# Patient Record
Sex: Female | Born: 1935
Health system: Southern US, Community
[De-identification: ages and names within clinical notes are randomized; demographics above are authoritative.]

## PROBLEM LIST (undated history)

## (undated) DIAGNOSIS — M549 Dorsalgia, unspecified: Secondary | ICD-10-CM

## (undated) DIAGNOSIS — G709 Myoneural disorder, unspecified: Secondary | ICD-10-CM

## (undated) DIAGNOSIS — I3139 Other pericardial effusion (noninflammatory): Secondary | ICD-10-CM

## (undated) DIAGNOSIS — I4891 Unspecified atrial fibrillation: Secondary | ICD-10-CM

## (undated) DIAGNOSIS — I10 Essential (primary) hypertension: Secondary | ICD-10-CM

## (undated) DIAGNOSIS — I313 Pericardial effusion (noninflammatory): Secondary | ICD-10-CM

## (undated) DIAGNOSIS — D649 Anemia, unspecified: Secondary | ICD-10-CM

## (undated) DIAGNOSIS — H9190 Unspecified hearing loss, unspecified ear: Secondary | ICD-10-CM

## (undated) DIAGNOSIS — I2510 Atherosclerotic heart disease of native coronary artery without angina pectoris: Secondary | ICD-10-CM

## (undated) DIAGNOSIS — F419 Anxiety disorder, unspecified: Secondary | ICD-10-CM

## (undated) DIAGNOSIS — K219 Gastro-esophageal reflux disease without esophagitis: Secondary | ICD-10-CM

## (undated) DIAGNOSIS — G8929 Other chronic pain: Secondary | ICD-10-CM

## (undated) DIAGNOSIS — E785 Hyperlipidemia, unspecified: Secondary | ICD-10-CM

## (undated) DIAGNOSIS — Z950 Presence of cardiac pacemaker: Secondary | ICD-10-CM

## (undated) DIAGNOSIS — Z8719 Personal history of other diseases of the digestive system: Secondary | ICD-10-CM

## (undated) DIAGNOSIS — I495 Sick sinus syndrome: Secondary | ICD-10-CM

## (undated) DIAGNOSIS — J449 Chronic obstructive pulmonary disease, unspecified: Secondary | ICD-10-CM

## (undated) DIAGNOSIS — R51 Headache: Secondary | ICD-10-CM

## (undated) DIAGNOSIS — I241 Dressler's syndrome: Secondary | ICD-10-CM

## (undated) HISTORY — DX: Chronic obstructive pulmonary disease, unspecified: J44.9

## (undated) HISTORY — DX: Other chronic pain: G89.29

## (undated) HISTORY — DX: Anxiety disorder, unspecified: F41.9

## (undated) HISTORY — DX: Dressler's syndrome: I24.1

## (undated) HISTORY — DX: Atherosclerotic heart disease of native coronary artery without angina pectoris: I25.10

## (undated) HISTORY — DX: Pericardial effusion (noninflammatory): I31.3

## (undated) HISTORY — DX: Sick sinus syndrome: I49.5

## (undated) HISTORY — DX: Other pericardial effusion (noninflammatory): I31.39

## (undated) HISTORY — DX: Unspecified atrial fibrillation: I48.91

## (undated) HISTORY — PX: CHOLECYSTECTOMY: SHX55

## (undated) HISTORY — DX: Gastro-esophageal reflux disease without esophagitis: K21.9

## (undated) HISTORY — PX: VAGINAL HYSTERECTOMY: SUR661

## (undated) HISTORY — PX: APPENDECTOMY: SHX54

## (undated) HISTORY — DX: Essential (primary) hypertension: I10

## (undated) HISTORY — DX: Hyperlipidemia, unspecified: E78.5

## (undated) HISTORY — DX: Other chronic pain: M54.9

## (undated) HISTORY — PX: CATARACT EXTRACTION: SUR2

---

## 2001-03-26 ENCOUNTER — Encounter: Payer: Self-pay | Admitting: Family Medicine

## 2001-03-26 ENCOUNTER — Ambulatory Visit (HOSPITAL_COMMUNITY): Admission: RE | Admit: 2001-03-26 | Discharge: 2001-03-26 | Payer: Self-pay | Admitting: Family Medicine

## 2001-12-29 ENCOUNTER — Ambulatory Visit (HOSPITAL_COMMUNITY): Admission: RE | Admit: 2001-12-29 | Discharge: 2001-12-29 | Payer: Self-pay | Admitting: Internal Medicine

## 2002-04-28 ENCOUNTER — Ambulatory Visit (HOSPITAL_COMMUNITY): Admission: RE | Admit: 2002-04-28 | Discharge: 2002-04-28 | Payer: Self-pay | Admitting: Family Medicine

## 2002-04-28 ENCOUNTER — Encounter: Payer: Self-pay | Admitting: Family Medicine

## 2002-05-25 ENCOUNTER — Ambulatory Visit (HOSPITAL_COMMUNITY): Admission: RE | Admit: 2002-05-25 | Discharge: 2002-05-25 | Payer: Self-pay | Admitting: Cardiology

## 2002-08-15 ENCOUNTER — Emergency Department (HOSPITAL_COMMUNITY): Admission: EM | Admit: 2002-08-15 | Discharge: 2002-08-15 | Payer: Self-pay

## 2003-05-10 ENCOUNTER — Ambulatory Visit (HOSPITAL_COMMUNITY): Admission: RE | Admit: 2003-05-10 | Discharge: 2003-05-10 | Payer: Self-pay | Admitting: Internal Medicine

## 2003-06-14 ENCOUNTER — Ambulatory Visit (HOSPITAL_COMMUNITY): Admission: RE | Admit: 2003-06-14 | Discharge: 2003-06-14 | Payer: Self-pay | Admitting: Internal Medicine

## 2003-11-27 ENCOUNTER — Ambulatory Visit (HOSPITAL_COMMUNITY): Admission: RE | Admit: 2003-11-27 | Discharge: 2003-11-27 | Payer: Self-pay | Admitting: Internal Medicine

## 2004-06-10 ENCOUNTER — Inpatient Hospital Stay (HOSPITAL_COMMUNITY): Admission: AD | Admit: 2004-06-10 | Discharge: 2004-06-12 | Payer: Self-pay | Admitting: Family Medicine

## 2004-06-11 ENCOUNTER — Ambulatory Visit: Payer: Self-pay | Admitting: *Deleted

## 2004-06-26 ENCOUNTER — Ambulatory Visit: Payer: Self-pay | Admitting: *Deleted

## 2004-07-02 ENCOUNTER — Ambulatory Visit: Payer: Self-pay | Admitting: *Deleted

## 2004-07-03 ENCOUNTER — Ambulatory Visit: Payer: Self-pay | Admitting: *Deleted

## 2004-07-04 ENCOUNTER — Ambulatory Visit (HOSPITAL_COMMUNITY): Admission: RE | Admit: 2004-07-04 | Discharge: 2004-07-04 | Payer: Self-pay | Admitting: Family Medicine

## 2004-07-05 ENCOUNTER — Ambulatory Visit: Payer: Self-pay | Admitting: Internal Medicine

## 2004-07-10 ENCOUNTER — Ambulatory Visit (HOSPITAL_COMMUNITY): Admission: RE | Admit: 2004-07-10 | Discharge: 2004-07-10 | Payer: Self-pay | Admitting: Internal Medicine

## 2004-08-22 ENCOUNTER — Encounter
Admission: RE | Admit: 2004-08-22 | Discharge: 2004-11-20 | Payer: Self-pay | Admitting: Physical Medicine & Rehabilitation

## 2004-08-26 ENCOUNTER — Ambulatory Visit: Payer: Self-pay | Admitting: Physical Medicine & Rehabilitation

## 2004-08-26 ENCOUNTER — Ambulatory Visit: Payer: Self-pay | Admitting: *Deleted

## 2004-09-05 ENCOUNTER — Ambulatory Visit: Payer: Self-pay | Admitting: Internal Medicine

## 2004-09-25 ENCOUNTER — Ambulatory Visit (HOSPITAL_COMMUNITY): Admission: RE | Admit: 2004-09-25 | Discharge: 2004-09-25 | Payer: Self-pay | Admitting: Neurology

## 2004-10-11 ENCOUNTER — Ambulatory Visit (HOSPITAL_COMMUNITY): Admission: RE | Admit: 2004-10-11 | Discharge: 2004-10-11 | Payer: Self-pay | Admitting: Family Medicine

## 2004-10-14 ENCOUNTER — Ambulatory Visit (HOSPITAL_COMMUNITY): Admission: RE | Admit: 2004-10-14 | Discharge: 2004-10-14 | Payer: Self-pay | Admitting: Internal Medicine

## 2004-10-14 ENCOUNTER — Ambulatory Visit: Payer: Self-pay | Admitting: Internal Medicine

## 2004-10-30 ENCOUNTER — Emergency Department (HOSPITAL_COMMUNITY): Admission: EM | Admit: 2004-10-30 | Discharge: 2004-10-30 | Payer: Self-pay | Admitting: Emergency Medicine

## 2004-11-20 ENCOUNTER — Encounter
Admission: RE | Admit: 2004-11-20 | Discharge: 2005-02-18 | Payer: Self-pay | Admitting: Physical Medicine & Rehabilitation

## 2004-11-22 ENCOUNTER — Ambulatory Visit: Payer: Self-pay | Admitting: Physical Medicine & Rehabilitation

## 2004-12-16 ENCOUNTER — Ambulatory Visit (HOSPITAL_COMMUNITY): Admission: RE | Admit: 2004-12-16 | Discharge: 2004-12-16 | Payer: Self-pay | Admitting: Family Medicine

## 2004-12-23 ENCOUNTER — Ambulatory Visit (HOSPITAL_COMMUNITY): Admission: RE | Admit: 2004-12-23 | Discharge: 2004-12-23 | Payer: Self-pay | Admitting: Family Medicine

## 2004-12-23 ENCOUNTER — Encounter: Payer: Self-pay | Admitting: Orthopedic Surgery

## 2005-01-09 ENCOUNTER — Ambulatory Visit: Payer: Self-pay | Admitting: Internal Medicine

## 2005-01-20 ENCOUNTER — Ambulatory Visit (HOSPITAL_COMMUNITY): Admission: RE | Admit: 2005-01-20 | Discharge: 2005-01-20 | Payer: Self-pay | Admitting: Internal Medicine

## 2005-01-20 ENCOUNTER — Ambulatory Visit: Payer: Self-pay | Admitting: Internal Medicine

## 2005-03-07 ENCOUNTER — Ambulatory Visit: Payer: Self-pay | Admitting: *Deleted

## 2005-03-11 ENCOUNTER — Ambulatory Visit: Payer: Self-pay | Admitting: Internal Medicine

## 2005-03-11 ENCOUNTER — Ambulatory Visit (HOSPITAL_COMMUNITY): Admission: RE | Admit: 2005-03-11 | Discharge: 2005-03-11 | Payer: Self-pay | Admitting: Family Medicine

## 2005-03-12 ENCOUNTER — Emergency Department (HOSPITAL_COMMUNITY): Admission: EM | Admit: 2005-03-12 | Discharge: 2005-03-12 | Payer: Self-pay | Admitting: Emergency Medicine

## 2005-03-12 ENCOUNTER — Ambulatory Visit (HOSPITAL_COMMUNITY): Admission: RE | Admit: 2005-03-12 | Discharge: 2005-03-12 | Payer: Self-pay | Admitting: Internal Medicine

## 2005-04-18 ENCOUNTER — Ambulatory Visit (HOSPITAL_COMMUNITY): Admission: RE | Admit: 2005-04-18 | Discharge: 2005-04-18 | Payer: Self-pay | Admitting: Family Medicine

## 2005-05-05 ENCOUNTER — Ambulatory Visit: Payer: Self-pay | Admitting: Internal Medicine

## 2005-07-10 ENCOUNTER — Ambulatory Visit: Payer: Self-pay | Admitting: Internal Medicine

## 2005-07-23 ENCOUNTER — Ambulatory Visit: Payer: Self-pay | Admitting: Internal Medicine

## 2005-08-05 ENCOUNTER — Ambulatory Visit: Payer: Self-pay | Admitting: *Deleted

## 2005-08-07 ENCOUNTER — Ambulatory Visit: Payer: Self-pay | Admitting: *Deleted

## 2005-08-07 ENCOUNTER — Encounter (HOSPITAL_COMMUNITY): Admission: RE | Admit: 2005-08-07 | Discharge: 2005-09-06 | Payer: Self-pay | Admitting: *Deleted

## 2005-08-13 ENCOUNTER — Ambulatory Visit: Payer: Self-pay | Admitting: *Deleted

## 2005-10-21 ENCOUNTER — Ambulatory Visit (HOSPITAL_COMMUNITY): Admission: RE | Admit: 2005-10-21 | Discharge: 2005-10-21 | Payer: Self-pay | Admitting: Family Medicine

## 2005-12-23 ENCOUNTER — Ambulatory Visit: Payer: Self-pay | Admitting: Internal Medicine

## 2006-01-27 ENCOUNTER — Ambulatory Visit (HOSPITAL_COMMUNITY): Admission: RE | Admit: 2006-01-27 | Discharge: 2006-01-27 | Payer: Self-pay | Admitting: Internal Medicine

## 2006-01-30 ENCOUNTER — Ambulatory Visit: Payer: Self-pay | Admitting: *Deleted

## 2006-02-03 ENCOUNTER — Inpatient Hospital Stay (HOSPITAL_BASED_OUTPATIENT_CLINIC_OR_DEPARTMENT_OTHER): Admission: RE | Admit: 2006-02-03 | Discharge: 2006-02-03 | Payer: Self-pay | Admitting: Cardiology

## 2006-02-03 ENCOUNTER — Ambulatory Visit: Payer: Self-pay | Admitting: Cardiology

## 2006-02-19 ENCOUNTER — Ambulatory Visit: Payer: Self-pay | Admitting: *Deleted

## 2006-02-20 ENCOUNTER — Ambulatory Visit: Payer: Self-pay | Admitting: *Deleted

## 2006-02-20 ENCOUNTER — Ambulatory Visit (HOSPITAL_COMMUNITY): Admission: RE | Admit: 2006-02-20 | Discharge: 2006-02-20 | Payer: Self-pay | Admitting: *Deleted

## 2006-03-05 ENCOUNTER — Ambulatory Visit: Payer: Self-pay | Admitting: *Deleted

## 2006-03-26 ENCOUNTER — Ambulatory Visit: Payer: Self-pay | Admitting: Internal Medicine

## 2006-04-07 ENCOUNTER — Ambulatory Visit (HOSPITAL_COMMUNITY): Admission: RE | Admit: 2006-04-07 | Discharge: 2006-04-07 | Payer: Self-pay | Admitting: Internal Medicine

## 2006-04-07 ENCOUNTER — Ambulatory Visit: Payer: Self-pay | Admitting: Internal Medicine

## 2006-04-29 ENCOUNTER — Ambulatory Visit: Payer: Self-pay | Admitting: Cardiology

## 2006-06-12 ENCOUNTER — Ambulatory Visit (HOSPITAL_COMMUNITY): Admission: RE | Admit: 2006-06-12 | Discharge: 2006-06-12 | Payer: Self-pay | Admitting: Internal Medicine

## 2006-07-08 ENCOUNTER — Ambulatory Visit (HOSPITAL_COMMUNITY): Admission: RE | Admit: 2006-07-08 | Discharge: 2006-07-08 | Payer: Self-pay | Admitting: Pulmonary Disease

## 2006-07-09 ENCOUNTER — Ambulatory Visit: Payer: Self-pay | Admitting: Internal Medicine

## 2006-08-06 ENCOUNTER — Ambulatory Visit (HOSPITAL_COMMUNITY): Admission: RE | Admit: 2006-08-06 | Discharge: 2006-08-06 | Payer: Self-pay | Admitting: Ophthalmology

## 2006-09-24 ENCOUNTER — Ambulatory Visit: Payer: Self-pay | Admitting: Cardiology

## 2006-10-07 ENCOUNTER — Encounter: Payer: Self-pay | Admitting: Cardiology

## 2006-10-07 ENCOUNTER — Ambulatory Visit: Payer: Self-pay | Admitting: Cardiology

## 2006-10-14 ENCOUNTER — Ambulatory Visit: Payer: Self-pay | Admitting: Cardiology

## 2007-01-07 ENCOUNTER — Ambulatory Visit: Payer: Self-pay | Admitting: Internal Medicine

## 2007-02-08 ENCOUNTER — Ambulatory Visit (HOSPITAL_COMMUNITY): Admission: RE | Admit: 2007-02-08 | Discharge: 2007-02-08 | Payer: Self-pay | Admitting: Internal Medicine

## 2007-02-17 ENCOUNTER — Ambulatory Visit: Payer: Self-pay | Admitting: Cardiology

## 2007-04-01 ENCOUNTER — Ambulatory Visit (HOSPITAL_COMMUNITY): Admission: RE | Admit: 2007-04-01 | Discharge: 2007-04-01 | Payer: Self-pay | Admitting: Family Medicine

## 2007-04-12 ENCOUNTER — Ambulatory Visit: Payer: Self-pay | Admitting: Cardiology

## 2007-07-21 ENCOUNTER — Emergency Department (HOSPITAL_COMMUNITY): Admission: EM | Admit: 2007-07-21 | Discharge: 2007-07-21 | Payer: Self-pay | Admitting: Emergency Medicine

## 2007-09-01 ENCOUNTER — Ambulatory Visit (HOSPITAL_COMMUNITY): Admission: RE | Admit: 2007-09-01 | Discharge: 2007-09-01 | Payer: Self-pay | Admitting: Family Medicine

## 2007-09-23 ENCOUNTER — Ambulatory Visit (HOSPITAL_COMMUNITY): Admission: RE | Admit: 2007-09-23 | Discharge: 2007-09-23 | Payer: Self-pay | Admitting: Internal Medicine

## 2007-10-08 ENCOUNTER — Ambulatory Visit: Payer: Self-pay | Admitting: Cardiology

## 2008-02-21 ENCOUNTER — Ambulatory Visit: Payer: Self-pay

## 2008-03-15 ENCOUNTER — Ambulatory Visit: Payer: Self-pay | Admitting: Cardiology

## 2008-08-04 HISTORY — PX: INSERT / REPLACE / REMOVE PACEMAKER: SUR710

## 2008-08-04 HISTORY — PX: CARDIAC CATHETERIZATION: SHX172

## 2008-08-28 ENCOUNTER — Ambulatory Visit (HOSPITAL_COMMUNITY): Admission: RE | Admit: 2008-08-28 | Discharge: 2008-08-28 | Payer: Self-pay | Admitting: Internal Medicine

## 2008-09-21 ENCOUNTER — Ambulatory Visit: Payer: Self-pay | Admitting: Cardiology

## 2008-09-28 ENCOUNTER — Encounter (INDEPENDENT_AMBULATORY_CARE_PROVIDER_SITE_OTHER): Payer: Self-pay | Admitting: *Deleted

## 2008-09-28 LAB — CONVERTED CEMR LAB
Albumin: 4.3 g/dL
Alkaline Phosphatase: 97 units/L
Cholesterol: 258 mg/dL
HDL: 43 mg/dL
Total Protein: 7.6 g/dL
Triglycerides: 115 mg/dL

## 2008-10-13 ENCOUNTER — Ambulatory Visit: Payer: Self-pay | Admitting: Cardiology

## 2008-10-20 ENCOUNTER — Ambulatory Visit: Payer: Self-pay | Admitting: Cardiology

## 2008-11-28 ENCOUNTER — Inpatient Hospital Stay (HOSPITAL_COMMUNITY): Admission: EM | Admit: 2008-11-28 | Discharge: 2008-11-30 | Payer: Self-pay | Admitting: Emergency Medicine

## 2008-11-28 ENCOUNTER — Ambulatory Visit: Payer: Self-pay | Admitting: Cardiology

## 2008-11-29 ENCOUNTER — Encounter (INDEPENDENT_AMBULATORY_CARE_PROVIDER_SITE_OTHER): Payer: Self-pay | Admitting: Internal Medicine

## 2008-12-04 ENCOUNTER — Ambulatory Visit: Payer: Self-pay | Admitting: Cardiology

## 2008-12-08 ENCOUNTER — Ambulatory Visit: Payer: Self-pay | Admitting: Cardiology

## 2008-12-08 ENCOUNTER — Encounter: Payer: Self-pay | Admitting: Physician Assistant

## 2008-12-10 ENCOUNTER — Emergency Department (HOSPITAL_COMMUNITY): Admission: EM | Admit: 2008-12-10 | Discharge: 2008-12-10 | Payer: Self-pay | Admitting: Emergency Medicine

## 2008-12-19 DIAGNOSIS — I4819 Other persistent atrial fibrillation: Secondary | ICD-10-CM | POA: Insufficient documentation

## 2008-12-19 DIAGNOSIS — E7849 Other hyperlipidemia: Secondary | ICD-10-CM | POA: Insufficient documentation

## 2008-12-19 DIAGNOSIS — I4891 Unspecified atrial fibrillation: Secondary | ICD-10-CM | POA: Insufficient documentation

## 2008-12-19 DIAGNOSIS — E782 Mixed hyperlipidemia: Secondary | ICD-10-CM | POA: Insufficient documentation

## 2008-12-19 DIAGNOSIS — E785 Hyperlipidemia, unspecified: Secondary | ICD-10-CM

## 2008-12-19 DIAGNOSIS — I482 Chronic atrial fibrillation, unspecified: Secondary | ICD-10-CM | POA: Insufficient documentation

## 2008-12-19 DIAGNOSIS — F419 Anxiety disorder, unspecified: Secondary | ICD-10-CM | POA: Insufficient documentation

## 2008-12-19 DIAGNOSIS — F411 Generalized anxiety disorder: Secondary | ICD-10-CM

## 2008-12-19 DIAGNOSIS — K219 Gastro-esophageal reflux disease without esophagitis: Secondary | ICD-10-CM

## 2008-12-19 DIAGNOSIS — R259 Unspecified abnormal involuntary movements: Secondary | ICD-10-CM | POA: Insufficient documentation

## 2008-12-21 ENCOUNTER — Ambulatory Visit: Payer: Self-pay | Admitting: Internal Medicine

## 2008-12-22 LAB — CONVERTED CEMR LAB
BUN: 10 mg/dL (ref 6–23)
Basophils Absolute: 0 10*3/uL (ref 0.0–0.1)
Basophils Relative: 0.7 % (ref 0.0–3.0)
CO2: 30 meq/L (ref 19–32)
Calcium: 9.4 mg/dL (ref 8.4–10.5)
Chloride: 103 meq/L (ref 96–112)
Creatinine, Ser: 0.7 mg/dL (ref 0.4–1.2)
Eosinophils Absolute: 0.1 10*3/uL (ref 0.0–0.7)
Eosinophils Relative: 1.1 % (ref 0.0–5.0)
GFR calc non Af Amer: 87.62 mL/min (ref >60)
Glucose, Bld: 103 mg/dL — ABNORMAL HIGH (ref 70–99)
HCT: 35.8 % — ABNORMAL LOW (ref 36.0–46.0)
Hemoglobin: 12.2 g/dL (ref 12.0–15.0)
INR: 1.1 — ABNORMAL HIGH (ref 0.8–1.0)
Lymphocytes Relative: 23.8 % (ref 12.0–46.0)
Lymphs Abs: 1.6 10*3/uL (ref 0.7–4.0)
MCHC: 34.2 g/dL (ref 30.0–36.0)
MCV: 88.9 fL (ref 78.0–100.0)
Monocytes Absolute: 0.6 10*3/uL (ref 0.1–1.0)
Monocytes Relative: 9.5 % (ref 3.0–12.0)
Neutro Abs: 4.5 10*3/uL (ref 1.4–7.7)
Neutrophils Relative %: 64.9 % (ref 43.0–77.0)
Platelets: 227 10*3/uL (ref 150.0–400.0)
Potassium: 3.8 meq/L (ref 3.5–5.1)
Prothrombin Time: 11.8 s (ref 10.9–13.3)
RBC: 4.02 M/uL (ref 3.87–5.11)
RDW: 12.4 % (ref 11.5–14.6)
Sodium: 139 meq/L (ref 135–145)
WBC: 6.8 10*3/uL (ref 4.5–10.5)
aPTT: 29.6 s — ABNORMAL HIGH (ref 21.7–28.8)

## 2008-12-27 ENCOUNTER — Observation Stay (HOSPITAL_COMMUNITY): Admission: RE | Admit: 2008-12-27 | Discharge: 2008-12-28 | Payer: Self-pay | Admitting: Internal Medicine

## 2008-12-27 ENCOUNTER — Ambulatory Visit: Payer: Self-pay | Admitting: Internal Medicine

## 2008-12-28 ENCOUNTER — Encounter: Payer: Self-pay | Admitting: Internal Medicine

## 2009-01-04 ENCOUNTER — Encounter: Payer: Self-pay | Admitting: Physician Assistant

## 2009-01-04 ENCOUNTER — Encounter: Payer: Self-pay | Admitting: Cardiology

## 2009-01-04 ENCOUNTER — Ambulatory Visit: Payer: Self-pay | Admitting: Cardiology

## 2009-01-04 LAB — CONVERTED CEMR LAB
CO2: 25 meq/L
Chloride: 103 meq/L
MCV: 90.9 fL
Platelets: 240 10*3/uL
Sodium: 141 meq/L

## 2009-01-05 ENCOUNTER — Emergency Department (HOSPITAL_COMMUNITY): Admission: EM | Admit: 2009-01-05 | Discharge: 2009-01-05 | Payer: Self-pay | Admitting: Emergency Medicine

## 2009-01-05 ENCOUNTER — Encounter: Payer: Self-pay | Admitting: Internal Medicine

## 2009-01-12 ENCOUNTER — Encounter: Payer: Self-pay | Admitting: Internal Medicine

## 2009-01-12 ENCOUNTER — Ambulatory Visit: Payer: Self-pay | Admitting: Cardiology

## 2009-01-17 ENCOUNTER — Emergency Department (HOSPITAL_COMMUNITY): Admission: EM | Admit: 2009-01-17 | Discharge: 2009-01-17 | Payer: Self-pay | Admitting: Internal Medicine

## 2009-01-26 ENCOUNTER — Encounter: Payer: Self-pay | Admitting: Cardiology

## 2009-01-26 ENCOUNTER — Ambulatory Visit (HOSPITAL_COMMUNITY): Admission: RE | Admit: 2009-01-26 | Discharge: 2009-01-26 | Payer: Self-pay | Admitting: Internal Medicine

## 2009-01-26 ENCOUNTER — Ambulatory Visit: Payer: Self-pay | Admitting: Cardiology

## 2009-01-26 DIAGNOSIS — I495 Sick sinus syndrome: Secondary | ICD-10-CM | POA: Insufficient documentation

## 2009-03-12 ENCOUNTER — Telehealth (INDEPENDENT_AMBULATORY_CARE_PROVIDER_SITE_OTHER): Payer: Self-pay | Admitting: *Deleted

## 2009-03-16 ENCOUNTER — Encounter: Payer: Self-pay | Admitting: Internal Medicine

## 2009-03-16 ENCOUNTER — Ambulatory Visit: Payer: Self-pay

## 2009-03-30 ENCOUNTER — Ambulatory Visit (HOSPITAL_COMMUNITY): Admission: RE | Admit: 2009-03-30 | Discharge: 2009-03-30 | Payer: Self-pay | Admitting: Internal Medicine

## 2009-04-04 ENCOUNTER — Ambulatory Visit: Payer: Self-pay | Admitting: Internal Medicine

## 2009-04-04 DIAGNOSIS — Z950 Presence of cardiac pacemaker: Secondary | ICD-10-CM

## 2009-04-18 ENCOUNTER — Ambulatory Visit: Payer: Self-pay | Admitting: Cardiology

## 2009-04-18 DIAGNOSIS — I2510 Atherosclerotic heart disease of native coronary artery without angina pectoris: Secondary | ICD-10-CM | POA: Insufficient documentation

## 2009-04-18 DIAGNOSIS — I25110 Atherosclerotic heart disease of native coronary artery with unstable angina pectoris: Secondary | ICD-10-CM | POA: Insufficient documentation

## 2009-04-18 DIAGNOSIS — I10 Essential (primary) hypertension: Secondary | ICD-10-CM | POA: Insufficient documentation

## 2009-04-18 DIAGNOSIS — I25119 Atherosclerotic heart disease of native coronary artery with unspecified angina pectoris: Secondary | ICD-10-CM

## 2009-05-02 ENCOUNTER — Telehealth (INDEPENDENT_AMBULATORY_CARE_PROVIDER_SITE_OTHER): Payer: Self-pay

## 2009-05-11 ENCOUNTER — Encounter: Payer: Self-pay | Admitting: Adult Health

## 2009-05-11 ENCOUNTER — Encounter (INDEPENDENT_AMBULATORY_CARE_PROVIDER_SITE_OTHER): Payer: Self-pay | Admitting: *Deleted

## 2009-05-11 ENCOUNTER — Ambulatory Visit: Payer: Self-pay | Admitting: Cardiovascular Disease

## 2009-05-11 LAB — CONVERTED CEMR LAB
CO2: 27 meq/L
Calcium: 9.8 mg/dL
Creatinine, Ser: 0.86 mg/dL
INR: 0.9
Sodium: 139 meq/L
aPTT: 29 s

## 2009-05-14 ENCOUNTER — Ambulatory Visit: Payer: Self-pay | Admitting: Cardiovascular Disease

## 2009-05-14 ENCOUNTER — Inpatient Hospital Stay (HOSPITAL_COMMUNITY): Admission: RE | Admit: 2009-05-14 | Discharge: 2009-05-14 | Payer: Self-pay | Admitting: Cardiovascular Disease

## 2009-05-16 ENCOUNTER — Inpatient Hospital Stay (HOSPITAL_COMMUNITY): Admission: EM | Admit: 2009-05-16 | Discharge: 2009-05-18 | Payer: Self-pay | Admitting: Internal Medicine

## 2009-05-16 ENCOUNTER — Ambulatory Visit: Payer: Self-pay | Admitting: Internal Medicine

## 2009-05-25 ENCOUNTER — Telehealth (INDEPENDENT_AMBULATORY_CARE_PROVIDER_SITE_OTHER): Payer: Self-pay | Admitting: *Deleted

## 2009-05-25 ENCOUNTER — Inpatient Hospital Stay (HOSPITAL_COMMUNITY): Admission: EM | Admit: 2009-05-25 | Discharge: 2009-05-31 | Payer: Self-pay | Admitting: Cardiovascular Disease

## 2009-05-25 ENCOUNTER — Ambulatory Visit: Payer: Self-pay | Admitting: Cardiology

## 2009-06-04 HISTORY — PX: OTHER SURGICAL HISTORY: SHX169

## 2009-06-05 ENCOUNTER — Ambulatory Visit: Payer: Self-pay | Admitting: Cardiology

## 2009-06-11 ENCOUNTER — Inpatient Hospital Stay (HOSPITAL_COMMUNITY): Admission: EM | Admit: 2009-06-11 | Discharge: 2009-06-17 | Payer: Self-pay | Admitting: Emergency Medicine

## 2009-06-11 ENCOUNTER — Ambulatory Visit: Payer: Self-pay | Admitting: Cardiology

## 2009-06-12 ENCOUNTER — Encounter: Payer: Self-pay | Admitting: Cardiology

## 2009-06-12 ENCOUNTER — Ambulatory Visit: Payer: Self-pay | Admitting: Cardiothoracic Surgery

## 2009-06-12 ENCOUNTER — Encounter: Payer: Self-pay | Admitting: Cardiothoracic Surgery

## 2009-06-13 ENCOUNTER — Encounter: Payer: Self-pay | Admitting: Cardiology

## 2009-06-15 ENCOUNTER — Encounter: Payer: Self-pay | Admitting: Cardiology

## 2009-06-16 ENCOUNTER — Encounter: Payer: Self-pay | Admitting: Cardiology

## 2009-06-20 ENCOUNTER — Telehealth: Payer: Self-pay | Admitting: Cardiology

## 2009-06-22 ENCOUNTER — Ambulatory Visit: Payer: Self-pay | Admitting: Cardiothoracic Surgery

## 2009-06-30 ENCOUNTER — Encounter: Payer: Self-pay | Admitting: Emergency Medicine

## 2009-07-01 ENCOUNTER — Inpatient Hospital Stay (HOSPITAL_COMMUNITY): Admission: EM | Admit: 2009-07-01 | Discharge: 2009-07-03 | Payer: Self-pay | Admitting: Cardiology

## 2009-07-01 ENCOUNTER — Ambulatory Visit: Payer: Self-pay | Admitting: Cardiology

## 2009-07-01 ENCOUNTER — Encounter (INDEPENDENT_AMBULATORY_CARE_PROVIDER_SITE_OTHER): Payer: Self-pay | Admitting: Cardiology

## 2009-07-06 ENCOUNTER — Telehealth (INDEPENDENT_AMBULATORY_CARE_PROVIDER_SITE_OTHER): Payer: Self-pay | Admitting: *Deleted

## 2009-07-09 ENCOUNTER — Encounter: Admission: RE | Admit: 2009-07-09 | Discharge: 2009-07-09 | Payer: Self-pay | Admitting: Cardiothoracic Surgery

## 2009-07-09 ENCOUNTER — Ambulatory Visit: Payer: Self-pay | Admitting: Cardiothoracic Surgery

## 2009-07-10 ENCOUNTER — Ambulatory Visit: Payer: Self-pay | Admitting: Cardiology

## 2009-07-10 ENCOUNTER — Encounter (INDEPENDENT_AMBULATORY_CARE_PROVIDER_SITE_OTHER): Payer: Self-pay | Admitting: *Deleted

## 2009-07-10 ENCOUNTER — Encounter: Payer: Self-pay | Admitting: Cardiology

## 2009-07-10 DIAGNOSIS — I319 Disease of pericardium, unspecified: Secondary | ICD-10-CM | POA: Insufficient documentation

## 2009-08-04 HISTORY — PX: COLONOSCOPY: SHX174

## 2009-08-07 ENCOUNTER — Encounter (INDEPENDENT_AMBULATORY_CARE_PROVIDER_SITE_OTHER): Payer: Self-pay | Admitting: *Deleted

## 2009-08-07 LAB — CONVERTED CEMR LAB
Albumin: 3.8 g/dL
BUN: 11 mg/dL
Bilirubin, Direct: 0.1 mg/dL
CO2: 28 meq/L
CO2: 28 meq/L
Calcium: 9.5 mg/dL
Chloride: 104 meq/L
Cholesterol: 167 mg/dL
Creatinine, Ser: 0.71 mg/dL
Glucose, Bld: 83 mg/dL
Glucose, Bld: 83 mg/dL
Potassium: 4.1 meq/L
Sodium: 143 meq/L
Sodium: 143 meq/L
TSH: 0.964 microintl units/mL
Total Protein: 7.3 g/dL
Total Protein: 7.3 g/dL

## 2009-08-08 ENCOUNTER — Encounter (INDEPENDENT_AMBULATORY_CARE_PROVIDER_SITE_OTHER): Payer: Self-pay | Admitting: *Deleted

## 2009-08-09 ENCOUNTER — Encounter: Payer: Self-pay | Admitting: Cardiology

## 2009-08-09 ENCOUNTER — Ambulatory Visit (HOSPITAL_COMMUNITY): Admission: RE | Admit: 2009-08-09 | Discharge: 2009-08-09 | Payer: Self-pay | Admitting: Cardiology

## 2009-08-09 ENCOUNTER — Ambulatory Visit: Payer: Self-pay | Admitting: Cardiology

## 2009-08-10 ENCOUNTER — Telehealth (INDEPENDENT_AMBULATORY_CARE_PROVIDER_SITE_OTHER): Payer: Self-pay | Admitting: *Deleted

## 2009-08-13 ENCOUNTER — Inpatient Hospital Stay (HOSPITAL_COMMUNITY): Admission: EM | Admit: 2009-08-13 | Discharge: 2009-08-14 | Payer: Self-pay | Admitting: Emergency Medicine

## 2009-08-13 ENCOUNTER — Telehealth (INDEPENDENT_AMBULATORY_CARE_PROVIDER_SITE_OTHER): Payer: Self-pay | Admitting: *Deleted

## 2009-08-14 ENCOUNTER — Encounter: Payer: Self-pay | Admitting: Cardiology

## 2009-08-30 ENCOUNTER — Telehealth (INDEPENDENT_AMBULATORY_CARE_PROVIDER_SITE_OTHER): Payer: Self-pay | Admitting: *Deleted

## 2009-09-03 ENCOUNTER — Encounter (INDEPENDENT_AMBULATORY_CARE_PROVIDER_SITE_OTHER): Payer: Self-pay | Admitting: *Deleted

## 2009-09-13 ENCOUNTER — Encounter: Payer: Self-pay | Admitting: Orthopedic Surgery

## 2009-09-13 ENCOUNTER — Ambulatory Visit: Payer: Self-pay | Admitting: Cardiology

## 2009-09-25 ENCOUNTER — Ambulatory Visit (HOSPITAL_COMMUNITY): Admission: RE | Admit: 2009-09-25 | Discharge: 2009-09-25 | Payer: Self-pay | Admitting: Internal Medicine

## 2009-09-26 ENCOUNTER — Encounter: Payer: Self-pay | Admitting: Cardiology

## 2009-10-19 ENCOUNTER — Telehealth (INDEPENDENT_AMBULATORY_CARE_PROVIDER_SITE_OTHER): Payer: Self-pay | Admitting: *Deleted

## 2009-11-24 ENCOUNTER — Emergency Department (HOSPITAL_COMMUNITY): Admission: EM | Admit: 2009-11-24 | Discharge: 2009-11-24 | Payer: Self-pay | Admitting: Emergency Medicine

## 2009-12-06 ENCOUNTER — Telehealth (INDEPENDENT_AMBULATORY_CARE_PROVIDER_SITE_OTHER): Payer: Self-pay

## 2009-12-07 ENCOUNTER — Emergency Department (HOSPITAL_COMMUNITY)
Admission: EM | Admit: 2009-12-07 | Discharge: 2009-12-07 | Payer: Self-pay | Source: Home / Self Care | Admitting: Emergency Medicine

## 2009-12-19 ENCOUNTER — Ambulatory Visit: Payer: Self-pay | Admitting: Cardiology

## 2009-12-19 ENCOUNTER — Encounter: Payer: Self-pay | Admitting: Internal Medicine

## 2009-12-26 ENCOUNTER — Encounter: Payer: Self-pay | Admitting: Cardiology

## 2010-01-07 ENCOUNTER — Ambulatory Visit (HOSPITAL_COMMUNITY): Admission: RE | Admit: 2010-01-07 | Discharge: 2010-01-07 | Payer: Self-pay | Admitting: Internal Medicine

## 2010-03-11 ENCOUNTER — Telehealth (INDEPENDENT_AMBULATORY_CARE_PROVIDER_SITE_OTHER): Payer: Self-pay | Admitting: *Deleted

## 2010-03-11 ENCOUNTER — Emergency Department (HOSPITAL_COMMUNITY): Admission: EM | Admit: 2010-03-11 | Discharge: 2010-03-11 | Payer: Self-pay | Admitting: Emergency Medicine

## 2010-03-13 ENCOUNTER — Ambulatory Visit: Payer: Self-pay | Admitting: Cardiology

## 2010-03-13 ENCOUNTER — Encounter (INDEPENDENT_AMBULATORY_CARE_PROVIDER_SITE_OTHER): Payer: Self-pay | Admitting: *Deleted

## 2010-03-14 ENCOUNTER — Ambulatory Visit: Payer: Self-pay | Admitting: Internal Medicine

## 2010-03-15 ENCOUNTER — Encounter: Payer: Self-pay | Admitting: Cardiology

## 2010-03-15 ENCOUNTER — Telehealth (INDEPENDENT_AMBULATORY_CARE_PROVIDER_SITE_OTHER): Payer: Self-pay

## 2010-04-22 ENCOUNTER — Encounter (INDEPENDENT_AMBULATORY_CARE_PROVIDER_SITE_OTHER): Payer: Self-pay | Admitting: *Deleted

## 2010-04-22 LAB — CONVERTED CEMR LAB
ALT: 9 U/L
AST: 21 U/L
Albumin: 4.1 g/dL
Alkaline Phosphatase: 71 U/L
BUN: 16 mg/dL
CO2: 28 meq/L
Calcium: 9.5 mg/dL
Chloride: 103 meq/L
Cholesterol: 288 mg/dL
Creatinine, Ser: 0.76 mg/dL
GFR calc non Af Amer: >60 mL/min
Glomerular Filtration Rate, Af Am: >60 mL/min/1.73m2
Glucose, Bld: 73 mg/dL
HDL: 50 mg/dL
LDL Cholesterol: 179 mg/dL
Potassium: 4.2 meq/L
Sodium: 141 meq/L
Total Protein: 7.6 g/dL
Triglycerides: 297 mg/dL

## 2010-04-24 ENCOUNTER — Encounter (INDEPENDENT_AMBULATORY_CARE_PROVIDER_SITE_OTHER): Payer: Self-pay | Admitting: *Deleted

## 2010-05-08 ENCOUNTER — Ambulatory Visit: Payer: Self-pay | Admitting: Orthopedic Surgery

## 2010-05-10 ENCOUNTER — Encounter: Payer: Self-pay | Admitting: Orthopedic Surgery

## 2010-05-20 ENCOUNTER — Ambulatory Visit: Payer: Self-pay | Admitting: Internal Medicine

## 2010-05-21 ENCOUNTER — Encounter: Payer: Self-pay | Admitting: Cardiology

## 2010-05-21 ENCOUNTER — Ambulatory Visit (HOSPITAL_COMMUNITY): Admission: RE | Admit: 2010-05-21 | Discharge: 2010-05-21 | Payer: Self-pay | Admitting: Ophthalmology

## 2010-05-30 ENCOUNTER — Ambulatory Visit (HOSPITAL_COMMUNITY)
Admission: RE | Admit: 2010-05-30 | Discharge: 2010-05-30 | Payer: Self-pay | Source: Home / Self Care | Admitting: Internal Medicine

## 2010-05-30 ENCOUNTER — Ambulatory Visit: Payer: Self-pay | Admitting: Internal Medicine

## 2010-06-13 ENCOUNTER — Ambulatory Visit: Payer: Self-pay | Admitting: Cardiology

## 2010-07-10 ENCOUNTER — Ambulatory Visit: Payer: Self-pay | Admitting: Orthopedic Surgery

## 2010-07-12 ENCOUNTER — Encounter (INDEPENDENT_AMBULATORY_CARE_PROVIDER_SITE_OTHER): Payer: Self-pay | Admitting: *Deleted

## 2010-07-15 ENCOUNTER — Ambulatory Visit (HOSPITAL_COMMUNITY)
Admission: RE | Admit: 2010-07-15 | Discharge: 2010-07-15 | Payer: Self-pay | Source: Home / Self Care | Attending: Orthopedic Surgery | Admitting: Orthopedic Surgery

## 2010-07-17 ENCOUNTER — Ambulatory Visit: Payer: Self-pay | Admitting: Orthopedic Surgery

## 2010-07-18 ENCOUNTER — Encounter (INDEPENDENT_AMBULATORY_CARE_PROVIDER_SITE_OTHER): Payer: Self-pay | Admitting: *Deleted

## 2010-07-19 ENCOUNTER — Encounter: Payer: Self-pay | Admitting: Orthopedic Surgery

## 2010-07-19 ENCOUNTER — Encounter: Payer: Self-pay | Admitting: Cardiology

## 2010-07-23 ENCOUNTER — Ambulatory Visit: Payer: Self-pay | Admitting: Orthopedic Surgery

## 2010-07-23 ENCOUNTER — Ambulatory Visit: Payer: Self-pay | Admitting: Internal Medicine

## 2010-07-31 ENCOUNTER — Encounter (INDEPENDENT_AMBULATORY_CARE_PROVIDER_SITE_OTHER): Payer: Self-pay | Admitting: *Deleted

## 2010-07-31 ENCOUNTER — Encounter: Payer: Self-pay | Admitting: Cardiology

## 2010-08-06 ENCOUNTER — Encounter: Payer: Self-pay | Admitting: Orthopedic Surgery

## 2010-08-08 ENCOUNTER — Encounter (INDEPENDENT_AMBULATORY_CARE_PROVIDER_SITE_OTHER): Payer: Self-pay | Admitting: *Deleted

## 2010-08-08 ENCOUNTER — Encounter: Payer: Self-pay | Admitting: Orthopedic Surgery

## 2010-08-09 ENCOUNTER — Encounter: Payer: Self-pay | Admitting: Orthopedic Surgery

## 2010-08-09 ENCOUNTER — Ambulatory Visit (HOSPITAL_COMMUNITY)
Admission: RE | Admit: 2010-08-09 | Discharge: 2010-08-10 | Payer: Self-pay | Source: Home / Self Care | Attending: Orthopedic Surgery | Admitting: Orthopedic Surgery

## 2010-08-09 LAB — CARDIAC PANEL(CRET KIN+CKTOT+MB+TROPI)
Relative Index: INVALID (ref 0.0–2.5)
Total CK: 85 U/L (ref 7–177)
Troponin I: 0.01 ng/mL (ref 0.00–0.06)

## 2010-08-09 LAB — COMPREHENSIVE METABOLIC PANEL
ALT: 24 U/L (ref 0–35)
AST: 49 U/L — ABNORMAL HIGH (ref 0–37)
Albumin: 3.1 g/dL — ABNORMAL LOW (ref 3.5–5.2)
Alkaline Phosphatase: 53 U/L (ref 39–117)
BUN: 9 mg/dL (ref 6–23)
CO2: 27 mEq/L (ref 19–32)
Calcium: 9 mg/dL (ref 8.4–10.5)
Chloride: 104 mEq/L (ref 96–112)
Creatinine, Ser: 0.7 mg/dL (ref 0.4–1.2)
GFR calc Af Amer: >60 mL/min (ref >60)
GFR calc non Af Amer: >60 mL/min (ref >60)
Glucose, Bld: 90 mg/dL (ref 70–99)
Potassium: 4 mEq/L (ref 3.5–5.1)
Sodium: 138 mEq/L (ref 135–145)
Total Bilirubin: 0.3 mg/dL (ref 0.3–1.2)
Total Protein: 6.1 g/dL (ref 6.0–8.3)

## 2010-08-09 LAB — BRAIN NATRIURETIC PEPTIDE: Pro B Natriuretic peptide (BNP): 111.1 pg/mL — ABNORMAL HIGH (ref 0.0–100.0)

## 2010-08-09 LAB — MAGNESIUM: Magnesium: 1.8 mg/dL (ref 1.5–2.5)

## 2010-08-12 ENCOUNTER — Ambulatory Visit
Admission: RE | Admit: 2010-08-12 | Discharge: 2010-08-12 | Payer: Self-pay | Source: Home / Self Care | Attending: Orthopedic Surgery | Admitting: Orthopedic Surgery

## 2010-08-19 LAB — CARDIAC PANEL(CRET KIN+CKTOT+MB+TROPI)
CK, MB: 18.1 ng/mL (ref 0.3–4.0)
CK, MB: 21.1 ng/mL (ref 0.3–4.0)
Relative Index: 4.3 — ABNORMAL HIGH (ref 0.0–2.5)
Relative Index: 4.2 — ABNORMAL HIGH (ref 0.0–2.5)
Total CK: 423 U/L — ABNORMAL HIGH (ref 7–177)
Total CK: 505 U/L — ABNORMAL HIGH (ref 7–177)
Troponin I: 0.01 ng/mL (ref 0.00–0.06)
Troponin I: 0.01 ng/mL (ref 0.00–0.06)

## 2010-08-19 LAB — GLUCOSE, CAPILLARY: Glucose-Capillary: 86 mg/dL (ref 70–99)

## 2010-08-23 ENCOUNTER — Encounter: Payer: Self-pay | Admitting: Orthopedic Surgery

## 2010-08-24 ENCOUNTER — Encounter: Payer: Self-pay | Admitting: Family Medicine

## 2010-08-24 ENCOUNTER — Encounter (INDEPENDENT_AMBULATORY_CARE_PROVIDER_SITE_OTHER): Payer: Self-pay | Admitting: Internal Medicine

## 2010-08-25 ENCOUNTER — Encounter: Payer: Self-pay | Admitting: Cardiothoracic Surgery

## 2010-08-27 ENCOUNTER — Ambulatory Visit
Admission: RE | Admit: 2010-08-27 | Discharge: 2010-08-27 | Payer: Self-pay | Source: Home / Self Care | Attending: Orthopedic Surgery | Admitting: Orthopedic Surgery

## 2010-08-28 ENCOUNTER — Encounter (HOSPITAL_COMMUNITY): Admission: RE | Admit: 2010-08-28 | Payer: Self-pay | Source: Home / Self Care | Admitting: Orthopedic Surgery

## 2010-08-29 ENCOUNTER — Encounter: Payer: Self-pay | Admitting: Orthopedic Surgery

## 2010-08-29 NOTE — Discharge Summary (Addendum)
  [name], [name]                  ACCOUNT NO.:  [id number]  MEDICAL RECORD NO.:  [id number]          PATIENT TYPE:  OIB  LOCATION:  A202                          FACILITY:  [facility]  PHYSICIAN:  Vickki Hearing, M.D.DATE OF BIRTH:  1935-11-25  DATE OF ADMISSION:  08/09/2010 DATE OF DISCHARGE:  01/07/2012LH                              DISCHARGE SUMMARY   ADMITTING DIAGNOSIS:  Rotator cuff tear, right shoulder.  DISCHARGE DIAGNOSIS:  Rotator cuff tear, right shoulder.  SECONDARY DIAGNOSIS:  Chest pain postoperatively.  ADMITTED BY:  Vickki Hearing, MD  DISCHARGE BY:  Vickki Hearing, MD  CONSULTATION:  Gerrit Friends. Dietrich Pates, MD, [facility]  HOSPITAL COURSE:  The patient was admitted for right rotator cuff repair.  She tolerated that well, but then in the PACU had postoperative chest pain.  She received sublingual nitroglycerin, did not seem to get much better, cardiac enzymes serially were normal in terms of the troponin level.  The CKs were elevated thought secondary to be to surgery.  She had stable vital signs throughout the entire course.  No change in the EKG.  She was kept on a monitor overnight with no obvious abnormalities on her rhythm strip, she is phased.  She is discharged with home health.  She has a followup scheduled with me on Monday, that time we will assess her need for further Cardiology followup.  She will be discharged on the following medications.  She will continue her; 1. Zofran 40 mg q.6 p.r.n. for nausea. 2. Amiodarone 200 mg daily. 3. Celexa 10 mg daily. 4. Alprazolam 0.5 mg q.8 p.r.n. 5. Metoprolol XL 50 mg b.i.d. 6. Crestor 20 mg every evening. 7. She is on Lasix 20 mg nightly and 40 mg in the morning. 8. She is on nitroglycerin 0.4 mg tabs sublingual q.5 p.r.n. x3 for     chest pain. 9. She is on Ultram 50 mg 1-2 q.6 p.r.n. 10.Enteric-coated aspirin 2 tablets t.i.d. 11.Plavix 75 mg daily. 12.Nexium 40 mg b.i.d. 13.Potassium 20 mEq  daily. 14.Folic acid 1 mg daily. 15.She is also on Norco 5 mg 1-2 q.4 p.r.n. for pain.  She is allergic to SULFA, ADHESIVE TAPE, AMITRIPTYLINE/PERPHENAZINE.   DICTATION ENDED AT THIS POINT.     Vickki Hearing, M.D.     SEH/MEDQ  D:  08/10/2010  T:  08/11/2010  Job:  [id number]  Electronically Signed by Fuller Canada M.D. on 08/29/2010 12:44:30 PM

## 2010-08-29 NOTE — Op Note (Addendum)
[name], [name]                  ACCOUNT NO.:  [id number]  MEDICAL RECORD NO.:  [id number]          PATIENT TYPE:  OIB  LOCATION:  A202                          FACILITY:  [facility]  PHYSICIAN:  Vickki Hearing, M.D.DATE OF BIRTH:  09-19-1935  DATE OF PROCEDURE:  08/09/2010 DATE OF DISCHARGE:                              OPERATIVE REPORT   PREOPERATIVE DIAGNOSIS:  Torn right rotator cuff.  POSTOPERATIVE DIAGNOSIS:  Torn right rotator cuff.  PROCEDURE:  Open right rotator cuff repair.  SURGEON:  Vickki Hearing, MD  ANESTHESIA:  General.  ASSISTED BY:  [name].  OPERATIVE FINDINGS:  Complete tear of the supraspinatus tendon with a U- shaped tear.  There was no acromial spur.  There was a large amount of subacromial bursitis.  HISTORY:  A 75 year old female who presented with shoulder pain.  She was treated nonoperatively, did not improve.  She went for an arthrogram because she had a pacemaker and it showed a complete tear of the supraspinatus tendon.  She was advised of her options and opted for surgical treatment.  Informed consent was done in the office.  The patient was identified as [name], the right shoulder was marked as the surgical site, countersigned by myself, the surgeon, and then the chart was updated.  She was then taken to the operating room, given a gram of Ancef.  She had general anesthetic with intubation that went smoothly.  After sterile prep and drape and completion of the time-out, the procedure began.  We first injected the subcutaneous tissue with dilute Marcaine and epinephrine solution.  We made a vertical incision in line with the anterolateral acromion and middle and anterior deltoid.  We divided the subcutaneous tissue down to the deltoid fascia which was then split up to the acromion and subperiosteally dissected from the acromion and the clavicle.  Deep retractors were placed, bursectomy was performed.  The acromion was  checked and found to be smooth.  The tear was identified and then four #2 Ethibond sutures in an inside-out fashion were used to close the rotator cuff tear and then a suture anchor was placed and the two sutures were then tied in a horizontal mattress configuration to repair the remaining portion of the opening.  The arm was taken through a range of motion.  There was no tension on the repair.  The wound was irrigated and closed with #2 Ethibond for the deltoid, 0 Monocryl for the deep subcu, 2-0 Monocryl for the superficial layer, and Steri-Strips were applied.  We did place a subcu pain pump catheter and we injected the balance of 30 mL of Marcaine with epinephrine.  We placed a cryo cuff on and activated it.  She was discharged only on Vicodin 1 q.4 p.r.n. for pain.  She did get a pain cocktail in the postop unit.  We will order the physical therapy when she comes to the office.  She has instructions to keep the cryo cuff on.     Vickki Hearing, M.D.     SEH/MEDQ  D:  08/09/2010  T:  08/09/2010  Job:  [id number]  Electronically Signed by Fuller Canada M.D. on 08/29/2010 12:44:31 PM

## 2010-09-01 LAB — CONVERTED CEMR LAB
ALT: 11 units/L (ref 0–35)
ALT: 16 units/L (ref 0–35)
AST: 20 units/L (ref 0–37)
AST: 21 units/L (ref 0–37)
Alkaline Phosphatase: 83 units/L (ref 39–117)
BUN: 5 mg/dL
Bilirubin, Direct: 0.1 mg/dL (ref 0.0–0.3)
CO2: 25 meq/L
CO2: 27 meq/L (ref 19–32)
Calcium: 9.5 mg/dL (ref 8.4–10.5)
Calcium: 9.6 mg/dL
Chloride: 103 meq/L (ref 96–112)
Chloride: 104 meq/L
Chloride: 104 meq/L (ref 96–112)
Cholesterol: 203 mg/dL — ABNORMAL HIGH (ref 0–200)
Creatinine, Ser: 0.71 mg/dL (ref 0.40–1.20)
Creatinine, Ser: 0.76 mg/dL
Creatinine, Ser: 0.86 mg/dL (ref 0.40–1.20)
Eosinophils Absolute: 0.1 10*3/uL (ref 0.0–0.7)
Eosinophils Relative: 1 % (ref 0–5)
GFR calc non Af Amer: >60 mL/min
Glomerular Filtration Rate, Af Am: >60 mL/min/{1.73_m2}
Glucose, Bld: 108 mg/dL
HCT: 35.2 %
HCT: 38.7 % (ref 36.0–46.0)
HDL: 42 mg/dL (ref >39)
Hemoglobin: 12 g/dL
Hemoglobin: 13.1 g/dL (ref 12.0–15.0)
INR: 0.9 (ref 0.0–1.5)
LDL Cholesterol: 94 mg/dL (ref 0–99)
Lymphocytes Relative: 36 % (ref 12–46)
Lymphs Abs: 2.2 10*3/uL (ref 0.7–4.0)
MCV: 89.6 fL (ref 78.0–100.0)
MCV: 90.8 fL
Monocytes Absolute: 0.5 10*3/uL (ref 0.1–1.0)
Platelets: 208 10*3/uL (ref 150–400)
Platelets: 239 10*3/uL
Potassium: 3.3 meq/L — ABNORMAL LOW (ref 3.5–5.3)
Potassium: 3.9 meq/L
Sodium: 139 meq/L (ref 135–145)
Sodium: 141 meq/L
Sodium: 143 meq/L (ref 135–145)
TSH: 0.964 microintl units/mL (ref 0.350–4.500)
Total Bilirubin: 0.2 mg/dL — ABNORMAL LOW (ref 0.3–1.2)
Total Bilirubin: 0.4 mg/dL (ref 0.3–1.2)
Total Protein: 7.3 g/dL (ref 6.0–8.3)
Total Protein: 7.3 g/dL (ref 6.0–8.3)
Triglycerides: 153 mg/dL — ABNORMAL HIGH (ref <150)
Triglycerides: 265 mg/dL — ABNORMAL HIGH (ref <150)
VLDL: 31 mg/dL (ref 0–40)
VLDL: 53 mg/dL — ABNORMAL HIGH (ref 0–40)
WBC: 5.9 10*3/uL (ref 4.0–10.5)
WBC: 6.6 10*3/uL
aPTT: 29 s (ref 24–37)

## 2010-09-02 ENCOUNTER — Ambulatory Visit: Admit: 2010-09-02 | Payer: Self-pay | Admitting: Internal Medicine

## 2010-09-03 NOTE — Miscellaneous (Signed)
Summary: arthrogram shoulder aph 07-15-10 at 9am coming in for results  Clinical Lists Changes  no precert needed, advised of appt, to bring disc, fu appt is for 07/23/10

## 2010-09-03 NOTE — Miscellaneous (Signed)
Summary: cmp,lipids per Dr. Margo Aye  Clinical Lists Changes  Observations: Added new observation of CALCIUM: 9.5 mg/dL (09/32/3557 32:20) Added new observation of ALBUMIN: 4.1 g/dL (25/42/7062 37:62) Added new observation of PROTEIN, TOT: 7.6 g/dL (83/15/1761 60:73) Added new observation of SGPT (ALT): 9 units/L (04/22/2010 16:28) Added new observation of SGOT (AST): 21 units/L (04/22/2010 16:28) Added new observation of ALK PHOS: 71 units/L (04/22/2010 16:28) Added new observation of BILI DIRECT: total bili   0.4 mg/dL (71/01/2693 85:46) Added new observation of GFR AA: >60 mL/min/1.3m2 (04/22/2010 16:28) Added new observation of GFR: >60 mL/min (04/22/2010 16:28) Added new observation of CREATININE: 0.76 mg/dL (27/10/5007 38:18) Added new observation of BUN: 16 mg/dL (29/93/7169 67:89) Added new observation of BG RANDOM: 73 mg/dL (38/05/1750 02:58) Added new observation of CO2 PLSM/SER: 28 meq/L (04/22/2010 16:28) Added new observation of CL SERUM: 103 meq/L (04/22/2010 16:28) Added new observation of K SERUM: 4.2 meq/L (04/22/2010 16:28) Added new observation of NA: 141 meq/L (04/22/2010 16:28) Added new observation of LDL: 179 mg/dL (52/77/8242 35:36) Added new observation of HDL: 50 mg/dL (14/43/1540 08:67) Added new observation of TRIGLYC TOT: 297 mg/dL (61/95/0932 67:12) Added new observation of CHOLESTEROL: 288 mg/dL (45/80/9983 38:25)  Appended Document: cmp,lipids per Dr. Margo Aye Reviewed. LDL is much worse. He is she taking Crestor still?  Appended Document: cmp,lipids per Dr. Margo Aye    Phone Note Outgoing Call   Call placed by: Larita Fife Via LPN,  April 25, 2010 8:43 AM Reason for Call: Discuss lab or test results Summary of Call: [facility]. Initial call taken by: Larita Fife Via LPN,  April 25, 2010 8:44 AM  Follow-up for Phone Call        Pt. states she does take Crestor 20mg  at bedtime and that Dr. Margo Aye recommended she take 1000mg  of Fish Oil which she is taking at this  time. Also, she states she is having Cataract surgery in near future and wants to know if she should hold Plavix before surgery. Please advise.  Follow-up by: Larita Fife Via LPN,  April 25, 2010 10:21 AM  Additional Follow-up for Phone Call Additional follow up Details #1::        Continue Crestor and omega-3 supplements for now. I am surprised that her LDL is so much less well-controlled if she has been taking her medicines as directed. As far as Plavix is concerned, would suggest that she discuss with her ophthalmologist whether or not they need the medicine held for the procedure. If they do, she will be one year out from her intervention with DES as of October 14. At that time she could temporarily hold Plavix, but would plan to resume after the procedure. Additional Follow-up by: Loreli Slot, MD, [facility],  April 25, 2010 12:12 PM    New/Updated Medications: CRESTOR 20 MG TABS (ROSUVASTATIN CALCIUM) Take one tablet by mouth daily. FISH OIL 1000 MG CAPS (OMEGA-3 FATTY ACIDS) take 1 tablet by mouth once daily Prescriptions: CRESTOR 20 MG TABS (ROSUVASTATIN CALCIUM) Take one tablet by mouth daily.  #30 x 6   Entered by:   Larita Fife Via LPN   Authorized by:   Loreli Slot, MD, [facility]   Signed by:   Larita Fife Via LPN on 05/39/7673   Method used:   Electronically to        [facility] (retail)       [address]       [place], [place]  [address]  Ph: [phone]       Fax: [phone]   RxID:   [id number]

## 2010-09-03 NOTE — Miscellaneous (Signed)
Summary: labs cmp,liver,lipid,tsh 08/07/2009  Clinical Lists Changes  Observations: Added new observation of CALCIUM: 9.5 mg/dL (57/84/6962 95:28) Added new observation of ALBUMIN: 3.8 g/dL (41/32/4401 02:72) Added new observation of PROTEIN, TOT: 7.3 g/dL (53/66/4403 47:42) Added new observation of SGPT (ALT): 11 units/L (08/07/2009 16:37) Added new observation of SGOT (AST): 20 units/L (08/07/2009 16:37) Added new observation of ALK PHOS: 73 units/L (08/07/2009 16:37) Added new observation of BILI DIRECT: 0.1 mg/dL (59/56/3875 64:33) Added new observation of CREATININE: 0.71 mg/dL (29/51/8841 66:06) Added new observation of BUN: 11 mg/dL (30/16/0109 32:35) Added new observation of BG RANDOM: 83 mg/dL (57/32/2025 42:70) Added new observation of CO2 PLSM/SER: 28 meq/L (08/07/2009 16:37) Added new observation of CL SERUM: 104 meq/L (08/07/2009 16:37) Added new observation of K SERUM: 4.1 meq/L (08/07/2009 16:37) Added new observation of NA: 143 meq/L (08/07/2009 16:37) Added new observation of LDL: 94 mg/dL (62/37/6283 15:17) Added new observation of HDL: 42 mg/dL (61/60/7371 06:26) Added new observation of TRIGLYC TOT: 153 mg/dL (94/85/4627 03:50) Added new observation of CHOLESTEROL: 167 mg/dL (09/38/1829 93:71) Added new observation of TSH: 0.964 microintl units/mL (08/07/2009 16:37)

## 2010-09-03 NOTE — Procedures (Signed)
Summary: Cardiology Device [facility]   Allergies: 1)  ! Sulfa 2)  ! Amitriptyline Hcl 3)  ! * Perphenazine 4)  ! Adhesive Tape  PPM Specifications Following MD:  Lewayne Bunting, MD     PPM Vendor:  Medtronic     PPM Model Number:  [id number]     PPM Serial Number:  [id number] [facility] PPM DOI:  12/27/2008     PPM Implanting MD:  Lewayne Bunting, MD  Lead 1    Location: RA     DOI: 12/27/2008     Model #: [id number]     Serial #: [id number]     Status: active Lead 2    Location: RV     DOI: 12/27/2008     Model #: [id number]     Serial #: [id number]     Status: active  Magnet Response Rate:  BOL 85 ERI 65  Indications:  A-fib   PPM Follow Up Remote Check?  No Battery Voltage:  2.79 V     Battery Est. Longevity:  13.5 years     Pacer Dependent:  No       PPM Device Measurements Atrium  Amplitude: 1.0 mV, Impedance: 456 ohms, Threshold: 0.625 V at 0.4 msec Right Ventricle  Amplitude: 16 mV, Impedance: 686 ohms, Threshold: 0.625 V at 0.4 msec  Episodes MS Episodes:  229     Percent Mode Switch:  1.0%     Coumadin:  No Ventricular High Rate:  1     Atrial Pacing:  79.7%     Ventricular Pacing:  0.5%  Parameters Mode:  DDDR     Lower Rate Limit:  60     Upper Rate Limit:  130 Paced AV Delay:  310     Sensed AV Delay:  310 Next Cardiology Appt Due:  06/04/2010 Tech Comments:  [name] was seen today during her routine visit with Dr. Diona Browner.  She has had 1 VHR 5 seconds and 229 mode switch epiodes the longest >13 hours back in October.  She is on plavix and ASA.  The total time of mode switch was 1% with ventricular rates controlled.  No parameter changes.  ROV 6 months with Dr. Ladona Ridgel in [place]. [name], LPN  Dec 19, 2009 1:19 PM  MD Comments:  Agree with above.

## 2010-09-03 NOTE — Assessment & Plan Note (Signed)
Summary: 2 MO RECK RT SHOULDER AFTER INJECDTION/SEC HOR/BSF   Visit Type:  Follow-up Referring [name]:  Dr. Catalina Pizza Primary [name]:  Dr. Catalina Pizza  CC:  right shoulder pain.  History of Present Illness: I saw [name] in the office today for a 2 month followup visit.  She is a 75 years old woman with the complaint of:  right shoulder pain  Medications: Amiodarone, Metroprolol, Citalopram, Aspirin, Potassium, Furosemide, Plavix, Nexium, Crestor.  Suspected impingement syndrome possible small tear rotator cuff  X-rays:   Impression type II acromion normal glenohumeral joint  Last visit, received subacromial injection the RIGHT shoulder, but still continues to have shoulder pain and neck pain   Current Medications (verified): 1)  Potassium Chloride Crys Cr 20 Meq Cr-Tabs (Potassium Chloride Crys Cr) .... Take One Tablet By Mouth Daily Prn 2)  Crestor 20 Mg Tabs (Rosuvastatin Calcium) .... Take One Tablet By Mouth Daily. 3)  Alprazolam 0.5 Mg Tabs (Alprazolam) .... As Needed 4)  Nexium 40 Mg Cpdr (Esomeprazole Magnesium) .... Take 1 Tablet By Mouth Two Times A Day 5)  Aspirin 325 Mg Tabs (Aspirin) .... Take 1 Tab Daily 6)  Abc Plus  Tabs (Multiple Vitamins-Minerals) .... Take 1 Tablet By Mouth Once A Day 7)  Furosemide 40 Mg Tabs (Furosemide) .... Take 1 Tab Am and 1/2 Pm 8)  B Complex-B12  Tabs (B Complex Vitamins) .... Take 1 Tablet By Mouth Once A Day 9)  Citalopram Hydrobromide 10 Mg Tabs (Citalopram Hydrobromide) .Marland Kitchen.. 1 Tab Once Daily 10)  Plavix 75 Mg Tabs (Clopidogrel Bisulfate) .... Take 1 Tab Daily 11)  Amiodarone Hcl 200 Mg Tabs (Amiodarone Hcl) .... Take 1 Tablet Once Daily 12)  Nitrostat 0.4 Mg Subl (Nitroglycerin) .... Take As Directed For Chest Pain 13)  Tramadol Hcl 50 Mg Tabs (Tramadol Hcl) .... Take As Needed For Pain 14)  Metoprolol Succinate 50 Mg Xr24h-Tab (Metoprolol Succinate) .... Take 1 Tab Daily 15)  Ondansetron 4 Mg Tbdp (Ondansetron) .... Take As  Needed For Nausea 16)  Dulcolax 10 Mg Supp (Bisacodyl) .... Take As Needed 17)  Vitamin E 400 Unit Caps (Vitamin E) .... Take 1 Tab Daily 18)  Vitamin D 1000 Unit Tabs (Cholecalciferol) .... Take 1 Tab Daily 19)  Fish Oil 1000 Mg Caps (Omega-3 Fatty Acids) .... Take 1 Tablet By Mouth Once Daily 20)  Diltiazem Hcl 30 Mg Tabs (Diltiazem Hcl) .... Take 1 Tab Three Times A Day 21)  Tylenol With Codeine #3 300-30 Mg Tabs (Acetaminophen-Codeine) .Marland Kitchen.. 1 Every 4 Hours As Needed  Allergies (verified): 1)  ! Sulfa 2)  ! Amitriptyline Hcl 3)  ! * Perphenazine 4)  ! 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  Social History: Reviewed history from 04/18/2009 and no changes required. She lives alone now and has an adopted daughter.  Tobacco Use - No.  Alcohol Use - no  Review of Systems Neurologic:  denies radicular symptoms. Musculoskeletal:  neck pain  no real stiffness.  Physical Exam  Additional Exam:  the patient has tenderness in the cervical spine in the midline at the base as well as in the trapezius muscle. This parascapular region. The posterior subacromial soft space and painful for elevation up to 120. She has a positive impingement sign. She cannot lift the arm on her own. Past 90 of forward  elevation.  She has weakness in external rotation.  Drop test maneuver was also suspect of cuff tear   Impression & Recommendations:  Problem # 1:  RUPTURE ROTATOR CUFF (ICD-727.61) Assessment Unchanged  MRI has been advised that she has a pacemaker so we will do an arthrogram.  Tylenol No. 3 for pain until we get her study.  Orders: Est. Patient Level III (87564)  Medications Added to Medication List This Visit: 1)  Tylenol With Codeine #3 300-30 Mg Tabs (Acetaminophen-codeine) .Marland Kitchen.. 1 every 4 hours as needed  Patient Instructions: 1)  ARTHROGRAM RIGHT SHOULDER  2)  TAKE PAIN MEDICATION FOR PAIN  Prescriptions: TYLENOL WITH CODEINE #3 300-30 MG TABS (ACETAMINOPHEN-CODEINE) 1 every 4 hours as needed  #42 x 1   Entered and Authorized by:   Fuller Canada MD   Signed by:   Fuller Canada MD on 07/10/2010   Method used:   Handwritten   RxID:   [id number]    Orders Added: 1)  Est. Patient Level III [16010]

## 2010-09-03 NOTE — Progress Notes (Signed)
  Phone Note Outgoing Call   Call placed by: Teressa Lower RN,  August 13, 2009 1:59 PM Call placed to: Patient Action Taken: Phone Call Completed, Information Sent Reason for Call: Discuss lab or test results Summary of Call: called results of echocardiogram to pt, no new orders given, confirmed chest pain and need to go to ED if chest pain or pressure continues Initial call taken by: Teressa Lower RN,  August 13, 2009 2:00 PM

## 2010-09-03 NOTE — Progress Notes (Signed)
Summary: chest pain and arm pain  Phone Note Call from Patient Call back at Home Phone [phone]   Caller: pt Reason for Call: Talk to Nurse Complaint: Abdominal Pain Summary of Call: pt calling with chest pain and left arm hurting she has took 3 nitro tabs 5 min apart and feels a little better but arm is still hurting. Spoke with [place] Schweppe about pt and was told that she need to go to ED. Pt said she would go if it got worse that she had some people coming over and didnt want to reschedule unless she had to. I told her again that she should go to ED. She said she would if it got worse. Initial call taken by: Faythe Ghee,  October 19, 2009 1:42 PM  Follow-up for Phone Call        encourage pt to go to ED if chest pain continues, stated after grand-daughter get in from school.  Will retake serial ntg sl then go to ED Follow-up by: Teressa Lower RN,  October 19, 2009 1:46 PM

## 2010-09-03 NOTE — Letter (Signed)
Summary: *Orthopedic Consult Note  [facility]  [address] [place] [address]  [place], [place] [address]   Phone: [phone]  Fax: [phone]    Re:    [name] DOB:    February 23, 1936   Dear: Dr Margo Aye:   Thank you for requesting that we see the above patient for consultation.  A copy of the detailed office note will be sent under separate cover, for your review.  Evaluation today is consistent with: Impingement syndrome possible cuff tear  Recommendations for subacromial injection, Tylenol #3 for pain and activity modification.  Followup 2 months.  Thank you for this opportunity to look after your patient.  Sincerely,   Terrance Mass. MD.

## 2010-09-03 NOTE — Assessment & Plan Note (Signed)
Summary: 1 mth f/u per checkout on 07/10/09/tg   Visit Type:  Follow-up Primary Provider:  Dr. Catalina Pizza   History of Present Illness: 75 year old woman seen last in the office back in December. Her complex medical illness as outlined below and in the prior note. She reports symptoms including recurrent nausea, NYHA class 2-3 dyspnea exertion and weakness, and also occasional lateral thoracic discomfort. She states that she is not "overly worried" about the symptoms and has "learned to live with them." She has had no syncope. No rapid palpitations.  She presented for a followup 2-D echocardiogram done earlier this morning, results pending.  Recent labs indicate sodium 143, potassium 4.1, BUN 11, creatinine 0.7, AST 20, ALT 11, albumin 3.8, cholesterol 167, triglyceride 153, HDL 42, LDL 94, TSH 0.96.  Current Medications (verified): 1)  Potassium Chloride Crys Cr 20 Meq Cr-Tabs (Potassium Chloride Crys Cr) .... Take One Tablet By Mouth Daily Prn 2)  Crestor 20 Mg Tabs (Rosuvastatin Calcium) .... Take One Tablet By Mouth Daily. 3)  Folic Acid   Powd (Folic Acid) .Marland Kitchen.. 1 By Mouth Once Daily 4)  Calcium Carbonate   Powd (Calcium Carbonate) .Marland Kitchen.. 1 By Mouth Once Daily 5)  Alprazolam 0.5 Mg Tabs (Alprazolam) .... As Needed 6)  Nexium 40 Mg Cpdr (Esomeprazole Magnesium) .... Take 1 Tablet By Mouth Two Times A Day 7)  Aspirin 325 Mg Tabs (Aspirin) .... Take 1 Tab Daily 8)  Alph-E-Mixed 1000 1000 Unit Caps (Vitamin E) .... Take 1 Tablet By Mouth Once A Day 9)  Abc Plus  Tabs (Multiple Vitamins-Minerals) .... Take 1 Tablet By Mouth Once A Day 10)  Furosemide 20 Mg Tabs (Furosemide) .... Take 2 Tabs in Am and 1 Tab in Pm 11)  B Complex-B12  Tabs (B Complex Vitamins) .... Take 1 Tablet By Mouth Once A Day 12)  Wellbutrin Sr 100 Mg Xr12h-Tab (Bupropion Hcl) .... Take 1 Tablet By Mouth Once A Day 13)  Citalopram Hydrobromide 10 Mg Tabs (Citalopram Hydrobromide) .Marland Kitchen.. 1 Tab Once Daily 14)  Plavix 75 Mg Tabs  (Clopidogrel Bisulfate) .... Take 1 Tab Daily 15)  Amiodarone Hcl 200 Mg Tabs (Amiodarone Hcl) .... Take 1 Tablet Once Daily 16)  Alprazolam 1 Mg Tabs (Alprazolam) .... Take As Needed 17)  Nitrostat 0.4 Mg Subl (Nitroglycerin) .... Take As Directed For Chest Pain 18)  Tramadol Hcl 50 Mg Tabs (Tramadol Hcl) .... Take As Needed For Pain 19)  Metoprolol Succinate 50 Mg Xr24h-Tab (Metoprolol Succinate) .... Take 1 Tab Daily 20)  Ondansetron 4 Mg Tbdp (Ondansetron) .... Take As Needed For Nausea 21)  Colcrys 0.6 Mg Tabs (Colchicine) .... Take 1 Tab Daily 22)  Crestor 20 Mg Tabs (Rosuvastatin Calcium) .... Take 1 Tab At Bedtime 23)  Dulcolax 10 Mg Supp (Bisacodyl) .... Take As Needed  Allergies (verified): 1)  ! Sulfa 2)  ! Amitriptyline Hcl 3)  ! * Perphenazine 4)  ! 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  Tobacco Use - No.  Alcohol Use - no  Review of Systems  The patient denies anorexia, fever, hoarseness, peripheral edema, prolonged  cough, melena, hematochezia, and severe indigestion/heartburn.         Otherwise reviewed and negative except as outlined above.  Vital Signs:  Patient profile:   75 year old female Weight:      143 pounds Pulse rate:   76 / minute BP sitting:   150 / 90  (right arm)  Vitals Entered By: Dreama Saa, CNA (August 09, 2009 9:23 AM)  Physical Exam  Additional Exam:  Pulses paradoxus found to be 8 mm of mercury. Patient comfortable in no acute distress, resting tremor noted. HEENT:  Conjunctiva and lids are normal, oropharynx clear. Neck: Supple no elevated jugular venous pressure or bruits. Lungs: Clear to auscultation with diminished breath sounds. Non-labored breathing at rest. Cardiac: Regular rate and rhythm without S3 gallop. No pericardial rub or knock. Thorax: Pacemaker pocket site on the left is stable and well healed. Subxiphoid incision well-healed. Extremities: Trace edema. Skin: Warm and dry. Musculoskeletal: Kyphosis noted. Neuropsychiatric: Alert and oriented x3. Affect appropriate.   PPM Specifications Following MD:  Lewayne Bunting, MD     PPM Vendor:  [employer]     PPM Model Number:  [id number]     PPM Serial Number:  [id number] PPM DOI:  12/27/2008     PPM Implanting MD:  Lewayne Bunting, MD  Lead 1    Location: [facility]     DOI: 12/27/2008     Model #: [id number]     Serial #: [id number]     Status: active Lead 2    Location: [facility]     DOI: 12/27/2008     Model #: 6213     Serial #: [id number]     Status: active  Magnet Response Rate:  BOL 85 ERI 65  Indications:  A-fib   PPM Follow Up Pacer Dependent:  No      Episodes Coumadin:  No  Parameters Mode:  DDDR     Lower Rate Limit:  60     Upper Rate Limit:  130 Paced AV Delay:  310     Sensed AV Delay:  310  Impression & Recommendations:  Problem # 1:  CORONARY ATHEROSCLEROSIS NATIVE CORONARY ARTERY (ICD-414.01)  No active anginal symptoms. Continue medical therapy.  Her updated medication list for this problem includes:    Aspirin 325 Mg Tabs (Aspirin) .Marland Kitchen... Take 1 tab daily    Plavix 75 Mg Tabs (Clopidogrel bisulfate) .Marland Kitchen... Take 1 tab daily    Nitrostat 0.4 Mg Subl (Nitroglycerin) .Marland Kitchen... Take as directed for chest pain    Metoprolol Succinate 50 Mg Xr24h-tab (Metoprolol succinate) .Marland Kitchen... Take 1 tab daily  Problem # 2:  ATRIAL FIBRILLATION (ICD-427.31)  Clinically in normal sinus rhythm on amiodarone. Recent followup surveillance labs show normal liver function tests and normal TSH. She is not on Coumadin  given history of hemorrhagic pericarditis.  Her updated medication list for this problem includes:    Aspirin 325 Mg Tabs (Aspirin) .Marland Kitchen... Take 1 tab daily    Plavix 75 Mg Tabs (Clopidogrel bisulfate) .Marland Kitchen... Take 1 tab daily    Amiodarone Hcl 200 Mg Tabs (Amiodarone hcl) .Marland Kitchen... Take 1 tablet once daily    Metoprolol Succinate 50 Mg Xr24h-tab (Metoprolol succinate) .Marland Kitchen... Take 1 tab daily  Problem # 3:  PERICARDIAL EFFUSION (ICD-423.9)  Status post subxiphoid pericardial window due to tamponade related to hemorrhagic pericardial effusion with fibrinous pericarditis. Followup 2-D echocardiogram was done earlier this morning, results pending. Plan is to reassess the pericardium, and also for potential constrictive physiology.  Clinical followup will be made in one month.  Her updated medication list for this problem includes:    Furosemide 20 Mg Tabs (Furosemide) .Marland Kitchen... Take 2 tabs in am and 1 tab in pm  Patient Instructions: 1)  Your physician recommends that you schedule a follow-up appointment in: 1 month 2)  Your physician recommends that you continue on your current medications as directed. Please refer to the Current Medication list given to you today.

## 2010-09-03 NOTE — Assessment & Plan Note (Signed)
Summary: eph/pacer fired/jml   Visit Type:  Follow-up Primary Provider:  Dr. Catalina Pizza   History of Present Illness: 75 year old woman presents for followup. I note that she was seen on August 8 in the emergency department with feelings that her "defibrillator" had fired. She was assessed by emergency department staff with a call placed to [employer]. In point of fact, she actually has a pacemaker, not a defibrillator. I discussed this with her today.  Laboratory data from 8 August showed a hemoglobin of 12.1, platelets 184, sodium 141, potassium 3.2, BUN 13, creatinine 0.8, troponin I less than 0.05. Chest x-ray report is reviewed below.  In reviewing her symptoms, Ms. Demps describes stable NYHA class II dyspnea on exertion. She has not had any reproducible exertional chest pain or palpitations. On the evening in question when she thought that she felt a "shock" she had actually just drifted off to sleep and suddenly woke up with a start.  She is due for formal device interrogation of the next few weeks in early September.  No followup lipid profile liver function tests as yet.  Current Medications (verified): 1)  Potassium Chloride Crys Cr 20 Meq Cr-Tabs (Potassium Chloride Crys Cr) .... Take One Tablet By Mouth Daily Prn 2)  Crestor 20 Mg Tabs (Rosuvastatin Calcium) .... Take One Tablet By Mouth Daily. 3)  Folic Acid   Powd (Folic Acid) .Marland Kitchen.. 1 By Mouth Once Daily 4)  Alprazolam 0.5 Mg Tabs (Alprazolam) .... As Needed 5)  Nexium 40 Mg Cpdr (Esomeprazole Magnesium) .... Take 1 Tablet By Mouth Two Times A Day 6)  Aspirin 325 Mg Tabs (Aspirin) .... Take 1 Tab Daily 7)  Abc Plus  Tabs (Multiple Vitamins-Minerals) .... Take 1 Tablet By Mouth Once A Day 8)  Furosemide 40 Mg Tabs (Furosemide) .... Take 1 Tab Am and 1/2 Pm 9)  B Complex-B12  Tabs (B Complex Vitamins) .... Take 1 Tablet By Mouth Once A Day 10)  Citalopram Hydrobromide 10 Mg Tabs (Citalopram Hydrobromide) .Marland Kitchen.. 1 Tab Once  Daily 11)  Plavix 75 Mg Tabs (Clopidogrel Bisulfate) .... Take 1 Tab Daily 12)  Amiodarone Hcl 200 Mg Tabs (Amiodarone Hcl) .... Take 1 Tablet Once Daily 13)  Nitrostat 0.4 Mg Subl (Nitroglycerin) .... Take As Directed For Chest Pain 14)  Tramadol Hcl 50 Mg Tabs (Tramadol Hcl) .... Take As Needed For Pain 15)  Metoprolol Succinate 50 Mg Xr24h-Tab (Metoprolol Succinate) .... Take 1 Tab Daily 16)  Ondansetron 4 Mg Tbdp (Ondansetron) .... Take As Needed For Nausea 17)  Colcrys 0.6 Mg Tabs (Colchicine) .... Take 1 Tab Daily 18)  Dulcolax 10 Mg Supp (Bisacodyl) .... Take As Needed 19)  Carisoprodol 350 Mg Tabs (Carisoprodol) .... Take 1/2 Tab Three Times A Day 20)  Vitamin E 400 Unit Caps (Vitamin E) .... Take 1 Tab Daily 21)  Vitamin D 1000 Unit Tabs (Cholecalciferol) .... Take 1 Tab Daily  Allergies (verified): 1)  ! Sulfa 2)  ! Amitriptyline Hcl 3)  ! * Perphenazine 4)  ! 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  Tobacco Use - No.  Alcohol Use - no  Clinical Review Panels:  Echocardiogram Echocardiogram  Study Conclusions    - Left ventricle: The cavity size was normal. There was mild     concentric hypertrophy. Systolic function was normal. The     estimated ejection fraction was in the range of 55% to 60%.   - Aortic valve: Mildly to moderately  calcified annulus. Trileaflet;     mildly thickened, mildly calcified leaflets. Mild regurgitation.   - Mitral valve: Mildly to moderately calcified annulus. Mild     regurgitation.   - Left atrium: The atrium was mildly dilated.   - Right atrium: The atrium was mildly dilated. (08/09/2009)    Review of Systems       The patient complains of dyspnea on exertion.  The patient denies anorexia, fever, syncope, peripheral edema, prolonged cough, melena, and hematochezia.         Otherwise negative except as outlined.  Vital Signs:  Patient profile:   75 year old female Weight:      135 pounds BMI:     21.87 Pulse rate:   72 / minute BP sitting:   139 / 84  (right arm)  Vitals Entered By: Dreama Saa, CNA (March 13, 2010 1:03 PM)  Physical Exam  Additional Exam:  Patient comfortable in no acute distress, resting tremor. HEENT: Conjunctiva and lids are normal, oropharynx clear. Neck: Supple no elevated jugular venous pressure or bruits. Lungs: Clear to auscultation with diminished breath sounds. Non-labored breathing at rest. Cardiac: Regular rate and rhythm without S3 gallop. No pericardial rub or knock. Thorax: Pacemaker pocket site on the left is stable and well healed. Subxiphoid incision well-healed. Extremities: Trace edema. Skin: Warm and dry. Musculoskeletal: Kyphosis noted. Neuropsychiatric: Alert and oriented x3. Affect appropriate.   CXR  Procedure date:  03/11/2010  Findings:       Comparison: 12/07/2009    Findings: AP upright exam shows normal heart size without   congestive heart failure.  Lungs clear.  No pleural fluid.  Dual   lead permanent cardiac pacemaker unchanged.    IMPRESSION:   Permanent cardiac pacemaker - no active disease in one-view.  PPM Specifications Following MD:  Lewayne Bunting, MD     PPM Vendor:  [employer]     PPM Model Number:  ADDRL1     PPM Serial Number:  [id number] PPM DOI:  12/27/2008     PPM Implanting MD:  Lewayne Bunting,  MD  Lead 1    Location: [facility]     DOI: 12/27/2008     Model #: 4098     Serial #: [id number]     Status: active Lead 2    Location: [facility]     DOI: 12/27/2008     Model #: 6213     Serial #: [id number]     Status: active  Magnet Response Rate:  BOL 85 ERI 65  Indications:  A-fib   PPM Follow Up Pacer Dependent:  No      Episodes Coumadin:  No  Parameters Mode:  DDDR     Lower Rate Limit:  60     Upper Rate Limit:  130 Paced AV Delay:  310     Sensed AV Delay:  310  Impression & Recommendations:  Problem # 1:  CORONARY ATHEROSCLEROSIS NATIVE CORONARY ARTERY (ICD-414.01)  No active anginal symptoms and recent troponin I negative.  Plan to continue clinical observation with followup over the next 3 months.  Her updated medication list for this problem includes:    Aspirin 325 Mg Tabs (Aspirin) .Marland Kitchen... Take 1 tab daily    Plavix 75 Mg Tabs (Clopidogrel bisulfate) .Marland Kitchen... Take 1 tab daily    Nitrostat 0.4 Mg Subl (Nitroglycerin) .Marland Kitchen... Take as directed for chest pain    Metoprolol Succinate 50 Mg Xr24h-tab (Metoprolol succinate) .Marland Kitchen... Take 1 tab daily  Problem # 2:  ESSENTIAL HYPERTENSION, BENIGN (ICD-401.1)  Blood pressure control trend somewhat better. Discussed sodium restriction. She appears to be compliant with her medications.  Her updated medication list for this problem includes:    Aspirin 325 Mg Tabs (Aspirin) .Marland Kitchen... Take 1 tab daily    Furosemide 40 Mg Tabs (Furosemide) .Marland Kitchen... Take 1 tab am and 1/2 pm    Metoprolol Succinate 50 Mg Xr24h-tab (Metoprolol succinate) .Marland Kitchen... Take 1 tab daily  Problem # 3:  BRADYCARDIA-TACHYCARDIA SYNDROME (ICD-427.81)  Patient due for device interrogation in early September.  Her updated medication list for this problem includes:    Aspirin 325 Mg Tabs (Aspirin) .Marland Kitchen... Take 1 tab daily    Plavix 75 Mg Tabs (Clopidogrel bisulfate) .Marland Kitchen... Take 1 tab daily    Amiodarone Hcl 200 Mg Tabs (Amiodarone hcl) .Marland Kitchen... Take 1 tablet once daily    Nitrostat 0.4 Mg  Subl (Nitroglycerin) .Marland Kitchen... Take as directed for chest pain    Metoprolol Succinate 50 Mg Xr24h-tab (Metoprolol succinate) .Marland Kitchen... Take 1 tab daily  Problem # 4:  ATRIAL FIBRILLATION (ICD-427.31)  No reported palpitations. He is not on Coumadin with history of hemorrhagic pericardial effusion requiring window. She otherwise has been able to tolerate dual antiplatelet therapy.  Her updated medication list for this problem includes:    Aspirin 325 Mg Tabs (Aspirin) .Marland Kitchen... Take 1 tab daily    Plavix 75 Mg Tabs (Clopidogrel bisulfate) .Marland Kitchen... Take 1 tab daily    Amiodarone Hcl 200 Mg Tabs (Amiodarone hcl) .Marland Kitchen... Take 1 tablet once daily    Metoprolol Succinate 50 Mg Xr24h-tab (Metoprolol succinate) .Marland Kitchen... Take 1 tab daily  Problem # 5:  HYPERLIPIDEMIA (ICD-272.4)  Plan followup lipid profile and liver function tests prior to next visit.  Her updated medication list for this problem includes:    Crestor 20 Mg Tabs (Rosuvastatin calcium) .Marland Kitchen... Take one tablet by mouth daily.  Future Orders: T-Lipid Profile [id number] ... 06/04/2010 T-Hepatic Function [id number]) ... 06/04/2010  Patient Instructions: 1)  Your physician recommends that you schedule a follow-up appointment in: 3 months 2)  Your physician recommends that you return for lab work in: 3 months, just before next office visit 3)  Your physician recommends that you continue on your current medications as directed. Please refer to the Current Medication list given to you today.

## 2010-09-03 NOTE — Assessment & Plan Note (Signed)
Summary: F3M   Visit Type:  Follow-up Primary Provider:  Dr. Catalina Pizza   History of Present Illness: 75 year old presents for follow-up. She reports fairly chronic problems with shortness of breath, tremors, and a progressive problem with dysphagia as well as having to clear her throat. She has had intermittent, atypical chest discomfort, that can last for days at a time, is not specifically exertional. He is not reporting any clear anginal symptoms on medical therapy.  She was seen in the emergency department earlier in May with weakness and shortness of breath. Labs from 6 May revealed hemoglobin 12.4, platelets 233, d-dimer 0.55, potassium 3.7, BUN 16, creatinine 0.8, BNP 127. She underwent a CT scan of the chest, described below. She was not admitted.  She reports a pending evaluation at [facility] for further evaluation of dysphagia. She is followed by [name] for local gastroenterology care.  Followup labs per Dr. Margo Aye back in late February revealed potassium 4.3, BUN 18, creatinine 0.8, AST 22, ALT 15, hemoglobin 11.9, platelets 218, ESR 20, BNP 65.   She describes recent episode of a feeling of "heart pause," although has had no frank dizziness or syncope, and no sense of rapid palpitations.  Current Medications (verified): 1)  Potassium Chloride Crys Cr 20 Meq Cr-Tabs (Potassium Chloride Crys Cr) .... Take One Tablet By Mouth Daily Prn 2)  Crestor 20 Mg Tabs (Rosuvastatin Calcium) .... Take One Tablet By Mouth Daily. 3)  Folic Acid   Powd (Folic Acid) .Marland Kitchen.. 1 By Mouth Once Daily 4)  Calcium Carbonate   Powd (Calcium Carbonate) .Marland Kitchen.. 1 By Mouth Once Daily 5)  Alprazolam 0.5 Mg Tabs (Alprazolam) .... As Needed 6)  Nexium 40 Mg Cpdr (Esomeprazole Magnesium) .... Take 1 Tablet By Mouth Two Times A Day 7)  Aspirin 325 Mg Tabs (Aspirin) .... Take 1 Tab Daily 8)  Alph-E-Mixed 1000 1000 Unit Caps (Vitamin E) .... Take 1 Tablet By Mouth Once A Day 9)  Abc Plus  Tabs (Multiple Vitamins-Minerals)  .... Take 1 Tablet By Mouth Once A Day 10)  Furosemide 20 Mg Tabs (Furosemide) .... Take 1 Tab Am 1/2 Pm 11)  B Complex-B12  Tabs (B Complex Vitamins) .... Take 1 Tablet By Mouth Once A Day 12)  Citalopram Hydrobromide 10 Mg Tabs (Citalopram Hydrobromide) .Marland Kitchen.. 1 Tab Once Daily 13)  Plavix 75 Mg Tabs (Clopidogrel Bisulfate) .... Take 1 Tab Daily 14)  Amiodarone Hcl 200 Mg Tabs (Amiodarone Hcl) .... Take 1 Tablet Once Daily 15)  Nitrostat 0.4 Mg Subl (Nitroglycerin) .... Take As Directed For Chest Pain 16)  Tramadol Hcl 50 Mg Tabs (Tramadol Hcl) .... Take As Needed For Pain 17)  Metoprolol Succinate 50 Mg Xr24h-Tab (Metoprolol Succinate) .... Take 1 Tab Daily 18)  Ondansetron 4 Mg Tbdp (Ondansetron) .... Take As Needed For Nausea 19)  Colcrys 0.6 Mg Tabs (Colchicine) .... Take 1 Tab Daily 20)  Dulcolax 10 Mg Supp (Bisacodyl) .... Take As Needed 21)  Carisoprodol 350 Mg Tabs (Carisoprodol) .... Take 1/2 Tab Three Times A Day  Allergies (verified): 1)  ! Sulfa 2)  ! Amitriptyline Hcl 3)  ! * Perphenazine 4)  ! 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  Tobacco Use - No.  Alcohol Use - no  Clinical Review Panels:  Echocardiogram Echocardiogram  Study Conclusions    - Left ventricle: The cavity size was normal. There was mild     concentric hypertrophy.  Systolic function was normal. The     estimated ejection fraction was in the range of 55% to 60%.   - Aortic valve: Mildly to moderately calcified annulus. Trileaflet;     mildly thickened, mildly calcified leaflets. Mild regurgitation.   - Mitral valve: Mildly to moderately calcified annulus. Mild     regurgitation.   - Left atrium: The atrium was mildly dilated.   - Right atrium: The atrium was mildly dilated. (08/09/2009)    Review of Systems  The patient denies anorexia, fever, weight gain, syncope, peripheral edema, prolonged cough, headaches, hemoptysis, melena, and hematochezia.         Resting tremor, somewhat worse. No seizure activity. Stable appetite, problems with dysphasia however. Also reports problems with shoulder and arm weakness when trying to extend her arms over her head. This is being followed by Dr. Margo Aye. Otherwise reviewed and negative except as outlined.  Vital Signs:  Patient profile:   75 year old female Weight:      137 pounds BMI:     22.19 Pulse rate:   70 / minute BP sitting:   139 / 86  (right arm)  Vitals Entered By: Dreama Saa, CNA (Dec 19, 2009 12:36 PM)  Physical Exam  Additional Exam:  Patient comfortable in no acute distress, resting tremor. HEENT: Conjunctiva and lids are normal, oropharynx clear. Neck: Supple no elevated jugular venous pressure or bruits. Lungs: Clear to auscultation with diminished breath sounds. Non-labored breathing at rest. Cardiac: Regular rate and rhythm without S3 gallop. No pericardial rub or knock. Thorax: Pacemaker pocket site on the left is stable and well healed. Subxiphoid incision well-healed. Extremities: Trace edema. Skin: Warm and dry. Musculoskeletal: Kyphosis noted. Neuropsychiatric: Alert and oriented x3. Affect appropriate.   CT Scan  Procedure date:  12/07/2009  Findings:       Contrast:  80 ml Omniscan 300 IV contrast    Comparison:  Chest radiograph same date, chest CT 06/30/2009     Findings:  Streak artifact from left-sided pacemaker again noted.   The thyroid remains inhomogeneous.  Atherosclerotic vascular   calcification noted without aneurysm.  Heart size is normal.  Trace   pericardial fluid is decreased since the prior study.  No pleural   effusion.    The study is of adequate technical quality for evaluation for   pulmonary embolism up to and including the 3rd order pulmonary   arteries. No focal filling defect is seen to suggest acute   pulmonary embolism.    No lymphadenopathy.  Biapical bullae and scarring and diffuse   emphysematous changes are noted.  Lungs otherwise clear.    Incomplete imaging of the upper abdomen demonstrates splenic and   hepatic calcifications, possibly related to granulomatous disease.    Review of the MIP images confirms the above findings.    IMPRESSION:   No acute cardiopulmonary process.  Specifically, no pulmonary   embolism is identified.  PPM Specifications Following MD:  Lewayne Bunting, MD     PPM Vendor:  [employer]  PPM Model Number:  [id number]     PPM Serial Number:  [id number] PPM DOI:  12/27/2008     PPM Implanting MD:  Lewayne Bunting, MD  Lead 1    Location: [facility]     DOI: 12/27/2008     Model #: [id number]     Serial #: [id number]     Status: active Lead 2    Location: [facility]     DOI: 12/27/2008     Model #: [id number]     Serial #: [id number]     Status: active  Magnet Response Rate:  BOL 85 ERI 65  Indications:  A-fib   PPM Follow Up Pacer Dependent:  No      Episodes Coumadin:  No  Parameters Mode:  DDDR     Lower Rate Limit:  60     Upper Rate Limit:  130 Paced AV Delay:  310     Sensed AV Delay:  310  Impression & Recommendations:  Problem # 1:  CORONARY ATHEROSCLEROSIS NATIVE CORONARY ARTERY (ICD-414.01)  No clear anginal symptoms. Chest pain that is described as very atypical. She remains on dual antiplatelet therapy following intervention back in October 2010. Echocardiogram from January was reviewed. Patient's  chronic shortness of breath is likely multifactorial. Recent CT scan of the chest did not demonstrate pulmonary embolus or other major pulmonary pathology. I suppose it is possible she is having problems with chronic aspiration with the possibility of esophageal disease being evaluated under the direction of [name]. Device interrogation today does not suggest any increasing frequency or burden of atrial fibrillation as a potential contributor and factor. She has no evidence of recurrent pericardial effusion either. Plan six-month followup, sooner if needed.  Her updated medication list for this problem includes:    Aspirin 325 Mg Tabs (Aspirin) .Marland Kitchen... Take 1 tab daily    Plavix 75 Mg Tabs (Clopidogrel bisulfate) .Marland Kitchen... Take 1 tab daily    Nitrostat 0.4 Mg Subl (Nitroglycerin) .Marland Kitchen... Take as directed for chest pain    Metoprolol Succinate 50 Mg Xr24h-tab (Metoprolol succinate) .Marland Kitchen... Take 1 tab daily  Problem # 2:  PERICARDIAL EFFUSION (ICD-423.9)  No evidence of recurrence based on recent CT scan of the chest, and echocardiogram from January. She is status post pericardial window.  Her updated medication list for this problem includes:    Furosemide 20 Mg Tabs (Furosemide) .Marland Kitchen... Take 1 tab am 1/2 pm  Problem # 3:  ATRIAL FIBRILLATION (ICD-427.31)  Fairly well controlled on present regimen including amiodarone and metoprolol. Patient is not on Coumadin with prior  history of hemorrhagic pericardial effusion. Followup liver function tests and TSH to be obtained.  Her updated medication list for this problem includes:    Aspirin 325 Mg Tabs (Aspirin) .Marland Kitchen... Take 1 tab daily    Plavix 75 Mg Tabs (Clopidogrel bisulfate) .Marland Kitchen... Take 1 tab daily    Amiodarone Hcl 200 Mg Tabs (Amiodarone hcl) .Marland Kitchen... Take 1 tablet once daily    Metoprolol Succinate 50 Mg Xr24h-tab (Metoprolol succinate) .Marland Kitchen... Take 1 tab daily  Problem # 4:  BRADYCARDIA-TACHYCARDIA SYNDROME (ICD-427.81)  Pacemaker interrogated today  with appropriate function.  Her updated medication list for this problem includes:    Aspirin 325 Mg Tabs (Aspirin) .Marland Kitchen... Take 1 tab daily    Plavix 75 Mg Tabs (Clopidogrel bisulfate) .Marland Kitchen... Take 1 tab daily    Amiodarone Hcl 200 Mg Tabs (Amiodarone hcl) .Marland Kitchen... Take 1 tablet once daily    Nitrostat 0.4 Mg Subl (Nitroglycerin) .Marland KitchenMarland KitchenMarland KitchenMarland Kitchen  Take as directed for chest pain    Metoprolol Succinate 50 Mg Xr24h-tab (Metoprolol succinate) .Marland Kitchen... Take 1 tab daily  Problem # 5:  HYPERLIPIDEMIA (ICD-272.4)  Patient due for followup lipid profile and liver function tests.  Her updated medication list for this problem includes:    Crestor 20 Mg Tabs (Rosuvastatin calcium) .Marland Kitchen... Take one tablet by mouth daily.  Future Orders: T-Lipid Profile [phone]) ... 12/24/2009 T-Hepatic Function [id number]) ... 12/24/2009  Problem # 6:  RESTING TREMOR (ICD-781.0)  Patient reports that this seems to be getting worse. She indicates being seen by [name] in the past. It may be worth considering referral back to neurology. Will defer to Dr. Margo Aye.  Other Orders: Future Orders: T-TSH ([id number]) ... 12/24/2009  Patient Instructions: 1)  Your physician recommends that you schedule a follow-up appointment in: 6 months 2)  Your physician recommends that you return for lab work in: next week 3)  Your physician recommends that you continue on your current medications as directed. Please refer to the Current Medication list given to you today.

## 2010-09-03 NOTE — Progress Notes (Signed)
Summary: SOB  Phone Note Call from Patient Call back at Home Phone [phone]   Caller: PT Reason for Call: Talk to Nurse Summary of Call: spoke with pt and discussed need to take furosemide as prescribed and also to go to ed if cp or sob worsen over the weekendT. Allyne Gee, RN 08/10/2009 1710 Initial call taken by: Faythe Ghee,  August 10, 2009 2:42 PM  Follow-up for Phone Call        spoke with pt at length about  sob and cp, told pt to go to ED if cp continue or worsens this weekend     Appended Document: SOB PATIENT CALLING FOR SAME SYMPTOMS WANT TO TALK WITH [name] HAS QUESTIONS ON WHAT TO DO AGAIN

## 2010-09-03 NOTE — Assessment & Plan Note (Signed)
Summary: RT SHOULDER PAIN NEEDS XR/SEC HOR/Z HALL/BSF   Vital Signs:  Patient profile:   75 year old female Height:      65 inches Weight:      140 pounds Temp:     98.7 degrees F  Visit Type:  Initial Consult Referring Provider:  Dr. Catalina Pizza Primary Provider:  Dr. Catalina Pizza  CC:  right shoulder pain.  History of Present Illness: I saw [name] in the office today for an initial visit.  She is a 75 years old woman with the complaint of:  right shoulder pain  Xrays today.  Medications: Amiodarone, Metroprolol, Citalopram, Aspirin, Potassium, Furosemide, Plavix, Nexium, Crestor.  The patient complains of sharp RIGHT shoulder pain that is rated a 10 out of 10.  It is intermittent it came on gradually.  It is better when she raises her arm up on a pillow its worse with movement.  She says she gets some numbness and tingling.  She tried some muscle relaxers it didn't help.  The pain does not go below the elbow.  It hurts to raise her arm up above her head.  Allergies: 1)  ! Sulfa 2)  ! Amitriptyline Hcl 3)  ! * Perphenazine 4)  ! 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 Cardiovascular:  Complains of chest pain; denies palpitations, fainting, and murmurs. Respiratory:  Complains of short of breath and couch; denies wheezing,  tightness, pain on inspiration, and snoring . Gastrointestinal:  Complains of heartburn, vomiting, and constipation; denies nausea, diarrhea, and blood in your stools. Genitourinary:  Complains of urgency; denies frequency, difficulty urinating, painful urination, flank pain, and bleeding in urine. Neurologic:  Complains of numbness, tingling, unsteady gait, and tremors; denies dizziness and seizure. Musculoskeletal:  Complains of joint pain, stiffness, and muscle pain; denies swelling, instability, redness, and heat. Endocrine:  Complains of excessive thirst; denies exessive urination and heat or cold intolerance. Psychiatric:  Complains of nervousness, depression, and anxiety; denies hallucinations. Skin:  Denies changes in the skin, poor healing, rash, itching, and redness. HEENT:  Complains of blurred or double vision and eye pain; denies redness and watering. Immunology:  Denies seasonal allergies, sinus problems, and allergic to bee stings. Hemoatologic:  Complains of easy bleeding and brusing.  Physical Exam  Additional Exam:  Constitutional: vital signs see recorded values. General: normal development, nutrition, and grooming. No deformity. Body Habitus is medium. CDV: Observation and palpation was normal  Lymph: palpation of the lymph nodes were normal Skin: inspection and palpation of the skin revealed no abnormalities  Neuro: coordination: normal              DTR's normal              Sensation was normal  Psyche: Alert and oriented x 3. Mood was normal.  Affect: normal  MSK: Gait: normal   RIGHT shoulder inspection tenderness anterolateral deltoid.  A.c. joint nontender.  No swelling.  No warmth.  Range of motion  passive forward elevation 130.  External rotation with the arm at the side to 50.  Abduction 90.  Strength assessment grade 4/5 supraspinatus grade 5 out of 5 infraspinatus grade 5 out of 5 subscapularis  Stable in abduction external rotation, no sulcus  sign  LEFT shoulder inspection normal.  Range of motion 130 for elevation.  Strength normal.  Stability     Impression & Recommendations:  Problem # 1:  IMPINGEMENT SYNDROME (ICD-726.2) Assessment New  suspected impingement syndrome possible small tear rotator cuff  X-rays AP lateral shoulder she'll no significant changes consistent with any glenohumeral arthritis.  She has a type II acromion.  Impression type II acromion normal glenohumeral joint  Subacromial injection RIGHT shoulder Verbal consent obtained/The shoulder was injected with depomedrol 40mg /cc and sensorcaine .25% . There were no complications  Orders: Consultation Level III (21308) Shoulder x-ray,  minimum 2 views (65784) Joint Aspirate / Injection, Large (20610) Depo- Medrol 40mg  (J1030)  Problem # 2:  RUPTURE ROTATOR CUFF (ICD-727.61) Assessment: New  Orders: Consultation Level III (69629) Shoulder x-ray,  minimum 2 views (52841) Joint Aspirate / Injection, Large (20610) Depo- Medrol 40mg  (J1030)  Patient Instructions: 1)  You have received an injection of cortisone today. You may experience increased pain at the injection site. Apply ice pack to the area for 20 minutes every 2 hours and take 2 xtra strength tylenol every 8 hours. This increased pain will usually resolve in 24 hours. The injection will take effect in 3-10 days.  2)  Please schedule a follow-up appointment in 2 months.

## 2010-09-03 NOTE — Letter (Signed)
Summary: History form  History form   Imported By: Jacklynn Ganong 05/10/2010 08:13:13  _____________________________________________________________________  External Attachment:    Type:   Image     Comment:   External Document

## 2010-09-03 NOTE — Miscellaneous (Signed)
Summary: LABS CMP,LIVER,LIPIDS,TSH 08/07/2009  Clinical Lists Changes  Observations: Added new observation of CALCIUM: 9.5 mg/dL (62/13/0865 78:46) Added new observation of ALBUMIN: 3.8 g/dL (96/29/5284 13:24) Added new observation of PROTEIN, TOT: 7.3 g/dL (40/05/2724 36:64) Added new observation of SGPT (ALT): 11 units/L (08/07/2009 14:54) Added new observation of SGOT (AST): 20 units/L (08/07/2009 14:54) Added new observation of ALK PHOS: 73 units/L (08/07/2009 14:54) Added new observation of BILI DIRECT: 0.1 mg/dL (40/34/7425 95:63) Added new observation of CREATININE: 0.71 mg/dL (87/56/4332 95:18) Added new observation of BUN: 11 mg/dL (84/16/6063 01:60) Added new observation of BG RANDOM: 83 mg/dL (10/93/2355 73:22) Added new observation of CO2 PLSM/SER: 28 meq/L (08/07/2009 14:54) Added new observation of CL SERUM: 104 meq/L (08/07/2009 14:54) Added new observation of K SERUM: 4.1 meq/L (08/07/2009 14:54) Added new observation of NA: 143 meq/L (08/07/2009 14:54) Added new observation of LDL: 94 mg/dL (02/54/2706 23:76) Added new observation of HDL: 42 mg/dL (28/31/5176 16:07) Added new observation of TRIGLYC TOT: 153 mg/dL (37/05/6268 48:54) Added new observation of CHOLESTEROL: 167 mg/dL (62/70/3500 93:81) Added new observation of TSH: 0.964 microintl units/mL (08/07/2009 14:54)

## 2010-09-03 NOTE — Letter (Signed)
Summary: Letter/ [facility]  Letter/ [facility]   Imported By: Dorise Hiss 05/22/2010 14:57:10  _____________________________________________________________________  External Attachment:    Type:   Image     Comment:   External Document

## 2010-09-03 NOTE — Miscellaneous (Signed)
Summary: chest xray 03/11/2010  Clinical Lists Changes  Observations: Added new observation of CXR RESULTS:  Clinical Data: Short of breath/chest pain/defibrillator fired    PORTABLE CHEST - 1 VIEW    Comparison: 12/07/2009    Findings: AP upright exam shows normal heart size without   congestive heart failure.  Lungs clear.  No pleural fluid.  Dual   lead permanent cardiac pacemaker unchanged.    IMPRESSION:   Permanent cardiac pacemaker - no active disease in one-view.    Read By:  Bernerd Limbo,  M.D.   Released By:  Bernerd Limbo,  M.D.  (03/11/2010 8:06)      CXR  Procedure date:  03/11/2010  Findings:       Clinical Data: Short of breath/chest pain/defibrillator fired    PORTABLE CHEST - 1 VIEW    Comparison: 12/07/2009    Findings: AP upright exam shows normal heart size without   congestive heart failure.  Lungs clear.  No pleural fluid.  Dual   lead permanent cardiac pacemaker unchanged.    IMPRESSION:   Permanent cardiac pacemaker - no active disease in one-view.    Read By:  Bernerd Limbo,  M.D.   Released By:  Bernerd Limbo,  M.D.

## 2010-09-03 NOTE — Letter (Signed)
Summary: DRUG INTERACTION ALERT  DRUG INTERACTION ALERT   Imported By: Faythe Ghee 03/15/2010 15:13:33  _____________________________________________________________________  External Attachment:    Type:   Image     Comment:   External Document

## 2010-09-03 NOTE — Procedures (Signed)
Summary: 25yr/rm   Visit Type:  Follow-up Primary Teran Knittle:  Dr. Catalina Pizza  CC:  some chest soreness.  History of Present Illness: Ms. Nonaka returns today for followup.  She is a pleasant 75 yo woman with a h/o bradycardia, s/p PPM.  She notes the sensation of an electrical discharge which woke her from sleep.  She continues to have non-exertional chest pressure.  She admits to being under increased stress as she has multiple younger family members who have been ill.  No other complaints today.  Current Medications (verified): 1)  Potassium Chloride Crys Cr 20 Meq Cr-Tabs (Potassium Chloride Crys Cr) .... Take One Tablet By Mouth Daily Prn 2)  Crestor 20 Mg Tabs (Rosuvastatin Calcium) .... Take One Tablet By Mouth Daily. 3)  Folic Acid   Powd (Folic Acid) .Marland Kitchen.. 1 By Mouth Once Daily 4)  Alprazolam 0.5 Mg Tabs (Alprazolam) .... As Needed 5)  Nexium 40 Mg Cpdr (Esomeprazole Magnesium) .... Take 1 Tablet By Mouth Two Times A Day 6)  Aspirin 325 Mg Tabs (Aspirin) .... Take 1 Tab Daily 7)  Abc Plus  Tabs (Multiple Vitamins-Minerals) .... Take 1 Tablet By Mouth Once A Day 8)  Furosemide 40 Mg Tabs (Furosemide) .... Take 1 Tab Am and 1/2 Pm 9)  B Complex-B12  Tabs (B Complex Vitamins) .... Take 1 Tablet By Mouth Once A Day 10)  Citalopram Hydrobromide 10 Mg Tabs (Citalopram Hydrobromide) .Marland Kitchen.. 1 Tab Once Daily 11)  Plavix 75 Mg Tabs (Clopidogrel Bisulfate) .... Take 1 Tab Daily 12)  Amiodarone Hcl 200 Mg Tabs (Amiodarone Hcl) .... Take 1 Tablet Once Daily 13)  Nitrostat 0.4 Mg Subl (Nitroglycerin) .... Take As Directed For Chest Pain 14)  Tramadol Hcl 50 Mg Tabs (Tramadol Hcl) .... Take As Needed For Pain 15)  Metoprolol Succinate 50 Mg Xr24h-Tab (Metoprolol Succinate) .... Take 1 Tab Daily 16)  Ondansetron 4 Mg Tbdp (Ondansetron) .... Take As Needed For Nausea 17)  Colcrys 0.6 Mg Tabs (Colchicine) .... Take 1 Tab Daily 18)  Dulcolax 10 Mg Supp (Bisacodyl) .... Take As Needed 19)  Carisoprodol  350 Mg Tabs (Carisoprodol) .... Take 1/2 Tab Three Times A Day 20)  Vitamin E 400 Unit Caps (Vitamin E) .... Take 1 Tab Daily 21)  Vitamin D 1000 Unit Tabs (Cholecalciferol) .... Take 1 Tab Daily  Allergies (verified): 1)  ! Sulfa 2)  ! Amitriptyline Hcl 3)  ! * Perphenazine 4)  ! 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    Vital Signs:  Patient profile:   75 year old female Weight:      136 pounds Pulse rate:   73 / minute BP sitting:   138 / 76  (right arm)  Vitals Entered By: Dreama Saa, CNA (March 14, 2010 8:29 AM)  Physical Exam  General:  Well developed, well nourished, in no acute distress.healthy appearing.   Head:  normocephalic and atraumatic Eyes:  PERRLA/EOM intact; conjunctiva and lids normal. Mouth:  Teeth, gums and palate normal. Oral mucosa normal. Neck:  Neck supple, no JVD. No masses, thyromegaly or abnormal cervical nodes. Chest Wall:  well healed PM incision. Lungs:  Clear bilaterally to auscultation with no wheezes, rales, or rhonchi. Heart:  RRR with normal S1 and S2.  PMI is not enlarged or laterally displaced. Abdomen:  Bowel sounds positive; abdomen soft and non-tender without masses, organomegaly, or hernias noted. No hepatosplenomegaly. Msk:  Back normal, normal gait. Muscle strength and tone normal.  There are healed lesions on her back that appear to be shingles with some dried areas noted. Pulses:  pulses normal in all 4 extremities Extremities:  No clubbing or cyanosis. Scratches on legs bilaterally Neurologic:  Continued essential tremor noted.   PPM Specifications Following MD:  Lewayne Bunting, MD     PPM Vendor:  Medtronic     PPM Model Number:  ADDRL1     PPM Serial Number:  [id number] [facility] PPM DOI:  12/27/2008     PPM Implanting MD:  Lewayne Bunting, MD  Lead 1    Location: RA     DOI: 12/27/2008     Model #: [id number]     Serial #: [id number]     Status: active Lead 2    Location: RV     DOI: 12/27/2008     Model #: [id number]     Serial #: [id number]     Status: active  Magnet Response Rate:  BOL 85 ERI 65  Indications:  A-fib   PPM Follow Up Remote Check?  No Battery Voltage:  2.79 V     Battery Est. Longevity:  13.5 years     Pacer Dependent:  No       PPM Device Measurements Atrium  Impedance: 454 ohms, Threshold: 0.625 V at 0.4 msec Right Ventricle  Amplitude: 11.2 mV, Impedance: 686 ohms, Threshold: 0.5 V at 0.4 msec  Episodes MS Episodes:  0     Percent Mode Switch:  0     Coumadin:  No Ventricular High Rate:  0     Atrial Pacing:  96.6%     Ventricular Pacing:  2.1%  Parameters Mode:  DDDR     Lower Rate Limit:  60     Upper Rate Limit:  130 Paced AV Delay:  310     Sensed AV Delay:  310 Next Cardiology Appt Due:  09/04/2010 Tech Comments:  No parameter changes.  Device function normal.  No Carelink @ this time.  ROV 6 months RDS clinic. [name], LPN  March 14, 2010 8:41 AM  MD Comments:  Agree with above.  Impression & Recommendations:  Problem # 1:  CARDIAC PACEMAKER IN SITU (ICD-V45.01) Her device is working normally.  Will recheck in several months.  Problem # 2:  ESSENTIAL HYPERTENSION, BENIGN (ICD-401.1) Her blood pressure is elevated.  She admits to some sodium indiscretion.  She will continue her current meds for now. Her updated medication list for this  problem includes:    Aspirin 325 Mg Tabs (Aspirin) .Marland Kitchen... Take 1 tab daily    Furosemide 40 Mg Tabs (Furosemide) .Marland Kitchen... Take 1 tab am and 1/2 pm    Metoprolol Succinate 50 Mg Xr24h-tab (Metoprolol succinate) .Marland Kitchen... Take 1 tab daily  Problem # 3:  HYPERLIPIDEMIA (ICD-272.4) She is instructed to maintain a low fat diet and continue her current meds. Her updated medication list for this problem includes:    Crestor 20 Mg Tabs (Rosuvastatin calcium) .Marland Kitchen... Take one tablet by mouth  daily.  Patient Instructions: 1)  Your physician recommends that you schedule a follow-up appointment in: 1 year

## 2010-09-03 NOTE — Letter (Signed)
Summary: [place] [facility], agricultural at [employer]  [address] [place] [address]   Phone: [phone]  Fax: [phone]     March 13, 2010 MRN: [id number]   [name] [address] [place], [place]  [address]      YOUR LAB WORK IS DUE  June 04, 2010 _________________________________________  Please go to [facility], located across the street from [facility] on the second floor.  Hours are Monday - Friday 7am until 7:30pm         Saturday 8am until 12noon    _X_  DO NOT EAT OR DRINK AFTER MIDNIGHT EVENING PRIOR TO LABWORK  __ YOUR LABWORK IS NOT FASTING --YOU MAY EAT PRIOR TO LABWORK

## 2010-09-03 NOTE — Assessment & Plan Note (Signed)
Summary: 1 mth f/u per checkout on 08/09/09/tg   Visit Type:  Follow-up Primary Provider:  Dr. Catalina Pizza   History of Present Illness: 75 year old woman presents for a followup visit. Since I last saw her, she was admitted to the hospital for observation with pleuritic chest pain. she ruled out for myocardial infarction and was treated symptomatically with nonsteroidal anti-inflammatory drugs. It was not clear that she had evidence of recurrent pericarditis, and followup echocardiography from January 6 had demonstrated an LVEF of 55-60% with no description of recurrent pericardial effusion. Chest x-ray during her hospital stay demonstrated no acute abnormalities.  Recent labs from January 11 revealed hemoglobin 11.2, WBC 4.6, platelets 173, potassium 3.6, BUN 12, creatinine 0.7, normal troponin I levels.  Since discharge from the hospital, this morning states that she has not had any significant chest pain. She states that she has seen Dr. Margo Aye in the interim. She reports compliance with her medications. Appetite has been stable.   Current Medications (verified): 1)  Potassium Chloride Crys Cr 20 Meq Cr-Tabs (Potassium Chloride Crys Cr) .... Take One Tablet By Mouth Daily Prn 2)  Crestor 20 Mg Tabs (Rosuvastatin Calcium) .... Take One Tablet By Mouth Daily. 3)  Folic Acid   Powd (Folic Acid) .Marland Kitchen.. 1 By Mouth Once Daily 4)  Calcium Carbonate   Powd (Calcium Carbonate) .Marland Kitchen.. 1 By Mouth Once Daily 5)  Alprazolam 0.5 Mg Tabs (Alprazolam) .... As Needed 6)  Nexium 40 Mg Cpdr (Esomeprazole Magnesium) .... Take 1 Tablet By Mouth Two Times A Day 7)  Aspirin 325 Mg Tabs (Aspirin) .... Take 1 Tab Daily 8)  Alph-E-Mixed 1000 1000 Unit Caps (Vitamin E) .... Take 1 Tablet By Mouth Once A Day 9)  Abc Plus  Tabs (Multiple Vitamins-Minerals) .... Take 1 Tablet By Mouth Once A Day 10)  Furosemide 20 Mg Tabs (Furosemide) .... Take 2 Tabs in Am and 1 Tab in Pm 11)  B Complex-B12  Tabs (B Complex Vitamins) ....  Take 1 Tablet By Mouth Once A Day 12)  Citalopram Hydrobromide 10 Mg Tabs (Citalopram Hydrobromide) .Marland Kitchen.. 1 Tab Once Daily 13)  Plavix 75 Mg Tabs (Clopidogrel Bisulfate) .... Take 1 Tab Daily 14)  Amiodarone Hcl 200 Mg Tabs (Amiodarone Hcl) .... Take 1 Tablet Once Daily 15)  Nitrostat 0.4 Mg Subl (Nitroglycerin) .... Take As Directed For Chest Pain 16)  Tramadol Hcl 50 Mg Tabs (Tramadol Hcl) .... Take As Needed For Pain 17)  Metoprolol Succinate 50 Mg Xr24h-Tab (Metoprolol Succinate) .... Take 1 Tab Daily 18)  Ondansetron 4 Mg Tbdp (Ondansetron) .... Take As Needed For Nausea 19)  Colcrys 0.6 Mg Tabs (Colchicine) .... Take 1 Tab Daily 20)  Dulcolax 10 Mg Supp (Bisacodyl) .... Take As Needed  Allergies (verified): 1)  ! Sulfa 2)  ! Amitriptyline Hcl 3)  ! * Perphenazine 4)  ! 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  Tobacco Use - No.  Alcohol Use - no   Review of Systems  The patient denies anorexia, fever, vision loss, chest pain, syncope, peripheral edema, prolonged cough, hemoptysis, melena, hematochezia, and severe  indigestion/heartburn.         Otherwise reviewed and negative.  Vital Signs:  Patient profile:   75 year old female Height:      66 inches Weight:      144 pounds O2 Sat:      95 % on Room air Pulse rate:   78 / minute BP sitting:   145 / 90  (left arm)  Vitals Entered By: Dreama Saa, CNA (September 13, 2009 1:02 PM)  O2 Flow:  Room air  Physical Exam  Additional Exam:  Patient  comfortable in no acute distress, resting tremor noted. HEENT: Conjunctiva and lids are normal, oropharynx clear. Neck: Supple no elevated jugular venous pressure or bruits. Lungs: Clear to auscultation with diminished breath sounds. Non-labored breathing at rest. Cardiac: Regular rate and rhythm without S3 gallop. No pericardial rub or knock. Thorax: Pacemaker pocket site on the left is stable and well healed. Subxiphoid incision well-healed. Extremities: Trace edema. Skin: Warm and dry. Musculoskeletal: Kyphosis noted. Neuropsychiatric: Alert and oriented x3. Affect appropriate.   Echocardiogram  Procedure date:  08/09/2009  Findings:       Study Conclusions    - Left ventricle: The cavity size was normal. There was mild     concentric hypertrophy. Systolic function was normal. The     estimated ejection fraction was in the range of 55% to 60%.   - Aortic valve: Mildly to moderately calcified annulus. Trileaflet;     mildly thickened, mildly calcified leaflets. Mild regurgitation.   - Mitral valve: Mildly to moderately calcified annulus. Mild     regurgitation.   - Left atrium: The atrium was mildly dilated.   - Right atrium: The atrium was mildly dilated.  PPM Specifications Following MD:  Lewayne Bunting, MD     PPM Vendor:  Medtronic     PPM Model Number:  ADDRL1     PPM Serial Number:  [id number] PPM DOI:  12/27/2008     PPM Implanting MD:  Lewayne Bunting, MD  Lead 1    Location: RA     DOI: 12/27/2008     Model #: [id number]     Serial #: [id number]     Status: active Lead 2    Location: RV     DOI: 12/27/2008     Model #: [id number]     Serial #: [id number]     Status: active  Magnet Response Rate:  BOL 85 ERI 65  Indications:  A-fib   PPM Follow Up Pacer Dependent:  No      Episodes Coumadin:  No  Parameters Mode:  DDDR     Lower Rate Limit:  60     Upper Rate Limit:  130 Paced AV Delay:  310     Sensed AV Delay:  310  Impression & Recommendations:  Problem # 1:  CORONARY  ATHEROSCLEROSIS NATIVE CORONARY ARTERY (ICD-414.01)  Symptomatically stable without active angina on present medical regimen. Recent hospitalization in January with normal cardiac markers, and normal left ventricle systolic function by followup echocardiography. Office visit in 3 months scheduled.  Her updated medication list for this problem includes:    Aspirin 325 Mg Tabs (Aspirin) .Marland Kitchen... Take 1 tab daily    Plavix 75 Mg Tabs (Clopidogrel bisulfate) .Marland Kitchen... Take 1 tab daily    Nitrostat 0.4 Mg Subl (Nitroglycerin) .Marland Kitchen... Take as directed for chest pain    Metoprolol Succinate 50 Mg Xr24h-tab (Metoprolol succinate) .Marland Kitchen... Take 1 tab daily  Problem # 2:  PERICARDIAL EFFUSION (ICD-423.9)  No obvious recurrence following pericardial window. Patient has stable NYHA class II dyspnea on exertion. Will continue to follow her clinically.  Her updated medication list for this problem includes:    Furosemide 20 Mg Tabs (Furosemide) .Marland Kitchen... Take 2 tabs in am and 1 tab in pm  Problem # 3:  ATRIAL FIBRILLATION (ICD-427.31)  Patient stable without palpitations. She continues on low-dose amiodarone. Followup labs from January showed AST 11, ALT 20, TSH 0.96.  Her updated medication list for this problem includes:    Aspirin 325 Mg Tabs (Aspirin) .Marland Kitchen... Take 1 tab daily    Plavix 75 Mg Tabs (Clopidogrel bisulfate) .Marland Kitchen... Take 1 tab daily    Amiodarone Hcl 200 Mg Tabs (Amiodarone hcl) .Marland Kitchen... Take 1 tablet once daily    Metoprolol Succinate 50 Mg Xr24h-tab (Metoprolol succinate) .Marland Kitchen... Take 1 tab daily  Problem # 4:  BRADYCARDIA-TACHYCARDIA SYNDROME (ICD-427.81)  Stable status post pacemaker placement.  Her updated medication list for this problem includes:    Aspirin 325 Mg Tabs (Aspirin) .Marland Kitchen... Take 1 tab daily    Plavix 75 Mg Tabs (Clopidogrel bisulfate) .Marland Kitchen... Take 1 tab daily    Amiodarone Hcl 200 Mg Tabs (Amiodarone hcl) .Marland Kitchen... Take 1 tablet once daily    Nitrostat 0.4 Mg Subl (Nitroglycerin) .Marland Kitchen... Take  as directed for chest pain    Metoprolol Succinate 50 Mg Xr24h-tab (Metoprolol succinate) .Marland Kitchen... Take 1 tab daily  Patient Instructions: 1)  Your physician recommends that you schedule a follow-up appointment in: 3 months 2)  Your physician recommends that you continue on your current medications as directed. Please refer to the Current Medication list given to you today.

## 2010-09-03 NOTE — Letter (Signed)
Summary: *Orthopedic No Show Letter  [facility]  [address]  [place], [place] [address]   Phone: [phone]  Fax: [phone]     09/13/2009   [name] [address] [place], [place]  [address]    Dear Ms. Belson,   Our records indicate that you missed your scheduled appointment with Dr. Beaulah Corin on 09/05/2009.  Please contact this office to reschedule your appointment as soon as possible.  It is important that you keep your scheduled appointments with your physician, so we can provide you the best care possible.  We have enclosed an appointment card for your convenience.      Sincerely,    Dr. Terrance Mass, MD [facility] Phone [phone]

## 2010-09-03 NOTE — Letter (Signed)
Summary: [place] [facility], agricultural at [facility]  [address], [place] [address]   Phone: [phone]  Fax: [phone]      Dec 26, 2009 MRN: [id number]   [name] [address] [place], [place]  [address]   Dear [name],  Your test ordered by Selena Batten has been reviewed by your physician (or physician assistant) and was found to be normal or stable. Your physician (or physician assistant) felt no changes were needed at this time.  ____ Echocardiogram  ____ Cardiac Stress Test  __X__ Lab Work  ____ Peripheral vascular study of arms, legs or neck  ____ CT scan or X-ray  ____ Lung or Breathing test  ____ Other: Please continue on current medical treatment.  Thank you.   Nona Dell, MD, F.A.C.C

## 2010-09-03 NOTE — Letter (Signed)
Summary: [place] [facility], agricultural at [employer]  [address] [place] [address]   Phone: [phone]  Fax: [phone]     June 13, 2010 MRN: [id number]   BOWEN GOYAL [address] [place], [place]  [address]      YOUR LAB WORK IS DUE  September 09, 2010 _________________________________________  Please go to [facility], located across the street from [facility] on the second floor.  Hours are Monday - Friday 7am until 7:30pm         Saturday 8am until 12noon    _X_  DO NOT EAT OR DRINK AFTER MIDNIGHT EVENING PRIOR TO LABWORK  __ YOUR LABWORK IS NOT FASTING --YOU MAY EAT PRIOR TO LABWORK

## 2010-09-03 NOTE — Consult Note (Signed)
Summary: Hospital Consultation Report - [facility] Consultation Report - [facility]   Imported By: Marylou Mccoy 08/29/2009 18:04:43  _____________________________________________________________________  External Attachment:    Type:   Image     Comment:   External Document

## 2010-09-03 NOTE — Progress Notes (Signed)
Summary: CHEST PAIN AND ARM PAIN/ SEEN PCP YESTERDAY  Phone Note Call from Patient Call back at Home Phone [phone]   Caller: PT Reason for Call: Talk to Nurse Summary of Call: S: PT STILL HAVINF PAIN IN CHEST AND ARMS, JUST WANTS TO TALK WITH NURSE. SHE SAW DR HALL YESTERDAY AND HE TOLD HER THAT SHE COULD HAVE SOME FLUID AROUND HER HEART. SHE JUST WANTS TO ASK SOME QUESTIONS. Initial call taken by: Faythe Ghee,  Dec 06, 2009 2:46 PM  Follow-up for Phone Call        Pt. c/o SOB and CP. Mrs. Hainer is advised to to take NTG up to 3 tablets (wait 5 mins. in between doses) and go to ED if pain is not better after 3rd NTG tab. She states she understands info given.  Follow-up by: Larita Fife Via LPN,  Dec 07, 9560 10:55 AM

## 2010-09-03 NOTE — Progress Notes (Signed)
Summary: SOB  Phone Note Call from Patient Call back at Home Phone [phone]   Caller: PT Reason for Call: Talk to Nurse Summary of Call: PT IS SOB  Initial call taken by: Faythe Ghee,  August 30, 2009 11:54 AM  Follow-up for Phone Call        pt is short of breath, over right breast chest quivering, right side hurts, pt has had death in the family and sickness also. Follow-up by: Teressa Lower RN,  August 30, 2009 2:33 PM

## 2010-09-03 NOTE — Progress Notes (Signed)
  Phone Note Call from Patient   Caller: [name] Reason for Call: Acute Illness Summary of Call: Konstantina CALLED AND REPORTED THAT HER DEFIBULATOR SHOCKED HER DURING THE NIGHT. I TALKED WITH TAMMY THE RN AND WAS INSTRUCTED TO TELL Krystale SHE SHOULD GO TO THE ED ANYTIME THE DEVICE SHOCKS HER HEART. I INSTRUCTED THE PATIENT TO GO TO THE ED. Initial call taken by: Alexis Goodell

## 2010-09-03 NOTE — Cardiovascular Report (Signed)
Summary: Office Visit   Office Visit   Imported By: Roderic Ovens 12/24/2009 16:11:01  _____________________________________________________________________  External Attachment:    Type:   Image     Comment:   External Document

## 2010-09-03 NOTE — Progress Notes (Signed)
Summary: Drug interaction  Phone Note Outgoing Call   Call placed by: Larita Fife Via LPN,  March 15, 2010 1:07 PM Details for Reason: Drug Interaction Summary of Call: Per Dr. Diona Browner, pt. is advised to stop taking Colchicine due to drug interaction with Amiodarone. Pt. expressed verbal understanding. Initial call taken by: Larita Fife Via LPN,  March 15, 2010 1:19 PM

## 2010-09-05 NOTE — Miscellaneous (Signed)
Summary: Dr. Ival Bible office taking care of pacemaker for surgery  Clinical Lists Changes

## 2010-09-05 NOTE — Miscellaneous (Signed)
Summary: Home care PT progress note  Home care PT progress note   Imported By: Jacklynn Ganong 08/30/2010 12:39:45  _____________________________________________________________________  External Attachment:    Type:   Image     Comment:   External Document

## 2010-09-05 NOTE — Consult Note (Signed)
Summary: [name]   [facility]   Imported By: Jacklynn Ganong 07/23/2010 11:19:28  _____________________________________________________________________  External Attachment:    Type:   Image     Comment:   External Document

## 2010-09-05 NOTE — Letter (Signed)
Summary: surg order RT shoulder RC repair sched 08/09/10  surg order RT shoulder RC repair sched 08/09/10   Imported By: Cammie Sickle 08/02/2010 17:49:25  _____________________________________________________________________  External Attachment:    Type:   Image     Comment:   External Document

## 2010-09-05 NOTE — Assessment & Plan Note (Signed)
Summary: 2 WK RE-CK RT SHOULDER/POST OP RC SURG 08/09/10/SEC HORIZ/CAF   Visit Type:  Follow-up Referring Provider:  Dr. Catalina Pizza Primary Provider:  Dr. Catalina Pizza  CC:  post op shoulder.  History of Present Illness: 75 year old female postop visit #2 status post open rotator cuff repair RIGHT shoulder  Date of surgery August 09, 2010  Operative findings large U-shaped rotator cuff tear the supraspinatus  Procedure open rotator cuff repair with margin convergence and one absorbable suture anchor.  Postop complication of chest pain patient was observed overnight discharged with a negative cardiac workup.  POD 18  Meds for pain: Norco 5mg  1 by mouth q 4 hrs as needed pain, not every 4 hrs.  This is a 2 week and we check of her shoulder and removal of sutures.  She is complaining of pain 5/10.  She injured her shoulder again when her daughter slammed lumbar excision target.  She is not happy with advanced home physical therapy  Allergies: 1)  ! Sulfa 2)  ! Amitriptyline Hcl 3)  ! * Perphenazine 4)  ! Adhesive Tape   Shoulder/Elbow Exam  General:    Well-developed, well-nourished, normal body habitus; no deformities, normal grooming.    Skin:    normal incision   Inspection:    No swelling   Palpation:    Tenderness of the right arm   Vascular:    Radial, ulnar, brachial, and axillary pulses 2+ and symmetric; capillary refill less than 2 seconds; no evidence of ischemia, clubbing, or cyanosis.    Sensory:    Gross sensation intact in the upper extremities.    Shoulder Exam:    Right:    Inspection:  Abnormal    Palpation:  Abnormal    Stability:  stable    Tenderness:  right bicipital groove   Other Orders: Post-Op Check (16109) Physical Therapy Referral (PT)  Patient Instructions: 1)  Start Outpatient therapy 2)  Continue sling for an additional 4 weeks 3)  May remove sling at mealtimes.  Keep arm close to the  body and do not externally rotate or  abduct the arm   Orders Added: 1)  Post-Op Check [99024] 2)  Physical Therapy Referral [PT]  Appended Document: 2 WK RE-CK RT SHOULDER/POST OP RC SURG 08/09/10/SEC HORIZ/CAF our nurse called the advance therapy, office, and they indicated the patient reached her short-term goals in the outpatient therapy. According to their protocols.  Note that the patient's sling was removed by the therapist and she was told to show she only had to wear one to 2 hours per day.  She should be in a sling for 4 more weeks with only removing the sling for meals

## 2010-09-05 NOTE — Miscellaneous (Signed)
Summary: call to insurer, no pre-cert required for out-pt surgery  Clinical Lists Changes  Contacted insurer [employer] re: out-patient surgery CPT [phone] / [phone]  ICD9 code 727.61 re: out-patient surgery scheduled on 08/09/10 at [facility] - spoke w/Olivia. No pre-cert required for out-patient procedure for in-network providers.

## 2010-09-05 NOTE — Miscellaneous (Signed)
Summary: [facility] for medical clearance/cleared 08/01/10  Clinical Lists Changes  faxed info again to  heart care for pt med clearance and spoke with Aurther Loft about date of surgery and need for pacemaker device to be turned off and then back on 08/09/10   Cardiology office sent letter stating pt is cleared for surgery to be imported in EMR, 08/01/10

## 2010-09-05 NOTE — Letter (Signed)
Summary: PRE-OP CLEARANCE FORM  PRE-OP CLEARANCE FORM   Imported By: Faythe Ghee 07/31/2010 16:44:47  _____________________________________________________________________  External Attachment:    Type:   Image     Comment:   External Document  Appended Document: PRE-OP CLEARANCE FORM Please refer to be appended office note originally from November 10. I appended the note on 16 December regarding preoperative assessment already.

## 2010-09-05 NOTE — Consult Note (Signed)
Summary: Consult DR McDOWELL Selena Batten  Consult DR McDOWELL Selena Batten   Imported By: Cammie Sickle 08/02/2010 17:48:27  _____________________________________________________________________  External Attachment:    Type:   Image     Comment:   External Document

## 2010-09-05 NOTE — Assessment & Plan Note (Signed)
Summary: ARTHROGRAM RESULTS BRINGING DISC/SEC HOR/BSF   Visit Type:  Follow-up Referring [name]:  Dr. Catalina Pizza Primary [name]:  Dr. Catalina Pizza  CC:  right shoulder pain.  History of Present Illness: I saw [name] in the office today for a 2 month followup visit.  She is a 75 years old woman with the complaint of:  right shoulder pain  Medications: Amiodarone, Metroprolol, Citalopram, Aspirin, Potassium, Furosemide, Plavix, Nexium, Crestor, Tylenol with Codeine for pain, some relief sometimes.  Suspected impingement syndrome possible small tear rotator cuff  Impression type II acromion normal glenohumeral joint  Today is here for Arthrogram results right shoulder taken [facility] 07/15/10, has pacemaker, on ASA and Plavix.   arthrogram RIGHT shoulder.  Review of arthrogram with report I agree that there is a full thickness rotator cuff tear.  Patient has been advised to have surgery or at physical therapy. She wants to have surgery. There are several issues related to this.  She will need to be off of the Plavix for one week. She will need a cardiology clearance.  Tentative schedule for January 6 pending cardiology approval for rotator cuff repair.  His benefit ratio, procedure versus nonoperative treatment. The patient patient has opted for surgical treatment here. She, understands she'll be in a sling for 6, weeks. She'll have therapy 3 months after that.   Allergies: 1)  ! Sulfa 2)  ! Amitriptyline Hcl 3)  ! * Perphenazine 4)  ! 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 Brother and sister with coronary artery disease  Social History: Last updated: 04/18/2009 She lives alone now and has an adopted daughter.  Tobacco Use - No.  Alcohol Use - no  Risk Factors: Smoking Status: never (04/18/2009)  Review of Systems       complains of neck pain, worsened with the increased pain in the RIGHT shoulder. She had neck x-isease at several, levels. She's had an MRI back in 2006 at nonsurgical disease. There is displaced narrowing at C3-C4, C4-C5 and C5-C6 without subluxation.  No other complaints other than cardiac-related pacemaker and need for Plavix  Physical Exam  Additional Exam:  Constitutional: vital signs see recorded values. General: normal development, nutrition, and grooming. No deformity. Body Habitus is medium. CDV: Observation and palpation was normal  Lymph: palpation of the lymph nodes were normal Skin: inspection and palpation of the skin revealed no abnormalities  Neuro: coordination: normal              DTR's normal              Sensation was normal  Psyche: Alert and oriented x 3. Mood was normal.  Affect: normal  MSK: Gait: normal   RIGHT shoulder inspection tenderness anterolateral deltoid.  A.c. joint nontender.  No swelling.  No warmth.  Range of motion passive forward elevation 130.  External rotation with the arm at the side to 50.  Abduction 90.  Strength assessment grade 4/5 supraspinatus grade 5 out of 5 infraspinatus grade 5 out of 5 subscapularis  Stable in abduction external rotation, no sulcus sign  LEFT shoulder inspection normal.  Range of motion 130 for elevation.  Strength normal.   Stability    Impression & Recommendations:  Problem # 1:  RUPTURE ROTATOR CUFF (ICD-727.61) Assessment Unchanged OPEN RIGHT ROTATOR CUFF REPAIR  Orders: Cardiology Referral (Cardiology) Primary Care Referral (Primary) Est. Patient Level IV (60630)  Patient Instructions: 1)  OPEN RIGHT ROTATOR CUFF REPAIR    2)  08/09/09 DOS 3)  Preop at [facility] short stay 08/06/10 at 10:45 am, take packet with you. 4)  Post op 1 in our office on 08/12/10 5)  ISSUES: PLAVIX, NEEDS TO BE OFF FOR 7 DAYS STP ASPIRIN AND PLAVIX ON 08/02/10; PACE MAKER needs to be cut off and on for surgery 6)  CARDIOLOGIST DR MCDOWELL NEED MEDICAL CLEARANCE  7)  PRIMARY CARE DR Dwana Melena MEDICAL CLEARANCE    Orders Added: 1)  Cardiology Referral [Cardiology] 2)  Primary Care Referral [Primary] 3)  Est. Patient Level IV [16010]

## 2010-09-05 NOTE — Assessment & Plan Note (Signed)
Summary: 33month.rm   Visit Type:  Follow-up Primary Provider:  Dr. Catalina Pizza   History of Present Illness: 75 year old woman presents for followup. She was seen back in August. Since that time she underwent an endoscopy and is being treated for esophageal spasm with low-dose Cardizem under the direction of [name]. She still has trouble with dysphagia at times and chest discomfort related to this. No obvious angina or progressive shortness of breath over her baseline. Also no palpitations or bleeding problems.  Followup labs from one November show cholesterol 204, triglycerides 146, HDL 53, LDL 122, AST 21, ALT 14. We discussed these today. Lipid numbers are improving on Crestor and omega-3 supplements. She also continues to work on her diet.  Clinical Review Panels:  Echocardiogram Echocardiogram  Study Conclusions    - Left ventricle: The cavity size was normal. There was mild     concentric hypertrophy. Systolic function was normal. The     estimated ejection fraction was in the range of 55% to 60%.   - Aortic valve: Mildly to moderately calcified annulus. Trileaflet;     mildly thickened, mildly calcified leaflets. Mild regurgitation.   - Mitral valve: Mildly to moderately calcified annulus. Mild     regurgitation.   - Left atrium: The atrium was mildly dilated.   - Right atrium: The atrium was mildly dilated. (08/09/2009)    Current Medications (verified): 1)  Potassium Chloride Crys Cr 20 Meq Cr-Tabs (Potassium Chloride Crys Cr) .... Take One Tablet By Mouth Daily Prn 2)  Crestor 20 Mg Tabs (Rosuvastatin Calcium) .... Take One Tablet By Mouth Daily. 3)  Alprazolam 0.5 Mg Tabs (Alprazolam) .... As Needed 4)  Nexium 40 Mg Cpdr (Esomeprazole Magnesium) .... Take 1 Tablet By Mouth Two Times A Day 5)  Aspirin 325 Mg Tabs (Aspirin) .... Take 1 Tab Daily 6)  Abc Plus  Tabs (Multiple Vitamins-Minerals) .... Take 1 Tablet By Mouth Once A Day 7)  Furosemide 40 Mg Tabs (Furosemide)  .... Take 1 Tab Am and 1/2 Pm 8)  B Complex-B12  Tabs (B Complex Vitamins) .... Take 1 Tablet By Mouth Once A Day 9)  Citalopram Hydrobromide 10 Mg Tabs (Citalopram Hydrobromide) .Marland Kitchen.. 1 Tab Once Daily 10)  Plavix 75 Mg Tabs (Clopidogrel Bisulfate) .... Take 1 Tab Daily 11)  Amiodarone Hcl 200 Mg Tabs (Amiodarone Hcl) .... Take 1 Tablet Once Daily 12)  Nitrostat 0.4 Mg Subl (Nitroglycerin) .... Take As Directed For Chest Pain 13)  Tramadol Hcl 50 Mg Tabs (Tramadol Hcl) .... Take As Needed For Pain 14)  Metoprolol Succinate 50 Mg Xr24h-Tab (Metoprolol Succinate) .... Take 1 Tab Daily 15)  Ondansetron 4 Mg Tbdp (Ondansetron) .... Take As Needed For Nausea 16)  Dulcolax 10 Mg Supp (Bisacodyl) .... Take As Needed 17)  Vitamin E 400 Unit Caps (Vitamin E) .... Take 1 Tab Daily 18)  Vitamin D 1000 Unit Tabs (Cholecalciferol) .... Take 1 Tab Daily 19)  Fish Oil 1000 Mg Caps (Omega-3 Fatty Acids) .... Take 1 Tablet By Mouth Once Daily 20)  Diltiazem Hcl 30 Mg Tabs (Diltiazem Hcl) .... Take 1 Tab Three Times A Day  Allergies: 1)  ! Sulfa 2)  ! Amitriptyline Hcl 3)  ! * Perphenazine 4)  ! 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  Tobacco Use - No.    Alcohol Use - no  Review of Systems       The patient complains of dyspnea on exertion.  The patient denies anorexia, fever, weight gain, syncope, peripheral edema, prolonged cough, hemoptysis, melena, hematochezia, and severe indigestion/heartburn.         Otherwise reviewed and negative except as outlined.  Vital Signs:  Patient profile:   75 year old female Weight:      140 pounds O2 Sat:      97 % on Room air Pulse rate:   75 / minute BP sitting:   139 / 83  (right arm)  Vitals Entered By: Dreama Saa, CNA (June 13, 2010 8:12 AM)  O2 Flow:  Room air  Physical Exam  Additional Exam:  Patient comfortable in no acute distress, resting tremor. HEENT: Conjunctiva and lids are normal, oropharynx clear. Neck: Supple no elevated jugular venous pressure or bruits. Lungs: Clear to auscultation with diminished breath sounds. Non-labored breathing at rest. Cardiac: Regular rate and rhythm without S3 gallop. No pericardial rub or knock. Extremities: Trace edema. Skin: Warm and dry. Musculoskeletal: Kyphosis noted. Neuropsychiatric: Alert and oriented x3. Affect appropriate.   PPM Specifications Following MD:  Lewayne Bunting, MD     PPM Vendor:  [employer]     PPM Model Number:  [id number]     PPM Serial Number:  [id number] PPM DOI:  12/27/2008     PPM Implanting MD:  Lewayne Bunting, MD  Lead 1    Location: [facility]     DOI: 12/27/2008     Model #: [id number]     Serial #: [id number]     Status: active Lead 2    Location: RV     DOI: 12/27/2008     Model #: [id number]     Serial #: [id number]     Status: active  Magnet Response Rate:  BOL 85 ERI 65  Indications:  A-fib   PPM Follow Up Pacer Dependent:  No      Episodes Coumadin:  No  Parameters Mode:  DDDR     Lower Rate Limit:  60     Upper Rate Limit:  130 Paced AV Delay:  310     Sensed AV Delay:  310  Impression & Recommendations:  Problem # 1:  CORONARY ATHEROSCLEROSIS NATIVE CORONARY ARTERY (ICD-414.01)  Symptomatically stable on  medical therapy. No active angina at this time. She continues on aspirin and Plavix. No medication changes made today. I will see her back in 3 months.  Her updated medication list for this problem includes:    Aspirin 325 Mg Tabs (Aspirin) .Marland Kitchen... Take 1 tab daily    Plavix 75 Mg Tabs (Clopidogrel bisulfate) .Marland Kitchen... Take 1 tab daily    Nitrostat 0.4 Mg Subl (Nitroglycerin) .Marland Kitchen... Take as directed for chest pain    Metoprolol Succinate 50 Mg Xr24h-tab (Metoprolol succinate) .Marland Kitchen... Take 1 tab daily    Diltiazem Hcl 30 Mg Tabs (Diltiazem hcl) .Marland Kitchen... Take 1 tab three times a day  Problem # 2:  HYPERLIPIDEMIA (ICD-272.4)  Lipid numbers are improving. Continue Crestor  and omega-3 supplements. Followup fasting lipid profile and liver function tests for her next visit.  Her updated medication list for this problem includes:    Crestor 20 Mg Tabs (Rosuvastatin calcium) .Marland Kitchen... Take one tablet by mouth daily.  Future Orders: T-Lipid Profile [phone]) ... 09/09/2010 T-Hepatic Function [phone]) ... 09/09/2010  Problem # 3:  ESSENTIAL HYPERTENSION, BENIGN (ICD-401.1)  No changes made today. I note that she is now on low-dose Cardizem, principally for esophageal spasm. Will follow blood pressure trend.  Her updated medication list for this problem includes:    Aspirin 325 Mg Tabs (Aspirin) .Marland Kitchen... Take 1 tab daily    Furosemide 40 Mg Tabs (Furosemide) .Marland Kitchen... Take 1 tab am and 1/2 pm    Metoprolol Succinate 50 Mg Xr24h-tab (Metoprolol succinate) .Marland Kitchen... Take 1 tab daily    Diltiazem Hcl 30 Mg Tabs (Diltiazem hcl) .Marland Kitchen... Take 1 tab three times a day  Patient Instructions: 1)  Your physician recommends that you schedule a follow-up appointment in: 3 months 2)  Your physician recommends that you return for lab work in: 3 months, just before next office visit 3)  Your physician recommends that you continue on your current medications as directed. Please refer to the Current Medication list given to you  today.  Appended Document: 26month.rm Recently received communication regarding preoperative assessment. Patient is being considered for rotator cuff surgery under general anesthesia in early January. As noted above, I recently saw her in the office in November, at which time she was clinically stable. Dr. Mort Sawyers note indicates request to interrupt aspirin and Plavix for one week prior to surgery. She is greater than one year out from placement of drug-eluting stents in the right coronary artery (October 2010). If she remains clinical stable, she should be able to reasonably interrupt antiplatelet therapy temporarily as requested. She does not require any followup ischemic testing at this time. We can certainly see her in consultation if the situation arises. Will otherwise let [employer] representative know to have the patient's pacemaker interrogated perioperatively.

## 2010-09-05 NOTE — Consult Note (Signed)
Summary: Consult Dr Dietrich Pates    Consult Dr Dietrich Pates    Imported By: Cammie Sickle 08/12/2010 09:59:03  _____________________________________________________________________  External Attachment:    Type:   Image     Comment:   External Document

## 2010-09-05 NOTE — Assessment & Plan Note (Signed)
Summary: POST OP 1/RT RC SURGERY 08/09/09/SEC HORIZ/CAF   Visit Type:  Follow-up Referring Provider:  Dr. Catalina Pizza Primary Provider:  Dr. Catalina Pizza  CC:  post op 1 shoulder.  History of Present Illness: I saw [name] in the office today for a followup visit.  She is a 75 years old woman with the complaint of:  post op 1 open rotator cuff repair right shoulder.  Large, U-shaped rotator cuff tear, supraspinatus, repaired with one anchor absorbable   DOS 08/09/10.  Discharged 08/10/09 due to post op chest pain.  POD 3.  Meds for pain: Norco 5mg  1 by mouth q 4 hrs as needed pain.  Doing well no pain.  She was admitted as an extended stay patient secondary to chest pain after surgery. Cardiac enzymes were done and they were normal. 24 hour monitoring was normal. No evidence of ischemia.  Incision looks good. Steri-Strips were applied, displaced in a sling.  Start physical therapy at home.  Follow up 2 weeks wound check       Allergies: 1)  ! Sulfa 2)  ! Amitriptyline Hcl 3)  ! * Perphenazine 4)  ! Adhesive Tape   Impression & Recommendations:  Problem # 1:  RUPTURE ROTATOR CUFF (ICD-727.61) Assessment Comment Only  Orders: Post-Op Check (78295)  Problem # 2:  AFTERCARE FOLLOW SURGERY MUSCULOSKEL SYSTEM NEC (ICD-V58.78) Assessment: Comment Only  Orders: Post-Op Check (62130)  Patient Instructions: 1)  Please schedule a follow-up appointment in 2 weeks. 2)  WOUND CHECK IN 2 WEEKS   Orders Added: 1)  Post-Op Check [86578]

## 2010-09-05 NOTE — Letter (Signed)
Summary: Cardiology note from Dr. Gracy Racer  Cardiology note from Dr. Gracy Racer   Imported By: Jacklynn Ganong 08/08/2010 15:10:26  _____________________________________________________________________  External Attachment:    Type:   Image     Comment:   External Document

## 2010-09-05 NOTE — Miscellaneous (Signed)
Summary: Dr. Margo Aye med clearance appt 08/06/10 2pm pt aware  Clinical Lists Changes

## 2010-09-05 NOTE — Letter (Signed)
Summary: Medical clearance for surgery  Medical clearance for surgery   Imported By: Jacklynn Ganong 08/08/2010 15:24:21  _____________________________________________________________________  External Attachment:    Type:   Image     Comment:   External Document

## 2010-09-05 NOTE — Miscellaneous (Signed)
Summary: faxed Dr. Margo Aye and Diona Browner for med clerarnce for surg  Clinical Lists Changes

## 2010-09-05 NOTE — Miscellaneous (Signed)
Summary: Home care plan of care  Home care plan of care   Imported By: Jacklynn Ganong 08/30/2010 12:38:58  _____________________________________________________________________  External Attachment:    Type:   Image     Comment:   External Document

## 2010-09-05 NOTE — Letter (Signed)
Summary: Surgical clearnace from Dr. Dwana Melena  Surgical clearnace from Dr. Dwana Melena   Imported By: Jacklynn Ganong 08/08/2010 15:16:21  _____________________________________________________________________  External Attachment:    Type:   Image     Comment:   External Document

## 2010-09-05 NOTE — Letter (Signed)
Summary: [place] [facility]  [facility] REC[facility]DS   Imported By: Faythe Ghee 07/19/2010 15:55:33  _____________________________________________________________________  External Attachment:    Type:   Image     Comment:   External Document

## 2010-09-06 ENCOUNTER — Encounter: Payer: Self-pay | Admitting: Internal Medicine

## 2010-09-06 ENCOUNTER — Encounter (INDEPENDENT_AMBULATORY_CARE_PROVIDER_SITE_OTHER): Payer: MEDICARE

## 2010-09-06 ENCOUNTER — Ambulatory Visit: Admit: 2010-09-06 | Payer: Self-pay | Admitting: Internal Medicine

## 2010-09-06 DIAGNOSIS — I495 Sick sinus syndrome: Secondary | ICD-10-CM

## 2010-09-09 NOTE — Consult Note (Addendum)
[name], [name]                  ACCOUNT NO.:  [id number]  MEDICAL RECORD NO.:  [id number]          PATIENT TYPE:  OIB  LOCATION:  A202                          FACILITY:  [facility]  PHYSICIAN:  Gerrit Friends. Dietrich Pates, MD, FACCDATE OF BIRTH:  Nov 26, 1935  DATE OF CONSULTATION:  08/09/2010 DATE OF DISCHARGE:                                CONSULTATION   PRIMARY CARDIOLOGIST:  Jonelle Sidle, MD  PRIMARY CARE PHYSICIAN:  Dr. Shelva Majestic.  REQUESTING PHYSICIAN:  Vickki Hearing, MD  REASON FOR CONSULTATION:  Chest pain.  HISTORY OF PRESENT ILLNESS:  [name] is a 75 year old Caucasian female who was admitted today for outpatient rotator cuff repair on the right per Dr. Romeo Apple postoperatively.  [name] began to have some right-sided chest pain and mid upper gastric pain, described as an ache, lasting approximately 1 hour constant with no other radiation.  She was given one sublingual nitroglycerin and dose of fentanyl with improvement in symptoms, but not complete alleviation.  We are asked to see her with known history of CAD, status post PCI with the drug-eluting stent to the proximal and mid right coronary artery in August 2010.  The patient did hold her Plavix for 3 days prior to the surgery, but did take her amiodarone and metoprolol as directed.  She was seen by Dr. Diona Browner in December 2011, for preop evaluation and was cleared for surgery at that time.  The patient has had no other complaints of chest pain up until this moment postoperatively.  The patient did not have any trouble breathing.  She was not diaphoretic.  She was not dizzy and she did not have any nausea.  The patient states that the pain is becoming more and more relieved as time passes, now she describes it into her abdomen and no longer in her chest.  REVIEW OF SYSTEMS:  Positive for chest pain, abdominal pain, right shoulder pain postoperatively, GERD and abdominal pain.  All other systems are  reviewed and are found to be negative unless discussed above.  CODE STATUS:  Full.  PAST MEDICAL HISTORY: 1. CAD.     a.     Status post cardiac catheterization October 2010, with left      main 30-40%, LAD 60% stenosis.  Her vessel become smaller with no      flow limiting disease, plaque of uncertain percentage in the      circumflex, right coronary artery diffuse 40% midportion, 30%      discrete, 75% lesion with an EF of 65%.     b.     Status post PCI using drug-eluting stent in the proximal and      mid right coronary artery in October 2010. 2. Pericarditis, 2010. 3. Cardiac tamponade with pericardial window in October 2010. 4. Tachy-brady syndrome, status post pacemaker implantation in 2010. 5. Atrial fibrillation not a Coumadin candidate. 6. Anxiety. 7. COPD. 8. Essential tremor. 9. [name] syndrome.  SOCIAL HISTORY:  She lives in [place] with her daughter.  She is retired.  She is married, but estranged from her husband.  She is a former  smoker.  Negative for EtOH or drug use.  FAMILY HISTORY:  Mother with colon cancer and brother with brain cancer. Other family members with cancer as well.  CURRENT MEDICATIONS PRIOR TO ADMISSION: 1. Folic acid. 2. Calcium. 3. Vitamin B12. 4. Fish oil 1 g daily. 5. Vitamin E. 6. Vitamin D. 7. Citalopram 10 mg daily. 8. Metoprolol 50 mg daily. 9. Nexium 40 mg b.i.d. 10.Calcium 40 mg b.i.d. 11.Plavix 75 mg daily. 12.Amiodarone 200 mg daily. 13.Crestor 20 mg daily. 14.Aspirin 81 mg daily. 15.Lasix 20 mg daily. 16.Xanax p.r.n. 17.Nitroglycerin p.r.n.  ALLERGIES:  To SULFA and ADHESIVE TAPE.  LABORATORY DATA:  Sodium 138, potassium 4.0, chloride 104, CO2 27, BUN 9, creatinine 0.70, glucose 90.  Hemoglobin 12.8, hematocrit 38.3, white blood cells 6.3, platelets 197, BNP 111.1, troponin 0.01, magnesium 1.8.  EKG revealing atrial paced and sensed rhythm rate of 61 beats per minute, QTC mildly prolonged at 0.493.    Chest  x-ray mild atelectasis,stable cardiomegaly.  No other acute findings.   Pacemaker interrogationcompleted postoperatively revealed that this is a  Medtronic adapted DR,date of insertion Dec 27, 2008, percentage paced 95%  as A paced Vsensed; A paced, V paced at 4%; A sensed, V sensed at 1%.  She had 2 episodes of high rate both noise seen during surgery with underlying sinus bradycardia of 53 beats per minute.  The lead measurements were found to be within normal limits with no changes made to the system.  PHYSICAL EXAMINATION:  VITAL SIGNS:  Blood pressure 131/71, pulse 60, respirations 19, temperature 97.4, O2 sats 99% on 2 L. GENERAL:  She is awake, alert, oriented, in no acute distress. HEENT:  Head is normocephalic and atraumatic.  Eyes, PERRLA.  Mucous membranes of mouth pink and moist.  Tongue is midline. NECK:  Supple without JVD or carotid bruits. CARDIOVASCULAR:  Regular rate and rhythm with 1/6 systolic murmur auscultated.  Pulses are 2+ and equal without bruits.  LUNGS:  Clear to auscultation without wheezes, rales or rhonchi. ABDOMEN:  Soft with positive tenderness in the lower quadrants with 2+ bowel sounds. EXTREMITIES:  Right arm and shoulder pain as also in a sling.  There is no edema noted. NEURO:  Cranial nerves II through XII are grossly intact with exception of an essential tremor which is chronic for her.  IMPRESSION: 1. Atypical cardiac pain occurring in the right upper chest and into     the right epigastric area, status post rotator cuff repair.  Pain     relieved within fentanyl and nitroglycerin, negative EKG and     initial cardiac enzymes appears to be radiation musculoskeletal     pain versus gastrointestinal.  She also has a history of [name]     syndrome and gastroesophageal reflux disease.  Same pain is now     elicited, but in the lower abdomen.  We will give one dose of     Mylanta if flatus is an issue. 2. Coronary artery disease, status post  percutaneous coronary     intervention to the proximal mid right coronary artery in October     2010.  Cardiac followup without any further cardiac issues.  She     was seen by Dr. Diona Browner in December 2011, and was found to be     stable from a cardiac standpoint. 3. Anxiety.  This could be contributing to her symptoms.  PLAN:  The patient should be kept overnight to rule out myocardial infarction with known history of CAD.  Initial enzymes are negative.  If they are positive, would recommend transfer to [facility] to be scheduled for cardiac catheterization, would restart Plavix as soon as possible postoperatively.  However, she is greater than 1 year post stent placement.  This can be readdressed by Dr. Dietrich Pates on his note following this dictation.  On behalf of the physicians and providers of [facility], we would like to thank Dr. Romeo Apple and Dr. Margo Aye for allowing [place] to participate in the care of this patient.     Bettey Mare. Lyman Bishop, NP   ______________________________ Gerrit Friends. Dietrich Pates, MD, [facility]    KML/MEDQ  D:  08/09/2010  T:  08/10/2010  Job:  [id number]  cc:   Vickki Hearing, M.D. Fax: [phone]  Catalina Pizza, M.D. Fax: [phone]  Electronically Signed by Joni Reining NP on 08/15/2010 03:54:22 PM Electronically Signed by Denmark Bing MD [facility] on 09/09/2010 08:15:03 AM

## 2010-09-10 ENCOUNTER — Ambulatory Visit (HOSPITAL_COMMUNITY): Payer: MEDICARE | Admitting: Specialist

## 2010-09-10 ENCOUNTER — Encounter: Payer: Self-pay | Admitting: Cardiology

## 2010-09-10 LAB — CONVERTED CEMR LAB
ALT: 11 units/L (ref 0–35)
Alkaline Phosphatase: 60 units/L (ref 39–117)
Bilirubin, Direct: 0.1 mg/dL (ref 0.0–0.3)
Cholesterol: 202 mg/dL — ABNORMAL HIGH (ref 0–200)
Indirect Bilirubin: 0.2 mg/dL (ref 0.0–0.9)
VLDL: 36 mg/dL (ref 0–40)

## 2010-09-16 ENCOUNTER — Ambulatory Visit (INDEPENDENT_AMBULATORY_CARE_PROVIDER_SITE_OTHER): Payer: MEDICARE | Admitting: Internal Medicine

## 2010-09-18 ENCOUNTER — Ambulatory Visit: Payer: Self-pay | Admitting: Cardiology

## 2010-09-19 NOTE — Letter (Signed)
Summary: [employer] Results Engineer, agricultural at [facility]  [address], [place] [address]   Phone: [phone]  Fax: [phone]      September 10, 2010 MRN: [id number]   NAIOMY WATTERS [address] [place], [place]  [address]   Dear Ms. Zelek,  Your test ordered by Selena Batten has been reviewed by your physician (or physician assistant) and was found to be normal or stable. Your physician (or physician assistant) felt no changes were needed at this time.  ____ Echocardiogram  ____ Cardiac Stress Test  __X__ Lab Work  ____ Peripheral vascular study of arms, legs or neck  ____ CT scan or X-ray  ____ Lung or Breathing test  ____ Other:  Please continue on current medical treatment.   Thank you.   Nona Dell, MD, F.A.C.C

## 2010-09-19 NOTE — Procedures (Signed)
Summary: [facility] Device [facility]   Current Medications (verified): 1)  Potassium Chloride Crys Cr 20 Meq Cr-Tabs (Potassium Chloride Crys Cr) .... Take One Tablet By Mouth Daily Prn 2)  Crestor 20 Mg Tabs (Rosuvastatin Calcium) .... Take One Tablet By Mouth Daily. 3)  Alprazolam 0.5 Mg Tabs (Alprazolam) .... As Needed 4)  Nexium 40 Mg Cpdr (Esomeprazole Magnesium) .... Take 1 Tablet By Mouth Two Times A Day 5)  Aspirin 325 Mg Tabs (Aspirin) .... Take 1 Tab Daily 6)  Abc Plus  Tabs (Multiple Vitamins-Minerals) .... Take 1 Tablet By Mouth Once A Day 7)  Furosemide 40 Mg Tabs (Furosemide) .... Take 1 Tab Am and 1/2 Pm 8)  B Complex-B12  Tabs (B Complex Vitamins) .... Take 1 Tablet By Mouth Once A Day 9)  Citalopram Hydrobromide 10 Mg Tabs (Citalopram Hydrobromide) .Marland Kitchen.. 1 Tab Once Daily 10)  Plavix 75 Mg Tabs (Clopidogrel Bisulfate) .... Take 1 Tab Daily 11)  Amiodarone Hcl 200 Mg Tabs (Amiodarone Hcl) .... Take 1 Tablet Once Daily 12)  Nitrostat 0.4 Mg Subl (Nitroglycerin) .... Take As Directed For Chest Pain 13)  Tramadol Hcl 50 Mg Tabs (Tramadol Hcl) .... Take As Needed For Pain 14)  Metoprolol Succinate 50 Mg Xr24h-Tab (Metoprolol Succinate) .... Take 1 Tab Daily 15)  Ondansetron 4 Mg Tbdp (Ondansetron) .... Take As Needed For Nausea 16)  Dulcolax 10 Mg Supp (Bisacodyl) .... Take As Needed 17)  Vitamin E 400 Unit Caps (Vitamin E) .... Take 1 Tab Daily 18)  Vitamin D 1000 Unit Tabs (Cholecalciferol) .... Take 1 Tab Daily 19)  Fish Oil 1000 Mg Caps (Omega-3 Fatty Acids) .... Take 1 Tablet By Mouth Once Daily 20)  Diltiazem Hcl 30 Mg Tabs (Diltiazem Hcl) .... Take 1 Tab Three Times A Day 21)  Tylenol With Codeine #3 300-30 Mg Tabs (Acetaminophen-Codeine) .Marland Kitchen.. 1 Every 4 Hours As Needed  Allergies (verified): 1)  ! Sulfa 2)  ! Amitriptyline Hcl 3)  ! * Perphenazine 4)  ! Adhesive Tape  PPM Specifications Following MD:  Lewayne Bunting, MD     PPM Vendor:  Medtronic     PPM Model Number:   [id number]     PPM Serial Number:  [id number] [facility] PPM DOI:  12/27/2008     PPM Implanting MD:  Lewayne Bunting, MD  Lead 1    Location: RA     DOI: 12/27/2008     Model #: [id number]     Serial #: [id number]     Status: active Lead 2    Location: RV     DOI: 12/27/2008     Model #: [id number]     Serial #: [id number]     Status: active  Magnet Response Rate:  BOL 85 ERI 65  Indications:  A-fib   PPM Follow Up Remote Check?  No Battery Voltage:  2.79 V     Battery Est. Longevity:  13 years     Pacer Dependent:  No       PPM Device Measurements Atrium  Amplitude: 1.0 mV, Impedance: 425 ohms, Threshold: 0.75 V at 0.4 msec Right Ventricle  Amplitude: 11.20 mV, Impedance: 592 ohms, Threshold: 0.75 V at 0.4 msec  Episodes MS Episodes:  0     Percent Mode Switch:  0     Coumadin:  No Ventricular High Rate:  0     Atrial Pacing:  99.7%3.2%      Parameters Mode:  DDDR     Lower Rate Limit:  60  Upper Rate Limit:  130 Paced AV Delay:  310     Sensed AV Delay:  310 Next Cardiology Appt Due:  03/05/2011 Tech Comments:  No parameter changes.  Device function normal.  No Carelink @ this time. ROV 6 months with Dr. Ladona Ridgel in [place]. [name], LPN  September 06, 2010 9:37 AM

## 2010-09-25 ENCOUNTER — Ambulatory Visit (INDEPENDENT_AMBULATORY_CARE_PROVIDER_SITE_OTHER): Payer: MEDICARE | Admitting: Cardiology

## 2010-09-25 ENCOUNTER — Encounter: Payer: Self-pay | Admitting: Cardiology

## 2010-09-25 DIAGNOSIS — I4891 Unspecified atrial fibrillation: Secondary | ICD-10-CM

## 2010-09-25 DIAGNOSIS — I2510 Atherosclerotic heart disease of native coronary artery without angina pectoris: Secondary | ICD-10-CM

## 2010-09-25 DIAGNOSIS — I495 Sick sinus syndrome: Secondary | ICD-10-CM

## 2010-09-25 DIAGNOSIS — E782 Mixed hyperlipidemia: Secondary | ICD-10-CM

## 2010-10-01 NOTE — Letter (Signed)
Summary: [place] [employer] Work Engineer, agricultural at [employer]  [address] [place] [address]   Phone: [phone]  Fax: [phone]     September 25, 2010 MRN: [id number]   [name] [address] [place], [place]  [address]      YOUR LAB WORK IS DUE  March 10, 2011 _________________________________________  Please go to [facility], located across the street from [facility] on the second floor.  Hours are Monday - Friday 7am until 7:30pm         Saturday 8am until 12noon    _X_  DO NOT EAT OR DRINK AFTER MIDNIGHT EVENING PRIOR TO LABWORK  __ YOUR LABWORK IS NOT FASTING --YOU MAY EAT PRIOR TO LABWORK

## 2010-10-01 NOTE — Assessment & Plan Note (Signed)
Summary: 3 mth f/u per checkout on 06/13/10/tg/nw/ v/m to call back ab...   Visit Type:  Follow-up Primary Provider:  Dr. Dwana Melena   History of Present Illness: 75 year old woman presents for followup. Since I last saw her, she underwent right rotator cuff surgery under general anesthesia, no obvious perioperative complications. She was seen in consultation by our service following surgery, had normal troponin levels, and a followup echocardiogram showing normal LVEF of 55-60%. She denies any significant angina or unusual shortness of breath of late.  Labs from 6 February showed cholesterol 202, triglyceride 182, HDL 52, LDL 114, AST 16, ALT 11. This is a positive trend with better LDL control, although not optimal as yet. She reports compliance with Crestor and omega-3 supplements.  She denies any significant palpitations, seemingly with good control of atrial fibrillation over time. Last device interrogation by Dr. Ladona Ridgel and revealed no significant concerns.  Patient states that she has a pending neurological consultation with [name] related to long-standing resting tremors. She states that these tremors have been present for several years, waxing and waning, and they do not seem to be any worse while on amiodarone. We did discuss this some today.  Current Medications (verified): 1)  Potassium Chloride Crys Cr 20 Meq Cr-Tabs (Potassium Chloride Crys Cr) .... Take One Tablet By Mouth Daily Prn 2)  Crestor 20 Mg Tabs (Rosuvastatin Calcium) .... Take One Tablet By Mouth Daily. 3)  Alprazolam 0.5 Mg Tabs (Alprazolam) .... As Needed 4)  Nexium 40 Mg Cpdr (Esomeprazole Magnesium) .... Take 1 Tablet By Mouth Two Times A Day 5)  Aspirin 325 Mg Tabs (Aspirin) .... Take 1 Tab Daily 6)  Abc Plus  Tabs (Multiple Vitamins-Minerals) .... Take 1 Tablet By Mouth Once A Day 7)  Furosemide 40 Mg Tabs (Furosemide) .... Take 1 Tab Am and 1/2 Pm 8)  B Complex-B12  Tabs (B Complex Vitamins) .... Take 1  Tablet By Mouth Once A Day 9)  Citalopram Hydrobromide 10 Mg Tabs (Citalopram Hydrobromide) .Marland Kitchen.. 1 Tab Once Daily 10)  Plavix 75 Mg Tabs (Clopidogrel Bisulfate) .... Take 1 Tab Daily 11)  Amiodarone Hcl 200 Mg Tabs (Amiodarone Hcl) .... Take 1 Tablet Once Daily 12)  Nitrostat 0.4 Mg Subl (Nitroglycerin) .... Take As Directed For Chest Pain 13)  Tramadol Hcl 50 Mg Tabs (Tramadol Hcl) .... Take As Needed For Pain 14)  Metoprolol Succinate 50 Mg Xr24h-Tab (Metoprolol Succinate) .... Take 1 Tab Daily 15)  Ondansetron 4 Mg Tbdp (Ondansetron) .... Take As Needed For Nausea 16)  Dulcolax 10 Mg Supp (Bisacodyl) .... Take As Needed 17)  Vitamin E 400 Unit Caps (Vitamin E) .... Take 1 Tab Daily 18)  Vitamin D 1000 Unit Tabs (Cholecalciferol) .... Take 1 Tab Daily 19)  Fish Oil 1000 Mg Caps (Omega-3 Fatty Acids) .... Take 1 Tablet By Mouth Once Daily 20)  Diltiazem Hcl 30 Mg Tabs (Diltiazem Hcl) .... Take 1 Tab Three Times A Day 21)  Tylenol With Codeine #3 300-30 Mg Tabs (Acetaminophen-Codeine) .Marland Kitchen.. 1 Every 4 Hours As Needed 22)  Folic Acid 400 Mcg Tabs (Folic Acid) .... Take 1 Tab Daily  Allergies (verified): 1)  ! Sulfa 2)  ! Amitriptyline Hcl 3)  ! * Perphenazine 4)  ! Adhesive Tape  Comments:  Nurse/Medical Assistant: patient brought meds she uses [facility]  Past History:  Past Medical History: Last updated: 05/08/2010 Atrial Fibrillation CAD - DES x 2 to RCA 10/10 G E R D Tachycardia-bradycardia syndrome - s/p  Medtronic Adapta L dual chamber device 5/10 Hyperlipidemia C O P D Anxiety Resting tremor Hemorrhagic pericardial effusion, fibrinous pericarditis, requiring subxiphoid window, November 2010 Presumed [name] syndrome with microperforation Back problems  Past Surgical History: Last updated: 05/08/2010 Esophagogastroduodenoscopy with esophageal dilatation - 2004, 2006, 2007 Subxiphoid pericardial window, November 2010 Appendectomy Cholecystectomy  Social  History: Last updated: 04/18/2009 She lives alone now and has an adopted daughter.  Tobacco Use - No.  Alcohol Use - no  Review of Systems  The patient denies anorexia, fever, weight gain, chest pain, syncope, dyspnea on exertion, peripheral edema, melena, and hematochezia.         Otherwise reviewed and negative except as outlined above.  Vital Signs:  Patient profile:   75 year old female Weight:      142 pounds BMI:     23.72 Pulse rate:   77 / minute BP sitting:   134 / 77  (left arm)  Vitals Entered By: Dreama Saa, CNA (September 25, 2010 1:50 PM)  Physical Exam  Additional Exam:  Patient comfortable in no acute distress. HEENT: Conjunctiva and lids are normal, oropharynx clear. Neck: Supple no elevated jugular venous pressure or bruits. Lungs: Clear to auscultation with diminished breath sounds. Non-labored breathing at rest. Cardiac: Regular rate and rhythm without S3 gallop. No pericardial rub or knock. Abdomen: Soft, nontender, bowel sounds present Extremities: Trace edema. Skin: Warm and dry. Musculoskeletal: Kyphosis noted. Well-healed surgical incision right shoulder. Neuropsychiatric: Alert and oriented x3. Affect appropriate. Resting tremor.   EKG  Procedure date:  08/10/2010  Findings:      Atrial paced rhythm at 64 beats per minute, nonspecific T wave changes.  CXR  Procedure date:  08/09/2010  Findings:      Comparison: 03/11/2010 and earlier.    Findings: AP portable upright view at 1111 hours.  Stable left   chest cardiac pacemaker. Stable cardiomegaly and mediastinal   contours.  Fairly stable lung volumes.  No pneumothorax, pulmonary   edema, pleural effusion or consolidation.  Mild infrahilar and   basilar curvilinear opacity.    IMPRESSION:   Mild atelectasis.  Stable cardiomegaly and no other acute findings.  PPM Specifications Following MD:  Lewayne Bunting, MD     PPM Vendor:  Medtronic     PPM Model Number:  ADDRL1     PPM Serial  Number:  [id number] PPM DOI:  12/27/2008     PPM Implanting MD:  Lewayne Bunting, MD  Lead 1    Location: RA     DOI: 12/27/2008     Model #: 5638     Serial #: [id number]     Status: active Lead 2    Location: RV     DOI: 12/27/2008     Model #: 8841     Serial #: [id number]     Status: active  Magnet Response Rate:  BOL 85 ERI 65  Indications:  A-fib   PPM Follow Up Pacer Dependent:  No      Episodes Coumadin:  No  Parameters Mode:  DDDR     Lower Rate Limit:  60     Upper Rate Limit:  130 Paced AV Delay:  310     Sensed AV Delay:  310  Impression & Recommendations:  Problem # 1:  CORONARY ATHEROSCLEROSIS NATIVE CORONARY ARTERY (ICD-414.01)  Symptomatically stable. Plan to continue present medications, back on dual antiplatelet therapy. Recent echocardiogram shows normal left ventricular systolic function. Routine followup scheduled for 6 months, sooner  if needed.  Her updated medication list for this problem includes:    Aspirin 325 Mg Tabs (Aspirin) .Marland Kitchen... Take 1 tab daily    Plavix 75 Mg Tabs (Clopidogrel bisulfate) .Marland Kitchen... Take 1 tab daily    Nitrostat 0.4 Mg Subl (Nitroglycerin) .Marland Kitchen... Take as directed for chest pain    Metoprolol Succinate 50 Mg Xr24h-tab (Metoprolol succinate) .Marland Kitchen... Take 1 tab daily    Diltiazem Hcl 30 Mg Tabs (Diltiazem hcl) .Marland Kitchen... Take 1 tab three times a day  Problem # 2:  ATRIAL FIBRILLATION (ICD-427.31)  Well-controlled on amiodarone. Patient not on Coumadin with prior history of hemorrhagic pericarditis requiring window. Followup TSH and liver function tests for next visit. Recent chest x-ray largely unremarkable.  Her updated medication list for this problem includes:    Aspirin 325 Mg Tabs (Aspirin) .Marland Kitchen... Take 1 tab daily    Plavix 75 Mg Tabs (Clopidogrel bisulfate) .Marland Kitchen... Take 1 tab daily    Amiodarone Hcl 200 Mg Tabs (Amiodarone hcl) .Marland Kitchen... Take 1 tablet once daily    Metoprolol Succinate 50 Mg Xr24h-tab (Metoprolol succinate) .Marland Kitchen... Take 1 tab  daily  Problem # 3:  ESSENTIAL HYPERTENSION, BENIGN (ICD-401.1)  Continue present regimen.  Her updated medication list for this problem includes:    Aspirin 325 Mg Tabs (Aspirin) .Marland Kitchen... Take 1 tab daily    Furosemide 40 Mg Tabs (Furosemide) .Marland Kitchen... Take 1 tab am and 1/2 pm    Metoprolol Succinate 50 Mg Xr24h-tab (Metoprolol succinate) .Marland Kitchen... Take 1 tab daily    Diltiazem Hcl 30 Mg Tabs (Diltiazem hcl) .Marland Kitchen... Take 1 tab three times a day  Problem # 4:  HYPERLIPIDEMIA (ICD-272.4)  LDL trend is better, not yet optimal. Continue present medications. Followup lipid profile for next visit.  Her updated medication list for this problem includes:    Crestor 20 Mg Tabs (Rosuvastatin calcium) .Marland Kitchen... Take one tablet by mouth daily.  Future Orders: T-Lipid Profile [id number]) ... 03/10/2011 T-Hepatic Function [id number]) ... 03/10/2011  Problem # 5:  BRADYCARDIA-TACHYCARDIA SYNDROME (ICD-427.81)  Continue regular device followup with Dr. Ladona Ridgel.  Her updated medication list for this problem includes:    Aspirin 325 Mg Tabs (Aspirin) .Marland Kitchen... Take 1 tab daily    Plavix 75 Mg Tabs (Clopidogrel bisulfate) .Marland Kitchen... Take 1 tab daily    Amiodarone Hcl 200 Mg Tabs (Amiodarone hcl) .Marland Kitchen... Take 1 tablet once daily    Nitrostat 0.4 Mg Subl (Nitroglycerin) .Marland Kitchen... Take as directed for chest pain    Metoprolol Succinate 50 Mg Xr24h-tab (Metoprolol succinate) .Marland Kitchen... Take 1 tab daily    Diltiazem Hcl 30 Mg Tabs (Diltiazem hcl) .Marland Kitchen... Take 1 tab three times a day  Other Orders: Future Orders: T-TSH ([id number]) ... 03/10/2011  Patient Instructions: 1)  Your physician recommends that you schedule a follow-up appointment in: 6 months 2)  Your physician recommends that you return for lab work in: 6 months, just before nex office visit 3)  Your physician recommends that you continue on your current medications as directed. Please refer to the Current Medication list given to you today.

## 2010-10-02 ENCOUNTER — Encounter: Payer: Self-pay | Admitting: Orthopedic Surgery

## 2010-10-02 ENCOUNTER — Ambulatory Visit (INDEPENDENT_AMBULATORY_CARE_PROVIDER_SITE_OTHER): Payer: MEDICARE | Admitting: Orthopedic Surgery

## 2010-10-02 DIAGNOSIS — Z4789 Encounter for other orthopedic aftercare: Secondary | ICD-10-CM

## 2010-10-10 NOTE — Assessment & Plan Note (Signed)
Summary: 4 wk RE-Ck RT SHOULDER/FOL'G PT/POST OP RC SURG 08/09/10/SEC HO...   Visit Type:  Follow-up Referring Provider:  Dr. Catalina Pizza Primary Provider:  Dr. Dwana Melena  CC:  recheck shoulder post op.  History of Present Illness: 75 year old female postop visit  status post open rotator cuff repair RIGHT shoulder  Date of surgery August 09, 2010  POD # 54   Operative findings large U-shaped rotator cuff tear the supraspinatus  Procedure open rotator cuff repair with margin convergence and one absorbable suture anchor.  Meds for pain: Norco 5mg  1 by mouth q 4 hrs as needed pain, takes 2 per day.  Today is 4 week recheck after PT.  Did not go for the therapy due to transportation issues, does HEP.  she is wearing her sling today. She is pain level of 2/10.  Physical examination  She has forward elevation of 120, active. External rotation passive is 45    Allergies: 1)  ! Sulfa 2)  ! Amitriptyline Hcl 3)  ! * Perphenazine 4)  ! Adhesive Tape   Impression & Recommendations:  Problem # 1:  AFTERCARE FOLLOW SURGERY MUSCULOSKEL SYSTEM NEC (ICD-V58.78) Assessment Improved  Orders: Physical Therapy Referral (PT) Post-Op Check ([address])  Problem # 2:  RUPTURE ROTATOR CUFF (ICD-727.61) Assessment: Improved  Orders: Physical Therapy Referral (PT)  Patient Instructions: 1)  Takes sling off 2)  Go for therapy one more time to get exercises program to do at home 3)  Come back in 6 weeks   Orders Added: 1)  Physical Therapy Referral [PT] 2)  Post-Op Check [[address]]

## 2010-10-14 LAB — CBC
HCT: 38.3 % (ref 36.0–46.0)
Hemoglobin: 12.8 g/dL (ref 12.0–15.0)
MCH: 30.3 pg (ref 26.0–34.0)
MCHC: 33.4 g/dL (ref 30.0–36.0)

## 2010-10-14 LAB — BASIC METABOLIC PANEL
BUN: 8 mg/dL (ref 6–23)
CO2: 27 mEq/L (ref 19–32)
Calcium: 9.8 mg/dL (ref 8.4–10.5)
Glucose, Bld: 110 mg/dL — ABNORMAL HIGH (ref 70–99)
Sodium: 141 mEq/L (ref 135–145)

## 2010-10-14 LAB — DIFFERENTIAL
Basophils Relative: 1 % (ref 0–1)
Eosinophils Absolute: 0.1 10*3/uL (ref 0.0–0.7)
Monocytes Absolute: 0.5 10*3/uL (ref 0.1–1.0)
Monocytes Relative: 8 % (ref 3–12)
Neutrophils Relative %: 59 % (ref 43–77)

## 2010-10-14 LAB — APTT: aPTT: 29 seconds (ref 24–37)

## 2010-10-14 LAB — PROTIME-INR: INR: 0.96 (ref 0.00–1.49)

## 2010-10-15 ENCOUNTER — Ambulatory Visit (HOSPITAL_COMMUNITY): Payer: MEDICARE | Admitting: Specialist

## 2010-10-18 LAB — CBC
HCT: 36.2 % (ref 36.0–46.0)
Hemoglobin: 12.1 g/dL (ref 12.0–15.0)
MCV: 86.3 fL (ref 78.0–100.0)
RDW: 16 % — ABNORMAL HIGH (ref 11.5–15.5)
WBC: 5.1 10*3/uL (ref 4.0–10.5)

## 2010-10-18 LAB — POCT CARDIAC MARKERS
CKMB, poc: <1 ng/mL — ABNORMAL LOW (ref 1.0–8.0)
Myoglobin, poc: 64.7 ng/mL (ref 12–200)
Troponin i, poc: <0.05 ng/mL (ref 0.00–0.09)

## 2010-10-18 LAB — DIFFERENTIAL
Basophils Absolute: 0 10*3/uL (ref 0.0–0.1)
Eosinophils Relative: 1 % (ref 0–5)
Lymphocytes Relative: 31 % (ref 12–46)
Monocytes Absolute: 0.5 10*3/uL (ref 0.1–1.0)
Monocytes Relative: 10 % (ref 3–12)
Neutro Abs: 2.9 10*3/uL (ref 1.7–7.7)

## 2010-10-18 LAB — BASIC METABOLIC PANEL
BUN: 13 mg/dL (ref 6–23)
Chloride: 108 mEq/L (ref 96–112)
GFR calc non Af Amer: >60 mL/min (ref >60)
Glucose, Bld: 136 mg/dL — ABNORMAL HIGH (ref 70–99)
Potassium: 3.2 mEq/L — ABNORMAL LOW (ref 3.5–5.1)
Sodium: 141 mEq/L (ref 135–145)

## 2010-10-19 LAB — DIFFERENTIAL
Basophils Absolute: 0 10*3/uL (ref 0.0–0.1)
Basophils Absolute: 0 10*3/uL (ref 0.0–0.1)
Eosinophils Absolute: 0.1 10*3/uL (ref 0.0–0.7)
Eosinophils Relative: 1 % (ref 0–5)
Lymphocytes Relative: 35 % (ref 12–46)
Lymphocytes Relative: 37 % (ref 12–46)
Lymphs Abs: 1.7 10*3/uL (ref 0.7–4.0)
Lymphs Abs: 2.1 10*3/uL (ref 0.7–4.0)
Monocytes Absolute: 0.4 10*3/uL (ref 0.1–1.0)
Monocytes Relative: 9 % (ref 3–12)
Neutro Abs: 2.4 10*3/uL (ref 1.7–7.7)
Neutrophils Relative %: 53 % (ref 43–77)

## 2010-10-19 LAB — COMPREHENSIVE METABOLIC PANEL
Albumin: 3.4 g/dL — ABNORMAL LOW (ref 3.5–5.2)
BUN: 12 mg/dL (ref 6–23)
Calcium: 9.2 mg/dL (ref 8.4–10.5)
Creatinine, Ser: 0.79 mg/dL (ref 0.4–1.2)
Total Bilirubin: 0.9 mg/dL (ref 0.3–1.2)
Total Protein: 7.3 g/dL (ref 6.0–8.3)

## 2010-10-19 LAB — RAPID URINE DRUG SCREEN, HOSP PERFORMED
Amphetamines: NOT DETECTED
Benzodiazepines: POSITIVE — AB
Cocaine: NOT DETECTED
Cocaine: NOT DETECTED
Opiates: NOT DETECTED
Tetrahydrocannabinol: NOT DETECTED
Tetrahydrocannabinol: NOT DETECTED

## 2010-10-19 LAB — BASIC METABOLIC PANEL
BUN: 13 mg/dL (ref 6–23)
Calcium: 9.3 mg/dL (ref 8.4–10.5)
Creatinine, Ser: 0.91 mg/dL (ref 0.4–1.2)
GFR calc non Af Amer: >60 mL/min (ref >60)
Glucose, Bld: 88 mg/dL (ref 70–99)
Potassium: 3.3 mEq/L — ABNORMAL LOW (ref 3.5–5.1)

## 2010-10-19 LAB — URINE MICROSCOPIC-ADD ON

## 2010-10-19 LAB — CBC
HCT: 33 % — ABNORMAL LOW (ref 36.0–46.0)
HCT: 34.8 % — ABNORMAL LOW (ref 36.0–46.0)
MCHC: 32.2 g/dL (ref 30.0–36.0)
MCV: 85.7 fL (ref 78.0–100.0)
Platelets: 173 10*3/uL (ref 150–400)
Platelets: 187 10*3/uL (ref 150–400)
RDW: 16.3 % — ABNORMAL HIGH (ref 11.5–15.5)
RDW: 16.4 % — ABNORMAL HIGH (ref 11.5–15.5)
WBC: 5.9 10*3/uL (ref 4.0–10.5)

## 2010-10-19 LAB — CARDIAC PANEL(CRET KIN+CKTOT+MB+TROPI)
CK, MB: 1.3 ng/mL (ref 0.3–4.0)
Relative Index: INVALID (ref 0.0–2.5)
Total CK: 56 U/L (ref 7–177)
Troponin I: 0.02 ng/mL (ref 0.00–0.06)
Troponin I: 0.03 ng/mL (ref 0.00–0.06)

## 2010-10-19 LAB — PROTIME-INR
INR: 1.06 (ref 0.00–1.49)
Prothrombin Time: 13.7 seconds (ref 11.6–15.2)

## 2010-10-19 LAB — URINALYSIS, ROUTINE W REFLEX MICROSCOPIC
Nitrite: NEGATIVE
Specific Gravity, Urine: <1.005 — ABNORMAL LOW (ref 1.005–1.030)
Urobilinogen, UA: 0.2 mg/dL (ref 0.0–1.0)
pH: 6.5 (ref 5.0–8.0)

## 2010-10-19 LAB — POCT CARDIAC MARKERS: Myoglobin, poc: 77 ng/mL (ref 12–200)

## 2010-10-21 ENCOUNTER — Ambulatory Visit (HOSPITAL_COMMUNITY): Payer: MEDICARE | Admitting: Specialist

## 2010-10-22 LAB — BASIC METABOLIC PANEL WITH GFR
BUN: 12 mg/dL (ref 6–23)
CO2: 29 meq/L (ref 19–32)
Calcium: 9.2 mg/dL (ref 8.4–10.5)
Chloride: 104 meq/L (ref 96–112)
Creatinine, Ser: 0.91 mg/dL (ref 0.4–1.2)
GFR calc non Af Amer: >60 mL/min
Glucose, Bld: 92 mg/dL (ref 70–99)
Potassium: 3.6 meq/L (ref 3.5–5.1)
Sodium: 140 meq/L (ref 135–145)

## 2010-10-22 LAB — DIFFERENTIAL
Basophils Absolute: 0 10*3/uL (ref 0.0–0.1)
Basophils Absolute: 0 10*3/uL (ref 0.0–0.1)
Basophils Relative: 0 % (ref 0–1)
Eosinophils Absolute: 0 10*3/uL (ref 0.0–0.7)
Eosinophils Relative: 1 % (ref 0–5)
Lymphocytes Relative: 37 % (ref 12–46)
Lymphocytes Relative: 39 % (ref 12–46)
Lymphs Abs: 2 10*3/uL (ref 0.7–4.0)
Lymphs Abs: 2.1 10*3/uL (ref 0.7–4.0)
Monocytes Absolute: 0.5 10*3/uL (ref 0.1–1.0)
Monocytes Absolute: 0.5 10*3/uL (ref 0.1–1.0)
Monocytes Relative: 10 % (ref 3–12)
Neutro Abs: 2.9 10*3/uL (ref 1.7–7.7)
Neutro Abs: 2.7 10*3/uL (ref 1.7–7.7)
Neutrophils Relative %: 53 % (ref 43–77)

## 2010-10-22 LAB — URINE CULTURE

## 2010-10-22 LAB — BASIC METABOLIC PANEL
Chloride: 103 mEq/L (ref 96–112)
GFR calc Af Amer: >60 mL/min (ref >60)
Potassium: 3.7 mEq/L (ref 3.5–5.1)

## 2010-10-22 LAB — CBC
HCT: 35.3 % — ABNORMAL LOW (ref 36.0–46.0)
Hemoglobin: 12.4 g/dL (ref 12.0–15.0)
Hemoglobin: 11.2 g/dL — ABNORMAL LOW (ref 12.0–15.0)
Platelets: 184 10*3/uL (ref 150–400)
RBC: 4.29 MIL/uL (ref 3.87–5.11)
RDW: 15.3 % (ref 11.5–15.5)
RDW: 15.7 % — ABNORMAL HIGH (ref 11.5–15.5)
WBC: 5.5 10*3/uL (ref 4.0–10.5)
WBC: 5.4 10*3/uL (ref 4.0–10.5)

## 2010-10-22 LAB — POCT CARDIAC MARKERS
CKMB, poc: 2 ng/mL (ref 1.0–8.0)
CKMB, poc: 1.2 ng/mL (ref 1.0–8.0)
CKMB, poc: <1 ng/mL — ABNORMAL LOW (ref 1.0–8.0)
Myoglobin, poc: 68.8 ng/mL (ref 12–200)
Myoglobin, poc: 66.9 ng/mL (ref 12–200)
Myoglobin, poc: 74.1 ng/mL (ref 12–200)
Troponin i, poc: <0.05 ng/mL (ref 0.00–0.09)
Troponin i, poc: <0.05 ng/mL (ref 0.00–0.09)
Troponin i, poc: <0.05 ng/mL (ref 0.00–0.09)

## 2010-10-22 LAB — URINALYSIS, ROUTINE W REFLEX MICROSCOPIC
Bilirubin Urine: NEGATIVE
Nitrite: NEGATIVE
Specific Gravity, Urine: 1.01 (ref 1.005–1.030)
Urobilinogen, UA: 0.2 mg/dL (ref 0.0–1.0)

## 2010-10-22 LAB — BRAIN NATRIURETIC PEPTIDE: Pro B Natriuretic peptide (BNP): 127 pg/mL — ABNORMAL HIGH (ref 0.0–100.0)

## 2010-10-22 LAB — D-DIMER, QUANTITATIVE

## 2010-10-28 ENCOUNTER — Ambulatory Visit (HOSPITAL_COMMUNITY): Payer: MEDICARE | Admitting: Specialist

## 2010-11-06 LAB — POCT I-STAT, CHEM 8
Creatinine, Ser: 0.8 mg/dL (ref 0.4–1.2)
Glucose, Bld: 121 mg/dL — ABNORMAL HIGH (ref 70–99)
Hemoglobin: 11.9 g/dL — ABNORMAL LOW (ref 12.0–15.0)
Sodium: 136 mEq/L (ref 135–145)
TCO2: 27 mmol/L (ref 0–100)

## 2010-11-06 LAB — DIFFERENTIAL
Basophils Relative: 0 % (ref 0–1)
Basophils Relative: 0 % (ref 0–1)
Eosinophils Absolute: 0.1 10*3/uL (ref 0.0–0.7)
Eosinophils Absolute: 0 10*3/uL (ref 0.0–0.7)
Eosinophils Relative: 0 % (ref 0–5)
Lymphs Abs: 1.7 10*3/uL (ref 0.7–4.0)
Lymphs Abs: 1.1 10*3/uL (ref 0.7–4.0)
Monocytes Absolute: 1 10*3/uL (ref 0.1–1.0)
Monocytes Relative: 8 % (ref 3–12)
Monocytes Relative: 7 % (ref 3–12)
Neutrophils Relative %: 76 % (ref 43–77)

## 2010-11-06 LAB — CK TOTAL AND CKMB (NOT AT ARMC)
CK, MB: 0.6 ng/mL (ref 0.3–4.0)
CK, MB: 0.6 ng/mL (ref 0.3–4.0)
CK, MB: 0.6 ng/mL (ref 0.3–4.0)
CK, MB: 0.3 ng/mL (ref 0.3–4.0)
CK, MB: 0.4 ng/mL (ref 0.3–4.0)
Relative Index: INVALID (ref 0.0–2.5)
Relative Index: INVALID (ref 0.0–2.5)
Total CK: 34 U/L (ref 7–177)
Total CK: 43 U/L (ref 7–177)
Total CK: 23 U/L (ref 7–177)
Total CK: 23 U/L (ref 7–177)

## 2010-11-06 LAB — TYPE AND SCREEN
ABO/RH(D): A POS
Antibody Screen: NEGATIVE
Antibody Screen: NEGATIVE

## 2010-11-06 LAB — FUNGUS CULTURE W SMEAR: Fungal Smear: NONE SEEN

## 2010-11-06 LAB — POCT CARDIAC MARKERS
CKMB, poc: <1 ng/mL — ABNORMAL LOW (ref 1.0–8.0)
CKMB, poc: <1 ng/mL — ABNORMAL LOW (ref 1.0–8.0)
Myoglobin, poc: 64.9 ng/mL (ref 12–200)
Myoglobin, poc: 70.5 ng/mL (ref 12–200)
Troponin i, poc: <0.05 ng/mL (ref 0.00–0.09)
Troponin i, poc: <0.05 ng/mL (ref 0.00–0.09)

## 2010-11-06 LAB — CBC
HCT: 32 % — ABNORMAL LOW (ref 36.0–46.0)
HCT: 27.6 % — ABNORMAL LOW (ref 36.0–46.0)
HCT: 28.8 % — ABNORMAL LOW (ref 36.0–46.0)
HCT: 29.9 % — ABNORMAL LOW (ref 36.0–46.0)
HCT: 30.2 % — ABNORMAL LOW (ref 36.0–46.0)
HCT: 33.7 % — ABNORMAL LOW (ref 36.0–46.0)
Hemoglobin: 10.8 g/dL — ABNORMAL LOW (ref 12.0–15.0)
Hemoglobin: 11 g/dL — ABNORMAL LOW (ref 12.0–15.0)
Hemoglobin: 10.2 g/dL — ABNORMAL LOW (ref 12.0–15.0)
Hemoglobin: 9.4 g/dL — ABNORMAL LOW (ref 12.0–15.0)
Hemoglobin: 9.7 g/dL — ABNORMAL LOW (ref 12.0–15.0)
MCHC: 33.7 g/dL (ref 30.0–36.0)
MCHC: 33.7 g/dL (ref 30.0–36.0)
MCHC: 33.9 g/dL (ref 30.0–36.0)
MCHC: 34 g/dL (ref 30.0–36.0)
MCHC: 34 g/dL (ref 30.0–36.0)
MCHC: 34.1 g/dL (ref 30.0–36.0)
MCV: 88.1 fL (ref 78.0–100.0)
MCV: 88 fL (ref 78.0–100.0)
MCV: 89.8 fL (ref 78.0–100.0)
MCV: 90.2 fL (ref 78.0–100.0)
MCV: 90.2 fL (ref 78.0–100.0)
MCV: 90.3 fL (ref 78.0–100.0)
Platelets: 325 10*3/uL (ref 150–400)
Platelets: 211 10*3/uL (ref 150–400)
Platelets: 222 10*3/uL (ref 150–400)
Platelets: 237 10*3/uL (ref 150–400)
Platelets: 247 10*3/uL (ref 150–400)
Platelets: 286 10*3/uL (ref 150–400)
RBC: 3.63 MIL/uL — ABNORMAL LOW (ref 3.87–5.11)
RBC: 3.71 MIL/uL — ABNORMAL LOW (ref 3.87–5.11)
RBC: 3.75 MIL/uL — ABNORMAL LOW (ref 3.87–5.11)
RBC: 3.06 MIL/uL — ABNORMAL LOW (ref 3.87–5.11)
RBC: 3.19 MIL/uL — ABNORMAL LOW (ref 3.87–5.11)
RBC: 3.35 MIL/uL — ABNORMAL LOW (ref 3.87–5.11)
RBC: 3.65 MIL/uL — ABNORMAL LOW (ref 3.87–5.11)
RDW: 13.5 % (ref 11.5–15.5)
RDW: 13.6 % (ref 11.5–15.5)
RDW: 13.6 % (ref 11.5–15.5)
RDW: 13.7 % (ref 11.5–15.5)
RDW: 13.7 % (ref 11.5–15.5)
WBC: 11.6 10*3/uL — ABNORMAL HIGH (ref 4.0–10.5)
WBC: 10.7 10*3/uL — ABNORMAL HIGH (ref 4.0–10.5)
WBC: 8 10*3/uL (ref 4.0–10.5)
WBC: 12.1 10*3/uL — ABNORMAL HIGH (ref 4.0–10.5)
WBC: 16.2 10*3/uL — ABNORMAL HIGH (ref 4.0–10.5)
WBC: 8 10*3/uL (ref 4.0–10.5)
WBC: 9.9 10*3/uL (ref 4.0–10.5)

## 2010-11-06 LAB — BLOOD GAS, ARTERIAL
Acid-Base Excess: 1.9 mmol/L (ref 0.0–2.0)
Bicarbonate: 26.4 mEq/L — ABNORMAL HIGH (ref 20.0–24.0)
Drawn by: 287601
FIO2: 0.28 %
O2 Content: 2 L/min
O2 Saturation: 91.9 %
Patient temperature: 98.6
TCO2: 27.7 mmol/L (ref 0–100)
pCO2 arterial: 44 mmHg (ref 35.0–45.0)
pH, Arterial: 7.395 (ref 7.350–7.400)
pO2, Arterial: 64.7 mmHg — ABNORMAL LOW (ref 80.0–100.0)

## 2010-11-06 LAB — BASIC METABOLIC PANEL
BUN: 9 mg/dL (ref 6–23)
BUN: 10 mg/dL (ref 6–23)
BUN: 7 mg/dL (ref 6–23)
CO2: 27 mEq/L (ref 19–32)
CO2: 28 mEq/L (ref 19–32)
CO2: 28 mEq/L (ref 19–32)
CO2: 30 mEq/L (ref 19–32)
CO2: 27 mEq/L (ref 19–32)
CO2: 30 mEq/L (ref 19–32)
Calcium: 8.8 mg/dL (ref 8.4–10.5)
Calcium: 8.8 mg/dL (ref 8.4–10.5)
Calcium: 9.1 mg/dL (ref 8.4–10.5)
Calcium: 8.6 mg/dL (ref 8.4–10.5)
Calcium: 8.7 mg/dL (ref 8.4–10.5)
Chloride: 100 mEq/L (ref 96–112)
Chloride: 102 mEq/L (ref 96–112)
Chloride: 102 mEq/L (ref 96–112)
Chloride: 99 mEq/L (ref 96–112)
Creatinine, Ser: 0.63 mg/dL (ref 0.4–1.2)
Creatinine, Ser: 0.69 mg/dL (ref 0.4–1.2)
Creatinine, Ser: 0.58 mg/dL (ref 0.4–1.2)
Creatinine, Ser: 0.63 mg/dL (ref 0.4–1.2)
GFR calc Af Amer: >60 mL/min (ref >60)
GFR calc Af Amer: >60 mL/min (ref >60)
GFR calc Af Amer: >60 mL/min (ref >60)
GFR calc Af Amer: >60 mL/min (ref >60)
GFR calc Af Amer: >60 mL/min (ref >60)
GFR calc non Af Amer: >60 mL/min (ref >60)
GFR calc non Af Amer: >60 mL/min (ref >60)
GFR calc non Af Amer: >60 mL/min (ref >60)
GFR calc non Af Amer: >60 mL/min (ref >60)
Glucose, Bld: 99 mg/dL (ref 70–99)
Glucose, Bld: 104 mg/dL — ABNORMAL HIGH (ref 70–99)
Glucose, Bld: 134 mg/dL — ABNORMAL HIGH (ref 70–99)
Glucose, Bld: 99 mg/dL (ref 70–99)
Potassium: 3.5 mEq/L (ref 3.5–5.1)
Potassium: 4 mEq/L (ref 3.5–5.1)
Potassium: 3.8 mEq/L (ref 3.5–5.1)
Potassium: 4 mEq/L (ref 3.5–5.1)
Sodium: 135 mEq/L (ref 135–145)
Sodium: 138 mEq/L (ref 135–145)
Sodium: 139 mEq/L (ref 135–145)
Sodium: 140 mEq/L (ref 135–145)
Sodium: 135 mEq/L (ref 135–145)
Sodium: 135 mEq/L (ref 135–145)

## 2010-11-06 LAB — COMPREHENSIVE METABOLIC PANEL
ALT: 16 U/L (ref 0–35)
AST: 20 U/L (ref 0–37)
Albumin: 2.2 g/dL — ABNORMAL LOW (ref 3.5–5.2)
Albumin: 2.3 g/dL — ABNORMAL LOW (ref 3.5–5.2)
Albumin: 2.8 g/dL — ABNORMAL LOW (ref 3.5–5.2)
Alkaline Phosphatase: 68 U/L (ref 39–117)
Alkaline Phosphatase: 71 U/L (ref 39–117)
Alkaline Phosphatase: 71 U/L (ref 39–117)
BUN: 8 mg/dL (ref 6–23)
BUN: 8 mg/dL (ref 6–23)
BUN: 9 mg/dL (ref 6–23)
CO2: 29 mEq/L (ref 19–32)
CO2: 29 mEq/L (ref 19–32)
Calcium: 8.7 mg/dL (ref 8.4–10.5)
Calcium: 9.1 mg/dL (ref 8.4–10.5)
Chloride: 97 mEq/L (ref 96–112)
Chloride: 98 mEq/L (ref 96–112)
Creatinine, Ser: 0.63 mg/dL (ref 0.4–1.2)
GFR calc Af Amer: >60 mL/min (ref >60)
GFR calc non Af Amer: >60 mL/min (ref >60)
GFR calc non Af Amer: >60 mL/min (ref >60)
Glucose, Bld: 118 mg/dL — ABNORMAL HIGH (ref 70–99)
Glucose, Bld: 119 mg/dL — ABNORMAL HIGH (ref 70–99)
Potassium: 3.4 mEq/L — ABNORMAL LOW (ref 3.5–5.1)
Potassium: 3.9 mEq/L (ref 3.5–5.1)
Potassium: 4 mEq/L (ref 3.5–5.1)
Sodium: 133 mEq/L — ABNORMAL LOW (ref 135–145)
Total Bilirubin: 0.7 mg/dL (ref 0.3–1.2)
Total Bilirubin: 1 mg/dL (ref 0.3–1.2)
Total Protein: 6.3 g/dL (ref 6.0–8.3)
Total Protein: 6.3 g/dL (ref 6.0–8.3)

## 2010-11-06 LAB — URINE MICROSCOPIC-ADD ON

## 2010-11-06 LAB — MRSA PCR SCREENING: MRSA by PCR: NEGATIVE

## 2010-11-06 LAB — TISSUE CULTURE

## 2010-11-06 LAB — HEPATIC FUNCTION PANEL
ALT: 12 U/L (ref 0–35)
AST: 15 U/L (ref 0–37)
Bilirubin, Direct: 0.1 mg/dL (ref 0.0–0.3)
Total Bilirubin: 0.4 mg/dL (ref 0.3–1.2)

## 2010-11-06 LAB — URINALYSIS, ROUTINE W REFLEX MICROSCOPIC
Glucose, UA: NEGATIVE mg/dL
Ketones, ur: 15 mg/dL — AB
Protein, ur: 30 mg/dL — AB

## 2010-11-06 LAB — TROPONIN I
Troponin I: 0.01 ng/mL (ref 0.00–0.06)
Troponin I: 0.02 ng/mL (ref 0.00–0.06)
Troponin I: 0.01 ng/mL (ref 0.00–0.06)
Troponin I: 0.01 ng/mL (ref 0.00–0.06)

## 2010-11-06 LAB — D-DIMER, QUANTITATIVE: D-Dimer, Quant: 2.94 ug/mL-FEU — ABNORMAL HIGH (ref 0.00–0.48)

## 2010-11-06 LAB — AFB CULTURE WITH SMEAR (NOT AT ARMC)

## 2010-11-06 LAB — SEDIMENTATION RATE: Sed Rate: 80 mm/hr — ABNORMAL HIGH (ref 0–22)

## 2010-11-06 LAB — CULTURE, BLOOD (ROUTINE X 2)

## 2010-11-06 LAB — BODY FLUID CULTURE: Culture: NO GROWTH

## 2010-11-06 LAB — PROTIME-INR: INR: 1.19 (ref 0.00–1.49)

## 2010-11-06 LAB — POCT I-STAT 3, ART BLOOD GAS (G3+)
Bicarbonate: 25.9 mEq/L — ABNORMAL HIGH (ref 20.0–24.0)
Patient temperature: 99.77
pH, Arterial: 7.441 — ABNORMAL HIGH (ref 7.350–7.400)

## 2010-11-06 LAB — BRAIN NATRIURETIC PEPTIDE: Pro B Natriuretic peptide (BNP): 580 pg/mL — ABNORMAL HIGH (ref 0.0–100.0)

## 2010-11-06 LAB — TSH: TSH: 2.018 u[IU]/mL (ref 0.350–4.500)

## 2010-11-06 LAB — ANTI-NEUTROPHIL ANTIBODY

## 2010-11-06 LAB — ANA: Anti Nuclear Antibody(ANA): NEGATIVE

## 2010-11-07 LAB — CBC
HCT: 34.3 % — ABNORMAL LOW (ref 36.0–46.0)
HCT: 33.5 % — ABNORMAL LOW (ref 36.0–46.0)
HCT: 33.6 % — ABNORMAL LOW (ref 36.0–46.0)
HCT: 38.1 % (ref 36.0–46.0)
HCT: 34.7 % — ABNORMAL LOW (ref 36.0–46.0)
HCT: 35.3 % — ABNORMAL LOW (ref 36.0–46.0)
Hemoglobin: 11.7 g/dL — ABNORMAL LOW (ref 12.0–15.0)
Hemoglobin: 11.4 g/dL — ABNORMAL LOW (ref 12.0–15.0)
Hemoglobin: 11.4 g/dL — ABNORMAL LOW (ref 12.0–15.0)
Hemoglobin: 11.7 g/dL — ABNORMAL LOW (ref 12.0–15.0)
Hemoglobin: 12 g/dL (ref 12.0–15.0)
Hemoglobin: 12.8 g/dL (ref 12.0–15.0)
Hemoglobin: 11.9 g/dL — ABNORMAL LOW (ref 12.0–15.0)
Hemoglobin: 12.3 g/dL (ref 12.0–15.0)
MCHC: 33.6 g/dL (ref 30.0–36.0)
MCHC: 34.2 g/dL (ref 30.0–36.0)
MCHC: 33.4 g/dL (ref 30.0–36.0)
MCV: 89.4 fL (ref 78.0–100.0)
MCV: 90.7 fL (ref 78.0–100.0)
Platelets: 212 10*3/uL (ref 150–400)
Platelets: 181 10*3/uL (ref 150–400)
Platelets: 190 10*3/uL (ref 150–400)
Platelets: 239 10*3/uL (ref 150–400)
Platelets: 163 10*3/uL (ref 150–400)
Platelets: 169 10*3/uL (ref 150–400)
RBC: 3.84 MIL/uL — ABNORMAL LOW (ref 3.87–5.11)
RBC: 3.57 MIL/uL — ABNORMAL LOW (ref 3.87–5.11)
RBC: 3.69 MIL/uL — ABNORMAL LOW (ref 3.87–5.11)
RBC: 3.84 MIL/uL — ABNORMAL LOW (ref 3.87–5.11)
RBC: 3.9 MIL/uL (ref 3.87–5.11)
RDW: 13.7 % (ref 11.5–15.5)
RDW: 14.2 % (ref 11.5–15.5)
RDW: 13.4 % (ref 11.5–15.5)
RDW: 13.6 % (ref 11.5–15.5)
RDW: 13.7 % (ref 11.5–15.5)
WBC: 10.3 10*3/uL (ref 4.0–10.5)
WBC: 10.6 10*3/uL — ABNORMAL HIGH (ref 4.0–10.5)
WBC: 11 10*3/uL — ABNORMAL HIGH (ref 4.0–10.5)
WBC: 6 10*3/uL (ref 4.0–10.5)
WBC: 6 10*3/uL (ref 4.0–10.5)
WBC: 6.8 10*3/uL (ref 4.0–10.5)
WBC: 5 10*3/uL (ref 4.0–10.5)

## 2010-11-07 LAB — BASIC METABOLIC PANEL
BUN: 10 mg/dL (ref 6–23)
BUN: 7 mg/dL (ref 6–23)
CO2: 28 mEq/L (ref 19–32)
CO2: 28 mEq/L (ref 19–32)
Calcium: 8.9 mg/dL (ref 8.4–10.5)
Calcium: 9 mg/dL (ref 8.4–10.5)
Calcium: 9.6 mg/dL (ref 8.4–10.5)
Calcium: 9.8 mg/dL (ref 8.4–10.5)
Calcium: 9.1 mg/dL (ref 8.4–10.5)
Creatinine, Ser: 0.71 mg/dL (ref 0.4–1.2)
Creatinine, Ser: 0.76 mg/dL (ref 0.4–1.2)
Creatinine, Ser: 0.59 mg/dL (ref 0.4–1.2)
GFR calc Af Amer: >60 mL/min (ref >60)
GFR calc Af Amer: >60 mL/min (ref >60)
GFR calc Af Amer: >60 mL/min (ref >60)
GFR calc non Af Amer: >60 mL/min (ref >60)
GFR calc non Af Amer: >60 mL/min (ref >60)
GFR calc non Af Amer: >60 mL/min (ref >60)
GFR calc non Af Amer: >60 mL/min (ref >60)
Glucose, Bld: 128 mg/dL — ABNORMAL HIGH (ref 70–99)
Glucose, Bld: 89 mg/dL (ref 70–99)
Potassium: 4 mEq/L (ref 3.5–5.1)
Potassium: 3.3 mEq/L — ABNORMAL LOW (ref 3.5–5.1)
Sodium: 136 mEq/L (ref 135–145)
Sodium: 141 mEq/L (ref 135–145)
Sodium: 139 mEq/L (ref 135–145)
Sodium: 141 mEq/L (ref 135–145)

## 2010-11-07 LAB — DIFFERENTIAL
Basophils Absolute: 0 10*3/uL (ref 0.0–0.1)
Basophils Absolute: 0 10*3/uL (ref 0.0–0.1)
Basophils Relative: 0 % (ref 0–1)
Eosinophils Relative: 1 % (ref 0–5)
Lymphocytes Relative: 13 % (ref 12–46)
Lymphocytes Relative: 36 % (ref 12–46)
Lymphs Abs: 2.5 10*3/uL (ref 0.7–4.0)
Monocytes Absolute: 0.7 10*3/uL (ref 0.1–1.0)
Monocytes Absolute: 0.5 10*3/uL (ref 0.1–1.0)
Neutro Abs: 8.1 10*3/uL — ABNORMAL HIGH (ref 1.7–7.7)
Neutro Abs: 3.7 10*3/uL (ref 1.7–7.7)

## 2010-11-07 LAB — COMPREHENSIVE METABOLIC PANEL
ALT: 26 U/L (ref 0–35)
ALT: 14 U/L (ref 0–35)
AST: 19 U/L (ref 0–37)
Albumin: 3.1 g/dL — ABNORMAL LOW (ref 3.5–5.2)
Alkaline Phosphatase: 82 U/L (ref 39–117)
Alkaline Phosphatase: 76 U/L (ref 39–117)
BUN: 11 mg/dL (ref 6–23)
BUN: 8 mg/dL (ref 6–23)
CO2: 30 mEq/L (ref 19–32)
CO2: 26 mEq/L (ref 19–32)
Calcium: 8.7 mg/dL (ref 8.4–10.5)
Chloride: 103 mEq/L (ref 96–112)
Chloride: 99 mEq/L (ref 96–112)
Chloride: 108 mEq/L (ref 96–112)
Creatinine, Ser: 0.83 mg/dL (ref 0.4–1.2)
GFR calc non Af Amer: >60 mL/min (ref >60)
GFR calc non Af Amer: >60 mL/min (ref >60)
Glucose, Bld: 112 mg/dL — ABNORMAL HIGH (ref 70–99)
Glucose, Bld: 93 mg/dL (ref 70–99)
Potassium: 4.1 mEq/L (ref 3.5–5.1)
Potassium: 3.9 mEq/L (ref 3.5–5.1)
Sodium: 135 mEq/L (ref 135–145)
Sodium: 141 mEq/L (ref 135–145)
Total Bilirubin: 0.6 mg/dL (ref 0.3–1.2)
Total Bilirubin: 0.5 mg/dL (ref 0.3–1.2)
Total Bilirubin: 0.4 mg/dL (ref 0.3–1.2)
Total Protein: 6.6 g/dL (ref 6.0–8.3)

## 2010-11-07 LAB — POCT CARDIAC MARKERS
CKMB, poc: <1 ng/mL — ABNORMAL LOW (ref 1.0–8.0)
CKMB, poc: 1.3 ng/mL (ref 1.0–8.0)
CKMB, poc: <1 ng/mL — ABNORMAL LOW (ref 1.0–8.0)
Troponin i, poc: <0.05 ng/mL (ref 0.00–0.09)
Troponin i, poc: <0.05 ng/mL (ref 0.00–0.09)
Troponin i, poc: <0.05 ng/mL (ref 0.00–0.09)

## 2010-11-07 LAB — CARDIAC PANEL(CRET KIN+CKTOT+MB+TROPI)
CK, MB: 0.5 ng/mL (ref 0.3–4.0)
CK, MB: 3 ng/mL (ref 0.3–4.0)
CK, MB: 1.2 ng/mL (ref 0.3–4.0)
CK, MB: 1.3 ng/mL (ref 0.3–4.0)
Relative Index: INVALID (ref 0.0–2.5)
Relative Index: INVALID (ref 0.0–2.5)
Relative Index: INVALID (ref 0.0–2.5)
Total CK: 30 U/L (ref 7–177)
Total CK: 49 U/L (ref 7–177)
Total CK: 56 U/L (ref 7–177)
Total CK: 68 U/L (ref 7–177)
Troponin I: 0.01 ng/mL (ref 0.00–0.06)
Troponin I: 0.01 ng/mL (ref 0.00–0.06)
Troponin I: 0.03 ng/mL (ref 0.00–0.06)

## 2010-11-07 LAB — GLUCOSE, CAPILLARY: Glucose-Capillary: 163 mg/dL — ABNORMAL HIGH (ref 70–99)

## 2010-11-07 LAB — HEPARIN LEVEL (UNFRACTIONATED)
Heparin Unfractionated: 0.2 IU/mL — ABNORMAL LOW (ref 0.30–0.70)
Heparin Unfractionated: 0.26 IU/mL — ABNORMAL LOW (ref 0.30–0.70)
Heparin Unfractionated: 0.35 IU/mL (ref 0.30–0.70)
Heparin Unfractionated: <0.1 IU/mL — ABNORMAL LOW (ref 0.30–0.70)
Heparin Unfractionated: <0.1 IU/mL — ABNORMAL LOW (ref 0.30–0.70)
Heparin Unfractionated: 0.6 IU/mL (ref 0.30–0.70)

## 2010-11-07 LAB — LIPID PANEL
Total CHOL/HDL Ratio: 5 RATIO
VLDL: 18 mg/dL (ref 0–40)

## 2010-11-07 LAB — PROTIME-INR
INR: 1.16 (ref 0.00–1.49)
Prothrombin Time: 14.7 seconds (ref 11.6–15.2)

## 2010-11-11 LAB — DIFFERENTIAL
Basophils Absolute: 0 10*3/uL (ref 0.0–0.1)
Eosinophils Absolute: 0.1 10*3/uL (ref 0.0–0.7)
Lymphocytes Relative: 35 % (ref 12–46)
Lymphs Abs: 2.3 10*3/uL (ref 0.7–4.0)
Lymphs Abs: 1.8 10*3/uL (ref 0.7–4.0)
Neutro Abs: 3.6 10*3/uL (ref 1.7–7.7)
Neutrophils Relative %: 58 % (ref 43–77)

## 2010-11-11 LAB — CBC
Hemoglobin: 12.8 g/dL (ref 12.0–15.0)
MCV: 88.5 fL (ref 78.0–100.0)
Platelets: 171 10*3/uL (ref 150–400)
Platelets: 193 10*3/uL (ref 150–400)
RDW: 13.8 % (ref 11.5–15.5)
WBC: 6.6 10*3/uL (ref 4.0–10.5)
WBC: 5.9 10*3/uL (ref 4.0–10.5)

## 2010-11-11 LAB — URINE MICROSCOPIC-ADD ON

## 2010-11-11 LAB — POCT CARDIAC MARKERS
CKMB, poc: <1 ng/mL — ABNORMAL LOW (ref 1.0–8.0)
Myoglobin, poc: 75 ng/mL (ref 12–200)
Troponin i, poc: <0.05 ng/mL (ref 0.00–0.09)

## 2010-11-11 LAB — URINALYSIS, ROUTINE W REFLEX MICROSCOPIC
Bilirubin Urine: NEGATIVE
Glucose, UA: NEGATIVE mg/dL
Hgb urine dipstick: NEGATIVE
Specific Gravity, Urine: <1.005 — ABNORMAL LOW (ref 1.005–1.030)
pH: 6 (ref 5.0–8.0)

## 2010-11-11 LAB — GLUCOSE, CAPILLARY: Glucose-Capillary: 105 mg/dL — ABNORMAL HIGH (ref 70–99)

## 2010-11-11 LAB — BASIC METABOLIC PANEL
BUN: 14 mg/dL (ref 6–23)
Calcium: 9.6 mg/dL (ref 8.4–10.5)
Chloride: 105 mEq/L (ref 96–112)
Creatinine, Ser: 0.69 mg/dL (ref 0.4–1.2)
GFR calc non Af Amer: >60 mL/min (ref >60)
GFR calc non Af Amer: >60 mL/min (ref >60)
Glucose, Bld: 93 mg/dL (ref 70–99)
Sodium: 142 mEq/L (ref 135–145)

## 2010-11-11 LAB — MAGNESIUM: Magnesium: 2.3 mg/dL (ref 1.5–2.5)

## 2010-11-11 LAB — PROTIME-INR: Prothrombin Time: 13.6 seconds (ref 11.6–15.2)

## 2010-11-12 LAB — BASIC METABOLIC PANEL
CO2: 26 mEq/L (ref 19–32)
Glucose, Bld: 139 mg/dL — ABNORMAL HIGH (ref 70–99)
Potassium: 3.4 mEq/L — ABNORMAL LOW (ref 3.5–5.1)
Sodium: 136 mEq/L (ref 135–145)

## 2010-11-12 LAB — CBC
HCT: 34.4 % — ABNORMAL LOW (ref 36.0–46.0)
Hemoglobin: 11.9 g/dL — ABNORMAL LOW (ref 12.0–15.0)
MCHC: 34.5 g/dL (ref 30.0–36.0)
MCV: 89.7 fL (ref 78.0–100.0)
RDW: 13.5 % (ref 11.5–15.5)

## 2010-11-12 LAB — POCT CARDIAC MARKERS
Myoglobin, poc: 72.9 ng/mL (ref 12–200)
Troponin i, poc: <0.05 ng/mL (ref 0.00–0.09)

## 2010-11-12 LAB — URINALYSIS, ROUTINE W REFLEX MICROSCOPIC
Ketones, ur: NEGATIVE mg/dL
Nitrite: NEGATIVE
Protein, ur: NEGATIVE mg/dL
Urobilinogen, UA: 0.2 mg/dL (ref 0.0–1.0)

## 2010-11-12 LAB — DIFFERENTIAL
Basophils Absolute: 0 10*3/uL (ref 0.0–0.1)
Basophils Relative: 1 % (ref 0–1)
Eosinophils Relative: 2 % (ref 0–5)
Monocytes Absolute: 0.4 10*3/uL (ref 0.1–1.0)

## 2010-11-13 ENCOUNTER — Ambulatory Visit (INDEPENDENT_AMBULATORY_CARE_PROVIDER_SITE_OTHER): Payer: MEDICARE | Admitting: Orthopedic Surgery

## 2010-11-13 DIAGNOSIS — M75120 Complete rotator cuff tear or rupture of unspecified shoulder, not specified as traumatic: Secondary | ICD-10-CM

## 2010-11-13 LAB — BASIC METABOLIC PANEL
BUN: 7 mg/dL (ref 6–23)
Calcium: 9.6 mg/dL (ref 8.4–10.5)
GFR calc Af Amer: >60 mL/min (ref >60)
GFR calc non Af Amer: >60 mL/min (ref >60)
GFR calc non Af Amer: >60 mL/min (ref >60)
Glucose, Bld: 145 mg/dL — ABNORMAL HIGH (ref 70–99)
Glucose, Bld: 98 mg/dL (ref 70–99)
Potassium: 4.6 mEq/L (ref 3.5–5.1)
Sodium: 143 mEq/L (ref 135–145)

## 2010-11-13 LAB — DIFFERENTIAL
Basophils Absolute: 0 10*3/uL (ref 0.0–0.1)
Basophils Relative: 0 % (ref 0–1)
Eosinophils Relative: 1 % (ref 0–5)
Eosinophils Relative: 2 % (ref 0–5)
Lymphocytes Relative: 25 % (ref 12–46)
Lymphocytes Relative: 43 % (ref 12–46)
Lymphs Abs: 2 10*3/uL (ref 0.7–4.0)
Monocytes Absolute: 0.5 10*3/uL (ref 0.1–1.0)
Monocytes Relative: 11 % (ref 3–12)
Neutro Abs: 5 10*3/uL (ref 1.7–7.7)

## 2010-11-13 LAB — POCT CARDIAC MARKERS: CKMB, poc: 1.5 ng/mL (ref 1.0–8.0)

## 2010-11-13 LAB — CBC
HCT: 35.9 % — ABNORMAL LOW (ref 36.0–46.0)
Hemoglobin: 12.3 g/dL (ref 12.0–15.0)
Platelets: 193 10*3/uL (ref 150–400)
RBC: 3.98 MIL/uL (ref 3.87–5.11)
RDW: 13.5 % (ref 11.5–15.5)
RDW: 13.6 % (ref 11.5–15.5)
WBC: 4.6 10*3/uL (ref 4.0–10.5)

## 2010-11-13 LAB — CARDIAC PANEL(CRET KIN+CKTOT+MB+TROPI)
CK, MB: 2.1 ng/mL (ref 0.3–4.0)
Total CK: 83 U/L (ref 7–177)
Total CK: 98 U/L (ref 7–177)

## 2010-11-13 LAB — MAGNESIUM: Magnesium: 2.2 mg/dL (ref 1.5–2.5)

## 2010-11-13 NOTE — Progress Notes (Signed)
3 month postop visit. RIGHT shoulder rotator cuff repair.  Date of surgery January 6.  Patient reports some mild shoulder and neck pain.  Exam shows forward elevation 130. Mild weakness in supraspinatus, 5 minus over 5. Tenderness over the trapezius muscle and cervical spine. Mild scapulothoracic dysrhythmia.  Recommend max, freeze to the shoulder 3 times a day, concentrating on the trapezius muscle. Followup 3 months

## 2010-11-13 NOTE — Patient Instructions (Signed)
Apply MAX FREEZE to neck and shoulder 3 times a day

## 2010-12-04 ENCOUNTER — Other Ambulatory Visit: Payer: Self-pay | Admitting: Cardiology

## 2010-12-17 NOTE — Consult Note (Signed)
[name], [name]                  ACCOUNT NO.:  [id number]   MEDICAL RECORD NO.:  [id number]          PATIENT TYPE:  INP   LOCATION:  A317                          FACILITY:  [facility]   PHYSICIAN:  Jonelle Sidle, MD DATE OF BIRTH:  10/27/1935   DATE OF CONSULTATION:  11/28/2008  DATE OF DISCHARGE:                                 CONSULTATION   REQUESTING PHYSICIAN:  Catalina Pizza, MD.   REASON FOR CONSULTATION:  Symptomatic supraventricular tachycardia.   HISTORY OF PRESENT ILLNESS:  Ms. Govan is a 75 year old woman with  previously diagnosed moderate nonobstructive coronary artery disease by  cardiac catheterization in July 2007, with subsequent low-risk Myoview  done in follow-up back in July of last year.  She has had normal left  ventricular systolic function in the range of 60% to 65% and no major  valvular abnormalities beyond trivial aortic regurgitation and mild  mitral regurgitation.  She has been managed medically with a history of  recurrent chest pain and has had no prior documented dysrhythmias.  She  was seen in the office most recently in March with complaints of  continued palpitations that were originally described back in February.  She has been using atenolol, with extra dosing as needed for her  palpitations, and has generally reported increasing frequency of more  prolonged episodes of a sense of rapid heart rate, shortness of breath  and dizziness.  These episodes are sporadic and not clearly exertional.  When last seen in the office arrangements were made for the patient to  wear an event recorder, which she has done.  I am not aware that any  clearly diagnosed arrhythmias have been detected as yet, although a full  report of this monitor is being pulled at this time for review.  Ms.  Vanscyoc presented to the emergency department earlier today complaining  of shortness of breath and rapid palpitations that began this morning.  These symptoms lasted greater than  an hour and she was noted to have  rapid heart rates up into the 170s documented on evaluation by  telemetry.  The patient was described as having atrial fibrillation and,  in fact, some of the strips do suggest that, however there is some  regularity to portions of the dysrhythmia and the possibility of an  atypical atrial flutter or ectopic atrial tachycardia is also a very  real consideration.  The patient was treated with intravenous Cardizem  and spontaneously converted to sinus rhythm where she remains now.  She  was admitted to the hospital for further observation.  On my interview,  the patient stateed that she was feeling better.  She is complaining of  some leg cramps and was noted to be mildly hypokalemia with a potassium  of 3.3.  This is being repleted by Dr. Margo Aye.  Of note, initial cardiac  markers are entirely normal including troponin I, CK-MB and BNP.  Her  chest x-ray shows evidence of chronic obstructive pulmonary disease but  no acute abnormalities.   ALLERGIES:  1. SULFA DRUGS.  2. AMITRIPTYLINE.   MEDICATIONS:  As of her last office visit, included:  1. B12 supplements.  2. Aspirin 81 mg p.o. daily.  3. Oyster Shell Calcium.  4. Lasix 20 mg p.o. b.i.d.  5. Imdur 30 mg p.o. daily.  6. Vitamin E.  7. Nexium 40 mg p.o. b.i.d.  8. Atenolol 50 mg p.o. daily with p.r.n. doses for palpitations.  9. Diovan 160 mg p.o. daily.  10.Folic acid daily.  11.Multivitamin daily.  12.Crestor 20 mg p.o. daily.  13.Xanax p.r.n.   PAST MEDICAL HISTORY:  Coronary artery disease diagnosed at cardiac  catheterization in July 2007.  At that time the patient was noted to  have a 20% left main stenosis, 30% left anterior descending stenosis  followed by 40% stenosis, 40% second diagonal stenosis, 30% proximal  circumflex stenosis, 20% mid circumflex stenosis, 60% mid right coronary  artery stenosis, and 30% to 40% more distal right coronary artery  stenosis.  Ejection fraction has  been consistently normal.  She has not  undergone any previous intervention.   Additional problems include:  1. Chronic obstructive pulmonary disease.  2. Hyperlipidemia.  3. Anxiety.  4. Gastroesophageal reflux disease with previous dysphagia and remote      esophageal dilatation in 2000.  5. Irritable bowel syndrome.  6. B12 deficiency.  7. Resting tremors with questionable diagnosis of Parkinson's disease.   SOCIAL HISTORY:  The patient is retired.  There is no active tobacco or  alcohol use history.  She has family that lives locally.   FAMILY HISTORY:  Significant for colon cancer in the patient's mother;  also, cardiovascular disease in siblings.  She does have 1 brother who  died with a brain tumor.  No obvious cardiac dysrhythmia in the family.   REVIEW OF SYSTEMS:  As outlined above.  No fevers or chills, no cough,  no typical reproducible exertional chest pain, no bleeding problems.  She has not had any frank syncope but has had dizziness with her  palpitations.  She has occasional lower extremity edema and also leg  cramps.  She reports being anxious, particularly with her palpitation  episodes.  Otherwise, systems reviewed and negative.   EXAMINATION:  Temperature is 97.6 degrees, heart rate is 65 and regular,  in normal sinus rhythm by telemetry, respirations 20, blood pressure  123/72, oxygen saturation is 99% on room air.  The patient is  comfortable and in no acute distress.  HEENT:  Conjunctivae is normal.  Oropharynx is clear.  NECK:  Supple.  No elevated jugular venous pressure, no loud bruits, no  thyromegaly is noted.  LUNGS:  Clear with diminished breath sounds, no labored breathing or  wheezing.  CARDIAC:  A regular rate and rhythm.  No pathologic systolic murmur or  pericardial rub.  No S3 gallop.  ABDOMEN:  Soft, obese, nontender.  Liver edge nonpalpable.  Bowel sounds  are present.  EXTREMITIES:  Exhibit chronic-appearing edema, nonpitting.  Distal   pulses are 1+.  SKIN:  Warm and dry.  MUSCULOSKELETAL:  No kyphosis noted.  NEUROPSYCHIATRIC:  The patient is alert and oriented x3 with a resting  tremor.   LABORATORY DATA:  WBCs 7.5, hemoglobin 13.8, hematocrit 40.5, platelets  193.  Sodium 142, potassium 3.3, chloride 109, bicarbonate 26, glucose  145, BUN 7, creatinine 0.75, CK-MB 1.5, troponin-I less than 0.05, BNP  51.   A 12-lead electrocardiogram from presentation shows initially normal  sinus rhythm with subsequent development of a supraventricular  tachycardia which may well be an ectopic atrial tachycardia  vs. atypical  atrial flutter.  Cannot entirely exclude coarse atrial fibrillation.  There are nonspecific ST-T wave changes with this tracing.  Subsequent  tracing shows normal sinus rhythm with normal intervals.   IMPRESSION:  1. Symptomatic paroxysmal supraventricular tachycardia.  Ms. Wisenbaker      has been experiencing progressive episodes of palpitations      associated with shortness of breath, dizziness and anxiety over the      last few months.  She has had no frank syncope with these episodes.      She has been wearing an event recorder and the tracings from this      device are being pulled at this time for further review.  Her most      recent electrocardiogram and telemetry monitoring suggests the      possibility of an atypical atrial flutter versus ectopic atrial      tachycardia, although some strips are actually more indicative of      coarse atrial fibrillation.  In either event, she converted      spontaneously following intravenous Cardizem infusion and remains      in normal sinus rhythm now.  Her CHADS2 score is essentially 1 and      she has been on aspirin as an outpatient already.  Previous      echocardiography demonstrates normal left ventricular systolic      function with no major valvular abnormalities.  Her most recent      study was in March 2008.  2. History of moderate nonobstructive  coronary artery disease by      cardiac catheterization in 2007 with overall low-risk follow-up      Myoview last year in July.  Ms. Mceuen has had episodic      intermittent chest pain for years despite no findings of      definitively obstructive cardiovascular disease.  She has been      managed medically and has not required any percutaneous      interventions.  3. Hyperlipidemia, on statin therapy.  This has generally been well      controlled.  4. History of hypertension.  5. Longstanding history of dyspnea on exertion.  She does have chronic      obstructive pulmonary disease which likely contributes to some      degree.   RECOMMENDATIONS:  Would continue telemetry monitoring and cycle a full  set of cardiac markers.  Will transition from Cardizem drip to Cardizem  30 mg p.o. q.6 h.  Would tend to favor this over continuing to escalate  the dose of atenolol,  particularly with her underlying chronic  obstructive pulmonary disease and the fact that she converted  spontaneously with Cardizem.  It may be that she could be placed on  single drug therapy with Cardizem and discontinue atenolol together.  A  follow-up electrocardiogram as well as an echocardiogram will be  obtained tomorrow.  Will try a combination of aspirin and ultimately  Cardizem CD for symptom control at this point, and assuming she remains  clinically stable with reassuring evaluation, would anticipate discharge  with further assessment as an outpatient.  If she remains symptomatic,  whether paroxysmal atrial fibrillation or EAT/flutter, a referral to  electrophysiology would be in order to discuss options for  antiarrhythmic therapy (Tikosyn?) versus radiofrequency ablation.  We  will follow with you.      Jonelle Sidle, MD  Electronically Signed     SGM/MEDQ  D:  11/28/2008  T:  11/28/2008  Job:  [id number]   cc:   Catalina Pizza, M.D.  Fax: [phone]

## 2010-12-17 NOTE — Assessment & Plan Note (Signed)
OFFICE VISIT   [name], POLAND  DOB:  November 27, 1935                                        July 09, 2009  CHART #:  [id number]   HISTORY OF PRESENT ILLNESS:  The patient is status post emergency  subxiphoid pericardial window done by Dr. Donata Clay on June 12, 2009.  Postoperatively, the patient did well and was discharged to home 4 days  postoperatively.  Her pathology report showed no malignant cells.  There  was associated reactive mesothelial-type cells inflammation consistent  with fibrinous pericarditis.  The patient presents today for a follow up  visit.  Since discharge to the hospital, the patient states she had to  be readmitted on July 01, 2009, with complaints of chest pain, worse  with inspiration.  She was admitted to [facility] on July 01, 2009.  The patient was started on high dose of aspirin of 650 mg q.8  h. as well as colchicine.  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  A 2-D echocardiogram was done during this time which  was suggestive of developing restrictive physiology.  Cardiology planned  to repeat in 2 months.  At this time, the patient states she is feeling  much better.  She denies any recurrent chest pain.  She is still taking  the 650 mg of aspirin 3 times per day as well as the colchicine.  She  has an appointment with Dr. Diona Browner in the a.m.  The patient does state  that she has been taking the high-dose aspirin.  She has been nauseated.  She denies any vomiting.  Bowel movements have been normal and normal in  color.  She states that she is eating what she can.  The patient is up  ambulating without difficulty.  She denies any fever, shortness of  breath, opening or drainage from any of her incision sites.   PHYSICAL EXAMINATION:  Vital Signs:  Blood pressure 114/76, pulse 79,  respirations of 18, O2 sat is 96% on  room air.  Respiratory:  Clear to  auscultation bilaterally.  Cardiac:  Regular rate and rhythm.  Good S1  and S2 noted.  There is no murmur or rub noted.  Abdomen:  Soft,  nontender.  Positive bowel sounds x4.  Extremities:  No edema noted.  Incisions:  All incisions are clean, dry, and intact and healing well.   STUDIES:  The patient had PA and lateral chest x-ray done today which  shows no acute findings.  Pleural effusions noted from last chest x-ray  have nearly resolved.  Cardiac silhouette appears to be stable.   IMPRESSION AND PLAN:  The patient is progressing well status post  subxiphoid pericardial window.  The patient is to keep appointment with  Dr. Diona Browner in the a.m..  We will let Dr. Diona Browner determine when and  if need to decrease aspirin dose.  So this is most likely what is  causing her nausea.  She is to continue all current medications at this  time until seen by Dr. Diona Browner.  The patient is to continue ambulating  and progressing as tolerated.  She is to continue using her breathing  exercises.  At this time, the patient is stable status post subxiphoid  pericardial window.  We will release her from the practice, and the  patient was told plan to follow up as needed with Dr. Donata Clay.  The  patient is in agreement.   [name], M.D.  Electronically Signed   KMD/MEDQ  D:  07/09/2009  T:  07/10/2009  Job:  [id number]   cc:   Jonelle Sidle, MD

## 2010-12-17 NOTE — Assessment & Plan Note (Signed)
[facility]                       [place] CARDIOLOGY OFFICE NOTE   SHIRLEY, DECAMP                         MRN:          [id number]  DATE:09/21/2008                            DOB:          1935-09-12    PRIMARY CARE PHYSICIAN:  Catalina Pizza, MD   REASON FOR VISIT:  Scheduled followup.   HISTORY OF PRESENT ILLNESS:  I last saw Ms. Herne in our [facility] back in March 2009.  She saw Dr. Daleen Squibb in followup after an  adenosine Myoview that I had arranged in July 2009.  She has a  previously documented history of nonobstructive coronary artery disease  with episodes of recurrent atypical chest pain.  This study was  reassuring and overall low risk demonstrating possible mild apical  ischemia, but no large areas of ischemia with a normal ejection fraction  of 69%.  Symptomatically, Ms. Sollers describes occasional atypical chest  pains, but nothing predictable with exertion.  She does have occasional  palpitations and has taken half of her atenolol at times with good  relief.  She has had no dizziness or syncope.  She ran out of Crestor  and has been on no other specific lipid medicines.  She has not had  recent lipids.  Today, we talked about her medical therapy and resuming  Crestor with planned followup blood work.   ALLERGIES:  SULFA drugs.   PRESENT MEDICATIONS:  1. Aspirin 81 mg p.o. daily.  2. Vitamin B12 1000 mcg p.o. daily.  3. Oyster shell calcium with vitamin D 500 mg p.o. daily.  4. Lasix 20 mg p.o. b.i.d.  5. Imdur 30 mg p.o. daily.  6. Vitamin E 400 International Units p.o. daily.  7. Nexium 40 mg p.o. b.i.d.  8. Atenolol 50 mg p.o. daily.  9. Diovan 160 mg p.o. daily.  10.Folic acid 400 mcg p.o. daily.  11.Multivitamin daily.  12.Xanax 1 mg one half to one tablet as directed p.r.n.   REVIEW OF SYSTEMS:  As described above.  Otherwise negative.   PHYSICAL EXAMINATION:  VITAL SIGNS:  Blood pressure is 132/74; heart  rate  is 70 and regular; weight is 170 pounds, which is up 2 pounds from  her last visit.  GENERAL:  She is in no acute distress.  She has a resting tremor.  NECK:  No elevated jugular venous pressure.  No loud bruits.  No  thyromegaly is noted.  LUNGS:  Clear without labored breathing at rest.  CARDIAC:  Regular rate and rhythm.  Soft systolic murmur.  No  pericardial rub or S3 gallop.  ABDOMEN:  Soft, nontender.  No bruits.  EXTREMITIES:  Exhibit no frank pitting edema.  Distal pulses are 1+.  SKIN:  Warm and dry.  MUSCULOSKELETAL:  No kyphosis noted.  NEUROPSYCHIATRIC:  The patient is alert and oriented x3.   IMPRESSION AND RECOMMENDATIONS:  1. History of nonobstructive coronary artery disease with atypical      chest pain symptoms, sporadic in nature, and a reassuring Myoview      from July 2009 with normal ejection fraction, and no large areas  of      ischemia.  I have recommended continued medical therapy and      observation and she is comfortable with this.  Followup will be      over the next 6 months.  2. Intermittent palpitations.  She states that this is infrequent at      this point and not associated with any syncope.  I think it is      reasonable for her to take one half of her atenolol 50 mg dose when      this occurs.  If symptoms progress, I have recommended that we use      an event recorder for further assessment.  3. Hyperlipidemia, off of Crestor.  I recommended resuming her prior      dose at 20 mg daily and we will check a followup lipid profile and      liver function test.     Jonelle Sidle, MD  Electronically Signed    SGM/MedQ  DD: 09/21/2008  DT: 09/22/2008  Job #: [id number]   cc:   Catalina Pizza, M.D.

## 2010-12-17 NOTE — Assessment & Plan Note (Signed)
HEALTHCARE                            CARDIOLOGY OFFICE NOTE   [name], [name]                         MRN:          [id number]  DATE:10/08/2007                            DOB:          17-Mar-1936    PRIMARY CARE PHYSICIAN:  Dr. Butch Penny.   REASON FOR VISIT:  Cardiac follow-up.   HISTORY OF PRESENT ILLNESS:  [name] was seen back in September of  last year.  Prior cardiac history as detailed in my previous note.  Symptomatically, she continues to have intermittent somewhat atypically  described chest pain that is not clearly exertional.  Dyspnea on  exertion has been stable at NYHA class II.  Her electrocardiogram today  is normal showing sinus rhythm at 66 beats per minute.  She is  hypertensive on exam, but states that she did not take her medicines yet  today.  She states that Dr. Andee Lineman has increased Crestor dosing for  tighter cholesterol control.  She has been tolerating her medicines  otherwise.   ALLERGIES:  SULFA DRUGS.   MEDICATIONS:  1. Aspirin 81 mg p.o. daily.  2. Vitamin B12 1000 mcg p.o. daily.  3. Crestor 20 mg p.o. daily.  4. Calcium with vitamin D 500 mg p.o. daily.  5. Lasix 20 mg p.o. daily.  6. Imdur 30 mg p.o. daily.  7. Vitamin D supplements.  8. Nexium 40 mg p.o. b.i.d.  9. Atenolol 50 mg p.o. daily.  10.Diovan 160 mg p.o. daily.  11.Folic acid 400 mcg p.o. daily.  12.Xanax 1 mg 0.5 to 1 tablet p.o. p.r.n.   REVIEW OF SYSTEMS:  As per present illness.  She states she has  occasional dizziness when she turns her head certain why.  She had no  frank palpitations or syncope.  She occasionally feels a heart skip  but no orthopnea or PND noted.  Otherwise systems negative.   EXAMINATION:  Blood pressure is 192/87, heart rate is 67, weight is 174  pounds which is stable.  The patient is comfortable in no acute distress.  HEENT:  Conjunctiva, lids normal.  Pharynx clear.  NECK:  Supple.  No elevated jugular  venous pressure or loud bruits.  No  thyromegaly was noted.  LUNGS:  Clear with diminished breath sounds.  CARDIAC:  Exam reveals a regular rate and rhythm.  No S3 gallop or  pericardial rub.  ABDOMEN:  Soft, nontender.  Normoactive bowel sounds.  EXTREMITIES:  No significant edema.  Distal pulses are 2+.  SKIN:  Warm and dry.  MUSCULOSKELETAL:  No kyphosis noted.  Neuropsychiatric the patient alert and oriented x3.  Affect seems  appropriate today.   IMPRESSION/RECOMMENDATIONS:  1. History of nonobstructive coronary disease documented cardiac      catheterization back in July 2007.  The patient has baseline      atypical recurrent chest pain.  She has not had a follow-up      ischemic assessment since her catheterization.  Her medical regimen      is quite reasonable at this time.  I reviewed these issues with her  and we will plan a followup adenosine Myoview on medical therapy      this July to exclude any progressive ischemia.  Will plan to see      back around that time.  2. Hyperlipidemia, on Crestor.  Would aim for an LDL around 70.     Jonelle Sidle, MD  Electronically Signed    SGM/MedQ  DD: 10/08/2007  DT: 10/08/2007  Job #: [id number]   cc:   Angus G. Renard Matter, MD

## 2010-12-17 NOTE — H&P (Signed)
[name], [name]                  ACCOUNT NO.:  [id number]   MEDICAL RECORD NO.:  [id number]          PATIENT TYPE:  INP   LOCATION:  A317                          FACILITY:  [facility]   PHYSICIAN:  Catalina Pizza, M.D.        DATE OF BIRTH:  [birth year]   DATE OF ADMISSION:  11/28/2008  DATE OF DISCHARGE:  LH                              HISTORY & PHYSICAL   CHIEF COMPLAINT:  Weakness and rapid heart rate.   HISTORY OF PRESENT ILLNESS:  Ms. Caskey is a very pleasant 75 year old  white female with routine cardiac followup with Dr. Diona Browner who  approximately 6 weeks ago was seen and assessed by him and she was still  reporting palpitations and questioned whether she was having signs of  atrial fibrillation, paroxysmal at that time.  She also had a 21-day  monitor place which did not have those results.  She states she has had  4 episodes of the last couple of weeks where she is felt very weak with  rapid heart rate, but this is improved with rest.  This morning, she  stated little more severe and felt like she will pass out.  EMS was  called and on arrival, they found heart rate to be in the 170 range.  All other vital signs appeared to be normal.   PAST MEDICAL HISTORY:  Significant for hypertension, gastroesophageal  reflux disease, hyperlipidemia, has very minimal coronary artery disease  per calf, previous mention of class II type heart failure, although last  echo able to pull up showed EF of 60-65%.  She has chronic  anxiety.Previously told COPD, but has no smoking history.  She has  benign essential tremor.   CURRENT MEDICATIONS:  1. Isosorbide mononitrate 60 mg p.o. q.a.m.  2. Aspirin 81 mg p.o. daily.  3. Potassium chloride 20 mEq p.o. daily.  4. Diovan 160 mg p.o. daily.  5. Lasix 60 mg p.o. daily.  6. Crestor 20 mg p.o. daily.  7. Folic acid daily.  8. Calcium.  9. Magnesium daily.  10.Routine vitamin E injections monthly.   ALLERGIES:  TRIAVIL 4-50 and SULFA causing  rash.   SOCIAL HISTORY:  She lives with her husband here in [place], has an  adopted daughter who she takes care of.  She does not smoke or drink.   REVIEW OF SYSTEMS:  She feels much better overall at this time and  denies any rapid heart rate or still has a tremor, but did have some  chest pain earlier, but this resolved.   PHYSICAL EXAMINATION:  VITAL SIGNS:  On admission, heart rate was 154,  blood pressure 124/90, sating 98% on 2 L of oxygen.  T-current at time  of exam temperature 98.1, blood pressure 115/69, heart rate 72,  respirations are 18, sating 99% on 2 L of oxygen.  GENERAL:  This is a elderly white female, lying in bed, in no acute  distress.  HEENT:  Unremarkable.  LUNGS:  Clear to auscultation bilaterally.  No rhonchi or wheezing.  HEART:  Regular rate and rhythm.  Currently,  no tachycardia.  ABDOMEN:  Soft, nontender, nondistended.  Positive bowel sounds.  EXTREMITIES:  No lower extremity edema.  NEUROLOGIC:  The patient is alert and oriented x3.  Moving all  extremities without any difficulty.   Laboratory data today shows CBC shows white count of 7.5, hemoglobin  13.8, platelet count 193.  Cardiac marker initial myoglobin 104, MB of  1.5, troponin-I of less than 0.05.  BMET shows sodium 142, potassium  3.3, chloride 109, CO2 26, glucose 145, BUN 7, creatinine 0.75, calcium  of 9.6, magnesium was 2.2, and BNP was 51.1. Chest x-ray showed COPD  with no acute abnormalities.   IMPRESSION:  This is a 75 year old with what appears to be AFib with RVR  paroxysmal in nature as resolved with Cardizem bolus and Cardizem drip.   ASSESSMENT/PLAN:  1. Paroxysmal atrial fibrillation this resolved with Cardizem bolus      and will continue on low dose Cardizem until seen assessed by      cardiology and see about transition either to beta-blocker or to      Cardizem.  At this time, we will continue with full anticoagulation      with Lovenox and defer to cardiology as  far as treatment with      Coumadin given the multiple events that she has had maybe at this      time might be that she needs to be on Coumadin routinely.  Rely on      cardiology for any further input regarding this.  2. Nonobstructive coronary artery disease.  Had a cath done in July      2007, but had a recent stress test in July 2009 which was very low      risk for any possible mild apical ischemia.  We will cycle more      enzyme do not feel that she has significant acute coronary syndrome      at this time.  3. Hypokalemia.  We will replete her with potassium now.   DISPOSITION:  The patient will be admitted to telemetry and continue to  monitor her heart rate and get cardiology input on further therapy, will  need to follow up with cardiology on the recent monitor that she wore to  see if this in fact had showed any episodes of AFib.       Catalina Pizza, M.D.  Electronically Signed     ZH/MEDQ  D:  11/28/2008  T:  11/29/2008  Job:  [id number]

## 2010-12-17 NOTE — Op Note (Signed)
[name], WILLIS NO.:  [id number]   MEDICAL RECORD NO.:  [id number]          PATIENT TYPE:  INP   LOCATION:  3705                         FACILITY:  [facility]   PHYSICIAN:  Doylene Canning. Ladona Ridgel, MD    DATE OF BIRTH:  Apr 08, 1936   DATE OF PROCEDURE:  12/27/2008  DATE OF DISCHARGE:                               OPERATIVE REPORT   PROCEDURE PERFORMED:  Insertion of a dual-chamber pacemaker.   INDICATIONS:  Symptomatic tachy-brady syndrome.   INTRODUCTION:  The patient is a 75 year old woman with a history of  symptomatic bradycardia as well as atrial fibrillation with a rapid  ventricular response, who is now referred for permanent pacemaker  insertion secondary to tachy-brady syndrome.   PROCEDURE:  After informed consent was obtained, the patient was taken  to diagnostic EP lab in a fasting state.  After usual preparation and  draping, intravenous fentanyl and midazolam was given for sedation.  A  30 mL of lidocaine was infiltrated in the left infraclavicular region.  A 5-cm incision was carried out over this region, and electrocautery was  utilized to dissect down to the fascial plane.  The left subclavian vein  was punctured x2 and a Medtronic model 5076, 52-cm active-fixation  pacing lead, serial number [id number] was advanced to the right  ventricle and a Medtronic model 5076, 45-cm active-fixation pacing lead,  serial number [id number] was advanced to the right atrium.  Mapping was  carried out in the right ventricle.  At the final site, the R-waves were  12, the impedance was 974 ohms, and the threshold 0.4 V at 0.5 msec.  With the ventricular lead in satisfactory position, attention was then  turned to placement of the atrial lead, which was placed in the  anterolateral portion of the right atrium where P waves measured to 2.5  mV, the pacing impedance was 595 ohms, and the threshold 0.6 volts at  0.5 msec.  A 10-V pacing both in the right atrium and the  right  ventricle did not stimulate the diaphragm and satisfactory injury  currents were present with active fixation of the lead.  With these  satisfactory parameters, the leads were secured to the subpectoralis  fascia with a figure-of-eight silk suture, and the sewing sleeve was  also secured with silk suture.  Electrocautery was utilized to make  subcutaneous pocket and gentamicin irrigation was utilized to irrigate  the pocket.  The Medtronic Adapta L dual-chamber pacemaker, serial  number [id number] was connected to the atrial and RV leads and placed  back in the subcutaneous pocket.  Generator was secured with silk  suture.  The pocket was irrigated with gentamicin, and the incision was  closed with 2-0 Vicryl and 3-0 Vicryl.  Benzoin was painted on the skin,  Steri-Strips were applied, and a pressure dressing was placed.  The  patient was returned to her room in satisfactory condition.   COMPLICATIONS:  There were no immediate procedure complications.   RESULTS:  Demonstrate successful implantation of a Medtronic dual-  chamber pacemaker in a patient with symptomatic tachy-brady syndrome.  Doylene Canning. Ladona Ridgel, MD  Electronically Signed     GWT/MEDQ  D:  12/27/2008  T:  12/27/2008  Job:  [id number]   cc:   Jonelle Sidle, MD  Ishmael Holter. Renard Matter, MD

## 2010-12-17 NOTE — Assessment & Plan Note (Signed)
[facility]                       [place] CARDIOLOGY OFFICE NOTE   Katrina, [name]                         MRN:          [id number]  DATE:12/08/2008                            DOB:          [birth year]    CARDIOLOGIST:  Jonelle Sidle, MD.   PRIMARY CARE PHYSICIAN:  Catalina Pizza, MD.   REASON FOR VISIT:  Post hospitalization followup.   HISTORY OF PRESENT ILLNESS:  [name] is a 75 year old female patient  with history of moderate nonobstructive coronary disease as well as  palpitations.  She was recently admitted to [facility] with  evidence of paroxysmal supraventricular tachycardia.  It was felt that  this was likely atrial fibrillation.  However, there were some strips  that looked suspicious for atypical atrial flutter versus ectopic atrial  tachycardia.  In any event, the patient was extremely symptomatic with  this.  She was observed and ruled out for myocardial infarction by  enzymes.  Her echocardiogram demonstrated an EF of 55-60%, mild LVH,  diastolic dysfunction and mild left atrial enlargement.  She had  significant bradycardia in the hospital, demonstrating a component of  sick sinus syndrome/tachybrady syndrome.  She had previously been on  atenolol.  Prior to admission she had seen me in the office and I had  set her up for an event monitor.  However, the patient only had one  strip called in and this was the baseline reading.  Unfortunately, she  was not certain on how to operate the monitor.  Her atenolol was  discontinued in the hospital.  She was placed on verapamil at 120 mg a  day as well as acebutolol at 400 mg a day.  Acebutolol was chosen  because it is a cardio-selective beta-blocker with intrinsic  sympathomimetic activity.  We were trying to keep her from having  significant bradycardia and also prevent her from having significant  symptomatic arrhythmias.  She returned a couple days ago for followup  blood pressure check and noted that she was feeling poorly and she was  added on for this visit today.  She continues to feel weak.  She  continues to have episodes of significant tachy palpitations.  She is  somewhat of a poor historian and cannot really tell me how long these  episodes last.  However, her weakness definitely worsens during these  episodes.  She has chronic chest pain and there really has been no  significant change.  She notes significant shortness of breath with  exertion.  This seems to be ongoing and chronic and related to her COPD.  She does note worsening shortness of breath when she has an episode of  palpitations.  She also has episodes that sound very consistent with  near syncope.  She denies any frank syncope.   CURRENT MEDICATIONS:  B12, Aspirin 81 mg daily, Oyster shell calcium,  Furosemide 60 mg daily, Isosorbide 30 mg daily, Vitamin E, Nexium 40 mg  b.i.d., Diovan 160 mg daily,  Folic acid daily, Multivitamin daily, Crestor 20 mg daily, Acebutolol  400 mg daily, Verapamil ER 120 mg daily,  Xanax p.r.n.,   PHYSICAL EXAM:  GENERAL:  She is a well-nourished, well-developed female  in no acute distress.  VITAL SIGNS:  Blood pressure is 110/62, pulse 49, weight 166 pounds.  HEENT:  Normal.  NECK:  Without JVD.  LYMPHS:  Without lymphadenopathy.  CARDIAC:  Normal S1-S2, regular rate and rhythm.  LUNGS:  Lungs with decreased breath sounds bilaterally.  No rales.  No  wheezing.  ABDOMEN:  Soft, nontender.  EXTREMITIES:  Without edema.  NEUROLOGIC:  She is alert and oriented x3.  Cranial nerves II-XII  grossly intact.  SKIN:  Warm and dry.   Electrocardiogram demonstrates sinus bradycardia with a heart rate of  49, normal axis, no acute changes.   ASSESSMENT AND PLAN:  1. Paroxysmal supraventricular tachycardia.  As noted, this is mainly      paroxysmal atrial fibrillation with rapid ventricular response.      However, she did have some strips in the  hospital that looked very      regular and were suspicious for atypical atrial flutter versus      ectopic atrial tachycardia.  She is very symptomatic with her      arrhythmia.  Her CHADS2 score is low and she remains on aspirin.      She has not really responded that much to the therapy that she has      had thus far.  She is demonstrating signs of sick sinus      syndrome/tachybrady syndrome.  I discussed her case further with      Dr. Diona Browner.  At this point we plan to refer her to      electrophysiology for further recommendations.  She may be a      candidate for Tikosyn or for device implantation if bradycardia      limits therapy.  Given her significant bradycardia, I will      discontinue her acebutolol and try to continue on the verapamil.      She has been instructed to take atenolol 25 mg daily p.r.n.      breakthrough tachy palpitations.  She knows to go to the emergency      room should she become more symptomatic or if she develops syncope.  2. Chest pain.  As noted previously she has had a history of      nonobstructive coronary disease in the past.  She had a nonischemic      Myoview study in July of 2009.  Her symptoms are very atypical and      unchanging.  No further workup is warranted at this time.   DISPOSITION:  The patient will be brought back in followup with either  me or Dr. Diona Browner in 3 months.  As noted above she will be referred to  Dr. Ladona Ridgel for electrophysiology evaluation.      Tereso Newcomer, PA-C  Electronically Signed      Jonelle Sidle, MD  Electronically Signed   SW/MedQ  DD: 12/08/2008  DT: 12/08/2008  Job #: [id number]   cc:   Catalina Pizza, M.D.

## 2010-12-17 NOTE — Assessment & Plan Note (Signed)
[facility]                       [place] CARDIOLOGY OFFICE NOTE   [name], [name]                         MRN:          [id number]  DATE:10/13/2008                            DOB:          11-03-1935    CARDIOLOGIST:  Jonelle Sidle, MD   PRIMARY CARE PHYSICIAN:  Catalina Pizza, MD   REASON FOR VISIT:  Palpitations.   HISTORY OF PRESENT ILLNESS:  [name] is a 75 year old female patient  with a history of moderate nonobstructive coronary artery disease by  cardiac catheterization in July 2007.  At that time, she had a left main  stenosis of 20%, LAD 30%, proximal 40%, mid second diagonal 40%, ostial  circumflex 30% proximal, 20% mid, RCA 60% mid  and 30-40% mid.  Her EF  was normal.  She was recently assessed with a Myoview study in July  2009.  This was low risk with possible mild apical ischemia.  She has  been treated medically.  She has had some atypical chest pain over the  years.  She recently noted some palpitations and Dr. Diona Browner saw her in  February 2010.  At that time, he recommended to take an extra half of  atenolol and also noted that if she had recurrent symptoms in the  future, she would probably require event monitoring.  She developed  palpitations yesterday.  She said that these were rapid and lasted for  about 3-4 hours.  She felt near syncopal with it.  She denies any  syncope.  She did take an extra half of atenolol which helped her  symptoms.  She has had several of these since she last saw Dr. Diona Browner.  She continues to note atypical chest pain.  This is fairly constant and  described as an ache in her chest.  It is not really brought on by  exertion.  She continues to have significant dyspnea on exertion.  This  sounds as though it is related to her COPD.  She does note that she  sleeps on three pillows and does occasionally awake, short of breath at  night.  She denies any significant pedal edema.   CURRENT  MEDICATIONS:  B12, Aspirin 81 mg daily, Oyster shell calcium,  Furosemide 20 mg b.i.d., Isosorbide mononitrate 30 mg daily, Vitamin E,  Nexium 40 mg b.i.d., Atenolol 50 mg daily,  Diovan 160 mg daily, Folic  acid, Multivitamin, Crestor 20 mg daily, Xanax p.r.n.   PHYSICAL EXAMINATION:  GENERAL:  She is a well-nourished, well-developed  female in no acute distress.  VITAL SIGNS:  Blood pressure is 153/81, pulse 62, and weight 168 pounds.  ORTHOSTATIC VITAL SIGNS:  Lying 143/84 with the pulse is 62, sitting  145/87 with the pulse of 66, standing 155/85 with pulse 74, after 2  minutes, 154/89 with a pulse of 67, after 5 minutes 177/92 with pulse of  70.  HEENT:  Normal.  NECK:  Without JVD.  CARDIAC:  Normal S1 and S2.  Regular rate and rhythm.  LUNGS:  Clear to auscultation bilaterally.  No rales.  No wheezing.  ABDOMEN:  Soft  and nontender.  EXTREMITIES:  With trace edema bilaterally.  NEUROLOGIC:  She is alert and orient x3.  Cranial nerves II-XII grossly  intact.   Electrocardiogram reveals sinus rhythm with a heart rate of 69, normal  axis, no acute changes.   ASSESSMENT AND PLAN:  1. Palpitations.  She is certainly at risk for atrial fibrillation      given her longstanding hypertension.  Her CHADS2 score is really 1      with history of hypertension.  She does not have a history of      diabetes mellitus, stroke, or heart failure.  Her last      echocardiogram in 2008 demonstrated an ejection fraction of 60-65%      with mild mitral regurgitation.  She will be set up for 21-day      event monitor to rule out significant dysrhythmia.  She will      continue on aspirin for now.  I have elected not to adjust her beta-      blocker until we can assess her further with the event monitor.      She can continue taking an extra half of atenolol p.r.n.  Lab work      today will include a BMET and TSH.  2. Dyspnea on exertion.  She has been recently assessed with Myoview      study.  I  do not think she needs further ischemic workup.  I think      most of her shortness of breaths probably is secondary to chronic      obstructive pulmonary disease.  She does sleep on three pillows and      has done this for a few months now.  I will add a BNP to the blood      work for palpitations to ensure she does not have any signs of      underlying heart failure.  3. Hypertension.  This is somewhat uncontrolled today.  I have      recommended that she continue on her above medications.  After she      completes her monitor, we can likely increase her atenolol to 75 mg      daily.   DISPOSITION:  The patient will be brought back in followup with Dr.  Diona Browner in the next 4-6 weeks or sooner p.r.n.      Tereso Newcomer, PA-C  Electronically Signed      Jonelle Sidle, MD  Electronically Signed   SW/MedQ  DD: 10/13/2008  DT: 10/14/2008  Job #: [id number]   cc:   Catalina Pizza, M.D.

## 2010-12-17 NOTE — Discharge Summary (Signed)
NAMESYRIANA, CROSLIN NO.:  [id number]   MEDICAL RECORD NO.:  [id number]          PATIENT TYPE:  INP   LOCATION:  3705                         FACILITY:  [facility]   PHYSICIAN:  Doylene Canning. Ladona Ridgel, MD    DATE OF BIRTH:  12-Mar-1936   DATE OF ADMISSION:  12/27/2008  DATE OF DISCHARGE:  12/28/2008                               DISCHARGE SUMMARY   ALLERGIES:  This patient has allergies to SULFA and ADHESIVE TAPE.   TIME FOR THIS DICTATION:  Greater than 40 minutes.   PRIMARY CARE PHYSICIAN:  Angus G. Renard Matter, MD   FINAL DIAGNOSES:  1. History of atrial fibrillation with both fast and slow rates.      a.     Symptoms of dizziness and lightheadedness.      b.     Tachybrady syndrome.      c.     Medical therapy with heparin due to possible exacerbation of       bradycardia.  2. Discharging postoperative day #1 status post implant of a Medtronic      ADAPTA L dual-chamber pacemaker by Dr. Lewayne Bunting.  3. Discharging in normal sinus rhythm with intermittent pacing.  4. Possible up-titration of atrioventricular blocking agents or      addition of antiarrhythmic or both post pacer per Dr. Ladona Ridgel to be      decided at [facility] office visit.   SECONDARY DIAGNOSES:  1. Echocardiogram on November 28, 2008:  Ejection fraction 55-60%,      trivial mitral regurgitation, and trivial tricuspid regurgitation.  2. Left heart catheterization, July 2007:  Ejection fraction 60%, 60%      mid right coronary artery stenosis, nonobstructive disease in the      left anterior descending and diagonals and in the circumflex and      obtuse marginals.  3. Myoview study, February 21, 2008:  Ejection fraction 69%, possible mild      apical ischemia.  4. Hypertension.  5. Resting tremor.  6. Vitamin B12 deficiency.  7. Irritable bowel syndrome.  8. Gastroesophageal reflux disease.  9. Anxiety.  10.Dyslipidemia.  11.Chronic obstructive pulmonary disease.  12.History of atrial fibrillation.   PROCEDURES:  Dec 27, 2008, implant of the Medtronic ADAPTA dual-chamber  pacemaker by Dr. Lewayne Bunting.  The patient had no postprocedural  complications.  Chest x-ray shows no pneumothorax.  The leads are in  appropriate position.  The device has been interrogated.  All values  within normal limits.  Movement of the left arm and incision care have  been discussed with the patient.   BRIEF HISTORY:  Ms. [name] is a 75 year old female.  She has a  symptomatic tachybrady syndrome and was referred to Dr. Ladona Ridgel by Dr.  Diona Browner.  She has a history of atrial fibrillation with rapid  ventricular rates and she also has bradycardia in the 40s.  She has  never had frank syncope but she does experience dizziness and  lightheadedness.  She has not been started on antiarrhythmics because  they may exacerbate her bradycardia.  She gets dyspneic and  has chest  pressure when she goes out of rhythm.  Dr. Ladona Ridgel in conclusion has  discussed with the patient indications and need for permanent pacemaker,  a dual-chamber device and this will be done at the first available  opportunity.   HOSPITAL COURSE:  The patient presents electively on Dec 27, 2008.  She  underwent implant of the dual-chamber Medtronic device the same day.  She is ready for discharge postprocedure day #1.  She has had no  hematoma.  The device interrogates well.  Chest x-ray shows no  pneumothorax and leads are in appropriate position.  Once again incision  care and mobility of the left arm has been discussed with the patient.  The patient is asked to keep her incision dry for the next 7 days and to  sponge bathe until Wednesday, January 03, 2009.   DISCHARGE MEDICATIONS:  1. Isosorbide mononitrate 30 mg daily.  2. Enteric-coated aspirin 325 mg daily.  3. Potassium chloride 20 mEq daily.  4. Diovan 160 mg daily.  5. Crestor 20 mg daily at bedtime.  6. Folic acid 1 mg 1 tablet daily.  7. Vitamin B12 1000 mcg injected monthly.  8.  Calcium carbonate daily.  9. Magnesium oxide 400 mg daily.  10.Nitroglycerin patch 0.4 mg per hour, apply the patch in the morning      and remove at bedtime.  11.Alprazolam 0.5 mg as needed every 8 hours.  12.Nexium 40 mg daily.  13.Verapamil 120 mg 1 tablet daily.  14.Atenolol 50 mg 1 tablet daily.  15.Benzonatate 100 mg twice daily as needed.  16.Furosemide 20 mg tablets 3 tablets daily.   The patient will receive oral analgesic if required.   FOLLOWUP APPOINTMENTS:  At [facility] at the [facility] office,  all of them:  1. To see Dr. Ival Bible physician assistant Mr. Tereso Newcomer,      Wednesday, January 03, 2009 at 11:20.  2. To see the [facility], Friday, January 12, 2009 at 2 o'clock.  3. Dr. Ladona Ridgel, Wednesday, April 04, 2009 at 8:30.   LABORATORY DATA:  This admission were drawn on Dec 21, 2008.  Sodium  139, potassium 3.8, chloride 103, carbonate 30, glucose 103, BUN is 10,  and creatinine 0.7.  White cells 6.8, hemoglobin 12.2, hematocrit 35.8,  and platelets were 227.  Protime 11.8.  INR 1.1.      Maple Mirza, PA      Doylene Canning. Ladona Ridgel, MD  Electronically Signed    GM/MEDQ  D:  12/28/2008  T:  12/29/2008  Job:  [id number]   cc:   Jonelle Sidle, MD  Ishmael Holter. Renard Matter, MD

## 2010-12-17 NOTE — Assessment & Plan Note (Signed)
[facility]                            CARDIOLOGY OFFICE NOTE   NAME:[name], Katrina TASSIN                         MRN:          [id number]  DATE:04/12/2007                            DOB:          16-Sep-1935    PRIMARY CARE PHYSICIAN:  Angus G. Renard Matter, M.D.   REASON FOR VISIT:  Routine cardiac followup.   HISTORY OF PRESENT ILLNESS:  I saw [name] back in March and she was  recently seen by Dr. Dietrich Pates in our [place] office back in July.  She has chronic dyspnea on exertion which is more than likely  multifactorial and also associated with atypical chest pain.  She has  had fairly extensive evaluations over time that have been overall  reassuring from a cardiac perspective.  We have been medically managing  mild diastolic dysfunction and I have spoken with [name] about  weight loss and general risk factor modification.   She saw Dr. Dietrich Pates who spoke with her about Statin therapy, although  she was not interested in starting this.  He also mentioned the  possibility of discontinuing beta-blockers.  [name] states that she  made no changes in her regimen and comes in today preferring to follow  up in the [place] office.   I went over her medications.  She does not report any major change in  her dyspnea on exertion.  She has had some recent death in the family  and has been under stress due to this.  Her electrocardiogram is stable  showing overall low voltage with decreased R wave progression, possibly  due to lead placement.  She states that she is not smoking but is around  a fair degree of second hand smoke.  She reports that her weight has  been stable.   ALLERGIES:  SULFA DRUGS.   PRESENT MEDICATIONS:  1. Aspirin 81 mg p.o. daily.  2. Nexium 40 mg p.o. b.i.d.  3. Diovan HCT 160/25 mg p.o. daily.  4. Folic acid 400 mg, two tablets p.o. daily.  5. Atenolol 50 mg p.o. daily.  6. Multivitamin daily.  7. Lasix 40 mg p.o.  b.i.d.  8. Imdur 30 mg p.o. daily.   REVIEW OF SYSTEMS:  As described in the history of present illness.   PHYSICAL EXAMINATION:  VITAL SIGNS:  Blood pressure 132/78, heart rate  is 59, weight is 173 pounds.  NECK:  Reveals no loud carotid bruits.  No elevated jugular venous  pressure.  LUNGS:  Clear with somewhat diminished breath sounds.  No labored  breathing.  CARDIAC:  Reveals a regular rate and rhythm.  No loud murmur.  Soft S4.  No pericardial rub.  EXTREMITIES:  Reveal no pitting edema.   IMPRESSION AND RECOMMENDATIONS:  1. Chronic dyspnea on exertion, likely multifactorial.  We will plan      to continue medical therapy.  I have spoken with her about weight      loss, general exercise, and avoiding second-hand smoke.  She      reports that she thinks some of her symptoms may be due to  lung      disease from working in the [employer],  although her pulmonary      function tests were fairly reassuring in the past.  We will plan to      continue her present regimen and I can see her back in the next 6      months.  2. Hyperlipidemia with previously documented nonobstructive coronary      atherosclerosis.  She was no Lipitor in the past, although has      decided to stop taking this, concerned about possible side effects.      She is not interested in beginning any other Statin therapy at this      time.  We can review this with her again.     Jonelle Sidle, MD  Electronically Signed    SGM/MedQ  DD: 04/12/2007  DT: 04/12/2007  Job #: [id number]   cc:   Angus G. Renard Matter, MD

## 2010-12-17 NOTE — Letter (Signed)
February 17, 2007    Catalina Pizza, M.D.  [address]  [place],  [place] [address]   RE:  [name], [name]  MRN:  [id number]  /  DOB:  10-04-1935   Dear Ian Malkin,   It was my pleasure evaluating Ms. Sanford in the office today at your  request.  As you know, this nice woman has previously been a patient of  Dr. Myrtis Ser, Dr. Diona Browner, and Dr. Dorethea Clan.  Due to the cost of travel, she  asked if she could be followed in the [place] office.   She has chronic dyspnea on exertion approximately class II-class III.  Nonetheless, she  does all of her housework and helps to care for a 44-  year-old child as well.  She has exertional symptoms but also sometimes  awakens at night with severe dyspnea.  She has had multiple extensive  evaluations without a definite specific etiology identified.  Her  problems may well be multifactorial.  She has nonobstructive coronary  disease, history of hypertension with probably some hypertensive heart  disease and COPD with a 45 pack year history of cigarette smoking that  was discontinued 20 years ago.  Pulmonary function tests in 2007 were  not at all bad.  She has taken bronchodilators in the past without  benefit.   CURRENT MEDICATIONS:  1. Diovan HCT 160/25 mg daily.  2. Nexium 40 mg b.i.d.  3. Aspirin 81 mg daily.  4. Clonazepam 0.5 mg t.i.d.  5. Folate 800 mcg daily.  6. B12 1000 mcg daily.  7. Primidone 250 mg daily.  8. Vitamin D and calcium supplement.  9. Atenolol 50 mg daily.   PHYSICAL EXAMINATION:  Pleasant woman in no acute distress.  The weight  is 174, 4 pounds more than in March 2008.  Blood pressure 115/80, heart  rate 70 and regular, respiration 16.  NECK:  No jugular venous distention; no carotid bruits.  LUNGS:  Clear.  CARDIAC:  Normal first and second heart sounds; 4th heart sound present.  ABDOMEN:  Soft and nontender; no organomegaly; aortic pulsation not  palpable.  EXTREMITIES:  Trace edema; normal distal pulses.   Prior laboratories were  reviewed.  Moderate hyperlipidemia was present  in the past with total cholesterol of 245 and LDL of 161.   IMPRESSION:  Ms. Arrazola is clinically stable.  Her dyspnea probably most  reflects a component of hypertensive heart disease, chronic obstructive  pulmonary disease, physical deconditioning, kyphosis, and excessive  weight.  I do not believe that further testing is warranted at this  time.   Hypertension is under good control.  We discussed whether  discontinuation or change in beta blocker might result in improved sense  of well being.  She reports that she experiences intolerable  palpitations if she is not taking a beta blocker.   I discussed with her the need for statin treatment for hyperlipidemia  and mild coronary artery disease.  She is quite resistant to this.  She  has taken Vytorin in the past, which she claims you discontinued.  She  refuses to take Lipitor because it kills people.  She has samples of  Crestor but uses them intermittently and does not wish to take any of  these medications continuously.  Ms. Din has no other issues that  require continuing care by a cardiologist, but I would be more than  happy to see her at any time you deem appropriate.    Sincerely,  Gerrit Friends. Dietrich Pates, MD, [facility]  Electronically Signed    RMR/MedQ  DD: 02/17/2007  DT: 02/18/2007  Job #: [id number]

## 2010-12-17 NOTE — Assessment & Plan Note (Signed)
[facility]                       [place] CARDIOLOGY OFFICE NOTE   REESHEMAH, NAZARYAN                         MRN:          [id number]  DATE:03/15/2008                            DOB:          10/12/1935    Ms. Trovato returns today for followup of her nonobstructive coronary  artery disease, dyspnea on exertion, and chronic diastolic heart  failure.   She saw Dr. Nona Dell, who arranged for her to have a stress  Myoview.  This showed a minimal apical defect consistent with previous  findings in 2007.  Her overall ejection fraction was 69%.   She still has some dyspnea on exertion and some atypical chest pain.  It  does not sound anginal.  She also sleeps on 3 pillows and has  significant reflux.  Sometimes, she wakes up short of breath at night.   She has a history of hypertension, which has gone up and down.   Her medications are unchanged since her last visit.  Please refer to Dr.  Ival Bible note and to our [facility].   PHYSICAL EXAMINATION:  VITAL SIGNS:  Her blood pressure in the right arm  with a large cuff today is 138/75, her pulse is 62 and regular.  Her  weight is 168, which is down 6.  NECK:  Shows no obvious JVD.  Carotids are full.  Thyroid is not  enlarged.  Trachea is midline.  LUNGS:  Clear.  HEART:  Reveals a regular rate and rhythm.  No S4 gallop.  ABDOMEN:  Soft.  Good bowel sounds.  Organomegaly is difficult to  assess.  EXTREMITIES:  Reveal very little, if any, edema.  Pulses are intact.  NEURO:  Intact.   Ms. Putz is stable from our standpoint.  She is on good medical  program.  I think keeping the heart rate well controlled as it is today  as well as blood pressure is the key for the future with limiting her  symptoms.   We will plan on seeing her back in 6 months.     Thomas C. Daleen Squibb, MD, [facility]  Electronically Signed    TCW/MedQ  DD: 03/15/2008  DT: 03/15/2008  Job #: [id number]   cc:   Catalina Pizza, M.D.

## 2010-12-17 NOTE — Group Therapy Note (Signed)
NAME:  [name], [name] NO.:  [id number]   MEDICAL RECORD NO.:  [id number]          PATIENT TYPE:  INP   LOCATION:  A317                          FACILITY:  [facility]   PHYSICIAN:  Catalina Pizza, M.D.        DATE OF BIRTH:  [birth year]   DATE OF PROCEDURE:  11/29/2008  DATE OF DISCHARGE:                                 PROGRESS NOTE   SUBJECTIVE:  [name] is a pleasant 75 year old white female who  presented with heart arrhythmia yesterday, felt very weak, presyncopal  and was seen and evaluated by cardiology yesterday evening.  Initially  thought to have atrial fibrillation with RPR but unclear as multiple  arrhythmia possibilities, felt that it could be related to SVT.  Was  started on Cardizem drip and transitioned over to Cardizem orally and is  currently doing well.  She has no complaints and is requesting to go  home today if possible.   OBJECTIVE:  VITAL SIGNS:  Temperature is 96.9, blood pressure 123/67,  pulse is 55, respiration rate 18 and saturating 94% on room air.  GENERAL:  This is an elderly female lying bed in no acute distress.  HEENT:  Unremarkable.  LUNGS:  Clear to auscultation bilaterally.  HEART:  Regular rate and rhythm, slow, no murmurs appreciated.  ABDOMEN:  Soft, nontender, nondistended; positive bowel sounds.  EXTREMITIES:  No lower extremity edema.  NEUROLOGIC:  Does have some mild essential tremor, right greater than  left.   LABORATORY DATA:  Sodium 143, potassium 4.6, chloride 110, CO2 28,  glucose 98, BUN of 7, creatinine 0.74, calcium 9.3, white count 4.6,  hemoglobin 12.3, platelet count of 167, CK of 98, MB of 2.8, troponin-I  of 0.03.   IMPRESSION:  This 75 year old with nonspecific arrhythmia, question  supraventricular tachycardia versus atrial tachycardia versus atrial  flutter.   ASSESSMENT/PLAN:  Arrhythmia.  Followed by cardiology and will make  adjustment to the medicines.  We will follow along with them.  She is  having  a 2-D echo done today and will cycle another enzyme 2 p.m.  If  okay with them, the patient will be discharged later today and follow-up  with further outpatient management of this.  Will resume all medicines  as previously prescribed and will follow along with cardiology if any  further input they might have.      Catalina Pizza, M.D.  Electronically Signed     ZH/MEDQ  D:  11/29/2008  T:  11/29/2008  Job:  [id number]

## 2010-12-17 NOTE — Discharge Summary (Signed)
[name], [name]                  ACCOUNT NO.:  [id number]   MEDICAL RECORD NO.:  [id number]          PATIENT TYPE:  INP   LOCATION:  A317                          FACILITY:  [facility]   PHYSICIAN:  Catalina Pizza, M.D.        DATE OF BIRTH:  20-Feb-1936   DATE OF ADMISSION:  11/28/2008  DATE OF DISCHARGE:  04/29/2010LH                               DISCHARGE SUMMARY   DISCHARGE DIAGNOSES:  1. Heart arrhythmia question supraventricular tachycardia versus      paroxysmal atrial flutter versus tachy-brady syndrome?.  2. Nonobstructive coronary artery disease.  3. Hypokalemia.  4. Right shoulder pain.  5. Benign essential tremor.   DISCHARGE MEDICATIONS:  1. Isosorbide mononitrate 30 mg p.o. daily.  2. Aspirin 325 mg p.o. daily.  3. Potassium chloride 20 mEq p.o. daily.  4. Diovan 160 mg p.o. daily.  5. Lasix 60 mg p.o. daily.  6. Crestor 20 mg p.o. daily.  7. Folic acid once daily.  8. Calcium and magnesium daily.  9. Vitamin B injections monthly.  10.She is to stop her atenolol and start on Cardizem either 30 mg      t.i.d. versus Cardizem CD 120 mg once daily, still awaiting final      incision from Cardiology.   BRIEF HISTORY OF PRESENT ILLNESS:  [name] is a 75 year old who has  had several bouts of palpitations and had a heart monitor on for  approximately 3 weeks up until about 10 days ago and had a near syncopal-  type spell with heart rate running up in to the 170s by the time EMS got  there.  Strips not exactly sure whether related to atrial abnormality or  SVT but Cardiology is following closely for this.  She was admitted for  further cardiology assessment and rate control.   PHYSICAL EXAM:  At time of discharge temperature 97.6, blood pressure  105/60, pulse 65, respiration 20, saturating 93% on room air.  GENERALLY:  This is an elderly female lying in bed in no acute stress.  HEENT:  Unremarkable.  LUNGS:  Good air movement throughout, did not appreciate any rhonchi  or  wheezing.  HEART:  Currently regular rate and rhythm.  No murmurs, gallops or rubs.  ABDOMEN:  Soft, nontender, nondistended, positive bowel sounds.  EXTREMITIES:  No lower extremity edema.  NEUROLOGIC:  The patient is alert and oriented x3.  Does have mild  benign essential tremor, right greater than left.   LABORATORY DATA:  Obtained during hospitalization, initial CBC showed a  white count of 7.5, hemoglobin 13.8, platelet count of 193.  BMET  initially showed sodium 142, potassium 3.3, chloride 109, CO2 - 26,  glucose 145, BUN 7, creatinine 0.75, calcium of 9.6, magnesium was 2.2,  BNP was 51.1.  Cardiac panel has been CK of 98, MB of 2.6, troponin-I of  0.03 and repeat shows CK of 83, MB of 2.1, troponin-I of 0.04.  Chest x-  ray obtained during hospitalization showed COPD and no acute  abnormalities.   Echo report:  A 2D echo showed mild wall  thickness, increase in pattern,  mild LVH, EF approximately 55% - 60%, aortic valve and mitral valve are  not significant, left atrium mildly dilated.   HOSPITAL COURSE PER PROBLEM:  1. Heart arrhythmia, question supraventricular tachycardia versus      atrial tachycardia versus atrial flutter.  Not specifically      determined at this time but patient was started on Cardizem drip      initially and converted back to sinus rhythm, has remained in sinus      rhythm on oral Cardizem for more than 24 hours.  Actually became a      little more bradycardic with that.  She was started on atenolol      before and has been stopped during hospitalization and Cardiology      has suggested this change her to the Cardizem only and increase her      aspirin a full does.  Do not feel a need to fully anticoagulate at      this time although initially was with Lovenox and had just on      prophylactic dose during the hospitalization.  She will need close      follow-up with Cardiology, see about adjusting of her medicines and      question whether if  tachy-brady syndrome may benefit from having a      pacemaker but will be determined as outpatient with further workup.      Did not have the 21-day recordings but Dr. Diona Browner was to try      track this down and see if there were any events that occurred      during that time which may elaborate on exactly what arrhythmia she      might be having.  2. Hypokalemia, she was repleted and on recheck was back to normal.  3. Chronic obstructive pulmonary disease.  She does not having any      significant smoking history but has been labeled with chronic      obstructive pulmonary disease.  She definitely has an asthmatic      type component to it.  Currently is not having any problems with      her breathing.  O2 sats in the 91% - 93% range on room air, is      probably her baseline.  4. Right shoulder pain.  She states this right shoulder pain has been      going on for a prolonged period of time, will work this up as an      outpatient.  Question whether some rotator cuff-type issue.   DISPOSITION:  Will follow up with me in approximately 2 weeks as well as  Cardiology within 1 - 2 weeks, see if any further intervention or any  other problems have recurred.      Catalina Pizza, M.D.  Electronically Signed     ZH/MEDQ  D:  11/30/2008  T:  11/30/2008  Job:  [id number]

## 2010-12-20 NOTE — Cardiovascular Report (Signed)
NAME:  TELIYAH, ROYAL NO.:  [id number]   MEDICAL RECORD NO.:  [id number]          PATIENT TYPE:  INP   LOCATION:  3731                         FACILITY:  [facility]   PHYSICIAN:  Salvadore Farber, M.D. LHCDATE OF BIRTH:  [birth year]   DATE OF PROCEDURE:  06/12/2004  DATE OF DISCHARGE:                              CARDIAC CATHETERIZATION   PROCEDURE:  1.  Left heart catheterization.  2.  Left ventriculography.  3.  Coronary angiography.   INDICATION:  Ms. Humm is a 75 year old lady with hypertension and  dyslipidemia, as well as anxiety, who presents with chest discomfort  questionably relieved with nitroglycerin.  In the [facility],  point-of-care markers were negative, but first troponin was slightly  elevated.  Subsequent troponins were negative, calling into question lab  error.  An adenosine Cardiolite demonstrated anteroapical reversible defect.  She was therefore referred for diagnostic angiography.   PROCEDURAL TECHNIQUE:  Informed consent was obtained.  Under 1% lidocaine  local anesthesia, a 6-French sheath was placed in the right common femoral  artery using a modified Seldinger technique.  Diagnostic angiography and  ventriculography were performed using JL4, JR4, and pigtail catheters.  The  patient tolerated the procedure well and the sheaths were removed.  The  patient tolerated the procedure well and was transferred to the holding room  in stable condition.   COMPLICATIONS:  None.   FINDINGS:  1.  LV:  158/9/17.  EF 65% without regional wall motion abnormality.  2.  No aortic stenosis or mitral regurgitation.  3.  Left main angiographically normal.  4.  LAD:  Moderate-sized vessel giving rise to 3 relatively small diagonal      branches.  The apical LAD is a small vessel, though without any evidence      of atherosclerotic disease.  5.  Circumflex:  Large vessel giving rise to 2 obtuse marginals.  It is      angiographically  normal.  6.  RCA:  Large, dominant vessel.  There is a 50% stenosis of the proximal      vessel and a 30% stenosis of the mid-vessel.   IMPRESSION/RECOMMENDATION:  The patient has mild nonobstructive coronary  disease with preserved left ventricular systolic function.  I think her  exercise test is falsely positive, as her coronary disease does not fit the  territory of abnormality suggested by the exercise test.  We will plan on  medical therapy for moderate coronary disease.       WED/MEDQ  D:  06/12/2004  T:  06/12/2004  Job:  [id number]   cc:   Angus G. Renard Matter, MD  [address]  [place]  [place] [address]  Fax: [phone]   Vida Roller, M.D.  Fax: [phone]

## 2010-12-20 NOTE — Assessment & Plan Note (Signed)
MEDICAL RECORD NUMBER:  [id number]   DATE OF BIRTH:  [birth year]   DATE OF SERVICE:  November 22, 2004   INTERVAL HISTORY:  Ms. [name] returns today, she had a right L5  transforaminal epidural steroid injection under fluoroscopic guidance on  August 26, 2004, she had a reduction of her right lower extremity pain  after the procedure, her pre-procedure pain level was 7/10 and went to 0/10  post procedure, there was good fluoroscopic dye spread in the appropriate  location.  She states that she does feel that she has been able to do more  of her housework but not as much of her yard work as she would like.  She  has pain across her low back and mainly left hip area and has numbness in  bilateral feet.  Her pain is on and off and not consistent and she cannot  really rate it.  She is driving.  She does not climb steps.  Fourteen-point  review of systems done.  She reports head CT at [facility] but I am  not sure of the time frame of this.   EXAMINATION:  She is oriented to person, place and time.   VITALS:  Blood pressure 115/70, pulse 93, respiratory rate 16, O2 saturation  98% room air.   GENERAL:  No acute distress.  She has a tremor, appears to be a pill-rolling  type, she did tell me that she was previously diagnosed with Parkinson's but  now she is told that she has a different type of tremor.   Her gait is without evidence of toe drag or knee instability, her back has  no tenderness to palpation, she has fair range of motion at the hips.  She  has full range of motion in the knees and ankles.  She is dermatome  otherwise intact, she has no skin breakdown in her feet, no dystrophic  changes.   IMPRESSION:  History of right L5 radiculopathy improved after right  transforaminal epidural steroid injection 3 months ago.  Based on continued  efficacy, I do not recommend reinjection at this time.   She will follow up with Dr. Gerilyn Pilgrim and based on recurrence of pain  we will  re-inject.      AEK/MedQ  D:  11/22/2004 12:28:41  T:  11/22/2004 23:03:01  Job #:  [id number]   cc:   Kofi A. Gerilyn Pilgrim, M.D.  [address] [place]  [place]  [place] [address]  Fax: [phone]   Vernona Rieger, M.D.

## 2010-12-20 NOTE — Assessment & Plan Note (Signed)
[facility]                         [place] CARDIOLOGY OFFICE NOTE   CIELA, MAHAJAN                         MRN:          [id number]  DATE:03/05/2006                            DOB:          12-16-1935   Mrs. Exton returns.  This is a lady whom we have been puzzling our way  through her dyspnea and smothering sensation.  She had an adenosine Myoview  back in January that looked pretty reassuring.  She had some breast  attenuation, but her LV systolic function was normal with no ischemia or  scar.  She subsequently had a heart catheterization on July 3 which showed  nonobstructive disease in her right coronary artery, normal LV systolic  function, 2+ MR.  We followed that up with an echocardiogram on July 20  which showed normal LV systolic function, moderate concentric left  ventricular hypertrophy, mild AR and MR with no wall motion abnormalities.  We have done a relatively thorough evaluation on her.   She is on the following medications:  Lexapro 10 mg once a day, folic acid 1  mg once a day, aspirin 81 mg once a day, Nexium 40 mg once a day.  She took  supplemental vitamin E, B12 and calcium once a day.  She also takes a  multivitamin once a day.  From what we can tell, she is taking atenolol 50  mg once a day, isosorbide mononitrate 15 mg once a day, Crestor we think 5  mg once a day, and hydrochlorothiazide 25 mg once a day.  She brings in some  of her medications but not all of them.  When I go through her medications  with her, she says that she is taking those medications, but I cannot really  clarify exactly what is going on as far as whether she is actually on these  medications.   Today on physical exam, she is 170 pounds.  Her blood pressure is 132/92 in  both of her arms.  Her pulse is 60 and regular.  Her chest is clear.  She  has no jugular venous distention or carotid bruits.  Her cardiovascular exam  is regular.  There is a  2/6 holosystolic murmur at the apex.  Lower  extremities are without edema.   Mrs. Gubbels dyspnea is likely a combination of her COPD but possibly also  some diastolic heart failure.  Her diastolic blood pressure is not well  controlled, and quite frankly I am not certain if we need to increase her  medications or not.  What I have asked her to do is bring in her blood  pressure medications on multiple times and we can review them, and we always  kind of get several different medications each time she comes.  My sense of  things is that we should probably focus our attention on her medical therapy  to try to improve her blood pressure control somewhat and hopefully this  will improve things assuming that she has a diastolic component to her heart  failure.  Beyond that, she does need to have her  cholesterol evaluated  because of her  nonobstructive coronary disease.  She desires to see my colleague, Dr. Simona Huh, in followup, so she is going to arrange for that once we have her  come back in six months.                                   Farris Has. Dorethea Clan, MD  JMH/MedQ  DD:  03/05/2006  DT:  03/05/2006  Job #:  [id number]   cc:   Catalina Pizza, MD

## 2010-12-20 NOTE — Assessment & Plan Note (Signed)
[facility]                            CARDIOLOGY OFFICE NOTE   [name], [name]                         MRN:          [id number]  DATE:09/24/2006                            DOB:          1935-08-22    CARDIOLOGIST:  Jonelle Sidle, M.D.   PRIMARY CARE PHYSICIAN:  Angus G. Renard Matter, M.D.   HISTORY OF PRESENT ILLNESS:  [name] is a 75 year old female patient  previously followed by Dr. Dorethea Clan, who established with Dr. Diona Browner in  September 2007.  She has chronic chest pain and shortness of breath.  She has been evaluated extensively.  She underwent an echocardiogram in  July 2007 that showed an EF of 70-75% and only mild mitral  regurgitation.  She had had a catheterization in 2004 and then a repeat  catheterization by Dr. Diona Browner in 2007.  The catheterization in 2007  revealed nonobstructive coronary disease.  This did not explain her  chest pain or shortness of breath.  Last time Dr. Diona Browner saw her, he  asked her to see a pulmonologist.  The patient presents back for  followup today.  She tells me that she saw Dr. Juanetta Gosling in [place].  It sounds like she had PFTs done.  She says that he told her to stop  taking all of her inhalers because they were not doing her any good and  referred her to ENT.  She says she saw the ENT and was told that nothing  was wrong and that it could be from her heart.  She continues to note  shortness of breath.  She is somewhat of a difficult historian.  It  sounds as though she feels dyspneic most of the time.  She says that  when she does activity she gives out.  She also has intermittent chest  pain.  This is felt in the left arm at times.  Again, it is very hard to  tell from her whether or not this is getting worse.  The best I can  tell, it has been about the same as it has been since we have been  seeing her, and there has been no significant change over the last six  months.  She does sometimes  awaken in the middle of the night short of  breath.  She sleeps at an incline secondary to acid reflux disease.  She  denies any pedal edema.  She denies any significant cough, but does  occasionally have hemoptysis.  She did undergo CT angiogram of the chest  in December 2007.  This was essentially normal. Specifically, there was  no evidence of pulmonary embolus or mass.   CURRENT MEDICATIONS:  1. Diovan/HCT 160/25 mg a day.  2. Nexium 40 mg b.i.d.  3. Aspirin 81 mg daily.  4. Clonazepam p.r.n.  5. Folic acid 400 mcg 2 tablets daily.  6. B12 1000 mcg daily.  7. Primidone 250 mg daily in the morning, 1/2 tablet at noon.  8. Oyster shell calcium.  9. Isosorbide 30 mg daily.  10.Atenolol 50 mg daily.  11.Multivitamin.  12.Vitamin E.  13.Ibuprofen p.r.n.  14.Phenergan p.r.n.  15.Tylenol p.r.n.   ALLERGIES:  SULFA.   SOCIAL HISTORY:  She is an ex-smoker.  She has about a 30 pack-year  history.   PHYSICAL EXAMINATION:  GENERAL:  She is a well-nourished, well-developed  female in no acute distress.  VITAL SIGNS:  Blood pressure is 170/83, pulse 61, weight 171 pounds.  Repeat blood pressure by me on the left is 170/82, on the right 160/88.  HEENT:  Unremarkable.  NECK:  No JVD.  CARDIAC:  S1, S2.  Regular rate and rhythm.  LUNGS:  Clear to auscultation bilaterally, without wheezing, rhonchi, or  rales.  ABDOMEN:  Soft, nontender,  EXTREMITIES:  Without edema.  NEUROLOGIC:  She is alert and oriented x3.  Cranial nerves II-XII  grossly intact.   Electrocardiogram reveals sinus rhythm, with a heart rate of 60, normal  axis, no acute changes.   IMPRESSION:  1. Atypical chest pain.  2. Dyspnea.  3. Nonobstructive coronary disease by catheterization in July 2007.  4. Preserved left ventricular function.  5. Questionable history of chronic obstructive pulmonary disease.  6. Gastroesophageal reflux disease.  7. Hypertension, poorly controlled.  8. Hyperlipidemia, untreated at  this time.  9. History of tremors.  Benign essential tremors per the records.   PLAN:  The patient presents to the office today for a 37-month followup.  She was also interviewed and examined by Dr. Diona Browner.  She continues to  have chronic intermittent chest pain and shortness of breath.  She  apparently saw Dr. Juanetta Gosling in [place] and had PFTs.  We will try to  obtain that report as well as his notes.  She says that he took her off  of her inhalers.  She needs aggressive risk factor modification.  She  should continue her aspirin and beta blocker.  We have also recommended  that she start a statin.  I have placed her on Lipitor 40 mg a day.  By  our records from June 2007, her LDL was 151.  She will need lipids and  LFTs in 6 weeks.  She did have a recent CT angiogram that was negative  for pulmonary embolus.  There was a tiny hiatal hernia.  There was also  a question of pulmonary arterial hypertension.  Therefore, we will set  her up for an echocardiogram to reevaluate her LV function as well as to  assess her pulmonary artery pressures.  We did do ambulating oximetry  testing today in the  office.  Her saturations remained in the 95% range with ambulation.  She  will return in followup with Dr. Diona Browner in the next one month.      Tereso Newcomer, PA-C  Electronically Signed      Jonelle Sidle, MD  Electronically Signed   SW/MedQ  DD: 09/24/2006  DT: 09/24/2006  Job #: [id number]   cc:   Angus G. Renard Matter, MD

## 2010-12-20 NOTE — Procedures (Signed)
NAME:  [name], [name]                  ACCOUNT NO.:  [id number]   MEDICAL RECORD NO.:  [id number]          PATIENT TYPE:  INP   LOCATION:  IC10                          FACILITY:  [facility]   PHYSICIAN:  Vida Roller, M.D.   DATE OF BIRTH:  Mar 26, 1936   DATE OF PROCEDURE:  DATE OF DISCHARGE:                                  ECHOCARDIOGRAM   PRIMARY CARE PHYSICIAN:  Dr. Ishmael Holter. McInnis.   TAPE NUMBER:  [id number], tape count [id number] through [id number].   INDICATION:  A 75 year old female with chest pain.  Previous echocardiogram  from 2003 shows normal left ventricular systolic function.  That is a stress  echocardiogram.   TECHNICAL QUALITY:  Adequate.   M-MODE TRACINGS:  1.  The aorta is 24 mm.  2.  The left atrium is 43 mm.  3.  The septum is 12 mm.  4.  Posterior wall is 12 mm.  5.  Left ventricular diastolic dimension is 44 mm.  6.  Left ventricular systolic dimension is 32 mm.   2-D AND DOPPLER IMAGING:  1.  The left ventricle is normal size with normal systolic function.  There      are no wall-motion abnormalities seen.  The overall ejection fraction is      60-65%.  2.  The right ventricle is normal size with normal systolic function.  3.  Both atria appear to be enlarged.  4.  The aortic valve is sclerotic with trace insufficiency.  No stenosis is      seen.  5.  The mitral valve has mild annular calcification with mild regurgitation.      No stenosis is seen.  6.  The tricuspid valve has mild regurgitation.  7.  The pulmonic valve is not well seen.  8.  The pericardial structures are without effusion.  9.  The inferior vena cava appears to be normal size.  10. The ascending aorta is not well seen.     Trey Paula   JH/MEDQ  D:  06/11/2004  T:  06/11/2004  Job:  [id number]

## 2010-12-20 NOTE — Op Note (Signed)
[name], GREFF                  ACCOUNT NO.:  [id number]   MEDICAL RECORD NO.:  [id number]          PATIENT TYPE:  AMB   LOCATION:  DAY                           FACILITY:  [facility]   PHYSICIAN:  Lionel December, M.D.    DATE OF BIRTH:  [birth year]   DATE OF PROCEDURE:  01/20/2005  DATE OF DISCHARGE:                                 OPERATIVE REPORT   PROCEDURE:  Esophagogastroduodenoscopy with esophageal dilation.   INDICATIONS:  Bryer is a 75 year old Caucasian female who presents with  recurrent solid food dysphagia. She has chronic GERD and is maintained on  PPI. She has history of esophageal ring and has undergone dilations in the  past. Procedure risks were reviewed with the patient, and informed consent  was obtained.   PREMEDICATION:  Cetacaine spray for pharyngeal topical anesthesia, Demerol  50 mg IV, Versed 6 mg IV in divided dose.   FINDINGS:  Procedure performed in endoscopy suite. The patient's vital signs  and O2 saturation were monitored during the procedure and remained stable.  The patient was placed in left lateral position. Olympus videoscope was  passed via oropharynx without any difficulty into esophagus.   Esophagus. Mucosa of the esophagus was normal. Squamocolumnar junction was  at 35 cm from the incisors, and there was noncritical wide open ring. Hiatus  was at 38 cm from the incisors.   Stomach. It was empty and distended very well insufflation. Folds of  proximal stomach were normal. Examination mucosa revealed multiple petechiae  at antrum and a single erosion. Please note that the patient has been  treated for H pylori gastritis in the past. Pyloric channel was patent.  Angularis, fundus and cardia were examined by retroflexing the scope were  normal.   Duodenum. Bulbar mucosa was normal. Scope was passed to the second part of  duodenum where mucosa and folds were normal. Endoscope was withdrawn.   Esophagus was dilated by passing 58-French Maloney  dilator to full  insertion. As the dilator was withdrawn, endoscope was passed again, and  there was a boat-shaped tear of cervical esophagus indicative of web. There  was no mucosal disruption distally. Endoscope was withdrawn. The patient  tolerated the procedure well.   FINAL DIAGNOSIS:  Small sliding hiatal hernia with noncritical distal  esophageal ring.   Antral gastritis, possibly related to ASA. She has been treated for H pylori  infection in the past.   Esophagus dilated by passing 58-French Maloney dilator resulting in mucosal  disruption at cervical esophagus indicative of web.   RECOMMENDATIONS:  She will continue her usual medications and antireflux  measures. She will call the office with a progress report next week.       NR/MEDQ  D:  01/20/2005  T:  01/21/2005  Job:  [id number]   cc:   Angus G. Renard Matter, MD  [address]  [place]  [place] [address]  Fax: [phone]

## 2010-12-20 NOTE — Op Note (Signed)
[name], [name]                  ACCOUNT NO.:  [id number]   MEDICAL RECORD NO.:  [id number]          PATIENT TYPE:  AMB   LOCATION:  DAY                           FACILITY:  [facility]   PHYSICIAN:  Lionel December, M.D.    DATE OF BIRTH:  07-01-1936   DATE OF PROCEDURE:  04/07/2006  DATE OF DISCHARGE:                                 OPERATIVE REPORT   PROCEDURE:  Esophagogastroduodenoscopy.   INDICATIONS:  [name] is a 75 year old Caucasian female with chronic GERD who  presents with upper abdominal pain, postprandial bloating, nausea and  vomiting.  CBC, LFTs and amylase are normal.  She has been on double dose  PPI without symptomatic improvement.  She is undergoing diagnostic EGD.  Procedure was reviewed with the patient and informed consent was obtained.   MEDICATIONS FOR CONSCIOUS SEDATION:  Benzocaine spray for pharyngeal topical  anesthesia, Demerol 50 mg IV and Versed 5 mg IV.   FINDINGS:  Procedure performed in endoscopy suite.  The patient's vital  signs and O2 sat were monitored during procedure and remained stable.  The  patient was placed in left lateral recumbent position and Olympus videoscope  was passed via oropharynx without any difficulty into esophagus.   Esophagus:  Mucosa of the esophagus normal.  GE junction was at 38 cm and  hiatus at 40.   Stomach:  It was empty and distended very well insufflation.  Folds of  proximal stomach were normal.  Examination of mucosa revealed some antral  erythema but no erosions or ulcers noted.  Pyloric channel was patent.  Angularis, fundus and cardia were examined by retroflexing the scope and  were normal.   Duodenum:  Bulbar mucosa was normal.  Scope was passed in second part of the  duodenum where mucosa and folds were normal.  Endoscope was withdrawn.  The  patient tolerated the procedure well.   FINAL DIAGNOSES:  1. Small sliding hiatal hernia.  2. Nonerosive antral gastritis.  Please note that she is been treated for   Helicobacter pylori gastritis in the past, however, eradication has not      been documented.   Suspect the patient's symptoms are due to gastroesophageal reflux disease  and dyspepsia.  If she remains symptomatic, will consider solid phase  gastric emptying study.   RECOMMENDATIONS:  1. She will continue antireflux measures and PPI as before.  2. Six small meals.  3. The patient asked to keep symptom diary as to frequency of vomiting      episodes.  She will call us with a progress report in 2-3 weeks.      Lionel December, M.D.  Electronically Signed     NR/MEDQ  D:  04/07/2006  T:  04/07/2006  Job:  [id number]   cc:   Angus G. Renard Matter, MD  Fax: [phone]

## 2010-12-20 NOTE — Op Note (Signed)
NAME:  [name], [name]                            ACCOUNT NO.:  [id number]   MEDICAL RECORD NO.:  [id number]                   PATIENT TYPE:  AMB   LOCATION:  DAY                                  FACILITY:  [facility]   PHYSICIAN:  Lionel December, M.D.                 DATE OF BIRTH:  [birth year]   DATE OF PROCEDURE:  05/10/2003  DATE OF DISCHARGE:                                 OPERATIVE REPORT   PROCEDURE:  Esophagogastroduodenoscopy with esophageal dilatation.   ENDOSCOPIST:  Lionel December, M.D.   INDICATIONS:  [name] is a 75 year old Caucasian female with multiple medical  problems.  She also has a chronic GERD and now presents with dysphagia.  Her  last EGD/ED was in 2003.  The procedure and risks were reviewed with the  patient and informed consent was obtained.   PREOPERATIVE MEDICATIONS:  Cetacaine spray for oropharyngeal topical  anesthesia, Demerol 50 mg IV and Versed 10 mg IV in divided dose.   FINDINGS:  Procedure performed in endoscopy suite.  The patient's vital  signs and O2 saturation were monitored during the procedure and remained  stable.  The patient was placed in the left lateral recumbent position and  Olympus videoscope was passed via the oropharynx without any difficulty into  the esophagus.   ESOPHAGUS:  Mucosa of the esophagus was normal.  She had a ring at the GE  junction which is noncritical.  There was a 3-to-4-cm, small size, sliding  hiatal hernia.   STOMACH:  It was empty and distended very well with insufflation.  The folds  of the proximal stomach were normal.  Examination of the mucosa revealed a  few petechiae and antral erythema.  Please note that the patient has  previously has been treated for H. pylori gastritis.  The pyloric channel  was patent.  The angularis, fundus, and cardia were examined by retroflexing  the scope and were normal.   DUODENUM:  Examination of the bulb and postbulbar duodenum was normal.   The endoscope was withdrawn.  The  esophagus was dilated by passing 58 [place]  Maloney dilator.  While the ring was still intact, passage of this dilator  resulted in a linear, boat-shape tear in the cervical esophagus  indicated a  small web and/or tubular area.  The ring was subsequently disrupted with  focal biopsy.  No tissue specimen was saved.   The endoscope was withdrawn.  The patient tolerated the procedure well.   FINAL DIAGNOSES:  1. Noncritical distal esophageal ring and a small sliding hiatal hernia.  2. Nonerosive gastritis.  3. Esophagus dilated by passing 76 [place] given history of dysphagia which     resulted in mucosal tear in the     cervical esophagus indicating a web or tubular narrowing.  The ring was     still intact and disrupted with focal biopsy.   RECOMMENDATIONS:  She  should continue her antireflux measures, as before,  and should take Nexium daily.  l      ___________________________________________                                            Lionel December, M.D.   NR/MEDQ  D:  05/10/2003  T:  05/10/2003  Job:  [id number]   cc:   Kirstie Peri, MD  [address]  [place] [address]  Fax: [phone]   Dr. Sherryll Burger

## 2010-12-20 NOTE — Procedures (Signed)
   NAME:  [name], [name] NO.:  [id number]   MEDICAL RECORD NO.:  [id number]                   PATIENT TYPE:  OUT   LOCATION:  RAD                                  FACILITY:  [facility]   PHYSICIAN:  [place] Bing, M.D. [facility]           DATE OF BIRTH:  1936-01-16   DATE OF PROCEDURE:  05/25/2002  DATE OF DISCHARGE:                                  ECHOCARDIOGRAM   CLINICAL DATA:  A 75 year old woman with multiple cardiovascular risk  factors and chest pain.   1. Progressive dobutamine infusion resulted in a peak heart rate of 155, 99%     of age-predicted maximum.  The patient reported back discomfort during     the study--no chest pain nor dyspnea noted.  2. There was a substantial increase in blood pressure from an initially     hypertensive level of 170/110 to  220/110 with low-dose dobutamine.  With     the peak dose infused of 30 mcg/kg per minute, blood pressure returned to     normal.  3. No significant arrhythmias--a few PAC's and PVC's noted.  4. Baseline EKG:  Normal sinus rhythm; nondiagnostic inferior Q waves;     minimal nonspecific ST segment abnormality.  During drug infusion, there     was insignificant upsloping ST segment depression.  Quality of the EKG     tracings was impaired by the patient's movement disorder.  5. Baseline echocardiogram:  Image quality was suboptimal but adequate.     There was mild aortic valvular sclerosis; the mitral valve was normal.     Normal left atrium, right atrium, and right ventricle. Mild mitral     annular calcification.  Left ventricular size and function were normal.  6. Echocardiographic imaging immediately post-exercise:  Normal increase in     contractility at all myocardial segments.   IMPRESSION:  Negative pharmacologic stress echocardiogram.                                               [place] Bing, M.D. [facility]    RR/MEDQ  D:  05/26/2002  T:  05/26/2002  Job:  [id number]

## 2010-12-20 NOTE — Procedures (Signed)
NAME:  [name], [name] NO.:  [id number]   MEDICAL RECORD NO.:  [id number]          PATIENT TYPE:  OUT   LOCATION:  RAD                           FACILITY:  [facility]   PHYSICIAN:  Vida Roller, M.D.   DATE OF BIRTH:  [birth year]   DATE OF PROCEDURE:  02/20/2006  DATE OF DISCHARGE:                                  ECHOCARDIOGRAM   PRIMARY:  Dr. Monica Becton NUMBER:  [id number]   TAPE COUNT:  [id number]   This is a 75 year old woman for reevaluation of mitral regurgitation.  Previous echocardiogram in November of 2005 shows mild mitral regurgitation,  normal LV systolic function, biatrial enlargement.  Today's study is  technically adequate.   M-MODE TRACINGS:  The aorta is 34 mm.   Left atrium is 48 mm.   Septum is 18 mm.   Posterior wall 17 mm.   Left ventricular diastolic dimension is 45 mm.   Left ventricular systolic dimension 27 mm.   2-D AND DOPPLER IMAGING:  The left ventricle is normal size.  There is  vigorous LV systolic function with an estimated ejection fraction of 70-75%.  There were no wall motion abnormalities seen.  There was moderate concentric  left ventricular hypertrophy.   Right ventricle is normal size with normal systolic function.   Both atria appear to be normal size.   The aortic valve is sclerotic with no stenosis.  There is mild regurgitation  seen.   The mitral valve has moderate annular calcification with mild regurgitation.  No stenosis is seen.   Tricuspid valve with mild regurgitation.   There is no pericardial effusion.   ASSESSMENT:  No significant change from previous echocardiogram.      Vida Roller, M.D.  Electronically Signed     JH/MEDQ  D:  02/20/2006  T:  02/20/2006  Job:  [id number]

## 2010-12-20 NOTE — Procedures (Signed)
NAME:  [name], [name] NO.:  [id number]   MEDICAL RECORD NO.:  [id number]          PATIENT TYPE:  INP   LOCATION:  IC10                          FACILITY:  [facility]   PHYSICIAN:  Vida Roller, M.D.   DATE OF BIRTH:  1935/08/19   DATE OF PROCEDURE:  06/11/2004  DATE OF DISCHARGE:                                    STRESS TEST   HISTORY:  This is a pleasant 74 year old female who was admitted to [facility] on June 10, 2004, from Dr. Renard Matter' office for evaluation  of chest discomfort.  The patient was seen in consultation by Dr. Dorethea Clan on  the day of admission.  Cardiac enzymes were found to be negative.  She was  set up for a Cardiolite as well as an echocardiogram.   Initially, it was felt that the patient could have an exercise Cardiolite,  however, she has some difficulty walking and she had received a beta blocker  on the day of the study, so a decision was made to switch her to an  Adenosine.  The patient does have a history of chronic obstructive pulmonary  disease, however, she did not have any wheezing on physical examination.  She is not on inhalers chronically, although she does use them on a p.r.n.  basis.  She was treated with an albuterol hand held nebulizer treatment  prior to the study and she was kept on oxygen throughout the study.  She had  some baseline shortness of breath prior to starting.  Her EKG shows sinus  rhythm, rate 60 beats per minute without ischemic changes.   Adenosine was administered minutes 1 through 4, Myoview was given at 3  minutes.  The patient did have some dyspnea and some chest discomfort.  Her  EKG did not show any ischemic changes.  The symptoms resolved in recovery.  She did not have any wheezing on physical examination, either prior to or  throughout the study.  The images are pending at time of this dictation.     Markus.Osmond   DR/MEDQ  D:  06/11/2004  T:  06/11/2004  Job:  [id number]   cc:   Angus G. Renard Matter,  MD  [address]  [place]  [place] [address]  Fax: [phone]

## 2010-12-20 NOTE — H&P (Signed)
NAME:  AHJANAE, CASSEL                  ACCOUNT NO.:  [id number]   MEDICAL RECORD NO.:  [id number]          PATIENT TYPE:  AMB   LOCATION:                                FACILITY:  [facility]   PHYSICIAN:  Lionel December, M.D.    DATE OF BIRTH:  [birth year]   DATE OF ADMISSION:  DATE OF DISCHARGE:  LH                                HISTORY & PHYSICAL   PRESENTING COMPLAINT:  Dysphagia to solids.   Kalsey is a 75 year old Caucasian female patient of Dr. Lorenso Courier who presents  with recurrent dysphagia which she has had for several months.  She says she  is unable to swallow meat or other solids and she just is on a soft diet.  She says that she is getting weak and feeble.  Review of her chart reveals  that she has only lost a pound and a half in the last four months.  She  rarely experiences heartburn as long as she takes the medicine.  She also  complains of post prandial regurgitation, particularly at night if she eats  late. She denies nausea, vomiting, odynophagia, chronic cough, hoarseness,  melena or rectal bleeding.  She remains with irregular bowel movements and  intermittent pain on the right side of her abdomen.  She has been diagnosed  with IBS.  She just had a colonoscopy in March 2006 which was normal other  than external hemorrhoids.  She continues to complain of feeling nervous and  shaky.  She also complains bitterly about her neck pain and back pain.  She  gives me the impression that she is not getting answers to her questions  regarding her back pain.  She wanted to me to look at the result of her  report of her MRI which she has.  It shows that she has significant DJD and  disk disease without spinal stenosis.  Prior MRI of her back also reveals  significant DJD.   The patient's last EGD was in October 2004 revealing noncritical ring, small  sliding hiatal hernia.  Esophagus was dilated by passing a 58-French Maloney  dilator resulting in a small tear in the cervical  esophagus.   She had a barium pill study on December 2005 which revealed a small sliding  hiatal hernia and impaired secondary esophageal peristalsis.  She had some  difficulty initiating swallow with the barium pill, but once it was in her  proximal esophagus, it readily passed into her esophagus.   CURRENT MEDICATIONS:  1.  Ativan 1 mg t.i.d. p.r.n.  2.  MVI daily.  3.  ASA 81 mg daily.  4.  Vitamin E 200 units daily.  5.  B-12 injection once a month.  6.  Nexium 40 mg q.a.m.  7.  Folic acid 1 daily.  8.  Calcium with vitamin D 600 mg daily.  9.  Diovan 160 mg daily.   PAST MEDICAL HISTORY:  Chronic GERD.  She has had dysphagia for many years.  She also is felt to have nonspecific esophageal motility disorder.  She had  EM at [facility] and  seen October 1995 revealing 50% nontransmitted swallows and  50% were peristaltic.  Her LES pressure was 24 mmHg with satisfactory  relaxation.  Adalena has undergone multiple dilations in the past.  The last one  was in October 2004 as above.  She states it only helps her for six months.   She has IBS, anxiety neurosis, hyperlipidemia, B-12 deficiency.  Her H.  Pylori gastritis has been treated in the past.  She had normal gastric  emptying study in October 1996.  She has had cholecystectomy.  She has DJD  of her cervical as well as lumber spine with chronic back pain.  She has  some tremors to her upper extremities and head.  She was diagnosed with  Parkinson's disease and actually had been on therapy, but this was  subsequently ruled out and evaluation at tertiary center.   ALLERGIES:  SULFA.   FAMILY HISTORY:  Positive for colon carcinoma in her mother.  Brother died  of brain tumor.  She lost one brother and sister of CAD.   SOCIAL HISTORY:  She is retired.  She does not smoke cigarettes or drinks  alcohol.   OBJECTIVE:  VITAL SIGNS:  Weight 170.5.  She is 66-1/2 inches tall.  Pulse  72 per minute, blood pressure 122/80, temperature is 98.   GENERAL:  She has prominent tremors to her head neck and upper extremities.  HEENT:  Conjunctivae is pink/nonicteric.  Oropharyngeal mucosa is dry and  she is edentulous.  NECK:  No neck masses are noted.  CARDIAC:  Regular rhythm.  Normal S1.  No murmur or gallop noted.  LUNGS:  Clear to auscultation.  ABDOMEN:  Symmetrical.  Bowel sounds are normal.  Palpation reveals soft  abdomen.  Mild tenderness of the right midabdomen, but no organomegaly or  masses noted.  EXTREMITIES:  She does not have clubbing or peripheral edema.   ASSESSMENT:  Iyana is a 75 year old Caucasian female who presents with solid  food dysphagia.  Her heartburn is well controlled with therapy.  She has  nonspecific esophageal motility disorder.  She has undergone multiple  dilations in the past.  The last one was in 10/04 providing relief for six  months.  I suspect her dysphagia is primarily due to esophageal motility  disorder, but she could have relapse of her ring and/or esophageal valve.  Options reviewed with the patient which include repeating barium study or  esophageal manometry, but she would rather have her esophagus dilated since  it has helped in the past.   RECOMMENDATIONS:  1.  She will continue antireflux measures and Nexium as before.  2.  Esophagogastroduodenoscopy with esophageal dilation to be performed at      [facility] in the near future.  She is to hold off her ASA for two days prior      to the procedure.       NR/MEDQ  D:  01/09/2005  T:  01/09/2005  Job:  [id number]   cc:   Angus G. Renard Matter, MD  [address]  [place]  [place] [address]  Fax: [phone]

## 2010-12-20 NOTE — Discharge Summary (Signed)
NAME:  [name], [name] NO.:  [id number]   MEDICAL RECORD NO.:  [id number]          PATIENT TYPE:  INP   LOCATION:  3731                         FACILITY:  [facility]   PHYSICIAN:  Salvadore Farber, M.D. LHCDATE OF BIRTH:  1936-07-24   DATE OF ADMISSION:  06/11/2004  DATE OF DISCHARGE:                           DISCHARGE SUMMARY - REFERRING   DISCHARGE DIAGNOSES:  1.  Nonobstructive coronary artery disease.  2.  Normal ejection fraction.  3.  Hypertension, under poor control.  4.  History of hyperlipidemia.  5.  Chronic obstructive pulmonary disease.  6.  Benign essential tremor.  7.  Gastroesophageal reflux disease, status post esophageal stricture      treatment.  8.  Urinary tract infection, treated.   HOSPITAL COURSE:  The patient was initially seen by Vida Roller, M.D. on  June 10, 2004, at [facility].  Because of her multiple  cardiac risk factors and her chest pain, she was transferred to [facility] for cardiac catheterization.  She underwent cardiac catheterization  on June 12, 2004, and was found to have nonobstructive coronary artery  disease that was treated medically.  But more importantly, we felt that her  hypertension needed to be aggressively treated.  We have added several new  medications and I will have her follow up in the office on June 26, 2004, at 1:30 p.m. for blood pressure follow-up.   In addition, she did have a perfusion study on June 11, 2004, that did  show small reversible defect in the anterior wall to the apex just for  future reference.  At this point, her labs show a hemoglobin of 12.6,  hematocrit 36.6, platelets 208, white count 5.4.  Her potassium is 3.5, BUN  12, creatinine 0.5.  Pending is her lipid profile that needs to be reviewed  at her visit.  There is a mention that she is on Lipitor at [facility], but it is unclear to me whether or not she is on that at  home.  Because of her extensive cardiac risk factors and nonobstructive  disease, I will go ahead and make sure that she is on Lipitor 10 mg a day  and she will need to have this lipid panel reviewed at her visit and then to  have another one done in approximately six to eight weeks.   DISCHARGE MEDICATIONS:  1.  Primidone.  2.  Nexium.  3.  Baby aspirin.  4.  Folic acid.  5.  B12.  6.  Vitamins.  7.  Zoloft.  8.  Robaxin.  9.  Ativan as needed.  10. Norvasc 5 mg a day.  11. Lotensin 20 mg a day.  12. Lopressor 25 mg b.i.d.  13. Lipitor 10 mg a day.  14. She may use Tylenol if needed.   ACTIVITY:  No straining or lifting over 10 pounds for one week.   DIET:  Remain on a low fat diet.   WOUND CARE:  Clean her catheterization site with soap and water, no  scrubbing.  FOLLOW UP:  Call [phone] with questions or concerns.  Follow up on June 25, 2004, at 1:30 p.m. at Dr. Marchelle Folks office at [facility].       LB/MEDQ  D:  06/12/2004  T:  06/12/2004  Job:  [id number]   cc:   Vida Roller, M.D.  Fax: [phone]   Angus G. Renard Matter, MD  [address]  [place]  [place] [address]  Fax: [phone]

## 2010-12-20 NOTE — Group Therapy Note (Signed)
NAME:  [name], [name]                  ACCOUNT NO.:  [id number]   MEDICAL RECORD NO.:  [id number]          PATIENT TYPE:  INP   LOCATION:  IC10                          FACILITY:  [facility]   PHYSICIAN:  Angus G. Renard Matter, MD   DATE OF BIRTH:  [birth year]   DATE OF PROCEDURE:  DATE OF DISCHARGE:                                   PROGRESS NOTE   SUBJECTIVE:  This patient was moved to ICU because of continued anterior  chest pain and was placed on nitroglycerin drip.  Also was placed on  Lovenox.  Her pain has been relieved with a nitroglycerin drip.   PHYSICAL EXAMINATION:  VITAL SIGNS:  Blood pressure 158/88, respirations 17,  pulse 62, temperature 98.7.  LUNGS:  Clear to percussion and auscultation.  HEART:  Regular rhythm.  CHEST:  The patient was admitted with chest pain rule out, ischemic heart  disease and coronary syndrome.   LABORATORY DATA:  Her cardiac enzymes most recent CPK 3, CPK-MB 1.9,  troponin less than 0.01.   PLAN:  Proceed with Cardiolite stress test, 2D echo.     Angu   AGM/MEDQ  D:  06/11/2004  T:  06/11/2004  Job:  [id number]

## 2010-12-20 NOTE — Assessment & Plan Note (Signed)
[facility]                         [place] [facility] OFFICE NOTE   KHANH, CORDNER                         MRN:          [id number]  DATE:02/19/2006                            DOB:          [birth year]    HISTORY OF PRESENT ILLNESS:  Mrs. Depaul returns, this lady with  nonobstructive coronary disease, valvular heart disease and hypertension and  hyperlipidemia, COPD who we recently had heart catheterization on July 3  with the care of Dr. Diona Browner that showed basically nonobstructive coronary  disease, 2+ mitral regurgitation, normal LV systolic function.  The feeling  was that she likely does not have her discomfort coming from her heart.  The  mitral regurgitation appears to be similar to that found on an  echocardiogram back in 2005.  She has had a recent perfusion study which did  not show any significant ischemia or scar.  I suspect her coronary disease  is nonobstructive.   CURRENT MEDICATIONS:  Her medication has not changed from that previously  dictated with the exception of:  1.  Isosorbide mononitrate 30 mg once daily.  2.  Crestor 5 mg once daily.  3.  Hydrochlorothiazide 25 mg once daily.  These were all added due to high      blood pressure and nonobstructive coronary disease.   REVIEW OF SYSTEMS:  She is really only complaining of shortness of breath  and fatigue.  She has relatively infrequent discomfort in her chest.   PHYSICAL EXAMINATION:  VITAL SIGNS:  Her blood pressure is 132/78, pulse 66.  CHEST:  Clear to auscultation.  CARDIOVASCULAR:  Regular.  EXTREMITIES:  Lower extremity is without significant edema.  Her calf site  is well healed without ecchymosis or bruit.   IMPRESSION:  Overall, I think she is doing reasonably well.  I do want to  repeat her echocardiogram to evaluate to see if there is any significant  mitral regurgitation here and what we should do about that.  The other issue  is whether to do a  stress echo.   PLAN:  At this point, I will defer that until we get a sense for whether she  has reasonable windows.  I will see her back after her echocardiogram has  been completed.  We are going to get a B-met just to make sure her BUN and  creatinine are okay after the __________.                                   Gerrit Friends. Dietrich Pates, MD, [facility]  RMR/MedQ  DD:  02/19/2006  DT:  02/19/2006  Job #:  [id number]   cc:   Angus G. Renard Matter, MD

## 2010-12-20 NOTE — H&P (Signed)
NAME:  [name], [name]                  ACCOUNT NO.:  [id number]   MEDICAL RECORD NO.:  [id number]          PATIENT TYPE:  AMB   LOCATION:                                FACILITY:  [facility]   PHYSICIAN:  Lionel December, M.D.    DATE OF BIRTH:  17-May-1936   DATE OF ADMISSION:  DATE OF DISCHARGE:  LH                                HISTORY & PHYSICAL   PRESENTING COMPLAINT:  1. Nausea and vomiting.  2. Abdominal pain.   HISTORY OF PRESENT ILLNESS:  [name] is 75 year old Caucasian female patient of  Dr. Renard Matter' who is here for a scheduled visit.  She was last seen in May  this year for dysphagia.  She has undergone multiple dilations with  transient benefit.  We felt that her dysphagia is due to motility disorder  and we just opted to monitor.  She states her dysphagia is about the same,  no better, no worse.  She has been vomiting for one week.  Generally occurs  in the evening and she would throw up food that she has eaten that day.  She  also complains of nausea, upper and lower abdominal pain.  She denies  hematemesis, melena, or rectal bleeding.  She complains of bloating and  intermittent diarrhea.  Pain is described to be sharp and tightness.  She  also feels weak but this has been a chronic symptom.  She was recently  evaluated by Dr. Dorethea Clan and begun on Imdur.  Initially she a lot of headache  but this seemed to have eased off.  She was also begun on fluid medicine.  She has not had chest pain recently.  Review of the systems is negative for  fever or chills.   She is on ASA 81 mg daily, vitamin E 200 units daily, centrum Silver daily  but she does not take it daily, folic acid 1 mg daily, Klonopin 0.5 mg  t.i.d., Lexapro 10-20 mg daily, Nexium 40 mg b.i.d. (she recently doubled  the dose to see if it would help), B12 one daily, primidone 250 mg in a.m.  and half a pill at noon, atenolol 50 mg daily, Xanax 0.5 mg nightly, HCTZ 25  mg daily, Imdur 30 mg daily.   PAST MEDICAL HISTORY:   She has chronic GERD, history of her dysphagia which  she has experienced for many years.  This goes back to 1990s.  She had  esophageal manometry at [facility] and [facility].  She had 50% nonperistaltic  swallows.  Her LES pressure was normal with a satisfactory relaxation.  Most  recent EGD/ED was in June 2000 when she had empiric dilation of her  esophagus.  She has irritable bowel syndrome, anxiety, neurosis,  hyperlipidemia, B12 deficiency.  She has been treated for H. pylori  gastritis in the past.  She did have a gastric emptying study 11 years ago  which was normal.  She also has history of tremors.  At one time she was  felt to have Parkinson's disease and actually was treated for it but when  she went to  [facility] they felt that she did not have Parkinson's disease.  She  has had cholecystectomy as well as hysterectomy.   She has chronic low back pain.  She had multiple findings on MRI of lumbar  spine that she had in April 2005.  Her last colonoscopy was in March 2006.  She had small hemorrhoids.  Otherwise, normal exam.   ALLERGIES:  TRIAVIL and SULFA.   FAMILY HISTORY:  Positive for colon carcinoma in her mother.  She does not  remember the age at diagnosis.  One brother died of a brain tumor.  She lost  a brother and a sister of CAD.   SOCIAL HISTORY:  She is retired.  She does not smoke cigarettes or drink  alcohol.   OBJECTIVE:  VITAL SIGNS:  Weight 174 pounds.  She is 5 feet 6-1/2 inches  tall.  Pulse 64 per minute, blood pressure 142/80, temperature is 98.1.  GENERAL:  She has tremors to her hands and slight tremor to her head.  HEENT:  Conjunctivae is pink.  Sclerae is nonicteric.  Oropharyngeal mucosa  is normal.  She has upper dentures in place.  She is edentulous in lower  jaw.  She does have dentures but they do not fit and cause pain.  No neck  masses or thyromegaly noted.  CARDIAC:  Regular rhythm.  Normal S1, S1.  No murmur or gallop noted.  LUNGS:  Clear to  auscultation.  ABDOMEN:  Protuberant.  Bowel sounds are normal.  She has soft abdomen with  tenderness at epigastrium, mid abdomen as well as right lower quadrant.  No  organomegaly or masses.  EXTREMITIES:  No peripheral edema or clubbing noted.   She had abdominopelvic CT in August 2006 revealing sigmoid colon  diverticulosis, punctate calcifications over the liver felt to be due to  prior granulomatous inflammation.  She did not have any adenopathy.   ASSESSMENT:  [name] is 75 year old Caucasian female with chronic  gastroesophageal reflux disease, esophageal dysmotility, history of  irritable bowel syndrome who presents with one week history of nausea and  vomiting.  She doubled up on her Nexium without symptomatic improvement.  Last esophagogastroduodenoscopy was in June 2006 revealing small sliding  hiatal hernia.  She is on low-dose ASA.  It is possible that she just has  gastroparesis but we need to rule out peptic ulcer disease prior to doing  gastric emptying or other studies.   RECOMMENDATIONS:  1. She will have CBC, LFTs, and serum amylase.  2. Diagnostic esophagogastroduodenoscopy to be performed at [facility] in the      future.  If EGD is normal will proceed with solid phase gastric      emptying study.      Lionel December, M.D.  Electronically Signed     NR/MEDQ  D:  03/26/2006  T:  03/26/2006  Job:  [id number]   cc:   Angus G. Renard Matter, MD  Fax: [phone]

## 2010-12-20 NOTE — Procedures (Signed)
NAME:  [name], [name]                  ACCOUNT NO.:  [id number]   MEDICAL RECORD NO.:  [id number]          PATIENT TYPE:  OUT   LOCATION:  RESP                          FACILITY:  [facility]   PHYSICIAN:  Edward L. Juanetta Gosling, M.D.DATE OF BIRTH:  1936/01/28   DATE OF PROCEDURE:  DATE OF DISCHARGE:  06/12/2006                              PULMONARY FUNCTION TEST   1. Spirometry shows a mild ventilatory defect with evidence of airflow      obstruction.  2. Lung volumes are normal.  3. DLCO is normal.  4. There is no significant bronchodilator effect.      Edward L. Juanetta Gosling, M.D.  Electronically Signed     ELH/MEDQ  D:  06/16/2006  T:  06/17/2006  Job:  [id number]   cc:   Catalina Pizza, M.D.  Fax: [phone]

## 2010-12-20 NOTE — Assessment & Plan Note (Signed)
[facility]                              CARDIOLOGY OFFICE NOTE   QUANEISHA, HANISCH                         MRN:          [id number]  DATE:04/29/2006                            DOB:          [birth year]    REASON FOR VISIT:  Reestablish cardiology follow up.   HISTORY OF PRESENT ILLNESS:  Ms. Clites is a 75 year old woman previously  followed by Dr. Dorethea Clan in our [place] office.  She was referred for  outpatient cardiac catheterization, given fairly chronic symptoms of dyspnea  on exertion as well as intermittent chest pain.  She had had a previous  cardiac catheterization in 2004 by Dr. Samule Ohm, demonstrating nonobstructive  coronary artery disease with a 50% right coronary artery stenosis.  Dr.  Dorethea Clan scheduled the repeat cardiac catheterization with me in July 2007,  and her coronary anatomy looked relatively stable with nonobstructive left  system disease, no greater than 40% and an area of 60% stenosis within the  mid right coronary artery.  Ejection fraction was 60%, and it was felt that  the patient's coronary anatomy was not an absolute explanation for her  shortness of breath, or for that matter, her recurrent chest pain.  She was  noted to have 2+ mitral regurgitation at catheterization, and was referred  for a follow up echocardiogram in [employer], which was done on July 20.  This study confirmed normal left ventricular function, although, with a  vigorous left ventricular ejection fraction of 70-75%.  Also noted was  moderate concentric left ventricular hypertrophy and mitral regurgitation  was graded at only mild.  The right ventricular  chamber size and  contraction were normal, and there was mild tricuspid regurgitation.  She  was last seen in the office by Dr. Dorethea Clan in August, and it was felt that  most likely her symptoms were multifactorial, with a reported history of  underlying chronic obstructive pulmonary disease and most  likely an element  of diastolic dysfunction.  She has requested to follow up with me now in the  [place] office.   Mrs. Murchison continues to complain to dyspnea on exertion, rating it at  levels ranging from NYHA class II to III. She also reports intermittent  chest pain.  She states that she takes sublingual nitroglycerin  occasionally.  She was recently seen in the office for blood work by Dr.  Renard Matter, and I note a normal troponin I level of 0.01.  Her  electrocardiogram today is also normal showing sinus rhythm at 62 beats per  minute.  I had a fairly long discussion with her today reviewing her recent  angiographic findings and discussing indications and benefits of  revascularization as well as risk factor modification strategies.  She  discussed other diagnoses and seemed somewhat mistrusting of physicians in  general, describing to me several unrelated misdiagnoses that she has had  over the years.  I asked her about formal pulmonary evaluation, given her  reported history of chronic obstructive pulmonary disease.  I am not aware  of any pulmonary function tests.  She also tells  me that she has been  fatigued and feels as if she is not rested in the mornings.  I reviewed her  medications, and asked her specifically about her cardiac regimen.  She  tells me that she has been taking her medications regularly, and brought her  bottles into the office today.   ALLERGIES:  SULFA DRUGS.   MEDICATIONS:  1. Diovan HCT 160/25 mg p.o. daily.  2. Nexium 40 mg p.o. b.i.d.  3. Aspirin 81 mg p.o. daily.  4. Clonazepam 0.5 mg p.o. t.i.d.  5. Folic acid 400 mcg two tablets p.o. daily.  6. Vitamin B12.  7. Lexapro 20 mg one half tablet p.o. daily.  8. Primidone 250 mg p.o. q.a.m.  9. One half tablet p.o. q.p.m.  10.Calcium supplements with vitamin D.  11.Imdur 30 mg p.o. daily.  12.Atenolol 50 mg p.o. daily.   REVIEW OF SYSTEMS:  As described in history of present illness.  She has  no  chronic cough or hemoptysis.  She denies any active wheezing.  She is not on  any metered dose inhalers at this time.  She denies any significant problems  with palpitations or syncope.   PHYSICAL EXAMINATION:  VITAL SIGNS:  Blood pressure 128/84, heart rate 63,  weight 174 pounds.  GENERAL:  The patient is in no acute distress.  She has a resting tremor  (apparently followed by a neurologist in [place]).  NECK:  No elevated jugular venous pressure or carotid bruits.  No  thyromegaly noted.  LUNGS:  Diminished breath sounds, but no active wheezing or labored  breathing.  There are no rales.  CARDIAC:  A regular rate and rhythm with somewhat distant heart sounds.  No  loud murmur or S3 gallops.  No pericardial rub.  ABDOMEN:  Soft with no hepatosplenomegaly.  No bruits.  EXTREMITIES:  No significant pitting edema.   IMPRESSION/RECOMMENDATIONS:  1. Fairly long standing dyspnea on exertion as outlined above as well as      intermittent chest pain.  Mrs. Shew clearly has underlying coronary      artery disease, although, with her most significant lesion being a 60%      stenosis in the right coronary artery.  Prior to this, she had a      myocardial perfusion study in January that did not suggest any      ischemia.  It seems unlikely that the patient's coronary anatomy is a      complete explanation of her symptomatology, and I suggested that      perhaps additional evaluation might be needed.  There is a question as      to whether she has underlying chronic obstructive pulmonary disease,      although, apparently does have a prior tobacco use history.  I      discussed referral for a formal pulmonary evaluation.  Consideration      could be given to an ambulatory oximetry evaluation, pulmonary function      tests and perhaps even a sleep study given her description of daytime      somnolence and feeling of poor sleep at night.  I explained that I did     not think intervening  on the right coronary artery would necessarily      lead to any change in symptoms, and for that matter, produce a risk of      myocardial infarction.  I explained the critical importance of regular      medication follow  up, particularly as it relates to blood pressure      control, and follow up of lipid status.  I have arranged for her to      have a lipid profile and have scheduled a follow up visit with [place] over      the next 6 months.  Will await further recommendations for pulmonary      medication.  We can certainly see her back if symptoms change.  She      will      otherwise continue to follow up with Dr. Renard Matter.  2. Further plans to follow.       Jonelle Sidle, MD     SGM/MedQ  DD:  04/29/2006  DT:  05/01/2006  Job #:  [id number]   cc:   Angus G. Renard Matter, MD

## 2010-12-20 NOTE — Cardiovascular Report (Signed)
NAME:  [name], APLIN NO.:  [id number]   MEDICAL RECORD NO.:  [id number]          PATIENT TYPE:  OIB   LOCATION:  1963                         FACILITY:  [facility]   PHYSICIAN:  Jonelle Sidle, M.D. LHCDATE OF BIRTH:  1936-04-30   DATE OF PROCEDURE:  02/03/2006  DATE OF DISCHARGE:                              CARDIAC CATHETERIZATION   REQUESTING CARDIOLOGIST:  Vida Roller, M.D.   PRIMARY CARE PHYSICIAN:  Angus G. McInnis, MD.   INDICATIONS:  Ms. Mcferran is a 75 year old woman with a history of  gastroesophageal reflux disease, hypertension, chronic obstructive pulmonary  disease and known previously documented nonobstructive coronary artery  disease.  She had a myocardial perfusion study in January of this year which  did not demonstrate any significant ischemia.  She has had some recurrent  episodes of chest pain and shortness of breath, although not clearly  exertional in nature.  She has been referred now for diagnostic cardiac  catheterization after reviewing the risks and benefits.  Informed consent  was obtained.   PROCEDURES PERFORMED:  1.  Left heart catheterization.  2.  Selective coronary angiography.  3.  Left ventriculography.   ACCESS AND EQUIPMENT:  The area about the right femoral artery was  anesthetized with 1% lidocaine and a 4-French sheath was placed in the right  femoral artery following a single anterior wall puncture using the modified  Seldinger technique.  Standard preformed 4-French JL-4 and JR-4 catheters  were used for selective coronary angiography and an angled pigtail catheter  was used for left heart catheterization and left ventriculography.  A total  of 85 mL Omnipaque were used.  All exchanges were made over a wire.  The  patient tolerated the procedure well without immediate complications.   HEMODYNAMICS:  Aorta 160/73 mmHg.  Left ventricle 164/60 mmHg.   ANGIOGRAPHIC FINDINGS:  1.  The left main coronary artery  exhibits mild calcification with      approximately 20% stenosis.  This vessel gives rise to the left anterior      descending and circumflex coronary arteries.  2.  The left anterior descending is a medium caliber vessel with two      diagonal branches.  The vessel was calcified in its proximal segment.      There was 30% proximal stenosis and 40% diffuse mid vessel stenosis      around the takeoff of the second diagonal branch.  The ostium of the      second diagonal branch is 40% stenosed.  3.  The circumflex coronary artery is a medium caliber vessel with three      obtuse marginal branches, the second and third of which are the largest.      There was 30% proximal circumflex stenosis and 20% mid vessel stenosis      with other minor luminal irregularities.  4.  The right coronary artery is a medium to large caliber vessel which is      dominant with large posterior descending branch.  There is calcification      in the proximal vessel.  In the mid vessel segment is a relatively focal      area of stenosis that looks to be approximately 60%.  There is 30-40%      diffuse stenosis in the mid vessel.   Left ventriculography was performed in the RAO projection and reveals an  ejection fraction approximately 60% with no anterior or inferior wall motion  abnormality and approximately 2+ mitral regurgitation.   DIAGNOSES:  1.  Coronary disease as outlined including a 60% mid right coronary artery      stenosis.  2.  Left ventricular ejection fraction approximately 60% with 2+ mitral      regurgitation and left renal end-diastolic pressure of 16  mmHg and no      significant wall motion abnormalities.   DISCUSSION:  I reviewed the results with the patient, her niece and with Dr.  Dorethea Clan.  At this point, it seems unlikely that the right coronary artery  stenosis is leading to rest symptoms, particularly in the absence of  reproducible exertional symptoms.  Plan at this point will be to  continue  medical therapy.  We will expand her antianginal regimen to include Imdur to  see if this makes any change.  May also be reasonable to consider a follow-  up echocardiogram to assess the mitral regurgitation a bit more.  Follow-up  will be arranged in our regional office.      Jonelle Sidle, M.D. [facility]  Electronically Signed     SGM/MEDQ  D:  02/03/2006  T:  02/03/2006  Job:  [id number]   cc:   Vida Roller, M.D.  Fax: [phone]   Angus G. Renard Matter, MD  Fax: [phone]

## 2010-12-20 NOTE — H&P (Signed)
NAME:  Navy, Rothschild                              ACCOUNT NO.:  [id number]   MEDICAL RECORD NO.:  [id number]                   PATIENT TYPE:   LOCATION:                                       FACILITY:   PHYSICIAN:  Lionel December, M.D.                 DATE OF BIRTH:   DATE OF ADMISSION:  DATE OF DISCHARGE:                                HISTORY & PHYSICAL   CHIEF COMPLAINT:  Difficulty swallowing.   HISTORY OF PRESENT ILLNESS:  Shalayna is a 75 year old Caucasian female with a  history of gastroesophageal reflux disease, esophageal dysphagia with  multiple dilatations, nonspecific esophageal motility disorder, and IBS who  presents today for further evaluation of progressive dysphagia.  She had an  upper endoscopy in May 2003 for dysphagia.  She has a history of prior  distal esophageal rings.  On this study she was found to have a small  sliding hiatal hernia without a stricture or ring formation.  However, with  passage of 13 French dilator there was mucosal injury at the cervical  esophagus indicating a web.  The patient states that her dysphagia improved  for several months but has now gradually returned.  She is having trouble  swallowing most anything but especially with solid foods.  Sometimes she  will wake up in the middle of the night and cough and have food come back  into her mouth.  She complains of epigastric pain which radiates into her  back.  She has some nausea but no vomiting.  She continues to have  alternating constipation/diarrhea.  She denies any melena.  She occasionally  sees bright red blood when she strains.   CURRENT MEDICATIONS:  1. Primidone 250 mg daily.  2. Ativan 1 mg daily.  3. Lipitor 10 mg daily.  4. Maxzide 37.5 mg daily.  5. Zoloft 50 mg q.h.s.  6. Multivitamin daily.  7. Robaxin 750 mg one-half tablet to one t.i.d.  8. Ibuprofen 200 mg four tablets p.r.n.  9. Sinemet 25/250 mg t.i.d.  10.      Requip one daily.  11.      Aspirin 81 mg daily.  12.      Vitamin E 1000 international units daily.  13.      B12 1000 mg daily.  14.      Nexium 40 mg daily p.r.n.   ALLERGIES:  TRIAVIL and SULFA.   PAST MEDICAL HISTORY:  She has longstanding anxiety neurosis.  She also has  Parkinson's disease, hypercholesterolemia.  She tells me she was recently  diagnosed with B12 deficiency.  History of IBS, chronic GERD.  H. pylori  gastritis treated in the past.  She had a normal gastric emptying study in  October 1996.  She has a history of esophageal ring and hiatal hernia in her  last EGD in 2003 as outlined above.  In January 2001 she  had a colonoscopy  which revealed a cecal hyperplastic polyp.  She has had a cholecystectomy.  She has external hemorrhoids.  She has a history of nonspecific esophageal  motility disorder at time of manometry in 1995 at [facility].  Fifty percent of swallows were not transmitted.   FAMILY HISTORY:  The patient tells me her mother had colon cancer initially  spread to oral carcinoma.  Father had esophageal cancer.  Her brother died  of brain tumor, another brother and sister died of MI.   REVIEW OF SYSTEMS:  Please see HPI for GI.  GENERAL:  Denies any weight  loss.   PHYSICAL EXAMINATION:  VITAL SIGNS:  Weight 169.5, blood pressure 144/90,  pulse 80.  GENERAL:  Pleasant, mildly obese Caucasian female.  There is prominent  tremor of her head and both hands.  SKIN:  Warm and dry, no jaundice.  HEENT:  Conjunctivae are pink, sclerae nonicteric.  Oropharyngeal mucosa  moist and pink; no lesions, erythema, or exudate.  No lymphadenopathy or  thyromegaly.  CHEST:  Lungs are clear to auscultation.  CARDIAC:  Reveals regular rate and rhythm, normal S1, S2.  No murmurs, rubs,  or gallops.  ABDOMEN:  Positive bowel sounds, soft, nondistended.  She has mild  tenderness to deep palpation throughout her entire abdomen.  No organomegaly  or masses.  EXTREMITIES:  No edema.    IMPRESSION:  Chronic gastroesophageal reflux disease as well as recurrent  esophageal dysphagia.  She has had evidence of strictures in the past with  dysphagia responding to dilatation.  She also has a nonspecific esophageal  motility disorder as outlined above.  I feel it is reasonable at this time  to offer her another endoscopy with dilatation to see if this helps improve  her dysphagia.  In addition, she tells me that her mother had colon cancer  which is different than history given to [place] previously.  The patient's last  colonoscopy was 2001.  Could consider offering the patient a colonoscopy at  five years given family history.   PLAN:  1. EGD with dilatation in near future.  2. Nexium samples provided to the patient.  3. Would offer Ms. Bhavsar another colonoscopy in 2006 based on family     history of colon cancer.     _____________________________________  ___________________________________________  Tana Coast, P.A.                      Lionel December, M.D.   LL/MEDQ  D:  05/04/2003  T:  05/04/2003  Job:  [id number]   cc:   Dr. Sherryll Burger

## 2010-12-20 NOTE — Procedures (Signed)
[name], [name]                  ACCOUNT NO.:  [id number]   MEDICAL RECORD NO.:  [id number]          PATIENT TYPE:  REC   LOCATION:  [facility]                          FACILITY:  [facility]   PHYSICIAN:  Erick Colace, M.D.DATE OF BIRTH:  1936/03/03   DATE OF PROCEDURE:  08/26/2004  DATE OF DISCHARGE:                                 OPERATIVE REPORT   DATE OF BIRTH:  11/10/1935.   PROCEDURE:  Right L5 transforaminal epidural steroid injection under  fluoroscopic guidance.   INDICATION FOR PROCEDURE:  Right lower extremity pain with radiculopathy  demonstrated on EMG studies as well as MRI demonstrating right L5-S1  foraminal stenosis.   Informed consent was obtained after describing the risks, benefits, and  alternatives to the patient.  These include bleeding, bruising, infection,  loss of bowel and bladder function, either temporary or permanent paralysis.  She elected to proceed.   The patient was placed prone on fluoroscopy table with Betadine prep,  sterile drape.  A 25-gauge 1-1/2 inch needle was used to anesthetize the  skin and subcu tissues with 1% lidocaine x3 mL.  Then a 22-gauge 3-1/2 inch  spinal needle was inserted under fluoroscopic guidance into the right L5-S1  foramen.  This was done under oblique, lateral and then finally AP imaging.  Once the needle was in final position, Omnipaque 180 x 0.5 mL was injected,  demonstrated nerve root flow as well as subpedicular into the epidural  space.  Then a solution containing 1 mg of 40 mg/mL Kenalog plus 2 mL of  methylparaben-free lidocaine 1% was injected.  The patient tolerated the  procedure well.  Post-injection instructions given.  She will follow up in  three weeks.  If she has good pain relief at that point, we will not  reinject.  If she has partial pain relief, will reinject, and if there is no  pain relief at all, consider an L5-S1 translaminar epidural steroid  injection.      AEK/MEDQ  D:   08/26/2004 15:21:10  T:  08/26/2004 15:42:03  Job:  [id number]   cc:   Darleen Crocker A. Gerilyn Pilgrim, M.D.  [address] [place]  [place]  [place] [address]  Fax: [phone]

## 2010-12-20 NOTE — H&P (Signed)
NAME:  [name], [name]                  ACCOUNT NO.:  [id number]   MEDICAL RECORD NO.:  [id number]          PATIENT TYPE:  INP   LOCATION:  A225                          FACILITY:  [facility]   PHYSICIAN:  Angus G. Renard Matter, MD   DATE OF BIRTH:  01/28/1936   DATE OF ADMISSION:  06/10/2004  DATE OF DISCHARGE:  LH                                HISTORY & PHYSICAL   A 75 year old white female admitted through the office after having been  seen with the chief complaint being anterior chest pain which developed over  the weekend, associated with shortness of breath. Pain was located in the  anterior chest with radiation toward the neck. The patient has a history of  dyslipidemia. She had an electrocardiogram done in the office which did not  show evidence of ischemia, but with the location of the patient's pain, it  was felt that she should be admitted to rule out ischemic heart disease.   SOCIAL HISTORY:  The patient was a former cigarette smoker. Does not drink.   FAMILY HISTORY:  Positive for diabetes, brain tumor, and cancer of the  esophagus.   PAST SURGICAL HISTORY:  The patient has a history of having had a  hysterectomy, appendectomy, cholecystectomy.   PAST MEDICAL HISTORY:  The patient has a history of benign essential tremor.  The patient has had chronic neck pain, possible radiculopathy, carpal tunnel  syndrome.   ALLERGIES:  SULFA.   MEDICATIONS:  1.  Lotensin 10 mg daily.  2.  Nexium 40 mg daily.  3.  Aspirin 81 mg daily.  4.  Primidone 125 mg b.i.d.  5.  Mobic 7.5 mg daily.  6.  Folic acid 1 mg.  7.  B12 injection monthly.   REVIEW OF SYSTEMS:  HEENT:  Negative. CARDIOPULMONARY:  No cough or  hemoptysis, but the patient has had anterior chest pain with shortness of  breath. GASTROINTESTINAL:  No nausea, vomiting, or diarrhea. GENITOURINARY:  No dysuria or hematuria.   PHYSICAL EXAMINATION:  GENERAL:  Alert but uncomfortable white female with  blood pressure 130/80. Pulse  60, respirations 18, temperature 98.  HEENT:  Eyes:  PERRLA. TMs negative. Oropharynx benign.  NECK:  Supple. No JVD or thyroid abnormalities.  HEART:  Regular rhythm with murmurs. No cardiomegaly.  LUNGS:  Clear to P&A.  BREASTS:  Normal, no masses.  ABDOMEN:  No palpable organs or masses. No organomegaly.  PELVIC:  Pelvic examination not performed.  SKIN:  Warm and dry.   ASSESSMENT:  The patient was admitted with anterior chest pain to rule out  ischemic heart disease.     Angu   AGM/MEDQ  D:  06/10/2004  T:  06/10/2004  Job:  [id number]

## 2010-12-20 NOTE — Consult Note (Signed)
NAME:  [name], [name] NO.:  [id number]   MEDICAL RECORD NO.:  [id number]          PATIENT TYPE:  INP   LOCATION:  A225                          FACILITY:  [facility]   PHYSICIAN:  Vida Roller, M.D.   DATE OF BIRTH:  Mar 01, 1936   DATE OF CONSULTATION:  06/10/2004  DATE OF DISCHARGE:                                   CONSULTATION   HISTORY OF PRESENT ILLNESS:  Ms. [name] is a 75 year old woman who has a  past medical history of benign essential tremor, hypertension, and  hyperlipidemia, who presented to see Dr. Renard Matter, complaining of discomfort  in her chest.  She was admitted to [facility], where the pain resolved with  some sublingual nitroglycerin, and we were asked to see her.  She denies any  PND or orthopnea, but has significant dyspnea on exertion which she thought  was probably due to her COPD but appears to be associated with some  discomfort in her chest.  She also has chest pain at rest, but the  characteristics of the pain are atypical from coronary disease.  She is  currently painfree.   PAST MEDICAL HISTORY:  1.  Hypertension, which has been poorly controlled long-term.  She is on      Lotensin for this.  2.  Hyperlipidemia, the details of which are not available to me, but she is      on Lipitor for that.  3.  She has a previous history of tobacco abuse and has COPD by history, the      extent of which is unclear to me, but she quit smoking about 20 years      ago.  4.  She has a long history of benign essential tremor, which appears to run      in her family.  She has had an extensive evaluation previously,      including evaluations at [facility], where she was diagnosed with      Parkinson's disease, which I guess was a misdiagnosis.  5.  She has a history of esophageal dysmotility.  She has had an extensive      evaluation at [facility]  She was recently      admitted in September of last year for evaluation and underwent  an upper      endoscopy with esophageal dilatation.  6.  History of B12 deficiency.   CURRENT MEDICATIONS:  1.  Primidone 250 mg once daily.  2.  Ativan 1 mg daily.  3.  Lipitor 10 mg daily.  4.  Maxzide 37.5 mg once daily.  5.  Zoloft 50 mg daily.  6.  Multivitamin once daily.  7.  Robaxin 750 mg 1/2 tablet 3 times a day.  8.  Ibuprofen 200 mg 4 times a day.  9.  Aspirin 81 mg daily.  10. Vitamin E 1000 I. U. daily.  11. Vitamin B12 1000 mg once daily.  12. Nexium 40 mg once daily.   She is allergic to Triavil sulfa.   FAMILY HISTORY:  She has people with coronary disease  on both sides of her  family.  She has had a brother and a sister both die of a myocardial  infarction, one suddenly.   REVIEW OF SYSTEMS:  Essentially negative except for that reviewed in the  history of present illness.   PHYSICAL EXAMINATION:  VITAL SIGNS:  Blood pressure 200/110, pulse 76,  respiratory rate 14.  She is afebrile.  GENERAL:  She is a well-developed and well-nourished white female who looks  older than her stated age.  HEENT:  Unremarkable.  NECK:  Supple.  There is no jugular venous distention or carotid bruits.  LUNGS:  She has decreased breath sounds at the bases.  HEART:  Nondisplaced point of maximal impulse.  There are no lifts or  thrills.  First and second heart sounds are normal.  There is no third or  fourth heart sound.  ABDOMEN:  Soft and nontender with normoactive bowel sounds.  No  hepatosplenomegaly is noted.  EXTREMITIES:  Without significant clubbing, cyanosis or edema.   Electrocardiogram shows a sinus rhythm at a rate of 76 with normal intervals  and normal axes.  No ischemic ST/T wave changes.  There are no Q waves  concerning for an old myocardial infarction.  She does have occasional PVC.   LABORATORY:  Her white blood cell count is 5.6, H&H of 12.5 and 36.4 with a  platelet count of 232.  Her complete metabolic panel is sodium 139,  potassium 3.5, chloride 107,  bicarb 26, glucose 94, BUN 10, creatinine 1.7.  Her liver function studies are all within normal limits.  Her initial set of  cardiac enzymes show a CK of 93, an MB fraction of 2.3, and a troponin of  0.39.   Chest x-ray was not performed.   She is on the following medications in the hospital:  Aspirin 81 mg a day,  benazepril 20 mg once daily, metoprolol 12.5 mg b.i.d., Protonix 40 mg p.o.  q.d., Primidone 125 mg b.i.d., Ativan p.r.n., morphine p.r.n., nitroglycerin  p.r.n., Phenergan p.r.n., Ambien p.r.n., Lorazepam 0.5 mg once daily.   ASSESSMENT:  1.  This is a woman with chest discomfort which is atypical for coronary      disease, but she has multiple cardiac risk factors.  Her cardiac      enzymes, the initial set, are abnormal but not in a classic pattern to      suggest an acute myocardial infarction.  Her EKG does not show any      active ischemia.  2.  Hypertension, which is poorly controlled.  3.  Hyperlipidemia, unknown status.  4.  Chronic obstructive pulmonary disease.  5.  Benign essential tremor.  6.  Gastroesophageal reflux disease, status post esophageal stricture      treatment.   My plan is to continue to cycle her enzymes.  Continue the aspirin.  I would  probably anticoagulate her with subcutaneous Lovenox.  She needs an  echocardiogram.  If the echocardiogram shows no significant decremental left  ventricular systolic function, I think she probably benefits from a  Cardiolite.  Obviously, if she rules in by enzymes by myocardial infarction,  then she will need aggressive treatment for that and probably needs a heart  catheterization.  She will need a nitroglycerin to treat her blood pressure  along with the other medications that she is on.    Jeff  JH/MEDQ  D:  06/10/2004  T:  06/10/2004  Job:  [id number]

## 2010-12-20 NOTE — Assessment & Plan Note (Signed)
[name]                            CARDIOLOGY OFFICE NOTE   Katrina, [name]                         MRN:          [id number]  DATE:10/14/2006                            DOB:          [birth year]    PRIMARY CARE PHYSICIAN:  Dr. Butch Penny.   REASON FOR VISIT:  Cardiac followup.   HISTORY OF PRESENT ILLNESS:  [name] was in the office in late  February.  Her history and symptoms are detailed in the previous note by  Tereso Newcomer.  I saw the patient also at that time and we reviewed her  most recent evaluation which was predominantly pulmonary.  We also noted  that she did not have any significant oxygen desaturation with  ambulation and arranged a followup echocardiogram to reassess left  ventricular function as well as her pulmonary artery systolic pressure.  This study revealed an ejection fraction of 60-65% with mild diastolic  dysfunction, trivial aortic valve regurgitation, mild atrial  calcification with mild mitral regurgitation, and overall normal  pulmonary artery systolic pressure.  I reviewed this with her today and  continue to recommend medical therapy/risk factor modification from the  perspective of her cardiac disease.  It is not clear to me that all of  her symptoms, specifically chest pain and dyspnea, are cardiac related  based on the evaluation she has had to date.  She does remain at risk  however for myocardial infarction and other adverse cardiac events,  given her previously documented nonobstructive coronary atherosclerosis.  I discussed this with her today and pointed out the merits of her  medical regimen, including statin therapy which we reinitiated recently.  She is due to have followup lipids and liver function tests in the next  few weeks.  She was comfortable with this and plans to return to see [place]  over the next 6 months.   ALLERGIES:  SULFA DRUGS.   CURRENT MEDICATIONS:  1. Diovan HCT 160/25 mg p.o.  daily.  2. Nexium 40 mg p.o. b.i.d.  3. Aspirin 81 mg p.o. daily.  4. Clonazepam 0.5 mg p.o. t.i.d. p.r.n.  5. Folic acid 400 mcg 2 tablets p.o. daily.  6. Vitamin B12 as directed.  7. Primidone 250 mg p.o. q.a.m. and 1/2 tablet p.o. q.p.m.  8. Imdur 30 mg p.o. daily.  9. Atenolol 50 mg p.o. daily.  10.Multivitamin 1 p.o. daily.  11.Lipitor 40 mg p.o. daily.   REVIEW OF SYSTEMS:  As per history of present illness.   PHYSICAL EXAMINATION:  Blood pressure is 127/75, heart rate is 69,  weight is 170 pounds.  The patient is comfortable, in no acute distress.  NECK:  No elevated jugular venous pressure, without bruits.  No  thyromegaly is noted.  LUNGS:  Clear, without labored breathing.  CARDIAC:  Regular rate and rhythm.  No loud systolic murmur or S3  gallop.  EXTREMITIES:  No significant pitting edema.   Twelve-lead electrocardiogram today shows sinus rhythm at 69 BPM.   IMPRESSION/RECOMMENDATIONS:  1. History of chronic dyspnea on exertion as well as recurrent,  somewhat atypical, chest pain in the setting of known previously      documented nonobstructive coronary atherosclerosis by cardiac      catheterization in July of 2007.  Electrocardiogram is normal and      follow up echocardiography reveals preserved left ventricular      systolic function with mild diastolic dysfunction, no major      valvular abnormalities and no evidence of pulmonary hypertension.      My plan is to continue medical therapy aimed at risk factor      modification.  We will plan to follow up liver function and lipids      over the next few weeks, aiming for an LDL of around 70 if      possible.  I will otherwise plan to see her back over the next 6      months.  2. Further plans to follow.     Jonelle Sidle, MD  Electronically Signed    SGM/MedQ  DD: 10/14/2006  DT: 10/15/2006  Job #: [id number]   cc:   Angus G. Renard Matter, MD

## 2010-12-20 NOTE — Op Note (Signed)
NAME:  [name], [name]                  ACCOUNT NO.:  [id number]   MEDICAL RECORD NO.:  [id number]          PATIENT TYPE:  AMB   LOCATION:  DAY                           FACILITY:  [facility]   PHYSICIAN:  Lionel December, M.D.    DATE OF BIRTH:  1936/05/30   DATE OF PROCEDURE:  10/14/2004  DATE OF DISCHARGE:                                 OPERATIVE REPORT   PROCEDURE:  Total colonoscopy.   INDICATION:  Brindley is a 75 year old Caucasian female who is undergoing high-  risk screening colonoscopy.  Last exam was in January 04, 2000.  Mother was  diagnosed with colon carcinoma in her 38s.  The patient has intermittent  right-sided abdominal pain, felt to have IBS.  She had negative CT in the  past.   The procedures were reviewed the patient and informed consent was obtained.   PREMEDICATION:  Demerol 25 mg IV, Versed 6 mg IV.   FINDINGS:  Procedure performed in endoscopy suite.  The patient's vital  signs and O2 saturation were monitored during procedure and remained stable.  The patient was placed in the left lateral position and rectal examination  performed.  No abnormality noted on external or digital exam.  The Olympus  video scope was placed in the rectum and advanced under vision into sigmoid  colon and beyond.  Preparation was satisfactory.  Scope was passed into  cecum, which was identified by appendiceal stump and ileocecal valve.  Pictures taken for the record.  As the scope was withdrawn, colonic mucosa  was once again carefully examined and was normal throughout.  Rectal mucosa  was normal.  The scope was retroflexed to examine anorectal junction, and  small hemorrhoids were noted below the dentate line.  The endoscope was then withdrawn.  The patient tolerated the procedure well.   FINAL DIAGNOSIS:  Normal colonoscopy except small external hemorrhoids.   RECOMMENDATIONS:  She will continue yearly Hemoccults and consider next  screening exam in five years from now.      NR/MEDQ  D:   10/14/2004  T:  10/14/2004  Job:  [id number]   cc:   Angus G. Renard Matter, MD  [address]  [place]  [place] [address]  Fax: [phone]

## 2011-01-06 ENCOUNTER — Other Ambulatory Visit: Payer: Self-pay

## 2011-01-06 MED ORDER — CLOPIDOGREL BISULFATE 75 MG PO TABS: 75.0000 mg | ORAL_TABLET | Freq: Every day | ORAL | Status: DC

## 2011-01-28 ENCOUNTER — Ambulatory Visit (INDEPENDENT_AMBULATORY_CARE_PROVIDER_SITE_OTHER): Payer: MEDICARE | Admitting: Internal Medicine

## 2011-02-18 ENCOUNTER — Ambulatory Visit: Payer: MEDICARE | Admitting: Orthopedic Surgery

## 2011-02-25 ENCOUNTER — Ambulatory Visit (INDEPENDENT_AMBULATORY_CARE_PROVIDER_SITE_OTHER): Payer: Medicare Other | Admitting: Orthopedic Surgery

## 2011-02-25 ENCOUNTER — Encounter: Payer: Self-pay | Admitting: Orthopedic Surgery

## 2011-02-25 VITALS — Resp 20 | Ht 64.0 in | Wt 150.0 lb

## 2011-02-25 DIAGNOSIS — M719 Bursopathy, unspecified: Secondary | ICD-10-CM

## 2011-02-25 DIAGNOSIS — M75102 Unspecified rotator cuff tear or rupture of left shoulder, not specified as traumatic: Secondary | ICD-10-CM

## 2011-02-25 MED ORDER — METHYLPREDNISOLONE ACETATE 40 MG/ML IJ SUSP: 40.0000 mg | Freq: Once | INTRAMUSCULAR | Status: DC

## 2011-02-25 NOTE — Progress Notes (Signed)
Separate x-ray report [facility], lateral, LEFT shoulder pacemaker, seen in the LEFT shoulder.  Type acromion is seen with impingement-like symptoms, signs in the x-ray.  Impression normal shoulder joint with impingement syndrome.-like finding

## 2011-02-25 NOTE — Patient Instructions (Signed)
You have received a steroid shot. 15% of patients experience increased pain at the injection site with in the next 24 hours. This is best treated with ice and tylenol extra strength 2 tabs every 8 hours. If you are still having pain please call the office.    

## 2011-02-25 NOTE — Progress Notes (Signed)
  Shoulder Injection Procedure Note  Pre-operative Diagnosis: left shoulder rotator cuff syndrome  Post-operative Diagnosis: same  Indications: shoulder impingement  Anesthesia: Ethel chloride   Procedure Details   Verbal consent was obtained for the procedure. The shoulder was prepped alcohol and the skin was anesthetized. Using a 20 gauge needle the subacromial space is injected with 4 mL 1% lidocaine and 1 mL of Depo-Medrol 40 mg per mL under the posterior aspect of the acromion. The injection site was cleansed with topical isopropyl alcohol and a dressing was applied.  Complications:  none   Subjective:    [name] is a 75 y.o. female who presents with left shoulder pain. The symptoms began 2 mos ago. Aggravating factors: no known event. Pain is located around the deltoid. Discomfort is described as throbbing. Symptoms are exacerbated by overhead movements. Evaluation to date: none. Therapy to date includes: nothing specific.  The following portions of the patient's history were reviewed and updated as appropriate: problem list.  Review of Systems loss, ordering, chest pain, shortness of breath, wheezing, cough, pain on inspiration, heartburn, nausea, vomiting, constipation, diarrhea, tingling, tremors, muscle pain, nervousness, anxiety, easy bleeding and seasonal allergies.   Objective:    Resp 20  Ht 5\' 4"  (1.626 m)  Wt 150 lb (68.04 kg)  BMI 25.75 kg/m2 Right shoulder: normal active ROM, no tenderness, no impingement sign  Left shoulder: normal active ROM, no tenderness, no impingement sign and the LEFT shoulder is tender over the anterolateral acromion and deltoid with painful forward elevation. Positive impingement sign. Decreased internal rotation. Forward flexion. Active equals 100, passive equals 130 with pain in the arc of motion between 100, and 130. No instability.    Vital signs are stable as recorded  General appearance is normal  The patient is alert  and oriented x3  The patient's mood and affect are normal  Gait assessment: normal  The cardiovascular exam reveals normal pulses and temperature without edema swelling.  The lymphatic system is negative for palpable lymph nodes  The sensory exam is normal.  There are no pathologic reflexes.  Balance is normal.   Assessment:    Left rotator cuff tendinitis, rotator cuff impingement    Plan:    Reduction in offending activity. Shoulder injection. See procedure note.

## 2011-03-05 ENCOUNTER — Ambulatory Visit (INDEPENDENT_AMBULATORY_CARE_PROVIDER_SITE_OTHER): Payer: Medicare Other | Admitting: Orthopedic Surgery

## 2011-03-05 ENCOUNTER — Encounter: Payer: Self-pay | Admitting: Orthopedic Surgery

## 2011-03-05 DIAGNOSIS — M719 Bursopathy, unspecified: Secondary | ICD-10-CM

## 2011-03-05 DIAGNOSIS — Z9889 Other specified postprocedural states: Secondary | ICD-10-CM

## 2011-03-05 DIAGNOSIS — M75102 Unspecified rotator cuff tear or rupture of left shoulder, not specified as traumatic: Secondary | ICD-10-CM

## 2011-03-05 NOTE — Progress Notes (Signed)
RIGHT shoulder, status post RIGHT rotator cuff repair, doing well. The arm. Elevates well. Adequate strength.  LEFT shoulder status post injection approximately week and a half ago. Still having discomfort with the LEFT shoulder still has positive impingement sign and weakness to manual muscle testing.  She is very familiar with shoulder rehabilitation from the rotator cuff repair, she did her own exercise program at home and is comfortable doing that again on the LEFT side.  We will let her do this for one month and then see her back in see if we need to do anything else.

## 2011-03-05 NOTE — Patient Instructions (Signed)
Do exercises for both shoulders, come back in a month for a recheck on the left shoulder

## 2011-03-10 ENCOUNTER — Other Ambulatory Visit: Payer: Self-pay | Admitting: Cardiology

## 2011-03-10 LAB — LIPID PANEL
Cholesterol: 187 mg/dL (ref 0–200)
HDL: 60 mg/dL (ref >39)
Total CHOL/HDL Ratio: 3.1 Ratio
Triglycerides: 136 mg/dL (ref <150)

## 2011-03-10 LAB — HEPATIC FUNCTION PANEL
ALT: 12 U/L (ref 0–35)
AST: 16 U/L (ref 0–37)
Albumin: 3.9 g/dL (ref 3.5–5.2)
Alkaline Phosphatase: 76 U/L (ref 39–117)

## 2011-03-10 LAB — TSH: TSH: 1.357 u[IU]/mL (ref 0.350–4.500)

## 2011-03-19 ENCOUNTER — Encounter (INDEPENDENT_AMBULATORY_CARE_PROVIDER_SITE_OTHER): Payer: Self-pay

## 2011-03-19 ENCOUNTER — Other Ambulatory Visit: Payer: Self-pay | Admitting: Neurology

## 2011-03-19 DIAGNOSIS — G250 Essential tremor: Secondary | ICD-10-CM

## 2011-03-19 DIAGNOSIS — G609 Hereditary and idiopathic neuropathy, unspecified: Secondary | ICD-10-CM

## 2011-03-19 DIAGNOSIS — R51 Headache: Secondary | ICD-10-CM

## 2011-03-20 ENCOUNTER — Ambulatory Visit (INDEPENDENT_AMBULATORY_CARE_PROVIDER_SITE_OTHER): Payer: Medicare Other | Admitting: *Deleted

## 2011-03-20 DIAGNOSIS — I495 Sick sinus syndrome: Secondary | ICD-10-CM

## 2011-03-20 NOTE — Progress Notes (Signed)
PPM check 

## 2011-03-25 ENCOUNTER — Encounter: Payer: Self-pay | Admitting: Cardiology

## 2011-03-25 ENCOUNTER — Ambulatory Visit (INDEPENDENT_AMBULATORY_CARE_PROVIDER_SITE_OTHER): Payer: Medicare Other | Admitting: Cardiology

## 2011-03-25 VITALS — BP 179/84 | HR 64 | Ht 64.0 in | Wt 150.0 lb

## 2011-03-25 DIAGNOSIS — I2510 Atherosclerotic heart disease of native coronary artery without angina pectoris: Secondary | ICD-10-CM

## 2011-03-25 DIAGNOSIS — I319 Disease of pericardium, unspecified: Secondary | ICD-10-CM

## 2011-03-25 DIAGNOSIS — I4891 Unspecified atrial fibrillation: Secondary | ICD-10-CM

## 2011-03-25 DIAGNOSIS — I495 Sick sinus syndrome: Secondary | ICD-10-CM

## 2011-03-25 DIAGNOSIS — R079 Chest pain, unspecified: Secondary | ICD-10-CM | POA: Insufficient documentation

## 2011-03-25 DIAGNOSIS — R0602 Shortness of breath: Secondary | ICD-10-CM | POA: Insufficient documentation

## 2011-03-25 MED ORDER — AMIODARONE HCL 100 MG PO TABS: 100.0000 mg | ORAL_TABLET | Freq: Every day | ORAL | Status: DC

## 2011-03-25 NOTE — Assessment & Plan Note (Signed)
Atrial paced rhythm today.

## 2011-03-25 NOTE — Assessment & Plan Note (Signed)
As outlined above, status post DES to RCA in 2010. Patient remains on dual antiplatelet therapy.

## 2011-03-25 NOTE — Assessment & Plan Note (Signed)
As outlined above, follow up ischemic testing is being arranged. 2-D echocardiogram will also be assessed to exclude any development of pericardial effusion or worsening LVEF.

## 2011-03-25 NOTE — Assessment & Plan Note (Signed)
Rhythm control has been reasonable. No Coumadin as mentioned previously with prior history of hemorrhagic pericardial effusion, also dual antiplatelet therapy. Amiodarone will be reduced to 100 mg daily.

## 2011-03-25 NOTE — Assessment & Plan Note (Signed)
Reported within the last week, also associated with shortness of breath. She is somewhat vague in describing the symptoms, some of which sound potentially more GI in etiology, however she is medically complex with significant cardiac history as well. ECG is nonspecific today. Plan will be a followup Lexiscan Myoview on medical therapy for ischemic evaluation in light of her prior history of coronary intervention and symptomatology. If reassuring, plan to continue medical therapy and observation from a cardiac perspective.

## 2011-03-25 NOTE — Assessment & Plan Note (Signed)
History of hemorrhagic pericardial effusion status post pericardial window. Followup echocardiogram planned.

## 2011-03-25 NOTE — Patient Instructions (Addendum)
**Note De-Identified  Obfuscation** Your physician has recommended you make the following change in your medication: decrease Amiodarone to 100 mg daily   Your physician has requested that you have an echocardiogram. Echocardiography is a painless test that uses sound waves to create images of your heart. It provides your doctor with information about the size and shape of your heart and how well your heart's chambers and valves are working. This procedure takes approximately one hour. There are no restrictions for this procedure.  Your physician has requested that you have a lexiscan myoview. For further information please visit [email or web address] Please follow instruction sheet, as given.  Your physician recommends that you schedule a follow-up appointment in: 3 months

## 2011-03-25 NOTE — Progress Notes (Signed)
Clinical Summary [name] is a 75 y.o.female presenting for followup. She was seen back in February.  Recent lab work in August shows total cholesterol 187, triglycerides 136, HDL 60, LDL 100, AST 16, ALT 12, TSH 1.3. We reviewed these today.  Office note from [name] was reviewed from earlier in August. There is mention of the possibility of decreasing amiodarone dosing in light of the patient's tremor.  She states that generally she has done "okay" until the last week. She's been experiencing more shortness of breath, also reportedly difficulty "eating," relative fullness in her stomach, also a sharp chest pain, sometimes in the back and arm. She states that she has taken nitroglycerin with some relief of the symptoms, although gets a headache and does not use it very frequently.  Followup ECG is reviewed.   Allergies  Allergen Reactions  . Amitriptyline Hcl   . Sulfonamide Derivatives     Medication list reviewed.  Past Medical History  Diagnosis Date  . Atrial fibrillation   . CAD (coronary artery disease)     DES x 2 to RCA 10/10  . GERD (gastroesophageal reflux disease)   . Tachycardia-bradycardia syndrome     s/p Medtronic Adapta L dual chamber device  5/10  . Hyperlipidemia   . COPD (chronic obstructive pulmonary disease)   . Anxiety   . Resting tremor   . 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 Chronic back pain   . Pericardial effusion     Hemorrhagic     Past Surgical History  Procedure Date  . Cholecystectomy   . Appendectomy   . Subxiphoid pericardial window 11/10  . Esophagogastroduodenoscopy with esophageal dilation 2004, 2006, 2007  . Vaginal hysterectomy   . Colonoscopy 2011  . Right rotator cuff repair     Family History  Problem Relation Age of Onset  . Cancer Mother     Colon   . Coronary artery disease Sister   . Coronary artery disease Brother     Social History [name] reports that she has never smoked. She does  not have any smokeless tobacco history on file. [name] reports that she does not drink alcohol.  Review of Systems No obvious palpitations. Otherwise as outlined above.  Physical Examination Filed Vitals:   03/25/11 0901  BP: 179/84  Pulse: 64   Chronically ill-appearing woman in no acute distress.  HEENT: Conjunctiva and lids are normal, oropharynx clear.  Neck: Supple no elevated jugular venous pressure or bruits.  Lungs: Clear to auscultation with diminished breath sounds. Non-labored breathing at rest.  Cardiac: Regular rate and rhythm without S3 gallop. No pericardial rub or knock.  Abdomen: Protuberant but soft, nontender, bowel sounds present  Extremities: Trace edema.  Skin: Warm and dry.  Musculoskeletal: Kyphosis noted.  Neuropsychiatric: Alert and oriented x3. Affect appropriate. Resting tremor.   ECG Atrial paced rhythm at 77 beats per minute, QTC 484 ms.  Studies Echocardiogram 08/09/2009: - Left ventricle: The cavity size was normal. There was mild     concentric hypertrophy. Systolic function was normal. The     estimated ejection fraction was in the range of 55% to 60%.   - Aortic valve: Mildly to moderately calcified annulus. Trileaflet;     mildly thickened, mildly calcified leaflets. Mild regurgitation.   - Mitral valve: Mildly to moderately calcified annulus. Mild     regurgitation.   - Left atrium: The atrium was mildly dilated.   - Right atrium:  The atrium was mildly dilated.   Problem List and Plan

## 2011-03-28 ENCOUNTER — Encounter (HOSPITAL_COMMUNITY)
Admission: RE | Admit: 2011-03-28 | Discharge: 2011-03-28 | Disposition: A | Discharge status: Home / Self Care | Payer: Medicare Other | Source: Ambulatory Visit | Attending: Cardiology | Admitting: Cardiology

## 2011-03-28 ENCOUNTER — Encounter (HOSPITAL_COMMUNITY): Payer: Self-pay

## 2011-03-28 ENCOUNTER — Encounter (HOSPITAL_COMMUNITY): Payer: Self-pay | Admitting: Cardiology

## 2011-03-28 ENCOUNTER — Ambulatory Visit (HOSPITAL_COMMUNITY)
Admission: RE | Admit: 2011-03-28 | Discharge: 2011-03-28 | Disposition: A | Discharge status: Home / Self Care | Payer: Medicare Other | Source: Ambulatory Visit | Attending: Cardiology | Admitting: Cardiology

## 2011-03-28 ENCOUNTER — Ambulatory Visit (INDEPENDENT_AMBULATORY_CARE_PROVIDER_SITE_OTHER): Payer: Medicare Other | Admitting: *Deleted

## 2011-03-28 DIAGNOSIS — E785 Hyperlipidemia, unspecified: Secondary | ICD-10-CM | POA: Insufficient documentation

## 2011-03-28 DIAGNOSIS — J449 Chronic obstructive pulmonary disease, unspecified: Secondary | ICD-10-CM | POA: Insufficient documentation

## 2011-03-28 DIAGNOSIS — R0602 Shortness of breath: Secondary | ICD-10-CM | POA: Insufficient documentation

## 2011-03-28 DIAGNOSIS — R079 Chest pain, unspecified: Secondary | ICD-10-CM

## 2011-03-28 DIAGNOSIS — J4489 Other specified chronic obstructive pulmonary disease: Secondary | ICD-10-CM | POA: Insufficient documentation

## 2011-03-28 DIAGNOSIS — I10 Essential (primary) hypertension: Secondary | ICD-10-CM | POA: Insufficient documentation

## 2011-03-28 DIAGNOSIS — R0789 Other chest pain: Secondary | ICD-10-CM | POA: Insufficient documentation

## 2011-03-28 DIAGNOSIS — I2510 Atherosclerotic heart disease of native coronary artery without angina pectoris: Secondary | ICD-10-CM

## 2011-03-28 DIAGNOSIS — I059 Rheumatic mitral valve disease, unspecified: Secondary | ICD-10-CM

## 2011-03-28 MED ORDER — TECHNETIUM TC 99M TETROFOSMIN IV KIT
10.0000 | PACK | Freq: Once | INTRAVENOUS | Status: AC | PRN
  Administered 2011-03-28: 10.5 via INTRAVENOUS

## 2011-03-28 MED ORDER — TECHNETIUM TC 99M TETROFOSMIN IV KIT
30.0000 | PACK | Freq: Once | INTRAVENOUS | Status: AC | PRN
  Administered 2011-03-28: 28 via INTRAVENOUS

## 2011-03-28 NOTE — Progress Notes (Signed)
Stress Lab Nurses Notes - [facility]  BRIAHNNA HARRIES 03/28/2011  Reason for doing test: CAD, Chest Pain and Dyspnea  Type of test: Steffanie Dunn  Nurse performing test: Parke Poisson, RN  Nuclear Medicine Tech: Lou Cal  Echo Tech: Not Applicable  MD performing test: Ival Bible. MD & Joni Reining. NP  Family MD: Z. Hall  Test explained and consent signed: yes  IV started: 22g jelco, Saline lock flushed, No redness or edema and Saline lock started in radiology  Symptoms: SOB  Treatment/Intervention: None  Reason test stopped: protocol completed  After recovery IV was: Discontinued via X-ray tech and No redness or edema  Patient to return to Nuc. Med at :12:15 pm  Patient discharged: Home  Patient's Condition upon discharge was: stable  Comments: BP 148/72, HR 68, during Lexiscan stress test. Symptoms resolved in recovery.  Erskine Speed T

## 2011-03-28 NOTE — Progress Notes (Signed)
*  PRELIMINARY RESULTS* Echocardiogram 2D Echocardiogram has been performed.  Jorje Guild [place] 03/28/2011, 12:12 PM

## 2011-03-31 ENCOUNTER — Encounter: Payer: Self-pay | Admitting: *Deleted

## 2011-04-03 ENCOUNTER — Other Ambulatory Visit: Payer: Self-pay | Admitting: Neurology

## 2011-04-03 DIAGNOSIS — R51 Headache: Secondary | ICD-10-CM

## 2011-04-04 ENCOUNTER — Telehealth: Payer: Self-pay | Admitting: Cardiology

## 2011-04-08 ENCOUNTER — Ambulatory Visit (INDEPENDENT_AMBULATORY_CARE_PROVIDER_SITE_OTHER): Payer: Medicare Other | Admitting: Orthopedic Surgery

## 2011-04-08 DIAGNOSIS — M719 Bursopathy, unspecified: Secondary | ICD-10-CM

## 2011-04-08 DIAGNOSIS — M75102 Unspecified rotator cuff tear or rupture of left shoulder, not specified as traumatic: Secondary | ICD-10-CM

## 2011-04-08 MED ORDER — METHYLPREDNISOLONE ACETATE 40 MG/ML IJ SUSP: 40.0000 mg | Freq: Once | INTRAMUSCULAR | Status: DC

## 2011-04-08 NOTE — Patient Instructions (Signed)
You have received a steroid shot. 15% of patients experience increased pain at the injection site with in the next 24 hours. This is best treated with ice and tylenol extra strength 2 tabs every 8 hours. If you are still having pain please call the office.   Continue exercise program

## 2011-04-08 NOTE — Progress Notes (Signed)
Followup visit status post RIGHT rotator cuff repair, currently under treatment for LEFT rotator cuff syndrome with home exercise program. She has received a cortisone injection in the LEFT shoulder.  She continues to have painful forward elevation of the LEFT shoulder difficulty reaching behind her back but her pain is not unbearable and she has a history of cardiac disease I reviewed her cardiac records she just had a stress test and medication adjustment  I recommend another subacromial injection and continued exercise program she will follow in 2 months.  Subacromial Shoulder Injection Procedure Note  Pre-operative Diagnosis: left RC Syndrome  Post-operative Diagnosis: same  Indications: pain   Anesthesia: ethyl chloride   Procedure Details   Verbal consent was obtained for the procedure. The shoulder was prepped withalcohol and the skin was anesthetized. A 20 gauge needle was advanced into the subacromial space through posterior approach without difficulty  The space was then injected with 3 ml 1% lidocaine and 1 ml of depomedrol. The injection site was cleansed with isopropyl alcohol and a dressing was applied.  Complications:  None; patient tolerated the procedure well.

## 2011-04-09 ENCOUNTER — Other Ambulatory Visit: Payer: Medicare Other

## 2011-04-22 ENCOUNTER — Other Ambulatory Visit: Payer: Medicare Other

## 2011-05-09 LAB — DIFFERENTIAL
Lymphocytes Relative: 24
Lymphs Abs: 1.4
Monocytes Relative: 6
Neutro Abs: 4.2
Neutrophils Relative %: 70

## 2011-05-09 LAB — POCT CARDIAC MARKERS
CKMB, poc: 1.4
Myoglobin, poc: 79.3
Myoglobin, poc: 96.8
Operator id: 207241

## 2011-05-09 LAB — COMPREHENSIVE METABOLIC PANEL
BUN: 6
Calcium: 9.3
Creatinine, Ser: 0.65
Glucose, Bld: 152 — ABNORMAL HIGH
Sodium: 141
Total Protein: 7.4

## 2011-05-09 LAB — CBC
Hemoglobin: 12.1
MCHC: 32.8
MCV: 88
RDW: 13.6

## 2011-05-12 ENCOUNTER — Encounter (INDEPENDENT_AMBULATORY_CARE_PROVIDER_SITE_OTHER): Payer: Self-pay | Admitting: *Deleted

## 2011-05-12 ENCOUNTER — Ambulatory Visit (INDEPENDENT_AMBULATORY_CARE_PROVIDER_SITE_OTHER): Payer: Medicare Other | Admitting: Internal Medicine

## 2011-05-16 LAB — BLOOD GAS, ARTERIAL
Acid-Base Excess: 0.7
Bicarbonate: 24.5 — ABNORMAL HIGH
O2 Saturation: 95.7
pCO2 arterial: 37.3
pO2, Arterial: 78.4 — ABNORMAL LOW

## 2011-05-27 ENCOUNTER — Ambulatory Visit: Payer: Medicare Other | Admitting: Adult Health

## 2011-06-02 ENCOUNTER — Other Ambulatory Visit: Payer: Self-pay

## 2011-06-02 ENCOUNTER — Inpatient Hospital Stay (HOSPITAL_COMMUNITY)
Admission: EM | Admit: 2011-06-02 | Discharge: 2011-06-04 | DRG: 313 | Disposition: A | Discharge status: Home / Self Care | Payer: Medicare Other | Attending: Internal Medicine | Admitting: Internal Medicine

## 2011-06-02 ENCOUNTER — Encounter (HOSPITAL_COMMUNITY): Payer: Self-pay | Admitting: *Deleted

## 2011-06-02 ENCOUNTER — Emergency Department (HOSPITAL_COMMUNITY): Payer: Medicare Other

## 2011-06-02 DIAGNOSIS — I2510 Atherosclerotic heart disease of native coronary artery without angina pectoris: Secondary | ICD-10-CM

## 2011-06-02 DIAGNOSIS — R002 Palpitations: Secondary | ICD-10-CM | POA: Diagnosis present

## 2011-06-02 DIAGNOSIS — K219 Gastro-esophageal reflux disease without esophagitis: Secondary | ICD-10-CM

## 2011-06-02 DIAGNOSIS — Z950 Presence of cardiac pacemaker: Secondary | ICD-10-CM

## 2011-06-02 DIAGNOSIS — D509 Iron deficiency anemia, unspecified: Secondary | ICD-10-CM | POA: Diagnosis present

## 2011-06-02 DIAGNOSIS — D649 Anemia, unspecified: Secondary | ICD-10-CM

## 2011-06-02 DIAGNOSIS — J449 Chronic obstructive pulmonary disease, unspecified: Secondary | ICD-10-CM | POA: Diagnosis present

## 2011-06-02 DIAGNOSIS — I25110 Atherosclerotic heart disease of native coronary artery with unstable angina pectoris: Secondary | ICD-10-CM | POA: Diagnosis present

## 2011-06-02 DIAGNOSIS — J4489 Other specified chronic obstructive pulmonary disease: Secondary | ICD-10-CM | POA: Diagnosis present

## 2011-06-02 DIAGNOSIS — F411 Generalized anxiety disorder: Secondary | ICD-10-CM

## 2011-06-02 DIAGNOSIS — F419 Anxiety disorder, unspecified: Secondary | ICD-10-CM | POA: Diagnosis present

## 2011-06-02 DIAGNOSIS — I4891 Unspecified atrial fibrillation: Secondary | ICD-10-CM

## 2011-06-02 DIAGNOSIS — R0600 Dyspnea, unspecified: Secondary | ICD-10-CM

## 2011-06-02 DIAGNOSIS — R079 Chest pain, unspecified: Secondary | ICD-10-CM | POA: Diagnosis present

## 2011-06-02 DIAGNOSIS — I25119 Atherosclerotic heart disease of native coronary artery with unspecified angina pectoris: Secondary | ICD-10-CM | POA: Diagnosis present

## 2011-06-02 DIAGNOSIS — G250 Essential tremor: Secondary | ICD-10-CM | POA: Diagnosis present

## 2011-06-02 DIAGNOSIS — E785 Hyperlipidemia, unspecified: Secondary | ICD-10-CM

## 2011-06-02 DIAGNOSIS — R0789 Other chest pain: Principal | ICD-10-CM | POA: Diagnosis present

## 2011-06-02 DIAGNOSIS — I10 Essential (primary) hypertension: Secondary | ICD-10-CM

## 2011-06-02 DIAGNOSIS — I4819 Other persistent atrial fibrillation: Secondary | ICD-10-CM | POA: Diagnosis present

## 2011-06-02 DIAGNOSIS — I319 Disease of pericardium, unspecified: Secondary | ICD-10-CM

## 2011-06-02 DIAGNOSIS — R0602 Shortness of breath: Secondary | ICD-10-CM

## 2011-06-02 DIAGNOSIS — E876 Hypokalemia: Secondary | ICD-10-CM

## 2011-06-02 DIAGNOSIS — Z9861 Coronary angioplasty status: Secondary | ICD-10-CM

## 2011-06-02 DIAGNOSIS — I495 Sick sinus syndrome: Secondary | ICD-10-CM

## 2011-06-02 DIAGNOSIS — R259 Unspecified abnormal involuntary movements: Secondary | ICD-10-CM

## 2011-06-02 LAB — CBC
HCT: 31.3 % — ABNORMAL LOW (ref 36.0–46.0)
Hemoglobin: 10.4 g/dL — ABNORMAL LOW (ref 12.0–15.0)
MCH: 30.3 pg (ref 26.0–34.0)
MCHC: 33.2 g/dL (ref 30.0–36.0)
MCV: 91.3 fL (ref 78.0–100.0)
Platelets: 262 10*3/uL (ref 150–400)
RBC: 3.43 MIL/uL — ABNORMAL LOW (ref 3.87–5.11)
RDW: 14.4 % (ref 11.5–15.5)
WBC: 5.6 10*3/uL (ref 4.0–10.5)

## 2011-06-02 LAB — BASIC METABOLIC PANEL
BUN: 13 mg/dL (ref 6–23)
CO2: 29 mEq/L (ref 19–32)
Calcium: 9.8 mg/dL (ref 8.4–10.5)
Chloride: 100 mEq/L (ref 96–112)
Creatinine, Ser: 0.68 mg/dL (ref 0.50–1.10)
GFR calc Af Amer: >90 mL/min (ref >90)
GFR calc non Af Amer: 85 mL/min — ABNORMAL LOW (ref >90)
Glucose, Bld: 104 mg/dL — ABNORMAL HIGH (ref 70–99)
Potassium: 3.1 mEq/L — ABNORMAL LOW (ref 3.5–5.1)
Sodium: 138 mEq/L (ref 135–145)

## 2011-06-02 LAB — PRO B NATRIURETIC PEPTIDE: Pro B Natriuretic peptide (BNP): 317.6 pg/mL — ABNORMAL HIGH (ref 0–125)

## 2011-06-02 LAB — TROPONIN I: Troponin I: <0.3 ng/mL (ref <0.30)

## 2011-06-02 NOTE — ED Notes (Signed)
Pt reports pain and tightness in right chest for several days.  Reports mild nausea and SOB.  EKG completed, IV started, labs drawn.  Pt placed on monitor.

## 2011-06-02 NOTE — ED Provider Notes (Signed)
Scribed for Raeford Razor, MD, the patient was seen in room APA02/APA02 . This chart was scribed by Ellie Lunch.   CSN: [id number] Arrival date & time: 06/02/2011  6:45 PM   First MD Initiated Contact with Patient 06/02/11 1908      Chief Complaint  Patient presents with  . Chest Pain    (Consider location/radiation/quality/duration/timing/severity/associated sxs/prior treatment) HPI [name] is a 75 y.o. female who presents to the Emergency Department complaining of intermittent central/right sided chest pain for the past 3/4 days. Pt c/o associated SOB, fluttering heart beat, and swelling in feet. Pt says swelling in feet is not typical of her chest pains. Denies nausea. Each episode of chest pain lasts varying lengths of time. Nothing causes the pain to start. Nothing makes the pain worse or better. Pt has h/o of chest pain and heart issues. Pt also reports some chills. Pt says she took 325 mg of Asprin today.  Pt denies taking coumadin.  Pt denies fever.   PCP Dr. Margo Aye Cardiologist Dr. Diona Browner  Past Medical History  Diagnosis Date  . Atrial fibrillation   . CAD (coronary artery disease)     DES x 2 to RCA 10/10  . GERD (gastroesophageal reflux disease)   . Tachycardia-bradycardia syndrome     s/p Medtronic Adapta L dual chamber device  5/10  . Hyperlipidemia   . COPD (chronic obstructive pulmonary disease)   . Anxiety   . Resting tremor   . 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 Chronic back pain   . Pericardial effusion     Hemorrhagic   . Hypertension     Past Surgical History  Procedure Date  . Cholecystectomy   . Appendectomy   . Subxiphoid pericardial window 11/10  . Esophagogastroduodenoscopy with esophageal dilation 2004, 2006, 2007  . Vaginal hysterectomy   . Colonoscopy 2011  . Right rotator cuff repair     Family History  Problem Relation Age of Onset  . Cancer Mother     Colon   . Coronary artery disease Sister   .  Coronary artery disease Brother     History  Substance Use Topics  . Smoking status: Never Smoker   . Smokeless tobacco: Not on file  . Alcohol Use: No    Review of Systems  Constitutional: Positive for chills. Negative for fever.  Respiratory: Positive for shortness of breath.   Cardiovascular: Positive for chest pain, palpitations and leg swelling.  Gastrointestinal: Negative for nausea.  All other systems reviewed and are negative.    Allergies  Amitriptyline hcl and Sulfonamide derivatives  Home Medications   Current Outpatient Rx  Name Route Sig Dispense Refill  . ALPRAZOLAM 0.5 MG PO TABS Oral Take 0.5 mg by mouth as needed.      . AMIODARONE HCL 100 MG PO TABS Oral Take 1 tablet (100 mg total) by mouth daily. Decrease in dose 30 tablet 3  . ASPIRIN 325 MG PO TABS Oral Take 325 mg by mouth daily.      . B COMPLEX-B12 PO Oral Take by mouth.      Marland Kitchen BISACODYL 10 MG RE SUPP Rectal Place 10 mg rectally as needed.      Marland Kitchen CALCIUM-VITAMIN D 185 MG PO TABS Oral Take 185 mg by mouth daily.      Marland Kitchen VITAMIN D3 1000 UNITS PO CAPS Oral Take by mouth daily.      Marland Kitchen CLOPIDOGREL BISULFATE 75 MG PO TABS  Oral Take 1 tablet (75 mg total) by mouth daily. 30 tablet 3  . ESOMEPRAZOLE MAGNESIUM 40 MG PO CPDR Oral Take 40 mg by mouth every morning before breakfast.      . FUROSEMIDE 40 MG PO TABS Oral Take 40 mg by mouth as directed. Take 1 tablet am and 1/2 pm     . METOPROLOL SUCCINATE PO Oral Take 50 mg by mouth daily.      Marland Kitchen NITROGLYCERIN 0.4 MG SL SUBL Sublingual Place 0.4 mg under the tongue. Take as directed for chest pain     . FISH OIL 1000 MG PO CAPS Oral Take by mouth.      . ONDANSETRON HCL 4 MG PO TABS Oral Take 4 mg by mouth. As needed for nausea     . POTASSIUM CHLORIDE CRYS CR 20 MEQ PO TBCR Oral Take 20 mEq by mouth daily as needed.      Marland Kitchen PRIMIDONE 50 MG PO TABS  100 mg.     . ROSUVASTATIN CALCIUM 20 MG PO TABS Oral Take 20 mg by mouth daily.      Marland Kitchen VITAMIN E 400 UNITS PO CAPS  Oral Take 400 Units by mouth daily.        BP 144/82  Pulse 80  Temp(Src) 98 F (36.7 C) (Oral)  Resp 16  Ht 5' 5.5" (1.664 m)  Wt 150 lb (68.04 kg)  BMI 24.58 kg/m2  SpO2 98%  Physical Exam  Nursing note and vitals reviewed. Constitutional: She is oriented to person, place, and time. She appears well-developed and well-nourished. No distress.  HENT:  Head: Normocephalic and atraumatic.  Eyes: EOM are normal.  Neck: Normal range of motion.  Cardiovascular: Normal rate, regular rhythm and normal heart sounds.   No murmur heard. Pulmonary/Chest: Effort normal. No respiratory distress.       Crackles left base Pacer left chest wall.   Abdominal: Soft. She exhibits no distension. There is no tenderness.  Musculoskeletal: She exhibits edema (mild pitting lower extremity).  Neurological: She is alert and oriented to person, place, and time.  Skin: Skin is warm and dry.  Psychiatric: She has a normal mood and affect. Judgment normal.    ED Course  Procedures (including critical care time) OTHER DATA REVIEWED: Nursing notes, vital signs, and past medical records reviewed.   DIAGNOSTIC STUDIES: Oxygen Saturation is 98% on room air, normal by my interpretation.    Results for orders placed during the hospital encounter of 06/02/11  CBC      Component Value Range   WBC 5.6  4.0 - 10.5 (K/uL)   RBC 3.43 (*) 3.87 - 5.11 (MIL/uL)   Hemoglobin 10.4 (*) 12.0 - 15.0 (g/dL)   HCT 45.4 (*) 09.8 - 46.0 (%)   MCV 91.3  78.0 - 100.0 (fL)   MCH 30.3  26.0 - 34.0 (pg)   MCHC 33.2  30.0 - 36.0 (g/dL)   RDW 11.9  14.7 - 82.9 (%)   Platelets 262  150 - 400 (K/uL)  BASIC METABOLIC PANEL      Component Value Range   Sodium 138  135 - 145 (mEq/L)   Potassium 3.1 (*) 3.5 - 5.1 (mEq/L)   Chloride 100  96 - 112 (mEq/L)   CO2 29  19 - 32 (mEq/L)   Glucose, Bld 104 (*) 70 - 99 (mg/dL)   BUN 13  6 - 23 (mg/dL)   Creatinine, Ser 5.62  0.50 - 1.10 (mg/dL)   Calcium 9.8  8.4 - 10.5 (mg/dL)    GFR calc non Af Amer 85 (*) >90 (mL/min)   GFR calc Af Amer >90  >90 (mL/min)  TROPONIN I      Component Value Range   Troponin I <0.30  <0.30 (ng/mL)  PRO B NATRIURETIC PEPTIDE      Component Value Range   BNP, POC 317.6 (*) 0 - 125 (pg/mL)   EKG:  Rhythm: atrial pacing Rate: 80 Axis: normal Intervals: prolonged qt ST segments: flipped t-waves III as seen on previous. No significant changes.   Dg Chest 2 View  06/02/2011  *RADIOLOGY REPORT*  Clinical Data: Chest pain  CHEST - 2 VIEW  Comparison: 08/09/2010  Findings: Negative for heart failure.  Lungs are clear without infiltrate or effusion.  Pacemaker leads in the right atrium and right ventricle are unchanged.  Underlying COPD  IMPRESSION: No active cardiopulmonary disease.  Original Report Authenticated By: Camelia Phenes, M.D.    1. Chest pain   2. Dyspnea       MDM  75yf with CP and dyspnea. EKG nondiagnostic, troponin wnl. Exam and cxr not consistent with pulmonary edema. Consider PE but clinical suspicion not high enough to CT at this time. Pt not hypoxic or tachy. Given very significant heart hx, will admit for further eval.  I personally preformed the services scribed in my presence. The recorded information has been reviewed and considered. Raeford Razor, MD.          Raeford Razor, MD 06/03/11 [phone]

## 2011-06-02 NOTE — ED Notes (Signed)
Pt c/o pain in her right chest x 3-4 days. Pt also c/o shortness of breath, nausea and swelling in her feet.

## 2011-06-03 ENCOUNTER — Inpatient Hospital Stay (HOSPITAL_COMMUNITY): Payer: Medicare Other

## 2011-06-03 ENCOUNTER — Encounter (HOSPITAL_COMMUNITY): Payer: Self-pay | Admitting: General Practice

## 2011-06-03 DIAGNOSIS — D649 Anemia, unspecified: Secondary | ICD-10-CM | POA: Diagnosis present

## 2011-06-03 DIAGNOSIS — R079 Chest pain, unspecified: Secondary | ICD-10-CM

## 2011-06-03 DIAGNOSIS — E876 Hypokalemia: Secondary | ICD-10-CM | POA: Diagnosis present

## 2011-06-03 LAB — CARDIAC PANEL(CRET KIN+CKTOT+MB+TROPI)
CK, MB: 2 ng/mL (ref 0.3–4.0)
CK, MB: 2.1 ng/mL (ref 0.3–4.0)
CK, MB: 2 ng/mL (ref 0.3–4.0)
Total CK: 48 U/L (ref 7–177)
Troponin I: <0.3 ng/mL (ref <0.30)

## 2011-06-03 LAB — IRON AND TIBC: UIBC: 361 ug/dL (ref 125–400)

## 2011-06-03 LAB — GLUCOSE, CAPILLARY: Glucose-Capillary: 86 mg/dL (ref 70–99)

## 2011-06-03 LAB — CREATININE, SERUM
Creatinine, Ser: 0.67 mg/dL (ref 0.50–1.10)
GFR calc non Af Amer: 85 mL/min — ABNORMAL LOW (ref >90)

## 2011-06-03 LAB — BASIC METABOLIC PANEL
BUN: 12 mg/dL (ref 6–23)
Calcium: 9 mg/dL (ref 8.4–10.5)
GFR calc non Af Amer: 85 mL/min — ABNORMAL LOW (ref >90)
Glucose, Bld: 90 mg/dL (ref 70–99)
Sodium: 141 mEq/L (ref 135–145)

## 2011-06-03 LAB — CBC
Hemoglobin: 9.1 g/dL — ABNORMAL LOW (ref 12.0–15.0)
MCHC: 33 g/dL (ref 30.0–36.0)
WBC: 6.1 10*3/uL (ref 4.0–10.5)

## 2011-06-03 LAB — D-DIMER, QUANTITATIVE: D-Dimer, Quant: 0.7 ug/mL-FEU — ABNORMAL HIGH (ref 0.00–0.48)

## 2011-06-03 LAB — VITAMIN B12: Vitamin B-12: >2000 pg/mL — ABNORMAL HIGH (ref 211–911)

## 2011-06-03 LAB — TSH: TSH: 1.912 u[IU]/mL (ref 0.350–4.500)

## 2011-06-03 MED ORDER — ACETAMINOPHEN 650 MG RE SUPP: 650.0000 mg | Freq: Four times a day (QID) | RECTAL | Status: DC | PRN

## 2011-06-03 MED ORDER — ONDANSETRON HCL 4 MG/2ML IJ SOLN: 4.0000 mg | Freq: Four times a day (QID) | INTRAMUSCULAR | Status: DC | PRN

## 2011-06-03 MED ORDER — POTASSIUM CHLORIDE CRYS ER 20 MEQ PO TBCR: 20.0000 meq | EXTENDED_RELEASE_TABLET | Freq: Every day | ORAL | Status: DC

## 2011-06-03 MED ORDER — POTASSIUM CHLORIDE CRYS ER 20 MEQ PO TBCR: 40.0000 meq | EXTENDED_RELEASE_TABLET | Freq: Three times a day (TID) | ORAL | Status: DC

## 2011-06-03 MED ORDER — POTASSIUM CHLORIDE CRYS ER 20 MEQ PO TBCR: 40.0000 meq | EXTENDED_RELEASE_TABLET | Freq: Two times a day (BID) | ORAL | Status: DC

## 2011-06-03 MED ORDER — FUROSEMIDE 40 MG PO TABS
40.0000 mg | ORAL_TABLET | Freq: Every day | ORAL | Status: DC
  Administered 2011-06-03 – 2011-06-04 (×2): 40 mg via ORAL
  Filled 2011-06-03 (×2): qty 1

## 2011-06-03 MED ORDER — POTASSIUM CHLORIDE CRYS ER 20 MEQ PO TBCR
40.0000 meq | EXTENDED_RELEASE_TABLET | Freq: Two times a day (BID) | ORAL | Status: DC
  Administered 2011-06-03 – 2011-06-04 (×3): 40 meq via ORAL
  Filled 2011-06-03 (×3): qty 2

## 2011-06-03 MED ORDER — ACETAMINOPHEN 325 MG PO TABS
650.0000 mg | ORAL_TABLET | Freq: Four times a day (QID) | ORAL | Status: DC | PRN
  Administered 2011-06-03: 650 mg via ORAL
  Filled 2011-06-03: qty 2

## 2011-06-03 MED ORDER — ISOSORBIDE MONONITRATE ER 30 MG PO TB24
30.0000 mg | ORAL_TABLET | Freq: Every day | ORAL | Status: DC
  Administered 2011-06-03 – 2011-06-04 (×2): 30 mg via ORAL
  Filled 2011-06-03 (×2): qty 1

## 2011-06-03 MED ORDER — ALPRAZOLAM 0.25 MG PO TABS
0.2500 mg | ORAL_TABLET | Freq: Two times a day (BID) | ORAL | Status: DC | PRN
  Administered 2011-06-03: 0.5 mg via ORAL
  Filled 2011-06-03: qty 2

## 2011-06-03 MED ORDER — PRIMIDONE 50 MG PO TABS
100.0000 mg | ORAL_TABLET | Freq: Every day | ORAL | Status: DC
  Administered 2011-06-03: 100 mg via ORAL
  Filled 2011-06-03 (×3): qty 2

## 2011-06-03 MED ORDER — POTASSIUM CHLORIDE IN NACL 20-0.9 MEQ/L-% IV SOLN
INTRAVENOUS | Status: DC
  Administered 2011-06-04: 07:00:00 via INTRAVENOUS

## 2011-06-03 MED ORDER — HEPARIN SODIUM (PORCINE) 5000 UNIT/ML IJ SOLN
5000.0000 [IU] | Freq: Three times a day (TID) | INTRAMUSCULAR | Status: DC
  Administered 2011-06-03 – 2011-06-04 (×4): 5000 [IU] via SUBCUTANEOUS
  Filled 2011-06-03 (×4): qty 1

## 2011-06-03 MED ORDER — MORPHINE SULFATE 2 MG/ML IJ SOLN: 2.0000 mg | INTRAMUSCULAR | Status: DC | PRN

## 2011-06-03 MED ORDER — DOCUSATE SODIUM 100 MG PO CAPS
100.0000 mg | ORAL_CAPSULE | Freq: Two times a day (BID) | ORAL | Status: DC
  Administered 2011-06-03 – 2011-06-04 (×3): 100 mg via ORAL
  Filled 2011-06-03 (×3): qty 1

## 2011-06-03 MED ORDER — FUROSEMIDE 20 MG PO TABS
20.0000 mg | ORAL_TABLET | Freq: Every evening | ORAL | Status: DC
  Administered 2011-06-03: 20 mg via ORAL
  Filled 2011-06-03: qty 1

## 2011-06-03 MED ORDER — AMIODARONE HCL 200 MG PO TABS
100.0000 mg | ORAL_TABLET | Freq: Every day | ORAL | Status: DC
  Administered 2011-06-03 – 2011-06-04 (×2): 100 mg via ORAL
  Filled 2011-06-03 (×2): qty 1

## 2011-06-03 MED ORDER — METOPROLOL SUCCINATE ER 50 MG PO TB24
50.0000 mg | ORAL_TABLET | ORAL | Status: DC
  Administered 2011-06-03 – 2011-06-04 (×2): 50 mg via ORAL
  Filled 2011-06-03 (×2): qty 1

## 2011-06-03 MED ORDER — ASPIRIN 325 MG PO TABS
325.0000 mg | ORAL_TABLET | Freq: Every day | ORAL | Status: DC
  Administered 2011-06-03 – 2011-06-04 (×2): 325 mg via ORAL
  Filled 2011-06-03 (×2): qty 1

## 2011-06-03 MED ORDER — NITROGLYCERIN 0.4 MG SL SUBL: 0.4000 mg | SUBLINGUAL_TABLET | SUBLINGUAL | Status: DC | PRN

## 2011-06-03 MED ORDER — IOHEXOL 350 MG/ML SOLN
100.0000 mL | Freq: Once | INTRAVENOUS | Status: AC | PRN
  Administered 2011-06-03: 100 mL via INTRAVENOUS

## 2011-06-03 MED ORDER — POTASSIUM CHLORIDE 20 MEQ/15ML (10%) PO SOLN
ORAL | Status: AC
  Filled 2011-06-03: qty 30

## 2011-06-03 MED ORDER — ONDANSETRON HCL 4 MG PO TABS: 4.0000 mg | ORAL_TABLET | Freq: Four times a day (QID) | ORAL | Status: DC | PRN

## 2011-06-03 MED ORDER — SENNA 8.6 MG PO TABS: 2.0000 | ORAL_TABLET | Freq: Every day | ORAL | Status: DC | PRN

## 2011-06-03 MED ORDER — POTASSIUM CHLORIDE IN NACL 20-0.9 MEQ/L-% IV SOLN
Freq: Once | INTRAVENOUS | Status: AC
  Administered 2011-06-03: 04:00:00 via INTRAVENOUS

## 2011-06-03 MED ORDER — CLOPIDOGREL BISULFATE 75 MG PO TABS
75.0000 mg | ORAL_TABLET | Freq: Every day | ORAL | Status: DC
  Administered 2011-06-03 – 2011-06-04 (×2): 75 mg via ORAL
  Filled 2011-06-03 (×2): qty 1

## 2011-06-03 MED ORDER — ROSUVASTATIN CALCIUM 20 MG PO TABS
20.0000 mg | ORAL_TABLET | Freq: Every day | ORAL | Status: DC
  Administered 2011-06-03: 20 mg via ORAL
  Filled 2011-06-03: qty 1

## 2011-06-03 NOTE — H&P (Signed)
PCP:   Dwana Melena, MD   Chief Complaint:  R chest pain, palpitations, leg swelling, other various complaints   HPI: [name] is an 75 y.o. female with h/o DES x2 to RCA in 10/201, tachy-brady s/p PPM 12/2008, AFib not on Coumadin, [name] syndrome, COPD, anxiety who presents with various complaints of right sided chest pain, heart skipping beats and jumping in her chest, SOB, swelling in her legs, feeling like she's going to give out. Unclear time course of these symptoms, but it sounds like she saw her PCP for this not long ago and was told to come back in a couple weeks. History is difficult to gather because the patient is very tangential, goes off course with conversation and not very redirectable. Her speech seems very pressured. Symptoms are also not very specifically described. She does however endorse that she did not feel that her symptoms were due to her heart.   Comes to the ED where vitals were completely stable. W/u has been unrevealing including CXR, Troponin, EKG, CBC/chem. BNP is only minimally elevated. She is being admitted bc of her significant prior cardiac history. No interventions in the ED.   Past Medical History  Diagnosis Date  . Atrial fibrillation   . CAD (coronary artery disease)     DES x 2 to RCA 10/10  . GERD (gastroesophageal reflux disease)   . Tachycardia-bradycardia syndrome     s/p Medtronic Adapta L dual chamber device  5/10  . Hyperlipidemia   . COPD (chronic obstructive pulmonary disease)   . Anxiety   . Resting tremor   . 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 Chronic back pain   . Pericardial effusion     Hemorrhagic   . Hypertension    Central tremor, longstanding    Past Surgical History  Procedure Date  . Cholecystectomy   . Appendectomy   . Subxiphoid pericardial window 11/10  . Esophagogastroduodenoscopy with esophageal dilation 2004, 2006, 2007  . Vaginal hysterectomy   . Colonoscopy 2011  . Right rotator  cuff repair     Medications:  HOME MEDS: Prior to Admission medications   Medication Sig Start Date End Date Taking? Authorizing Provider  ALPRAZolam Prudy Feeler) 0.5 MG tablet Take 0.25-0.5 mg by mouth as needed. For anxiety   Yes Historical Provider, MD  aspirin 325 MG tablet Take 325 mg by mouth daily.     Yes Historical Provider, MD  B Complex Vitamins (B COMPLEX-B12 PO) Take 1 tablet by mouth daily.    Yes Historical Provider, MD  Cholecalciferol (VITAMIN D3) 1000 UNITS CAPS Take by mouth daily.     Yes Historical Provider, MD  clopidogrel (PLAVIX) 75 MG tablet Take 1 tablet (75 mg total) by mouth daily. 01/06/11  Yes Jonelle Sidle, MD  esomeprazole (NEXIUM) 40 MG capsule Take 40 mg by mouth every morning before breakfast.     Yes Historical Provider, MD  furosemide (LASIX) 40 MG tablet Take 40 mg by mouth as directed. Take 1 tablet am and 1/2 pm    Yes Historical Provider, MD  metoprolol (TOPROL-XL) 50 MG 24 hr tablet Take 50 mg by mouth every morning.     Yes Historical Provider, MD  nitroGLYCERIN (NITROSTAT) 0.4 MG SL tablet Place 0.4 mg under the tongue. Take as directed for chest pain    Yes Historical Provider, MD  Omega-3 Fatty Acids (FISH OIL) 1000 MG CAPS Take by mouth.     Yes Historical Provider,  MD  ondansetron (ZOFRAN) 4 MG tablet Take 4 mg by mouth. As needed for nausea    Yes Historical Provider, MD  potassium chloride SA (K-DUR,KLOR-CON) 20 MEQ tablet Take 20 mEq by mouth daily.    Yes Historical Provider, MD  primidone (MYSOLINE) 50 MG tablet Take 100 mg by mouth at bedtime.  10/31/10  Yes Historical Provider, MD  rosuvastatin (CRESTOR) 20 MG tablet Take 20 mg by mouth at bedtime.    Yes Historical Provider, MD  vitamin E 400 UNIT capsule Take 400 Units by mouth daily.     Yes Historical Provider, MD  amiodarone (PACERONE) 100 MG tablet Take 1 tablet (100 mg total) by mouth daily. Decrease in dose 03/25/11   Jonelle Sidle, MD  bisacodyl (DULCOLAX) 10 MG suppository Place  10 mg rectally as needed.     Historical Provider, MD  calcium-vitamin D 185 MG TABS Take 185 mg by mouth daily.      Historical Provider, MD     Allergies:  Allergies  Allergen Reactions  . Amitriptyline Hcl   . Sulfonamide Derivatives     Social History:   reports that she has never smoked. She does not have any smokeless tobacco history on file. She reports that she does not drink alcohol or use illicit drugs.  Family History: Family History  Problem Relation Age of Onset  . Cancer Mother     Colon   . Coronary artery disease Sister   . Coronary artery disease Brother     Rewiew of Systems:  Per HPI, also with subjective heat and chills, vomiting yesterday, abdominal pain. Denies orthopnea. O/w unremarkable.   Physical Exam: Filed Vitals:   06/02/11 1839 06/02/11 2351 06/03/11 0118  BP: 144/82 155/77 145/78  Pulse: 80 61 87  Temp: 98 F (36.7 C) 98.1 F (36.7 C)   TempSrc: Oral    Resp: 16 18 18   Height: 5' 5.5" (1.664 m)    Weight: 68.04 kg (150 lb)    SpO2: 98% 99% 96%   Blood pressure 145/78, pulse 87, temperature 98.1 F (36.7 C), temperature source Oral, resp. rate 18, height 5' 5.5" (1.664 m), weight 68.04 kg (150 lb), SpO2 96.00%.  Gen: Elderly lady with pressured speech, doesn't answer questions specifically but goes off on tangents. Does not appears distressed or in pain, speaks full sentences without SOB HEENT: PERRL, EOMI, sclera are clear, normal appearing, mouth moist, normal Lungs CTAB no w/c/r/r, no adventitious lung sounds Heart RRR, no m/g/r appreciated, benign exam Abd soft, non-peritoneal, non distended benign Extremities warm, well perfused, no cyanosis. There is really no edema that I can appreciate to which she refers, it is quite unimpressive leg exam. She has a course jerky upper extremity tremor that she states is long standing central tremor Neuro: alert, conversant, pleasant, moving extremities, able to sit up in stretcher and examine  her legs with me, moving extremities    Labs & Imaging Results for orders placed during the hospital encounter of 06/02/11 (from the past 48 hour(s))  CBC     Status: Abnormal   Collection Time   06/02/11  7:20 PM      Component Value Range Comment   WBC 5.6  4.0 - 10.5 (K/uL)    RBC 3.43 (*) 3.87 - 5.11 (MIL/uL)    Hemoglobin 10.4 (*) 12.0 - 15.0 (g/dL)    HCT 04.5 (*) 40.9 - 46.0 (%)    MCV 91.3  78.0 - 100.0 (fL)  MCH 30.3  26.0 - 34.0 (pg)    MCHC 33.2  30.0 - 36.0 (g/dL)    RDW 16.1  09.6 - 04.5 (%)    Platelets 262  150 - 400 (K/uL)   BASIC METABOLIC PANEL     Status: Abnormal   Collection Time   06/02/11  7:20 PM      Component Value Range Comment   Sodium 138  135 - 145 (mEq/L)    Potassium 3.1 (*) 3.5 - 5.1 (mEq/L)    Chloride 100  96 - 112 (mEq/L)    CO2 29  19 - 32 (mEq/L)    Glucose, Bld 104 (*) 70 - 99 (mg/dL)    BUN 13  6 - 23 (mg/dL)    Creatinine, Ser 4.09  0.50 - 1.10 (mg/dL)    Calcium 9.8  8.4 - 10.5 (mg/dL)    GFR calc non Af Amer 85 (*) >90 (mL/min)    GFR calc Af Amer >90  >90 (mL/min)   TROPONIN I     Status: Normal   Collection Time   06/02/11  7:20 PM      Component Value Range Comment   Troponin I <0.30  <0.30 (ng/mL)   PRO B NATRIURETIC PEPTIDE     Status: Abnormal   Collection Time   06/02/11  7:20 PM      Component Value Range Comment   BNP, POC 317.6 (*) 0 - 125 (pg/mL)    Dg Chest 2 View  06/02/2011  *RADIOLOGY REPORT*  Clinical Data: Chest pain  CHEST - 2 VIEW  Comparison: 08/09/2010  Findings: Negative for heart failure.  Lungs are clear without infiltrate or effusion.  Pacemaker leads in the right atrium and right ventricle are unchanged.  Underlying COPD  IMPRESSION: No active cardiopulmonary disease.  Original Report Authenticated By: Camelia Phenes, M.D.    Assessment Present on Admission:  .HYPERLIPIDEMIA .ANXIETY .Essential hypertension, benign .CORONARY ATHEROSCLEROSIS NATIVE CORONARY ARTERY .Atrial fibrillation .Cardiac  pacemaker in situ .Chest pain   PLAN: [name] is an 75 y.o. female with h/o DES x2 to RCA in 10/201, tachy-brady s/p PPM 12/2008, AFib not on Coumadin, [name] syndrome, COPD, anxiety who presents with various complaints of right sided chest pain, heart skipping beats and jumping in her chest, SOB, swelling in her legs, feeling like she's going to give out, abdominal pain, vomiting yesterday.   Despite her significant cardiac history, her w/u in the ED is benign so far. I think the correct diagnosis at this point may be anxiety, possibly somatization, possibly hypochondriasis. For example, pt complains of LE swelling but by exam are really not edematous in the very least (she does state they were worse previously though). Complaints of palpitations and her heart jumping, but by EKG is A-paced with appropriate ventricular response, in the 60's and otherwise unremarkable. Of note, pt has anxiety listed on PHx and takes Xanax.   However, given her history and other comorbidities, these would be diagnoses of exclusion and so we'll admit her for a ROMI with enzymes, and trend EKG's to ensure nothing evolving. I agree with the ED that given her low risk factors, lack of tachycardia or hypoxia, I think an PE is very low on the differential and would just monitor for now. COPD a consideration but CXR clear and lung sounds were not supportive of this.   We'll continue her home meds of ASA, Plavix, Amiodarone, Metoprolol, Crestor, PO Lasix. Continue home Xanax. Consider talking to her  PCP in the morning for background info.   Other plans as per orders.  Critical care time: 60 minutes.   Apple Dearmas 06/03/2011, 2:18 AM

## 2011-06-03 NOTE — Consult Note (Signed)
Clinical Summary [name] is a 75 y.o.female admitted to the hospital reporting a 2 week history of intermittent atypical, right-sided chest pain, a feeling of "quivering" in the center of her chest, fatigue with exertion, and generally feeling of malaise. This is on a baseline of intermittent, recurrent chest pain symptoms, and recent evaluation including a Myoview in August demonstrated no frank ischemia. Echocardiogram at that time also demonstrated normal left ventricular systolic function. She has a somewhat complex cardiac history, noted below.  She reports compliance with her medications. I last saw her in the office back in August.  She states that she has been worried about her functional limitations, just wants to "feel right."   Allergies  Allergen Reactions  . Amitriptyline Hcl   . Sulfonamide Derivatives     Prescriptions prior to admission  Medication Sig Dispense Refill  . ALPRAZolam (XANAX) 0.5 MG tablet Take 0.25-0.5 mg by mouth as needed. For anxiety      . aspirin 325 MG tablet Take 325 mg by mouth daily.        . B Complex Vitamins (B COMPLEX-B12 PO) Take 1 tablet by mouth daily.       . Cholecalciferol (VITAMIN D3) 1000 UNITS CAPS Take by mouth daily.        . clopidogrel (PLAVIX) 75 MG tablet Take 1 tablet (75 mg total) by mouth daily.  30 tablet  3  . esomeprazole (NEXIUM) 40 MG capsule Take 40 mg by mouth every morning before breakfast.        . furosemide (LASIX) 40 MG tablet Take 40 mg by mouth as directed. Take 1 tablet am and 1/2 pm       . metoprolol (TOPROL-XL) 50 MG 24 hr tablet Take 50 mg by mouth every morning.        . nitroGLYCERIN (NITROSTAT) 0.4 MG SL tablet Place 0.4 mg under the tongue. Take as directed for chest pain       . Omega-3 Fatty Acids (FISH OIL) 1000 MG CAPS Take by mouth.        . ondansetron (ZOFRAN) 4 MG tablet Take 4 mg by mouth. As needed for nausea       . potassium chloride SA (K-DUR,KLOR-CON) 20 MEQ tablet Take 20 mEq by mouth  daily.       . primidone (MYSOLINE) 50 MG tablet Take 100 mg by mouth at bedtime.       . rosuvastatin (CRESTOR) 20 MG tablet Take 20 mg by mouth at bedtime.       . vitamin E 400 UNIT capsule Take 400 Units by mouth daily.        Marland Kitchen amiodarone (PACERONE) 100 MG tablet Take 1 tablet (100 mg total) by mouth daily. Decrease in dose  30 tablet  3  . bisacodyl (DULCOLAX) 10 MG suppository Place 10 mg rectally as needed.       . calcium-vitamin D 185 MG TABS Take 185 mg by mouth daily.           Past Medical History  Diagnosis Date  . Atrial fibrillation   . CAD (coronary artery disease)     DES x 2 to RCA 10/10  . GERD (gastroesophageal reflux disease)   . Tachycardia-bradycardia syndrome     s/p Medtronic Adapta L dual chamber device  5/10  . Hyperlipidemia   . COPD (chronic obstructive pulmonary disease)   . Anxiety   . Resting tremor   . [name] syndrome  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   . Chronic back pain   . Pericardial effusion     Hemorrhagic   . Hypertension     Past Surgical History  Procedure Date  . Cholecystectomy   . Appendectomy   . Subxiphoid pericardial window 11/10  . Esophagogastroduodenoscopy with esophageal dilation 2004, 2006, 2007  . Vaginal hysterectomy   . Colonoscopy 2011  . Right rotator cuff repair     Family History  Problem Relation Age of Onset  . Cancer Mother     Colon   . Coronary artery disease Sister   . Coronary artery disease Brother     Social History [name] reports that she has never smoked. She does not have any smokeless tobacco history on file. [name] reports that she does not drink alcohol.  Review of Systems No cough or hemoptysis. No reported falls. Indicates somewhat early satiety, also reflux symptoms. No orthopnea or PND. Otherwise reviewed and negative except as outlined.  Physical Examination Temp:  [97.5 F (36.4 C)-98.1 F (36.7 C)] 97.6 F (36.4 C) (10/30 0545) Pulse Rate:  [61-87] 63  (10/30  0700) Resp:  [16-20] 20  (10/30 0545) BP: (109-155)/(62-82) 122/70 mmHg (10/30 0700) SpO2:  [96 %-100 %] 100 % (10/30 0545) FiO2 (%):  [2 %] 2 % (10/30 0306) Weight:  [128 lb 8.5 oz (58.3 kg)-150 lb (68.04 kg)] 128 lb 8.5 oz (58.3 kg) (10/30 0306)  Chronically ill-appearing woman in no acute distress.  HEENT: Conjunctiva and lids are normal, oropharynx clear.  Neck: Supple no elevated jugular venous pressure or bruits.  Lungs: Clear to auscultation with diminished breath sounds. Non-labored breathing at rest.  Cardiac: Regular rate and rhythm without S3 gallop. No pericardial rub or knock.  Abdomen: Soft, nontender, bowel sounds present  Extremities: Trace edema.  Skin: Warm and dry.  Musculoskeletal: Kyphosis noted.  Neuropsychiatric: Alert and oriented x3. Affect appropriate. Significant resting tremor, improves when she is resting undisturbed.   Testing Lab Results  Component Value Date   WBC 6.1 06/03/2011   HGB 9.1* 06/03/2011   HCT 27.6* 06/03/2011   MCV 91.7 06/03/2011   PLT 202 06/03/2011    Lab Results  Component Value Date   CREATININE 0.68 06/03/2011   BUN 12 06/03/2011   NA 141 06/03/2011   K 3.3* 06/03/2011   CL 104 06/03/2011   CO2 26 06/03/2011    Lab Results  Component Value Date   ALT 12 03/10/2011   AST 16 03/10/2011   ALKPHOS 76 03/10/2011   BILITOT 0.4 03/10/2011    Lab Results  Component Value Date   TSH 1.357 03/10/2011    Lab Results  Component Value Date   LDLCALC 100* 03/10/2011    Lab Results  Component Value Date   CKTOTAL 54 06/03/2011   CKMB 2.0 06/03/2011   TROPONINI <0.30 06/03/2011     ECG Reviewed showing atrial paced rhythm, nonspecific ST-T changes.  Imaging Chest x-ray from 10/29 shows no active pulmonary disease with clear lung fields, unchanged pacemaker position, COPD.  Impression  1. Atypical chest pain also associated with a "quivering" sensation in the chest, fatigue with exertion. ECG shows an atrial paced rhythm,  no definite atrial fibrillation, and cardiac markers argue against an acute coronary syndrome. Recent Myoview from August showed no definite ischemia with preserved LVEF. She continues on medical therapy with history of CAD.  2. Coronary artery disease status post drug-eluting stent placement to the RCA in October 2010, remains on DAPT.  3. Tachycardia-bradycardia syndrome status post Medtronic dual-chamber pacemaker. Has atrial paced rhythm at this time.  4. History of atrial fibrillation, maintaining sinus rhythm on low-dose amiodarone. She has not been on Coumadin with prior history of hemorrhagic pericardial effusion. Recent echocardiogram from August showed normal LVEF and no recurrent pericardial effusion.  5. Resting tremor, followed by [name].  6. Anemia, progressive. Question GI source, particularly with symptoms of early satiety and reflux.  Recommendations  Would continue outpatient cardiac regimen, and add Imdur as anti-anginal, although symptoms are fairly atypical. Would also guaiac stools and consider GI evaluation if anemia progresses. Recommend continuing proton pump inhibitor. DAPT will be continued for now. Not certain that further cardiac testing will be pursued at this point, unless her symptoms progress without other obvious diagnosis. Repeat invasive cardiac evaluation could be considered in that case. We will follow with you.

## 2011-06-03 NOTE — Progress Notes (Signed)
The patient is a 75 year old woman with a past medical history significant for atrial fibrillation, coronary artery disease, tachybradycardia syndrome, and status post pacemaker. She was admitted to the hospital early this morning with a chief complaint of right-sided chest pain, a quivering sensation in her chest, shortness of breath, and palpitations. She was briefly seen. Her chart, vitals, and labs were reviewed. Cardiology was consulted. Dr. Ival Bible assessment is acknowledged. A d-dimer was ordered. It was elevated. A CT angiogram of her chest followed to rule out PE. It was negative for PE. Her cardiac enzymes so far have been within normal limits. She is noted to be anemic. Anemia studies have been ordered. Potassium chloride has been ordered for potassium supplementation. Additional laboratory studies will be ordered in the morning.

## 2011-06-04 LAB — BASIC METABOLIC PANEL
BUN: 11 mg/dL (ref 6–23)
Calcium: 9.3 mg/dL (ref 8.4–10.5)
Creatinine, Ser: 0.63 mg/dL (ref 0.50–1.10)
GFR calc Af Amer: >90 mL/min (ref >90)
GFR calc non Af Amer: 87 mL/min — ABNORMAL LOW (ref >90)

## 2011-06-04 LAB — CBC
MCHC: 32.1 g/dL (ref 30.0–36.0)
Platelets: 208 10*3/uL (ref 150–400)
RDW: 14.4 % (ref 11.5–15.5)
WBC: 4.5 10*3/uL (ref 4.0–10.5)

## 2011-06-04 LAB — MAGNESIUM: Magnesium: 2 mg/dL (ref 1.5–2.5)

## 2011-06-04 MED ORDER — FERROUS SULFATE 325 (65 FE) MG PO TABS: 325.0000 mg | ORAL_TABLET | Freq: Every day | ORAL | Status: DC

## 2011-06-04 MED ORDER — ISOSORBIDE MONONITRATE ER 30 MG PO TB24: 30.0000 mg | ORAL_TABLET | Freq: Every day | ORAL | Status: DC

## 2011-06-04 MED ORDER — POTASSIUM CHLORIDE CRYS ER 20 MEQ PO TBCR: 20.0000 meq | EXTENDED_RELEASE_TABLET | Freq: Two times a day (BID) | ORAL | Status: DC

## 2011-06-04 NOTE — Progress Notes (Signed)
Pt discharged home via family; Pt and family given and explained all discharge instructions, carenotes, and prescriptions; pt and family stated understanding and denied questions/concerns; all f/u appointments in place; IV removed without complicaitons; pt stable at time of discharge  

## 2011-06-04 NOTE — Progress Notes (Signed)
Subjective:  Patient complains of wheezing today when she woke up or also scratch in her throat she's not sure which. She has a chronic right-sided ache in her chest that is not too bothersome.  Objective:  Vital Signs in the last 24 hours: Temp:  [97.4 F (36.3 C)-97.6 F (36.4 C)] 97.6 F (36.4 C) (10/31 0646) Pulse Rate:  [61-63] 61  (10/31 0646) Resp:  [20] 20  (10/31 0646) BP: (115-128)/(65-68) 125/68 mmHg (10/31 0646) SpO2:  [96 %-97 %] 97 % (10/31 0646)  Intake/Output from previous day: 10/30 0701 - 10/31 0700 In: 720 [P.O.:720] Out: -  Intake/Output from this shift:    Physical Exam: NECK: Without JVD, HJR, or bruit LUNGS:Decreased breath sounds but clear anterior, posterior, lateral HEART: Regular rate and rhythm, positive S4 with 2/6 systolic murmur at the left sternal border, no rub, bruit, thrill, or heave EXTREMITIES: Without cyanosis, clubbing, or edema   Lab Results:  [facility] 06/04/11 0516 06/03/11 0417  WBC 4.5 6.1  HGB 8.9* 9.1*  PLT 208 202    Basename 06/04/11 0516 06/03/11 0426  NA 141 141  K 4.0 3.3*  CL 107 104  CO2 27 26  GLUCOSE 84 90  BUN 11 12  CREATININE 0.63 0.68    Basename 06/03/11 2206 06/03/11 1115  TROPONINI <0.30 <0.30   Hepatic Function Panel No results found for this basename: PROT,ALBUMIN,AST,ALT,ALKPHOS,BILITOT,BILIDIR,IBILI in the last 72 hours No results found for this basename: CHOL in the last 72 hours No results found for this basename: PROTIME in the last 72 hours   Imaging  Chest x-ray from 10/29 shows no active pulmonary disease with clear lung fields, unchanged pacemaker position, COPD.  Impression  1. Atypical chest pain also associated with a "quivering" sensation in the chest, fatigue with exertion. ECG shows an atrial paced rhythm, no definite atrial fibrillation, and cardiac markers argue against an acute coronary syndrome. Recent Myoview from August showed no definite ischemia with preserved LVEF. She  continues on medical therapy with history of CAD. Imdur added yesterday. May be discharged and follow-up with Dr. Diona Browner November 28. 2. Coronary artery disease status post drug-eluting stent placement to the RCA in October 2010, remains on DAPT.  3. Tachycardia-bradycardia syndrome status post Med[employer]onic dual-chamber pacemaker. Has atrial paced rhythm at this time.  4. History of atrial fibrillation, maintaining sinus rhythm on low-dose amiodarone. She has not been on Coumadin with prior history of hemorrhagic pericardial effusion. Recent echocardiogram from August showed normal LVEF and no recurrent pericardial effusion.  5. Resting tremor, followed by [name].   LOS: 2 days    Jacolyn Reedy 06/04/2011, 8:31 AM

## 2011-06-04 NOTE — Discharge Summary (Signed)
Physician Discharge Summary  [name] MRN: [id number] DOB/AGE: [id number] 73 y.o.  PCP: Dwana Melena, MD   Admit date: 06/02/2011 Discharge date: 06/04/2011  Discharge Diagnoses:  1. Chest pain and palpitations. Myocardial infarction ruled out. CT angiogram of the chest negative for PE. 2. Hypokalemia. 3. Iron deficiency anemia. Her total iron was within normal limits at 52, TIBC was within normal limits at 413, ferritin was low at 12, and vitamin B12 was greater than 2000. Her hemoglobin was 8.9 prior to discharge. 4. Coronary artery disease, status post drug eluting stent x2 to the right coronary artery in October 2010. Myoview stress test from August of 2012 revealed no ischemia and with preserved LV function. 5. History of tachybradycardia syndrome, status post dual-chamber pacemaker in May of 2010. 6. Chronic nature fibrillation, not on Coumadin secondary to hemorrhagic pericardial effusion in the past. 7. Hypertension, remained stable. 8. COPD, remained stable. 9. Chronic familial tremor. 10. Chronic anxiety.   Current Discharge Medication List    START taking these medications   Details  ferrous sulfate 325 (65 FE) MG tablet Take 1 tablet (325 mg total) by mouth daily with breakfast. IRON SUPPLEMENT FOR ANEMIA.    isosorbide mononitrate (IMDUR) 30 MG 24 hr tablet Take 1 tablet (30 mg total) by mouth daily. Qty: 30 tablet, Refills: 2   Associated Diagnoses: Chest pain; Coronary atherosclerosis of native coronary artery      CONTINUE these medications which have CHANGED   Details  potassium chloride SA (K-DUR,KLOR-CON) 20 MEQ tablet Take 1 tablet (20 mEq total) by mouth 2 (two) times daily. THE DOSE WAS INCREASED TO 1 TABLET TWO TIMES DAILY. Qty: 60 tablet, Refills: 2      CONTINUE these medications which have NOT CHANGED   Details  ALPRAZolam (XANAX) 0.5 MG tablet Take 0.25-0.5 mg by mouth as needed. For anxiety    aspirin 325 MG tablet Take 325 mg by mouth  daily.      B Complex Vitamins (B COMPLEX-B12 PO) Take 1 tablet by mouth daily.     Cholecalciferol (VITAMIN D3) 1000 UNITS CAPS Take by mouth daily.      clopidogrel (PLAVIX) 75 MG tablet Take 1 tablet (75 mg total) by mouth daily. Qty: 30 tablet, Refills: 3    esomeprazole (NEXIUM) 40 MG capsule Take 40 mg by mouth every morning before breakfast.      furosemide (LASIX) 40 MG tablet Take 40 mg by mouth as directed. Take 1 tablet am and 1/2 pm     metoprolol (TOPROL-XL) 50 MG 24 hr tablet Take 50 mg by mouth every morning.      nitroGLYCERIN (NITROSTAT) 0.4 MG SL tablet Place 0.4 mg under the tongue. Take as directed for chest pain     Omega-3 Fatty Acids (FISH OIL) 1000 MG CAPS Take by mouth.      ondansetron (ZOFRAN) 4 MG tablet Take 4 mg by mouth. As needed for nausea     primidone (MYSOLINE) 50 MG tablet Take 100 mg by mouth at bedtime.     rosuvastatin (CRESTOR) 20 MG tablet Take 20 mg by mouth at bedtime.     vitamin E 400 UNIT capsule Take 400 Units by mouth daily.      amiodarone (PACERONE) 100 MG tablet Take 1 tablet (100 mg total) by mouth daily. Decrease in dose Qty: 30 tablet, Refills: 3      STOP taking these medications     bisacodyl (DULCOLAX) 10 MG suppository  calcium-vitamin D 185 MG TABS         Discharge Condition: Improved and stable.  Disposition:  Home.   Consults: Nona Dell, M.D.   Significant Diagnostic Studies: Dg Chest 2 View  06/02/2011  *RADIOLOGY REPORT*  Clinical Data: Chest pain  CHEST - 2 VIEW  Comparison: 08/09/2010  Findings: Negative for heart failure.  Lungs are clear without infiltrate or effusion.  Pacemaker leads in the right atrium and right ventricle are unchanged.  Underlying COPD  IMPRESSION: No active cardiopulmonary disease.  Original Report Authenticated By: Camelia Phenes, M.D.   Ct Angio Chest W/cm &/or Wo Cm  06/03/2011  *RADIOLOGY REPORT*  Clinical Data:  Shortness of breath, evaluate for PE  CT  ANGIOGRAPHY CHEST WITH CONTRAST  Technique:  Multidetector CT imaging of the chest was performed using the standard protocol during bolus administration of intravenous contrast.  Multiplanar CT image reconstructions including MIPs were obtained to evaluate the vascular anatomy.  Contrast: OMNIPAQUE IOHEXOL 350 MG/ML IV SOLN  Comparison:  Chest radiographs dated 06/02/2011.  CT chest dated 12/07/2009.  Findings:  No evidence of pulmonary embolism.  Mild paraseptal emphysematous changes.  Biapical pleural parenchymal scarring.  No suspicious pulmonary nodules. No pleural effusion or pneumothorax.  Thyroid is enlarged/mildly heterogeneous.  The heart is top normal and sinus.  Coronary atherosclerosis. Atherosclerotic calcifications of the aortic arch.  No suspicious mediastinal, hilar, or axillary lymphadenopathy.  Visualized upper abdomen is notable for calcified splenic granulomata and cholecystectomy clips.  Mild degenerative changes of the visualized thoracolumbar spine.  Review of the MIP images confirms the above findings.  IMPRESSION: No evidence of pulmonary embolism.  Mild paraseptal emphysematous changes.  Original Report Authenticated By: Charline Bills, M.D.     Microbiology: No results found for this or any previous visit (from the past 240 hour(s)).   Labs: Results for orders placed during the hospital encounter of 06/02/11 (from the past 48 hour(s))  CBC     Status: Abnormal   Collection Time   06/02/11  7:20 PM      Component Value Range Comment   WBC 5.6  4.0 - 10.5 (K/uL)    RBC 3.43 (*) 3.87 - 5.11 (MIL/uL)    Hemoglobin 10.4 (*) 12.0 - 15.0 (g/dL)    HCT 16.1 (*) 09.6 - 46.0 (%)    MCV 91.3  78.0 - 100.0 (fL)    MCH 30.3  26.0 - 34.0 (pg)    MCHC 33.2  30.0 - 36.0 (g/dL)    RDW 04.5  40.9 - 81.1 (%)    Platelets 262  150 - 400 (K/uL)   BASIC METABOLIC PANEL     Status: Abnormal   Collection Time   06/02/11  7:20 PM      Component Value Range Comment   Sodium 138   135 - 145 (mEq/L)    Potassium 3.1 (*) 3.5 - 5.1 (mEq/L)    Chloride 100  96 - 112 (mEq/L)    CO2 29  19 - 32 (mEq/L)    Glucose, Bld 104 (*) 70 - 99 (mg/dL)    BUN 13  6 - 23 (mg/dL)    Creatinine, Ser 9.14  0.50 - 1.10 (mg/dL)    Calcium 9.8  8.4 - 10.5 (mg/dL)    GFR calc non Af Amer 85 (*) >90 (mL/min)    GFR calc Af Amer >90  >90 (mL/min)   TROPONIN I     Status: Normal   Collection Time  06/02/11  7:20 PM      Component Value Range Comment   Troponin I <0.30  <0.30 (ng/mL)   PRO B NATRIURETIC PEPTIDE     Status: Abnormal   Collection Time   06/02/11  7:20 PM      Component Value Range Comment   BNP, POC 317.6 (*) 0 - 125 (pg/mL)   CBC     Status: Abnormal   Collection Time   06/03/11  4:17 AM      Component Value Range Comment   WBC 6.1  4.0 - 10.5 (K/uL)    RBC 3.01 (*) 3.87 - 5.11 (MIL/uL)    Hemoglobin 9.1 (*) 12.0 - 15.0 (g/dL)    HCT 96.0 (*) 45.4 - 46.0 (%)    MCV 91.7  78.0 - 100.0 (fL)    MCH 30.2  26.0 - 34.0 (pg)    MCHC 33.0  30.0 - 36.0 (g/dL)    RDW 09.8  11.9 - 14.7 (%)    Platelets 202  150 - 400 (K/uL)   CREATININE, SERUM     Status: Abnormal   Collection Time   06/03/11  4:17 AM      Component Value Range Comment   Creatinine, Ser 0.67  0.50 - 1.10 (mg/dL)    GFR calc non Af Amer 85 (*) >90 (mL/min)    GFR calc Af Amer >90  >90 (mL/min)   CARDIAC PANEL(CRET KIN+CKTOT+MB+TROPI)     Status: Normal   Collection Time   06/03/11  4:22 AM      Component Value Range Comment   Total CK 54  7 - 177 (U/L)    CK, MB 2.0  0.3 - 4.0 (ng/mL)    Troponin I <0.30  <0.30 (ng/mL)    Relative Index RELATIVE INDEX IS INVALID  0.0 - 2.5    BASIC METABOLIC PANEL     Status: Abnormal   Collection Time   06/03/11  4:26 AM      Component Value Range Comment   Sodium 141  135 - 145 (mEq/L)    Potassium 3.3 (*) 3.5 - 5.1 (mEq/L)    Chloride 104  96 - 112 (mEq/L)    CO2 26  19 - 32 (mEq/L)    Glucose, Bld 90  70 - 99 (mg/dL)    BUN 12  6 - 23 (mg/dL)     Creatinine, Ser 8.29  0.50 - 1.10 (mg/dL)    Calcium 9.0  8.4 - 10.5 (mg/dL)    GFR calc non Af Amer 85 (*) >90 (mL/min)    GFR calc Af Amer >90  >90 (mL/min)   CARDIAC PANEL(CRET KIN+CKTOT+MB+TROPI)     Status: Normal   Collection Time   06/03/11 11:15 AM      Component Value Range Comment   Total CK 61  7 - 177 (U/L)    CK, MB 2.1  0.3 - 4.0 (ng/mL)    Troponin I <0.30  <0.30 (ng/mL)    Relative Index RELATIVE INDEX IS INVALID  0.0 - 2.5    MAGNESIUM     Status: Normal   Collection Time   06/03/11 11:16 AM      Component Value Range Comment   Magnesium 2.1  1.5 - 2.5 (mg/dL)   FERRITIN     Status: Normal   Collection Time   06/03/11 11:16 AM      Component Value Range Comment   Ferritin 12  10 - 291 (ng/mL)   IRON AND TIBC  Status: Abnormal   Collection Time   06/03/11 11:16 AM      Component Value Range Comment   Iron 52  42 - 135 (ug/dL)    TIBC 409  811 - 914 (ug/dL)    Saturation Ratios 13 (*) 20 - 55 (%)    UIBC 361  125 - 400 (ug/dL)   VITAMIN N82     Status: Abnormal   Collection Time   06/03/11 11:16 AM      Component Value Range Comment   Vitamin B-12 >2000 (*) 211 - 911 (pg/mL)   TSH     Status: Normal   Collection Time   06/03/11 11:16 AM      Component Value Range Comment   TSH 1.912  0.350 - 4.500 (uIU/mL)   D-DIMER, QUANTITATIVE     Status: Abnormal   Collection Time   06/03/11 11:16 AM      Component Value Range Comment   D-Dimer, Quant 0.70 (*) 0.00 - 0.48 (ug/mL-FEU)   CARDIAC PANEL(CRET KIN+CKTOT+MB+TROPI)     Status: Normal   Collection Time   06/03/11 10:06 PM      Component Value Range Comment   Total CK 48  7 - 177 (U/L)    CK, MB 2.0  0.3 - 4.0 (ng/mL)    Troponin I <0.30  <0.30 (ng/mL)    Relative Index RELATIVE INDEX IS INVALID  0.0 - 2.5    GLUCOSE, CAPILLARY     Status: Normal   Collection Time   06/03/11 11:33 PM      Component Value Range Comment   Glucose-Capillary 86  70 - 99 (mg/dL)   BASIC METABOLIC PANEL     Status:  Abnormal   Collection Time   06/04/11  5:16 AM      Component Value Range Comment   Sodium 141  135 - 145 (mEq/L)    Potassium 4.0  3.5 - 5.1 (mEq/L) DELTA CHECK NOTED   Chloride 107  96 - 112 (mEq/L)    CO2 27  19 - 32 (mEq/L)    Glucose, Bld 84  70 - 99 (mg/dL)    BUN 11  6 - 23 (mg/dL)    Creatinine, Ser 9.56  0.50 - 1.10 (mg/dL)    Calcium 9.3  8.4 - 10.5 (mg/dL)    GFR calc non Af Amer 87 (*) >90 (mL/min)    GFR calc Af Amer >90  >90 (mL/min)   CBC     Status: Abnormal   Collection Time   06/04/11  5:16 AM      Component Value Range Comment   WBC 4.5  4.0 - 10.5 (K/uL)    RBC 3.01 (*) 3.87 - 5.11 (MIL/uL)    Hemoglobin 8.9 (*) 12.0 - 15.0 (g/dL)    HCT 21.3 (*) 08.6 - 46.0 (%)    MCV 92.0  78.0 - 100.0 (fL)    MCH 29.6  26.0 - 34.0 (pg)    MCHC 32.1  30.0 - 36.0 (g/dL)    RDW 57.8  46.9 - 62.9 (%)    Platelets 208  150 - 400 (K/uL)   MAGNESIUM     Status: Normal   Collection Time   06/04/11  5:16 AM      Component Value Range Comment   Magnesium 2.0  1.5 - 2.5 (mg/dL)      HPI : The patient is a 75 year old woman with a past medical history significant for coronary artery disease, tachybradycardia syndrome, and chronic  atrial fibrillation. She presented to the emergency department on 06/03/2011 with a chief complaint of right-sided chest pain, palpitations, and various other complaints. Her chest x-ray, EKG, and initial laboratory studies were unremarkable with the exception of a serum potassium of 3.1. She was admitted for further evaluation and management.  HOSPITAL COURSE: The patient was admitted to a telemetry bed. Her potassium chloride was supplemented orally. Her blood magnesium level was assessed and it was within normal limits at 2.0. Her pain was treated with analgesics as needed. She was maintained on all of her chronic medications, including proton pump inhibitor therapy. For further evaluation, a number of studies were ordered. Her cardiac enzymes were all  within normal limits. Her d-dimer was elevated and therefore, a CT angiogram of her chest was ordered to assess for pulmonary embolism. It revealed no pulmonary embolism but it did reveal changes consistent with emphysema. Her TSH was within normal limits at 1.9.  Given her past cardiac history, cardiologist Dr. Diona Browner, was consulted. He evaluated the patient. He recommended continuing management as previously prescribed. He was not certain that further cardiac testing needed to be pursued given her symptoms were certainly atypical for angina. Nevertheless, he did start Imdur  empirically to see if it would ameliorate some of her symptoms. If she continued to have progression of her symptoms, he would consider repeating in the invasive testing on an outpatient basis.  The patient was noted to be anemic. Anemia studies were ordered, and they were dictated above. She was started on ferrous sulfate. She was given a followup appointment with [name] for further evaluation of anemia. Apparently she had a colonoscopy in 2011, but she did not remember the results.   The patient remained hemodynamically stable and afebrile. She was chest pain-free at the time of discharge. She was advised to increase potassium chloride supplementation from 20 mEq once daily to twice a day.    Discharge Exam:  Blood pressure 125/68, pulse 61, temperature 97.6 F (36.4 C), temperature source Oral, resp. rate 20, height 5\' 5"  (1.651 m), weight 58.3 kg (128 lb 8.5 oz), SpO2 97.00%. Lungs: Clear to auscultation with no evidence of wheezes or crackles. Heart: S1, S2, with no murmurs rubs or gallops. Abdomen: Positive bowel sounds, soft, nontender, nondistended. Extremities: No pedal edema.    Discharge Orders    Future Appointments: Provider: Department: Dept Phone: Center:   06/10/2011 8:45 AM Fuller Canada, MD [facility] [phone] [facility]   06/23/2011 3:30 PM Llana Aliment, NP [facility]  [phone] None   07/02/2011 8:20 AM Jonelle Sidle, MD [facility] [phone] [id number]     Future Orders Please Complete By Expires   Diet - low sodium heart healthy      Increase activity slowly      Discharge instructions      Comments:   FOLLOW UP WITH YOUR DOCTORS AS SCHEDULED.      Follow-up Information    Follow up with HALL,ZACK on 06/11/2011. (AT 11:30 AM)    Contact information:   [address] [place] [address] [phone]       Follow up with Malissa Hippo, MD ([facility]) on 06/23/2011. (AT 3:30 PM)    Contact information:   [address], [address] [place] [address] [phone]       Follow up with Nona Dell, MD. (FOLLOW UP AS SCHEDULED)    Contact information:   [address] [employer]  [address] [place] [address] [phone]          Signed: Jeron Grahn 06/04/2011, 11:30 AM

## 2011-06-04 NOTE — Progress Notes (Signed)
Patient has ruled out for myocardial infarction. She is being discharged home today by the hospitalist service. Plan to continue medical therapy including recent addition of Imdur. Office follow up already arranged.

## 2011-06-10 ENCOUNTER — Ambulatory Visit: Payer: Medicare Other | Admitting: Orthopedic Surgery

## 2011-06-20 ENCOUNTER — Encounter: Payer: Self-pay | Admitting: Cardiology

## 2011-06-23 ENCOUNTER — Ambulatory Visit (INDEPENDENT_AMBULATORY_CARE_PROVIDER_SITE_OTHER): Payer: Medicare Other | Admitting: Cardiology

## 2011-06-23 ENCOUNTER — Ambulatory Visit (INDEPENDENT_AMBULATORY_CARE_PROVIDER_SITE_OTHER): Payer: MEDICARE | Admitting: Internal Medicine

## 2011-06-23 ENCOUNTER — Encounter: Payer: Self-pay | Admitting: Cardiology

## 2011-06-23 ENCOUNTER — Encounter (INDEPENDENT_AMBULATORY_CARE_PROVIDER_SITE_OTHER): Payer: Self-pay | Admitting: Internal Medicine

## 2011-06-23 VITALS — BP 150/83 | HR 83 | Resp 16 | Ht 66.0 in | Wt 159.0 lb

## 2011-06-23 DIAGNOSIS — I2510 Atherosclerotic heart disease of native coronary artery without angina pectoris: Secondary | ICD-10-CM

## 2011-06-23 DIAGNOSIS — I4891 Unspecified atrial fibrillation: Secondary | ICD-10-CM

## 2011-06-23 DIAGNOSIS — I495 Sick sinus syndrome: Secondary | ICD-10-CM

## 2011-06-23 DIAGNOSIS — R1310 Dysphagia, unspecified: Secondary | ICD-10-CM

## 2011-06-23 DIAGNOSIS — R1314 Dysphagia, pharyngoesophageal phase: Secondary | ICD-10-CM

## 2011-06-23 DIAGNOSIS — D649 Anemia, unspecified: Secondary | ICD-10-CM

## 2011-06-23 NOTE — Assessment & Plan Note (Signed)
Status post pacemaker placement. ?

## 2011-06-23 NOTE — Patient Instructions (Signed)
**Note De-identified  Obfuscation** Your physician recommends that you continue on your current medications as directed. Please refer to the Current Medication list given to you today.  Your physician recommends that you schedule a follow-up appointment in: 3 MONTHS  

## 2011-06-23 NOTE — Progress Notes (Signed)
Subjective:     Patient ID: [name], female   DOB: 09-17-1935, 75 y.o.   MRN: [id number]  HPI  [name] is a 75 yr old female presenting today for a scheduled visit. She is a patient of Dr. Dwana Melena. She was last seen in our office in October of 2011. She tells me she was recently in the hospital about 2 weeks ago.  She had swelling in her legs and her potassium was low. She was also found to have iron deficiency anemia.  She thinks she may have seen some blood in her stool a couple of weeks ago.  She says she is having trouble swallowing. She has trouble swallowing meats. She avoids meats. It feels like foods are lodging in her esophagus.  She was seen at [facility] for evaluation of dysphagia last year for evaluation of dysphagia and had an esophageal manometry revealing normal LES resting pressure of 30.9.  Relaxation was incomplete at 19.9 normal being less than 15.  30% of the swallows were simultaneous, one failed and 60% were peristaltic. Impedence study revealed incomplete fullness clear at 20%.  He UES was hypertensive. She had some double peak contractions. She was diagnosed with diffuse esophageal spasm and LE dysfunction and Dr. Adriana Simas recommended using nitrates or calcium channel blockers.  There has been no weight loss. She clo bloating in her abdomen. She has a BM about every day.  Last EGD was 04/02/2009: Small sliding hiatal hernia, otherwise normal EGD. Esophageal dilatation with a 56 French Maloney dilator resulting in small linear tear at  The cervical esophagus indicative of a web.  06/30/2009 Hemoglobin  10.8 08/2009 Hemoglobin 10.9 06/02/2011 Hemoglobin 10.4  06/04/2011 H and H 8.9 and 27.2, MCV 92.0, Vitamin B12 greater than 2000, Ferritin 12. Her last colonoscopy was in 05/2010 which revealed external hemorrhoids, otherwise normal colonoscopy.    Review of Systems see hpi  Current Outpatient Prescriptions  Medication Sig Dispense Refill  . ALPRAZolam (XANAX) 0.5 MG tablet Take  0.25-0.5 mg by mouth as needed. For anxiety      . amiodarone (PACERONE) 100 MG tablet Take 1 tablet (100 mg total) by mouth daily. Decrease in dose  30 tablet  3  . aspirin 325 MG tablet Take 325 mg by mouth daily.        . B Complex Vitamins (B COMPLEX-B12 PO) Take 1 tablet by mouth daily.       . Cholecalciferol (VITAMIN D3) 1000 UNITS CAPS Take by mouth daily.        . clopidogrel (PLAVIX) 75 MG tablet Take 1 tablet (75 mg total) by mouth daily.  30 tablet  3  . esomeprazole (NEXIUM) 40 MG capsule Take 40 mg by mouth every morning before breakfast.        . ferrous sulfate 325 (65 FE) MG tablet Take 1 tablet (325 mg total) by mouth daily with breakfast. IRON SUPPLEMENT FOR ANEMIA.      . furosemide (LASIX) 40 MG tablet Take 40 mg by mouth as directed. Take 1 tablet am and 1/2 pm       . isosorbide mononitrate (IMDUR) 30 MG 24 hr tablet Take 1 tablet (30 mg total) by mouth daily.  30 tablet  2  . metoprolol (TOPROL-XL) 50 MG 24 hr tablet Take 50 mg by mouth every morning.        . nitroGLYCERIN (NITROSTAT) 0.4 MG SL tablet Place 0.4 mg under the tongue. Take as directed for chest pain       .  Omega-3 Fatty Acids (FISH OIL) 1000 MG CAPS Take by mouth.        . ondansetron (ZOFRAN) 4 MG tablet Take 4 mg by mouth. As needed for nausea       . potassium chloride SA (K-DUR,KLOR-CON) 20 MEQ tablet Take 1 tablet (20 mEq total) by mouth 2 (two) times daily. THE DOSE WAS INCREASED TO 1 TABLET TWO TIMES DAILY.  60 tablet  2  . primidone (MYSOLINE) 50 MG tablet Take 100 mg by mouth at bedtime.       . rosuvastatin (CRESTOR) 20 MG tablet Take 20 mg by mouth at bedtime.       . vitamin E 400 UNIT capsule Take 400 Units by mouth daily.         Past Surgical History  Procedure Date  . Cholecystectomy   . Appendectomy   . Subxiphoid pericardial window 11/10  . Esophagogastroduodenoscopy with esophageal dilation 2004, 2006, 2007  . Vaginal hysterectomy   . Colonoscopy 2011  . Right rotator cuff repair     . Cardiac stent x 2   . Dual pacemaker.    Past Medical History  Diagnosis Date  . Atrial fibrillation   . CAD (coronary artery disease)     DES x 2 to RCA 10/10  . GERD (gastroesophageal reflux disease)   . Tachycardia-bradycardia syndrome     s/p Medtronic Adapta L dual chamber device  5/10  . Hyperlipidemia   . COPD (chronic obstructive pulmonary disease)   . Anxiety   . Resting tremor   . 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 Chronic back pain   . Pericardial effusion     Hemorrhagic   . Hypertension    History   Social History  . Marital Status: Married    Spouse Name: N/A    Number of Children: N/A  . Years of Education: N/A   Occupational History  . Retired    Social History Main Topics  . Smoking status: Never Smoker   . Smokeless tobacco: Not on file  . Alcohol Use: No  . Drug Use: No  . Sexually Active: Not on file   Other Topics Concern  . Not on file   Social History Narrative   She lives alone, has adopted daughter.   Family Status  Relation Status Death Age  . Mother Deceased     colon cancer  . Sister Deceased     CAD x 2. One died from pneumonia  . Father Deceased     esophageal cancer  . Brother Deceased     Brain tumor, one had MI  . Brother Alive     good health  . Sister Alive     half sister. not in good health   Allergies  Allergen Reactions  . Amitriptyline Hcl   . Sulfonamide Derivatives        Objective:   Physical Exam Filed Vitals:   06/23/11 1533  BP: 108/60  Pulse: 66  Temp: 98.1 F (36.7 C)  Height: 5\' 6"  (1.676 m)  Weight: 159 lb 14.4 oz (72.53 kg)    Alert and oriented. Skin warm and dry. Oral mucosa is moist.   Sclera anicteric, conjunctivae is pink. Thyroid not enlarged. No cervical lymphadenopathy. Lungs clear. Heart regular rate and rhythm.  Abdomen is soft. Bowel sounds are positive. No hepatomegaly. No abdominal masses felt. No tenderness.Stool brown and guaiac negative.  No  edema to lower extremities. Patient is  alert and oriented.     Assessment:    Anemia.  I will discuss with [name]. She has been anemic in the past. She had a normal colonoscopy in October of  2011. Once studies ar back I will discuss with  [name]. She was guaiac negative today in the office.    Plan:    Barium pill study. Repeat CBC today and ferritin

## 2011-06-23 NOTE — Patient Instructions (Signed)
Will repeat a CBC on her today as wel as a Ferritin. Will also get a pill esophogram.  Will discuss this case with [name].

## 2011-06-23 NOTE — Progress Notes (Signed)
Clinical Summary Ms. [name] is a 75 y.o.female presenting for followup. She was seen recently in hospital consultation with chest pain, ruled out for MI. Myoview from August showed no ischemia and LVEF remains normal. We have been continuing medical therapy.  She states that she actually feels "pretty good." No progressive chest pain. Has chronic dyspnea on exertion that has not worsened. No palpitations.  Plan to see [name] for neurology followup.  Allergies  Allergen Reactions  . Amitriptyline Hcl   . Sulfonamide Derivatives     Medication list reviewed.  Past Medical History  Diagnosis Date  . Atrial fibrillation   . CAD (coronary artery disease)     DES x 2 to RCA 10/10  . GERD (gastroesophageal reflux disease)   . Tachycardia-bradycardia syndrome     s/p Medtronic Adapta L dual chamber device  5/10  . Hyperlipidemia   . COPD (chronic obstructive pulmonary disease)   . Anxiety   . Resting tremor   . 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 Chronic back pain   . Pericardial effusion     Hemorrhagic   . Hypertension     Past Surgical History  Procedure Date  . Cholecystectomy   . Appendectomy   . Subxiphoid pericardial window 11/10  . Esophagogastroduodenoscopy with esophageal dilation 2004, 2006, 2007  . Vaginal hysterectomy   . Colonoscopy 2011  . Right rotator cuff repair     Family History  Problem Relation Age of Onset  . Cancer Mother     Colon   . Coronary artery disease Sister   . Coronary artery disease Brother     Social History [name] reports that she has never smoked. She does not have any smokeless tobacco history on file. [name] reports that she does not drink alcohol.  Review of Systems Describes some abdominal bloating, no melena or hematochezia. Stable tremor. Otherwise negative.  Physical Examination Filed Vitals:   06/23/11 0831  BP: 150/83  Pulse: 83  Resp: 16   Chronically ill-appearing woman in no  acute distress.  HEENT: Conjunctiva and lids are normal, oropharynx clear.  Neck: Supple no elevated jugular venous pressure or bruits.  Lungs: Clear to auscultation with diminished breath sounds. Non-labored breathing at rest.  Cardiac: Regular rate and rhythm without S3 gallop. No pericardial rub or knock.  Abdomen: Protuberant but soft, nontender, bowel sounds present  Extremities: Trace edema.  Skin: Warm and dry.  Musculoskeletal: Kyphosis noted.  Neuropsychiatric: Alert and oriented x3. Affect appropriate. Resting tremor.    Problem List and Plan

## 2011-06-23 NOTE — Assessment & Plan Note (Signed)
Relatively stable on current medical regimen. Recent Myoview from August was low risk. Continue observation.

## 2011-06-23 NOTE — Assessment & Plan Note (Signed)
In sinus rhythm by exam today. No Coumadin as mentioned previously with prior history of hemorrhagic pericardial effusion, also dual antiplatelet therapy. Amiodarone continues at 100 mg daily.

## 2011-06-24 ENCOUNTER — Ambulatory Visit: Payer: Medicare Other | Admitting: Orthopedic Surgery

## 2011-06-24 LAB — FERRITIN: Ferritin: 16 ng/mL (ref 10–291)

## 2011-06-24 LAB — CBC
HCT: 32.1 % — ABNORMAL LOW (ref 36.0–46.0)
MCHC: 30.5 g/dL (ref 30.0–36.0)
Platelets: 260 10*3/uL (ref 150–400)
RDW: 14.4 % (ref 11.5–15.5)

## 2011-06-25 ENCOUNTER — Ambulatory Visit (HOSPITAL_COMMUNITY): Payer: Medicare Other

## 2011-06-30 ENCOUNTER — Ambulatory Visit (HOSPITAL_COMMUNITY): Payer: Medicare Other

## 2011-07-02 ENCOUNTER — Ambulatory Visit: Payer: Medicare Other | Admitting: Cardiology

## 2011-07-08 ENCOUNTER — Ambulatory Visit (INDEPENDENT_AMBULATORY_CARE_PROVIDER_SITE_OTHER): Payer: Medicare Other | Admitting: Orthopedic Surgery

## 2011-07-08 ENCOUNTER — Encounter: Payer: Self-pay | Admitting: Orthopedic Surgery

## 2011-07-08 VITALS — BP 116/60 | Ht 66.0 in | Wt 159.0 lb

## 2011-07-08 DIAGNOSIS — M75101 Unspecified rotator cuff tear or rupture of right shoulder, not specified as traumatic: Secondary | ICD-10-CM

## 2011-07-08 DIAGNOSIS — M719 Bursopathy, unspecified: Secondary | ICD-10-CM

## 2011-07-08 DIAGNOSIS — M67919 Unspecified disorder of synovium and tendon, unspecified shoulder: Secondary | ICD-10-CM

## 2011-07-08 NOTE — Progress Notes (Signed)
Chief complaint followup LEFT shoulder pain, but now has RIGHT shoulder pain.  Had a LEFT subacromial injection for rotator cuff syndrome on the LEFT has improved, had a RIGHT rotator cuff repair did well, but reinjured it lifting furniture.  Review of systems normal musculoskeletal other than stated. Does have some neck pain. No radicular symptoms.  RIGHT shoulder forward elevation, 150, painful arc of motion 90-150, positive impingement syndrome. No cuff weakness. Mild tenderness base of cervical spine with large flexion deformity at the cervical, thoracic junction. No atrophy in the RIGHT arm. Neurovascular exam normal.  Impression shoulder pain rotator cuff syndrome, RIGHT shoulder.  Plan inject RIGHT shoulder.  Shoulder Injection Procedure Note   Pre-operative Diagnosis: right  RC Syndrome  Post-operative Diagnosis: same  Indications: pain   Anesthesia: ethyl chloride   Procedure Details   Verbal consent was obtained for the procedure. The shoulder was prepped withalcohol and the skin was anesthetized. A 20 gauge needle was advanced into the subacromial space through posterior approach without difficulty  The space was then injected with 3 ml 1% lidocaine and 1 ml of depomedrol. The injection site was cleansed with isopropyl alcohol and a dressing was applied.  Complications:  None; patient tolerated the procedure well.

## 2011-07-08 NOTE — Patient Instructions (Signed)
You have received a steroid shot. 15% of patients experience increased pain at the injection site with in the next 24 hours. This is best treated with ice and tylenol extra strength 2 tabs every 8 hours. If you are still having pain please call the office.    

## 2011-09-24 ENCOUNTER — Ambulatory Visit (INDEPENDENT_AMBULATORY_CARE_PROVIDER_SITE_OTHER): Payer: Medicare Other | Admitting: Cardiology

## 2011-09-24 ENCOUNTER — Encounter: Payer: Self-pay | Admitting: Cardiology

## 2011-09-24 VITALS — BP 166/98 | HR 80 | Resp 18 | Ht 66.0 in | Wt 160.0 lb

## 2011-09-24 DIAGNOSIS — E782 Mixed hyperlipidemia: Secondary | ICD-10-CM

## 2011-09-24 DIAGNOSIS — I2510 Atherosclerotic heart disease of native coronary artery without angina pectoris: Secondary | ICD-10-CM

## 2011-09-24 DIAGNOSIS — Z950 Presence of cardiac pacemaker: Secondary | ICD-10-CM

## 2011-09-24 DIAGNOSIS — I4891 Unspecified atrial fibrillation: Secondary | ICD-10-CM

## 2011-09-24 NOTE — Assessment & Plan Note (Signed)
Keep regular followup with Dr. Taylor. 

## 2011-09-24 NOTE — Assessment & Plan Note (Signed)
No progressive palpitations. Continue low dose Amiodarone with followup LFT and TSH.

## 2011-09-24 NOTE — Assessment & Plan Note (Signed)
No active angina on medical therapy. No changes made today. Continue observation.

## 2011-09-24 NOTE — Patient Instructions (Signed)
Your physician recommends that you schedule a follow-up appointment in: 3 months  Your physician recommends that you return for lab work in: Lab work today

## 2011-09-24 NOTE — Progress Notes (Signed)
Clinical Summary [name] is a 76 y.o.female presenting for followup. She was seen in November 2012.  She reports shortness of breath, but no progression. No chest pain or orthopnea. No palpitations.  Labwork from October and November reviewed. We discussed this and her cardiac testing from last August.  She continues on a stable cardiac regimen.   Allergies  Allergen Reactions  . Amitriptyline Hcl   . Sulfonamide Derivatives     Current Outpatient Prescriptions  Medication Sig Dispense Refill  . ALPRAZolam (XANAX) 0.5 MG tablet Take 0.25-0.5 mg by mouth as needed. For anxiety      . amiodarone (PACERONE) 100 MG tablet Take 1 tablet (100 mg total) by mouth daily. Decrease in dose  30 tablet  3  . aspirin 325 MG tablet Take 325 mg by mouth daily.        . B Complex Vitamins (B COMPLEX-B12 PO) Take 1 tablet by mouth daily.       . clopidogrel (PLAVIX) 75 MG tablet Take 1 tablet (75 mg total) by mouth daily.  30 tablet  3  . esomeprazole (NEXIUM) 40 MG capsule Take 40 mg by mouth every morning before breakfast.        . ferrous sulfate 325 (65 FE) MG tablet Take 1 tablet (325 mg total) by mouth daily with breakfast. IRON SUPPLEMENT FOR ANEMIA.      Marland Kitchen FOLIC ACID PO Take by mouth daily.      . furosemide (LASIX) 40 MG tablet Take 40 mg by mouth as directed. Take 1 tablet am and 1/2 pm       . isosorbide mononitrate (IMDUR) 30 MG 24 hr tablet Take 1 tablet (30 mg total) by mouth daily.  30 tablet  2  . metoprolol (TOPROL-XL) 50 MG 24 hr tablet Take 50 mg by mouth every morning.        . nitroGLYCERIN (NITROSTAT) 0.4 MG SL tablet Place 0.4 mg under the tongue. Take as directed for chest pain       . ondansetron (ZOFRAN) 4 MG tablet Take 4 mg by mouth. As needed for nausea       . potassium chloride SA (K-DUR,KLOR-CON) 20 MEQ tablet Take 1 tablet (20 mEq total) by mouth 2 (two) times daily. THE DOSE WAS INCREASED TO 1 TABLET TWO TIMES DAILY.  60 tablet  2  . primidone (MYSOLINE) 50 MG  tablet Take 100 mg by mouth at bedtime.       . rosuvastatin (CRESTOR) 20 MG tablet Take 20 mg by mouth at bedtime.       . vitamin E 400 UNIT capsule Take 400 Units by mouth daily.          Past Medical History  Diagnosis Date  . Atrial fibrillation   . CAD (coronary artery disease)     DES x 2 to RCA 10/10  . GERD (gastroesophageal reflux disease)   . Tachycardia-bradycardia syndrome     s/p Medtronic Adapta L dual chamber device  5/10  . Hyperlipidemia   . COPD (chronic obstructive pulmonary disease)   . Anxiety   . Resting tremor   . 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 Chronic back pain   . Pericardial effusion     Hemorrhagic   . Hypertension     Social History [name] reports that she has never smoked. She does not have any smokeless tobacco history on file. [name] reports that she does not  drink alcohol.  Review of Systems As outlined above, otherwise negative.  Physical Examination Filed Vitals:   09/24/11 0954  BP: 166/98  Pulse: 80  Resp: 18   Chronically ill-appearing woman in no acute distress.  HEENT: Conjunctiva and lids are normal, oropharynx clear.  Neck: Supple no elevated jugular venous pressure or bruits.  Lungs: Clear to auscultation with diminished breath sounds. Non-labored breathing at rest.  Cardiac: Regular rate and rhythm without S3 gallop. No pericardial rub or knock.  Abdomen: Protuberant but soft, nontender, bowel sounds present  Extremities: Trace edema.  Skin: Warm and dry.  Musculoskeletal: Kyphosis noted.  Neuropsychiatric: Alert and oriented x3. Affect appropriate. Resting tremor.    Problem List and Plan

## 2011-09-24 NOTE — Assessment & Plan Note (Signed)
Due for followup FLP.

## 2011-09-29 ENCOUNTER — Telehealth: Payer: Self-pay

## 2011-09-30 ENCOUNTER — Telehealth: Payer: Self-pay

## 2011-09-30 NOTE — Telephone Encounter (Signed)
**Note De-Identified [name] Obfuscation** Message copied by Demetrios Loll on Tue Sep 30, 2011  4:05 PM ------      Message from: Tynan Boesel M      Created: Fri Sep 26, 2011 11:55 AM      Regarding: labs       Lipid/lft's

## 2011-10-02 ENCOUNTER — Encounter (INDEPENDENT_AMBULATORY_CARE_PROVIDER_SITE_OTHER): Payer: Self-pay | Admitting: *Deleted

## 2011-10-03 ENCOUNTER — Telehealth: Payer: Self-pay

## 2011-10-04 LAB — HEPATIC FUNCTION PANEL
ALT: 17 U/L (ref 0–35)
Alkaline Phosphatase: 89 U/L (ref 39–117)
Bilirubin, Direct: 0.1 mg/dL (ref 0.0–0.3)
Indirect Bilirubin: 0.2 mg/dL (ref 0.0–0.9)

## 2011-10-04 LAB — LIPID PANEL
Cholesterol: 218 mg/dL — ABNORMAL HIGH (ref 0–200)
VLDL: 64 mg/dL — ABNORMAL HIGH (ref 0–40)

## 2011-10-17 ENCOUNTER — Encounter (HOSPITAL_COMMUNITY): Payer: Self-pay

## 2011-10-17 ENCOUNTER — Ambulatory Visit (HOSPITAL_COMMUNITY)
Admission: RE | Admit: 2011-10-17 | Discharge: 2011-10-17 | Disposition: A | Discharge status: Home / Self Care | Payer: Medicare Other | Source: Ambulatory Visit | Attending: Internal Medicine | Admitting: Internal Medicine

## 2011-10-17 ENCOUNTER — Other Ambulatory Visit (HOSPITAL_COMMUNITY): Payer: Self-pay | Admitting: Internal Medicine

## 2011-10-17 DIAGNOSIS — M25559 Pain in unspecified hip: Secondary | ICD-10-CM | POA: Insufficient documentation

## 2011-10-17 DIAGNOSIS — R52 Pain, unspecified: Secondary | ICD-10-CM

## 2011-10-22 ENCOUNTER — Ambulatory Visit (INDEPENDENT_AMBULATORY_CARE_PROVIDER_SITE_OTHER): Payer: Medicare Other | Admitting: Internal Medicine

## 2011-11-04 ENCOUNTER — Other Ambulatory Visit: Payer: Self-pay | Admitting: Cardiology

## 2011-11-04 ENCOUNTER — Encounter: Payer: Self-pay | Admitting: Internal Medicine

## 2011-11-05 ENCOUNTER — Ambulatory Visit (INDEPENDENT_AMBULATORY_CARE_PROVIDER_SITE_OTHER): Payer: Medicare Other | Admitting: Internal Medicine

## 2011-11-14 ENCOUNTER — Inpatient Hospital Stay (HOSPITAL_COMMUNITY)
Admission: EM | Admit: 2011-11-14 | Discharge: 2011-11-18 | DRG: 287 | Disposition: A | Discharge status: Home / Self Care | Payer: Medicare Other | Attending: Cardiology | Admitting: Cardiology

## 2011-11-14 ENCOUNTER — Emergency Department (HOSPITAL_COMMUNITY): Payer: Medicare Other

## 2011-11-14 ENCOUNTER — Encounter (HOSPITAL_COMMUNITY): Payer: Self-pay | Admitting: Emergency Medicine

## 2011-11-14 DIAGNOSIS — I10 Essential (primary) hypertension: Secondary | ICD-10-CM

## 2011-11-14 DIAGNOSIS — R0789 Other chest pain: Principal | ICD-10-CM | POA: Diagnosis present

## 2011-11-14 DIAGNOSIS — J4489 Other specified chronic obstructive pulmonary disease: Secondary | ICD-10-CM | POA: Diagnosis present

## 2011-11-14 DIAGNOSIS — Z8249 Family history of ischemic heart disease and other diseases of the circulatory system: Secondary | ICD-10-CM

## 2011-11-14 DIAGNOSIS — Z950 Presence of cardiac pacemaker: Secondary | ICD-10-CM

## 2011-11-14 DIAGNOSIS — I495 Sick sinus syndrome: Secondary | ICD-10-CM | POA: Diagnosis present

## 2011-11-14 DIAGNOSIS — I2510 Atherosclerotic heart disease of native coronary artery without angina pectoris: Secondary | ICD-10-CM

## 2011-11-14 DIAGNOSIS — R0602 Shortness of breath: Secondary | ICD-10-CM | POA: Diagnosis present

## 2011-11-14 DIAGNOSIS — M549 Dorsalgia, unspecified: Secondary | ICD-10-CM | POA: Diagnosis present

## 2011-11-14 DIAGNOSIS — Z7982 Long term (current) use of aspirin: Secondary | ICD-10-CM

## 2011-11-14 DIAGNOSIS — R079 Chest pain, unspecified: Secondary | ICD-10-CM | POA: Diagnosis present

## 2011-11-14 DIAGNOSIS — D649 Anemia, unspecified: Secondary | ICD-10-CM | POA: Diagnosis present

## 2011-11-14 DIAGNOSIS — J449 Chronic obstructive pulmonary disease, unspecified: Secondary | ICD-10-CM | POA: Diagnosis present

## 2011-11-14 DIAGNOSIS — E785 Hyperlipidemia, unspecified: Secondary | ICD-10-CM | POA: Diagnosis present

## 2011-11-14 HISTORY — DX: Anemia, unspecified: D64.9

## 2011-11-14 HISTORY — DX: Personal history of other diseases of the digestive system: Z87.19

## 2011-11-14 HISTORY — DX: Myoneural disorder, unspecified: G70.9

## 2011-11-14 HISTORY — DX: Headache: R51

## 2011-11-14 LAB — POCT I-STAT TROPONIN I: Troponin i, poc: 0.01 ng/mL (ref 0.00–0.08)

## 2011-11-14 LAB — COMPREHENSIVE METABOLIC PANEL
ALT: 14 U/L (ref 0–35)
AST: 23 U/L (ref 0–37)
Albumin: 4 g/dL (ref 3.5–5.2)
Alkaline Phosphatase: 98 U/L (ref 39–117)
Chloride: 104 mEq/L (ref 96–112)
Potassium: 3.9 mEq/L (ref 3.5–5.1)
Sodium: 140 mEq/L (ref 135–145)
Total Bilirubin: 0.3 mg/dL (ref 0.3–1.2)

## 2011-11-14 LAB — CBC
HCT: 38.5 % (ref 36.0–46.0)
Hemoglobin: 12.1 g/dL (ref 12.0–15.0)
MCH: 27.6 pg (ref 26.0–34.0)
MCHC: 31.4 g/dL (ref 30.0–36.0)
MCV: 87.7 fL (ref 78.0–100.0)
Platelets: 217 K/uL (ref 150–400)
RBC: 4.39 MIL/uL (ref 3.87–5.11)
RDW: 17.3 % — ABNORMAL HIGH (ref 11.5–15.5)
WBC: 5.5 K/uL (ref 4.0–10.5)

## 2011-11-14 LAB — PROTIME-INR
INR: 0.97 (ref 0.00–1.49)
Prothrombin Time: 13.1 s (ref 11.6–15.2)

## 2011-11-14 LAB — DIFFERENTIAL
Basophils Absolute: 0 10*3/uL (ref 0.0–0.1)
Basophils Relative: 0 % (ref 0–1)
Lymphocytes Relative: 47 % — ABNORMAL HIGH (ref 12–46)
Neutro Abs: 2.3 10*3/uL (ref 1.7–7.7)
Neutrophils Relative %: 42 % — ABNORMAL LOW (ref 43–77)

## 2011-11-14 LAB — APTT: aPTT: 31 seconds (ref 24–37)

## 2011-11-14 MED ORDER — NITROGLYCERIN IN D5W 200-5 MCG/ML-% IV SOLN
5.0000 ug/min | Freq: Once | INTRAVENOUS | Status: DC
  Filled 2011-11-14: qty 250

## 2011-11-14 MED ORDER — HEPARIN BOLUS VIA INFUSION
3000.0000 [IU] | Freq: Once | INTRAVENOUS | Status: AC
  Administered 2011-11-14: 3000 [IU] via INTRAVENOUS

## 2011-11-14 MED ORDER — ASPIRIN 81 MG PO CHEW
324.0000 mg | CHEWABLE_TABLET | Freq: Once | ORAL | Status: AC
  Administered 2011-11-14: 324 mg via ORAL
  Filled 2011-11-14: qty 4

## 2011-11-14 MED ORDER — MORPHINE SULFATE 2 MG/ML IJ SOLN
2.0000 mg | Freq: Once | INTRAMUSCULAR | Status: AC
  Administered 2011-11-14: 2 mg via INTRAVENOUS

## 2011-11-14 MED ORDER — HEPARIN (PORCINE) IN NACL 100-0.45 UNIT/ML-% IJ SOLN
1000.0000 [IU]/h | INTRAMUSCULAR | Status: DC
  Administered 2011-11-14 – 2011-11-16 (×3): 1000 [IU]/h via INTRAVENOUS
  Filled 2011-11-14 (×5): qty 250

## 2011-11-14 MED ORDER — MORPHINE SULFATE 2 MG/ML IJ SOLN
INTRAMUSCULAR | Status: AC
  Filled 2011-11-14: qty 1

## 2011-11-14 MED ORDER — MORPHINE SULFATE 4 MG/ML IJ SOLN
INTRAMUSCULAR | Status: AC
  Administered 2011-11-14: 4 mg
  Filled 2011-11-14: qty 1

## 2011-11-14 MED ORDER — NITROGLYCERIN 2 % TD OINT
1.0000 [in_us] | TOPICAL_OINTMENT | Freq: Once | TRANSDERMAL | Status: AC
  Administered 2011-11-14: 1 [in_us] via TOPICAL
  Filled 2011-11-14: qty 1

## 2011-11-14 NOTE — ED Provider Notes (Cosign Needed Addendum)
History     CSN: [id number]  Arrival date & time 11/14/11  1401   First MD Initiated Contact with Patient 11/14/11 1414      Chief Complaint  Patient presents with  . Chest Pain  . Shortness of Breath  . Dizziness    (Consider location/radiation/quality/duration/timing/severity/associated sxs/prior treatment) HPI  Patient relates she's been having chest pain off and all week. Patient difficult to pin down about specific timing or symptoms. She does report however she started having chest pain last night it was more constant. She took 2 nitroglycerin which helped ease the pain but did not make it go away. She was seen this morning in Dr. Scharlene Gloss office and sent to the ER. He relates the pain started last night and continued this morning. She states sometimes it radiates into her right ear and her neck. She states she has not had nausea or vomiting but did have diaphoresis yesterday. She states if she sweeps or vacuums that will make the pain come on however there are times it comes on without exertion. She states she has felt bad and she feels weak. She states her chest discomfort is centrally located and is described as an aching pain. She states she had stents placed in 2010 and she also had a pericardial window placed. She states her pain last night was "moderate" in her pain currently is "mild"  PCP Dr Hughie Closs Cardiology Dr Diona Browner  Past Medical History  Diagnosis Date  . Atrial fibrillation   . CAD (coronary artery disease)     DES x 2 to RCA 10/10  . GERD (gastroesophageal reflux disease)   . Tachycardia-bradycardia syndrome     s/p Medtronic Adapta L dual chamber device  5/10  . Hyperlipidemia   . COPD (chronic obstructive pulmonary disease)   . Anxiety   . Resting tremor   . 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 Chronic back pain   . Pericardial effusion     Hemorrhagic   . Hypertension     Past Surgical History  Procedure Date  . Cholecystectomy    . Appendectomy   . Subxiphoid pericardial window 11/10  . Esophagogastroduodenoscopy with esophageal dilation 2004, 2006, 2007  . Vaginal hysterectomy   . Colonoscopy 2011  . Right rotator cuff repair   . Cardiac stent x 2   . Dual pacemaker.     Family History  Problem Relation Age of Onset  . Cancer Mother     Colon   . Coronary artery disease Sister   . Coronary artery disease Brother     History  Substance Use Topics  . Smoking status: Never Smoker   . Smokeless tobacco: Not on file  . Alcohol Use: No  lives at home with husband and 13yo GD  OB History    Grav Para Term Preterm Abortions TAB SAB Ect Mult Living                  Review of Systems  All other systems reviewed and are negative.    Allergies  Amitriptyline hcl and Sulfonamide derivatives  Home Medications   Current Outpatient Rx  Name Route Sig Dispense Refill  . ALPRAZOLAM 0.5 MG PO TABS Oral Take 0.25-0.5 mg by mouth as needed. For anxiety    . AMIODARONE HCL 100 MG PO TABS Oral Take 1 tablet (100 mg total) by mouth daily. Decrease in dose 30 tablet 3  . AMIODARONE HCL 200 MG  PO TABS  TAKE (1/2) TABLET BY MOUTH DAILY. 15 tablet 6  . ASPIRIN 325 MG PO TABS Oral Take 325 mg by mouth daily.      . B COMPLEX-B12 PO Oral Take 1 tablet by mouth daily.     Marland Kitchen CLOPIDOGREL BISULFATE 75 MG PO TABS Oral Take 1 tablet (75 mg total) by mouth daily. 30 tablet 3  . ESOMEPRAZOLE MAGNESIUM 40 MG PO CPDR Oral Take 40 mg by mouth every morning before breakfast.      . FERROUS SULFATE 325 (65 FE) MG PO TABS Oral Take 1 tablet (325 mg total) by mouth daily with breakfast. IRON SUPPLEMENT FOR ANEMIA.    Marland Kitchen FOLIC ACID PO Oral Take by mouth daily.    . FUROSEMIDE 40 MG PO TABS Oral Take 40 mg by mouth as directed. Take 1 tablet am and 1/2 pm     . ISOSORBIDE MONONITRATE ER 30 MG PO TB24 Oral Take 1 tablet (30 mg total) by mouth daily. 30 tablet 2    FOR CHEST PAIN FROM YOUR HEART.  Marland Kitchen METOPROLOL SUCCINATE ER 50 MG PO  TB24 Oral Take 50 mg by mouth every morning.      Marland Kitchen NITROGLYCERIN 0.4 MG SL SUBL Sublingual Place 0.4 mg under the tongue. Take as directed for chest pain     . ONDANSETRON HCL 4 MG PO TABS Oral Take 4 mg by mouth. As needed for nausea     . POTASSIUM CHLORIDE CRYS ER 20 MEQ PO TBCR Oral Take 1 tablet (20 mEq total) by mouth 2 (two) times daily. THE DOSE WAS INCREASED TO 1 TABLET TWO TIMES DAILY. 60 tablet 2  . PRIMIDONE 50 MG PO TABS Oral Take 100 mg by mouth at bedtime.     Marland Kitchen ROSUVASTATIN CALCIUM 20 MG PO TABS Oral Take 20 mg by mouth at bedtime.     Marland Kitchen VITAMIN E 400 UNITS PO CAPS Oral Take 400 Units by mouth daily.        BP 142/83  Pulse 85  Temp(Src) 97.9 F (36.6 C) (Oral)  Resp 20  Ht 5' 6.5" (1.689 m)  Wt 158 lb (71.668 kg)  BMI 25.12 kg/m2  SpO2 95%  Vital signs normal    Physical Exam  Constitutional: She is oriented to person, place, and time. She appears well-developed and well-nourished.  Non-toxic appearance. She does not appear ill. No distress.  HENT:  Head: Normocephalic and atraumatic.  Right Ear: External ear normal.  Left Ear: External ear normal.  Nose: Nose normal. No mucosal edema or rhinorrhea.  Mouth/Throat: Oropharynx is clear and moist and mucous membranes are normal. No dental abscesses or uvula swelling.  Eyes: Conjunctivae and EOM are normal. Pupils are equal, round, and reactive to light.  Neck: Normal range of motion and full passive range of motion without pain. Neck supple.  Cardiovascular: Normal rate, regular rhythm and normal heart sounds.  Exam reveals no gallop and no friction rub.   No murmur heard. Pulmonary/Chest: Effort normal and breath sounds normal. No respiratory distress. She has no wheezes. She has no rhonchi. She has no rales. She exhibits no tenderness and no crepitus.  Abdominal: Soft. Normal appearance and bowel sounds are normal. She exhibits no distension. There is no tenderness. There is no rebound and no guarding.    Musculoskeletal: Normal range of motion. She exhibits no edema and no tenderness.       Moves all extremities well.   Neurological: She is alert and  oriented to person, place, and time. She has normal strength. No cranial nerve deficit.       Patient has moderate tremor which she states is a essential tremor  Skin: Skin is warm, dry and intact. No rash noted. No erythema. No pallor.  Psychiatric: Her speech is normal and behavior is normal. Her mood appears not anxious.       Affect is flat    ED Course  Procedures (including critical care time)   Medications  folic acid (FOLVITE) 1 MG tablet (not administered)  clopidogrel (PLAVIX) 75 MG tablet (not administered)  amiodarone (PACERONE) 100 MG tablet (not administered)  isosorbide mononitrate (IMDUR) 30 MG 24 hr tablet (not administered)  potassium chloride SA (K-DUR,KLOR-CON) 20 MEQ tablet (not administered)  ferrous sulfate 325 (65 FE) MG tablet (not administered)  nitroGLYCERIN 0.2 mg/mL in dextrose 5 % infusion (5 mcg/min Intravenous Rate/Dose Change 11/14/11 2046)  heparin ADULT infusion 100 units/mL (25000 units/250 mL) (1000 Units/hr Intravenous New Bag/Given 11/14/11 2052)  nitroGLYCERIN (NITROGLYN) 2 % ointment 1 inch (1 inch Topical Given 11/14/11 1500)  aspirin chewable tablet 324 mg (324 mg Oral Given 11/14/11 2042)  heparin bolus via infusion 3,000 Units (3000 Units Intravenous Given 11/14/11 2052)  morphine 4 MG/ML injection (4 mg  Given 11/14/11 2141)  morphine 2 MG/ML injection 2 mg (2 mg Intravenous Given 11/14/11 2229)     The patient's chart shows in August 2012 she had an echo showing ejection fraction 60-65% with a mildly dilated left atrium and mild mitral regurgitation. I found a cardiac cath from July 2007 showing she had a 20% left main lesion which was the origin of her LAD and circumflex. Her LAD had a 40% lesion, her circumflex had a 30% lesion. Her RCA had a 60% lesion. Patient states her stents were placed in  the RCA.  20:05 Dr Fausto Skillern transfer to [facility] to tele for Dr Myrtis Ser  Results for orders placed during the hospital encounter of 11/14/11  APTT      Component Value Range   aPTT 31  24 - 37 (seconds)  PROTIME-INR      Component Value Range   Prothrombin Time 13.1  11.6 - 15.2 (seconds)   INR 0.97  0.00 - 1.49   POCT I-STAT TROPONIN I      Component Value Range   Troponin i, poc 0.01  0.00 - 0.08 (ng/mL)   Comment 3           CBC      Component Value Range   WBC 5.5  4.0 - 10.5 (K/uL)   RBC 4.39  3.87 - 5.11 (MIL/uL)   Hemoglobin 12.1  12.0 - 15.0 (g/dL)   HCT 82.9  56.2 - 13.0 (%)   MCV 87.7  78.0 - 100.0 (fL)   MCH 27.6  26.0 - 34.0 (pg)   MCHC 31.4  30.0 - 36.0 (g/dL)   RDW 86.5 (*) 78.4 - 15.5 (%)   Platelets 217  150 - 400 (K/uL)  DIFFERENTIAL      Component Value Range   Neutrophils Relative 42 (*) 43 - 77 (%)   Neutro Abs 2.3  1.7 - 7.7 (K/uL)   Lymphocytes Relative 47 (*) 12 - 46 (%)   Lymphs Abs 2.6  0.7 - 4.0 (K/uL)   Monocytes Relative 10  3 - 12 (%)   Monocytes Absolute 0.6  0.1 - 1.0 (K/uL)   Eosinophils Relative 2  0 - 5 (%)   Eosinophils Absolute  0.1  0.0 - 0.7 (K/uL)   Basophils Relative 0  0 - 1 (%)   Basophils Absolute 0.0  0.0 - 0.1 (K/uL)  COMPREHENSIVE METABOLIC PANEL      Component Value Range   Sodium 140  135 - 145 (mEq/L)   Potassium 3.9  3.5 - 5.1 (mEq/L)   Chloride 104  96 - 112 (mEq/L)   CO2 25  19 - 32 (mEq/L)   Glucose, Bld 92  70 - 99 (mg/dL)   BUN 10  6 - 23 (mg/dL)   Creatinine, Ser 1.61  0.50 - 1.10 (mg/dL)   Calcium 09.6  8.4 - 10.5 (mg/dL)   Total Protein 8.1  6.0 - 8.3 (g/dL)   Albumin 4.0  3.5 - 5.2 (g/dL)   AST 23  0 - 37 (U/L)   ALT 14  0 - 35 (U/L)   Alkaline Phosphatase 98  39 - 117 (U/L)   Total Bilirubin 0.3  0.3 - 1.2 (mg/dL)   GFR calc non Af Amer 82 (*) >90 (mL/min)   GFR calc Af Amer >90  >90 (mL/min)  POCT I-STAT TROPONIN I      Component Value Range   Troponin i, poc 0.01  0.00 - 0.08 (ng/mL)   Comment 3            PRO B NATRIURETIC PEPTIDE      Component Value Range   Pro B Natriuretic peptide (BNP) 270.1 (*) 0 - 125 (pg/mL)   Laboratory interpretation all normal    Date: 11/14/2011  Rate: 67  Rhythm: normal sinus rhythm  QRS Axis: normal  Intervals: QT prolonged  ST/T Wave abnormalities: T wave inversions in anterior leads  Conduction Disutrbances:none  Narrative Interpretation:   Old EKG Reviewed: changes noted from 06/03/2011 new TWI in anterior leads    1. Chest pain   2. Coronary artery disease    Plan transfer to [facility]  Devoria Albe, MD, FACEP    MDM          Ward Givens, MD 11/14/11 2010  Ward Givens, MD 11/14/11 0454  Ward Givens, MD 11/20/11 1101

## 2011-11-14 NOTE — Progress Notes (Signed)
ANTICOAGULATION CONSULT NOTE - Initial Consult  Pharmacy Consult for Heparin Indication: chest pain/ACS  Allergies  Allergen Reactions  . Amitriptyline Hcl Other (See Comments)    Caused "jaws to twist and lock"  . Sulfonamide Derivatives     UNKNOWN REACTION    Patient Measurements: Height: 5\' 5"  (165.1 cm) Weight: 158 lb (71.668 kg) IBW/kg (Calculated) : 57  Heparin Dosing Weight: 71.7kg  Vital Signs: Temp: 97.5 F (36.4 C) (04/12 1945) Temp src: Oral (04/12 1945) BP: 140/76 mmHg (04/12 1945) Pulse Rate: 60  (04/12 1700)  Labs:  Basename 11/14/11 1444  HGB 12.1  HCT 38.5  PLT 217  APTT 31  LABPROT 13.1  INR 0.97  HEPARINUNFRC --  CREATININE 0.73  CKTOTAL --  CKMB --  TROPONINI --   Estimated Creatinine Clearance: 61.3 ml/min (by C-G formula based on Cr of 0.73).  Medical History: Past Medical History  Diagnosis Date  . Atrial fibrillation   . CAD (coronary artery disease)     DES x 2 to RCA 10/10  . GERD (gastroesophageal reflux disease)   . Tachycardia-bradycardia syndrome     s/p Medtronic Adapta L dual chamber device  5/10  . Hyperlipidemia   . COPD (chronic obstructive pulmonary disease)   . Anxiety   . Resting tremor   . 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 Chronic back pain   . Pericardial effusion     Hemorrhagic   . Hypertension     Medications:   (Not in a hospital admission)  Assessment: Okay for Protocol  Goal of Therapy:  Heparin level 0.3-0.7 units/ml   Plan:  3000 units bolus, infusion rate 1000 units/hr and Continue platelet monitoring per protocol Heparin Level in 6-8 hours Daily Heparin Level/CBC while on Heparin.  Mady Gemma 11/14/2011,8:43 PM

## 2011-11-14 NOTE — ED Notes (Signed)
Pt c/o dizziness since Sunday with chest pain starting last night. Pt also c/o sob and was sent over by Dr. Margo Aye for ekg changes.

## 2011-11-15 ENCOUNTER — Encounter (HOSPITAL_COMMUNITY): Payer: Self-pay | Admitting: *Deleted

## 2011-11-15 DIAGNOSIS — R079 Chest pain, unspecified: Secondary | ICD-10-CM

## 2011-11-15 LAB — CARDIAC PANEL(CRET KIN+CKTOT+MB+TROPI)
CK, MB: 2.4 ng/mL (ref 0.3–4.0)
Relative Index: INVALID (ref 0.0–2.5)
Relative Index: INVALID (ref 0.0–2.5)
Total CK: 77 U/L (ref 7–177)
Total CK: 70 U/L (ref 7–177)
Total CK: 66 U/L (ref 7–177)
Troponin I: <0.3 ng/mL (ref <0.30)

## 2011-11-15 LAB — DIFFERENTIAL
Eosinophils Relative: 2 % (ref 0–5)
Lymphocytes Relative: 40 % (ref 12–46)
Lymphs Abs: 2.3 10*3/uL (ref 0.7–4.0)
Monocytes Absolute: 0.6 10*3/uL (ref 0.1–1.0)
Monocytes Relative: 10 % (ref 3–12)

## 2011-11-15 LAB — COMPREHENSIVE METABOLIC PANEL
Alkaline Phosphatase: 85 U/L (ref 39–117)
BUN: 9 mg/dL (ref 6–23)
CO2: 24 mEq/L (ref 19–32)
Chloride: 107 mEq/L (ref 96–112)
Creatinine, Ser: 0.73 mg/dL (ref 0.50–1.10)
GFR calc non Af Amer: 82 mL/min — ABNORMAL LOW (ref >90)
Potassium: 3.7 mEq/L (ref 3.5–5.1)
Total Bilirubin: 0.3 mg/dL (ref 0.3–1.2)

## 2011-11-15 LAB — CBC
HCT: 34.7 % — ABNORMAL LOW (ref 36.0–46.0)
Hemoglobin: 11.2 g/dL — ABNORMAL LOW (ref 12.0–15.0)
MCV: 86.8 fL (ref 78.0–100.0)
RBC: 4 MIL/uL (ref 3.87–5.11)
WBC: 5.7 10*3/uL (ref 4.0–10.5)

## 2011-11-15 LAB — LIPID PANEL
Cholesterol: 242 mg/dL — ABNORMAL HIGH (ref 0–200)
LDL Cholesterol: 166 mg/dL — ABNORMAL HIGH (ref 0–99)
Triglycerides: 152 mg/dL — ABNORMAL HIGH (ref <150)

## 2011-11-15 MED ORDER — CLOPIDOGREL BISULFATE 75 MG PO TABS
75.0000 mg | ORAL_TABLET | Freq: Every day | ORAL | Status: DC
  Administered 2011-11-15 – 2011-11-18 (×4): 75 mg via ORAL
  Filled 2011-11-15 (×5): qty 1

## 2011-11-15 MED ORDER — ASPIRIN 325 MG PO TABS
325.0000 mg | ORAL_TABLET | Freq: Every day | ORAL | Status: DC
  Administered 2011-11-15 – 2011-11-18 (×4): 325 mg via ORAL
  Filled 2011-11-15 (×4): qty 1

## 2011-11-15 MED ORDER — BIOTENE DRY MOUTH MT LIQD
15.0000 mL | Freq: Two times a day (BID) | OROMUCOSAL | Status: DC
  Administered 2011-11-15 – 2011-11-18 (×8): 15 mL via OROMUCOSAL

## 2011-11-15 MED ORDER — ALPRAZOLAM 0.25 MG PO TABS
0.2500 mg | ORAL_TABLET | Freq: Every day | ORAL | Status: DC | PRN
  Administered 2011-11-15 – 2011-11-16 (×3): 0.5 mg via ORAL
  Administered 2011-11-17: 0.25 mg via ORAL
  Filled 2011-11-15: qty 2
  Filled 2011-11-15 (×3): qty 1
  Filled 2011-11-15 (×2): qty 2

## 2011-11-15 MED ORDER — METOPROLOL SUCCINATE ER 50 MG PO TB24
50.0000 mg | ORAL_TABLET | Freq: Every day | ORAL | Status: DC
  Administered 2011-11-15 – 2011-11-18 (×4): 50 mg via ORAL
  Filled 2011-11-15 (×4): qty 1

## 2011-11-15 MED ORDER — FOLIC ACID 1 MG PO TABS
1.0000 mg | ORAL_TABLET | Freq: Every day | ORAL | Status: DC
  Administered 2011-11-15 – 2011-11-18 (×4): 1 mg via ORAL
  Filled 2011-11-15 (×4): qty 1

## 2011-11-15 MED ORDER — FUROSEMIDE 10 MG/ML IJ SOLN
40.0000 mg | Freq: Every day | INTRAMUSCULAR | Status: DC
  Administered 2011-11-16 – 2011-11-17 (×2): 40 mg via INTRAVENOUS
  Filled 2011-11-15 (×4): qty 4

## 2011-11-15 MED ORDER — ATORVASTATIN CALCIUM 40 MG PO TABS
40.0000 mg | ORAL_TABLET | Freq: Every day | ORAL | Status: DC
  Administered 2011-11-15 – 2011-11-16 (×2): 40 mg via ORAL
  Filled 2011-11-15 (×4): qty 1

## 2011-11-15 MED ORDER — VITAMIN E 180 MG (400 UNIT) PO CAPS
400.0000 [IU] | ORAL_CAPSULE | Freq: Every day | ORAL | Status: DC
  Administered 2011-11-15 – 2011-11-18 (×4): 400 [IU] via ORAL
  Filled 2011-11-15 (×4): qty 1

## 2011-11-15 MED ORDER — AMIODARONE HCL 100 MG PO TABS
100.0000 mg | ORAL_TABLET | Freq: Every day | ORAL | Status: DC
  Administered 2011-11-15 – 2011-11-18 (×4): 100 mg via ORAL
  Filled 2011-11-15 (×4): qty 1

## 2011-11-15 MED ORDER — ACETAMINOPHEN 325 MG PO TABS
650.0000 mg | ORAL_TABLET | ORAL | Status: DC | PRN
  Administered 2011-11-15 – 2011-11-18 (×6): 650 mg via ORAL
  Filled 2011-11-15 (×5): qty 2

## 2011-11-15 MED ORDER — NITROGLYCERIN IN D5W 200-5 MCG/ML-% IV SOLN
2.0000 ug/min | INTRAVENOUS | Status: DC
  Administered 2011-11-16: 10 ug/min via INTRAVENOUS
  Filled 2011-11-15: qty 250

## 2011-11-15 MED ORDER — FERROUS SULFATE 325 (65 FE) MG PO TABS
325.0000 mg | ORAL_TABLET | Freq: Every day | ORAL | Status: DC
  Administered 2011-11-15 – 2011-11-18 (×4): 325 mg via ORAL
  Filled 2011-11-15 (×5): qty 1

## 2011-11-15 MED ORDER — PRIMIDONE 50 MG PO TABS
100.0000 mg | ORAL_TABLET | Freq: Two times a day (BID) | ORAL | Status: DC
  Administered 2011-11-15 – 2011-11-18 (×7): 100 mg via ORAL
  Filled 2011-11-15 (×9): qty 2

## 2011-11-15 MED ORDER — CLOPIDOGREL BISULFATE 300 MG PO TABS
300.0000 mg | ORAL_TABLET | Freq: Once | ORAL | Status: AC
  Administered 2011-11-15: 300 mg via ORAL
  Filled 2011-11-15: qty 1

## 2011-11-15 MED ORDER — SODIUM CHLORIDE 0.9 % IV SOLN
INTRAVENOUS | Status: DC | PRN
  Administered 2011-11-15 – 2011-11-17 (×2): 10 mL/h via INTRAVENOUS

## 2011-11-15 MED ORDER — PANTOPRAZOLE SODIUM 40 MG PO TBEC
80.0000 mg | DELAYED_RELEASE_TABLET | Freq: Every day | ORAL | Status: DC
  Administered 2011-11-15 – 2011-11-17 (×3): 80 mg via ORAL
  Filled 2011-11-15 (×4): qty 1
  Filled 2011-11-15: qty 2

## 2011-11-15 MED ORDER — POTASSIUM CHLORIDE CRYS ER 20 MEQ PO TBCR
20.0000 meq | EXTENDED_RELEASE_TABLET | Freq: Two times a day (BID) | ORAL | Status: DC
  Administered 2011-11-15 – 2011-11-18 (×7): 20 meq via ORAL
  Filled 2011-11-15 (×8): qty 1

## 2011-11-15 MED ORDER — SODIUM CHLORIDE 0.9 % IV SOLN: INTRAVENOUS | Status: DC

## 2011-11-15 MED ORDER — ONDANSETRON HCL 4 MG PO TABS: 4.0000 mg | ORAL_TABLET | Freq: Three times a day (TID) | ORAL | Status: DC | PRN

## 2011-11-15 MED ORDER — FERROUS SULFATE 325 (65 FE) MG PO TABS
325.0000 mg | ORAL_TABLET | Freq: Every day | ORAL | Status: DC
  Administered 2011-11-15 – 2011-11-17 (×3): 325 mg via ORAL
  Filled 2011-11-15 (×4): qty 1

## 2011-11-15 MED ORDER — ISOSORBIDE MONONITRATE ER 30 MG PO TB24
30.0000 mg | ORAL_TABLET | Freq: Every day | ORAL | Status: DC
  Filled 2011-11-15: qty 1

## 2011-11-15 NOTE — H&P (Signed)
[name] is an 76 y.o. female.   Chief Complaint: Chest pain HPI: Patient is a 76 yr old woman with a history of CAD s/p PCI who presents with chest pain. She was well until last Sunday when she started having lightheadedness, diaphoresis and weakness after returning home from church. She then started having on and off chest pains which she describes very vaguely. It appears onset of chest pain has no correlation with any activities. She had nausea but no vomiting.She denies radiation to the shoulder or jaw. Pain was incompletely relieved by NTG prompting her to go to her PCP Dr. Margo [name] who referred her to the [facility] ER. I was called by DR. Lynelle [name] to transfer her to [facility]. Her history is significant for 2 vessel CAD for which she had stents in 2010 to the proximal and mid RCA (75% to 0%) and FFR of a 60% LAD lesion which was 0.98 at baseline and 0.9 with adenosine. This essentially ruled out significant stenosis.  Her 2D echo that time revealed a normal EF of 60-65% and a LA diameter of 3.5cm. Patient had pain last year so she underwent a lexiscan Technetium nuclear stress test in August 2012 which was negative.   Results for orders placed during the hospital encounter of 11/14/11  APTT      Component Value Range   aPTT 31  24 - 37 (seconds)  PROTIME-INR      Component Value Range   Prothrombin Time 13.1  11.6 - 15.2 (seconds)   INR 0.97  0.00 - 1.49   POCT I-STAT TROPONIN I      Component Value Range   Troponin i, poc 0.01  0.00 - 0.08 (ng/mL)   Comment 3           CBC      Component Value Range   WBC 5.5  4.0 - 10.5 (K/uL)   RBC 4.39  3.87 - 5.11 (MIL/uL)   Hemoglobin 12.1  12.0 - 15.0 (g/dL)   HCT 16.1  09.6 - 04.5 (%)   MCV 87.7  78.0 - 100.0 (fL)   MCH 27.6  26.0 - 34.0 (pg)   MCHC 31.4  30.0 - 36.0 (g/dL)   RDW 40.9 (*) 81.1 - 15.5 (%)   Platelets 217  150 - 400 (K/uL)  DIFFERENTIAL      Component Value Range   Neutrophils Relative 42 (*) 43 - 77 (%)   Neutro Abs 2.3   1.7 - 7.7 (K/uL)   Lymphocytes Relative 47 (*) 12 - 46 (%)   Lymphs Abs 2.6  0.7 - 4.0 (K/uL)   Monocytes Relative 10  3 - 12 (%)   Monocytes Absolute 0.6  0.1 - 1.0 (K/uL)   Eosinophils Relative 2  0 - 5 (%)   Eosinophils Absolute 0.1  0.0 - 0.7 (K/uL)   Basophils Relative 0  0 - 1 (%)   Basophils Absolute 0.0  0.0 - 0.1 (K/uL)  COMPREHENSIVE METABOLIC PANEL      Component Value Range   Sodium 140  135 - 145 (mEq/L)   Potassium 3.9  3.5 - 5.1 (mEq/L)   Chloride 104  96 - 112 (mEq/L)   CO2 25  19 - 32 (mEq/L)   Glucose, Bld 92  70 - 99 (mg/dL)   BUN 10  6 - 23 (mg/dL)   Creatinine, Ser 9.14  0.50 - 1.10 (mg/dL)   Calcium 78.2  8.4 - 10.5 (mg/dL)   Total Protein  8.1  6.0 - 8.3 (g/dL)   Albumin 4.0  3.5 - 5.2 (g/dL)   AST 23  0 - 37 (U/L)   ALT 14  0 - 35 (U/L)   Alkaline Phosphatase 98  39 - 117 (U/L)   Total Bilirubin 0.3  0.3 - 1.2 (mg/dL)   GFR calc non Af Amer 82 (*) >90 (mL/min)   GFR calc Af Amer >90  >90 (mL/min)  POCT I-STAT TROPONIN I      Component Value Range   Troponin i, poc 0.01  0.00 - 0.08 (ng/mL)   Comment 3           PRO B NATRIURETIC PEPTIDE      Component Value Range   Pro B Natriuretic peptide (BNP) 270.1 (*) 0 - 125 (pg/mL)  MRSA PCR SCREENING      Component Value Range   MRSA by PCR NEGATIVE  NEGATIVE   CARDIAC PANEL(CRET KIN+CKTOT+MB+TROPI)      Component Value Range   Total CK 77  7 - 177 (U/L)   CK, MB 2.6  0.3 - 4.0 (ng/mL)   Troponin I <0.30  <0.30 (ng/mL)   Relative Index RELATIVE INDEX IS INVALID  0.0 - 2.5   APTT      Component Value Range   aPTT 72 (*) 24 - 37 (seconds)  PROTIME-INR      Component Value Range   Prothrombin Time 14.2  11.6 - 15.2 (seconds)   INR 1.08  0.00 - 1.49   CBC      Component Value Range   WBC 5.7  4.0 - 10.5 (K/uL)   RBC 4.00  3.87 - 5.11 (MIL/uL)   Hemoglobin 11.2 (*) 12.0 - 15.0 (g/dL)   HCT 47.8 (*) 29.5 - 46.0 (%)   MCV 86.8  78.0 - 100.0 (fL)   MCH 28.0  26.0 - 34.0 (pg)   MCHC 32.3  30.0 - 36.0  (g/dL)   RDW 62.1 (*) 30.8 - 15.5 (%)   Platelets 181  150 - 400 (K/uL)  DIFFERENTIAL      Component Value Range   Neutrophils Relative 48  43 - 77 (%)   Neutro Abs 2.7  1.7 - 7.7 (K/uL)   Lymphocytes Relative 40  12 - 46 (%)   Lymphs Abs 2.3  0.7 - 4.0 (K/uL)   Monocytes Relative 10  3 - 12 (%)   Monocytes Absolute 0.6  0.1 - 1.0 (K/uL)   Eosinophils Relative 2  0 - 5 (%)   Eosinophils Absolute 0.1  0.0 - 0.7 (K/uL)   Basophils Relative 0  0 - 1 (%)   Basophils Absolute 0.0  0.0 - 0.1 (K/uL)  COMPREHENSIVE METABOLIC PANEL      Component Value Range   Sodium 141  135 - 145 (mEq/L)   Potassium 3.7  3.5 - 5.1 (mEq/L)   Chloride 107  96 - 112 (mEq/L)   CO2 24  19 - 32 (mEq/L)   Glucose, Bld 89  70 - 99 (mg/dL)   BUN 9  6 - 23 (mg/dL)   Creatinine, Ser 6.57  0.50 - 1.10 (mg/dL)   Calcium 9.3  8.4 - 84.6 (mg/dL)   Total Protein 6.8  6.0 - 8.3 (g/dL)   Albumin 3.4 (*) 3.5 - 5.2 (g/dL)   AST 18  0 - 37 (U/L)   ALT 12  0 - 35 (U/L)   Alkaline Phosphatase 85  39 - 117 (U/L)   Total Bilirubin 0.3  0.3 - 1.2 (mg/dL)   GFR calc non Af Amer 82 (*) >90 (mL/min)   GFR calc Af Amer >90  >90 (mL/min)   esswhich effectively ruling out significant obstruction.  Past Medical History  Diagnosis Date  . Atrial fibrillation   . CAD (coronary artery disease)     DES x 2 to RCA 10/10  . GERD (gastroesophageal reflux disease)   . Tachycardia-bradycardia syndrome     s/p Medtronic Adapta L dual chamber device  5/10  . Hyperlipidemia   . COPD (chronic obstructive pulmonary disease)   . Anxiety   . Resting tremor   . 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 Chronic back pain   . Pericardial effusion     Hemorrhagic   . Hypertension   . Angina   . Pacemaker   . CHF (congestive heart failure)   . Shortness of breath   . Headache   . H/O hiatal hernia   . Neuromuscular disorder     tremors  . Anemia     Past Surgical History  Procedure Date  . Cholecystectomy   .  Appendectomy   . Subxiphoid pericardial window 11/10  . Esophagogastroduodenoscopy with esophageal dilation 2004, 2006, 2007  . Vaginal hysterectomy   . Colonoscopy 2011  . Right rotator cuff repair   . Cardiac stent x 2   . Dual pacemaker.   . Coronary angioplasty   . Insert / replace / remove pacemaker     Family History  Problem Relation Age of Onset  . Cancer Mother     Colon   . Coronary artery disease Sister   . Coronary artery disease Brother    Social History:  reports that she has never smoked. She does not have any smokeless tobacco history on file. She reports that she does not drink alcohol or use illicit drugs.  Allergies:  Allergies  Allergen Reactions  . Amitriptyline Hcl Other (See Comments)    Caused "jaws to twist and lock"  . Sulfonamide Derivatives     UNKNOWN REACTION    Medications Prior to Admission  Medication Dose Route Frequency Provider Last Rate Last Dose  . 0.9 %  sodium chloride infusion   Intravenous Continuous PRN Luis Abed, MD 10 mL/hr at 11/15/11 0041 10 mL/hr at 11/15/11 0041  . acetaminophen (TYLENOL) tablet 650 mg  650 mg Oral Q4H PRN Luis Abed, MD      . ALPRAZolam Prudy Feeler) tablet 0.25-0.5 mg  0.25-0.5 mg Oral Daily PRN Luis Abed, MD   0.5 mg at 11/15/11 0146  . amiodarone (PACERONE) tablet 100 mg  100 mg Oral Daily Luis Abed, MD      . antiseptic oral rinse (BIOTENE) solution 15 mL  15 mL Mouth Rinse BID Luis Abed, MD   15 mL at 11/15/11 0041  . aspirin chewable tablet 324 mg  324 mg Oral Once Ward Givens, MD   324 mg at 11/14/11 2042  . aspirin tablet 325 mg  325 mg Oral Daily Luis Abed, MD      . atorvastatin (LIPITOR) tablet 40 mg  40 mg Oral q1800 Luis Abed, MD      . clopidogrel (PLAVIX) tablet 300 mg  300 mg Oral Once Luis Abed, MD   300 mg at 11/15/11 0145  . clopidogrel (PLAVIX) tablet 75 mg  75 mg Oral Q breakfast Luis Abed, MD      .  ferrous sulfate tablet 325 mg  325 mg Oral QHS  Luis Abed, MD      . ferrous sulfate tablet 325 mg  325 mg Oral Q breakfast Luis Abed, MD      . folic acid (FOLVITE) tablet 1 mg  1 mg Oral Daily Luis Abed, MD      . furosemide (LASIX) injection 40 mg  40 mg Intravenous Daily Luis Abed, MD      . heparin ADULT infusion 100 units/mL (25000 units/250 mL)  1,000 Units/hr Intravenous Continuous Ward Givens, MD 10 mL/hr at 11/15/11 0000 1,000 Units/hr at 11/15/11 0000  . heparin bolus via infusion 3,000 Units  3,000 Units Intravenous Once Ward Givens, MD   3,000 Units at 11/14/11 2052  . isosorbide mononitrate (IMDUR) 24 hr tablet 30 mg  30 mg Oral QHS Luis Abed, MD      . metoprolol succinate (TOPROL-XL) 24 hr tablet 50 mg  50 mg Oral Daily Luis Abed, MD      . morphine 2 MG/ML injection 2 mg  2 mg Intravenous Once Ward Givens, MD   2 mg at 11/14/11 2229  . morphine 4 MG/ML injection        4 mg at 11/14/11 2141  . nitroGLYCERIN (NITROGLYN) 2 % ointment 1 inch  1 inch Topical Once Ward Givens, MD   1 inch at 11/14/11 1500  . nitroGLYCERIN 0.2 mg/mL in dextrose 5 % infusion  5-200 mcg/min Intravenous Once Ward Givens, MD 9 mL/hr at 11/15/11 0030 30 mcg/min at 11/15/11 0030  . ondansetron (ZOFRAN) tablet 4 mg  4 mg Oral Q8H PRN Luis Abed, MD      . pantoprazole (PROTONIX) EC tablet 80 mg  80 mg Oral Q1200 Luis Abed, MD      . potassium chloride SA (K-DUR,KLOR-CON) CR tablet 20 mEq  20 mEq Oral BID Luis Abed, MD      . primidone (MYSOLINE) tablet 100 mg  100 mg Oral BID Luis Abed, MD      . vitamin E capsule 400 Units  400 Units Oral Daily Luis Abed, MD      . DISCONTD: 0.9 %  sodium chloride infusion   Intravenous Continuous Ward Givens, MD       Medications Prior to Admission  Medication Sig Dispense Refill  . ALPRAZolam (XANAX) 0.5 MG tablet Take 0.25-0.5 mg by mouth daily as needed. For anxiety      . amiodarone (PACERONE) 100 MG tablet Take 100 mg by mouth every morning. Decrease in dose       . aspirin 325 MG tablet Take 325 mg by mouth daily.        . B Complex Vitamins (B COMPLEX-B12 PO) Take 1 tablet by mouth every morning.       . clopidogrel (PLAVIX) 75 MG tablet Take 75 mg by mouth every morning.      Marland Kitchen esomeprazole (NEXIUM) 40 MG capsule Take 40 mg by mouth 2 (two) times daily.       . ferrous sulfate 325 (65 FE) MG tablet Take 325 mg by mouth at bedtime. IRON SUPPLEMENT FOR ANEMIA.      . furosemide (LASIX) 40 MG tablet Take 40 mg by mouth as directed. Take 1 tablet am and 1/2 pm       . isosorbide mononitrate (IMDUR) 30 MG 24 hr tablet Take 30 mg by mouth at bedtime.      Marland Kitchen  metoprolol (TOPROL-XL) 50 MG 24 hr tablet Take 50 mg by mouth every morning.        . nitroGLYCERIN (NITROSTAT) 0.4 MG SL tablet Place 0.4 mg under the tongue. Take as directed for chest pain       . potassium chloride SA (K-DUR,KLOR-CON) 20 MEQ tablet Take 20 mEq by mouth 2 (two) times daily.      . primidone (MYSOLINE) 50 MG tablet Take 100 mg by mouth 2 (two) times daily.       . rosuvastatin (CRESTOR) 20 MG tablet Take 20 mg by mouth at bedtime.       . ondansetron (ZOFRAN) 4 MG tablet Take 4 mg by mouth. As needed for nausea       . vitamin E 400 UNIT capsule Take 400 Units by mouth daily.          Results for orders placed during the hospital encounter of 11/14/11 (from the past 48 hour(s))  POCT I-STAT TROPONIN I     Status: Normal   Collection Time   11/14/11  2:24 PM      Component Value Range Comment   Troponin i, poc 0.01  0.00 - 0.08 (ng/mL)    Comment 3            APTT     Status: Normal   Collection Time   11/14/11  2:44 PM      Component Value Range Comment   aPTT 31  24 - 37 (seconds)   PROTIME-INR     Status: Normal   Collection Time   11/14/11  2:44 PM      Component Value Range Comment   Prothrombin Time 13.1  11.6 - 15.2 (seconds)    INR 0.97  0.00 - 1.49    CBC     Status: Abnormal   Collection Time   11/14/11  2:44 PM      Component Value Range Comment   WBC 5.5  4.0  - 10.5 (K/uL)    RBC 4.39  3.87 - 5.11 (MIL/uL)    Hemoglobin 12.1  12.0 - 15.0 (g/dL)    HCT 16.1  09.6 - 04.5 (%)    MCV 87.7  78.0 - 100.0 (fL)    MCH 27.6  26.0 - 34.0 (pg)    MCHC 31.4  30.0 - 36.0 (g/dL)    RDW 40.9 (*) 81.1 - 15.5 (%)    Platelets 217  150 - 400 (K/uL)   DIFFERENTIAL     Status: Abnormal   Collection Time   11/14/11  2:44 PM      Component Value Range Comment   Neutrophils Relative 42 (*) 43 - 77 (%)    Neutro Abs 2.3  1.7 - 7.7 (K/uL)    Lymphocytes Relative 47 (*) 12 - 46 (%)    Lymphs Abs 2.6  0.7 - 4.0 (K/uL)    Monocytes Relative 10  3 - 12 (%)    Monocytes Absolute 0.6  0.1 - 1.0 (K/uL)    Eosinophils Relative 2  0 - 5 (%)    Eosinophils Absolute 0.1  0.0 - 0.7 (K/uL)    Basophils Relative 0  0 - 1 (%)    Basophils Absolute 0.0  0.0 - 0.1 (K/uL)   COMPREHENSIVE METABOLIC PANEL     Status: Abnormal   Collection Time   11/14/11  2:44 PM      Component Value Range Comment   Sodium 140  135 - 145 (mEq/L)  Potassium 3.9  3.5 - 5.1 (mEq/L)    Chloride 104  96 - 112 (mEq/L)    CO2 25  19 - 32 (mEq/L)    Glucose, Bld 92  70 - 99 (mg/dL)    BUN 10  6 - 23 (mg/dL)    Creatinine, Ser 4.78  0.50 - 1.10 (mg/dL)    Calcium 29.5  8.4 - 10.5 (mg/dL)    Total Protein 8.1  6.0 - 8.3 (g/dL)    Albumin 4.0  3.5 - 5.2 (g/dL)    AST 23  0 - 37 (U/L)    ALT 14  0 - 35 (U/L)    Alkaline Phosphatase 98  39 - 117 (U/L)    Total Bilirubin 0.3  0.3 - 1.2 (mg/dL)    GFR calc non Af Amer 82 (*) >90 (mL/min)    GFR calc Af Amer >90  >90 (mL/min)   PRO B NATRIURETIC PEPTIDE     Status: Abnormal   Collection Time   11/14/11  2:44 PM      Component Value Range Comment   Pro B Natriuretic peptide (BNP) 270.1 (*) 0 - 125 (pg/mL)   POCT I-STAT TROPONIN I     Status: Normal   Collection Time   11/14/11  6:07 PM      Component Value Range Comment   Troponin i, poc 0.01  0.00 - 0.08 (ng/mL)    Comment 3            MRSA PCR SCREENING     Status: Normal   Collection Time    11/14/11 11:47 PM      Component Value Range Comment   MRSA by PCR NEGATIVE  NEGATIVE    CARDIAC PANEL(CRET KIN+CKTOT+MB+TROPI)     Status: Normal   Collection Time   11/15/11  2:59 AM      Component Value Range Comment   Total CK 77  7 - 177 (U/L)    CK, MB 2.6  0.3 - 4.0 (ng/mL)    Troponin I <0.30  <0.30 (ng/mL)    Relative Index RELATIVE INDEX IS INVALID  0.0 - 2.5    APTT     Status: Abnormal   Collection Time   11/15/11  2:59 AM      Component Value Range Comment   aPTT 72 (*) 24 - 37 (seconds)   PROTIME-INR     Status: Normal   Collection Time   11/15/11  2:59 AM      Component Value Range Comment   Prothrombin Time 14.2  11.6 - 15.2 (seconds)    INR 1.08  0.00 - 1.49    CBC     Status: Abnormal   Collection Time   11/15/11  2:59 AM      Component Value Range Comment   WBC 5.7  4.0 - 10.5 (K/uL)    RBC 4.00  3.87 - 5.11 (MIL/uL)    Hemoglobin 11.2 (*) 12.0 - 15.0 (g/dL)    HCT 62.1 (*) 30.8 - 46.0 (%)    MCV 86.8  78.0 - 100.0 (fL)    MCH 28.0  26.0 - 34.0 (pg)    MCHC 32.3  30.0 - 36.0 (g/dL)    RDW 65.7 (*) 84.6 - 15.5 (%)    Platelets 181  150 - 400 (K/uL)   DIFFERENTIAL     Status: Normal   Collection Time   11/15/11  2:59 AM      Component Value Range Comment  Neutrophils Relative 48  43 - 77 (%)    Neutro Abs 2.7  1.7 - 7.7 (K/uL)    Lymphocytes Relative 40  12 - 46 (%)    Lymphs Abs 2.3  0.7 - 4.0 (K/uL)    Monocytes Relative 10  3 - 12 (%)    Monocytes Absolute 0.6  0.1 - 1.0 (K/uL)    Eosinophils Relative 2  0 - 5 (%)    Eosinophils Absolute 0.1  0.0 - 0.7 (K/uL)    Basophils Relative 0  0 - 1 (%)    Basophils Absolute 0.0  0.0 - 0.1 (K/uL)   COMPREHENSIVE METABOLIC PANEL     Status: Abnormal   Collection Time   11/15/11  2:59 AM      Component Value Range Comment   Sodium 141  135 - 145 (mEq/L)    Potassium 3.7  3.5 - 5.1 (mEq/L)    Chloride 107  96 - 112 (mEq/L)    CO2 24  19 - 32 (mEq/L)    Glucose, Bld 89  70 - 99 (mg/dL)    BUN 9  6 - 23  (mg/dL)    Creatinine, Ser 9.52  0.50 - 1.10 (mg/dL)    Calcium 9.3  8.4 - 10.5 (mg/dL)    Total Protein 6.8  6.0 - 8.3 (g/dL)    Albumin 3.4 (*) 3.5 - 5.2 (g/dL)    AST 18  0 - 37 (U/L)    ALT 12  0 - 35 (U/L)    Alkaline Phosphatase 85  39 - 117 (U/L)    Total Bilirubin 0.3  0.3 - 1.2 (mg/dL)    GFR calc non Af Amer 82 (*) >90 (mL/min)    GFR calc Af Amer >90  >90 (mL/min)    Dg Chest Port 1 View  11/14/2011  *RADIOLOGY REPORT*  Clinical Data: Chest pain  PORTABLE CHEST - 1 VIEW  Comparison: Chest radiograph 06/02/2011, CT 06/03/2011  Findings: Left-sided pacemaker overlies stable enlarged heart silhouette.  No effusion, infiltrate, or pneumothorax.  Mild basilar atelectasis.  IMPRESSION:  No acute findings.  Mild basilar atelectasis.  Original Report Authenticated By: Genevive Bi, M.D.    Review of Systems  Constitutional: Positive for diaphoresis.  HENT: Negative.   Eyes: Negative.   Respiratory: Positive for shortness of breath.   Cardiovascular: Positive for chest pain and PND. Negative for palpitations, orthopnea, claudication and leg swelling.  Gastrointestinal: Positive for nausea.  Genitourinary: Negative.   Musculoskeletal: Negative.   Skin: Negative.   Neurological: Positive for tremors and weakness.  Endo/Heme/Allergies: Negative.   Psychiatric/Behavioral: Negative.     Blood pressure 116/62, pulse 60, temperature 97.9 F (36.6 C), temperature source Oral, resp. rate 18, height 5\' 5"  (1.651 m), weight 154 lb 15.7 oz (70.3 kg), SpO2 99.00%. Physical Exam  Constitutional: She is oriented to person, place, and time. No distress.       Having generalized coarse tremors.  HENT:  Head: Normocephalic.  Mouth/Throat: No oropharyngeal exudate.  Eyes: Conjunctivae are normal.  Neck: JVD present. Mass present. No thyromegaly present.    Cardiovascular: Normal rate, regular rhythm, S1 normal and S2 normal.   Murmur heard.  Systolic murmur is present with a grade of  1/6  Pulses:      Dorsalis pedis pulses are 1+ on the right side, and 1+ on the left side.       Posterior tibial pulses are 0 on the right side, and 0 on the left side.  Respiratory: No stridor. She has decreased breath sounds in the right lower field and the left lower field. She has no wheezes. She has no rhonchi. She has rales in the left lower field.    GI: Soft. She exhibits distension. There is no tenderness.  Musculoskeletal:       Right ankle: She exhibits no swelling.       Left ankle: She exhibits no swelling.  Lymphadenopathy:    She has no cervical adenopathy.  Neurological: She is alert and oriented to person, place, and time. She displays tremor.  Skin: She is not diaphoretic.     Psychiatric: Her mood appears anxious.    EKG Sinus rhythm, T wave inversion in V3 and V4  Assessment/Plan Atypical chest pain but with worrisome features in an elderly  patient with known CAD s/p DES to RCA with a residual 60% lesion to the LAD. Her EKG revealed new T wave inversion but negative troponin. Of note patient has a relatively recent normal nuclear stress test. Will treat her for unstable angina with ASA, Plavix, transient Heparin drip, BB ACE, Statin, Lasix and Nitro drip. Given the recent nuclear stress test that was negative, it is unlikely she has a high grade stenosis however, that does not preclude her from rupturing plaques. Plan for now is to treat this episode medically without further invasive work up at this time.     Grandville Silos 11/15/2011, 4:18 AM

## 2011-11-15 NOTE — Progress Notes (Signed)
Patient ID: [name], female   DOB: [birth year], 76 y.o.   MRN: [id number]    Subjective:  Vague SSCP continues Objective:  Filed Vitals:   11/15/11 0400 11/15/11 0403 11/15/11 0750 11/15/11 0800  BP: 104/57  112/63   Pulse: 61  64   Temp:  97.9 F (36.6 C)  97.8 F (36.6 C)  TempSrc:  Oral  Oral  Resp: 24  17   Height:      Weight:      SpO2: 99%  99%     Intake/Output from previous day:  Intake/Output Summary (Last 24 hours) at 11/15/11 1002 Last data filed at 11/15/11 0900  Gross per 24 hour  Intake 280.67 ml  Output    275 ml  Net   5.67 ml    Physical Exam: Affect appropriate Elderly frail female HEENT: normal Neck supple with no adenopathy JVP normal no bruits no thyromegaly Lungs clear with no wheezing and good diaphragmatic motion Heart:  S1/S2 no murmur, no rub, gallop or click PMI normal Abdomen: benighn, BS positve, no tenderness, no AAA no bruit.  No HSM or HJR Distal pulses intact with no bruits No edema Neuro non-focal Tremor in UE;s worse in RUE Skin warm and dry No muscular weakness   Lab Results: Basic Metabolic Panel:  Basename 11/15/11 0259 11/14/11 1444  NA 141 140  K 3.7 3.9  CL 107 104  CO2 24 25  GLUCOSE 89 92  BUN 9 10  CREATININE 0.73 0.73  CALCIUM 9.3 10.2  MG -- --  PHOS -- --   Liver Function Tests:  Basename 11/15/11 0259 11/14/11 1444  AST 18 23  ALT 12 14  ALKPHOS 85 98  BILITOT 0.3 0.3  PROT 6.8 8.1  ALBUMIN 3.4* 4.0   No results found for this basename: LIPASE:2,AMYLASE:2 in the last 72 hours CBC:  Basename 11/15/11 0259 11/14/11 1444  WBC 5.7 5.5  NEUTROABS 2.7 2.3  HGB 11.2* 12.1  HCT 34.7* 38.5  MCV 86.8 87.7  PLT 181 217   Cardiac Enzymes:  Basename 11/15/11 0640 11/15/11 0259  CKTOTAL 70 77  CKMB 2.4 2.6  CKMBINDEX -- --  TROPONINI <0.30 <0.30   BNP: No components found with this basename: POCBNP:3 D-Dimer: No results found for this basename: DDIMER:2 in the last 72 hours Hemoglobin  A1C: No results found for this basename: HGBA1C in the last 72 hours Fasting Lipid Panel:  Basename 11/15/11 0640  CHOL 242*  HDL 46  LDLCALC 166*  TRIG 152*  CHOLHDL 5.3  LDLDIRECT --   Thyroid Function Tests: No results found for this basename: TSH,T4TOTAL,FREET3,T3FREE,THYROIDAB in the last 72 hours Anemia Panel: No results found for this basename: VITAMINB12,FOLATE,FERRITIN,TIBC,IRON,RETICCTPCT in the last 72 hours  Imaging: Dg Chest Port 1 View  11/14/2011  *RADIOLOGY REPORT*  Clinical Data: Chest pain  PORTABLE CHEST - 1 VIEW  Comparison: Chest radiograph 06/02/2011, CT 06/03/2011  Findings: Left-sided pacemaker overlies stable enlarged heart silhouette.  No effusion, infiltrate, or pneumothorax.  Mild basilar atelectasis.  IMPRESSION:  No acute findings.  Mild basilar atelectasis.  Original Report Authenticated By: Genevive Bi, M.D.    Cardiac Studies:  ECG:    Telemetry:  A paced with lateral T wave inversions  Echo:   Medications:     . amiodarone  100 mg Oral Daily  . antiseptic oral rinse  15 mL Mouth Rinse BID  . aspirin  324 mg Oral Once  . aspirin  325 mg Oral Daily  .  atorvastatin  40 mg Oral q1800  . clopidogrel  300 mg Oral Once  . clopidogrel  75 mg Oral Q breakfast  . ferrous sulfate  325 mg Oral QHS  . ferrous sulfate  325 mg Oral Q breakfast  . folic acid  1 mg Oral Daily  . furosemide  40 mg Intravenous Daily  . heparin  3,000 Units Intravenous Once  . isosorbide mononitrate  30 mg Oral QHS  . metoprolol succinate  50 mg Oral Daily  . morphine  2 mg Intravenous Once  . morphine      . nitroGLYCERIN  1 inch Topical Once  . nitroGLYCERIN  5-200 mcg/min Intravenous Once  . pantoprazole  80 mg Oral Q1200  . potassium chloride SA  20 mEq Oral BID  . primidone  100 mg Oral BID  . vitamin E  400 Units Oral Daily       . sodium chloride 10 mL/hr (11/15/11 0041)  . heparin 1,000 Units/hr (11/15/11 0000)  . DISCONTD: sodium chloride       Assessment/Plan:  Chest Pain.  Relieved on nitro and heparin  For most part.  Myovue non ischemic 8/12 but known disease Favor cath Monday to risk stratify.  Patient ok with this.  Continue ASA/beta blocker heparin and nitro Tremor:  Continue beta blocker and primidone   Anemia: chronic on FESO4  Charlton Haws 11/15/2011, 10:02 AM

## 2011-11-15 NOTE — Progress Notes (Signed)
ANTICOAGULATION CONSULT NOTE - Initial Consult  Pharmacy Consult for Heparin Indication: chest pain/ACS  Allergies  Allergen Reactions  . Amitriptyline Hcl Other (See Comments)    Caused "jaws to twist and lock"  . Sulfonamide Derivatives     UNKNOWN REACTION    Patient Measurements: Height: 5\' 5"  (165.1 cm) Weight: 154 lb 15.7 oz (70.3 kg) IBW/kg (Calculated) : 57  Heparin Dosing Weight: 71.7kg  Vital Signs: Temp: 97.8 F (36.6 C) (04/13 0800) Temp src: Oral (04/13 0800) BP: 112/63 mmHg (04/13 0750) Pulse Rate: 64  (04/13 0750)  Labs:  Basename 11/15/11 0824 11/15/11 0640 11/15/11 0259 11/14/11 1444  HGB -- -- 11.2* 12.1  HCT -- -- 34.7* 38.5  PLT -- -- 181 217  APTT -- -- 72* 31  LABPROT -- -- 14.2 13.1  INR -- -- 1.08 0.97  HEPARINUNFRC 0.47 -- -- --  CREATININE -- -- 0.73 0.73  CKTOTAL -- 70 77 --  CKMB -- 2.4 2.6 --  TROPONINI -- <0.30 <0.30 --   Estimated Creatinine Clearance: 60.7 ml/min (by C-G formula based on Cr of 0.73).  Medical History: Past Medical History  Diagnosis Date  . Atrial fibrillation   . CAD (coronary artery disease)     DES x 2 to RCA 10/10  . GERD (gastroesophageal reflux disease)   . Tachycardia-bradycardia syndrome     s/p Medtronic Adapta L dual chamber device  5/10  . Hyperlipidemia   . COPD (chronic obstructive pulmonary disease)   . Anxiety   . Resting tremor   . 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 Chronic back pain   . Pericardial effusion     Hemorrhagic   . Hypertension   . Angina   . Pacemaker   . CHF (congestive heart failure)   . Shortness of breath   . Headache   . H/O hiatal hernia   . Neuromuscular disorder     tremors  . Anemia     Medications:  Prescriptions prior to admission  Medication Sig Dispense Refill  . ALPRAZolam (XANAX) 0.5 MG tablet Take 0.25-0.5 mg by mouth daily as needed. For anxiety      . amiodarone (PACERONE) 100 MG tablet Take 100 mg by mouth every morning.  Decrease in dose      . aspirin 325 MG tablet Take 325 mg by mouth daily.        . B Complex Vitamins (B COMPLEX-B12 PO) Take 1 tablet by mouth every morning.       . clopidogrel (PLAVIX) 75 MG tablet Take 75 mg by mouth every morning.      Marland Kitchen esomeprazole (NEXIUM) 40 MG capsule Take 40 mg by mouth 2 (two) times daily.       . ferrous sulfate 325 (65 FE) MG tablet Take 325 mg by mouth at bedtime. IRON SUPPLEMENT FOR ANEMIA.      . folic acid (FOLVITE) 1 MG tablet Take 1 mg by mouth every morning.      . furosemide (LASIX) 40 MG tablet Take 40 mg by mouth as directed. Take 1 tablet am and 1/2 pm       . isosorbide mononitrate (IMDUR) 30 MG 24 hr tablet Take 30 mg by mouth at bedtime.      . metoprolol (TOPROL-XL) 50 MG 24 hr tablet Take 50 mg by mouth every morning.        . nitroGLYCERIN (NITROSTAT) 0.4 MG SL tablet Place 0.4 mg under the  tongue. Take as directed for chest pain       . potassium chloride SA (K-DUR,KLOR-CON) 20 MEQ tablet Take 20 mEq by mouth 2 (two) times daily.      . primidone (MYSOLINE) 50 MG tablet Take 100 mg by mouth 2 (two) times daily.       . rosuvastatin (CRESTOR) 20 MG tablet Take 20 mg by mouth at bedtime.       . ondansetron (ZOFRAN) 4 MG tablet Take 4 mg by mouth. As needed for nausea       . vitamin E 400 UNIT capsule Take 400 Units by mouth daily.          Assessment: 76 yo female with hx DES x2 presented with chest pain and still symptomatic this AM. CE neg x3, myoview neg but patient has history and will go to cath Monday. Heparin level 0.47 this morning, will continue heparin until cath Monday.  Goal of Therapy:  Heparin level 0.3-0.7 units/ml   Plan:  1. Continue heparin 1000 units/hour 2. Daily heparin level/CBC  Hayden Kihara, [place] R 11/15/2011,10:27 AM

## 2011-11-16 LAB — HEPARIN LEVEL (UNFRACTIONATED): Heparin Unfractionated: 0.49 IU/mL (ref 0.30–0.70)

## 2011-11-16 LAB — CBC
MCV: 88.9 fL (ref 78.0–100.0)
Platelets: 178 10*3/uL (ref 150–400)
RBC: 3.89 MIL/uL (ref 3.87–5.11)
RDW: 17.6 % — ABNORMAL HIGH (ref 11.5–15.5)
WBC: 5.1 10*3/uL (ref 4.0–10.5)

## 2011-11-16 NOTE — Progress Notes (Signed)
ANTICOAGULATION CONSULT NOTE - Initial Consult  Pharmacy Consult for Heparin Indication: chest pain/ACS  Allergies  Allergen Reactions  . Amitriptyline Hcl Other (See Comments)    Caused "jaws to twist and lock"  . Sulfonamide Derivatives     UNKNOWN REACTION    Patient Measurements: Height: 5\' 5"  (165.1 cm) Weight: 156 lb 1.4 oz (70.8 kg) IBW/kg (Calculated) : 57  Heparin Dosing Weight: 71.7kg  Vital Signs: Temp: 97.5 F (36.4 C) (04/14 1100) Temp src: Oral (04/14 1100) BP: 118/67 mmHg (04/14 1200) Pulse Rate: 62  (04/14 1200)  Labs:  Basename 11/16/11 0425 11/15/11 1223 11/15/11 0824 11/15/11 0640 11/15/11 0259 11/14/11 1444  HGB 10.9* -- -- -- 11.2* --  HCT 34.6* -- -- -- 34.7* 38.5  PLT 178 -- -- -- 181 217  APTT -- -- -- -- 72* 31  LABPROT -- -- -- -- 14.2 13.1  INR -- -- -- -- 1.08 0.97  HEPARINUNFRC 0.49 -- 0.47 -- -- --  CREATININE -- -- -- -- 0.73 0.73  CKTOTAL -- 66 -- 70 77 --  CKMB -- 2.4 -- 2.4 2.6 --  TROPONINI -- <0.30 -- <0.30 <0.30 --   Estimated Creatinine Clearance: 60.9 ml/min (by C-G formula based on Cr of 0.73).  Medical History: Past Medical History  Diagnosis Date  . Atrial fibrillation   . CAD (coronary artery disease)     DES x 2 to RCA 10/10  . GERD (gastroesophageal reflux disease)   . Tachycardia-bradycardia syndrome     s/p Medtronic Adapta L dual chamber device  5/10  . Hyperlipidemia   . COPD (chronic obstructive pulmonary disease)   . Anxiety   . Resting tremor   . 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 Chronic back pain   . Pericardial effusion     Hemorrhagic   . Hypertension   . Angina   . Pacemaker   . CHF (congestive heart failure)   . Shortness of breath   . Headache   . H/O hiatal hernia   . Neuromuscular disorder     tremors  . Anemia     Medications:  Prescriptions prior to admission  Medication Sig Dispense Refill  . ALPRAZolam (XANAX) 0.5 MG tablet Take 0.25-0.5 mg by mouth daily  as needed. For anxiety      . amiodarone (PACERONE) 100 MG tablet Take 100 mg by mouth every morning. Decrease in dose      . aspirin 325 MG tablet Take 325 mg by mouth daily.        . B Complex Vitamins (B COMPLEX-B12 PO) Take 1 tablet by mouth every morning.       . clopidogrel (PLAVIX) 75 MG tablet Take 75 mg by mouth every morning.      Marland Kitchen esomeprazole (NEXIUM) 40 MG capsule Take 40 mg by mouth 2 (two) times daily.       . ferrous sulfate 325 (65 FE) MG tablet Take 325 mg by mouth at bedtime. IRON SUPPLEMENT FOR ANEMIA.      . folic acid (FOLVITE) 1 MG tablet Take 1 mg by mouth every morning.      . furosemide (LASIX) 40 MG tablet Take 40 mg by mouth as directed. Take 1 tablet am and 1/2 pm       . isosorbide mononitrate (IMDUR) 30 MG 24 hr tablet Take 30 mg by mouth at bedtime.      . metoprolol (TOPROL-XL) 50 MG 24 hr tablet  Take 50 mg by mouth every morning.        . nitroGLYCERIN (NITROSTAT) 0.4 MG SL tablet Place 0.4 mg under the tongue. Take as directed for chest pain       . potassium chloride SA (K-DUR,KLOR-CON) 20 MEQ tablet Take 20 mEq by mouth 2 (two) times daily.      . primidone (MYSOLINE) 50 MG tablet Take 100 mg by mouth 2 (two) times daily.       . rosuvastatin (CRESTOR) 20 MG tablet Take 20 mg by mouth at bedtime.       . ondansetron (ZOFRAN) 4 MG tablet Take 4 mg by mouth. As needed for nausea       . vitamin E 400 UNIT capsule Take 400 Units by mouth daily.          Assessment: 76 yo female with hx DES x2 presented with chest pain and still symptomatic this AM. CE neg x3, myoview neg but patient has history and will go to cath Monday. Heparin level 0.49 this morning<---0.47, will continue heparin until cath Monday.  Goal of Therapy:  Heparin level 0.3-0.7 units/ml   Plan:  1. Continue heparin 1000 units/hour 2. Daily heparin level/CBC  Kellie Murrill, [place] R 11/16/2011,1:18 PM

## 2011-11-16 NOTE — Progress Notes (Signed)
Patient ID: [name], female   DOB: Dec 31, 1935, 76 y.o.   MRN: [id number]    Subjective:  Vague SSCP continues  Objective:  Filed Vitals:   11/16/11 0400 11/16/11 0553 11/16/11 0600 11/16/11 0800  BP: 127/68  136/74 119/65  Pulse: 60  62 60  Temp:    98.2 F (36.8 C)  TempSrc:    Oral  Resp:      Height:      Weight:  156 lb 1.4 oz (70.8 kg)    SpO2: 100%  100% 99%    Intake/Output from previous day:  Intake/Output Summary (Last 24 hours) at 11/16/11 1033 Last data filed at 11/16/11 1000  Gross per 24 hour  Intake    703 ml  Output    551 ml  Net    152 ml    Physical Exam: Affect appropriate Elderly frail female HEENT: normal Neck supple with no adenopathy JVP normal no bruits no thyromegaly Lungs clear with no wheezing and good diaphragmatic motion Heart:  S1/S2 no murmur, no rub, gallop or click PMI normal Abdomen: benighn, BS positve, no tenderness, no AAA no bruit.  No HSM or HJR Distal pulses intact with no bruits No edema Neuro non-focal Tremor in UE;s worse in RUE Skin warm and dry No muscular weakness   Lab Results: Basic Metabolic Panel:  Basename 11/15/11 0259 11/14/11 1444  NA 141 140  K 3.7 3.9  CL 107 104  CO2 24 25  GLUCOSE 89 92  BUN 9 10  CREATININE 0.73 0.73  CALCIUM 9.3 10.2  MG -- --  PHOS -- --   Liver Function Tests:  Basename 11/15/11 0259 11/14/11 1444  AST 18 23  ALT 12 14  ALKPHOS 85 98  BILITOT 0.3 0.3  PROT 6.8 8.1  ALBUMIN 3.4* 4.0   No results found for this basename: LIPASE:2,AMYLASE:2 in the last 72 hours CBC:  Basename 11/16/11 0425 11/15/11 0259 11/14/11 1444  WBC 5.1 5.7 --  NEUTROABS -- 2.7 2.3  HGB 10.9* 11.2* --  HCT 34.6* 34.7* --  MCV 88.9 86.8 --  PLT 178 181 --   Cardiac Enzymes:  Basename 11/15/11 1223 11/15/11 0640 11/15/11 0259  CKTOTAL 66 70 77  CKMB 2.4 2.4 2.6  CKMBINDEX -- -- --  TROPONINI <0.30 <0.30 <0.30   BNP: No components found with this basename:  POCBNP:3 D-Dimer: No results found for this basename: DDIMER:2 in the last 72 hours Hemoglobin A1C: No results found for this basename: HGBA1C in the last 72 hours Fasting Lipid Panel:  Basename 11/15/11 0640  CHOL 242*  HDL 46  LDLCALC 166*  TRIG 152*  CHOLHDL 5.3  LDLDIRECT --   Thyroid Function Tests:  Basename 11/15/11 0259  TSH 1.092  T4TOTAL --  T3FREE --  THYROIDAB --   Anemia Panel: No results found for this basename: VITAMINB12,FOLATE,FERRITIN,TIBC,IRON,RETICCTPCT in the last 72 hours  Imaging: Dg Chest Port 1 View  11/14/2011  *RADIOLOGY REPORT*  Clinical Data: Chest pain  PORTABLE CHEST - 1 VIEW  Comparison: Chest radiograph 06/02/2011, CT 06/03/2011  Findings: Left-sided pacemaker overlies stable enlarged heart silhouette.  No effusion, infiltrate, or pneumothorax.  Mild basilar atelectasis.  IMPRESSION:  No acute findings.  Mild basilar atelectasis.  Original Report Authenticated By: Genevive Bi, M.D.    Cardiac Studies:  Tele :  Apaced NSR no arrhythmia    ECG  A paced with lateral T wave inversions  Echo:   Medications:      .  amiodarone  100 mg Oral Daily  . antiseptic oral rinse  15 mL Mouth Rinse BID  . aspirin  325 mg Oral Daily  . atorvastatin  40 mg Oral q1800  . clopidogrel  75 mg Oral Q breakfast  . ferrous sulfate  325 mg Oral QHS  . ferrous sulfate  325 mg Oral Q breakfast  . folic acid  1 mg Oral Daily  . furosemide  40 mg Intravenous Daily  . metoprolol succinate  50 mg Oral Daily  . pantoprazole  80 mg Oral Q1200  . potassium chloride SA  20 mEq Oral BID  . primidone  100 mg Oral BID  . vitamin E  400 Units Oral Daily  . DISCONTD: isosorbide mononitrate  30 mg Oral QHS  . DISCONTD: nitroGLYCERIN  5-200 mcg/min Intravenous Once        . sodium chloride 10 mL/hr (11/15/11 0041)  . heparin 1,000 Units/hr (11/15/11 1806)  . nitroGLYCERIN 10 mcg/min (11/16/11 0700)    Assessment/Plan:  Chest Pain.  Relieved on nitro and  heparin  For most part.  Myovue non ischemic 8/12 but known disease Favor cath Monday to risk stratify.  Patient ok with this.  Continue ASA/beta blocker heparin and nitro Tremor:  Continue beta blocker and primidone   Anemia: chronic on FESO4  Charlton Haws 11/16/2011, 10:33 AM

## 2011-11-17 ENCOUNTER — Encounter (HOSPITAL_COMMUNITY)
Admission: EM | Disposition: A | Discharge status: Home / Self Care | Payer: Self-pay | Source: Home / Self Care | Attending: Cardiology

## 2011-11-17 DIAGNOSIS — I2510 Atherosclerotic heart disease of native coronary artery without angina pectoris: Secondary | ICD-10-CM

## 2011-11-17 HISTORY — PX: LEFT HEART CATHETERIZATION WITH CORONARY ANGIOGRAM: SHX5451

## 2011-11-17 LAB — CBC
Hemoglobin: 11.7 g/dL — ABNORMAL LOW (ref 12.0–15.0)
MCH: 27.7 pg (ref 26.0–34.0)
Platelets: 196 10*3/uL (ref 150–400)
RBC: 4.22 MIL/uL (ref 3.87–5.11)
WBC: 4.8 10*3/uL (ref 4.0–10.5)

## 2011-11-17 SURGERY — LEFT HEART CATHETERIZATION WITH CORONARY ANGIOGRAM
Anesthesia: LOCAL

## 2011-11-17 MED ORDER — ASPIRIN 81 MG PO CHEW: 324.0000 mg | CHEWABLE_TABLET | ORAL | Status: DC

## 2011-11-17 MED ORDER — ONDANSETRON HCL 4 MG/2ML IJ SOLN: 4.0000 mg | Freq: Four times a day (QID) | INTRAMUSCULAR | Status: DC | PRN

## 2011-11-17 MED ORDER — ASPIRIN EC 325 MG PO TBEC: 325.0000 mg | DELAYED_RELEASE_TABLET | Freq: Every day | ORAL | Status: DC

## 2011-11-17 MED ORDER — SODIUM CHLORIDE 0.9 % IV SOLN: INTRAVENOUS | Status: DC

## 2011-11-17 MED ORDER — LIDOCAINE HCL (PF) 1 % IJ SOLN
INTRAMUSCULAR | Status: AC
  Filled 2011-11-17: qty 30

## 2011-11-17 MED ORDER — OXYCODONE-ACETAMINOPHEN 5-325 MG PO TABS: 1.0000 | ORAL_TABLET | ORAL | Status: DC | PRN

## 2011-11-17 MED ORDER — ALPRAZOLAM 0.25 MG PO TABS
0.2500 mg | ORAL_TABLET | Freq: Two times a day (BID) | ORAL | Status: DC | PRN
  Administered 2011-11-17: 0.5 mg via ORAL

## 2011-11-17 MED ORDER — MIDAZOLAM HCL 2 MG/2ML IJ SOLN
INTRAMUSCULAR | Status: AC
  Filled 2011-11-17: qty 2

## 2011-11-17 MED ORDER — DIAZEPAM 2 MG PO TABS: 2.0000 mg | ORAL_TABLET | ORAL | Status: DC | PRN

## 2011-11-17 MED ORDER — SODIUM CHLORIDE 0.9 % IJ SOLN: 3.0000 mL | INTRAMUSCULAR | Status: DC | PRN

## 2011-11-17 MED ORDER — ACETAMINOPHEN 325 MG PO TABS: 650.0000 mg | ORAL_TABLET | ORAL | Status: DC | PRN

## 2011-11-17 MED ORDER — NITROGLYCERIN 0.2 MG/ML ON CALL CATH LAB
INTRAVENOUS | Status: AC
  Filled 2011-11-17: qty 1

## 2011-11-17 MED ORDER — SODIUM CHLORIDE 0.9 % IV SOLN: 250.0000 mL | INTRAVENOUS | Status: DC

## 2011-11-17 MED ORDER — SODIUM CHLORIDE 0.9 % IV SOLN: 250.0000 mL | INTRAVENOUS | Status: DC | PRN

## 2011-11-17 MED ORDER — SODIUM CHLORIDE 0.9 % IJ SOLN
3.0000 mL | Freq: Two times a day (BID) | INTRAMUSCULAR | Status: DC
  Administered 2011-11-17: 3 mL via INTRAVENOUS

## 2011-11-17 MED ORDER — HEPARIN (PORCINE) IN NACL 2-0.9 UNIT/ML-% IJ SOLN
INTRAMUSCULAR | Status: AC
  Filled 2011-11-17: qty 2000

## 2011-11-17 MED ORDER — DIAZEPAM 5 MG PO TABS
5.0000 mg | ORAL_TABLET | ORAL | Status: AC
  Administered 2011-11-17: 5 mg via ORAL
  Filled 2011-11-17: qty 1

## 2011-11-17 NOTE — Progress Notes (Signed)
    Subjective:  Had some chest pain this am, but feels better now. No dyspnea at rest.  Objective:  Vital Signs in the last 24 hours: Temp:  [97.4 F (36.3 C)-98 F (36.7 C)] 97.8 F (36.6 C) (04/15 0707) Pulse Rate:  [60-63] 63  (04/15 0715) BP: (118-160)/(67-81) 160/77 mmHg (04/15 0715) SpO2:  [95 %-98 %] 98 % (04/15 0715) Weight:  [69.9 kg (154 lb 1.6 oz)] 69.9 kg (154 lb 1.6 oz) (04/15 0401)  Intake/Output from previous day: 04/14 0701 - 04/15 0700 In: 554 [P.O.:120; I.V.:434] Out: 2201 [Urine:2200; Stool:1]  Physical Exam: Pt is alert and oriented, elderly woman in NAD HEENT: normal Neck: JVP - normal, carotids 2+= without bruits Lungs: CTA bilaterally CV: RRR without murmur or gallop Abd: soft, NT, Positive BS, no hepatomegaly Ext: no C/C/E, distal pulses intact and equal Skin: warm/dry no rash   Lab Results:  Basename 11/17/11 0520 11/16/11 0425  WBC 4.8 5.1  HGB 11.7* 10.9*  PLT 196 178    Basename 11/15/11 0259 11/14/11 1444  NA 141 140  K 3.7 3.9  CL 107 104  CO2 24 25  GLUCOSE 89 92  BUN 9 10  CREATININE 0.73 0.73    Basename 11/15/11 1223 11/15/11 0640  TROPONINI <0.30 <0.30   Tele: sinus rhythm  Assessment/Plan:  Unstable angina - on heparin and NTG. For cath today. Known residual CAD. I discussed risks, indication, and alternatives to cath and possible PCI - she understands and agrees to proceed.  Tonny Bollman, M.D. 11/17/2011, 8:00 AM

## 2011-11-17 NOTE — Progress Notes (Signed)
ANTICOAGULATION CONSULT NOTE - Initial Consult  Pharmacy Consult for Heparin Indication: Chest pain/ACS  Allergies  Allergen Reactions  . Amitriptyline Hcl Other (See Comments)    Caused "jaws to twist and lock"  . Sulfonamide Derivatives     UNKNOWN REACTION    Patient Measurements: Height: 5\' 5"  (165.1 cm) Weight: 154 lb 1.6 oz (69.9 kg) IBW/kg (Calculated) : 57  Heparin Dosing Weight: 71.7kg  Vital Signs: Temp: 97.9 F (36.6 C) (04/15 1218) Temp src: Oral (04/15 1218) BP: 149/79 mmHg (04/15 1218) Pulse Rate: 61  (04/15 1218)  Labs:  Alvira Philips 11/17/11 0520 11/16/11 0425 11/15/11 1223 11/15/11 0824 11/15/11 0640 11/15/11 0259  HGB 11.7* 10.9* -- -- -- --  HCT 36.9 34.6* -- -- -- 34.7*  PLT 196 178 -- -- -- 181  HEPARINUNFRC 0.41 0.49 -- 0.47 -- --  CKTOTAL -- -- 66 -- 70 77  CKMB -- -- 2.4 -- 2.4 2.6  TROPONINI -- -- <0.30 -- <0.30 <0.30   Estimated Creatinine Clearance: 60.6 ml/min (by C-G formula based on Cr of 0.73).  Medical History: Past Medical History  Diagnosis Date  . Atrial fibrillation   . CAD (coronary artery disease)     DES x 2 to RCA 10/10  . GERD (gastroesophageal reflux disease)   . Tachycardia-bradycardia syndrome     s/p Medtronic Adapta L dual chamber device  5/10  . Hyperlipidemia   . COPD (chronic obstructive pulmonary disease)   . Anxiety   . Resting tremor   . 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 Chronic back pain   . Pericardial effusion     Hemorrhagic   . Hypertension   . Angina   . Pacemaker   . CHF (congestive heart failure)   . Shortness of breath   . Headache   . H/O hiatal hernia   . Neuromuscular disorder     tremors  . Anemia     Medications:  Medication Sig  . ALPRAZolam (XANAX) 0.5 MG tablet Take 0.25-0.5 mg by mouth daily as needed. For anxiety  . amiodarone (PACERONE) 100 MG tablet Take 100 mg by mouth every morning. Decrease in dose  . aspirin 325 MG tablet Take 325 mg by mouth daily.     . B Complex Vitamins (B COMPLEX-B12 PO) Take 1 tablet by mouth every morning.   . clopidogrel (PLAVIX) 75 MG tablet Take 75 mg by mouth every morning.  Marland Kitchen esomeprazole (NEXIUM) 40 MG capsule Take 40 mg by mouth 2 (two) times daily.   . ferrous sulfate 325 (65 FE) MG tablet Take 325 mg by mouth at bedtime. IRON SUPPLEMENT FOR ANEMIA.  . folic acid (FOLVITE) 1 MG tablet Take 1 mg by mouth every morning.  . furosemide (LASIX) 40 MG tablet Take 40 mg by mouth as directed. Take 1 tablet am and 1/2 pm   . isosorbide mononitrate (IMDUR) 30 MG 24 hr tablet Take 30 mg by mouth at bedtime.  . metoprolol (TOPROL-XL) 50 MG 24 hr tablet Take 50 mg by mouth every morning.    . nitroGLYCERIN (NITROSTAT) 0.4 MG SL tablet Place 0.4 mg under the tongue. Take as directed for chest pain   . potassium chloride SA (K-DUR,KLOR-CON) 20 MEQ tablet Take 20 mEq by mouth 2 (two) times daily.  . primidone (MYSOLINE) 50 MG tablet Take 100 mg by mouth 2 (two) times daily.   . rosuvastatin (CRESTOR) 20 MG tablet Take 20 mg by mouth at bedtime.   Marland Kitchen  ondansetron (ZOFRAN) 4 MG tablet Take 4 mg by mouth. As needed for nausea   . vitamin E 400 UNIT capsule Take 400 Units by mouth daily.      Assessment: 76 yo female with hx DES x2 presented with chest pain and still symptomatic this AM. CE neg x3, myoview neg but patient has history.  Heparin level within desired goal range of 0.3-0.7 without noted bleeding complications.  Plans for cath today.    Goal of Therapy:  Heparin level 0.3-0.7 units/ml   Plan:  1.  Continue heparin 1000 units/hour 2.  Daily heparin level/CBC   Nadara Mustard, PharmD., MS Clinical Pharmacist Pager:  [phone] 11/17/2011,2:44 PM

## 2011-11-17 NOTE — Interval H&P Note (Signed)
History and Physical Interval Note:  11/17/2011 4:40 PM  [name]  has presented today for surgery, with the diagnosis of cp  The various methods of treatment have been discussed with the patient and family. After consideration of risks, benefits and other options for treatment, the patient has consented to  Procedure(s) (LRB): LEFT HEART CATHETERIZATION WITH CORONARY ANGIOGRAM (N/A) as a surgical intervention .  The patients' history has been reviewed, patient examined, no change in status, stable for surgery.  I have reviewed the patients' chart and labs.  Questions were answered to the patient's satisfaction.     Charlton Haws

## 2011-11-17 NOTE — CV Procedure (Signed)
      Catheterization   Indication: Chest Pain previous stents to RCA  Procedure: After informed consent and clinical "time out" the right groin was prepped and draped in a sterile fashion.  A 5Fr sheath was placed in the right femoral artery using seldinger technique and local lidocaine.  Standard JL4, JR4 and angled pigtail catheters were used to engage the coronary arteries.  Coronary arteries were visualized in orthogonal views using caudal and cranial angulation.  RAO ventriculography was done using 25* cc of contrast.    Medications:   Versed: 1 mg's  Fentanyl: 0 ug's  Coronary Arteries: Right dominant with no anomalies  LM: 40% eccentric proximal  LAD: 30% prox and mid.  50-60% distal    D1: normal  D2 normal  D3: normal  D4: normal  Circumflex: 20-30% mid vessel disease   OM1: small vessel wit diffuse 70% disease in mid vessel  OM2: normal  RCA: Widely patent stents in the proximal vessel  20-30% mid and distal disease   PDA: Normal  PLA: Normal  Ventriculography: EF: 55 %, mild inferobasal hypokinesis  Hemodynamics:  Aortic Pressure: 167 87 mmHg  LV Pressure: 165 7  mmHg  Impression:  Widely patent stents in RCA.  LAD no change from previous cath and moderate disease.  OM1 is small vessel.  Initial attempts at medical Rx warrented Likely D/C in a.m.  F/U Dr. Diona Browner.    Charlton Haws 11/17/2011 5:01 PM

## 2011-11-18 DIAGNOSIS — I2510 Atherosclerotic heart disease of native coronary artery without angina pectoris: Secondary | ICD-10-CM

## 2011-11-18 LAB — BASIC METABOLIC PANEL
BUN: 11 mg/dL (ref 6–23)
Calcium: 9.6 mg/dL (ref 8.4–10.5)
Creatinine, Ser: 0.58 mg/dL (ref 0.50–1.10)
GFR calc Af Amer: >90 mL/min (ref >90)
GFR calc non Af Amer: 89 mL/min — ABNORMAL LOW (ref >90)
Glucose, Bld: 85 mg/dL (ref 70–99)

## 2011-11-18 MED ORDER — ROSUVASTATIN CALCIUM 40 MG PO TABS: 40.0000 mg | ORAL_TABLET | Freq: Every day | ORAL | Status: DC

## 2011-11-18 MED ORDER — FUROSEMIDE 40 MG PO TABS: 40.0000 mg | ORAL_TABLET | Freq: Every day | ORAL | Status: DC

## 2011-11-18 NOTE — Progress Notes (Signed)
Patient ID: [name], female   DOB: June 25, 1936, 76 y.o.   MRN: [id number]   SUBJECTIVE:  The patient is feeling well this morning. She is stable post catheterization. Her right groin is stable. She's had no recurrent chest pain or shortness of breath. She is hard of hearing. I have spoken with her very carefully. She is stable to go home from a medical viewpoint. Her husband is having some type of vascular intervention today. Her granddaughter will be here. I have spoken with the nurses about communication and trying to help arrange for the patient to get home today.   Filed Vitals:   11/17/11 2000 11/17/11 2305 11/18/11 0401 11/18/11 0500  BP: 138/79 127/73 148/75   Pulse: 61 60 61   Temp:  97.1 F (36.2 C) 97.6 F (36.4 C)   TempSrc:  Oral Oral   Resp: 18 19    Height:      Weight:    152 lb 12.5 oz (69.3 kg)  SpO2: 98% 97% 96%     Intake/Output Summary (Last 24 hours) at 11/18/11 0749 Last data filed at 11/18/11 0700  Gross per 24 hour  Intake 657.65 ml  Output   2600 ml  Net -1942.35 ml    LABS: Basic Metabolic Panel:  Basename 11/18/11 0545  NA 141  K 3.9  CL 106  CO2 25  GLUCOSE 85  BUN 11  CREATININE 0.58  CALCIUM 9.6  MG --  PHOS --   Liver Function Tests: No results found for this basename: AST:2,ALT:2,ALKPHOS:2,BILITOT:2,PROT:2,ALBUMIN:2 in the last 72 hours No results found for this basename: LIPASE:2,AMYLASE:2 in the last 72 hours CBC:  Basename 11/17/11 0520 11/16/11 0425  WBC 4.8 5.1  NEUTROABS -- --  HGB 11.7* 10.9*  HCT 36.9 34.6*  MCV 87.4 88.9  PLT 196 178   Cardiac Enzymes:  Basename 11/15/11 1223  CKTOTAL 66  CKMB 2.4  CKMBINDEX --  TROPONINI <0.30   BNP: No components found with this basename: POCBNP:3 D-Dimer: No results found for this basename: DDIMER:2 in the last 72 hours Hemoglobin A1C: No results found for this basename: HGBA1C in the last 72 hours Fasting Lipid Panel: No results found for this basename:  CHOL,HDL,LDLCALC,TRIG,CHOLHDL,LDLDIRECT in the last 72 hours Thyroid Function Tests: No results found for this basename: TSH,T4TOTAL,FREET3,T3FREE,THYROIDAB in the last 72 hours  RADIOLOGY: Dg Chest Port 1 View  11/14/2011  *RADIOLOGY REPORT*  Clinical Data: Chest pain  PORTABLE CHEST - 1 VIEW  Comparison: Chest radiograph 06/02/2011, CT 06/03/2011  Findings: Left-sided pacemaker overlies stable enlarged heart silhouette.  No effusion, infiltrate, or pneumothorax.  Mild basilar atelectasis.  IMPRESSION:  No acute findings.  Mild basilar atelectasis.  Original Report Authenticated By: Genevive Bi, M.D.    PHYSICAL EXAM   Patient is oriented to person time and place. Affect is normal. She is hard of hearing. Cardiac exam reveals S1 and S2. There no clicks or significant murmurs. There is a Band-Aid on the cath site in her right groin and it is very stable. There is no obvious hematoma.   TELEMETRY:  I have personally reviewed telemetry. There is atrial pacing.   ASSESSMENT AND PLAN:  CAD   Patient is status post cath. Her stents are patent. Her residual disease is stable. She can go home today.  Abdominal discomfort    She tells me that she's had some vague abdominal discomfort before coming into the hospital. She is feeling better in this regard. No further workup is needed  at this time.  Permanent pacemaker    The patient has a permanent pacemaker in place. Her rhythm is atrial pacing. No further workup is needed.  Patient is stable. We will be looking into ambulating her this morning and arranging for her to go home if these arrangements can be completed. The patient's husband is in the hospital at this time.   Willa Rough 11/18/2011 7:49 AM

## 2011-11-18 NOTE — Discharge Summary (Signed)
CARDIOLOGY DISCHARGE SUMMARY   Patient ID: [name] MRN: [id number] DOB/AGE: [birth year] 76 y.o.  Admit date: 11/14/2011 Discharge date: 11/18/2011  Primary Discharge Diagnosis:  Chest pain, medical therapy for coronary artery disease recommended Secondary Discharge Diagnosis:  Patient Active Problem List  Diagnoses  . HYPERLIPIDEMIA  . ANXIETY  . Essential hypertension, benign  . CORONARY ATHEROSCLEROSIS NATIVE CORONARY ARTERY  . PERICARDIAL EFFUSION  . Atrial fibrillation  . BRADYCARDIA-TACHYCARDIA SYNDROME  . GERD  . RESTING TREMOR  . Cardiac pacemaker in situ  . Chest pain  . Shortness of breath  . Anemia  . Hypokalemia   Procedures: Cardiac catheterization, coronary arteriogram, left ventriculogram  Hospital Course: [name] is a 76 year old female with a history of coronary artery disease. She had chest pain he came to the hospital where she was admitted for further evaluation and treatment.  Her cardiac enzymes were negative for MI. She was pain-free on medical therapy. She was taken to the cath lab on 11/17/2011. The results are described below. Medical therapy was recommended.  Her lipid profile showed a significant hyperlipidemia. The patient stated that she had been taking her Crestor 20 mg as prescribed so this was increased to 40 mg daily. On 11/18/2011, [name] was evaluated by Dr. Myrtis Ser. Her chest pain had resolved and her cath site showed no bruit or hematoma. [name] is ambulating without chest pain or shortness of breath and considered stable for discharge, to followup as an outpatient.  Labs:   Lab Results  Component Value Date   WBC 4.8 11/17/2011   HGB 11.7* 11/17/2011   HCT 36.9 11/17/2011   MCV 87.4 11/17/2011   PLT 196 11/17/2011    Lab 11/18/11 0545 11/15/11 0259  NA 141 --  K 3.9 --  CL 106 --  CO2 25 --  BUN 11 --  CREATININE 0.58 --  CALCIUM 9.6 --  PROT -- 6.8  BILITOT -- 0.3  ALKPHOS -- 85  ALT -- 12  AST -- 18  GLUCOSE 85  --    Basename 11/15/11 1223  CKTOTAL 66  CKMB 2.4  CKMBINDEX --  TROPONINI <0.30   Lipid Panel     Component Value Date/Time   CHOL 242* 11/15/2011 0640   TRIG 152* 11/15/2011 0640   HDL 46 11/15/2011 0640   CHOLHDL 5.3 11/15/2011 0640   VLDL 30 11/15/2011 0640   LDLCALC 166* 11/15/2011 0640    Pro B Natriuretic peptide (BNP)  Date/Time Value Range Status  11/14/2011  2:44 PM 270.1* 0-125 (pg/mL) Final  06/02/2011  7:20 PM 317.6* 0-125 (pg/mL) Final     Cardiac Cath: 11/17/2011 LM: 40% eccentric proximal  LAD: 30% prox and mid. 50-60% distal  D1: normal  D2 normal  D3: normal  D4: normal  Circumflex: 20-30% mid vessel disease  OM1: small vessel wit diffuse 70% disease in mid vessel  OM2: normal  RCA: Widely patent stents in the proximal vessel 20-30% mid and distal disease  PDA: Normal  PLA: Normal  Ventriculography: EF: 55 %, mild inferobasal hypokinesis  Radiology:  Dg Chest Port 1 View 11/14/2011  *RADIOLOGY REPORT*  Clinical Data: Chest pain  PORTABLE CHEST - 1 VIEW  Comparison: Chest radiograph 06/02/2011, CT 06/03/2011  Findings: Left-sided pacemaker overlies stable enlarged heart silhouette.  No effusion, infiltrate, or pneumothorax.  Mild basilar atelectasis.  IMPRESSION:  No acute findings.  Mild basilar atelectasis.  Original Report Authenticated By: Genevive Bi, M.D.    EKG: 16-Nov-2011 08:04:59  Atrial-paced rhythm with prolonged AV conduction T wave abnormality, consider lateral ischemia Abnormal ECG No significant change since last tracing 55mm/s 52mm/mV 100Hz  8.0.1 12SL 241 HD CID: 0 Referred by: Confirmed By: Arvilla Meres MD Vent. rate 60 BPM PR interval 244 ms QRS duration 80 ms QT/QTc 414/414 ms P-R-T axes 19 28 29   FOLLOW UP PLANS AND APPOINTMENTS Discharge Orders    Future Appointments: Provider: Department: Dept Phone: Center:   11/20/2011 10:00 AM Len Blalock, NP [facility] [phone] None   11/20/2011 11:30 AM Vickki Hearing, MD [facility] [phone] [facility]   12/17/2011 3:20 PM Jonelle Sidle, MD [facility] [phone] [id number]     Future Orders Please Complete By Expires   Diet - low sodium heart healthy      Increase activity slowly      Call MD for:  redness, tenderness, or signs of infection (pain, swelling, redness, odor or green/yellow discharge around incision site)        Allergies  Allergen Reactions  . Amitriptyline Hcl Other (See Comments)    Caused "jaws to twist and lock"  . Sulfonamide Derivatives     UNKNOWN REACTION   Medication List  As of 11/18/2011 10:12 AM   TAKE these medications         ALPRAZolam 0.5 MG tablet   Commonly known as: XANAX   Take 0.25-0.5 mg by mouth daily as needed. For anxiety      amiodarone 100 MG tablet   Commonly known as: PACERONE   Take 100 mg by mouth every morning. Decrease in dose      aspirin 325 MG tablet   Take 325 mg by mouth daily.      B COMPLEX-B12 PO   Take 1 tablet by mouth every morning.      clopidogrel 75 MG tablet   Commonly known as: PLAVIX   Take 75 mg by mouth every morning.      ferrous sulfate 325 (65 FE) MG tablet   Take 325 mg by mouth at bedtime. IRON SUPPLEMENT FOR ANEMIA.      folic acid 1 MG tablet   Commonly known as: FOLVITE   Take 1 mg by mouth every morning.      furosemide 40 MG tablet   Commonly known as: LASIX   Take 40 mg by mouth as directed. Take 1 tablet am and 1/2 pm      isosorbide mononitrate 30 MG 24 hr tablet   Commonly known as: IMDUR   Take 30 mg by mouth at bedtime.      metoprolol succinate 50 MG 24 hr tablet   Commonly known as: TOPROL-XL   Take 50 mg by mouth every morning.      NEXIUM 40 MG capsule   Generic drug: esomeprazole   Take 40 mg by mouth 2 (two) times daily.      NITROSTAT 0.4 MG SL tablet   Generic drug: nitroGLYCERIN   Place 0.4 mg under the tongue. Take as directed for chest pain      ondansetron 4 MG tablet   Commonly known as:  ZOFRAN   Take 4 mg by mouth. As needed for nausea      potassium chloride SA 20 MEQ tablet   Commonly known as: K-DUR,KLOR-CON   Take 20 mEq by mouth 2 (two) times daily.      primidone 50 MG tablet   Commonly known as: MYSOLINE   Take 100 mg by mouth 2 (  two) times daily.      rosuvastatin 40 MG tablet   Commonly known as: CRESTOR   Take 1 tablet (40 mg total) by mouth at bedtime.      vitamin E 400 UNIT capsule   Take 400 Units by mouth daily.             BRING ALL MEDICATIONS WITH YOU TO FOLLOW UP APPOINTMENTS  Time spent with patient to include physician time: 36 min Signed: Theodore Demark 11/18/2011, 10:12 AM Co-Sign MD  Patient seen and examined. I agree with the assessment and plan as detailed above. See also my additional thoughts below.   I saw the patient on rounds today. See my complete progress note from today. The patient is stable to go home. I reviewed the information with Theodore Demark PA-C  Willa Rough, MD, [facility] 11/18/2011 10:39 AM

## 2011-11-18 NOTE — Progress Notes (Signed)
DISCHARGED HOME BY WHEELCHAIR ACCOMPANIED BY GRANDDAUGHTER, STABLE, DISCHARGE INSTRUCTIONS GIVEN TO PT. BELONGINGS WITH PT.

## 2011-11-20 ENCOUNTER — Ambulatory Visit (INDEPENDENT_AMBULATORY_CARE_PROVIDER_SITE_OTHER): Payer: Medicare Other | Admitting: Internal Medicine

## 2011-11-20 ENCOUNTER — Ambulatory Visit: Payer: Medicare Other | Admitting: Orthopedic Surgery

## 2011-12-03 ENCOUNTER — Ambulatory Visit (INDEPENDENT_AMBULATORY_CARE_PROVIDER_SITE_OTHER): Payer: Medicare Other | Admitting: Internal Medicine

## 2011-12-04 ENCOUNTER — Telehealth: Payer: Self-pay | Admitting: *Deleted

## 2011-12-04 ENCOUNTER — Encounter: Payer: Self-pay | Admitting: Cardiology

## 2011-12-04 ENCOUNTER — Ambulatory Visit (INDEPENDENT_AMBULATORY_CARE_PROVIDER_SITE_OTHER): Payer: Medicare Other | Admitting: Cardiology

## 2011-12-04 ENCOUNTER — Ambulatory Visit (HOSPITAL_COMMUNITY)
Admission: RE | Admit: 2011-12-04 | Discharge: 2011-12-04 | Disposition: A | Discharge status: Home / Self Care | Payer: Medicare Other | Source: Ambulatory Visit | Attending: Cardiology | Admitting: Cardiology

## 2011-12-04 VITALS — BP 178/91 | HR 67 | Resp 24 | Ht 66.0 in | Wt 161.0 lb

## 2011-12-04 DIAGNOSIS — I4891 Unspecified atrial fibrillation: Secondary | ICD-10-CM

## 2011-12-04 DIAGNOSIS — R0609 Other forms of dyspnea: Secondary | ICD-10-CM | POA: Insufficient documentation

## 2011-12-04 DIAGNOSIS — R0602 Shortness of breath: Secondary | ICD-10-CM

## 2011-12-04 DIAGNOSIS — I2510 Atherosclerotic heart disease of native coronary artery without angina pectoris: Secondary | ICD-10-CM

## 2011-12-04 DIAGNOSIS — R0989 Other specified symptoms and signs involving the circulatory and respiratory systems: Secondary | ICD-10-CM | POA: Insufficient documentation

## 2011-12-04 DIAGNOSIS — I495 Sick sinus syndrome: Secondary | ICD-10-CM

## 2011-12-04 DIAGNOSIS — I059 Rheumatic mitral valve disease, unspecified: Secondary | ICD-10-CM

## 2011-12-04 DIAGNOSIS — I10 Essential (primary) hypertension: Secondary | ICD-10-CM | POA: Insufficient documentation

## 2011-12-04 MED ORDER — FUROSEMIDE 40 MG PO TABS: ORAL_TABLET | ORAL | Status: DC

## 2011-12-04 NOTE — Patient Instructions (Signed)
**Note De-Identified  Obfuscation** A chest x-ray takes a picture of the organs and structures inside the chest, including the heart, lungs, and blood vessels. This test can show several things, including, whether the heart is enlarges; whether fluid is building up in the lungs; and whether pacemaker / defibrillator leads are still in place.  Your physician has requested that you have an echocardiogram. Echocardiography is a painless test that uses sound waves to create images of your heart. It provides your doctor with information about the size and shape of your heart and how well your heart's chambers and valves are working. This procedure takes approximately one hour. There are no restrictions for this procedure.  Your physician recommends that you return for lab work in: 1 week (on May 6)  Your physician has recommended you make the following change in your medication: increase Furosemide to 80 mg (2 tablets) in the mornings and 40 mg (1 tablet) in the afternoon  Your physician recommends that you schedule a follow-up appointment in: 1 week

## 2011-12-04 NOTE — Assessment & Plan Note (Signed)
Her rhythm was been stable on Amiodarone. She is not anticoagulated with history of hemorrhagic pericardial effusion. 

## 2011-12-04 NOTE — Progress Notes (Signed)
*  PRELIMINARY RESULTS* Echocardiogram 2D Echocardiogram has been performed.  Conrad [place] 12/04/2011, 2:03 PM

## 2011-12-04 NOTE — Assessment & Plan Note (Signed)
Stable at recent catheterization and to be managed medically at this point.

## 2011-12-04 NOTE — Progress Notes (Signed)
Clinical Summary [name] is a medically complex 76 y.o.female referred to the office today by [name] with concerns about progressive shortness of breath. I last saw her in February of this year at which time she was relatively stable with no significant medication adjustments made. I note she was recently admitted to [facility] in mid April with chest pain associated with nausea, on baseline shortness of breath. She ruled out for myocardial infarction and was otherwise stable with obvious dysrhythmia. Cardiac catheterization was pursued revealing stable, moderate CAD with patent stent sites in the RCA, 50-60% distal LAD stenosis, and diffuse 70% stenosis within a small obtuse marginal. Medical therapy was recommended.  Review of recent lab work finds potassium 3.9, BUN 11, creatinine 0.5, hemoglobin 11.7, platelets 196, completely normal troponin levels, LDL 166, HDL 46, TSH 1.0, AST 18, ALT 12.  Last echocardiogram in August of 2012 revealed LVEF of 60-65% with grade 1 diastolic dysfunction, mild mitral regurgitation, mild tricuspid regurgitation, PASP 35 mm mercury, and no significant pericardial effusion.  Her weight is up approximately 9 pounds since last her hospitalization and she states that she feels full in her abdomen and more short of breath. She denies any chest pain or palpitations. States that she has been taking her medications as directed. She was on IV Lasix during her hospital stay and recalls feeling better when her weight was down around 152 pounds.  She speaks in full sentences with me in the office and her respiratory rate is 24. Oxygen saturation was 90% on room air after walking in.   Allergies  Allergen Reactions  . Amitriptyline Hcl Other (See Comments)    Caused "jaws to twist and lock"  . Sulfonamide Derivatives     UNKNOWN REACTION    Current Outpatient Prescriptions  Medication Sig Dispense Refill  . ALPRAZolam (XANAX) 0.5 MG tablet Take 0.25-0.5 mg by  mouth daily as needed. For anxiety      . amiodarone (PACERONE) 100 MG tablet Take 100 mg by mouth every morning. Decrease in dose      . aspirin 325 MG tablet Take 325 mg by mouth daily.        . B Complex Vitamins (B COMPLEX-B12 PO) Take 1 tablet by mouth every morning.       . clopidogrel (PLAVIX) 75 MG tablet Take 75 mg by mouth every morning.      Marland Kitchen esomeprazole (NEXIUM) 40 MG capsule Take 40 mg by mouth 2 (two) times daily.       . ferrous sulfate 325 (65 FE) MG tablet Take 325 mg by mouth at bedtime. IRON SUPPLEMENT FOR ANEMIA.      . folic acid (FOLVITE) 1 MG tablet Take 1 mg by mouth every morning.      . furosemide (LASIX) 40 MG tablet Take 40 mg by mouth as directed. Take 1 tablet am and 1/2 pm       . isosorbide mononitrate (IMDUR) 30 MG 24 hr tablet Take 30 mg by mouth at bedtime.      . metoprolol (TOPROL-XL) 50 MG 24 hr tablet Take 50 mg by mouth every morning.        . nitroGLYCERIN (NITROSTAT) 0.4 MG SL tablet Place 0.4 mg under the tongue. Take as directed for chest pain       . ondansetron (ZOFRAN) 4 MG tablet Take 4 mg by mouth. As needed for nausea       . potassium chloride SA (K-DUR,KLOR-CON) 20 MEQ tablet  Take 20 mEq by mouth 2 (two) times daily.      . primidone (MYSOLINE) 50 MG tablet Take 100 mg by mouth 2 (two) times daily.       . rosuvastatin (CRESTOR) 40 MG tablet Take 1 tablet (40 mg total) by mouth at bedtime.  30 tablet  11  . vitamin E 400 UNIT capsule Take 400 Units by mouth daily.          Past Medical History  Diagnosis Date  . Atrial fibrillation   . Coronary atherosclerosis of native coronary artery     DES x 2 to RCA 10/10  . GERD (gastroesophageal reflux disease)   . Tachycardia-bradycardia syndrome     s/p Medtronic Adapta L dual chamber device  5/10  . Hyperlipidemia   . COPD (chronic obstructive pulmonary disease)   . Anxiety   . Resting tremor   . 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 Chronic back pain   . Pericardial  effusion     Hemorrhagic   . Essential hypertension, benign   . Headache   . H/O hiatal hernia   . Neuromuscular disorder     Tremors    Social History [name] reports that she has never smoked. She does not have any smokeless tobacco history on file. [name] reports that she does not drink alcohol.  Review of Systems No syncope. No reported bleeding problems. Appetite fair. Otherwise negative.  Physical Examination Filed Vitals:   12/04/11 1258  BP: 178/91  Pulse: 67  Resp: 24   Chronically ill-appearing woman in no acute distress.  HEENT: Conjunctiva and lids are normal, oropharynx clear.  Neck: Supple, difficult to asses JV pulsations, no bruits.  Lungs: Clear to auscultation with diminished breath sounds mainly at bases. Cardiac: Regular rate and rhythm without S3 gallop. No pericardial rub or knock.  Abdomen: Protuberant but soft, nontender, bowel sounds present  Extremities: 1+ edema.  Skin: Warm and dry.  Musculoskeletal: Kyphosis noted.  Neuropsychiatric: Alert and oriented x3. Resting tremor.     Problem List and Plan

## 2011-12-04 NOTE — Telephone Encounter (Addendum)
Message copied by Harriett Sine on Thu Dec 04, 2011  3:52 PM ------      Message from: MCDOWELL, Illene Bolus      Created: Thu Dec 04, 2011  2:58 PM       Clear lung fields, no effusion. Please let her know. Pt notified that CXR was normal. CTUCK

## 2011-12-04 NOTE — Assessment & Plan Note (Addendum)
Chronic history, reportedly worse over the last several weeks. Recent cardiac catheterization showed stable moderate CAD with patent stent sites. Right heart pressures not obtained. She states that she felt better with IV diuresis and weight down at 152 pounds, and her weight has increased since discharge. We discussed the situation and will plan an echocardiogram today to exclude pericardial effusion (doubt after window), also assess LVEF and valvular status. She might have constrictive physiology with could be contributing and hopefully this can be assessed to some degree by echocardiogram - may need further testing. Also obtain CXR - not obtained at recent hospital stay. Lasix is being increased temporarily to 80 mg AM and 40 mg afternoon. She will have followup in a week with BMET.

## 2011-12-04 NOTE — Assessment & Plan Note (Signed)
Status post pacemaker placement. ?

## 2011-12-08 ENCOUNTER — Other Ambulatory Visit: Payer: Self-pay | Admitting: Cardiology

## 2011-12-08 LAB — BASIC METABOLIC PANEL
Calcium: 9.9 mg/dL (ref 8.4–10.5)
Glucose, Bld: 82 mg/dL (ref 70–99)
Sodium: 139 mEq/L (ref 135–145)

## 2011-12-09 ENCOUNTER — Ambulatory Visit: Payer: Medicare Other | Admitting: Orthopedic Surgery

## 2011-12-11 ENCOUNTER — Encounter: Payer: Self-pay | Admitting: Cardiology

## 2011-12-11 ENCOUNTER — Ambulatory Visit (INDEPENDENT_AMBULATORY_CARE_PROVIDER_SITE_OTHER): Payer: Medicare Other | Admitting: Cardiology

## 2011-12-11 ENCOUNTER — Ambulatory Visit (INDEPENDENT_AMBULATORY_CARE_PROVIDER_SITE_OTHER): Payer: Medicare Other | Admitting: Internal Medicine

## 2011-12-11 VITALS — BP 147/86 | HR 75 | Ht 65.0 in | Wt 158.0 lb

## 2011-12-11 DIAGNOSIS — R0602 Shortness of breath: Secondary | ICD-10-CM

## 2011-12-11 DIAGNOSIS — I2510 Atherosclerotic heart disease of native coronary artery without angina pectoris: Secondary | ICD-10-CM

## 2011-12-11 DIAGNOSIS — I10 Essential (primary) hypertension: Secondary | ICD-10-CM

## 2011-12-11 NOTE — Assessment & Plan Note (Signed)
She is not reporting any active angina at this time, and continues on medical therapy including DAPT.

## 2011-12-11 NOTE — Progress Notes (Signed)
Clinical Summary Ms. Knoop is a 76 y.o.female presenting for followup. She was just recently seen last week. I referred her for followup testing in light of shortness of breath, also intensified her diuretic regimen.   Followup echocardiogram showed normal LV function as before with LVEF of 60-65%, no septal bounce, no wall motion abnormalities. Diastolic dysfunction was noted. There was no pericardial effusion, and no major change in mild to moderate mitral regurgitation and mild tricuspid regurgitation.Marland KitchenPASP was 43 mm mercury.  Chest x-ray showed clear lung fields with no pleural effusion. Followup lab work shows potassium 3.7, BUN 12, creatinine 0.8. We discussed these results today.  She reports no major change in her symptoms, perhaps somewhat better. Still feels as if she is bloated. Her weight is actually down 3 pounds from last visit. She reports no chest pain or palpitations, states that she has been compliant with her medications. She does not have a scale at home.   Allergies  Allergen Reactions  . Amitriptyline Hcl Other (See Comments)    Caused "jaws to twist and lock"  . Sulfonamide Derivatives     UNKNOWN REACTION    Current Outpatient Prescriptions  Medication Sig Dispense Refill  . ALPRAZolam (XANAX) 0.5 MG tablet Take 0.25-0.5 mg by mouth daily as needed. For anxiety      . amiodarone (PACERONE) 100 MG tablet Take 100 mg by mouth every morning. Decrease in dose      . aspirin 325 MG tablet Take 325 mg by mouth daily.        . B Complex Vitamins (B COMPLEX-B12 PO) Take 1 tablet by mouth every morning.       . clopidogrel (PLAVIX) 75 MG tablet Take 75 mg by mouth every morning.      Marland Kitchen esomeprazole (NEXIUM) 40 MG capsule Take 40 mg by mouth 2 (two) times daily.       . ferrous sulfate 325 (65 FE) MG tablet Take 325 mg by mouth at bedtime. IRON SUPPLEMENT FOR ANEMIA.      . folic acid (FOLVITE) 1 MG tablet Take 1 mg by mouth every morning.      . furosemide (LASIX) 40 MG  tablet Take 80 mg in am and 40 mg in afternoon  90 tablet  3  . isosorbide mononitrate (IMDUR) 30 MG 24 hr tablet Take 30 mg by mouth at bedtime.      . metoprolol (TOPROL-XL) 50 MG 24 hr tablet Take 50 mg by mouth every morning.        . nitroGLYCERIN (NITROSTAT) 0.4 MG SL tablet Place 0.4 mg under the tongue. Take as directed for chest pain       . ondansetron (ZOFRAN) 4 MG tablet Take 4 mg by mouth. As needed for nausea       . potassium chloride SA (K-DUR,KLOR-CON) 20 MEQ tablet Take 20 mEq by mouth 2 (two) times daily.      . primidone (MYSOLINE) 50 MG tablet Take 100 mg by mouth 2 (two) times daily.       . rosuvastatin (CRESTOR) 40 MG tablet Take 1 tablet (40 mg total) by mouth at bedtime.  30 tablet  11  . vitamin E 400 UNIT capsule Take 400 Units by mouth daily.          Past Medical History  Diagnosis Date  . Atrial fibrillation   . Coronary atherosclerosis of native coronary artery     DES x 2 to RCA 10/10  . GERD (gastroesophageal  reflux disease)   . Tachycardia-bradycardia syndrome     s/p Medtronic Adapta L dual chamber device  5/10  . Hyperlipidemia   . COPD (chronic obstructive pulmonary disease)   . Anxiety   . Resting tremor   . 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 Chronic back pain   . Pericardial effusion     Hemorrhagic   . Essential hypertension, benign   . Headache   . H/O hiatal hernia   . Neuromuscular disorder     Tremors    Social History Ms. Murley reports that she has never smoked. She does not have any smokeless tobacco history on file. Ms. Jahr reports that she does not drink alcohol.  Review of Systems Otherwise negative except as outlined previously.  Physical Examination Filed Vitals:   12/11/11 1454  BP: 147/86  Pulse: 75    Chronically ill-appearing woman in no acute distress.  HEENT: Conjunctiva and lids are normal, oropharynx clear.  Neck: Supple, difficult to asses JV pulsations, no bruits.  Lungs: Clear  to auscultation with diminished breath sounds mainly at bases.  Cardiac: Regular rate and rhythm without S3 gallop. No pericardial rub or knock.  Abdomen: Protuberant but soft, nontender, bowel sounds present  Extremities: Trace edema.     Problem List and Plan   Shortness of breath Plan at this point is to continue higher dose diuretic aiming to achieve previous weight around 152 pounds where she reportedly felt better. I have asked her to purchase a scale and weigh herself daily. Follow up is being scheduled over the next few weeks with a BMET. Recent echocardiogram and chest x-ray overall reassuring. Still not entirely convinced that this represents restrictive or constrictive physiology, although remains a consideration.  CORONARY ATHEROSCLEROSIS NATIVE CORONARY ARTERY She is not reporting any active angina at this time, and continues on medical therapy including DAPT.     Jonelle Sidle, M.D., F.A.C.C.

## 2011-12-11 NOTE — Patient Instructions (Addendum)
Your physician recommends that you schedule a follow-up appointment in: 2 weeks  Continue lasix 80 mg in the am and 40 mg in the pm  Your physician recommends that you return for lab work in: In 2 weeks, prior to your next appointment

## 2011-12-11 NOTE — Assessment & Plan Note (Signed)
Plan at this point is to continue higher dose diuretic aiming to achieve previous weight around 152 pounds where she reportedly felt better. I have asked her to purchase a scale and weigh herself daily. Follow up is being scheduled over the next few weeks with a BMET. Recent echocardiogram and chest x-ray overall reassuring. Still not entirely convinced that this represents restrictive or constrictive physiology, although remains a consideration.

## 2011-12-12 ENCOUNTER — Ambulatory Visit
Admission: RE | Admit: 2011-12-12 | Discharge: 2011-12-12 | Disposition: A | Discharge status: Home / Self Care | Payer: Medicare Other | Source: Ambulatory Visit | Attending: Neurology | Admitting: Neurology

## 2011-12-12 DIAGNOSIS — R51 Headache: Secondary | ICD-10-CM

## 2011-12-17 ENCOUNTER — Encounter: Payer: Medicare Other | Admitting: Cardiology

## 2011-12-24 LAB — BASIC METABOLIC PANEL
BUN: 13 mg/dL (ref 6–23)
Calcium: 9.7 mg/dL (ref 8.4–10.5)
Creat: 0.77 mg/dL (ref 0.50–1.10)
Glucose, Bld: 78 mg/dL (ref 70–99)

## 2011-12-26 ENCOUNTER — Ambulatory Visit (INDEPENDENT_AMBULATORY_CARE_PROVIDER_SITE_OTHER): Payer: Medicare Other | Admitting: Cardiology

## 2011-12-26 ENCOUNTER — Ambulatory Visit: Payer: Medicare Other | Admitting: Cardiology

## 2011-12-26 ENCOUNTER — Encounter: Payer: Self-pay | Admitting: Cardiology

## 2011-12-26 VITALS — BP 159/89 | HR 68 | Resp 16 | Ht 65.0 in | Wt 155.0 lb

## 2011-12-26 DIAGNOSIS — R0602 Shortness of breath: Secondary | ICD-10-CM

## 2011-12-26 DIAGNOSIS — I10 Essential (primary) hypertension: Secondary | ICD-10-CM

## 2011-12-26 NOTE — Progress Notes (Signed)
Clinical Summary Ms. [name] is a 76 y.o.female presenting for followup. I just saw her recently in May. We have been managing her medically, aiming mainly at gentle diuresis with Lasix. Her weight has come down 3 pounds from last visit. She states that she feels somewhat better.  Recent followup lab work shows potassium 4.1, BUN 13, creatinine 0.7. Overall stable.  Still no angina symptoms, no palpitations.   Allergies  Allergen Reactions  . Amitriptyline Hcl Other (See Comments)    Caused "jaws to twist and lock"  . Sulfonamide Derivatives     UNKNOWN REACTION    Current Outpatient Prescriptions  Medication Sig Dispense Refill  . ALPRAZolam (XANAX) 0.5 MG tablet Take 0.25-0.5 mg by mouth daily as needed. For anxiety      . amiodarone (PACERONE) 100 MG tablet Take 100 mg by mouth every morning. Decrease in dose      . aspirin 325 MG tablet Take 325 mg by mouth daily.        . B Complex Vitamins (B COMPLEX-B12 PO) Take 1 tablet by mouth every morning.       . clopidogrel (PLAVIX) 75 MG tablet Take 75 mg by mouth every morning.      Marland Kitchen esomeprazole (NEXIUM) 40 MG capsule Take 40 mg by mouth 2 (two) times daily.       . ferrous sulfate 325 (65 FE) MG tablet Take 325 mg by mouth at bedtime. IRON SUPPLEMENT FOR ANEMIA.      . folic acid (FOLVITE) 1 MG tablet Take 1 mg by mouth every morning.      . furosemide (LASIX) 40 MG tablet Take 80 mg in am and 40 mg in afternoon  90 tablet  3  . isosorbide mononitrate (IMDUR) 30 MG 24 hr tablet Take 30 mg by mouth at bedtime.      . metoprolol (TOPROL-XL) 50 MG 24 hr tablet Take 50 mg by mouth every morning.        . nitroGLYCERIN (NITROSTAT) 0.4 MG SL tablet Place 0.4 mg under the tongue. Take as directed for chest pain       . ondansetron (ZOFRAN) 4 MG tablet Take 4 mg by mouth. As needed for nausea       . potassium chloride SA (K-DUR,KLOR-CON) 20 MEQ tablet Take 20 mEq by mouth 2 (two) times daily.      . primidone (MYSOLINE) 50 MG tablet  Take 100 mg by mouth 2 (two) times daily.       . rosuvastatin (CRESTOR) 40 MG tablet Take 1 tablet (40 mg total) by mouth at bedtime.  30 tablet  11  . vitamin E 400 UNIT capsule Take 400 Units by mouth daily.          Past Medical History  Diagnosis Date  . Atrial fibrillation   . Coronary atherosclerosis of native coronary artery     DES x 2 to RCA 10/10  . GERD (gastroesophageal reflux disease)   . Tachycardia-bradycardia syndrome     s/p Medtronic Adapta L dual chamber device  5/10  . Hyperlipidemia   . COPD (chronic obstructive pulmonary disease)   . Anxiety   . Resting tremor   . 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 Chronic back pain   . Pericardial effusion     Hemorrhagic   . Essential hypertension, benign   . Headache   . H/O hiatal hernia   . Neuromuscular disorder  Tremors    Social History [name] reports that she has never smoked. She does not have any smokeless tobacco history on file. [name] reports that she does not drink alcohol.  Review of Systems Tremors as before, stable appetite, no reported bleeding problems, no cough, no fevers or chills. Otherwise negative.  Physical Examination Filed Vitals:   12/26/11 1444  BP: 159/89  Pulse: 68  Resp: 16    Chronically ill-appearing woman in no acute distress.  HEENT: Conjunctiva and lids are normal, oropharynx clear.  Neck: Supple, difficult to asses JV pulsations, no bruits.  Lungs: Clear to auscultation with diminished breath sounds mainly at bases.  Cardiac: Regular rate and rhythm without S3 gallop. No pericardial rub or knock.  Abdomen: Soft, nontender, bowel sounds present  Extremities: Trace edema.    Problem List and Plan   Shortness of breath Continue present dose diuretic aiming to achieve previous weight around 152 pounds where she reportedly felt better. BMET has been stable. Recent echocardiogram and chest x-ray overall reassuring. Follow up arranged with  lab work. Eventually anticipate cutting Lasix dose back somewhat.      Jonelle Sidle, M.D., F.A.C.C.

## 2011-12-26 NOTE — Assessment & Plan Note (Signed)
Continue present dose diuretic aiming to achieve previous weight around 152 pounds where she reportedly felt better. BMET has been stable. Recent echocardiogram and chest x-ray overall reassuring. Follow up arranged with lab work. Eventually anticipate cutting Lasix dose back somewhat.

## 2011-12-26 NOTE — Patient Instructions (Signed)
**Note De-Identified  Obfuscation** Your physician recommends that you return for lab work in: 1 to 2 days before next office visit in 3 weeks  Your physician recommends that you schedule a follow-up appointment in: 3 weeks

## 2012-01-01 ENCOUNTER — Telehealth: Payer: Self-pay | Admitting: Internal Medicine

## 2012-01-01 ENCOUNTER — Encounter: Payer: Self-pay | Admitting: Internal Medicine

## 2012-01-01 NOTE — Telephone Encounter (Signed)
01-01-12 called pt n/a, unable to leave message, sent past due letter/mt

## 2012-01-07 ENCOUNTER — Other Ambulatory Visit: Payer: Self-pay | Admitting: Cardiology

## 2012-01-07 LAB — BASIC METABOLIC PANEL
BUN: 10 mg/dL (ref 6–23)
Calcium: 9.7 mg/dL (ref 8.4–10.5)
Creat: 0.68 mg/dL (ref 0.50–1.10)
Glucose, Bld: 80 mg/dL (ref 70–99)
Sodium: 140 mEq/L (ref 135–145)

## 2012-01-08 ENCOUNTER — Ambulatory Visit (INDEPENDENT_AMBULATORY_CARE_PROVIDER_SITE_OTHER): Payer: Medicare Other | Admitting: Cardiology

## 2012-01-08 ENCOUNTER — Encounter: Payer: Self-pay | Admitting: Cardiology

## 2012-01-08 VITALS — BP 169/95 | HR 91 | Resp 16 | Ht 65.0 in | Wt 161.0 lb

## 2012-01-08 DIAGNOSIS — R0602 Shortness of breath: Secondary | ICD-10-CM

## 2012-01-08 DIAGNOSIS — I10 Essential (primary) hypertension: Secondary | ICD-10-CM

## 2012-01-08 DIAGNOSIS — Z950 Presence of cardiac pacemaker: Secondary | ICD-10-CM

## 2012-01-08 NOTE — Progress Notes (Signed)
Clinical Summary [name] is a 76 y.o.female presenting for followup. She was seen in late May. We continue close followup with her symptoms of shortness of breath and feeling of abdominal bloating. She has had no major improvement since last visit. She has been concerned about her Lasix, and in speaking with her, it seems that she has modified or skipped doses at times. Scales indicate weight up 6 pounds from last visit.   We discussed diet and sodium restriction, also plan to continue on the current diuretic regimen. Renal function has remained stable as has potassium on this regimen.  We reviewed her cardiac catheterization results, also echocardiogram.  Allergies  Allergen Reactions  . Amitriptyline Hcl Other (See Comments)    Caused "jaws to twist and lock"  . Sulfonamide Derivatives     UNKNOWN REACTION    Current Outpatient Prescriptions  Medication Sig Dispense Refill  . ALPRAZolam (XANAX) 0.5 MG tablet Take 0.25-0.5 mg by mouth daily as needed. For anxiety      . amiodarone (PACERONE) 100 MG tablet Take 100 mg by mouth every morning. Decrease in dose       . aspirin 325 MG tablet Take 325 mg by mouth daily.        . B Complex Vitamins (B COMPLEX-B12 PO) Take 1 tablet by mouth every morning.       . clopidogrel (PLAVIX) 75 MG tablet Take 75 mg by mouth every morning.      Marland Kitchen esomeprazole (NEXIUM) 40 MG capsule Take 40 mg by mouth 2 (two) times daily.       . ferrous sulfate 325 (65 FE) MG tablet Take 325 mg by mouth at bedtime. IRON SUPPLEMENT FOR ANEMIA.       . folic acid (FOLVITE) 1 MG tablet Take 1 mg by mouth every morning.      . furosemide (LASIX) 40 MG tablet Take 80 mg in am and 40 mg in afternoon  90 tablet  3  . isosorbide mononitrate (IMDUR) 30 MG 24 hr tablet Take 30 mg by mouth at bedtime.      . metoprolol (TOPROL-XL) 50 MG 24 hr tablet Take 50 mg by mouth every morning.        . nitroGLYCERIN (NITROSTAT) 0.4 MG SL tablet Place 0.4 mg under the tongue. Take as  directed for chest pain       . ondansetron (ZOFRAN) 4 MG tablet Take 4 mg by mouth. As needed for nausea       . potassium chloride SA (K-DUR,KLOR-CON) 20 MEQ tablet Take 20 mEq by mouth 2 (two) times daily.      . primidone (MYSOLINE) 50 MG tablet Take 50 mg by mouth 2 (two) times daily.       . rosuvastatin (CRESTOR) 40 MG tablet Take 1 tablet (40 mg total) by mouth at bedtime.  30 tablet  11  . vitamin E 400 UNIT capsule Take 400 Units by mouth daily.          Past Medical History  Diagnosis Date  . Atrial fibrillation   . Coronary atherosclerosis of native coronary artery     DES x 2 to RCA 10/10  . GERD (gastroesophageal reflux disease)   . Tachycardia-bradycardia syndrome     s/p Medtronic Adapta L dual chamber device  5/10  . Hyperlipidemia   . COPD (chronic obstructive pulmonary disease)   . Anxiety   . Resting tremor   . [name] syndrome     With  presumed microperforation   . Chronic back pain   . Pericardial effusion     Hemorrhagic   . Essential hypertension, benign   . Headache   . H/O hiatal hernia   . Neuromuscular disorder     Tremors    Social History [name] reports that she has never smoked. She does not have any smokeless tobacco history on file. [name] reports that she does not drink alcohol.  Review of Systems Reports no fevers or chills. Chronic shortness of breath. No angina or palpitations. She states she has had "eye problems," also continues with tremors. No falls. Otherwise negative.  Physical Examination Filed Vitals:   01/08/12 0839  BP: 169/95  Pulse: 91  Resp: 16   Chronically ill-appearing woman in no acute distress.  HEENT: Conjunctiva and lids are normal, oropharynx clear.  Neck: Supple, no JVD, no bruits.  Lungs: Clear to auscultation with diminished breath sounds mainly at bases.  Cardiac: Regular rate and rhythm without S3 gallop. No pericardial rub or knock.  Abdomen: Soft, nontender, bowel sounds present  Extremities:  Trace edema.     Problem List and Plan   Shortness of breath Continue the current diuretic regimen. I reinforced compliance, also diet and sodium restriction. She had been achieving a reasonable weight loss. Followup BMET for her next visit over the next few weeks. Recent cardiac catheterization and echocardiogram were reviewed, and overall stable.  Cardiac pacemaker in situ Patient needs followup in device clinic. This is being scheduled.     Jonelle Sidle, M.D., F.A.C.C.

## 2012-01-08 NOTE — Patient Instructions (Signed)
**Note De-Identified  Obfuscation** Your physician recommends that you return for lab work in: 3 weeks just before next office visit  Your physician recommends that you schedule a follow-up appointment in: 3 weeks

## 2012-01-08 NOTE — Assessment & Plan Note (Signed)
Patient needs followup in device clinic. This is being scheduled.

## 2012-01-08 NOTE — Assessment & Plan Note (Signed)
Continue the current diuretic regimen. I reinforced compliance, also diet and sodium restriction. She had been achieving a reasonable weight loss. Followup BMET for her next visit over the next few weeks. Recent cardiac catheterization and echocardiogram were reviewed, and overall stable.

## 2012-01-14 ENCOUNTER — Ambulatory Visit: Payer: Medicare Other | Admitting: Orthopedic Surgery

## 2012-01-14 ENCOUNTER — Encounter: Payer: Self-pay | Admitting: Orthopedic Surgery

## 2012-01-21 ENCOUNTER — Ambulatory Visit (INDEPENDENT_AMBULATORY_CARE_PROVIDER_SITE_OTHER): Payer: Medicare Other | Admitting: *Deleted

## 2012-01-21 ENCOUNTER — Encounter: Payer: Self-pay | Admitting: Internal Medicine

## 2012-01-21 DIAGNOSIS — I4891 Unspecified atrial fibrillation: Secondary | ICD-10-CM

## 2012-01-21 DIAGNOSIS — I495 Sick sinus syndrome: Secondary | ICD-10-CM

## 2012-01-21 LAB — PACEMAKER DEVICE OBSERVATION
AL AMPLITUDE: 0.7 mv
AL IMPEDENCE PM: 447 Ohm
AL THRESHOLD: 0.625 V
RV LEAD AMPLITUDE: 22.4 mv
RV LEAD IMPEDENCE PM: 665 Ohm

## 2012-01-21 NOTE — Progress Notes (Signed)
Pacer check in clinic  

## 2012-01-27 ENCOUNTER — Encounter (INDEPENDENT_AMBULATORY_CARE_PROVIDER_SITE_OTHER): Payer: Self-pay | Admitting: Internal Medicine

## 2012-01-27 ENCOUNTER — Other Ambulatory Visit (HOSPITAL_COMMUNITY): Payer: Self-pay | Admitting: Internal Medicine

## 2012-01-27 ENCOUNTER — Ambulatory Visit (INDEPENDENT_AMBULATORY_CARE_PROVIDER_SITE_OTHER): Payer: Medicare Other | Admitting: Internal Medicine

## 2012-01-27 VITALS — BP 130/80 | HR 72 | Temp 97.8°F | Resp 22 | Ht 65.0 in | Wt 161.6 lb

## 2012-01-27 DIAGNOSIS — R140 Abdominal distension (gaseous): Secondary | ICD-10-CM

## 2012-01-27 DIAGNOSIS — R112 Nausea with vomiting, unspecified: Secondary | ICD-10-CM

## 2012-01-27 DIAGNOSIS — R1031 Right lower quadrant pain: Secondary | ICD-10-CM | POA: Insufficient documentation

## 2012-01-27 DIAGNOSIS — R143 Flatulence: Secondary | ICD-10-CM

## 2012-01-27 DIAGNOSIS — Z139 Encounter for screening, unspecified: Secondary | ICD-10-CM

## 2012-01-27 DIAGNOSIS — R109 Unspecified abdominal pain: Secondary | ICD-10-CM

## 2012-01-27 MED ORDER — RIFAXIMIN 550 MG PO TABS: 550.0000 mg | ORAL_TABLET | Freq: Two times a day (BID) | ORAL | Status: DC

## 2012-01-27 NOTE — Patient Instructions (Addendum)
Abdominopelvic CT to be scheduled. Xifaxan 550 mg twice daily by mouth 10 days.

## 2012-01-27 NOTE — Progress Notes (Signed)
Presenting complaint;  Abdominal pain.  Subjective:  [name] 76 year old Caucasian female who presents with upper and lower abdominal pain. She was last seen for dysphagia back in November 2012. She remains her dysphagia. Prior workup has revealed esophageal motility disorder to be the cause of her dysphagia. She has had abdominal pain off and on for years but it has gotten worse over the last few months. She experiences pain across upper abdomen after meals associated with frequent nausea and sporadic vomiting. At times pain is intense but does not last long. This pain does not radiate posteriorly. She also complains of pain in right lower quadrant. She describes this pain to be a different type of pain but not as severe. She remains with irregular bowel movements. She has diarrhea and/or constipation. Since her last visit she may have had one or 2 episodes of hematochezia. Patient's last colonoscopy was in October 2011 revealing external hemorrhoids. She denies frank bleeding or melena. Her weight is down by 2 pounds since her last visit. She states she is hungry all the time but her symptoms prevent her from eating a regular meal. She also developed abdominal bloating and swelling every time she eats. She states she is under a lot of stress as she is caring for great-granddaughter age 38.  Current Medications: Current Outpatient Prescriptions  Medication Sig Dispense Refill  . ALPRAZolam (XANAX) 0.5 MG tablet Take 0.25-0.5 mg by mouth daily as needed. For anxiety      . amiodarone (PACERONE) 100 MG tablet Take 100 mg by mouth every morning. Decrease in dose       . aspirin 325 MG tablet Take 325 mg by mouth daily.        . B Complex Vitamins (B COMPLEX-B12 PO) Take 1 tablet by mouth every morning.       . clopidogrel (PLAVIX) 75 MG tablet Take 75 mg by mouth every morning.      Marland Kitchen esomeprazole (NEXIUM) 40 MG capsule Take 40 mg by mouth 2 (two) times daily.       . folic acid (FOLVITE) 1 MG tablet  Take 1 mg by mouth every morning.      . furosemide (LASIX) 40 MG tablet Take 80 mg in am and 40 mg in afternoon  90 tablet  3  . isosorbide mononitrate (IMDUR) 30 MG 24 hr tablet Take 30 mg by mouth at bedtime.      . nitroGLYCERIN (NITROSTAT) 0.4 MG SL tablet Place 0.4 mg under the tongue. Take as directed for chest pain       . ondansetron (ZOFRAN) 4 MG tablet Take 4 mg by mouth. As needed for nausea       . potassium chloride SA (K-DUR,KLOR-CON) 20 MEQ tablet Take 20 mEq by mouth 2 (two) times daily.      . primidone (MYSOLINE) 50 MG tablet Take 50 mg by mouth 2 (two) times daily.       . rosuvastatin (CRESTOR) 40 MG tablet Take 1 tablet (40 mg total) by mouth at bedtime.  30 tablet  11  . vitamin E 400 UNIT capsule Take 400 Units by mouth daily.        . ferrous sulfate 325 (65 FE) MG tablet Take 325 mg by mouth at bedtime. IRON SUPPLEMENT FOR ANEMIA.       . metoprolol (TOPROL-XL) 50 MG 24 hr tablet Take 50 mg by mouth every morning.         PMH; Chronic GERD. Last EGD was  in August 2010 revealing small sliding hiatal hernia. She has been treated for H. pylori gastritis in the past Esophageal motility disorder. History of IBS. Anxiety neurosis. Chronic tremors. Chronic low back pain secondary to DJD. Status post cholecystectomy. Status post hysterectomy. Permanent pacemaker placed in May 2010. Coronary artery disease. Single vessel stented in October 2010. Pericardial window for effusion in November 2010. Last  colonoscopy was in October 2011   Objective: Blood pressure 130/80, pulse 72, temperature 97.8 F (36.6 C), temperature source Oral, resp. rate 22, height 5\' 5"  (1.651 m), weight 161 lb 9.6 oz (73.301 kg). Patient appears quite anxious with tremors to both hands but no asterixis noted. Conjunctiva is pink. Sclera is nonicteric Oropharyngeal mucosa is normal. No neck masses or thyromegaly noted. Cardiac exam with regular rhythm normal S1 and S2. No murmur or gallop  noted. Lungs are clear to auscultation. Abdomen. Bowel sounds are normal. No bruits noted. On palpation soft abdomen mild tenderness across upper abdomen and right lower quadrant. No guarding noted. No organomegaly or masses. No LE edema or clubbing noted.    Assessment:  #1. Abdominal pain involving upper and right lower quadrant associated with frequent nausea sporadic vomiting. He also has irregular bowel movements. Suspect symptoms may be secondary to dyspepsia and IBS. Nothing significant was found on EGD of August 2010 and colonoscopy of October 2011. Need to make sure she does not have pancreatic neoplasm or choledocholithiasis.   Plan:  Empiric therapy with Xifaxan 550 mg by mouth twice a day for 10 days. Abdominopelvic CT with contrast. Office visit in one month.

## 2012-01-28 ENCOUNTER — Ambulatory Visit (INDEPENDENT_AMBULATORY_CARE_PROVIDER_SITE_OTHER): Payer: Medicare Other | Admitting: Orthopedic Surgery

## 2012-01-28 ENCOUNTER — Encounter: Payer: Self-pay | Admitting: Orthopedic Surgery

## 2012-01-28 VITALS — BP 130/80 | Ht 65.0 in | Wt 161.0 lb

## 2012-01-28 DIAGNOSIS — M4800 Spinal stenosis, site unspecified: Secondary | ICD-10-CM | POA: Insufficient documentation

## 2012-01-28 MED ORDER — HYDROCODONE-ACETAMINOPHEN 5-325 MG PO TABS: 1.0000 | ORAL_TABLET | Freq: Four times a day (QID) | ORAL | Status: AC | PRN

## 2012-01-28 NOTE — Patient Instructions (Addendum)
Start therapySpinal Stenosis One cause of back pain is spinal stenosis. Stenosis means abnormal narrowing. The spinal canal contains and protects the spinal nerve roots. In spinal stenosis, the spinal canal narrows and pinches the spinal cord and nerves. This causes low back pain and pain in the legs. Stenosis may pinch the nerves that control muscles and sensation in the legs. This leads to pain and abnormal feelings in the leg muscles and areas supplied by those nerves. CAUSES   Spinal stenosis often happens to people as they get older and arthritic boney growths occur in their spinal canal. There is also a loss of the disk height between the bones of the back, which also adds to this problem. Sometimes the problem is present at birth. SYMPTOMS    Pain that is generally worse with activities, particularly standing and walking.   Numbness, tingling, hot or cold feelings, weakness, or a weariness in the legs.   Clumsiness, frequent falling, and a foot-slapping gait, which may come as a result of nerve pressure and muscle weakness.  DIAGNOSIS    Your caregiver may suspect spinal stenosis if you have unusual leg symptoms, such as those previously mentioned.   Your orthopedic surgeon may request special imaging exams, such a computerized magnetic scan (MRI) or computerized X-ray scan (CT) to find out the cause of the problem.  TREATMENT    Sometimes treatments such as postural changes or nonsteroidal anti-inflammatory drugs will relieve the pain.   Nonsteroidal anti-inflammatory medications may help relieve symptoms. These medicines do this by decreasing swelling and inflammation in the nerves.   When stenosis causes severe nerve root compression, conservative treatment may not be enough to maintain a normal lifestyle. Surgery may be recommended to relieve the pressure on affected nerves. In properly selected patients, the results are very good, and patients are able to continue a normal  lifestyle.  HOME CARE INSTRUCTIONS    Flexing the spine by leaning forward while walking may relieve symptoms. Lying with the knees drawn up to the chest may offer some relief. These positions enlarge the space available to the nerves. They may make it easier for stenosis sufferers to walk longer distances.   Rest, followed by gradually resuming activity, also can help.   Aerobic activity, such as bicycling or swimming, is often recommended.   Losing weight can also relieve some of the load on the spine.   Application of warm or cold compresses to the area of pain can be helpful.  SEEK MEDICAL CARE IF:    The periods of relief between episodes of pain become shorter and shorter.   You experience pain that radiates down your leg, even when you are not standing or walking.  SEEK IMMEDIATE MEDICAL CARE IF:    You have a loss of bowel or bladder control.   You have a sudden loss of feeling in your legs.   You suddenly cannot move your legs.  Document Released: 10/11/2003 Document Revised: 07/10/2011 Document Reviewed: 12/06/2009 [facility] Patient Information 2012 [place], [place].

## 2012-01-28 NOTE — Progress Notes (Signed)
Patient ID: [name], female   DOB: [birth year], 76 y.o.   MRN: [id number] Chief Complaint  Patient presents with  . Hip Pain    left hip pain since March 2013, gradual onset, no known injury    BP 130/80  Ht 5\' 5"  (1.651 m)  Wt 161 lb (73.029 kg)  BMI 26.79 kg/m2  Chief complaint is pain in the left hip but the patient is asked complaining of pain in her lower back and radiating down both legs  The pain started in March of 2013. It came on gradually its intensity as 5/10 it seems to come and go it's worse when she's standing is better when she's sitting with a flexed spine. She has difficulties with her activities of daily living such as washing dishes or standing trying to cook. No previous treatment. She did have a history of a PSI injection many many are still did well with one shot pain hasn't come back until now  She does report some weight gain blurred vision shortness of breath wheezing cough pain on inspiration intermittent chest pain frequency secondary to Lasix joint pain swelling tremors nervousness easy bleeding and bruising excessive thirst and excessive urination  Past Medical History  Diagnosis Date  . Atrial fibrillation   . Coronary atherosclerosis of native coronary artery     DES x 2 to RCA 10/10  . GERD (gastroesophageal reflux disease)   . Tachycardia-bradycardia syndrome     s/p Medtronic Adapta L dual chamber device  5/10  . Hyperlipidemia   . COPD (chronic obstructive pulmonary disease)   . Anxiety   . Resting tremor   . 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 Chronic back pain   . Pericardial effusion     Hemorrhagic   . Essential hypertension, benign   . Headache   . H/O hiatal hernia   . Neuromuscular disorder     Tremors    Past Surgical History  Procedure Date  . Cholecystectomy   . Appendectomy   . Subxiphoid pericardial window 11/10  . Esophagogastroduodenoscopy with esophageal dilation 2004, 2006, 2007  . Vaginal  hysterectomy   . Colonoscopy 2011  . Right rotator cuff repair   . Insert / replace / remove pacemaker     Exam reveals a fairly well-developed well-nourished female grooming and hygiene are intact she is oriented x3 her mood is excellent she has an abnormal gait and station with a flexed posture and a limp.  Her hips have no tenderness full range of motion no instability and normal strength she has negative straight leg raises her reflexes are normal her skin is intact pulses are good no abnormality to sensation detected  Her hip x-ray was normal  Impression spinal stenosis  Recommend pain medication with hydrocodone and physical therapy if she doesn't improve then we should do some back x-rays and MRI and an epidural injection she has not been a be a surgical candidate and will need chronic pain management from here on out.

## 2012-01-29 ENCOUNTER — Ambulatory Visit (HOSPITAL_COMMUNITY): Payer: Medicare Other

## 2012-02-02 ENCOUNTER — Ambulatory Visit (INDEPENDENT_AMBULATORY_CARE_PROVIDER_SITE_OTHER): Payer: Medicare Other | Admitting: Adult Health

## 2012-02-02 ENCOUNTER — Encounter: Payer: Self-pay | Admitting: Adult Health

## 2012-02-02 ENCOUNTER — Other Ambulatory Visit: Payer: Self-pay | Admitting: Cardiology

## 2012-02-02 VITALS — BP 133/82 | HR 68 | Ht 65.5 in | Wt 159.0 lb

## 2012-02-02 DIAGNOSIS — I10 Essential (primary) hypertension: Secondary | ICD-10-CM

## 2012-02-02 DIAGNOSIS — R0602 Shortness of breath: Secondary | ICD-10-CM

## 2012-02-02 NOTE — Assessment & Plan Note (Signed)
Blood pressure is well controlled. No changes in medications at this time.

## 2012-02-02 NOTE — Assessment & Plan Note (Signed)
I will plan for PFTs to evaluate extent of COPD and may refer to Dr. Juanetta Gosling or [facility] if significantly abnormal. I do not see evidence of fluid retention on exam. She has had labs completed but results are not available at this time. Will see her after PFT's are completed. She will continue current medications as directed.

## 2012-02-02 NOTE — Patient Instructions (Signed)
Your physician recommends that you schedule a follow-up appointment in: 6 months  Your physician has recommended that you have a pulmonary function test. Pulmonary Function Tests are a group of tests that measure how well air moves in and out of your lungs.   

## 2012-02-02 NOTE — Progress Notes (Signed)
HPI: [name] is a 76 y/o patient of Dr. Diona Browner we are following for ongoing assessment of chronic Tachybrady syndrome s/p Medtronic Pacemaker, DOE, Atrial fibrillation, COPD, and chronic complaints of fluid retention. She is here with the same complaints of DOE and which she believes to be fluid retention, bloating feeling in her abdomen, but no edema. She was told to hold the lasix she takes for a couple of days by her PCP as she was feeling weak and tired. She is seeing [name] for GI and has a CT scan of the abdomen planned in the am.  Allergies  Allergen Reactions  . Amitriptyline Hcl Other (See Comments)    Caused "jaws to twist and lock"  . Sulfonamide Derivatives     UNKNOWN REACTION    Current Outpatient Prescriptions  Medication Sig Dispense Refill  . ALPRAZolam (XANAX) 0.5 MG tablet Take 0.25-0.5 mg by mouth daily as needed. For anxiety      . amiodarone (PACERONE) 100 MG tablet Take 100 mg by mouth every morning. Decrease in dose       . aspirin 325 MG tablet Take 325 mg by mouth daily.        . B Complex Vitamins (B COMPLEX-B12 PO) Take 1 tablet by mouth every morning.       . clopidogrel (PLAVIX) 75 MG tablet Take 75 mg by mouth every morning.      Marland Kitchen esomeprazole (NEXIUM) 40 MG capsule Take 40 mg by mouth 2 (two) times daily.       . folic acid (FOLVITE) 1 MG tablet Take 1 mg by mouth every morning.      . furosemide (LASIX) 40 MG tablet Take 80 mg in am and 40 mg in afternoon  90 tablet  3  . HYDROcodone-acetaminophen (NORCO) 5-325 MG per tablet Take 1 tablet by mouth every 6 (six) hours as needed for pain.  60 tablet  5  . isosorbide mononitrate (IMDUR) 30 MG 24 hr tablet Take 30 mg by mouth at bedtime.      . metoprolol (TOPROL-XL) 50 MG 24 hr tablet Take 50 mg by mouth every morning.        . nitroGLYCERIN (NITROSTAT) 0.4 MG SL tablet Place 0.4 mg under the tongue. Take as directed for chest pain       . ondansetron (ZOFRAN) 4 MG tablet Take 4 mg by mouth. As  needed for nausea       . potassium chloride SA (K-DUR,KLOR-CON) 20 MEQ tablet Take 20 mEq by mouth 2 (two) times daily.      . primidone (MYSOLINE) 50 MG tablet Take 50 mg by mouth 2 (two) times daily.       . rifaximin (XIFAXAN) 550 MG TABS Take 1 tablet (550 mg total) by mouth 2 (two) times daily.  20 tablet  0  . rosuvastatin (CRESTOR) 40 MG tablet Take 1 tablet (40 mg total) by mouth at bedtime.  30 tablet  11  . vitamin E 400 UNIT capsule Take 400 Units by mouth daily.          Past Medical History  Diagnosis Date  . Atrial fibrillation   . Coronary atherosclerosis of native coronary artery     DES x 2 to RCA 10/10  . GERD (gastroesophageal reflux disease)   . Tachycardia-bradycardia syndrome     s/p Medtronic Adapta L dual chamber device  5/10  . Hyperlipidemia   . COPD (chronic obstructive pulmonary disease)   .  Anxiety   . Resting tremor   . 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 Chronic back pain   . Pericardial effusion     Hemorrhagic   . Essential hypertension, benign   . Headache   . H/O hiatal hernia   . Neuromuscular disorder     Tremors    Past Surgical History  Procedure Date  . Cholecystectomy   . Appendectomy   . Subxiphoid pericardial window 11/10  . Esophagogastroduodenoscopy with esophageal dilation 2004, 2006, 2007  . Vaginal hysterectomy   . Colonoscopy 2011  . Right rotator cuff repair   . Insert / replace / remove pacemaker     [id number] of systems complete and found to be negative unless listed above  PHYSICAL EXAM BP 133/82  Pulse 68  Ht 5' 5.5" (1.664 m)  Wt 159 lb (72.122 kg)  BMI 26.06 kg/m2  General: Well developed, well nourished, in no acute distress Head: Eyes PERRLA, No xanthomas.   Normal cephalic and atramatic  Lungs: Clear bilaterally to auscultation and percussion. Heart: HRRR S1 S2, without MRG.  Pulses are 2+ & equal.            No carotid bruit. No JVD.  No abdominal bruits. No femoral  bruits. Abdomen: Bowel sounds are positive, abdomen soft and non-tender without masses or                  Hernia's noted. Msk:  Back normal, normal gait. Normal strength and tone for age. Extremities: No clubbing, cyanosis or edema.  DP +1 Neuro: Alert and oriented X 3. Psych:  Good affect, responds appropriately   ASSESSMENT AND PLAN

## 2012-02-03 ENCOUNTER — Ambulatory Visit (HOSPITAL_COMMUNITY)
Admission: RE | Admit: 2012-02-03 | Discharge: 2012-02-03 | Disposition: A | Discharge status: Home / Self Care | Payer: Medicare Other | Source: Ambulatory Visit | Attending: Internal Medicine | Admitting: Internal Medicine

## 2012-02-03 ENCOUNTER — Ambulatory Visit (HOSPITAL_COMMUNITY): Payer: Medicare Other | Admitting: Physical Therapy

## 2012-02-03 DIAGNOSIS — R109 Unspecified abdominal pain: Secondary | ICD-10-CM | POA: Insufficient documentation

## 2012-02-03 DIAGNOSIS — Z1231 Encounter for screening mammogram for malignant neoplasm of breast: Secondary | ICD-10-CM | POA: Insufficient documentation

## 2012-02-03 DIAGNOSIS — R112 Nausea with vomiting, unspecified: Secondary | ICD-10-CM | POA: Insufficient documentation

## 2012-02-03 DIAGNOSIS — Z139 Encounter for screening, unspecified: Secondary | ICD-10-CM

## 2012-02-03 DIAGNOSIS — R140 Abdominal distension (gaseous): Secondary | ICD-10-CM

## 2012-02-03 LAB — BASIC METABOLIC PANEL
BUN: 10 mg/dL (ref 6–23)
CO2: 25 mEq/L (ref 19–32)
Calcium: 10.1 mg/dL (ref 8.4–10.5)
Creat: 0.76 mg/dL (ref 0.50–1.10)
Glucose, Bld: 78 mg/dL (ref 70–99)

## 2012-02-03 MED ORDER — IOHEXOL 300 MG/ML  SOLN
100.0000 mL | Freq: Once | INTRAMUSCULAR | Status: AC | PRN
  Administered 2012-02-03: 100 mL via INTRAVENOUS

## 2012-02-04 ENCOUNTER — Ambulatory Visit: Payer: Medicare Other | Admitting: Cardiology

## 2012-02-11 ENCOUNTER — Ambulatory Visit (HOSPITAL_COMMUNITY): Payer: Medicare Other

## 2012-02-11 ENCOUNTER — Ambulatory Visit (HOSPITAL_COMMUNITY): Admission: RE | Admit: 2012-02-11 | Payer: Medicare Other | Source: Ambulatory Visit

## 2012-02-16 ENCOUNTER — Ambulatory Visit (HOSPITAL_COMMUNITY): Payer: Medicare Other | Admitting: Physical Therapy

## 2012-02-18 ENCOUNTER — Telehealth: Payer: Self-pay | Admitting: Cardiology

## 2012-02-18 NOTE — Telephone Encounter (Signed)
Pt. Advised, she verbalized understanding./LV 

## 2012-02-18 NOTE — Telephone Encounter (Signed)
Pt wants lynn to call her regarding her last visit and medication.

## 2012-03-31 ENCOUNTER — Ambulatory Visit: Payer: Medicare Other | Admitting: Orthopedic Surgery

## 2012-04-02 ENCOUNTER — Ambulatory Visit (INDEPENDENT_AMBULATORY_CARE_PROVIDER_SITE_OTHER): Payer: Medicare Other | Admitting: Cardiology

## 2012-04-02 ENCOUNTER — Encounter: Payer: Self-pay | Admitting: *Deleted

## 2012-04-02 ENCOUNTER — Encounter: Payer: Self-pay | Admitting: Cardiology

## 2012-04-02 VITALS — BP 132/82 | HR 83 | Ht 65.5 in | Wt 163.0 lb

## 2012-04-02 DIAGNOSIS — I10 Essential (primary) hypertension: Secondary | ICD-10-CM

## 2012-04-02 DIAGNOSIS — I4891 Unspecified atrial fibrillation: Secondary | ICD-10-CM

## 2012-04-02 DIAGNOSIS — I495 Sick sinus syndrome: Secondary | ICD-10-CM

## 2012-04-02 DIAGNOSIS — I2510 Atherosclerotic heart disease of native coronary artery without angina pectoris: Secondary | ICD-10-CM

## 2012-04-02 NOTE — Assessment & Plan Note (Signed)
No change to current regimen. 

## 2012-04-02 NOTE — Patient Instructions (Signed)
Your physician recommends that you schedule a follow-up appointment in: 2 months  Your physician recommends that you return for lab work in: 2 months, prior to office visit

## 2012-04-02 NOTE — Assessment & Plan Note (Signed)
Atrial paced rhythm by ECG. Keep PPM followup with Dr. Ladona Ridgel.

## 2012-04-02 NOTE — Assessment & Plan Note (Signed)
Continue medical therapy and observation. Cardiac catheterization from April of this year reviewed.

## 2012-04-02 NOTE — Assessment & Plan Note (Signed)
Her rhythm was been stable on Amiodarone. She is not anticoagulated with history of hemorrhagic pericardial effusion.

## 2012-04-02 NOTE — Progress Notes (Signed)
Clinical Summary Ms. Eng is a 76 y.o.female presenting for followup. She was seen in July by our NP. She reports no change in her chronic shortness of breath. Occasional atypical chest pain. ECG today shows atrial paced rhythm with no acute ST changes.  Labwork in July showed potassium 4.6, BUN 10, creatinine 0.7. Weight is up 4 pounds from the last visit. She continues on Lasix, sometimes she reduces the AM dose.   Allergies  Allergen Reactions  . Amitriptyline Hcl Other (See Comments)    Caused "jaws to twist and lock"  . Sulfonamide Derivatives     UNKNOWN REACTION    Current Outpatient Prescriptions  Medication Sig Dispense Refill  . ALPRAZolam (XANAX) 0.5 MG tablet Take 0.25-0.5 mg by mouth daily as needed. For anxiety      . amiodarone (PACERONE) 100 MG tablet Take 100 mg by mouth every morning. Decrease in dose       . aspirin 325 MG tablet Take 325 mg by mouth daily.        . B Complex Vitamins (B COMPLEX-B12 PO) Take 1 tablet by mouth every morning.       . clopidogrel (PLAVIX) 75 MG tablet Take 75 mg by mouth every morning.      Marland Kitchen esomeprazole (NEXIUM) 40 MG capsule Take 40 mg by mouth 2 (two) times daily.       . folic acid (FOLVITE) 1 MG tablet Take 1 mg by mouth every morning.      . furosemide (LASIX) 40 MG tablet Take 80 mg in am and 40 mg in afternoon  90 tablet  3  . isosorbide mononitrate (IMDUR) 30 MG 24 hr tablet Take 30 mg by mouth at bedtime.      . metoprolol (TOPROL-XL) 50 MG 24 hr tablet Take 50 mg by mouth every morning.        . nitroGLYCERIN (NITROSTAT) 0.4 MG SL tablet Place 0.4 mg under the tongue. Take as directed for chest pain       . ondansetron (ZOFRAN) 4 MG tablet Take 4 mg by mouth. As needed for nausea       . potassium chloride SA (K-DUR,KLOR-CON) 20 MEQ tablet Take 20 mEq by mouth 2 (two) times daily.      . primidone (MYSOLINE) 50 MG tablet Take 50 mg by mouth 2 (two) times daily.       . rifaximin (XIFAXAN) 550 MG TABS Take 1 tablet (550  mg total) by mouth 2 (two) times daily.  20 tablet  0  . rosuvastatin (CRESTOR) 40 MG tablet Take 1 tablet (40 mg total) by mouth at bedtime.  30 tablet  11  . vitamin E 400 UNIT capsule Take 400 Units by mouth daily.          Past Medical History  Diagnosis Date  . Atrial fibrillation   . Coronary atherosclerosis of native coronary artery     DES x 2 to RCA 10/10  . GERD (gastroesophageal reflux disease)   . Tachycardia-bradycardia syndrome     s/p Medtronic Adapta L dual chamber device  5/10  . Hyperlipidemia   . COPD (chronic obstructive pulmonary disease)   . Anxiety   . Resting tremor   . 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 Chronic back pain   . Pericardial effusion     Hemorrhagic   . Essential hypertension, benign   . Headache   . H/O hiatal hernia   .  Neuromuscular disorder     Tremors    Social History Ms. Economou reports that she has never smoked. She has never used smokeless tobacco. Ms. Azzaro reports that she does not drink alcohol.  Review of Systems She has occasional palpitations, no syncope. No reported bleeding episodes. Stable tremors, occasional headaches. Otherwise negative.  Physical Examination Filed Vitals:   04/02/12 1357  BP: 132/82  Pulse: 83   Chronically ill-appearing woman in no acute distress.  HEENT: Conjunctiva and lids are normal, oropharynx clear.  Neck: Supple, no JVD, no bruits.  Lungs: Clear to auscultation with diminished breath sounds mainly at bases.  Cardiac: Regular rate and rhythm without S3 gallop. No pericardial rub or knock.  Abdomen: Soft, nontender, bowel sounds present  Extremities: Trace edema.     Problem List and Plan   CORONARY ATHEROSCLEROSIS NATIVE CORONARY ARTERY Continue medical therapy and observation. Cardiac catheterization from April of this year reviewed.  BRADYCARDIA-TACHYCARDIA SYNDROME Atrial paced rhythm by ECG. Keep PPM followup with Dr. Ladona Ridgel.  Atrial fibrillation Her  rhythm was been stable on Amiodarone. She is not anticoagulated with history of hemorrhagic pericardial effusion.  Essential hypertension, benign No change to current regimen.    Jonelle Sidle, M.D., F.A.C.C.

## 2012-04-21 ENCOUNTER — Ambulatory Visit: Payer: Medicare Other | Admitting: Orthopedic Surgery

## 2012-04-21 ENCOUNTER — Encounter: Payer: Self-pay | Admitting: Orthopedic Surgery

## 2012-04-26 ENCOUNTER — Encounter: Payer: Self-pay | Admitting: Cardiology

## 2012-04-26 ENCOUNTER — Telehealth: Payer: Self-pay | Admitting: *Deleted

## 2012-04-26 MED ORDER — OMEGA-3 FATTY ACIDS 1000 MG PO CAPS: 1.0000 g | ORAL_CAPSULE | Freq: Every day | ORAL | Status: DC

## 2012-04-26 NOTE — Telephone Encounter (Signed)
Lab work received from Dr Margo Aye and was reviewed by Dr Diona Browner.  Triglycerides 593, therefore, order received to add omega 3 1 gram bid to patient's regimen.  Pt contacted and verbalizes understanding.

## 2012-05-28 ENCOUNTER — Other Ambulatory Visit: Payer: Self-pay | Admitting: *Deleted

## 2012-05-28 DIAGNOSIS — I2510 Atherosclerotic heart disease of native coronary artery without angina pectoris: Secondary | ICD-10-CM

## 2012-06-04 ENCOUNTER — Encounter: Payer: Self-pay | Admitting: *Deleted

## 2012-06-04 ENCOUNTER — Other Ambulatory Visit: Payer: Self-pay | Admitting: Cardiology

## 2012-06-24 ENCOUNTER — Other Ambulatory Visit: Payer: Self-pay | Admitting: Cardiology

## 2012-06-25 ENCOUNTER — Encounter: Payer: Self-pay | Admitting: *Deleted

## 2012-06-25 LAB — BASIC METABOLIC PANEL
CO2: 28 mEq/L (ref 19–32)
Chloride: 106 mEq/L (ref 96–112)
Potassium: 4.7 mEq/L (ref 3.5–5.3)
Sodium: 143 mEq/L (ref 135–145)

## 2012-06-28 ENCOUNTER — Telehealth: Payer: Self-pay | Admitting: Cardiology

## 2012-06-28 NOTE — Telephone Encounter (Signed)
PT CALLING FOR LAB RESULTS/TMJ

## 2012-06-28 NOTE — Telephone Encounter (Signed)
Spoke to pt to advise results/instructions. Pt understood. Advised that her labs were normal and were mailed out on 06-25-12, thus she will be getting those in the mail soon, pt asked about her cholesterol labs, reviewed MD Diona Browner notes and no indicators found to advise another lipid panal to be performed per pt had recent lipid panel 04-22-12, advised pt reason for not re-drawing cholesterol labs, pt understood and advised we will see pt on 07-12-12 with MD Ladona Ridgel and 08-17-12 with MD Diona Browner, pt understood all and will keep her upcoming apts

## 2012-06-30 ENCOUNTER — Ambulatory Visit: Payer: Medicare Other | Admitting: Cardiology

## 2012-07-07 ENCOUNTER — Encounter: Payer: Self-pay | Admitting: *Deleted

## 2012-07-12 ENCOUNTER — Ambulatory Visit (INDEPENDENT_AMBULATORY_CARE_PROVIDER_SITE_OTHER): Payer: Medicare Other | Admitting: Internal Medicine

## 2012-07-12 ENCOUNTER — Encounter: Payer: Self-pay | Admitting: Internal Medicine

## 2012-07-12 ENCOUNTER — Telehealth: Payer: Self-pay | Admitting: Cardiology

## 2012-07-12 ENCOUNTER — Ambulatory Visit: Payer: Medicare Other | Admitting: Cardiology

## 2012-07-12 VITALS — BP 150/90 | HR 68 | Ht 66.5 in | Wt 157.0 lb

## 2012-07-12 DIAGNOSIS — I10 Essential (primary) hypertension: Secondary | ICD-10-CM

## 2012-07-12 DIAGNOSIS — Z950 Presence of cardiac pacemaker: Secondary | ICD-10-CM

## 2012-07-12 DIAGNOSIS — I4891 Unspecified atrial fibrillation: Secondary | ICD-10-CM

## 2012-07-12 DIAGNOSIS — I495 Sick sinus syndrome: Secondary | ICD-10-CM

## 2012-07-12 LAB — PACEMAKER DEVICE OBSERVATION
AL AMPLITUDE: 1 mv
BAMS-0001: 175 {beats}/min
BATTERY VOLTAGE: 2.8 V
RV LEAD THRESHOLD: 1 V
VENTRICULAR PACING PM: 0

## 2012-07-12 NOTE — Assessment & Plan Note (Signed)
Her blood pressure today is slightly elevated. She admits to sodium indiscretion. I've asked the patient to reduce his salt intake. She will continue her beta blocker therapy. If her blood pressure remains elevated, we could consider switching from metoprolol to carvedilol.

## 2012-07-12 NOTE — Telephone Encounter (Signed)
Patient wants to know if she should adjust her fluid pill since she thinks that she may be retaining fluid.

## 2012-07-12 NOTE — Patient Instructions (Signed)
Your physician recommends that you schedule a follow-up appointment in: 1 year with Dr Taylor 6 months with Paula  

## 2012-07-12 NOTE — Assessment & Plan Note (Signed)
Her Medtronic dual-chamber pacemaker is working normally. We'll plan to recheck in several months. 

## 2012-07-12 NOTE — Telephone Encounter (Signed)
Patient just saw Dr Ladona Ridgel today and discussed the fact she had some increase in fluid retention.  Dr Ladona Ridgel has already addressed this with her and she admitted to some indiscretion with her sodium intake.  I advised her that if he felt she needed to go back on lasix, he would have had her to do so.  Encouraged her to keep track of her weights and call if she had > 4 pound weight gain and we would re visit the issue.  She has a follow up appt scheduled with Dr Diona Browner in January.  Verbalized understanding.

## 2012-07-12 NOTE — Progress Notes (Signed)
HPI [name] returns today for followup. She is a 76 year old woman with a history of tachybrady syndrome, coronary disease, status post permanent pacemaker insertion. The patient is not thought to be a Coumadin candidate, particularly in light of her need for aspirin and Plavix. In the interim, she denies chest pain. She has minimal palpitation. No shortness of breath. No syncope. Allergies  Allergen Reactions  . Amitriptyline Hcl Other (See Comments)    Caused "jaws to twist and lock"  . Sulfonamide Derivatives     UNKNOWN REACTION     Current Outpatient Prescriptions  Medication Sig Dispense Refill  . ALPRAZolam (XANAX) 0.5 MG tablet Take 0.25-0.5 mg by mouth daily as needed. For anxiety      . amiodarone (PACERONE) 100 MG tablet Take 100 mg by mouth every morning. Decrease in dose       . aspirin 81 MG chewable tablet Chew 162 mg by mouth daily.      . clopidogrel (PLAVIX) 75 MG tablet Take 75 mg by mouth every morning.      Marland Kitchen esomeprazole (NEXIUM) 40 MG capsule Take 40 mg by mouth 2 (two) times daily.       . furosemide (LASIX) 40 MG tablet Take 40 mg by mouth 2 (two) times daily.      . metoprolol (TOPROL-XL) 50 MG 24 hr tablet Take 50 mg by mouth every morning.        . nitroGLYCERIN (NITROSTAT) 0.4 MG SL tablet Place 0.4 mg under the tongue. Take as directed for chest pain       . omega-3 acid ethyl esters (LOVAZA) 1 G capsule Take 2 g by mouth 2 (two) times daily.      . ondansetron (ZOFRAN) 4 MG tablet Take 4 mg by mouth. As needed for nausea       . potassium chloride SA (K-DUR,KLOR-CON) 20 MEQ tablet Take 20 mEq by mouth 2 (two) times daily.      . primidone (MYSOLINE) 50 MG tablet Take 25 mg by mouth 2 (two) times daily.       . rosuvastatin (CRESTOR) 40 MG tablet Take 1 tablet (40 mg total) by mouth at bedtime.  30 tablet  11  . vitamin E 400 UNIT capsule Take 400 Units by mouth daily.        . [DISCONTINUED] furosemide (LASIX) 40 MG tablet Take 80 mg in am and 40 mg in  afternoon  90 tablet  3  . [DISCONTINUED] amiodarone (PACERONE) 200 MG tablet TAKE (1/2) TABLET BY MOUTH DAILY.  15 tablet  6     Past Medical History  Diagnosis Date  . Atrial fibrillation   . Coronary atherosclerosis of native coronary artery     DES x 2 to RCA 10/10  . GERD (gastroesophageal reflux disease)   . Tachycardia-bradycardia syndrome     s/p Medtronic Adapta L dual chamber device  5/10  . Hyperlipidemia   . COPD (chronic obstructive pulmonary disease)   . Anxiety   . Resting tremor   . 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 Chronic back pain   . Pericardial effusion     Hemorrhagic   . Essential hypertension, benign   . Headache   . H/O hiatal hernia   . Neuromuscular disorder     Tremors    ROS:   All systems reviewed and negative except as noted in the HPI.   Past Surgical History  Procedure Date  . Cholecystectomy   .  Appendectomy   . Subxiphoid pericardial window 11/10  . Esophagogastroduodenoscopy with esophageal dilation 2004, 2006, 2007  . Vaginal hysterectomy   . Colonoscopy 2011  . Right rotator cuff repair   . Insert / replace / remove pacemaker      Family History  Problem Relation Age of Onset  . Cancer Mother     Colon   . Coronary artery disease Sister   . Coronary artery disease Brother   . Arthritis    . Lung disease    . Asthma       History   Social History  . Marital Status: Married    Spouse Name: N/A    Number of Children: N/A  . Years of Education: N/A   Occupational History  . Retired    Social History Main Topics  . Smoking status: Never Smoker   . Smokeless tobacco: Never Used  . Alcohol Use: No  . Drug Use: No  . Sexually Active: Not on file   Other Topics Concern  . Not on file   Social History Narrative   She lives alone, has adopted daughter.     BP 150/90  Pulse 68  Ht 5' 6.5" (1.689 m)  Wt 157 lb (71.215 kg)  BMI 24.96 kg/m2  Physical Exam:  Well appearing  76 year old woman, NAD HEENT: Unremarkable Neck:  No JVD, no thyromegally Lungs:  Clear with no wheezes, rales, or rhonchi. Well-healed pacemaker incision. HEART:  Regular rate rhythm, no murmurs, no rubs, no clicks Abd:  soft, positive bowel sounds, no organomegally, no rebound, no guarding Ext:  2 plus pulses, no edema, no cyanosis, no clubbing Skin:  No rashes no nodules Neuro:  CN II through XII intact, motor grossly intact  DEVICE  Normal device function.  See PaceArt for details.   Assess/Plan:

## 2012-07-12 NOTE — Assessment & Plan Note (Signed)
She is maintaining sinus rhythm very nicely on low-dose amiodarone therapy. Her longest mode switch has been less than an hour. She is in sinus rhythm 99.9% of the time.

## 2012-07-13 ENCOUNTER — Encounter: Payer: Self-pay | Admitting: Internal Medicine

## 2012-08-17 ENCOUNTER — Ambulatory Visit (INDEPENDENT_AMBULATORY_CARE_PROVIDER_SITE_OTHER): Payer: Medicare Other | Admitting: Cardiology

## 2012-08-17 ENCOUNTER — Encounter: Payer: Self-pay | Admitting: Cardiology

## 2012-08-17 VITALS — BP 122/74 | HR 84 | Ht 66.0 in | Wt 156.0 lb

## 2012-08-17 DIAGNOSIS — I4891 Unspecified atrial fibrillation: Secondary | ICD-10-CM

## 2012-08-17 DIAGNOSIS — I10 Essential (primary) hypertension: Secondary | ICD-10-CM

## 2012-08-17 DIAGNOSIS — I2510 Atherosclerotic heart disease of native coronary artery without angina pectoris: Secondary | ICD-10-CM

## 2012-08-17 DIAGNOSIS — Z950 Presence of cardiac pacemaker: Secondary | ICD-10-CM

## 2012-08-17 NOTE — Patient Instructions (Addendum)
Your physician recommends that you schedule a follow-up appointment in: 6 MONTHS WITH SM  Your physician recommends that you return for lab work in: PRIOR TO OFFICE VISIT IN 6 MONTHS (BMET) WE WILL MAIL YOU A REMINDER LETTER TO ALERT YOU TO HAVE YOUR LABS DRAWN

## 2012-08-17 NOTE — Assessment & Plan Note (Signed)
Blood pressure control is good today. 

## 2012-08-17 NOTE — Assessment & Plan Note (Signed)
Maintaining sinus rhythm on amiodarone for the vast majority of the time based on device interrogation. Not anticoagulated with ongoing need for DAPT, and previous history of hemorrhagic pericardial effusion.

## 2012-08-17 NOTE — Assessment & Plan Note (Signed)
Keep followup with Dr. Taylor. 

## 2012-08-17 NOTE — Assessment & Plan Note (Addendum)
No active angina on medical therapy. Continue observation. LVEF has been normal although she does have diastolic dysfunction. Continue current sodium restriction and diuretic therapy. Followup BMET for next visit.

## 2012-08-17 NOTE — Progress Notes (Signed)
Clinical Summary [name] is a medically complex 77 y.o.female presenting for followup. I last saw her in August 2013. She did have interval followup with Dr. Ladona Ridgel in December. Device interrogation revealed that she was in sinus rhythm for the vast majority of the time on medical therapy.  Weight is down 7 pounds from August 2013. She states that she has been paying more attention to her diet and sodium intake. Typically takes 1-1/2 40 mg Lasix pills a day, sometimes 2.  She reports that she has been under a significant amount of stress, but feels that she is doing well with it. Her daughter has been ill and she has been having to care for her.  She reports no significant palpitations, no angina, stable dyspnea on exertion.   Allergies  Allergen Reactions  . Amitriptyline Hcl Other (See Comments)    Caused "jaws to twist and lock"  . Sulfonamide Derivatives     UNKNOWN REACTION    Current Outpatient Prescriptions  Medication Sig Dispense Refill  . ALPRAZolam (XANAX) 0.5 MG tablet Take 0.25-0.5 mg by mouth daily as needed. For anxiety      . amiodarone (PACERONE) 100 MG tablet Take 100 mg by mouth every morning. Decrease in dose       . aspirin 81 MG chewable tablet Chew 162 mg by mouth daily.      . clopidogrel (PLAVIX) 75 MG tablet Take 75 mg by mouth every morning.      Marland Kitchen esomeprazole (NEXIUM) 40 MG capsule Take 40 mg by mouth 2 (two) times daily.       . furosemide (LASIX) 40 MG tablet Take 40 mg by mouth 2 (two) times daily.      . metoprolol (TOPROL-XL) 50 MG 24 hr tablet Take 50 mg by mouth every morning.        . Multiple Vitamin (MULTIVITAMIN) tablet Take 1 tablet by mouth daily.      . nitroGLYCERIN (NITROSTAT) 0.4 MG SL tablet Place 0.4 mg under the tongue. Take as directed for chest pain       . omega-3 acid ethyl esters (LOVAZA) 1 G capsule Take 2 g by mouth 2 (two) times daily.      . ondansetron (ZOFRAN) 4 MG tablet Take 4 mg by mouth. As needed for nausea       .  potassium chloride SA (K-DUR,KLOR-CON) 20 MEQ tablet Take 20 mEq by mouth 2 (two) times daily.      . primidone (MYSOLINE) 50 MG tablet Take 25 mg by mouth 2 (two) times daily.       . rosuvastatin (CRESTOR) 40 MG tablet Take 1 tablet (40 mg total) by mouth at bedtime.  30 tablet  11  . vitamin E 400 UNIT capsule Take 400 Units by mouth daily.          Past Medical History  Diagnosis Date  . Atrial fibrillation   . Coronary atherosclerosis of native coronary artery     DES x 2 to RCA 10/10  . GERD (gastroesophageal reflux disease)   . Tachycardia-bradycardia syndrome     s/p Medtronic Adapta L dual chamber device  5/10  . Hyperlipidemia   . COPD (chronic obstructive pulmonary disease)   . Anxiety   . Resting tremor   . 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 Chronic back pain   . Pericardial effusion     Hemorrhagic   . Essential hypertension, benign   .  Headache   . H/O hiatal hernia   . Neuromuscular disorder     Tremors    Social History Ms. Dunivan reports that she has never smoked. She has never used smokeless tobacco. Ms. Kleist reports that she does not drink alcohol.  Review of Systems Chronic tremor, stable appetite, no orthopnea or PND. Otherwise negative.  Physical Examination Filed Vitals:   08/17/12 0826  BP: 122/74  Pulse: 84   Filed Weights   08/17/12 0826  Weight: 156 lb 0.6 oz (70.779 kg)    Chronically ill-appearing woman in no acute distress.  HEENT: Conjunctiva and lids are normal, oropharynx clear.  Neck: Supple, no JVD, no bruits.  Lungs: Clear to auscultation with diminished breath sounds mainly at bases.  Cardiac: Regular rate and rhythm without S3 gallop. No pericardial rub or knock.  Abdomen: Soft, nontender, bowel sounds present  Extremities: Trace edema.   Problem List and Plan   CORONARY ATHEROSCLEROSIS NATIVE CORONARY ARTERY No active angina on medical therapy. Continue observation. LVEF has been normal although  she does have diastolic dysfunction. Continue current sodium restriction and diuretic therapy. Followup BMET for next visit.  Essential hypertension, benign Blood-pressure control is good today.  Atrial fibrillation Maintaining sinus rhythm on amiodarone for the vast majority of the time based on device interrogation. Not anticoagulated with ongoing need for DAPT, and previous history of hemorrhagic pericardial effusion.  PPM-Medtronic Keep followup with Dr. Ladona Ridgel.    Jonelle Sidle, M.D., F.A.C.C.

## 2012-11-18 ENCOUNTER — Encounter (INDEPENDENT_AMBULATORY_CARE_PROVIDER_SITE_OTHER): Payer: Self-pay | Admitting: Internal Medicine

## 2012-11-18 ENCOUNTER — Ambulatory Visit (INDEPENDENT_AMBULATORY_CARE_PROVIDER_SITE_OTHER): Payer: Medicare Other | Admitting: Internal Medicine

## 2012-11-18 VITALS — BP 132/66 | HR 80 | Ht 66.0 in | Wt 158.5 lb

## 2012-11-18 DIAGNOSIS — R141 Gas pain: Secondary | ICD-10-CM

## 2012-11-18 DIAGNOSIS — R143 Flatulence: Secondary | ICD-10-CM

## 2012-11-18 DIAGNOSIS — K219 Gastro-esophageal reflux disease without esophagitis: Secondary | ICD-10-CM

## 2012-11-18 DIAGNOSIS — R140 Abdominal distension (gaseous): Secondary | ICD-10-CM

## 2012-11-18 NOTE — Patient Instructions (Addendum)
US abdomen 

## 2012-11-18 NOTE — Progress Notes (Signed)
Subjective:     Patient ID: [name], female   DOB: 01/30/1936, 77 y.o.   MRN: [id number]  HPIHere today with c/o bloating. She tells me her stomach is bloated.  She tells me she has a pulling in her rt flank.  She was last seen by [name] for the same symptoms. She underwent a CT (see below).  Appetite is okay. She has lost 2 pounds since her last visit in June of last year. No dysphagia. She has a BM about every day. No melena or bright red rectal bleeding.   She was seen at [facility] for evaluation of dysphagia last year for evaluation of dysphagia and had an esophageal manometry revealing normal LES resting pressure of 30.9. Relaxation was incomplete at 19.9 normal being less than 15. 30% of the swallows were simultaneous, one failed and 60% were peristaltic. Impedence study revealed incomplete fullness clear at 20%. He UES was hypertensive. She had some double peak contractions. She was diagnosed with diffuse esophageal spasm and LE dysfunction and Dr. Adriana Simas recommended using nitrates or calcium channel blockers.  There has been no weight loss. She clo bloating in her abdomen. She has a BM about every day.  Last EGD was 04/02/2009: Small sliding hiatal hernia, otherwise normal EGD. Esophageal dilatation with a 56 French Maloney dilator resulting in small linear tear at The cervical esophagus indicative of a web.  06/30/2009 Hemoglobin 10.8  08/2009 Hemoglobin 10.9  06/02/2011 Hemoglobin 10.4  06/04/2011 H and H 8.9 and 27.2, MCV 92.0, Vitamin B12 greater than 2000, Ferritin 12.  Her last colonoscopy was in 05/2010 which revealed external hemorrhoids, otherwise normal colonoscopy.   01/26/2012 CT abdomen/pelvis with CM: 1. No acute findings within the abdomen or pelvis.  2. Prior cholecystectomy.  3. Prior granulomatous disease.   Review of Systems See hpi Current Outpatient Prescriptions  Medication Sig Dispense Refill  . ALPRAZolam (XANAX) 0.5 MG tablet Take 0.25-0.5 mg by mouth daily as  needed. For anxiety      . amiodarone (PACERONE) 100 MG tablet Take 100 mg by mouth every morning. Decrease in dose       . aspirin 81 MG chewable tablet Chew 162 mg by mouth daily.      . clopidogrel (PLAVIX) 75 MG tablet Take 75 mg by mouth every morning.      Marland Kitchen esomeprazole (NEXIUM) 40 MG capsule Take 40 mg by mouth 2 (two) times daily.       . furosemide (LASIX) 40 MG tablet Take 40 mg by mouth 2 (two) times daily.      . metoprolol (TOPROL-XL) 50 MG 24 hr tablet Take 50 mg by mouth every morning.        . Multiple Vitamin (MULTIVITAMIN) tablet Take 1 tablet by mouth daily.      . nitroGLYCERIN (NITROSTAT) 0.4 MG SL tablet Place 0.4 mg under the tongue. Take as directed for chest pain       . ondansetron (ZOFRAN) 4 MG tablet Take 4 mg by mouth. As needed for nausea       . potassium chloride SA (K-DUR,KLOR-CON) 20 MEQ tablet Take 20 mEq by mouth 2 (two) times daily.      . primidone (MYSOLINE) 50 MG tablet Take 25 mg by mouth 2 (two) times daily.        No current facility-administered medications for this visit.   Past Medical History  Diagnosis Date  . Atrial fibrillation   . Coronary atherosclerosis of native coronary artery  DES x 2 to RCA 10/10  . GERD (gastroesophageal reflux disease)   . Tachycardia-bradycardia syndrome     s/p Medtronic Adapta L dual chamber device  5/10  . Hyperlipidemia   . COPD (chronic obstructive pulmonary disease)   . Anxiety   . Resting tremor   . 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 Chronic back pain   . Pericardial effusion     Hemorrhagic   . Essential hypertension, benign   . Headache   . H/O hiatal hernia   . Neuromuscular disorder     Tremors   Past Surgical History  Procedure Laterality Date  . Cholecystectomy    . Appendectomy    . Subxiphoid pericardial window  11/10  . Esophagogastroduodenoscopy with esophageal dilation  2004, 2006, 2007  . Vaginal hysterectomy    . Colonoscopy  2011  . Right rotator  cuff repair    . Insert / replace / remove pacemaker     Allergies  Allergen Reactions  . Amitriptyline Hcl Other (See Comments)    Caused "jaws to twist and lock"  . Sulfonamide Derivatives     UNKNOWN REACTION       Objective:   Physical Exam  Filed Vitals:   11/18/12 1423  BP: 132/66  Pulse: 80  Height: 5\' 6"  (1.676 m)  Weight: 158 lb 8 oz (71.895 kg)   Alert and oriented. Skin warm and dry. Oral mucosa is moist.   . Sclera anicteric, conjunctivae is pink. Thyroid not enlarged. No cervical lymphadenopathy. Lungs clear. Heart regular rate and rhythm.  Abdomen is soft. Bowel sounds are positive. No hepatomegaly. No abdominal masses felt. No tenderness.  No edema to lower extremities.       Assessment:   Bloating after eating.  EGD normal. Colonoscopy in 2011 normal. CT scan in 2013 normal.  Suspect her symptoms are related to GERD. Plan:    Will get an [place] to rule out a neoplastic process.

## 2012-11-23 ENCOUNTER — Ambulatory Visit (HOSPITAL_COMMUNITY): Payer: Medicare Other

## 2012-11-26 ENCOUNTER — Ambulatory Visit (HOSPITAL_COMMUNITY): Payer: Medicare Other

## 2012-12-01 ENCOUNTER — Ambulatory Visit (HOSPITAL_COMMUNITY): Payer: Medicare Other

## 2013-02-10 ENCOUNTER — Encounter: Payer: Self-pay | Admitting: *Deleted

## 2013-02-10 ENCOUNTER — Other Ambulatory Visit: Payer: Self-pay | Admitting: *Deleted

## 2013-02-10 DIAGNOSIS — I10 Essential (primary) hypertension: Secondary | ICD-10-CM

## 2013-02-17 ENCOUNTER — Encounter: Payer: Self-pay | Admitting: Cardiology

## 2013-02-17 ENCOUNTER — Ambulatory Visit (INDEPENDENT_AMBULATORY_CARE_PROVIDER_SITE_OTHER): Payer: Medicare Other | Admitting: Cardiology

## 2013-02-17 VITALS — BP 126/70 | HR 67 | Ht 66.0 in | Wt 161.0 lb

## 2013-02-17 DIAGNOSIS — I2510 Atherosclerotic heart disease of native coronary artery without angina pectoris: Secondary | ICD-10-CM

## 2013-02-17 DIAGNOSIS — I10 Essential (primary) hypertension: Secondary | ICD-10-CM

## 2013-02-17 DIAGNOSIS — I4891 Unspecified atrial fibrillation: Secondary | ICD-10-CM

## 2013-02-17 DIAGNOSIS — Z950 Presence of cardiac pacemaker: Secondary | ICD-10-CM

## 2013-02-17 NOTE — Assessment & Plan Note (Signed)
No active angina on medical therapy. Myoview in 2012 was negative for ischemia.

## 2013-02-17 NOTE — Assessment & Plan Note (Addendum)
Paroxysmal, reasonably well controlled on amiodarone. Requesting most recent lab work for review per Dr. Margo Aye. She is not anticoagulated with prior history of hemorrhagic pericardial effusion.

## 2013-02-17 NOTE — Assessment & Plan Note (Signed)
Keep followup with Dr. Taylor. 

## 2013-02-17 NOTE — Patient Instructions (Addendum)
Your physician recommends that you schedule a follow-up appointment in: 6 MONTHS WITH DR SM 

## 2013-02-17 NOTE — Assessment & Plan Note (Signed)
Blood pressure looks good today. No changes made to her regimen.

## 2013-02-17 NOTE — Progress Notes (Signed)
Clinical Summary [name] is a 77 y.o.female last seen in January. She states that she has actually done reasonably well, occasionally feels palpitations, but nothing progressive. No angina. Still using Lasix with as needed increased dosing with weight changes.  ECG today shows atrial pacing. Weight is 161 today from 158 last time.  She had followup labwork with Dr. Margo [name] recently - will request for review.  Echocardiogram from May 2013 revealed moderate basal hypertrophy with LVEF 60-65%, grade 2 diastolic dysfunction, mild to moderate mitral regurgitation, moderate left atrial enlargement, device wire in right heart, mild tricuspid regurgitation, PASP 33 mm mercury, no pericardial effusion.  She tells me that she may be having eye surgery at [facility], details to be determined.   Allergies  Allergen Reactions  . Amitriptyline Hcl Other (See Comments)    Caused "jaws to twist and lock"  . Sulfonamide Derivatives     UNKNOWN REACTION    Current Outpatient Prescriptions  Medication Sig Dispense Refill  . ALPRAZolam (XANAX) 0.5 MG tablet Take 0.25-0.5 mg by mouth daily as needed. For anxiety      . amiodarone (PACERONE) 200 MG tablet Take 100 mg by mouth daily.      Marland Kitchen aspirin 325 MG EC tablet Take 325 mg by mouth daily.      . clopidogrel (PLAVIX) 75 MG tablet Take 75 mg by mouth every morning.      . Cyanocobalamin (B-12 PO) Take by mouth as needed.      Marland Kitchen esomeprazole (NEXIUM) 40 MG capsule Take 40 mg by mouth 2 (two) times daily.       . furosemide (LASIX) 40 MG tablet Take 40-80 mg by mouth daily.       . metoprolol (TOPROL-XL) 50 MG 24 hr tablet Take 50 mg by mouth every morning.        . Multiple Vitamin (MULTIVITAMIN) tablet Take 1 tablet by mouth daily.      . nitroGLYCERIN (NITROSTAT) 0.4 MG SL tablet Place 0.4 mg under the tongue. Take as directed for chest pain       . ondansetron (ZOFRAN) 4 MG tablet Take 4 mg by mouth. As needed for nausea       . potassium chloride SA  (K-DUR,KLOR-CON) 20 MEQ tablet Take 20 mEq by mouth 2 (two) times daily.      . primidone (MYSOLINE) 50 MG tablet Take 25 mg by mouth daily.       Marland Kitchen VITAMIN D, CHOLECALCIFEROL, PO Take by mouth as needed.       No current facility-administered medications for this visit.    Past Medical History  Diagnosis Date  . Atrial fibrillation   . Coronary atherosclerosis of native coronary artery     DES x 2 to RCA 10/10  . GERD (gastroesophageal reflux disease)   . Tachycardia-bradycardia syndrome     s/p Medtronic Adapta L dual chamber device  5/10  . Hyperlipidemia   . COPD (chronic obstructive pulmonary disease)   . Anxiety   . Resting tremor   . 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 Chronic back pain   . Pericardial effusion     Hemorrhagic   . Essential hypertension, benign   . Headache(784.0)   . H/O hiatal hernia   . Neuromuscular disorder     Tremors    Social History [name] reports that she has never smoked. She has never used smokeless tobacco. [name] reports that she does not  drink alcohol.  Review of Systems Stable appetite.Using oxygen at nighttime. No orthopnea. Stable tremor.Still dealing with stress. Otherwise negative.  Physical Examination Filed Vitals:   02/17/13 0904  BP: 126/70  Pulse: 67   Filed Weights   02/17/13 0904  Weight: 161 lb (73.029 kg)    Chronically ill-appearing woman, comfortable at rest.  HEENT: Conjunctiva and lids are normal, oropharynx clear.  Neck: Supple, no JVD, no bruits.  Lungs: Clear to auscultation with diminished breath sounds mainly at bases.  Cardiac: Regular rate and rhythm without S3 gallop. No pericardial rub or knock.  Abdomen: Soft, nontender, bowel sounds present  Extremities: Trace edema.   Problem List and Plan   CORONARY ATHEROSCLEROSIS NATIVE CORONARY ARTERY No active angina on medical therapy. Myoview in 2012 was negative for ischemia.  Essential hypertension, benign Blood  pressure looks good today. No changes made to her regimen.  Atrial fibrillation Paroxysmal, reasonably well controlled on amiodarone. Requesting most recent lab work for review per Dr. Margo [name]. She is not anticoagulated with prior history of hemorrhagic pericardial effusion.  PPM-Medtronic Keep follow up with Dr. Ladona [name].    [name], M.D., F.A.C.C.

## 2013-02-25 ENCOUNTER — Ambulatory Visit (INDEPENDENT_AMBULATORY_CARE_PROVIDER_SITE_OTHER): Payer: Medicare Other | Admitting: *Deleted

## 2013-02-25 ENCOUNTER — Encounter: Payer: Self-pay | Admitting: Internal Medicine

## 2013-02-25 DIAGNOSIS — I4891 Unspecified atrial fibrillation: Secondary | ICD-10-CM

## 2013-02-25 DIAGNOSIS — I495 Sick sinus syndrome: Secondary | ICD-10-CM

## 2013-02-25 LAB — PACEMAKER DEVICE OBSERVATION
AL AMPLITUDE: 0.7 mv
BAMS-0001: 175 {beats}/min
BATTERY VOLTAGE: 2.79 V
RV LEAD AMPLITUDE: 8 mv
RV LEAD IMPEDENCE PM: 672 Ohm
VENTRICULAR PACING PM: 0

## 2013-02-25 NOTE — Progress Notes (Signed)
PPM check in office. 

## 2013-03-22 ENCOUNTER — Telehealth: Payer: Self-pay | Admitting: *Deleted

## 2013-03-22 NOTE — Telephone Encounter (Signed)
Pt advised that she was switched to Zocor 20mg  nightly per the crestor was causing her to have leg cramps too much, pt notes she was only taking the crestor 2 times a week for the past 3 months, pt notes she has not picked up the zocor at this time but will do so today.MD MCDOWELL made aware verbally, changed medication in pt chart Pt will call back to our office with any further concerns if necessary

## 2013-03-22 NOTE — Telephone Encounter (Signed)
Received labs from PCP office,

## 2013-05-25 ENCOUNTER — Emergency Department (HOSPITAL_COMMUNITY): Payer: Medicare Other

## 2013-05-25 ENCOUNTER — Emergency Department (HOSPITAL_COMMUNITY)
Admission: EM | Admit: 2013-05-25 | Discharge: 2013-05-25 | Disposition: A | Discharge status: Home / Self Care | Payer: Medicare Other | Attending: Emergency Medicine | Admitting: Emergency Medicine

## 2013-05-25 ENCOUNTER — Encounter (HOSPITAL_COMMUNITY): Payer: Self-pay | Admitting: Emergency Medicine

## 2013-05-25 DIAGNOSIS — K219 Gastro-esophageal reflux disease without esophagitis: Secondary | ICD-10-CM | POA: Insufficient documentation

## 2013-05-25 DIAGNOSIS — G8929 Other chronic pain: Secondary | ICD-10-CM | POA: Insufficient documentation

## 2013-05-25 DIAGNOSIS — F411 Generalized anxiety disorder: Secondary | ICD-10-CM | POA: Insufficient documentation

## 2013-05-25 DIAGNOSIS — Z79899 Other long term (current) drug therapy: Secondary | ICD-10-CM | POA: Insufficient documentation

## 2013-05-25 DIAGNOSIS — Y9389 Activity, other specified: Secondary | ICD-10-CM | POA: Insufficient documentation

## 2013-05-25 DIAGNOSIS — I4891 Unspecified atrial fibrillation: Secondary | ICD-10-CM | POA: Insufficient documentation

## 2013-05-25 DIAGNOSIS — Y929 Unspecified place or not applicable: Secondary | ICD-10-CM | POA: Insufficient documentation

## 2013-05-25 DIAGNOSIS — I2510 Atherosclerotic heart disease of native coronary artery without angina pectoris: Secondary | ICD-10-CM | POA: Insufficient documentation

## 2013-05-25 DIAGNOSIS — I10 Essential (primary) hypertension: Secondary | ICD-10-CM | POA: Insufficient documentation

## 2013-05-25 DIAGNOSIS — S6991XA Unspecified injury of right wrist, hand and finger(s), initial encounter: Secondary | ICD-10-CM

## 2013-05-25 DIAGNOSIS — W230XXA Caught, crushed, jammed, or pinched between moving objects, initial encounter: Secondary | ICD-10-CM | POA: Insufficient documentation

## 2013-05-25 DIAGNOSIS — R209 Unspecified disturbances of skin sensation: Secondary | ICD-10-CM | POA: Insufficient documentation

## 2013-05-25 DIAGNOSIS — S6990XA Unspecified injury of unspecified wrist, hand and finger(s), initial encounter: Secondary | ICD-10-CM | POA: Insufficient documentation

## 2013-05-25 DIAGNOSIS — S6980XA Other specified injuries of unspecified wrist, hand and finger(s), initial encounter: Secondary | ICD-10-CM | POA: Insufficient documentation

## 2013-05-25 DIAGNOSIS — Z7902 Long term (current) use of antithrombotics/antiplatelets: Secondary | ICD-10-CM | POA: Insufficient documentation

## 2013-05-25 DIAGNOSIS — Z7982 Long term (current) use of aspirin: Secondary | ICD-10-CM | POA: Insufficient documentation

## 2013-05-25 DIAGNOSIS — J4489 Other specified chronic obstructive pulmonary disease: Secondary | ICD-10-CM | POA: Insufficient documentation

## 2013-05-25 DIAGNOSIS — E785 Hyperlipidemia, unspecified: Secondary | ICD-10-CM | POA: Insufficient documentation

## 2013-05-25 DIAGNOSIS — S61011A Laceration without foreign body of right thumb without damage to nail, initial encounter: Secondary | ICD-10-CM

## 2013-05-25 DIAGNOSIS — Z23 Encounter for immunization: Secondary | ICD-10-CM | POA: Insufficient documentation

## 2013-05-25 DIAGNOSIS — J449 Chronic obstructive pulmonary disease, unspecified: Secondary | ICD-10-CM | POA: Insufficient documentation

## 2013-05-25 DIAGNOSIS — S61209A Unspecified open wound of unspecified finger without damage to nail, initial encounter: Secondary | ICD-10-CM | POA: Insufficient documentation

## 2013-05-25 MED ORDER — TETANUS-DIPHTH-ACELL PERTUSSIS 5-2.5-18.5 LF-MCG/0.5 IM SUSP
0.5000 mL | Freq: Once | INTRAMUSCULAR | Status: AC
  Administered 2013-05-25: 0.5 mL via INTRAMUSCULAR
  Filled 2013-05-25: qty 0.5

## 2013-05-25 NOTE — ED Provider Notes (Signed)
CSN: [id number]     Arrival date & time 05/25/13  4098 History  This chart was scribed for Katrina Camel, MD by Quintella Reichert, ED scribe.  This patient was seen in room APA03/APA03 and the patient's care was started at 9:30 AM.  Chief Complaint  Patient presents with  . Hand Injury    right thumb    The history is provided by the patient. No language interpreter was used.    HPI Comments: [name] is a 77 y.o. female with h/o A-fib (on Plavix) who presents to the Emergency Department complaining of a right thumb injury sustained this morning.  Pt states she was getting out of her car this morning and accidentally caught her right thumb in between the door and her car.  Her car door was locked and she notes that her husband had to unlock the door from inside before she could take her thumb out.  She states that the thumb initially "bled a lot" but bleeding has since resolved.  The wound was washed prior to arrival.  Presently she states her thumb is numb.  She denies pain.  Pt does not recall her last tetanus vaccination.  She denies injury to any other area and has no other complaints at this time.   Past Medical History  Diagnosis Date  . Atrial fibrillation   . Coronary atherosclerosis of native coronary artery     DES x 2 to RCA 10/10  . GERD (gastroesophageal reflux disease)   . Tachycardia-bradycardia syndrome     s/p Medtronic Adapta L dual chamber device  5/10  . Hyperlipidemia   . COPD (chronic obstructive pulmonary disease)   . Anxiety   . Resting tremor   . 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 Chronic back pain   . Pericardial effusion     Hemorrhagic   . Essential hypertension, benign   . Headache(784.0)   . H/O hiatal hernia   . Neuromuscular disorder     Tremors    Past Surgical History  Procedure Laterality Date  . Cholecystectomy    . Appendectomy    . Subxiphoid pericardial window  11/10  . Esophagogastroduodenoscopy with  esophageal dilation  2004, 2006, 2007  . Vaginal hysterectomy    . Colonoscopy  2011  . Right rotator cuff repair    . Insert / replace / remove pacemaker      Family History  Problem Relation Age of Onset  . Cancer Mother     Colon   . Coronary artery disease Sister   . Coronary artery disease Brother   . Arthritis    . Lung disease    . Asthma      History  Substance Use Topics  . Smoking status: Never Smoker   . Smokeless tobacco: Never Used  . Alcohol Use: No    OB History   Grav Para Term Preterm Abortions TAB SAB Ect Mult Living                  Review of Systems  Skin: Positive for wound (right thumb).  Neurological: Positive for numbness (right thumb).  All other systems reviewed and are negative.     Allergies  Amitriptyline hcl and Sulfonamide derivatives  Home Medications   Current Outpatient Rx  Name  Route  Sig  Dispense  Refill  . ALPRAZolam (XANAX) 0.5 MG tablet   Oral   Take 0.25-0.5 mg by mouth daily  as needed. For anxiety         . amiodarone (PACERONE) 200 MG tablet   Oral   Take 100 mg by mouth daily.         Marland Kitchen aspirin 325 MG EC tablet   Oral   Take 325 mg by mouth daily.         . clopidogrel (PLAVIX) 75 MG tablet   Oral   Take 75 mg by mouth every morning.         . Cyanocobalamin (B-12 PO)   Oral   Take by mouth as needed.         Marland Kitchen esomeprazole (NEXIUM) 40 MG capsule   Oral   Take 40 mg by mouth 2 (two) times daily.          . furosemide (LASIX) 40 MG tablet   Oral   Take 40-80 mg by mouth daily.          . metoprolol (TOPROL-XL) 50 MG 24 hr tablet   Oral   Take 50 mg by mouth every morning.           . Multiple Vitamin (MULTIVITAMIN) tablet   Oral   Take 1 tablet by mouth daily.         . nitroGLYCERIN (NITROSTAT) 0.4 MG SL tablet   Sublingual   Place 0.4 mg under the tongue. Take as directed for chest pain          . ondansetron (ZOFRAN) 4 MG tablet   Oral   Take 4 mg by mouth. As  needed for nausea          . potassium chloride SA (K-DUR,KLOR-CON) 20 MEQ tablet   Oral   Take 20 mEq by mouth 2 (two) times daily.         . primidone (MYSOLINE) 50 MG tablet   Oral   Take 25 mg by mouth daily.          . simvastatin (ZOCOR) 20 MG tablet   Oral   Take 20 mg by mouth every evening.         Marland Kitchen VITAMIN D, CHOLECALCIFEROL, PO   Oral   Take by mouth as needed.          BP 172/100  Pulse 88  Temp(Src) 97.7 F (36.5 C) (Oral)  Resp 18  Ht 5\' 6"  (1.676 m)  Wt 158 lb (71.668 kg)  BMI 25.51 kg/m2  SpO2 98%  Physical Exam  Nursing note and vitals reviewed. Constitutional: She is oriented to person, place, and time. She appears well-developed and well-nourished. No distress.  HENT:  Head: Normocephalic and atraumatic.  Eyes: EOM are normal.  Neck: Neck supple. No tracheal deviation present.  Cardiovascular: Normal rate and intact distal pulses.   Pulmonary/Chest: Effort normal. No respiratory distress.  Musculoskeletal: Normal range of motion.  Normal thumb ROM.  0.5-cm superficial laceration to right thumb.  Radial pulse intact.   Pt states she is able to feel light touch to her thumb.  Neurological: She is alert and oriented to person, place, and time.  Skin: Skin is warm and dry.  Psychiatric: She has a normal mood and affect. Her behavior is normal.    ED Course  LACERATION REPAIR Date/Time: 05/25/2013 10:20 AM Performed by: Pricilla Loveless T Authorized by: Pricilla Loveless T Consent: Verbal consent obtained. Body area: upper extremity Location details: right thumb Laceration length: 0.5 cm Foreign bodies: no foreign bodies Tendon involvement: none Vascular damage: no Preparation:  Patient was prepped and draped in the usual sterile fashion. Irrigation solution: saline Irrigation method: syringe Amount of cleaning: standard Debridement: none Degree of undermining: none Skin closure: glue Approximation: close Approximation difficulty:  simple Patient tolerance: Patient tolerated the procedure well with no immediate complications.   (including critical care time)  DIAGNOSTIC STUDIES: Oxygen Saturation is 98% on room air, normal by my interpretation.    COORDINATION OF CARE: 9:34 AM-Discussed treatment plan which includes tetanus booster, wound care and imaging with pt at bedside and pt agreed to plan.    Labs Review Labs Reviewed - No data to display   Imaging Review Dg Finger Thumb Right  05/25/2013   CLINICAL DATA:  Traumatic injury and pain  EXAM: RIGHT THUMB 2+V  COMPARISON:  None.  FINDINGS: No acute fracture dislocation is identified. No gross soft tissue abnormality is seen.  IMPRESSION: No acute abnormality noted.   Electronically Signed   By: Alcide Clever M.D.   On: 05/25/2013 10:02     EKG Interpretation   None       MDM   1. Thumb injury, right, initial encounter   2. Thumb laceration, right, initial encounter    Patient with superficial laceration after a crush injury. She does have mild swelling but no fracture seen on x-ray. No deep laceration to suggest and nerve laceration to cause her vague "numbness". She can feel me with superficial touch and has normal movement of her. Thumb laceration closed with glue after irrigation. Tetanus updated. Discern because the "numbness" was from a crush injury or nerve damage or nerve stenting. Will give symptomatic care, return precautions, and hand follow up if her symptoms do not improve.    I personally performed the services described in this documentation, which was scribed in my presence. The recorded information has been reviewed and is accurate.    Katrina Camel, MD 05/25/13 1140

## 2013-05-25 NOTE — ED Notes (Signed)
Dermabond applied by EDP. Pt tolerated well. EDP give verbal order to let site dry then apply bandaid to site per pt request. Pt site dressed with telfa and secured with tape. nad noted.

## 2013-05-25 NOTE — ED Notes (Signed)
Right thumb cleaned with normal saline moistened gauze. Pt tolerated well.

## 2013-05-25 NOTE — ED Notes (Signed)
Pt reports was getting into her car this am and shut her right thumb in her car door. Pt reports is on blood thinner and was "bleeding a lot" at time of injury. No active bleeding noted at this time, small laceration noted to right thumb. No obvious deformity noted. Full ROM noted. Cap refil <3 secs.

## 2013-08-16 ENCOUNTER — Encounter: Payer: Self-pay | Admitting: Cardiology

## 2013-08-16 ENCOUNTER — Ambulatory Visit (INDEPENDENT_AMBULATORY_CARE_PROVIDER_SITE_OTHER): Payer: 59 | Admitting: Cardiology

## 2013-08-16 VITALS — BP 146/67 | HR 62 | Ht 66.5 in | Wt 158.0 lb

## 2013-08-16 DIAGNOSIS — I495 Sick sinus syndrome: Secondary | ICD-10-CM

## 2013-08-16 DIAGNOSIS — I10 Essential (primary) hypertension: Secondary | ICD-10-CM

## 2013-08-16 DIAGNOSIS — I4891 Unspecified atrial fibrillation: Secondary | ICD-10-CM

## 2013-08-16 DIAGNOSIS — I2510 Atherosclerotic heart disease of native coronary artery without angina pectoris: Secondary | ICD-10-CM

## 2013-08-16 NOTE — Assessment & Plan Note (Signed)
Symptomatically stable without active angina, history of DES to the RCA in 2010. Continue medical therapy and observation.

## 2013-08-16 NOTE — Progress Notes (Signed)
Clinical Summary [name] is a 78 y.o.female last seen in July 2014. She reports no progressive cardiac symptoms, specifically no recurring chest pain or palpitations. She has felt "weak" recently and has been under a significant amount of psychosocial stress.  ECG today shows an atrial paced rhythm. She reports compliance with her medications and has had follow with Dr. Nevada Crane.  Echocardiogram from May 2013 revealed moderate basal hypertrophy with LVEF 40-98%, grade 2 diastolic dysfunction, mild to moderate mitral regurgitation, moderate left atrial enlargement, device wire in right heart, mild tricuspid regurgitation, PASP 33 mm mercury, no pericardial effusion. Myoview in 2012 was negative for ischemia.  Routine lab work followed by Dr. Nevada Crane, she has had normal TSH and LFTs over time.   Allergies  Allergen Reactions  . Amitriptyline Hcl Other (See Comments)    Caused "jaws to twist and lock"  . Sulfonamide Derivatives     UNKNOWN REACTION    Current Outpatient Prescriptions  Medication Sig Dispense Refill  . ALPRAZolam (XANAX) 0.5 MG tablet Take 0.25-0.5 mg by mouth daily as needed. For anxiety      . amiodarone (PACERONE) 200 MG tablet Take 100 mg by mouth daily.      Marland Kitchen aspirin 325 MG EC tablet Take 325 mg by mouth daily.      . clopidogrel (PLAVIX) 75 MG tablet Take 75 mg by mouth every morning.      . Cyanocobalamin (B-12 PO) Take 1 tablet by mouth every other day.       . esomeprazole (NEXIUM) 40 MG capsule Take 40 mg by mouth daily.       . furosemide (LASIX) 40 MG tablet Take 40-80 mg by mouth daily. PT ALTERNATES DOSAGE DEPENDING ON SWELLING      . metoprolol (TOPROL-XL) 50 MG 24 hr tablet Take 50 mg by mouth every morning.        . Multiple Vitamin (MULTIVITAMIN) tablet Take 1 tablet by mouth daily.      . nitroGLYCERIN (NITROSTAT) 0.4 MG SL tablet Place 0.4 mg under the tongue. Take as directed for chest pain       . primidone (MYSOLINE) 50 MG tablet Take 50 mg by  mouth daily.       . simvastatin (ZOCOR) 20 MG tablet Take 20 mg by mouth every evening.      Marland Kitchen VITAMIN D, CHOLECALCIFEROL, PO Take by mouth as needed.      . potassium chloride SA (K-DUR,KLOR-CON) 20 MEQ tablet Take 20 mEq by mouth 2 (two) times daily. PT ALTERNATES WITH LASIX PRN INCREASE DOSAGE       No current facility-administered medications for this visit.    Past Medical History  Diagnosis Date  . Atrial fibrillation   . Coronary atherosclerosis of native coronary artery     DES x 2 to RCA 10/10  . GERD (gastroesophageal reflux disease)   . Tachycardia-bradycardia syndrome     s/p Medtronic Adapta L dual chamber device  5/10  . Hyperlipidemia   . COPD (chronic obstructive pulmonary disease)   . Anxiety   . Resting tremor   . 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 Chronic back pain   . Pericardial effusion     Hemorrhagic   . Essential hypertension, benign   . Headache(784.0)   . H/O hiatal hernia   . Neuromuscular disorder     Tremors    Social History Ms. Givhan reports that she has never smoked. She  has never used smokeless tobacco. Ms. Doutt reports that she does not drink alcohol.  Review of Systems No palpitations or syncope. No orthopnea or PND. Some increased abdominal girth but no peripheral edema. Otherwise negative.  Physical Examination Filed Vitals:   08/16/13 0843  BP: 146/67  Pulse: 62   Filed Weights   08/16/13 0843  Weight: 158 lb (71.668 kg)    Chronically ill-appearing woman, comfortable at rest.  HEENT: Conjunctiva and lids are normal, oropharynx clear.  Neck: Supple, no JVD, no bruits.  Lungs: Clear to auscultation with diminished breath sounds mainly at bases.  Cardiac: Regular rate and rhythm without S3 gallop. No pericardial rub or knock.  Abdomen: Soft, nontender, bowel sounds present  Extremities: Trace edema. Skin: Warm and dry. Musculoskeletal: Kyphosis noted. Neuropsychiatric: Alert and oriented x3,  resting tremor.   Problem List and Plan   CORONARY ATHEROSCLEROSIS NATIVE CORONARY ARTERY Symptomatically stable without active angina, history of DES to the RCA in 2010. Continue medical therapy and observation.  Atrial fibrillation Has been well controlled on amiodarone and Toprol-XL, not anticoagulated with history of hemorrhagic pericardial effusion. She is on aspirin and Plavix.  BRADYCARDIA-TACHYCARDIA SYNDROME Atrial pacing today, Medtronic device followed by Dr. Lovena Le.    Satira Sark, M.D., F.A.C.C.

## 2013-08-16 NOTE — Assessment & Plan Note (Signed)
Has been well controlled on amiodarone and Toprol-XL, not anticoagulated with history of hemorrhagic pericardial effusion. She is on aspirin and Plavix.

## 2013-08-16 NOTE — Patient Instructions (Addendum)
Your physician recommends that you schedule a follow-up appointment in: 6 months  Your physician recommends that you continue on your current medications as directed. Please refer to the Current Medication list given to you today.  

## 2013-08-16 NOTE — Assessment & Plan Note (Signed)
Atrial pacing today, Medtronic device followed by Dr. Lovena Le.

## 2013-08-29 ENCOUNTER — Encounter: Payer: Medicare Other | Admitting: Internal Medicine

## 2013-09-02 ENCOUNTER — Encounter: Payer: Self-pay | Admitting: Internal Medicine

## 2013-09-02 ENCOUNTER — Ambulatory Visit (INDEPENDENT_AMBULATORY_CARE_PROVIDER_SITE_OTHER): Payer: 59 | Admitting: Internal Medicine

## 2013-09-02 VITALS — BP 148/72 | HR 69 | Ht 66.5 in | Wt 163.0 lb

## 2013-09-02 DIAGNOSIS — Z950 Presence of cardiac pacemaker: Secondary | ICD-10-CM

## 2013-09-02 DIAGNOSIS — I495 Sick sinus syndrome: Secondary | ICD-10-CM

## 2013-09-02 DIAGNOSIS — I4891 Unspecified atrial fibrillation: Secondary | ICD-10-CM

## 2013-09-02 DIAGNOSIS — I10 Essential (primary) hypertension: Secondary | ICD-10-CM

## 2013-09-02 LAB — MDC_IDC_ENUM_SESS_TYPE_INCLINIC
Brady Statistic AP VS Percent: 83 %
Brady Statistic AS VS Percent: 17 %
Date Time Interrogation Session: 20150130120245
Lead Channel Impedance Value: 613 Ohm
Lead Channel Pacing Threshold Amplitude: 0.5 V
Lead Channel Pacing Threshold Pulse Width: 0.4 ms
Lead Channel Pacing Threshold Pulse Width: 0.4 ms
Lead Channel Sensing Intrinsic Amplitude: 11.2 mV
Lead Channel Setting Pacing Amplitude: 2 V
Lead Channel Setting Sensing Sensitivity: 2.8 mV
MDC IDC MSMT BATTERY IMPEDANCE: 227 Ohm
MDC IDC MSMT BATTERY REMAINING LONGEVITY: 116 mo
MDC IDC MSMT BATTERY VOLTAGE: 2.8 V
MDC IDC MSMT LEADCHNL RA IMPEDANCE VALUE: 398 Ohm
MDC IDC MSMT LEADCHNL RA PACING THRESHOLD AMPLITUDE: 0.5 V
MDC IDC MSMT LEADCHNL RA SENSING INTR AMPL: 1 mV
MDC IDC SET LEADCHNL RV PACING AMPLITUDE: 2.5 V
MDC IDC SET LEADCHNL RV PACING PULSEWIDTH: 0.4 ms
MDC IDC STAT BRADY AP VP PERCENT: 0 %
MDC IDC STAT BRADY AS VP PERCENT: 0 %

## 2013-09-02 NOTE — Patient Instructions (Signed)
Your physician wants you to follow-up in: 6 months with [name] and 1 year with [name] Saliva will receive a reminder letter in the mail two months in advance. If you don't receive a letter, please call our office to schedule the follow-up appointment.

## 2013-09-02 NOTE — Assessment & Plan Note (Signed)
Her Medtronic dual-chamber pacemaker is working normally and has approximately 9 years of battery longevity. We'll plan to recheck in several months.

## 2013-09-02 NOTE — Assessment & Plan Note (Signed)
She is maintaining sinus rhythm very nicely. She will continue her current medical therapy. She continues to refuse anticoagulation.

## 2013-09-02 NOTE — Assessment & Plan Note (Signed)
Her blood pressure is well controlled. No change in medical therapy.

## 2013-09-02 NOTE — Progress Notes (Signed)
HPI [name] returns today for followup. She is a 78 year old woman with a history of tachybrady syndrome, coronary disease, status post permanent pacemaker insertion. The patient is not thought to be a Coumadin candidate, particularly in light of her need for aspirin and Plavix. In the interim, she denies chest pain. She has minimal palpitation. No shortness of breath. No syncope. Allergies  Allergen Reactions  . Amitriptyline Hcl Other (See Comments)    Caused "jaws to twist and lock"  . Sulfonamide Derivatives     UNKNOWN REACTION     Current Outpatient Prescriptions  Medication Sig Dispense Refill  . ALPRAZolam (XANAX) 0.5 MG tablet Take 0.25-0.5 mg by mouth daily as needed. For anxiety      . amiodarone (PACERONE) 200 MG tablet Take 100 mg by mouth daily.      Marland Kitchen aspirin 325 MG EC tablet Take 325 mg by mouth daily.      . clopidogrel (PLAVIX) 75 MG tablet Take 75 mg by mouth every morning.      . Cyanocobalamin (B-12 PO) Take 1 tablet by mouth every other day.       . esomeprazole (NEXIUM) 40 MG capsule Take 40 mg by mouth daily.       . furosemide (LASIX) 40 MG tablet Take 40-80 mg by mouth daily. PT ALTERNATES DOSAGE DEPENDING ON SWELLING      . metoprolol (TOPROL-XL) 50 MG 24 hr tablet Take 50 mg by mouth every morning.        . Multiple Vitamin (MULTIVITAMIN) tablet Take 1 tablet by mouth daily.      . nitroGLYCERIN (NITROSTAT) 0.4 MG SL tablet Place 0.4 mg under the tongue. Take as directed for chest pain       . potassium chloride SA (K-DUR,KLOR-CON) 20 MEQ tablet Take 20 mEq by mouth 2 (two) times daily. PT ALTERNATES WITH LASIX PRN INCREASE DOSAGE      . primidone (MYSOLINE) 50 MG tablet Take 50 mg by mouth daily.       . simvastatin (ZOCOR) 20 MG tablet Take 20 mg by mouth every evening.      Marland Kitchen VITAMIN D, CHOLECALCIFEROL, PO Take by mouth as needed.       No current facility-administered medications for this visit.     Past Medical History  Diagnosis Date  . Atrial  fibrillation   . Coronary atherosclerosis of native coronary artery     DES x 2 to RCA 10/10  . GERD (gastroesophageal reflux disease)   . Tachycardia-bradycardia syndrome     s/p Medtronic Adapta L dual chamber device  5/10  . Hyperlipidemia   . COPD (chronic obstructive pulmonary disease)   . Anxiety   . Resting tremor   . 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 Chronic back pain   . Pericardial effusion     Hemorrhagic   . Essential hypertension, benign   . Headache(784.0)   . H/O hiatal hernia   . Neuromuscular disorder     Tremors    ROS:   All systems reviewed and negative except as noted in the HPI.   Past Surgical History  Procedure Laterality Date  . Cholecystectomy    . Appendectomy    . Subxiphoid pericardial window  11/10  . Esophagogastroduodenoscopy with esophageal dilation  2004, 2006, 2007  . Vaginal hysterectomy    . Colonoscopy  2011  . Right rotator cuff repair    . Insert / replace / remove pacemaker  Family History  Problem Relation Age of Onset  . Cancer Mother     Colon   . Coronary artery disease Sister   . Coronary artery disease Brother   . Arthritis    . Lung disease    . Asthma       History   Social History  . Marital Status: Married    Spouse Name: [name]    Number of Children: [name]  . Years of Education: [name]   Occupational History  . Retired    Social History Main Topics  . Smoking status: Never Smoker   . Smokeless tobacco: Never Used  . Alcohol Use: No  . Drug Use: No  . Sexual Activity: Not on file   Other Topics Concern  . Not on file   Social History Narrative   She lives alone, has adopted daughter.     BP 148/72  Pulse 69  Ht 5' 6.5" (1.689 m)  Wt 163 lb (73.936 kg)  BMI 25.92 kg/m2  Physical Exam:  Well appearing 78 year old woman, NAD HEENT: Unremarkable Neck:  No JVD, no thyromegally Lungs:  Clear with no wheezes, rales, or rhonchi. Well-healed pacemaker  incision. HEART:  Regular rate rhythm, no murmurs, no rubs, no clicks Abd:  soft, positive bowel sounds, no organomegally, no rebound, no guarding Ext:  2 plus pulses, no edema, no cyanosis, no clubbing Skin:  No rashes no nodules Neuro:  CN II through XII intact, motor grossly intact  DEVICE  Normal device function.  See PaceArt for details.   Assess/Plan:

## 2014-01-04 ENCOUNTER — Ambulatory Visit (INDEPENDENT_AMBULATORY_CARE_PROVIDER_SITE_OTHER): Payer: 59 | Admitting: Internal Medicine

## 2014-01-06 ENCOUNTER — Encounter (INDEPENDENT_AMBULATORY_CARE_PROVIDER_SITE_OTHER): Payer: Self-pay | Admitting: *Deleted

## 2014-02-06 ENCOUNTER — Ambulatory Visit (INDEPENDENT_AMBULATORY_CARE_PROVIDER_SITE_OTHER): Payer: 59 | Admitting: Internal Medicine

## 2014-02-09 ENCOUNTER — Telehealth (INDEPENDENT_AMBULATORY_CARE_PROVIDER_SITE_OTHER): Payer: Self-pay | Admitting: *Deleted

## 2014-02-09 NOTE — Telephone Encounter (Signed)
[name] called and cancelled her apt on 02/06/14 because she hurt her back and is not able to get out. She will call back to reschedule when she gets to feeling better.

## 2014-03-17 ENCOUNTER — Encounter: Payer: Self-pay | Admitting: *Deleted

## 2014-03-29 ENCOUNTER — Ambulatory Visit: Payer: 59 | Admitting: Cardiology

## 2014-05-05 ENCOUNTER — Encounter: Payer: Self-pay | Admitting: Internal Medicine

## 2014-05-05 ENCOUNTER — Ambulatory Visit (INDEPENDENT_AMBULATORY_CARE_PROVIDER_SITE_OTHER): Payer: 59 | Admitting: *Deleted

## 2014-05-05 DIAGNOSIS — I495 Sick sinus syndrome: Secondary | ICD-10-CM

## 2014-05-05 LAB — MDC_IDC_ENUM_SESS_TYPE_INCLINIC
Battery Impedance: 299 Ohm
Battery Voltage: 2.8 V
Brady Statistic AP VP Percent: 0 %
Brady Statistic AP VS Percent: 89 %
Date Time Interrogation Session: 20151002135142
Lead Channel Impedance Value: 436 Ohm
Lead Channel Impedance Value: 571 Ohm
Lead Channel Pacing Threshold Amplitude: 0.5 V
Lead Channel Pacing Threshold Pulse Width: 0.4 ms
Lead Channel Pacing Threshold Pulse Width: 0.4 ms
Lead Channel Sensing Intrinsic Amplitude: 1 mV
Lead Channel Setting Pacing Amplitude: 2.5 V
Lead Channel Setting Sensing Sensitivity: 4 mV
MDC IDC MSMT BATTERY REMAINING LONGEVITY: 106 mo
MDC IDC MSMT LEADCHNL RV PACING THRESHOLD AMPLITUDE: 0.5 V
MDC IDC MSMT LEADCHNL RV SENSING INTR AMPL: 8 mV
MDC IDC SET LEADCHNL RA PACING AMPLITUDE: 2 V
MDC IDC SET LEADCHNL RV PACING PULSEWIDTH: 0.4 ms
MDC IDC STAT BRADY AS VP PERCENT: 0 %
MDC IDC STAT BRADY AS VS PERCENT: 11 %

## 2014-05-05 NOTE — Progress Notes (Signed)
PPM check in office. 

## 2014-05-08 ENCOUNTER — Encounter: Payer: Self-pay | Admitting: Cardiology

## 2014-05-08 ENCOUNTER — Ambulatory Visit (INDEPENDENT_AMBULATORY_CARE_PROVIDER_SITE_OTHER): Payer: 59 | Admitting: Cardiology

## 2014-05-08 VITALS — BP 154/86 | HR 80 | Ht 65.0 in | Wt 163.0 lb

## 2014-05-08 DIAGNOSIS — I480 Paroxysmal atrial fibrillation: Secondary | ICD-10-CM

## 2014-05-08 DIAGNOSIS — I10 Essential (primary) hypertension: Secondary | ICD-10-CM

## 2014-05-08 DIAGNOSIS — I2510 Atherosclerotic heart disease of native coronary artery without angina pectoris: Secondary | ICD-10-CM

## 2014-05-08 DIAGNOSIS — I495 Sick sinus syndrome: Secondary | ICD-10-CM

## 2014-05-08 NOTE — Assessment & Plan Note (Signed)
No active angina symptoms on medical therapy. Continue observation.

## 2014-05-08 NOTE — Patient Instructions (Signed)
Your physician wants you to follow-up in: 6 months You will receive a reminder letter in the mail two months in advance. If you don't receive a letter, please call our office to schedule the follow-up appointment.     Your physician recommends that you continue on your current medications as directed. Please refer to the Current Medication list given to you today.      Thank you for choosing [facility] !        

## 2014-05-08 NOTE — Assessment & Plan Note (Signed)
No change in current medication, keep followup with Dr. Nevada Crane.

## 2014-05-08 NOTE — Assessment & Plan Note (Signed)
Status post Medtronic pacemaker, followed by Dr. Lovena Le.

## 2014-05-08 NOTE — Progress Notes (Signed)
Clinical Summary Ms. Spiker is a 78 y.o.female last seen in January. Followup in the device clinic noted. She continues to see Dr. Nevada Crane, reports having recent lab work within the last few months. She is not reporting any active angina symptoms. No palpitations or syncope. States that she is caring for her invalid daughter.  We reviewed her cardiac medications. She reports compliance.  Echocardiogram from May 2013 revealed moderate basal hypertrophy with LVEF 57-84%, grade 2 diastolic dysfunction, mild to moderate mitral regurgitation, moderate left atrial enlargement, device wire in right heart, mild tricuspid regurgitation, PASP 33 mm mercury, no pericardial effusion. Myoview in 2012 was negative for ischemia.   Allergies  Allergen Reactions  . Amitriptyline Hcl Other (See Comments)    Caused "jaws to twist and lock"  . Sulfonamide Derivatives     UNKNOWN REACTION    Current Outpatient Prescriptions  Medication Sig Dispense Refill  . ALPRAZolam (XANAX) 0.5 MG tablet Take 0.25-0.5 mg by mouth daily as needed. For anxiety      . amiodarone (PACERONE) 200 MG tablet Take 100 mg by mouth daily.      Marland Kitchen aspirin 325 MG EC tablet Take 325 mg by mouth daily.      . clopidogrel (PLAVIX) 75 MG tablet Take 75 mg by mouth every morning.      . Cyanocobalamin (B-12 PO) Take 1 tablet by mouth every other day.       . esomeprazole (NEXIUM) 40 MG capsule Take 40 mg by mouth daily.       . furosemide (LASIX) 40 MG tablet Take 40-80 mg by mouth daily. PT ALTERNATES DOSAGE DEPENDING ON SWELLING      . metoprolol (TOPROL-XL) 50 MG 24 hr tablet Take 50 mg by mouth every morning.        . Multiple Vitamin (MULTIVITAMIN) tablet Take 1 tablet by mouth daily.      . nitroGLYCERIN (NITROSTAT) 0.4 MG SL tablet Place 0.4 mg under the tongue. Take as directed for chest pain       . potassium chloride SA (K-DUR,KLOR-CON) 20 MEQ tablet Take 20 mEq by mouth 2 (two) times daily. PT ALTERNATES WITH LASIX PRN INCREASE  DOSAGE      . primidone (MYSOLINE) 50 MG tablet Take 50 mg by mouth daily.       . simvastatin (ZOCOR) 20 MG tablet Take 20 mg by mouth every evening.      Marland Kitchen VITAMIN D, CHOLECALCIFEROL, PO Take by mouth as needed.       No current facility-administered medications for this visit.    Past Medical History  Diagnosis Date  . Atrial fibrillation   . Coronary atherosclerosis of native coronary artery     DES x 2 to RCA 10/10  . GERD (gastroesophageal reflux disease)   . Tachycardia-bradycardia syndrome     s/p Medtronic Adapta L dual chamber device  5/10  . Hyperlipidemia   . COPD (chronic obstructive pulmonary disease)   . Anxiety   . Resting tremor   . 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 Chronic back pain   . Pericardial effusion     Hemorrhagic   . Essential hypertension, benign   . Headache(784.0)   . H/O hiatal hernia   . Neuromuscular disorder     Tremors    Social History Ms. Franklyn reports that she has never smoked. She has never used smokeless tobacco. Ms. Debnam reports that she does not drink alcohol.  Review of Systems Reports intermittent feeling of abdominal fullness, improved with diuretic. No orthopnea or PND. No reported major bleeding problems, bruises easily. Other systems reviewed and negative except as outlined.  Physical Examination Filed Vitals:   05/08/14 1050  BP: 154/86  Pulse: 80   Filed Weights   05/08/14 1050  Weight: 163 lb (73.936 kg)    Chronically ill-appearing woman, comfortable at rest.  HEENT: Conjunctiva and lids are normal, oropharynx clear.  Neck: Supple, no JVD, no bruits.  Lungs: Clear to auscultation with diminished breath sounds mainly at bases.  Cardiac: Regular rate and rhythm without S3 gallop. No pericardial rub or knock.  Abdomen: Soft, nontender, bowel sounds present  Extremities: Trace edema.  Skin: Warm and dry.  Musculoskeletal: Kyphosis noted.  Neuropsychiatric: Alert and oriented x3,  resting tremor.   Problem List and Plan   Atrial fibrillation Has maintained sinus rhythm fairly well. She is not on anticoagulant with history of hemorrhagic pericardial effusion. Has been able to tolerate aspirin and Plavix.  BRADYCARDIA-TACHYCARDIA SYNDROME Status post Medtronic pacemaker, followed by Dr. Lovena Le.  CORONARY ATHEROSCLEROSIS NATIVE CORONARY ARTERY No active angina symptoms on medical therapy. Continue observation.  Essential hypertension, benign No change in current medication, keep followup with Dr. Nevada Crane.    Satira Sark, M.D., F.A.C.C.

## 2014-05-08 NOTE — Assessment & Plan Note (Signed)
Has maintained sinus rhythm fairly well. She is not on anticoagulant with history of hemorrhagic pericardial effusion. Has been able to tolerate aspirin and Plavix.

## 2014-05-17 ENCOUNTER — Telehealth: Payer: Self-pay | Admitting: *Deleted

## 2014-05-17 MED ORDER — AMIODARONE HCL 100 MG PO TABS: 100.0000 mg | ORAL_TABLET | Freq: Every day | ORAL | Status: DC

## 2014-05-17 NOTE — Telephone Encounter (Signed)
Pt wanted 100 mg of amiodarone sent to [facility]. Medication sent to pharmacy.

## 2014-05-17 NOTE — Telephone Encounter (Signed)
amiodarone (PACERONE) 200 MG tablet PT IS CONFUSED ON WHAT HER DOSE SHOULD BE

## 2014-05-17 NOTE — Telephone Encounter (Signed)
Medication sent to pharmacy  

## 2014-06-15 DIAGNOSIS — H04553 Acquired stenosis of bilateral nasolacrimal duct: Secondary | ICD-10-CM | POA: Insufficient documentation

## 2014-07-13 ENCOUNTER — Encounter (HOSPITAL_COMMUNITY): Payer: Self-pay | Admitting: Cardiovascular Disease

## 2014-08-09 DIAGNOSIS — I509 Heart failure, unspecified: Secondary | ICD-10-CM | POA: Diagnosis not present

## 2014-08-11 DIAGNOSIS — I10 Essential (primary) hypertension: Secondary | ICD-10-CM | POA: Diagnosis not present

## 2014-08-17 DIAGNOSIS — I2510 Atherosclerotic heart disease of native coronary artery without angina pectoris: Secondary | ICD-10-CM | POA: Diagnosis not present

## 2014-08-17 DIAGNOSIS — I495 Sick sinus syndrome: Secondary | ICD-10-CM | POA: Diagnosis not present

## 2014-08-17 DIAGNOSIS — I10 Essential (primary) hypertension: Secondary | ICD-10-CM | POA: Diagnosis not present

## 2014-08-23 ENCOUNTER — Emergency Department (HOSPITAL_COMMUNITY): Payer: Medicare Other

## 2014-08-23 ENCOUNTER — Observation Stay (HOSPITAL_COMMUNITY)
Admission: EM | Admit: 2014-08-23 | Discharge: 2014-08-25 | Disposition: A | Discharge status: Home / Self Care | Payer: Medicare Other | Attending: Family Medicine | Admitting: Family Medicine

## 2014-08-23 ENCOUNTER — Encounter (HOSPITAL_COMMUNITY): Payer: Self-pay | Admitting: Emergency Medicine

## 2014-08-23 DIAGNOSIS — Z79899 Other long term (current) drug therapy: Secondary | ICD-10-CM | POA: Diagnosis not present

## 2014-08-23 DIAGNOSIS — M549 Dorsalgia, unspecified: Secondary | ICD-10-CM | POA: Diagnosis not present

## 2014-08-23 DIAGNOSIS — E785 Hyperlipidemia, unspecified: Secondary | ICD-10-CM | POA: Diagnosis not present

## 2014-08-23 DIAGNOSIS — G8929 Other chronic pain: Secondary | ICD-10-CM | POA: Insufficient documentation

## 2014-08-23 DIAGNOSIS — I319 Disease of pericardium, unspecified: Secondary | ICD-10-CM | POA: Insufficient documentation

## 2014-08-23 DIAGNOSIS — I5032 Chronic diastolic (congestive) heart failure: Secondary | ICD-10-CM | POA: Diagnosis present

## 2014-08-23 DIAGNOSIS — R0602 Shortness of breath: Secondary | ICD-10-CM | POA: Insufficient documentation

## 2014-08-23 DIAGNOSIS — Z7982 Long term (current) use of aspirin: Secondary | ICD-10-CM | POA: Insufficient documentation

## 2014-08-23 DIAGNOSIS — R0789 Other chest pain: Secondary | ICD-10-CM | POA: Diagnosis not present

## 2014-08-23 DIAGNOSIS — R079 Chest pain, unspecified: Secondary | ICD-10-CM | POA: Insufficient documentation

## 2014-08-23 DIAGNOSIS — R259 Unspecified abnormal involuntary movements: Secondary | ICD-10-CM | POA: Diagnosis not present

## 2014-08-23 DIAGNOSIS — J449 Chronic obstructive pulmonary disease, unspecified: Secondary | ICD-10-CM | POA: Insufficient documentation

## 2014-08-23 DIAGNOSIS — G709 Myoneural disorder, unspecified: Secondary | ICD-10-CM | POA: Diagnosis not present

## 2014-08-23 DIAGNOSIS — I4891 Unspecified atrial fibrillation: Secondary | ICD-10-CM | POA: Diagnosis not present

## 2014-08-23 DIAGNOSIS — I4819 Other persistent atrial fibrillation: Secondary | ICD-10-CM | POA: Diagnosis present

## 2014-08-23 DIAGNOSIS — R072 Precordial pain: Secondary | ICD-10-CM

## 2014-08-23 DIAGNOSIS — I482 Chronic atrial fibrillation: Secondary | ICD-10-CM

## 2014-08-23 DIAGNOSIS — R109 Unspecified abdominal pain: Secondary | ICD-10-CM | POA: Diagnosis not present

## 2014-08-23 DIAGNOSIS — K219 Gastro-esophageal reflux disease without esophagitis: Secondary | ICD-10-CM | POA: Diagnosis present

## 2014-08-23 DIAGNOSIS — I2510 Atherosclerotic heart disease of native coronary artery without angina pectoris: Secondary | ICD-10-CM | POA: Diagnosis not present

## 2014-08-23 DIAGNOSIS — I10 Essential (primary) hypertension: Secondary | ICD-10-CM | POA: Diagnosis present

## 2014-08-23 DIAGNOSIS — I495 Sick sinus syndrome: Secondary | ICD-10-CM | POA: Diagnosis not present

## 2014-08-23 DIAGNOSIS — F411 Generalized anxiety disorder: Secondary | ICD-10-CM | POA: Diagnosis present

## 2014-08-23 DIAGNOSIS — Z7902 Long term (current) use of antithrombotics/antiplatelets: Secondary | ICD-10-CM | POA: Insufficient documentation

## 2014-08-23 DIAGNOSIS — J9811 Atelectasis: Secondary | ICD-10-CM | POA: Diagnosis not present

## 2014-08-23 DIAGNOSIS — F419 Anxiety disorder, unspecified: Secondary | ICD-10-CM | POA: Diagnosis not present

## 2014-08-23 LAB — BASIC METABOLIC PANEL
Anion gap: 9 (ref 5–15)
BUN: 15 mg/dL (ref 6–23)
CALCIUM: 9.8 mg/dL (ref 8.4–10.5)
CHLORIDE: 104 meq/L (ref 96–112)
CO2: 26 mmol/L (ref 19–32)
CREATININE: 0.79 mg/dL (ref 0.50–1.10)
GFR calc Af Amer: >90 mL/min (ref >90)
GFR calc non Af Amer: 79 mL/min — ABNORMAL LOW (ref >90)
GLUCOSE: 104 mg/dL — AB (ref 70–99)
Potassium: 3.8 mmol/L (ref 3.5–5.1)
Sodium: 139 mmol/L (ref 135–145)

## 2014-08-23 LAB — CBC WITH DIFFERENTIAL/PLATELET
Basophils Absolute: 0 10*3/uL (ref 0.0–0.1)
Basophils Relative: 1 % (ref 0–1)
EOS ABS: 0.1 10*3/uL (ref 0.0–0.7)
Eosinophils Relative: 1 % (ref 0–5)
HCT: 38.4 % (ref 36.0–46.0)
Hemoglobin: 12.3 g/dL (ref 12.0–15.0)
Lymphocytes Relative: 35 % (ref 12–46)
Lymphs Abs: 2 10*3/uL (ref 0.7–4.0)
MCH: 29.9 pg (ref 26.0–34.0)
MCHC: 32 g/dL (ref 30.0–36.0)
MCV: 93.2 fL (ref 78.0–100.0)
Monocytes Absolute: 0.6 10*3/uL (ref 0.1–1.0)
Monocytes Relative: 10 % (ref 3–12)
NEUTROS PCT: 53 % (ref 43–77)
Neutro Abs: 3.1 10*3/uL (ref 1.7–7.7)
Platelets: 199 10*3/uL (ref 150–400)
RBC: 4.12 MIL/uL (ref 3.87–5.11)
RDW: 13.8 % (ref 11.5–15.5)
WBC: 5.7 10*3/uL (ref 4.0–10.5)

## 2014-08-23 LAB — TROPONIN I
TROPONIN I: 0.03 ng/mL (ref <0.031)
Troponin I: 0.03 ng/mL (ref <0.031)

## 2014-08-23 LAB — HEPATIC FUNCTION PANEL
ALBUMIN: 4 g/dL (ref 3.5–5.2)
ALK PHOS: 82 U/L (ref 39–117)
ALT: 20 U/L (ref 0–35)
AST: 23 U/L (ref 0–37)
BILIRUBIN DIRECT: 0.1 mg/dL (ref 0.0–0.3)
BILIRUBIN TOTAL: 0.5 mg/dL (ref 0.3–1.2)
Indirect Bilirubin: 0.4 mg/dL (ref 0.3–0.9)
Total Protein: 7.6 g/dL (ref 6.0–8.3)

## 2014-08-23 LAB — LIPASE, BLOOD: LIPASE: 35 U/L (ref 11–59)

## 2014-08-23 MED ORDER — ONDANSETRON HCL 4 MG/2ML IJ SOLN: 4.0000 mg | Freq: Four times a day (QID) | INTRAMUSCULAR | Status: DC | PRN

## 2014-08-23 MED ORDER — ASPIRIN EC 81 MG PO TBEC
81.0000 mg | DELAYED_RELEASE_TABLET | Freq: Every day | ORAL | Status: DC
  Administered 2014-08-24 – 2014-08-25 (×2): 81 mg via ORAL
  Filled 2014-08-23 (×2): qty 1

## 2014-08-23 MED ORDER — ACETAMINOPHEN 325 MG PO TABS
650.0000 mg | ORAL_TABLET | ORAL | Status: DC | PRN
  Administered 2014-08-24: 650 mg via ORAL
  Filled 2014-08-23: qty 2

## 2014-08-23 MED ORDER — NITROGLYCERIN 2 % TD OINT
1.0000 [in_us] | TOPICAL_OINTMENT | Freq: Four times a day (QID) | TRANSDERMAL | Status: DC
  Administered 2014-08-23 – 2014-08-24 (×3): 1 [in_us] via TOPICAL
  Filled 2014-08-23 (×3): qty 1

## 2014-08-23 MED ORDER — SODIUM CHLORIDE 0.9 % IJ SOLN
3.0000 mL | Freq: Two times a day (BID) | INTRAMUSCULAR | Status: DC
  Administered 2014-08-24 – 2014-08-25 (×4): 3 mL via INTRAVENOUS

## 2014-08-23 MED ORDER — ENALAPRILAT 1.25 MG/ML IV SOLN
1.2500 mg | Freq: Once | INTRAVENOUS | Status: AC
  Administered 2014-08-23: 1.25 mg via INTRAVENOUS
  Filled 2014-08-23: qty 2

## 2014-08-23 MED ORDER — HEPARIN SODIUM (PORCINE) 5000 UNIT/ML IJ SOLN
5000.0000 [IU] | Freq: Three times a day (TID) | INTRAMUSCULAR | Status: DC
  Administered 2014-08-24 – 2014-08-25 (×5): 5000 [IU] via SUBCUTANEOUS
  Filled 2014-08-23 (×5): qty 1

## 2014-08-23 MED ORDER — MORPHINE SULFATE 2 MG/ML IJ SOLN
2.0000 mg | INTRAMUSCULAR | Status: DC | PRN
  Administered 2014-08-24: 2 mg via INTRAVENOUS
  Filled 2014-08-23: qty 1

## 2014-08-23 MED ORDER — CLOPIDOGREL BISULFATE 75 MG PO TABS: 75.0000 mg | ORAL_TABLET | ORAL | Status: DC

## 2014-08-23 MED ORDER — MORPHINE SULFATE 2 MG/ML IJ SOLN
2.0000 mg | INTRAMUSCULAR | Status: DC | PRN
  Administered 2014-08-23 (×2): 2 mg via INTRAVENOUS
  Filled 2014-08-23 (×2): qty 1

## 2014-08-23 MED ORDER — IRBESARTAN 75 MG PO TABS
75.0000 mg | ORAL_TABLET | Freq: Every day | ORAL | Status: DC
  Administered 2014-08-24 – 2014-08-25 (×2): 75 mg via ORAL
  Filled 2014-08-23 (×2): qty 1

## 2014-08-23 MED ORDER — ALPRAZOLAM 1 MG PO TABS
1.0000 mg | ORAL_TABLET | Freq: Every day | ORAL | Status: DC
  Administered 2014-08-24 (×2): 1 mg via ORAL
  Filled 2014-08-23 (×2): qty 1

## 2014-08-23 MED ORDER — SIMVASTATIN 10 MG PO TABS
5.0000 mg | ORAL_TABLET | Freq: Every day | ORAL | Status: DC
  Administered 2014-08-24 (×2): 5 mg via ORAL
  Filled 2014-08-23 (×2): qty 1

## 2014-08-23 MED ORDER — ONDANSETRON HCL 4 MG/2ML IJ SOLN
4.0000 mg | Freq: Once | INTRAMUSCULAR | Status: AC
  Administered 2014-08-23: 4 mg via INTRAVENOUS
  Filled 2014-08-23: qty 2

## 2014-08-23 MED ORDER — FUROSEMIDE 40 MG PO TABS
60.0000 mg | ORAL_TABLET | Freq: Every day | ORAL | Status: DC
  Administered 2014-08-24: 40 mg via ORAL
  Administered 2014-08-24: 20 mg via ORAL
  Administered 2014-08-25: 60 mg via ORAL
  Filled 2014-08-23 (×4): qty 1

## 2014-08-23 MED ORDER — POTASSIUM CHLORIDE CRYS ER 20 MEQ PO TBCR
20.0000 meq | EXTENDED_RELEASE_TABLET | Freq: Every day | ORAL | Status: DC
  Administered 2014-08-24 – 2014-08-25 (×2): 20 meq via ORAL
  Filled 2014-08-23 (×2): qty 1

## 2014-08-23 MED ORDER — GI COCKTAIL ~~LOC~~: 30.0000 mL | Freq: Four times a day (QID) | ORAL | Status: DC | PRN

## 2014-08-23 MED ORDER — PANTOPRAZOLE SODIUM 40 MG PO TBEC
80.0000 mg | DELAYED_RELEASE_TABLET | Freq: Every day | ORAL | Status: DC
  Administered 2014-08-24: 40 mg via ORAL
  Administered 2014-08-25: 80 mg via ORAL
  Filled 2014-08-23 (×2): qty 2

## 2014-08-23 MED ORDER — METOPROLOL SUCCINATE ER 50 MG PO TB24
75.0000 mg | ORAL_TABLET | Freq: Every day | ORAL | Status: DC
  Administered 2014-08-24: 25 mg via ORAL
  Administered 2014-08-24: 50 mg via ORAL
  Administered 2014-08-25: 75 mg via ORAL
  Filled 2014-08-23 (×4): qty 1

## 2014-08-23 MED ORDER — CLOPIDOGREL BISULFATE 75 MG PO TABS
75.0000 mg | ORAL_TABLET | Freq: Every day | ORAL | Status: DC
  Administered 2014-08-24 – 2014-08-25 (×2): 75 mg via ORAL
  Filled 2014-08-23 (×2): qty 1

## 2014-08-23 MED ORDER — AMIODARONE HCL 200 MG PO TABS
100.0000 mg | ORAL_TABLET | Freq: Every day | ORAL | Status: DC
  Administered 2014-08-24 – 2014-08-25 (×2): 100 mg via ORAL
  Filled 2014-08-23 (×4): qty 1

## 2014-08-23 MED ORDER — PRIMIDONE 50 MG PO TABS
50.0000 mg | ORAL_TABLET | Freq: Every day | ORAL | Status: DC
  Administered 2014-08-24: 50 mg via ORAL
  Filled 2014-08-23 (×5): qty 1

## 2014-08-23 NOTE — ED Provider Notes (Signed)
CSN: [id number]     Arrival date & time 08/23/14  1624 History  This chart was scribed for Tanna Furry, MD by Tula Nakayama, ED Scribe. This patient was seen in room APA02/APA02 and the patient's care was started at 4:54 PM.    Chief Complaint  Patient presents with  . Chest Pain    The history is provided by the patient. No language interpreter was used.    HPI Comments: [name] is a 79 y.o. female [place] who presents to the Emergency Department complaining of 1 episode of constant, gradually improving left-sided chest pain that started 2 hours ago. Pt reports SOB during the episode as an associated symptom. She states pain started in her upper lumbar spine and moved to her chest during the day. Pt was standing at home at the onset of CP and reports she just came back from the store and used the bathroom. She has tried rest and NTG with some relief. Pt states an intermittent history of similar back pain that is unchanged today. Pt also has a history of cardiac problems and sees cardiologist biannually. She reports most recent stents were in 2010. She also had a pericardial window in 2010. Pt takes 1.5 Lasix daily and reports she takes 2 pills daily when she gains weight or feels swollen. She notes intermittent increase in use in the last 1.5-2 weeks.  Past Medical History  Diagnosis Date  . Atrial fibrillation   . Coronary atherosclerosis of native coronary artery     DES x 2 to RCA 10/10  . GERD (gastroesophageal reflux disease)   . Tachycardia-bradycardia syndrome     s/p Medtronic Adapta L dual chamber device  5/10  . Hyperlipidemia   . COPD (chronic obstructive pulmonary disease)   . Anxiety   . Resting tremor   . 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 Chronic back pain   . Pericardial effusion     Hemorrhagic   . Essential hypertension, benign   . Headache(784.0)   . H/O hiatal hernia   . Neuromuscular disorder     Tremors   Past Surgical History   Procedure Laterality Date  . Cholecystectomy    . Appendectomy    . Subxiphoid pericardial window  11/10  . Esophagogastroduodenoscopy with esophageal dilation  2004, 2006, 2007  . Vaginal hysterectomy    . Colonoscopy  2011  . Right rotator cuff repair    . Insert / replace / remove pacemaker    . Left heart catheterization with coronary angiogram N/A 11/17/2011    Procedure: LEFT HEART CATHETERIZATION WITH CORONARY ANGIOGRAM;  Surgeon: Sherren Mocha, MD;  Location: [facility] CATH LAB;  Service: Cardiovascular;  Laterality: N/A;   Family History  Problem Relation Age of Onset  . Cancer Mother     Colon   . Coronary artery disease Sister   . Coronary artery disease Brother   . Arthritis    . Lung disease    . Asthma     History  Substance Use Topics  . Smoking status: Never Smoker   . Smokeless tobacco: Never Used  . Alcohol Use: No   OB History    No data available     Review of Systems  Constitutional: Negative for fever, chills, diaphoresis, appetite change and fatigue.  HENT: Negative for mouth sores, sore throat and trouble swallowing.   Eyes: Negative for visual disturbance.  Respiratory: Positive for shortness of breath. Negative for cough, chest  tightness and wheezing.   Cardiovascular: Positive for chest pain.  Gastrointestinal: Positive for abdominal pain. Negative for nausea, vomiting, diarrhea and abdominal distention.  Endocrine: Negative for polydipsia, polyphagia and polyuria.  Genitourinary: Negative for dysuria, frequency and hematuria.  Musculoskeletal: Positive for back pain. Negative for gait problem.  Skin: Negative for color change, pallor and rash.  Neurological: Negative for dizziness, syncope, light-headedness and headaches.  Hematological: Does not bruise/bleed easily.  Psychiatric/Behavioral: Negative for behavioral problems and confusion.      Allergies  Amitriptyline hcl and Sulfonamide derivatives  Home Medications   Prior to Admission  medications   Medication Sig Start Date End Date Taking? Authorizing Provider  ALPRAZolam Duanne Moron) 1 MG tablet Take 1 mg by mouth at bedtime.   Yes Historical Provider, MD  amiodarone (PACERONE) 100 MG tablet Take 1 tablet (100 mg total) by mouth daily. Patient taking differently: Take 200 mg by mouth daily.  05/17/14  Yes Satira Sark, MD  aspirin EC 81 MG tablet Take 81 mg by mouth daily.   Yes Historical Provider, MD  clopidogrel (PLAVIX) 75 MG tablet Take 75 mg by mouth every morning. 01/06/11  Yes Satira Sark, MD  Cyanocobalamin (B-12 PO) Take 1 tablet by mouth daily as needed (for supplement).   Yes Historical Provider, MD  esomeprazole (NEXIUM) 40 MG capsule Take 40 mg by mouth daily.    Yes Historical Provider, MD  furosemide (LASIX) 40 MG tablet Take 60 mg by mouth daily. PT ALTERNATES DOSAGE DEPENDING ON SWELLING 12/04/11  Yes Satira Sark, MD  metoprolol (TOPROL-XL) 50 MG 24 hr tablet Take 75 mg by mouth every morning.    Yes Historical Provider, MD  Multiple Vitamin (MULTIVITAMIN) tablet Take 1 tablet by mouth daily.   Yes Historical Provider, MD  nitroGLYCERIN (NITROSTAT) 0.4 MG SL tablet Place 0.4 mg under the tongue. Take as directed for chest pain    Yes Historical Provider, MD  potassium chloride SA (K-DUR,KLOR-CON) 20 MEQ tablet Take 20 mEq by mouth daily. **TAKES ONE TABLET DAILY BUT IF FLUID RETENTION INCREASES PATIENT TAKES SECOND TABLET FOR CRAMPING 06/04/11  Yes Rexene Alberts, MD  primidone (MYSOLINE) 50 MG tablet Take 50 mg by mouth at bedtime. *MAY TAKE TWICE DAILY IF TREMORS INCREASE 10/31/10  Yes Historical Provider, MD  simvastatin (ZOCOR) 5 MG tablet Take 5 mg by mouth at bedtime.   Yes Historical Provider, MD  thiamine (VITAMIN B-1) 100 MG tablet Take 100 mg by mouth daily.   Yes Historical Provider, MD  valsartan (DIOVAN) 40 MG tablet Take 40 mg by mouth every evening.   Yes Historical Provider, MD  VITAMIN D, CHOLECALCIFEROL, PO Take 1 capsule by mouth  daily.    Yes Historical Provider, MD  vitamin E 400 UNIT capsule Take 400 Units by mouth daily.   Yes Historical Provider, MD   BP 120/69 mmHg  Pulse 63  Temp(Src) 98.8 F (37.1 C) (Oral)  Resp 21  Ht 5' 6.5" (1.689 m)  Wt 163 lb (73.936 kg)  BMI 25.92 kg/m2  SpO2 96% Physical Exam  Constitutional: She is oriented to person, place, and time. She appears well-developed and well-nourished. No distress.  HENT:  Head: Normocephalic.  Mouth/Throat: Oropharynx is clear and moist.  Eyes: Conjunctivae are normal. Pupils are equal, round, and reactive to light. No scleral icterus.  Neck: Normal range of motion. Neck supple. No JVD present. No thyromegaly present.  No JVD  Cardiovascular: Normal rate and regular rhythm.  Exam reveals no  gallop and no friction rub.   No murmur heard. No leg swelling  Pulmonary/Chest: Effort normal and breath sounds normal. No respiratory distress. She has no wheezes. She has no rales.  Abdominal: Soft. Bowel sounds are normal. She exhibits no distension. There is tenderness. There is no rebound.  Mild diffuse abdominal tenderness  Musculoskeletal: Normal range of motion.  TTP upper lumbar spine  Neurological: She is alert and oriented to person, place, and time.  Skin: Skin is warm and dry. No rash noted.  Psychiatric: She has a normal mood and affect. Her behavior is normal.  Nursing note and vitals reviewed.   ED Course  Procedures (including critical care time) DIAGNOSTIC STUDIES: Oxygen Saturation is 100% on 2L/min, normal by my interpretation.    COORDINATION OF CARE: 5:10 PM Discussed treatment plan with pt at bedside and pt agreed to plan.    Labs Review Labs Reviewed  BASIC METABOLIC PANEL - Abnormal; Notable for the following:    Glucose, Bld 104 (*)    GFR calc non Af Amer 79 (*)    All other components within normal limits  CBC WITH DIFFERENTIAL  TROPONIN I  TROPONIN I  LIPASE, BLOOD  HEPATIC FUNCTION PANEL  TROPONIN I   TROPONIN I  TROPONIN I  COMPREHENSIVE METABOLIC PANEL  CBC    Imaging Review Dg Chest Portable 1 View  08/23/2014   CLINICAL DATA:  Central chest pain for 2 hr.  EXAM: PORTABLE CHEST - 1 VIEW  COMPARISON:  12/04/2011  FINDINGS: The heart is mildly enlarged but stable. Stable pacer wires. There is tortuosity, ectasia and calcification of the thoracic aorta. The lungs are grossly clear. Low lung volumes with mild vascular crowding and streaky atelectasis. No pleural effusion. The bony thorax is intact.  IMPRESSION: Stable cardiac enlargement.  Low lung volumes with vascular crowding and atelectasis.   Electronically Signed   By: Kalman Jewels M.D.   On: 08/23/2014 17:25     EKG Interpretation None      MDM   Final diagnoses:  Chest pain   Patient became pain-free in the emergency room. Second troponin normal. Placed a nitroglycerin patch. Blood pressures improved. I discussed the case with Dr. Barbaraann Faster. Patient admitted for ACS rule out.   Tanna Furry, MD 08/23/14 [phone]

## 2014-08-23 NOTE — H&P (Signed)
[facility] History and Physical  [name] BJY:[id number] DOB: [birth year] DOA: 08/23/2014  Referring physician: Dr Fayrene Fearing - [facility] PCP: Catalina Pizza, MD   Chief Complaint: CP  HPI: [name] is a 79 y.o. female  CP started 7 hours ago. associaged w/ SOB. Started in spine adn moved to chest. No change w/ deep breathing or palpation. Pt was ambulating at time of onset. Some relief w/ NTG (complete relief w/ nitro paste in ED). Similar to cardiac CP in th past. Cardiac cath w/ stents in 2010.   Review of Systems:  Constitutional:  No weight loss, night sweats, Fevers, chills, fatigue.  HEENT:  No headaches, Difficulty swallowing,Tooth/dental problems,Sore throat,  No sneezing, itching, ear ache, nasal congestion, post nasal drip,  Cardio-vascular: Per HPI GI:  Baseline heart burn Resp:   No shortness of breath with exertion or at rest. No excess mucus, no productive cough, No non-productive cough, No coughing up of blood.No change in color of mucus.No wheezing.No chest wall deformity  Skin:  no rash or lesions.  GU:  no dysuria, change in color of urine, no urgency or frequency. No flank pain.  Musculoskeletal:   No joint pain or swelling. No decreased range of motion. No back pain.  Psych:  No change in mood or affect. No depression or anxiety. No memory loss.   Past Medical History  Diagnosis Date  . Atrial fibrillation   . Coronary atherosclerosis of native coronary artery     DES x 2 to RCA 10/10  . GERD (gastroesophageal reflux disease)   . Tachycardia-bradycardia syndrome     s/p Medtronic Adapta L dual chamber device  5/10  . Hyperlipidemia   . COPD (chronic obstructive pulmonary disease)   . Anxiety   . Resting tremor   . 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 Chronic back pain   . Pericardial effusion     Hemorrhagic   . Essential hypertension, benign   . Headache(784.0)   . H/O hiatal hernia   . Neuromuscular disorder     Tremors     Past Surgical History  Procedure Laterality Date  . Cholecystectomy    . Appendectomy    . Subxiphoid pericardial window  11/10  . Esophagogastroduodenoscopy with esophageal dilation  2004, 2006, 2007  . Vaginal hysterectomy    . Colonoscopy  2011  . Right rotator cuff repair    . Insert / replace / remove pacemaker    . Left heart catheterization with coronary angiogram N/A 11/17/2011    Procedure: LEFT HEART CATHETERIZATION WITH CORONARY ANGIOGRAM;  Surgeon: Tonny Bollman, MD;  Location: [facility] CATH LAB;  Service: Cardiovascular;  Laterality: N/A;   Social History:  reports that she has never smoked. She has never used smokeless tobacco. She reports that she does not drink alcohol or use illicit drugs.  Allergies  Allergen Reactions  . Amitriptyline Hcl Other (See Comments)    Caused "jaws to twist and lock"  . Sulfonamide Derivatives     UNKNOWN REACTION    Family History  Problem Relation Age of Onset  . Cancer Mother     Colon   . Coronary artery disease Sister   . Coronary artery disease Brother   . Arthritis    . Lung disease    . Asthma       Prior to Admission medications   Medication Sig Start Date End Date Taking? Authorizing Provider  ALPRAZolam Prudy Feeler) 1 MG tablet Take  1 mg by mouth at bedtime.   Yes Historical Provider, MD  amiodarone (PACERONE) 100 MG tablet Take 1 tablet (100 mg total) by mouth daily. Patient taking differently: Take 200 mg by mouth daily.  05/17/14  Yes Jonelle Sidle, MD  clopidogrel (PLAVIX) 75 MG tablet Take 75 mg by mouth every morning. 01/06/11  Yes Jonelle Sidle, MD  esomeprazole (NEXIUM) 40 MG capsule Take 40 mg by mouth daily.    Yes Historical Provider, MD  furosemide (LASIX) 40 MG tablet Take 60 mg by mouth daily. PT ALTERNATES DOSAGE DEPENDING ON SWELLING 12/04/11  Yes Jonelle Sidle, MD  metoprolol (TOPROL-XL) 50 MG 24 hr tablet Take 75 mg by mouth every morning.    Yes Historical Provider, MD  simvastatin (ZOCOR) 5 MG  tablet Take 5 mg by mouth at bedtime.   Yes Historical Provider, MD  valsartan (DIOVAN) 40 MG tablet Take 40 mg by mouth every evening.   Yes Historical Provider, MD  aspirin 325 MG EC tablet Take 325 mg by mouth daily.    Historical Provider, MD  Cyanocobalamin (B-12 PO) Take 1 tablet by mouth every other day.     Historical Provider, MD  Multiple Vitamin (MULTIVITAMIN) tablet Take 1 tablet by mouth daily.    Historical Provider, MD  nitroGLYCERIN (NITROSTAT) 0.4 MG SL tablet Place 0.4 mg under the tongue. Take as directed for chest pain     Historical Provider, MD  potassium chloride SA (K-DUR,KLOR-CON) 20 MEQ tablet Take 20 mEq by mouth 2 (two) times daily. PT ALTERNATES WITH LASIX PRN INCREASE DOSAGE 06/04/11   Elliot Cousin, MD  primidone (MYSOLINE) 50 MG tablet Take 50 mg by mouth daily.  10/31/10   Historical Provider, MD  VITAMIN D, CHOLECALCIFEROL, PO Take by mouth as needed.    Historical Provider, MD   Physical Exam: Filed Vitals:   08/23/14 1730 08/23/14 1830 08/23/14 1930 08/23/14 2237  BP: 150/83 155/84 108/49 120/69  Pulse: 61 69  63  Temp:      TempSrc:      Resp: 20 22 22 21   Height:      Weight:      SpO2: 97% 98%  96%    Wt Readings from Last 3 Encounters:  08/23/14 73.936 kg (163 lb)  05/08/14 73.936 kg (163 lb)  09/02/13 73.936 kg (163 lb)    General: Appears calm and comfortable Eyes:  PERRL, normal lids, irises & conjunctiva ENT: Dry mm Neck:  no LAD, masses or thyromegaly Cardiovascular:  Irregularly irregular.  no II/VI systolic murmur. Trace LE edema.  Respiratory:  CTA bilaterally, no w/r/r. Normal respiratory effort. Abdomen:  soft, ntnd Skin:  no rash or induration seen on limited exam Musculoskeletal:  grossly normal tone BUE/BLE Psychiatric:  grossly normal mood and affect, speech fluent and appropriate Neurologic:  grossly non-focal.          Labs on Admission:  Basic Metabolic Panel:  Recent Labs Lab 08/23/14 1723  NA 139  K 3.8  CL  104  CO2 26  GLUCOSE 104*  BUN 15  CREATININE 0.79  CALCIUM 9.8   Liver Function Tests:  Recent Labs Lab 08/23/14 2025  AST 23  ALT 20  ALKPHOS 82  BILITOT 0.5  PROT 7.6  ALBUMIN 4.0    Recent Labs Lab 08/23/14 2025  LIPASE 35   No results for input(s): AMMONIA in the last 168 hours. CBC:  Recent Labs Lab 08/23/14 1723  WBC 5.7  NEUTROABS 3.1  HGB 12.3  HCT 38.4  MCV 93.2  PLT 199   Cardiac Enzymes:  Recent Labs Lab 08/23/14 1723 08/23/14 2025  TROPONINI 0.03 0.03    BNP (last 3 results) No results for input(s): PROBNP in the last 8760 hours. CBG: No results for input(s): GLUCAP in the last 168 hours.  Radiological Exams on Admission: Dg Chest Portable 1 View  08/23/2014   CLINICAL DATA:  Central chest pain for 2 hr.  EXAM: PORTABLE CHEST - 1 VIEW  COMPARISON:  12/04/2011  FINDINGS: The heart is mildly enlarged but stable. Stable pacer wires. There is tortuosity, ectasia and calcification of the thoracic aorta. The lungs are grossly clear. Low lung volumes with mild vascular crowding and streaky atelectasis. No pleural effusion. The bony thorax is intact.  IMPRESSION: Stable cardiac enlargement.  Low lung volumes with vascular crowding and atelectasis.   Electronically Signed   By: Loralie Champagne M.D.   On: 08/23/2014 17:25    EKG: Ordered  Assessment/Plan Principal Problem:   Chest pain Active Problems:   HLD (hyperlipidemia)   Anxiety state   Essential hypertension, benign   Atrial fibrillation   BRADYCARDIA-TACHYCARDIA SYNDROME   GERD   Abnormal involuntary movement   CP: pts description of symptoms and relief w/ nitro concerning for cardiac ischemia. H/o CAD and Cath w/ stent placement in 2010.  - Admit - Tele - continue home ASA and plavix - GI cocktail  HTN: Hypertensive on admission. Pt anxious - contineu home diovan, metop,   Afib:  Also w/ h/o Tachy/Brady. Currently rate controlled. Not a candidate for anticoagulation -  Continue metop, amiodarone,   Chronic Diastolic CHF: No exacerbation. Last Echo 2013 w/ EF 60-65% and Grade 2 diastolic dysfunction - Cont bblocker - continue lasix  Tremor:  - continue home primidone  ANxiety:  - continue xanax  HLD:  Continue zocor  GERD:  - contineu PPI   Code Status: FULL DVT Prophylaxis: Hep Family Communication: None Disposition Plan: Pending   Cammi Consalvo Shela Commons, MD Family Medicine [facility] [email or web address] Password [email or web address]

## 2014-08-23 NOTE — ED Notes (Signed)
MD at bedside. 

## 2014-08-23 NOTE — ED Notes (Signed)
Patient arrives via EMS from Dr Juel Burrow office. C/o central chest to right chest pain x 2 hours. Initially BP 226/112, after 2 nitro SL that eased pain, pt BP 144/84. Patient arrives alert/oriented. Appears anxious. Was given 324 mg ASA. Takes plavix.

## 2014-08-24 DIAGNOSIS — I25119 Atherosclerotic heart disease of native coronary artery with unspecified angina pectoris: Secondary | ICD-10-CM | POA: Diagnosis not present

## 2014-08-24 DIAGNOSIS — R079 Chest pain, unspecified: Secondary | ICD-10-CM | POA: Diagnosis not present

## 2014-08-24 DIAGNOSIS — E785 Hyperlipidemia, unspecified: Secondary | ICD-10-CM | POA: Diagnosis not present

## 2014-08-24 DIAGNOSIS — I482 Chronic atrial fibrillation: Secondary | ICD-10-CM | POA: Diagnosis not present

## 2014-08-24 DIAGNOSIS — I10 Essential (primary) hypertension: Secondary | ICD-10-CM | POA: Diagnosis not present

## 2014-08-24 LAB — COMPREHENSIVE METABOLIC PANEL
ALK PHOS: 73 U/L (ref 39–117)
ALT: 19 U/L (ref 0–35)
AST: 22 U/L (ref 0–37)
Albumin: 3.7 g/dL (ref 3.5–5.2)
Anion gap: 6 (ref 5–15)
BUN: 15 mg/dL (ref 6–23)
CHLORIDE: 103 meq/L (ref 96–112)
CO2: 29 mmol/L (ref 19–32)
CREATININE: 0.71 mg/dL (ref 0.50–1.10)
Calcium: 9 mg/dL (ref 8.4–10.5)
GFR calc Af Amer: >90 mL/min (ref >90)
GFR calc non Af Amer: 82 mL/min — ABNORMAL LOW (ref >90)
GLUCOSE: 81 mg/dL (ref 70–99)
Potassium: 3.6 mmol/L (ref 3.5–5.1)
Sodium: 138 mmol/L (ref 135–145)
TOTAL PROTEIN: 6.9 g/dL (ref 6.0–8.3)
Total Bilirubin: 0.6 mg/dL (ref 0.3–1.2)

## 2014-08-24 LAB — CBC
HEMATOCRIT: 35.1 % — AB (ref 36.0–46.0)
HEMOGLOBIN: 11.4 g/dL — AB (ref 12.0–15.0)
MCH: 30.7 pg (ref 26.0–34.0)
MCHC: 32.5 g/dL (ref 30.0–36.0)
MCV: 94.6 fL (ref 78.0–100.0)
Platelets: 184 10*3/uL (ref 150–400)
RBC: 3.71 MIL/uL — ABNORMAL LOW (ref 3.87–5.11)
RDW: 14.1 % (ref 11.5–15.5)
WBC: 4.9 10*3/uL (ref 4.0–10.5)

## 2014-08-24 LAB — TROPONIN I

## 2014-08-24 MED ORDER — PRIMIDONE 50 MG PO TABS
ORAL_TABLET | ORAL | Status: AC
  Filled 2014-08-24: qty 1

## 2014-08-24 MED ORDER — REGADENOSON 0.4 MG/5ML IV SOLN
0.4000 mg | Freq: Once | INTRAVENOUS | Status: AC
  Administered 2014-08-25: 0.4 mg via INTRAVENOUS
  Filled 2014-08-24: qty 5

## 2014-08-24 NOTE — Consult Note (Signed)
Primary cardiologist: Dr. Satira Sark Consulting cardiologist: Dr. Satira Sark  Reason for consultation: Chest pain  Clinical Summary [name] is a 79 y.o.female with past medical history outlined below, last seen in the office in October 2015. She is now admitted to the hospital describing onset of thoracic discomfort yesterday while she was walking to the bathroom. She describes a discomfort that began in her right posterior shoulder area, radiating around the chest, and became more intense, took her breath away. She describes a very sharp sensation. Reportedly, symptoms were improved with nitroglycerin in the ER, she remains on nitroglycerin paste.  She states that she has been compliant with her medications. No sense of palpitations, dizziness or syncope. She does report having increased stress.  Cardiac markers have been normal, arguing against ACS. ECG shows an atrial paced rhythm.  Last ischemic workup was in 2012 with Lexiscan Myoview showing no definite ischemia and LVEF 67%.   Allergies  Allergen Reactions  . Amitriptyline Hcl Other (See Comments)    Caused "jaws to twist and lock"  . Sulfonamide Derivatives     UNKNOWN REACTION    Medications Scheduled Medications: . ALPRAZolam  1 mg Oral QHS  . amiodarone  100 mg Oral Daily  . aspirin EC  81 mg Oral Daily  . clopidogrel  75 mg Oral QAC breakfast  . furosemide  60 mg Oral Daily  . heparin  5,000 Units Subcutaneous 3 times per day  . irbesartan  75 mg Oral Daily  . metoprolol succinate  75 mg Oral Q breakfast  . nitroGLYCERIN  1 inch Topical 4 times per day  . pantoprazole  80 mg Oral Q1200  . potassium chloride SA  20 mEq Oral Daily  . primidone  50 mg Oral QHS  . simvastatin  5 mg Oral QHS  . sodium chloride  3 mL Intravenous Q12H    PRN Medications: acetaminophen, gi cocktail, morphine injection, morphine injection, ondansetron (ZOFRAN) IV   Past Medical History  Diagnosis Date  . Atrial  fibrillation   . Coronary atherosclerosis of native coronary artery     DES x 2 to RCA 10/10  . GERD (gastroesophageal reflux disease)   . Tachycardia-bradycardia syndrome     s/p Medtronic Adapta L dual chamber device  5/10  . Hyperlipidemia   . COPD (chronic obstructive pulmonary disease)   . Anxiety   . Resting tremor   . 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 Chronic back pain   . Pericardial effusion     Hemorrhagic   . Essential hypertension, benign   . Headache(784.0)   . H/O hiatal hernia   . Neuromuscular disorder     Tremors    Past Surgical History  Procedure Laterality Date  . Cholecystectomy    . Appendectomy    . Subxiphoid pericardial window  11/10  . Esophagogastroduodenoscopy with esophageal dilation  2004, 2006, 2007  . Vaginal hysterectomy    . Colonoscopy  2011  . Right rotator cuff repair    . Insert / replace / remove pacemaker    . Left heart catheterization with coronary angiogram N/A 11/17/2011    Procedure: LEFT HEART CATHETERIZATION WITH CORONARY ANGIOGRAM;  Surgeon: Sherren Mocha, MD;  Location: [facility] CATH LAB;  Service: Cardiovascular;  Laterality: N/A;    Family History  Problem Relation Age of Onset  . Cancer Mother     Colon   . Coronary artery disease Sister   .  Coronary artery disease Brother   . Arthritis    . Lung disease    . Asthma      Social History [name] reports that she has never smoked. She has never used smokeless tobacco. [name] reports that she does not drink alcohol.  Review of Systems Complete review of systems negative except as otherwise outlined in the clinical summary and also the following. No fevers or chills, no cough. Reports stable appetite. Chronic tremor. No falls.  Physical Examination Blood pressure 100/58, pulse 60, temperature 97.4 F (36.3 C), temperature source Oral, resp. rate 18, height 5\' 6"  (1.676 m), weight 153 lb 12.8 oz (69.763 kg), SpO2 100 %.  Intake/Output  Summary (Last 24 hours) at 08/24/14 1041 Last data filed at 08/24/14 0900  Gross per 24 hour  Intake    240 ml  Output      0 ml  Net    240 ml   Telemetry: Atrial paced rhythm.  Chronically ill-appearing woman, comfortable at rest.  HEENT: Conjunctiva and lids are normal, oropharynx clear.  Neck: Supple, no JVD, no bruits.  Lungs: Clear to auscultation with diminished breath sounds mainly at bases.  Cardiac: Regular rate and rhythm without S3 gallop. No pericardial rub or knock.  Abdomen: Soft, nontender, bowel sounds present  Extremities: Trace edema.  Skin: Warm and dry.  Musculoskeletal: Kyphosis noted.  Neuropsychiatric: Alert and oriented x3, resting tremor.   Lab Results  Basic Metabolic Panel:  Recent Labs Lab 08/23/14 1723 08/24/14 0455  NA 139 138  K 3.8 3.6  CL 104 103  CO2 26 29  GLUCOSE 104* 81  BUN 15 15  CREATININE 0.79 0.71  CALCIUM 9.8 9.0    Liver Function Tests:  Recent Labs Lab 08/23/14 2025 08/24/14 0455  AST 23 22  ALT 20 19  ALKPHOS 82 73  BILITOT 0.5 0.6  PROT 7.6 6.9  ALBUMIN 4.0 3.7    CBC:  Recent Labs Lab 08/23/14 1723 08/24/14 0455  WBC 5.7 4.9  NEUTROABS 3.1  --   HGB 12.3 11.4*  HCT 38.4 35.1*  MCV 93.2 94.6  PLT 199 184    Cardiac Enzymes:  Recent Labs Lab 08/23/14 1723 08/23/14 2025 08/23/14 2301 08/24/14 0122 08/24/14 0455  TROPONINI 0.03 0.03 <0.03 <0.03 <0.03    Imaging EXAM: PORTABLE CHEST - 1 VIEW  COMPARISON: 12/04/2011  FINDINGS: The heart is mildly enlarged but stable. Stable pacer wires. There is tortuosity, ectasia and calcification of the thoracic aorta. The lungs are grossly clear. Low lung volumes with mild vascular crowding and streaky atelectasis. No pleural effusion. The bony thorax is intact.  IMPRESSION: Stable cardiac enlargement.  Low lung volumes with vascular crowding and atelectasis.  Impression  1. Chest pain syndrome, typical and atypical features.  Cardiac markers are normal and argue against ACS. ECG shows atrial paced rhythm. She states that symptom intensity was worse than usual. Chest x-ray shows no acute findings. Nitroglycerine reportedly helpful.  2. CAD status post DES 2 to the RCA in 2010.  3. History of tachycardia-bradycardia syndrome with atrial fibrillation status post Medtronic pacemaker.  4. History of hemorrhagic pericarditis/effusion, possibly related to microperforation with prior device placement. She has not been on chronic anticoagulation secondary to this.  5. Essential hypertension. Patient reports blood pressure was elevated when EMS evaluated her at home prior to transfer.   Recommendations  Discussed with patient. Plan will be to schedule a Lexiscan Cardiolite for tomorrow as an inpatient for follow-up ischemic  testing, last evaluation was in 2012. We will discontinue nitroglycerin paste, otherwise continue her present cardiac regimen.  Satira Sark, M.D., F.A.C.C.

## 2014-08-24 NOTE — Progress Notes (Signed)
[facility] PROGRESS NOTE  ARMA REINING EXN:[id number] DOB: 1935/12/01 DOA: 08/23/2014 PCP: Delphina Cahill, MD  Assessment/Plan:  CP: history of chest pain syndrome. H/o CAD and Cath w/ stent placement in 2010. Reports pain worse than usual. Troponin negative to date. Chest xray negative. No events on tele. Evaluated by cardiology who recommend inpatient Lexiscan Cardiolite for tomorrow.   HTN: Hypertensive on admission. Improved control.    Afib: Also w/ h/o Tachy/Brady. remains rate controlled. Not a candidate for anticoagulation. Continue home meds.   Chronic Diastolic CHF:  Compensated at this time.  Last Echo 2013 w/ EF 60-65% and Grade 2 diastolic dysfunction. Continue BB and lasix. Daily weights and intake and output  Tremor: stable at baseline. Continue home primidone  ANxiety:  Appears stable at baseline HLD: obtain lipid panel. Continue zocor  GERD: stable baseline. contineu PPI   Code Status: full Family Communication: none present Disposition Plan: home hopefully tomorrow   Consultants:  cardiology  Procedures:  none  Antibiotics:  none  HPI/Subjective: Sitting on side of bed talking on phone. Denies pain/discomfort  Objective: Filed Vitals:   08/24/14 1029  BP: 100/58  Pulse: 60  Temp: 97.4 F (36.3 C)  Resp:     Intake/Output Summary (Last 24 hours) at 08/24/14 1155 Last data filed at 08/24/14 0900  Gross per 24 hour  Intake    240 ml  Output      0 ml  Net    240 ml   Filed Weights   08/23/14 1631 08/23/14 2316  Weight: 73.936 kg (163 lb) 69.763 kg (153 lb 12.8 oz)    Exam:   General:  Somewhat frail in no acute distress  Cardiovascular: RRR no m/g/r no LE edema  Respiratory: normal effort BS clear bilaterally no wheeze  Abdomen: soft +BS non-tender  Musculoskeletal: no clubbing or cyanosis   Data Reviewed: Basic Metabolic Panel:  Recent Labs Lab 08/23/14 1723 08/24/14 0455  NA 139 138  K 3.8 3.6  CL 104 103   CO2 26 29  GLUCOSE 104* 81  BUN 15 15  CREATININE 0.79 0.71  CALCIUM 9.8 9.0   Liver Function Tests:  Recent Labs Lab 08/23/14 2025 08/24/14 0455  AST 23 22  ALT 20 19  ALKPHOS 82 73  BILITOT 0.5 0.6  PROT 7.6 6.9  ALBUMIN 4.0 3.7    Recent Labs Lab 08/23/14 2025  LIPASE 35   No results for input(s): AMMONIA in the last 168 hours. CBC:  Recent Labs Lab 08/23/14 1723 08/24/14 0455  WBC 5.7 4.9  NEUTROABS 3.1  --   HGB 12.3 11.4*  HCT 38.4 35.1*  MCV 93.2 94.6  PLT 199 184   Cardiac Enzymes:  Recent Labs Lab 08/23/14 1723 08/23/14 2025 08/23/14 2301 08/24/14 0122 08/24/14 0455  TROPONINI 0.03 0.03 <0.03 <0.03 <0.03   BNP (last 3 results) No results for input(s): PROBNP in the last 8760 hours. CBG: No results for input(s): GLUCAP in the last 168 hours.  No results found for this or any previous visit (from the past 240 hour(s)).   Studies: Dg Chest Portable 1 View  08/23/2014   CLINICAL DATA:  Central chest pain for 2 hr.  EXAM: PORTABLE CHEST - 1 VIEW  COMPARISON:  12/04/2011  FINDINGS: The heart is mildly enlarged but stable. Stable pacer wires. There is tortuosity, ectasia and calcification of the thoracic aorta. The lungs are grossly clear. Low lung volumes with mild vascular crowding and streaky atelectasis. No pleural effusion.  The bony thorax is intact.  IMPRESSION: Stable cardiac enlargement.  Low lung volumes with vascular crowding and atelectasis.   Electronically Signed   By: Kalman Jewels M.D.   On: 08/23/2014 17:25    Scheduled Meds: . ALPRAZolam  1 mg Oral QHS  . amiodarone  100 mg Oral Daily  . aspirin EC  81 mg Oral Daily  . clopidogrel  75 mg Oral QAC breakfast  . furosemide  60 mg Oral Daily  . heparin  5,000 Units Subcutaneous 3 times per day  . irbesartan  75 mg Oral Daily  . metoprolol succinate  75 mg Oral Q breakfast  . pantoprazole  80 mg Oral Q1200  . potassium chloride SA  20 mEq Oral Daily  . primidone  50 mg Oral  QHS  . [START ON 08/25/2014] regadenoson  0.4 mg Intravenous Once  . simvastatin  5 mg Oral QHS  . sodium chloride  3 mL Intravenous Q12H   Continuous Infusions:   Principal Problem:   Chest pain Active Problems:   HLD (hyperlipidemia)   Anxiety state   Essential hypertension, benign   Atrial fibrillation   BRADYCARDIA-TACHYCARDIA SYNDROME   GERD   Abnormal involuntary movement   Diastolic dysfunction with chronic heart failure    Time spent: [facility] Pager [phone]. If 7PM-7AM, please contact night-coverage at [email or web address], password [facility] 08/24/2014, 11:55 AM  LOS: 1 day

## 2014-08-24 NOTE — Progress Notes (Signed)
UR chart review completed.  

## 2014-08-24 NOTE — Progress Notes (Signed)
Nitropaste removed per Dr. Charolette Forward orders.  Will recheck vital in an hour.  Blood pressure medications held at this time.

## 2014-08-24 NOTE — Care Management Note (Addendum)
    Page 1 of 1   08/25/2014     10:12:40 AM CARE MANAGEMENT NOTE 08/25/2014  Patient:  [name], [name]   Account Number:  [id number]  Date Initiated:  08/24/2014  Documentation initiated by:  Theophilus Kinds  Subjective/Objective Assessment:   Pt admitted from home with CP. Pt lives with her daughter and will return home at discharge. Pt is independent with ADl's.     Action/Plan:   No CM needs noted.   Anticipated DC Date:  08/25/2014   Anticipated DC Plan:  [place]  CM consult      Choice offered to / List presented to:             Status of service:  Completed, signed off Medicare Important Message given?   (If response is "NO", the following Medicare IM given date fields will be blank) Date Medicare IM given:   Medicare IM given by:   Date Additional Medicare IM given:   Additional Medicare IM given by:    Discharge Disposition:  HOME/SELF CARE  Per UR Regulation:    If discussed at Long Length of Stay Meetings, dates discussed:    Comments:  08/25/14 [place], RN BSN CM Anticipate discharge today. CC 44 given to pt as pt was changed to OBS. No CM needs noted.  08/24/14 [place], RN BSN CM

## 2014-08-25 ENCOUNTER — Inpatient Hospital Stay (HOSPITAL_COMMUNITY): Payer: Medicare Other

## 2014-08-25 ENCOUNTER — Encounter (HOSPITAL_COMMUNITY): Payer: Self-pay

## 2014-08-25 DIAGNOSIS — I25119 Atherosclerotic heart disease of native coronary artery with unspecified angina pectoris: Secondary | ICD-10-CM | POA: Diagnosis not present

## 2014-08-25 DIAGNOSIS — I2510 Atherosclerotic heart disease of native coronary artery without angina pectoris: Secondary | ICD-10-CM | POA: Diagnosis not present

## 2014-08-25 DIAGNOSIS — E785 Hyperlipidemia, unspecified: Secondary | ICD-10-CM | POA: Diagnosis not present

## 2014-08-25 DIAGNOSIS — I10 Essential (primary) hypertension: Secondary | ICD-10-CM | POA: Diagnosis not present

## 2014-08-25 DIAGNOSIS — K219 Gastro-esophageal reflux disease without esophagitis: Secondary | ICD-10-CM | POA: Diagnosis not present

## 2014-08-25 DIAGNOSIS — I4891 Unspecified atrial fibrillation: Secondary | ICD-10-CM | POA: Diagnosis not present

## 2014-08-25 DIAGNOSIS — I482 Chronic atrial fibrillation: Secondary | ICD-10-CM | POA: Diagnosis not present

## 2014-08-25 DIAGNOSIS — R079 Chest pain, unspecified: Secondary | ICD-10-CM

## 2014-08-25 MED ORDER — CETYLPYRIDINIUM CHLORIDE 0.05 % MT LIQD
7.0000 mL | Freq: Two times a day (BID) | OROMUCOSAL | Status: DC
  Administered 2014-08-25 (×2): 7 mL via OROMUCOSAL

## 2014-08-25 MED ORDER — ISOSORBIDE MONONITRATE ER 30 MG PO TB24: 15.0000 mg | ORAL_TABLET | Freq: Every day | ORAL | Status: DC

## 2014-08-25 MED ORDER — ISOSORBIDE MONONITRATE ER 30 MG PO TB24
15.0000 mg | ORAL_TABLET | Freq: Every day | ORAL | Status: DC
Start: 2014-08-25 — End: 2014-08-25
  Administered 2014-08-25: 15 mg via ORAL
  Filled 2014-08-25: qty 1

## 2014-08-25 MED ORDER — TECHNETIUM TC 99M SESTAMIBI - CARDIOLITE
10.0000 | Freq: Once | INTRAVENOUS | Status: AC | PRN
  Administered 2014-08-25: 07:00:00 10 via INTRAVENOUS

## 2014-08-25 MED ORDER — TECHNETIUM TC 99M SESTAMIBI GENERIC - CARDIOLITE
30.0000 | Freq: Once | INTRAVENOUS | Status: AC | PRN
  Administered 2014-08-25: 30 via INTRAVENOUS

## 2014-08-25 MED ORDER — REGADENOSON 0.4 MG/5ML IV SOLN
0.4000 mg | Freq: Once | INTRAVENOUS | Status: DC | PRN
  Filled 2014-08-25: qty 5

## 2014-08-25 MED ORDER — REGADENOSON 0.4 MG/5ML IV SOLN
INTRAVENOUS | Status: AC
Start: 2014-08-25 — End: 2014-08-25
  Administered 2014-08-25: 0.4 mg via INTRAVENOUS
  Filled 2014-08-25: qty 5

## 2014-08-25 MED ORDER — SODIUM CHLORIDE 0.9 % IJ SOLN
INTRAMUSCULAR | Status: AC
  Administered 2014-08-25: 10 mL via INTRAVENOUS
  Filled 2014-08-25: qty 3

## 2014-08-25 MED ORDER — SODIUM CHLORIDE 0.9 % IJ SOLN
10.0000 mL | INTRAMUSCULAR | Status: DC | PRN
  Administered 2014-08-25: 10 mL via INTRAVENOUS
  Filled 2014-08-25: qty 10

## 2014-08-25 NOTE — Discharge Summary (Signed)
Physician Discharge Summary  [name] NFA:[id number] DOB: 03-May-1936 DOA: 08/23/2014  PCP: Delphina Cahill, MD  Admit date: 08/23/2014 Discharge date: 08/25/2014  Time spent: 40 minutes  Recommendations for Outpatient Follow-up:  1. Follow up with PCP in 1-2 weeks for evaluation of symptoms.  2. Dr McDowel's office will contact patient 08/28/14 to schedule follow up for 2 -3 weeks   Discharge Diagnoses:  Principal Problem:   Chest pain Active Problems:   HLD (hyperlipidemia)   Anxiety state   Essential hypertension, benign   Atrial fibrillation   BRADYCARDIA-TACHYCARDIA SYNDROME   GERD   Abnormal involuntary movement   Diastolic dysfunction with chronic heart failure   Discharge Condition: stable  Diet recommendation: heart health  Filed Weights   08/23/14 1631 08/23/14 2316 08/25/14 0500  Weight: 73.936 kg (163 lb) 69.763 kg (153 lb 12.8 oz) 71 kg (156 lb 8.4 oz)    History of present illness:  CP started 7 hours prior to presentation to ED on 08/23/14. associaged w/ SOB. Started in spine and moved to chest. No change w/ deep breathing or palpation. Pt was ambulating at time of onset. Some relief w/ NTG (complete relief w/ nitro paste in ED). Similar to cardiac CP in th past. Cardiac cath w/ stents in 2010.   Hospital Course:  CP:  Continues with mild intermittent CP. history of chest pain syndrome. H/o CAD and Cath w/ stent placement in 2010. Troponin negative to date. Chest xray negative. No events on tele. Lexiscan stress test this am. Cardiology note yields no acute ST-T wave changes noted during EKG portion of test.  Detailed results Lexiscan below. Overall low risk study. Recommending continuation of medical management and addition of imdur. Per cardiology if continues to have progressive chest pain may consider heart cath. Follow up with cards in 2-3 weeks.  HTN: fair controll   Afib: Also w/ h/o Tachy/Brady. remains rate controlled. Not a candidate for  anticoagulation. Continue home meds.   Chronic Diastolic ION:GEXBMWU compensated. Last Echo 2013 w/ EF 60-65% and Grade 2 diastolic dysfunction. Continue BB and lasix.   Tacycardia-bradycardia syndrome s/p pacemaker: on amiodarone and metoprolol.   Tremor:  Remained stable at baseline.   ANxiety: remained stable at baseline  HLD:  Continue zocor  GERD: stable baseline.   Procedures: Lexiscan results yield   Small region of scar with mild peri-infarct ischemia in the inferolateral wall.  2. Normal left ventricular wall motion.  3. Left ventricular ejection fraction 56%   4. Low-risk stress test findings  Consultations:  cardiology  Discharge Exam: Filed Vitals:   08/25/14 0500  BP: 134/91  Pulse: 63  Temp: 98.8 F (37.1 C)  Resp: 20    General: well nourished Cardiovascular: RRR no MGR No LE edema Respiratory: normal effort BS clear to auscultation bilaterally no wheeze  Discharge Instructions    Current Discharge Medication List    START taking these medications   Details  isosorbide mononitrate (IMDUR) 30 MG 24 hr tablet Take 0.5 tablets (15 mg total) by mouth daily. Qty: 30 tablet, Refills: 0      CONTINUE these medications which have NOT CHANGED   Details  ALPRAZolam (XANAX) 1 MG tablet Take 1 mg by mouth at bedtime.    amiodarone (PACERONE) 100 MG tablet Take 1 tablet (100 mg total) by mouth daily. Qty: 90 tablet, Refills: 3    aspirin EC 81 MG tablet Take 81 mg by mouth daily.    clopidogrel (PLAVIX) 75 MG tablet Take  75 mg by mouth every morning.    Cyanocobalamin (B-12 PO) Take 1 tablet by mouth daily as needed (for supplement).    esomeprazole (NEXIUM) 40 MG capsule Take 40 mg by mouth daily.     furosemide (LASIX) 40 MG tablet Take 60 mg by mouth daily. PT ALTERNATES DOSAGE DEPENDING ON SWELLING    metoprolol (TOPROL-XL) 50 MG 24 hr tablet Take 75 mg by mouth every morning.     Multiple Vitamin (MULTIVITAMIN) tablet Take 1  tablet by mouth daily.   Associated Diagnoses: Essential hypertension, benign    nitroGLYCERIN (NITROSTAT) 0.4 MG SL tablet Place 0.4 mg under the tongue. Take as directed for chest pain     potassium chloride SA (K-DUR,KLOR-CON) 20 MEQ tablet Take 20 mEq by mouth daily. **TAKES ONE TABLET DAILY BUT IF FLUID RETENTION INCREASES PATIENT TAKES SECOND TABLET FOR CRAMPING    primidone (MYSOLINE) 50 MG tablet Take 50 mg by mouth at bedtime. *MAY TAKE TWICE DAILY IF TREMORS INCREASE    simvastatin (ZOCOR) 5 MG tablet Take 5 mg by mouth at bedtime.    thiamine (VITAMIN B-1) 100 MG tablet Take 100 mg by mouth daily.    valsartan (DIOVAN) 40 MG tablet Take 40 mg by mouth every evening.    VITAMIN D, CHOLECALCIFEROL, PO Take 1 capsule by mouth daily.     vitamin E 400 UNIT capsule Take 400 Units by mouth daily.       Allergies  Allergen Reactions  . Amitriptyline Hcl Other (See Comments)    Caused "jaws to twist and lock"  . Sulfonamide Derivatives     UNKNOWN REACTION   Follow-up Information    Follow up with Delphina Cahill, MD. Schedule an appointment as soon as possible for a visit in 1 week.   Specialty:  Internal Medicine   Why:  for evaluation of symptoms   Contact information:    [place] [address] [phone]        The results of significant diagnostics from this hospitalization (including imaging, microbiology, ancillary and laboratory) are listed below for reference.    Significant Diagnostic Studies: Nm Myocar Multi W/spect W/wall Motion / Ef  08/25/2014   CLINICAL DATA:  79 year old woman with CAD status post DES x 2 to the RCA in 2010, atrial fibrillation with tachycardia-bradycardia syndrome status post pacemaker placement, hyperlipidemia, COPD, anxiety, and hypertension. She presents with chest discomfort and has ruled out for myocardial infarction. This study is requested to evaluate for the presence and extent of ischemia.  EXAM: MYOCARDIAL IMAGING  WITH SPECT (REST AND PHARMACOLOGIC-STRESS)  GATED LEFT VENTRICULAR WALL MOTION STUDY  LEFT VENTRICULAR EJECTION FRACTION  TECHNIQUE: Standard myocardial SPECT imaging was performed after resting intravenous injection of 10 mCi Tc-86m sestamibi. Subsequently, intravenous infusion of Lexiscan was performed under the supervision of the Cardiology staff. At peak effect of the drug, 30 mCi Tc-71m sestamibi was injected intravenously and standard myocardial SPECT imaging was performed. Quantitative gated imaging was also performed to evaluate left ventricular wall motion, and estimate left ventricular ejection fraction.  FINDINGS: Baseline ECG shows atrial paced rhythm at 60 beats per min. Lexiscan bolus was given in standard fashion. Heart rate increased from 60 beats per min up to 75 beats per min, and blood pressure increased from 127/76 up to 131/80. Patient tolerated infusion well. There were no diagnostic ST segment abnormalities noted. Rare PVC was seen.  Analysis of the overall perfusion data finds adequate myocardial radiotracer uptake.  Perfusion:  There is a small, mild intensity, apical to basal inferolateral defect that exhibits partial reversibility. This is most consistent with scar and mild peri-infarct ischemia.  Wall Motion: Normal left ventricular wall motion. No left ventricular dilation.  Left Ventricular Ejection Fraction: 56 %  End diastolic volume 55 ml  End systolic volume 24 ml  IMPRESSION: 1. Small region of scar with mild peri-infarct ischemia in the inferolateral wall.  2. Normal left ventricular wall motion.  3. Left ventricular ejection fraction 56%  4. Low-risk stress test findings*.  *2012 Appropriate Use Criteria for Coronary Revascularization Focused Update: J Am Coll Cardiol. 8938;10(1):751-025. http://content.airportbarriers.com.aspx?articleid=1201161   Electronically Signed   By: Rozann Lesches M.D.   On: 08/25/2014 12:00   Dg Chest Portable 1 View  08/23/2014   CLINICAL DATA:   Central chest pain for 2 hr.  EXAM: PORTABLE CHEST - 1 VIEW  COMPARISON:  12/04/2011  FINDINGS: The heart is mildly enlarged but stable. Stable pacer wires. There is tortuosity, ectasia and calcification of the thoracic aorta. The lungs are grossly clear. Low lung volumes with mild vascular crowding and streaky atelectasis. No pleural effusion. The bony thorax is intact.  IMPRESSION: Stable cardiac enlargement.  Low lung volumes with vascular crowding and atelectasis.   Electronically Signed   By: Kalman Jewels M.D.   On: 08/23/2014 17:25    Microbiology: No results found for this or any previous visit (from the past 240 hour(s)).   Labs: Basic Metabolic Panel:  Recent Labs Lab 08/23/14 1723 08/24/14 0455  NA 139 138  K 3.8 3.6  CL 104 103  CO2 26 29  GLUCOSE 104* 81  BUN 15 15  CREATININE 0.79 0.71  CALCIUM 9.8 9.0   Liver Function Tests:  Recent Labs Lab 08/23/14 2025 08/24/14 0455  AST 23 22  ALT 20 19  ALKPHOS 82 73  BILITOT 0.5 0.6  PROT 7.6 6.9  ALBUMIN 4.0 3.7    Recent Labs Lab 08/23/14 2025  LIPASE 35   No results for input(s): AMMONIA in the last 168 hours. CBC:  Recent Labs Lab 08/23/14 1723 08/24/14 0455  WBC 5.7 4.9  NEUTROABS 3.1  --   HGB 12.3 11.4*  HCT 38.4 35.1*  MCV 93.2 94.6  PLT 199 184   Cardiac Enzymes:  Recent Labs Lab 08/23/14 1723 08/23/14 2025 08/23/14 2301 08/24/14 0122 08/24/14 0455  TROPONINI 0.03 0.03 <0.03 <0.03 <0.03   BNP: BNP (last 3 results) No results for input(s): PROBNP in the last 8760 hours. CBG: No results for input(s): GLUCAP in the last 168 hours.     SignedRadene Gunning  [facility] 08/25/2014, 12:51 PM

## 2014-08-25 NOTE — Progress Notes (Signed)
Stress Lab Nurses Notes - [name]  [name] 08/25/2014 Reason for doing test: CAD and Chest Pain Type of test: Lexiscan Cardiolite /Inpatient Rm 341 Nurse performing test: Gerrit Halls, RN Nuclear Medicine Tech: Melburn Hake Echo Tech: Not Applicable MD performing test: S. McDowell/K.Purcell Nails NP Family MD: [place] Crane Test explained and consent signed: Yes.   IV started: Saline lock flushed, No redness or edema and Saline lock from floor Symptoms: Stomach discomfort & nausea Treatment/Intervention: None Reason test stopped: protocol completed After recovery IV was: No redness or edema and Saline Lock flushed Patient to return to [facility]. Med at : 9:15 Patient discharged: Transported back to room 341 via wc Patient's Condition upon discharge was: stable Comments: During test BP 126/73 & HR 75.  Recovery BP 130/74 & HR 68.  Symptoms resolved in recovery. [name] T

## 2014-08-25 NOTE — Progress Notes (Signed)
Patient discharged with instructions, prescription, and care notes.  Verbalized understanding via teach back.  IV was removed and the site was WNL. Patient voiced no further complaints or concerns at the time of discharge.  Appointments per instructions.  Patient left the floor via ambulation  with staff and family in stable condition.

## 2014-08-25 NOTE — Progress Notes (Signed)
    Primary cardiologist: Dr. Satira Sark  Reviewed Cardiolite today. There is a small region of inferolateral scar with mild peri-infarct ischemia, LVEF normal, overall low risk. She has previous history of DES 2 to the RCA in 2010. Cardiac markers have been normal and ECG is without acute ST segment changes. [name] I have discussed situation, and agreed to try and continue medical therapy with some adjustments. If she continues to have progressive chest pain symptoms however, we may need to consider proceeding with a follow-up heart catheterization. We will start her on Imdur 15 mg daily, up titrate from there. Office follow-up in the next few weeks.  Satira Sark, M.D., F.A.C.C.

## 2014-08-25 NOTE — Progress Notes (Signed)
Primary Cardiologist:Marleen Moret, Mikeal Hawthorne MD  Cardiology Specific Problem List: 1. Chest Pain 2. CAD  Subjective:    Occasional pain. Nervous, tremors as before.  Objective:   Temp:  [97.6 F (36.4 C)-98.8 F (37.1 C)] 98.8 F (37.1 C) (01/22 0500) Pulse Rate:  [60-71] 63 (01/22 0500) Resp:  [18-20] 20 (01/22 0500) BP: (111-153)/(54-91) 134/91 mmHg (01/22 0500) SpO2:  [95 %-100 %] 98 % (01/22 0500) Weight:  [156 lb 8.4 oz (71 kg)] 156 lb 8.4 oz (71 kg) (01/22 0500) Last BM Date: 08/23/14  Filed Weights   08/23/14 1631 08/23/14 2316 08/25/14 0500  Weight: 163 lb (73.936 kg) 153 lb 12.8 oz (69.763 kg) 156 lb 8.4 oz (71 kg)    Intake/Output Summary (Last 24 hours) at 08/25/14 1047 Last data filed at 08/24/14 1300  Gross per 24 hour  Intake    240 ml  Output      0 ml  Net    240 ml    Telemetry: NSR/ST  Exam:  General: No acute distress. Nervous, shakey  Lungs: Clear to auscultation, nonlabored.  Cardiac: No elevated JVP or bruits. RRR,, no gallop or rub.   Abdomen: Normoactive bowel sounds, nontender, nondistended.  Extremities: No pitting edema, distal pulses full.  Neuropsychiatric: Alert and oriented x3, affect appropriate. Anxious.   Lab Results:  Basic Metabolic Panel:  Recent Labs Lab 08/23/14 1723 08/24/14 0455  NA 139 138  K 3.8 3.6  CL 104 103  CO2 26 29  GLUCOSE 104* 81  BUN 15 15  CREATININE 0.79 0.71  CALCIUM 9.8 9.0    CBC:  Recent Labs Lab 08/23/14 1723 08/24/14 0455  WBC 5.7 4.9  HGB 12.3 11.4*  HCT 38.4 35.1*  MCV 93.2 94.6  PLT 199 184    Cardiac Enzymes:  Recent Labs Lab 08/23/14 2301 08/24/14 0122 08/24/14 0455  TROPONINI <0.03 <0.03 <0.03           Radiology: Dg Chest Portable 1 View  08/23/2014   CLINICAL DATA:  Central chest pain for 2 hr.  EXAM: PORTABLE CHEST - 1 VIEW  COMPARISON:  12/04/2011  FINDINGS: The heart is mildly enlarged but stable. Stable pacer wires. There is tortuosity, ectasia and  calcification of the thoracic aorta. The lungs are grossly clear. Low lung volumes with mild vascular crowding and streaky atelectasis. No pleural effusion. The bony thorax is intact.  IMPRESSION: Stable cardiac enlargement.  Low lung volumes with vascular crowding and atelectasis.   Electronically Signed   By: Kalman Jewels M.D.   On: 08/23/2014 17:25     Medications:   Scheduled Medications: . ALPRAZolam  1 mg Oral QHS  . amiodarone  100 mg Oral Daily  . antiseptic oral rinse  7 mL Mouth Rinse BID  . aspirin EC  81 mg Oral Daily  . clopidogrel  75 mg Oral QAC breakfast  . furosemide  60 mg Oral Daily  . heparin  5,000 Units Subcutaneous 3 times per day  . irbesartan  75 mg Oral Daily  . metoprolol succinate  75 mg Oral Q breakfast  . pantoprazole  80 mg Oral Q1200  . potassium chloride SA  20 mEq Oral Daily  . primidone  50 mg Oral QHS  . simvastatin  5 mg Oral QHS  . sodium chloride  3 mL Intravenous Q12H    PRN Medications: acetaminophen, gi cocktail, morphine injection, ondansetron (ZOFRAN) IV, regadenoson, sodium chloride   Assessment and Plan:   1. Chest Pain:  Undergoing stress test today. Assessed prior to [facility]. She is anxious and nervous feeling some chest pressure. No acute ST-T wave changes are noted during EKG portion of the test. If negative, patient could be potentially discharged.  2. CAD: S/P DES X 2 to the RCA in 2010. Continue DAPT, ARB and BB.   3. Hypertension: Well controlled despite her anxiety. Continue current regimen.   4. Tachy/Brady syndrome: Currently on amiodarone with metoprolol. Heart rate is well controlled.   5. Pacemaker in situ: Barry, placed in 2010.  Phill Myron. Lawrence NP [place]  08/25/2014, 10:47 AM    Attending note:  Patient seen and examined. Please refer to consultation note from yesterday. Discussed case with Ms. Lawrence NP. Ms. Pomerleau has had intermittent episodes of atypical chest pain. Cardiac markers have been  normal. She underwent Lexiscan Cardiolite this morning, results pending. If this study is low risk, I would recommend continuing medical therapy. Otherwise we can discuss possible cardiac catheterization.  Satira Sark, M.D., F.A.C.C.

## 2014-09-09 DIAGNOSIS — I509 Heart failure, unspecified: Secondary | ICD-10-CM | POA: Diagnosis not present

## 2014-09-13 ENCOUNTER — Encounter: Payer: Self-pay | Admitting: Internal Medicine

## 2014-09-13 ENCOUNTER — Encounter: Payer: Medicare Other | Admitting: Physician Assistant

## 2014-09-19 ENCOUNTER — Encounter: Payer: Medicare Other | Admitting: Adult Health

## 2014-09-22 ENCOUNTER — Other Ambulatory Visit: Payer: Self-pay | Admitting: Adult Health

## 2014-09-22 ENCOUNTER — Encounter: Payer: Self-pay | Admitting: Adult Health

## 2014-09-22 ENCOUNTER — Ambulatory Visit (INDEPENDENT_AMBULATORY_CARE_PROVIDER_SITE_OTHER): Payer: Medicare Other | Admitting: Adult Health

## 2014-09-22 ENCOUNTER — Ambulatory Visit (INDEPENDENT_AMBULATORY_CARE_PROVIDER_SITE_OTHER): Payer: Medicare Other | Admitting: *Deleted

## 2014-09-22 VITALS — BP 134/76 | HR 89 | Ht 66.0 in | Wt 158.0 lb

## 2014-09-22 DIAGNOSIS — R079 Chest pain, unspecified: Secondary | ICD-10-CM | POA: Diagnosis not present

## 2014-09-22 DIAGNOSIS — I2510 Atherosclerotic heart disease of native coronary artery without angina pectoris: Secondary | ICD-10-CM | POA: Diagnosis not present

## 2014-09-22 DIAGNOSIS — Z5181 Encounter for therapeutic drug level monitoring: Secondary | ICD-10-CM | POA: Diagnosis not present

## 2014-09-22 DIAGNOSIS — Z01818 Encounter for other preprocedural examination: Secondary | ICD-10-CM

## 2014-09-22 DIAGNOSIS — I10 Essential (primary) hypertension: Secondary | ICD-10-CM | POA: Diagnosis not present

## 2014-09-22 DIAGNOSIS — Z950 Presence of cardiac pacemaker: Secondary | ICD-10-CM | POA: Diagnosis not present

## 2014-09-22 DIAGNOSIS — I495 Sick sinus syndrome: Secondary | ICD-10-CM | POA: Diagnosis not present

## 2014-09-22 DIAGNOSIS — R0602 Shortness of breath: Secondary | ICD-10-CM | POA: Diagnosis not present

## 2014-09-22 LAB — MDC_IDC_ENUM_SESS_TYPE_INCLINIC
Battery Remaining Longevity: 100 mo
Battery Voltage: 2.79 V
Lead Channel Impedance Value: 413 Ohm
Lead Channel Impedance Value: 515 Ohm
Lead Channel Pacing Threshold Amplitude: 0.75 V
Lead Channel Pacing Threshold Pulse Width: 0.4 ms
Lead Channel Pacing Threshold Pulse Width: 0.4 ms
Lead Channel Sensing Intrinsic Amplitude: 1 mV
Lead Channel Sensing Intrinsic Amplitude: 8 mV
Lead Channel Setting Pacing Amplitude: 2 V
Lead Channel Setting Sensing Sensitivity: 2.8 mV
MDC IDC MSMT BATTERY IMPEDANCE: 347 Ohm
MDC IDC MSMT LEADCHNL RA PACING THRESHOLD AMPLITUDE: 0.5 V
MDC IDC SESS DTM: 20160219153451
MDC IDC SET LEADCHNL RV PACING AMPLITUDE: 2.5 V
MDC IDC SET LEADCHNL RV PACING PULSEWIDTH: 0.4 ms
MDC IDC STAT BRADY AP VP PERCENT: 0 %
MDC IDC STAT BRADY AP VS PERCENT: 92 %
MDC IDC STAT BRADY AS VP PERCENT: 0 %
MDC IDC STAT BRADY AS VS PERCENT: 8 %

## 2014-09-22 NOTE — Assessment & Plan Note (Signed)
Checked today, functioning inappropriately.  She will need to be seen in 6 months.  A fungal will be made.

## 2014-09-22 NOTE — Progress Notes (Deleted)
Name: [name]    DOB: [birth year]  Age: 79 y.o.  MR#: [id number]       PCP:  Delphina Cahill, MD      Insurance: Payor: [employer] MEDICARE / Plan: [facility] MEDICARE / Product Type: *No Product type* /   CC:    Chief Complaint  Patient presents with  . Atrial Fibrillation  . Coronary Artery Disease  . Tachycardia    VS Filed Vitals:   09/22/14 1343  BP: 134/76  Pulse: 89  Height: 5\' 6"  (1.676 m)  Weight: 158 lb (71.668 kg)  SpO2: 96%    Weights Current Weight  09/22/14 158 lb (71.668 kg)  08/25/14 156 lb 8.4 oz (71 kg)  05/08/14 163 lb (73.936 kg)    Blood Pressure  BP Readings from Last 3 Encounters:  09/22/14 134/76  08/25/14 158/85  05/08/14 154/86     Admit date:  (Not on file) Last encounter with RMR:  Visit date not found   Allergy Amitriptyline hcl and Sulfonamide derivatives  Current Outpatient Prescriptions  Medication Sig Dispense Refill  . ALPRAZolam (XANAX) 1 MG tablet Take 1 mg by mouth at bedtime.    Marland Kitchen amiodarone (PACERONE) 100 MG tablet Take 1 tablet (100 mg total) by mouth daily. (Patient taking differently: Take 200 mg by mouth daily. ) 90 tablet 3  . aspirin EC 81 MG tablet Take 81 mg by mouth daily.    . clopidogrel (PLAVIX) 75 MG tablet Take 75 mg by mouth every morning.    . Cyanocobalamin (B-12 PO) Take 1 tablet by mouth daily as needed (for supplement).    Marland Kitchen esomeprazole (NEXIUM) 40 MG capsule Take 40 mg by mouth daily.     . furosemide (LASIX) 40 MG tablet Take 60 mg by mouth daily. PT ALTERNATES DOSAGE DEPENDING ON SWELLING    . isosorbide mononitrate (IMDUR) 30 MG 24 hr tablet Take 0.5 tablets (15 mg total) by mouth daily. 30 tablet 0  . metoprolol (TOPROL-XL) 50 MG 24 hr tablet Take 75 mg by mouth every morning.     . Multiple Vitamin (MULTIVITAMIN) tablet Take 1 tablet by mouth daily.    . nitroGLYCERIN (NITROSTAT) 0.4 MG SL tablet Place 0.4 mg under the tongue. Take as directed for chest pain     . potassium chloride SA (K-DUR,KLOR-CON)  20 MEQ tablet Take 20 mEq by mouth daily. **TAKES ONE TABLET DAILY BUT IF FLUID RETENTION INCREASES PATIENT TAKES SECOND TABLET FOR CRAMPING    . primidone (MYSOLINE) 50 MG tablet Take 50 mg by mouth at bedtime. *MAY TAKE TWICE DAILY IF TREMORS INCREASE    . simvastatin (ZOCOR) 5 MG tablet Take 5 mg by mouth at bedtime.    . thiamine (VITAMIN B-1) 100 MG tablet Take 100 mg by mouth daily.    . valsartan (DIOVAN) 40 MG tablet Take 40 mg by mouth every evening.    Marland Kitchen VITAMIN D, CHOLECALCIFEROL, PO Take 1 capsule by mouth daily.     . vitamin E 400 UNIT capsule Take 400 Units by mouth daily.     No current facility-administered medications for this visit.    Discontinued Meds:   There are no discontinued medications.  Patient Active Problem List   Diagnosis Date Noted  . Chest pain 08/23/2014  . Diastolic dysfunction with chronic heart failure 08/23/2014  . PERICARDIAL EFFUSION 07/10/2009  . Essential hypertension, benign 04/18/2009  . CORONARY ATHEROSCLEROSIS NATIVE CORONARY ARTERY 04/18/2009  . PPM-Medtronic 04/04/2009  . BRADYCARDIA-TACHYCARDIA SYNDROME  01/26/2009  . HLD (hyperlipidemia) 12/19/2008  . Anxiety state 12/19/2008  . Atrial fibrillation 12/19/2008  . GERD 12/19/2008  . Abnormal involuntary movement 12/19/2008    LABS    Component Value Date/Time   NA 138 08/24/2014 0455   NA 139 08/23/2014 1723   NA 143 06/24/2012 0840   K 3.6 08/24/2014 0455   K 3.8 08/23/2014 1723   K 4.7 06/24/2012 0840   CL 103 08/24/2014 0455   CL 104 08/23/2014 1723   CL 106 06/24/2012 0840   CO2 29 08/24/2014 0455   CO2 26 08/23/2014 1723   CO2 28 06/24/2012 0840   GLUCOSE 81 08/24/2014 0455   GLUCOSE 104* 08/23/2014 1723   GLUCOSE 79 06/24/2012 0840   BUN 15 08/24/2014 0455   BUN 15 08/23/2014 1723   BUN 7 06/24/2012 0840   CREATININE 0.71 08/24/2014 0455   CREATININE 0.79 08/23/2014 1723   CREATININE 0.62 06/24/2012 0840   CREATININE 0.76 02/02/2012 1359   CREATININE 0.68  01/07/2012 1148   CREATININE 0.58 11/18/2011 0545   CALCIUM 9.0 08/24/2014 0455   CALCIUM 9.8 08/23/2014 1723   CALCIUM 9.6 06/24/2012 0840   GFRNONAA 82* 08/24/2014 0455   GFRNONAA 79* 08/23/2014 1723   GFRNONAA 89* 11/18/2011 0545   GFRAA >90 08/24/2014 0455   GFRAA >90 08/23/2014 1723   GFRAA >90 11/18/2011 0545   CMP     Component Value Date/Time   NA 138 08/24/2014 0455   K 3.6 08/24/2014 0455   CL 103 08/24/2014 0455   CO2 29 08/24/2014 0455   GLUCOSE 81 08/24/2014 0455   BUN 15 08/24/2014 0455   CREATININE 0.71 08/24/2014 0455   CREATININE 0.62 06/24/2012 0840   CALCIUM 9.0 08/24/2014 0455   PROT 6.9 08/24/2014 0455   ALBUMIN 3.7 08/24/2014 0455   AST 22 08/24/2014 0455   ALT 19 08/24/2014 0455   ALKPHOS 73 08/24/2014 0455   BILITOT 0.6 08/24/2014 0455   GFRNONAA 82* 08/24/2014 0455   GFRAA >90 08/24/2014 0455       Component Value Date/Time   WBC 4.9 08/24/2014 0455   WBC 5.7 08/23/2014 1723   WBC 4.8 11/17/2011 0520   HGB 11.4* 08/24/2014 0455   HGB 12.3 08/23/2014 1723   HGB 11.7* 11/17/2011 0520   HCT 35.1* 08/24/2014 0455   HCT 38.4 08/23/2014 1723   HCT 36.9 11/17/2011 0520   MCV 94.6 08/24/2014 0455   MCV 93.2 08/23/2014 1723   MCV 87.4 11/17/2011 0520    Lipid Panel     Component Value Date/Time   CHOL 242* 11/15/2011 0640   TRIG 152* 11/15/2011 0640   HDL 46 11/15/2011 0640   CHOLHDL 5.3 11/15/2011 0640   VLDL 30 11/15/2011 0640   LDLCALC 166* 11/15/2011 0640    ABG    Component Value Date/Time   PHART 7.441* 06/13/2009 0442   PCO2ART 38.3 06/13/2009 0442   PO2ART 60.0* 06/13/2009 0442   HCO3 25.9* 06/13/2009 0442   TCO2 27 06/13/2009 0442   O2SAT 91.0 06/13/2009 0442     Lab Results  Component Value Date   TSH 1.092 11/15/2011   BNP (last 3 results) No results for input(s): BNP in the last 8760 hours.  ProBNP (last 3 results) No results for input(s): PROBNP in the last 8760 hours.  Cardiac Panel (last 3 results) No  results for input(s): CKTOTAL, CKMB, TROPONINI, RELINDX in the last 72 hours.  Iron/TIBC/Ferritin/ %Sat    Component Value Date/Time   IRON  52 06/03/2011 1116   TIBC 413 06/03/2011 1116   FERRITIN 16 06/23/2011 1603   IRONPCTSAT 13* 06/03/2011 1116     EKG Orders placed or performed during the hospital encounter of 08/23/14  . ED EKG  . ED EKG  . EKG 12-Lead (at 6am)  . EKG 12-Lead (at 6am)  . EKG 12-Lead  . EKG  . EKG 12-Lead  . EKG 12-Lead  . EKG 12-Lead     Prior Assessment and Plan Problem List as of 09/22/2014      Cardiovascular and Mediastinum   Essential hypertension, benign   Last Assessment & Plan 05/08/2014 Office Visit Written 05/08/2014 11:50 AM by Satira Sark, MD    No change in current medication, keep followup with Dr. [place] Crane.      CORONARY ATHEROSCLEROSIS NATIVE CORONARY ARTERY   Last Assessment & Plan 05/08/2014 Office Visit Written 05/08/2014 11:50 AM by Satira Sark, MD    No active angina symptoms on medical therapy. Continue observation.      PERICARDIAL EFFUSION   Last Assessment & Plan 03/25/2011 Office Visit Written 03/25/2011 10:48 AM by Satira Sark, MD    History of hemorrhagic pericardial effusion status post pericardial window. Followup echocardiogram planned.      Atrial fibrillation   Last Assessment & Plan 05/08/2014 Office Visit Written 05/08/2014 11:49 AM by Satira Sark, MD    Has maintained sinus rhythm fairly well. She is not on anticoagulant with history of hemorrhagic pericardial effusion. Has been able to tolerate aspirin and Plavix.      BRADYCARDIA-TACHYCARDIA SYNDROME   Last Assessment & Plan 05/08/2014 Office Visit Written 05/08/2014 11:50 AM by Satira Sark, MD    Status post Medtronic pacemaker, followed by Dr. Lovena Le.      Diastolic dysfunction with chronic heart failure     Digestive   GERD     Nervous and Auditory   Abnormal involuntary movement     Other   HLD (hyperlipidemia)   Last  Assessment & Plan 09/24/2011 Office Visit Written 09/24/2011 10:22 AM by Satira Sark, MD    Due for followup FLP.      Anxiety state   PPM-Medtronic   Last Assessment & Plan 09/02/2013 Office Visit Written 09/02/2013 10:07 AM by Evans Lance, MD    Her Medtronic dual-chamber pacemaker is working normally and has approximately 9 years of battery longevity. We'll plan to recheck in several months.      Chest pain       Imaging: Nm Myocar Multi W/spect W/wall Motion / Ef  08/25/2014   CLINICAL DATA:  79 year old woman with CAD status post DES x 2 to the RCA in 2010, atrial fibrillation with tachycardia-bradycardia syndrome status post pacemaker placement, hyperlipidemia, COPD, anxiety, and hypertension. She presents with chest discomfort and has ruled out for myocardial infarction. This study is requested to evaluate for the presence and extent of ischemia.  EXAM: MYOCARDIAL IMAGING WITH SPECT (REST AND PHARMACOLOGIC-STRESS)  GATED LEFT VENTRICULAR WALL MOTION STUDY  LEFT VENTRICULAR EJECTION FRACTION  TECHNIQUE: Standard myocardial SPECT imaging was performed after resting intravenous injection of 10 mCi Tc-26m sestamibi. Subsequently, intravenous infusion of Lexiscan was performed under the supervision of the Cardiology staff. At peak effect of the drug, 30 mCi Tc-39m sestamibi was injected intravenously and standard myocardial SPECT imaging was performed. Quantitative gated imaging was also performed to evaluate left ventricular wall motion, and estimate left ventricular ejection fraction.  FINDINGS: Baseline ECG shows atrial paced rhythm  at 60 beats per min. Lexiscan bolus was given in standard fashion. Heart rate increased from 60 beats per min up to 75 beats per min, and blood pressure increased from 127/76 up to 131/80. Patient tolerated infusion well. There were no diagnostic ST segment abnormalities noted. Rare PVC was seen.  Analysis of the overall perfusion data finds adequate myocardial  radiotracer uptake.  Perfusion: There is a small, mild intensity, apical to basal inferolateral defect that exhibits partial reversibility. This is most consistent with scar and mild peri-infarct ischemia.  Wall Motion: Normal left ventricular wall motion. No left ventricular dilation.  Left Ventricular Ejection Fraction: 56 %  End diastolic volume 55 ml  End systolic volume 24 ml  IMPRESSION: 1. Small region of scar with mild peri-infarct ischemia in the inferolateral wall.  2. Normal left ventricular wall motion.  3. Left ventricular ejection fraction 56%  4. Low-risk stress test findings*.  *2012 Appropriate Use Criteria for Coronary Revascularization Focused Update: J Am Coll Cardiol. 2725;36(6):440-347. http://content.airportbarriers.com.aspx?articleid=1201161   Electronically Signed   By: Rozann Lesches M.D.   On: 08/25/2014 12:00   Dg Chest Portable 1 View  08/23/2014   CLINICAL DATA:  Central chest pain for 2 hr.  EXAM: PORTABLE CHEST - 1 VIEW  COMPARISON:  12/04/2011  FINDINGS: The heart is mildly enlarged but stable. Stable pacer wires. There is tortuosity, ectasia and calcification of the thoracic aorta. The lungs are grossly clear. Low lung volumes with mild vascular crowding and streaky atelectasis. No pleural effusion. The bony thorax is intact.  IMPRESSION: Stable cardiac enlargement.  Low lung volumes with vascular crowding and atelectasis.   Electronically Signed   By: Kalman Jewels M.D.   On: 08/23/2014 17:25

## 2014-09-22 NOTE — Progress Notes (Addendum)
Cardiology Office Note   Date:  09/22/2014   ID:  [name], DOB 23-Nov-1935, MRN [id number]  PCP:  Delphina Cahill, MD  Cardiologist:  Delman Cheadle, NP   Chief Complaint  Patient presents with  . Atrial Fibrillation  . Coronary Artery Disease  . Tachycardia      History of Present Illness: [name] is a 79 y.o. female who presents for ongoing assessment and management of CAD, tachybradycardia syndrome, status post pacemaker, on anticoagulation, secondary to history of hemorrhagic pericarditis, and atrial fibrillation.  The patient was recently discharged from the hospital on 08/25/2014 after admission for chest pain, hypertension, anxiety.  The patient was recommended for medical management after having a normal LexiScan Myoview completed.  During that admission.  She is here for followup to discuss current symptoms.      She comes today with multiple somatic complaints and ongoing chest pain.  Coming on and off mostly on the left.  She also states that her breathing has worsened.  She wants to proceed with cardiac catheterization as discussed by Dr. Domenic Polite during recent hospitalization. His notes, stated that if chest pain, was recurrent, he would consider cardiac catheterization.  She would like to have his mind knowing that she does not have any new blockages She is worried that the known blockages are  causing her current symptoms.  The patient also talks about her family issues and has become very nervous and anxious concerning them.  She is also complaining of dyspnea on exertion, which she believes has worsened since being in the hospital.    She is requesting that her pacemaker be rechecked.  She has missed several appointments for pacemaker interrogation .  Last office visit was in October of last year when it was interrogated and performed well.  She also would like to be seen by Dr. Lovena Le on followup appointments.           Past Medical History  Diagnosis  Date  . Atrial fibrillation   . Coronary atherosclerosis of native coronary artery     DES x 2 to RCA 10/10  . GERD (gastroesophageal reflux disease)   . Tachycardia-bradycardia syndrome     s/p Medtronic Adapta L dual chamber device  5/10  . Hyperlipidemia   . COPD (chronic obstructive pulmonary disease)   . Anxiety   . Resting tremor   . 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 Chronic back pain   . Pericardial effusion     Hemorrhagic   . Essential hypertension, benign   . Headache(784.0)   . H/O hiatal hernia   . Neuromuscular disorder     Tremors    Past Surgical History  Procedure Laterality Date  . Cholecystectomy    . Appendectomy    . Subxiphoid pericardial window  11/10  . Esophagogastroduodenoscopy with esophageal dilation  2004, 2006, 2007  . Vaginal hysterectomy    . Colonoscopy  2011  . Right rotator cuff repair    . Insert / replace / remove pacemaker    . Left heart catheterization with coronary angiogram N/A 11/17/2011    Procedure: LEFT HEART CATHETERIZATION WITH CORONARY ANGIOGRAM;  Surgeon: Sherren Mocha, MD;  Location: [facility] CATH LAB;  Service: Cardiovascular;  Laterality: N/A;     Current Outpatient Prescriptions  Medication Sig Dispense Refill  . ALPRAZolam (XANAX) 1 MG tablet Take 1 mg by mouth at bedtime.    Marland Kitchen amiodarone (PACERONE) 100 MG  tablet Take 1 tablet (100 mg total) by mouth daily. (Patient taking differently: Take 200 mg by mouth daily. ) 90 tablet 3  . aspirin EC 81 MG tablet Take 81 mg by mouth daily.    . clopidogrel (PLAVIX) 75 MG tablet Take 75 mg by mouth every morning.    . Cyanocobalamin (B-12 PO) Take 1 tablet by mouth daily as needed (for supplement).    Marland Kitchen esomeprazole (NEXIUM) 40 MG capsule Take 40 mg by mouth daily.     . furosemide (LASIX) 40 MG tablet Take 60 mg by mouth daily. PT ALTERNATES DOSAGE DEPENDING ON SWELLING    . isosorbide mononitrate (IMDUR) 30 MG 24 hr tablet Take 0.5 tablets (15 mg total) by  mouth daily. 30 tablet 0  . metoprolol (TOPROL-XL) 50 MG 24 hr tablet Take 75 mg by mouth every morning.     . Multiple Vitamin (MULTIVITAMIN) tablet Take 1 tablet by mouth daily.    . nitroGLYCERIN (NITROSTAT) 0.4 MG SL tablet Place 0.4 mg under the tongue. Take as directed for chest pain     . potassium chloride SA (K-DUR,KLOR-CON) 20 MEQ tablet Take 20 mEq by mouth daily. **TAKES ONE TABLET DAILY BUT IF FLUID RETENTION INCREASES PATIENT TAKES SECOND TABLET FOR CRAMPING    . primidone (MYSOLINE) 50 MG tablet Take 50 mg by mouth at bedtime. *MAY TAKE TWICE DAILY IF TREMORS INCREASE    . simvastatin (ZOCOR) 5 MG tablet Take 5 mg by mouth at bedtime.    . thiamine (VITAMIN B-1) 100 MG tablet Take 100 mg by mouth daily.    . valsartan (DIOVAN) 40 MG tablet Take 40 mg by mouth every evening.    Marland Kitchen VITAMIN D, CHOLECALCIFEROL, PO Take 1 capsule by mouth daily.     . vitamin E 400 UNIT capsule Take 400 Units by mouth daily.     No current facility-administered medications for this visit.    Allergies:   Amitriptyline hcl and Sulfonamide derivatives    Social History:  The patient  reports that she has never smoked. She has never used smokeless tobacco. She reports that she does not drink alcohol or use illicit drugs.   Family History:  The patient's family history includes Arthritis in an other family member; Asthma in an other family member; Cancer in her mother; Coronary artery disease in her brother and sister; Lung disease in an other family member.    ROS:  Please see the history of present illness.  All other systems are reviewed and negative.    PHYSICAL EXAM: VS:  BP 134/76 mmHg  Pulse 89  Ht 5\' 6"  (1.676 m)  Wt 158 lb (71.668 kg)  BMI 25.51 kg/m2  SpO2 96% , BMI Body mass index is 25.51 kg/(m^2). GEN: Well nourished, well developed, in no acute distress HEENT: normal Neck: no JVD, carotid bruits, or masses Cardiac: RRR; no murmurs, rubs, or gallops,no edema  Respiratory:  clear  to auscultation bilaterally, normal work of breathing GI: soft, nontender, nondistended, + BS MS: no deformity or atrophy Skin: warm and dry, no rash Neuro:  Strength and sensation are intact, essential tremors. Psych: euthymic mood, full affect  Recent Labs: 08/24/2014: ALT 19; BUN 15; Creatinine 0.71; Hemoglobin 11.4*; Platelets 184; Potassium 3.6; Sodium 138    Lipid Panel    Component Value Date/Time   CHOL 242* 11/15/2011 0640   TRIG 152* 11/15/2011 0640   HDL 46 11/15/2011 0640   CHOLHDL 5.3 11/15/2011 0640   VLDL 30  11/15/2011 0640   LDLCALC 166* 11/15/2011 0640      Wt Readings from Last 3 Encounters:  09/22/14 158 lb (71.668 kg)  08/25/14 156 lb 8.4 oz (71 kg)  05/08/14 163 lb (73.936 kg)      Other studies Reviewed: Additional studies/ records that were reviewed today include: Pre-cardiac catheterization orders.  Pacemaker interrogation. Review of the above records demonstrates:normal pacemaker function.  A follow up appointment with Dr. Lovena Le is made   ASSESSMENT AND PLAN:  1.  CAD: Status post stents of the right coronary artery placed in 2010.  Recurrent chest pain with worsening dyspnea on exertion.  Recent hospitalization in January 2016, LexiScan Myoview was found to be low risk.  EF of 56%.  The patient wishes to proceed with cardiac catheterization for definitive evaluation of coronary anatomy with known history of CAD, and recurrent and ongoing symptoms.  Cardiac catheterization will be completed on thyroid 23rd 2060 at 12 noon with Dr. Ellyn Hack.  2. Medtronic pacemaker in situ: Pacemaker has been interrogated here in the office.  They will be functioning appropriately.  Reassurance was given.  3.  Chronic dyspnea on exertion: Would recommend PFTs if coronary anatomy has not changed during needed.  For PCI.  The patient states she has home oxygen, which he uses on occasion.  This will need to be evaluated further once catheterization is completed.  4.  Orthostatic hypotension.  Blood pressure decreased from 132/76 to 116/68. Heart rate remained stable.  Would consider decreasing Lasix and/or indoor.  She has not was to change any medicines until after catheterization.  She is taking Lasix on a daily basis on an as needed.  Dosing..  There are days when she takes 40 mg, 60 mg, and sometimes it 80 mg if she begins retaining fluid.  I am concerned that she may have mild dehydration with her the crease in blood pressure during standing.  Would like her to decrease her Lasix to 40 mg daily without higher doses until labs are available.  She verbalizes understanding.   Current medicines are reviewed at length with the patient today.  The patient does not haveconcerns regarding medicines.  The following changes have been made:  No changes at this time. Labs/ tests ordered today include: cardiac catheterization, pre-cardiac catheterization orders, amiodarone level.No orders of the defined types were placed in this encounter.     Disposition:   FU with post cardiac catheterization. Signed, Katrina Sims, NP  09/22/2014 2:11 PM    [facility], [place], [place]  [address] Phone: [phone]; Fax: [phone]

## 2014-09-22 NOTE — Progress Notes (Signed)
PPM check in office. 

## 2014-09-22 NOTE — Assessment & Plan Note (Signed)
Cardiac catheterization is ordered as described above.

## 2014-09-22 NOTE — Patient Instructions (Signed)
Your physician recommends that you schedule a follow-up appointment in: will be made at hospital after heart catherization   Your physician wants you to follow-up in: 6 months with Dr.Taylor You will receive a reminder letter in the mail two months in advance. If you don't receive a letter, please call our office to schedule the follow-up appointment.    GET BLOOD WORK TODAY ACROSS THE STREET AT [facility] ND FLOOR IN THE [facility] MEDICAL OFFICE BUILDING   Your physician has requested that you have a cardiac catheterization. Cardiac catheterization is used to diagnose and/or treat various heart conditions. Doctors may recommend this procedure for a number of different reasons. The most common reason is to evaluate chest pain. Chest pain can be a symptom of coronary artery disease (CAD), and cardiac catheterization can show whether plaque is narrowing or blocking your heart's arteries. This procedure is also used to evaluate the valves, as well as measure the blood flow and oxygen levels in different parts of your heart. For further information please visit HugeFiesta.[email or web address]. Please follow instruction sheet, as given.     Thank you for choosing [facility] !

## 2014-09-23 LAB — CBC
HCT: 34.1 % — ABNORMAL LOW (ref 36.0–46.0)
Hemoglobin: 11.1 g/dL — ABNORMAL LOW (ref 12.0–15.0)
MCH: 29.7 pg (ref 26.0–34.0)
MCHC: 32.6 g/dL (ref 30.0–36.0)
MCV: 91.2 fL (ref 78.0–100.0)
MPV: 9.6 fL (ref 8.6–12.4)
PLATELETS: 217 10*3/uL (ref 150–400)
RBC: 3.74 MIL/uL — ABNORMAL LOW (ref 3.87–5.11)
RDW: 14.1 % (ref 11.5–15.5)
WBC: 5.2 10*3/uL (ref 4.0–10.5)

## 2014-09-23 LAB — BASIC METABOLIC PANEL
BUN: 15 mg/dL (ref 6–23)
CHLORIDE: 106 meq/L (ref 96–112)
CO2: 26 meq/L (ref 19–32)
Calcium: 9.3 mg/dL (ref 8.4–10.5)
Creat: 0.94 mg/dL (ref 0.50–1.10)
GLUCOSE: 87 mg/dL (ref 70–99)
POTASSIUM: 4.1 meq/L (ref 3.5–5.3)
Sodium: 140 mEq/L (ref 135–145)

## 2014-09-23 LAB — AMIODARONE LEVEL
Amiodarone Lvl: 1.1 ug/mL — ABNORMAL LOW (ref 1.5–2.5)
DESETHYLAMIODARONE: 0.7 ug/mL — AB (ref 1.5–2.5)

## 2014-09-23 LAB — PROTIME-INR
INR: 0.97 (ref <1.50)
PROTHROMBIN TIME: 12.9 s (ref 11.6–15.2)

## 2014-09-26 ENCOUNTER — Encounter (HOSPITAL_COMMUNITY): Payer: Self-pay | Admitting: *Deleted

## 2014-09-26 ENCOUNTER — Ambulatory Visit (HOSPITAL_COMMUNITY)
Admission: RE | Admit: 2014-09-26 | Discharge: 2014-09-26 | Disposition: A | Discharge status: Home / Self Care | Payer: Medicare Other | Source: Ambulatory Visit | Attending: Cardiology | Admitting: Cardiology

## 2014-09-26 ENCOUNTER — Encounter (HOSPITAL_COMMUNITY)
Admission: RE | Disposition: A | Discharge status: Home / Self Care | Payer: Self-pay | Source: Ambulatory Visit | Attending: Cardiology

## 2014-09-26 DIAGNOSIS — R079 Chest pain, unspecified: Secondary | ICD-10-CM | POA: Diagnosis present

## 2014-09-26 DIAGNOSIS — I25119 Atherosclerotic heart disease of native coronary artery with unspecified angina pectoris: Secondary | ICD-10-CM | POA: Diagnosis not present

## 2014-09-26 DIAGNOSIS — F419 Anxiety disorder, unspecified: Secondary | ICD-10-CM | POA: Insufficient documentation

## 2014-09-26 DIAGNOSIS — Z9981 Dependence on supplemental oxygen: Secondary | ICD-10-CM | POA: Diagnosis not present

## 2014-09-26 DIAGNOSIS — I951 Orthostatic hypotension: Secondary | ICD-10-CM | POA: Insufficient documentation

## 2014-09-26 DIAGNOSIS — J449 Chronic obstructive pulmonary disease, unspecified: Secondary | ICD-10-CM | POA: Insufficient documentation

## 2014-09-26 DIAGNOSIS — I2510 Atherosclerotic heart disease of native coronary artery without angina pectoris: Secondary | ICD-10-CM | POA: Diagnosis present

## 2014-09-26 DIAGNOSIS — G8929 Other chronic pain: Secondary | ICD-10-CM | POA: Insufficient documentation

## 2014-09-26 DIAGNOSIS — I25110 Atherosclerotic heart disease of native coronary artery with unstable angina pectoris: Secondary | ICD-10-CM | POA: Diagnosis present

## 2014-09-26 DIAGNOSIS — Z79899 Other long term (current) drug therapy: Secondary | ICD-10-CM | POA: Insufficient documentation

## 2014-09-26 DIAGNOSIS — I10 Essential (primary) hypertension: Secondary | ICD-10-CM | POA: Diagnosis present

## 2014-09-26 DIAGNOSIS — R0600 Dyspnea, unspecified: Secondary | ICD-10-CM | POA: Diagnosis not present

## 2014-09-26 DIAGNOSIS — E785 Hyperlipidemia, unspecified: Secondary | ICD-10-CM | POA: Insufficient documentation

## 2014-09-26 DIAGNOSIS — Z7982 Long term (current) use of aspirin: Secondary | ICD-10-CM | POA: Diagnosis not present

## 2014-09-26 DIAGNOSIS — I241 Dressler's syndrome: Secondary | ICD-10-CM | POA: Insufficient documentation

## 2014-09-26 DIAGNOSIS — K219 Gastro-esophageal reflux disease without esophagitis: Secondary | ICD-10-CM | POA: Diagnosis not present

## 2014-09-26 DIAGNOSIS — I5032 Chronic diastolic (congestive) heart failure: Secondary | ICD-10-CM | POA: Diagnosis present

## 2014-09-26 DIAGNOSIS — Z9889 Other specified postprocedural states: Secondary | ICD-10-CM | POA: Diagnosis not present

## 2014-09-26 DIAGNOSIS — M549 Dorsalgia, unspecified: Secondary | ICD-10-CM | POA: Insufficient documentation

## 2014-09-26 DIAGNOSIS — I4891 Unspecified atrial fibrillation: Secondary | ICD-10-CM | POA: Insufficient documentation

## 2014-09-26 DIAGNOSIS — Z950 Presence of cardiac pacemaker: Secondary | ICD-10-CM | POA: Diagnosis not present

## 2014-09-26 DIAGNOSIS — Z7902 Long term (current) use of antithrombotics/antiplatelets: Secondary | ICD-10-CM | POA: Insufficient documentation

## 2014-09-26 HISTORY — PX: LEFT HEART CATHETERIZATION WITH CORONARY ANGIOGRAM: SHX5451

## 2014-09-26 SURGERY — LEFT HEART CATHETERIZATION WITH CORONARY ANGIOGRAM
Anesthesia: LOCAL

## 2014-09-26 MED ORDER — LIDOCAINE HCL (PF) 1 % IJ SOLN
INTRAMUSCULAR | Status: AC
  Filled 2014-09-26: qty 30

## 2014-09-26 MED ORDER — SODIUM CHLORIDE 0.9 % IJ SOLN: 3.0000 mL | INTRAMUSCULAR | Status: DC | PRN

## 2014-09-26 MED ORDER — SODIUM CHLORIDE 0.9 % IV SOLN: 1.0000 mL/kg/h | INTRAVENOUS | Status: DC

## 2014-09-26 MED ORDER — VERAPAMIL HCL 2.5 MG/ML IV SOLN
INTRAVENOUS | Status: AC
  Filled 2014-09-26: qty 2

## 2014-09-26 MED ORDER — MORPHINE SULFATE 2 MG/ML IJ SOLN: 1.0000 mg | INTRAMUSCULAR | Status: DC | PRN

## 2014-09-26 MED ORDER — NITROGLYCERIN 1 MG/10 ML FOR IR/CATH LAB
INTRA_ARTERIAL | Status: AC
  Filled 2014-09-26: qty 10

## 2014-09-26 MED ORDER — HEPARIN SODIUM (PORCINE) 1000 UNIT/ML IJ SOLN
INTRAMUSCULAR | Status: AC
  Filled 2014-09-26: qty 1

## 2014-09-26 MED ORDER — ACETAMINOPHEN 325 MG PO TABS: 650.0000 mg | ORAL_TABLET | ORAL | Status: DC | PRN

## 2014-09-26 MED ORDER — MIDAZOLAM HCL 2 MG/2ML IJ SOLN
INTRAMUSCULAR | Status: AC
  Filled 2014-09-26: qty 2

## 2014-09-26 MED ORDER — HEPARIN (PORCINE) IN NACL 2-0.9 UNIT/ML-% IJ SOLN
INTRAMUSCULAR | Status: AC
  Filled 2014-09-26: qty 1000

## 2014-09-26 MED ORDER — SODIUM CHLORIDE 0.9 % IJ SOLN: 3.0000 mL | Freq: Two times a day (BID) | INTRAMUSCULAR | Status: DC

## 2014-09-26 MED ORDER — SODIUM CHLORIDE 0.9 % IV SOLN
INTRAVENOUS | Status: DC
  Administered 2014-09-26: 10:00:00 via INTRAVENOUS

## 2014-09-26 MED ORDER — ASPIRIN 81 MG PO CHEW: 81.0000 mg | CHEWABLE_TABLET | ORAL | Status: DC

## 2014-09-26 MED ORDER — FENTANYL CITRATE 0.05 MG/ML IJ SOLN
INTRAMUSCULAR | Status: AC
  Filled 2014-09-26: qty 2

## 2014-09-26 MED ORDER — SODIUM CHLORIDE 0.9 % IV SOLN: 250.0000 mL | INTRAVENOUS | Status: DC | PRN

## 2014-09-26 MED ORDER — ONDANSETRON HCL 4 MG/2ML IJ SOLN: 4.0000 mg | Freq: Four times a day (QID) | INTRAMUSCULAR | Status: DC | PRN

## 2014-09-26 NOTE — Discharge Instructions (Signed)
Radial Site Care °Refer to this sheet in the next few weeks. These instructions provide you with information on caring for yourself after your procedure. Your caregiver may also give you more specific instructions. Your treatment has been planned according to current medical practices, but problems sometimes occur. Call your caregiver if you have any problems or questions after your procedure. °HOME CARE INSTRUCTIONS °· You may shower the day after the procedure. Remove the bandage (dressing) and gently wash the site with plain soap and water. Gently pat the site dry. °· Do not apply powder or lotion to the site. °· Do not submerge the affected site in water for 3 to 5 days. °· Inspect the site at least twice daily. °· Do not flex or bend the affected arm for 24 hours. °· No lifting over 5 pounds (2.3 kg) for 5 days after your procedure. °· Do not drive home if you are discharged the same day of the procedure. Have someone else drive you. °· You may drive 24 hours after the procedure unless otherwise instructed by your caregiver. °· Do not operate machinery or power tools for 24 hours. °· A responsible adult should be with you for the first 24 hours after you arrive home. °What to expect: °· Any bruising will usually fade within 1 to 2 weeks. °· Blood that collects in the tissue (hematoma) may be painful to the touch. It should usually decrease in size and tenderness within 1 to 2 weeks. °SEEK IMMEDIATE MEDICAL CARE IF: °· You have unusual pain at the radial site. °· You have redness, warmth, swelling, or pain at the radial site. °· You have drainage (other than a small amount of blood on the dressing). °· You have chills. °· You have a fever or persistent symptoms for more than 72 hours. °· You have a fever and your symptoms suddenly get worse. °· Your arm becomes pale, cool, tingly, or numb. °· You have heavy bleeding from the site. Hold pressure on the site. °Document Released: 08/23/2010 Document Revised:  10/13/2011 Document Reviewed: 08/23/2010 °[employer]® Patient Information ©2015 [employer]. This information is not intended to replace advice given to you by your health care provider. Make sure you discuss any questions you have with your health care provider. ° °

## 2014-09-26 NOTE — H&P (View-Only) (Signed)
Cardiology Office Note   Date:  09/22/2014   ID:  [name], DOB [birth year], MRN [id number]  PCP:  Delphina Cahill, MD  Cardiologist:  Delman Cheadle, NP   Chief Complaint  Patient presents with  . Atrial Fibrillation  . Coronary Artery Disease  . Tachycardia      History of Present Illness: [name] is a 79 y.o. female who presents for ongoing assessment and management of CAD, tachybradycardia syndrome, status post pacemaker, on anticoagulation, secondary to history of hemorrhagic pericarditis, and atrial fibrillation.  The patient was recently discharged from the hospital on 08/25/2014 after admission for chest pain, hypertension, anxiety.  The patient was recommended for medical management after having a normal LexiScan Myoview completed.  During that admission.  She is here for followup to discuss current symptoms.      She comes today with multiple somatic complaints and ongoing chest pain.  Coming on and off mostly on the left.  She also states that her breathing has worsened.  She wants to proceed with cardiac catheterization as discussed by Dr. Domenic Polite during recent hospitalization. His notes, stated that if chest pain, was recurrent, he would consider cardiac catheterization.  She would like to have his mind knowing that she does not have any new blockages She is worried that the known blockages are  causing her current symptoms.  The patient also talks about her family issues and has become very nervous and anxious concerning them.  She is also complaining of dyspnea on exertion, which she believes has worsened since being in the hospital.    She is requesting that her pacemaker be rechecked.  She has missed several appointments for pacemaker interrogation .  Last office visit was in October of last year when it was interrogated and performed well.  She also would like to be seen by Dr. Lovena Le on followup appointments.           Past Medical History  Diagnosis  Date  . Atrial fibrillation   . Coronary atherosclerosis of native coronary artery     DES x 2 to RCA 10/10  . GERD (gastroesophageal reflux disease)   . Tachycardia-bradycardia syndrome     s/p Medtronic Adapta L dual chamber device  5/10  . Hyperlipidemia   . COPD (chronic obstructive pulmonary disease)   . Anxiety   . Resting tremor   . 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 Chronic back pain   . Pericardial effusion     Hemorrhagic   . Essential hypertension, benign   . Headache(784.0)   . H/O hiatal hernia   . Neuromuscular disorder     Tremors    Past Surgical History  Procedure Laterality Date  . Cholecystectomy    . Appendectomy    . Subxiphoid pericardial window  11/10  . Esophagogastroduodenoscopy with esophageal dilation  2004, 2006, 2007  . Vaginal hysterectomy    . Colonoscopy  2011  . Right rotator cuff repair    . Insert / replace / remove pacemaker    . Left heart catheterization with coronary angiogram N/A 11/17/2011    Procedure: LEFT HEART CATHETERIZATION WITH CORONARY ANGIOGRAM;  Surgeon: Sherren Mocha, MD;  Location: [facility] CATH LAB;  Service: Cardiovascular;  Laterality: N/A;     Current Outpatient Prescriptions  Medication Sig Dispense Refill  . ALPRAZolam (XANAX) 1 MG tablet Take 1 mg by mouth at bedtime.    Marland Kitchen amiodarone (PACERONE) 100 MG  tablet Take 1 tablet (100 mg total) by mouth daily. (Patient taking differently: Take 200 mg by mouth daily. ) 90 tablet 3  . aspirin EC 81 MG tablet Take 81 mg by mouth daily.    . clopidogrel (PLAVIX) 75 MG tablet Take 75 mg by mouth every morning.    . Cyanocobalamin (B-12 PO) Take 1 tablet by mouth daily as needed (for supplement).    Marland Kitchen esomeprazole (NEXIUM) 40 MG capsule Take 40 mg by mouth daily.     . furosemide (LASIX) 40 MG tablet Take 60 mg by mouth daily. PT ALTERNATES DOSAGE DEPENDING ON SWELLING    . isosorbide mononitrate (IMDUR) 30 MG 24 hr tablet Take 0.5 tablets (15 mg total) by  mouth daily. 30 tablet 0  . metoprolol (TOPROL-XL) 50 MG 24 hr tablet Take 75 mg by mouth every morning.     . Multiple Vitamin (MULTIVITAMIN) tablet Take 1 tablet by mouth daily.    . nitroGLYCERIN (NITROSTAT) 0.4 MG SL tablet Place 0.4 mg under the tongue. Take as directed for chest pain     . potassium chloride SA (K-DUR,KLOR-CON) 20 MEQ tablet Take 20 mEq by mouth daily. **TAKES ONE TABLET DAILY BUT IF FLUID RETENTION INCREASES PATIENT TAKES SECOND TABLET FOR CRAMPING    . primidone (MYSOLINE) 50 MG tablet Take 50 mg by mouth at bedtime. *MAY TAKE TWICE DAILY IF TREMORS INCREASE    . simvastatin (ZOCOR) 5 MG tablet Take 5 mg by mouth at bedtime.    . thiamine (VITAMIN B-1) 100 MG tablet Take 100 mg by mouth daily.    . valsartan (DIOVAN) 40 MG tablet Take 40 mg by mouth every evening.    Marland Kitchen VITAMIN D, CHOLECALCIFEROL, PO Take 1 capsule by mouth daily.     . vitamin E 400 UNIT capsule Take 400 Units by mouth daily.     No current facility-administered medications for this visit.    Allergies:   Amitriptyline hcl and Sulfonamide derivatives    Social History:  The patient  reports that she has never smoked. She has never used smokeless tobacco. She reports that she does not drink alcohol or use illicit drugs.   Family History:  The patient's family history includes Arthritis in an other family member; Asthma in an other family member; Cancer in her mother; Coronary artery disease in her brother and sister; Lung disease in an other family member.    ROS:  Please see the history of present illness.  All other systems are reviewed and negative.    PHYSICAL EXAM: VS:  BP 134/76 mmHg  Pulse 89  Ht 5\' 6"  (1.676 m)  Wt 158 lb (71.668 kg)  BMI 25.51 kg/m2  SpO2 96% , BMI Body mass index is 25.51 kg/(m^2). GEN: Well nourished, well developed, in no acute distress HEENT: normal Neck: no JVD, carotid bruits, or masses Cardiac: RRR; no murmurs, rubs, or gallops,no edema  Respiratory:  clear  to auscultation bilaterally, normal work of breathing GI: soft, nontender, nondistended, + BS MS: no deformity or atrophy Skin: warm and dry, no rash Neuro:  Strength and sensation are intact, essential tremors. Psych: euthymic mood, full affect  Recent Labs: 08/24/2014: ALT 19; BUN 15; Creatinine 0.71; Hemoglobin 11.4*; Platelets 184; Potassium 3.6; Sodium 138    Lipid Panel    Component Value Date/Time   CHOL 242* 11/15/2011 0640   TRIG 152* 11/15/2011 0640   HDL 46 11/15/2011 0640   CHOLHDL 5.3 11/15/2011 0640   VLDL 30  11/15/2011 0640   LDLCALC 166* 11/15/2011 0640      Wt Readings from Last 3 Encounters:  09/22/14 158 lb (71.668 kg)  08/25/14 156 lb 8.4 oz (71 kg)  05/08/14 163 lb (73.936 kg)      Other studies Reviewed: Additional studies/ records that were reviewed today include: Pre-cardiac catheterization orders.  Pacemaker interrogation. Review of the above records demonstrates:normal pacemaker function.  A follow up appointment with Dr. Lovena Le is made   ASSESSMENT AND PLAN:  1.  CAD: Status post stents of the right coronary artery placed in 2010.  Recurrent chest pain with worsening dyspnea on exertion.  Recent hospitalization in January 2016, LexiScan Myoview was found to be low risk.  EF of 56%.  The patient wishes to proceed with cardiac catheterization for definitive evaluation of coronary anatomy with known history of CAD, and recurrent and ongoing symptoms.  Cardiac catheterization will be completed on thyroid 23rd 2060 at 12 noon with Dr. Ellyn Hack.  2. Medtronic pacemaker in situ: Pacemaker has been interrogated here in the office.  They will be functioning appropriately.  Reassurance was given.  3.  Chronic dyspnea on exertion: Would recommend PFTs if coronary anatomy has not changed during needed.  For PCI.  The patient states she has home oxygen, which he uses on occasion.  This will need to be evaluated further once catheterization is completed.  4.  Orthostatic hypotension.  Blood pressure decreased from 132/76 to 116/68. Heart rate remained stable.  Would consider decreasing Lasix and/or indoor.  She has not was to change any medicines until after catheterization.  She is taking Lasix on a daily basis on an as needed.  Dosing..  There are days when she takes 40 mg, 60 mg, and sometimes it 80 mg if she begins retaining fluid.  I am concerned that she may have mild dehydration with her the crease in blood pressure during standing.  Would like her to decrease her Lasix to 40 mg daily without higher doses until labs are available.  She verbalizes understanding.   Current medicines are reviewed at length with the patient today.  The patient does not haveconcerns regarding medicines.  The following changes have been made:  No changes at this time. Labs/ tests ordered today include: cardiac catheterization, pre-cardiac catheterization orders, amiodarone level.No orders of the defined types were placed in this encounter.     Disposition:   FU with post cardiac catheterization. Signed, Jory Sims, NP  09/22/2014 2:11 PM    [facility] [address], [place], [place]  [address] Phone: [phone]; Fax: [phone]

## 2014-09-26 NOTE — Interval H&P Note (Signed)
History and Physical Interval Note:  09/26/2014 11:12 AM  [name]  has presented today for surgery, with the diagnosis of CP = concerning for Class III angina with Abnormal ST. The various methods of treatment have been discussed with the patient and family. After consideration of risks, benefits and other options for treatment, the patient has consented to  Procedure(s): LEFT HEART CATHETERIZATION WITH CORONARY ANGIOGRAM (N/A) as a surgical intervention .  The patient's history has been reviewed, patient examined, no change in status, stable for surgery.  I have reviewed the patient's chart and labs.  Questions were answered to the patient's satisfaction.    Cath Lab Visit (complete for each Cath Lab visit)  Clinical Evaluation Leading to the Procedure:   ACS: No.  Non-ACS:    Anginal Classification: CCS III  Anti-ischemic medical therapy: Maximal Therapy (2 or more classes of medications)  Non-Invasive Test Results: Low-risk stress test findings: cardiac mortality <1%/year  Prior CABG: No previous CABG   [name]  Ischemic Symptoms? CCS III (Marked limitation of ordinary activity) Anti-ischemic Medical Therapy? Maximal Medical Therapy (2 or more classes of medications) Non-invasive Test Results? Low-risk stress test findings: cardiac mortality <1%/year Prior CABG? No Previous CABG   Patient Information:   1-2V CAD, no prox LAD  A (7)  Indication: 15; Score: 7   Patient Information:   CTO of 1 vessel, no other CAD  U (6)  Indication: 25; Score: 6   Patient Information:   1V CAD with prox LAD  A (8)  Indication: 31; Score: 8   Patient Information:   2V-CAD with prox LAD  A (8)  Indication: 37; Score: 8   Patient Information:   3V-CAD without LMCA  A (8)  Indication: 43; Score: 8   Patient Information:   3V-CAD without LMCA With Abnormal LV systolic function  A (9)  Indication: 48; Score: 9   Patient Information:   LMCA-CAD  A  (9)  Indication: 49; Score: 9   Patient Information:   2V-CAD with prox LAD PCI  A (7)  Indication: 62; Score: 7   Patient Information:   2V-CAD with prox LAD CABG  A (8)  Indication: 62; Score: 8   Patient Information:   3V-CAD without LMCA With Low CAD burden(i.e., 3 focal stenoses, low SYNTAX score) PCI  A (7)  Indication: 63; Score: 7   Patient Information:   3V-CAD without LMCA With Low CAD burden(i.e., 3 focal stenoses, low SYNTAX score) CABG  A (9)  Indication: 63; Score: 9   Patient Information:   3V-CAD without LMCA E06c - Intermediate-high CAD burden (i.e., multiple diffuse lesions, presence of CTO, or high SYNTAX score) PCI  U (4)  Indication: 64; Score: 4   Patient Information:   3V-CAD without LMCA E06c - Intermediate-high CAD burden (i.e., multiple diffuse lesions, presence of CTO, or high SYNTAX score) CABG  A (9)  Indication: 64; Score: 9   Patient Information:   LMCA-CAD With Isolated LMCA stenosis  PCI  U (6)  Indication: 65; Score: 6   Patient Information:   LMCA-CAD With Isolated LMCA stenosis  CABG  A (9)  Indication: 65; Score: 9   Patient Information:   LMCA-CAD Additional CAD, low CAD burden (i.e., 1- to 2-vessel additional involvement, low SYNTAX score) PCI  U (5)  Indication: 66; Score: 5   Patient Information:   LMCA-CAD Additional CAD, low CAD burden (i.e., 1- to 2-vessel additional involvement, low SYNTAX score) CABG  A (9)  Indication: 66; Score: 9   Patient Information:   LMCA-CAD Additional CAD, intermediate-high CAD burden (i.e., 3-vessel involvement, presence of CTO, or high SYNTAX score) PCI  I (3)  Indication: 67; Score: 3   Patient Information:   LMCA-CAD Additional CAD, intermediate-high CAD burden (i.e., 3-vessel involvement, presence of CTO, or high SYNTAX score) CABG  A (9)  Indication: 67; Score: 9

## 2014-09-26 NOTE — CV Procedure (Signed)
CARDIAC CATHETERIZATION REPORT  NAME:  [name]   MRN: [id number] DOB:  June 16, 1936   ADMIT DATE: 09/26/2014 Procedure Date: 09/26/2014  INTERVENTIONAL CARDIOLOGIST: Leonie Man, M.D., MS PRIMARY CARE PROVIDER: Delphina Cahill, MD PRIMARY CARDIOLOGIST: Satira Sark, MD  PATIENT:  [name] is a 79 y.o. female with prior history of CAD status post PCI to the RCA with 2 stents as well as tachybradycardia syndrome status post pacemaker placement on lead regulation for atrial fibrillation. She was admitted to [facility] on January 22 for chest pain. She's continued to have her mitten chest pain associated with hypertension. She had a Myoview done that showed inferolateral infarct with small peri-infarct ischemia. There was a low threshold for proceeding to invasive evaluation. Since her symptoms continued with exertional dyspnea and chest discomfort, she is now referred for invasive evaluation.  PRE-OPERATIVE DIAGNOSIS:    Class 2-3 Angina and Exertional Dyspnea  Abnormal Nuclear Stress Test, Low Risk  PROCEDURES PERFORMED:    Left Heart Catheterization with Native Coronary Angiography  via Right Radial Artery   Left Ventriculography  PROCEDURE: The patient was brought to the [facility] in the fasting state and prepped and draped in the usual sterile fashion for Right Radial artery access. A modified Allen's test was performed on the right wrist demonstrating excellent collateral flow for radial access.   Sterile technique was used including antiseptics, cap, gloves, gown, hand hygiene, mask and sheet. Skin prep: Chlorhexidine.   Consent: Risks of procedure as well as the alternatives and risks of each were explained to the (patient/caregiver). Consent for procedure obtained.   Time Out: Verified patient identification, verified procedure, site/side was marked, verified correct patient position, special equipment/implants available,  medications/allergies/relevent history reviewed, required imaging and test results available. Performed.  Access:   Right Radial Artery: 6 Fr Sheath -  Seldinger Technique (Angiocath Micropuncture Kit)  Radial Cocktail - 10 mL; IV Heparin 3500 Units   Left Heart Catheterization: 5 Fr TIG 4 Catheter advanced and removed over a Versicore.  The catheter was used to cross the aortic valve for measurement (clinical hemodynamics as well as to engage both left and right coronary arteries for selective coronary angiography. Catheter may dilation was performed over wire when necessary and under fluoroscopic guidance.  Left & Right Coronary Artery Cineangiography: TIG 4.0 Catheter   LV Hemodynamics (LV Gram): TIG 4.0 Catheter  Sheath removed in the Cardiac Catheterization Lab with TR Band Placement for hemostasis.  TR Band: 1145  Hours; 15 mL air  FINDINGS:  Hemodynamics:   Central Aortic Pressure / Mean: 112/50/74 mmHg  Left Ventricular Pressure / LVEDP: 111/3/12 mmHg  Left Ventriculography:  EF: 55-60 %  Wall Motion: Overall normal wall motion with a somewhat globular appearing apex  Coronary Anatomy:  Dominance: Right  Left Main: Normal caliber vessel with moderate calcification. It bifurcates into the LAD And Circumflex. No significant stenoses. LAD: Normal caliber vessel with proximal roughly 30% calcified stenosis. There is also a focal 40% stenosis at the takeoff of D2. Distally there is 50% stenosis. There are 4 diagonal branches with the first to be moderate caliber second tubing of the small caliber. Minimal disease noted. The distal LAD does not reach all the way to the apex.  Left Circumflex: Normal caliber, nondominant vessel. It gives off a OM1 from the mid vessel. After the OM1 there is a tubular moderate 30-40% stenosis before the vessel bifurcates into a small AV  groove branch and OM 2.  OM1: Small-moderate caliber vessel with diffuse 50% stenoses. No significant change  from prior catheterization.  OM 2: Small- moderate caliber vessel that bifurcates distally into 2 small caliber branches. Angiographically normal.   RCA: Large-caliber, dominant vessel with 2 patent stents in the proximal segment. There is diffuse tubular 40% stenosis just prior to the crux. The vessel then normalizes distally as it bifurcates into the Right Posterior AV Groove Branch (RPAV) and the Right Posterior Descending Artery (RPDA).  RPDA: Moderate-large-caliber vessel that is extensive and reaches to the apex and around and to the anteroapex. The vessel is somewhat tortuous but no significant disease.  RPL Sysytem:The RPAV begins as a moderate large-caliber vessel that terminates as one major moderate caliber posterolateral branch with several smaller branches.  MEDICATIONS:  Anesthesia:  Local Lidocaine 2 ml  Sedation:  2 mg IV Versed, 50 mcg IV fentanyl ;   Omnipaque Contrast: 70 ml  Anticoagulation:  IV Heparin 3500 Units  Radial Cocktail: 5 mg Verapamil, 400 mcg NTG, 2 ml 2% Lidocaine in 10 ml NS 200 g IC Nitroglycerin  PATIENT DISPOSITION:    The patient was transferred to the PACU holding area in a hemodynamicaly stable, chest pain free condition.  The patient tolerated the procedure well, and there were no complications.  EBL:   < 5 ml  The patient was stable before, during, and after the procedure.  POST-OPERATIVE DIAGNOSIS:    Angiographically moderate residual disease with widely patent stents in the RCA. The OM1 stenosis appears to be somewhat improved as compared to prior catheterization.  No culprit lesion found to explain the patient's symptoms or potential ischemia in the peri-infarct distribution.  Normal well-preserved EF with normal LVEDP.  PLAN OF CARE:  Standard post radial cath care  Continue medical management for potential microvascular disease versus hypertension related endocardial ischemia with diastolic dysfunction.  Follow-up with  either Dr. Domenic Polite or Vilinda Flake, NP    Leonie Man, M.D., M.S. Interventional Cardiologist   Pager # [phone]

## 2014-10-04 DIAGNOSIS — I2510 Atherosclerotic heart disease of native coronary artery without angina pectoris: Secondary | ICD-10-CM | POA: Diagnosis not present

## 2014-10-04 DIAGNOSIS — I495 Sick sinus syndrome: Secondary | ICD-10-CM | POA: Diagnosis not present

## 2014-10-04 DIAGNOSIS — K921 Melena: Secondary | ICD-10-CM | POA: Diagnosis not present

## 2014-10-04 DIAGNOSIS — D649 Anemia, unspecified: Secondary | ICD-10-CM | POA: Diagnosis not present

## 2014-10-04 DIAGNOSIS — Z23 Encounter for immunization: Secondary | ICD-10-CM | POA: Diagnosis not present

## 2014-10-04 DIAGNOSIS — I10 Essential (primary) hypertension: Secondary | ICD-10-CM | POA: Diagnosis not present

## 2014-10-06 ENCOUNTER — Encounter (INDEPENDENT_AMBULATORY_CARE_PROVIDER_SITE_OTHER): Payer: Self-pay | Admitting: *Deleted

## 2014-10-08 DIAGNOSIS — I509 Heart failure, unspecified: Secondary | ICD-10-CM | POA: Diagnosis not present

## 2014-10-10 ENCOUNTER — Encounter: Payer: Self-pay | Admitting: Internal Medicine

## 2014-10-11 ENCOUNTER — Other Ambulatory Visit (INDEPENDENT_AMBULATORY_CARE_PROVIDER_SITE_OTHER): Payer: Self-pay | Admitting: *Deleted

## 2014-10-11 ENCOUNTER — Telehealth (INDEPENDENT_AMBULATORY_CARE_PROVIDER_SITE_OTHER): Payer: Self-pay | Admitting: *Deleted

## 2014-10-11 ENCOUNTER — Encounter (INDEPENDENT_AMBULATORY_CARE_PROVIDER_SITE_OTHER): Payer: Self-pay | Admitting: Internal Medicine

## 2014-10-11 ENCOUNTER — Ambulatory Visit (INDEPENDENT_AMBULATORY_CARE_PROVIDER_SITE_OTHER): Payer: Medicare Other | Admitting: Internal Medicine

## 2014-10-11 VITALS — BP 120/62 | HR 76 | Temp 98.3°F | Ht 66.5 in | Wt 158.9 lb

## 2014-10-11 DIAGNOSIS — Z1211 Encounter for screening for malignant neoplasm of colon: Secondary | ICD-10-CM

## 2014-10-11 DIAGNOSIS — K625 Hemorrhage of anus and rectum: Secondary | ICD-10-CM

## 2014-10-11 DIAGNOSIS — Z800 Family history of malignant neoplasm of digestive organs: Secondary | ICD-10-CM

## 2014-10-11 LAB — CBC
HCT: 31.6 % — ABNORMAL LOW (ref 36.0–46.0)
Hemoglobin: 9.9 g/dL — ABNORMAL LOW (ref 12.0–15.0)
MCH: 28.5 pg (ref 26.0–34.0)
MCHC: 31.3 g/dL (ref 30.0–36.0)
MCV: 91.1 fL (ref 78.0–100.0)
MPV: 9.8 fL (ref 8.6–12.4)
Platelets: 293 10*3/uL (ref 150–400)
RBC: 3.47 MIL/uL — AB (ref 3.87–5.11)
RDW: 14.2 % (ref 11.5–15.5)
WBC: 4.8 10*3/uL (ref 4.0–10.5)

## 2014-10-11 NOTE — Patient Instructions (Addendum)
Colonoscopy. The risks and benefits such as perforation, bleeding, and infection were reviewed with the patient and is agreeable. CBC today.

## 2014-10-11 NOTE — Telephone Encounter (Signed)
Patient needs movi prep 

## 2014-10-11 NOTE — Progress Notes (Signed)
Subjective:    Patient ID: [name], female    DOB: 1936-06-05, 79 y.o.   MRN: [id number]  HPI Presents today with c/o rectal bleeding x 2 months. Bleeding is not with every BM. Notices the blood in the BM. She says she saw blood this am in the toilet. She also c/o urgency. Family hx of colon cancer in a mother in her 1s.  Sometimes she will have lower abdominal pain before she has a BM. Appetite for the most part is good. She says she has lost 3 pounds which she thinks is fluid. Occasionally has dysphagia. Has a problems swallowing her potassium pill.   has had a 3 gm drop in her hemoglobin since January.  Her last colonoscopy was in 05/2010 which revealed external hemorrhoids, otherwise normal colonoscopy.  Family hx of colon cancer in a mother in her 28s.    Hx of CAD,atrial fib. Maintained on Plavix and ASA. Also has pacemaker.  CBC Latest Ref Rng 09/22/2014 08/24/2014 08/23/2014  WBC 4.0 - 10.5 K/uL 5.2 4.9 5.7  Hemoglobin 12.0 - 15.0 g/dL 11.1(L) 11.4(L) 12.3  Hematocrit 36.0 - 46.0 % 34.1(L) 35.1(L) 38.4  Platelets 150 - 400 K/uL 217 184 199   08/11/2014 H and H 14.0 and 42.3.    CMP     Component Value Date/Time   NA 140 09/22/2014 1532   K 4.1 09/22/2014 1532   CL 106 09/22/2014 1532   CO2 26 09/22/2014 1532   GLUCOSE 87 09/22/2014 1532   BUN 15 09/22/2014 1532   CREATININE 0.94 09/22/2014 1532   CREATININE 0.71 08/24/2014 0455   CALCIUM 9.3 09/22/2014 1532   PROT 6.9 08/24/2014 0455   ALBUMIN 3.7 08/24/2014 0455   AST 22 08/24/2014 0455   ALT 19 08/24/2014 0455   ALKPHOS 73 08/24/2014 0455   BILITOT 0.6 08/24/2014 0455   GFRNONAA 82* 08/24/2014 0455   GFRAA >90 08/24/2014 0455       Review of Systems Past Medical History  Diagnosis Date  . Atrial fibrillation   . Coronary atherosclerosis of native coronary artery     DES x 2 to RCA 10/10  . GERD (gastroesophageal reflux disease)   . Tachycardia-bradycardia syndrome     s/p Medtronic Adapta L  dual chamber device  5/10  . Hyperlipidemia   . COPD (chronic obstructive pulmonary disease)   . Anxiety   . Resting tremor   . 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 Chronic back pain   . Pericardial effusion     Hemorrhagic   . Essential hypertension, benign   . Headache(784.0)   . H/O hiatal hernia   . Neuromuscular disorder     Tremors    Past Surgical History  Procedure Laterality Date  . Cholecystectomy    . Appendectomy    . Subxiphoid pericardial window  11/10  . Esophagogastroduodenoscopy with esophageal dilation  2004, 2006, 2007  . Vaginal hysterectomy    . Colonoscopy  2011  . Right rotator cuff repair    . Insert / replace / remove pacemaker    . Left heart catheterization with coronary angiogram N/A 11/17/2011    Procedure: LEFT HEART CATHETERIZATION WITH CORONARY ANGIOGRAM;  Surgeon: Sherren Mocha, MD;  Location: [facility] CATH LAB;  Service: Cardiovascular;  Laterality: N/A;  . Left heart catheterization with coronary angiogram N/A 09/26/2014    Procedure: LEFT HEART CATHETERIZATION WITH CORONARY ANGIOGRAM;  Surgeon: Leonie Man, MD;  Location: [facility]  CATH LAB;  Service: Cardiovascular;  Laterality: N/A;    Allergies  Allergen Reactions  . Amitriptyline Hcl Other (See Comments)    Caused "jaws to twist and lock"  . Sulfonamide Derivatives Other (See Comments)    UNKNOWN REACTION    Current Outpatient Prescriptions on File Prior to Visit  Medication Sig Dispense Refill  . ALPRAZolam (XANAX) 1 MG tablet Take 1 mg by mouth at bedtime. 1/2 tab at night    . amiodarone (PACERONE) 100 MG tablet Take 1 tablet (100 mg total) by mouth daily. (Patient taking differently: Take 200 mg by mouth daily. ) 90 tablet 3  . aspirin EC 81 MG tablet Take 81 mg by mouth daily.    . clopidogrel (PLAVIX) 75 MG tablet Take 75 mg by mouth every morning.    . Cyanocobalamin (B-12 PO) Take 1 tablet by mouth daily as needed (for supplement).    Marland Kitchen esomeprazole (NEXIUM) 40  MG capsule Take 40 mg by mouth daily.     . furosemide (LASIX) 40 MG tablet Take 40-60 mg by mouth daily as needed (for swelling).     . metoprolol (TOPROL-XL) 50 MG 24 hr tablet Take 75 mg by mouth every morning.     . Multiple Vitamin (MULTIVITAMIN) tablet Take 1 tablet by mouth daily.    . nitroGLYCERIN (NITROSTAT) 0.4 MG SL tablet Place 0.4 mg under the tongue every 5 (five) minutes as needed for chest pain.     . potassium chloride SA (K-DUR,KLOR-CON) 20 MEQ tablet Take 20-40 mEq by mouth daily as needed (Takes 62mEq daily, but increases to 41mEq as needed for cramping.).     Marland Kitchen primidone (MYSOLINE) 50 MG tablet Take 50 mg by mouth 2 (two) times daily as needed (for tremors).     . thiamine (VITAMIN B-1) 100 MG tablet Take 100 mg by mouth daily.    . valsartan (DIOVAN) 40 MG tablet Take 40 mg by mouth as needed.     Marland Kitchen VITAMIN D, CHOLECALCIFEROL, PO Take 1 capsule by mouth daily.     . vitamin E 400 UNIT capsule Take 400 Units by mouth daily.     No current facility-administered medications on file prior to visit.        Objective:   Physical Exam Blood pressure 120/62, pulse 76, temperature 98.3 F (36.8 C), height 5' 6.5" (1.689 m), weight 158 lb 14.4 oz (72.077 kg).  Alert and oriented. Skin warm and dry. Oral mucosa is moist.   . Sclera anicteric, conjunctivae is pink. Thyroid not enlarged. No cervical lymphadenopathy. Lungs clear. Heart regular rate and rhythm.  Abdomen is soft. Bowel sounds are positive. No hepatomegaly. No abdominal masses felt. No tenderness.  No edema to lower extremities.  Stool grossly positive.     Assessment & Plan:  Rectal bleeding. Possible hemorrhoidal. Family hx of colon cancer in a mother. Stool grossly positive Colonic neoplasm also needs to be ruled out.  CBC today.

## 2014-10-13 ENCOUNTER — Encounter: Payer: 59 | Admitting: Internal Medicine

## 2014-10-16 MED ORDER — PEG 3350-KCL-NA BICARB-NACL 420 G PO SOLR: 4000.0000 mL | Freq: Once | ORAL | Status: DC

## 2014-10-19 ENCOUNTER — Encounter (HOSPITAL_COMMUNITY)
Admission: RE | Disposition: A | Discharge status: Home / Self Care | Payer: Self-pay | Source: Ambulatory Visit | Attending: Internal Medicine

## 2014-10-19 ENCOUNTER — Ambulatory Visit (HOSPITAL_COMMUNITY)
Admission: RE | Admit: 2014-10-19 | Discharge: 2014-10-19 | Disposition: A | Discharge status: Home / Self Care | Payer: Medicare Other | Source: Ambulatory Visit | Attending: Internal Medicine | Admitting: Internal Medicine

## 2014-10-19 ENCOUNTER — Encounter (HOSPITAL_COMMUNITY): Payer: Self-pay | Admitting: *Deleted

## 2014-10-19 DIAGNOSIS — D500 Iron deficiency anemia secondary to blood loss (chronic): Secondary | ICD-10-CM

## 2014-10-19 DIAGNOSIS — Z800 Family history of malignant neoplasm of digestive organs: Secondary | ICD-10-CM

## 2014-10-19 DIAGNOSIS — K648 Other hemorrhoids: Secondary | ICD-10-CM | POA: Insufficient documentation

## 2014-10-19 DIAGNOSIS — F419 Anxiety disorder, unspecified: Secondary | ICD-10-CM | POA: Diagnosis not present

## 2014-10-19 DIAGNOSIS — Z7982 Long term (current) use of aspirin: Secondary | ICD-10-CM | POA: Insufficient documentation

## 2014-10-19 DIAGNOSIS — J449 Chronic obstructive pulmonary disease, unspecified: Secondary | ICD-10-CM | POA: Diagnosis not present

## 2014-10-19 DIAGNOSIS — K625 Hemorrhage of anus and rectum: Secondary | ICD-10-CM

## 2014-10-19 DIAGNOSIS — K644 Residual hemorrhoidal skin tags: Secondary | ICD-10-CM | POA: Diagnosis not present

## 2014-10-19 DIAGNOSIS — Z882 Allergy status to sulfonamides status: Secondary | ICD-10-CM | POA: Diagnosis not present

## 2014-10-19 DIAGNOSIS — D649 Anemia, unspecified: Secondary | ICD-10-CM | POA: Diagnosis not present

## 2014-10-19 DIAGNOSIS — I10 Essential (primary) hypertension: Secondary | ICD-10-CM | POA: Insufficient documentation

## 2014-10-19 DIAGNOSIS — K219 Gastro-esophageal reflux disease without esophagitis: Secondary | ICD-10-CM | POA: Diagnosis not present

## 2014-10-19 DIAGNOSIS — Z79899 Other long term (current) drug therapy: Secondary | ICD-10-CM | POA: Diagnosis not present

## 2014-10-19 DIAGNOSIS — I2510 Atherosclerotic heart disease of native coronary artery without angina pectoris: Secondary | ICD-10-CM | POA: Diagnosis not present

## 2014-10-19 DIAGNOSIS — I4891 Unspecified atrial fibrillation: Secondary | ICD-10-CM | POA: Insufficient documentation

## 2014-10-19 DIAGNOSIS — D123 Benign neoplasm of transverse colon: Secondary | ICD-10-CM | POA: Diagnosis not present

## 2014-10-19 DIAGNOSIS — Z90710 Acquired absence of both cervix and uterus: Secondary | ICD-10-CM | POA: Diagnosis not present

## 2014-10-19 DIAGNOSIS — E785 Hyperlipidemia, unspecified: Secondary | ICD-10-CM | POA: Diagnosis not present

## 2014-10-19 DIAGNOSIS — Z9049 Acquired absence of other specified parts of digestive tract: Secondary | ICD-10-CM | POA: Insufficient documentation

## 2014-10-19 HISTORY — PX: COLONOSCOPY: SHX5424

## 2014-10-19 LAB — IRON AND TIBC
Iron: 13 ug/dL — ABNORMAL LOW (ref 42–145)
Saturation Ratios: 4 % — ABNORMAL LOW (ref 20–55)
TIBC: 364 ug/dL (ref 250–470)
UIBC: 351 ug/dL (ref 125–400)

## 2014-10-19 LAB — FERRITIN: Ferritin: 25 ng/mL (ref 10–291)

## 2014-10-19 LAB — HEMOGLOBIN AND HEMATOCRIT, BLOOD
HCT: 28 % — ABNORMAL LOW (ref 36.0–46.0)
Hemoglobin: 8.6 g/dL — ABNORMAL LOW (ref 12.0–15.0)

## 2014-10-19 SURGERY — COLONOSCOPY
Anesthesia: Moderate Sedation

## 2014-10-19 MED ORDER — MIDAZOLAM HCL 5 MG/5ML IJ SOLN
INTRAMUSCULAR | Status: AC
  Filled 2014-10-19: qty 10

## 2014-10-19 MED ORDER — STERILE WATER FOR IRRIGATION IR SOLN
Status: DC | PRN
  Administered 2014-10-19: 11:00:00

## 2014-10-19 MED ORDER — MEPERIDINE HCL 50 MG/ML IJ SOLN
INTRAMUSCULAR | Status: DC | PRN
  Administered 2014-10-19 (×2): 25 mg via INTRAVENOUS

## 2014-10-19 MED ORDER — MEPERIDINE HCL 50 MG/ML IJ SOLN
INTRAMUSCULAR | Status: AC
  Filled 2014-10-19: qty 1

## 2014-10-19 MED ORDER — MIDAZOLAM HCL 5 MG/5ML IJ SOLN
INTRAMUSCULAR | Status: DC | PRN
  Administered 2014-10-19: 2 mg via INTRAVENOUS
  Administered 2014-10-19 (×2): 1 mg via INTRAVENOUS
  Administered 2014-10-19: 2 mg via INTRAVENOUS

## 2014-10-19 MED ORDER — SODIUM CHLORIDE 0.9 % IV SOLN
INTRAVENOUS | Status: DC
  Administered 2014-10-19: 10:00:00 via INTRAVENOUS

## 2014-10-19 NOTE — Op Note (Signed)
COLONOSCOPY PROCEDURE REPORT  PATIENT:  [name]  MR#:  [id number] Birthdate:  1936/03/16, 79 y.o., female Endoscopist:  Dr. Rogene Houston, MD Referred By:  Dr. Wende Neighbors, MD  Procedure Date: 10/19/2014  Procedure:   Colonoscopy  Indications:  Patient is 79 year old Caucasian female whose last colonoscopy was in October 2011 who now presents with intermittent rectal bleeding and also noted to have gradual drop in her H&H. hemoglobin was 12.3 in January and now in 9.9. She denies melena. She has been on low-dose aspirin and clopidogrel.  Informed Consent:  The procedure and risks were reviewed with the patient and informed consent was obtained.  Medications:  Demerol 50 mg IV Versed 6 mg IV  Description of procedure:  After a digital rectal exam was performed, that colonoscope was advanced from the anus through the rectum and colon to the area of the cecum, ileocecal valve and appendiceal orifice. The cecum was deeply intubated. These structures were well-seen and photographed for the record. From the level of the cecum and ileocecal valve, the scope was slowly and cautiously withdrawn. The mucosal surfaces were carefully surveyed utilizing scope tip to flexion to facilitate fold flattening as needed. The scope was pulled down into the rectum where a thorough exam including retroflexion was performed.  Findings:  Prep excellent. 4 mm polyp ablated via cold biopsy from proximal transverse colon. Mucosa of rest of the colon and rectum was normal. Hemorrhoids noted above and below the dentate line.    Therapeutic/Diagnostic Maneuvers Performed:  See above  Complications:  None  Cecal Withdrawal Time:  10 minutes  Impression:  Examination performed to cecum. 4 mm polyp ablated via cold biopsy from transverse colon. Internal and external hemorrhoids felt to be source of patient's rectal bleeding.  Recommendations:  Standard instructions given. Will check H&H serum iron TIBC  and ferritin levels today. She may or may not need further evaluation depending on her clinical course. I will contact patient with biopsy results and further recommendations.  Kehinde Bowdish U  10/19/2014 11:17 AM  CC: Dr. Delphina Cahill, MD & Dr. Rayne Du ref. provider found

## 2014-10-19 NOTE — Discharge Instructions (Signed)
Resume usual medications and diet. No driving for 24 hours. Physician will call with results of biopsy and blood tests. Notify if you have black or tarry stools.   Colonoscopy, Care After These instructions give you information on caring for yourself after your procedure. Your doctor may also give you more specific instructions. Call your doctor if you have any problems or questions after your procedure. HOME CARE  Do not drive for 24 hours.  Do not sign important papers or use machinery for 24 hours.  You may shower.  You may go back to your usual activities, but go slower for the first 24 hours.  Take rest breaks often during the first 24 hours.  Walk around or use warm packs on your belly (abdomen) if you have belly cramping or gas.  Drink enough fluids to keep your pee (urine) clear or pale yellow.  Resume your normal diet. Avoid heavy or fried foods.  Avoid drinking alcohol for 24 hours or as told by your doctor.  Only take medicines as told by your doctor. If a tissue sample (biopsy) was taken during the procedure:   Do not take aspirin or blood thinners for 7 days, or as told by your doctor.  Do not drink alcohol for 7 days, or as told by your doctor.  Eat soft foods for the first 24 hours. GET HELP IF: You still have a small amount of blood in your poop (stool) 2-3 days after the procedure. GET HELP RIGHT AWAY IF:  You have more than a small amount of blood in your poop.  You see clumps of tissue (blood clots) in your poop.  Your belly is puffy (swollen).  You feel sick to your stomach (nauseous) or throw up (vomit).  You have a fever.  You have belly pain that gets worse and medicine does not help. MAKE SURE YOU:  Understand these instructions.  Will watch your condition.  Will get help right away if you are not doing well or get worse. Document Released: 08/23/2010 Document Revised: 07/26/2013 Document Reviewed: 03/28/2013 [facility] Patient  Information 2015 [place], [place]. This information is not intended to replace advice given to you by your health care provider. Make sure you discuss any questions you have with your health care provider.  Colon Polyps Polyps are lumps of extra tissue growing inside the body. Polyps can grow in the large intestine (colon). Most colon polyps are noncancerous (benign). However, some colon polyps can become cancerous over time. Polyps that are larger than a pea may be harmful. To be safe, caregivers remove and test all polyps. CAUSES  Polyps form when mutations in the genes cause your cells to grow and divide even though no more tissue is needed. RISK FACTORS There are a number of risk factors that can increase your chances of getting colon polyps. They include:  Being older than 50 years.  Family history of colon polyps or colon cancer.  Long-term colon diseases, such as colitis or Crohn disease.  Being overweight.  Smoking.  Being inactive.  Drinking too much alcohol. SYMPTOMS  Most small polyps do not cause symptoms. If symptoms are present, they may include:  Blood in the stool. The stool may look dark red or black.  Constipation or diarrhea that lasts longer than 1 week. DIAGNOSIS People often do not know they have polyps until their caregiver finds them during a regular checkup. Your caregiver can use 4 tests to check for polyps:  Digital rectal exam. The caregiver wears  gloves and feels inside the rectum. This test would find polyps only in the rectum.  Barium enema. The caregiver puts a liquid called barium into your rectum before taking X-rays of your colon. Barium makes your colon look white. Polyps are dark, so they are easy to see in the X-ray pictures.  Sigmoidoscopy. A thin, flexible tube (sigmoidoscope) is placed into your rectum. The sigmoidoscope has a light and tiny camera in it. The caregiver uses the sigmoidoscope to look at the last third of your  colon.  Colonoscopy. This test is like sigmoidoscopy, but the caregiver looks at the entire colon. This is the most common method for finding and removing polyps. TREATMENT  Any polyps will be removed during a sigmoidoscopy or colonoscopy. The polyps are then tested for cancer. PREVENTION  To help lower your risk of getting more colon polyps:  Eat plenty of fruits and vegetables. Avoid eating fatty foods.  Do not smoke.  Avoid drinking alcohol.  Exercise every day.  Lose weight if recommended by your caregiver.  Eat plenty of calcium and folate. Foods that are rich in calcium include milk, cheese, and broccoli. Foods that are rich in folate include chickpeas, kidney beans, and spinach. HOME CARE INSTRUCTIONS Keep all follow-up appointments as directed by your caregiver. You may need periodic exams to check for polyps. SEEK MEDICAL CARE IF: You notice bleeding during a bowel movement. Document Released: 04/16/2004 Document Revised: 10/13/2011 Document Reviewed: 09/30/2011 [facility] Patient Information 2015 [place], [place]. This information is not intended to replace advice given to you by your health care provider. Make sure you discuss any questions you have with your health care provider.

## 2014-10-19 NOTE — H&P (Signed)
[name] is an 80 y.o. female.   Chief Complaint: Patient's here for colonoscopy. HPI: Patient is 79 year old Caucasian female who is here for diagnostic colonoscopy. She presents with history of rectal bleeding over the last few weeks. Bleeding generally occurs with bowel movements this fresh and small in amount. She denies diarrhea or constipation. She has intermittent right low quadrant pain which she's had for years. Her appetite is fair and her weight has been stable. She was noted to have her hemoglobin dropped from 12.3 on 08/23/2014 to 11.1 and 09/22/2014. Patient was seen in the office on 10/11/2014 and noted to have strongly heme positive stool. Patient's last colonoscopy was in October 2011 revealing external hemorrhoids. H&H on 10/11/2014 was 9.9 and 31.6. Patient is on low-dose aspirin and clopidogrel. Clopidogrel is on hold. Family history is significant for CRC in mother who was in early 26s at the time of diagnosis and left to be 23.  Past Medical History  Diagnosis Date  . Atrial fibrillation   . Coronary atherosclerosis of native coronary artery     DES x 2 to RCA 10/10  . GERD (gastroesophageal reflux disease)   . Tachycardia-bradycardia syndrome     s/p Medtronic Adapta L dual chamber device  5/10  . Hyperlipidemia   . COPD (chronic obstructive pulmonary disease)   . Anxiety   . Resting tremor   . 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 Chronic back pain   . Pericardial effusion     Hemorrhagic   . Essential hypertension, benign   . Headache(784.0)   . H/O hiatal hernia   . Neuromuscular disorder     Tremors    Past Surgical History  Procedure Laterality Date  . Cholecystectomy    . Appendectomy    . Subxiphoid pericardial window  11/10  . Esophagogastroduodenoscopy with esophageal dilation  2004, 2006, 2007  . Vaginal hysterectomy    . Colonoscopy  2011  . Right rotator cuff repair    . Insert / replace / remove pacemaker    .  Left heart catheterization with coronary angiogram N/A 11/17/2011    Procedure: LEFT HEART CATHETERIZATION WITH CORONARY ANGIOGRAM;  Surgeon: Sherren Mocha, MD;  Location: [facility] CATH LAB;  Service: Cardiovascular;  Laterality: N/A;  . Left heart catheterization with coronary angiogram N/A 09/26/2014    Procedure: LEFT HEART CATHETERIZATION WITH CORONARY ANGIOGRAM;  Surgeon: Leonie Man, MD;  Location: [facility] CATH LAB;  Service: Cardiovascular;  Laterality: N/A;    Family History  Problem Relation Age of Onset  . Cancer Mother     Colon   . Coronary artery disease Sister   . Coronary artery disease Brother   . Arthritis    . Lung disease    . Asthma     Social History:  reports that she has never smoked. She has never used smokeless tobacco. She reports that she does not drink alcohol or use illicit drugs.  Allergies:  Allergies  Allergen Reactions  . Amitriptyline Hcl Other (See Comments)    Caused "jaws to twist and lock"  . Sulfonamide Derivatives Other (See Comments)    UNKNOWN REACTION    Medications Prior to Admission  Medication Sig Dispense Refill  . ALPRAZolam (XANAX) 1 MG tablet Take 1 mg by mouth at bedtime. 1/2 tab at night    . amiodarone (PACERONE) 100 MG tablet Take 1 tablet (100 mg total) by mouth daily. (Patient taking differently: Take 200 mg by  mouth daily. ) 90 tablet 3  . aspirin EC 81 MG tablet Take 81 mg by mouth daily.    . Cyanocobalamin (B-12 PO) Take 1 tablet by mouth daily as needed (for supplement).    Marland Kitchen esomeprazole (NEXIUM) 40 MG capsule Take 40 mg by mouth daily.     . furosemide (LASIX) 40 MG tablet Take 40-60 mg by mouth daily as needed (for swelling).     . metoprolol (TOPROL-XL) 50 MG 24 hr tablet Take 75 mg by mouth every morning.     . Multiple Vitamin (MULTIVITAMIN) tablet Take 1 tablet by mouth daily.    . nitroGLYCERIN (NITROSTAT) 0.4 MG SL tablet Place 0.4 mg under the tongue every 5 (five) minutes as needed for chest pain.     . polyethylene  glycol-electrolytes (NULYTELY/GOLYTELY) 420 G solution Take 4,000 mLs by mouth once. 4000 mL 0  . potassium chloride SA (K-DUR,KLOR-CON) 20 MEQ tablet Take 20-40 mEq by mouth daily as needed (Takes 3mEq daily, but increases to 71mEq as needed for cramping.).     Marland Kitchen primidone (MYSOLINE) 50 MG tablet Take 50 mg by mouth 2 (two) times daily as needed (for tremors).     . thiamine (VITAMIN B-1) 100 MG tablet Take 100 mg by mouth daily.    . valsartan (DIOVAN) 40 MG tablet Take 40 mg by mouth as needed.     Marland Kitchen VITAMIN D, CHOLECALCIFEROL, PO Take 1 capsule by mouth daily.     . vitamin E 400 UNIT capsule Take 400 Units by mouth daily.    . clopidogrel (PLAVIX) 75 MG tablet Take 75 mg by mouth every morning.      No results found for this or any previous visit (from the past 48 hour(s)). No results found.  ROS  Blood pressure 157/87, pulse 65, temperature 97.6 F (36.4 C), resp. rate 20, height 5' 6.5" (1.689 m), weight 158 lb (71.668 kg), SpO2 97 %. Physical Exam  Constitutional: She appears well-developed and well-nourished.  HENT:  Mouth/Throat: Oropharynx is clear and moist.  Eyes: Conjunctivae are normal. No scleral icterus.  Neck: No thyromegaly present.  Cardiovascular: Normal rate, regular rhythm and normal heart sounds.   No murmur heard. Respiratory: Effort normal and breath sounds normal.  GI: Soft. She exhibits no distension and no mass. There is no tenderness.  Lower midline scar.  Musculoskeletal: She exhibits no edema.  Lymphadenopathy:    She has no cervical adenopathy.  Neurological: She is alert.  Skin: Skin is warm and dry.     Assessment/Plan Rectal bleeding and anemia. Family history of CRC in mother at late onset(early 43s).  Kameren Pargas U 10/19/2014, 10:37 AM

## 2014-10-20 ENCOUNTER — Encounter (HOSPITAL_COMMUNITY): Payer: Self-pay | Admitting: Internal Medicine

## 2014-10-24 ENCOUNTER — Encounter (INDEPENDENT_AMBULATORY_CARE_PROVIDER_SITE_OTHER): Payer: Self-pay

## 2014-10-25 ENCOUNTER — Encounter (INDEPENDENT_AMBULATORY_CARE_PROVIDER_SITE_OTHER): Payer: Self-pay | Admitting: *Deleted

## 2014-10-25 ENCOUNTER — Other Ambulatory Visit (INDEPENDENT_AMBULATORY_CARE_PROVIDER_SITE_OTHER): Payer: Self-pay | Admitting: *Deleted

## 2014-10-25 DIAGNOSIS — R195 Other fecal abnormalities: Secondary | ICD-10-CM

## 2014-10-26 ENCOUNTER — Ambulatory Visit (HOSPITAL_COMMUNITY)
Admission: RE | Admit: 2014-10-26 | Discharge: 2014-10-26 | Disposition: A | Discharge status: Home / Self Care | Payer: Medicare Other | Source: Ambulatory Visit | Attending: Internal Medicine | Admitting: Internal Medicine

## 2014-10-26 ENCOUNTER — Encounter (HOSPITAL_COMMUNITY)
Admission: RE | Disposition: A | Discharge status: Home / Self Care | Payer: Self-pay | Source: Ambulatory Visit | Attending: Internal Medicine

## 2014-10-26 ENCOUNTER — Encounter (HOSPITAL_COMMUNITY): Payer: Self-pay | Admitting: *Deleted

## 2014-10-26 ENCOUNTER — Other Ambulatory Visit (INDEPENDENT_AMBULATORY_CARE_PROVIDER_SITE_OTHER): Payer: Self-pay | Admitting: *Deleted

## 2014-10-26 ENCOUNTER — Encounter (INDEPENDENT_AMBULATORY_CARE_PROVIDER_SITE_OTHER): Payer: Self-pay | Admitting: *Deleted

## 2014-10-26 DIAGNOSIS — Z9049 Acquired absence of other specified parts of digestive tract: Secondary | ICD-10-CM | POA: Diagnosis not present

## 2014-10-26 DIAGNOSIS — K449 Diaphragmatic hernia without obstruction or gangrene: Secondary | ICD-10-CM | POA: Diagnosis not present

## 2014-10-26 DIAGNOSIS — Z79899 Other long term (current) drug therapy: Secondary | ICD-10-CM | POA: Insufficient documentation

## 2014-10-26 DIAGNOSIS — J449 Chronic obstructive pulmonary disease, unspecified: Secondary | ICD-10-CM | POA: Diagnosis not present

## 2014-10-26 DIAGNOSIS — I4891 Unspecified atrial fibrillation: Secondary | ICD-10-CM | POA: Diagnosis not present

## 2014-10-26 DIAGNOSIS — K219 Gastro-esophageal reflux disease without esophagitis: Secondary | ICD-10-CM | POA: Insufficient documentation

## 2014-10-26 DIAGNOSIS — D649 Anemia, unspecified: Secondary | ICD-10-CM | POA: Diagnosis not present

## 2014-10-26 DIAGNOSIS — K922 Gastrointestinal hemorrhage, unspecified: Secondary | ICD-10-CM | POA: Diagnosis not present

## 2014-10-26 DIAGNOSIS — I2510 Atherosclerotic heart disease of native coronary artery without angina pectoris: Secondary | ICD-10-CM | POA: Diagnosis not present

## 2014-10-26 DIAGNOSIS — Z86010 Personal history of colonic polyps: Secondary | ICD-10-CM | POA: Diagnosis not present

## 2014-10-26 DIAGNOSIS — Z9889 Other specified postprocedural states: Secondary | ICD-10-CM | POA: Diagnosis not present

## 2014-10-26 DIAGNOSIS — R1013 Epigastric pain: Secondary | ICD-10-CM | POA: Diagnosis present

## 2014-10-26 DIAGNOSIS — Z882 Allergy status to sulfonamides status: Secondary | ICD-10-CM | POA: Diagnosis not present

## 2014-10-26 DIAGNOSIS — Z950 Presence of cardiac pacemaker: Secondary | ICD-10-CM | POA: Insufficient documentation

## 2014-10-26 DIAGNOSIS — K222 Esophageal obstruction: Secondary | ICD-10-CM | POA: Insufficient documentation

## 2014-10-26 DIAGNOSIS — E785 Hyperlipidemia, unspecified: Secondary | ICD-10-CM | POA: Insufficient documentation

## 2014-10-26 DIAGNOSIS — F419 Anxiety disorder, unspecified: Secondary | ICD-10-CM | POA: Diagnosis not present

## 2014-10-26 DIAGNOSIS — Z7982 Long term (current) use of aspirin: Secondary | ICD-10-CM | POA: Diagnosis not present

## 2014-10-26 DIAGNOSIS — I10 Essential (primary) hypertension: Secondary | ICD-10-CM | POA: Insufficient documentation

## 2014-10-26 DIAGNOSIS — R195 Other fecal abnormalities: Secondary | ICD-10-CM

## 2014-10-26 HISTORY — PX: ESOPHAGOGASTRODUODENOSCOPY: SHX5428

## 2014-10-26 LAB — HEMOGLOBIN AND HEMATOCRIT, BLOOD
HCT: 31.5 % — ABNORMAL LOW (ref 36.0–46.0)
Hemoglobin: 9.5 g/dL — ABNORMAL LOW (ref 12.0–15.0)

## 2014-10-26 LAB — PREPARE RBC (CROSSMATCH)

## 2014-10-26 LAB — ABO/RH: ABO/RH(D): A POS

## 2014-10-26 SURGERY — EGD (ESOPHAGOGASTRODUODENOSCOPY)
Anesthesia: Moderate Sedation

## 2014-10-26 MED ORDER — MEPERIDINE HCL 50 MG/ML IJ SOLN
INTRAMUSCULAR | Status: AC
  Filled 2014-10-26: qty 1

## 2014-10-26 MED ORDER — STERILE WATER FOR IRRIGATION IR SOLN
Status: DC | PRN
  Administered 2014-10-26: 14:00:00

## 2014-10-26 MED ORDER — MIDAZOLAM HCL 5 MG/5ML IJ SOLN
INTRAMUSCULAR | Status: DC | PRN
  Administered 2014-10-26 (×2): 2 mg via INTRAVENOUS
  Administered 2014-10-26: 1 mg via INTRAVENOUS

## 2014-10-26 MED ORDER — MIDAZOLAM HCL 5 MG/5ML IJ SOLN
INTRAMUSCULAR | Status: AC
  Filled 2014-10-26: qty 10

## 2014-10-26 MED ORDER — SODIUM CHLORIDE 0.9 % IV SOLN
INTRAVENOUS | Status: DC
  Administered 2014-10-26: 14:00:00 via INTRAVENOUS

## 2014-10-26 MED ORDER — MEPERIDINE HCL 50 MG/ML IJ SOLN
INTRAMUSCULAR | Status: DC | PRN
  Administered 2014-10-26 (×2): 25 mg via INTRAVENOUS

## 2014-10-26 MED ORDER — BUTAMBEN-TETRACAINE-BENZOCAINE 2-2-14 % EX AERO
INHALATION_SPRAY | CUTANEOUS | Status: DC | PRN
  Administered 2014-10-26: 2 via TOPICAL

## 2014-10-26 NOTE — Discharge Instructions (Signed)
Resume usual medications including low-dose aspirin. Resume usual diet. No driving for 24 hours.  Gastrointestinal Endoscopy, Care After Refer to this sheet in the next few weeks. These instructions provide you with information on caring for yourself after your procedure. Your caregiver may also give you more specific instructions. Your treatment has been planned according to current medical practices, but problems sometimes occur. Call your caregiver if you have any problems or questions after your procedure. HOME CARE INSTRUCTIONS  If you were given medicine to help you relax (sedative), do not drive, operate machinery, or sign important documents for 24 hours.  Avoid alcohol and hot or warm beverages for the first 24 hours after the procedure.  Only take over-the-counter or prescription medicines for pain, discomfort, or fever as directed by your caregiver. You may resume taking your normal medicines unless your caregiver tells you otherwise. Ask your caregiver when you may resume taking medicines that may cause bleeding, such as aspirin, clopidogrel, or warfarin.  You may return to your normal diet and activities on the day after your procedure, or as directed by your caregiver. Walking may help to reduce any bloated feeling in your abdomen.  Drink enough fluids to keep your urine clear or pale yellow.  You may gargle with salt water if you have a sore throat. SEEK IMMEDIATE MEDICAL CARE IF:  You have severe nausea or vomiting.  You have severe abdominal pain, abdominal cramps that last longer than 6 hours, or abdominal swelling (distention).  You have severe shoulder or back pain.  You have trouble swallowing.  You have shortness of breath, your breathing is shallow, or you are breathing faster than normal.  You have a fever or a rapid heartbeat.  You vomit blood or material that looks like coffee grounds.  You have bloody, black, or tarry stools. MAKE SURE YOU:  Understand  these instructions.  Will watch your condition.  Will get help right away if you are not doing well or get worse. Document Released: 03/04/2004 Document Revised: 12/05/2013 Document Reviewed: 10/21/2011 [facility] Patient Information 2015 [place], [place]. This information is not intended to replace advice given to you by your health care provider. Make sure you discuss any questions you have with your health care provider.

## 2014-10-26 NOTE — H&P (Signed)
[name] is an 79 y.o. female.   Chief Complaint: Patient is here for EGD. HPI: Patient is 79 year old Caucasian female who was recently evaluated for rectal bleeding and anemia while anticoagulated. She underwent colonoscopy last week with removal of 4 mm polyp. She also had external and internal hemorrhoids. Patient's hemoglobin was checked on day of colonoscopy and was 8.6 and 28.0 Hemoglobin in January was over 12 g in 2 weeks ago was 9.9. Patient's aspirin and Plavix were discontinued. She has not passed any more blood per rectum. She states her stools have been black since she went on iron. She also complains of poor appetite and epigastric pain and she also complains of RLQ pain which she's had off and on for years.  Past Medical History  Diagnosis Date  . Atrial fibrillation   . Coronary atherosclerosis of native coronary artery     DES x 2 to RCA 10/10  . GERD (gastroesophageal reflux disease)   . Tachycardia-bradycardia syndrome     s/p Medtronic Adapta L dual chamber device  5/10  . Hyperlipidemia   . COPD (chronic obstructive pulmonary disease)   . Anxiety   . Resting tremor   . 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 Chronic back pain   . Pericardial effusion     Hemorrhagic   . Essential hypertension, benign   . Headache(784.0)   . H/O hiatal hernia   . Neuromuscular disorder     Tremors    Past Surgical History  Procedure Laterality Date  . Cholecystectomy    . Appendectomy    . Subxiphoid pericardial window  11/10  . Esophagogastroduodenoscopy with esophageal dilation  2004, 2006, 2007  . Vaginal hysterectomy    . Colonoscopy  2011  . Right rotator cuff repair    . Insert / replace / remove pacemaker    . Left heart catheterization with coronary angiogram N/A 11/17/2011    Procedure: LEFT HEART CATHETERIZATION WITH CORONARY ANGIOGRAM;  Surgeon: Sherren Mocha, MD;  Location: [facility] CATH LAB;  Service: Cardiovascular;  Laterality: N/A;  . Left  heart catheterization with coronary angiogram N/A 09/26/2014    Procedure: LEFT HEART CATHETERIZATION WITH CORONARY ANGIOGRAM;  Surgeon: Leonie Man, MD;  Location: [facility] CATH LAB;  Service: Cardiovascular;  Laterality: N/A;  . Colonoscopy N/A 10/19/2014    Procedure: COLONOSCOPY;  Surgeon: Rogene Houston, MD;  Location: [facility] ENDO SUITE;  Service: Endoscopy;  Laterality: N/A;  1030    Family History  Problem Relation Age of Onset  . Cancer Mother     Colon   . Coronary artery disease Sister   . Coronary artery disease Brother   . Arthritis    . Lung disease    . Asthma     Social History:  reports that she has never smoked. She has never used smokeless tobacco. She reports that she does not drink alcohol or use illicit drugs.  Allergies:  Allergies  Allergen Reactions  . Amitriptyline Hcl Other (See Comments)    Caused "jaws to twist and lock"  . Sulfonamide Derivatives Other (See Comments)    UNKNOWN REACTION    Medications Prior to Admission  Medication Sig Dispense Refill  . ALPRAZolam (XANAX) 1 MG tablet Take 1 mg by mouth at bedtime. 1/2 tab at night    . amiodarone (PACERONE) 100 MG tablet Take 1 tablet (100 mg total) by mouth daily. (Patient taking differently: Take 200 mg by mouth daily. ) 90  tablet 3  . Cyanocobalamin (B-12 PO) Take 1 tablet by mouth daily as needed (for supplement).    Marland Kitchen esomeprazole (NEXIUM) 40 MG capsule Take 40 mg by mouth daily.     . furosemide (LASIX) 40 MG tablet Take 40-60 mg by mouth daily as needed (for swelling).     . metoprolol (TOPROL-XL) 50 MG 24 hr tablet Take 75 mg by mouth every morning.     . Multiple Vitamin (MULTIVITAMIN) tablet Take 1 tablet by mouth daily.    . potassium chloride SA (K-DUR,KLOR-CON) 20 MEQ tablet Take 20-40 mEq by mouth daily as needed (Takes 52mEq daily, but increases to 79mEq as needed for cramping.).     Marland Kitchen primidone (MYSOLINE) 50 MG tablet Take 50 mg by mouth 2 (two) times daily as needed (for tremors).     .  thiamine (VITAMIN B-1) 100 MG tablet Take 100 mg by mouth daily.    . valsartan (DIOVAN) 40 MG tablet Take 40 mg by mouth as needed.     Marland Kitchen VITAMIN D, CHOLECALCIFEROL, PO Take 1 capsule by mouth daily.     . vitamin E 400 UNIT capsule Take 400 Units by mouth daily.    Marland Kitchen aspirin EC 81 MG tablet Take 81 mg by mouth daily.    . clopidogrel (PLAVIX) 75 MG tablet Take 75 mg by mouth every morning.    . nitroGLYCERIN (NITROSTAT) 0.4 MG SL tablet Place 0.4 mg under the tongue every 5 (five) minutes as needed for chest pain.       No results found for this or any previous visit (from the past 48 hour(s)). No results found.  ROS  Blood pressure 159/69, pulse 63, temperature 97.7 F (36.5 C), temperature source Oral, resp. rate 20, height 5' 6.5" (1.689 m), weight 158 lb (71.668 kg), SpO2 97 %. Physical Exam  Constitutional: She appears well-developed and well-nourished.  HENT:  Mouth/Throat: Oropharynx is clear and moist.  Conjunctiva is pale and sclera nonicteric  Neck: No thyromegaly present.  Cardiovascular: Normal rate, regular rhythm and normal heart sounds.   No murmur heard. Respiratory: Effort normal and breath sounds normal.  GI:  Abdomen is symmetrical and soft with mild tenderness at epigastrium and RLQ. No organomegaly or masses.     Assessment/Plan Anemia and GI bleed while on anticoagulation. No lesion found on colonoscopy to account for bleeding other than hemorrhoids. Diagnostic EGD. Check H&H and type and screen for 2 units.  REHMAN,NAJEEB U 10/26/2014, 2:13 PM

## 2014-10-26 NOTE — Op Note (Signed)
EGD PROCEDURE REPORT  PATIENT:  [name]  MR#:  [id number] Birthdate:  11-Jul-1936, 79 y.o., female Endoscopist:  Dr. Rogene Houston, MD Referred By:  Dr. Edwyna Ready call, MD  Procedure Date: 10/26/2014  Procedure:   EGD  Indications:  Patient is 79 year old Caucasian female who was recently evaluated for significant drop in H&H and rectal bleeding. She underwent colonoscopy last week with removal of small polyp. She was noted to have internal and external hemorrhoids felt to be source of rectal bleeding. However her hemoglobin has ref from over 12 g in January 2016 to 8.6 last week. She also complains of poor appetite and epigastric pain. She is therefore undergoing diagnostic EGD. Aspirin and Plavix are on hold. Iron studies are consistent with iron deficiency anemia.            Informed Consent:  The risks, benefits, alternatives & imponderables which include, but are not limited to, bleeding, infection, perforation, drug reaction and potential missed lesion have been reviewed.  The potential for biopsy, lesion removal, esophageal dilation, etc. have also been discussed.  Questions have been answered.  All parties agreeable.  Please see history & physical in medical record for more information.  Medications:  Demerol 50 mg IV Versed 5 mg IV Cetacaine spray topically for oropharyngeal anesthesia  Description of procedure:  The endoscope was introduced through the mouth and advanced to the second portion of the duodenum without difficulty or limitations. The mucosal surfaces were surveyed very carefully during advancement of the scope and upon withdrawal.  Findings:  Esophagus:  Mucosa of the esophagus was normal. Incomplete ring noted at GE junction. GEJ:  37 cm Hiatus:  39 cm Stomach:  Stomach was empty and distended very well with insufflation. Folds in the proximal stomach are normal. Examination of mucosa at gastric body, antrum, pyloric channel, angularis, fundus and cardia was  normal. Duodenum:  Normal bulbar and post bulbar mucosa.  Therapeutic/Diagnostic Maneuvers Performed:  None  Complications:  None  Impression: Small sliding hiatal hernia and incomplete noncritical ring at GE junction. No evidence of peptic ulcer disease or other lesions to account for GI bleed.  Comment; No bleeding source identified in upper GI tract. Since iron studies suggest iron deficiency anemia small bowel needs to be evaluated.   Recommendations:  Await results of H&H. If hemoglobin is 8 or less she will be transfused. Patient will resume aspirin at low dose but hold Plavix for now. Will plan small bowel given capsule study next week for which she will have to be monitored since she has pacemaker.  REHMAN,NAJEEB U  10/26/2014  2:40 PM  CC: Dr. Delphina Cahill, MD & Dr. Rayne Du ref. provider found

## 2014-10-30 ENCOUNTER — Other Ambulatory Visit (INDEPENDENT_AMBULATORY_CARE_PROVIDER_SITE_OTHER): Payer: Self-pay | Admitting: *Deleted

## 2014-10-30 ENCOUNTER — Encounter (HOSPITAL_COMMUNITY): Payer: Self-pay | Admitting: Internal Medicine

## 2014-10-30 DIAGNOSIS — Z955 Presence of coronary angioplasty implant and graft: Secondary | ICD-10-CM | POA: Insufficient documentation

## 2014-10-30 LAB — TYPE AND SCREEN
ABO/RH(D): A POS
ANTIBODY SCREEN: NEGATIVE
UNIT DIVISION: 0
Unit division: 0

## 2014-10-31 ENCOUNTER — Encounter (HOSPITAL_COMMUNITY): Payer: Self-pay | Admitting: General Practice

## 2014-10-31 ENCOUNTER — Observation Stay (HOSPITAL_COMMUNITY)
Admission: RE | Admit: 2014-10-31 | Discharge: 2014-10-31 | Disposition: A | Discharge status: Home Health | Payer: Medicare Other | Source: Ambulatory Visit | Attending: Internal Medicine | Admitting: Internal Medicine

## 2014-10-31 ENCOUNTER — Encounter (HOSPITAL_COMMUNITY)
Admission: RE | Disposition: A | Discharge status: Home Health | Payer: Self-pay | Source: Ambulatory Visit | Attending: Internal Medicine

## 2014-10-31 DIAGNOSIS — D500 Iron deficiency anemia secondary to blood loss (chronic): Secondary | ICD-10-CM | POA: Insufficient documentation

## 2014-10-31 DIAGNOSIS — I2510 Atherosclerotic heart disease of native coronary artery without angina pectoris: Secondary | ICD-10-CM | POA: Insufficient documentation

## 2014-10-31 DIAGNOSIS — D649 Anemia, unspecified: Secondary | ICD-10-CM | POA: Diagnosis present

## 2014-10-31 DIAGNOSIS — Z955 Presence of coronary angioplasty implant and graft: Secondary | ICD-10-CM | POA: Insufficient documentation

## 2014-10-31 DIAGNOSIS — Z950 Presence of cardiac pacemaker: Secondary | ICD-10-CM | POA: Diagnosis not present

## 2014-10-31 DIAGNOSIS — Z7982 Long term (current) use of aspirin: Secondary | ICD-10-CM | POA: Insufficient documentation

## 2014-10-31 DIAGNOSIS — Z9049 Acquired absence of other specified parts of digestive tract: Secondary | ICD-10-CM | POA: Diagnosis not present

## 2014-10-31 DIAGNOSIS — I10 Essential (primary) hypertension: Secondary | ICD-10-CM | POA: Diagnosis not present

## 2014-10-31 DIAGNOSIS — K922 Gastrointestinal hemorrhage, unspecified: Principal | ICD-10-CM | POA: Insufficient documentation

## 2014-10-31 DIAGNOSIS — I4891 Unspecified atrial fibrillation: Secondary | ICD-10-CM | POA: Diagnosis not present

## 2014-10-31 DIAGNOSIS — K449 Diaphragmatic hernia without obstruction or gangrene: Secondary | ICD-10-CM | POA: Diagnosis not present

## 2014-10-31 DIAGNOSIS — K5520 Angiodysplasia of colon without hemorrhage: Secondary | ICD-10-CM | POA: Diagnosis not present

## 2014-10-31 DIAGNOSIS — K219 Gastro-esophageal reflux disease without esophagitis: Secondary | ICD-10-CM | POA: Diagnosis not present

## 2014-10-31 DIAGNOSIS — Z90710 Acquired absence of both cervix and uterus: Secondary | ICD-10-CM | POA: Insufficient documentation

## 2014-10-31 DIAGNOSIS — G709 Myoneural disorder, unspecified: Secondary | ICD-10-CM | POA: Insufficient documentation

## 2014-10-31 HISTORY — PX: GIVENS CAPSULE STUDY: SHX5432

## 2014-10-31 LAB — CBC
HCT: 34.6 % — ABNORMAL LOW (ref 36.0–46.0)
HEMOGLOBIN: 10.5 g/dL — AB (ref 12.0–15.0)
MCH: 28.2 pg (ref 26.0–34.0)
MCHC: 30.3 g/dL (ref 30.0–36.0)
MCV: 92.8 fL (ref 78.0–100.0)
PLATELETS: 254 10*3/uL (ref 150–400)
RBC: 3.73 MIL/uL — ABNORMAL LOW (ref 3.87–5.11)
RDW: 13.6 % (ref 11.5–15.5)
WBC: 5.6 10*3/uL (ref 4.0–10.5)

## 2014-10-31 SURGERY — IMAGING PROCEDURE, GI TRACT, INTRALUMINAL, VIA CAPSULE

## 2014-10-31 MED ORDER — SODIUM CHLORIDE 0.9 % IV SOLN: 250.0000 mL | INTRAVENOUS | Status: DC | PRN

## 2014-10-31 MED ORDER — SODIUM CHLORIDE 0.9 % IJ SOLN: 3.0000 mL | Freq: Two times a day (BID) | INTRAMUSCULAR | Status: DC

## 2014-10-31 MED ORDER — SODIUM CHLORIDE 0.9 % IJ SOLN: 3.0000 mL | INTRAMUSCULAR | Status: DC | PRN

## 2014-10-31 NOTE — H&P (Signed)
[name] is an 79 y.o. female.   Chief Complaint: Patient is here for small bowel given capsule study. HPI: Patient is 79 year old Caucasian female who was evaluated recently for rectal bleeding and anemia and underwent colonoscopy about 2 weeks ago and no bleeding lesion is identified. Her hemoglobin dropped from over 12 g in January this year to 8.6. Iron studies confirmed on deficiency anemia. Last week she return for esophagogastroduodenoscopy and once again no bleeding lesion identified. He is therefore suspected to be losing blood from small bowel. Her hemoglobin has come from 8.6 to 9.5 last week. Since she has been off Plavix she has stopped passing blood per rectum. She was begun on aspirin last week. She has intermittent right lower quadrant abdominal pain. She continues to feel weak and tired.  Past Medical History  Diagnosis Date  . Atrial fibrillation   . Coronary atherosclerosis of native coronary artery     DES x 2 to RCA 10/10  . GERD (gastroesophageal reflux disease)   . Tachycardia-bradycardia syndrome     s/p Medtronic Adapta L dual chamber device  5/10  . Hyperlipidemia   . COPD (chronic obstructive pulmonary disease)   . Anxiety   . Resting tremor   . 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 Chronic back pain   . Pericardial effusion     Hemorrhagic   . Essential hypertension, benign   . Headache(784.0)   . H/O hiatal hernia   . Neuromuscular disorder     Tremors    Past Surgical History  Procedure Laterality Date  . Cholecystectomy    . Appendectomy    . Subxiphoid pericardial window  11/10  . Esophagogastroduodenoscopy with esophageal dilation  2004, 2006, 2007  . Vaginal hysterectomy    . Colonoscopy  2011  . Right rotator cuff repair    . Insert / replace / remove pacemaker    . Left heart catheterization with coronary angiogram N/A 11/17/2011    Procedure: LEFT HEART CATHETERIZATION WITH CORONARY ANGIOGRAM;  Surgeon: Sherren Mocha,  MD;  Location: [facility] CATH LAB;  Service: Cardiovascular;  Laterality: N/A;  . Left heart catheterization with coronary angiogram N/A 09/26/2014    Procedure: LEFT HEART CATHETERIZATION WITH CORONARY ANGIOGRAM;  Surgeon: Leonie Man, MD;  Location: [facility] CATH LAB;  Service: Cardiovascular;  Laterality: N/A;  . Colonoscopy N/A 10/19/2014    Procedure: COLONOSCOPY;  Surgeon: Rogene Houston, MD;  Location: [facility] ENDO [facility];  Service: Endoscopy;  Laterality: N/A;  1030  . Esophagogastroduodenoscopy N/A 10/26/2014    Procedure: ESOPHAGOGASTRODUODENOSCOPY (EGD);  Surgeon: Rogene Houston, MD;  Location: [facility] ENDO SUITE;  Service: Endoscopy;  Laterality: N/A;  42 - Dr. has lunch and learn    Family History  Problem Relation Age of Onset  . Cancer Mother     Colon   . Coronary artery disease Sister   . Coronary artery disease Brother   . Arthritis    . Lung disease    . Asthma     Social History:  reports that she has never smoked. She has never used smokeless tobacco. She reports that she does not drink alcohol or use illicit drugs.  Allergies:  Allergies  Allergen Reactions  . Amitriptyline Hcl Other (See Comments)    Caused "jaws to twist and lock"  . Sulfonamide Derivatives Other (See Comments)    UNKNOWN REACTION    Medications Prior to Admission  Medication Sig Dispense Refill  . ALPRAZolam (  XANAX) 1 MG tablet Take 1 mg by mouth at bedtime. 1/2 tab at night    . amiodarone (PACERONE) 100 MG tablet Take 1 tablet (100 mg total) by mouth daily. (Patient taking differently: Take 200 mg by mouth daily. ) 90 tablet 3  . aspirin EC 81 MG tablet Take 81 mg by mouth daily.    . Cyanocobalamin (B-12 PO) Take 1 tablet by mouth daily as needed (for supplement).    Marland Kitchen esomeprazole (NEXIUM) 40 MG capsule Take 40 mg by mouth daily.     . furosemide (LASIX) 40 MG tablet Take 40-60 mg by mouth daily as needed (for swelling).     . metoprolol (TOPROL-XL) 50 MG 24 hr tablet Take 75 mg by mouth every morning.      . Multiple Vitamin (MULTIVITAMIN) tablet Take 1 tablet by mouth daily.    . nitroGLYCERIN (NITROSTAT) 0.4 MG SL tablet Place 0.4 mg under the tongue every 5 (five) minutes as needed for chest pain.     . potassium chloride SA (K-DUR,KLOR-CON) 20 MEQ tablet Take 20-40 mEq by mouth daily as needed (Takes 60mEq daily, but increases to 75mEq as needed for cramping.).     Marland Kitchen primidone (MYSOLINE) 50 MG tablet Take 50 mg by mouth 2 (two) times daily as needed (for tremors).     . thiamine (VITAMIN B-1) 100 MG tablet Take 100 mg by mouth daily.    Marland Kitchen VITAMIN D, CHOLECALCIFEROL, PO Take 1 capsule by mouth daily.     . vitamin E 400 UNIT capsule Take 400 Units by mouth daily.    . valsartan (DIOVAN) 40 MG tablet Take 40 mg by mouth as needed.       No results found for this or any previous visit (from the past 48 hour(s)). No results found.  ROS  Height 5\' 6"  (1.676 m), weight 148 lb (67.132 kg). Physical Exam  Constitutional: She appears well-developed and well-nourished.  HENT:  Mouth/Throat: Oropharynx is clear and moist.  Eyes: Conjunctivae are normal. No scleral icterus.  Neck: No thyromegaly present.  GI: Soft. She exhibits no distension. There is tenderness (mild tenderness in RLQ). There is no guarding.  Musculoskeletal: She exhibits no edema.  She has tremors to both hands..  Lymphadenopathy:    She has no cervical adenopathy.  Neurological: She is alert.  Skin: Skin is warm.     Assessment/Plan Iron deficiency anemia secondary to GI bleed of occult origin. He has undergone colonoscopy followed by EGD and bleeding lesion not found. I do not believe hemorrhoidal bleeding with explain her anemia and  INR in deficiency He is possibly losing blood from her small bowel. He is therefore undergoing small bowel given capsule study and will need to be monitored for 8 hour since she has permanent pacemaker. Will check H&H today.  Fateh Kindle U 10/31/2014, 8:06 AM

## 2014-10-31 NOTE — Progress Notes (Signed)
Patient with orders to be discharge home. Discharge instructions given. Patient stable. Patient left in private vehicle with family.

## 2014-11-01 ENCOUNTER — Encounter (HOSPITAL_COMMUNITY): Payer: Self-pay | Admitting: Internal Medicine

## 2014-11-02 ENCOUNTER — Telehealth (INDEPENDENT_AMBULATORY_CARE_PROVIDER_SITE_OTHER): Payer: Self-pay | Admitting: *Deleted

## 2014-11-02 NOTE — Telephone Encounter (Signed)
Patient was called and a message left that Dr.Rehman was going to call her later today to discuss results.

## 2014-11-04 NOTE — Op Note (Addendum)
Small Bowel Givens Capsule Study Procedure date:  10/31/2014  Referring Provider:  Delphina Cahill, MD PCP:  Delphina Cahill, MD Indication for procedure: Patient is 79 year old Caucasian female with history of GI bleed and iron deficiency anemia who underwent colonoscopy followed by esophagogastroduodenoscopy and no lesion found to account for GI blood loss. She is therefore undergoing small bowel Given capsule study and will be monitored since she has permanent pacemaker in place.    Findings:  Multiple small AV malformations noted and can be seen on the following frames. 2 hours 32 minutes and 47 seconds. 4 hours and 37 minutes. 4 hours 38 minutes and 38 seconds. 4 hours 38 minutes and 54 seconds. 4 hours 38 minutes and 59 seconds.      First image for a cavity is at 17 seconds First Gastric image:  1 hour 59 minutes and 27 seconds First Duodenal image: 2 hours 14 minutes and 26 seconds First Ileo-Cecal Valve image: 4 hours 47 minutes and 48 seconds First Cecal image: 4 hours 48 minutes and 29 seconds Gastric Passage time: 14 minutes Small Bowel Passage time:  2 hours and 37 minutes  Summary & Recommendations: Prolonged esophageal transmit time of given capsule consistent with patient's history of esophageal motility disorder. Multiple small bowel angiodysplasia without active bleeding. Patient advised to resume clopidogrel. Patient advised to check with Dr. Delphina Cahill if she could come off aspirin. CBC next week. If she has another episode of active bleeding will consider GI bleeding scan.

## 2014-11-08 ENCOUNTER — Telehealth (INDEPENDENT_AMBULATORY_CARE_PROVIDER_SITE_OTHER): Payer: Self-pay | Admitting: *Deleted

## 2014-11-08 DIAGNOSIS — K922 Gastrointestinal hemorrhage, unspecified: Secondary | ICD-10-CM | POA: Diagnosis not present

## 2014-11-08 DIAGNOSIS — K625 Hemorrhage of anus and rectum: Secondary | ICD-10-CM

## 2014-11-08 DIAGNOSIS — Z6825 Body mass index (BMI) 25.0-25.9, adult: Secondary | ICD-10-CM | POA: Diagnosis not present

## 2014-11-08 DIAGNOSIS — K224 Dyskinesia of esophagus: Secondary | ICD-10-CM | POA: Diagnosis not present

## 2014-11-08 DIAGNOSIS — I509 Heart failure, unspecified: Secondary | ICD-10-CM | POA: Diagnosis not present

## 2014-11-08 DIAGNOSIS — D509 Iron deficiency anemia, unspecified: Secondary | ICD-10-CM | POA: Diagnosis not present

## 2014-11-08 DIAGNOSIS — D649 Anemia, unspecified: Secondary | ICD-10-CM | POA: Diagnosis not present

## 2014-11-08 NOTE — Telephone Encounter (Signed)
Per Dr.Rehman the patient will need to have labs drawn. 

## 2014-11-15 ENCOUNTER — Encounter: Payer: Medicare Other | Admitting: Internal Medicine

## 2014-11-16 ENCOUNTER — Telehealth (INDEPENDENT_AMBULATORY_CARE_PROVIDER_SITE_OTHER): Payer: Self-pay | Admitting: *Deleted

## 2014-11-16 ENCOUNTER — Encounter (INDEPENDENT_AMBULATORY_CARE_PROVIDER_SITE_OTHER): Payer: Self-pay | Admitting: *Deleted

## 2014-11-16 DIAGNOSIS — K625 Hemorrhage of anus and rectum: Secondary | ICD-10-CM

## 2014-11-16 NOTE — Telephone Encounter (Signed)
Patient was called and a message was left with the following results. H&H down slightly but Serum Iron is up. Will repeat CBC in 2 weeks - Per Dr.Rehman.  The lab will be due 11/28/14. A letter was mailed to the patient as a reminder.

## 2014-11-16 NOTE — Telephone Encounter (Signed)
Per Dr.Rehman the patient will need to have labs drawn in 2 weeks. 

## 2014-11-16 NOTE — Telephone Encounter (Signed)
Jeff would like for Tammy to please give her a call at [phone].

## 2014-11-16 NOTE — Telephone Encounter (Signed)
Patient was called to go over the results of her lab work and to make sure that it was to be done again on 11/28/14. Patient was given the information.

## 2014-11-23 ENCOUNTER — Encounter (INDEPENDENT_AMBULATORY_CARE_PROVIDER_SITE_OTHER): Payer: Self-pay

## 2014-11-24 DIAGNOSIS — M545 Low back pain: Secondary | ICD-10-CM | POA: Diagnosis not present

## 2014-11-24 DIAGNOSIS — I10 Essential (primary) hypertension: Secondary | ICD-10-CM | POA: Diagnosis not present

## 2014-11-24 DIAGNOSIS — I2510 Atherosclerotic heart disease of native coronary artery without angina pectoris: Secondary | ICD-10-CM | POA: Diagnosis not present

## 2014-11-24 DIAGNOSIS — E782 Mixed hyperlipidemia: Secondary | ICD-10-CM | POA: Diagnosis not present

## 2014-11-24 DIAGNOSIS — D519 Vitamin B12 deficiency anemia, unspecified: Secondary | ICD-10-CM | POA: Diagnosis not present

## 2014-11-24 DIAGNOSIS — D509 Iron deficiency anemia, unspecified: Secondary | ICD-10-CM | POA: Diagnosis not present

## 2014-11-28 DIAGNOSIS — K625 Hemorrhage of anus and rectum: Secondary | ICD-10-CM | POA: Diagnosis not present

## 2014-11-28 LAB — CBC
HEMATOCRIT: 30.5 % — AB (ref 36.0–46.0)
HEMOGLOBIN: 9.5 g/dL — AB (ref 12.0–15.0)
MCH: 26 pg (ref 26.0–34.0)
MCHC: 31.1 g/dL (ref 30.0–36.0)
MCV: 83.3 fL (ref 78.0–100.0)
MPV: 9.8 fL (ref 8.6–12.4)
PLATELETS: 260 10*3/uL (ref 150–400)
RBC: 3.66 MIL/uL — AB (ref 3.87–5.11)
RDW: 14.4 % (ref 11.5–15.5)
WBC: 5.5 10*3/uL (ref 4.0–10.5)

## 2014-11-29 ENCOUNTER — Telehealth (INDEPENDENT_AMBULATORY_CARE_PROVIDER_SITE_OTHER): Payer: Self-pay | Admitting: *Deleted

## 2014-11-29 ENCOUNTER — Encounter (INDEPENDENT_AMBULATORY_CARE_PROVIDER_SITE_OTHER): Payer: Self-pay | Admitting: *Deleted

## 2014-11-29 DIAGNOSIS — D649 Anemia, unspecified: Secondary | ICD-10-CM

## 2014-11-29 DIAGNOSIS — K625 Hemorrhage of anus and rectum: Secondary | ICD-10-CM

## 2014-11-29 NOTE — Telephone Encounter (Signed)
Per Dr.Rehman the patient will need to have labs drawn 12/06/14.

## 2014-11-30 ENCOUNTER — Encounter (INDEPENDENT_AMBULATORY_CARE_PROVIDER_SITE_OTHER): Payer: Self-pay | Admitting: *Deleted

## 2014-12-06 DIAGNOSIS — D649 Anemia, unspecified: Secondary | ICD-10-CM | POA: Diagnosis not present

## 2014-12-06 DIAGNOSIS — K625 Hemorrhage of anus and rectum: Secondary | ICD-10-CM | POA: Diagnosis not present

## 2014-12-07 LAB — HEMOGLOBIN AND HEMATOCRIT, BLOOD
HEMATOCRIT: 32.2 % — AB (ref 36.0–46.0)
HEMOGLOBIN: 10.1 g/dL — AB (ref 12.0–15.0)

## 2014-12-08 DIAGNOSIS — I509 Heart failure, unspecified: Secondary | ICD-10-CM | POA: Diagnosis not present

## 2014-12-11 ENCOUNTER — Telehealth (INDEPENDENT_AMBULATORY_CARE_PROVIDER_SITE_OTHER): Payer: Self-pay | Admitting: *Deleted

## 2014-12-11 DIAGNOSIS — K625 Hemorrhage of anus and rectum: Secondary | ICD-10-CM

## 2014-12-11 NOTE — Telephone Encounter (Signed)
Per Dr.Rehman the patient will need to have labs drawn in 2 weeks H&H.

## 2014-12-12 ENCOUNTER — Encounter (INDEPENDENT_AMBULATORY_CARE_PROVIDER_SITE_OTHER): Payer: Self-pay | Admitting: *Deleted

## 2014-12-12 ENCOUNTER — Other Ambulatory Visit (INDEPENDENT_AMBULATORY_CARE_PROVIDER_SITE_OTHER): Payer: Self-pay | Admitting: *Deleted

## 2014-12-12 DIAGNOSIS — K625 Hemorrhage of anus and rectum: Secondary | ICD-10-CM

## 2014-12-14 NOTE — Telephone Encounter (Signed)
This encounter was created in error - please disregard.

## 2014-12-25 DIAGNOSIS — K625 Hemorrhage of anus and rectum: Secondary | ICD-10-CM | POA: Diagnosis not present

## 2014-12-25 LAB — HEMOGLOBIN AND HEMATOCRIT, BLOOD
HCT: 33.2 % — ABNORMAL LOW (ref 36.0–46.0)
Hemoglobin: 10.6 g/dL — ABNORMAL LOW (ref 12.0–15.0)

## 2014-12-28 ENCOUNTER — Encounter (INDEPENDENT_AMBULATORY_CARE_PROVIDER_SITE_OTHER): Payer: Self-pay | Admitting: *Deleted

## 2014-12-28 ENCOUNTER — Telehealth (INDEPENDENT_AMBULATORY_CARE_PROVIDER_SITE_OTHER): Payer: Self-pay | Admitting: *Deleted

## 2014-12-28 DIAGNOSIS — D508 Other iron deficiency anemias: Secondary | ICD-10-CM

## 2014-12-28 NOTE — Telephone Encounter (Signed)
Per Dr.Rehman the patient will need to have labs drawn in 4 weeks.   

## 2015-01-08 DIAGNOSIS — I509 Heart failure, unspecified: Secondary | ICD-10-CM | POA: Diagnosis not present

## 2015-01-25 DIAGNOSIS — D508 Other iron deficiency anemias: Secondary | ICD-10-CM | POA: Diagnosis not present

## 2015-01-26 LAB — HEMOGLOBIN AND HEMATOCRIT, BLOOD
HEMATOCRIT: 36 % (ref 36.0–46.0)
Hemoglobin: 11.2 g/dL — ABNORMAL LOW (ref 12.0–15.0)

## 2015-01-29 ENCOUNTER — Other Ambulatory Visit: Payer: Self-pay

## 2015-02-07 DIAGNOSIS — I509 Heart failure, unspecified: Secondary | ICD-10-CM | POA: Diagnosis not present

## 2015-02-27 ENCOUNTER — Ambulatory Visit (INDEPENDENT_AMBULATORY_CARE_PROVIDER_SITE_OTHER): Payer: Medicare Other | Admitting: Internal Medicine

## 2015-02-27 ENCOUNTER — Encounter (INDEPENDENT_AMBULATORY_CARE_PROVIDER_SITE_OTHER): Payer: Self-pay | Admitting: Internal Medicine

## 2015-02-27 VITALS — BP 140/90 | HR 84 | Temp 98.7°F | Resp 22 | Ht 66.5 in | Wt 162.0 lb

## 2015-02-27 DIAGNOSIS — K922 Gastrointestinal hemorrhage, unspecified: Secondary | ICD-10-CM | POA: Diagnosis not present

## 2015-02-27 DIAGNOSIS — K224 Dyskinesia of esophagus: Secondary | ICD-10-CM

## 2015-02-27 DIAGNOSIS — D509 Iron deficiency anemia, unspecified: Secondary | ICD-10-CM | POA: Diagnosis not present

## 2015-02-27 NOTE — Patient Instructions (Signed)
Hemoccult 1 when stool dark or black.

## 2015-02-27 NOTE — Progress Notes (Signed)
Presenting complaint;  Follow-up for iron deficiency anemia and GI bleed. Patient complains of dysphagia and right-sided abdominal pain.  Database:  Patient is 79 year old Caucasian female with multiple medical problems who was evaluated back in March and April this year for GI bleed and anemia. She underwent colonoscopy EGD as well as small bowel given capsule study. Patient has been on Plavix and aspirin. She was felt to be bleeding from small bowel angiodysplasia. Antiplatelets agents were held for few days and she was begun on by mouth iron. She has been having H&H every month. Aspirin has been discontinued and she is back on Plavix.   Subjective:  Patient denies melena or rectal bleeding. She states that time stools have been dark. She continues to complain of pain in right low quadrant which she's had for several months. She remains with dysphagia with solids with food bolus eventually goes down. Her appetite is fair. She has gained 4 pounds since her last visit of 10/11/2014. She states her bowels move every morning after she has a cup of coffee. She has multiple non-GI complaints which include back pain and bilateral hip pain. She is supposed to undergo injections to her back to be arranged by Dr. Delphina Cahill. He continues to complain of tremors and fatigue. She has not seen a neurologist since her neurologist Dr. Erling Cruz retired.     Current Medications: Outpatient Encounter Prescriptions as of 02/27/2015  Medication Sig  . ALPRAZolam (XANAX) 1 MG tablet Take by mouth at bedtime. 1/2 tab at night  . amiodarone (PACERONE) 100 MG tablet Take 1 tablet (100 mg total) by mouth daily. (Patient taking differently: Take 200 mg by mouth daily. )  . clopidogrel (PLAVIX) 75 MG tablet Take 75 mg by mouth.   . Cyanocobalamin (B-12 PO) Take 1 tablet by mouth daily as needed (for supplement).  Marland Kitchen esomeprazole (NEXIUM) 40 MG capsule Take 40 mg by mouth daily.   . furosemide (LASIX) 40 MG tablet Take  40-60 mg by mouth daily as needed (for swelling).   . metoprolol (TOPROL-XL) 50 MG 24 hr tablet Take 75 mg by mouth every morning.   . nitroGLYCERIN (NITROSTAT) 0.4 MG SL tablet Place 0.4 mg under the tongue every 5 (five) minutes as needed for chest pain.   . potassium chloride SA (K-DUR,KLOR-CON) 20 MEQ tablet Take 20-40 mEq by mouth daily as needed (Takes 65mEq daily, but increases to 34mEq as needed for cramping.).   Marland Kitchen primidone (MYSOLINE) 50 MG tablet Take 50 mg by mouth 2 (two) times daily as needed (for tremors).   . thiamine (VITAMIN B-1) 100 MG tablet Take 100 mg by mouth daily.  . valsartan (DIOVAN) 40 MG tablet Take 40 mg by mouth as needed.   Marland Kitchen VITAMIN D, CHOLECALCIFEROL, PO Take 1 capsule by mouth daily.   . vitamin E 400 UNIT capsule Take 400 Units by mouth daily.  . [DISCONTINUED] aspirin EC 81 MG tablet Take 81 mg by mouth daily.  . [DISCONTINUED] Multiple Vitamin (MULTIVITAMIN) tablet Take 1 tablet by mouth daily.   No facility-administered encounter medications on file as of 02/27/2015.     Objective: Blood pressure 140/90, pulse 84, temperature 98.7 F (37.1 C), temperature source Oral, resp. rate 22, height 5' 6.5" (1.689 m), weight 162 lb (73.483 kg). Patient is alert and in no acute distress. Her voice is hoarse and she has tremors to her head and upper extremities. Conjunctiva is pink. Sclera is nonicteric Oropharyngeal mucosa is normal. No neck masses or  thyromegaly noted. Cardiac exam with regular rhythm normal S1 and S2. No murmur or gallop noted. Lungs are clear to auscultation. Abdomen is symmetrical. Bowel sounds are normal. No bruits noted. On palpation abdomen is soft with mild tenderness at RLQ without guarding. No organomegaly or masses. No LE edema or clubbing noted.  Labs/studies Results: H&H from 01/25/2015 is 11.2 and 36.0.    Assessment:  #1. Iron deficiency anemia secondary to chronic/intermittent small bowel bleed most likely due to small  bowel angiodysplasia. Hemoglobin has improved from  8.6 to11.2 g. She appears to be doing better with single antiplatelet agent. If she has another episode of significant GI bleed she would need to be referred for small bowel endoscopy. #2. Dysphagia. She has chronic dysphagia primarily to solids felt to be due to esophageal motility disorder. #3. Chronic right low quadrant abdominal pain felt to be either referred from her back or IBS.   Plan:  Hemoccults 1 when stools are dark. Patient will have H&H with plan blood work with Dr. Nevada Crane next week. Office visit in 6 months unless bleeding recurs.

## 2015-03-05 ENCOUNTER — Encounter (HOSPITAL_COMMUNITY): Payer: Self-pay | Admitting: *Deleted

## 2015-03-05 ENCOUNTER — Emergency Department (HOSPITAL_COMMUNITY): Payer: Medicare Other

## 2015-03-05 ENCOUNTER — Emergency Department (HOSPITAL_COMMUNITY)
Admission: EM | Admit: 2015-03-05 | Discharge: 2015-03-05 | Disposition: A | Discharge status: Home / Self Care | Payer: Medicare Other | Attending: Emergency Medicine | Admitting: Emergency Medicine

## 2015-03-05 DIAGNOSIS — R109 Unspecified abdominal pain: Secondary | ICD-10-CM | POA: Diagnosis not present

## 2015-03-05 DIAGNOSIS — H9190 Unspecified hearing loss, unspecified ear: Secondary | ICD-10-CM | POA: Diagnosis not present

## 2015-03-05 DIAGNOSIS — I10 Essential (primary) hypertension: Secondary | ICD-10-CM | POA: Insufficient documentation

## 2015-03-05 DIAGNOSIS — J449 Chronic obstructive pulmonary disease, unspecified: Secondary | ICD-10-CM | POA: Insufficient documentation

## 2015-03-05 DIAGNOSIS — R079 Chest pain, unspecified: Secondary | ICD-10-CM | POA: Diagnosis not present

## 2015-03-05 DIAGNOSIS — R251 Tremor, unspecified: Secondary | ICD-10-CM | POA: Insufficient documentation

## 2015-03-05 DIAGNOSIS — G8929 Other chronic pain: Secondary | ICD-10-CM | POA: Insufficient documentation

## 2015-03-05 DIAGNOSIS — F419 Anxiety disorder, unspecified: Secondary | ICD-10-CM | POA: Diagnosis not present

## 2015-03-05 DIAGNOSIS — K5900 Constipation, unspecified: Secondary | ICD-10-CM

## 2015-03-05 DIAGNOSIS — Z950 Presence of cardiac pacemaker: Secondary | ICD-10-CM | POA: Insufficient documentation

## 2015-03-05 DIAGNOSIS — K2100 Gastro-esophageal reflux disease with esophagitis, without bleeding: Secondary | ICD-10-CM

## 2015-03-05 DIAGNOSIS — Z9049 Acquired absence of other specified parts of digestive tract: Secondary | ICD-10-CM | POA: Insufficient documentation

## 2015-03-05 DIAGNOSIS — R0682 Tachypnea, not elsewhere classified: Secondary | ICD-10-CM | POA: Diagnosis not present

## 2015-03-05 DIAGNOSIS — Z79899 Other long term (current) drug therapy: Secondary | ICD-10-CM | POA: Diagnosis not present

## 2015-03-05 DIAGNOSIS — Z9889 Other specified postprocedural states: Secondary | ICD-10-CM | POA: Insufficient documentation

## 2015-03-05 DIAGNOSIS — I2510 Atherosclerotic heart disease of native coronary artery without angina pectoris: Secondary | ICD-10-CM | POA: Diagnosis not present

## 2015-03-05 DIAGNOSIS — Z9861 Coronary angioplasty status: Secondary | ICD-10-CM | POA: Diagnosis not present

## 2015-03-05 DIAGNOSIS — R110 Nausea: Secondary | ICD-10-CM | POA: Diagnosis present

## 2015-03-05 LAB — CBC WITH DIFFERENTIAL/PLATELET
BASOS PCT: 0 % (ref 0–1)
Basophils Absolute: 0 10*3/uL (ref 0.0–0.1)
EOS ABS: 0.1 10*3/uL (ref 0.0–0.7)
Eosinophils Relative: 1 % (ref 0–5)
HCT: 36.2 % (ref 36.0–46.0)
Hemoglobin: 11 g/dL — ABNORMAL LOW (ref 12.0–15.0)
Lymphocytes Relative: 24 % (ref 12–46)
Lymphs Abs: 1.6 10*3/uL (ref 0.7–4.0)
MCH: 25.2 pg — AB (ref 26.0–34.0)
MCHC: 30.4 g/dL (ref 30.0–36.0)
MCV: 82.8 fL (ref 78.0–100.0)
MONOS PCT: 11 % (ref 3–12)
Monocytes Absolute: 0.8 10*3/uL (ref 0.1–1.0)
Neutro Abs: 4.4 10*3/uL (ref 1.7–7.7)
Neutrophils Relative %: 64 % (ref 43–77)
Platelets: 208 10*3/uL (ref 150–400)
RBC: 4.37 MIL/uL (ref 3.87–5.11)
RDW: 20.2 % — ABNORMAL HIGH (ref 11.5–15.5)
WBC: 6.9 10*3/uL (ref 4.0–10.5)

## 2015-03-05 LAB — COMPREHENSIVE METABOLIC PANEL
ALBUMIN: 3.8 g/dL (ref 3.5–5.0)
ALT: 15 U/L (ref 14–54)
AST: 23 U/L (ref 15–41)
Alkaline Phosphatase: 76 U/L (ref 38–126)
Anion gap: 9 (ref 5–15)
BILIRUBIN TOTAL: 0.3 mg/dL (ref 0.3–1.2)
BUN: 12 mg/dL (ref 6–20)
CO2: 30 mmol/L (ref 22–32)
Calcium: 9.1 mg/dL (ref 8.9–10.3)
Chloride: 101 mmol/L (ref 101–111)
Creatinine, Ser: 0.79 mg/dL (ref 0.44–1.00)
GFR calc Af Amer: >60 mL/min (ref >60)
GLUCOSE: 105 mg/dL — AB (ref 65–99)
Potassium: 4.1 mmol/L (ref 3.5–5.1)
SODIUM: 140 mmol/L (ref 135–145)
TOTAL PROTEIN: 7.9 g/dL (ref 6.5–8.1)

## 2015-03-05 LAB — TROPONIN I

## 2015-03-05 MED ORDER — ASPIRIN 81 MG PO CHEW
324.0000 mg | CHEWABLE_TABLET | Freq: Once | ORAL | Status: AC
  Administered 2015-03-05: 324 mg via ORAL
  Filled 2015-03-05: qty 4

## 2015-03-05 MED ORDER — ESOMEPRAZOLE MAGNESIUM 40 MG PO CPDR: DELAYED_RELEASE_CAPSULE | ORAL | Status: DC

## 2015-03-05 MED ORDER — NITROGLYCERIN 2 % TD OINT
1.0000 [in_us] | TOPICAL_OINTMENT | Freq: Once | TRANSDERMAL | Status: AC
  Administered 2015-03-05: 1 [in_us] via TOPICAL
  Filled 2015-03-05: qty 1

## 2015-03-05 MED ORDER — FAMOTIDINE 20 MG PO TABS
20.0000 mg | ORAL_TABLET | Freq: Once | ORAL | Status: AC
  Administered 2015-03-05: 20 mg via ORAL
  Filled 2015-03-05: qty 1

## 2015-03-05 MED ORDER — GI COCKTAIL ~~LOC~~
30.0000 mL | Freq: Once | ORAL | Status: AC
  Administered 2015-03-05: 30 mL via ORAL
  Filled 2015-03-05: qty 30

## 2015-03-05 NOTE — ED Notes (Addendum)
Pt c/o indigestion for the past two days, has tried Nexium with a little bit of relief, pt describes the indigestion as "burning" sensation, pt also reports that she has been "sweating" some as well with the indigestion.

## 2015-03-05 NOTE — ED Provider Notes (Signed)
CSN: [id number]     Arrival date & time 03/05/15  0225 History   First MD Initiated Contact with Patient 03/05/15 [phone]     Chief Complaint  Patient presents with  . Gastrophageal Reflux     (Consider location/radiation/quality/duration/timing/severity/associated sxs/prior Treatment) HPI patient reports she has not been able to have a bowel movement for the past 2 days. She has a long-standing history of constipation. She states tonight she was trying to strain and got diaphoretic. She states she took duralax (? ducolax) earlier today without results. She has mild nausea without vomiting. She also states she woke up with diaphoresis this morning and was having indigestion which she describes as a burning sensation in her chest going up to her throat. She also had it all day yesterday. She denies any relation of the indigestion to any food she eats. She states she took a Nexium at 1 AM without improvement. She states it feels like her reflux but she also has a history of cardiac stents and it also could be similar to that. She denies fever. She states she's had a mild cough "now and then". She states she feels very weak.  She was seen by her gastroenterologist couple days ago. She was having problems with blood in her stools and she states she has a small intestinal bleeding area. She states her hemoglobin has gotten so low she had to have a blood transfusion.  She states she's scheduled to have blood work later today at her doctor's office in anticipation of her cardiology visit. She states she has a history of a pacemaker, cardiac stents, and has had a pericardial window.  PCP Dr Merlyn Albert Cardiology Dr Lovena Le GI Dr Laural Golden  Past Medical History  Diagnosis Date  . Atrial fibrillation   . Coronary atherosclerosis of native coronary artery     DES x 2 to RCA 10/10  . GERD (gastroesophageal reflux disease)   . Tachycardia-bradycardia syndrome     s/p Medtronic Adapta L dual chamber device  5/10   . Hyperlipidemia   . COPD (chronic obstructive pulmonary disease)   . Anxiety   . Resting tremor   . 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 Chronic back pain   . Pericardial effusion     Hemorrhagic   . Essential hypertension, benign   . Headache(784.0)   . H/O hiatal hernia   . Neuromuscular disorder     Tremors   Past Surgical History  Procedure Laterality Date  . Cholecystectomy    . Appendectomy    . Subxiphoid pericardial window  11/10  . Esophagogastroduodenoscopy with esophageal dilation  2004, 2006, 2007  . Vaginal hysterectomy    . Colonoscopy  2011  . Right rotator cuff repair    . Insert / replace / remove pacemaker    . Left heart catheterization with coronary angiogram N/A 11/17/2011    Procedure: LEFT HEART CATHETERIZATION WITH CORONARY ANGIOGRAM;  Surgeon: Sherren Mocha, MD;  Location[facility] CATH LAB;  Service: Cardiovascular;  Laterality: N/A;  . Left heart catheterization with coronary angiogram N/A 09/26/2014    Procedure: LEFT HEART CATHETERIZATION WITH CORONARY ANGIOGRAM;  Surgeon: Leonie Man, MD;  Location: [facility] CATH LAB;  Service: Cardiovascular;  Laterality: N/A;  . Colonoscopy N/A 10/19/2014    Procedure: COLONOSCOPY;  Surgeon: Rogene Houston, MD;  Location: [facility] ENDO SUITE;  Service: Endoscopy;  Laterality: N/A;  1030  . Esophagogastroduodenoscopy N/A 10/26/2014    Procedure: ESOPHAGOGASTRODUODENOSCOPY (  EGD);  Surgeon: Rogene Houston, MD;  Location: [facility] ENDO SUITE;  Service: Endoscopy;  Laterality: N/A;  230 - Dr. has lunch and learn  . Givens capsule study N/A 10/31/2014    Procedure: GIVENS CAPSULE STUDY;  Surgeon: Rogene Houston, MD;  Location: [facility] ENDO SUITE;  Service: Endoscopy;  Laterality: N/A;  730 -- pacemaker--needs monitoring--outpatient bed   Family History  Problem Relation Age of Onset  . Cancer Mother     Colon   . Coronary artery disease Sister   . Coronary artery disease Brother   . Arthritis    . Lung disease    .  Asthma     History  Substance Use Topics  . Smoking status: Never Smoker   . Smokeless tobacco: Never Used  . Alcohol Use: No   Lives with daughter   OB History    No data available     Review of Systems  All other systems reviewed and are negative.     Allergies  Amitriptyline hcl and Sulfonamide derivatives  Home Medications   Prior to Admission medications   Medication Sig Start Date End Date Taking? Authorizing Provider  ALPRAZolam Duanne Moron) 1 MG tablet Take by mouth at bedtime. 1/2 tab at night    Historical Provider, MD  amiodarone (PACERONE) 100 MG tablet Take 1 tablet (100 mg total) by mouth daily. Patient taking differently: Take 200 mg by mouth daily.  05/17/14   Satira Sark, MD  clopidogrel (PLAVIX) 75 MG tablet Take 75 mg by mouth.     Historical Provider, MD  Cyanocobalamin (B-12 PO) Take 1 tablet by mouth daily as needed (for supplement).    Historical Provider, MD  esomeprazole (NEXIUM) 40 MG capsule Take 1 po BID x 2 weeks then once a day 03/05/15   Rolland Porter, MD  furosemide (LASIX) 40 MG tablet Take 40-60 mg by mouth daily as needed (for swelling).  12/04/11   Satira Sark, MD  metoprolol (TOPROL-XL) 50 MG 24 hr tablet Take 75 mg by mouth every morning.     Historical Provider, MD  nitroGLYCERIN (NITROSTAT) 0.4 MG SL tablet Place 0.4 mg under the tongue every 5 (five) minutes as needed for chest pain.     Historical Provider, MD  potassium chloride SA (K-DUR,KLOR-CON) 20 MEQ tablet Take 20-40 mEq by mouth daily as needed (Takes 31mEq daily, but increases to 59mEq as needed for cramping.).     Rexene Alberts, MD  primidone (MYSOLINE) 50 MG tablet Take 50 mg by mouth 2 (two) times daily as needed (for tremors).  10/31/10   Historical Provider, MD  thiamine (VITAMIN B-1) 100 MG tablet Take 100 mg by mouth daily.    Historical Provider, MD  valsartan (DIOVAN) 40 MG tablet Take 40 mg by mouth as needed.     Historical Provider, MD  VITAMIN D, CHOLECALCIFEROL,  PO Take 1 capsule by mouth daily.     Historical Provider, MD  vitamin E 400 UNIT capsule Take 400 Units by mouth daily.    Historical Provider, MD   BP 145/74 mmHg  Pulse 75  Temp(Src) 97.6 F (36.4 C) (Oral)  Resp 18  Ht 5\' 6"  (1.676 m)  Wt 162 lb (73.483 kg)  BMI 26.16 kg/m2  SpO2 99%  Vital signs normal   Physical Exam  Constitutional: She is oriented to person, place, and time. She appears well-developed and well-nourished.  Non-toxic appearance. She does not appear ill. No distress.  HENT:  Head: Normocephalic  and atraumatic.  Right Ear: External ear normal.  Left Ear: External ear normal.  Nose: Nose normal. No mucosal edema or rhinorrhea.  Mouth/Throat: Oropharynx is clear and moist and mucous membranes are normal. No dental abscesses or uvula swelling.  Hard of hearing  Eyes: Conjunctivae and EOM are normal. Pupils are equal, round, and reactive to light.  Neck: Normal range of motion and full passive range of motion without pain. Neck supple.  Cardiovascular: Normal rate, regular rhythm and normal heart sounds.  Exam reveals no gallop and no friction rub.   No murmur heard. Pulmonary/Chest: Effort normal and breath sounds normal. No respiratory distress. She has no wheezes. She has no rhonchi. She has no rales. She exhibits no tenderness and no crepitus.  Abdominal: Soft. Normal appearance and bowel sounds are normal. She exhibits no distension. There is tenderness. There is no rebound and no guarding.  She has mild tenderness of the upper abdomen  Musculoskeletal: Normal range of motion. She exhibits no edema or tenderness.  Moves all extremities well.   Neurological: She is alert and oriented to person, place, and time. She has normal strength. No cranial nerve deficit.  Has resting tremor  Skin: Skin is warm, dry and intact. No rash noted. No erythema. No pallor.  Psychiatric: She has a normal mood and affect. Her speech is normal and behavior is normal. Her mood  appears not anxious.  Nursing note and vitals reviewed.   ED Course  Procedures (including critical care time) Medications  gi cocktail (Maalox,Lidocaine,Donnatal) (30 mLs Oral Given 03/05/15 0250)  aspirin chewable tablet 324 mg (324 mg Oral Given 03/05/15 0249)  nitroGLYCERIN (NITROGLYN) 2 % ointment 1 inch (1 inch Topical Given 03/05/15 0249)  famotidine (PEPCID) tablet 20 mg (20 mg Oral Given 03/05/15 0400)   Pt given ASA, NTG past and a GI cocktail while waiting for her lab tests.  Recheck 03:45 pt states her heartburn is better, still present. Pt laying flat in stretcher. Will add oral pepcid.  Recheck 4:40 pt continues to lay flat in bed despite her nurse elevating the head of her stretcher, still has some mild burning in her throat but she states she feels good enough to go home.    Sep 26, 2013 Cardiac Catheretization  Left Ventriculography:  EF: 55-60 %  Wall Motion: Overall normal wall motion with a somewhat globular appearing apex  Coronary Anatomy:  Dominance: Right  Left Main: Normal caliber vessel with moderate calcification. It bifurcates into the LAD And Circumflex. No significant stenoses. LAD: Normal caliber vessel with proximal roughly 30% calcified stenosis. There is also a focal 40% stenosis at the takeoff of D2. Distally there is 50% stenosis. There are 4 diagonal branches with the first to be moderate caliber second tubing of the small caliber. Minimal disease noted. The distal LAD does not reach all the way to the apex.  Left Circumflex: Normal caliber, nondominant vessel. It gives off a OM1 from the mid vessel. After the OM1 there is a tubular moderate 30-40% stenosis before the vessel bifurcates into a small AV groove branch and OM 2.  OM1: Small-moderate caliber vessel with diffuse 50% stenoses. No significant change from prior catheterization.  OM 2: Small- moderate caliber vessel that bifurcates distally into 2 small caliber branches. Angiographically  normal.   RCA: Large-caliber, dominant vessel with 2 patent stents in the proximal segment. There is diffuse tubular 40% stenosis just prior to the crux. The vessel then normalizes distally as it bifurcates into  the Right Posterior AV Groove Branch (RPAV) and the Right Posterior Descending Artery (RPDA).  RPDA: Moderate-large-caliber vessel that is extensive and reaches to the apex and around and to the anteroapex. The vessel is somewhat tortuous but no significant disease.  RPL Sysytem:The RPAV begins as a moderate large-caliber vessel that terminates as one major moderate caliber posterolateral branch with several smaller branches.   POST-OPERATIVE DIAGNOSIS:   Angiographically moderate residual disease with widely patent stents in the RCA. The OM1 stenosis appears to be somewhat improved as compared to prior catheterization.  No culprit lesion found to explain the patient's symptoms or potential ischemia in the peri-infarct distribution.  Normal well-preserved EF with normal LVEDP.  PLAN OF CARE:  Standard post radial cath care  Continue medical management for potential microvascular disease versus hypertension related endocardial ischemia with diastolic dysfunction.  Follow-up with either Dr. Domenic Polite or Vilinda Flake, NP  Leonie Man, M.D., M.S. Interventional Cardiologist   Labs Review Results for orders placed or performed during the hospital encounter of 03/05/15  Comprehensive metabolic panel  Result Value Ref Range   Sodium 140 135 - 145 mmol/L   Potassium 4.1 3.5 - 5.1 mmol/L   Chloride 101 101 - 111 mmol/L   CO2 30 22 - 32 mmol/L   Glucose, Bld 105 (H) 65 - 99 mg/dL   BUN 12 6 - 20 mg/dL   Creatinine, Ser 0.79 0.44 - 1.00 mg/dL   Calcium 9.1 8.9 - 10.3 mg/dL   Total Protein 7.9 6.5 - 8.1 g/dL   Albumin 3.8 3.5 - 5.0 g/dL   AST 23 15 - 41 U/L   ALT 15 14 - 54 U/L   Alkaline Phosphatase 76 38 - 126 U/L   Total Bilirubin 0.3 0.3 - 1.2 mg/dL   GFR  calc non Af Amer >60 >60 mL/min   GFR calc Af Amer >60 >60 mL/min   Anion gap 9 5 - 15  CBC with Differential  Result Value Ref Range   WBC 6.9 4.0 - 10.5 K/uL   RBC 4.37 3.87 - 5.11 MIL/uL   Hemoglobin 11.0 (L) 12.0 - 15.0 g/dL   HCT 36.2 36.0 - 46.0 %   MCV 82.8 78.0 - 100.0 fL   MCH 25.2 (L) 26.0 - 34.0 pg   MCHC 30.4 30.0 - 36.0 g/dL   RDW 20.2 (H) 11.5 - 15.5 %   Platelets 208 150 - 400 K/uL   Neutrophils Relative % 64 43 - 77 %   Neutro Abs 4.4 1.7 - 7.7 K/uL   Lymphocytes Relative 24 12 - 46 %   Lymphs Abs 1.6 0.7 - 4.0 K/uL   Monocytes Relative 11 3 - 12 %   Monocytes Absolute 0.8 0.1 - 1.0 K/uL   Eosinophils Relative 1 0 - 5 %   Eosinophils Absolute 0.1 0.0 - 0.7 K/uL   Basophils Relative 0 0 - 1 %   Basophils Absolute 0.0 0.0 - 0.1 K/uL  Troponin I  Result Value Ref Range   Troponin I <0.03 <0.031 ng/mL   Laboratory interpretation all normal except mild anemia     Imaging Review Dg Chest Port 1 View  03/05/2015   CLINICAL DATA:  Initial evaluation for acute chest pain.  EXAM: PORTABLE CHEST - 1 VIEW  COMPARISON:  Prior radiograph from 08/23/2014  FINDINGS: Cardiomegaly is stable from previous. Left-sided transvenous pacemaker/ AICD with electrodes overlying the right atrium right ventricle again noted, unchanged. Mediastinal silhouette is stable. Right were bowing of  the tracheal air column at the aortic knob noted.  Lungs are normally inflated. No focal infiltrate identified. Mild perihilar vascular congestion without overt edema. No pleural effusion. No pneumothorax.  No acute osseous abnormality.  IMPRESSION: 1. Stable cardiomegaly with mild central perihilar vascular congestion without overt pulmonary edema. 2. No other active cardiopulmonary disease.   Electronically Signed   By: Jeannine Boga M.D.   On: 03/05/2015 03:17   Dg Abd Portable 1v  03/05/2015   CLINICAL DATA:  Abdominal pain and diaphoresis.  EXAM: PORTABLE ABDOMEN - 1 VIEW  COMPARISON:  CT  02/03/2012  FINDINGS: The abdominal CT for age. There is a generous volume of stool throughout the colon to the rectum. There is a calcification lateral of the right L5 transverse process which is indeterminate ; it could reside within the right ureter. The pelvic calcifications appear to be phleboliths.  IMPRESSION: Negative for obstruction or perforation. Indeterminate calcification lateral to the right L5 transverse process; cannot exclude a right ureteral calculus.   Electronically Signed   By: Andreas Newport M.D.   On: 03/05/2015 03:10     EKG Interpretation   Date/Time:  Monday March 05 2015 02:24:01 EDT Ventricular Rate:  64 PR Interval:  124 QRS Duration: 92 QT Interval:  472 QTC Calculation: 487 R Axis:   27 Text Interpretation:  Sinus rhythm Low voltage, precordial leads Abnormal  R-wave progression, early transition Borderline prolonged QT interval  Artifact from tremor Confirmed by Danzell Birky  MD-I, Giovoni Bunch ([id number]) on 03/05/2015  2:56:53 AM      MDM   Final diagnoses:  Gastroesophageal reflux disease with esophagitis  Constipation, unspecified constipation type    Modified Medications   Modified Medication Previous Medication   ESOMEPRAZOLE (NEXIUM) 40 MG CAPSULE esomeprazole (NEXIUM) 40 MG capsule      Take 1 po BID x 2 weeks then once a day    Take 40 mg by mouth daily.   miralax  Plan discharge  Rolland Porter, MD, Barbette Or, MD 03/05/15 [phone]

## 2015-03-05 NOTE — ED Notes (Signed)
EKG was done at 0225 and given to EDP.

## 2015-03-05 NOTE — Discharge Instructions (Signed)
Increase your nexium to twice a day for the next 2 weeks then back down to once a day. Avoid fried, spicy or greasy foods. Look at the GERD diet information. For your constipation, get miralax and put one dose or 17 g in 8 ounces of water,  take 1 dose every 30 minutes for 2-3 hours or until you  get good results and then once or twice daily to prevent constipation.    Food Choices for Gastroesophageal Reflux Disease When you have gastroesophageal reflux disease (GERD), the foods you eat and your eating habits are very important. Choosing the right foods can help ease the discomfort of GERD. WHAT GENERAL GUIDELINES DO I NEED TO FOLLOW?  Choose fruits, vegetables, whole grains, low-fat dairy products, and low-fat meat, fish, and poultry.  Limit fats such as oils, salad dressings, butter, nuts, and avocado.  Keep a food diary to identify foods that cause symptoms.  Avoid foods that cause reflux. These may be different for different people.  Eat frequent small meals instead of three large meals each day.  Eat your meals slowly, in a relaxed setting.  Limit fried foods.  Cook foods using methods other than frying.  Avoid drinking alcohol.  Avoid drinking large amounts of liquids with your meals.  Avoid bending over or lying down until 2-3 hours after eating. WHAT FOODS ARE NOT RECOMMENDED? The following are some foods and drinks that may worsen your symptoms: Vegetables Tomatoes. Tomato juice. Tomato and spaghetti sauce. Chili peppers. Onion and garlic. Horseradish. Fruits Oranges, grapefruit, and lemon (fruit and juice). Meats High-fat meats, fish, and poultry. This includes hot dogs, ribs, ham, sausage, salami, and bacon. Dairy Whole milk and chocolate milk. Sour cream. Cream. Butter. Ice cream. Cream cheese.  Beverages Coffee and tea, with or without caffeine. Carbonated beverages or energy drinks. Condiments Hot sauce. Barbecue sauce.  Sweets/Desserts Chocolate and cocoa.  Donuts. Peppermint and spearmint. Fats and Oils High-fat foods, including [place] fries and potato chips. Other Vinegar. Strong spices, such as black pepper, white pepper, red pepper, cayenne, curry powder, cloves, ginger, and chili powder. The items listed above may not be a complete list of foods and beverages to avoid. Contact your dietitian for more information. Document Released: 07/21/2005 Document Revised: 07/26/2013 Document Reviewed: 05/25/2013 [facility] Patient Information 2015 [place], [place]. This information is not intended to replace advice given to you by your health care provider. Make sure you discuss any questions you have with your health care provider.  Constipation Constipation is when a person:  Poops (has a bowel movement) less than 3 times a week.  Has a hard time pooping.  Has poop that is dry, hard, or bigger than normal. HOME CARE   Eat foods with a lot of fiber in them. This includes fruits, vegetables, beans, and whole grains such as brown rice.  Avoid fatty foods and foods with a lot of sugar. This includes french fries, hamburgers, cookies, candy, and soda.  If you are not getting enough fiber from food, take products with added fiber in them (supplements).  Drink enough fluid to keep your pee (urine) clear or pale yellow.  Exercise on a regular basis, or as told by your doctor.  Go to the restroom when you feel like you need to poop. Do not hold it.  Only take medicine as told by your doctor. Do not take medicines that help you poop (laxatives) without talking to your doctor first. GET HELP RIGHT AWAY IF:   You have bright  red blood in your poop (stool).  Your constipation lasts more than 4 days or gets worse.  You have belly (abdominal) or butt (rectal) pain.  You have thin poop (as thin as a pencil).  You lose weight, and it cannot be explained. MAKE SURE YOU:   Understand these instructions.  Will watch your condition.  Will get help  right away if you are not doing well or get worse. Document Released: 01/07/2008 Document Revised: 07/26/2013 Document Reviewed: 05/02/2013 [facility] Patient Information 2015 [place], [place]. This information is not intended to replace advice given to you by your health care provider. Make sure you discuss any questions you have with your health care provider.

## 2015-03-06 ENCOUNTER — Telehealth (INDEPENDENT_AMBULATORY_CARE_PROVIDER_SITE_OTHER): Payer: Self-pay | Admitting: *Deleted

## 2015-03-06 ENCOUNTER — Other Ambulatory Visit (INDEPENDENT_AMBULATORY_CARE_PROVIDER_SITE_OTHER): Payer: Self-pay | Admitting: *Deleted

## 2015-03-06 MED ORDER — HYDROCORTISONE ACETATE 25 MG RE SUPP: 25.0000 mg | Freq: Every day | RECTAL | Status: DC

## 2015-03-06 NOTE — Telephone Encounter (Signed)
Per Dr.Rehman the patient may try the Anusol HC Suppositories 1 per rectal at bedtime for 2 weeks.

## 2015-03-06 NOTE — Telephone Encounter (Signed)
Delainy said she is passing right much blood. The return phone number is [phone].

## 2015-03-06 NOTE — Telephone Encounter (Signed)
Patient states that she has been very constipated. She started taking Miralax. She noted a little BM , but when she wiped she saw bright red blood, she feels that it is hemorrhoid related. [name] feels that she has to go to the bathroom,but she is unable to pass stool,only blood.  She was seen in the ED on 03/05/15 and she was told that she had unspecified Constipation.  Per Dr.Rehman the patient may try - Anusol HC Suppositories  1 per rectum at bedtime for 2 weeks. Patient was called and made aware. Prescription was e-scribed to pharmacy.

## 2015-03-06 NOTE — Telephone Encounter (Signed)
Patient was called but her telephone line was busy.

## 2015-03-10 DIAGNOSIS — I509 Heart failure, unspecified: Secondary | ICD-10-CM | POA: Diagnosis not present

## 2015-03-22 DIAGNOSIS — M545 Low back pain: Secondary | ICD-10-CM | POA: Diagnosis not present

## 2015-03-22 DIAGNOSIS — I10 Essential (primary) hypertension: Secondary | ICD-10-CM | POA: Diagnosis not present

## 2015-03-22 DIAGNOSIS — R05 Cough: Secondary | ICD-10-CM | POA: Diagnosis not present

## 2015-03-22 DIAGNOSIS — I2510 Atherosclerotic heart disease of native coronary artery without angina pectoris: Secondary | ICD-10-CM | POA: Diagnosis not present

## 2015-03-22 DIAGNOSIS — F419 Anxiety disorder, unspecified: Secondary | ICD-10-CM | POA: Diagnosis not present

## 2015-03-28 DIAGNOSIS — E782 Mixed hyperlipidemia: Secondary | ICD-10-CM | POA: Diagnosis not present

## 2015-03-28 DIAGNOSIS — I10 Essential (primary) hypertension: Secondary | ICD-10-CM | POA: Diagnosis not present

## 2015-03-29 ENCOUNTER — Encounter: Payer: Self-pay | Admitting: Internal Medicine

## 2015-03-29 ENCOUNTER — Ambulatory Visit (INDEPENDENT_AMBULATORY_CARE_PROVIDER_SITE_OTHER): Payer: Medicare Other | Admitting: Internal Medicine

## 2015-03-29 VITALS — BP 138/78 | HR 68 | Ht 64.0 in | Wt 161.0 lb

## 2015-03-29 DIAGNOSIS — Z950 Presence of cardiac pacemaker: Secondary | ICD-10-CM

## 2015-03-29 DIAGNOSIS — I4891 Unspecified atrial fibrillation: Secondary | ICD-10-CM

## 2015-03-29 DIAGNOSIS — I10 Essential (primary) hypertension: Secondary | ICD-10-CM | POA: Diagnosis not present

## 2015-03-29 LAB — CUP PACEART INCLINIC DEVICE CHECK
Battery Impedance: 444 Ohm
Battery Voltage: 2.79 V
Brady Statistic AP VP Percent: 0 %
Brady Statistic AP VS Percent: 84 %
Brady Statistic AS VS Percent: 16 %
Lead Channel Impedance Value: 430 Ohm
Lead Channel Impedance Value: 500 Ohm
Lead Channel Pacing Threshold Amplitude: 0.5 V
Lead Channel Pacing Threshold Pulse Width: 0.4 ms
Lead Channel Sensing Intrinsic Amplitude: 1.4 mV
Lead Channel Sensing Intrinsic Amplitude: 8 mV
Lead Channel Setting Pacing Amplitude: 2 V
Lead Channel Setting Pacing Pulse Width: 0.4 ms
Lead Channel Setting Sensing Sensitivity: 2.8 mV
MDC IDC MSMT BATTERY REMAINING LONGEVITY: 93 mo
MDC IDC MSMT LEADCHNL RA PACING THRESHOLD PULSEWIDTH: 0.4 ms
MDC IDC MSMT LEADCHNL RV PACING THRESHOLD AMPLITUDE: 0.75 V
MDC IDC SESS DTM: 20160825132029
MDC IDC SET LEADCHNL RV PACING AMPLITUDE: 2.5 V
MDC IDC STAT BRADY AS VP PERCENT: 0 %

## 2015-03-29 NOTE — Assessment & Plan Note (Signed)
Her Medtronic DDD PM is working normally. Will recheck in several months. 

## 2015-03-29 NOTE — Patient Instructions (Signed)
Your physician wants you to follow-up in: 12 months with Dr. Lovena Le. You will receive a reminder letter in the mail two months in advance. If you don't receive a letter, please call our office to schedule the follow-up appointment.  Your physician recommends that you schedule a follow-up appointment in 6 months with the [facility].  Your physician recommends that you continue on your current medications as directed. Please refer to the Current Medication list given to you today.  Thank you for choosing [facility]!

## 2015-03-29 NOTE — Assessment & Plan Note (Signed)
PM interogation demonstrates no atrial fib since her last check. She will continue her current meds.

## 2015-03-29 NOTE — Assessment & Plan Note (Signed)
Her blood pressure is still a bit elevated. She admits to dietary indiscretion with sodium. I have asked her to continue her current meds and reduce her salt intake.

## 2015-03-29 NOTE — Progress Notes (Signed)
HPI [name] returns today for followup. She is a 79 year old woman with a history of tachybrady syndrome, coronary disease, status post permanent pacemaker insertion. The patient is not thought to be a Coumadin candidate, particularly in light of her need for aspirin and Plavix. In the interim, she denies chest pain. She has minimal palpitation. No shortness of breath. No syncope.   Allergies  Allergen Reactions  . Amitriptyline Hcl Other (See Comments)    Caused "jaws to twist and lock"  . Sulfonamide Derivatives Other (See Comments)    UNKNOWN REACTION     Current Outpatient Prescriptions  Medication Sig Dispense Refill  . ALPRAZolam (XANAX) 1 MG tablet Take by mouth at bedtime. 1/2 tab at night    . amiodarone (PACERONE) 100 MG tablet Take 100 mg by mouth daily.    . clopidogrel (PLAVIX) 75 MG tablet Take 75 mg by mouth.     . Cyanocobalamin (B-12 PO) Take 1 tablet by mouth daily as needed (for supplement).    Marland Kitchen esomeprazole (NEXIUM) 40 MG capsule Take 1 po BID x 2 weeks then once a day 60 capsule 0  . furosemide (LASIX) 40 MG tablet Take 40-60 mg by mouth daily as needed (for swelling).     . hydrocortisone (ANUSOL-HC) 25 MG suppository Place 1 suppository (25 mg total) rectally at bedtime. 14 suppository 1  . metoprolol (TOPROL-XL) 50 MG 24 hr tablet Take 75 mg by mouth every morning.     . nitroGLYCERIN (NITROSTAT) 0.4 MG SL tablet Place 0.4 mg under the tongue every 5 (five) minutes as needed for chest pain.     . potassium chloride SA (K-DUR,KLOR-CON) 20 MEQ tablet Take 20-40 mEq by mouth daily as needed (Takes 50mEq daily, but increases to 2mEq as needed for cramping.).     Marland Kitchen primidone (MYSOLINE) 50 MG tablet Take 50 mg by mouth 2 (two) times daily as needed (for tremors).     . thiamine (VITAMIN B-1) 100 MG tablet Take 100 mg by mouth daily.    . valsartan (DIOVAN) 40 MG tablet Take 40 mg by mouth as needed.     Marland Kitchen VITAMIN D, CHOLECALCIFEROL, PO Take 1 capsule by mouth daily.      . vitamin E 400 UNIT capsule Take 400 Units by mouth daily.     No current facility-administered medications for this visit.     Past Medical History  Diagnosis Date  . Atrial fibrillation   . Coronary atherosclerosis of native coronary artery     DES x 2 to RCA 10/10  . GERD (gastroesophageal reflux disease)   . Tachycardia-bradycardia syndrome     s/p Medtronic Adapta L dual chamber device  5/10  . Hyperlipidemia   . COPD (chronic obstructive pulmonary disease)   . Anxiety   . Resting tremor   . 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 Chronic back pain   . Pericardial effusion     Hemorrhagic   . Essential hypertension, benign   . Headache(784.0)   . H/O hiatal hernia   . Neuromuscular disorder     Tremors    ROS:   All systems reviewed and negative except as noted in the HPI.   Past Surgical History  Procedure Laterality Date  . Cholecystectomy    . Appendectomy    . Subxiphoid pericardial window  11/10  . Esophagogastroduodenoscopy with esophageal dilation  2004, 2006, 2007  . Vaginal hysterectomy    . Colonoscopy  2011  .  Right rotator cuff repair    . Insert / replace / remove pacemaker    . Left heart catheterization with coronary angiogram N/A 11/17/2011    Procedure: LEFT HEART CATHETERIZATION WITH CORONARY ANGIOGRAM;  Surgeon: Sherren Mocha, MD;  Location: [facility] CATH LAB;  Service: Cardiovascular;  Laterality: N/A;  . Left heart catheterization with coronary angiogram N/A 09/26/2014    Procedure: LEFT HEART CATHETERIZATION WITH CORONARY ANGIOGRAM;  Surgeon: Leonie Man, MD;  Location: [facility] CATH LAB;  Service: Cardiovascular;  Laterality: N/A;  . Colonoscopy N/A 10/19/2014    Procedure: COLONOSCOPY;  Surgeon: Rogene Houston, MD;  Location: [facility] ENDO SUITE;  Service: Endoscopy;  Laterality: N/A;  1030  . Esophagogastroduodenoscopy N/A 10/26/2014    Procedure: ESOPHAGOGASTRODUODENOSCOPY (EGD);  Surgeon: Rogene Houston, MD;  Location: [facility] ENDO  SUITE;  Service: Endoscopy;  Laterality: N/A;  230 - Dr. has lunch and learn  . Givens capsule study N/A 10/31/2014    Procedure: GIVENS CAPSULE STUDY;  Surgeon: Rogene Houston, MD;  Location: [facility] END[facility] SU[facility];  Service: Endoscopy;  Laterality: N/A;  730 -- pacemaker--needs monitoring--outpatient bed     Family History  Problem Relation Age of Onset  . Cancer Mother     Colon   . Coronary artery disease Sister   . Coronary artery disease Brother   . Arthritis    . Lung disease    . Asthma       Social History   Social History  . Marital Status: Widowed    Spouse Name: N/A  . Number of Children: N/A  . Years of Education: N/A   Occupational History  . Retired    Social History Main Topics  . Smoking status: Never Smoker   . Smokeless tobacco: Never Used  . Alcohol Use: No  . Drug Use: No  . Sexual Activity: Not on file   Other Topics Concern  . Not on file   Social History Narrative   She lives alone, has adopted daughter.     BP 138/78 mmHg  Pulse 68  Ht 5\' 4"  (1.626 m)  Wt 161 lb (73.029 kg)  BMI 27.62 kg/m2  SpO2 96%  Physical Exam:  Well appearing 79 year old woman, NAD HEENT: Unremarkable Neck:  6 cm JVD, no thyromegally Lungs:  Clear with no wheezes, rales, or rhonchi. Well-healed pacemaker incision. HEART:  Regular rate rhythm, no murmurs, no rubs, no clicks Abd:  soft, positive bowel sounds, no organomegally, no rebound, no guarding Ext:  2 plus pulses, no edema, no cyanosis, no clubbing Skin:  No rashes no nodules Neuro:  CN II through XII intact, motor grossly intact  DEVICE  Normal device function.  See PaceArt for details.   Assess/Plan:

## 2015-04-02 ENCOUNTER — Encounter: Payer: Self-pay | Admitting: Cardiovascular Disease

## 2015-04-04 DIAGNOSIS — D509 Iron deficiency anemia, unspecified: Secondary | ICD-10-CM | POA: Diagnosis not present

## 2015-04-04 DIAGNOSIS — Z23 Encounter for immunization: Secondary | ICD-10-CM | POA: Diagnosis not present

## 2015-04-04 DIAGNOSIS — E782 Mixed hyperlipidemia: Secondary | ICD-10-CM | POA: Diagnosis not present

## 2015-04-04 DIAGNOSIS — M545 Low back pain: Secondary | ICD-10-CM | POA: Diagnosis not present

## 2015-04-10 DIAGNOSIS — I509 Heart failure, unspecified: Secondary | ICD-10-CM | POA: Diagnosis not present

## 2015-04-18 ENCOUNTER — Encounter: Payer: Self-pay | Admitting: Internal Medicine

## 2015-04-24 ENCOUNTER — Ambulatory Visit (INDEPENDENT_AMBULATORY_CARE_PROVIDER_SITE_OTHER): Payer: Medicare Other

## 2015-04-24 ENCOUNTER — Ambulatory Visit (INDEPENDENT_AMBULATORY_CARE_PROVIDER_SITE_OTHER): Payer: Medicare Other | Admitting: Orthopedic Surgery

## 2015-04-24 VITALS — BP 156/89 | Ht 64.0 in | Wt 161.0 lb

## 2015-04-24 DIAGNOSIS — M75101 Unspecified rotator cuff tear or rupture of right shoulder, not specified as traumatic: Secondary | ICD-10-CM

## 2015-04-24 DIAGNOSIS — M25511 Pain in right shoulder: Secondary | ICD-10-CM

## 2015-04-24 NOTE — Progress Notes (Signed)
Patient ID: [name], female   DOB: Aug 11, 1935, 79 y.o.   MRN: [id number]  New problem   Chief Complaint  Patient presents with  . Shoulder Pain    right shoulder pain, referred by Z HALL    HPI [name] is a 79 y.o. female.   HPI  Micaella had a successful rotator cuff repair and now presents with pain after she fell on her right shoulder last year. After the fall she knows that she could move her arm as well especially with overhead activity and she had increasing shoulder pain for one year. Mild weakness as well and peri-acromial pain no neck pain.  Review of systems denies numbness tingling fever chills or swelling   Review of Systems Review of Systems   Past Medical History  Diagnosis Date  . Atrial fibrillation   . Coronary atherosclerosis of native coronary artery     DES x 2 to RCA 10/10  . GERD (gastroesophageal reflux disease)   . Tachycardia-bradycardia syndrome     s/p Medtronic Adapta L dual chamber device  5/10  . Hyperlipidemia   . COPD (chronic obstructive pulmonary disease)   . Anxiety   . Resting tremor   . 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 Chronic back pain   . Pericardial effusion     Hemorrhagic   . Essential hypertension, benign   . Headache(784.0)   . H/O hiatal hernia   . Neuromuscular disorder     Tremors    Past Surgical History  Procedure Laterality Date  . Cholecystectomy    . Appendectomy    . Subxiphoid pericardial window  11/10  . Esophagogastroduodenoscopy with esophageal dilation  2004, 2006, 2007  . Vaginal hysterectomy    . Colonoscopy  2011  . Right rotator cuff repair    . Insert / replace / remove pacemaker    . Left heart catheterization with coronary angiogram N/A 11/17/2011    Procedure: LEFT HEART CATHETERIZATION WITH CORONARY ANGIOGRAM;  Surgeon: Sherren Mocha, MD;  Location: [facility] CATH LAB;  Service: Cardiovascular;  Laterality: N/A;  . Left heart catheterization with coronary angiogram N/A  09/26/2014    Procedure: LEFT HEART CATHETERIZATION WITH CORONARY ANGIOGRAM;  Surgeon: Leonie Man, MD;  Location: [facility] CATH LAB;  Service: Cardiovascular;  Laterality: N/A;  . Colonoscopy N/A 10/19/2014    Procedure: COLONOSCOPY;  Surgeon: Rogene Houston, MD;  Location: [facility];  Service: Endoscopy;  Laterality: N/A;  1030  . Esophagogastroduodenoscopy N/A 10/26/2014    Procedure: ESOPHAGOGASTRODUODENOSCOPY (EGD);  Surgeon: Rogene Houston, MD;  Location: [facility];  Service: Endoscopy;  Laterality: N/A;  230 - Dr. has lunch and learn  . Givens capsule study N/A 10/31/2014    Procedure: GIVENS CAPSULE STUDY;  Surgeon: Rogene Houston, MD;  Location: [facility];  Service: Endoscopy;  Laterality: N/A;  730 -- pacemaker--needs monitoring--outpatient bed    Family History  Problem Relation Age of Onset  . Cancer Mother     Colon   . Coronary artery disease Sister   . Coronary artery disease Brother   . Arthritis    . Lung disease    . Asthma      Social History Social History  Substance Use Topics  . Smoking status: Never Smoker   . Smokeless tobacco: Never Used  . Alcohol Use: No    Allergies  Allergen Reactions  . Amitriptyline Hcl Other (See Comments)  Caused "jaws to twist and lock"  . Sulfonamide Derivatives Other (See Comments)    UNKNOWN REACTION    Current Outpatient Prescriptions  Medication Sig Dispense Refill  . ALPRAZolam (XANAX) 1 MG tablet Take by mouth at bedtime. 1/2 tab at night    . amiodarone (PACERONE) 100 MG tablet Take 100 mg by mouth daily.    Marland Kitchen aspirin 81 MG tablet Take 81 mg by mouth daily.    . clopidogrel (PLAVIX) 75 MG tablet Take 75 mg by mouth.     . Cyanocobalamin (B-12 PO) Take 1 tablet by mouth daily as needed (for supplement).    Marland Kitchen esomeprazole (NEXIUM) 40 MG capsule Take 1 po BID x 2 weeks then once a day 60 capsule 0  . furosemide (LASIX) 40 MG tablet Take 40-60 mg by mouth daily as needed (for swelling).     .  hydrocortisone (ANUSOL-HC) 25 MG suppository Place 1 suppository (25 mg total) rectally at bedtime. 14 suppository 1  . isosorbide mononitrate (IMDUR) 30 MG 24 hr tablet Take 30 mg by mouth daily.    . metoprolol (TOPROL-XL) 50 MG 24 hr tablet Take 75 mg by mouth every morning.     . nitroGLYCERIN (NITROSTAT) 0.4 MG SL tablet Place 0.4 mg under the tongue every 5 (five) minutes as needed for chest pain.     . potassium chloride SA (K-DUR,KLOR-CON) 20 MEQ tablet Take 20-40 mEq by mouth daily as needed (Takes 43mEq daily, but increases to 50mEq as needed for cramping.).     Marland Kitchen primidone (MYSOLINE) 50 MG tablet Take 50 mg by mouth 2 (two) times daily as needed (for tremors).     . simvastatin (ZOCOR) 5 MG tablet Take 5 mg by mouth daily.    Marland Kitchen thiamine (VITAMIN B-1) 100 MG tablet Take 100 mg by mouth daily.    . valsartan (DIOVAN) 40 MG tablet Take 40 mg by mouth as needed.     Marland Kitchen VITAMIN D, CHOLECALCIFEROL, PO Take 1 capsule by mouth daily.     . vitamin E 400 UNIT capsule Take 400 Units by mouth daily.     No current facility-administered medications for this visit.       Physical Exam Blood pressure 156/89, height 5\' 4"  (1.626 m), weight 161 lb (73.029 kg). Physical Exam The patient is well developed well nourished and well groomed. Orientation to person place and time is normal  Mood is pleasant. Ambulatory status  no assistive devices needed Cervical spine exam is as follows  decreased range of motion with crepitance mild tenderness lateral or right side of the cervical spine   right shoulder  Examination: Inspection reveals tenderness around the peri-acromial region. The patient has decreased range of motion and 4/5 motor function of the rotator cuff. Stability in abduction external rotation is normal. Neurovascular examination is intact and the lymph nodes in the axilla and supraclavicular regions are normal   The opposite shoulder exhibits normal range of motion stability and strength  neurovascular exam is intact lymph nodes are negative and there is no swelling or tenderness   Data Reviewed  independent image interpretation :   She has mild degenerative changes in the glenohumeral area  Assessment    Rotator cuff tear versus impingement syndrome    Plan    Subacromial injection, exercises at home using the Codman technique follow-up 3 weeks no improvement MRI shoulder

## 2015-04-24 NOTE — Patient Instructions (Signed)
Joint Injection  Care After  Refer to this sheet in the next few days. These instructions provide you with information on caring for yourself after you have had a joint injection. Your caregiver also may give you more specific instructions. Your treatment has been planned according to current medical practices, but problems sometimes occur. Call your caregiver if you have any problems or questions after your procedure.  After any type of joint injection, it is not uncommon to experience:  · Soreness, swelling, or bruising around the injection site.  · Mild numbness, tingling, or weakness around the injection site caused by the numbing medicine used before or with the injection.  It also is possible to experience the following effects associated with the specific agent after injection:  · Iodine-based contrast agents:  ¨ Allergic reaction (itching, hives, widespread redness, and swelling beyond the injection site).  · Corticosteroids (These effects are rare.):  ¨ Allergic reaction.  ¨ Increased blood sugar levels (If you have diabetes and you notice that your blood sugar levels have increased, notify your caregiver).  ¨ Increased blood pressure levels.  ¨ Mood swings.  · Hyaluronic acid in the use of viscosupplementation.  ¨ Temporary heat or redness.  ¨ Temporary rash and itching.  ¨ Increased fluid accumulation in the injected joint.  These effects all should resolve within a day after your procedure.   HOME CARE INSTRUCTIONS  · Limit yourself to light activity the day of your procedure. Avoid lifting heavy objects, bending, stooping, or twisting.  · Take prescription or over-the-counter pain medication as directed by your caregiver.  · You may apply ice to your injection site to reduce pain and swelling the day of your procedure. Ice may be applied 03-04 times:  ¨ Put ice in a plastic bag.  ¨ Place a towel between your skin and the bag.  ¨ Leave the ice on for no longer than 15-20 minutes each time.  SEEK  IMMEDIATE MEDICAL CARE IF:   · Pain and swelling get worse rather than better or extend beyond the injection site.  · Numbness does not go away.  · Blood or fluid continues to leak from the injection site.  · You have chest pain.  · You have swelling of your face or tongue.  · You have trouble breathing or you become dizzy.  · You develop a fever, chills, or severe tenderness at the injection site that last longer than 1 day.  MAKE SURE YOU:  · Understand these instructions.  · Watch your condition.  · Get help right away if you are not doing well or if you get worse.  Document Released: 04/03/2011 Document Revised: 10/13/2011 Document Reviewed: 04/03/2011  [employer]® Patient Information ©2015 [employer]. This information is not intended to replace advice given to you by your health care provider. Make sure you discuss any questions you have with your health care provider.

## 2015-04-25 DIAGNOSIS — Z23 Encounter for immunization: Secondary | ICD-10-CM | POA: Diagnosis not present

## 2015-04-30 ENCOUNTER — Ambulatory Visit (INDEPENDENT_AMBULATORY_CARE_PROVIDER_SITE_OTHER): Payer: Medicare Other | Admitting: Cardiology

## 2015-04-30 ENCOUNTER — Encounter: Payer: Self-pay | Admitting: Cardiology

## 2015-04-30 VITALS — BP 134/86 | HR 66 | Ht 64.0 in | Wt 157.0 lb

## 2015-04-30 DIAGNOSIS — I2510 Atherosclerotic heart disease of native coronary artery without angina pectoris: Secondary | ICD-10-CM | POA: Diagnosis not present

## 2015-04-30 DIAGNOSIS — I480 Paroxysmal atrial fibrillation: Secondary | ICD-10-CM | POA: Diagnosis not present

## 2015-04-30 DIAGNOSIS — Z950 Presence of cardiac pacemaker: Secondary | ICD-10-CM | POA: Diagnosis not present

## 2015-04-30 DIAGNOSIS — I10 Essential (primary) hypertension: Secondary | ICD-10-CM

## 2015-04-30 NOTE — Progress Notes (Signed)
Cardiology Office Note  Date: 04/30/2015   ID: [name], DOB [birth year], MRN [id number]  PCP: Wende Neighbors, MD  Primary Cardiologist: Rozann Lesches, MD   Chief Complaint  Patient presents with  . Coronary Artery Disease  . Atrial Fibrillation    History of Present Illness: [name] is a 79 y.o. female last seen in February by [name]. Recent visit with [name] noted in August for device interrogation. Of note, she did not have any evidence of atrial fibrillation documented on device interrogation since her last check. She presents today for a routine follow-up visit, states that she has been doing well without any significant chest pain or palpitations. She remains functional in her ADLs including house chores, yard work, states that she has been taking her medications regularly.  She does report having trouble with her right rotator cuff, plans to follow-up with [name]. Uncertain at this point, but thinks that she may need to have surgery.  We reviewed her medications which are outlined below. He continues to follow with [name] for primary care and routine lab work.  Earlier this year she underwent a follow-up cardiac catheterization as documented below. She has moderate multivessel CAD with documentation of patent stent sites in the RCA as of February.   Past Medical History  Diagnosis Date  . Atrial fibrillation   . Coronary atherosclerosis of native coronary artery     DES x 2 to RCA 10/10  . GERD (gastroesophageal reflux disease)   . Tachycardia-bradycardia syndrome     s/p Medtronic Adapta L dual chamber device  5/10  . Hyperlipidemia   . COPD (chronic obstructive pulmonary disease)   . Anxiety   . Resting tremor   . 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 Chronic back pain   . Pericardial effusion     Hemorrhagic   . Essential hypertension, benign   . Headache(784.0)   . H/O hiatal hernia   . Neuromuscular disorder    Tremors    Past Surgical History  Procedure Laterality Date  . Cholecystectomy    . Appendectomy    . Subxiphoid pericardial window  11/10  . Esophagogastroduodenoscopy with esophageal dilation  2004, 2006, 2007  . Vaginal hysterectomy    . Colonoscopy  2011  . Right rotator cuff repair    . Insert / replace / remove pacemaker    . Left heart catheterization with coronary angiogram N/A 11/17/2011    Procedure: LEFT HEART CATHETERIZATION WITH CORONARY ANGIOGRAM;  Surgeon: Katrina Mocha, MD;  Location: [facility] CATH LAB;  Service: Cardiovascular;  Laterality: N/A;  . Left heart catheterization with coronary angiogram N/A 09/26/2014    Procedure: LEFT HEART CATHETERIZATION WITH CORONARY ANGIOGRAM;  Surgeon: Katrina Man, MD;  Location: [facility] CATH LAB;  Service: Cardiovascular;  Laterality: N/A;  . Colonoscopy N/A 10/19/2014    Procedure: COLONOSCOPY;  Surgeon: Katrina Houston, MD;  Location: [facility] ENDO SUITE;  Service: Endoscopy;  Laterality: N/A;  1030  . Esophagogastroduodenoscopy N/A 10/26/2014    Procedure: ESOPHAGOGASTRODUODENOSCOPY (EGD);  Surgeon: Katrina Houston, MD;  Location: [facility] ENDO SUITE;  Service: Endoscopy;  Laterality: N/A;  230 - Dr. has lunch and learn  . Givens capsule study N/A 10/31/2014    Procedure: GIVENS CAPSULE STUDY;  Surgeon: Katrina Houston, MD;  Location: [facility] ENDO SUITE;  Service: Endoscopy;  Laterality: N/A;  730 -- pacemaker--needs monitoring--outpatient bed    Current Outpatient Prescriptions  Medication Sig Dispense Refill  . ALPRAZolam (XANAX) 1 MG tablet Take by mouth at bedtime. 1/2 tab at night    . amiodarone (PACERONE) 100 MG tablet Take 100 mg by mouth daily.    [name] aspirin 81 MG tablet Take 81 mg by mouth daily.    . clopidogrel (PLAVIX) 75 MG tablet Take 75 mg by mouth.     . Cyanocobalamin (B-12 PO) Take 1 tablet by mouth daily as needed (for supplement).    [name] esomeprazole (NEXIUM) 40 MG capsule Take 1 po BID x 2 weeks then once a day 60 capsule 0  .  furosemide (LASIX) 40 MG tablet Take 40-60 mg by mouth daily as needed (for swelling).     . metoprolol (TOPROL-XL) 50 MG 24 hr tablet Take 75 mg by mouth every morning.     . nitroGLYCERIN (NITROSTAT) 0.4 MG SL tablet Place 0.4 mg under the tongue every 5 (five) minutes as needed for chest pain.     . potassium chloride SA (K-DUR,KLOR-CON) 20 MEQ tablet Take 20-40 mEq by mouth daily as needed (Takes 56mEq daily, but increases to 73mEq as needed for cramping.).     [name] primidone (MYSOLINE) 50 MG tablet Take 50 mg by mouth 2 (two) times daily as needed (for tremors).     . simvastatin (ZOCOR) 5 MG tablet Take 5 mg by mouth daily.    [name] thiamine (VITAMIN B-1) 100 MG tablet Take 100 mg by mouth daily.    . valsartan (DIOVAN) 40 MG tablet Take 40 mg by mouth as needed.     [name] VITAMIN D, CHOLECALCIFEROL, PO Take 1 capsule by mouth daily.     . vitamin E 400 UNIT capsule Take 400 Units by mouth daily.     No current facility-administered medications for this visit.    Allergies:  Amitriptyline hcl and Sulfonamide derivatives   Social History: The patient  reports that she has never smoked. She has never used smokeless tobacco. She reports that she does not drink alcohol or use illicit drugs.   ROS:  Please see the history of present illness. Otherwise, complete review of systems is positive for arthritic pains, right shoulder musculoskeletal discomfort at times. Chronic tremor. Decreased hearing..  All other systems are reviewed and negative.   Physical Exam: VS:  BP 134/86 mmHg  Pulse 66  Ht 5\' 4"  (1.626 m)  Wt 157 lb (71.215 kg)  BMI 26.94 kg/m2  SpO2 96%, BMI Body mass index is 26.94 kg/(m^2).  Wt Readings from Last 3 Encounters:  04/30/15 157 lb (71.215 kg)  04/24/15 161 lb (73.029 kg)  03/29/15 161 lb (73.029 kg)     Chronically ill-appearing woman, comfortable at rest.  HEENT: Conjunctiva and lids are normal, oropharynx clear.  Neck: Supple, no JVD, no bruits.  Lungs: Clear to  auscultation with diminished breath sounds mainly at bases.  Cardiac: Regular rate and rhythm without S3 gallop. No pericardial rub or knock.  Abdomen: Soft, nontender, bowel sounds present  Extremities: Trace edema.  Skin: Warm and dry.  Musculoskeletal: Kyphosis noted.  Neuropsychiatric: Alert and oriented x3, resting tremor as before.   ECG: Tracing from 03/29/2015 showed an atrial paced rhythm.  Recent Labwork: 03/05/2015: ALT 15; AST 23; BUN 12; Creatinine, Ser 0.79; Hemoglobin 11.0*; Platelets 208; Potassium 4.1; Sodium 140   Other Studies Reviewed Today:  Lexiscan Cardiolite 08/25/2014: FINDINGS: Baseline ECG shows atrial paced rhythm at 60 beats per min. Lexiscan bolus was given in standard fashion. Heart rate  increased from 60 beats per min up to 75 beats per min, and blood pressure increased from 127/76 up to 131/80. Patient tolerated infusion well. There were no diagnostic ST segment abnormalities noted. Rare PVC was seen.  Analysis of the overall perfusion data finds adequate myocardial radiotracer uptake.  Perfusion: There is a small, mild intensity, apical to basal inferolateral defect that exhibits partial reversibility. This is most consistent with scar and mild peri-infarct ischemia.  Wall Motion: Normal left ventricular wall motion. No left ventricular dilation.  Left Ventricular Ejection Fraction: 56 %  End diastolic volume 55 ml  End systolic volume 24 ml  IMPRESSION: 1. Small region of scar with mild peri-infarct ischemia in the inferolateral wall.  2. Normal left ventricular wall motion.  3. Left ventricular ejection fraction 56%  4. Low-risk stress test findings*.  Cardiac catheterization 09/26/2014: FINDINGS:  Hemodynamics:   Central Aortic Pressure / Mean: 112/50/74 mmHg  Left Ventricular Pressure / LVEDP: 111/3/12 mmHg  Left Ventriculography:  EF: 55-60 %  Wall Motion: Overall normal wall motion with a somewhat  globular appearing apex  Coronary Anatomy:  Dominance: Right  Left Main: Normal caliber vessel with moderate calcification. It bifurcates into the LAD And Circumflex. No significant stenoses. LAD: Normal caliber vessel with proximal roughly 30% calcified stenosis. There is also a focal 40% stenosis at the takeoff of D2. Distally there is 50% stenosis. There are 4 diagonal branches with the first to be moderate caliber second tubing of the small caliber. Minimal disease noted. The distal LAD does not reach all the way to the apex.  Left Circumflex: Normal caliber, nondominant vessel. It gives off a OM1 from the mid vessel. After the OM1 there is a tubular moderate 30-40% stenosis before the vessel bifurcates into a small AV groove branch and OM 2.  OM1: Small-moderate caliber vessel with diffuse 50% stenoses. No significant change from prior catheterization.  OM 2: Small- moderate caliber vessel that bifurcates distally into 2 small caliber branches. Angiographically normal.   RCA: Large-caliber, dominant vessel with 2 patent stents in the proximal segment. There is diffuse tubular 40% stenosis just prior to the crux. The vessel then normalizes distally as it bifurcates into the Right Posterior AV Groove Branch (RPAV) and the Right Posterior Descending Artery (RPDA).  RPDA: Moderate-large-caliber vessel that is extensive and reaches to the apex and around and to the anteroapex. The vessel is somewhat tortuous but no significant disease.  RPL Sysytem:The RPAV begins as a moderate large-caliber vessel that terminates as one major moderate caliber posterolateral branch with several smaller branches.   ASSESSMENT AND PLAN:  1. Symptomatically stable CAD as outlined above with documentation of patent stent sites in the RCA as of February of this year. Plan is to continue current medical regimen and observation.  2. Paroxysmal atrial fibrillation, no significant recurrences noted by recent  device interrogation. She is not anticoagulated with previous history of hemorrhagic pericardial effusion. She continues on amiodarone at low-dose. Recent LFTs normal.  3. Tachycardia-bradycardia syndrome status post Medtronic pacemaker placement, followed by [name].  4. Essential hypertension, no change made to current regimen, keep follow-up with [name].  Current medicines were reviewed at length with the patient today.  Disposition: FU with me in 6 months.   Signed, Satira Sark, MD, [facility] 04/30/2015 9:41 AM    [facility] [place]. [address], [place], [place] [address] Phone: [phone]; Fax: [phone]

## 2015-04-30 NOTE — Patient Instructions (Signed)
Your physician wants you to follow-up in: 6 months with Dr.McDowell You will receive a reminder letter in the mail two months in advance. If you don't receive a letter, please call our office to schedule the follow-up appointment.     Your physician recommends that you continue on your current medications as directed. Please refer to the Current Medication list given to you today.     Thank you for choosing [facility] !        

## 2015-05-07 DIAGNOSIS — M5136 Other intervertebral disc degeneration, lumbar region: Secondary | ICD-10-CM | POA: Diagnosis not present

## 2015-05-07 DIAGNOSIS — M47816 Spondylosis without myelopathy or radiculopathy, lumbar region: Secondary | ICD-10-CM | POA: Diagnosis not present

## 2015-05-08 DIAGNOSIS — I10 Essential (primary) hypertension: Secondary | ICD-10-CM | POA: Diagnosis not present

## 2015-05-08 DIAGNOSIS — I2510 Atherosclerotic heart disease of native coronary artery without angina pectoris: Secondary | ICD-10-CM | POA: Diagnosis not present

## 2015-05-08 DIAGNOSIS — M25519 Pain in unspecified shoulder: Secondary | ICD-10-CM | POA: Diagnosis not present

## 2015-05-08 DIAGNOSIS — M4800 Spinal stenosis, site unspecified: Secondary | ICD-10-CM | POA: Diagnosis not present

## 2015-05-08 DIAGNOSIS — I480 Paroxysmal atrial fibrillation: Secondary | ICD-10-CM | POA: Diagnosis not present

## 2015-05-10 DIAGNOSIS — I509 Heart failure, unspecified: Secondary | ICD-10-CM | POA: Diagnosis not present

## 2015-05-15 ENCOUNTER — Ambulatory Visit (INDEPENDENT_AMBULATORY_CARE_PROVIDER_SITE_OTHER): Payer: Medicare Other | Admitting: Orthopedic Surgery

## 2015-05-15 DIAGNOSIS — M75101 Unspecified rotator cuff tear or rupture of right shoulder, not specified as traumatic: Secondary | ICD-10-CM

## 2015-05-15 NOTE — Patient Instructions (Signed)
We will schedule arthrogram only of shoulder and call you with appt

## 2015-05-15 NOTE — Progress Notes (Signed)
Patient ID: [name], female   DOB: 06-18-1936, 79 y.o.   MRN: [id number]  Follow up visit  Chief Complaint  Patient presents with  . Follow-up    3 week follow up right shoulder s/p injection    BP 164/106 mmHg  Ht 5\' 4"  (1.626 m)  Wt 157 lb (71.215 kg)  BMI 26.94 kg/m2  Encounter Diagnoses  Name Primary?  . Sprain of right rotator cuff capsule, subsequent encounter Yes  . Rotator cuff tear, right     Follow-up status post fall injuring right shoulder. Several years ago had prior rotator cuff repair. Complains of pain when she elevates her arm. Recent cardiac checkup showed she was in stable condition regarding any surgical intervention that might need to be done  Is on Plavix and aspirin  She has a pacemaker  Denies chest pain or shortness of breath  No real tenderness around the shoulder her active elevation is 120 passive 160 her old incision is clean dry and intact without tenderness there is no swelling around the shoulder she does have supraspinatus weakness grade 4 internal/external rotation normal in terms of strength decreased internal rotation in terms of motion. Stable in abduction external rotation. Nerve function normal good pulse and perfusion no axillary adenopathy   Recommend shoulder arthrogram rule out rotator cuff tear, come back in case we have to  schedule surgery

## 2015-05-18 ENCOUNTER — Other Ambulatory Visit (HOSPITAL_COMMUNITY): Payer: Self-pay

## 2015-05-22 ENCOUNTER — Telehealth: Payer: Self-pay | Admitting: Orthopedic Surgery

## 2015-05-22 ENCOUNTER — Other Ambulatory Visit: Payer: Self-pay | Admitting: Orthopedic Surgery

## 2015-05-22 ENCOUNTER — Ambulatory Visit (HOSPITAL_COMMUNITY)
Admission: RE | Admit: 2015-05-22 | Discharge: 2015-05-22 | Disposition: A | Discharge status: Home / Self Care | Payer: Medicare Other | Source: Ambulatory Visit | Attending: Orthopedic Surgery | Admitting: Orthopedic Surgery

## 2015-05-22 DIAGNOSIS — M75101 Unspecified rotator cuff tear or rupture of right shoulder, not specified as traumatic: Secondary | ICD-10-CM | POA: Diagnosis not present

## 2015-05-22 MED ORDER — POVIDONE-IODINE 10 % EX SOLN
CUTANEOUS | Status: AC
  Filled 2015-05-22: qty 15

## 2015-05-22 MED ORDER — IOHEXOL 350 MG/ML SOLN
50.0000 mL | Freq: Once | INTRAVENOUS | Status: DC | PRN
  Administered 2015-05-22: 10 mL via INTRA_ARTICULAR
  Filled 2015-05-22: qty 50

## 2015-05-22 MED ORDER — LIDOCAINE HCL (PF) 1 % IJ SOLN
INTRAMUSCULAR | Status: AC
  Filled 2015-05-22: qty 5

## 2015-05-22 NOTE — Telephone Encounter (Signed)
Torn rotator cuff right shoulder  Discussed with patient. Patient opts for surgical treatment.

## 2015-05-23 ENCOUNTER — Telehealth: Payer: Self-pay | Admitting: Orthopedic Surgery

## 2015-05-23 NOTE — Patient Instructions (Signed)
[name]  05/23/2015     @PREFPERIOPPHARMACY @   Your procedure is scheduled on  05/30/2015   Report to [facility] at  700  A.M.  Call this number if you have problems the morning of surgery:  [phone]   Remember:  Do not eat food or drink liquids after midnight.  Take these medicines the morning of surgery with A SIP OF WATER : Xanax, amiodarone, nexium, metorpolol, mysoline, diovan.   Do not wear jewelry, make-up or nail polish.  Do not wear lotions, powders, or perfumes.   Do not shave 48 hours prior to surgery.  Men may shave face and neck.  Do not bring valuables to the hospital.  [facility] is not responsible for any belongings or valuables.  Contacts, dentures or bridgework may not be worn into surgery.  Leave your suitcase in the car.  After surgery it may be brought to your room.  For patients admitted to the hospital, discharge time will be determined by your treatment team.  Patients discharged the day of surgery will not be allowed to drive home.   Name and phone number of your driver:   family Special instructions:  none  Please read over the following fact sheets that you were given. Pain Booklet, Coughing and Deep Breathing, Surgical Site Infection Prevention, Anesthesia Post-op Instructions and Care and Recovery After Surgery      Surgery for Rotator Cuff Tear With Rehab Rotator cuff surgery is only recommended for individuals who have experienced persistent disability for greater than 3 months of non-surgical (conservative) treatment. Surgery is not necessary but is recommended for individuals who experience difficulty completing daily activities or athletes who are unable to compete. Rotator cuff tears do not usually heal without surgical intervention. If left alone small rotator cuff tears usually become larger. Younger athletes who have a rotator cuff tear may be recommended for surgery without attempting conservative rehabilitation. The  purpose of surgery is to regain function of the shoulder joint and eliminate pain associated with the injury. In addition to repairing the tendon tear, the surgery often removes a portion of the bony roof of the shoulder (acromion) as well as the chronically thickened and inflamed membrane below the acromion (subacromial bursa). REASONS NOT TO OPERATE   Infection of the shoulder.  Inability to complete a rehabilitation program.  Patients who have other conditions (emotional or psychological) conditions that contribute to their shoulder condition. RISKS AND COMPLICATIONS  Infection.  Re-tear of the rotator cuff tendons or muscles.  Shoulder stiffness and/or weakness.  Inability to compete in athletics.  Acromioclavicular (AC) joint paint.  Risks of surgery: infection, bleeding, nerve damage, or damage to surrounding tissues. TECHNIQUE There are different surgical procedures used to treat rotator cuff tears. The type of procedure depends on the extent of injury as well as the surgeon's preference. All of the surgical techniques for rotator cuff tears have the same goal of repairing the torn tendon, removing part of the acromion, and removing the subacromial bursa. There are two main types of procedures: arthroscopic and open incision. Arthroscopic procedures are usually completed and you go home the same day as surgery (outpatient). These procedures use multiple small incisions in which tools and a video camera are placed to work on the shoulder. An electric shaver removes the bursa, then a power burr shaves down the portion of the acromion that places pressure on the rotator cuff. Finally the rotator cuff is sewed (sutured) back  to the humeral head. Open incision procedures require a larger incision. The deltoid muscle is detached from the acromion and a ligament in the shoulder (coracoacromial) is cut in order for the surgeon to access the rotator cuff. The subacromial bursa is removed as  well as part of the acromion to give the rotator cuff room to move freely. The torn tendon is then sutured to the humeral head. After the rotator cuff is repaired, then the deltoid is reattached and the incision is closed up.  RECOVERY   Post-operative care depends on the surgical technique and the preferences of your therapist.  Keep the wound clean and dry for the first 10 to 14 days after surgery.  Keep your shoulder and arm in the sling provided to you for as long as you have been instructed to.  You will be given pain medications by your caregiver.  Passive (without using muscles) shoulder movements may be begun immediately after surgery.  It is important to follow through with you rehabilitation program in order to have the best possible recovery. RETURN TO SPORTS   The rehabilitation period will depend on the sport and position you play as well as the success of the operation.  The minimum recovery period is 6 months.  You must have regained complete shoulder motion and strength before returning to sports. SEEK IMMEDIATE MEDICAL CARE IF:   Any medications produce adverse side effects.  Any complications from surgery occur:  Pain, numbness, or coldness in the extremity operated upon.  Discoloration of the nail beds (they become blue or gray) of the extremity operated upon.  Signs of infections (fever, pain, inflammation, redness, or persistent bleeding). EXERCISES  RANGE OF MOTION (ROM) AND STRETCHING EXERCISES - Rotator Cuff Tear, Surgery For These exercises may help you restore your elbow mobility once your physician has discontinued your immobilization period. Beginning these before your provider's approval may result in delayed healing. Your symptoms may resolve with or without further involvement from your physician, physical therapist or athletic trainer. While completing these exercises, remember:   Restoring tissue flexibility helps normal motion to return to the  joints. This allows healthier, less painful movement and activity.  An effective stretch should be held for at least 30 seconds. A stretch should never be painful. You should only feel a gentle lengthening or release in the stretch. ROM - Pendulum   Bend at the waist so that your right / left arm falls away from your body. Support yourself with your opposite hand on a solid surface, such as a table or a countertop.  Your right / left arm should be perpendicular to the ground. If it is not perpendicular, you need to lean over farther. Relax the muscles in your right / left arm and shoulder as much as possible.  Gently sway your hips and trunk so they move your right / left arm without any use of your right / left shoulder muscles.  Progress your movements so that your right / left arm moves side to side, then forward and backward, and finally, both clockwise and counterclockwise.  Complete __________ repetitions in each direction. Many people use this exercise to relieve discomfort in their shoulder as well as to gain range of motion. Repeat __________ times. Complete this exercise __________ times per day. STRETCH - Flexion, Seated   Sit in a firm chair so that your right / left forearm can rest on a table or on a table or countertop. Your right / left elbow should  rest below the height of your shoulder so that your shoulder feels supported and not tense or uncomfortable.  Keeping your right / left shoulder relaxed, lean forward at your waist, allowing your right / left hand to slide forward. Bend forward until you feel a moderate stretch in your shoulder, but before you feel an increase in your pain.  Hold __________ seconds. Slowly return to your starting position. Repeat __________ times. Complete this exercise __________ times per day.  STRETCH - Flexion, Standing   Stand with good posture. With an underhand grip on your right / left and an overhand grip on the opposite hand, grasp a  broomstick or cane so that your hands are a little more than shoulder-width apart.  Keeping your right / left elbow straight and shoulder muscles relaxed, push the stick with your opposite hand to raise your right / left arm in front of your body and then overhead. Raise your arm until you feel a stretch in your right / left shoulder, but before you have increased shoulder pain.  Try to avoid shrugging your right / left shoulder as your arm rises by keeping your shoulder blade tucked down and toward your mid-back spine. Hold __________ seconds.  Slowly return to the starting position. Repeat __________ times. Complete this exercise __________ times per day.  STRETCH - Abduction, Supine   Stand with good posture. With an underhand grip on your right / left and an overhand grip on the opposite hand, grasp a broomstick or cane so that your hands are a little more than shoulder-width apart.  Keeping your right / left elbow straight and shoulder muscles relaxed, push the stick with your opposite hand to raise your right / left arm out to the side of your body and then overhead. Raise your arm until you feel a stretch in your right / left shoulder, but before you have increased shoulder pain.  Try to avoid shrugging your right / left shoulder as your arm rises by keeping your shoulder blade tucked down and toward your mid-back spine. Hold __________ seconds.  Slowly return to the starting position. Repeat __________ times. Complete this exercise __________ times per day.  ROM - Flexion, Active-Assisted  Lie on your back. You may bend your knees for comfort.  Grasp a broomstick or cane so your hands are about shoulder-width apart. Your right / left hand should grip the end of the stick/cane so that your hand is positioned "thumbs-up," as if you were about to shake hands.  Using your healthy arm to lead, raise your right / left arm overhead until you feel a gentle stretch in your shoulder. Hold  __________ seconds.  Use the stick/cane to assist in returning your right / left arm to its starting position. Repeat __________ times. Complete this exercise __________ times per day.  STRETCH - External Rotation   Tuck a folded towel or small ball under your right / left upper arm. Grasp a broomstick or cane with an underhand grasp a little more than shoulder width apart. Bend your elbows to 90 degrees.  Stand with good posture or sit in a chair without arms.  Use your strong arm to push the stick across your body. Do not allow the towel or ball to fall. This will rotate your right / left arm away from your abdomen. Using the stick turn/rotate your hand and forearm away from your body. Hold __________ seconds. Repeat __________ times. Complete this exercise __________ times per day.  STRENGTHENING EXERCISES -  Rotator Cuff Tear, Surgery For These exercises may help you begin to restore your elbow strength in the initial stage of your rehabilitation. Your physician will determine when you begin these exercises depending on the severity of your injury and the integrity of your repaired tissues. Beginning these before your provider's approval may result in delayed healing. While completing these exercises, remember:   Muscles can gain both the endurance and the strength needed for everyday activities through controlled exercises.  Complete these exercises as instructed by your physician, physical therapist or athletic trainer. Progress the resistance and repetitions only as guided.  You may experience muscle soreness or fatigue, but the pain or discomfort you are trying to eliminate should never worsen during these exercises. If this pain does worsen, stop and make certain you are following the directions exactly. If the pain is still present after adjustments, discontinue the exercise until you can discuss the trouble with your clinician. STRENGTH - Shoulder Flexion, Isometric   With good  posture and facing a wall, stand or sit about 4-6 inches away.  Keeping your right / left elbow straight, gently press the top of your fist into the wall. Increase the pressure gradually until you are pressing as hard as you can without shrugging your shoulder or increasing any shoulder discomfort.  Hold __________ seconds. Release the tension slowly. Relax your shoulder muscles completely before you do the next repetition. Repeat __________ times. Complete this exercise __________ times per day.  STRENGTH - Shoulder Abductors, Isometric   With good posture, stand or sit about 4-6 inches from a wall with your right / left side facing the wall.  Bend your right / left elbow. Gently press your right / left elbow into the wall. Increase the pressure gradually until you are pressing as hard as you can without shrugging your shoulder or increasing any shoulder discomfort.  Hold __________ seconds.  Release the tension slowly. Relax your shoulder muscles completely before you do the next repetition. Repeat __________ times. Complete this exercise __________ times per day.  STRENGTH - Internal Rotators, Isometric   Keep your right / left elbow at your side and bend it 90 degrees.  Step into a door frame so that the inside of your right / left wrist can press against the door frame without your upper arm leaving your side.  Gently press your right / left wrist into the door frame as if you were trying to draw the palm of your hand to your abdomen. Gradually increase the tension until you are pressing as hard as you can without shrugging your shoulder or increasing any shoulder discomfort.  Hold __________ seconds.  Release the tension slowly. Relax your shoulder muscles completely before you do the next repetition. Repeat __________ times. Complete this exercise __________ times per day.  STRENGTH - External Rotators, Isometric   Keep your right / left elbow at your side and bend it 90  degrees.  Step into a door frame so that the outside of your right / left wrist can press against the door frame without your upper arm leaving your side.  Gently press your right / left wrist into the door frame as if you were trying to swing the back of your hand away from your abdomen. Gradually increase the tension until you are pressing as hard as you can without shrugging your shoulder or increasing any shoulder discomfort.  Hold __________ seconds.  Release the tension slowly. Relax your shoulder muscles completely before you do the  next repetition. Repeat __________ times. Complete this exercise __________ times per day.    This information is not intended to replace advice given to you by your health care provider. Make sure you discuss any questions you have with your health care provider.   Document Released: 07/21/2005 Document Revised: 12/05/2014 Document Reviewed: 11/02/2008 Elsevier Interactive Patient Education 2016 [employer] PATIENT INSTRUCTIONS POST-ANESTHESIA  IMMEDIATELY FOLLOWING SURGERY:  Do not drive or operate machinery for the first twenty four hours after surgery.  Do not make any important decisions for twenty four hours after surgery or while taking narcotic pain medications or sedatives.  If you develop intractable nausea and vomiting or a severe headache please notify your doctor immediately.  FOLLOW-UP:  Please make an appointment with your surgeon as instructed. You do not need to follow up with anesthesia unless specifically instructed to do so.  WOUND CARE INSTRUCTIONS (if applicable):  Keep a dry clean dressing on the anesthesia/puncture wound site if there is drainage.  Once the wound has quit draining you may leave it open to air.  Generally you should leave the bandage intact for twenty four hours unless there is drainage.  If the epidural site drains for more than 36-48 hours please call the anesthesia department.  QUESTIONS?:  Please feel free to  call your physician or the hospital operator if you have any questions, and they will be happy to assist you.

## 2015-05-23 NOTE — Telephone Encounter (Signed)
Regarding out-patient surgery scheduled at [facility] 05/30/15, CPT 23120-spoke with Rojelio Brenner, no pre-authorization required, reference#[id number]; also called regarding additional possible CPT codes [phone], 23410 - per Jeanice Lim, no pre-authorization required, reference#[id number], [employer], ph# [phone], 05/23/15, 2:39p.m.

## 2015-05-24 ENCOUNTER — Encounter (HOSPITAL_COMMUNITY): Payer: Self-pay

## 2015-05-24 ENCOUNTER — Encounter (HOSPITAL_COMMUNITY)
Admission: RE | Admit: 2015-05-24 | Discharge: 2015-05-24 | Disposition: A | Discharge status: Home / Self Care | Payer: Medicare Other | Source: Ambulatory Visit | Attending: Orthopedic Surgery | Admitting: Orthopedic Surgery

## 2015-05-24 DIAGNOSIS — M75101 Unspecified rotator cuff tear or rupture of right shoulder, not specified as traumatic: Secondary | ICD-10-CM | POA: Diagnosis not present

## 2015-05-24 DIAGNOSIS — Z01818 Encounter for other preprocedural examination: Secondary | ICD-10-CM | POA: Diagnosis not present

## 2015-05-24 HISTORY — DX: Presence of cardiac pacemaker: Z95.0

## 2015-05-24 HISTORY — DX: Unspecified hearing loss, unspecified ear: H91.90

## 2015-05-24 LAB — BASIC METABOLIC PANEL
ANION GAP: 7 (ref 5–15)
BUN: 8 mg/dL (ref 6–20)
CALCIUM: 9.6 mg/dL (ref 8.9–10.3)
CO2: 29 mmol/L (ref 22–32)
Chloride: 103 mmol/L (ref 101–111)
Creatinine, Ser: 0.81 mg/dL (ref 0.44–1.00)
GLUCOSE: 94 mg/dL (ref 65–99)
Potassium: 4.9 mmol/L (ref 3.5–5.1)
Sodium: 139 mmol/L (ref 135–145)

## 2015-05-24 LAB — CBC WITH DIFFERENTIAL/PLATELET
BASOS ABS: 0 10*3/uL (ref 0.0–0.1)
Basophils Relative: 1 %
EOS ABS: 0.1 10*3/uL (ref 0.0–0.7)
EOS PCT: 2 %
HCT: 40.5 % (ref 36.0–46.0)
Hemoglobin: 12.9 g/dL (ref 12.0–15.0)
Lymphocytes Relative: 41 %
Lymphs Abs: 2.1 10*3/uL (ref 0.7–4.0)
MCH: 28.5 pg (ref 26.0–34.0)
MCHC: 31.9 g/dL (ref 30.0–36.0)
MCV: 89.6 fL (ref 78.0–100.0)
MONO ABS: 0.6 10*3/uL (ref 0.1–1.0)
Monocytes Relative: 11 %
Neutro Abs: 2.4 10*3/uL (ref 1.7–7.7)
Neutrophils Relative %: 45 %
PLATELETS: 209 10*3/uL (ref 150–400)
RBC: 4.52 MIL/uL (ref 3.87–5.11)
RDW: 16.8 % — AB (ref 11.5–15.5)
WBC: 5.2 10*3/uL (ref 4.0–10.5)

## 2015-05-24 NOTE — Pre-Procedure Instructions (Signed)
Extensive medical and cardiac history reviewed with Dr Patsey Berthold. No orders given.

## 2015-05-29 NOTE — H&P (Signed)
Outpatient surgery history and physical     Chief Complaint   Patient presents with   .  Shoulder Pain       right shoulder pain, referred by Z HALL     HPI [name] is a 79 y.o. female.   HPI  [name] had a successful rotator cuff repair a few years ago and now presents with pain after she fell on her right shoulder last year. After the fall she knows that she could move her arm as well especially with overhead activity and she had increasing shoulder pain for one year. Mild weakness as well and peri-acromial pain no neck pain.  She had arthrogram which showed she had a rotator cuff tear. She is admitted as an outpatient for surgical repair     Review of Systems Review of Systems Review of systems denies numbness tingling fever chills or swelling, no chest pain. Anxiety. All other systems reviewed and negative except for generalized joint pain    Past Medical History   Diagnosis  Date   .  Atrial fibrillation     .  Coronary atherosclerosis of native coronary artery         DES x 2 to RCA 10/10   .  GERD (gastroesophageal reflux disease)     .  Tachycardia-bradycardia syndrome         s/p Medtronic Adapta L dual chamber device  5/10   .  Hyperlipidemia     .  COPD (chronic obstructive pulmonary disease)     .  Anxiety     .  Resting tremor     .  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  Chronic back pain     .  Pericardial effusion         Hemorrhagic    .  Essential hypertension, benign     .  Headache(784.0)     .  H/O hiatal hernia     .  Neuromuscular disorder         Tremors       Past Surgical History   Procedure  Laterality  Date   .  Cholecystectomy       .  Appendectomy       .  Subxiphoid pericardial window    11/10   .  Esophagogastroduodenoscopy with esophageal dilation    2004, 2006, 2007   .  Vaginal hysterectomy       .  Colonoscopy    2011   .  Right rotator cuff repair       .  Insert / replace / remove pacemaker       .   Left heart catheterization with coronary angiogram  N/A  11/17/2011       Procedure: LEFT HEART CATHETERIZATION WITH CORONARY ANGIOGRAM;  Surgeon: Sherren Mocha, MD;  Location[facility] C[facility] LAB;  Service: Cardiovascular;  Laterality: N/A;   .  Left heart catheterization with coronary angiogram  N/A  09/26/2014       Procedure: LEFT HEART CATHETERIZATION WITH CORONARY ANGIOGRAM;  Surgeon: Leonie Man, MD;  Location: [facility] CATH LAB;  Service: Cardiovascular;  Laterality: N/A;   .  Colonoscopy  N/A  10/19/2014       Procedure: COLONOSCOPY;  Surgeon: Rogene Houston, MD;  Location: [facility] ENDO SUITE;  Service: Endoscopy;  Laterality: N/A;  1030   .  Esophagogastroduodenoscopy  N/A  10/26/2014  Procedure: ESOPHAGOGASTRODUODENOSCOPY (EGD);  Surgeon: Rogene Houston, MD;  Location: [facility] ENDO SUITE;  Service: Endoscopy;  Laterality: N/A;  230 - Dr. has lunch and learn   .  Givens capsule study  N/A  10/31/2014       Procedure: GIVENS CAPSULE STUDY;  Surgeon: Rogene Houston, MD;  Location: [facility] ENDO SUITE;  Service: Endoscopy;  Laterality: N/A;  730 -- pacemaker--needs monitoring--outpatient bed       Family History   Problem  Relation  Age of Onset   .  Cancer  Mother         Colon    .  Coronary artery disease  Sister     .  Coronary artery disease  Brother     .  Arthritis       .  Lung disease       .  Asthma         Social History Social History   Substance Use Topics   .  Smoking status:  Never Smoker    .  Smokeless tobacco:  Never Used   .  Alcohol Use:  No       Allergies   Allergen  Reactions   .  Amitriptyline Hcl  Other (See Comments)       Caused "jaws to twist and lock"   .  Sulfonamide Derivatives  Other (See Comments)       UNKNOWN REACTION       Current Outpatient Prescriptions   Medication  Sig  Dispense  Refill   .  ALPRAZolam (XANAX) 1 MG tablet  Take by mouth at bedtime. 1/2 tab at night       .  amiodarone (PACERONE) 100 MG tablet  Take 100 mg by mouth daily.       Marland Kitchen   aspirin 81 MG tablet  Take 81 mg by mouth daily.       .  clopidogrel (PLAVIX) 75 MG tablet  Take 75 mg by mouth.        .  Cyanocobalamin (B-12 PO)  Take 1 tablet by mouth daily as needed (for supplement).       Marland Kitchen  esomeprazole (NEXIUM) 40 MG capsule  Take 1 po BID x 2 weeks then once a day  60 capsule  0   .  furosemide (LASIX) 40 MG tablet  Take 40-60 mg by mouth daily as needed (for swelling).        .  hydrocortisone (ANUSOL-HC) 25 MG suppository  Place 1 suppository (25 mg total) rectally at bedtime.  14 suppository  1   .  isosorbide mononitrate (IMDUR) 30 MG 24 hr tablet  Take 30 mg by mouth daily.       .  metoprolol (TOPROL-XL) 50 MG 24 hr tablet  Take 75 mg by mouth every morning.        .  nitroGLYCERIN (NITROSTAT) 0.4 MG SL tablet  Place 0.4 mg under the tongue every 5 (five) minutes as needed for chest pain.        .  potassium chloride SA (K-DUR,KLOR-CON) 20 MEQ tablet  Take 20-40 mEq by mouth daily as needed (Takes 71mEq daily, but increases to 67mEq as needed for cramping.).        Marland Kitchen  primidone (MYSOLINE) 50 MG tablet  Take 50 mg by mouth 2 (two) times daily as needed (for tremors).        .  simvastatin (ZOCOR) 5 MG tablet  Take 5  mg by mouth daily.       Marland Kitchen  thiamine (VITAMIN B-1) 100 MG tablet  Take 100 mg by mouth daily.       .  valsartan (DIOVAN) 40 MG tablet  Take 40 mg by mouth as needed.        Marland Kitchen  VITAMIN D, CHOLECALCIFEROL, PO  Take 1 capsule by mouth daily.        .  vitamin E 400 UNIT capsule  Take 400 Units by mouth daily.          No current facility-administered medications for this visit.        Physical Exam Blood pressure 156/89, height 5\' 4"  (1.626 m), weight 161 lb (73.029 kg). Physical Exam The patient is well developed well nourished and well groomed. Orientation to person place and time is normal   Mood is pleasant. Ambulatory status  no assistive devices needed Cervical spine exam is as follows  decreased range of motion with crepitance mild  tenderness lateral or right side of the cervical spine   Right shoulder  Examination: Inspection reveals tenderness around the peri-acromial region. The patient has decreased range of motion and 4/5 motor function of the rotator cuff. Stability in abduction external rotation is normal. Neurovascular examination is intact and the lymph nodes in the axilla and supraclavicular regions are normal   The opposite shoulder exhibits normal range of motion stability and strength neurovascular exam is intact lymph nodes are negative and there is no swelling or tenderness  Lower extremities no clubbing cyanosis or edema muscle tone normal knee flexion extension normal no malalignment neurovascular exam intact   Data Reviewed  independent image interpretation :   She has mild degenerative changes in the glenohumeral area on x-ray  Arthrogram shows rotator cuff tear with leak of dye  Assessment    Rotator cuff tear , right shoulder    Plan    Open rotator cuff repair right shoulder

## 2015-05-30 ENCOUNTER — Ambulatory Visit (HOSPITAL_COMMUNITY)
Admission: RE | Admit: 2015-05-30 | Discharge: 2015-05-31 | Disposition: A | Discharge status: Home / Self Care | Payer: Medicare Other | Source: Ambulatory Visit | Attending: Orthopedic Surgery | Admitting: Orthopedic Surgery

## 2015-05-30 ENCOUNTER — Encounter (HOSPITAL_COMMUNITY)
Admission: RE | Disposition: A | Discharge status: Home / Self Care | Payer: Medicare Other | Source: Ambulatory Visit | Attending: Orthopedic Surgery

## 2015-05-30 ENCOUNTER — Encounter (HOSPITAL_COMMUNITY): Payer: Self-pay | Admitting: *Deleted

## 2015-05-30 ENCOUNTER — Ambulatory Visit (HOSPITAL_COMMUNITY): Payer: Medicare Other | Admitting: Anesthesiology

## 2015-05-30 DIAGNOSIS — E785 Hyperlipidemia, unspecified: Secondary | ICD-10-CM | POA: Insufficient documentation

## 2015-05-30 DIAGNOSIS — M75122 Complete rotator cuff tear or rupture of left shoulder, not specified as traumatic: Secondary | ICD-10-CM | POA: Insufficient documentation

## 2015-05-30 DIAGNOSIS — I241 Dressler's syndrome: Secondary | ICD-10-CM | POA: Insufficient documentation

## 2015-05-30 DIAGNOSIS — K219 Gastro-esophageal reflux disease without esophagitis: Secondary | ICD-10-CM | POA: Diagnosis not present

## 2015-05-30 DIAGNOSIS — F419 Anxiety disorder, unspecified: Secondary | ICD-10-CM | POA: Diagnosis not present

## 2015-05-30 DIAGNOSIS — I4891 Unspecified atrial fibrillation: Secondary | ICD-10-CM | POA: Insufficient documentation

## 2015-05-30 DIAGNOSIS — G8929 Other chronic pain: Secondary | ICD-10-CM | POA: Diagnosis not present

## 2015-05-30 DIAGNOSIS — K449 Diaphragmatic hernia without obstruction or gangrene: Secondary | ICD-10-CM | POA: Insufficient documentation

## 2015-05-30 DIAGNOSIS — Z87891 Personal history of nicotine dependence: Secondary | ICD-10-CM | POA: Diagnosis not present

## 2015-05-30 DIAGNOSIS — Z7982 Long term (current) use of aspirin: Secondary | ICD-10-CM | POA: Diagnosis not present

## 2015-05-30 DIAGNOSIS — I2510 Atherosclerotic heart disease of native coronary artery without angina pectoris: Secondary | ICD-10-CM | POA: Diagnosis not present

## 2015-05-30 DIAGNOSIS — Z950 Presence of cardiac pacemaker: Secondary | ICD-10-CM | POA: Diagnosis not present

## 2015-05-30 DIAGNOSIS — M75111 Incomplete rotator cuff tear or rupture of right shoulder, not specified as traumatic: Secondary | ICD-10-CM | POA: Insufficient documentation

## 2015-05-30 DIAGNOSIS — I495 Sick sinus syndrome: Secondary | ICD-10-CM | POA: Insufficient documentation

## 2015-05-30 DIAGNOSIS — G252 Other specified forms of tremor: Secondary | ICD-10-CM | POA: Insufficient documentation

## 2015-05-30 DIAGNOSIS — M75101 Unspecified rotator cuff tear or rupture of right shoulder, not specified as traumatic: Secondary | ICD-10-CM

## 2015-05-30 DIAGNOSIS — Z79899 Other long term (current) drug therapy: Secondary | ICD-10-CM | POA: Diagnosis not present

## 2015-05-30 DIAGNOSIS — M549 Dorsalgia, unspecified: Secondary | ICD-10-CM | POA: Diagnosis not present

## 2015-05-30 DIAGNOSIS — J449 Chronic obstructive pulmonary disease, unspecified: Secondary | ICD-10-CM | POA: Insufficient documentation

## 2015-05-30 DIAGNOSIS — Z888 Allergy status to other drugs, medicaments and biological substances status: Secondary | ICD-10-CM | POA: Diagnosis not present

## 2015-05-30 DIAGNOSIS — Z882 Allergy status to sulfonamides status: Secondary | ICD-10-CM | POA: Diagnosis not present

## 2015-05-30 DIAGNOSIS — Z9181 History of falling: Secondary | ICD-10-CM | POA: Insufficient documentation

## 2015-05-30 DIAGNOSIS — I10 Essential (primary) hypertension: Secondary | ICD-10-CM | POA: Insufficient documentation

## 2015-05-30 HISTORY — PX: SHOULDER ACROMIOPLASTY: SHX6093

## 2015-05-30 HISTORY — PX: SHOULDER OPEN ROTATOR CUFF REPAIR: SHX2407

## 2015-05-30 SURGERY — REPAIR, ROTATOR CUFF, OPEN
Anesthesia: General | Site: Shoulder | Laterality: Right

## 2015-05-30 MED ORDER — MIDAZOLAM HCL 2 MG/2ML IJ SOLN
INTRAMUSCULAR | Status: AC
  Filled 2015-05-30: qty 4

## 2015-05-30 MED ORDER — ONDANSETRON HCL 4 MG/2ML IJ SOLN: 4.0000 mg | Freq: Once | INTRAMUSCULAR | Status: DC | PRN

## 2015-05-30 MED ORDER — BUPIVACAINE-EPINEPHRINE (PF) 0.5% -1:200000 IJ SOLN
INTRAMUSCULAR | Status: DC | PRN
  Administered 2015-05-30: 60 mL via PERINEURAL

## 2015-05-30 MED ORDER — VITAMIN B-1 100 MG PO TABS
100.0000 mg | ORAL_TABLET | Freq: Every day | ORAL | Status: DC
  Administered 2015-05-31: 100 mg via ORAL
  Filled 2015-05-30: qty 1

## 2015-05-30 MED ORDER — SIMVASTATIN 10 MG PO TABS
5.0000 mg | ORAL_TABLET | Freq: Every day | ORAL | Status: DC
  Administered 2015-05-30: 5 mg via ORAL
  Filled 2015-05-30: qty 1

## 2015-05-30 MED ORDER — MIDAZOLAM HCL 2 MG/2ML IJ SOLN
INTRAMUSCULAR | Status: AC
  Filled 2015-05-30: qty 2

## 2015-05-30 MED ORDER — ONDANSETRON HCL 4 MG PO TABS
4.0000 mg | ORAL_TABLET | Freq: Four times a day (QID) | ORAL | Status: DC | PRN
  Administered 2015-05-31: 4 mg via ORAL
  Filled 2015-05-30: qty 1

## 2015-05-30 MED ORDER — DEXTROSE 5 % IV SOLN
500.0000 mg | Freq: Four times a day (QID) | INTRAVENOUS | Status: DC
  Filled 2015-05-30 (×9): qty 5

## 2015-05-30 MED ORDER — METOCLOPRAMIDE HCL 5 MG/ML IJ SOLN
5.0000 mg | Freq: Three times a day (TID) | INTRAMUSCULAR | Status: DC | PRN
  Administered 2015-05-30: 10 mg via INTRAVENOUS
  Filled 2015-05-30: qty 2

## 2015-05-30 MED ORDER — FENTANYL CITRATE (PF) 250 MCG/5ML IJ SOLN
INTRAMUSCULAR | Status: AC
  Filled 2015-05-30: qty 25

## 2015-05-30 MED ORDER — LACTATED RINGERS IV SOLN
INTRAVENOUS | Status: DC
  Administered 2015-05-30: 08:00:00 via INTRAVENOUS

## 2015-05-30 MED ORDER — DIPHENHYDRAMINE HCL 12.5 MG/5ML PO ELIX: 12.5000 mg | ORAL_SOLUTION | ORAL | Status: DC | PRN

## 2015-05-30 MED ORDER — METOPROLOL SUCCINATE ER 50 MG PO TB24
75.0000 mg | ORAL_TABLET | ORAL | Status: DC
  Filled 2015-05-30 (×4): qty 1

## 2015-05-30 MED ORDER — ALPRAZOLAM 0.25 MG PO TABS
0.2500 mg | ORAL_TABLET | Freq: Every day | ORAL | Status: DC
  Administered 2015-05-30: 0.25 mg via ORAL
  Filled 2015-05-30: qty 1

## 2015-05-30 MED ORDER — IRBESARTAN 75 MG PO TABS
37.5000 mg | ORAL_TABLET | Freq: Every day | ORAL | Status: DC
  Administered 2015-05-31: 37.5 mg via ORAL
  Filled 2015-05-30: qty 1

## 2015-05-30 MED ORDER — POTASSIUM CHLORIDE CRYS ER 20 MEQ PO TBCR: 20.0000 meq | EXTENDED_RELEASE_TABLET | Freq: Every day | ORAL | Status: DC | PRN

## 2015-05-30 MED ORDER — ROCURONIUM BROMIDE 100 MG/10ML IV SOLN
INTRAVENOUS | Status: DC | PRN
  Administered 2015-05-30: 35 mg via INTRAVENOUS
  Administered 2015-05-30: 5 mg via INTRAVENOUS

## 2015-05-30 MED ORDER — FUROSEMIDE 40 MG PO TABS: 40.0000 mg | ORAL_TABLET | Freq: Every day | ORAL | Status: DC | PRN

## 2015-05-30 MED ORDER — KETOROLAC TROMETHAMINE 15 MG/ML IJ SOLN
7.5000 mg | Freq: Four times a day (QID) | INTRAMUSCULAR | Status: DC
  Administered 2015-05-31 (×2): 7.5 mg via INTRAVENOUS
  Filled 2015-05-30 (×2): qty 1

## 2015-05-30 MED ORDER — MIDAZOLAM HCL 2 MG/2ML IJ SOLN
1.0000 mg | INTRAMUSCULAR | Status: DC | PRN
  Administered 2015-05-30: 2 mg via INTRAVENOUS

## 2015-05-30 MED ORDER — CEFAZOLIN SODIUM-DEXTROSE 2-3 GM-% IV SOLR
2.0000 g | INTRAVENOUS | Status: AC
  Administered 2015-05-30: 2 g via INTRAVENOUS
  Filled 2015-05-30: qty 50

## 2015-05-30 MED ORDER — FENTANYL CITRATE (PF) 100 MCG/2ML IJ SOLN
INTRAMUSCULAR | Status: DC | PRN
  Administered 2015-05-30: 50 ug via INTRAVENOUS
  Administered 2015-05-30: 100 ug via INTRAVENOUS
  Administered 2015-05-30 (×2): 50 ug via INTRAVENOUS

## 2015-05-30 MED ORDER — PRIMIDONE 50 MG PO TABS
50.0000 mg | ORAL_TABLET | Freq: Two times a day (BID) | ORAL | Status: DC | PRN
  Filled 2015-05-30: qty 1

## 2015-05-30 MED ORDER — SENNA 8.6 MG PO TABS
1.0000 | ORAL_TABLET | Freq: Two times a day (BID) | ORAL | Status: DC
  Administered 2015-05-30 – 2015-05-31 (×2): 8.6 mg via ORAL
  Filled 2015-05-30 (×2): qty 1

## 2015-05-30 MED ORDER — ASPIRIN 81 MG PO CHEW
81.0000 mg | CHEWABLE_TABLET | Freq: Every day | ORAL | Status: DC
  Administered 2015-05-31: 81 mg via ORAL
  Filled 2015-05-30: qty 1

## 2015-05-30 MED ORDER — SODIUM CHLORIDE 0.9 % IJ SOLN
INTRAMUSCULAR | Status: AC
  Filled 2015-05-30: qty 10

## 2015-05-30 MED ORDER — MENTHOL 3 MG MT LOZG: 1.0000 | LOZENGE | OROMUCOSAL | Status: DC | PRN

## 2015-05-30 MED ORDER — CHLORHEXIDINE GLUCONATE 4 % EX LIQD: 60.0000 mL | Freq: Once | CUTANEOUS | Status: DC

## 2015-05-30 MED ORDER — PROPOFOL 10 MG/ML IV BOLUS
INTRAVENOUS | Status: DC | PRN
  Administered 2015-05-30: 100 mg via INTRAVENOUS

## 2015-05-30 MED ORDER — CYANOCOBALAMIN 500 MCG PO TABS
250.0000 ug | ORAL_TABLET | Freq: Every day | ORAL | Status: DC | PRN
  Filled 2015-05-30: qty 1

## 2015-05-30 MED ORDER — CEFAZOLIN SODIUM 1-5 GM-% IV SOLN
1.0000 g | Freq: Four times a day (QID) | INTRAVENOUS | Status: AC
  Administered 2015-05-30 – 2015-05-31 (×3): 1 g via INTRAVENOUS
  Filled 2015-05-30 (×3): qty 50

## 2015-05-30 MED ORDER — PHENOL 1.4 % MT LIQD: 1.0000 | OROMUCOSAL | Status: DC | PRN

## 2015-05-30 MED ORDER — BUPIVACAINE-EPINEPHRINE (PF) 0.5% -1:200000 IJ SOLN
INTRAMUSCULAR | Status: AC
Start: 2015-05-30 — End: 2015-05-30
  Filled 2015-05-30: qty 60

## 2015-05-30 MED ORDER — EPHEDRINE SULFATE 50 MG/ML IJ SOLN
INTRAMUSCULAR | Status: AC
  Filled 2015-05-30: qty 1

## 2015-05-30 MED ORDER — EPHEDRINE SULFATE 50 MG/ML IJ SOLN
INTRAMUSCULAR | Status: DC | PRN
  Administered 2015-05-30: 10 mg via INTRAVENOUS

## 2015-05-30 MED ORDER — PANTOPRAZOLE SODIUM 40 MG PO TBEC
40.0000 mg | DELAYED_RELEASE_TABLET | Freq: Every day | ORAL | Status: DC
  Administered 2015-05-31: 40 mg via ORAL
  Filled 2015-05-30: qty 1

## 2015-05-30 MED ORDER — VITAMIN E 180 MG (400 UNIT) PO CAPS
400.0000 [IU] | ORAL_CAPSULE | Freq: Every day | ORAL | Status: DC
  Administered 2015-05-31: 400 [IU] via ORAL
  Filled 2015-05-30 (×2): qty 1

## 2015-05-30 MED ORDER — GLYCOPYRROLATE 0.2 MG/ML IJ SOLN
INTRAMUSCULAR | Status: DC | PRN
  Administered 2015-05-30: 0.2 mg via INTRAVENOUS

## 2015-05-30 MED ORDER — POTASSIUM CHLORIDE IN NACL 20-0.9 MEQ/L-% IV SOLN
INTRAVENOUS | Status: DC
  Administered 2015-05-30 – 2015-05-31 (×2): via INTRAVENOUS

## 2015-05-30 MED ORDER — 0.9 % SODIUM CHLORIDE (POUR BTL) OPTIME
TOPICAL | Status: DC | PRN
  Administered 2015-05-30: 1000 mL

## 2015-05-30 MED ORDER — METOCLOPRAMIDE HCL 10 MG PO TABS: 5.0000 mg | ORAL_TABLET | Freq: Three times a day (TID) | ORAL | Status: DC | PRN

## 2015-05-30 MED ORDER — NEOSTIGMINE METHYLSULFATE 10 MG/10ML IV SOLN
INTRAVENOUS | Status: DC | PRN
  Administered 2015-05-30: 2 mg via INTRAVENOUS

## 2015-05-30 MED ORDER — HYDROCODONE-ACETAMINOPHEN 7.5-325 MG PO TABS
1.0000 | ORAL_TABLET | Freq: Four times a day (QID) | ORAL | Status: DC
  Administered 2015-05-30 – 2015-05-31 (×3): 1 via ORAL
  Filled 2015-05-30 (×3): qty 1

## 2015-05-30 MED ORDER — FENTANYL CITRATE (PF) 100 MCG/2ML IJ SOLN
25.0000 ug | INTRAMUSCULAR | Status: DC | PRN
  Administered 2015-05-30: 50 ug via INTRAVENOUS
  Filled 2015-05-30: qty 2

## 2015-05-30 MED ORDER — ONDANSETRON HCL 4 MG/2ML IJ SOLN
4.0000 mg | Freq: Four times a day (QID) | INTRAMUSCULAR | Status: DC | PRN
  Administered 2015-05-30: 4 mg via INTRAVENOUS
  Filled 2015-05-30: qty 2

## 2015-05-30 MED ORDER — MORPHINE SULFATE (PF) 2 MG/ML IV SOLN: 2.0000 mg | INTRAVENOUS | Status: DC | PRN

## 2015-05-30 MED ORDER — METHOCARBAMOL 500 MG PO TABS
500.0000 mg | ORAL_TABLET | Freq: Four times a day (QID) | ORAL | Status: DC
  Administered 2015-05-30 – 2015-05-31 (×3): 500 mg via ORAL
  Filled 2015-05-30 (×3): qty 1

## 2015-05-30 MED ORDER — AMIODARONE HCL 200 MG PO TABS
100.0000 mg | ORAL_TABLET | Freq: Every day | ORAL | Status: DC
  Administered 2015-05-31: 100 mg via ORAL
  Filled 2015-05-30: qty 1

## 2015-05-30 MED ORDER — LIDOCAINE HCL 1 % IJ SOLN
INTRAMUSCULAR | Status: DC | PRN
  Administered 2015-05-30: 30 mg via INTRADERMAL

## 2015-05-30 MED ORDER — NITROGLYCERIN 0.4 MG SL SUBL: 0.4000 mg | SUBLINGUAL_TABLET | SUBLINGUAL | Status: DC | PRN

## 2015-05-30 SURGICAL SUPPLY — 57 items
ANCH SUT 2 CRKSW 15.5X5 (Anchor) ×1 IMPLANT
ANCHOR SUT CORKSCREW 5X15.5 (Anchor) ×1 IMPLANT
ANCHOR SUT CORKSCREW 5X15.5MM (Anchor) ×1 IMPLANT
BIT DRILL 2.0MX128MM (BIT) IMPLANT
BLADE HEX COATED 2.75 (ELECTRODE) ×3 IMPLANT
BLADE OSC/SAGITTAL MD 9X18.5 (BLADE) ×3 IMPLANT
BUR FAST CUTTING (BURR)
BUR ROUND 5.0 (BURR) IMPLANT
BUR SRG 54X4.7X12 FLUT (BURR) IMPLANT
BURR SRG 54X4.7X12 FLUT (BURR)
CHLORAPREP W/TINT 26ML (MISCELLANEOUS) ×3 IMPLANT
CLOTH BEACON ORANGE TIMEOUT ST (SAFETY) ×3 IMPLANT
COVER LIGHT HANDLE STERIS (MISCELLANEOUS) ×6 IMPLANT
COVER PROBE W GEL 5X96 (DRAPES) ×3 IMPLANT
DRAPE PROXIMA HALF (DRAPES) ×3 IMPLANT
DRESSING ALLEVYN BORDER 5X5 (GAUZE/BANDAGES/DRESSINGS) ×3 IMPLANT
ELECT REM PT RETURN 9FT ADLT (ELECTROSURGICAL) ×3
ELECTRODE REM PT RTRN 9FT ADLT (ELECTROSURGICAL) ×1 IMPLANT
GLOVE BIOGEL M 7.0 STRL (GLOVE) ×4 IMPLANT
GLOVE BIOGEL PI IND STRL 7.0 (GLOVE) IMPLANT
GLOVE BIOGEL PI INDICATOR 7.0 (GLOVE) ×4
GLOVE EXAM NITRILE MD LF STRL (GLOVE) ×2 IMPLANT
GLOVE SKINSENSE NS SZ8.0 LF (GLOVE) ×2
GLOVE SKINSENSE STRL SZ8.0 LF (GLOVE) ×1 IMPLANT
GLOVE SS N UNI LF 8.5 STRL (GLOVE) ×3 IMPLANT
GOWN STRL REUS W/TWL LRG LVL3 (GOWN DISPOSABLE) ×6 IMPLANT
GOWN STRL REUS W/TWL XL LVL3 (GOWN DISPOSABLE) ×3 IMPLANT
IMMOBILIZER SHOULDER LGE (ORTHOPEDIC SUPPLIES) IMPLANT
IMMOBILIZER SHOULDER MED (ORTHOPEDIC SUPPLIES) ×2 IMPLANT
INST SET MINOR BONE (KITS) ×3 IMPLANT
KIT BLADEGUARD II DBL (SET/KITS/TRAYS/PACK) ×3 IMPLANT
KIT ROOM TURNOVER APOR (KITS) ×3 IMPLANT
MANIFOLD NEPTUNE II (INSTRUMENTS) ×3 IMPLANT
MARKER SKIN DUAL TIP RULER LAB (MISCELLANEOUS) ×3 IMPLANT
NDL HYPO 21X1.5 SAFETY (NEEDLE) ×1 IMPLANT
NDL MA TROC 1/2 (NEEDLE) IMPLANT
NDL MAYO 6 CRC TAPER PT (NEEDLE) IMPLANT
NEEDLE HYPO 21X1.5 SAFETY (NEEDLE) ×3 IMPLANT
NEEDLE MA TROC 1/2 (NEEDLE) ×3 IMPLANT
NEEDLE MAYO 6 CRC TAPER PT (NEEDLE) IMPLANT
NS IRRIG 1000ML POUR BTL (IV SOLUTION) ×3 IMPLANT
PACK TOTAL JOINT (CUSTOM PROCEDURE TRAY) ×3 IMPLANT
PAD ARMBOARD 7.5X6 YLW CONV (MISCELLANEOUS) ×3 IMPLANT
PASSER SUT CAPTURE FIRST (SUTURE) IMPLANT
PASSER SUT SWANSON 36MM LOOP (INSTRUMENTS) ×1 IMPLANT
RASP SM TEAR CROSS CUT (RASP) ×2 IMPLANT
SET BASIN LINEN APH (SET/KITS/TRAYS/PACK) ×3 IMPLANT
STAPLER VISISTAT 35W (STAPLE) ×2 IMPLANT
SUT BONE WAX W31G (SUTURE) IMPLANT
SUT ETHIBOND NAB OS 4 #2 30IN (SUTURE) ×3 IMPLANT
SUT ETHILON 3 0 FSL (SUTURE) IMPLANT
SUT MON AB 0 CT1 (SUTURE) ×3 IMPLANT
SUT MON AB 2-0 CT1 36 (SUTURE) ×3 IMPLANT
SUT PROLENE 3 0 PS 1 (SUTURE) IMPLANT
SYR 30ML LL (SYRINGE) ×3 IMPLANT
SYR BULB IRRIGATION 50ML (SYRINGE) ×3 IMPLANT
YANKAUER SUCT 12FT TUBE ARGYLE (SUCTIONS) ×3 IMPLANT

## 2015-05-30 NOTE — Interval H&P Note (Signed)
History and Physical Interval Note:  05/30/2015 8:16 AM  [name]  has presented today for surgery, with the diagnosis of tear right rotator cuff  The various methods of treatment have been discussed with the patient and family. After consideration of risks, benefits and other options for treatment, the patient has consented to  Procedure(s): ROTATOR CUFF REPAIR SHOULDER OPEN (Right) as a surgical intervention .  The patient's history has been reviewed, patient examined, no change in status, stable for surgery.  I have reviewed the patient's chart and labs.  Questions were answered to the patient's satisfaction.     Arther Abbott

## 2015-05-30 NOTE — Anesthesia Postprocedure Evaluation (Signed)
  Anesthesia Post-op Note  Patient: [name]  Procedure(s) Performed: Procedure(s): OPEN ROTATOR CUFF REPAIR RIGHT SHOULDER (Right) RIGHT SHOULDER ACROMIOPLASTY (Right)  Patient Location: PACU  Anesthesia Type:General  Level of Consciousness: awake, alert , oriented and patient cooperative  Airway and Oxygen Therapy: Patient Spontanous Breathing  Post-op Pain: 4 /10, moderate  Post-op Assessment: Post-op Vital signs reviewed, Patient's Cardiovascular Status Stable, Respiratory Function Stable, Patent Airway and No signs of Nausea or vomiting              Post-op Vital Signs: Reviewed and stable  Last Vitals:  Filed Vitals:   05/30/15 0825  BP: 151/85  Resp: 13    Complications: No apparent anesthesia complications

## 2015-05-30 NOTE — Anesthesia Procedure Notes (Signed)
Procedure Name: Intubation Date/Time: 05/30/2015 8:42 AM Performed by: Charmaine Downs Pre-anesthesia Checklist: Emergency Drugs available, Patient identified, Suction available and Patient being monitored Patient Re-evaluated:Patient Re-evaluated prior to inductionOxygen Delivery Method: Circle system utilized Preoxygenation: Pre-oxygenation with 100% oxygen Intubation Type: IV induction Ventilation: Mask ventilation without difficulty and Oral airway inserted - appropriate to patient size Laryngoscope Size: Mac and 3 Grade View: Grade I Tube type: Oral Tube size: 7.0 mm Number of attempts: 1 Airway Equipment and Method: Stylet and Oral airway Placement Confirmation: ETT inserted through vocal cords under direct vision,  positive ETCO2 and breath sounds checked- equal and bilateral Secured at: 22 cm Tube secured with: Tape Dental Injury: Teeth and Oropharynx as per pre-operative assessment

## 2015-05-30 NOTE — Op Note (Signed)
Operative report  Date of dictation 05/30/2015 date of surgery same  Diagnosis recurrent right rotator cuff tear.  This is a 79 year old female who fell and reinjured her right rotator cuff. She had an arthrogram showed a small tear. She was having pain and loss of function of the right shoulder and despite conservative management which include pain medication, injection in home therapy she did not improve and opted for surgical intervention  Postoperative diagnosis recurrent right rotator cuff tear  Procedure open right rotator cuff tear with acromioplasty  Surgeon Aline Brochure  Assisted by Simonne Maffucci  Gen. anesthesia  Operative findings type III acromion. Small tear the supraspinatus tendon minimal retraction. Previous portions of the repair were still intact.  Surgery was done as follows. Patient's identified in the preop holding area. Chart review was completed surgical site was marked as right shoulder  The patient was allergic to the operating room where she had general anesthesia. We administered 2 g of IV Ancef.  In the modified beachchair position the right arm and shoulder were prepped and draped sterilely  After this timeout procedure was completed  The previous incision was used. After dividing the skin down to the deltoid fascia. The previous repair of the deltoid was used to open the deltoid interval. We took this down to the bursa and a bursectomy. We then did an acromioplasty using a saw and a rasp.  The tear was found to be in the supraspinatus tendon with minimal retraction approximate centimeter and a half.  We irrigated the wound check the joint there was no delamination of the supraspinous fibers. There is no intra-articular loose bodies.  We then placed a corkscrew suture anchor in the greater tuberosity after preparing the tuberosity with cortical debridement.  The 2 suture limbs were passed through the rotator cuff and tied repairing the supraspinatus tendon.  The arm was taken through range of motion and there was no restriction in motion.  The wound was then irrigated. The deltoid split was closed with #2 Ethibond suture subcutaneous tissue closed with 0 Monocryl suture. Subdeltoid injection with Marcaine with epinephrine 30 mL and then 30 mL injected into the skin. Skin edges reapproximated with staples  Sling-and-swathe were applied to the right shoulder.  Patient was extubated and taken recovery room in stable condition

## 2015-05-30 NOTE — Brief Op Note (Signed)
05/30/2015  9:53 AM  PATIENT:  Rich Number  79 y.o. female  PRE-OPERATIVE DIAGNOSIS:  tear right rotator cuff  POST-OPERATIVE DIAGNOSIS:  tear right rotator cuff  PROCEDURE:  Procedure(s): OPEN ROTATOR CUFF REPAIR RIGHT SHOULDER (Right) RIGHT SHOULDER ACROMIOPLASTY (Right)  SURGEON:  Surgeon(s) and Role:    * Carole Civil, MD - Primary  PHYSICIAN ASSISTANT: Simonne Maffucci  ANESTHESIA:   general  EBL:  Total I/O In: 100 [I.V.:100] Out: -   BLOOD ADMINISTERED:none  DRAINS: none   LOCAL MEDICATIONS USED:  MARCAINE   , Amount: 60 ml and OTHER with epinephrine  SPECIMEN:  No Specimen  DISPOSITION OF SPECIMEN:  N/A  COUNTS:  YES  TOURNIQUET:  * No tourniquets in log *  DICTATION: .Dragon Dictation  PLAN OF CARE: Admit for overnight observation  PATIENT DISPOSITION:  PACU - hemodynamically stable.   Delay start of Pharmacological VTE agent (>24hrs) due to surgical blood loss or risk of bleeding: yes

## 2015-05-30 NOTE — Anesthesia Preprocedure Evaluation (Signed)
Anesthesia Evaluation  Patient identified by MRN, date of birth, ID band Patient awake    Reviewed: Allergy & Precautions, NPO status , Patient's Chart, lab work & pertinent test results, reviewed documented beta blocker date and time   Airway Mallampati: II  TM Distance: >3 FB Neck ROM: Full    Dental  (+) Upper Dentures, Edentulous Upper, Edentulous Lower   Pulmonary COPD, former smoker,    Pulmonary exam normal        Cardiovascular hypertension, Pt. on medications + angina with exertion + CAD and + Cardiac Stents  + dysrhythmias Atrial Fibrillation + pacemaker III Rhythm:Irregular     Neuro/Psych Anxiety  Neuromuscular disease    GI/Hepatic hiatal hernia, GERD  Medicated and Controlled,  Endo/Other    Renal/GU      Musculoskeletal   Abdominal Normal abdominal exam  (+)   Peds  Hematology  (+) anemia ,   Anesthesia Other Findings   Reproductive/Obstetrics                             Anesthesia Physical Anesthesia Plan  ASA: III  Anesthesia Plan: General   Post-op Pain Management:    Induction: Intravenous  Airway Management Planned: Oral ETT  Additional Equipment:   Intra-op Plan:   Post-operative Plan: Extubation in OR  Informed Consent: I have reviewed the patients History and Physical, chart, labs and discussed the procedure including the risks, benefits and alternatives for the proposed anesthesia with the patient or authorized representative who has indicated his/her understanding and acceptance.     Plan Discussed with: CRNA  Anesthesia Plan Comments:         Anesthesia Quick Evaluation

## 2015-05-30 NOTE — Evaluation (Addendum)
Occupational Therapy Evaluation Patient Details Name: [name] MRN: [id number] DOB: 1935/12/05 Today's Date: 05/30/2015    History of Present Illness Pt is a 79 y/o female who had a successful rotator cuff repair a few years ago and now presents with pain after she fell on her right shoulder last year. After the fall she knows that she could move her arm as well especially with overhead activity and she had increasing shoulder pain for one year. Mild weakness as well and peri-acromial pain no neck pain.   Clinical Impression   Pt awake and alert this afternoon, daughter in room with pt. Pt reports her RUE is painful when she moves and she continues to feel "groggy." Pt has been through open RCR before and is familiar with protocol. Discussed surgery and precautions in detail with pt, as daughter reports she was non-compliant with protocol during after previous RCR. Pt declined to change gowns this afternoon when offered. Educated pt on precautions and importance of adhering to precautions, as well as approximate timeline for shoulder healing and rehabilitation. Per chart review, pt is expected to discharge home tomorrow, 10/27. Recommend HHOT when approved by MD, progressing to outpatient OT as pt is able to obtain transportation. Daughter will be with pt to assist in ADLs before and after school.      Follow Up Recommendations  Home health OT;Supervision - Intermittent    Equipment Recommendations  None recommended by OT       Precautions / Restrictions Precautions Precautions: Shoulder Type of Shoulder Precautions: NWB, no P/ROM, no A/ROM. Sling at all times, with exception of ADL tasks (dressing/bathing) Shoulder Interventions: Shoulder sling/immobilizer;At all times;Off for dressing/bathing/exercises Required Braces or Orthoses: Sling Restrictions Weight Bearing Restrictions: Yes (NWB)      Mobility Bed Mobility               General bed mobility comments: not tested  this session, will further evaluate in am  Transfers                 General transfer comment: not tested this session, will further evaluate in am         ADL Overall ADL's : Needs assistance/impaired                                       General ADL Comments: ADL completion not tested this session due to pain and pt feeling groggy. Pt RUE is in sling, with orders for no P/ROM or A/ROM. Pt will require assistance with ADL tasks, daughter is present to assist when not in school.      Vision Vision Assessment?: No apparent visual deficits          Pertinent Vitals/Pain Pain Assessment: Faces Faces Pain Scale: Hurts even more Pain Location: right shoulder Pain Descriptors / Indicators: Aching Pain Intervention(s): Limited activity within patient's tolerance;Monitored during session     Hand Dominance Right   Extremity/Trunk Assessment Upper Extremity Assessment Upper Extremity Assessment: RUE deficits/detail RUE Deficits / Details: RUE restrictions-no shoulder ROM, sling at all times RUE: Unable to fully assess due to immobilization           Communication Communication Communication: No difficulties   Cognition Arousal/Alertness: Awake/alert Behavior During Therapy: WFL for tasks assessed/performed Overall Cognitive Status: Within Functional Limits for tasks assessed  Home Living Family/patient expects to be discharged to:: Private residence Living Arrangements: Children Available Help at Discharge: Family;Available PRN/intermittently Type of Home: House                 Bathroom Toilet: Standard     Home Equipment: None          Prior Functioning/Environment Level of Independence: Independent             OT Diagnosis: Generalized weakness;Acute pain   OT Problem List: Decreased strength;Decreased range of motion;Decreased knowledge of precautions;Impaired UE functional  use;Pain   OT Treatment/Interventions:      OT Goals(Current goals can be found in the care plan section) Acute Rehab OT Goals Patient Stated Goal: to go home and do my work  OT Frequency:      End of Session    Activity Tolerance: Patient limited by pain Patient left: in bed;with call bell/phone within reach;with bed alarm set;with family/visitor present   Time: 3154-0086 OT Time Calculation (min): 28 min Charges:  OT General Charges $OT Visit: 1 Procedure OT Evaluation $Initial OT Evaluation Tier I: 1 Procedure G-Codes: Clinical judgement Self-care: Current: At least 80% but less than 100% impaired, limited, or restricted.  Self-care: Goal: At least 80% but less than 100% impaired, limited, or restricted.  Self-care: Discharge: At least 80% but less than 100% impaired, limited, or restricted.   Guadelupe Sabin, OTR/L  [phone]  05/30/2015, 4:32 PM

## 2015-05-30 NOTE — Transfer of Care (Signed)
Immediate Anesthesia Transfer of Care Note  Patient: [name]  Procedure(s) Performed: Procedure(s): OPEN ROTATOR CUFF REPAIR RIGHT SHOULDER (Right) RIGHT SHOULDER ACROMIOPLASTY (Right)  Patient Location: PACU  Anesthesia Type:General  Level of Consciousness: awake and patient cooperative  Airway & Oxygen Therapy: Patient Spontanous Breathing and Patient connected to face mask oxygen  Post-op Assessment: Report given to RN, Post -op Vital signs reviewed and stable and Patient moving all extremities  Post vital signs: Reviewed and stable  Last Vitals:  Filed Vitals:   05/30/15 0825  BP: 151/85  Resp: 13    Complications: No apparent anesthesia complications

## 2015-05-31 ENCOUNTER — Ambulatory Visit: Payer: Self-pay | Admitting: Orthopedic Surgery

## 2015-05-31 ENCOUNTER — Encounter (HOSPITAL_COMMUNITY): Payer: Self-pay | Admitting: Orthopedic Surgery

## 2015-05-31 DIAGNOSIS — Z87891 Personal history of nicotine dependence: Secondary | ICD-10-CM | POA: Diagnosis not present

## 2015-05-31 DIAGNOSIS — Z9181 History of falling: Secondary | ICD-10-CM | POA: Diagnosis not present

## 2015-05-31 DIAGNOSIS — Z888 Allergy status to other drugs, medicaments and biological substances status: Secondary | ICD-10-CM | POA: Diagnosis not present

## 2015-05-31 DIAGNOSIS — Z950 Presence of cardiac pacemaker: Secondary | ICD-10-CM | POA: Diagnosis not present

## 2015-05-31 DIAGNOSIS — E785 Hyperlipidemia, unspecified: Secondary | ICD-10-CM | POA: Diagnosis not present

## 2015-05-31 DIAGNOSIS — F419 Anxiety disorder, unspecified: Secondary | ICD-10-CM | POA: Diagnosis not present

## 2015-05-31 DIAGNOSIS — I241 Dressler's syndrome: Secondary | ICD-10-CM | POA: Diagnosis not present

## 2015-05-31 DIAGNOSIS — I4891 Unspecified atrial fibrillation: Secondary | ICD-10-CM | POA: Diagnosis not present

## 2015-05-31 DIAGNOSIS — K219 Gastro-esophageal reflux disease without esophagitis: Secondary | ICD-10-CM | POA: Diagnosis not present

## 2015-05-31 DIAGNOSIS — Z79899 Other long term (current) drug therapy: Secondary | ICD-10-CM | POA: Diagnosis not present

## 2015-05-31 DIAGNOSIS — Z7982 Long term (current) use of aspirin: Secondary | ICD-10-CM | POA: Diagnosis not present

## 2015-05-31 DIAGNOSIS — G252 Other specified forms of tremor: Secondary | ICD-10-CM | POA: Diagnosis not present

## 2015-05-31 DIAGNOSIS — I495 Sick sinus syndrome: Secondary | ICD-10-CM | POA: Diagnosis not present

## 2015-05-31 DIAGNOSIS — M75111 Incomplete rotator cuff tear or rupture of right shoulder, not specified as traumatic: Secondary | ICD-10-CM | POA: Diagnosis not present

## 2015-05-31 DIAGNOSIS — I2510 Atherosclerotic heart disease of native coronary artery without angina pectoris: Secondary | ICD-10-CM | POA: Diagnosis not present

## 2015-05-31 DIAGNOSIS — Z882 Allergy status to sulfonamides status: Secondary | ICD-10-CM | POA: Diagnosis not present

## 2015-05-31 DIAGNOSIS — M549 Dorsalgia, unspecified: Secondary | ICD-10-CM | POA: Diagnosis not present

## 2015-05-31 DIAGNOSIS — G8929 Other chronic pain: Secondary | ICD-10-CM | POA: Diagnosis not present

## 2015-05-31 DIAGNOSIS — I10 Essential (primary) hypertension: Secondary | ICD-10-CM | POA: Diagnosis not present

## 2015-05-31 DIAGNOSIS — K449 Diaphragmatic hernia without obstruction or gangrene: Secondary | ICD-10-CM | POA: Diagnosis not present

## 2015-05-31 DIAGNOSIS — J449 Chronic obstructive pulmonary disease, unspecified: Secondary | ICD-10-CM | POA: Diagnosis not present

## 2015-05-31 MED ORDER — HYDROCODONE-ACETAMINOPHEN 7.5-325 MG PO TABS: 1.0000 | ORAL_TABLET | Freq: Four times a day (QID) | ORAL | Status: DC

## 2015-05-31 NOTE — Clinical Social Work Note (Signed)
CSW received consult for possible SNF. Plan is for pt to return home. CSW will sign off, but can be reconsulted if needed.  Benay Pike, [place]

## 2015-05-31 NOTE — Discharge Summary (Signed)
Physician Discharge Summary  Patient ID: [name] MRN: [id number] DOB/AGE: [birth year] 79 y.o.  Admit date: 05/30/2015 Discharge date: 05/31/2015  Admission Diagnoses: Recurrent tear right rotator cuff  Discharge Diagnoses: Same Active Problems:   Rotator cuff tear   Discharged Condition: stable  Hospital Course: The patient was admitted on 05/30/2015 she underwent an open rotator cuff repair with one suture anchor and she also had acromioplasty. Although she did tolerate this well to control pain and assist her with balance gait and walking she was kept extended stay.  Postop day 1 she was afebrile vital signs were stable pain was controlled and she was cleared for discharge       Discharge Exam: Blood pressure 105/57, pulse 61, temperature 98.6 F (37 C), temperature source Oral, resp. rate 16, height 5\' 4"  (1.626 m), weight 163 lb (73.936 kg), SpO2 96 %. She is awake alert and oriented. Her dressing is clean dry and intact the neurovascular motor function of her right upper extremity is normal in the hand wrist and elbow shoulder deferred because of pain surgery etc.  Disposition: 01-Home or Self Care  Discharge Instructions    Diet - low sodium heart healthy    Complete by:  As directed      Discharge instructions    Complete by:  As directed   Wear the immobilizer at all times except when changing her close. Overall close to the body when changing her close. You can remove the brace/immobilizer to bathe. Just keep the arm close to the thigh and slide the wash cloth into the axilla     Increase activity slowly    Complete by:  As directed      Leave dressing on - Keep it clean, dry, and intact until clinic visit    Complete by:  As directed   If the dressing become significantly soiled you may change it using 4 x 4 gauze and tape            Medication List    TAKE these medications        ALPRAZolam 1 MG tablet  Commonly known as:  XANAX  Take by mouth at  bedtime. 1/2 tab at night     amiodarone 100 MG tablet  Commonly known as:  PACERONE  Take 100 mg by mouth daily.     aspirin 81 MG tablet  Take 81 mg by mouth daily.     B-12 PO  Take 1 tablet by mouth daily as needed (for supplement).     clopidogrel 75 MG tablet  Commonly known as:  PLAVIX  Take 75 mg by mouth.     esomeprazole 40 MG capsule  Commonly known as:  NEXIUM  Take 1 po BID x 2 weeks then once a day     furosemide 40 MG tablet  Commonly known as:  LASIX  Take 40-60 mg by mouth daily as needed (for swelling).     HYDROcodone-acetaminophen 7.5-325 MG tablet  Commonly known as:  NORCO  Take 1 tablet by mouth every 6 (six) hours.     metoprolol succinate 50 MG 24 hr tablet  Commonly known as:  TOPROL-XL  Take 75 mg by mouth every morning.     NITROSTAT 0.4 MG SL tablet  Generic drug:  nitroGLYCERIN  Place 0.4 mg under the tongue every 5 (five) minutes as needed for chest pain.     potassium chloride SA 20 MEQ tablet  Commonly known as:  K-DUR,KLOR-CON  Take 20-40 mEq by mouth daily as needed (Takes 49mEq daily, but increases to 39mEq as needed for cramping.).     primidone 50 MG tablet  Commonly known as:  MYSOLINE  Take 50 mg by mouth 2 (two) times daily as needed (for tremors).     simvastatin 5 MG tablet  Commonly known as:  ZOCOR  Take 5 mg by mouth daily.     thiamine 100 MG tablet  Commonly known as:  VITAMIN B-1  Take 100 mg by mouth daily.     valsartan 40 MG tablet  Commonly known as:  DIOVAN  Take 40 mg by mouth every evening.     VITAMIN D (CHOLECALCIFEROL) PO  Take 1 capsule by mouth daily.     vitamin E 400 UNIT capsule  Take 400 Units by mouth daily.           Follow-up Information    Follow up with Arther Abbott, MD.   Specialties:  Orthopedic Surgery, Radiology   Contact information:   [address] [place] [address] [phone]       Signed: Arther Abbott 05/31/2015, 7:36 AM

## 2015-05-31 NOTE — Progress Notes (Signed)
Orthopedic progress note  Patient was Extended stay in order to maintain adequate pain control and ensure safety in terms of gait and balance as she has a bad left hip and lower back.

## 2015-05-31 NOTE — Care Management Note (Signed)
Case Management Note  Patient Details  Name: [name] MRN: [id number] Date of Birth: 1936/01/20  Subjective/Objective:                  Pt is from home, ind with strong family support. Pt s/p rotator cuff repair.   Action/Plan: Pt plans to return home with self care today.  Per MD pt is not to receive any therapy until she is seen for her f/u appointment. Family present and pt will have needed support at home. Pt has no DME needs.   Expected Discharge Date:  06/04/15               Expected Discharge Plan:  Home/Self Care  In-House Referral:  NA  Discharge planning Services  CM Consult  Post Acute Care Choice:  NA Choice offered to:  NA  DME Arranged:    DME Agency:     HH Arranged:    HH Agency:     Status of Service:  Completed, signed off  Medicare Important Message Given:    Date Medicare IM Given:    Medicare IM give by:    Date Additional Medicare IM Given:    Additional Medicare Important Message give by:     If discussed at [place] of Stay Meetings, dates discussed:    Additional Comments:  Sherald Barge, RN 05/31/2015, 12:56 PM

## 2015-05-31 NOTE — Progress Notes (Signed)
Patient was discharged with instructions given on medications,and follow up visits,patient,and family verbalized understanding. Prescriptions sent with patient Accompanied by staff to an awaiting vehicle.

## 2015-06-04 ENCOUNTER — Ambulatory Visit (INDEPENDENT_AMBULATORY_CARE_PROVIDER_SITE_OTHER): Payer: Self-pay | Admitting: Orthopedic Surgery

## 2015-06-04 ENCOUNTER — Encounter: Payer: Self-pay | Admitting: Orthopedic Surgery

## 2015-06-04 VITALS — Ht 64.0 in | Wt 163.0 lb

## 2015-06-04 DIAGNOSIS — Z4789 Encounter for other orthopedic aftercare: Secondary | ICD-10-CM

## 2015-06-04 MED ORDER — METHOCARBAMOL 500 MG PO TABS: 500.0000 mg | ORAL_TABLET | Freq: Four times a day (QID) | ORAL | Status: DC | PRN

## 2015-06-04 MED ORDER — HYDROCODONE-ACETAMINOPHEN 7.5-325 MG PO TABS: 1.0000 | ORAL_TABLET | Freq: Four times a day (QID) | ORAL | Status: DC

## 2015-06-04 MED ORDER — PROMETHAZINE HCL 12.5 MG PO TABS: 12.5000 mg | ORAL_TABLET | Freq: Four times a day (QID) | ORAL | Status: DC | PRN

## 2015-06-04 NOTE — Progress Notes (Signed)
Patient ID: [name], female   DOB: [birth year], 79 y.o.   MRN: [id number]  Follow up visit  Chief Complaint  Patient presents with  . Follow-up    post op 1, Rt RCR, DOS 05/30/15    Ht 5\' 4"  (1.626 m)  Wt 163 lb (73.936 kg)  BMI 27.97 kg/m2  Encounter Diagnosis  Name Primary?  . Surgical aftercare, musculoskeletal system Yes    Status post repeat rotator cuff repair after traumatic injury to the right shoulder.. Patient complains of nausea and back pain  Her suture line looks good in terms of the staple line  The patient will return to get the staples out and then we can start home therapy through [facility].  Continue sling and swathe. Address nausea with Phenergan. Repeat or refill medication with Norco 7.5 every 6  Start Robaxin 500 every 6 for back pain  Return 1 week staples out start therapy at home

## 2015-06-08 DIAGNOSIS — H04552 Acquired stenosis of left nasolacrimal duct: Secondary | ICD-10-CM | POA: Diagnosis not present

## 2015-06-08 DIAGNOSIS — T1581XS Foreign body in other and multiple parts of external eye, right eye, sequela: Secondary | ICD-10-CM | POA: Diagnosis not present

## 2015-06-08 DIAGNOSIS — Z4689 Encounter for fitting and adjustment of other specified devices: Secondary | ICD-10-CM | POA: Diagnosis not present

## 2015-06-08 DIAGNOSIS — H04553 Acquired stenosis of bilateral nasolacrimal duct: Secondary | ICD-10-CM | POA: Diagnosis not present

## 2015-06-08 DIAGNOSIS — H04551 Acquired stenosis of right nasolacrimal duct: Secondary | ICD-10-CM | POA: Diagnosis not present

## 2015-06-10 DIAGNOSIS — I509 Heart failure, unspecified: Secondary | ICD-10-CM | POA: Diagnosis not present

## 2015-06-14 ENCOUNTER — Encounter: Payer: Self-pay | Admitting: Orthopedic Surgery

## 2015-06-14 ENCOUNTER — Ambulatory Visit (INDEPENDENT_AMBULATORY_CARE_PROVIDER_SITE_OTHER): Payer: Self-pay | Admitting: Orthopedic Surgery

## 2015-06-14 VITALS — BP 171/99 | Ht 64.0 in | Wt 163.0 lb

## 2015-06-14 DIAGNOSIS — Z4789 Encounter for other orthopedic aftercare: Secondary | ICD-10-CM

## 2015-06-14 NOTE — Progress Notes (Signed)
Postop visit #2 status post recurrent rotator cuff tear with repair on 05/30/2015 staples are removed today wound is clean dry and intact it was an open repair she's doing well she is in a shoulder immobilizer and she will do her own physical therapy program follow-up in 4 weeks

## 2015-06-14 NOTE — Patient Instructions (Signed)
HOME EXERCISE PROGRAM

## 2015-07-03 DIAGNOSIS — M47816 Spondylosis without myelopathy or radiculopathy, lumbar region: Secondary | ICD-10-CM | POA: Diagnosis not present

## 2015-07-03 DIAGNOSIS — M545 Low back pain: Secondary | ICD-10-CM | POA: Diagnosis not present

## 2015-07-09 ENCOUNTER — Ambulatory Visit: Payer: Self-pay | Admitting: Orthopedic Surgery

## 2015-07-10 DIAGNOSIS — I509 Heart failure, unspecified: Secondary | ICD-10-CM | POA: Diagnosis not present

## 2015-07-12 ENCOUNTER — Encounter: Payer: Self-pay | Admitting: Orthopedic Surgery

## 2015-07-12 ENCOUNTER — Ambulatory Visit (INDEPENDENT_AMBULATORY_CARE_PROVIDER_SITE_OTHER): Payer: Self-pay | Admitting: Orthopedic Surgery

## 2015-07-12 VITALS — BP 160/86 | Ht 64.0 in | Wt 163.0 lb

## 2015-07-12 DIAGNOSIS — Z4789 Encounter for other orthopedic aftercare: Secondary | ICD-10-CM

## 2015-07-12 DIAGNOSIS — M75101 Unspecified rotator cuff tear or rupture of right shoulder, not specified as traumatic: Secondary | ICD-10-CM

## 2015-07-12 NOTE — Progress Notes (Signed)
Postop visit status post rotator cuff repair revision 05/30/2015  Patient does her own therapy  Patient says she is doing well with her shoulder she does want to be evaluated for her back so we'll set up an appointment for that in a month  As far as the shoulder goes the incision healed well no signs of erythema or infection her shoulder flexion is greater than 100 her cuff strength is improving her drop test is normal  This patient is in very good shape she is in stable condition to take care of herself. She is very sound mentally and I've known her for a very long time she is doing much better than a lot of patients that we see  When she comes back to take some lumbar spine films

## 2015-08-10 DIAGNOSIS — I509 Heart failure, unspecified: Secondary | ICD-10-CM | POA: Diagnosis not present

## 2015-08-14 ENCOUNTER — Ambulatory Visit: Payer: Medicare Other | Admitting: Orthopedic Surgery

## 2015-08-28 ENCOUNTER — Ambulatory Visit: Payer: Medicare Other | Admitting: Orthopedic Surgery

## 2015-09-04 ENCOUNTER — Ambulatory Visit (INDEPENDENT_AMBULATORY_CARE_PROVIDER_SITE_OTHER): Payer: Medicare Other | Admitting: Internal Medicine

## 2015-09-05 ENCOUNTER — Other Ambulatory Visit: Payer: Self-pay | Admitting: *Deleted

## 2015-09-05 MED ORDER — METHOCARBAMOL 500 MG PO TABS: 500.0000 mg | ORAL_TABLET | Freq: Four times a day (QID) | ORAL | Status: DC | PRN

## 2015-09-10 DIAGNOSIS — I509 Heart failure, unspecified: Secondary | ICD-10-CM | POA: Diagnosis not present

## 2015-09-21 ENCOUNTER — Ambulatory Visit: Payer: Medicare Other | Admitting: Orthopedic Surgery

## 2015-09-24 ENCOUNTER — Encounter: Payer: Self-pay | Admitting: *Deleted

## 2015-09-24 ENCOUNTER — Ambulatory Visit: Payer: Medicare Other | Admitting: Orthopedic Surgery

## 2015-09-26 DIAGNOSIS — I10 Essential (primary) hypertension: Secondary | ICD-10-CM | POA: Diagnosis not present

## 2015-09-26 DIAGNOSIS — R0602 Shortness of breath: Secondary | ICD-10-CM | POA: Diagnosis not present

## 2015-09-26 DIAGNOSIS — M25559 Pain in unspecified hip: Secondary | ICD-10-CM | POA: Diagnosis not present

## 2015-09-26 DIAGNOSIS — I480 Paroxysmal atrial fibrillation: Secondary | ICD-10-CM | POA: Diagnosis not present

## 2015-09-26 DIAGNOSIS — I2510 Atherosclerotic heart disease of native coronary artery without angina pectoris: Secondary | ICD-10-CM | POA: Diagnosis not present

## 2015-10-01 DIAGNOSIS — I10 Essential (primary) hypertension: Secondary | ICD-10-CM | POA: Diagnosis not present

## 2015-10-01 DIAGNOSIS — I2510 Atherosclerotic heart disease of native coronary artery without angina pectoris: Secondary | ICD-10-CM | POA: Diagnosis not present

## 2015-10-01 DIAGNOSIS — E782 Mixed hyperlipidemia: Secondary | ICD-10-CM | POA: Diagnosis not present

## 2015-10-01 DIAGNOSIS — D519 Vitamin B12 deficiency anemia, unspecified: Secondary | ICD-10-CM | POA: Diagnosis not present

## 2015-10-01 DIAGNOSIS — I480 Paroxysmal atrial fibrillation: Secondary | ICD-10-CM | POA: Diagnosis not present

## 2015-10-03 ENCOUNTER — Encounter: Payer: Self-pay | Admitting: *Deleted

## 2015-10-08 DIAGNOSIS — I509 Heart failure, unspecified: Secondary | ICD-10-CM | POA: Diagnosis not present

## 2015-10-12 DIAGNOSIS — E782 Mixed hyperlipidemia: Secondary | ICD-10-CM | POA: Diagnosis not present

## 2015-10-12 DIAGNOSIS — I10 Essential (primary) hypertension: Secondary | ICD-10-CM | POA: Diagnosis not present

## 2015-10-12 DIAGNOSIS — M545 Low back pain: Secondary | ICD-10-CM | POA: Diagnosis not present

## 2015-10-12 DIAGNOSIS — I480 Paroxysmal atrial fibrillation: Secondary | ICD-10-CM | POA: Diagnosis not present

## 2015-10-12 DIAGNOSIS — I2510 Atherosclerotic heart disease of native coronary artery without angina pectoris: Secondary | ICD-10-CM | POA: Diagnosis not present

## 2015-10-15 ENCOUNTER — Ambulatory Visit (INDEPENDENT_AMBULATORY_CARE_PROVIDER_SITE_OTHER): Payer: Medicare Other | Admitting: Orthopedic Surgery

## 2015-10-15 ENCOUNTER — Ambulatory Visit (INDEPENDENT_AMBULATORY_CARE_PROVIDER_SITE_OTHER): Payer: Medicare Other

## 2015-10-15 VITALS — Ht 64.0 in

## 2015-10-15 DIAGNOSIS — M4806 Spinal stenosis, lumbar region: Secondary | ICD-10-CM

## 2015-10-15 DIAGNOSIS — M47816 Spondylosis without myelopathy or radiculopathy, lumbar region: Secondary | ICD-10-CM | POA: Diagnosis not present

## 2015-10-15 DIAGNOSIS — M545 Low back pain: Secondary | ICD-10-CM | POA: Diagnosis not present

## 2015-10-15 DIAGNOSIS — M48061 Spinal stenosis, lumbar region without neurogenic claudication: Secondary | ICD-10-CM

## 2015-10-15 DIAGNOSIS — M4126 Other idiopathic scoliosis, lumbar region: Secondary | ICD-10-CM | POA: Diagnosis not present

## 2015-10-15 DIAGNOSIS — M419 Scoliosis, unspecified: Secondary | ICD-10-CM

## 2015-10-15 NOTE — Patient Instructions (Addendum)
Call [facility] therapy dept to schedule therapy visits- [phone]

## 2015-10-15 NOTE — Progress Notes (Signed)
Patient ID: [name], female   DOB: 1935-08-08, 80 y.o.   MRN: [id number]  Chief complaint lower back pain  HPI [name] is a 80 y.o. female.  She is here today for evaluation of lower back pain which she's had for several years. She is a bilateral lower back pain bilateral leg pain weakness both legs and left leg radicular pain down into the foot. She reports that she can stand and wash dishes has to sit down. Can't shop in a leaning forward over such as a grocery cart.  Review of Systems Review of Systems  Constitutional: Negative for fever and unexpected weight change.  Musculoskeletal: Positive for myalgias, back pain and arthralgias.  Neurological: Positive for numbness. Negative for weakness.       Toes numb both feet    Past Medical History  Diagnosis Date  . Atrial fibrillation   . Coronary atherosclerosis of native coronary artery     DES x 2 to RCA 10/10  . GERD (gastroesophageal reflux disease)   . Tachycardia-bradycardia syndrome ([place])     s/p Medtronic Adapta L dual chamber device  5/10  . Hyperlipidemia   . COPD (chronic obstructive pulmonary disease) ([place])   . Anxiety   . Resting tremor   . 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 Chronic back pain   . Pericardial effusion     Hemorrhagic   . Essential hypertension, benign   . Headache(784.0)   . H/O hiatal hernia   . Neuromuscular disorder ([place])     Tremors  . HOH (hard of hearing)   . Presence of permanent cardiac pacemaker   . Anemia     history    Past Surgical History  Procedure Laterality Date  . Cholecystectomy    . Appendectomy    . Subxiphoid pericardial window  11/10  . Esophagogastroduodenoscopy with esophageal dilation  2004, 2006, 2007  . Vaginal hysterectomy    . Colonoscopy  2011  . Right rotator cuff repair    . Insert / replace / remove pacemaker    . Left heart catheterization with coronary angiogram N/A 11/17/2011    Procedure: LEFT HEART  CATHETERIZATION WITH CORONARY ANGIOGRAM;  Surgeon: Sherren Mocha, MD;  Location: [facility] CATH LAB;  Service: Cardiovascular;  Laterality: N/A;  . Left heart catheterization with coronary angiogram N/A 09/26/2014    Procedure: LEFT HEART CATHETERIZATION WITH CORONARY ANGIOGRAM;  Surgeon: Leonie Man, MD;  Location: [facility] CATH LAB;  Service: Cardiovascular;  Laterality: N/A;  . Colonoscopy N/A 10/19/2014    Procedure: COLONOSCOPY;  Surgeon: Rogene Houston, MD;  Location: [facility] ENDO SUITE;  Service: Endoscopy;  Laterality: N/A;  1030  . Esophagogastroduodenoscopy N/A 10/26/2014    Procedure: ESOPHAGOGASTRODUODENOSCOPY (EGD);  Surgeon: Rogene Houston, MD;  Location: [facility] ENDO SUITE;  Service: Endoscopy;  Laterality: N/A;  230 - Dr. has lunch and learn  . Givens capsule study N/A 10/31/2014    Procedure: GIVENS CAPSULE STUDY;  Surgeon: Rogene Houston, MD;  Location: [facility] ENDO SUITE;  Service: Endoscopy;  Laterality: N/A;  730 -- pacemaker--needs monitoring--outpatient bed  . Cataract extraction    . Cardiac catheterization      stents x2.  . Shoulder open rotator cuff repair Right 05/30/2015    Procedure: OPEN ROTATOR CUFF REPAIR RIGHT SHOULDER;  Surgeon: Carole Civil, MD;  Location: [facility] ORS;  Service: Orthopedics;  Laterality: Right;  . Shoulder acromioplasty Right 05/30/2015    Procedure:  RIGHT SHOULDER ACROMIOPLASTY;  Surgeon: Carole Civil, MD;  Location: [facility] ORS;  Service: Orthopedics;  Laterality: Right;    Family History  Problem Relation Age of Onset  . Cancer Mother     Colon   . Coronary artery disease Sister   . Coronary artery disease Brother   . Arthritis    . Lung disease    . Asthma      Social History Social History  Substance Use Topics  . Smoking status: Former Smoker -- 2.00 packs/day for 10 years    Types: Cigarettes    Quit date: 05/23/1989  . Smokeless tobacco: Never Used  . Alcohol Use: No    Allergies  Allergen Reactions  . Amitriptyline Hcl Other (See  Comments)    Caused "jaws to twist and lock"  . Sulfonamide Derivatives Other (See Comments)    UNKNOWN REACTION    Current Outpatient Prescriptions  Medication Sig Dispense Refill  . ALPRAZolam (XANAX) 1 MG tablet Take by mouth at bedtime. 1/2 tab at night    . amiodarone (PACERONE) 100 MG tablet Take 100 mg by mouth daily.    Marland Kitchen aspirin 81 MG tablet Take 81 mg by mouth daily.    . clopidogrel (PLAVIX) 75 MG tablet Take 75 mg by mouth.     . Cyanocobalamin (B-12 PO) Take 1 tablet by mouth daily as needed (for supplement).    Marland Kitchen esomeprazole (NEXIUM) 40 MG capsule Take 1 po BID x 2 weeks then once a day (Patient taking differently: Take 40 mg by mouth 2 (two) times daily before a meal. ) 60 capsule 0  . furosemide (LASIX) 40 MG tablet Take 40-60 mg by mouth daily as needed (for swelling).     Marland Kitchen HYDROcodone-acetaminophen (NORCO) 7.5-325 MG tablet Take 1 tablet by mouth every 6 (six) hours. 42 tablet 0  . methocarbamol (ROBAXIN) 500 MG tablet Take 1 tablet (500 mg total) by mouth every 6 (six) hours as needed for muscle spasms. 35 tablet 5  . metoprolol (TOPROL-XL) 50 MG 24 hr tablet Take 75 mg by mouth every morning.     . nitroGLYCERIN (NITROSTAT) 0.4 MG SL tablet Place 0.4 mg under the tongue every 5 (five) minutes as needed for chest pain.     . potassium chloride SA (K-DUR,KLOR-CON) 20 MEQ tablet Take 20-40 mEq by mouth daily as needed (Takes 82mEq daily, but increases to 13mEq as needed for cramping.).     Marland Kitchen primidone (MYSOLINE) 50 MG tablet Take 50 mg by mouth 2 (two) times daily as needed (for tremors).     . promethazine (PHENERGAN) 12.5 MG tablet Take 1 tablet (12.5 mg total) by mouth every 6 (six) hours as needed for nausea or vomiting. 60 tablet 1  . simvastatin (ZOCOR) 5 MG tablet Take 5 mg by mouth daily.    Marland Kitchen thiamine (VITAMIN B-1) 100 MG tablet Take 100 mg by mouth daily.    . valsartan (DIOVAN) 40 MG tablet Take 40 mg by mouth every evening.     Marland Kitchen VITAMIN D, CHOLECALCIFEROL, PO  Take 1 capsule by mouth daily.     . vitamin E 400 UNIT capsule Take 400 Units by mouth daily.     No current facility-administered medications for this visit.       Physical Exam Physical Exam There were no vitals taken for this visit. Appearance, there are no abnormalities in terms of appearance the patient was well-developed and well-nourished. The grooming and hygiene were normal.  Mental  status orientation, there was normal alertness and orientation Mood pleasant Ambulatory status altered gait related to age   Examination of the lumbar spine reveal tenderness at the L4-5 disc space and also on the left and right left more than right lower back  Decreased spinal flexion extension and rotation were noted.  The lower extremities exhibited normal dorsiflexion plantar flexion extension flexion of the knee as well as hip flexion. No atrophy was noted. The skin overlying the lumbar thoracic and cervical regions was warm dry and intact without laceration. The lower extremities also had normal skin.  Neurologic examination  Reflexes were 2+ and equal  Sensation was normal in both feet and legs  Babinski's tests were down going  Straight leg raise testing was normal bilaterally  The vascular examination revealed normal dorsalis pedis pulses in both feet and both feet were warm with good capillary refill   Data Reviewed X-rays today show degenerative scoliosis spondylosis L-spine  Assessment  Lumbar spondylosis, degenerative scoliosis, degenerative disc disease L-spine, spinal stenosis   Plan  Physical therapy

## 2015-10-16 ENCOUNTER — Telehealth: Payer: Self-pay | Admitting: Orthopedic Surgery

## 2015-10-16 NOTE — Telephone Encounter (Signed)
Patient called back today following her office visit yesterday, 10/15/15 - states understood that Dr Aline Brochure was to prescribe a muscle relaxer for her?  States she uses [employer].  Ph# [phone] or cell [phone]

## 2015-10-17 NOTE — Telephone Encounter (Signed)
i did not see any mention of a muscle relaxer in the note, was this discussed?

## 2015-10-18 ENCOUNTER — Other Ambulatory Visit: Payer: Self-pay | Admitting: Orthopedic Surgery

## 2015-10-18 MED ORDER — METHOCARBAMOL 500 MG PO TABS: 500.0000 mg | ORAL_TABLET | Freq: Four times a day (QID) | ORAL | Status: DC | PRN

## 2015-10-18 NOTE — Telephone Encounter (Signed)
Pick up from pharmacy

## 2015-10-18 NOTE — Telephone Encounter (Signed)
Patient aware.

## 2015-10-31 ENCOUNTER — Encounter: Payer: Self-pay | Admitting: Cardiology

## 2015-10-31 ENCOUNTER — Ambulatory Visit (INDEPENDENT_AMBULATORY_CARE_PROVIDER_SITE_OTHER): Payer: Medicare Other | Admitting: Cardiology

## 2015-10-31 VITALS — BP 120/72 | HR 62 | Ht 66.0 in | Wt 154.0 lb

## 2015-10-31 DIAGNOSIS — I480 Paroxysmal atrial fibrillation: Secondary | ICD-10-CM | POA: Diagnosis not present

## 2015-10-31 DIAGNOSIS — I2510 Atherosclerotic heart disease of native coronary artery without angina pectoris: Secondary | ICD-10-CM | POA: Diagnosis not present

## 2015-10-31 DIAGNOSIS — I495 Sick sinus syndrome: Secondary | ICD-10-CM

## 2015-10-31 NOTE — Progress Notes (Signed)
Cardiology Office Note  Date: 10/31/2015   ID: CARYOL SHANE, DOB Oct 18, 1935, MRN [id number]  PCP: Wende Neighbors, MD  Primary Cardiologist: Rozann Lesches, MD   Chief Complaint  Patient presents with  . Coronary Artery Disease    History of Present Illness: [name] is a 80 y.o. female last seen in September 2016. She is here today with her daughter for a follow-up visit. She describes intermittent shortness of breath as well as a feeling of a pause in heartbeat. No dizziness or syncope. She reports compliance with her medications otherwise follows with Dr. Nevada Crane for primary care. She had lab work done recently which we are requesting for review.  She does not report any definite angina symptoms or nitroglycerin use. Current medications include aspirin, amiodarone, Plavix, Lasix, Toprol-XL, potassium, Diovan, and Zocor.  Cardiac catheterization from last year is outlined below.  Past Medical History  Diagnosis Date  . Atrial fibrillation   . Coronary atherosclerosis of native coronary artery     DES x 2 to RCA 10/10  . GERD (gastroesophageal reflux disease)   . Tachycardia-bradycardia syndrome ([place])     s/p Medtronic Adapta L dual chamber device  5/10  . Hyperlipidemia   . COPD (chronic obstructive pulmonary disease) ([place])   . Anxiety   . Resting tremor   . 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 Chronic back pain   . Pericardial effusion     Hemorrhagic   . Essential hypertension, benign   . Headache(784.0)   . H/O hiatal hernia   . Neuromuscular disorder ([place])     Tremors  . HOH (hard of hearing)   . Presence of permanent cardiac pacemaker   . Anemia     history    Past Surgical History  Procedure Laterality Date  . Cholecystectomy    . Appendectomy    . Subxiphoid pericardial window  11/10  . Esophagogastroduodenoscopy with esophageal dilation  2004, 2006, 2007  . Vaginal hysterectomy    . Colonoscopy  2011  . Right rotator cuff  repair    . Insert / replace / remove pacemaker    . Left heart catheterization with coronary angiogram N/A 11/17/2011    Procedure: LEFT HEART CATHETERIZATION WITH CORONARY ANGIOGRAM;  Surgeon: Sherren Mocha, MD;  Location: [facility] CATH LAB;  Service: Cardiovascular;  Laterality: N/A;  . Left heart catheterization with coronary angiogram N/A 09/26/2014    Procedure: LEFT HEART CATHETERIZATION WITH CORONARY ANGIOGRAM;  Surgeon: Leonie Man, MD;  Location: [facility] CATH LAB;  Service: Cardiovascular;  Laterality: N/A;  . Colonoscopy N/A 10/19/2014    Procedure: COLONOSCOPY;  Surgeon: Rogene Houston, MD;  [facility] ENDO [facility];  Service: Endoscopy;  Laterality: N/A;  1030  . Esophagogastroduodenoscopy N/A 10/26/2014    Procedure: ESOPHAGOGASTRODUODENOSCOPY (EGD);  Surgeon: Rogene Houston, MD;  Location: [facility] ENDO SU[facility];  Service: Endoscopy;  Laterality: N/A;  230 - Dr. has lunch and learn  . Givens capsule study N/A 10/31/2014    Procedure: GIVENS CAPSULE STUDY;  Surgeon: Rogene Houston, MD;  Location: [facility] ENDO SUITE;  Service: Endoscopy;  Laterality: N/A;  730 -- pacemaker--needs monitoring--outpatient bed  . Cataract extraction    . Cardiac catheterization      stents x2.  . Shoulder open rotator cuff repair Right 05/30/2015    Procedure: OPEN ROTATOR CUFF REPAIR RIGHT SHOULDER;  Surgeon: Carole Civil, MD;  Location: [facility] ORS;  Service: Orthopedics;  Laterality:  Right;  . Shoulder acromioplasty Right 05/30/2015    Procedure: RIGHT SHOULDER ACROMIOPLASTY;  Surgeon: Carole Civil, MD;  Location: [facility] ORS;  Service: Orthopedics;  Laterality: Right;    Current Outpatient Prescriptions  Medication Sig Dispense Refill  . ALPRAZolam (XANAX) 1 MG tablet Take by mouth at bedtime. 1/2 tab at night    . amiodarone (PACERONE) 100 MG tablet Take 100 mg by mouth daily.    Marland Kitchen aspirin 81 MG tablet Take 81 mg by mouth daily.    . clopidogrel (PLAVIX) 75 MG tablet Take 75 mg by mouth.     . Cyanocobalamin  (B-12 PO) Take 1 tablet by mouth daily as needed (for supplement).    Marland Kitchen esomeprazole (NEXIUM) 40 MG capsule Take 1 po BID x 2 weeks then once a day (Patient taking differently: Take 40 mg by mouth 2 (two) times daily before a meal. ) 60 capsule 0  . furosemide (LASIX) 40 MG tablet Take 40-60 mg by mouth daily as needed (for swelling).     Marland Kitchen HYDROcodone-acetaminophen (NORCO) 7.5-325 MG tablet Take 1 tablet by mouth every 6 (six) hours. 42 tablet 0  . methocarbamol (ROBAXIN) 500 MG tablet Take 1 tablet (500 mg total) by mouth every 6 (six) hours as needed for muscle spasms. 56 tablet 5  . metoprolol (TOPROL-XL) 50 MG 24 hr tablet Take 75 mg by mouth every morning.     . nitroGLYCERIN (NITROSTAT) 0.4 MG SL tablet Place 0.4 mg under the tongue every 5 (five) minutes as needed for chest pain.     . potassium chloride SA (K-DUR,KLOR-CON) 20 MEQ tablet Take 20-40 mEq by mouth daily as needed (Takes 68mEq daily, but increases to 66mEq as needed for cramping.).     Marland Kitchen primidone (MYSOLINE) 50 MG tablet Take 50 mg by mouth 2 (two) times daily as needed (for tremors).     . promethazine (PHENERGAN) 12.5 MG tablet Take 1 tablet (12.5 mg total) by mouth every 6 (six) hours as needed for nausea or vomiting. 60 tablet 1  . simvastatin (ZOCOR) 5 MG tablet Take 5 mg by mouth daily.    Marland Kitchen thiamine (VITAMIN B-1) 100 MG tablet Take 100 mg by mouth daily.    . valsartan (DIOVAN) 40 MG tablet Take 40 mg by mouth every evening.     Marland Kitchen VITAMIN D, CHOLECALCIFEROL, PO Take 1 capsule by mouth daily.     . vitamin E 400 UNIT capsule Take 400 Units by mouth daily.     No current facility-administered medications for this visit.   Allergies:  Amitriptyline hcl and Sulfonamide derivatives   Social History: The patient  reports that she quit smoking about 26 years ago. Her smoking use included Cigarettes. She has a 20 pack-year smoking history. She has never used smokeless tobacco. She reports that she does not drink alcohol or use  illicit drugs.   ROS:  Please see the history of present illness. Otherwise, complete review of systems is positive for resting tremor.  All other systems are reviewed and negative.   Physical Exam: VS:  BP 120/72 mmHg  Pulse 62  Ht 5\' 6"  (1.676 m)  Wt 154 lb (69.854 kg)  BMI 24.87 kg/m2  SpO2 94%, BMI Body mass index is 24.87 kg/(m^2).  Wt Readings from Last 3 Encounters:  10/31/15 154 lb (69.854 kg)  07/12/15 163 lb (73.936 kg)  06/14/15 163 lb (73.936 kg)    Chronically ill-appearing woman, comfortable at rest.  HEENT: Conjunctiva and lids  are normal, oropharynx clear.  Neck: Supple, no JVD, no bruits.  Lungs: Clear to auscultation with diminished breath sounds mainly at bases.  Cardiac: Regular rate and rhythm without S3 gallop. No pericardial rub or knock.  Abdomen: Soft, nontender, bowel sounds present  Extremities: Trace edema.   ECG: I personally reviewed the prior tracing from 03/29/2015 which showed an atrial paced rhythm.  Recent Labwork: 03/05/2015: ALT 15; AST 23 05/24/2015: BUN 8; Creatinine, Ser 0.81; Hemoglobin 12.9; Platelets 209; Potassium 4.9; Sodium 139     Component Value Date/Time   CHOL 242* 11/15/2011 0640   TRIG 152* 11/15/2011 0640   HDL 46 11/15/2011 0640   CHOLHDL 5.3 11/15/2011 0640   VLDL 30 11/15/2011 0640   LDLCALC 166* 11/15/2011 0640    Other Studies Reviewed Today:  [facility] Cardiolite 08/25/2014: FINDINGS: Baseline ECG shows atrial paced rhythm at 60 beats per min. Lexiscan bolus was given in standard fashion. Heart rate increased from 60 beats per min up to 75 beats per min, and blood pressure increased from 127/76 up to 131/80. Patient tolerated infusion well. There were no diagnostic ST segment abnormalities noted. Rare PVC was seen.  Analysis of the overall perfusion data finds adequate myocardial radiotracer uptake.  Perfusion: There is a small, mild intensity, apical to basal inferolateral defect that exhibits  partial reversibility. This is most consistent with scar and mild peri-infarct ischemia.  Wall Motion: Normal left ventricular wall motion. No left ventricular dilation.  Left Ventricular Ejection Fraction: 56 %  End diastolic volume 55 ml  End systolic volume 24 ml  IMPRESSION: 1. Small region of scar with mild peri-infarct ischemia in the inferolateral wall.  2. Normal left ventricular wall motion.  3. Left ventricular ejection fraction 56%  4. Low-risk stress test findings*.  Cardiac catheterization 09/26/2014: FINDINGS:  Hemodynamics:   Central Aortic Pressure / Mean: 112/50/74 mmHg  Left Ventricular Pressure / LVEDP: 111/3/12 mmHg  Left Ventriculography:  EF: 55-60 %  Wall Motion: Overall normal wall motion with a somewhat globular appearing apex  Coronary Anatomy:  Dominance: Right  Left Main: Normal caliber vessel with moderate calcification. It bifurcates into the LAD And Circumflex. No significant stenoses. LAD: Normal caliber vessel with proximal roughly 30% calcified stenosis. There is also a focal 40% stenosis at the takeoff of D2. Distally there is 50% stenosis. There are 4 diagonal branches with the first to be moderate caliber second tubing of the small caliber. Minimal disease noted. The distal LAD does not reach all the way to the apex.  Left Circumflex: Normal caliber, nondominant vessel. It gives off a OM1 from the mid vessel. After the OM1 there is a tubular moderate 30-40% stenosis before the vessel bifurcates into a small AV groove branch and OM 2.  OM1: Small-moderate caliber vessel with diffuse 50% stenoses. No significant change from prior catheterization.  OM 2: Small- moderate caliber vessel that bifurcates distally into 2 small caliber branches. Angiographically normal.   RCA: Large-caliber, dominant vessel with 2 patent stents in the proximal segment. There is diffuse tubular 40% stenosis just prior to the crux. The vessel then  normalizes distally as it bifurcates into the Right Posterior AV Groove Branch (RPAV) and the Right Posterior Descending Artery (RPDA).  RPDA: Moderate-large-caliber vessel that is extensive and reaches to the apex and around and to the anteroapex. The vessel is somewhat tortuous but no significant disease.  RPL Sysytem:The RPAV begins as a moderate large-caliber vessel that terminates as one major moderate caliber posterolateral branch  with several smaller branches.  Assessment and Plan:  1. Symptomatically stable CAD status post DES 2 to the RCA in 2010, nonobstructive disease by follow-up cardiac catheterization last year. Plan is to continue medical therapy.  2. Paroxysmal atrial fibrillation by history without major recurrences. She is not anticoagulated with history of hemorrhagic pericardial effusion in the past, has tolerated low-dose amiodarone.  3. Tachycardia-bradycardia syndrome status post Medtronic pacemaker placement. She continues to follow with Dr. Lovena Le.  Current medicines were reviewed with the patient today.  Disposition: FU with me in 6 months.   Signed, Satira Sark, MD, [facility] 10/31/2015 11:53 AM    [facility] [address], [place], [place] [address] Phone: [phone]; Fax: [phone]

## 2015-10-31 NOTE — Patient Instructions (Signed)
Medication Instructions:  Your physician recommends that you continue on your current medications as directed. Please refer to the Current Medication list given to you today.   Labwork: NONE  Testing/Procedures: NONE  Follow-Up: Your physician wants you to follow-up in: 6 MONTHS .  You will receive a reminder letter in the mail two months in advance. If you don't receive a letter, please call our office to schedule the follow-up appointment.   Any Other Special Instructions Will Be Listed Below (If Applicable).  I WILL REQUEST LABS FROM YOUR PCP ( DR. HALL )    If you need a refill on your cardiac medications before your next appointment, please call your pharmacy.

## 2015-11-06 ENCOUNTER — Ambulatory Visit (HOSPITAL_COMMUNITY): Payer: Medicare Other | Attending: Orthopedic Surgery | Admitting: Physical Therapy

## 2015-11-08 DIAGNOSIS — I509 Heart failure, unspecified: Secondary | ICD-10-CM | POA: Diagnosis not present

## 2015-11-19 ENCOUNTER — Ambulatory Visit: Payer: Self-pay | Admitting: Orthopedic Surgery

## 2015-11-23 ENCOUNTER — Ambulatory Visit (INDEPENDENT_AMBULATORY_CARE_PROVIDER_SITE_OTHER): Payer: Medicare Other | Admitting: Orthopedic Surgery

## 2015-11-23 VITALS — BP 156/85 | HR 75 | Ht 66.0 in | Wt 154.0 lb

## 2015-11-23 DIAGNOSIS — M5442 Lumbago with sciatica, left side: Secondary | ICD-10-CM

## 2015-11-23 DIAGNOSIS — M5441 Lumbago with sciatica, right side: Secondary | ICD-10-CM

## 2015-11-23 NOTE — Progress Notes (Signed)
Patient ID: [name], female   DOB: 1936-04-09, 80 y.o.   MRN: [id number]  Chief Complaint  Patient presents with  . Follow-up    Follow up back, S/P therapy    HPI-80 year old female with ongoing lower back pain status post physical therapy. She was seen on March 13 for lower back pain bilateral leg pain and weakness with left leg radicular pain. After therapy she reports improvement in her overall symptoms.  Review of Systems  Constitutional: Negative for fever, chills, weight loss and malaise/fatigue.  Musculoskeletal: Positive for back pain.  Neurological: Negative for tingling and sensory change.    BP 156/85 mmHg  Pulse 75  Ht 5\' 6"  (1.676 m)  Wt 154 lb (69.854 kg)  BMI 24.87 kg/m2  Physical Exam  Constitutional: She is oriented to person, place, and time. She appears well-developed and well-nourished. No distress.  Cardiovascular: Normal rate and intact distal pulses.   Musculoskeletal:  Hip range of motion was normal in each hip no pain was reproduced there is no limb alignment no tenderness no swelling in both hips were stable  She had good strength in dorsiflexion plantar flexion he flexion extension hip flexion in each leg  Neurological: She is alert and oriented to person, place, and time. She displays abnormal reflex. She exhibits normal muscle tone. Coordination normal.  Ankle jerk reflexes were equal but 0-1 bilaterally.  Skin: Skin is warm and dry. No rash noted. She is not diaphoretic. No erythema. No pallor.  Psychiatric: She has a normal mood and affect. Her behavior is normal. Judgment and thought content normal.    Ortho Exam  She does ambulate without any assistive devices  ASSESSMENT AND PLAN   Stable lumbar disc disease  Follow-up as needed

## 2015-12-06 ENCOUNTER — Encounter: Payer: Self-pay | Admitting: Internal Medicine

## 2015-12-06 ENCOUNTER — Ambulatory Visit (INDEPENDENT_AMBULATORY_CARE_PROVIDER_SITE_OTHER): Payer: Medicare Other | Admitting: Internal Medicine

## 2015-12-06 VITALS — BP 160/90 | HR 86 | Ht 66.0 in | Wt 155.0 lb

## 2015-12-06 DIAGNOSIS — Z950 Presence of cardiac pacemaker: Secondary | ICD-10-CM | POA: Diagnosis not present

## 2015-12-06 DIAGNOSIS — I110 Hypertensive heart disease with heart failure: Secondary | ICD-10-CM | POA: Diagnosis not present

## 2015-12-06 LAB — CUP PACEART INCLINIC DEVICE CHECK
Battery Impedance: 518 Ohm
Battery Voltage: 2.79 V
Brady Statistic AP VP Percent: 0 %
Date Time Interrogation Session: 20170504133524
Implantable Lead Location: 753859
Implantable Lead Model: 5076
Lead Channel Impedance Value: 430 Ohm
Lead Channel Pacing Threshold Amplitude: 0.5 V
Lead Channel Sensing Intrinsic Amplitude: 1.4 mV
Lead Channel Setting Pacing Pulse Width: 0.4 ms
MDC IDC LEAD IMPLANT DT: 20100526
MDC IDC LEAD IMPLANT DT: 20100526
MDC IDC LEAD LOCATION: 753860
MDC IDC MSMT BATTERY REMAINING LONGEVITY: 86 mo
MDC IDC MSMT LEADCHNL RA PACING THRESHOLD PULSEWIDTH: 0.4 ms
MDC IDC MSMT LEADCHNL RV IMPEDANCE VALUE: 509 Ohm
MDC IDC MSMT LEADCHNL RV PACING THRESHOLD AMPLITUDE: 0.75 V
MDC IDC MSMT LEADCHNL RV PACING THRESHOLD PULSEWIDTH: 0.4 ms
MDC IDC MSMT LEADCHNL RV SENSING INTR AMPL: 8 mV
MDC IDC SET LEADCHNL RA PACING AMPLITUDE: 2 V
MDC IDC SET LEADCHNL RV PACING AMPLITUDE: 2.5 V
MDC IDC SET LEADCHNL RV SENSING SENSITIVITY: 2.8 mV
MDC IDC STAT BRADY AP VS PERCENT: 86 %
MDC IDC STAT BRADY AS VP PERCENT: 0 %
MDC IDC STAT BRADY AS VS PERCENT: 14 %

## 2015-12-06 NOTE — Patient Instructions (Signed)
Your physician wants you to follow-up in: 1 Year with Dr. Lovena Le. You will receive a reminder letter in the mail two months in advance. If you don't receive a letter, please call our office to schedule the follow-up appointment.  Your physician recommends that you schedule a follow-up appointment in the [facility] in 6 Months.   Your physician has requested that you have an echocardiogram. Echocardiography is a painless test that uses sound waves to create images of your heart. It provides your doctor with information about the size and shape of your heart and how well your heart's chambers and valves are working. This procedure takes approximately one hour. There are no restrictions for this procedure.   If you need a refill on your cardiac medications before your next appointment, please call your pharmacy.  Thank you for choosing [facility]!

## 2015-12-06 NOTE — Progress Notes (Signed)
HPI [name] returns today for followup. She is a 80 year old woman with a history of tachybrady syndrome, coronary disease, status post permanent pacemaker insertion. The patient is not thought to be a Coumadin candidate, particularly in light of her need for aspirin and Plavix. In the interim, she denies chest pain. She has minimal palpitation. No syncope.  She notes that she has become more sob and feels weaker. Allergies  Allergen Reactions  . Amitriptyline Hcl Other (See Comments)    Caused "jaws to twist and lock"  . Sulfonamide Derivatives Other (See Comments)    UNKNOWN REACTION     Current Outpatient Prescriptions  Medication Sig Dispense Refill  . ALPRAZolam (XANAX) 1 MG tablet Take by mouth at bedtime. 1/2 tab at night    . amiodarone (PACERONE) 100 MG tablet Take 100 mg by mouth daily.    Marland Kitchen aspirin 81 MG tablet Take 81 mg by mouth daily.    . clopidogrel (PLAVIX) 75 MG tablet Take 75 mg by mouth.     . Cyanocobalamin (B-12 PO) Take 1 tablet by mouth daily as needed (for supplement).    Marland Kitchen esomeprazole (NEXIUM) 40 MG capsule Take 1 po BID x 2 weeks then once a day (Patient taking differently: Take 40 mg by mouth 2 (two) times daily before a meal. ) 60 capsule 0  . furosemide (LASIX) 40 MG tablet Take 40-60 mg by mouth daily as needed (for swelling).     Marland Kitchen HYDROcodone-acetaminophen (NORCO) 7.5-325 MG tablet Take 1 tablet by mouth every 6 (six) hours. 42 tablet 0  . methocarbamol (ROBAXIN) 500 MG tablet Take 1 tablet (500 mg total) by mouth every 6 (six) hours as needed for muscle spasms. 56 tablet 5  . metoprolol (TOPROL-XL) 50 MG 24 hr tablet Take 75 mg by mouth every morning.     . nitroGLYCERIN (NITROSTAT) 0.4 MG SL tablet Place 0.4 mg under the tongue every 5 (five) minutes as needed for chest pain.     . potassium chloride SA (K-DUR,KLOR-CON) 20 MEQ tablet Take 20-40 mEq by mouth daily as needed (Takes 42mEq daily, but increases to 23mEq as needed for cramping.).     Marland Kitchen  primidone (MYSOLINE) 50 MG tablet Take 50 mg by mouth 2 (two) times daily as needed (for tremors).     . promethazine (PHENERGAN) 12.5 MG tablet Take 1 tablet (12.5 mg total) by mouth every 6 (six) hours as needed for nausea or vomiting. 60 tablet 1  . simvastatin (ZOCOR) 5 MG tablet Take 5 mg by mouth daily.    Marland Kitchen thiamine (VITAMIN B-1) 100 MG tablet Take 100 mg by mouth daily.    . valsartan (DIOVAN) 40 MG tablet Take 40 mg by mouth every evening.     Marland Kitchen VITAMIN D, CHOLECALCIFEROL, PO Take 1 capsule by mouth daily.     . vitamin E 400 UNIT capsule Take 400 Units by mouth daily.     No current facility-administered medications for this visit.     Past Medical History  Diagnosis Date  . Atrial fibrillation   . Coronary atherosclerosis of native coronary artery     DES x 2 to RCA 10/10  . GERD (gastroesophageal reflux disease)   . Tachycardia-bradycardia syndrome ([place])     s/p Medtronic Adapta L dual chamber device  5/10  . Hyperlipidemia   . COPD (chronic obstructive pulmonary disease) ([place])   . Anxiety   . Resting tremor   . 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 Chronic back pain   . Pericardial effusion     Hemorrhagic   . Essential hypertension, benign   . Headache(784.0)   . H/O hiatal hernia   . Neuromuscular disorder ([place])     Tremors  . HOH (hard of hearing)   . Presence of permanent cardiac pacemaker   . Anemia     history    ROS:   All systems reviewed and negative except as noted in the HPI.   Past Surgical History  Procedure Laterality Date  . Cholecystectomy    . Appendectomy    . Subxiphoid pericardial window  11/10  . Esophagogastroduodenoscopy with esophageal dilation  2004, 2006, 2007  . Vaginal hysterectomy    . Colonoscopy  2011  . Right rotator cuff repair    . Insert / replace / remove pacemaker    . Left heart catheterization with coronary angiogram N/A 11/17/2011    Procedure: LEFT HEART CATHETERIZATION WITH CORONARY  ANGIOGRAM;  Surgeon: Sherren Mocha, MD;  Location: [facility] CATH LAB;  Service: Cardiovascular;  Laterality: N/A;  . Left heart catheterization with coronary angiogram N/A 09/26/2014    Procedure: LEFT HEART CATHETERIZATION WITH CORONARY ANGIOGRAM;  Surgeon: Leonie Man, MD;  Location: [facility] CATH LAB;  Service: Cardiovascular;  Laterality: N/A;  . Colonoscopy N/A 10/19/2014    Procedure: COLONOSCOPY;  Surgeon: Rogene Houston, MD;  Location: [facility] ENDO SU[facility];  Service: Endoscopy;  Laterality: N/A;  1030  . Esophagogastroduodenoscopy N/A 10/26/2014    Procedure: ESOPHAGOGASTRODUODENOSCOPY (EGD);  Surgeon: Rogene Houston, MD;  Location: [facility] ENDO SUITE;  Service: Endoscopy;  Laterality: N/A;  230 - Dr. has lunch and learn  . [place] capsule study N/A 10/31/2014    Procedure: GIVENS CAPSULE STUDY;  Surgeon: Rogene Houston, MD;  Location: [facility] ENDO SUITE;  Service: Endoscopy;  Laterality: N/A;  730 -- pacemaker--needs monitoring--outpatient bed  . Cataract extraction    . Cardiac catheterization      stents x2.  . Shoulder open rotator cuff repair Right 05/30/2015    Procedure: OPEN ROTATOR CUFF REPAIR RIGHT SHOULDER;  Surgeon: Carole Civil, MD;  Location: [facility] ORS;  Service: Orthopedics;  Laterality: Right;  . Shoulder acromioplasty Right 05/30/2015    Procedure: RIGHT SHOULDER ACROMIOPLASTY;  Surgeon: Carole Civil, MD;  Location: [facility] ORS;  Service: Orthopedics;  Laterality: Right;     Family History  Problem Relation Age of Onset  . Cancer Mother     Colon   . Coronary artery disease Sister   . Coronary artery disease Brother   . Arthritis    . Lung disease    . Asthma       Social History   Social History  . Marital Status: Widowed    Spouse Name: N/A  . Number of Children: N/A  . Years of Education: N/A   Occupational History  . Retired    Social History Main Topics  . Smoking status: Former Smoker -- 2.00 packs/day for 10 years    Types: Cigarettes    Quit date: 05/23/1989   . Smokeless tobacco: Never Used  . Alcohol Use: No  . Drug Use: No  . Sexual Activity: No   Other Topics Concern  . Not on file   Social History Narrative   She lives alone, has adopted daughter.     BP 160/90 mmHg  Pulse 86  Ht 5\' 6"  (1.676 m)  Wt 155 lb (70.308 kg)  BMI 25.03  kg/m2  SpO2 95%  Physical Exam:  Well appearing 80 year old woman, NAD HEENT: Unremarkable Neck:  6 cm JVD, no thyromegally Lungs:  Clear with no wheezes, rales, or rhonchi. Well-healed pacemaker incision. HEART:  Regular rate rhythm, no murmurs, no rubs, no clicks Abd:  soft, positive bowel sounds, no organomegally, no rebound, no guarding Ext:  2 plus pulses, no edema, no cyanosis, no clubbing Skin:  No rashes no nodules Neuro:  CN II through XII intact, motor grossly intact  DEVICE  Normal device function.  See PaceArt for details.   Assess/Plan:  1. Dyspnea - the etiology is unclear. Will check a 2D echo.  2. HTN - her blood pressure is elevated. She is encouraged to reduce her salt intake. I would consider changing from metoprolol to coreg 3. PPM - her medtronic DDD PM is working normally. Will recheck in several months. 4. CAD - she is s/p stenting remotely. She will continue her current meds. No angina.  Mikle Bosworth.D.

## 2015-12-08 DIAGNOSIS — I509 Heart failure, unspecified: Secondary | ICD-10-CM | POA: Diagnosis not present

## 2015-12-10 ENCOUNTER — Ambulatory Visit (HOSPITAL_COMMUNITY)
Admission: RE | Admit: 2015-12-10 | Discharge: 2015-12-10 | Disposition: A | Discharge status: Home / Self Care | Payer: Medicare Other | Source: Ambulatory Visit | Attending: Internal Medicine | Admitting: Internal Medicine

## 2015-12-10 DIAGNOSIS — I351 Nonrheumatic aortic (valve) insufficiency: Secondary | ICD-10-CM | POA: Insufficient documentation

## 2015-12-10 DIAGNOSIS — R0600 Dyspnea, unspecified: Secondary | ICD-10-CM | POA: Diagnosis present

## 2015-12-10 DIAGNOSIS — I110 Hypertensive heart disease with heart failure: Secondary | ICD-10-CM | POA: Insufficient documentation

## 2015-12-10 DIAGNOSIS — I340 Nonrheumatic mitral (valve) insufficiency: Secondary | ICD-10-CM | POA: Diagnosis not present

## 2015-12-10 DIAGNOSIS — E785 Hyperlipidemia, unspecified: Secondary | ICD-10-CM | POA: Diagnosis not present

## 2015-12-10 DIAGNOSIS — I509 Heart failure, unspecified: Secondary | ICD-10-CM | POA: Diagnosis not present

## 2015-12-14 ENCOUNTER — Encounter: Payer: Self-pay | Admitting: Critical Care Medicine

## 2015-12-14 ENCOUNTER — Other Ambulatory Visit: Payer: Self-pay | Admitting: Critical Care Medicine

## 2015-12-14 DIAGNOSIS — I10 Essential (primary) hypertension: Secondary | ICD-10-CM

## 2015-12-14 MED ORDER — CARVEDILOL 12.5 MG PO TABS: 12.5000 mg | ORAL_TABLET | Freq: Two times a day (BID) | ORAL | Status: DC

## 2015-12-14 MED ORDER — VALSARTAN 160 MG PO TABS: 160.0000 mg | ORAL_TABLET | Freq: Every evening | ORAL | Status: DC

## 2015-12-14 NOTE — Progress Notes (Signed)
   Subjective:    Patient ID: [name], female    DOB: Oct 07, 1935, 80 y.o.   MRN: [id number]  HPI Chief Complaint  Patient presents with  . Hypertension   Pt with HTN.  Bp worse.  [facility] and [facility].  No chest pain.  Mild dyspnea.  No acute issues    Review of Systems Constitutional:   No  weight loss, night sweats,  Fevers, chills, fatigue, lassitude. HEENT:   No headaches,  Difficulty swallowing,  Tooth/dental problems,  Sore throat,                No sneezing, itching, ear ache, nasal congestion, post nasal drip,   CV:  No chest pain,  Orthopnea, PND, swelling in lower extremities, anasarca, dizziness, palpitations  GI  No heartburn, indigestion, abdominal pain, nausea, vomiting, diarrhea, change in bowel habits, loss of appetite  Resp: ++ shortness of breath with exertion or at rest.  No excess mucus, no productive cough,  No non-productive cough,  No coughing up of blood.  No change in color of mucus.  No wheezing.  No chest wall deformity  Skin: no rash or lesions.  GU: no dysuria, change in color of urine, no urgency or frequency.  No flank pain.  MS:  No joint pain or swelling.  No decreased range of motion.  No back pain.  Psych:  No change in mood or affect. No depression or anxiety.  No memory loss.     Objective:   Physical Exam Filed Vitals:   12/14/15 1049  BP: 194/95  Pulse: 73    Gen: Pleasant, well-nourished, in no distress,  normal affect  ENT: No lesions,  mouth clear,  oropharynx clear, no postnasal drip  Neck: No JVD, no TMG, no carotid bruits  Lungs: No use of accessory muscles, no dullness to percussion, clear without rales or rhonchi  Cardiovascular: RRR, heart sounds normal, no murmur or gallops, no peripheral edema  Abdomen: soft and NT, no HSM,  BS normal  Musculoskeletal: No deformities, no cyanosis or clubbing  Neuro: alert, non focal  Skin: Warm, no lesions or rashes  No results found.  Echo ok , done  recently      Assessment & Plan:  HTN poorly controlled  Plan  Change metoprolol to coreg 12.5 mg bid Increase valsartan to 160 daily  Give clonidene 0.2mg  NOW and recheck BP Obtain thn case manager consult  F/u with pcp

## 2015-12-18 ENCOUNTER — Ambulatory Visit (INDEPENDENT_AMBULATORY_CARE_PROVIDER_SITE_OTHER): Payer: Medicare Other | Admitting: Internal Medicine

## 2015-12-25 ENCOUNTER — Encounter: Payer: Self-pay | Admitting: Critical Care Medicine

## 2015-12-27 DIAGNOSIS — I2510 Atherosclerotic heart disease of native coronary artery without angina pectoris: Secondary | ICD-10-CM | POA: Diagnosis not present

## 2015-12-27 DIAGNOSIS — I10 Essential (primary) hypertension: Secondary | ICD-10-CM | POA: Diagnosis not present

## 2015-12-27 DIAGNOSIS — R05 Cough: Secondary | ICD-10-CM | POA: Diagnosis not present

## 2015-12-27 DIAGNOSIS — I480 Paroxysmal atrial fibrillation: Secondary | ICD-10-CM | POA: Diagnosis not present

## 2016-01-08 DIAGNOSIS — I509 Heart failure, unspecified: Secondary | ICD-10-CM | POA: Diagnosis not present

## 2016-02-07 DIAGNOSIS — I509 Heart failure, unspecified: Secondary | ICD-10-CM | POA: Diagnosis not present

## 2016-02-25 ENCOUNTER — Telehealth: Payer: Self-pay | Admitting: Internal Medicine

## 2016-02-25 NOTE — Telephone Encounter (Signed)
New message    Pt wants to talk about test result. Please call.

## 2016-02-29 NOTE — Telephone Encounter (Signed)
Called patient with test results. No answer. Left message to call back.  

## 2016-03-04 ENCOUNTER — Telehealth: Payer: Self-pay | Admitting: Internal Medicine

## 2016-03-04 NOTE — Telephone Encounter (Signed)
Results of echo done in May  tg

## 2016-03-05 NOTE — Telephone Encounter (Signed)
Echo resulted by Kerin Ransom, PA-C " Good formation heart foundation. Poor relaxation. Take B/P meds and avoid salt. "   Patient notified of test results. And voiced understanding.

## 2016-03-06 NOTE — Telephone Encounter (Signed)
Patient was given results on 03/05/16 (see phone note dated 02/24/17).

## 2016-03-09 DIAGNOSIS — I509 Heart failure, unspecified: Secondary | ICD-10-CM | POA: Diagnosis not present

## 2016-03-20 ENCOUNTER — Telehealth: Payer: Self-pay

## 2016-03-20 NOTE — Telephone Encounter (Signed)
-----   Message from Evans Lance, MD sent at 03/19/2016  9:46 PM EDT ----- Regarding: RE: Result note Heart pumping function is normal with mildly leaky aortic and mitral valve and diastolic dysfunction. Pressure inside the heart are a little elevated. GT ----- Message ----- From: Levonne Hubert, LPN Sent: [id number]   9:34 AM To: Dionicio Stall, RN, Evans Lance, MD Subject: Result note                                    Please send result note for Echo done on 12/10/15   Thank you,  Alda Berthold

## 2016-03-20 NOTE — Telephone Encounter (Signed)
LMTCB-cc 

## 2016-03-21 NOTE — Telephone Encounter (Signed)
Granddaughter April given echo results again,we have her phone number as her grandmother is HOH.she will have her sign DPR

## 2016-03-21 NOTE — Progress Notes (Signed)
Cardiology Office Note  Date: 03/24/2016   ID: [name], DOB 08-06-1935, MRN [id number]  PCP: Wende Neighbors, MD  Primary Cardiologist: Rozann Lesches, MD   Chief Complaint  Patient presents with  . Coronary Artery Disease    History of Present Illness: [name] is a 80 y.o. female last seen in March. She presents for a follow-up visit. She tells me that she has been more short of breath and that her blood pressure has not been well controlled. I reviewed the chart, systolics have been as high as the 190s on her most recent visits, when she saw Dr. Lovena Le in May he suggested changing from Toprol-XL to [place]. She states that she was frightened to make this change and never switched, however her Diovan was increased to 80 mg daily at some point along the way. She was very confused about her medications today, we went over her list in detail and I reviewed the last several notes.  She continues to follow with Dr. Lovena Le with Medtronic pacemaker in place for tachycardia-bradycardia syndrome. She was seen in May. Dr. Lovena Le ordered a follow-up echocardiogram at that time, results are outlined below. I reviewed the report and discussed it with her today.  She does not report any frank angina symptoms or palpitations. Describes no change in her salt intake or diet.  Past Medical History:  Diagnosis Date  . Anemia    history  . Anxiety   . Atrial fibrillation   . Chronic back pain   . COPD (chronic obstructive pulmonary disease) ([place])   . Coronary atherosclerosis of native coronary artery    DES x 2 to RCA 10/10  . [name] syndrome [facility])    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   . Essential hypertension, benign   . GERD (gastroesophageal reflux disease)   . H/O hiatal hernia   . Headache(784.0)   . HOH (hard of hearing)   . Hyperlipidemia   . Neuromuscular disorder ([place])    Tremors  . Pericardial effusion    Hemorrhagic   . Presence of permanent cardiac pacemaker   . Resting  tremor   . Tachycardia-bradycardia syndrome ([place])    s/p Medtronic Adapta L dual chamber device  5/10    Past Surgical History:  Procedure Laterality Date  . APPENDECTOMY    . CARDIAC CATHETERIZATION     stents x2.  Marland Kitchen CATARACT EXTRACTION    . CHOLECYSTECTOMY    . COLONOSCOPY  2011  . COLONOSCOPY N/A 10/19/2014   Procedure: COLONOSCOPY;  Surgeon: Rogene Houston, MD;  [facility] ENDO [facility];  Service: Endoscopy;  Laterality: N/A;  1030  . ESOPHAGOGASTRODUODENOSCOPY N/A 10/26/2014   Procedure: ESOPHAGOGASTRODUODENOSCOPY (EGD);  Surgeon: Rogene Houston, MD;  Location: [facility] ENDO SU[facility];  Service: Endoscopy;  Laterality: N/A;  230 - Dr. has lunch and learn  . Esophagogastroduodenoscopy with esophageal dilation  2004, 2006, 2007  . GIVENS CAPSULE STUDY N/A 10/31/2014   Procedure: GIVENS CAPSULE STUDY;  Surgeon: Rogene Houston, MD;  Location: [facility] ENDO SUITE;  Service: Endoscopy;  Laterality: N/A;  730 -- pacemaker--needs monitoring--outpatient bed  . INSERT / REPLACE / REMOVE PACEMAKER    . LEFT HEART CATHETERIZATION WITH CORONARY ANGIOGRAM N/A 11/17/2011   Procedure: LEFT HEART CATHETERIZATION WITH CORONARY ANGIOGRAM;  Surgeon: Sherren Mocha, MD;  Location: [facility] CATH LAB;  Service: Cardiovascular;  Laterality: N/A;  . LEFT HEART CATHETERIZATION WITH CORONARY ANGIOGRAM N/A 09/26/2014   Procedure: LEFT HEART CATHETERIZATION WITH CORONARY ANGIOGRAM;  Surgeon: Shanon Brow  Loren Racer, MD;  Location: [facility] CATH LAB;  Service: Cardiovascular;  Laterality: N/A;  . Right rotator cuff repair    . SHOULDER ACROMIOPLASTY Right 05/30/2015   Procedure: RIGHT SHOULDER ACROMIOPLASTY;  Surgeon: Carole Civil, MD;  Location: [facility] ORS;  Service: Orthopedics;  Laterality: Right;  . SHOULDER OPEN ROTATOR CUFF REPAIR Right 05/30/2015   Procedure: OPEN ROTATOR CUFF REPAIR RIGHT SHOULDER;  Surgeon: Carole Civil, MD;  Location: [facility] ORS;  Service: Orthopedics;  Laterality: Right;  . Subxiphoid pericardial window  11/10  .  VAGINAL HYSTERECTOMY      Current Outpatient Prescriptions  Medication Sig Dispense Refill  . ALPRAZolam (XANAX) 1 MG tablet Take by mouth at bedtime. 1/2 tab at night    . amiodarone (PACERONE) 100 MG tablet Take 100 mg by mouth daily.    Marland Kitchen aspirin 81 MG tablet Take 81 mg by mouth daily.    . clopidogrel (PLAVIX) 75 MG tablet Take 75 mg by mouth.     . Cyanocobalamin (B-12 PO) Take 1 tablet by mouth daily as needed (for supplement).    Marland Kitchen esomeprazole (NEXIUM) 40 MG capsule Take 1 po BID x 2 weeks then once a day (Patient taking differently: Take 40 mg by mouth 2 (two) times daily before a meal. ) 60 capsule 0  . furosemide (LASIX) 40 MG tablet Take 40-60 mg by mouth daily as needed (for swelling).     Marland Kitchen HYDROcodone-acetaminophen (NORCO) 7.5-325 MG tablet Take 1 tablet by mouth every 6 (six) hours. 42 tablet 0  . methocarbamol (ROBAXIN) 500 MG tablet Take 1 tablet (500 mg total) by mouth every 6 (six) hours as needed for muscle spasms. 56 tablet 5  . nitroGLYCERIN (NITROSTAT) 0.4 MG SL tablet Place 0.4 mg under the tongue every 5 (five) minutes as needed for chest pain.     . potassium chloride SA (K-DUR,KLOR-CON) 20 MEQ tablet Take 20-40 mEq by mouth daily as needed (Takes 68mEq daily, but increases to 89mEq as needed for cramping.).     Marland Kitchen primidone (MYSOLINE) 50 MG tablet Take 50 mg by mouth 2 (two) times daily as needed (for tremors).     . promethazine (PHENERGAN) 12.5 MG tablet Take 1 tablet (12.5 mg total) by mouth every 6 (six) hours as needed for nausea or vomiting. 60 tablet 1  . simvastatin (ZOCOR) 5 MG tablet Take 5 mg by mouth daily.    Marland Kitchen thiamine (VITAMIN B-1) 100 MG tablet Take 100 mg by mouth daily.    Marland Kitchen VITAMIN D, CHOLECALCIFEROL, PO Take 1 capsule by mouth daily.     . vitamin E 400 UNIT capsule Take 400 Units by mouth daily.    . metoprolol (LOPRESSOR) 50 MG tablet Take 1.5 tablets (75 mg total) by mouth daily. 135 tablet 3  . valsartan (DIOVAN) 160 MG tablet Take 1 tablet  (160 mg total) by mouth daily. 90 tablet 1   No current facility-administered medications for this visit.    Allergies:  Amitriptyline hcl and Sulfonamide derivatives   Social History: The patient  reports that she quit smoking about 26 years ago. Her smoking use included Cigarettes. She has a 20.00 pack-year smoking history. She has never used smokeless tobacco. She reports that she does not drink alcohol or use drugs.   ROS:  Please see the history of present illness. Otherwise, complete review of systems is positive for chronic tremors, decreased hearing, anxiety.  All other systems are reviewed and negative.   Physical Exam: VS:  BP (!) 162/90   Pulse 63   Ht 5\' 5"  (1.651 m)   Wt 156 lb (70.8 kg)   SpO2 97%   BMI 25.96 kg/m , BMI Body mass index is 25.96 kg/m.  Wt Readings from Last 3 Encounters:  03/24/16 156 lb (70.8 kg)  12/06/15 155 lb (70.3 kg)  11/23/15 154 lb (69.9 kg)    General: No distress. HEENT: Conjunctiva and lids normal, oropharynx clear. Neck: Supple, no elevated JVP or carotid bruits, no thyromegaly. Lungs: Clear to auscultation, nonlabored breathing at rest. Cardiac: Regular rate and rhythm, no S3, soft systolic murmur, no pericardial rub. Abdomen: Soft, nontender, bowel sounds present, no guarding or rebound. Extremities: Trace ankle edema, distal pulses 2+. Skin: Warm and dry. Musculoskeletal: Mild kyphosis. Neuropsychiatric: Alert and oriented x3, affect grossly appropriate. Hard of hearing. Resting tremor.  ECG: I personally reviewed the tracing from 03/29/2015 which showed an atrial paced rhythm.  Recent Labwork: 05/24/2015: BUN 8; Creatinine, Ser 0.81; Hemoglobin 12.9; Platelets 209; Potassium 4.9; Sodium 139   Other Studies Reviewed Today:  Cardiac catheterization 09/26/2014: FINDINGS:  Hemodynamics:   Central Aortic Pressure / Mean: 112/50/74 mmHg  Left Ventricular Pressure / LVEDP: 111/3/12 mmHg  Left Ventriculography:  EF: 55-60  %  Wall Motion: Overall normal wall motion with a somewhat globular appearing apex  Coronary Anatomy:  Dominance: Right  Left Main: Normal caliber vessel with moderate calcification. It bifurcates into the LAD And Circumflex. No significant stenoses. LAD: Normal caliber vessel with proximal roughly 30% calcified stenosis. There is also a focal 40% stenosis at the takeoff of D2. Distally there is 50% stenosis. There are 4 diagonal branches with the first to be moderate caliber second tubing of the small caliber. Minimal disease noted. The distal LAD does not reach all the way to the apex.  Left Circumflex: Normal caliber, nondominant vessel. It gives off a OM1 from the mid vessel. After the OM1 there is a tubular moderate 30-40% stenosis before the vessel bifurcates into a small AV groove branch and OM 2.  OM1: Small-moderate caliber vessel with diffuse 50% stenoses. No significant change from prior catheterization.  OM 2: Small- moderate caliber vessel that bifurcates distally into 2 small caliber branches. Angiographically normal.   RCA: Large-caliber, dominant vessel with 2 patent stents in the proximal segment. There is diffuse tubular 40% stenosis just prior to the crux. The vessel then normalizes distally as it bifurcates into the Right Posterior AV Groove Branch (RPAV) and the Right Posterior Descending Artery (RPDA).  RPDA: Moderate-large-caliber vessel that is extensive and reaches to the apex and around and to the anteroapex. The vessel is somewhat tortuous but no significant disease.  RPL Sysytem:The RPAV begins as a moderate large-caliber vessel that terminates as one major moderate caliber posterolateral branch with several smaller branches.  Echocardiogram 12/10/2015: Study Conclusions  - Left ventricle: The cavity size was normal. Wall thickness was   increased in a pattern of moderate LVH. There was focal basal   hypertrophy. Systolic function was normal. The estimated  ejection   fraction was in the range of 60% to 65%. Wall motion was normal;   there were no regional wall motion abnormalities. Features are   consistent with a pseudonormal left ventricular filling pattern,   with concomitant abnormal relaxation and increased filling   pressure (grade 2 diastolic dysfunction). Doppler parameters are   consistent with high ventricular filling pressure. - Aortic valve: Moderately calcified annulus. Trileaflet;   moderately thickened leaflets. There was mild  regurgitation.   Valve area (VTI): 2.32 cm^2. Valve area (Vmax): 2.3 cm^2.   Regurgitation pressure half-time: 633 ms. - Mitral valve: Moderately to severely calcified annulus. Mildly   thickened leaflets . There was mild regurgitation. - Left atrium: The atrium was severely dilated. - Right ventricle: The cavity size was mildly dilated. - Right atrium: The atrium was severely dilated. - Pulmonary arteries: Systolic pressure was mildly increased. PA   peak pressure: 36 mm Hg (S). - Technically adequate study.  Assessment and Plan:  1. Essential hypertension, not optimally controlled although trend today is better than her last visit with Dr. Lovena Le. Recommending increase Diovan to 160 mg daily, continue remaining medications including Toprol-XL (do not switch to Coreg for now). Follow-up BMET in 2 weeks.  2. Diastolic heart failure, LVEF 60-65% with grade 2 diastolic dysfunction and increased LV filling pressures. She is on Lasix in addition to ARB. Her weight is stable.  3. CAD status post DES 2 to the RCA in 2010. Cardiac apposition from last year revealed patent stent sites.  4. Tachycardia-bradycardia syndrome status post Medtronic pacemaker placement. She follows with Dr. Lovena Le.  5. Paroxysmal atrial fibrillation with no major symptomatic recurrences. She is on low-dose amiodarone and beta blocker, not anticoagulated with prior history of hemorrhagic pericardial effusion.  Current medicines  were reviewed with the patient today.   Orders Placed This Encounter  Procedures  . Basic Metabolic Panel (BMET)  . EKG 12-Lead    Disposition: Follow-up with me in 3 months.  Signed, Satira Sark, MD, [facility] 03/24/2016 8:33 AM    [facility]. [address] [place],  [address] Phone: [phone]; Fax: [phone]

## 2016-03-24 ENCOUNTER — Ambulatory Visit (INDEPENDENT_AMBULATORY_CARE_PROVIDER_SITE_OTHER): Payer: Medicare Other | Admitting: Cardiology

## 2016-03-24 ENCOUNTER — Encounter: Payer: Self-pay | Admitting: Cardiology

## 2016-03-24 VITALS — BP 162/90 | HR 63 | Ht 65.0 in | Wt 156.0 lb

## 2016-03-24 DIAGNOSIS — Z950 Presence of cardiac pacemaker: Secondary | ICD-10-CM

## 2016-03-24 DIAGNOSIS — I2510 Atherosclerotic heart disease of native coronary artery without angina pectoris: Secondary | ICD-10-CM

## 2016-03-24 DIAGNOSIS — I10 Essential (primary) hypertension: Secondary | ICD-10-CM

## 2016-03-24 DIAGNOSIS — I480 Paroxysmal atrial fibrillation: Secondary | ICD-10-CM

## 2016-03-24 MED ORDER — METOPROLOL TARTRATE 50 MG PO TABS: 75.0000 mg | ORAL_TABLET | Freq: Every day | ORAL | 3 refills | Status: DC

## 2016-03-24 MED ORDER — METOPROLOL SUCCINATE ER 25 MG PO TB24: 75.0000 mg | ORAL_TABLET | Freq: Every day | ORAL | 3 refills | Status: DC

## 2016-03-24 MED ORDER — VALSARTAN 160 MG PO TABS: 160.0000 mg | ORAL_TABLET | Freq: Every day | ORAL | 1 refills | Status: DC

## 2016-03-24 NOTE — Addendum Note (Signed)
Addended by: Barbarann Ehlers A on: 03/24/2016 08:54 AM   Modules accepted: Orders

## 2016-03-24 NOTE — Patient Instructions (Addendum)
Your physician recommends that you schedule a follow-up appointment in:  3 months Dr Domenic Polite  Do NOT take COREG   START Metoprolol 75 mg ( take 3- 25 mg pills a day)   INCREASE Diovan to 160 mg daily   Get blood work in 2 weeks :BMET     Thank you for choosing [facility] !

## 2016-03-25 ENCOUNTER — Ambulatory Visit (INDEPENDENT_AMBULATORY_CARE_PROVIDER_SITE_OTHER): Payer: Medicare Other | Admitting: Internal Medicine

## 2016-04-01 ENCOUNTER — Ambulatory Visit (INDEPENDENT_AMBULATORY_CARE_PROVIDER_SITE_OTHER): Payer: Self-pay | Admitting: Internal Medicine

## 2016-04-01 ENCOUNTER — Encounter (INDEPENDENT_AMBULATORY_CARE_PROVIDER_SITE_OTHER): Payer: Self-pay | Admitting: Internal Medicine

## 2016-04-04 ENCOUNTER — Ambulatory Visit: Payer: Self-pay | Admitting: Orthopedic Surgery

## 2016-04-08 DIAGNOSIS — I4891 Unspecified atrial fibrillation: Secondary | ICD-10-CM | POA: Diagnosis not present

## 2016-04-09 ENCOUNTER — Ambulatory Visit: Payer: Self-pay | Admitting: Orthopedic Surgery

## 2016-04-09 DIAGNOSIS — I509 Heart failure, unspecified: Secondary | ICD-10-CM | POA: Diagnosis not present

## 2016-04-09 LAB — BASIC METABOLIC PANEL
BUN: 10 mg/dL (ref 7–25)
CHLORIDE: 101 mmol/L (ref 98–110)
CO2: 28 mmol/L (ref 20–31)
Calcium: 9.3 mg/dL (ref 8.6–10.4)
Creat: 0.62 mg/dL (ref 0.60–0.93)
Glucose, Bld: 77 mg/dL (ref 65–99)
POTASSIUM: 4.6 mmol/L (ref 3.5–5.3)
Sodium: 141 mmol/L (ref 135–146)

## 2016-04-21 ENCOUNTER — Ambulatory Visit (INDEPENDENT_AMBULATORY_CARE_PROVIDER_SITE_OTHER): Payer: Medicare Other | Admitting: Orthopedic Surgery

## 2016-04-21 ENCOUNTER — Encounter: Payer: Self-pay | Admitting: Orthopedic Surgery

## 2016-04-21 VITALS — BP 181/92 | HR 81 | Ht 63.5 in | Wt 156.0 lb

## 2016-04-21 DIAGNOSIS — M75102 Unspecified rotator cuff tear or rupture of left shoulder, not specified as traumatic: Secondary | ICD-10-CM

## 2016-04-21 DIAGNOSIS — M545 Low back pain: Secondary | ICD-10-CM

## 2016-04-21 DIAGNOSIS — M25512 Pain in left shoulder: Secondary | ICD-10-CM | POA: Diagnosis not present

## 2016-04-21 NOTE — Patient Instructions (Signed)

## 2016-04-21 NOTE — Progress Notes (Signed)
Patient ID: [name], female   DOB: [birth year], 80 y.o.   MRN: [id number]  Chief Complaint  Patient presents with  . Follow-up    BACK PAIN    HPI [name] is a 80 y.o. female.  The patient has a lower back pain and leg pain with some sciatica which she follows up for. HPI  Prior workup for lower back pain and sciatica treated with nonoperative measures she still continues to have intermittent symptoms with some days worse than others. That has not changed  She has new problem with left shoulder pain no trauma pain present for one month pain with forward elevation mild weakness dull ache worse with overhead activity    Review of Systems Review of Systems  Constitutional: Negative for chills and fever.  Musculoskeletal: Positive for arthralgias, back pain, gait problem and myalgias.  Neurological: Negative for numbness.    Past Medical History:  Diagnosis Date  . Anemia    history  . Anxiety   . Atrial fibrillation   . Chronic back pain   . COPD (chronic obstructive pulmonary disease) ([place])   . Coronary atherosclerosis of native coronary artery    DES x 2 to RCA 10/10  . 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 Essential hypertension, benign   . GERD (gastroesophageal reflux disease)   . H/O hiatal hernia   . Headache(784.0)   . HOH (hard of hearing)   . Hyperlipidemia   . Neuromuscular disorder ([address])    Tremors  . Pericardial effusion    Hemorrhagic   . Presence of permanent cardiac pacemaker   . Resting tremor   . Tachycardia-bradycardia syndrome (H[facility])    s/p Medtronic Adapta L dual chamber device  5/10    Physical Exam BP (!) 181/92   Pulse 81   Ht 5' 3.5" (1.613 m)   Wt 156 lb (70.8 kg)   BMI 27.20 kg/m  Physical Exam  Constitutional: She is oriented to person, place, and time. She appears well-developed and well-nourished. No distress.  Cardiovascular: Normal rate and intact distal pulses.   Neurological: She is alert and  oriented to person, place, and time. She has normal reflexes. She exhibits normal muscle tone. Coordination normal.  Skin: Skin is warm and dry. No rash noted. She is not diaphoretic. No erythema. No pallor.  Psychiatric: She has a normal mood and affect. Her behavior is normal. Judgment and thought content normal.   Left shoulder inspection reveals tenderness in the anterior rotator interval. Her active range of motion is 150 of flexion with painful range of motion between 120 150. Shoulder still stable in abduction external rotation is mild weakness in the rotator cuff supraspinatus. Skin is normal pulses are good lymph nodes are negative in the axilla and cervical region. No sensory abnormality. Mild neck tenderness unrelated  Right shoulder range of motion is normal  No x-rays were obtained  Medical diagnosis rotator cuff syndrome  Lower back pain with sciatica stable  Recommend injection subacromial space left shoulder  Follow-up as needed  Procedure note the subacromial injection shoulder left   Verbal consent was obtained to inject the  Left   Shoulder  Timeout was completed to confirm the injection site is a subacromial space of the  left  shoulder  Medication used Depo-Medrol 40 mg and lidocaine 1% 3 cc  Anesthesia was provided by ethyl chloride  The injection was performed in the left  posterior subacromial space. After  pinning the skin with alcohol and anesthetized the skin with ethyl chloride the subacromial space was injected using a 20-gauge needle. There were no complications  Sterile dressing was applied.

## 2016-04-29 DIAGNOSIS — Z0000 Encounter for general adult medical examination without abnormal findings: Secondary | ICD-10-CM | POA: Diagnosis not present

## 2016-04-29 DIAGNOSIS — R251 Tremor, unspecified: Secondary | ICD-10-CM | POA: Diagnosis not present

## 2016-04-29 DIAGNOSIS — I10 Essential (primary) hypertension: Secondary | ICD-10-CM | POA: Diagnosis not present

## 2016-04-29 DIAGNOSIS — Z23 Encounter for immunization: Secondary | ICD-10-CM | POA: Diagnosis not present

## 2016-04-29 DIAGNOSIS — E782 Mixed hyperlipidemia: Secondary | ICD-10-CM | POA: Diagnosis not present

## 2016-04-29 DIAGNOSIS — I480 Paroxysmal atrial fibrillation: Secondary | ICD-10-CM | POA: Diagnosis not present

## 2016-05-09 DIAGNOSIS — I509 Heart failure, unspecified: Secondary | ICD-10-CM | POA: Diagnosis not present

## 2016-06-05 ENCOUNTER — Ambulatory Visit (INDEPENDENT_AMBULATORY_CARE_PROVIDER_SITE_OTHER): Payer: Medicare Other | Admitting: *Deleted

## 2016-06-05 DIAGNOSIS — I495 Sick sinus syndrome: Secondary | ICD-10-CM

## 2016-06-05 LAB — CUP PACEART INCLINIC DEVICE CHECK
Battery Remaining Longevity: 77 mo
Brady Statistic AP VP Percent: 0 %
Brady Statistic AS VS Percent: 6 %
Implantable Lead Implant Date: 20100526
Implantable Lead Location: 753859
Implantable Lead Model: 5076
Implantable Pulse Generator Implant Date: 20100526
Lead Channel Impedance Value: 449 Ohm
Lead Channel Impedance Value: 506 Ohm
Lead Channel Pacing Threshold Amplitude: 0.5 V
Lead Channel Pacing Threshold Amplitude: 0.5 V
Lead Channel Pacing Threshold Amplitude: 0.5 V
Lead Channel Pacing Threshold Pulse Width: 0.4 ms
Lead Channel Pacing Threshold Pulse Width: 0.4 ms
Lead Channel Pacing Threshold Pulse Width: 0.4 ms
Lead Channel Sensing Intrinsic Amplitude: 1.4 mV
Lead Channel Setting Pacing Amplitude: 2.5 V
Lead Channel Setting Sensing Sensitivity: 5.6 mV
MDC IDC LEAD IMPLANT DT: 20100526
MDC IDC LEAD LOCATION: 753860
MDC IDC MSMT BATTERY IMPEDANCE: 641 Ohm
MDC IDC MSMT BATTERY VOLTAGE: 2.79 V
MDC IDC MSMT LEADCHNL RA PACING THRESHOLD PULSEWIDTH: 0.4 ms
MDC IDC MSMT LEADCHNL RV PACING THRESHOLD AMPLITUDE: 0.75 V
MDC IDC MSMT LEADCHNL RV SENSING INTR AMPL: 11.2 mV
MDC IDC SESS DTM: 20171102131325
MDC IDC SET LEADCHNL RA PACING AMPLITUDE: 2 V
MDC IDC SET LEADCHNL RV PACING PULSEWIDTH: 0.4 ms
MDC IDC STAT BRADY AP VS PERCENT: 94 %
MDC IDC STAT BRADY AS VP PERCENT: 0 %

## 2016-06-05 NOTE — Progress Notes (Signed)
Pacemaker check in clinic. Normal device function. Thresholds, sensing, impedances consistent with previous measurements. Device programmed to maximize longevity. No mode switch or high ventricular rates noted. Device programmed at appropriate safety margins. Histogram distribution appropriate for patient activity level. Device programmed to optimize intrinsic conduction. Estimated longevity 6.5 years. Patient will follow up with GT/R in 6 months.

## 2016-06-09 DIAGNOSIS — I509 Heart failure, unspecified: Secondary | ICD-10-CM | POA: Diagnosis not present

## 2016-06-17 ENCOUNTER — Encounter (HOSPITAL_COMMUNITY): Payer: Self-pay | Admitting: Emergency Medicine

## 2016-06-17 ENCOUNTER — Emergency Department (HOSPITAL_COMMUNITY)
Admission: EM | Admit: 2016-06-17 | Discharge: 2016-06-17 | Disposition: A | Discharge status: Home / Self Care | Payer: Medicare Other | Attending: Emergency Medicine | Admitting: Emergency Medicine

## 2016-06-17 ENCOUNTER — Emergency Department (HOSPITAL_COMMUNITY): Payer: Medicare Other

## 2016-06-17 DIAGNOSIS — J449 Chronic obstructive pulmonary disease, unspecified: Secondary | ICD-10-CM | POA: Diagnosis not present

## 2016-06-17 DIAGNOSIS — I110 Hypertensive heart disease with heart failure: Secondary | ICD-10-CM | POA: Diagnosis not present

## 2016-06-17 DIAGNOSIS — Z79899 Other long term (current) drug therapy: Secondary | ICD-10-CM | POA: Insufficient documentation

## 2016-06-17 DIAGNOSIS — R0602 Shortness of breath: Secondary | ICD-10-CM | POA: Insufficient documentation

## 2016-06-17 DIAGNOSIS — I509 Heart failure, unspecified: Secondary | ICD-10-CM | POA: Insufficient documentation

## 2016-06-17 DIAGNOSIS — I2510 Atherosclerotic heart disease of native coronary artery without angina pectoris: Secondary | ICD-10-CM | POA: Insufficient documentation

## 2016-06-17 DIAGNOSIS — R0600 Dyspnea, unspecified: Secondary | ICD-10-CM

## 2016-06-17 DIAGNOSIS — Z7982 Long term (current) use of aspirin: Secondary | ICD-10-CM | POA: Diagnosis not present

## 2016-06-17 DIAGNOSIS — Z87891 Personal history of nicotine dependence: Secondary | ICD-10-CM | POA: Diagnosis not present

## 2016-06-17 LAB — COMPREHENSIVE METABOLIC PANEL
ALBUMIN: 3.8 g/dL (ref 3.5–5.0)
ALT: 17 U/L (ref 14–54)
ANION GAP: 6 (ref 5–15)
AST: 21 U/L (ref 15–41)
Alkaline Phosphatase: 66 U/L (ref 38–126)
BUN: 11 mg/dL (ref 6–20)
CHLORIDE: 107 mmol/L (ref 101–111)
CO2: 28 mmol/L (ref 22–32)
Calcium: 9.5 mg/dL (ref 8.9–10.3)
Creatinine, Ser: 0.67 mg/dL (ref 0.44–1.00)
GFR calc Af Amer: >60 mL/min (ref >60)
GFR calc non Af Amer: >60 mL/min (ref >60)
GLUCOSE: 88 mg/dL (ref 65–99)
POTASSIUM: 3.5 mmol/L (ref 3.5–5.1)
SODIUM: 141 mmol/L (ref 135–145)
Total Bilirubin: 0.5 mg/dL (ref 0.3–1.2)
Total Protein: 7.8 g/dL (ref 6.5–8.1)

## 2016-06-17 LAB — CBC WITH DIFFERENTIAL/PLATELET
BASOS PCT: 1 %
Basophils Absolute: 0 10*3/uL (ref 0.0–0.1)
EOS ABS: 0.1 10*3/uL (ref 0.0–0.7)
Eosinophils Relative: 2 %
HCT: 39.5 % (ref 36.0–46.0)
HEMOGLOBIN: 12.8 g/dL (ref 12.0–15.0)
Lymphocytes Relative: 43 %
Lymphs Abs: 2.8 10*3/uL (ref 0.7–4.0)
MCH: 29.6 pg (ref 26.0–34.0)
MCHC: 32.4 g/dL (ref 30.0–36.0)
MCV: 91.4 fL (ref 78.0–100.0)
MONOS PCT: 13 %
Monocytes Absolute: 0.9 10*3/uL (ref 0.1–1.0)
NEUTROS PCT: 43 %
Neutro Abs: 2.8 10*3/uL (ref 1.7–7.7)
PLATELETS: 210 10*3/uL (ref 150–400)
RBC: 4.32 MIL/uL (ref 3.87–5.11)
RDW: 14.4 % (ref 11.5–15.5)
WBC: 6.7 10*3/uL (ref 4.0–10.5)

## 2016-06-17 LAB — TROPONIN I: Troponin I: <0.03 ng/mL (ref <0.03)

## 2016-06-17 MED ORDER — ALBUTEROL SULFATE (2.5 MG/3ML) 0.083% IN NEBU: 5.0000 mg | INHALATION_SOLUTION | Freq: Once | RESPIRATORY_TRACT | Status: DC

## 2016-06-17 NOTE — Discharge Instructions (Signed)
Continue taking your usual medications.  Call your Dr. for a follow-up appointment in one week, sooner if needed.

## 2016-06-17 NOTE — ED Provider Notes (Signed)
[facility] DEPT Provider Note   CSN: [id number] Arrival date & time: 06/17/16  2000     History   Chief Complaint Chief Complaint  Patient presents with  . Shortness of Breath    HPI [name] is a 80 y.o. female.  The patient was here with her daughter in emergency department, when a nursing tech noticed that she looked short of breath. She asked the patient if she wanted to be seen, and the patient affirmed. The patient reports intermittent chronic shortness of breath, no different than usual at this time. She uses oxygen sporadically but was not using it, while she was waiting with her daughter. The patient drove her vehicle here, with her daughter. The patient denies fever, chills, change in cough, nausea, vomiting, weakness or dizziness. She is using her usual medications, as directed. There are no other known modifying factors.  HPI  Past Medical History:  Diagnosis Date  . Anemia    history  . Anxiety   . Atrial fibrillation   . Chronic back pain   . COPD (chronic obstructive pulmonary disease) ([place])   . Coronary atherosclerosis of native coronary artery    DES x 2 to RCA 10/10  . 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 Essential hypertension, benign   . GERD (gastroesophageal reflux disease)   . H/O hiatal hernia   . Headache(784.0)   . HOH (hard of hearing)   . Hyperlipidemia   . Neuromuscular disorder ([place])    Tremors  . Pericardial effusion    Hemorrhagic   . Presence of permanent cardiac pacemaker   . Resting tremor   . Tachycardia-bradycardia syndrome [facility])    s/p Medtronic Adapta L dual chamber device  5/10    Patient Active Problem List   Diagnosis Date Noted  . Rotator cuff tear   . Anemia 10/31/2014  . Chest pain with moderate risk for cardiac etiology 08/23/2014  . Diastolic dysfunction with chronic heart failure ([place]) 08/23/2014  . PERICARDIAL EFFUSION 07/10/2009  . Essential hypertension, benign 04/18/2009  .  Atherosclerosis of native coronary artery with angina pectoris ([place]) 04/18/2009  . PPM-Medtronic 04/04/2009  . BRADYCARDIA-TACHYCARDIA SYNDROME 01/26/2009  . HLD (hyperlipidemia) 12/19/2008  . Anxiety state 12/19/2008  . Atrial fibrillation ([place]) 12/19/2008  . GERD 12/19/2008  . Abnormal involuntary movement 12/19/2008    Past Surgical History:  Procedure Laterality Date  . APPENDECTOMY    . CARDIAC CATHETERIZATION     stents x2.  Marland Kitchen CATARACT EXTRACTION    . CHOLECYSTECTOMY    . COLONOSCOPY  2011  . COLONOSCOPY N/A 10/19/2014   Procedure: COLONOSCOPY;  Surgeon: Rogene Houston, MD;  Location: [facility] ENDO SUITE;  Service: Endoscopy;  Laterality: N/A;  1030  . ESOPHAGOGASTRODUODENOSCOPY N/A 10/26/2014   Procedure: ESOPHAGOGASTRODUODENOSCOPY (EGD);  Surgeon: Rogene Houston, MD;  Location: [facility] ENDO SUITE;  Service: Endoscopy;  Laterality: N/A;  230 - Dr. has lunch and learn  . Esophagogastroduodenoscopy with esophageal dilation  2004, 2006, 2007  . GIVENS CAPSULE STUDY N/A 10/31/2014   Procedure: GIVENS CAPSULE STUDY;  Surgeon: Rogene Houston, MD;  Location: [facility] ENDO SUITE;  Service: Endoscopy;  Laterality: N/A;  730 -- pacemaker--needs monitoring--outpatient bed  . INSERT / REPLACE / REMOVE PACEMAKER    . LEFT HEART CATHETERIZATION WITH CORONARY ANGIOGRAM N/A 11/17/2011   Procedure: LEFT HEART CATHETERIZATION WITH CORONARY ANGIOGRAM;  Surgeon: Sherren Mocha, MD;  Location: [facility] CATH LAB;  Service: Cardiovascular;  Laterality: N/A;  .  LEFT HEART CATHETERIZATION WITH CORONARY ANGIOGRAM N/A 09/26/2014   Procedure: LEFT HEART CATHETERIZATION WITH CORONARY ANGIOGRAM;  Surgeon: Leonie Man, MD;  Location: [facility] CATH LAB;  Service: Cardiovascular;  Laterality: N/A;  . Right rotator cuff repair    . SHOULDER ACROMIOPLASTY Right 05/30/2015   Procedure: RIGHT SHOULDER ACROMIOPLASTY;  Surgeon: Carole Civil, MD;  Location: [facility] ORS;  Service: Orthopedics;  Laterality: Right;  . SHOULDER OPEN ROTATOR CUFF  REPAIR Right 05/30/2015   Procedure: OPEN ROTATOR CUFF REPAIR RIGHT SHOULDER;  Surgeon: Carole Civil, MD;  Location: [facility] ORS;  Service: Orthopedics;  Laterality: Right;  . Subxiphoid pericardial window  11/10  . VAGINAL HYSTERECTOMY      OB History    Gravida Para Term Preterm AB Living   6 5 5   1 3    SAB TAB Ectopic Multiple Live Births   1               Home Medications    Prior to Admission medications   Medication Sig Start Date End Date Taking? Authorizing Provider  ALPRAZolam Duanne Moron) 1 MG tablet Take 0.5-1 mg by mouth at bedtime.    Yes Historical Provider, MD  amiodarone (PACERONE) 100 MG tablet Take 100 mg by mouth daily.   Yes Historical Provider, MD  aspirin 81 MG tablet Take 81 mg by mouth daily.   Yes Historical Provider, MD  clopidogrel (PLAVIX) 75 MG tablet Take 75 mg by mouth.    Yes Historical Provider, MD  Cyanocobalamin (B-12 PO) Take 1 tablet by mouth daily.    Yes Historical Provider, MD  esomeprazole (NEXIUM) 40 MG capsule Take 1 po BID x 2 weeks then once a day Patient taking differently: Take 40 mg by mouth daily as needed (for acid reflux/GERD).  03/05/15  Yes Rolland Porter, MD  furosemide (LASIX) 40 MG tablet Take 20-40 mg by mouth daily.  12/04/11  Yes Satira Sark, MD  metoprolol succinate (TOPROL XL) 25 MG 24 hr tablet Take 3 tablets (75 mg total) by mouth daily. 03/24/16  Yes Satira Sark, MD  nitroGLYCERIN (NITROSTAT) 0.4 MG SL tablet Place 0.4 mg under the tongue every 5 (five) minutes as needed for chest pain.    Yes Historical Provider, MD  potassium chloride SA (K-DUR,KLOR-CON) 20 MEQ tablet Take 20-40 mEq by mouth daily.    Yes Rexene Alberts, MD  primidone (MYSOLINE) 50 MG tablet Take 50 mg by mouth 2 (two) times daily as needed (for tremors).  10/31/10  Yes Historical Provider, MD  promethazine (PHENERGAN) 12.5 MG tablet Take 1 tablet (12.5 mg total) by mouth every 6 (six) hours as needed for nausea or vomiting. 06/04/15  Yes Carole Civil, MD  thiamine (VITAMIN B-1) 100 MG tablet Take 100 mg by mouth daily.   Yes Historical Provider, MD  valsartan (DIOVAN) 160 MG tablet Take 1 tablet (160 mg total) by mouth daily. 03/24/16  Yes Satira Sark, MD  vitamin E 400 UNIT capsule Take 400 Units by mouth daily.   Yes Historical Provider, MD    Family History Family History  Problem Relation Age of Onset  . Cancer Mother     Colon   . Coronary artery disease Sister   . Coronary artery disease Brother   . Arthritis    . Lung disease    . Asthma      Social History Social History  Substance Use Topics  . Smoking status: Former Smoker  Packs/day: 2.00    Years: 10.00    Types: Cigarettes    Quit date: 05/23/1989  . Smokeless tobacco: Never Used  . Alcohol use No     Allergies   Amitriptyline hcl and Sulfonamide derivatives   Review of Systems Review of Systems  All other systems reviewed and are negative.    Physical Exam Updated Vital Signs BP 171/95   Pulse 67   Temp 97.6 F (36.4 C) (Oral)   Resp 26   Ht 5' 5.6" (1.666 m)   Wt 156 lb (70.8 kg)   SpO2 98%   BMI 25.49 kg/m   Physical Exam  Constitutional: She is oriented to person, place, and time. She appears well-developed.  Elderly, frail  HENT:  Head: Normocephalic and atraumatic.  Eyes: Conjunctivae and EOM are normal. Pupils are equal, round, and reactive to light.  Neck: Normal range of motion and phonation normal. Neck supple.  Cardiovascular: Normal rate and regular rhythm.   Pulmonary/Chest: Effort normal. She exhibits no tenderness.  Decreased air movement bilaterally, but no audible wheezes, Rales or rhonchi. There is no increased work of breathing.  Abdominal: Soft. She exhibits no distension. There is no tenderness. There is no guarding.  Musculoskeletal: Normal range of motion. She exhibits no edema or deformity.  Neurological: She is alert and oriented to person, place, and time. She exhibits normal muscle tone.    Nonspecific, and variable tremor.  Skin: Skin is warm and dry.  Psychiatric: She has a normal mood and affect. Her behavior is normal. Judgment and thought content normal.  Nursing note and vitals reviewed.    ED Treatments / Results  Labs (all labs ordered are listed, but only abnormal results are displayed) Labs Reviewed  CBC WITH DIFFERENTIAL/PLATELET  COMPREHENSIVE METABOLIC PANEL  TROPONIN I    EKG  EKG Interpretation  Date/Time:  Tuesday June 17 2016 20:24:46 EST Ventricular Rate:  65 PR Interval:    QRS Duration: 88 QT Interval:  432 QTC Calculation: 450 R Axis:   47 Text Interpretation:  Atrial-paced rhythm Since last tracing atrial pacing is now present Confirmed by Eulis Foster  MD, Cleatis Fandrich [phone]) on 06/17/2016 9:37:09 PM       Radiology Dg Chest 2 View  Result Date: 06/17/2016 CLINICAL DATA:  Acute onset of intermittent shortness of breath. Left arm pain. Initial encounter. EXAM: CHEST  2 VIEW COMPARISON:  Chest radiograph performed 03/05/2015 FINDINGS: The lungs are well-aerated. Minimal bibasilar atelectasis is noted. There is no evidence of pleural effusion or pneumothorax. The heart is mildly enlarged. A pacemaker is noted at the left chest wall, with leads ending at the right atrium and right ventricle. No acute osseous abnormalities are seen. The patient is status post right-sided rotator cuff repair. IMPRESSION: Minimal bibasilar atelectasis noted.  Mild cardiomegaly. Electronically Signed   By: Garald Balding M.D.   On: 06/17/2016 20:54    Procedures Procedures (including critical care time)  Medications Ordered in ED Medications - No data to display   Initial Impression / Assessment and Plan / ED Course  I have reviewed the triage vital signs and the nursing notes.  Pertinent labs & imaging results that were available during my care of the patient were reviewed by me and considered in my medical decision making (see chart for details).  Clinical  Course     Medications - No data to display  Patient Vitals for the past 24 hrs:  BP Temp Temp src Pulse Resp SpO2 Height Weight  06/17/16 2130 171/95 - - 67 26 98 % - -  06/17/16 2100 166/88 - - 66 - 97 % - -  06/17/16 2030 195/82 - - 61 24 98 % - -  06/17/16 2010 - - - - - - 5' 5.6" (1.666 m) 156 lb (70.8 kg)  06/17/16 2009 176/96 97.6 F (36.4 C) Oral 82 18 100 % - -    9:59 PM Reevaluation with update and discussion. After initial assessment and treatment, an updated evaluation reveals No change in clinical status. Findings discussed with patient and all questions answered. Apryle Stowell L    Final Clinical Impressions(s) / ED Diagnoses   Final diagnoses:  Dyspnea, unspecified type   Nonspecific dyspnea, most likely related to the patient being away for over several hours without her oxygen. No evidence for pneumonia, ACS, PE, or impending vascular collapse.  Nursing Notes Reviewed/ Care Coordinated Applicable Imaging Reviewed Interpretation of Laboratory Data incorporated into ED treatment  The patient appears reasonably screened and/or stabilized for discharge and I doubt any other medical condition or other [facility] requiring further screening, evaluation, or treatment in the ED at this time prior to discharge.  Plan: Home Medications- continue; Home Treatments- rest; return here if the recommended treatment, does not improve the symptoms; Recommended follow up- PCP 1 week and prn  New Prescriptions New Prescriptions   No medications on file     Daleen Bo, MD 06/17/16 2202

## 2016-06-17 NOTE — ED Notes (Signed)
Pt carried back upstairs to sit w/ her daughter who was admitted.

## 2016-06-17 NOTE — ED Triage Notes (Signed)
Pt reports SOB which she states she has intermittently since last May. Pt states she will "have a spell and then it will get better." Pt also c/o L arm pain which she states she also has intermittently. Pt reports the arm pain she has now started yesterday.Pt denies chest pain.

## 2016-06-25 NOTE — Progress Notes (Deleted)
Cardiology Office Note  Date: 06/25/2016   ID: [name], DOB October 18, 1935, MRN [id number]  PCP: Wende Neighbors, MD  Primary Cardiologist: Rozann Lesches, MD   No chief complaint on file.   History of Present Illness: [name] is a 80 y.o. female last seen in August.  Record review finds recent ER visit on November 14 with shortness of breath, overall chronic, and in the absence of oxygen supplementation which she uses intermittently. Chest x-ray showed minimal bibasilar atelectasis with mild cardiomegaly. ECG was nonacute. Troponin I level was normal.  She continues to follow with Dr. Lovena Le in the device clinic, Medtronic pacemaker in place.  Past Medical History:  Diagnosis Date  . Anemia    history  . Anxiety   . Atrial fibrillation   . Chronic back pain   . COPD (chronic obstructive pulmonary disease) ([place])   . Coronary atherosclerosis of native coronary artery    DES x 2 to RCA 10/10  . 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 Essential hypertension, benign   . GERD (gastroesophageal reflux disease)   . H/O hiatal hernia   . Headache(784.0)   . HOH (hard of hearing)   . Hyperlipidemia   . Neuromuscular disorder ([place])    Tremors  . Pericardial effusion    Hemorrhagic   . Presence of permanent cardiac pacemaker   . Resting tremor   . Tachycardia-bradycardia syndrome ([place])    s/p Medtronic Adapta L dual chamber device  5/10    Past Surgical History:  Procedure Laterality Date  . APPENDECTOMY    . CARDIAC CATHETERIZATION     stents x2.  Marland Kitchen CATARACT EXTRACTION    . CHOLECYSTECTOMY    . COLONOSCOPY  2011  . COLONOSCOPY N/A 10/19/2014   Procedure: COLONOSCOPY;  Surgeon: Rogene Houston, MD;  [facility];  Service: Endoscopy;  Laterality: N/A;  1030  . ESOPHAGOGASTRODUODENOSCOPY N/A 10/26/2014   Procedure: ESOPHAGOGASTRODUODENOSCOPY (EGD);  Surgeon: Rogene Houston, MD;  [facility];  Service: Endoscopy;   Laterality: N/A;  230 - Dr. has lunch and learn  . Esophagogastroduodenoscopy with esophageal dilation  2004, 2006, 2007  . GIVENS CAPSULE STUDY N/A 10/31/2014   Procedure: GIVENS CAPSULE STUDY;  Surgeon: Rogene Houston, MD;  [facility];  Service: Endoscopy;  Laterality: N/A;  730 -- pacemaker--needs monitoring--outpatient bed  . INSERT / REPLACE / REMOVE PACEMAKER    . LEFT HEART CATHETERIZATION WITH CORONARY ANGIOGRAM N/A 11/17/2011   Procedure: LEFT HEART CATHETERIZATION WITH CORONARY ANGIOGRAM;  Surgeon: Sherren Mocha, MD;  Location: [facility] CATH LAB;  Service: Cardiovascular;  Laterality: N/A;  . LEFT HEART CATHETERIZATION WITH CORONARY ANGIOGRAM N/A 09/26/2014   Procedure: LEFT HEART CATHETERIZATION WITH CORONARY ANGIOGRAM;  Surgeon: Leonie Man, MD;  Location: [facility] C[facility] LAB;  Service: Cardiovascular;  Laterality: N/A;  . Right rotator cuff repair    . SHOULDER ACROMIOPLASTY Right 05/30/2015   Procedure: RIGHT SHOULDER ACROMIOPLASTY;  Surgeon: Carole Civil, MD;  Location: [facility] ORS;  Service: Orthopedics;  Laterality: Right;  . SHOULDER OPEN ROTATOR CUFF REPAIR Right 05/30/2015   Procedure: OPEN ROTATOR CUFF REPAIR RIGHT SHOULDER;  Surgeon: Carole Civil, MD;  Location: [facility] ORS;  Service: Orthopedics;  Laterality: Right;  . Subxiphoid pericardial window  11/10  . VAGINAL HYSTERECTOMY      Current Outpatient Prescriptions  Medication Sig Dispense Refill  . ALPRAZolam (XANAX) 1 MG tablet Take 0.5-1 mg by  mouth at bedtime.     Marland Kitchen amiodarone (PACERONE) 100 MG tablet Take 100 mg by mouth daily.    Marland Kitchen aspirin 81 MG tablet Take 81 mg by mouth daily.    . clopidogrel (PLAVIX) 75 MG tablet Take 75 mg by mouth.     . Cyanocobalamin (B-12 PO) Take 1 tablet by mouth daily.     Marland Kitchen esomeprazole (NEXIUM) 40 MG capsule Take 1 po BID x 2 weeks then once a day (Patient taking differently: Take 40 mg by mouth daily as needed (for acid reflux/GERD). ) 60 capsule 0  . furosemide (LASIX) 40 MG  tablet Take 20-40 mg by mouth daily.     . metoprolol succinate (TOPROL XL) 25 MG 24 hr tablet Take 3 tablets (75 mg total) by mouth daily. 90 tablet 3  . nitroGLYCERIN (NITROSTAT) 0.4 MG SL tablet Place 0.4 mg under the tongue every 5 (five) minutes as needed for chest pain.     . potassium chloride SA (K-DUR,KLOR-CON) 20 MEQ tablet Take 20-40 mEq by mouth daily.     . primidone (MYSOLINE) 50 MG tablet Take 50 mg by mouth 2 (two) times daily as needed (for tremors).     . promethazine (PHENERGAN) 12.5 MG tablet Take 1 tablet (12.5 mg total) by mouth every 6 (six) hours as needed for nausea or vomiting. 60 tablet 1  . thiamine (VITAMIN B-1) 100 MG tablet Take 100 mg by mouth daily.    . valsartan (DIOVAN) 160 MG tablet Take 1 tablet (160 mg total) by mouth daily. 90 tablet 1  . vitamin E 400 UNIT capsule Take 400 Units by mouth daily.     No current facility-administered medications for this visit.    Allergies:  Amitriptyline hcl and Sulfonamide derivatives   Social History: The patient  reports that she quit smoking about 27 years ago. Her smoking use included Cigarettes. She has a 20.00 pack-year smoking history. She has never used smokeless tobacco. She reports that she does not drink alcohol or use drugs.   Family History: The patient's family history includes Cancer in her mother; Coronary artery disease in her brother and sister.   ROS:  Please see the history of present illness. Otherwise, complete review of systems is positive for {NONE DEFAULTED:18576::"none"}.  All other systems are reviewed and negative.   Physical Exam: VS:  There were no vitals taken for this visit., BMI There is no height or weight on file to calculate BMI.  Wt Readings from Last 3 Encounters:  06/17/16 156 lb (70.8 kg)  04/21/16 156 lb (70.8 kg)  03/24/16 156 lb (70.8 kg)    General: No distress. HEENT: Conjunctiva and lids normal, oropharynx clear. Neck: Supple, no elevated JVP or carotid bruits, no  thyromegaly. Lungs: Clear to auscultation, nonlabored breathing at rest. Cardiac: Regular rate and rhythm, no S3, soft systolic murmur, no pericardial rub. Abdomen: Soft, nontender, bowel sounds present, no guarding or rebound. Extremities: Trace ankle edema, distal pulses 2+. Skin: Warm and dry. Musculoskeletal: Mild kyphosis. Neuropsychiatric: Alert and oriented x3, affect grossly appropriate. Hard of hearing. Resting tremor.  ECG: I personally reviewed the tracing from 06/17/2016 which showed an atrial paced rhythm.  Recent Labwork: 06/17/2016: ALT 17; AST 21; BUN 11; Creatinine, Ser 0.67; Hemoglobin 12.8; Platelets 210; Potassium 3.5; Sodium 141     Component Value Date/Time   CHOL 242 (H) 11/15/2011 0640   TRIG 152 (H) 11/15/2011 0640   HDL 46 11/15/2011 0640   CHOLHDL 5.3 11/15/2011  0640   VLDL 30 11/15/2011 0640   LDLCALC 166 (H) 11/15/2011 0640    Other Studies Reviewed Today:  Cardiac catheterization 09/26/2014: FINDINGS:  Hemodynamics:   Central Aortic Pressure / Mean: 112/50/74 mmHg  Left Ventricular Pressure / LVEDP: 111/3/12 mmHg  Left Ventriculography:  EF:55-60 %  Wall Motion:Overall normal wall motion with a somewhat globular appearing apex  Coronary Anatomy:  Dominance: Right  Left Main: Normal caliber vessel with moderate calcification. It bifurcates into the LAD And Circumflex. No significant stenoses. LAD: Normal caliber vessel with proximal roughly 30% calcified stenosis. There is also a focal 40% stenosis at the takeoff of D2. Distally there is 50% stenosis. There are 4 diagonal branches with the first to be moderate caliber second tubing of the small caliber. Minimal disease noted. The distal LAD does not reach all the way to the apex.  Left Circumflex: Normal caliber, nondominant vessel. It gives off a OM1 from the mid vessel. After the OM1 there is a tubular moderate 30-40% stenosis before the vessel bifurcates into a small AV groove branch  and OM 2.  OM1:Small-moderate caliber vessel with diffuse 50% stenoses. No significant change from prior catheterization.  OM 2:Small- moderate caliber vessel that bifurcates distally into 2 small caliber branches. Angiographically normal.   RCA: Large-caliber, dominant vessel with 2 patent stents in the proximal segment. There is diffuse tubular 40% stenosis just prior to the crux. The vessel then normalizes distally as it bifurcates into the Right Posterior AV Groove Branch (RPAV) and the Right Posterior Descending Artery (RPDA).  RPDA: Moderate-large-caliber vessel that is extensive and reaches to the apex and around and to the anteroapex. The vessel is somewhat tortuous but no significant disease.  RPL Sysytem:The RPAV begins as a moderate large-caliber vessel that terminates as one major moderate caliber posterolateral branch with several smaller branches.  Echocardiogram 12/10/2015: Study Conclusions  - Left ventricle: The cavity size was normal. Wall thickness was increased in a pattern of moderate LVH. There was focal basal hypertrophy. Systolic function was normal. The estimated ejection fraction was in the range of 60% to 65%. Wall motion was normal; there were no regional wall motion abnormalities. Features are consistent with a pseudonormal left ventricular filling pattern, with concomitant abnormal relaxation and increased filling pressure (grade 2 diastolic dysfunction). Doppler parameters are consistent with high ventricular filling pressure. - Aortic valve: Moderately calcified annulus. Trileaflet; moderately thickened leaflets. There was mild regurgitation. Valve area (VTI): 2.32 cm^2. Valve area (Vmax): 2.3 cm^2. Regurgitation pressure half-time: 633 ms. - Mitral valve: Moderately to severely calcified annulus. Mildly thickened leaflets . There was mild regurgitation. - Left atrium: The atrium was severely dilated. - Right ventricle: The  cavity size was mildly dilated. - Right atrium: The atrium was severely dilated. - Pulmonary arteries: Systolic pressure was mildly increased. PA peak pressure: 36 mm Hg (S). - Technically adequate study.  Assessment and Plan:   Current medicines were reviewed with the patient today.  No orders of the defined types were placed in this encounter.   Disposition:  Signed, Satira Sark, MD, [facility] 06/25/2016 9:55 AM    [facility] [place]. [address] [place], [place] [address] Phone: [phone]; Fax: [phone]

## 2016-06-30 ENCOUNTER — Ambulatory Visit: Payer: Self-pay | Admitting: Cardiology

## 2016-07-04 DIAGNOSIS — I10 Essential (primary) hypertension: Secondary | ICD-10-CM | POA: Diagnosis not present

## 2016-07-04 DIAGNOSIS — R0602 Shortness of breath: Secondary | ICD-10-CM | POA: Diagnosis not present

## 2016-07-07 ENCOUNTER — Encounter: Payer: Self-pay | Admitting: Cardiology

## 2016-07-07 ENCOUNTER — Ambulatory Visit (INDEPENDENT_AMBULATORY_CARE_PROVIDER_SITE_OTHER): Payer: Medicare Other | Admitting: Cardiology

## 2016-07-07 VITALS — BP 172/85 | HR 71 | Ht 65.5 in | Wt 161.2 lb

## 2016-07-07 DIAGNOSIS — I2510 Atherosclerotic heart disease of native coronary artery without angina pectoris: Secondary | ICD-10-CM | POA: Diagnosis not present

## 2016-07-07 DIAGNOSIS — Z950 Presence of cardiac pacemaker: Secondary | ICD-10-CM

## 2016-07-07 DIAGNOSIS — I10 Essential (primary) hypertension: Secondary | ICD-10-CM

## 2016-07-07 DIAGNOSIS — I480 Paroxysmal atrial fibrillation: Secondary | ICD-10-CM

## 2016-07-07 MED ORDER — METOPROLOL SUCCINATE ER 25 MG PO TB24: 50.0000 mg | ORAL_TABLET | Freq: Two times a day (BID) | ORAL | 11 refills | Status: DC

## 2016-07-07 NOTE — Progress Notes (Signed)
Cardiology Office Note  Date: 07/07/2016   [name], DOB [birth year], MRN [id number]  PCP: Wende Neighbors, MD  Primary Cardiologist: Rozann Lesches, MD   Chief Complaint  Patient presents with  . Coronary Artery Disease    History of Present Illness: [name] is a 80 y.o. female last seen in August. Interval records reviewed, she was seen in the ER in November for shortness of breath, had been off of her oxygen at that time, no acute process by chest x-ray.She comes in today for a follow-up visit, overall no major change. She has been under a lot of stress, takes care of her daughter who has chronic illness. She does not report any increasing angina symptoms. Does feel like her heart "runs away" at times.  She continues to follow in the device clinic with Dr. Lovena Le, Medtronic pacemaker in place. Recent device interrogation in November demonstrated no mode switches or high ventricular rates.  I reviewed her medications which are outlined below. Norvasc was most recent addition, she is taking this at nighttime. We discussed further advancing her Toprol-XL to 59 g twice daily.  Most recent cardiac structural and ischemic testing is outlined below.  Past Medical History:  Diagnosis Date  . Anemia    history  . Anxiety   . Atrial fibrillation   . Chronic back pain   . COPD (chronic obstructive pulmonary disease) ([place])   . Coronary atherosclerosis of native coronary artery    DES x 2 to RCA 10/10  . 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 Essential hypertension, benign   . GERD (gastroesophageal reflux disease)   . H/O hiatal hernia   . Headache(784.0)   . HOH (hard of hearing)   . Hyperlipidemia   . Neuromuscular disorder ([place])    Tremors  . Pericardial effusion    Hemorrhagic   . Presence of permanent cardiac pacemaker   . Resting tremor   . Tachycardia-bradycardia syndrome ([place])    s/p Medtronic Adapta L dual chamber device  5/10     Past Surgical History:  Procedure Laterality Date  . APPENDECTOMY    . CARDIAC CATHETERIZATION     stents x2.  Marland Kitchen CATARACT EXTRACTION    . CHOLECYSTECTOMY    . COLONOSCOPY  2011  . COLONOSCOPY N/A 10/19/2014   Procedure: COLONOSCOPY;  Surgeon: Rogene Houston, MD;  [facility];  Service: Endoscopy;  Laterality: N/A;  1030  . ESOPHAGOGASTRODUODENOSCOPY N/A 10/26/2014   Procedure: ESOPHAGOGASTRODUODENOSCOPY (EGD);  Surgeon: Rogene Houston, MD;  [facility];  Service: Endoscopy;  Laterality: N/A;  230 - Dr. has lunch and learn  . Esophagogastroduodenoscopy with esophageal dilation  2004, 2006, 2007  . GIVENS CAPSULE STUDY N/A 10/31/2014   Procedure: GIVENS CAPSULE STUDY;  Surgeon: Rogene Houston, MD;  [facility];  Service: Endoscopy;  Laterality: N/A;  730 -- pacemaker--needs monitoring--outpatient bed  . INSERT / REPLACE / REMOVE PACEMAKER    . LEFT HEART CATHETERIZATION WITH CORONARY ANGIOGRAM N/A 11/17/2011   Procedure: LEFT HEART CATHETERIZATION WITH CORONARY ANGIOGRAM;  Surgeon: Sherren Mocha, MD;  Location: [facility] CATH LAB;  Service: Cardiovascular;  Laterality: N/A;  . LEFT HEART CATHETERIZATION WITH CORONARY ANGIOGRAM N/A 09/26/2014   Procedure: LEFT HEART CATHETERIZATION WITH CORONARY ANGIOGRAM;  Surgeon: Leonie Man, MD;  Location: [facility] CATH LAB;  Service: Cardiovascular;  Laterality: N/A;  . Right rotator cuff repair    . SHOULDER ACROMIOPLASTY  Right 05/30/2015   Procedure: RIGHT SHOULDER ACROMIOPLASTY;  Surgeon: Carole Civil, MD;  Location: [facility] ORS;  Service: Orthopedics;  Laterality: Right;  . SHOULDER OPEN ROTATOR CUFF REPAIR Right 05/30/2015   Procedure: OPEN ROTATOR CUFF REPAIR RIGHT SHOULDER;  Surgeon: Carole Civil, MD;  Location: [facility] ORS;  Service: Orthopedics;  Laterality: Right;  . Subxiphoid pericardial window  11/10  . VAGINAL HYSTERECTOMY      Current Outpatient Prescriptions  Medication Sig Dispense Refill  .  ALPRAZolam (XANAX) 1 MG tablet Take 0.5-1 mg by mouth at bedtime.     Marland Kitchen amiodarone (PACERONE) 100 MG tablet Take 100 mg by mouth daily.    Marland Kitchen amLODipine (NORVASC) 5 MG tablet Take 5 mg by mouth at bedtime.    Marland Kitchen aspirin 81 MG tablet Take 81 mg by mouth daily.    . clopidogrel (PLAVIX) 75 MG tablet Take 75 mg by mouth.     . Cyanocobalamin (B-12 PO) Take 1 tablet by mouth daily.     Marland Kitchen esomeprazole (NEXIUM) 40 MG capsule Take 40 mg by mouth as needed (acid reflux).    . furosemide (LASIX) 40 MG tablet Take 20-40 mg by mouth daily.     . metoprolol succinate (TOPROL XL) 25 MG 24 hr tablet Take 3 tablets (75 mg total) by mouth daily. 90 tablet 3  . nitroGLYCERIN (NITROSTAT) 0.4 MG SL tablet Place 0.4 mg under the tongue every 5 (five) minutes as needed for chest pain.     . potassium chloride SA (K-DUR,KLOR-CON) 20 MEQ tablet Take 20-40 mEq by mouth daily.     . primidone (MYSOLINE) 50 MG tablet Take 50 mg by mouth 2 (two) times daily as needed (for tremors).     . promethazine (PHENERGAN) 12.5 MG tablet Take 1 tablet (12.5 mg total) by mouth every 6 (six) hours as needed for nausea or vomiting. 60 tablet 1  . thiamine (VITAMIN B-1) 100 MG tablet Take 100 mg by mouth daily.    . valsartan (DIOVAN) 160 MG tablet Take 1 tablet (160 mg total) by mouth daily. 90 tablet 1  . vitamin E 400 UNIT capsule Take 400 Units by mouth daily.     No current facility-administered medications for this visit.    Allergies:  Amitriptyline hcl and Sulfonamide derivatives   Social History: The patient  reports that she quit smoking about 27 years ago. Her smoking use included Cigarettes. She has a 20.00 pack-year smoking history. She has never used smokeless tobacco. She reports that she does not drink alcohol or use drugs.   ROS:  Please see the history of present illness. Otherwise, complete review of systems is positive for Hearing loss.  All other systems are reviewed and negative.   Physical Exam: VS:  BP (!)  172/85   Pulse 71   Ht 5' 5.5" (1.664 m)   Wt 161 lb 3.2 oz (73.1 kg)   BMI 26.42 kg/m , BMI Body mass index is 26.42 kg/m.  Wt Readings from Last 3 Encounters:  07/07/16 161 lb 3.2 oz (73.1 kg)  06/17/16 156 lb (70.8 kg)  04/21/16 156 lb (70.8 kg)    General: Elderly woman, no distress. HEENT: Conjunctiva and lids normal, oropharynx clear. Neck: Supple, no elevated JVP or carotid bruits, no thyromegaly. Lungs: Clear to auscultation, nonlabored breathing at rest. Cardiac: Regular rate and rhythm, no S3, soft systolic murmur, no pericardial rub. Abdomen: Soft, nontender, bowel sounds present, no guarding or rebound. Extremities: Trace ankle edema, distal  pulses 2+. Skin: Warm and dry. Musculoskeletal: Mild kyphosis. Neuropsychiatric: Alert and oriented x3, affect grossly appropriate. Hard of hearing. Resting tremor.  ECG: I personally reviewed the tracing from 06/17/2016 which showed an atrial paced rhythm.   Recent Labwork: 06/17/2016: ALT 17; AST 21; BUN 11; Creatinine, Ser 0.67; Hemoglobin 12.8; Platelets 210; Potassium 3.5; Sodium 141     Component Value Date/Time   CHOL 242 (H) 11/15/2011 0640   TRIG 152 (H) 11/15/2011 0640   HDL 46 11/15/2011 0640   CHOLHDL 5.3 11/15/2011 0640   VLDL 30 11/15/2011 0640   LDLCALC 166 (H) 11/15/2011 0640    Other Studies Reviewed Today:  Echocardiogram 12/10/2015: Study Conclusions  - Left ventricle: The cavity size was normal. Wall thickness was   increased in a pattern of moderate LVH. There was focal basal   hypertrophy. Systolic function was normal. The estimated ejection   fraction was in the range of 60% to 65%. Wall motion was normal;   there were no regional wall motion abnormalities. Features are   consistent with a pseudonormal left ventricular filling pattern,   with concomitant abnormal relaxation and increased filling   pressure (grade 2 diastolic dysfunction). Doppler parameters are   consistent with high ventricular  filling pressure. - Aortic valve: Moderately calcified annulus. Trileaflet;   moderately thickened leaflets. There was mild regurgitation.   Valve area (VTI): 2.32 cm^2. Valve area (Vmax): 2.3 cm^2.   Regurgitation pressure half-time: 633 ms. - Mitral valve: Moderately to severely calcified annulus. Mildly   thickened leaflets . There was mild regurgitation. - Left atrium: The atrium was severely dilated. - Right ventricle: The cavity size was mildly dilated. - Right atrium: The atrium was severely dilated. - Pulmonary arteries: Systolic pressure was mildly increased. PA   peak pressure: 36 mm Hg (S). - Technically adequate study.  Chest x-ray 06/17/2016: FINDINGS: The lungs are well-aerated. Minimal bibasilar atelectasis is noted. There is no evidence of pleural effusion or pneumothorax.  The heart is mildly enlarged. A pacemaker is noted at the left chest wall, with leads ending at the right atrium and right ventricle. No acute osseous abnormalities are seen. The patient is status post right-sided rotator cuff repair.  IMPRESSION: Minimal bibasilar atelectasis noted.  Mild cardiomegaly.  Lexiscan Myoview 08/25/2014: IMPRESSION: 1. Small region of scar with mild peri-infarct ischemia in the inferolateral wall.  2. Normal left ventricular wall motion.  3. Left ventricular ejection fraction 56%  4. Low-risk stress test findings*.  Cardiac catheterization 09/26/2014: FINDINGS:  Hemodynamics:   Central Aortic Pressure / Mean: 112/50/74 mmHg  Left Ventricular Pressure / LVEDP: 111/3/12 mmHg  Left Ventriculography:  EF: 55-60 %  Wall Motion: Overall normal wall motion with a somewhat globular appearing apex  Coronary Anatomy:  Dominance: Right  Left Main: Normal caliber vessel with moderate calcification. It bifurcates into the LAD And Circumflex. No significant stenoses. LAD: Normal caliber vessel with proximal roughly 30% calcified stenosis. There is also a  focal 40% stenosis at the takeoff of D2. Distally there is 50% stenosis. There are 4 diagonal branches with the first to be moderate caliber second tubing of the small caliber. Minimal disease noted. The distal LAD does not reach all the way to the apex.  Left Circumflex: Normal caliber, nondominant vessel. It gives off a OM1 from the mid vessel. After the OM1 there is a tubular moderate 30-40% stenosis before the vessel bifurcates into a small AV groove branch and OM 2.  OM1: Small-moderate caliber vessel with  diffuse 50% stenoses. No significant change from prior catheterization.  OM 2: Small- moderate caliber vessel that bifurcates distally into 2 small caliber branches. Angiographically normal.   RCA: Large-caliber, dominant vessel with 2 patent stents in the proximal segment. There is diffuse tubular 40% stenosis just prior to the crux. The vessel then normalizes distally as it bifurcates into the Right Posterior AV Groove Branch (RPAV) and the Right Posterior Descending Artery (RPDA).  RPDA: Moderate-large-caliber vessel that is extensive and reaches to the apex and around and to the anteroapex. The vessel is somewhat tortuous but no significant disease.  RPL Sysytem:The RPAV begins as a moderate large-caliber vessel that terminates as one major moderate caliber posterolateral branch with several smaller branches.  Assessment and Plan:  1. CAD status post DES to the RCA in 2010. Coronary anatomy was overall stable by cardiac catheterization last year.. Plan to continue current medical regimen. Since she is reporting intermittent palpitations, we will increase her Toprol-XL to 50 mg twice daily.  2. Essential hypertension, Norvasc was recently added by PCP. Agree with this choice.  3. Tachycardia-bradycardia syndrome status post Medtronic pacemaker placement. Recent device interrogation reviewed. She continues to follow with Dr. Lovena Le.  4. History of paroxysmal atrial fibrillation,  reports palpitations but no high ventricular rates or mode switches by device interrogation. She remains on low-dose amiodarone and beta blocker, not anticoagulated with prior history of hemorrhagic pericardial effusion.  Current medicines were reviewed with the patient today.  Disposition: Follow-up in 4 months.  Signed, Satira Sark, MD, [facility] 07/07/2016 2:53 PM    [place] at [facility] [address], [place], [place] [address] Phone: [phone]; Fax: [phone]

## 2016-07-07 NOTE — Patient Instructions (Signed)
Your physician recommends that you schedule a follow-up appointment in:  4 Months with Dr. Domenic Polite.   Your physician has recommended you make the following change in your medication:   Increase Metoprolol to 50 mg Two Times Daily   If you need a refill on your cardiac medications before your next appointment, please call your pharmacy.  Thank you for choosing [facility]!

## 2016-07-09 DIAGNOSIS — I509 Heart failure, unspecified: Secondary | ICD-10-CM | POA: Diagnosis not present

## 2016-07-15 ENCOUNTER — Ambulatory Visit: Payer: Self-pay | Admitting: Orthopedic Surgery

## 2016-07-22 ENCOUNTER — Ambulatory Visit (INDEPENDENT_AMBULATORY_CARE_PROVIDER_SITE_OTHER): Payer: Medicare Other | Admitting: Internal Medicine

## 2016-07-22 VITALS — BP 110/80 | HR 74 | Temp 97.8°F | Resp 20 | Ht 66.5 in | Wt 162.8 lb

## 2016-07-22 DIAGNOSIS — Z862 Personal history of diseases of the blood and blood-forming organs and certain disorders involving the immune mechanism: Secondary | ICD-10-CM | POA: Diagnosis not present

## 2016-07-22 DIAGNOSIS — R112 Nausea with vomiting, unspecified: Secondary | ICD-10-CM

## 2016-07-22 DIAGNOSIS — K219 Gastro-esophageal reflux disease without esophagitis: Secondary | ICD-10-CM

## 2016-07-22 MED ORDER — ONDANSETRON HCL 4 MG PO TABS: 4.0000 mg | ORAL_TABLET | Freq: Two times a day (BID) | ORAL | 0 refills | Status: DC | PRN

## 2016-07-22 MED ORDER — FLINTSTONES PLUS IRON PO CHEW: 1.0000 | CHEWABLE_TABLET | Freq: Every day | ORAL | Status: DC
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
## 2016-07-22 NOTE — Patient Instructions (Signed)
Flintstone chewable with iron 1 tablet daily. Call if you have rectal bleeding or tarry stool.

## 2016-07-23 ENCOUNTER — Encounter (INDEPENDENT_AMBULATORY_CARE_PROVIDER_SITE_OTHER): Payer: Self-pay | Admitting: Internal Medicine

## 2016-08-09 DIAGNOSIS — I509 Heart failure, unspecified: Secondary | ICD-10-CM | POA: Diagnosis not present

## 2016-08-11 ENCOUNTER — Ambulatory Visit: Payer: Self-pay | Admitting: Orthopedic Surgery

## 2016-08-25 ENCOUNTER — Ambulatory Visit (INDEPENDENT_AMBULATORY_CARE_PROVIDER_SITE_OTHER): Payer: Medicare Other | Admitting: Orthopedic Surgery

## 2016-08-25 ENCOUNTER — Ambulatory Visit (INDEPENDENT_AMBULATORY_CARE_PROVIDER_SITE_OTHER): Payer: Medicare Other

## 2016-08-25 ENCOUNTER — Encounter: Payer: Self-pay | Admitting: Orthopedic Surgery

## 2016-08-25 VITALS — BP 148/88 | HR 84 | Wt 165.0 lb

## 2016-08-25 DIAGNOSIS — M75102 Unspecified rotator cuff tear or rupture of left shoulder, not specified as traumatic: Secondary | ICD-10-CM | POA: Diagnosis not present

## 2016-08-25 DIAGNOSIS — M25512 Pain in left shoulder: Secondary | ICD-10-CM | POA: Diagnosis not present

## 2016-08-25 NOTE — Patient Instructions (Signed)

## 2016-08-25 NOTE — Progress Notes (Signed)
Patient ID: [name], female   DOB: 06/04/1936, 81 y.o.   MRN: [id number]  Pain left shoulder 6 months  HPI [name] [name] is a 81 y.o. female.  Presents back for evaluation of left shoulder pain she's had for the last 6 months she received a cortisone injection took Tylenol continues to have pain over the left upper shoulder painful range of motion night pain weakness with some radiation into the upper left forearm which is described as a dull aching pain  Review of Systems Review of Systems She denies numbness or tingling in the left upper extremity  No shortness of breath or chest pain  Past Medical History:  Diagnosis Date  . Anemia    history  . Anxiety   . Atrial fibrillation   . Chronic back pain   . COPD (chronic obstructive pulmonary disease) ([place])   . Coronary atherosclerosis of native coronary artery    DES x 2 to RCA 10/10  . 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 Essential hypertension, benign   . GERD (gastroesophageal reflux disease)   . H/O hiatal hernia   . Headache(784.0)   . HOH (hard of hearing)   . Hyperlipidemia   . Neuromuscular disorder ([place])    Tremors  . Pericardial effusion    Hemorrhagic   . Presence of permanent cardiac pacemaker   . Resting tremor   . Tachycardia-bradycardia syndrome ([place])    s/p Medtronic Adapta L dual chamber device  5/10    Past Surgical History:  Procedure Laterality Date  . APPENDECTOMY    . CARDIAC CATHETERIZATION     stents x2.  Marland Kitchen CATARACT EXTRACTION    . CHOLECYSTECTOMY    . COLONOSCOPY  2011  . COLONOSCOPY N/A 10/19/2014   Procedure: COLONOSCOPY;  Surgeon: Rogene Houston, MD;  Location: [facility];  Service: Endoscopy;  Laterality: N/A;  1030  . ESOPHAGOGASTRODUODENOSCOPY N/A 10/26/2014   Procedure: ESOPHAGOGASTRODUODENOSCOPY (EGD);  Surgeon: Rogene Houston, MD;  Location: [facility];  Service: Endoscopy;  Laterality: N/A;  230 - Dr. has lunch and learn  .  Esophagogastroduodenoscopy with esophageal dilation  2004, 2006, 2007  . GIVENS CAPSULE STUDY N/A 10/31/2014   Procedure: GIVENS CAPSULE STUDY;  Surgeon: Rogene Houston, MD;  Location: [facility];  Service: Endoscopy;  Laterality: N/A;  730 -- pacemaker--needs monitoring--outpatient bed  . INSERT / REPLACE / REMOVE PACEMAKER    . LEFT HEART CATHETERIZATION WITH CORONARY ANGIOGRAM N/A 11/17/2011   Procedure: LEFT HEART CATHETERIZATION WITH CORONARY ANGIOGRAM;  Surgeon: Sherren Mocha, MD;  Location: [facility] CATH LAB;  Service: Cardiovascular;  Laterality: N/A;  . LEFT HEART CATHETERIZATION WITH CORONARY ANGIOGRAM N/A 09/26/2014   Procedure: LEFT HEART CATHETERIZATION WITH CORONARY ANGIOGRAM;  Surgeon: Leonie Man, MD;  Location: [facility] CATH LAB;  Service: Cardiovascular;  Laterality: N/A;  . Right rotator cuff repair    . SHOULDER ACROMIOPLASTY Right 05/30/2015   Procedure: RIGHT SHOULDER ACROMIOPLASTY;  Surgeon: Carole Civil, MD;  Location: [facility] ORS;  Service: Orthopedics;  Laterality: Right;  . SHOULDER OPEN ROTATOR CUFF REPAIR Right 05/30/2015   Procedure: OPEN ROTATOR CUFF REPAIR RIGHT SHOULDER;  Surgeon: Carole Civil, MD;  Location: [facility] ORS;  Service: Orthopedics;  Laterality: Right;  . Subxiphoid pericardial window  11/10  . VAGINAL HYSTERECTOMY      Social History Social History  Substance Use Topics  . Smoking status: Former Smoker    Packs/day: 2.00  Years: 10.00    Types: Cigarettes    Quit date: 05/23/1989  . Smokeless tobacco: Never Used  . Alcohol use No    Allergies  Allergen Reactions  . Amitriptyline Hcl Other (See Comments)    Caused "jaws to twist and lock"  . Sulfonamide Derivatives Other (See Comments)    UNKNOWN REACTION    Current Meds  Medication Sig  . ALPRAZolam (XANAX) 1 MG tablet Take 0.5-1 mg by mouth at bedtime.   Marland Kitchen amiodarone (PACERONE) 100 MG tablet Take 100 mg by mouth daily.  Marland Kitchen amLODipine (NORVASC) 5 MG tablet Take 5 mg by mouth at  bedtime.  Marland Kitchen aspirin 81 MG tablet Take 81 mg by mouth daily.  . clopidogrel (PLAVIX) 75 MG tablet Take 75 mg by mouth.   . Cyanocobalamin (B-12 PO) Take 1 tablet by mouth daily.   Marland Kitchen esomeprazole (NEXIUM) 40 MG capsule Take 40 mg by mouth as needed (acid reflux).  . furosemide (LASIX) 40 MG tablet Take 20-40 mg by mouth daily.   . metoprolol succinate (TOPROL XL) 25 MG 24 hr tablet Take 2 tablets (50 mg total) by mouth 2 (two) times daily.  . nitroGLYCERIN (NITROSTAT) 0.4 MG SL tablet Place 0.4 mg under the tongue every 5 (five) minutes as needed for chest pain.   Marland Kitchen ondansetron (ZOFRAN) 4 MG tablet Take 1 tablet (4 mg total) by mouth 2 (two) times daily as needed for nausea or vomiting.  . Pediatric Multivitamins-Iron (FLINTSTONES PLUS IRON) chewable tablet Chew 1 tablet by mouth daily.  . potassium chloride SA (K-DUR,KLOR-CON) 20 MEQ tablet Take 20-40 mEq by mouth daily.   . primidone (MYSOLINE) 50 MG tablet Take 50 mg by mouth 2 (two) times daily as needed (for tremors).   . promethazine (PHENERGAN) 12.5 MG tablet Take 1 tablet (12.5 mg total) by mouth every 6 (six) hours as needed for nausea or vomiting.  . thiamine (VITAMIN B-1) 100 MG tablet Take 100 mg by mouth daily.  . valsartan (DIOVAN) 160 MG tablet Take 1 tablet (160 mg total) by mouth daily.  . vitamin E 400 UNIT capsule Take 400 Units by mouth daily.      Physical Exam Physical Exam BP (!) 148/88   Pulse 84   Wt 165 lb (74.8 kg)   BMI 26.23 kg/m   Gen. appearance. The patient is well-developed and well-nourished, grooming and hygiene are normal. There are no gross congenital abnormalities  The patient is alert and oriented to person place and time  Mood and affect are normal  Ambulation Normal  Examination reveals the following: On inspection we find tenderness in the peri-acromial region of the left shoulder with active flexion of only 90 and abduction 80 and external rotation 40  Stability tests are normal she  had weakness grade 4 the rotator cuff supraspinatus normal and internal and external rotators. The skin was without rash or ulceration or erythema skin sensation was intact on the left and right hand pulses were good in the left and right arm she had mild tenderness in the cervical spine.   Right shoulder range of motion was near normal status post rotator cuff repair Data Reviewed Bone changes in the greater tuberosity consistent with rotator cuff disease  Assessment    Probable left rotator cuff tear    Plan    Inject left shoulder  MRI cannot be done arthrogram will be done to assess rotator cuff tear  Procedure note the subacromial injection shoulder left   Verbal  consent was obtained to inject the  Left   Shoulder  Timeout was completed to confirm the injection site is a subacromial space of the  left  shoulder  Medication used Depo-Medrol 40 mg and lidocaine 1% 3 cc  Anesthesia was provided by ethyl chloride  The injection was performed in the left  posterior subacromial space. After pinning the skin with alcohol and anesthetized the skin with ethyl chloride the subacromial space was injected using a 20-gauge needle. There were no complications  Sterile dressing was applied.               Arther Abbott 08/25/2016, 2:18 PM

## 2016-08-28 ENCOUNTER — Encounter: Payer: Self-pay | Admitting: *Deleted

## 2016-08-28 IMAGING — CR DG CHEST 1V PORT
1 series · 1 of 1 positions shown · non-contrast
Comparison: 12/04/2011

CLINICAL DATA: Central chest pain for 2 hr.

EXAM:
PORTABLE CHEST - 1 VIEW

[portable]
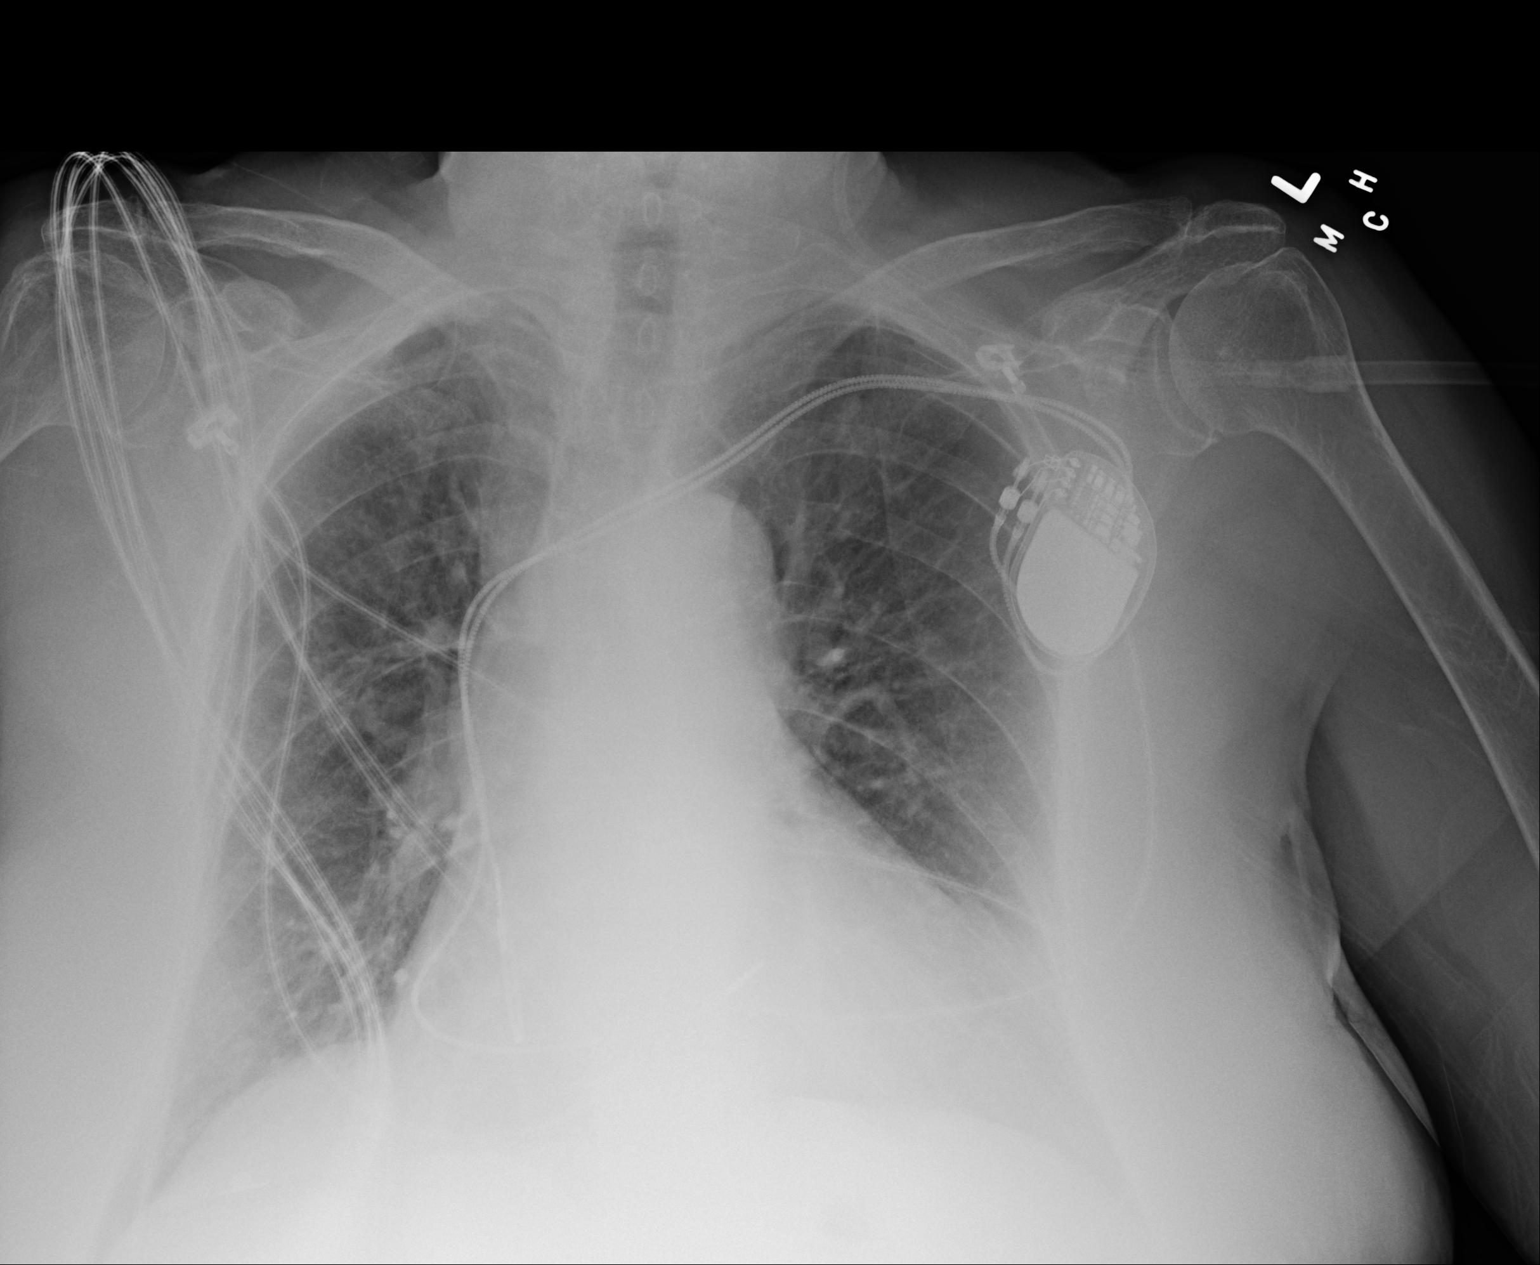

[1 of 1 positions shown; findings below may reference images not displayed]

FINDINGS: The heart is mildly enlarged but stable. Stable pacer wires. There
is tortuosity, ectasia and calcification of the thoracic aorta. The
lungs are grossly clear. Low lung volumes with mild vascular
crowding and streaky atelectasis. No pleural effusion. The bony
thorax is intact.
IMPRESSION: Stable cardiac enlargement.

Low lung volumes with vascular crowding and atelectasis.

## 2016-08-28 NOTE — Progress Notes (Signed)
PATIENT IS AWARE OF DG ARTHROGRAM SCHEDULED FOR FRI FEB 2 @ 9:45AM. IS AWARE TO STOP TAKING PLAVIX 5 DAYS PRIOR.

## 2016-08-30 IMAGING — NM NM MYOCAR MULTI W/SPECT W/WALL MOTION & EF
2 series · 12 of 12 positions shown · non-contrast
Comparison: none

CLINICAL DATA: 78-year-old woman with CAD status post DES x 2 to
the RCA in 2070, atrial fibrillation with tachycardia-bradycardia
syndrome status post pacemaker placement, hyperlipidemia, COPD,
anxiety, and hypertension. She presents with chest discomfort and
has ruled out for myocardial infarction. This study is requested to
evaluate for the presence and extent of ischemia.

EXAM:
MYOCARDIAL IMAGING WITH SPECT (REST AND PHARMACOLOGIC-STRESS)
GATED LEFT VENTRICULAR WALL MOTION STUDY
LEFT VENTRICULAR EJECTION FRACTION
TECHNIQUE: Standard myocardial SPECT imaging was performed after resting
intravenous injection of 10 mCi Yc-VVm sestamibi. Subsequently,
intravenous infusion of Lexiscan was performed under the supervision
of the Cardiology staff. At peak effect of the drug, 30 mCi Yc-VVm
sestamibi was injected intravenously and standard myocardial SPECT
imaging was performed. Quantitative gated imaging was also performed
to evaluate left ventricular wall motion, and estimate left
ventricular ejection fraction.

[Series 1: rest · 8.28mm/px · 6 of 64 frames shown]
[frame 6/64]
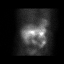
[frame 16/64]
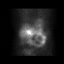
[frame 27/64]
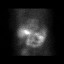
[frame 38/64]
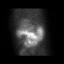
[frame 48/64]
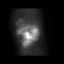
[frame 59/64]
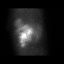

[Series 2: stress gated · 8.28mm/px · 6 of 64 frames shown]
[frame 6/64]
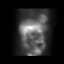
[frame 16/64]
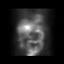
[frame 27/64]
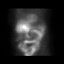
[frame 38/64]
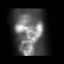
[frame 48/64]
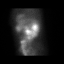
[frame 59/64]
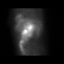

[12 of 12 positions shown; findings below may reference images not displayed]

FINDINGS: Baseline ECG shows atrial paced rhythm at 60 beats per min. Lexiscan
bolus was given in standard fashion. Heart rate increased from 60
beats per min up to 75 beats per min, and blood pressure increased
from 127/76 up to 131/80. Patient tolerated infusion well. There
were no diagnostic ST segment abnormalities noted. Rare PVC was
seen.

Analysis of the overall perfusion data finds adequate myocardial
radiotracer uptake.

Perfusion: There is a small, mild intensity, apical to basal
inferolateral defect that exhibits partial reversibility. This is
most consistent with scar and mild peri-infarct ischemia.

Wall Motion: Normal left ventricular wall motion. No left
ventricular dilation.

Left Ventricular Ejection Fraction: 56 %

End diastolic volume 55 ml

End systolic volume 24 ml
IMPRESSION: 1. Small region of scar with mild peri-infarct ischemia in the
inferolateral wall..

2. Normal left ventricular wall motion.

3. Left ventricular ejection fraction 56%

4. Low-risk stress test findings*.

*8358 Appropriate Use Criteria for Coronary Revascularization
Focused Update: J Am Coll Cardiol. 8358;59(9):857-881.
[URL]

## 2016-09-05 ENCOUNTER — Ambulatory Visit (HOSPITAL_COMMUNITY)
Admission: RE | Admit: 2016-09-05 | Discharge: 2016-09-05 | Disposition: A | Discharge status: Home / Self Care | Payer: Medicare Other | Source: Ambulatory Visit | Attending: Orthopedic Surgery | Admitting: Orthopedic Surgery

## 2016-09-05 ENCOUNTER — Encounter (HOSPITAL_COMMUNITY): Payer: Self-pay

## 2016-09-05 DIAGNOSIS — M75102 Unspecified rotator cuff tear or rupture of left shoulder, not specified as traumatic: Secondary | ICD-10-CM | POA: Insufficient documentation

## 2016-09-05 DIAGNOSIS — M75122 Complete rotator cuff tear or rupture of left shoulder, not specified as traumatic: Secondary | ICD-10-CM | POA: Diagnosis not present

## 2016-09-05 MED ORDER — LIDOCAINE HCL (PF) 1 % IJ SOLN
INTRAMUSCULAR | Status: AC
  Administered 2016-09-05: 3 mL
  Filled 2016-09-05: qty 5

## 2016-09-05 MED ORDER — POVIDONE-IODINE 10 % EX SOLN
CUTANEOUS | Status: AC
  Administered 2016-09-05: 10:00:00
  Filled 2016-09-05: qty 15

## 2016-09-05 MED ORDER — IOPAMIDOL (ISOVUE-300) INJECTION 61%
INTRAVENOUS | Status: AC
  Administered 2016-09-05: 11 mL
  Filled 2016-09-05: qty 30

## 2016-09-09 DIAGNOSIS — I509 Heart failure, unspecified: Secondary | ICD-10-CM | POA: Diagnosis not present

## 2016-09-15 ENCOUNTER — Emergency Department (HOSPITAL_COMMUNITY): Payer: Medicare Other

## 2016-09-15 ENCOUNTER — Observation Stay (HOSPITAL_COMMUNITY)
Admission: EM | Admit: 2016-09-15 | Discharge: 2016-09-16 | Disposition: A | Discharge status: Home / Self Care | Payer: Medicare Other | Attending: Internal Medicine | Admitting: Internal Medicine

## 2016-09-15 ENCOUNTER — Encounter (HOSPITAL_COMMUNITY): Payer: Self-pay | Admitting: *Deleted

## 2016-09-15 DIAGNOSIS — J989 Respiratory disorder, unspecified: Secondary | ICD-10-CM | POA: Diagnosis not present

## 2016-09-15 DIAGNOSIS — Z7982 Long term (current) use of aspirin: Secondary | ICD-10-CM | POA: Diagnosis not present

## 2016-09-15 DIAGNOSIS — I480 Paroxysmal atrial fibrillation: Secondary | ICD-10-CM | POA: Diagnosis not present

## 2016-09-15 DIAGNOSIS — G20 Parkinson's disease: Secondary | ICD-10-CM | POA: Insufficient documentation

## 2016-09-15 DIAGNOSIS — R259 Unspecified abnormal involuntary movements: Secondary | ICD-10-CM | POA: Diagnosis present

## 2016-09-15 DIAGNOSIS — R0602 Shortness of breath: Secondary | ICD-10-CM | POA: Diagnosis not present

## 2016-09-15 DIAGNOSIS — I2510 Atherosclerotic heart disease of native coronary artery without angina pectoris: Secondary | ICD-10-CM | POA: Diagnosis not present

## 2016-09-15 DIAGNOSIS — R0789 Other chest pain: Secondary | ICD-10-CM | POA: Diagnosis not present

## 2016-09-15 DIAGNOSIS — J449 Chronic obstructive pulmonary disease, unspecified: Secondary | ICD-10-CM | POA: Diagnosis not present

## 2016-09-15 DIAGNOSIS — R079 Chest pain, unspecified: Secondary | ICD-10-CM | POA: Diagnosis present

## 2016-09-15 DIAGNOSIS — Z79899 Other long term (current) drug therapy: Secondary | ICD-10-CM | POA: Diagnosis not present

## 2016-09-15 DIAGNOSIS — R05 Cough: Secondary | ICD-10-CM | POA: Diagnosis not present

## 2016-09-15 DIAGNOSIS — I10 Essential (primary) hypertension: Secondary | ICD-10-CM | POA: Diagnosis present

## 2016-09-15 DIAGNOSIS — I4891 Unspecified atrial fibrillation: Secondary | ICD-10-CM | POA: Diagnosis present

## 2016-09-15 DIAGNOSIS — I4819 Other persistent atrial fibrillation: Secondary | ICD-10-CM | POA: Diagnosis present

## 2016-09-15 DIAGNOSIS — Z87891 Personal history of nicotine dependence: Secondary | ICD-10-CM | POA: Diagnosis not present

## 2016-09-15 LAB — BASIC METABOLIC PANEL
Anion gap: 10 (ref 5–15)
BUN: 11 mg/dL (ref 6–20)
CALCIUM: 9.3 mg/dL (ref 8.9–10.3)
CHLORIDE: 101 mmol/L (ref 101–111)
CO2: 27 mmol/L (ref 22–32)
CREATININE: 0.68 mg/dL (ref 0.44–1.00)
Glucose, Bld: 105 mg/dL — ABNORMAL HIGH (ref 65–99)
Potassium: 3.9 mmol/L (ref 3.5–5.1)
SODIUM: 138 mmol/L (ref 135–145)

## 2016-09-15 LAB — CBC
HCT: 41.3 % (ref 36.0–46.0)
Hemoglobin: 13.4 g/dL (ref 12.0–15.0)
MCH: 30.2 pg (ref 26.0–34.0)
MCHC: 32.4 g/dL (ref 30.0–36.0)
MCV: 93.2 fL (ref 78.0–100.0)
PLATELETS: 193 10*3/uL (ref 150–400)
RBC: 4.43 MIL/uL (ref 3.87–5.11)
RDW: 14 % (ref 11.5–15.5)
WBC: 7.1 10*3/uL (ref 4.0–10.5)

## 2016-09-15 LAB — TROPONIN I

## 2016-09-15 MED ORDER — PRIMIDONE 50 MG PO TABS
50.0000 mg | ORAL_TABLET | Freq: Every day | ORAL | Status: DC
  Administered 2016-09-15: 50 mg via ORAL
  Filled 2016-09-15 (×2): qty 1

## 2016-09-15 MED ORDER — AMLODIPINE BESYLATE 5 MG PO TABS
5.0000 mg | ORAL_TABLET | Freq: Every day | ORAL | Status: DC
  Administered 2016-09-15: 5 mg via ORAL
  Filled 2016-09-15: qty 1

## 2016-09-15 MED ORDER — IRBESARTAN 150 MG PO TABS
150.0000 mg | ORAL_TABLET | Freq: Every day | ORAL | Status: DC
  Administered 2016-09-16: 150 mg via ORAL
  Filled 2016-09-15: qty 1

## 2016-09-15 MED ORDER — MORPHINE SULFATE (PF) 2 MG/ML IV SOLN
2.0000 mg | INTRAVENOUS | Status: DC | PRN
  Administered 2016-09-15 – 2016-09-16 (×3): 2 mg via INTRAVENOUS
  Filled 2016-09-15 (×3): qty 1

## 2016-09-15 MED ORDER — ACETAMINOPHEN 325 MG PO TABS
650.0000 mg | ORAL_TABLET | Freq: Once | ORAL | Status: AC
  Administered 2016-09-15: 650 mg via ORAL
  Filled 2016-09-15: qty 2

## 2016-09-15 MED ORDER — ACETAMINOPHEN 325 MG PO TABS: 650.0000 mg | ORAL_TABLET | ORAL | Status: DC | PRN

## 2016-09-15 MED ORDER — ASPIRIN 81 MG PO CHEW
81.0000 mg | CHEWABLE_TABLET | Freq: Every day | ORAL | Status: DC
  Administered 2016-09-16: 81 mg via ORAL
  Filled 2016-09-15: qty 1

## 2016-09-15 MED ORDER — NITROGLYCERIN 0.4 MG SL SUBL
0.4000 mg | SUBLINGUAL_TABLET | Freq: Once | SUBLINGUAL | Status: AC
  Administered 2016-09-15: 0.4 mg via SUBLINGUAL
  Filled 2016-09-15: qty 1

## 2016-09-15 MED ORDER — PANTOPRAZOLE SODIUM 40 MG PO TBEC: 80.0000 mg | DELAYED_RELEASE_TABLET | Freq: Every day | ORAL | Status: DC

## 2016-09-15 MED ORDER — PRIMIDONE 50 MG PO TABS
ORAL_TABLET | ORAL | Status: AC
  Filled 2016-09-15: qty 1

## 2016-09-15 MED ORDER — ALPRAZOLAM 0.5 MG PO TABS
0.5000 mg | ORAL_TABLET | Freq: Every day | ORAL | Status: DC
  Administered 2016-09-15: 1 mg via ORAL
  Filled 2016-09-15: qty 2

## 2016-09-15 MED ORDER — AMIODARONE HCL 200 MG PO TABS
100.0000 mg | ORAL_TABLET | Freq: Every day | ORAL | Status: DC
Start: 2016-09-16 — End: 2016-09-16
  Administered 2016-09-16: 100 mg via ORAL
  Filled 2016-09-15: qty 1

## 2016-09-15 MED ORDER — CLOPIDOGREL BISULFATE 75 MG PO TABS
75.0000 mg | ORAL_TABLET | Freq: Every day | ORAL | Status: DC
  Administered 2016-09-16: 75 mg via ORAL
  Filled 2016-09-15: qty 1

## 2016-09-15 MED ORDER — GI COCKTAIL ~~LOC~~
30.0000 mL | Freq: Four times a day (QID) | ORAL | Status: DC | PRN
  Administered 2016-09-15 – 2016-09-16 (×2): 30 mL via ORAL
  Filled 2016-09-15 (×2): qty 30

## 2016-09-15 MED ORDER — NITROGLYCERIN 2 % TD OINT
0.5000 [in_us] | TOPICAL_OINTMENT | Freq: Once | TRANSDERMAL | Status: AC
  Administered 2016-09-15: 0.5 [in_us] via TOPICAL
  Filled 2016-09-15: qty 1

## 2016-09-15 MED ORDER — METOPROLOL SUCCINATE ER 50 MG PO TB24
50.0000 mg | ORAL_TABLET | Freq: Two times a day (BID) | ORAL | Status: DC
  Administered 2016-09-15 – 2016-09-16 (×2): 50 mg via ORAL
  Filled 2016-09-15 (×2): qty 1

## 2016-09-15 MED ORDER — ENOXAPARIN SODIUM 40 MG/0.4ML ~~LOC~~ SOLN
40.0000 mg | SUBCUTANEOUS | Status: DC
  Administered 2016-09-15: 40 mg via SUBCUTANEOUS
  Filled 2016-09-15: qty 0.4

## 2016-09-15 MED ORDER — ONDANSETRON HCL 4 MG/2ML IJ SOLN
4.0000 mg | Freq: Four times a day (QID) | INTRAMUSCULAR | Status: DC | PRN
  Administered 2016-09-15: 4 mg via INTRAVENOUS
  Filled 2016-09-15: qty 2

## 2016-09-15 NOTE — ED Notes (Signed)
Hospitalist at bedside 

## 2016-09-15 NOTE — H&P (Signed)
History and Physical    [name] [id number] DOB: 01-11-1936 DOA: 09/15/2016  PCP: Wende Neighbors, MD Consultants:  Domenic Polite, cardiology; Rehman - GI; Aline Brochure - orthopedics; Lovena Le - EP Patient coming from: home - lives with adopted 79yo daughter; NOK: Daughter [name], [phone]  Chief Complaint: chest pain  HPI: [name] is a 81 y.o. female with medical history significant of CAD with stents x 2 in 2010; pacemaker; remote hemorrhagic pericardial effusion requiring pericardial window; neuromuscular disorder with tremors (?Parkinson's); HTN; and afib presenting with diffuse chest pain, numbness and pain down left arm.  Arm symptoms present one time the other week but it went away.  Sometimes wakes up at night with SOB, wet clammy sweat.  This AM, woke up with SOB and arm symptoms.  Chest pain on and off for a while.  Has swallowing problems, hard to tell if related.  PCP and cardiologist both suggested she come to the ER.  She is a difficult historian.  Echo 12/10/15 with preserved EF but grade 2 diastolic dysfunction.  Low risk stress test in 1/16.  H/o PAF but not on anticoagulation due to h/o hemorrhagic pericardial effusion.  Given ASA by EMS.  Some improvement in symptoms with NTG in ER.   Review of Systems: As per HPI; otherwise 10 point review of systems reviewed and negative.   Ambulatory Status:  ambulates without assistance  Past Medical History:  Diagnosis Date  . Anemia    history  . Anxiety   . Atrial fibrillation   . Chronic back pain   . COPD (chronic obstructive pulmonary disease) ([place])   . Coronary atherosclerosis of native coronary artery    DES x 2 to RCA 10/10  . 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 Essential hypertension, benign   . GERD (gastroesophageal reflux disease)   . H/O hiatal hernia   . Headache(784.0)   . HOH (hard of hearing)   . Hyperlipidemia   . Neuromuscular disorder (HCC)    Tremors, ?Parkinson's disease  .  Pericardial effusion    Hemorrhagic   . Presence of permanent cardiac pacemaker   . Tachycardia-bradycardia syndrome ([place])    s/p Medtronic Adapta L dual chamber device  5/10    Past Surgical History:  Procedure Laterality Date  . APPENDECTOMY    . CARDIAC CATHETERIZATION     stents x2.  Marland Kitchen CATARACT EXTRACTION    . CHOLECYSTECTOMY    . COLONOSCOPY  2011  . COLONOSCOPY N/A 10/19/2014   Procedure: COLONOSCOPY;  Surgeon: Rogene Houston, MD;  Location: [facility];  Service: Endoscopy;  Laterality: N/A;  1030  . ESOPHAGOGASTRODUODENOSCOPY N/A 10/26/2014   Procedure: ESOPHAGOGASTRODUODENOSCOPY (EGD);  Surgeon: Rogene Houston, MD;  Location: [facility];  Service: Endoscopy;  Laterality: N/A;  230 - Dr. has lunch and learn  . Esophagogastroduodenoscopy with esophageal dilation  2004, 2006, 2007  . GIVENS CAPSULE STUDY N/A 10/31/2014   Procedure: GIVENS CAPSULE STUDY;  Surgeon: Rogene Houston, MD;  Location: [facility];  Service: Endoscopy;  Laterality: N/A;  730 -- pacemaker--needs monitoring--outpatient bed  . INSERT / REPLACE / REMOVE PACEMAKER    . LEFT HEART CATHETERIZATION WITH CORONARY ANGIOGRAM N/A 11/17/2011   Procedure: LEFT HEART CATHETERIZATION WITH CORONARY ANGIOGRAM;  Surgeon: Sherren Mocha, MD;  Location: [facility] CATH LAB;  Service: Cardiovascular;  Laterality: N/A;  . LEFT HEART CATHETERIZATION WITH CORONARY ANGIOGRAM N/A 09/26/2014   Procedure: LEFT HEART CATHETERIZATION WITH CORONARY ANGIOGRAM;  Surgeon: Leonie Man, MD;  Location: [facility] CATH LAB;  Service: Cardiovascular;  Laterality: N/A;  . Right rotator cuff repair    . SHOULDER ACROMIOPLASTY Right 05/30/2015   Procedure: RIGHT SHOULDER ACROMIOPLASTY;  Surgeon: Carole Civil, MD;  Location: [facility] ORS;  Service: Orthopedics;  Laterality: Right;  . SHOULDER OPEN ROTATOR CUFF REPAIR Right 05/30/2015   Procedure: OPEN ROTATOR CUFF REPAIR RIGHT SHOULDER;  Surgeon: Carole Civil, MD;  Location: [facility] ORS;  Service:  Orthopedics;  Laterality: Right;  . Subxiphoid pericardial window  11/10  . VAGINAL HYSTERECTOMY      Social History   Social History  . Marital status: Widowed    Spouse name: N/A  . Number of children: N/A  . Years of education: N/A   Occupational History  . Retired Retired   Social History Main Topics  . Smoking status: Former Smoker    Packs/day: 2.00    Years: 10.00    Types: Cigarettes    Quit date: 05/23/1989  . Smokeless tobacco: Never Used  . Alcohol use No  . Drug use: No  . Sexual activity: No   Other Topics Concern  . Not on file   Social History Narrative   She lives alone, has adopted daughter.    Allergies  Allergen Reactions  . Amitriptyline Hcl Other (See Comments)    Caused "jaws to twist and lock"  . Sulfonamide Derivatives Other (See Comments)    UNKNOWN REACTION    Family History  Problem Relation Age of Onset  . Cancer Mother     Colon   . Coronary artery disease Sister   . Coronary artery disease Brother   . Arthritis    . Lung disease    . Asthma      Prior to Admission medications   Medication Sig Start Date End Date Taking? Authorizing Provider  ALPRAZolam Duanne Moron) 1 MG tablet Take 0.5-1 mg by mouth at bedtime.    Yes Historical Provider, MD  aspirin 81 MG tablet Take 81 mg by mouth daily.   Yes Historical Provider, MD  nitroGLYCERIN (NITROSTAT) 0.4 MG SL tablet Place 0.4 mg under the tongue every 5 (five) minutes as needed for chest pain.    Yes Historical Provider, MD  Pediatric Multivitamins-Iron (FLINTSTONES PLUS IRON) chewable tablet Chew 1 tablet by mouth daily. 07/22/16  Yes Rogene Houston, MD  vitamin E 400 UNIT capsule Take 400 Units by mouth daily.   Yes Historical Provider, MD  amiodarone (PACERONE) 100 MG tablet Take 100 mg by mouth daily.    Historical Provider, MD  amLODipine (NORVASC) 5 MG tablet Take 5 mg by mouth at bedtime.    Historical Provider, MD  clopidogrel (PLAVIX) 75 MG tablet Take 75 mg by mouth.      Historical Provider, MD  Cyanocobalamin (B-12 PO) Take 1 tablet by mouth daily.     Historical Provider, MD  esomeprazole (NEXIUM) 40 MG capsule Take 40 mg by mouth as needed (acid reflux).    Historical Provider, MD  furosemide (LASIX) 40 MG tablet Take 20-40 mg by mouth daily.  12/04/11   Satira Sark, MD  metoprolol succinate (TOPROL XL) 25 MG 24 hr tablet Take 2 tablets (50 mg total) by mouth 2 (two) times daily. 07/07/16   Satira Sark, MD  ondansetron (ZOFRAN) 4 MG tablet Take 1 tablet (4 mg total) by mouth 2 (two) times daily as needed for nausea or vomiting. 07/22/16   Rogene Houston, MD  potassium chloride SA (K-DUR,KLOR-CON) 20 MEQ tablet Take 20-40 mEq by mouth daily.     Rexene Alberts, MD  primidone (MYSOLINE) 50 MG tablet Take 50 mg by mouth 2 (two) times daily as needed (for tremors).  10/31/10   Historical Provider, MD  promethazine (PHENERGAN) 12.5 MG tablet Take 1 tablet (12.5 mg total) by mouth every 6 (six) hours as needed for nausea or vomiting. 06/04/15   Carole Civil, MD  thiamine (VITAMIN B-1) 100 MG tablet Take 100 mg by mouth daily.    Historical Provider, MD  valsartan (DIOVAN) 160 MG tablet Take 1 tablet (160 mg total) by mouth daily. 03/24/16   Satira Sark, MD    Physical Exam: Vitals:   09/15/16 1730 09/15/16 1800 09/15/16 1849 09/15/16 2047  BP: 179/78 163/96 139/81 (!) 154/71  Pulse:   84 80  Resp: (!) 29 17 16 16   Temp:   97.8 F (36.6 C) 98.2 F (36.8 C)  TempSrc:   Oral Oral  SpO2:   96% 97%  Weight:   75 kg (165 lb 4.8 oz)   Height:   5\' 6"  (1.676 m)      General:  Appears calm and comfortable and is NAD; she has a marked resting tremor and is very conversational Eyes:  PERRL, EOMI, normal lids, iris ENT: very hard of hearing,normal  lips & tongue, mmm Neck:  no LAD, masses or thyromegaly Cardiovascular:  RRR, no m/r/g. No LE edema.  Respiratory:  CTA bilaterally, no w/r/r. Normal respiratory effort. Abdomen:  soft, ntnd,  NABS Skin:  no rash or induration seen on limited exam Musculoskeletal:  grossly normal tone BUE/BLE, good ROM, no bony abnormality Psychiatric:  grossly normal mood and affect, speech fluent and appropriate, AOx3 Neurologic:  CN 2-12 grossly intact, moves all extremities in coordinated fashion, sensation intact; diffuse tremor  Labs on Admission: I have personally reviewed following labs and imaging studies  CBC:  Recent Labs Lab 09/15/16 1506  WBC 7.1  HGB 13.4  HCT 41.3  MCV 93.2  PLT 0000000   Basic Metabolic Panel:  Recent Labs Lab 09/15/16 1506  NA 138  K 3.9  CL 101  CO2 27  GLUCOSE 105*  BUN 11  CREATININE 0.68  CALCIUM 9.3   GFR: Estimated Creatinine Clearance: 60 mL/min (by C-G formula based on SCr of 0.68 mg/dL). Liver Function Tests: No results for input(s): AST, ALT, ALKPHOS, BILITOT, PROT, ALBUMIN in the last 168 hours. No results for input(s): LIPASE, AMYLASE in the last 168 hours. No results for input(s): AMMONIA in the last 168 hours. Coagulation Profile: No results for input(s): INR, PROTIME in the last 168 hours. Cardiac Enzymes:  Recent Labs Lab 09/15/16 1506 09/15/16 1946  TROPONINI <0.03 <0.03   BNP (last 3 results) No results for input(s): PROBNP in the last 8760 hours. HbA1C: No results for input(s): HGBA1C in the last 72 hours. CBG: No results for input(s): GLUCAP in the last 168 hours. Lipid Profile: No results for input(s): CHOL, HDL, LDLCALC, TRIG, CHOLHDL, LDLDIRECT in the last 72 hours. Thyroid Function Tests: No results for input(s): TSH, T4TOTAL, FREET4, T3FREE, THYROIDAB in the last 72 hours. Anemia Panel: No results for input(s): VITAMINB12, FOLATE, FERRITIN, TIBC, IRON, RETICCTPCT in the last 72 hours. Urine analysis:    Component Value Date/Time   COLORURINE YELLOW 12/07/2009 [place] 12/07/2009 1532   LABSPEC 1.010 12/07/2009 1532   PHURINE 6.5 12/07/2009 1532   GLUCOSEU NEGATIVE 12/07/2009 1532  HGBUR NEGATIVE 12/07/2009 [place] 12/07/2009 1532   KETONESUR NEGATIVE 12/07/2009 1532   PROTEINUR NEGATIVE 12/07/2009 1532   UROBILINOGEN 0.2 12/07/2009 1532   NITRITE NEGATIVE 12/07/2009 1532   LEUKOCYTESUR  12/07/2009 1532    NEGATIVE MICROSCOPIC NOT DONE ON URINES WITH NEGATIVE PROTEIN, BLOOD, LEUKOCYTES, NITRITE, OR GLUCOSE <1000 mg/dL.    Creatinine Clearance: Estimated Creatinine Clearance: 60 mL/min (by C-G formula based on SCr of 0.68 mg/dL).  Sepsis Labs: @LABRCNTIP (procalcitonin:4,lacticidven:4) )No results found for this or any previous visit (from the past 240 hour(s)).   Radiological Exams on Admission: Dg Chest 2 View  Result Date: 09/15/2016 CLINICAL DATA:  Chest pain. EXAM: CHEST  2 VIEW COMPARISON:  Radiographs of June 17, 2016. FINDINGS: The heart size and mediastinal contours are within normal limits. Both lungs are clear. No pneumothorax or pleural effusion is noted. Left-sided pacemaker is unchanged in position. Atherosclerosis of thoracic aorta is noted. The visualized skeletal structures are unremarkable. IMPRESSION: No active cardiopulmonary disease. Electronically Signed   By: Marijo Conception, M.D.   On: 09/15/2016 15:04    EKG: Independently reviewed.  Atrial paced rhythm with rate 72; significant artifarct with no evidence of acute ischemia  Assessment/Plan Principal Problem:   Chest pain Active Problems:   Atrial fibrillation (HCC)   Abnormal involuntary movement   Chest pain -Patient with substernal chest pain that came on at rest, has been intermittently present for "some time", and improved with NTG -2-3/3 typical symptoms suggestive of atypical vs. typical chest pain.  -CXR unremarkable.   -Initial cardiac troponin negative; if the chest pain has been ongoing for several days then this is quite reassuring, but the patient is not a good historian and this is not clear.  -EKG not indicative of acute ischemia.   -TIMI risk  score is 4; which predicts a 14 days risk of death, recurrent MI, or urgent revascularization of 19.9%.  -Will plan to place in observation status on telemetry to rule out ACS by overnight observation.  -cycle troponin q6h x 3 and repeat EKG in AM -Continue ASA 81 mg and Plavix daily -morphine given -She does not appear to be taking a statin, and this would likely be beneficial -Risk factor stratification with FLP; will also check TSH -Cardiology consultation by Dr. Domenic Polite - NPO for possible stress test -Will plan to start Heparin drip if her enzymes are positive and/or chest pain recurs  HTN -Takes Norvasc, Toprol XL, and Diovan at home  Afib -h/o afib, does not appear to currently be present but she has marked tremor so this is difficult to assess -Has pacemaker -Rate controlled -Not on AC due to h/o hemorrhagic pericardial effusion  Movement d/o -Chronic -Parkinson's vs. Other -May benefit from outpatient neurology consult, but does not appear to be an acute issue (other than possibly contributing to her left arm symptoms)   DVT prophylaxis: Lovenox  Code Status: Full - confirmed with patient Family Communication: None present Disposition Plan:  Home once clinically improved Consults called: Cardiology Admission status: It is my clinical opinion that referral for OBSERVATION is reasonable and necessary in this patient based on the above information provided. The aforementioned taken together are felt to place the patient at high risk for further clinical deterioration. However it is anticipated that the patient may be medically stable for discharge from the hospital within 24 to 48 hours.    Karmen Bongo MD [facility]  If 7PM-7AM, please contact night-coverage [email or web address] Password [email or web address]  09/15/2016, 9:41 PM

## 2016-09-15 NOTE — ED Triage Notes (Signed)
Pt comes in by EMS from home for chest pain. She has had a dry cough and congestion for the last several days. Pt has increased pain upon palpation. Pt alert and oriented upon arrival.

## 2016-09-15 NOTE — ED Provider Notes (Signed)
Emergency Department Provider Note   I have reviewed the triage vital signs and the nursing notes.   HISTORY  Chief Complaint Chest Pain   HPI [name] is a 81 y.o. female with PMH of parkinson's disease, COPD, a-fib, GERD, HLD, and CAD s/p DES placement 2010 presents to the emergency department for evaluation of chest pressure. She's had intermittent symptoms for the last week they've been constant today. She has some associated difficulty breathing and numbness in her left arm. She denies any exacerbating or alleviating factors. She was given an aspirin after family called 54. She states she has nitroglycerin at home but did not use it today. No associated fevers, chills, vomiting, diarrhea. She describes the pain as mild to moderate and not significantly worsening or improving at this time.   Past Medical History:  Diagnosis Date  . Anemia    history  . Anxiety   . Atrial fibrillation   . Chronic back pain   . COPD (chronic obstructive pulmonary disease) ([place])   . Coronary atherosclerosis of native coronary artery    DES x 2 to RCA 10/10  . 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 Essential hypertension, benign   . GERD (gastroesophageal reflux disease)   . H/O hiatal hernia   . Headache(784.0)   . HOH (hard of hearing)   . Hyperlipidemia   . Neuromuscular disorder (HCC)    Tremors, ?Parkinson's disease  . Pericardial effusion    Hemorrhagic   . Presence of permanent cardiac pacemaker   . Tachycardia-bradycardia syndrome [facility])    s/p Medtronic Adapta L dual chamber device  5/10    Patient Active Problem List   Diagnosis Date Noted  . Nonspecific chest pain 09/15/2016  . Rotator cuff tear   . Anemia 10/31/2014  . Chest pain with moderate risk for cardiac etiology 08/23/2014  . Diastolic dysfunction with chronic heart failure ([place]) 08/23/2014  . PERICARDIAL EFFUSION 07/10/2009  . Essential hypertension, benign 04/18/2009  .  Atherosclerosis of native coronary artery with angina pectoris ([place]) 04/18/2009  . PPM-Medtronic 04/04/2009  . BRADYCARDIA-TACHYCARDIA SYNDROME 01/26/2009  . HLD (hyperlipidemia) 12/19/2008  . Anxiety state 12/19/2008  . Atrial fibrillation ([place]) 12/19/2008  . GERD 12/19/2008  . Abnormal involuntary movement 12/19/2008    Past Surgical History:  Procedure Laterality Date  . APPENDECTOMY    . CARDIAC CATHETERIZATION     stents x2.  Marland Kitchen CATARACT EXTRACTION    . CHOLECYSTECTOMY    . COLONOSCOPY  2011  . COLONOSCOPY N/A 10/19/2014   Procedure: COLONOSCOPY;  Surgeon: Rogene Houston, MD;  Location[facility] ENDO SU[facility];  Service: Endoscopy;  Laterality: N/A;  1030  . ESOPHAGOGASTRODUODENOSCOPY N/A 10/26/2014   Procedure: ESOPHAGOGASTRODUODENOSCOPY (EGD);  Surgeon: Rogene Houston, MD;  Location: [facility] ENDO SUITE;  Service: Endoscopy;  Laterality: N/A;  230 - Dr. has lunch and learn  . Esophagogastroduodenoscopy with esophageal dilation  2004, 2006, 2007  . GIVENS CAPSULE STUDY N/A 10/31/2014   Procedure: GIVENS CAPSULE STUDY;  Surgeon: Rogene Houston, MD;  Location: [facility] ENDO SUITE;  Service: Endoscopy;  Laterality: N/A;  730 -- pacemaker--needs monitoring--outpatient bed  . INSERT / REPLACE / REMOVE PACEMAKER    . LEFT HEART CATHETERIZATION WITH CORONARY ANGIOGRAM N/A 11/17/2011   Procedure: LEFT HEART CATHETERIZATION WITH CORONARY ANGIOGRAM;  Surgeon: Sherren Mocha, MD;  Location: [facility] CATH LAB;  Service: Cardiovascular;  Laterality: N/A;  . LEFT HEART CATHETERIZATION WITH CORONARY ANGIOGRAM N/A 09/26/2014  Procedure: LEFT HEART CATHETERIZATION WITH CORONARY ANGIOGRAM;  Surgeon: Leonie Man, MD;  Location: [facility] CATH LAB;  Service: Cardiovascular;  Laterality: N/A;  . Right rotator cuff repair    . SHOULDER ACROMIOPLASTY Right 05/30/2015   Procedure: RIGHT SHOULDER ACROMIOPLASTY;  Surgeon: Carole Civil, MD;  Location: [facility] ORS;  Service: Orthopedics;  Laterality: Right;  . SHOULDER OPEN ROTATOR CUFF  REPAIR Right 05/30/2015   Procedure: OPEN ROTATOR CUFF REPAIR RIGHT SHOULDER;  Surgeon: Carole Civil, MD;  Location: [facility] ORS;  Service: Orthopedics;  Laterality: Right;  . Subxiphoid pericardial window  11/10  . VAGINAL HYSTERECTOMY        Allergies Amitriptyline hcl and Sulfonamide derivatives  Family History  Problem Relation Age of Onset  . Cancer Mother     Colon   . Coronary artery disease Sister   . Coronary artery disease Brother   . Arthritis    . Lung disease    . Asthma      Social History Social History  Substance Use Topics  . Smoking status: Former Smoker    Packs/day: 2.00    Years: 10.00    Types: Cigarettes    Quit date: 05/23/1989  . Smokeless tobacco: Never Used  . Alcohol use No    Review of Systems  Constitutional: No fever/chills Eyes: No visual changes. ENT: No sore throat. Cardiovascular: Positive chest pain. Respiratory: Positive shortness of breath. Gastrointestinal: No abdominal pain.  No nausea, no vomiting.  No diarrhea.  No constipation. Genitourinary: Negative for dysuria. Musculoskeletal: Negative for back pain. Skin: Negative for rash. Neurological: Negative for headaches, focal weakness or numbness.  10-point ROS otherwise negative.  ____________________________________________   PHYSICAL EXAM:  VITAL SIGNS: ED Triage Vitals  Enc Vitals Group     BP 09/15/16 1413 160/84     Pulse Rate 09/15/16 1413 72     Resp 09/15/16 1413 18     Temp 09/15/16 1413 98.3 F (36.8 C)     Temp Source 09/15/16 1413 Oral     SpO2 09/15/16 1413 100 %     Weight 09/15/16 1408 165 lb (74.8 kg)     Height 09/15/16 1408 5\' 6"  (1.676 m)   Constitutional: Alert and oriented. Well appearing and in no acute distress. Eyes: Conjunctivae are normal.  Head: Atraumatic. Nose: No congestion/rhinnorhea. Mouth/Throat: Mucous membranes are moist.  Neck: No stridor.  Cardiovascular: Normal rate, regular rhythm. Good peripheral circulation.  Grossly normal heart sounds.   Respiratory: Normal respiratory effort.  No retractions. Lungs CTAB. Gastrointestinal: Soft and nontender. No distention.  Musculoskeletal: No lower extremity tenderness nor edema. No gross deformities of extremities. Neurologic:  Normal speech and language. No gross focal neurologic deficits are appreciated. Intention tremor noted.  Skin:  Skin is warm, dry and intact. No rash noted. Psychiatric: Mood and affect are normal. Speech and behavior are normal.  ____________________________________________   LABS (all labs ordered are listed, but only abnormal results are displayed)  Labs Reviewed  BASIC METABOLIC PANEL - Abnormal; Notable for the following:       Result Value   Glucose, Bld 105 (*)    All other components within normal limits  LIPID PANEL - Abnormal; Notable for the following:    Cholesterol 239 (*)    Triglycerides 189 (*)    LDL Cholesterol 160 (*)    All other components within normal limits  CBC  TROPONIN I  TROPONIN I  TROPONIN I  TROPONIN I  TSH   ____________________________________________  EKG   EKG Interpretation  Date/Time:  Monday September 15 2016 14:13:52 EST Ventricular Rate:  72 PR Interval:    QRS Duration: 92 QT Interval:  408 QTC Calculation: 447 R Axis:   50 Text Interpretation:  Atrial-paced rhythm Similar to prior. No STEMI.  Confirmed by Maniyah Moller MD, Kimi Bordeau [phone]) on 09/15/2016 2:16:42 PM       ____________________________________________  RADIOLOGY  Dg Chest 2 View  Result Date: 09/15/2016 CLINICAL DATA:  Chest pain. EXAM: CHEST  2 VIEW COMPARISON:  Radiographs of June 17, 2016. FINDINGS: The heart size and mediastinal contours are within normal limits. Both lungs are clear. No pneumothorax or pleural effusion is noted. Left-sided pacemaker is unchanged in position. Atherosclerosis of thoracic aorta is noted. The visualized skeletal structures are unremarkable. IMPRESSION: No active  cardiopulmonary disease. Electronically Signed   By: Marijo Conception, M.D.   On: 09/15/2016 15:04    ____________________________________________   PROCEDURES  Procedure(s) performed:   Procedures  None ____________________________________________   INITIAL IMPRESSION / ASSESSMENT AND PLAN / ED COURSE  Pertinent labs & imaging results that were available during my care of the patient were reviewed by me and considered in my medical decision making (see chart for details).  Patient with multiple medical comorbidities presents to the emergency department for evaluation of chest discomfort. EKG shows an atrial paced rhythm with some artifact secondary to the patient's Parkinson's disease. No obvious ischemia. Patient continues to have chest pain on arrival to the emergency department. She was given aspirin in route. Plan to give nitroglycerin and send labs including biomarkers and obtain chest x-ray.   Discussed patient's case with Hospitalist, Dr. Lorin Mercy. Patient and family (if present) updated with plan. Care transferred to Hospitalist service.  I reviewed all nursing notes, vitals, pertinent old records, EKGs, labs, imaging (as available).  ____________________________________________  FINAL CLINICAL IMPRESSION(S) / ED DIAGNOSES  Final diagnoses:  Nonspecific chest pain     MEDICATIONS GIVEN DURING THIS VISIT:  Medications  amLODipine (NORVASC) tablet 5 mg (5 mg Oral Given 09/15/16 2023)  pantoprazole (PROTONIX) EC tablet 80 mg (not administered)  metoprolol succinate (TOPROL-XL) 24 hr tablet 50 mg (50 mg Oral Given 09/15/16 2023)  irbesartan (AVAPRO) tablet 150 mg (not administered)  aspirin chewable tablet 81 mg (not administered)  amiodarone (PACERONE) tablet 100 mg (not administered)  clopidogrel (PLAVIX) tablet 75 mg (not administered)  ALPRAZolam (XANAX) tablet 0.5-1 mg (1 mg Oral Given 09/15/16 2022)  primidone (MYSOLINE) tablet 50 mg (50 mg Oral Given 09/15/16 2024)    enoxaparin (LOVENOX) injection 40 mg (40 mg Subcutaneous Given 09/15/16 2023)  morphine 2 MG/ML injection 2 mg (2 mg Intravenous Given 09/15/16 2304)  gi cocktail (Maalox,Lidocaine,Donnatal) (30 mLs Oral Given 09/16/16 0455)  acetaminophen (TYLENOL) tablet 650 mg (not administered)  ondansetron (ZOFRAN) injection 4 mg (4 mg Intravenous Given 09/15/16 2304)  zolpidem (AMBIEN) tablet 5 mg (not administered)  nitroGLYCERIN (NITROSTAT) SL tablet 0.4 mg (0.4 mg Sublingual Given 09/15/16 1427)  acetaminophen (TYLENOL) tablet 650 mg (650 mg Oral Given 09/15/16 1709)  nitroGLYCERIN (NITROGLYN) 2 % ointment 0.5 inch (0.5 inches Topical Given 09/15/16 1709)  morphine 4 MG/ML injection 4 mg (4 mg Intravenous Given 09/16/16 0104)     NEW OUTPATIENT MEDICATIONS STARTED DURING THIS VISIT:  None   Note:  This document was prepared using Dragon voice recognition software and may include unintentional dictation errors.  Nanda Quinton, MD Emergency Medicine   Margette Fast, MD 09/16/16 0900

## 2016-09-16 DIAGNOSIS — J449 Chronic obstructive pulmonary disease, unspecified: Secondary | ICD-10-CM | POA: Diagnosis not present

## 2016-09-16 DIAGNOSIS — I2510 Atherosclerotic heart disease of native coronary artery without angina pectoris: Secondary | ICD-10-CM | POA: Diagnosis not present

## 2016-09-16 DIAGNOSIS — R079 Chest pain, unspecified: Secondary | ICD-10-CM

## 2016-09-16 DIAGNOSIS — R0602 Shortness of breath: Secondary | ICD-10-CM | POA: Diagnosis not present

## 2016-09-16 DIAGNOSIS — I10 Essential (primary) hypertension: Secondary | ICD-10-CM | POA: Diagnosis not present

## 2016-09-16 DIAGNOSIS — R0789 Other chest pain: Secondary | ICD-10-CM | POA: Diagnosis not present

## 2016-09-16 LAB — LIPID PANEL
CHOL/HDL RATIO: 5.8 ratio
Cholesterol: 239 mg/dL — ABNORMAL HIGH (ref 0–200)
HDL: 41 mg/dL (ref >40)
LDL Cholesterol: 160 mg/dL — ABNORMAL HIGH (ref 0–99)
TRIGLYCERIDES: 189 mg/dL — AB (ref <150)
VLDL: 38 mg/dL (ref 0–40)

## 2016-09-16 LAB — TROPONIN I: Troponin I: <0.03 ng/mL

## 2016-09-16 LAB — TSH: TSH: 2.4 u[IU]/mL (ref 0.350–4.500)

## 2016-09-16 MED ORDER — ZOLPIDEM TARTRATE 5 MG PO TABS: 5.0000 mg | ORAL_TABLET | Freq: Every evening | ORAL | Status: DC | PRN

## 2016-09-16 MED ORDER — MORPHINE SULFATE (PF) 4 MG/ML IV SOLN
4.0000 mg | Freq: Once | INTRAVENOUS | Status: AC
  Administered 2016-09-16: 4 mg via INTRAVENOUS
  Filled 2016-09-16: qty 1

## 2016-09-16 NOTE — Progress Notes (Signed)
[facility] discharged Home per MD order.  Discharge instructions reviewed and discussed with the patient, all questions and concerns answered. Copy of instructions given to patient. Allergies as of 09/16/2016      Reactions   Amitriptyline Hcl Other (See Comments)   Caused "jaws to twist and lock"   Sulfonamide Derivatives Other (See Comments)   UNKNOWN REACTION      Medication List    TAKE these medications   ALPRAZolam 1 MG tablet Commonly known as:  XANAX Take 0.5-1 mg by mouth at bedtime.   amiodarone 100 MG tablet Commonly known as:  PACERONE Take 100 mg by mouth daily.   amLODipine 5 MG tablet Commonly known as:  NORVASC Take 5 mg by mouth at bedtime.   aspirin 81 MG tablet Take 81 mg by mouth daily.   B-12 PO Take 1 tablet by mouth daily.   clopidogrel 75 MG tablet Commonly known as:  PLAVIX Take 75 mg by mouth daily.   esomeprazole 40 MG capsule Commonly known as:  NEXIUM Take 40 mg by mouth 2 (two) times daily before a meal.   FLINTSTONES PLUS IRON chewable tablet Chew 1 tablet by mouth daily.   furosemide 40 MG tablet Commonly known as:  LASIX Take 20-40 mg by mouth daily.   metoprolol succinate 25 MG 24 hr tablet Commonly known as:  TOPROL XL Take 2 tablets (50 mg total) by mouth 2 (two) times daily.   NITROSTAT 0.4 MG SL tablet Generic drug:  nitroGLYCERIN Place 0.4 mg under the tongue every 5 (five) minutes as needed for chest pain.   potassium chloride SA 20 MEQ tablet Commonly known as:  K-DUR,KLOR-CON Take 20-40 mEq by mouth daily.   primidone 50 MG tablet Commonly known as:  MYSOLINE Take 50 mg by mouth at bedtime.   thiamine 100 MG tablet Commonly known as:  VITAMIN B-1 Take 100 mg by mouth daily.   valsartan 160 MG tablet Commonly known as:  DIOVAN Take 1 tablet (160 mg total) by mouth daily.   vitamin E 400 UNIT capsule Take 400 Units by mouth daily.       Patients skin is clean, dry and intact, no evidence of skin break  down. IV site discontinued and catheter remains intact. Site without signs and symptoms of complications. Dressing and pressure applied.   Patient escorted to car by NT in a wheelchair,  no distress noted upon discharge.  Ralene Muskrat Graci Hulce 09/16/2016 2:22 PM

## 2016-09-16 NOTE — Progress Notes (Signed)
Placed on 2L 02 per [facility] d/t complaints of Shortness of breath. New order for morphine 4mg  x1 and Ambien. Will continue to monitor pt

## 2016-09-16 NOTE — Care Management Obs Status (Signed)
[place] NOTIFICATION   Patient Details  Name: [name] MRN: [id number] Date of Birth: May 15, 1936   Medicare Observation Status Notification Given:  Yes    Maron Stanzione, Chauncey Reading, RN 09/16/2016, 11:35 AM

## 2016-09-16 NOTE — Discharge Summary (Signed)
Physician Discharge Summary  [name] [id number] DOB: 1936-07-13 DOA: 09/15/2016  PCP: Wende Neighbors, MD  Admit date: 09/15/2016 Discharge date: 09/16/2016  Time spent: 45 minutes  Recommendations for Outpatient Follow-up:  -Will be discharged home today. -Advised to follow up with cardiologist in 1-2 weeks.   Discharge Diagnoses:  Principal Problem:   Nonspecific chest pain Active Problems:   Essential hypertension, benign   Atrial fibrillation (HCC)   Abnormal involuntary movement   Discharge Condition: Stable and improved  Filed Weights   09/15/16 1408 09/15/16 1849  Weight: 74.8 kg (165 lb) 75 kg (165 lb 4.8 oz)    History of present illness:  As per Dr. Lorin Mercy on 2/12: [name] is a 81 y.o. female with medical history significant of CAD with stents x 2 in 2010; pacemaker; remote hemorrhagic pericardial effusion requiring pericardial window; neuromuscular disorder with tremors (?Parkinson's); HTN; and afib presenting with diffuse chest pain, numbness and pain down left arm.  Arm symptoms present one time the other week but it went away.  Sometimes wakes up at night with SOB, wet clammy sweat.  This AM, woke up with SOB and arm symptoms.  Chest pain on and off for a while.  Has swallowing problems, hard to tell if related.  PCP and cardiologist both suggested she come to the ER.  She is a difficult historian.  Echo 12/10/15 with preserved EF but grade 2 diastolic dysfunction.  Low risk stress test in 1/16.  H/o PAF but not on anticoagulation due to h/o hemorrhagic pericardial effusion.  Given ASA by EMS.  Some improvement in symptoms with NTG in ER.  Hospital Course:   Chest Pain/Left Shoulder Pain -Has ruled out for ACS by negative troponins and EKGs without acute ischemic changes. -Has had no further CP since admission. -She has been having left shoulder pain for months and has been evaluated by Dr. Aline Brochure and told she has a rotator cuff tear. Advised to  follow up with ortho for further management. Do not believe shoulder pain is associated to CP.  Rest of chronic medical issues have been stable.  Procedures:  None   Consultations:  None  Discharge Instructions  Discharge Instructions    Diet - low sodium heart healthy    Complete by:  As directed    Increase activity slowly    Complete by:  As directed      Allergies as of 09/16/2016      Reactions   Amitriptyline Hcl Other (See Comments)   Caused "jaws to twist and lock"   Sulfonamide Derivatives Other (See Comments)   UNKNOWN REACTION      Medication List    TAKE these medications   ALPRAZolam 1 MG tablet Commonly known as:  XANAX Take 0.5-1 mg by mouth at bedtime.   amiodarone 100 MG tablet Commonly known as:  PACERONE Take 100 mg by mouth daily.   amLODipine 5 MG tablet Commonly known as:  NORVASC Take 5 mg by mouth at bedtime.   aspirin 81 MG tablet Take 81 mg by mouth daily.   B-12 PO Take 1 tablet by mouth daily.   clopidogrel 75 MG tablet Commonly known as:  PLAVIX Take 75 mg by mouth daily.   esomeprazole 40 MG capsule Commonly known as:  NEXIUM Take 40 mg by mouth 2 (two) times daily before a meal.   FLINTSTONES PLUS IRON chewable tablet Chew 1 tablet by mouth daily.   furosemide 40 MG tablet Commonly  known as:  LASIX Take 20-40 mg by mouth daily.   metoprolol succinate 25 MG 24 hr tablet Commonly known as:  TOPROL XL Take 2 tablets (50 mg total) by mouth 2 (two) times daily.   NITROSTAT 0.4 MG SL tablet Generic drug:  nitroGLYCERIN Place 0.4 mg under the tongue every 5 (five) minutes as needed for chest pain.   potassium chloride SA 20 MEQ tablet Commonly known as:  K-DUR,KLOR-CON Take 20-40 mEq by mouth daily.   primidone 50 MG tablet Commonly known as:  MYSOLINE Take 50 mg by mouth at bedtime.   thiamine 100 MG tablet Commonly known as:  VITAMIN B-1 Take 100 mg by mouth daily.   valsartan 160 MG tablet Commonly known  as:  DIOVAN Take 1 tablet (160 mg total) by mouth daily.   vitamin E 400 UNIT capsule Take 400 Units by mouth daily.      Allergies  Allergen Reactions  . Amitriptyline Hcl Other (See Comments)    Caused "jaws to twist and lock"  . Sulfonamide Derivatives Other (See Comments)    UNKNOWN REACTION   Follow-up Information    Wende Neighbors, MD. Schedule an appointment as soon as possible for a visit in 2 week(s).   Specialty:  Internal Medicine Contact information: [phone] [phone]            The results of significant diagnostics from this hospitalization (including imaging, microbiology, ancillary and laboratory) are listed below for reference.    Significant Diagnostic Studies: Dg Chest 2 View  Result Date: 09/15/2016 CLINICAL DATA:  Chest pain. EXAM: CHEST  2 VIEW COMPARISON:  Radiographs of June 17, 2016. FINDINGS: The heart size and mediastinal contours are within normal limits. Both lungs are clear. No pneumothorax or pleural effusion is noted. Left-sided pacemaker is unchanged in position. Atherosclerosis of thoracic aorta is noted. The visualized skeletal structures are unremarkable. IMPRESSION: No active cardiopulmonary disease. Electronically Signed   By: Marijo Conception, M.D.   On: 09/15/2016 15:04   Dg Arthro Shoulder Left  Result Date: 09/05/2016 INDICATION: LEFT shoulder pain, rotator cuff tear EXAM: LEFT SHOULDER ARTHROGRAPHY UNDER FLUOROSCOPIC GUIDANCE COMPARISON:  08/25/2016 FLUOROSCOPY TIME:  Fluoroscopy Time:  1 minutes 12 seconds Radiation Exposure Index (if provided by the fluoroscopic device): 5.1 mGy Number of Acquired Spot Images: Multiple screen captures during fluoroscopy COMPLICATIONS: None PROCEDURE: Procedure, risks, benefits and alternatives explained to the patient. Patient's questions answered. Written informed consent obtained. Timeout protocol followed. LEFT shoulder joint localized by fluoroscopy. Skin prepped  and draped in usual sterile fashion. Skin and soft tissues anesthetized with 3 mL of 1% lidocaine. Under fluoroscopic guidance, twenty-two gauge spinal needle was advanced into LEFT shoulder joint. 11 mL of Omnipaque 300 was injected into LEFT shoulder joint without difficulty. Procedure tolerated well by patient without immediate complication. FINDINGS: Diffuse osseous demineralization. Generator for LEFT subclavian sequential pacemaker projects over LEFT scapula/axilla medial to the shoulder joint. Normal distention of joint capsule. Initially, at rest, no contrast extravasation from the LEFT glenohumeral joint was identified. Following movement of the LEFT arm/shoulder, contrast is seen extravasated into the LEFT subacromial subdeltoid bursa through a full- thickness tear of the rotator cuff. IMPRESSION: Full-thickness rotator cuff tear LEFT shoulder. Electronically Signed   By: Lavonia Dana M.D.   On: 09/05/2016 11:30   Dg Shoulder Left  Result Date: 08/25/2016 Left shoulder 2 views AP and lateral Left shoulder pain Pacemaker is noted in the chest wall acromion is  a type II greater tuberosity sclerosis is noted Glenohumeral joint looks normal Impression chronic bone changes in the greater tuberosity consistent with rotator cuff disease   Microbiology: No results found for this or any previous visit (from the past 240 hour(s)).   Labs: Basic Metabolic Panel:  Recent Labs Lab 09/15/16 1506  NA 138  K 3.9  CL 101  CO2 27  GLUCOSE 105*  BUN 11  CREATININE 0.68  CALCIUM 9.3   Liver Function Tests: No results for input(s): AST, ALT, ALKPHOS, BILITOT, PROT, ALBUMIN in the last 168 hours. No results for input(s): LIPASE, AMYLASE in the last 168 hours. No results for input(s): AMMONIA in the last 168 hours. CBC:  Recent Labs Lab 09/15/16 1506  WBC 7.1  HGB 13.4  HCT 41.3  MCV 93.2  PLT 193   Cardiac Enzymes:  Recent Labs Lab 09/15/16 1506 09/15/16 1946 09/16/16 0100  09/16/16 0714  TROPONINI <0.03 <0.03 <0.03 <0.03   BNP: BNP (last 3 results) No results for input(s): BNP in the last 8760 hours.  ProBNP (last 3 results) No results for input(s): PROBNP in the last 8760 hours.  CBG: No results for input(s): GLUCAP in the last 168 hours.     SignedLelon Frohlich  [facility] Pager: [phone] 09/16/2016, 5:51 PM

## 2016-09-16 NOTE — Progress Notes (Signed)
Pt c/o continuous chest pain 4/10. Pt walking in hallway, and room. Given 2mg  Morphine x2, Zofran as well as GI cocktail so far this shift. MD on call paged.

## 2016-09-16 NOTE — Care Management Note (Signed)
Case Management Note  Patient Details  Name: [name] MRN: [id number] Date of Birth: 08-28-1935  Subjective/Objective:    Patient adm from home with CP. She is ind with ADL's, has an 79 y.o. Adopted daughter that lives with her. She has home oxygen if needed. She has PCP, reports she still drives to appointments, and reports no issues affording medications.                 Action/Plan: Anticipate DC home with self care.    Expected Discharge Date:         09/17/2016        Expected Discharge Plan:  Home/Self Care  In-House Referral:  NA  Discharge planning Services  CM Consult  Post Acute Care Choice:  NA Choice offered to:  NA  DME Arranged:    DME Agency:     HH Arranged:    HH Agency:     Status of Service:  Completed, signed off  If discussed at [employer] of Stay Meetings, dates discussed:    Additional Comments:  Rashidah [place], Chauncey Reading, RN 09/16/2016, 11:32 AM

## 2016-09-21 ENCOUNTER — Emergency Department (HOSPITAL_COMMUNITY)
Admission: EM | Admit: 2016-09-21 | Discharge: 2016-09-21 | Disposition: A | Discharge status: Home / Self Care | Payer: Medicare Other | Attending: Emergency Medicine | Admitting: Emergency Medicine

## 2016-09-21 ENCOUNTER — Encounter (HOSPITAL_COMMUNITY): Payer: Self-pay

## 2016-09-21 DIAGNOSIS — I2510 Atherosclerotic heart disease of native coronary artery without angina pectoris: Secondary | ICD-10-CM | POA: Insufficient documentation

## 2016-09-21 DIAGNOSIS — Z7982 Long term (current) use of aspirin: Secondary | ICD-10-CM | POA: Insufficient documentation

## 2016-09-21 DIAGNOSIS — I10 Essential (primary) hypertension: Secondary | ICD-10-CM | POA: Insufficient documentation

## 2016-09-21 DIAGNOSIS — J449 Chronic obstructive pulmonary disease, unspecified: Secondary | ICD-10-CM | POA: Insufficient documentation

## 2016-09-21 DIAGNOSIS — Z950 Presence of cardiac pacemaker: Secondary | ICD-10-CM | POA: Insufficient documentation

## 2016-09-21 DIAGNOSIS — R51 Headache: Secondary | ICD-10-CM | POA: Diagnosis present

## 2016-09-21 DIAGNOSIS — Z79899 Other long term (current) drug therapy: Secondary | ICD-10-CM | POA: Insufficient documentation

## 2016-09-21 DIAGNOSIS — B029 Zoster without complications: Secondary | ICD-10-CM | POA: Insufficient documentation

## 2016-09-21 DIAGNOSIS — Z87891 Personal history of nicotine dependence: Secondary | ICD-10-CM | POA: Diagnosis not present

## 2016-09-21 MED ORDER — VALACYCLOVIR HCL 1 G PO TABS: 1000.0000 mg | ORAL_TABLET | Freq: Three times a day (TID) | ORAL | 0 refills | Status: DC

## 2016-09-21 MED ORDER — HYDROCODONE-ACETAMINOPHEN 5-325 MG PO TABS: 1.0000 | ORAL_TABLET | Freq: Four times a day (QID) | ORAL | 0 refills | Status: DC | PRN

## 2016-09-21 MED ORDER — PREDNISONE 10 MG PO TABS: 20.0000 mg | ORAL_TABLET | Freq: Two times a day (BID) | ORAL | 0 refills | Status: DC

## 2016-09-21 NOTE — ED Triage Notes (Addendum)
Patient reports of right ear pani x1 week and now has swelling/redness noted to right side of face. States she was admitted for heart failure and still feels weak. Denies any other complaints.

## 2016-09-21 NOTE — Discharge Instructions (Signed)
Valtrex and prednisone as prescribed.  Hydrocodone as prescribed as needed for pain.  Follow up with your primary Dr. in the next 5 days, and return to the ER if your symptoms significantly worsen or change.

## 2016-09-21 NOTE — ED Provider Notes (Signed)
[facility] DEPT Provider Note   CSN: [id number] Arrival date & time: 09/21/16  1318     History   Chief Complaint Chief Complaint  Patient presents with  . Facial Swelling  . Weakness    HPI [name] is a 81 y.o. female.  Patient is a 81 year old female with history of congestive heart failure, COPD, hard of hearing. She presents for evaluation of right-sided facial pain and redness that started several days ago. She denies any fevers or chills. She does report she was recently hospitalized for a CHF exacerbation. She denies any chest pain or shortness of breath. But does complain of residual weakness from her prior admission.   The history is provided by the patient.    Past Medical History:  Diagnosis Date  . Anemia    history  . Anxiety   . Atrial fibrillation   . Chronic back pain   . COPD (chronic obstructive pulmonary disease) ([place])   . Coronary atherosclerosis of native coronary artery    DES x 2 to RCA 10/10  . 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 Essential hypertension, benign   . GERD (gastroesophageal reflux disease)   . H/O hiatal hernia   . Headache(784.0)   . HOH (hard of hearing)   . Hyperlipidemia   . Neuromuscular disorder (HCC)    Tremors, ?Parkinson's disease  . Pericardial effusion    Hemorrhagic   . Presence of permanent cardiac pacemaker   . Tachycardia-bradycardia syndrome [facility])    s/p Medtronic Adapta L dual chamber device  5/10    Patient Active Problem List   Diagnosis Date Noted  . Nonspecific chest pain 09/15/2016  . Rotator cuff tear   . Anemia 10/31/2014  . Chest pain with moderate risk for cardiac etiology 08/23/2014  . Diastolic dysfunction with chronic heart failure ([place]) 08/23/2014  . PERICARDIAL EFFUSION 07/10/2009  . Essential hypertension, benign 04/18/2009  . Atherosclerosis of native coronary artery with angina pectoris ([place]) 04/18/2009  . PPM-Medtronic 04/04/2009  .  BRADYCARDIA-TACHYCARDIA SYNDROME 01/26/2009  . HLD (hyperlipidemia) 12/19/2008  . Anxiety state 12/19/2008  . Atrial fibrillation ([place]) 12/19/2008  . GERD 12/19/2008  . Abnormal involuntary movement 12/19/2008    Past Surgical History:  Procedure Laterality Date  . APPENDECTOMY    . CARDIAC CATHETERIZATION     stents x2.  Marland Kitchen CATARACT EXTRACTION    . CHOLECYSTECTOMY    . COLONOSCOPY  2011  . COLONOSCOPY N/A 10/19/2014   Procedure: COLONOSCOPY;  Surgeon: Rogene Houston, MD;  Location[facility]O [facility];  Service: Endoscopy;  Laterality: N/A;  1030  . ESOPHAGOGASTRODUODENOSCOPY N/A 10/26/2014   Procedure: ESOPHAGOGASTRODUODENOSCOPY (EGD);  Surgeon: Rogene Houston, MD;  Location: [facility] ENDO SUITE;  Service: Endoscopy;  Laterality: N/A;  230 - Dr. has lunch and learn  . Esophagogastroduodenoscopy with esophageal dilation  2004, 2006, 2007  . GIVENS CAPSULE STUDY N/A 10/31/2014   Procedure: GIVENS CAPSULE STUDY;  Surgeon: Rogene Houston, MD;  Location: [facility] ENDO SUITE;  Service: Endoscopy;  Laterality: N/A;  730 -- pacemaker--needs monitoring--outpatient bed  . INSERT / REPLACE / REMOVE PACEMAKER    . LEFT HEART CATHETERIZATION WITH CORONARY ANGIOGRAM N/A 11/17/2011   Procedure: LEFT HEART CATHETERIZATION WITH CORONARY ANGIOGRAM;  Surgeon: Sherren Mocha, MD;  Location: [facility] CATH LAB;  Service: Cardiovascular;  Laterality: N/A;  . LEFT HEART CATHETERIZATION WITH CORONARY ANGIOGRAM N/A 09/26/2014   Procedure: LEFT HEART CATHETERIZATION WITH CORONARY ANGIOGRAM;  Surgeon: Leonie Man,  MD;  Location: [facility] CATH LAB;  Service: Cardiovascular;  Laterality: N/A;  . Right rotator cuff repair    . SHOULDER ACROMIOPLASTY Right 05/30/2015   Procedure: RIGHT SHOULDER ACROMIOPLASTY;  Surgeon: Carole Civil, MD;  Location: [facility] ORS;  Service: Orthopedics;  Laterality: Right;  . SHOULDER OPEN ROTATOR CUFF REPAIR Right 05/30/2015   Procedure: OPEN ROTATOR CUFF REPAIR RIGHT SHOULDER;  Surgeon: Carole Civil, MD;   Location: [facility] ORS;  Service: Orthopedics;  Laterality: Right;  . Subxiphoid pericardial window  11/10  . VAGINAL HYSTERECTOMY      OB History    Gravida Para Term Preterm AB Living   6 5 5   1 3    SAB TAB Ectopic Multiple Live Births   1               Home Medications    Prior to Admission medications   Medication Sig Start Date End Date Taking? Authorizing Provider  ALPRAZolam Duanne Moron) 1 MG tablet Take 0.5-1 mg by mouth at bedtime.     Historical Provider, MD  amiodarone (PACERONE) 100 MG tablet Take 100 mg by mouth daily.    Historical Provider, MD  amLODipine (NORVASC) 5 MG tablet Take 5 mg by mouth at bedtime.    Historical Provider, MD  aspirin 81 MG tablet Take 81 mg by mouth daily.    Historical Provider, MD  clopidogrel (PLAVIX) 75 MG tablet Take 75 mg by mouth daily.     Historical Provider, MD  Cyanocobalamin (B-12 PO) Take 1 tablet by mouth daily.     Historical Provider, MD  esomeprazole (NEXIUM) 40 MG capsule Take 40 mg by mouth 2 (two) times daily before a meal.     Historical Provider, MD  furosemide (LASIX) 40 MG tablet Take 20-40 mg by mouth daily.  12/04/11   Satira Sark, MD  HYDROcodone-acetaminophen (NORCO) 5-325 MG tablet Take 1-2 tablets by mouth every 6 (six) hours as needed. 09/21/16   Veryl Speak, MD  metoprolol succinate (TOPROL XL) 25 MG 24 hr tablet Take 2 tablets (50 mg total) by mouth 2 (two) times daily. 07/07/16   Satira Sark, MD  nitroGLYCERIN (NITROSTAT) 0.4 MG SL tablet Place 0.4 mg under the tongue every 5 (five) minutes as needed for chest pain.     Historical Provider, MD  Pediatric Multivitamins-Iron (FLINTSTONES PLUS IRON) chewable tablet Chew 1 tablet by mouth daily. 07/22/16   Rogene Houston, MD  potassium chloride SA (K-DUR,KLOR-CON) 20 MEQ tablet Take 20-40 mEq by mouth daily.     Rexene Alberts, MD  predniSONE (DELTASONE) 10 MG tablet Take 2 tablets (20 mg total) by mouth 2 (two) times daily. 09/21/16   Veryl Speak, MD  primidone  (MYSOLINE) 50 MG tablet Take 50 mg by mouth at bedtime.  10/31/10   Historical Provider, MD  thiamine (VITAMIN B-1) 100 MG tablet Take 100 mg by mouth daily.    Historical Provider, MD  valACYclovir (VALTREX) 1000 MG tablet Take 1 tablet (1,000 mg total) by mouth 3 (three) times daily. 09/21/16   Veryl Speak, MD  valsartan (DIOVAN) 160 MG tablet Take 1 tablet (160 mg total) by mouth daily. 03/24/16   Satira Sark, MD  vitamin E 400 UNIT capsule Take 400 Units by mouth daily.    Historical Provider, MD    Family History Family History  Problem Relation Age of Onset  . Cancer Mother     Colon   . Coronary artery disease Sister   .  Coronary artery disease Brother   . Arthritis    . Lung disease    . Asthma      Social History Social History  Substance Use Topics  . Smoking status: Former Smoker    Packs/day: 2.00    Years: 10.00    Types: Cigarettes    Quit date: 05/23/1989  . Smokeless tobacco: Never Used  . Alcohol use No     Allergies   Amitriptyline hcl and Sulfonamide derivatives   Review of Systems Review of Systems  All other systems reviewed and are negative.    Physical Exam Updated Vital Signs BP 147/89 (BP Location: Left Arm)   Pulse 73   Temp 97.7 F (36.5 C) (Oral)   Resp 18   Ht 5\' 6"  (1.676 m)   Wt 165 lb (74.8 kg)   SpO2 99%   BMI 26.63 kg/m   Physical Exam  Constitutional: She is oriented to person, place, and time. She appears well-developed and well-nourished. No distress.  HENT:  Head: Normocephalic and atraumatic.  TMs are clear. There is no involvement of the ear canal or the TM that I can see.  Eyes: EOM are normal. Pupils are equal, round, and reactive to light.  There is no redness or tidal injection. Corneas appear clear and she is complaining of no eye discomfort.  Neck: Normal range of motion. Neck supple.  Cardiovascular: Normal rate and regular rhythm.  Exam reveals no gallop and no friction rub.   No murmur  heard. Pulmonary/Chest: Effort normal and breath sounds normal. No respiratory distress. She has no wheezes.  Abdominal: Soft. Bowel sounds are normal. She exhibits no distension. There is no tenderness.  Musculoskeletal: Normal range of motion.  Neurological: She is alert and oriented to person, place, and time.  Skin: Skin is warm and dry. She is not diaphoretic.  There is an erythematous, vesicular rash noted to the right side of the face and temple and extending to the ear.  Nursing note and vitals reviewed.    ED Treatments / Results  Labs (all labs ordered are listed, but only abnormal results are displayed) Labs Reviewed - No data to display  EKG  EKG Interpretation None       Radiology No results found.  Procedures Procedures (including critical care time)  Medications Ordered in ED Medications - No data to display   Initial Impression / Assessment and Plan / ED Course  I have reviewed the triage vital signs and the nursing notes.  Pertinent labs & imaging results that were available during my care of the patient were reviewed by me and considered in my medical decision making (see chart for details).     This appears to be a shingles which will be treated with Valtrex, prednisone, and pain medication. She is to follow up with her primary Dr. this week for a recheck and return if her symptoms worsen.  Final Clinical Impressions(s) / ED Diagnoses   Final diagnoses:  Herpes zoster without complication    New Prescriptions New Prescriptions   HYDROCODONE-ACETAMINOPHEN (NORCO) 5-325 MG TABLET    Take 1-2 tablets by mouth every 6 (six) hours as needed.   PREDNISONE (DELTASONE) 10 MG TABLET    Take 2 tablets (20 mg total) by mouth 2 (two) times daily.   VALACYCLOVIR (VALTREX) 1000 MG TABLET    Take 1 tablet (1,000 mg total) by mouth 3 (three) times daily.     Veryl Speak, MD 09/21/16 [phone]

## 2016-09-22 ENCOUNTER — Ambulatory Visit (INDEPENDENT_AMBULATORY_CARE_PROVIDER_SITE_OTHER): Payer: Medicare Other | Admitting: Orthopedic Surgery

## 2016-09-22 DIAGNOSIS — M75122 Complete rotator cuff tear or rupture of left shoulder, not specified as traumatic: Secondary | ICD-10-CM | POA: Diagnosis not present

## 2016-09-22 DIAGNOSIS — B029 Zoster without complications: Secondary | ICD-10-CM | POA: Diagnosis not present

## 2016-09-22 DIAGNOSIS — M25512 Pain in left shoulder: Secondary | ICD-10-CM | POA: Diagnosis not present

## 2016-09-22 NOTE — Progress Notes (Signed)
FOLLOW UP VISIT   Patient ID: [name], female   DOB: [birth year], 81 y.o.   MRN: [id number]  Chief Complaint  Patient presents with  . Follow-up    Recheck left shoulder, review arthrogram of shoulder.    HPI [name] is a 81 y.o. female.   HPI  81 year old female with left shoulder pain status post arthrogram left shoulder still complaining of pain weakness and loss of motion  Review of Systems Review of Systems  Skin rash shingles right side of her face   Physical Exam  Constitutional: She is oriented to person, place, and time. She appears well-developed and well-nourished. No distress.  Cardiovascular: Normal rate and intact distal pulses.   Neurological: She is alert and oriented to person, place, and time. She has normal reflexes. She exhibits normal muscle tone. Coordination normal.  Skin: Skin is warm and dry. Rash noted. She is not diaphoretic. There is erythema. No pallor.  Psychiatric: She has a normal mood and affect. Her behavior is normal. Judgment and thought content normal.    Weakness rotator cuff decreased range of motion active left shoulder Shoulder still stable  MEDICAL DECISION MAKING  DATA   X-ray arthrogram shows torn rotator cuff complete tear  DIAGNOSIS  Torn rotator cuff complete  PLAN(RISK)    Stop Plavix 5 days before surgery  Patient agrees to have left rotator cuff repair open technique  This procedure has been fully reviewed with the patient and written informed consent has been obtained.

## 2016-09-22 NOTE — Patient Instructions (Signed)
You have decided to proceed with rotator cuff repair surgery. You have decided not to continue with nonoperative measures such as but not limited to oral medication,   activity modification, physical therapy, bracing, or injection.  We will perform a rotator cuff repair. Some of the risks associated with rotator cuff repair include but are not limited to Bleeding Infection Swelling Stiffness Blood clot Pain Re-tearing of the rotator cuff Failure of the rotator cuff to heal   If you're not comfortable with these risks and would like to continue with nonoperative treatment please let Dr. Aline Brochure know prior to your surgery.

## 2016-09-24 ENCOUNTER — Telehealth: Payer: Self-pay | Admitting: *Deleted

## 2016-09-24 ENCOUNTER — Encounter: Payer: Self-pay | Admitting: *Deleted

## 2016-09-24 ENCOUNTER — Other Ambulatory Visit: Payer: Self-pay | Admitting: *Deleted

## 2016-09-24 NOTE — Progress Notes (Signed)
Per patient plan surgical pre authorization not required for CPT 23412 Reference Katrina C. 09/24/16 5:03pm

## 2016-09-24 NOTE — Telephone Encounter (Signed)
PATIENT REQUESTING MEDICATION FOR PAIN, WAS TRYING TO DO WITHOUT BUT STATES SHE FEELS LIKE SHE IS NEEDING SOMETHING

## 2016-09-26 ENCOUNTER — Other Ambulatory Visit: Payer: Self-pay | Admitting: Orthopedic Surgery

## 2016-09-26 MED ORDER — ACETAMINOPHEN-CODEINE #3 300-30 MG PO TABS: 1.0000 | ORAL_TABLET | Freq: Four times a day (QID) | ORAL | 0 refills | Status: DC | PRN

## 2016-09-26 NOTE — Progress Notes (Signed)
[place] controlled substance reporting system reviewed  

## 2016-10-01 ENCOUNTER — Other Ambulatory Visit: Payer: Self-pay | Admitting: *Deleted

## 2016-10-01 MED ORDER — TRAMADOL HCL 50 MG PO TABS: 50.0000 mg | ORAL_TABLET | Freq: Four times a day (QID) | ORAL | 0 refills | Status: DC | PRN

## 2016-10-01 NOTE — Progress Notes (Signed)
TRAMA

## 2016-10-01 NOTE — Telephone Encounter (Signed)
TRAMADOL 50 MG Q6

## 2016-10-02 ENCOUNTER — Other Ambulatory Visit: Payer: Self-pay | Admitting: Critical Care Medicine

## 2016-10-07 DIAGNOSIS — I509 Heart failure, unspecified: Secondary | ICD-10-CM | POA: Diagnosis not present

## 2016-10-14 NOTE — Patient Instructions (Signed)
[name]  10/14/2016     @PREFPERIOPPHARMACY @   Your procedure is scheduled on  10/22/2016   Report to [facility] at  57  A.M.  Call this number if you have problems the morning of surgery:  [phone]   Remember:  Do not eat food or drink liquids after midnight.  Take these medicines the morning of surgery with A SIP OF WATER  Tylenol #3 or hydrocodone or ultram, pacerone, nexium, metoprolol.   Do not wear jewelry, make-up or nail polish.  Do not wear lotions, powders, or perfumes, or deoderant.  Do not shave 48 hours prior to surgery.  Men may shave face and neck.  Do not bring valuables to the hospital.  [facility] is not responsible for any belongings or valuables.  Contacts, dentures or bridgework may not be worn into surgery.  Leave your suitcase in the car.  After surgery it may be brought to your room.  For patients admitted to the hospital, discharge time will be determined by your treatment team.  Patients discharged the day of surgery will not be allowed to drive home.   Name and phone number of your driver:   family Special instructions:  none  Please read over the following fact sheets that you were given. Anesthesia Post-op Instructions and Care and Recovery After Surgery       Rotator Cuff Tear Rehab After Surgery Ask your health care provider which exercises are safe for you. Do exercises exactly as told by your health care provider and adjust them as directed. It is normal to feel mild stretching, pulling, tightness, or discomfort as you do these exercises, but you should stop right away if you feel sudden pain or your pain gets worse. Do not begin these exercises until told by your health care provider. Stretching and range of motion exercises These exercises warm up your muscles and joints and improve the movement and flexibility of your shoulder. These exercises also help to relieve pain, numbness, and tingling. Exercise A:  Pendulum   1. Stand near a wall or a surface that you can hold onto for balance. 2. Bend at the waist and let your left / right arm hang straight down. Use your other arm to keep your balance. 3. Relax your arm and shoulder muscles, and move your hips and your trunk so your left / right arm swings freely. Your arm should swing because of the motion of your body, not because you are using your arm or shoulder muscles. 4. Keep moving so your arm swings in the following directions, as told by your health care provider:  Side to side.  Forward and backward.  In clockwise and counterclockwise circles. Repeat __________ times, or for __________ seconds per direction. Complete this exercise __________ times a day. Exercise B: Flexion, seated   1. Sit in a stable chair so your left / right forearm can rest on a flat surface. Your elbow should rest at a height that keeps your upper arm next to your body. 2. Keeping your shoulder relaxed, lean forward at the waist and let your hand slide forward. Stop when you feel a stretch in your shoulder, or when you reach the angle that is recommended by your health care provider. 3. Hold for __________ seconds. 4. Slowly return to the starting position. Repeat __________ times. Complete this exercise __________ times a day. Exercise C: Flexion, standing   1. Stand and  hold a broomstick, a cane, or a similar object. Place your hands a little more than shoulder-width apart on the object. Your left / right hand should be palm-up, and your other hand should be palm-down. 2. Push the stick down with your healthy arm to raise your left / right arm in front of your body, and then over your head. Use your other hand to help move the stick. Stop when you feel a stretch in your shoulder, or when you reach the angle that is recommended by your health care provider.  Avoid shrugging your shoulder while you raise your arm. Keep your shoulder blade tucked down toward your  spine.  Keep your left / right shoulder muscles relaxed. 3. Hold for __________ seconds. 4. Slowly return to the starting position. Repeat __________ times. Complete this exercise __________ times a day. Exercise D: Abduction, supine   1. Lie on your back and hold a broomstick, a cane, or a similar object. Place your hands a little more than shoulder-width apart on the object. Your left / right hand should be palm-up, and your other hand should be palm-down. 2. Push the stick to raise your left / right arm out to your side and then over your head. Use your other hand to help move the stick. Stop when you feel a stretch in your shoulder, or when you reach the angle that is recommended by your health care provider.  Avoid shrugging your shoulder while you raise your arm. Keep your shoulder blade tucked down toward your spine. 3. Hold for __________ seconds. 4. Slowly return to the starting position. Repeat __________ times. Complete this exercise __________ times a day. Exercise E: Shoulder flexion, active-assisted   1. Lie on your back. You may bend your knees for comfort. 2. Hold a broomstick, a cane, or a similar object so your hands are about shoulder-width apart. Your palms should face toward your feet. 3. Raise your left / right arm over your head and behind your head, toward the floor. Use your other hand to help you do this. Stop when you feel a gentle stretch in your shoulder, or when you reach the angle that is recommended by your health care provider. 4. Hold for __________ seconds. 5. Use the broomstick and your other arm to help you return your left / right arm to the starting position. Repeat __________ times. Complete this exercise __________ times a day. Exercise F: External rotation   1. Sit in a stable chair without armrests, or stand. 2. Tuck a soft object, such as a folded towel or a small ball, under your left / right upper arm. 3. Hold a broomstick, a cane, or a similar  object so your palms face down, toward the floor. Bend your elbows to an "L" shape (90 degrees), and keep your hands about shoulder-width apart. 4. Straighten your healthy arm and push the broomstick across your body, toward your left / right side. Keep your left / right arm bent. This will rotate your left / right forearm away from your body. 5. Hold for __________ seconds. 6. Slowly return to the starting position. Repeat __________ times. Complete this exercise __________ times a day. Strengthening exercises These exercises build strength and endurance in your shoulder. Endurance is the ability to use your muscles for a long time, even after they get tired. Exercise G: Shoulder flexion, isometric   1. Stand or sit about 4-6 inches (10-15 cm) away from a wall with your left / right side  facing the wall. 2. Gently make a fist and place your left / right hand on the wall so the top of your fist touches the wall. 3. With your left / right elbow straight, gently press the top of your fist into the wall. Gradually increase the pressure until you are pressing as hard as you can without shrugging your shoulder. 4. Hold for __________ seconds. 5. Slowly release the tension and relax your muscles completely before you repeat the exercise. Repeat __________ times. Complete this exercise __________ times a day. Exercise H: Shoulder abduction, isometric   1. Stand or sit about 4-6 inches (10-15 cm) away from a wall with your right/left side facing the wall. 2. Bend your left / right elbow and gently press your elbow into the wall as if you are trying to move your arm out to your side. Increase the pressure gradually until you are pressing as hard as you can without shrugging your shoulder. 3. Hold for __________ seconds. 4. Slowly release the tension and relax your muscles completely before repeating the exercise. Repeat __________ times. Complete this exercise __________ times a day. Exercise I:  Internal rotation, isometric   1. Stand or sit in a doorway, facing the door frame. 2. Bend your left / right elbow and place the palm of your hand against the door frame. Only your palm should be touching the frame. Keep your upper arm at your side. 3. Gently press your hand into the door frame, as if you are trying to push your arm toward your abdomen. Do not let your wrist bend.  Avoid shrugging your shoulder while you press your hand into the door frame. Keep your shoulder blade tucked down toward the middle of your back. 4. Hold for __________ seconds. 5. Slowly release the tension, and relax your muscles completely before you repeat the exercise. Repeat __________ times. Complete this exercise __________ times a day. Exercise J: External rotation, isometric   1. Stand or sit in a doorway, facing the door frame. 2. Bend your left / right elbow and place the back of your wrist against the door frame. Only the back of your wrist should be touching the frame. Keep your upper arm at your side. 3. Gently press your wrist against the door frame, as if you are trying to push your arm away from your abdomen.  Avoid shrugging your shoulder while you press your wrist into the door frame. Keep your shoulder blade tucked down toward the middle of your back. 4. Hold for __________ seconds. 5. Slowly release the tension, and relax your muscles completely before you repeat the exercise. Repeat __________ times. Complete this exercise __________ times a day. This information is not intended to replace advice given to you by your health care provider. Make sure you discuss any questions you have with your health care provider. Document Released: 07/21/2005 Document Revised: 03/27/2016 Document Reviewed: 08/04/2015 Elsevier Interactive Patient Education  2017 [place].  Surgery for Shoulder Impingement Syndrome, Care After Refer to this sheet in the next few weeks. These instructions provide you  with information about caring for yourself after your procedure. Your health care provider may also give you more specific instructions. Your treatment has been planned according to current medical practices, but problems sometimes occur. Call your health care provider if you have any problems or questions after your procedure. What can I expect after the procedure? After your procedure, it is common to have pain and stiffness in your shoulder area. This  may include your arm, chest, and back. Follow these instructions at home: If you have a sling:   Wear the sling as told by your health care provider. Remove it only as told by your health care provider.  Loosen the sling if your fingers tingle, become numb, or turn cold and blue.  Do not let your sling get wet if it is not waterproof.  Keep the sling clean. Bathing   Do not take baths, swim, or use a hot tub until your health care provider approves. Ask your health care provider if you can take showers. You may only be allowed to take sponge baths for bathing.  If you have a sling that is not waterproof, cover it with a watertight covering when you take a bath or a shower.  Keep your bandage (dressing) dry until your health care provider says it can be removed. Incision care    Check your incision area every day for signs of infection. Check for:  More redness, swelling, or pain.  More fluid or blood.  Warmth.  Pus or a bad smell.  Follow instructions from your health care provider about how to take care of your incision. Make sure you:  Wash your hands with soap and water before you change your bandage (dressing). If soap and water are not available, use hand sanitizer.  Change your dressing as told by your health care provider.  Leave stitches (sutures), skin glue, or adhesive strips in place. These skin closures may need to be in place for 2 weeks or longer. If adhesive strip edges start to loosen and curl up, you may trim  the loose edges. Do not remove adhesive strips completely unless your health care provider tells you to do that. Driving   Do not drive or operate heavy machinery while taking prescription pain medicine.  Do not drive for 24 hours if you received a sedative.  Ask your health care provider when it is safe to drive if you have a sling on your arm. Managing pain, stiffness, and swelling    Move your fingers often to avoid stiffness and to lessen swelling.  Keep your arm and shoulder in the position recommended by your health care provider.  If directed, put ice on your shoulder.  Put ice in a plastic bag.  Place a towel between your skin and the bag.  Leave the ice on for 20 minutes, 2-3 times a day. Activity   Return to your normal activities as told by your health care provider. Ask your health care provider what activities are safe for you.  Do not lift anything that is heavier than 10 lb (4.5 kg) until your health care provider tells you that it is safe.  Do exercises and stretches as told by your health care provider. General instructions    Take over-the-counter and prescription medicines only as told by your health care provider.  Do not use any tobacco products, such as cigarettes, chewing tobacco, and e-cigarettes. If you need help quitting, ask your health care provider.  Keep all follow-up visits as told by your health care provider. This is important. Contact a health care provider if:  You have a fever.  You have pain that gets worse or does not get better with medicine.  You have more redness, swelling, or pain around your incision.  You have more fluid or blood coming from your incision.  Your incision feels warm to the touch.  You have pus or a bad smell  coming from your incision.  You have muscle aches.  You are dizzy. Get help right away if:  You have severe pain in your shoulder, arm, or hand.  Your hand or arm becomes numb.  Your hand or  arm feels unusually cold.  Your fingernails turn a dark color, such as blue or gray. This information is not intended to replace advice given to you by your health care provider. Make sure you discuss any questions you have with your health care provider. Document Released: 11/12/2015 Document Revised: 12/27/2015 Document Reviewed: 07/07/2015 Elsevier Interactive Patient Education  2017 [place] Anesthesia, Adult General anesthesia is the use of medicines to make a person "go to sleep" (be unconscious) for a medical procedure. General anesthesia is often recommended when a procedure:  Is long.  Requires you to be still or in an unusual position.  Is major and can cause you to lose blood.  Is impossible to do without general anesthesia. The medicines used for general anesthesia are called general anesthetics. In addition to making you sleep, the medicines:  Prevent pain.  Control your blood pressure.  Relax your muscles. Tell a health care provider about:  Any allergies you have.  All medicines you are taking, including vitamins, herbs, eye drops, creams, and over-the-counter medicines.  Any problems you or family members have had with anesthetic medicines.  Types of anesthetics you have had in the past.  Any bleeding disorders you have.  Any surgeries you have had.  Any medical conditions you have.  Any history of heart or lung conditions, such as heart failure, sleep apnea, or chronic obstructive pulmonary disease (COPD).  Whether you are pregnant or may be pregnant.  Whether you use tobacco, alcohol, marijuana, or street drugs.  Any history of Armed forces logistics/support/administrative officer.  Any history of depression or anxiety. What are the risks? Generally, this is a safe procedure. However, problems may occur, including:  Allergic reaction to anesthetics.  Lung and heart problems.  Inhaling food or liquids from your stomach into your lungs (aspiration).  Injury to  nerves.  Waking up during your procedure and being unable to move (rare).  Extreme agitation or a state of mental confusion (delirium) when you wake up from the anesthetic.  Air in the bloodstream, which can lead to stroke. These problems are more likely to develop if you are having a major surgery or if you have an advanced medical condition. You can prevent some of these complications by answering all of your health care provider's questions thoroughly and by following all pre-procedure instructions. General anesthesia can cause side effects, including:  Nausea or vomiting  A sore throat from the breathing tube.  Feeling cold or shivery.  Feeling tired, washed out, or achy.  Sleepiness or drowsiness.  Confusion or agitation. What happens before the procedure? Staying hydrated  Follow instructions from your health care provider about hydration, which may include:  Up to 2 hours before the procedure - you may continue to drink clear liquids, such as water, clear fruit juice, black coffee, and plain tea. Eating and drinking restrictions  Follow instructions from your health care provider about eating and drinking, which may include:  8 hours before the procedure - stop eating heavy meals or foods such as meat, fried foods, or fatty foods.  6 hours before the procedure - stop eating light meals or foods, such as toast or cereal.  6 hours before the procedure - stop drinking milk or drinks that contain milk.  2 hours before the procedure - stop drinking clear liquids. Medicines   Ask your health care provider about:  Changing or stopping your regular medicines. This is especially important if you are taking diabetes medicines or blood thinners.  Taking medicines such as aspirin and ibuprofen. These medicines can thin your blood. Do not take these medicines before your procedure if your health care provider instructs you not to.  Taking new dietary supplements or medicines. Do  not take these during the week before your procedure unless your health care provider approves them.  If you are told to take a medicine or to continue taking a medicine on the day of the procedure, take the medicine with sips of water. General instructions    Ask if you will be going home the same day, the following day, or after a longer hospital stay.  Plan to have someone take you home.  Plan to have someone stay with you for the first 24 hours after you leave the hospital or clinic.  For 3-6 weeks before the procedure, try not to use any tobacco products, such as cigarettes, chewing tobacco, and e-cigarettes.  You may brush your teeth on the morning of the procedure, but make sure to spit out the toothpaste. What happens during the procedure?  You will be given anesthetics through a mask and through an IV tube in one of your veins.  You may receive medicine to help you relax (sedative).  As soon as you are asleep, a breathing tube may be used to help you breathe.  An anesthesia specialist will stay with you throughout the procedure. He or she will help keep you comfortable and safe by continuing to give you medicines and adjusting the amount of medicine that you get. He or she will also watch your blood pressure, pulse, and oxygen levels to make sure that the anesthetics do not cause any problems.  If a breathing tube was used to help you breathe, it will be removed before you wake up. The procedure may vary among health care providers and hospitals. What happens after the procedure?  You will wake up, often slowly, after the procedure is complete, usually in a recovery area.  Your blood pressure, heart rate, breathing rate, and blood oxygen level will be monitored until the medicines you were given have worn off.  You may be given medicine to help you calm down if you feel anxious or agitated.  If you will be going home the same day, your health care provider may check to  make sure you can stand, drink, and urinate.  Your health care providers will treat your pain and side effects before you go home.  Do not drive for 24 hours if you received a sedative.  You may:  Feel nauseous and vomit.  Have a sore throat.  Have mental slowness.  Feel cold or shivery.  Feel sleepy.  Feel tired.  Feel sore or achy, even in parts of your body where you did not have surgery. This information is not intended to replace advice given to you by your health care provider. Make sure you discuss any questions you have with your health care provider. Document Released: 10/28/2007 Document Revised: 01/01/2016 Document Reviewed: 07/05/2015 Elsevier Interactive Patient Education  2017 [place] Anesthesia, Adult, Care After These instructions provide you with information about caring for yourself after your procedure. Your health care provider may also give you more specific instructions. Your treatment has been planned according to  current medical practices, but problems sometimes occur. Call your health care provider if you have any problems or questions after your procedure. What can I expect after the procedure? After the procedure, it is common to have:  Vomiting.  A sore throat.  Mental slowness. It is common to feel:  Nauseous.  Cold or shivery.  Sleepy.  Tired.  Sore or achy, even in parts of your body where you did not have surgery. Follow these instructions at home: For at least 24 hours after the procedure:   Do not:  Participate in activities where you could fall or become injured.  Drive.  Use heavy machinery.  Drink alcohol.  Take sleeping pills or medicines that cause drowsiness.  Make important decisions or sign legal documents.  Take care of children on your own.  Rest. Eating and drinking   If you vomit, drink water, juice, or soup when you can drink without vomiting.  Drink enough fluid to keep your urine clear  or pale yellow.  Make sure you have little or no nausea before eating solid foods.  Follow the diet recommended by your health care provider. General instructions   Have a responsible adult stay with you until you are awake and alert.  Return to your normal activities as told by your health care provider. Ask your health care provider what activities are safe for you.  Take over-the-counter and prescription medicines only as told by your health care provider.  If you smoke, do not smoke without supervision.  Keep all follow-up visits as told by your health care provider. This is important. Contact a health care provider if:  You continue to have nausea or vomiting at home, and medicines are not helpful.  You cannot drink fluids or start eating again.  You cannot urinate after 8-12 hours.  You develop a skin rash.  You have fever.  You have increasing redness at the site of your procedure. Get help right away if:  You have difficulty breathing.  You have chest pain.  You have unexpected bleeding.  You feel that you are having a life-threatening or urgent problem. This information is not intended to replace advice given to you by your health care provider. Make sure you discuss any questions you have with your health care provider. Document Released: 10/27/2000 Document Revised: 12/24/2015 Document Reviewed: 07/05/2015 Elsevier Interactive Patient Education  2017 [employer].

## 2016-10-17 ENCOUNTER — Other Ambulatory Visit: Payer: Self-pay

## 2016-10-17 ENCOUNTER — Encounter (HOSPITAL_COMMUNITY): Payer: Self-pay

## 2016-10-17 ENCOUNTER — Encounter (HOSPITAL_COMMUNITY)
Admission: RE | Admit: 2016-10-17 | Discharge: 2016-10-17 | Disposition: A | Discharge status: Home / Self Care | Payer: Medicare Other | Source: Ambulatory Visit | Attending: Orthopedic Surgery | Admitting: Orthopedic Surgery

## 2016-10-17 DIAGNOSIS — M75122 Complete rotator cuff tear or rupture of left shoulder, not specified as traumatic: Secondary | ICD-10-CM | POA: Insufficient documentation

## 2016-10-17 LAB — CBC WITH DIFFERENTIAL/PLATELET
Basophils Absolute: 0 10*3/uL (ref 0.0–0.1)
Basophils Relative: 0 %
EOS PCT: 2 %
Eosinophils Absolute: 0.1 10*3/uL (ref 0.0–0.7)
HCT: 42.9 % (ref 36.0–46.0)
Hemoglobin: 13.8 g/dL (ref 12.0–15.0)
LYMPHS PCT: 32 %
Lymphs Abs: 1.7 10*3/uL (ref 0.7–4.0)
MCH: 29.9 pg (ref 26.0–34.0)
MCHC: 32.2 g/dL (ref 30.0–36.0)
MCV: 92.9 fL (ref 78.0–100.0)
Monocytes Absolute: 0.7 10*3/uL (ref 0.1–1.0)
Monocytes Relative: 13 %
Neutro Abs: 2.9 10*3/uL (ref 1.7–7.7)
Neutrophils Relative %: 53 %
PLATELETS: 214 10*3/uL (ref 150–400)
RBC: 4.62 MIL/uL (ref 3.87–5.11)
RDW: 14.5 % (ref 11.5–15.5)
WBC: 5.4 10*3/uL (ref 4.0–10.5)

## 2016-10-17 LAB — BASIC METABOLIC PANEL
Anion gap: 8 (ref 5–15)
BUN: 12 mg/dL (ref 6–20)
CHLORIDE: 101 mmol/L (ref 101–111)
CO2: 28 mmol/L (ref 22–32)
Calcium: 9.4 mg/dL (ref 8.9–10.3)
Creatinine, Ser: 0.67 mg/dL (ref 0.44–1.00)
GFR calc Af Amer: >60 mL/min (ref >60)
GFR calc non Af Amer: >60 mL/min (ref >60)
GLUCOSE: 92 mg/dL (ref 65–99)
POTASSIUM: 3.7 mmol/L (ref 3.5–5.1)
Sodium: 137 mmol/L (ref 135–145)

## 2016-10-21 NOTE — H&P (Signed)
History and physical for surgery left shoulder  Pain left shoulder 6 months   HPI [name] is a 81 y.o. female.  Presents back for evaluation of left shoulder pain she's had for the last 6 months she received a cortisone injection took Tylenol continues to have pain over the left upper shoulder painful range of motion night pain weakness with some radiation into the upper left forearm which is described as a dull aching pain   Review of Systems Review of Systems She denies numbness or tingling in the left upper extremity   No shortness of breath or chest pain  She has no weight loss mild blurred vision  No headaches or swallowing episodes of difficulty  No heartburn he can see urgency joint instability rashes dizziness she does have a tremor  No anxiety or depression denies easy bruising denies excessive thirst or urination or specific food allergy   Past Medical History:  Diagnosis Date  . Anemia      history  . Anxiety    . Atrial fibrillation    . Chronic back pain    . COPD (chronic obstructive pulmonary disease) ([place])    . Coronary atherosclerosis of native coronary artery      DES x 2 to RCA 10/10  . 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 Essential hypertension, benign    . GERD (gastroesophageal reflux disease)    . H/O hiatal hernia    . Headache(784.0)    . HOH (hard of hearing)    . Hyperlipidemia    . Neuromuscular disorder ([place])      Tremors  . Pericardial effusion      Hemorrhagic   . Presence of permanent cardiac pacemaker    . Resting tremor    . Tachycardia-bradycardia syndrome ([place])      s/p Medtronic Adapta L dual chamber device  5/10           Past Surgical History:  Procedure Laterality Date  . APPENDECTOMY      . CARDIAC CATHETERIZATION        stents x2.  Marland Kitchen CATARACT EXTRACTION      . CHOLECYSTECTOMY      . COLONOSCOPY   2011  . COLONOSCOPY N/A 10/19/2014    Procedure: COLONOSCOPY;  Surgeon: Rogene Houston,  MD;  Location: [facility];  Service: Endoscopy;  Laterality: N/A;  1030  . ESOPHAGOGASTRODUODENOSCOPY N/A 10/26/2014    Procedure: ESOPHAGOGASTRODUODENOSCOPY (EGD);  Surgeon: Rogene Houston, MD;  Location: [facility];  Service: Endoscopy;  Laterality: N/A;  230 - Dr. has lunch and learn  . Esophagogastroduodenoscopy with esophageal dilation   2004, 2006, 2007  . GIVENS CAPSULE STUDY N/A 10/31/2014    Procedure: GIVENS CAPSULE STUDY;  Surgeon: Rogene Houston, MD;  Location: [facility];  Service: Endoscopy;  Laterality: N/A;  730 -- pacemaker--needs monitoring--outpatient bed  . INSERT / REPLACE / REMOVE PACEMAKER      . LEFT HEART CATHETERIZATION WITH CORONARY ANGIOGRAM N/A 11/17/2011    Procedure: LEFT HEART CATHETERIZATION WITH CORONARY ANGIOGRAM;  Surgeon: Sherren Mocha, MD;  Location: [facility] CATH LAB;  Service: Cardiovascular;  Laterality: N/A;  . LEFT HEART CATHETERIZATION WITH CORONARY ANGIOGRAM N/A 09/26/2014    Procedure: LEFT HEART CATHETERIZATION WITH CORONARY ANGIOGRAM;  Surgeon: Leonie Man, MD;  Location: [facility] CATH LAB;  Service: Cardiovascular;  Laterality: N/A;  . Right rotator cuff repair      . SHOULDER  ACROMIOPLASTY Right 05/30/2015    Procedure: RIGHT SHOULDER ACROMIOPLASTY;  Surgeon: Carole Civil, MD;  Location: [facility] ORS;  Service: Orthopedics;  Laterality: Right;  . SHOULDER OPEN ROTATOR CUFF REPAIR Right 05/30/2015    Procedure: OPEN ROTATOR CUFF REPAIR RIGHT SHOULDER;  Surgeon: Carole Civil, MD;  Location: [facility] ORS;  Service: Orthopedics;  Laterality: Right;  . Subxiphoid pericardial window   11/10  . VAGINAL HYSTERECTOMY          Social History      Social History  Substance Use Topics  . Smoking status: Former Smoker      Packs/day: 2.00      Years: 10.00      Types: Cigarettes      Quit date: 05/23/1989  . Smokeless tobacco: Never Used  . Alcohol use No      Family History  Problem Relation Age of Onset  . Cancer Mother     Colon   .  Coronary artery disease Sister   . Coronary artery disease Brother   . Arthritis    . Lung disease    . Asthma           Allergies  Allergen Reactions  . Amitriptyline Hcl Other (See Comments)      Caused "jaws to twist and lock"  . Sulfonamide Derivatives Other (See Comments)      UNKNOWN REACTION      No current facility-administered medications for this encounter.   Current Outpatient Prescriptions:  .  acetaminophen (TYLENOL) 500 MG tablet, Take 500 mg by mouth every 6 (six) hours as needed for mild pain or moderate pain., Disp: , Rfl:  .  ALPRAZolam (XANAX) 1 MG tablet, Take 1 mg by mouth 2 (two) times daily as needed for anxiety or sleep. , Disp: , Rfl:  .  amiodarone (PACERONE) 100 MG tablet, Take 100 mg by mouth daily., Disp: , Rfl:  .  amLODipine (NORVASC) 5 MG tablet, Take 5 mg by mouth at bedtime., Disp: , Rfl:  .  aspirin 81 MG tablet, Take 81 mg by mouth daily., Disp: , Rfl:  .  clopidogrel (PLAVIX) 75 MG tablet, Take 75 mg by mouth daily. , Disp: , Rfl:  .  Cyanocobalamin (B-12 PO), Take 1 tablet by mouth daily. , Disp: , Rfl:  .  esomeprazole (NEXIUM) 40 MG capsule, Take 40 mg by mouth 2 (two) times daily as needed (acid reflux). , Disp: , Rfl:  .  folic acid (FOLVITE) 545 MCG tablet, Take 800 mcg by mouth daily., Disp: , Rfl:  .  furosemide (LASIX) 40 MG tablet, Take 20 mg by mouth See admin instructions. Takes 1 tablet daily and a 2nd tablet only if needed for extra fluid retention, Disp: , Rfl:  .  metoprolol succinate (TOPROL XL) 25 MG 24 hr tablet, Take 2 tablets (50 mg total) by mouth 2 (two) times daily. (Patient taking differently: Take 50 mg by mouth 2 (two) times daily. Takes 2 tablets twice daily), Disp: 120 tablet, Rfl: 11 .  nitroGLYCERIN (NITROSTAT) 0.4 MG SL tablet, Place 0.4 mg under the tongue every 5 (five) minutes as needed for chest pain. , Disp: , Rfl:  .  potassium chloride SA (K-DUR,KLOR-CON) 20 MEQ tablet, Take 20 mEq by mouth See admin  instructions. Takes 1 tablet daily and a 2nd tablet only if has to take a 2nd dose of Lasix, Disp: , Rfl:  .  primidone (MYSOLINE) 50 MG tablet, Take 50 mg by mouth at  bedtime. , Disp: , Rfl:  .  thiamine (VITAMIN B-1) 100 MG tablet, Take 100 mg by mouth daily., Disp: , Rfl:  .  valsartan (DIOVAN) 160 MG tablet, TAKE (1) TABLET BY MOUTH AT BEDTIME., Disp: 30 tablet, Rfl: 0 .  vitamin E 400 UNIT capsule, Take 400 Units by mouth daily., Disp: , Rfl:  .  acetaminophen-codeine (TYLENOL #3) 300-30 MG tablet, Take 1 tablet by mouth every 6 (six) hours as needed for moderate pain. (Patient not taking: Reported on 10/14/2016), Disp: 30 tablet, Rfl: 0 .  HYDROcodone-acetaminophen (NORCO) 5-325 MG tablet, Take 1-2 tablets by mouth every 6 (six) hours as needed. (Patient not taking: Reported on 10/14/2016), Disp: 20 tablet, Rfl: 0 .  Pediatric Multivitamins-Iron (FLINTSTONES PLUS IRON) chewable tablet, Chew 1 tablet by mouth daily. (Patient not taking: Reported on 10/14/2016), Disp: , Rfl:  .  predniSONE (DELTASONE) 10 MG tablet, Take 2 tablets (20 mg total) by mouth 2 (two) times daily. (Patient not taking: Reported on 10/14/2016), Disp: 20 tablet, Rfl: 0 .  traMADol (ULTRAM) 50 MG tablet, Take 1 tablet (50 mg total) by mouth every 6 (six) hours as needed. (Patient not taking: Reported on 10/14/2016), Disp: 60 tablet, Rfl: 0 .  valACYclovir (VALTREX) 1000 MG tablet, Take 1 tablet (1,000 mg total) by mouth 3 (three) times daily. (Patient not taking: Reported on 10/14/2016), Disp: 21 tablet, Rfl: 0       Physical Exam Physical Exam BP (!) 148/88   Pulse 84   Wt 165 lb (74.8 kg)   BMI 26.23 kg/m    Gen. appearance. The patient is well-developed and well-nourished, grooming and hygiene are normal. There are no gross congenital abnormalities   The patient is alert and oriented to person place and time   Mood and affect are normal   Ambulation Normal   Examination reveals the following: On inspection we  find tenderness in the peri-acromial region of the left shoulder with active flexion of only 90 and abduction 80 and external rotation 40   Stability tests are normal she had weakness grade 4 the rotator cuff supraspinatus normal and internal and external rotators. The skin was without rash or ulceration or erythema skin sensation was intact on the left and right hand pulses were good in the left and right arm she had mild tenderness in the cervical spine.     Right shoulder range of motion was near normal status post rotator cuff repair  Lower extremities no contracture subluxation atrophy tremor or major malalignments skin normal pulses intact lymph nodes negative   Data Reviewed  X-ray Bone changes in the greater tuberosity consistent with rotator cuff disease  Arthrogram Initially, at rest, no contrast extravasation from the LEFT glenohumeral joint was identified.   Following movement of the LEFT arm/shoulder, contrast is seen extravasated into the LEFT subacromial subdeltoid bursa through a full- thickness tear of the rotator cuff.   IMPRESSION: Full-thickness rotator cuff tear LEFT shoulder.     Electronically Signed   By: Lavonia Dana M.D.   On: 09/05/2016 11:30   Assessment    Complete tear left rotator cuff   Plan    The patient consented for left rotator cuff repair This procedure has been fully reviewed with the patient and written informed consent has been obtained.

## 2016-10-22 ENCOUNTER — Ambulatory Visit (HOSPITAL_COMMUNITY)
Admission: RE | Admit: 2016-10-22 | Discharge: 2016-10-23 | Disposition: A | Discharge status: Home / Self Care | Payer: Medicare Other | Source: Ambulatory Visit | Attending: Orthopedic Surgery | Admitting: Orthopedic Surgery

## 2016-10-22 ENCOUNTER — Ambulatory Visit (HOSPITAL_COMMUNITY): Payer: Medicare Other | Admitting: Anesthesiology

## 2016-10-22 ENCOUNTER — Encounter (HOSPITAL_COMMUNITY): Payer: Self-pay | Admitting: *Deleted

## 2016-10-22 ENCOUNTER — Encounter (HOSPITAL_COMMUNITY)
Admission: RE | Disposition: A | Discharge status: Home / Self Care | Payer: Self-pay | Source: Ambulatory Visit | Attending: Orthopedic Surgery

## 2016-10-22 DIAGNOSIS — M75122 Complete rotator cuff tear or rupture of left shoulder, not specified as traumatic: Secondary | ICD-10-CM | POA: Diagnosis not present

## 2016-10-22 DIAGNOSIS — Z955 Presence of coronary angioplasty implant and graft: Secondary | ICD-10-CM | POA: Insufficient documentation

## 2016-10-22 DIAGNOSIS — F419 Anxiety disorder, unspecified: Secondary | ICD-10-CM | POA: Diagnosis not present

## 2016-10-22 DIAGNOSIS — M75102 Unspecified rotator cuff tear or rupture of left shoulder, not specified as traumatic: Secondary | ICD-10-CM | POA: Diagnosis not present

## 2016-10-22 DIAGNOSIS — J449 Chronic obstructive pulmonary disease, unspecified: Secondary | ICD-10-CM | POA: Insufficient documentation

## 2016-10-22 DIAGNOSIS — I10 Essential (primary) hypertension: Secondary | ICD-10-CM | POA: Diagnosis not present

## 2016-10-22 DIAGNOSIS — Z7982 Long term (current) use of aspirin: Secondary | ICD-10-CM | POA: Insufficient documentation

## 2016-10-22 DIAGNOSIS — K219 Gastro-esophageal reflux disease without esophagitis: Secondary | ICD-10-CM | POA: Diagnosis not present

## 2016-10-22 DIAGNOSIS — Z7902 Long term (current) use of antithrombotics/antiplatelets: Secondary | ICD-10-CM | POA: Insufficient documentation

## 2016-10-22 DIAGNOSIS — I2510 Atherosclerotic heart disease of native coronary artery without angina pectoris: Secondary | ICD-10-CM | POA: Insufficient documentation

## 2016-10-22 DIAGNOSIS — Z950 Presence of cardiac pacemaker: Secondary | ICD-10-CM | POA: Insufficient documentation

## 2016-10-22 DIAGNOSIS — Z87891 Personal history of nicotine dependence: Secondary | ICD-10-CM | POA: Insufficient documentation

## 2016-10-22 DIAGNOSIS — I495 Sick sinus syndrome: Secondary | ICD-10-CM | POA: Insufficient documentation

## 2016-10-22 DIAGNOSIS — I4891 Unspecified atrial fibrillation: Secondary | ICD-10-CM | POA: Diagnosis not present

## 2016-10-22 HISTORY — PX: SHOULDER OPEN ROTATOR CUFF REPAIR: SHX2407

## 2016-10-22 SURGERY — REPAIR, ROTATOR CUFF, OPEN
Anesthesia: General | Laterality: Left

## 2016-10-22 MED ORDER — GLYCOPYRROLATE 0.2 MG/ML IJ SOLN
INTRAMUSCULAR | Status: DC | PRN
  Administered 2016-10-22: .6 mg via INTRAVENOUS

## 2016-10-22 MED ORDER — IRBESARTAN 150 MG PO TABS
150.0000 mg | ORAL_TABLET | Freq: Every day | ORAL | Status: DC
  Administered 2016-10-23: 150 mg via ORAL
  Filled 2016-10-22: qty 1

## 2016-10-22 MED ORDER — DIPHENHYDRAMINE HCL 12.5 MG/5ML PO ELIX: 12.5000 mg | ORAL_SOLUTION | ORAL | Status: DC | PRN

## 2016-10-22 MED ORDER — ONDANSETRON HCL 4 MG/2ML IJ SOLN
INTRAMUSCULAR | Status: AC
  Filled 2016-10-22: qty 2

## 2016-10-22 MED ORDER — VITAMIN E 180 MG (400 UNIT) PO CAPS
400.0000 [IU] | ORAL_CAPSULE | Freq: Every day | ORAL | Status: DC
  Administered 2016-10-23: 400 [IU] via ORAL
  Filled 2016-10-22 (×3): qty 1

## 2016-10-22 MED ORDER — METOCLOPRAMIDE HCL 5 MG/ML IJ SOLN
5.0000 mg | Freq: Three times a day (TID) | INTRAMUSCULAR | Status: DC | PRN
  Administered 2016-10-22: 10 mg via INTRAVENOUS
  Filled 2016-10-22: qty 2

## 2016-10-22 MED ORDER — CEFAZOLIN SODIUM-DEXTROSE 2-4 GM/100ML-% IV SOLN
2.0000 g | Freq: Four times a day (QID) | INTRAVENOUS | Status: AC
  Administered 2016-10-22 – 2016-10-23 (×3): 2 g via INTRAVENOUS
  Filled 2016-10-22 (×3): qty 100

## 2016-10-22 MED ORDER — MENTHOL 3 MG MT LOZG: 1.0000 | LOZENGE | OROMUCOSAL | Status: DC | PRN

## 2016-10-22 MED ORDER — BUPIVACAINE-EPINEPHRINE (PF) 0.5% -1:200000 IJ SOLN
INTRAMUSCULAR | Status: DC | PRN
  Administered 2016-10-22: 60 mL via PERINEURAL

## 2016-10-22 MED ORDER — LIDOCAINE HCL (PF) 1 % IJ SOLN
INTRAMUSCULAR | Status: AC
  Filled 2016-10-22: qty 10

## 2016-10-22 MED ORDER — ACETAMINOPHEN 500 MG PO TABS: 500.0000 mg | ORAL_TABLET | Freq: Four times a day (QID) | ORAL | Status: DC | PRN

## 2016-10-22 MED ORDER — FOLIC ACID 1 MG PO TABS
1.0000 mg | ORAL_TABLET | Freq: Every day | ORAL | Status: DC
  Administered 2016-10-23: 1 mg via ORAL
  Filled 2016-10-22: qty 1

## 2016-10-22 MED ORDER — METOCLOPRAMIDE HCL 5 MG/ML IJ SOLN: 5.0000 mg | Freq: Three times a day (TID) | INTRAMUSCULAR | Status: DC | PRN

## 2016-10-22 MED ORDER — SODIUM CHLORIDE 0.9 % IV SOLN
INTRAVENOUS | Status: AC
  Administered 2016-10-22: 13:00:00 via INTRAVENOUS

## 2016-10-22 MED ORDER — METOCLOPRAMIDE HCL 5 MG PO TABS
5.0000 mg | ORAL_TABLET | Freq: Three times a day (TID) | ORAL | Status: DC | PRN
  Filled 2016-10-22: qty 2

## 2016-10-22 MED ORDER — ROCURONIUM 10MG/ML (10ML) SYRINGE FOR MEDFUSION PUMP - OPTIME
INTRAVENOUS | Status: DC | PRN
  Administered 2016-10-22: 5 mg via INTRAVENOUS
  Administered 2016-10-22: 30 mg via INTRAVENOUS
  Administered 2016-10-22: 10 mg via INTRAVENOUS

## 2016-10-22 MED ORDER — MIDAZOLAM HCL 2 MG/2ML IJ SOLN
1.0000 mg | INTRAMUSCULAR | Status: AC
  Administered 2016-10-22 (×2): 1 mg via INTRAVENOUS

## 2016-10-22 MED ORDER — ALPRAZOLAM 1 MG PO TABS
1.0000 mg | ORAL_TABLET | Freq: Two times a day (BID) | ORAL | Status: DC | PRN
  Administered 2016-10-23: 1 mg via ORAL
  Filled 2016-10-22: qty 1

## 2016-10-22 MED ORDER — FENTANYL CITRATE (PF) 250 MCG/5ML IJ SOLN
INTRAMUSCULAR | Status: AC
  Filled 2016-10-22: qty 5

## 2016-10-22 MED ORDER — MIDAZOLAM HCL 2 MG/2ML IJ SOLN
INTRAMUSCULAR | Status: AC
  Filled 2016-10-22: qty 2

## 2016-10-22 MED ORDER — LACTATED RINGERS IV SOLN
INTRAVENOUS | Status: DC
  Administered 2016-10-22: 08:00:00 via INTRAVENOUS

## 2016-10-22 MED ORDER — AMLODIPINE BESYLATE 5 MG PO TABS
5.0000 mg | ORAL_TABLET | Freq: Every day | ORAL | Status: DC
  Administered 2016-10-22: 5 mg via ORAL
  Filled 2016-10-22: qty 1

## 2016-10-22 MED ORDER — LIDOCAINE HCL (PF) 1 % IJ SOLN
INTRAMUSCULAR | Status: AC
  Filled 2016-10-22: qty 5

## 2016-10-22 MED ORDER — PANTOPRAZOLE SODIUM 40 MG PO TBEC
80.0000 mg | DELAYED_RELEASE_TABLET | Freq: Every day | ORAL | Status: DC
  Administered 2016-10-23: 80 mg via ORAL
  Filled 2016-10-22: qty 2

## 2016-10-22 MED ORDER — HYDROCODONE-ACETAMINOPHEN 5-325 MG PO TABS
1.0000 | ORAL_TABLET | ORAL | Status: DC | PRN
  Administered 2016-10-23 (×2): 1 via ORAL
  Filled 2016-10-22 (×2): qty 1

## 2016-10-22 MED ORDER — VITAMIN B-1 100 MG PO TABS
100.0000 mg | ORAL_TABLET | Freq: Every day | ORAL | Status: DC
  Administered 2016-10-23: 100 mg via ORAL
  Filled 2016-10-22: qty 1

## 2016-10-22 MED ORDER — AMIODARONE HCL 200 MG PO TABS
100.0000 mg | ORAL_TABLET | Freq: Every day | ORAL | Status: DC
  Administered 2016-10-23: 100 mg via ORAL
  Filled 2016-10-22 (×3): qty 1

## 2016-10-22 MED ORDER — DOCUSATE SODIUM 100 MG PO CAPS
100.0000 mg | ORAL_CAPSULE | Freq: Two times a day (BID) | ORAL | Status: DC
  Administered 2016-10-22 – 2016-10-23 (×2): 100 mg via ORAL
  Filled 2016-10-22 (×2): qty 1

## 2016-10-22 MED ORDER — FUROSEMIDE 20 MG PO TABS
20.0000 mg | ORAL_TABLET | Freq: Every day | ORAL | Status: DC
  Administered 2016-10-23: 20 mg via ORAL
  Filled 2016-10-22: qty 1

## 2016-10-22 MED ORDER — ETOMIDATE 2 MG/ML IV SOLN
INTRAVENOUS | Status: DC | PRN
  Administered 2016-10-22: 10 mg via INTRAVENOUS

## 2016-10-22 MED ORDER — ONDANSETRON HCL 4 MG PO TABS: 4.0000 mg | ORAL_TABLET | Freq: Four times a day (QID) | ORAL | Status: DC | PRN

## 2016-10-22 MED ORDER — SUCCINYLCHOLINE CHLORIDE 20 MG/ML IJ SOLN
INTRAMUSCULAR | Status: AC
  Filled 2016-10-22: qty 1

## 2016-10-22 MED ORDER — CHLORHEXIDINE GLUCONATE 4 % EX LIQD: 60.0000 mL | Freq: Once | CUTANEOUS | Status: DC

## 2016-10-22 MED ORDER — ROCURONIUM BROMIDE 50 MG/5ML IV SOLN
INTRAVENOUS | Status: AC
  Filled 2016-10-22: qty 1

## 2016-10-22 MED ORDER — CLOPIDOGREL BISULFATE 75 MG PO TABS
75.0000 mg | ORAL_TABLET | Freq: Every day | ORAL | Status: DC
  Administered 2016-10-23: 75 mg via ORAL
  Filled 2016-10-22: qty 1

## 2016-10-22 MED ORDER — 0.9 % SODIUM CHLORIDE (POUR BTL) OPTIME
TOPICAL | Status: DC | PRN
  Administered 2016-10-22: 1000 mL

## 2016-10-22 MED ORDER — PHENYLEPHRINE HCL 10 MG/ML IJ SOLN
INTRAMUSCULAR | Status: DC | PRN
  Administered 2016-10-22: 40 ug via INTRAVENOUS
  Administered 2016-10-22: 80 ug via INTRAVENOUS
  Administered 2016-10-22: 40 ug via INTRAVENOUS

## 2016-10-22 MED ORDER — METOPROLOL SUCCINATE ER 50 MG PO TB24
50.0000 mg | ORAL_TABLET | Freq: Two times a day (BID) | ORAL | Status: DC
  Administered 2016-10-22 – 2016-10-23 (×2): 50 mg via ORAL
  Filled 2016-10-22 (×2): qty 1

## 2016-10-22 MED ORDER — NEOSTIGMINE METHYLSULFATE 10 MG/10ML IV SOLN
INTRAVENOUS | Status: DC | PRN
  Administered 2016-10-22: 3 mg via INTRAVENOUS

## 2016-10-22 MED ORDER — MORPHINE SULFATE (PF) 2 MG/ML IV SOLN: 1.0000 mg | INTRAVENOUS | Status: DC | PRN

## 2016-10-22 MED ORDER — ONDANSETRON HCL 4 MG/2ML IJ SOLN
4.0000 mg | Freq: Four times a day (QID) | INTRAMUSCULAR | Status: DC | PRN
  Administered 2016-10-22: 4 mg via INTRAVENOUS
  Filled 2016-10-22: qty 2

## 2016-10-22 MED ORDER — VITAMIN B-12 100 MCG PO TABS
100.0000 ug | ORAL_TABLET | Freq: Every day | ORAL | Status: DC
  Administered 2016-10-23: 100 ug via ORAL
  Filled 2016-10-22 (×3): qty 1

## 2016-10-22 MED ORDER — METOCLOPRAMIDE HCL 10 MG PO TABS: 5.0000 mg | ORAL_TABLET | Freq: Three times a day (TID) | ORAL | Status: DC | PRN

## 2016-10-22 MED ORDER — FENTANYL CITRATE (PF) 100 MCG/2ML IJ SOLN
INTRAMUSCULAR | Status: DC | PRN
  Administered 2016-10-22: 25 ug via INTRAVENOUS
  Administered 2016-10-22 (×2): 50 ug via INTRAVENOUS
  Administered 2016-10-22 (×3): 25 ug via INTRAVENOUS
  Administered 2016-10-22: 50 ug via INTRAVENOUS

## 2016-10-22 MED ORDER — BUPIVACAINE-EPINEPHRINE (PF) 0.5% -1:200000 IJ SOLN
INTRAMUSCULAR | Status: AC
  Filled 2016-10-22: qty 30

## 2016-10-22 MED ORDER — HYDROMORPHONE HCL 1 MG/ML IJ SOLN
0.2500 mg | INTRAMUSCULAR | Status: DC | PRN
  Administered 2016-10-22: 0.5 mg via INTRAVENOUS
  Filled 2016-10-22: qty 0.5

## 2016-10-22 MED ORDER — PRIMIDONE 50 MG PO TABS
50.0000 mg | ORAL_TABLET | Freq: Every day | ORAL | Status: DC
  Administered 2016-10-22: 50 mg via ORAL
  Filled 2016-10-22 (×3): qty 1

## 2016-10-22 MED ORDER — POTASSIUM CHLORIDE CRYS ER 20 MEQ PO TBCR
20.0000 meq | EXTENDED_RELEASE_TABLET | Freq: Every day | ORAL | Status: DC
  Administered 2016-10-22 – 2016-10-23 (×2): 20 meq via ORAL
  Filled 2016-10-22 (×2): qty 1

## 2016-10-22 MED ORDER — ASPIRIN 81 MG PO CHEW
81.0000 mg | CHEWABLE_TABLET | Freq: Every day | ORAL | Status: DC
  Administered 2016-10-23: 81 mg via ORAL
  Filled 2016-10-22: qty 1

## 2016-10-22 MED ORDER — NITROGLYCERIN 0.4 MG SL SUBL: 0.4000 mg | SUBLINGUAL_TABLET | SUBLINGUAL | Status: DC | PRN

## 2016-10-22 MED ORDER — PHENOL 1.4 % MT LIQD: 1.0000 | OROMUCOSAL | Status: DC | PRN

## 2016-10-22 MED ORDER — ROCURONIUM BROMIDE 50 MG/5ML IV SOLN
INTRAVENOUS | Status: AC
  Filled 2016-10-22: qty 2

## 2016-10-22 MED ORDER — ONDANSETRON HCL 4 MG/2ML IJ SOLN
4.0000 mg | Freq: Once | INTRAMUSCULAR | Status: AC
  Administered 2016-10-22: 4 mg via INTRAVENOUS

## 2016-10-22 MED ORDER — KETOROLAC TROMETHAMINE 15 MG/ML IJ SOLN
7.5000 mg | Freq: Four times a day (QID) | INTRAMUSCULAR | Status: AC
  Administered 2016-10-22 – 2016-10-23 (×4): 7.5 mg via INTRAVENOUS
  Filled 2016-10-22 (×4): qty 1

## 2016-10-22 MED ORDER — LIDOCAINE HCL (CARDIAC) 10 MG/ML IV SOLN
INTRAVENOUS | Status: DC | PRN
  Administered 2016-10-22: 40 mg via INTRAVENOUS

## 2016-10-22 MED ORDER — CEFAZOLIN SODIUM-DEXTROSE 2-4 GM/100ML-% IV SOLN
2.0000 g | INTRAVENOUS | Status: AC
  Administered 2016-10-22: 2 g via INTRAVENOUS
  Filled 2016-10-22: qty 100

## 2016-10-22 SURGICAL SUPPLY — 61 items
ADH SKN CLS APL DERMABOND .7 (GAUZE/BANDAGES/DRESSINGS) ×1
ANCH SUT CRKSW FT 1.3X (Anchor) ×1 IMPLANT
ANCH SUT PUSHLCK 19.5X3.5 STRL (Anchor) ×1 IMPLANT
ANCHOR PUSHLOCK PEEK 3.5X19.5 (Anchor) ×2 IMPLANT
ANCHOR SUT BIOCOMP CORKSREW (Anchor) ×2 IMPLANT
BIT DRILL 2.0MX128MM (BIT) ×2 IMPLANT
BLADE HEX COATED 2.75 (ELECTRODE) ×5 IMPLANT
BLADE OSC/SAGITTAL MD 9X18.5 (BLADE) ×3 IMPLANT
BUR FAST CUTTING (BURR)
BUR ROUND 5.0 (BURR) IMPLANT
BUR SRG 54X4.7X12 FLUT (BURR) IMPLANT
BURR SRG 54X4.7X12 FLUT (BURR)
CHLORAPREP W/TINT 26ML (MISCELLANEOUS) ×3 IMPLANT
CLOTH BEACON ORANGE TIMEOUT ST (SAFETY) ×3 IMPLANT
COVER LIGHT HANDLE STERIS (MISCELLANEOUS) ×6 IMPLANT
DERMABOND ADVANCED (GAUZE/BANDAGES/DRESSINGS) ×2
DERMABOND ADVANCED .7 DNX12 (GAUZE/BANDAGES/DRESSINGS) IMPLANT
DRAPE HALF SHEET 40X57 (DRAPES) ×4 IMPLANT
DRAPE PROXIMA HALF (DRAPES) ×3 IMPLANT
DRESSING ALLEVYN BORDER 5X5 (GAUZE/BANDAGES/DRESSINGS) ×3 IMPLANT
ELECT REM PT RETURN 9FT ADLT (ELECTROSURGICAL) ×3
ELECTRODE REM PT RTRN 9FT ADLT (ELECTROSURGICAL) ×1 IMPLANT
GLOVE BIOGEL PI IND STRL 7.0 (GLOVE) ×1 IMPLANT
GLOVE BIOGEL PI INDICATOR 7.0 (GLOVE) ×2
GLOVE SKINSENSE NS SZ8.0 LF (GLOVE) ×2
GLOVE SKINSENSE STRL SZ8.0 LF (GLOVE) ×1 IMPLANT
GLOVE SS N UNI LF 8.5 STRL (GLOVE) ×3 IMPLANT
GOWN STRL REUS W/TWL LRG LVL3 (GOWN DISPOSABLE) ×6 IMPLANT
GOWN STRL REUS W/TWL XL LVL3 (GOWN DISPOSABLE) ×3 IMPLANT
INST SET MINOR BONE (KITS) ×3 IMPLANT
KIT BLADEGUARD II DBL (SET/KITS/TRAYS/PACK) ×3 IMPLANT
KIT ROOM TURNOVER APOR (KITS) ×3 IMPLANT
MANIFOLD NEPTUNE II (INSTRUMENTS) ×3 IMPLANT
MARKER SKIN DUAL TIP RULER LAB (MISCELLANEOUS) ×3 IMPLANT
NDL HYPO 21X1.5 SAFETY (NEEDLE) ×1 IMPLANT
NDL MA TROC 1/2 (NEEDLE) IMPLANT
NDL MAYO 6 CRC TAPER PT (NEEDLE) IMPLANT
NDL SCORPION MULTI FIRE (NEEDLE) IMPLANT
NEEDLE HYPO 21X1.5 SAFETY (NEEDLE) ×3 IMPLANT
NEEDLE MA TROC 1/2 (NEEDLE) IMPLANT
NEEDLE MAYO 6 CRC TAPER PT (NEEDLE) ×3 IMPLANT
NEEDLE SCORPION MULTI FIRE (NEEDLE) ×3 IMPLANT
NS IRRIG 1000ML POUR BTL (IV SOLUTION) ×3 IMPLANT
PACK TOTAL JOINT (CUSTOM PROCEDURE TRAY) ×3 IMPLANT
PAD ARMBOARD 7.5X6 YLW CONV (MISCELLANEOUS) ×3 IMPLANT
PASSER SUT CAPTURE FIRST (SUTURE) IMPLANT
PASSER SUT SWANSON 36MM LOOP (INSTRUMENTS) ×3 IMPLANT
RASP SM TEAR CROSS CUT (RASP) ×2 IMPLANT
SET BASIN LINEN APH (SET/KITS/TRAYS/PACK) ×3 IMPLANT
SLING ARM IMMOBILIZER LRG (SOFTGOODS) ×2 IMPLANT
SLING ARM IMMOBILIZER MED (SOFTGOODS) IMPLANT
STAPLER VISISTAT 35W (STAPLE) IMPLANT
SUT BONE WAX W31G (SUTURE) IMPLANT
SUT ETHIBOND NAB OS 4 #2 30IN (SUTURE) ×3 IMPLANT
SUT ETHILON 3 0 FSL (SUTURE) IMPLANT
SUT MON AB 0 CT1 (SUTURE) ×3 IMPLANT
SUT MON AB 2-0 CT1 36 (SUTURE) ×3 IMPLANT
SUT PROLENE 3 0 PS 1 (SUTURE) IMPLANT
SYR 30ML LL (SYRINGE) ×3 IMPLANT
SYR BULB IRRIGATION 50ML (SYRINGE) ×3 IMPLANT
YANKAUER SUCT 12FT TUBE ARGYLE (SUCTIONS) ×3 IMPLANT

## 2016-10-22 NOTE — Brief Op Note (Signed)
10/22/2016  10:41 AM  PATIENT:  [name]  81 y.o. female  PRE-OPERATIVE DIAGNOSIS:  LEFT ROTATOR CUFF TEAR  POST-OPERATIVE DIAGNOSIS:  LEFT ROTATOR CUFF TEAR  Findings:  Small 1 cm non retracted tear supraspinatus tendon  Implants  1 pushlock  And 1 bioabsorbable suture anchor with 4 sutures   The site was done  The patient was interviewed in the preop area and the chart review was completed and the left shoulder was confirmed as a surgical site was marked. She was taken to surgery. She was given 2 g of Ancef. She had general anesthesia. She was in the modified beachchair position with a sandbag under the left shoulder.  The left arm was then prepped and draped sterilely in the timeout was completed  A longitudinal incision was made over the anterolateral border of the acromion and taken distally 4 cm. Subcutaneous tissue was divided. The raphae between the anterior middle deltoid was split up and over the acromion releasing the anterior and middle deltoid from the acromion for approximately 1 cm. A bursectomy was then performed in the tear was found. The arm was rotated to inspect the shoulder  The tear was 1 cm non-retracted and full thickness  We prepared the acromion with an acromioplasty using a saw and a rasp.  We then decorticated the greater tuberosity. We placed one absorbable suture anchor with 4 sutures including 2 of which were fiber tape and then passed those 4 times and then brought it across the tear and used to push lock anchor to the side of the humerus  This gave an excellent reapproximation and compression of the tendon without any tension  The wound was irrigated.  A drill hole was placed through the acromion and the 2-0 Ethibond suture was used to reapproximate the deltoid. The deltoid split was closed with interrupted 2-0 Ethibond suture. Several 0 Monocryl sutures were used to close the deep fat layer.  The 2-0 Monocryl suture was then used to close the  subcutaneous and Dermabond was placed over the skin. It was allowed to dry and then a dressing was applied  The patient was placed in a sling and then extubated  She was taken to recovery room in stable condition    PROCEDURE:  Procedure(s): ROTATOR CUFF REPAIR SHOULDER OPEN (Left)  SURGEON:  Surgeon(s) and Role:    * Carole Civil, MD - Primary  PHYSICIAN ASSISTANT:   ASSISTANTS: betty ashley    ANESTHESIA:   general  EBL:  Total I/O In: 700 [I.V.:700] Out: 20 [Blood:20]  BLOOD ADMINISTERED:none  DRAINS: none   LOCAL MEDICATIONS USED: marcaine with epi 60 cc  SPECIMEN:  No Specimen  DISPOSITION OF SPECIMEN:  N/A  COUNTS:  YES  TOURNIQUET:  * No tourniquets in log *  DICTATION: .Dragon Dictation  PLAN OF CARE: Admit for overnight observation  PATIENT DISPOSITION:  PACU - hemodynamically stable.   Delay start of Pharmacological VTE agent (>24hrs) due to surgical blood loss or risk of bleeding: not applicable

## 2016-10-22 NOTE — Transfer of Care (Signed)
Immediate Anesthesia Transfer of Care Note  Patient: [name]  Procedure(s) Performed: Procedure(s): ROTATOR CUFF REPAIR SHOULDER OPEN (Left)  Patient Location: PACU  Anesthesia Type:General  Level of Consciousness: awake and alert   Airway & Oxygen Therapy: Patient Spontanous Breathing and Patient connected to face mask oxygen  Post-op Assessment: Report given to RN  Post vital signs: Reviewed and stable  Last Vitals:  Vitals:   10/22/16 0855 10/22/16 0900  BP: 130/76 134/80  Resp: (!) 32 (!) 30  Temp:      Last Pain:  Vitals:   10/22/16 0727  TempSrc: Oral         Complications: No apparent anesthesia complications

## 2016-10-22 NOTE — Anesthesia Preprocedure Evaluation (Signed)
Anesthesia Evaluation  Patient identified by MRN, date of birth, ID band Patient awake    Reviewed: Allergy & Precautions, NPO status , Patient's Chart, lab work & pertinent test results, reviewed documented beta blocker date and time   Airway Mallampati: II  TM Distance: >3 FB Neck ROM: Full    Dental  (+) Upper Dentures, Edentulous Upper, Edentulous Lower   Pulmonary COPD, former smoker,    Pulmonary exam normal        Cardiovascular hypertension, Pt. on medications + angina with exertion + CAD and + Cardiac Stents  + dysrhythmias Atrial Fibrillation + pacemaker III Rhythm:Irregular     Neuro/Psych  Headaches, Anxiety  Neuromuscular disease    GI/Hepatic hiatal hernia, GERD  Medicated and Controlled,  Endo/Other    Renal/GU      Musculoskeletal   Abdominal Normal abdominal exam  (+)   Peds  Hematology  (+) anemia ,   Anesthesia Other Findings   Reproductive/Obstetrics                             Anesthesia Physical Anesthesia Plan  ASA: III  Anesthesia Plan: General   Post-op Pain Management:    Induction: Intravenous  Airway Management Planned: Oral ETT  Additional Equipment:   Intra-op Plan:   Post-operative Plan: Extubation in OR  Informed Consent: I have reviewed the patients History and Physical, chart, labs and discussed the procedure including the risks, benefits and alternatives for the proposed anesthesia with the patient or authorized representative who has indicated his/her understanding and acceptance.     Plan Discussed with: CRNA  Anesthesia Plan Comments:         Anesthesia Quick Evaluation

## 2016-10-22 NOTE — Op Note (Signed)
10/22/2016  10:41 AM  PATIENT:  [name]  81 y.o. female  PRE-OPERATIVE DIAGNOSIS:  LEFT ROTATOR CUFF TEAR  POST-OPERATIVE DIAGNOSIS:  LEFT ROTATOR CUFF TEAR  Cpt 23410   Findings:  Small 1 cm non retracted tear supraspinatus tendon  Implants  1 pushlock  And 1 bioabsorbable suture anchor with 4 sutures   The site was done  The patient was interviewed in the preop area and the chart review was completed and the left shoulder was confirmed as a surgical site was marked. She was taken to surgery. She was given 2 g of Ancef. She had general anesthesia. She was in the modified beachchair position with a sandbag under the left shoulder.  The left arm was then prepped and draped sterilely in the timeout was completed  A longitudinal incision was made over the anterolateral border of the acromion and taken distally 4 cm. Subcutaneous tissue was divided. The raphae between the anterior middle deltoid was split up and over the acromion releasing the anterior and middle deltoid from the acromion for approximately 1 cm. A bursectomy was then performed in the tear was found. The arm was rotated to inspect the shoulder  The tear was 1 cm non-retracted and full thickness  We prepared the acromion with an acromioplasty using a saw and a rasp.  We then decorticated the greater tuberosity. We placed one absorbable suture anchor with 4 sutures including 2 of which were fiber tape and then passed those 4 times and then brought it across the tear and used to push lock anchor to the side of the humerus  This gave an excellent reapproximation and compression of the tendon without any tension  The wound was irrigated.  A drill hole was placed through the acromion and the 2-0 Ethibond suture was used to reapproximate the deltoid. The deltoid split was closed with interrupted 2-0 Ethibond suture. Several 0 Monocryl sutures were used to close the deep fat layer.  The 2-0 Monocryl suture was then  used to close the subcutaneous and Dermabond was placed over the skin. It was allowed to dry and then a dressing was applied  The patient was placed in a sling and then extubated  She was taken to recovery room in stable condition    PROCEDURE:  Procedure(s): ROTATOR CUFF REPAIR SHOULDER OPEN (Left)  SURGEON:  Surgeon(s) and Role:    * Carole Civil, MD - Primary  PHYSICIAN ASSISTANT:   ASSISTANTS: betty ashley    ANESTHESIA:   general  EBL:  Total I/O In: 700 [I.V.:700] Out: 20 [Blood:20]  BLOOD ADMINISTERED:none  DRAINS: none   LOCAL MEDICATIONS USED: marcaine with epi 60 cc  SPECIMEN:  No Specimen  DISPOSITION OF SPECIMEN:  N/A  COUNTS:  YES  TOURNIQUET:  * No tourniquets in log *  DICTATION: .Dragon Dictation  PLAN OF CARE: Admit for overnight observation  PATIENT DISPOSITION:  PACU - hemodynamically stable.   Delay start of Pharmacological VTE agent (>24hrs) due to surgical blood loss or risk of bleeding: not applicable

## 2016-10-22 NOTE — Anesthesia Procedure Notes (Signed)
Procedure Name: Intubation Date/Time: 10/22/2016 9:21 AM Performed by: Tressie Stalker E Pre-anesthesia Checklist: Patient identified, Patient being monitored, Timeout performed, Emergency Drugs available and Suction available Patient Re-evaluated:Patient Re-evaluated prior to inductionOxygen Delivery Method: Circle system utilized Preoxygenation: Pre-oxygenation with 100% oxygen Intubation Type: IV induction Ventilation: Mask ventilation without difficulty Laryngoscope Size: Mac and 3 Grade View: Grade I Tube type: Oral Tube size: 7.0 mm Number of attempts: 1 Airway Equipment and Method: Stylet Placement Confirmation: ETT inserted through vocal cords under direct vision,  positive ETCO2 and breath sounds checked- equal and bilateral Secured at: 21 cm Tube secured with: Tape Dental Injury: Teeth and Oropharynx as per pre-operative assessment

## 2016-10-22 NOTE — Interval H&P Note (Signed)
History and Physical Interval Note:  10/22/2016 7:19 AM  [name]  has presented today for surgery, with the diagnosis of LEFT ROTATOR CUFF TEAR  The various methods of treatment have been discussed with the patient and family. After consideration of risks, benefits and other options for treatment, the patient has consented to  Procedure(s): ROTATOR CUFF REPAIR SHOULDER OPEN (Left) as a surgical intervention .  The patient's history has been reviewed, patient examined, no change in status, stable for surgery.  I have reviewed the patient's chart and labs.  Questions were answered to the patient's satisfaction.     Arther Abbott

## 2016-10-22 NOTE — Anesthesia Postprocedure Evaluation (Signed)
Anesthesia Post Note  Patient: [name]  Procedure(s) Performed: Procedure(s) (LRB): ROTATOR CUFF REPAIR SHOULDER OPEN (Left)  Patient location during evaluation: PACU Anesthesia Type: General Level of consciousness: awake Pain management: satisfactory to patient Vital Signs Assessment: post-procedure vital signs reviewed and stable Respiratory status: spontaneous breathing Cardiovascular status: stable Anesthetic complications: no     Last Vitals:  Vitals:   10/22/16 1115 10/22/16 1130  BP: 125/68 128/69  Pulse: 60 60  Resp: 12 19  Temp:      Last Pain:  Vitals:   10/22/16 1145  TempSrc:   PainSc: 6                  Zita Ozimek

## 2016-10-23 DIAGNOSIS — M75122 Complete rotator cuff tear or rupture of left shoulder, not specified as traumatic: Secondary | ICD-10-CM | POA: Diagnosis not present

## 2016-10-23 DIAGNOSIS — J449 Chronic obstructive pulmonary disease, unspecified: Secondary | ICD-10-CM | POA: Diagnosis not present

## 2016-10-23 DIAGNOSIS — I4891 Unspecified atrial fibrillation: Secondary | ICD-10-CM | POA: Diagnosis not present

## 2016-10-23 DIAGNOSIS — Z7982 Long term (current) use of aspirin: Secondary | ICD-10-CM | POA: Diagnosis not present

## 2016-10-23 DIAGNOSIS — I2510 Atherosclerotic heart disease of native coronary artery without angina pectoris: Secondary | ICD-10-CM | POA: Diagnosis not present

## 2016-10-23 DIAGNOSIS — K219 Gastro-esophageal reflux disease without esophagitis: Secondary | ICD-10-CM | POA: Diagnosis not present

## 2016-10-23 DIAGNOSIS — Z87891 Personal history of nicotine dependence: Secondary | ICD-10-CM | POA: Diagnosis not present

## 2016-10-23 DIAGNOSIS — Z955 Presence of coronary angioplasty implant and graft: Secondary | ICD-10-CM | POA: Diagnosis not present

## 2016-10-23 DIAGNOSIS — Z950 Presence of cardiac pacemaker: Secondary | ICD-10-CM | POA: Diagnosis not present

## 2016-10-23 DIAGNOSIS — I495 Sick sinus syndrome: Secondary | ICD-10-CM | POA: Diagnosis not present

## 2016-10-23 DIAGNOSIS — I10 Essential (primary) hypertension: Secondary | ICD-10-CM | POA: Diagnosis not present

## 2016-10-23 DIAGNOSIS — Z7902 Long term (current) use of antithrombotics/antiplatelets: Secondary | ICD-10-CM | POA: Diagnosis not present

## 2016-10-23 MED ORDER — HYDROCODONE-ACETAMINOPHEN 5-325 MG PO TABS: 1.0000 | ORAL_TABLET | ORAL | 0 refills | Status: DC | PRN

## 2016-10-23 NOTE — Discharge Summary (Signed)
Physician Discharge Summary  Patient ID: [name] MRN: [id number] DOB/AGE: 10/01/1935 81 y.o.  Admit date: 10/22/2016 Discharge date: 10/23/2016  Admission Diagnoses:  Discharge Diagnoses:  Active Problems:   Complete rotator cuff tear of left shoulder   Discharged Condition: good  Hospital Course: surgery was done on 3/21 open rotator cuff repair  She was kept in house for pain control   On 3/22 she was in stable condition   Consults: None Discharge Exam: Blood pressure 127/63, pulse 61, temperature 97.6 F (36.4 C), temperature source Oral, resp. rate 20, height 5' 5.5" (1.664 m), weight 167 lb 12.3 oz (76.1 kg), SpO2 91 %. General appearance: alert, cooperative and no distress Neurologic: Alert and oriented X 3, normal strength and tone. Normal symmetric reflexes. Normal coordination and gait  Disposition: 01-Home or Self Care  Discharge Instructions    Diet - low sodium heart healthy    Complete by:  As directed    Discharge instructions    Complete by:  As directed    Keep sling on until you see the doctor   Increase activity slowly    Complete by:  As directed      Allergies as of 10/23/2016      Reactions   Amitriptyline Hcl Other (See Comments)   Caused "jaws to twist and lock"   Sulfonamide Derivatives Other (See Comments)   UNKNOWN REACTION      Medication List    STOP taking these medications   acetaminophen 500 MG tablet Commonly known as:  TYLENOL   acetaminophen-codeine 300-30 MG tablet Commonly known as:  TYLENOL #3   traMADol 50 MG tablet Commonly known as:  ULTRAM     TAKE these medications   ALPRAZolam 1 MG tablet Commonly known as:  XANAX Take 1 mg by mouth 2 (two) times daily as needed for anxiety or sleep.   amiodarone 100 MG tablet Commonly known as:  PACERONE Take 100 mg by mouth daily.   amLODipine 5 MG tablet Commonly known as:  NORVASC Take 5 mg by mouth at bedtime.   aspirin 81 MG tablet Take 81 mg by mouth  daily.   B-12 PO Take 1 tablet by mouth daily.   clopidogrel 75 MG tablet Commonly known as:  PLAVIX Take 75 mg by mouth daily.   esomeprazole 40 MG capsule Commonly known as:  NEXIUM Take 40 mg by mouth 2 (two) times daily as needed (acid reflux).   FLINTSTONES PLUS IRON chewable tablet Chew 1 tablet by mouth daily.   folic acid 573 MCG tablet Commonly known as:  FOLVITE Take 800 mcg by mouth daily.   furosemide 40 MG tablet Commonly known as:  LASIX Take 20 mg by mouth See admin instructions. Takes 1 tablet daily and a 2nd tablet only if needed for extra fluid retention   HYDROcodone-acetaminophen 5-325 MG tablet Commonly known as:  NORCO/VICODIN Take 1 tablet by mouth every 4 (four) hours as needed for moderate pain. What changed:  how much to take  when to take this  reasons to take this   metoprolol succinate 25 MG 24 hr tablet Commonly known as:  TOPROL XL Take 2 tablets (50 mg total) by mouth 2 (two) times daily. What changed:  additional instructions   NITROSTAT 0.4 MG SL tablet Generic drug:  nitroGLYCERIN Place 0.4 mg under the tongue every 5 (five) minutes as needed for chest pain.   potassium chloride SA 20 MEQ tablet Commonly known as:  K-DUR,KLOR-CON Take 20  mEq by mouth See admin instructions. Takes 1 tablet daily and a 2nd tablet only if has to take a 2nd dose of Lasix   predniSONE 10 MG tablet Commonly known as:  DELTASONE Take 2 tablets (20 mg total) by mouth 2 (two) times daily.   primidone 50 MG tablet Commonly known as:  MYSOLINE Take 50 mg by mouth at bedtime.   thiamine 100 MG tablet Commonly known as:  VITAMIN B-1 Take 100 mg by mouth daily.   valACYclovir 1000 MG tablet Commonly known as:  VALTREX Take 1 tablet (1,000 mg total) by mouth 3 (three) times daily.   valsartan 160 MG tablet Commonly known as:  DIOVAN TAKE (1) TABLET BY MOUTH AT BEDTIME.   vitamin E 400 UNIT capsule Take 400 Units by mouth daily.       Follow-up Information    Arther Abbott, MD. Schedule an appointment as soon as possible for a visit on 10/28/2016.   Specialties:  Orthopedic Surgery, Radiology Contact information: [address] [place] [address] [phone]           Signed: Arther Abbott 10/23/2016, 10:19 AM

## 2016-10-23 NOTE — Care Management Note (Signed)
Case Management Note  Patient Details  Name: [name] MRN: [id number] Date of Birth: 12/11/1935  Subjective/Objective:                  S/P repair of rotator cuff. Pt from home with family, ind with ADL's. Has pcp, transportation and insurance with drug coverage.   Action/Plan: Discharging home today with self care. No need communicated.  Expected Discharge Date:  10/23/16               Expected Discharge Plan:  Home/Self Care  In-House Referral:  NA  Discharge planning Services  CM Consult  Post Acute Care Choice:  NA Choice offered to:  NA Status of Service:  Completed, signed off  Sherald Barge, RN 10/23/2016, 1:52 PM

## 2016-10-24 ENCOUNTER — Encounter (HOSPITAL_COMMUNITY): Payer: Self-pay | Admitting: Orthopedic Surgery

## 2016-10-24 ENCOUNTER — Ambulatory Visit: Payer: Self-pay | Admitting: Orthopedic Surgery

## 2016-10-28 ENCOUNTER — Ambulatory Visit (INDEPENDENT_AMBULATORY_CARE_PROVIDER_SITE_OTHER): Payer: Self-pay | Admitting: Orthopedic Surgery

## 2016-10-28 DIAGNOSIS — Z4889 Encounter for other specified surgical aftercare: Secondary | ICD-10-CM

## 2016-10-28 DIAGNOSIS — Z9889 Other specified postprocedural states: Secondary | ICD-10-CM

## 2016-10-28 MED ORDER — HYDROCODONE-ACETAMINOPHEN 7.5-325 MG PO TABS: 1.0000 | ORAL_TABLET | Freq: Four times a day (QID) | ORAL | 0 refills | Status: DC | PRN

## 2016-10-28 NOTE — Progress Notes (Signed)
Postop visit #1 rotator cuff repair left shoulder on 10/22/2016  She is on Norco 5.0 mg she says she's not getting good pain relief  Her wound is clean dressing was changed. She has a Dermabond closure  She is to continue in her sling follow-up in 2 weeks and then we will start rotator cuff therapy protocol  [place] controlled substance reporting system reviewed Meds ordered this encounter  Medications  . HYDROcodone-acetaminophen (NORCO) 7.5-325 MG tablet    Sig: Take 1 tablet by mouth every 6 (six) hours as needed for moderate pain.    Dispense:  42 tablet    Refill:  0

## 2016-11-07 DIAGNOSIS — I509 Heart failure, unspecified: Secondary | ICD-10-CM | POA: Diagnosis not present

## 2016-11-19 NOTE — Progress Notes (Signed)
Cardiology Office Note  Date: 11/20/2016   ID: MIU CHIONG, DOB 1936-05-04, MRN [id number]  PCP: Wende Neighbors, MD  Primary Cardiologist: Rozann Lesches, MD   Chief Complaint  Patient presents with  . Coronary Artery Disease    History of Present Illness: [name] is a 81 y.o. female last seen in December 2017. She presents for a routine follow-up visit. Reports no change in chronic dyspnea. She has had no angina symptoms or palpitations. Record review finds recent hospitalization with open left rotator cuff repair on March 21. No obvious perioperative cardiac complications. She is still recuperating from this.  She continues to follow in the device clinic with Dr. Lovena Le, Medtronic pacemaker in place.  I reviewed her medications. Cardiac regimen includes aspirin, Norvasc, amiodarone, Plavix, Lasix, Toprol-XL, potassium supplements, Diovan, and as needed nitroglycerin.  I reviewed her most recent ECG and chest x-ray results.  Past Medical History:  Diagnosis Date  . Anemia    history  . Anxiety   . Atrial fibrillation   . Chronic back pain   . COPD (chronic obstructive pulmonary disease) ([place])   . Coronary atherosclerosis of native coronary artery    DES x 2 to RCA 10/10  . 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 Essential hypertension, benign   . GERD (gastroesophageal reflux disease)   . H/O hiatal hernia   . Headache(784.0)   . HOH (hard of hearing)   . Hyperlipidemia   . Neuromuscular disorder ([place])    Tremors  . Pericardial effusion    Hemorrhagic   . Presence of permanent cardiac pacemaker   . Tachycardia-bradycardia syndrome ([place])    s/p Medtronic Adapta L dual chamber device  5/10    Past Surgical History:  Procedure Laterality Date  . APPENDECTOMY    . CARDIAC CATHETERIZATION  2010   stents x2.  Marland Kitchen CATARACT EXTRACTION    . CHOLECYSTECTOMY    . COLONOSCOPY  2011  . COLONOSCOPY N/A 10/19/2014   Procedure: COLONOSCOPY;   Surgeon: Rogene Houston, MD;  [facility];  Service: Endoscopy;  Laterality: N/A;  1030  . ESOPHAGOGASTRODUODENOSCOPY N/A 10/26/2014   Procedure: ESOPHAGOGASTRODUODENOSCOPY (EGD);  Surgeon: Rogene Houston, MD;  Location: [facility];  Service: Endoscopy;  Laterality: N/A;  230 - Dr. has lunch and learn  . Esophagogastroduodenoscopy with esophageal dilation  2004, 2006, 2007  . GIVENS CAPSULE STUDY N/A 10/31/2014   Procedure: GIVENS CAPSULE STUDY;  Surgeon: Rogene Houston, MD;  Location: [facility];  Service: Endoscopy;  Laterality: N/A;  730 -- pacemaker--needs monitoring--outpatient bed  . INSERT / REPLACE / REMOVE PACEMAKER  2010  . LEFT HEART CATHETERIZATION WITH CORONARY ANGIOGRAM N/A 11/17/2011   Procedure: LEFT HEART CATHETERIZATION WITH CORONARY ANGIOGRAM;  Surgeon: Sherren Mocha, MD;  Location: [facility] CATH LAB;  Service: Cardiovascular;  Laterality: N/A;  . LEFT HEART CATHETERIZATION WITH CORONARY ANGIOGRAM N/A 09/26/2014   Procedure: LEFT HEART CATHETERIZATION WITH CORONARY ANGIOGRAM;  Surgeon: Leonie Man, MD;  Location: [facility] CATH LAB;  Service: Cardiovascular;  Laterality: N/A;  . Right rotator cuff repair    . SHOULDER ACROMIOPLASTY Right 05/30/2015   Procedure: RIGHT SHOULDER ACROMIOPLASTY;  Surgeon: Carole Civil, MD;  Location: [facility] ORS;  Service: Orthopedics;  Laterality: Right;  . SHOULDER OPEN ROTATOR CUFF REPAIR Right 05/30/2015   Procedure: OPEN ROTATOR CUFF REPAIR RIGHT SHOULDER;  Surgeon: Carole Civil, MD;  Location: [facility] ORS;  Service: Orthopedics;  Laterality: Right;  . SHOULDER OPEN ROTATOR CUFF REPAIR Left 10/22/2016   Procedure: ROTATOR CUFF REPAIR SHOULDER OPEN;  Surgeon: Carole Civil, MD;  Location: [facility] ORS;  Service: Orthopedics;  Laterality: Left;  . Subxiphoid pericardial window  11/10  . VAGINAL HYSTERECTOMY      Current Outpatient Prescriptions  Medication Sig Dispense Refill  . ALPRAZolam (XANAX) 1 MG tablet Take 1 mg by mouth 2  (two) times daily as needed for anxiety or sleep.     Marland Kitchen amiodarone (PACERONE) 100 MG tablet Take 100 mg by mouth daily.    Marland Kitchen amLODipine (NORVASC) 5 MG tablet Take 5 mg by mouth at bedtime.    Marland Kitchen aspirin 81 MG tablet Take 81 mg by mouth daily.    . clopidogrel (PLAVIX) 75 MG tablet Take 75 mg by mouth daily.     . Cyanocobalamin (B-12 PO) Take 1 tablet by mouth daily.     Marland Kitchen esomeprazole (NEXIUM) 40 MG capsule Take 40 mg by mouth 2 (two) times daily as needed (acid reflux).     . folic acid (FOLVITE) 027 MCG tablet Take 800 mcg by mouth daily.    . furosemide (LASIX) 40 MG tablet Take 20 mg by mouth See admin instructions. Takes 1 tablet daily and a 2nd tablet only if needed for extra fluid retention    . HYDROcodone-acetaminophen (NORCO) 7.5-325 MG tablet Take 1 tablet by mouth every 6 (six) hours as needed for moderate pain. 42 tablet 0  . metoprolol succinate (TOPROL XL) 25 MG 24 hr tablet Take 2 tablets (50 mg total) by mouth 2 (two) times daily. (Patient taking differently: Take 50 mg by mouth 2 (two) times daily. Takes 2 tablets twice daily) 120 tablet 11  . nitroGLYCERIN (NITROSTAT) 0.4 MG SL tablet Place 0.4 mg under the tongue every 5 (five) minutes as needed for chest pain.     . Pediatric Multivitamins-Iron (FLINTSTONES PLUS IRON) chewable tablet Chew 1 tablet by mouth daily.    . potassium chloride SA (K-DUR,KLOR-CON) 20 MEQ tablet Take 20 mEq by mouth See admin instructions. Takes 1 tablet daily and a 2nd tablet only if has to take a 2nd dose of Lasix    . primidone (MYSOLINE) 50 MG tablet Take 50 mg by mouth at bedtime.     . thiamine (VITAMIN B-1) 100 MG tablet Take 100 mg by mouth daily.    . valsartan (DIOVAN) 160 MG tablet TAKE (1) TABLET BY MOUTH AT BEDTIME. 30 tablet 0  . vitamin E 400 UNIT capsule Take 400 Units by mouth daily.     No current facility-administered medications for this visit.    Allergies:  Amitriptyline hcl and Sulfonamide derivatives   Social History: The  patient  reports that she quit smoking about 27 years ago. Her smoking use included Cigarettes. She has a 20.00 pack-year smoking history. She has never used smokeless tobacco. She reports that she does not drink alcohol or use drugs.   ROS:  Please see the history of present illness. Otherwise, complete review of systems is positive for diminished hearing, tremor.  All other systems are reviewed and negative.   Physical Exam: VS:  BP 120/78   Pulse 74   Ht 5\' 5"  (1.651 m)   Wt 164 lb 12.8 oz (74.8 kg)   SpO2 97%   BMI 27.42 kg/m , BMI Body mass index is 27.42 kg/m.  Wt Readings from Last 3 Encounters:  11/20/16 164 lb 12.8 oz (74.8 kg)  10/22/16  167 lb 12.3 oz (76.1 kg)  09/21/16 165 lb (74.8 kg)    General: Elderly woman, no distress. HEENT: Conjunctiva and lids normal, oropharynx clear. Neck: Supple, no elevated JVP or carotid bruits, no thyromegaly. Lungs: Clear to auscultation, nonlabored breathing at rest. Cardiac: Regular rate and rhythm, no S3, soft systolic murmur, no pericardial rub. Abdomen: Soft, nontender, bowel sounds present, no guarding or rebound. Extremities: Trace ankleedema, distal pulses 2+. Skin: Warm and dry. Musculoskeletal: Mildkyphosis. Neuropsychiatric: Alert and oriented x3, affect grossly appropriate.Hard of hearing. Resting tremor.  ECG: I personally reviewed the tracing from 10/17/2016 which showed an atrial paced rhythm.  Recent Labwork: 06/17/2016: ALT 17; AST 21 09/16/2016: TSH 2.400 10/17/2016: BUN 12; Creatinine, Ser 0.67; Hemoglobin 13.8; Platelets 214; Potassium 3.7; Sodium 137     Component Value Date/Time   CHOL 239 (H) 09/16/2016 0714   TRIG 189 (H) 09/16/2016 0714   HDL 41 09/16/2016 0714   CHOLHDL 5.8 09/16/2016 0714   VLDL 38 09/16/2016 0714   LDLCALC 160 (H) 09/16/2016 0714    Other Studies Reviewed Today:  Echocardiogram 12/10/2015: Study Conclusions  - Left ventricle: The cavity size was normal. Wall thickness was    increased in a pattern of moderate LVH. There was focal basal   hypertrophy. Systolic function was normal. The estimated ejection   fraction was in the range of 60% to 65%. Wall motion was normal;   there were no regional wall motion abnormalities. Features are   consistent with a pseudonormal left ventricular filling pattern,   with concomitant abnormal relaxation and increased filling   pressure (grade 2 diastolic dysfunction). Doppler parameters are   consistent with high ventricular filling pressure. - Aortic valve: Moderately calcified annulus. Trileaflet;   moderately thickened leaflets. There was mild regurgitation.   Valve area (VTI): 2.32 cm^2. Valve area (Vmax): 2.3 cm^2.   Regurgitation pressure half-time: 633 ms. - Mitral valve: Moderately to severely calcified annulus. Mildly   thickened leaflets . There was mild regurgitation. - Left atrium: The atrium was severely dilated. - Right ventricle: The cavity size was mildly dilated. - Right atrium: The atrium was severely dilated. - Pulmonary arteries: Systolic pressure was mildly increased. PA   peak pressure: 36 mm Hg (S). - Technically adequate study.  Lexiscan Myoview 08/25/2014: IMPRESSION: 1. Small region of scar with mild peri-infarct ischemia in the inferolateral wall.  2. Normal left ventricular wall motion.  3. Left ventricular ejection fraction 56%  4. Low-risk stress test findings*.  Cardiac catheterization 09/26/2014: FINDINGS: Hemodynamics:  Central Aortic Pressure / Mean: 112/50/74 mmHg  Left Ventricular Pressure / LVEDP: 111/3/12 mmHg  Left Ventriculography:  EF:55-60 %  Wall Motion:Overall normal wall motion with a somewhat globular appearing apex  Coronary Anatomy:  Dominance: Right  Left Main: Normal caliber vessel with moderate calcification. It bifurcates into the LAD And Circumflex. No significant stenoses. ZMO:QHUTML caliber vessel with proximal roughly 30% calcified stenosis.  There is also a focal 40% stenosis at the takeoff of D2. Distally there is 50% stenosis. There are 4 diagonal branches with the first to be moderate caliber second tubing of the small caliber. Minimal disease noted. The distal LAD does not reach all the way to the apex.  Left Circumflex:Normal caliber, nondominant vessel. It gives off a OM1 from the mid vessel. After the OM1 there is a tubular moderate 30-40% stenosis before the vessel bifurcates into a small AV groove branch and OM 2.  OM1:Small-moderate caliber vessel with diffuse 50% stenoses. No significant change  from prior catheterization.  OM 2:Small- moderate caliber vessel that bifurcates distally into 2 small caliber branches. Angiographically normal.   RCA: Large-caliber, dominant vessel with 2 patent stents in the proximal segment. There is diffuse tubular 40% stenosis just prior to the crux. The vessel then normalizes distally as it bifurcates into the Right Posterior AV Groove Branch (RPAV) and the Right Posterior Descending Artery (RPDA).  RPDA: Moderate-large-caliber vessel that is extensive and reaches to the apex and around and to the anteroapex. The vessel is somewhat tortuous but no significant disease.  RPL Sysytem:The RPAV begins as a moderate large-caliber vessel that terminates as one major moderate caliber posterolateral branch with several smaller branches.  Chest x-ray 09/15/2016: FINDINGS: The heart size and mediastinal contours are within normal limits. Both lungs are clear. No pneumothorax or pleural effusion is noted. Left-sided pacemaker is unchanged in position. Atherosclerosis of thoracic aorta is noted. The visualized skeletal structures are unremarkable.  IMPRESSION: No active cardiopulmonary disease.  Assessment and Plan:  1. CAD status post DES to the RCA in 2010. Cardiac catheterization from 2016 showed stable coronary anatomy and we have continued with medical therapy. No active angina  symptoms.  2. Essential hypertension, blood pressure is well controlled today with systolic in the 196Q. No changes were made.  3. Tachycardia-bradycardia syndrome with Medtronic pacemaker in place. She continues to follow with Dr. Lovena Le in the device clinic.  4. Paroxysmal atrial fibrillation, this has been well-controlled overall on low-dose amiodarone. She is not anticoagulated with previous hemorrhagic pericardial effusion.  Current medicines were reviewed with the patient today.  Disposition: Follow-up in 6 months.  Signed, Satira Sark, MD, [facility] 11/20/2016 9:43 AM    [facility] at [facility] [address] [place], [place] [address] Phone: [phone]; Fax: [phone]

## 2016-11-20 ENCOUNTER — Encounter: Payer: Self-pay | Admitting: Cardiology

## 2016-11-20 ENCOUNTER — Ambulatory Visit (INDEPENDENT_AMBULATORY_CARE_PROVIDER_SITE_OTHER): Payer: Medicare Other | Admitting: Cardiology

## 2016-11-20 VITALS — BP 120/78 | HR 74 | Ht 65.0 in | Wt 164.8 lb

## 2016-11-20 DIAGNOSIS — I2510 Atherosclerotic heart disease of native coronary artery without angina pectoris: Secondary | ICD-10-CM | POA: Diagnosis not present

## 2016-11-20 DIAGNOSIS — I10 Essential (primary) hypertension: Secondary | ICD-10-CM | POA: Diagnosis not present

## 2016-11-20 DIAGNOSIS — I480 Paroxysmal atrial fibrillation: Secondary | ICD-10-CM | POA: Diagnosis not present

## 2016-11-20 DIAGNOSIS — I495 Sick sinus syndrome: Secondary | ICD-10-CM

## 2016-11-20 NOTE — Patient Instructions (Signed)
Your physician wants you to follow-up in: 6 months You will receive a reminder letter in the mail two months in advance. If you don't receive a letter, please call our office to schedule the follow-up appointment.    Your physician recommends that you continue on your current medications as directed. Please refer to the Current Medication list given to you today.    If you need a refill on your cardiac medications before your next appointment, please call your pharmacy.     Thank you for choosing [facility] !        

## 2016-11-21 ENCOUNTER — Ambulatory Visit (INDEPENDENT_AMBULATORY_CARE_PROVIDER_SITE_OTHER): Payer: Self-pay | Admitting: Orthopedic Surgery

## 2016-11-21 DIAGNOSIS — Z9889 Other specified postprocedural states: Secondary | ICD-10-CM

## 2016-11-21 DIAGNOSIS — Z4889 Encounter for other specified surgical aftercare: Secondary | ICD-10-CM

## 2016-11-21 MED ORDER — METHOCARBAMOL 500 MG PO TABS: 500.0000 mg | ORAL_TABLET | Freq: Four times a day (QID) | ORAL | 0 refills | Status: DC | PRN

## 2016-11-21 NOTE — Progress Notes (Signed)
Postop visit  Surgery date 10/22/2016 status post left rotator cuff repair she's doing well. Her wound looks clean dry and intact she has no swelling or neurovascular exam is normal  She will start Codman exercises numbers 1, 2 and 3 and we added some Robaxin 500 mg every 6  Her sling use IS  as needed  We will see her in 4 weeks for a postop visit

## 2016-12-05 DIAGNOSIS — R251 Tremor, unspecified: Secondary | ICD-10-CM | POA: Diagnosis not present

## 2016-12-05 DIAGNOSIS — I10 Essential (primary) hypertension: Secondary | ICD-10-CM | POA: Diagnosis not present

## 2016-12-05 DIAGNOSIS — I480 Paroxysmal atrial fibrillation: Secondary | ICD-10-CM | POA: Diagnosis not present

## 2016-12-05 DIAGNOSIS — M25512 Pain in left shoulder: Secondary | ICD-10-CM | POA: Diagnosis not present

## 2016-12-05 DIAGNOSIS — Z0000 Encounter for general adult medical examination without abnormal findings: Secondary | ICD-10-CM | POA: Diagnosis not present

## 2016-12-05 DIAGNOSIS — E782 Mixed hyperlipidemia: Secondary | ICD-10-CM | POA: Diagnosis not present

## 2016-12-07 DIAGNOSIS — I509 Heart failure, unspecified: Secondary | ICD-10-CM | POA: Diagnosis not present

## 2016-12-19 ENCOUNTER — Ambulatory Visit: Payer: Self-pay | Admitting: Orthopedic Surgery

## 2016-12-23 ENCOUNTER — Ambulatory Visit: Payer: Medicare Other | Admitting: Orthopedic Surgery

## 2016-12-24 ENCOUNTER — Encounter (HOSPITAL_COMMUNITY): Payer: Self-pay

## 2016-12-24 ENCOUNTER — Emergency Department (HOSPITAL_COMMUNITY)
Admission: EM | Admit: 2016-12-24 | Discharge: 2016-12-25 | Discharge status: Against Medical Advice | Payer: Medicare Other | Attending: Emergency Medicine | Admitting: Emergency Medicine

## 2016-12-24 DIAGNOSIS — F4321 Adjustment disorder with depressed mood: Secondary | ICD-10-CM | POA: Insufficient documentation

## 2016-12-24 DIAGNOSIS — J449 Chronic obstructive pulmonary disease, unspecified: Secondary | ICD-10-CM | POA: Diagnosis not present

## 2016-12-24 DIAGNOSIS — Z7982 Long term (current) use of aspirin: Secondary | ICD-10-CM | POA: Insufficient documentation

## 2016-12-24 DIAGNOSIS — I10 Essential (primary) hypertension: Secondary | ICD-10-CM | POA: Diagnosis not present

## 2016-12-24 DIAGNOSIS — Z79899 Other long term (current) drug therapy: Secondary | ICD-10-CM | POA: Diagnosis not present

## 2016-12-24 DIAGNOSIS — F419 Anxiety disorder, unspecified: Secondary | ICD-10-CM | POA: Diagnosis present

## 2016-12-24 DIAGNOSIS — I2510 Atherosclerotic heart disease of native coronary artery without angina pectoris: Secondary | ICD-10-CM | POA: Diagnosis not present

## 2016-12-24 DIAGNOSIS — Z87891 Personal history of nicotine dependence: Secondary | ICD-10-CM | POA: Insufficient documentation
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
## 2016-12-25 NOTE — ED Notes (Signed)
Pt states she does not want to stay for any further treatment and states she is going to stay with her brother for tonight.  Pt is ambulatory without diff, states she is driving herself and feels comfortable with same.   Pt signed out AMA and was told to return for any further problems or concerns.

## 2016-12-26 ENCOUNTER — Other Ambulatory Visit: Payer: Self-pay | Admitting: Critical Care Medicine

## 2017-01-02 ENCOUNTER — Ambulatory Visit (INDEPENDENT_AMBULATORY_CARE_PROVIDER_SITE_OTHER): Payer: Self-pay | Admitting: Orthopedic Surgery

## 2017-01-02 DIAGNOSIS — Z4889 Encounter for other specified surgical aftercare: Secondary | ICD-10-CM

## 2017-01-02 DIAGNOSIS — Z9889 Other specified postprocedural states: Secondary | ICD-10-CM

## 2017-01-02 MED ORDER — HYDROCODONE-ACETAMINOPHEN 5-325 MG PO TABS: 1.0000 | ORAL_TABLET | Freq: Four times a day (QID) | ORAL | 0 refills | Status: DC | PRN

## 2017-01-02 NOTE — Progress Notes (Signed)
This is a follow-up visit after rotator cuff repair the left shoulder  The patient continues to progress very well  Status post rotator cuff repair left shoulder March 20 or 21st 2018  She does her own physical therapy she is doing well she can flex her arm 130. She has mild discomfort. She will continue therapy I refilled her pain medication and will see her in 3 MOS  Meds ordered this encounter  Medications  . HYDROcodone-acetaminophen (NORCO) 5-325 MG tablet    Sig: Take 1 tablet by mouth every 6 (six) hours as needed for moderate pain.    Dispense:  30 tablet    Refill:  0

## 2017-01-02 NOTE — Patient Instructions (Addendum)
What You Need to Know About Prescription Opioid Pain Medicine Opioids are powerful medicines that are used to treat moderate to severe pain. Opioids should be taken with the supervision of a trained health care provider. They should be taken for the shortest period of time as possible. This is because opioids can be addictive and the longer you take opioids, the greater your risk of addiction (opioid use disorder). What do opioids do? Opioids help reduce or eliminate pain. When used for short periods of time, they can help you:  Sleep better.  Do better in physical or occupational therapy.  Feel better in the first few days after an injury.  Recover from surgery.  What kind of problems can opioids cause? Opioids can cause side effects, such as:  Constipation.  Nausea.  Vomiting.  Drowsiness.  Confusion.  Opioid use disorder.  Breathing difficulties (respiratory depression).  Using opioid pain medicines for longer than 3 days increases your risk of these side effects. Taking opioid pain medicine for a long period of time can affect your ability to do daily tasks. It also puts you at risk for:  Car accidents.  Heart attack.  Overdose, which can sometimes lead to death.  What can increase my risk for developing problems while taking opioids? You may be at an especially high risk for problems while taking opioids if you:  Are over the age of 65.  Are pregnant.  Have kidney or liver disease.  Have certain mental health conditions, such as depression or anxiety.  Have a history of substance use disorder.  Have had an opioid overdose in the past.  How do I stop taking opioids if I have been taking them for a long time? If you have been taking opioid medicine for more than a few weeks, you may need to slowly stop taking them (taper). Tapering your use of opioids can decrease your chances of experiencing withdrawal symptoms, such as:  Abdominal pain and  cramping.  Nausea.  Sweating.  Sleepiness.  Restlessness.  Uncontrollable shaking (tremors).  Cravings for the medicine.  Do not attempt to taper your use of opioids on your own. Talk with your health care provider about how to do this. Your health care provider may prescribe a step-down schedule based on how much medicine you are taking and how long you have been taking it. What are the benefits of stopping the use of opioids? By switching from opioid pain medicine to non-opioid pain management options, you will decrease your risk of accidents and injuries associated with long-term opioid use. You will also be able to:  Monitor your pain more accurately and know when to seek medical care if it is not improving.  Decrease risk to others around you. Having opioids in the home increases the risk for accidental or intentional use or overdose by others.  How can I treat pain without opioids? Pain can be managed with many types of alternative treatments. Ask your health care provider to refer you to one or more specialists who can help you manage pain through:  Physical or occupational therapy.  Counseling (cognitive-behavioral therapy).  Good nutrition.  Biofeedback.  Massage.  Meditation.  Non-opioid medicine.  Following a gentle exercise program.  Where can I get support? If you have been taking opioids for a long time, you may benefit from receiving support for quitting from a local support group or counselor. Ask your health care provider for a referral to these resources in your area. When should I   seek medical care? Seek medical care right away if you are taking opioids and you experience any of the following:  Difficulty breathing.  Breathing that is more shallow or slower than normal.  A very slow heartbeat (pulse).  Severe confusion.  Unconsciousness.  Sleepiness.  Difficulty waking from sleep.  Slurred speech.  Nausea and vomiting.  Cold, clammy  skin.  Blue lips or fingernails.  Limpness.  Abnormally small pupils.  If you think that you or someone else may have taken too much of an opioid medicine, get medical help right away. Do not wait to see if the symptoms go away on their own. Call your local emergency services (911 in the [place] or call the hotline of the National Poison Control Center (800-222-1222 in the [place] Where can I get more information? To learn more about opioid medicines, visit the Centers for Disease Control and Prevention web site Opioid Basics at https://www.cdc.gov/drugoverdos/opioids/index.html. Summary  Opioid medicines can help you manage moderate-to-severe pain for a short period of time.  Taking opioid pain medicine for a long period of time puts you at risk for unintentional accidents, injury, and even death.  If you think that you or someone else may have taken too much of an opioid, get medical help right away. This information is not intended to replace advice given to you by your health care provider. Make sure you discuss any questions you have with your health care provider. Document Released: 08/17/2015 Document Revised: 03/14/2016 Document Reviewed: 03/02/2015 Elsevier Interactive Patient Education  2017 [employer]  

## 2017-01-06 ENCOUNTER — Telehealth: Payer: Self-pay | Admitting: Orthopedic Surgery

## 2017-01-06 NOTE — Telephone Encounter (Signed)
Patient called stating she wanted "a shot in her back."  I told her that since she was seeing Dr. Aline Brochure for her shoulder right now, she would need to see her family doctor to evaluate her back/hip.  She said she would

## 2017-01-07 DIAGNOSIS — I509 Heart failure, unspecified: Secondary | ICD-10-CM | POA: Diagnosis not present

## 2017-01-15 DIAGNOSIS — G4700 Insomnia, unspecified: Secondary | ICD-10-CM | POA: Diagnosis not present

## 2017-01-15 DIAGNOSIS — M549 Dorsalgia, unspecified: Secondary | ICD-10-CM | POA: Diagnosis not present

## 2017-02-05 ENCOUNTER — Ambulatory Visit (INDEPENDENT_AMBULATORY_CARE_PROVIDER_SITE_OTHER): Payer: Medicare Other | Admitting: Internal Medicine

## 2017-02-05 ENCOUNTER — Encounter: Payer: Self-pay | Admitting: Internal Medicine

## 2017-02-05 DIAGNOSIS — R21 Rash and other nonspecific skin eruption: Secondary | ICD-10-CM | POA: Diagnosis not present

## 2017-02-05 DIAGNOSIS — I495 Sick sinus syndrome: Secondary | ICD-10-CM | POA: Diagnosis not present

## 2017-02-05 LAB — CUP PACEART INCLINIC DEVICE CHECK
Battery Remaining Longevity: 66 mo
Brady Statistic AP VP Percent: 0.1 %
Brady Statistic AP VS Percent: 87.8 %
Brady Statistic AS VP Percent: 0.1 % — CL
Date Time Interrogation Session: 20180705130223
Implantable Lead Implant Date: 20100526
Implantable Lead Location: 753860
Implantable Lead Model: 5076
Implantable Pulse Generator Implant Date: 20100526
Lead Channel Impedance Value: 605 Ohm
Lead Channel Pacing Threshold Amplitude: 0.5 V
Lead Channel Pacing Threshold Amplitude: 0.75 V
Lead Channel Pacing Threshold Pulse Width: 0.4 ms
Lead Channel Sensing Intrinsic Amplitude: 2 mV
Lead Channel Sensing Intrinsic Amplitude: 8 mV
MDC IDC LEAD IMPLANT DT: 20100526
MDC IDC LEAD LOCATION: 753859
MDC IDC MSMT BATTERY IMPEDANCE: 842 Ohm
MDC IDC MSMT BATTERY VOLTAGE: 2.78 V
MDC IDC MSMT LEADCHNL RA IMPEDANCE VALUE: 436 Ohm
MDC IDC MSMT LEADCHNL RV PACING THRESHOLD PULSEWIDTH: 0.4 ms
MDC IDC SET LEADCHNL RA PACING AMPLITUDE: 2 V
MDC IDC SET LEADCHNL RV PACING AMPLITUDE: 2.5 V
MDC IDC SET LEADCHNL RV PACING PULSEWIDTH: 0.4 ms
MDC IDC SET LEADCHNL RV SENSING SENSITIVITY: 5.6 mV
MDC IDC STAT BRADY AS VS PERCENT: 12.1 %

## 2017-02-05 NOTE — Patient Instructions (Signed)
Your physician wants you to follow-up in: 1 Year with Dr. Lovena Le.  You will receive a reminder letter in the mail two months in advance. If you don't receive a letter, please call our office to schedule the follow-up appointment.  Your physician recommends that you continue on your current medications as directed. Please refer to the Current Medication list given to you today.  Your physician recommends that you schedule a follow-up appointment in: 6 Months with the device clinic.   If you need a refill on your cardiac medications before your next appointment, please call your pharmacy.  Thank you for choosing [facility]!

## 2017-02-05 NOTE — Progress Notes (Signed)
HPI [name] returns today for followup. She is a 81 year old woman with a history of tachybrady syndrome, coronary disease, status post permanent pacemaker insertion. The patient is not thought to be a Coumadin candidate, particularly in light of her need for aspirin and Plavix. In the interim, she has lost her daughter who died of emphysema. She has not fallen. Her tremor has worsened. Allergies  Allergen Reactions  . Amitriptyline Hcl Other (See Comments)    Caused "jaws to twist and lock"  . Sulfonamide Derivatives Other (See Comments)    UNKNOWN REACTION     Current Outpatient Prescriptions  Medication Sig Dispense Refill  . ALPRAZolam (XANAX) 1 MG tablet Take 1 mg by mouth 2 (two) times daily as needed for anxiety or sleep.     Marland Kitchen amiodarone (PACERONE) 100 MG tablet Take 100 mg by mouth daily.    Marland Kitchen amLODipine (NORVASC) 5 MG tablet Take 5 mg by mouth at bedtime.    Marland Kitchen aspirin 81 MG tablet Take 81 mg by mouth daily.    . clopidogrel (PLAVIX) 75 MG tablet Take 75 mg by mouth daily.     . Cyanocobalamin (B-12 PO) Take 1 tablet by mouth daily.     Marland Kitchen esomeprazole (NEXIUM) 40 MG capsule Take 40 mg by mouth 2 (two) times daily as needed (acid reflux).     . folic acid (FOLVITE) 284 MCG tablet Take 800 mcg by mouth daily.    . furosemide (LASIX) 40 MG tablet Take 20 mg by mouth See admin instructions. Takes 1 tablet daily and a 2nd tablet only if needed for extra fluid retention    . metoprolol succinate (TOPROL XL) 25 MG 24 hr tablet Take 2 tablets (50 mg total) by mouth 2 (two) times daily. (Patient taking differently: Take 50 mg by mouth 2 (two) times daily. Takes 2 tablets twice daily) 120 tablet 11  . nitroGLYCERIN (NITROSTAT) 0.4 MG SL tablet Place 0.4 mg under the tongue every 5 (five) minutes as needed for chest pain.     . Pediatric Multivitamins-Iron (FLINTSTONES PLUS IRON) chewable tablet Chew 1 tablet by mouth daily.    . potassium chloride SA (K-DUR,KLOR-CON) 20 MEQ tablet Take 20  mEq by mouth See admin instructions. Takes 1 tablet daily and a 2nd tablet only if has to take a 2nd dose of Lasix    . primidone (MYSOLINE) 50 MG tablet Take 50 mg by mouth at bedtime.     . thiamine (VITAMIN B-1) 100 MG tablet Take 100 mg by mouth daily.    . valsartan (DIOVAN) 160 MG tablet TAKE (1) TABLET BY MOUTH AT BEDTIME. 30 tablet 0  . vitamin E 400 UNIT capsule Take 400 Units by mouth daily.     No current facility-administered medications for this visit.      Past Medical History:  Diagnosis Date  . Anemia    history  . Anxiety   . Atrial fibrillation   . Chronic back pain   . COPD (chronic obstructive pulmonary disease) ([place])   . Coronary atherosclerosis of native coronary artery    DES x 2 to RCA 10/10  . 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 Essential hypertension, benign   . GERD (gastroesophageal reflux disease)   . H/O hiatal hernia   . Headache(784.0)   . HOH (hard of hearing)   . Hyperlipidemia   . Neuromuscular disorder ([place])    Tremors  . Pericardial effusion    Hemorrhagic   .  Presence of permanent cardiac pacemaker   . Tachycardia-bradycardia syndrome ([place])    s/p Medtronic Adapta L dual chamber device  5/10    ROS:   All systems reviewed and negative except as noted in the HPI.   Past Surgical History:  Procedure Laterality Date  . APPENDECTOMY    . CARDIAC CATHETERIZATION  2010   stents x2.  Marland Kitchen CATARACT EXTRACTION    . CHOLECYSTECTOMY    . COLONOSCOPY  2011  . COLONOSCOPY N/A 10/19/2014   Procedure: COLONOSCOPY;  Surgeon: Rogene Houston, MD;  [facility];  Service: Endoscopy;  Laterality: N/A;  1030  . ESOPHAGOGASTRODUODENOSCOPY N/A 10/26/2014   Procedure: ESOPHAGOGASTRODUODENOSCOPY (EGD);  Surgeon: Rogene Houston, MD;  [facility];  Service: Endoscopy;  Laterality: N/A;  230 - Dr. has lunch and learn  . Esophagogastroduodenoscopy with esophageal dilation  2004, 2006, 2007  . GIVENS CAPSULE  STUDY N/A 10/31/2014   Procedure: GIVENS CAPSULE STUDY;  Surgeon: Rogene Houston, MD;  [facility]: [facility];  Service: Endoscopy;  Laterality: N/A;  730 -- pacemaker--needs monitoring--outpatient bed  . INSERT / REPLACE / REMOVE PACEMAKER  2010  . LEFT HEART CATHETERIZATION WITH CORONARY ANGIOGRAM N/A 11/17/2011   Procedure: LEFT HEART CATHETERIZATION WITH CORONARY ANGIOGRAM;  Surgeon: Sherren Mocha, MD;  Location: [facility] CATH LAB;  Service: Cardiovascular;  Laterality: N/A;  . LEFT HEART CATHETERIZATION WITH CORONARY ANGIOGRAM N/A 09/26/2014   Procedure: LEFT HEART CATHETERIZATION WITH CORONARY ANGIOGRAM;  Surgeon: Leonie Man, MD;  Location: [facility] CATH LAB;  Service: Cardiovascular;  Laterality: N/A;  . Right rotator cuff repair    . SHOULDER ACROMIOPLASTY Right 05/30/2015   Procedure: RIGHT SHOULDER ACROMIOPLASTY;  Surgeon: Carole Civil, MD;  Location: [facility] OR[facility];  Service: Orthopedics;  Laterality: Right;  . SHOULDER OPEN ROTATOR CUFF REPAIR Right 05/30/2015   Procedure: OPEN ROTATOR CUFF REPAIR RIGHT SHOULDER;  Surgeon: Carole Civil, MD;  Location: [facility];  Service: Orthopedics;  Laterality: Right;  . SHOULDER OPEN ROTATOR CUFF REPAIR Left 10/22/2016   Procedure: ROTATOR CUFF REPAIR SHOULDER OPEN;  Surgeon: Carole Civil, MD;  Location: [facility];  Service: Orthopedics;  Laterality: Left;  . Subxiphoid pericardial window  11/10  . VAGINAL HYSTERECTOMY       Family History  Problem Relation Age of Onset  . Cancer Mother        Colon   . Coronary artery disease Sister   . Coronary artery disease Brother   . Arthritis Unknown   . Lung disease Unknown   . Asthma Unknown      Social History   Social History  . Marital status: Widowed    Spouse name: N/A  . Number of children: N/A  . Years of education: N/A   Occupational History  . Retired Retired   Social History Main Topics  . Smoking status: Former Smoker    Packs/day: 2.00    Years: 10.00    Types: Cigarettes     Quit date: 05/23/1989  . Smokeless tobacco: Never Used  . Alcohol use No  . Drug use: No  . Sexual activity: No   Other Topics Concern  . Not on file   Social History Narrative   She lives alone, has adopted daughter.     BP 128/80   Pulse 89   Ht 5\' 5"  (1.651 m)   Wt 161 lb (73 kg)   SpO2 94%   BMI 26.79 kg/m   Physical Exam:  Well  appearing 81 year old woman, NAD with a resting tremor HEENT: Unremarkable Neck:  6 cm JVD, no thyromegally Lungs:  Clear with no wheezes, rales, or rhonchi. Well-healed pacemaker incision. HEART:  Regular rate rhythm, no murmurs, no rubs, no clicks Abd:  soft, positive bowel sounds, no organomegally, no rebound, no guarding Ext:  2 plus pulses, no edema, no cyanosis, no clubbing Skin:  No rashes no nodules Neuro:  CN II through XII intact, motor grossly intact, resting tremor is present.  DEVICE  Normal device function.  See PaceArt for details.   Assess/Plan:  1. Dyspnea - this is back to baseline. Will follow.  2. HTN - her blood pressure is elevated. She is encouraged to reduce her salt intake. I would consider changing from metoprolol to coreg 3. PPM - her medtronic DDD PM is working normally. Will recheck in several months. 4. CAD - she is s/p stenting remotely. She will continue her current meds. No angina.  Mikle Bosworth.D.

## 2017-02-06 DIAGNOSIS — H26493 Other secondary cataract, bilateral: Secondary | ICD-10-CM | POA: Diagnosis not present

## 2017-02-06 DIAGNOSIS — Z961 Presence of intraocular lens: Secondary | ICD-10-CM | POA: Diagnosis not present

## 2017-02-06 DIAGNOSIS — H524 Presbyopia: Secondary | ICD-10-CM | POA: Diagnosis not present

## 2017-02-06 DIAGNOSIS — I509 Heart failure, unspecified: Secondary | ICD-10-CM | POA: Diagnosis not present

## 2017-02-19 ENCOUNTER — Telehealth: Payer: Self-pay

## 2017-02-19 NOTE — Telephone Encounter (Signed)
Pt on Diovan 160 mg qd.recalled by FDA. [place] recommends pt switch to Irbesartan 150 mg. Do you concur with pharmacist ?  Pt 's pharmacy is [facility], the pharmacist, has confirmed their drugs were recalled there.

## 2017-02-20 MED ORDER — IRBESARTAN 75 MG PO TABS: 75.0000 mg | ORAL_TABLET | Freq: Every day | ORAL | 3 refills | Status: DC

## 2017-02-20 MED ORDER — IRBESARTAN 150 MG PO TABS: 150.0000 mg | ORAL_TABLET | Freq: Every day | ORAL | 3 refills | Status: DC

## 2017-02-20 NOTE — Addendum Note (Signed)
Addended by: Barbarann Ehlers A on: 02/20/2017 08:18 AM   Modules accepted: Orders

## 2017-02-20 NOTE — Telephone Encounter (Addendum)
Diovan recalled by FDA, Avapro 150 mg changed in place, ok per Dr Domenic Polite, pt notified e-scribed to Brenda

## 2017-02-20 NOTE — Telephone Encounter (Signed)
That switch is OK. Thank you for verifying with pharmacy.

## 2017-02-24 ENCOUNTER — Ambulatory Visit (INDEPENDENT_AMBULATORY_CARE_PROVIDER_SITE_OTHER): Payer: Medicare Other | Admitting: Internal Medicine

## 2017-03-09 DIAGNOSIS — I509 Heart failure, unspecified: Secondary | ICD-10-CM | POA: Diagnosis not present

## 2017-03-10 ENCOUNTER — Ambulatory Visit (INDEPENDENT_AMBULATORY_CARE_PROVIDER_SITE_OTHER): Payer: Medicare Other | Admitting: Internal Medicine

## 2017-03-10 IMAGING — CR DG CHEST 1V PORT
1 series · 1 of 1 positions shown · non-contrast
Comparison: Prior radiograph from 08/23/2014

CLINICAL DATA: Initial evaluation for acute chest pain.

EXAM:
PORTABLE CHEST - 1 VIEW

[ap portable]
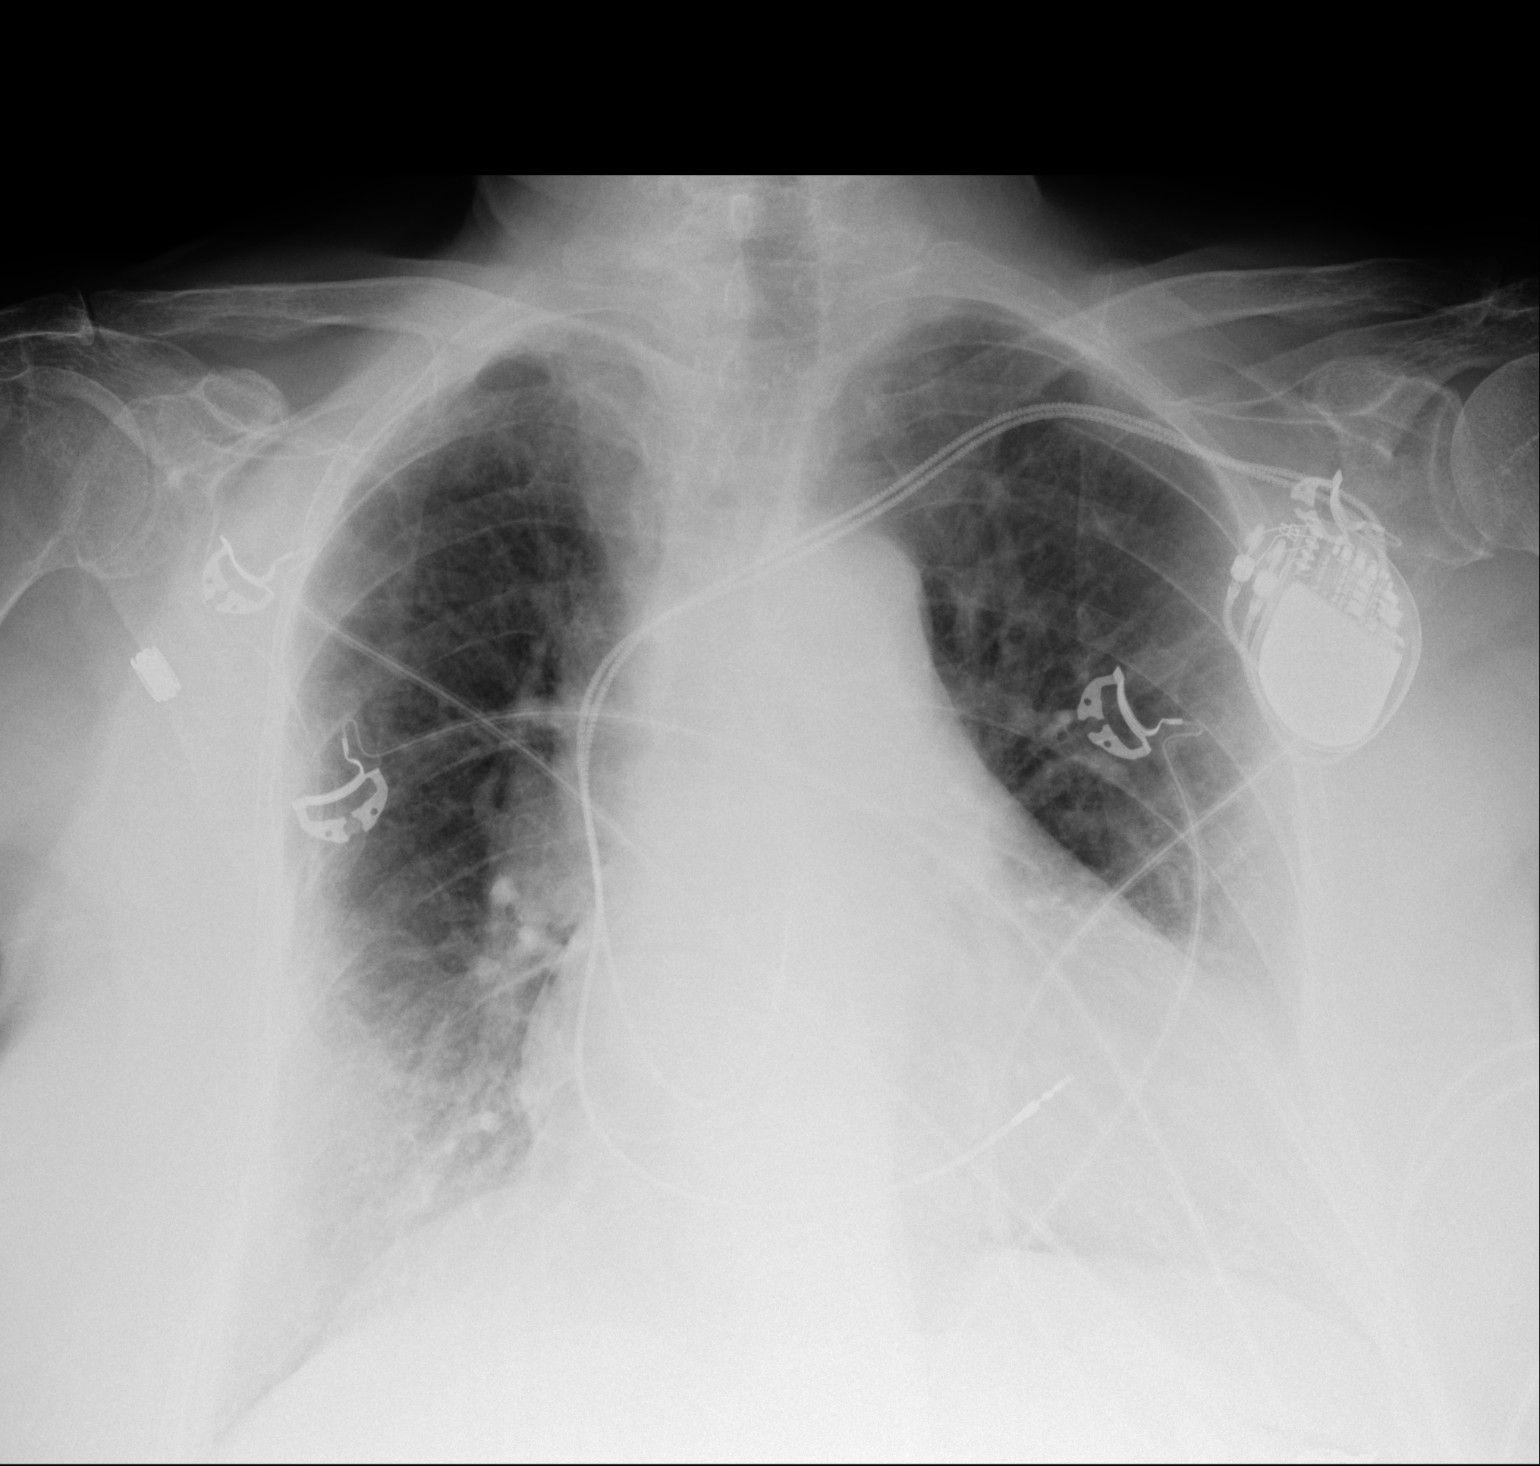

[1 of 1 positions shown; findings below may reference images not displayed]

FINDINGS: Cardiomegaly is stable from previous. Left-sided transvenous
pacemaker/ AICD with electrodes overlying the right atrium right
ventricle again noted, unchanged. Mediastinal silhouette is stable.
Right were bowing of the tracheal air column at the aortic knob
noted.

Lungs are normally inflated. No focal infiltrate identified. Mild
perihilar vascular congestion without overt edema. No pleural
effusion. No pneumothorax.

No acute osseous abnormality.
IMPRESSION: 1. Stable cardiomegaly with mild central perihilar vascular
congestion without overt pulmonary edema.
2. No other active cardiopulmonary disease.

## 2017-03-16 ENCOUNTER — Ambulatory Visit (INDEPENDENT_AMBULATORY_CARE_PROVIDER_SITE_OTHER): Payer: Medicare Other | Admitting: Internal Medicine

## 2017-03-24 ENCOUNTER — Ambulatory Visit (INDEPENDENT_AMBULATORY_CARE_PROVIDER_SITE_OTHER): Payer: Medicare Other | Admitting: Internal Medicine

## 2017-03-24 ENCOUNTER — Encounter (INDEPENDENT_AMBULATORY_CARE_PROVIDER_SITE_OTHER): Payer: Self-pay | Admitting: Internal Medicine

## 2017-03-24 VITALS — BP 160/80 | HR 64 | Temp 98.3°F | Ht 65.5 in | Wt 164.5 lb

## 2017-03-24 DIAGNOSIS — R110 Nausea: Secondary | ICD-10-CM | POA: Diagnosis not present

## 2017-03-24 DIAGNOSIS — D508 Other iron deficiency anemias: Secondary | ICD-10-CM | POA: Diagnosis not present

## 2017-03-24 LAB — CBC WITH DIFFERENTIAL/PLATELET
BASOS PCT: 0 %
Basophils Absolute: 0 cells/uL (ref 0–200)
EOS ABS: 57 {cells}/uL (ref 15–500)
Eosinophils Relative: 1 %
HCT: 40.5 % (ref 35.0–45.0)
HEMOGLOBIN: 13.3 g/dL (ref 11.7–15.5)
LYMPHS ABS: 1938 {cells}/uL (ref 850–3900)
Lymphocytes Relative: 34 %
MCH: 30.1 pg (ref 27.0–33.0)
MCHC: 32.8 g/dL (ref 32.0–36.0)
MCV: 91.6 fL (ref 80.0–100.0)
MPV: 9.9 fL (ref 7.5–12.5)
Monocytes Absolute: 570 cells/uL (ref 200–950)
Monocytes Relative: 10 %
Neutro Abs: 3135 cells/uL (ref 1500–7800)
Neutrophils Relative %: 55 %
Platelets: 230 10*3/uL (ref 140–400)
RBC: 4.42 MIL/uL (ref 3.80–5.10)
RDW: 14.7 % (ref 11.0–15.0)
WBC: 5.7 10*3/uL (ref 3.8–10.8)

## 2017-03-24 NOTE — Progress Notes (Signed)
Subjective:    Patient ID: [name], female    DOB: 1935-12-25, 81 y.o.   MRN: [id number]  HPI Presents today with c/o nausea/ abdominal pain. Last seen in December of 2017.  Hx of GI bleed and IDA. At last OV she c/o nausea daily that was worse in the am.   evaluated in 2016  for GI bleed and iron deficiency anemia. She underwent EGD colonoscopy and given capsule study in March 2016. She was felt to be bleeding from small bowel angiodysplasia.  She tells me she has abdominal bloating. She tells me she is doing fairly well. She has chronic nausea. She says she doesn't eat much thought she has not lost any weight. She does eat a snack in between meals.  She has children that check on her. She has a BM daily. She says she has diarrhea in the morning.  Weight in December of 2017 was 165 She says she feel better today.   Hx of atrial fib and maintained on Plavix.   CBC    Component Value Date/Time   WBC 5.4 10/17/2016 1018   RBC 4.62 10/17/2016 1018   HGB 13.8 10/17/2016 1018   HCT 42.9 10/17/2016 1018   PLT 214 10/17/2016 1018   MCV 92.9 10/17/2016 1018   MCH 29.9 10/17/2016 1018   MCHC 32.2 10/17/2016 1018   RDW 14.5 10/17/2016 1018   LYMPHSABS 1.7 10/17/2016 1018   MONOABS 0.7 10/17/2016 1018   EOSABS 0.1 10/17/2016 1018   BASOSABS 0.0 10/17/2016 1018     Review of Systems Past Medical History:  Diagnosis Date  . Anemia    history  . Anxiety   . Atrial fibrillation   . Chronic back pain   . COPD (chronic obstructive pulmonary disease) ([place])   . Coronary atherosclerosis of native coronary artery    DES x 2 to RCA 10/10  . 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 Essential hypertension, benign   . GERD (gastroesophageal reflux disease)   . H/O hiatal hernia   . Headache(784.0)   . HOH (hard of hearing)   . Hyperlipidemia   . Neuromuscular disorder ([place])    Tremors  . Pericardial effusion    Hemorrhagic   . Presence of permanent cardiac  pacemaker   . Tachycardia-bradycardia syndrome ([place])    s/p Medtronic Adapta L dual chamber device  5/10    Past Surgical History:  Procedure Laterality Date  . APPENDECTOMY    . CARDIAC CATHETERIZATION  2010   stents x2.  Marland Kitchen CATARACT EXTRACTION    . CHOLECYSTECTOMY    . COLONOSCOPY  2011  . COLONOSCOPY N/A 10/19/2014   Procedure: COLONOSCOPY;  Surgeon: Rogene Houston, MD;  [facility];  Service: Endoscopy;  Laterality: N/A;  1030  . ESOPHAGOGASTRODUODENOSCOPY N/A 10/26/2014   Procedure: ESOPHAGOGASTRODUODENOSCOPY (EGD);  Surgeon: Rogene Houston, MD;  [facility];  Service: Endoscopy;  Laterality: N/A;  230 - Dr. has lunch and learn  . Esophagogastroduodenoscopy with esophageal dilation  2004, 2006, 2007  . GIVENS CAPSULE STUDY N/A 10/31/2014   Procedure: GIVENS CAPSULE STUDY;  Surgeon: Rogene Houston, MD;  [facility];  Service: Endoscopy;  Laterality: N/A;  730 -- pacemaker--needs monitoring--outpatient bed  . INSERT / REPLACE / REMOVE PACEMAKER  2010  . LEFT HEART CATHETERIZATION WITH CORONARY ANGIOGRAM N/A 11/17/2011   Procedure: LEFT HEART CATHETERIZATION WITH CORONARY ANGIOGRAM;  Surgeon: Sherren Mocha, MD;  Location: [facility] CATH LAB;  Service: Cardiovascular;  Laterality: N/A;  . LEFT HEART CATHETERIZATION WITH CORONARY ANGIOGRAM N/A 09/26/2014   Procedure: LEFT HEART CATHETERIZATION WITH CORONARY ANGIOGRAM;  Surgeon: Leonie Man, MD;  Location: [facility] CATH LAB;  Service: Cardiovascular;  Laterality: N/A;  . Right rotator cuff repair    . SHOULDER ACROMIOPLASTY Right 05/30/2015   Procedure: RIGHT SHOULDER ACROMIOPLASTY;  Surgeon: Carole Civil, MD;  Location: [facility];  Service: Orthopedics;  Laterality: Right;  . SHOULDER OPEN ROTATOR CUFF REPAIR Right 05/30/2015   Procedure: OPEN ROTATOR CUFF REPAIR RIGHT SHOULDER;  Surgeon: Carole Civil, MD;  Location: [facility];  Service: Orthopedics;  Laterality: Right;  . SHOULDER OPEN ROTATOR CUFF REPAIR  Left 10/22/2016   Procedure: ROTATOR CUFF REPAIR SHOULDER OPEN;  Surgeon: Carole Civil, MD;  Location: [facility];  Service: Orthopedics;  Laterality: Left;  . Subxiphoid pericardial window  11/10  . VAGINAL HYSTERECTOMY      Allergies  Allergen Reactions  . Amitriptyline Hcl Other (See Comments)    Caused "jaws to twist and lock"  . Sulfonamide Derivatives Other (See Comments)    UNKNOWN REACTION    Current Outpatient Prescriptions on File Prior to Visit  Medication Sig Dispense Refill  . ALPRAZolam (XANAX) 1 MG tablet Take 1 mg by mouth 2 (two) times daily as needed for anxiety or sleep.     Marland Kitchen amiodarone (PACERONE) 100 MG tablet Take 100 mg by mouth daily.    Marland Kitchen amLODipine (NORVASC) 5 MG tablet Take 5 mg by mouth at bedtime.    Marland Kitchen aspirin 81 MG tablet Take 81 mg by mouth daily.    . clopidogrel (PLAVIX) 75 MG tablet Take 75 mg by mouth daily.     . Cyanocobalamin (B-12 PO) Take 1 tablet by mouth daily.     Marland Kitchen esomeprazole (NEXIUM) 40 MG capsule Take 40 mg by mouth 2 (two) times daily as needed (acid reflux).     . folic acid (FOLVITE) 283 MCG tablet Take 800 mcg by mouth daily.    . furosemide (LASIX) 40 MG tablet Take 20 mg by mouth See admin instructions. Takes 1 tablet daily and a 2nd tablet only if needed for extra fluid retention    . irbesartan (AVAPRO) 150 MG tablet Take 1 tablet (150 mg total) by mouth daily. 90 tablet 3  . metoprolol succinate (TOPROL XL) 25 MG 24 hr tablet Take 2 tablets (50 mg total) by mouth 2 (two) times daily. (Patient taking differently: Take 50 mg by mouth 2 (two) times daily. Takes 2 tablets twice daily) 120 tablet 11  . nitroGLYCERIN (NITROSTAT) 0.4 MG SL tablet Place 0.4 mg under the tongue every 5 (five) minutes as needed for chest pain.     . Pediatric Multivitamins-Iron (FLINTSTONES PLUS IRON) chewable tablet Chew 1 tablet by mouth daily.    . potassium chloride SA (K-DUR,KLOR-CON) 20 MEQ tablet Take 20 mEq by mouth See admin instructions. Takes 1  tablet daily and a 2nd tablet only if has to take a 2nd dose of Lasix    . primidone (MYSOLINE) 50 MG tablet Take 50 mg by mouth at bedtime.     . thiamine (VITAMIN B-1) 100 MG tablet Take 100 mg by mouth daily.    . vitamin E 400 UNIT capsule Take 400 Units by mouth daily.     No current facility-administered medications on file prior to visit.         Objective:   Physical Exam  Blood pressure (!) 160/80, pulse 64, temperature 98.3 F (36.8 C), height 5' 5.5" (1.664 m), weight 164 lb 8 oz (74.6 kg). Alert and oriented. Skin warm and dry. Oral mucosa is moist.   . Sclera anicteric, conjunctivae is pink. Thyroid not enlarged. No cervical lymphadenopathy. Lungs clear. Heart regular rate and rhythm.  Abdomen is soft. Bowel sounds are positive. No hepatomegaly. No abdominal masses felt. No tenderness.  No edema to lower extremities.         Assessment & Plan:  Chronic nausea.  There has been no weight loss. Try eating small meals.  Will get a Hepatic function. IDA: CBC in March normal.   Will get a CBC  OV in 1 year.

## 2017-03-24 NOTE — Patient Instructions (Signed)
Lab. OV in 1 year

## 2017-03-25 LAB — HEPATIC FUNCTION PANEL
ALK PHOS: 84 U/L (ref 33–130)
ALT: 15 U/L (ref 6–29)
AST: 19 U/L (ref 10–35)
Albumin: 4 g/dL (ref 3.6–5.1)
BILIRUBIN DIRECT: 0.1 mg/dL (ref <=0.2)
BILIRUBIN INDIRECT: 0.4 mg/dL (ref 0.2–1.2)
TOTAL PROTEIN: 7.2 g/dL (ref 6.1–8.1)
Total Bilirubin: 0.5 mg/dL (ref 0.2–1.2)

## 2017-04-09 DIAGNOSIS — I509 Heart failure, unspecified: Secondary | ICD-10-CM | POA: Diagnosis not present

## 2017-04-13 ENCOUNTER — Ambulatory Visit: Payer: Medicare Other | Admitting: Orthopedic Surgery

## 2017-04-20 ENCOUNTER — Ambulatory Visit: Payer: Medicare Other | Admitting: Orthopedic Surgery

## 2017-04-20 ENCOUNTER — Encounter: Payer: Self-pay | Admitting: Orthopedic Surgery

## 2017-04-21 DIAGNOSIS — M79603 Pain in arm, unspecified: Secondary | ICD-10-CM | POA: Diagnosis not present

## 2017-04-21 DIAGNOSIS — R0602 Shortness of breath: Secondary | ICD-10-CM | POA: Diagnosis not present

## 2017-04-21 DIAGNOSIS — M545 Low back pain: Secondary | ICD-10-CM | POA: Diagnosis not present

## 2017-04-29 ENCOUNTER — Ambulatory Visit: Payer: Medicare Other | Admitting: Orthopedic Surgery

## 2017-05-06 ENCOUNTER — Ambulatory Visit: Payer: Medicare Other | Admitting: Orthopedic Surgery

## 2017-05-09 DIAGNOSIS — I509 Heart failure, unspecified: Secondary | ICD-10-CM | POA: Diagnosis not present

## 2017-05-25 ENCOUNTER — Encounter (HOSPITAL_COMMUNITY): Payer: Self-pay | Admitting: Emergency Medicine

## 2017-05-25 ENCOUNTER — Emergency Department (HOSPITAL_COMMUNITY): Payer: Medicare Other

## 2017-05-25 ENCOUNTER — Emergency Department (HOSPITAL_COMMUNITY)
Admission: EM | Admit: 2017-05-25 | Discharge: 2017-05-26 | Disposition: A | Discharge status: Home / Self Care | Payer: Medicare Other | Attending: Emergency Medicine | Admitting: Emergency Medicine

## 2017-05-25 DIAGNOSIS — Z79899 Other long term (current) drug therapy: Secondary | ICD-10-CM | POA: Insufficient documentation

## 2017-05-25 DIAGNOSIS — R05 Cough: Secondary | ICD-10-CM | POA: Diagnosis not present

## 2017-05-25 DIAGNOSIS — J449 Chronic obstructive pulmonary disease, unspecified: Secondary | ICD-10-CM | POA: Diagnosis not present

## 2017-05-25 DIAGNOSIS — I2510 Atherosclerotic heart disease of native coronary artery without angina pectoris: Secondary | ICD-10-CM | POA: Diagnosis not present

## 2017-05-25 DIAGNOSIS — R1013 Epigastric pain: Secondary | ICD-10-CM

## 2017-05-25 DIAGNOSIS — R0602 Shortness of breath: Secondary | ICD-10-CM | POA: Diagnosis not present

## 2017-05-25 DIAGNOSIS — Z950 Presence of cardiac pacemaker: Secondary | ICD-10-CM | POA: Insufficient documentation

## 2017-05-25 DIAGNOSIS — I10 Essential (primary) hypertension: Secondary | ICD-10-CM | POA: Diagnosis not present

## 2017-05-25 DIAGNOSIS — R079 Chest pain, unspecified: Secondary | ICD-10-CM | POA: Diagnosis not present

## 2017-05-25 DIAGNOSIS — Z87891 Personal history of nicotine dependence: Secondary | ICD-10-CM | POA: Diagnosis not present

## 2017-05-25 DIAGNOSIS — R109 Unspecified abdominal pain: Secondary | ICD-10-CM | POA: Diagnosis not present

## 2017-05-25 LAB — CBC WITH DIFFERENTIAL/PLATELET
BASOS ABS: 0 10*3/uL (ref 0.0–0.1)
BASOS PCT: 0 %
Eosinophils Absolute: 0.1 10*3/uL (ref 0.0–0.7)
Eosinophils Relative: 1 %
HEMATOCRIT: 40.9 % (ref 36.0–46.0)
Hemoglobin: 13 g/dL (ref 12.0–15.0)
Lymphocytes Relative: 36 %
Lymphs Abs: 2.5 10*3/uL (ref 0.7–4.0)
MCH: 29.9 pg (ref 26.0–34.0)
MCHC: 31.8 g/dL (ref 30.0–36.0)
MCV: 94 fL (ref 78.0–100.0)
Monocytes Absolute: 0.8 10*3/uL (ref 0.1–1.0)
Monocytes Relative: 11 %
NEUTROS ABS: 3.5 10*3/uL (ref 1.7–7.7)
Neutrophils Relative %: 52 %
PLATELETS: 199 10*3/uL (ref 150–400)
RBC: 4.35 MIL/uL (ref 3.87–5.11)
RDW: 13.6 % (ref 11.5–15.5)
WBC: 6.8 10*3/uL (ref 4.0–10.5)

## 2017-05-25 LAB — BRAIN NATRIURETIC PEPTIDE: B NATRIURETIC PEPTIDE 5: 191 pg/mL — AB (ref 0.0–100.0)

## 2017-05-25 LAB — COMPREHENSIVE METABOLIC PANEL
ALT: 20 U/L (ref 14–54)
ANION GAP: 9 (ref 5–15)
AST: 25 U/L (ref 15–41)
Albumin: 4 g/dL (ref 3.5–5.0)
Alkaline Phosphatase: 84 U/L (ref 38–126)
BILIRUBIN TOTAL: 0.3 mg/dL (ref 0.3–1.2)
BUN: 9 mg/dL (ref 6–20)
CHLORIDE: 103 mmol/L (ref 101–111)
CO2: 26 mmol/L (ref 22–32)
Calcium: 9.7 mg/dL (ref 8.9–10.3)
Creatinine, Ser: 0.71 mg/dL (ref 0.44–1.00)
Glucose, Bld: 112 mg/dL — ABNORMAL HIGH (ref 65–99)
POTASSIUM: 3.6 mmol/L (ref 3.5–5.1)
Sodium: 138 mmol/L (ref 135–145)
TOTAL PROTEIN: 7.9 g/dL (ref 6.5–8.1)

## 2017-05-25 LAB — TROPONIN I: Troponin I: <0.03 ng/mL (ref <0.03)

## 2017-05-25 LAB — LIPASE, BLOOD: LIPASE: 36 U/L (ref 11–51)

## 2017-05-25 MED ORDER — SODIUM CHLORIDE 0.9 % IV SOLN
INTRAVENOUS | Status: DC
  Administered 2017-05-25: 21:00:00 via INTRAVENOUS

## 2017-05-25 MED ORDER — IOPAMIDOL (ISOVUE-370) INJECTION 76%
100.0000 mL | Freq: Once | INTRAVENOUS | Status: AC | PRN
  Administered 2017-05-25: 100 mL via INTRAVENOUS

## 2017-05-25 NOTE — Discharge Instructions (Signed)
Follow-up with your cardiologist as scheduled for tomorrow.  Extensive workup here tonight without any acute findings.  Shortness of breath may be related to the COPD.  No hypoxia.  Oxygen levels have been fine.  No evidence of fluid around the heart.  No evidence of pneumonia no blood clots in the lungs.  CT of the abdomen without any acute findings.  Return for any new or worse symptoms.

## 2017-05-25 NOTE — ED Triage Notes (Signed)
Pt c/o sob x 2 weeks since seeing pcp for the same. Pt states chest pain started today and sob is worse.

## 2017-05-25 NOTE — Progress Notes (Signed)
Cardiology Office Note  Date: 05/26/2017   ID: [name], DOB [birth year], MRN [id number]  PCP: Celene Squibb, MD  Primary Cardiologist: Rozann Lesches, MD   Chief Complaint  Patient presents with  . Coronary Artery Disease    History of Present Illness: [name] is a 81 y.o. female last seen in April. She is here today for a follow-up visit with her niece. She complains of progressive shortness of breath, no chest pain or palpitations. She states that she has been compliant with her medications and avoiding salt in her diet. She has seen Dr. Nevada Crane recently, in fact was in the ER last night for evaluation. BNP was mildly elevated at 191 and troponin I levels were negative. ECG showed an atrial paced rhythm with no acute ST segment changes. CT imaging of the chest, abdomen, and pelvis was negative for pulmonary embolus, showed mild emphysematous changes and scarring at the lung apices with atelectasis, no significant pleural effusions or edema, coronary artery calcifications and aortic atherosclerosis. I went over the results with her today.  She continues to follow with Dr. Lovena Le in the device clinic, Medtronic pacemaker in place. Device function has been normal on interrogation.  We discussed her medications. She reports compliance. She states that her blood pressure has been running high.  She has been under a lot of stress, her daughter has passed away after a long illness. Overall health seems to be gradually declining as well. Her tremors are worse. Appetite has not been as good.  I reviewed her last lipid panel which is outlined below.  Past Medical History:  Diagnosis Date  . Anemia    history  . Anxiety   . Atrial fibrillation   . Chronic back pain   . COPD (chronic obstructive pulmonary disease) ([place])   . Coronary atherosclerosis of native coronary artery    DES x 2 to RCA 10/10  . 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 Essential  hypertension, benign   . GERD (gastroesophageal reflux disease)   . H/O hiatal hernia   . Headache(784.0)   . HOH (hard of hearing)   . Hyperlipidemia   . Neuromuscular disorder ([place])    Tremors  . Pericardial effusion    Hemorrhagic   . Presence of permanent cardiac pacemaker   . Tachycardia-bradycardia syndrome ([place])    s/p Medtronic Adapta L dual chamber device  5/10    Past Surgical History:  Procedure Laterality Date  . APPENDECTOMY    . CARDIAC CATHETERIZATION  2010   stents x2.  Marland Kitchen CATARACT EXTRACTION    . CHOLECYSTECTOMY    . COLONOSCOPY  2011  . COLONOSCOPY N/A 10/19/2014   Procedure: COLONOSCOPY;  Surgeon: Rogene Houston, MD;  [facility];  Service: Endoscopy;  Laterality: N/A;  1030  . ESOPHAGOGASTRODUODENOSCOPY N/A 10/26/2014   Procedure: ESOPHAGOGASTRODUODENOSCOPY (EGD);  Surgeon: Rogene Houston, MD;  Location: [facility];  Service: Endoscopy;  Laterality: N/A;  230 - Dr. has lunch and learn  . Esophagogastroduodenoscopy with esophageal dilation  2004, 2006, 2007  . GIVENS CAPSULE STUDY N/A 10/31/2014   Procedure: GIVENS CAPSULE STUDY;  Surgeon: Rogene Houston, MD;  Location: [facility];  Service: Endoscopy;  Laterality: N/A;  730 -- pacemaker--needs monitoring--outpatient bed  . INSERT / REPLACE / REMOVE PACEMAKER  2010  . LEFT HEART CATHETERIZATION WITH CORONARY ANGIOGRAM N/A 11/17/2011   Procedure: LEFT HEART CATHETERIZATION WITH CORONARY ANGIOGRAM;  Surgeon: Sherren Mocha, MD;  Location: [facility] CATH LAB;  Service: Cardiovascular;  Laterality: N/A;  . LEFT HEART CATHETERIZATION WITH CORONARY ANGIOGRAM N/A 09/26/2014   Procedure: LEFT HEART CATHETERIZATION WITH CORONARY ANGIOGRAM;  Surgeon: Leonie Man, MD;  Location: [facility] CATH LAB;  Service: Cardiovascular;  Laterality: N/A;  . Right rotator cuff repair    . SHOULDER ACROMIOPLASTY Right 05/30/2015   Procedure: RIGHT SHOULDER ACROMIOPLASTY;  Surgeon: Carole Civil, MD;  Location: [facility] ORS;   Service: Orthopedics;  Laterality: Right;  . SHOULDER OPEN ROTATOR CUFF REPAIR Right 05/30/2015   Procedure: OPEN ROTATOR CUFF REPAIR RIGHT SHOULDER;  Surgeon: Carole Civil, MD;  Location: [facility] OR[facility];  Service: Orthopedics;  Laterality: Right;  . SHOULDER OPEN ROTATOR CUFF REPAIR Left 10/22/2016   Procedure: ROTATOR CUFF REPAIR SHOULDER OPEN;  Surgeon: Carole Civil, MD;  Location: [facility] OR[facility];  Service: Orthopedics;  Laterality: Left;  . Subxiphoid pericardial window  11/10  . VAGINAL HYSTERECTOMY      Current Outpatient Prescriptions  Medication Sig Dispense Refill  . ALPRAZolam (XANAX) 1 MG tablet Take 1 mg by mouth 2 (two) times daily as needed for anxiety or sleep.     Marland Kitchen amiodarone (PACERONE) 100 MG tablet Take 100 mg by mouth daily.    Marland Kitchen aspirin 81 MG tablet Take 81 mg by mouth daily.    . clopidogrel (PLAVIX) 75 MG tablet Take 75 mg by mouth daily.     . Cyanocobalamin (B-12 PO) Take 1 tablet by mouth daily.     Marland Kitchen esomeprazole (NEXIUM) 40 MG capsule Take 40 mg by mouth 2 (two) times daily as needed (acid reflux).     . folic acid (FOLVITE) 099 MCG tablet Take 800 mcg by mouth daily.    . furosemide (LASIX) 40 MG tablet Take 60 mg by mouth every evening.     . irbesartan (AVAPRO) 150 MG tablet Take 1 tablet (150 mg total) by mouth daily. 90 tablet 3  . metoprolol succinate (TOPROL XL) 25 MG 24 hr tablet Take 2 tablets (50 mg total) by mouth 2 (two) times daily. (Patient taking differently: Take 25-50 mg by mouth 2 (two) times daily. Takes 2 tablets in the morning and 1 tablet at night) 120 tablet 11  . nitroGLYCERIN (NITROSTAT) 0.4 MG SL tablet Place 0.4 mg under the tongue every 5 (five) minutes as needed for chest pain.     . Pediatric Multivitamins-Iron (FLINTSTONES PLUS IRON) chewable tablet Chew 1 tablet by mouth daily.    . potassium chloride SA (K-DUR,KLOR-CON) 20 MEQ tablet Take 20 mEq by mouth daily. Takes 1 tablet daily and a 2nd tablet only if has to take a 2nd dose of L[place]     . primidone (MYSOLINE) 50 MG tablet Take 50 mg by mouth at bedtime.     . simvastatin (ZOCOR) 5 MG tablet Take 5 mg by mouth every evening.    Marland Kitchen amLODipine (NORVASC) 10 MG tablet Take 1 tablet (10 mg total) by mouth daily. 90 tablet 3   No current facility-administered medications for this visit.    Allergies:  Amitriptyline hcl and Sulfonamide derivatives   Social History: The patient  reports that she quit smoking about 28 years ago. Her smoking use included Cigarettes. She has a 20.00 pack-year smoking history. She has never used smokeless tobacco. She reports that she does not drink alcohol or use drugs.   ROS:  Please see the history of present illness. Otherwise, complete review of systems is positive  for progressive hearing loss.  All other systems are reviewed and negative.   Physical Exam: VS:  BP (!) 178/98   Pulse 80   Wt 163 lb (73.9 kg)   SpO2 97%   BMI 26.71 kg/m , BMI Body mass index is 26.71 kg/m.  Wt Readings from Last 3 Encounters:  05/26/17 163 lb (73.9 kg)  03/24/17 164 lb 8 oz (74.6 kg)  02/05/17 161 lb (73 kg)    General: Chronically ill-appearing elderly woman, no distress. HEENT: Conjunctiva and lids normal, oropharynx clear. Neck: Supple, no elevated JVP or carotid bruits, no thyromegaly. Lungs: Decreased breath sounds without crackles or wheezing, nonlabored breathing at rest. Cardiac: Regular rate and rhythm, soft systolic murmur, no pericardial rub. Abdomen: Soft, nontender, bowel sounds present, no guarding or rebound. Extremities: No pitting edema, distal pulses 2+. Skin: Warm and dry. Musculoskeletal: No kyphosis. Neuropsychiatric: Alert and oriented x3, affect grossly appropriate.  ECG: I personally reviewed the tracing from 10/17/2016 which shows an atrial paced rhythm.  Recent Labwork: 09/16/2016: TSH 2.400 05/25/2017: ALT 20; AST 25; B Natriuretic Peptide 191.0; BUN 9; Creatinine, Ser 0.71; Hemoglobin 13.0; Platelets 199; Potassium 3.6;  Sodium 138     Component Value Date/Time   CHOL 239 (H) 09/16/2016 0714   TRIG 189 (H) 09/16/2016 0714   HDL 41 09/16/2016 0714   CHOLHDL 5.8 09/16/2016 0714   VLDL 38 09/16/2016 0714   LDLCALC 160 (H) 09/16/2016 0714    Other Studies Reviewed Today:  Echocardiogram 12/10/2015: Study Conclusions  - Left ventricle: The cavity size was normal. Wall thickness was   increased in a pattern of moderate LVH. There was focal basal   hypertrophy. Systolic function was normal. The estimated ejection   fraction was in the range of 60% to 65%. Wall motion was normal;   there were no regional wall motion abnormalities. Features are   consistent with a pseudonormal left ventricular filling pattern,   with concomitant abnormal relaxation and increased filling   pressure (grade 2 diastolic dysfunction). Doppler parameters are   consistent with high ventricular filling pressure. - Aortic valve: Moderately calcified annulus. Trileaflet;   moderately thickened leaflets. There was mild regurgitation.   Valve area (VTI): 2.32 cm^2. Valve area (Vmax): 2.3 cm^2.   Regurgitation pressure half-time: 633 ms. - Mitral valve: Moderately to severely calcified annulus. Mildly   thickened leaflets . There was mild regurgitation. - Left atrium: The atrium was severely dilated. - Right ventricle: The cavity size was mildly dilated. - Right atrium: The atrium was severely dilated. - Pulmonary arteries: Systolic pressure was mildly increased. PA   peak pressure: 36 mm Hg (S). - Technically adequate study.  Lexiscan Myoview 08/25/2014: IMPRESSION: 1. Small region of scar with mild peri-infarct ischemia in the inferolateral wall.  2. Normal left ventricular wall motion.  3. Left ventricular ejection fraction 56%  4. Low-risk stress test findings*.  Assessment and Plan:  1. Progressive shortness of breath, likely multifactorial. She does not report distinct angina or palpitations. Recent ER evaluation  revealed normal troponin I levels, no acute ST segment changes, only mildly elevated BNP but no evidence of pulmonary edema or pleural effusions by chest CT imaging. Blood pressure is not optimally controlled, we will increase Norvasc to 10 mg daily. Also follow-up echocardiogram to assure stability in LVEF.  2. CAD status post DES to the RCA in 2010, stable coronary anatomy by catheterization in 2016. We will continue with medical therapy now on the absence of progressive angina.  3. Essential hypertension, increasing Norvasc to 10 mg daily for better control.  4. Tachycardia-bradycardia syndrome with Medtronic pacemaker in place. Normal function by device interrogation per Dr. Lovena Le.  5. Paroxysmal atrial fibrillation, continuing on low-dose amiodarone. She is not anticoagulated with prior history of hemorrhagic pericardial effusion and increased bleeding risk.  Current medicines were reviewed with the patient today.   Orders Placed This Encounter  Procedures  . ECHOCARDIOGRAM COMPLETE    Disposition: Call with test results, tentative follow-up in 6 months.  Signed, Satira Sark, MD, [facility] 05/26/2017 10:08 AM    [facility] [place]. [address], [place], [place] [address] Phone: [phone]; Fax: [phone]

## 2017-05-25 NOTE — ED Provider Notes (Signed)
[facility] EMERGENCY DEPARTMENT Provider Note   CSN: [id number] Arrival date & time: 05/25/17  1940     History   Chief Complaint Chief Complaint  Patient presents with  . Chest Pain    HPI [name] is a 81 y.o. female.  Patient lives by herself.  Brought in by friend family member.  Patient is concerned about shortness of breath and chest pain but she points to her epigastric area.  Patient already followed by cardiology.  Patient has a pacemaker.  Patient states symptoms have been present for about 2 weeks.  Patient also has a known history of COPD.  Chest discomfort and epigastric discomfort started earlier today.  In the morning.  States that her shortness of breath is worse.  Patient is hard of hearing but otherwise is mentating well.      Past Medical History:  Diagnosis Date  . Anemia    history  . Anxiety   . Atrial fibrillation   . Chronic back pain   . COPD (chronic obstructive pulmonary disease) ([place])   . Coronary atherosclerosis of native coronary artery    DES x 2 to RCA 10/10  . 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 Essential hypertension, benign   . GERD (gastroesophageal reflux disease)   . H/O hiatal hernia   . Headache(784.0)   . HOH (hard of hearing)   . Hyperlipidemia   . Neuromuscular disorder ([place])    Tremors  . Pericardial effusion    Hemorrhagic   . Presence of permanent cardiac pacemaker   . Tachycardia-bradycardia syndrome [facility])    s/p Medtronic Adapta L dual chamber device  5/10    Patient Active Problem List   Diagnosis Date Noted  . Nonspecific chest pain 09/15/2016  . Complete rotator cuff tear of left shoulder   . Anemia 10/31/2014  . Chest pain with moderate risk for cardiac etiology 08/23/2014  . Diastolic dysfunction with chronic heart failure ([place]) 08/23/2014  . PERICARDIAL EFFUSION 07/10/2009  . Essential hypertension, benign 04/18/2009  . Atherosclerosis of native coronary artery with  angina pectoris ([place]) 04/18/2009  . PPM-Medtronic 04/04/2009  . BRADYCARDIA-TACHYCARDIA SYNDROME 01/26/2009  . HLD (hyperlipidemia) 12/19/2008  . Anxiety state 12/19/2008  . Atrial fibrillation ([place]) 12/19/2008  . GERD 12/19/2008  . Abnormal involuntary movement 12/19/2008    Past Surgical History:  Procedure Laterality Date  . APPENDECTOMY    . CARDIAC CATHETERIZATION  2010   stents x2.  Marland Kitchen CATARACT EXTRACTION    . CHOLECYSTECTOMY    . COLONOSCOPY  2011  . COLONOSCOPY N/A 10/19/2014   Procedure: COLONOSCOPY;  Surgeon: Rogene Houston, MD;  Location: [facility] ENDO SUITE;  Service: Endoscopy;  Laterality: N/A;  1030  . ESOPHAGOGASTRODUODENOSCOPY N/A 10/26/2014   Procedure: ESOPHAGOGASTRODUODENOSCOPY (EGD);  Surgeon: Rogene Houston, MD;  Location: [facility] ENDO SUITE;  Service: Endoscopy;  Laterality: N/A;  230 - Dr. has lunch and learn  . Esophagogastroduodenoscopy with esophageal dilation  2004, 2006, 2007  . GIVENS CAPSULE STUDY N/A 10/31/2014   Procedure: GIVENS CAPSULE STUDY;  Surgeon: Rogene Houston, MD;  Location: [facility] ENDO SUITE;  Service: Endoscopy;  Laterality: N/A;  730 -- pacemaker--needs monitoring--outpatient bed  . INSERT / REPLACE / REMOVE PACEMAKER  2010  . LEFT HEART CATHETERIZATION WITH CORONARY ANGIOGRAM N/A 11/17/2011   Procedure: LEFT HEART CATHETERIZATION WITH CORONARY ANGIOGRAM;  Surgeon: Sherren Mocha, MD;  Location: [facility] CATH LAB;  Service: Cardiovascular;  Laterality: N/A;  . LEFT  HEART CATHETERIZATION WITH CORONARY ANGIOGRAM N/A 09/26/2014   Procedure: LEFT HEART CATHETERIZATION WITH CORONARY ANGIOGRAM;  Surgeon: Leonie Man, MD;  Location: [facility] CATH LAB;  Service: Cardiovascular;  Laterality: N/A;  . Right rotator cuff repair    . SHOULDER ACROMIOPLASTY Right 05/30/2015   Procedure: RIGHT SHOULDER ACROMIOPLASTY;  Surgeon: Carole Civil, MD;  Location: [facility] ORS;  Service: Orthopedics;  Laterality: Right;  . SHOULDER OPEN ROTATOR CUFF REPAIR Right 05/30/2015   Procedure:  OPEN ROTATOR CUFF REPAIR RIGHT SHOULDER;  Surgeon: Carole Civil, MD;  Location: [facility] ORS;  Service: Orthopedics;  Laterality: Right;  . SHOULDER OPEN ROTATOR CUFF REPAIR Left 10/22/2016   Procedure: ROTATOR CUFF REPAIR SHOULDER OPEN;  Surgeon: Carole Civil, MD;  Location: [facility] ORS;  Service: Orthopedics;  Laterality: Left;  . Subxiphoid pericardial window  11/10  . VAGINAL HYSTERECTOMY      OB History    Gravida Para Term Preterm AB Living   6 5 5   1 3    SAB TAB Ectopic Multiple Live Births   1               Home Medications    Prior to Admission medications   Medication Sig Start Date End Date Taking? Authorizing Provider  ALPRAZolam Duanne Moron) 1 MG tablet Take 1 mg by mouth 2 (two) times daily as needed for anxiety or sleep.    Yes [provider]  amiodarone (PACERONE) 100 MG tablet Take 100 mg by mouth daily.   Yes [provider]  amLODipine (NORVASC) 5 MG tablet Take 5 mg by mouth at bedtime.   Yes [provider]  aspirin 81 MG tablet Take 81 mg by mouth daily.   Yes [provider]  clopidogrel (PLAVIX) 75 MG tablet Take 75 mg by mouth daily.    Yes [provider]  Cyanocobalamin (B-12 PO) Take 1 tablet by mouth daily.    Yes [provider]  esomeprazole (NEXIUM) 40 MG capsule Take 40 mg by mouth 2 (two) times daily as needed (acid reflux).    Yes [provider]  folic acid (FOLVITE) 211 MCG tablet Take 800 mcg by mouth daily.   Yes [provider]  furosemide (LASIX) 40 MG tablet Take 60 mg by mouth every evening.  12/04/11  Yes Satira Sark, MD  irbesartan (AVAPRO) 150 MG tablet Take 1 tablet (150 mg total) by mouth daily. 02/20/17  Yes Satira Sark, MD  metoprolol succinate (TOPROL XL) 25 MG 24 hr tablet Take 2 tablets (50 mg total) by mouth 2 (two) times daily. Patient taking differently: Take 25-50 mg by mouth 2 (two) times daily. Takes 2 tablets in the morning and 1 tablet at night  07/07/16  Yes Satira Sark, MD  nitroGLYCERIN (NITROSTAT) 0.4 MG SL tablet Place 0.4 mg under the tongue every 5 (five) minutes as needed for chest pain.    Yes [provider]  Pediatric Multivitamins-Iron (FLINTSTONES PLUS IRON) chewable tablet Chew 1 tablet by mouth daily. 07/22/16  Yes Rehman, Mechele Dawley, MD  potassium chloride SA (K-DUR,KLOR-CON) 20 MEQ tablet Take 20 mEq by mouth daily. Takes 1 tablet daily and a 2nd tablet only if has to take a 2nd dose of [place]   Yes Rexene Alberts, MD  primidone (MYSOLINE) 50 MG tablet Take 50 mg by mouth at bedtime.  10/31/10  Yes [provider]  simvastatin (ZOCOR) 5 MG tablet Take 5 mg by mouth every evening.  Yes [provider]    Family History Family History  Problem Relation Age of Onset  . Cancer Mother        Colon   . Coronary artery disease Sister   . Coronary artery disease Brother   . Arthritis Unknown   . Lung disease Unknown   . Asthma Unknown     Social History Social History  Substance Use Topics  . Smoking status: Former Smoker    Packs/day: 2.00    Years: 10.00    Types: Cigarettes    Quit date: 05/23/1989  . Smokeless tobacco: Never Used  . Alcohol use No     Allergies   Amitriptyline hcl and Sulfonamide derivatives   Review of Systems Review of Systems  Constitutional: Negative for fever.  HENT: Negative for congestion.   Eyes: Negative for redness.  Respiratory: Positive for shortness of breath.   Cardiovascular: Positive for chest pain.  Gastrointestinal: Positive for abdominal pain. Negative for diarrhea, nausea and vomiting.  Genitourinary: Negative for dysuria.  Musculoskeletal: Negative for back pain.  Skin: Negative for rash.  Neurological: Negative for dizziness, syncope and headaches.  Hematological: Does not bruise/bleed easily.  Psychiatric/Behavioral: Negative for confusion.     Physical Exam Updated Vital Signs BP (!) 170/93   Pulse 61   Temp 97.8 F  (36.6 C)   Resp (!) 23   SpO2 92%   Physical Exam  Constitutional: She is oriented to person, place, and time. She appears well-developed and well-nourished. No distress.  HENT:  Head: Normocephalic and atraumatic.  Mouth/Throat: Oropharynx is clear and moist.  Eyes: Pupils are equal, round, and reactive to light. Conjunctivae and EOM are normal.  Neck: Normal range of motion. Neck supple.  Cardiovascular: Normal rate, regular rhythm and normal heart sounds.   Pulmonary/Chest: Effort normal and breath sounds normal. No respiratory distress. She has no wheezes. She has no rales. She exhibits no tenderness.  Abdominal: Soft. Bowel sounds are normal. There is no tenderness.  Musculoskeletal: Normal range of motion.  Neurological: She is alert and oriented to person, place, and time. No cranial nerve deficit or sensory deficit. She exhibits normal muscle tone. Coordination normal.  Skin: Skin is warm. No rash noted.  Nursing note and vitals reviewed.    ED Treatments / Results  Labs (all labs ordered are listed, but only abnormal results are displayed) Labs Reviewed  COMPREHENSIVE METABOLIC PANEL - Abnormal; Notable for the following:       Result Value   Glucose, Bld 112 (*)    All other components within normal limits  BRAIN NATRIURETIC PEPTIDE - Abnormal; Notable for the following:    B Natriuretic Peptide 191.0 (*)    All other components within normal limits  LIPASE, BLOOD  CBC WITH DIFFERENTIAL/PLATELET  TROPONIN I    EKG  EKG Interpretation  Date/Time:  Monday May 25 2017 19:48:24 EDT Ventricular Rate:  78 PR Interval:    QRS Duration: 93 QT Interval:  424 QTC Calculation: 483 R Axis:   23 Text Interpretation:  Atrial-paced rhythm Low voltage, precordial leads Confirmed by Fredia Sorrow [phone]) on 05/25/2017 10:05:17 PM       Radiology Dg Chest 2 View  Result Date: 05/25/2017 CLINICAL DATA:  Right-sided chest pain, shortness of breath and cough.  EXAM: CHEST  2 VIEW COMPARISON:  Chest x-ray dated 09/15/2016. FINDINGS: Cardiomegaly is stable. Atherosclerotic changes noted at the aortic arch. Left chest wall pacemaker/ICD apparatus appears stable in position. Lungs are at least  mildly hyperexpanded suggesting COPD. Lungs are clear. No pleural effusion or pneumothorax seen. No acute or suspicious osseous finding. IMPRESSION: 1. No active cardiopulmonary disease. No evidence of pneumonia or pulmonary edema. 2. Hyperexpanded lungs suggesting COPD. 3. Stable cardiomegaly 4. Aortic atherosclerosis. Electronically Signed   By: Franki Cabot M.D.   On: 05/25/2017 21:41   Ct Angio Chest Pe W/cm &/or Wo Cm  Result Date: 05/25/2017 CLINICAL DATA:  Subacute onset of shortness of breath and generalized chest pain. Abdominal discomfort. Initial encounter. EXAM: CT ANGIOGRAPHY CHEST CT ABDOMEN AND PELVIS WITH CONTRAST TECHNIQUE: Multidetector CT imaging of the chest was performed using the standard protocol during bolus administration of intravenous contrast. Multiplanar CT image reconstructions and MIPs were obtained to evaluate the vascular anatomy. Multidetector CT imaging of the abdomen and pelvis was performed using the standard protocol during bolus administration of intravenous contrast. CONTRAST:  100 mL of Isovue 370 IV contrast COMPARISON:  CTA of the chest performed 12/07/2009, and CT of the abdomen and pelvis from 02/03/2012 FINDINGS: CTA CHEST FINDINGS Cardiovascular:  There is no evidence of pulmonary embolus. The heart is normal in size. Diffuse coronary artery calcifications are seen. Scattered calcification is noted along the aortic arch and proximal great vessels. Mediastinum/Nodes: No mediastinal lymphadenopathy is seen. No pericardial effusion is identified. The thyroid gland is grossly unremarkable. No axillary lymphadenopathy is appreciated. A pacemaker is noted at the left chest wall, with leads ending at the right atrium and right ventricle.  Lungs/Pleura: Mild emphysema and scarring are noted at the lung apices. Minimal left basilar atelectasis is noted. No pleural effusion or pneumothorax is seen. No masses are identified. Musculoskeletal: No acute osseous abnormalities are identified. Multilevel vacuum phenomenon is noted along the lower thoracic spine. The patient is status post right-sided rotator cuff repair. The visualized musculature is unremarkable in appearance. Review of the MIP images confirms the above findings. CT ABDOMEN and PELVIS FINDINGS Hepatobiliary: Scattered calcified granulomata are seen within the liver. The patient is status post cholecystectomy, with clips noted at the gallbladder fossa. The common bile duct is grossly unremarkable. Pancreas: The pancreas is within normal limits. Spleen: Scattered calcified granulomata are noted within the spleen. The spleen is otherwise unremarkable. Adrenals/Urinary Tract: Mild left renal scarring is noted. There is no evidence of hydronephrosis. No renal or ureteral stones are identified. No perinephric stranding is seen. Stomach/Bowel: The stomach is unremarkable in appearance. The small bowel is within normal limits. The patient is status post appendectomy. The colon is unremarkable in appearance. Vascular/Lymphatic: Scattered calcification is seen along the abdominal aorta and its branches. The abdominal aorta is otherwise grossly unremarkable. The inferior vena cava is grossly unremarkable. No retroperitoneal lymphadenopathy is seen. No pelvic sidewall lymphadenopathy is identified. Reproductive: The bladder is mildly distended and grossly unremarkable. The patient is status post hysterectomy. No suspicious adnexal masses are seen. The ovaries are relatively symmetric. Other: No additional soft tissue abnormalities are seen. Musculoskeletal: No acute osseous abnormalities are identified. Multilevel vacuum phenomenon is noted along the lumbar spine. The visualized musculature is  unremarkable in appearance. Review of the MIP images confirms the above findings. IMPRESSION: 1. No evidence of pulmonary embolus. 2. No acute abnormality seen within the abdomen or pelvis. 3. Mild emphysema and scarring at the lung apices. Minimal left basilar atelectasis noted. 4. Diffuse coronary artery calcifications seen. 5. Scattered aortic atherosclerosis. 6. Mild degenerative change along the lower thoracic and lumbar spine. Electronically Signed   By: Garald Balding M.D.   On:  05/25/2017 23:09   Ct Abdomen Pelvis W Contrast  Result Date: 05/25/2017 CLINICAL DATA:  Subacute onset of shortness of breath and generalized chest pain. Abdominal discomfort. Initial encounter. EXAM: CT ANGIOGRAPHY CHEST CT ABDOMEN AND PELVIS WITH CONTRAST TECHNIQUE: Multidetector CT imaging of the chest was performed using the standard protocol during bolus administration of intravenous contrast. Multiplanar CT image reconstructions and MIPs were obtained to evaluate the vascular anatomy. Multidetector CT imaging of the abdomen and pelvis was performed using the standard protocol during bolus administration of intravenous contrast. CONTRAST:  100 mL of Isovue 370 IV contrast COMPARISON:  CTA of the chest performed 12/07/2009, and CT of the abdomen and pelvis from 02/03/2012 FINDINGS: CTA CHEST FINDINGS Cardiovascular:  There is no evidence of pulmonary embolus. The heart is normal in size. Diffuse coronary artery calcifications are seen. Scattered calcification is noted along the aortic arch and proximal great vessels. Mediastinum/Nodes: No mediastinal lymphadenopathy is seen. No pericardial effusion is identified. The thyroid gland is grossly unremarkable. No axillary lymphadenopathy is appreciated. A pacemaker is noted at the left chest wall, with leads ending at the right atrium and right ventricle. Lungs/Pleura: Mild emphysema and scarring are noted at the lung apices. Minimal left basilar atelectasis is noted. No pleural  effusion or pneumothorax is seen. No masses are identified. Musculoskeletal: No acute osseous abnormalities are identified. Multilevel vacuum phenomenon is noted along the lower thoracic spine. The patient is status post right-sided rotator cuff repair. The visualized musculature is unremarkable in appearance. Review of the MIP images confirms the above findings. CT ABDOMEN and PELVIS FINDINGS Hepatobiliary: Scattered calcified granulomata are seen within the liver. The patient is status post cholecystectomy, with clips noted at the gallbladder fossa. The common bile duct is grossly unremarkable. Pancreas: The pancreas is within normal limits. Spleen: Scattered calcified granulomata are noted within the spleen. The spleen is otherwise unremarkable. Adrenals/Urinary Tract: Mild left renal scarring is noted. There is no evidence of hydronephrosis. No renal or ureteral stones are identified. No perinephric stranding is seen. Stomach/Bowel: The stomach is unremarkable in appearance. The small bowel is within normal limits. The patient is status post appendectomy. The colon is unremarkable in appearance. Vascular/Lymphatic: Scattered calcification is seen along the abdominal aorta and its branches. The abdominal aorta is otherwise grossly unremarkable. The inferior vena cava is grossly unremarkable. No retroperitoneal lymphadenopathy is seen. No pelvic sidewall lymphadenopathy is identified. Reproductive: The bladder is mildly distended and grossly unremarkable. The patient is status post hysterectomy. No suspicious adnexal masses are seen. The ovaries are relatively symmetric. Other: No additional soft tissue abnormalities are seen. Musculoskeletal: No acute osseous abnormalities are identified. Multilevel vacuum phenomenon is noted along the lumbar spine. The visualized musculature is unremarkable in appearance. Review of the MIP images confirms the above findings. IMPRESSION: 1. No evidence of pulmonary embolus. 2. No  acute abnormality seen within the abdomen or pelvis. 3. Mild emphysema and scarring at the lung apices. Minimal left basilar atelectasis noted. 4. Diffuse coronary artery calcifications seen. 5. Scattered aortic atherosclerosis. 6. Mild degenerative change along the lower thoracic and lumbar spine. Electronically Signed   By: Garald Balding M.D.   On: 05/25/2017 23:09    Procedures Procedures (including critical care time)  Medications Ordered in ED Medications  0.9 %  sodium chloride infusion ( Intravenous New Bag/Given 05/25/17 2110)  iopamidol (ISOVUE-370) 76 % injection 100 mL (100 mLs Intravenous Contrast Given 05/25/17 2231)     Initial Impression / Assessment and Plan / ED Course  I have reviewed the triage vital signs and the nursing notes.  Pertinent labs & imaging results that were available during my care of the patient were reviewed by me and considered in my medical decision making (see chart for details).    Patient has follow-up with cardiology already scheduled for tomorrow.  Patient was concerned about fluid around her heart because her primary care doctor stated she had that.  CT chest angioma without pulmonary embolus without pneumonia without pericardial effusion.  CT abdomen without any acute findings.  Patient's gallbladder is already been removed.  Patient very stable here with sats in the upper 90s on room air.  The patient does have evidence of COPD but no evidence of any wheezing here today.  In addition patient's troponin was negative.  Since the chest pain started this morning unlikely that it is an acute cardiac event.  Patient appears nontoxic no acute distress.  Patient stable for discharge home and follow-up with cardiology as scheduled for tomorrow.   Final Clinical Impressions(s) / ED Diagnoses   Final diagnoses:  SOB (shortness of breath)  Epigastric pain    New Prescriptions New Prescriptions   No medications on file     Fredia Sorrow,  MD 05/26/17 [phone]

## 2017-05-26 ENCOUNTER — Encounter: Payer: Self-pay | Admitting: Cardiology

## 2017-05-26 ENCOUNTER — Ambulatory Visit (INDEPENDENT_AMBULATORY_CARE_PROVIDER_SITE_OTHER): Payer: Medicare Other | Admitting: Cardiology

## 2017-05-26 VITALS — BP 178/98 | HR 80 | Wt 163.0 lb

## 2017-05-26 DIAGNOSIS — R0602 Shortness of breath: Secondary | ICD-10-CM

## 2017-05-26 DIAGNOSIS — I480 Paroxysmal atrial fibrillation: Secondary | ICD-10-CM

## 2017-05-26 DIAGNOSIS — I10 Essential (primary) hypertension: Secondary | ICD-10-CM

## 2017-05-26 DIAGNOSIS — I2510 Atherosclerotic heart disease of native coronary artery without angina pectoris: Secondary | ICD-10-CM | POA: Diagnosis not present

## 2017-05-26 DIAGNOSIS — Z950 Presence of cardiac pacemaker: Secondary | ICD-10-CM

## 2017-05-26 MED ORDER — AMLODIPINE BESYLATE 10 MG PO TABS: 10.0000 mg | ORAL_TABLET | Freq: Every day | ORAL | 3 refills | Status: DC

## 2017-05-26 NOTE — Patient Instructions (Signed)
Your physician wants you to follow-up in: 6 months with Dr.McDowell You will receive a reminder letter in the mail two months in advance. If you don't receive a letter, please call our office to schedule the follow-up appointment.   Your physician has requested that you have an echocardiogram. Echocardiography is a painless test that uses sound waves to create images of your heart. It provides your doctor with information about the size and shape of your heart and how well your heart's chambers and valves are working. This procedure takes approximately one hour. There are no restrictions for this procedure. We will call you with results.   INCREASE Amlodipine to 10 mg daily    If you need a refill on your cardiac medications before your next appointment, please call your pharmacy.     No lab work ordered today.      Thank you for choosing [facility] !

## 2017-06-01 ENCOUNTER — Ambulatory Visit (HOSPITAL_COMMUNITY)
Admission: RE | Admit: 2017-06-01 | Discharge: 2017-06-01 | Disposition: A | Discharge status: Home / Self Care | Payer: Medicare Other | Source: Ambulatory Visit | Attending: Cardiology | Admitting: Cardiology

## 2017-06-01 DIAGNOSIS — I495 Sick sinus syndrome: Secondary | ICD-10-CM | POA: Diagnosis not present

## 2017-06-01 DIAGNOSIS — Z87891 Personal history of nicotine dependence: Secondary | ICD-10-CM | POA: Diagnosis not present

## 2017-06-01 DIAGNOSIS — R0602 Shortness of breath: Secondary | ICD-10-CM

## 2017-06-01 DIAGNOSIS — I10 Essential (primary) hypertension: Secondary | ICD-10-CM | POA: Diagnosis not present

## 2017-06-01 DIAGNOSIS — E785 Hyperlipidemia, unspecified: Secondary | ICD-10-CM | POA: Insufficient documentation

## 2017-06-01 DIAGNOSIS — H26493 Other secondary cataract, bilateral: Secondary | ICD-10-CM | POA: Diagnosis not present

## 2017-06-01 DIAGNOSIS — I083 Combined rheumatic disorders of mitral, aortic and tricuspid valves: Secondary | ICD-10-CM | POA: Insufficient documentation

## 2017-06-01 DIAGNOSIS — I4891 Unspecified atrial fibrillation: Secondary | ICD-10-CM | POA: Diagnosis not present

## 2017-06-01 NOTE — Progress Notes (Signed)
*  PRELIMINARY RESULTS* Echocardiogram 2D Echocardiogram has been performed.  Leavy Cella 06/01/2017, 11:57 AM

## 2017-06-04 ENCOUNTER — Telehealth: Payer: Self-pay | Admitting: Cardiology

## 2017-06-04 NOTE — Telephone Encounter (Signed)
Please give pt a call concerning her medication--she has a few questions

## 2017-06-04 NOTE — Telephone Encounter (Signed)
I spoke with Santo Held -D at [place], pt is to stay on Avapro in addition to Amlodipine, he will tell patient

## 2017-06-04 NOTE — Telephone Encounter (Signed)
LMTCB

## 2017-06-09 DIAGNOSIS — I509 Heart failure, unspecified: Secondary | ICD-10-CM | POA: Diagnosis not present

## 2017-07-09 DIAGNOSIS — I509 Heart failure, unspecified: Secondary | ICD-10-CM | POA: Diagnosis not present

## 2017-07-24 ENCOUNTER — Other Ambulatory Visit: Payer: Self-pay | Admitting: Cardiology

## 2017-08-09 DIAGNOSIS — I509 Heart failure, unspecified: Secondary | ICD-10-CM | POA: Diagnosis not present

## 2017-08-10 ENCOUNTER — Encounter: Payer: Self-pay | Admitting: Orthopedic Surgery

## 2017-08-10 ENCOUNTER — Ambulatory Visit (INDEPENDENT_AMBULATORY_CARE_PROVIDER_SITE_OTHER): Payer: Medicare Other

## 2017-08-10 ENCOUNTER — Ambulatory Visit: Payer: Medicare Other | Admitting: Orthopedic Surgery

## 2017-08-10 VITALS — BP 163/83 | HR 67 | Ht 65.0 in | Wt 158.0 lb

## 2017-08-10 DIAGNOSIS — M47816 Spondylosis without myelopathy or radiculopathy, lumbar region: Secondary | ICD-10-CM | POA: Diagnosis not present

## 2017-08-10 DIAGNOSIS — G8929 Other chronic pain: Secondary | ICD-10-CM

## 2017-08-10 DIAGNOSIS — M5442 Lumbago with sciatica, left side: Secondary | ICD-10-CM | POA: Diagnosis not present

## 2017-08-10 DIAGNOSIS — M5441 Lumbago with sciatica, right side: Secondary | ICD-10-CM

## 2017-08-10 MED ORDER — METHYLPREDNISOLONE ACETATE 40 MG/ML IJ SUSP
40.0000 mg | Freq: Once | INTRAMUSCULAR | Status: AC
  Administered 2017-08-10: 40 mg via INTRAMUSCULAR

## 2017-08-10 MED ORDER — TRAMADOL HCL 50 MG PO TABS: 50.0000 mg | ORAL_TABLET | Freq: Four times a day (QID) | ORAL | 5 refills | Status: DC | PRN

## 2017-08-10 NOTE — Addendum Note (Signed)
Addended byCandice Camp on: 08/10/2017 02:07 PM   Modules accepted: Orders

## 2017-08-10 NOTE — Progress Notes (Signed)
New problem  Chief Complaint  Patient presents with  . Back Pain    long duration of low back pain    82 year old female comes in for evaluation of acute onset lower back pain with bilateral foot numbness  This is a 82 year old female complains of severe lower back pain radiating to both legs associated with numbness and tingling of both feet poor balance and severe constant pain.  She reports pain for several months history of prior back pain treated with injection  Currently taking Tylenol without relief.  She went to the ER she said nothing was done  My last x-rays from March 2017     Review of Systems  Constitutional: Negative for chills, fever and weight loss.  HENT: Positive for hearing loss.   Respiratory: Negative for shortness of breath.   Cardiovascular: Negative for chest pain.  Gastrointestinal: Negative.   Genitourinary: Negative.   Musculoskeletal: Positive for back pain, joint pain and neck pain.  Neurological: Positive for tremors, sensory change and focal weakness. Negative for tingling.  Psychiatric/Behavioral: The patient is nervous/anxious.    Past Medical History:  Diagnosis Date  . Anemia    history  . Anxiety   . Atrial fibrillation   . Chronic back pain   . COPD (chronic obstructive pulmonary disease) ([place])   . Coronary atherosclerosis of native coronary artery    DES x 2 to RCA 10/10  . 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 Essential hypertension, benign   . GERD (gastroesophageal reflux disease)   . H/O hiatal hernia   . Headache(784.0)   . HOH (hard of hearing)   . Hyperlipidemia   . Neuromuscular disorder ([place])    Tremors  . Pericardial effusion    Hemorrhagic   . Presence of permanent cardiac pacemaker   . Tachycardia-bradycardia syndrome (HCC)    s/p Medtronic Adapta L dual chamber device  5/10   BP (!) 163/83   Pulse 67   Ht 5\' 5"  (1.651 m)   Wt 158 lb (71.7 kg)   BMI 26.29 kg/m   Physical Exam    Constitutional: She is oriented to person, place, and time. She appears well-developed and well-nourished.  Musculoskeletal:       Arms:      Legs: Neurological: She is alert and oriented to person, place, and time. She displays tremor. She displays no atrophy. No cranial nerve deficit or sensory deficit. She exhibits normal muscle tone. Gait abnormal.  Reflex Scores:      Tricep reflexes are 2+ on the right side and 2+ on the left side.      Bicep reflexes are 2+ on the right side and 2+ on the left side.      Patellar reflexes are 0 on the right side and 0 on the left side.      Achilles reflexes are 0 on the right side and 0 on the left side. Abnormal gait.  The patient has a slow gait she has a shuffling gait she does not pick her feet up much off the ground.  Psychiatric: She has a normal mood and affect. Judgment normal.  Vitals reviewed.   The last x-ray I have on her shows severe osteopenia with coronal and sagittal plane malalignment.  Wrist levels of facet arthritis and disc space narrowing  Today's x-ray severe lumbar degenerative scoliosis coronal plane malalignment asymmetric hip height with calcification of the large vessels of the abdomen multi levels of joint space  narrowing facet arthrosis narrowing sclerosis no acute fracture  Encounter Diagnoses  Name Primary?  . Chronic midline low back pain with bilateral sciatica   . Lumbar spondylosis Yes    Recommend  IM shot of Depo-Medrol Physical therapy Short course of tramadol then resume Tylenol  IM shot buttock 40 mg Depo-Medrol delivered by nurse

## 2017-09-09 DIAGNOSIS — I509 Heart failure, unspecified: Secondary | ICD-10-CM | POA: Diagnosis not present

## 2017-09-22 DIAGNOSIS — D509 Iron deficiency anemia, unspecified: Secondary | ICD-10-CM | POA: Diagnosis not present

## 2017-09-22 DIAGNOSIS — K224 Dyskinesia of esophagus: Secondary | ICD-10-CM | POA: Diagnosis not present

## 2017-09-22 DIAGNOSIS — I480 Paroxysmal atrial fibrillation: Secondary | ICD-10-CM | POA: Diagnosis not present

## 2017-09-22 DIAGNOSIS — K219 Gastro-esophageal reflux disease without esophagitis: Secondary | ICD-10-CM | POA: Diagnosis not present

## 2017-09-22 DIAGNOSIS — I10 Essential (primary) hypertension: Secondary | ICD-10-CM | POA: Diagnosis not present

## 2017-09-24 DIAGNOSIS — I10 Essential (primary) hypertension: Secondary | ICD-10-CM | POA: Diagnosis not present

## 2017-10-07 DIAGNOSIS — I509 Heart failure, unspecified: Secondary | ICD-10-CM | POA: Diagnosis not present

## 2017-10-31 ENCOUNTER — Other Ambulatory Visit: Payer: Self-pay | Admitting: Cardiology

## 2017-11-07 DIAGNOSIS — I509 Heart failure, unspecified: Secondary | ICD-10-CM | POA: Diagnosis not present

## 2017-11-28 DIAGNOSIS — I480 Paroxysmal atrial fibrillation: Secondary | ICD-10-CM | POA: Diagnosis not present

## 2017-11-28 DIAGNOSIS — K224 Dyskinesia of esophagus: Secondary | ICD-10-CM | POA: Diagnosis not present

## 2017-11-28 DIAGNOSIS — R05 Cough: Secondary | ICD-10-CM | POA: Diagnosis not present

## 2017-11-28 DIAGNOSIS — D509 Iron deficiency anemia, unspecified: Secondary | ICD-10-CM | POA: Diagnosis not present

## 2017-11-28 DIAGNOSIS — R0602 Shortness of breath: Secondary | ICD-10-CM | POA: Diagnosis not present

## 2017-11-30 NOTE — Progress Notes (Signed)
Cardiology Office Note  Date: 12/01/2017   ID: ROZELL KETTLEWELL, DOB [birth year], MRN [id number]  PCP: Celene Squibb, MD  Primary Cardiologist: Rozann Lesches, MD   Chief Complaint  Patient presents with  . Coronary Artery Disease    History of Present Illness: [name] is an 82 y.o. female last seen in October 2018.  She is here with a friend for a follow-up visit.  She does not report any chest pain and remains chronically short of breath as before.  No wheezing or hospitalizations.  She continues to follow with Dr. Nevada Crane.  She follows in the device clinic, sees Dr. Lovena Le with history of Medtronic pacemaker.  Reports occasional feeling of a skip but no palpitations.  Follow-up echocardiogram in October 2018 revealed LVEF 65 to 70% with mild mitral and aortic regurgitation.  She is following routine lab work with Dr. Nevada Crane.  Current cardiac regimen includes amiodarone, Norvasc, aspirin, Plavix, Lasix, Avapro, Toprol-XL, Zocor, and potassium supplements.  Weight is down about 5 pounds.  Past Medical History:  Diagnosis Date  . Anemia    history  . Anxiety   . Atrial fibrillation   . Chronic back pain   . COPD (chronic obstructive pulmonary disease) ([place])   . Coronary atherosclerosis of native coronary artery    DES x 2 to RCA 10/10  . 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 Essential hypertension, benign   . GERD (gastroesophageal reflux disease)   . H/O hiatal hernia   . Headache(784.0)   . HOH (hard of hearing)   . Hyperlipidemia   . Neuromuscular disorder ([place])    Tremors  . Pericardial effusion    Hemorrhagic   . Presence of permanent cardiac pacemaker   . Tachycardia-bradycardia syndrome ([place])    s/p Medtronic Adapta L dual chamber device  5/10    Past Surgical History:  Procedure Laterality Date  . APPENDECTOMY    . CARDIAC CATHETERIZATION  2010   stents x2.  Marland Kitchen CATARACT EXTRACTION    . CHOLECYSTECTOMY    . COLONOSCOPY  2011  .  COLONOSCOPY N/A 10/19/2014   Procedure: COLONOSCOPY;  Surgeon: Rogene Houston, MD;  [facility];  Service: Endoscopy;  Laterality: N/A;  1030  . ESOPHAGOGASTRODUODENOSCOPY N/A 10/26/2014   Procedure: ESOPHAGOGASTRODUODENOSCOPY (EGD);  Surgeon: Rogene Houston, MD;  [facility];  Service: Endoscopy;  Laterality: N/A;  230 - Dr. has lunch and learn  . Esophagogastroduodenoscopy with esophageal dilation  2004, 2006, 2007  . GIVENS CAPSULE STUDY N/A 10/31/2014   Procedure: GIVENS CAPSULE STUDY;  Surgeon: Rogene Houston, MD;  [facility];  Service: Endoscopy;  Laterality: N/A;  730 -- pacemaker--needs monitoring--outpatient bed  . INSERT / REPLACE / REMOVE PACEMAKER  2010  . LEFT HEART CATHETERIZATION WITH CORONARY ANGIOGRAM N/A 11/17/2011   Procedure: LEFT HEART CATHETERIZATION WITH CORONARY ANGIOGRAM;  Surgeon: Sherren Mocha, MD;  Location: [facility] CATH LAB;  Service: Cardiovascular;  Laterality: N/A;  . LEFT HEART CATHETERIZATION WITH CORONARY ANGIOGRAM N/A 09/26/2014   Procedure: LEFT HEART CATHETERIZATION WITH CORONARY ANGIOGRAM;  Surgeon: Leonie Man, MD;  Location: [facility] CATH LAB;  Service: Cardiovascular;  Laterality: N/A;  . Right rotator cuff repair    . SHOULDER ACROMIOPLASTY Right 05/30/2015   Procedure: RIGHT SHOULDER ACROMIOPLASTY;  Surgeon: Carole Civil, MD;  Location: [facility] ORS;  Service: Orthopedics;  Laterality: Right;  . SHOULDER OPEN ROTATOR CUFF REPAIR Right 05/30/2015  Procedure: OPEN ROTATOR CUFF REPAIR RIGHT SHOULDER;  Surgeon: Carole Civil, MD;  Location: [facility] ORS;  Service: Orthopedics;  Laterality: Right;  . SHOULDER OPEN ROTATOR CUFF REPAIR Left 10/22/2016   Procedure: ROTATOR CUFF REPAIR SHOULDER OPEN;  Surgeon: Carole Civil, MD;  Location: [facility] ORS;  Service: Orthopedics;  Laterality: Left;  . Subxiphoid pericardial window  11/10  . VAGINAL HYSTERECTOMY      Current Outpatient Medications  Medication Sig Dispense Refill  .  ALPRAZolam (XANAX) 1 MG tablet Take 1 mg by mouth 2 (two) times daily as needed for anxiety or sleep.     Marland Kitchen amiodarone (PACERONE) 100 MG tablet Take 100 mg by mouth daily.    Marland Kitchen amLODipine (NORVASC) 10 MG tablet Take 1 tablet (10 mg total) by mouth daily. 90 tablet 3  . aspirin 81 MG tablet Take 81 mg by mouth daily.    . clopidogrel (PLAVIX) 75 MG tablet Take 75 mg by mouth daily.     . Cyanocobalamin (B-12 PO) Take 1 tablet by mouth daily.     Marland Kitchen esomeprazole (NEXIUM) 40 MG capsule Take 40 mg by mouth 2 (two) times daily as needed (acid reflux).     . folic acid (FOLVITE) 185 MCG tablet Take 800 mcg by mouth daily.    . furosemide (LASIX) 40 MG tablet Take 60 mg by mouth every evening.     . irbesartan (AVAPRO) 150 MG tablet Take 1 tablet (150 mg total) by mouth daily. 90 tablet 3  . irbesartan (AVAPRO) 150 MG tablet TAKE (1) TABLET BY MOUTH ONCE DAILY. 90 tablet 0  . metoprolol succinate (TOPROL-XL) 25 MG 24 hr tablet TAKE 2 TABLETS BY MOUTH 2 TIMES DAILY. 120 tablet 11  . nitroGLYCERIN (NITROSTAT) 0.4 MG SL tablet Place 0.4 mg under the tongue every 5 (five) minutes as needed for chest pain.     . Pediatric Multivitamins-Iron (FLINTSTONES PLUS IRON) chewable tablet Chew 1 tablet by mouth daily.    . potassium chloride SA (K-DUR,KLOR-CON) 20 MEQ tablet Take 20 mEq by mouth daily. Takes 1 tablet daily and a 2nd tablet only if has to take a 2nd dose of Lasi    . primidone (MYSOLINE) 50 MG tablet Take 50 mg by mouth at bedtime.     . simvastatin (ZOCOR) 5 MG tablet Take 10 mg by mouth every evening.     . traMADol (ULTRAM) 50 MG tablet Take 1 tablet (50 mg total) by mouth every 6 (six) hours as needed. 60 tablet 5   No current facility-administered medications for this visit.    Allergies:  Amitriptyline hcl and Sulfonamide derivatives   Social History: The patient  reports that she quit smoking about 28 years ago. Her smoking use included cigarettes. She has a 20.00 pack-year smoking history.  She has never used smokeless tobacco. She reports that she does not drink alcohol or use drugs.   ROS:  Please see the history of present illness. Otherwise, complete review of systems is positive for hearing loss, tremors.  All other systems are reviewed and negative.   Physical Exam: VS:  BP (!) 152/82 (BP Location: Right Arm)   Pulse 86   Ht 5' 5.5" (1.664 m)   Wt 153 lb (69.4 kg)   SpO2 98%   BMI 25.07 kg/m , BMI Body mass index is 25.07 kg/m.  Wt Readings from Last 3 Encounters:  12/01/17 153 lb (69.4 kg)  08/10/17 158 lb (71.7 kg)  05/26/17 163 lb (73.9  kg)    General: Chronically ill-appearing elderly woman, appears comfortable at rest. HEENT: Conjunctiva and lids normal, oropharynx clear. Neck: Supple, no elevated JVP or carotid bruits, no thyromegaly. Lungs: Diminished breath sounds without wheezing, nonlabored breathing at rest. Cardiac: Regular rate and rhythm, no S3, soft systolic murmur. Abdomen: Soft, nontender, bowel sounds present. Extremities: No pitting edema, distal pulses 2+. Skin: Warm and dry. Musculoskeletal: Kyphosis present. Neuropsychiatric: Alert and oriented x3, affect grossly appropriate.  ECG: I personally reviewed the tracing from 05/26/2017 which showed an atrial paced rhythm.  Recent Labwork: 05/25/2017: ALT 20; AST 25; B Natriuretic Peptide 191.0; BUN 9; Creatinine, Ser 0.71; Hemoglobin 13.0; Platelets 199; Potassium 3.6; Sodium 138     Component Value Date/Time   CHOL 239 (H) 09/16/2016 0714   TRIG 189 (H) 09/16/2016 0714   HDL 41 09/16/2016 0714   CHOLHDL 5.8 09/16/2016 0714   VLDL 38 09/16/2016 0714   LDLCALC 160 (H) 09/16/2016 0714    Other Studies Reviewed Today:  Echocardiogram 06/01/2017: Study Conclusions  - Left ventricle: The cavity size was normal. Wall thickness was   normal. Systolic function was vigorous. The estimated ejection   fraction was in the range of 65% to 70%. The study is not   technically sufficient to  allow evaluation of LV diastolic   function. - Aortic valve: There was mild regurgitation. - Mitral valve: Calcified annulus. Mildly thickened leaflets .   There was mild regurgitation. - Left atrium: The atrium was moderately dilated. - Right atrium: The atrium was mildly dilated.  Lexiscan Myoview 08/25/2014: IMPRESSION: 1. Small region of scar with mild peri-infarct ischemia in the inferolateral wall.  2. Normal left ventricular wall motion.  3. Left ventricular ejection fraction 56%  4. Low-risk stress test findings*.  Assessment and Plan:  1.  CAD status post DES to the RCA in 2010.  Coronary anatomy was stable as of 2016 angiography.  We will continue with medical therapy and observation in the absence of progressive angina.  2.  Tachycardia-bradycardia syndrome, Medtronic pacemaker in place and followed by Dr. Lovena Le.  3.  Paroxysmal atrial fibrillation.  She has been maintaining sinus rhythm fairly well on low-dose amiodarone.  She is not anticoagulated with previous history of hemorrhagic pericardial effusion and overall increased bleeding risk.  4.  Essential hypertension.  Continue with current regimen and keep follow-up with Dr. Nevada Crane.  Current medicines were reviewed with the patient today.  Disposition: Follow-up in 6 months.  Signed, Satira Sark, MD, [facility] 12/01/2017 1:22 PM    [facility] at [facility] [address] [place], [place] [address] Phone: [phone]; Fax: [phone]

## 2017-12-01 ENCOUNTER — Encounter: Payer: Self-pay | Admitting: Cardiology

## 2017-12-01 ENCOUNTER — Ambulatory Visit: Payer: Medicare Other | Admitting: Cardiology

## 2017-12-01 VITALS — BP 152/82 | HR 86 | Ht 65.5 in | Wt 153.0 lb

## 2017-12-01 DIAGNOSIS — I25119 Atherosclerotic heart disease of native coronary artery with unspecified angina pectoris: Secondary | ICD-10-CM

## 2017-12-01 DIAGNOSIS — I480 Paroxysmal atrial fibrillation: Secondary | ICD-10-CM | POA: Diagnosis not present

## 2017-12-01 DIAGNOSIS — I495 Sick sinus syndrome: Secondary | ICD-10-CM | POA: Diagnosis not present

## 2017-12-01 DIAGNOSIS — I10 Essential (primary) hypertension: Secondary | ICD-10-CM

## 2017-12-01 NOTE — Patient Instructions (Signed)

## 2017-12-07 DIAGNOSIS — I509 Heart failure, unspecified: Secondary | ICD-10-CM | POA: Diagnosis not present

## 2018-01-02 ENCOUNTER — Other Ambulatory Visit: Payer: Self-pay | Admitting: Cardiology

## 2018-01-05 ENCOUNTER — Telehealth: Payer: Self-pay | Admitting: Internal Medicine

## 2018-01-05 NOTE — Telephone Encounter (Signed)
I reassured patient that her hearing aides have no effect on her pacemaker

## 2018-01-05 NOTE — Telephone Encounter (Signed)
wants to know if her hearing aids will interfere w/ her pacemaker

## 2018-01-07 DIAGNOSIS — I509 Heart failure, unspecified: Secondary | ICD-10-CM | POA: Diagnosis not present

## 2018-01-12 DIAGNOSIS — R0602 Shortness of breath: Secondary | ICD-10-CM | POA: Diagnosis not present

## 2018-01-12 DIAGNOSIS — I480 Paroxysmal atrial fibrillation: Secondary | ICD-10-CM | POA: Diagnosis not present

## 2018-01-12 DIAGNOSIS — M5430 Sciatica, unspecified side: Secondary | ICD-10-CM | POA: Diagnosis not present

## 2018-01-12 DIAGNOSIS — M545 Low back pain: Secondary | ICD-10-CM | POA: Diagnosis not present

## 2018-01-12 DIAGNOSIS — I2510 Atherosclerotic heart disease of native coronary artery without angina pectoris: Secondary | ICD-10-CM | POA: Diagnosis not present

## 2018-02-01 ENCOUNTER — Other Ambulatory Visit: Payer: Self-pay | Admitting: Cardiology

## 2018-02-03 DIAGNOSIS — M545 Low back pain: Secondary | ICD-10-CM | POA: Diagnosis not present

## 2018-02-03 DIAGNOSIS — R0602 Shortness of breath: Secondary | ICD-10-CM | POA: Diagnosis not present

## 2018-02-03 DIAGNOSIS — M75102 Unspecified rotator cuff tear or rupture of left shoulder, not specified as traumatic: Secondary | ICD-10-CM | POA: Diagnosis not present

## 2018-02-03 DIAGNOSIS — I10 Essential (primary) hypertension: Secondary | ICD-10-CM | POA: Diagnosis not present

## 2018-02-03 DIAGNOSIS — M5430 Sciatica, unspecified side: Secondary | ICD-10-CM | POA: Diagnosis not present

## 2018-02-05 DIAGNOSIS — I10 Essential (primary) hypertension: Secondary | ICD-10-CM | POA: Diagnosis not present

## 2018-02-05 DIAGNOSIS — E782 Mixed hyperlipidemia: Secondary | ICD-10-CM | POA: Diagnosis not present

## 2018-02-05 DIAGNOSIS — I480 Paroxysmal atrial fibrillation: Secondary | ICD-10-CM | POA: Diagnosis not present

## 2018-02-05 DIAGNOSIS — M75102 Unspecified rotator cuff tear or rupture of left shoulder, not specified as traumatic: Secondary | ICD-10-CM | POA: Diagnosis not present

## 2018-02-05 DIAGNOSIS — R809 Proteinuria, unspecified: Secondary | ICD-10-CM | POA: Diagnosis not present

## 2018-02-06 DIAGNOSIS — I509 Heart failure, unspecified: Secondary | ICD-10-CM | POA: Diagnosis not present

## 2018-03-09 DIAGNOSIS — I509 Heart failure, unspecified: Secondary | ICD-10-CM | POA: Diagnosis not present

## 2018-03-09 DIAGNOSIS — R0602 Shortness of breath: Secondary | ICD-10-CM | POA: Diagnosis not present

## 2018-03-22 ENCOUNTER — Ambulatory Visit: Payer: Medicare Other | Admitting: Internal Medicine

## 2018-03-22 ENCOUNTER — Encounter: Payer: Self-pay | Admitting: Internal Medicine

## 2018-03-22 VITALS — BP 138/80 | HR 62 | Ht 65.5 in | Wt 153.0 lb

## 2018-03-22 DIAGNOSIS — I480 Paroxysmal atrial fibrillation: Secondary | ICD-10-CM | POA: Diagnosis not present

## 2018-03-22 DIAGNOSIS — I495 Sick sinus syndrome: Secondary | ICD-10-CM

## 2018-03-22 LAB — CUP PACEART INCLINIC DEVICE CHECK
Battery Remaining Longevity: 55 mo
Battery Voltage: 2.78 V
Brady Statistic AP VP Percent: 0 %
Brady Statistic AS VS Percent: 6 %
Date Time Interrogation Session: 20190819131446
Implantable Lead Implant Date: 20100526
Implantable Lead Location: 753860
Implantable Lead Model: 5076
Implantable Pulse Generator Implant Date: 20100526
Lead Channel Impedance Value: 521 Ohm
Lead Channel Pacing Threshold Amplitude: 0.5 V
Lead Channel Pacing Threshold Amplitude: 0.5 V
Lead Channel Pacing Threshold Amplitude: 0.625 V
Lead Channel Pacing Threshold Pulse Width: 0.4 ms
Lead Channel Pacing Threshold Pulse Width: 0.4 ms
Lead Channel Pacing Threshold Pulse Width: 0.4 ms
Lead Channel Setting Pacing Amplitude: 2.5 V
Lead Channel Setting Sensing Sensitivity: 4 mV
MDC IDC LEAD IMPLANT DT: 20100526
MDC IDC LEAD LOCATION: 753859
MDC IDC MSMT BATTERY IMPEDANCE: 1154 Ohm
MDC IDC MSMT LEADCHNL RA IMPEDANCE VALUE: 430 Ohm
MDC IDC MSMT LEADCHNL RA PACING THRESHOLD PULSEWIDTH: 0.4 ms
MDC IDC MSMT LEADCHNL RA SENSING INTR AMPL: 1.4 mV
MDC IDC MSMT LEADCHNL RV PACING THRESHOLD AMPLITUDE: 0.75 V
MDC IDC MSMT LEADCHNL RV SENSING INTR AMPL: 8 mV
MDC IDC SET LEADCHNL RA PACING AMPLITUDE: 2 V
MDC IDC SET LEADCHNL RV PACING PULSEWIDTH: 0.4 ms
MDC IDC STAT BRADY AP VS PERCENT: 94 %
MDC IDC STAT BRADY AS VP PERCENT: 0 %

## 2018-03-22 NOTE — Patient Instructions (Signed)
Medication Instructions:  Your physician recommends that you continue on your current medications as directed. Please refer to the Current Medication list given to you today.   Labwork: NONE   Testing/Procedures: NONE   Follow-Up: Your physician wants you to follow-up in: 1 Year with Dr. Lovena Le.  You will receive a reminder letter in the mail two months in advance. If you don't receive a letter, please call our office to schedule the follow-up appointment.   Any Other Special Instructions Will Be Listed Below (If Applicable).  You may take an extra Lasix for increase Shortness of Breath.    If you need a refill on your cardiac medications before your next appointment, please call your pharmacy. Thank you for choosing [place]!

## 2018-03-22 NOTE — Progress Notes (Signed)
HPI [name] returns today for followup. She is a 82 year old woman with a history of tachybrady syndrome, coronary disease, status post permanent pacemaker insertion. The patient is not thought to be a Coumadin candidate, particularly in light of her need for aspirin and Plavix. She has not fallen. Her tremor has worsened. She c/o dyspnea with exertion. She is not inclined to take more lasix as she c/o cramps with the lasix. Allergies  Allergen Reactions  . Amitriptyline Hcl Other (See Comments)    Caused "jaws to twist and lock"  . Sulfonamide Derivatives Other (See Comments)    UNKNOWN REACTION     Current Outpatient Medications  Medication Sig Dispense Refill  . ALPRAZolam (XANAX) 1 MG tablet Take 1 mg by mouth 2 (two) times daily as needed for anxiety or sleep.     Marland Kitchen amiodarone (PACERONE) 100 MG tablet Take 100 mg by mouth daily.    Marland Kitchen amiodarone (PACERONE) 200 MG tablet TAKE (1/2) TABLET BY MOUTH ONCE DAILY. 45 tablet 0  . amLODipine (NORVASC) 10 MG tablet TAKE (1) TABLET BY MOUTH AT BEDTIME. 90 tablet 3  . aspirin 81 MG tablet Take 81 mg by mouth daily.    . clopidogrel (PLAVIX) 75 MG tablet Take 75 mg by mouth daily.     . Cyanocobalamin (B-12 PO) Take 1 tablet by mouth daily.     Marland Kitchen esomeprazole (NEXIUM) 40 MG capsule Take 40 mg by mouth 2 (two) times daily as needed (acid reflux).     . folic acid (FOLVITE) 476 MCG tablet Take 400 mcg by mouth daily.    . furosemide (LASIX) 40 MG tablet Take 60 mg by mouth every evening.     . irbesartan (AVAPRO) 150 MG tablet Take 1 tablet (150 mg total) by mouth daily. 90 tablet 3  . irbesartan (AVAPRO) 150 MG tablet TAKE (1) TABLET BY MOUTH ONCE DAILY. 90 tablet 0  . metoprolol succinate (TOPROL-XL) 25 MG 24 hr tablet TAKE 2 TABLETS BY MOUTH 2 TIMES DAILY. 120 tablet 11  . nitroGLYCERIN (NITROSTAT) 0.4 MG SL tablet Place 0.4 mg under the tongue every 5 (five) minutes as needed for chest pain.     . Pediatric Multivitamins-Iron  (FLINTSTONES PLUS IRON) chewable tablet Chew 1 tablet by mouth daily.    . potassium chloride SA (K-DUR,KLOR-CON) 20 MEQ tablet Take 20 mEq by mouth daily. Takes 1 tablet daily and a 2nd tablet only if has to take a 2nd dose of Lasi    . primidone (MYSOLINE) 50 MG tablet Take 50 mg by mouth at bedtime.     . simvastatin (ZOCOR) 5 MG tablet Take 10 mg by mouth every evening.     . traMADol (ULTRAM) 50 MG tablet Take 1 tablet (50 mg total) by mouth every 6 (six) hours as needed. 60 tablet 5   No current facility-administered medications for this visit.      Past Medical History:  Diagnosis Date  . Anemia    history  . Anxiety   . Atrial fibrillation   . Chronic back pain   . COPD (chronic obstructive pulmonary disease) ([place])   . Coronary atherosclerosis of native coronary artery    DES x 2 to RCA 10/10  . 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 Essential hypertension, benign   . GERD (gastroesophageal reflux disease)   . H/O hiatal hernia   . Headache(784.0)   . HOH (hard of hearing)   .  Hyperlipidemia   . Neuromuscular disorder ([place])    Tremors  . Pericardial effusion    Hemorrhagic   . Presence of permanent cardiac pacemaker   . Tachycardia-bradycardia syndrome ([place])    s/p Medtronic Adapta L dual chamber device  5/10    ROS:   All systems reviewed and negative except as noted in the HPI.   Past Surgical History:  Procedure Laterality Date  . APPENDECTOMY    . CARDIAC CATHETERIZATION  2010   stents x2.  Marland Kitchen CATARACT EXTRACTION    . CHOLECYSTECTOMY    . COLONOSCOPY  2011  . COLONOSCOPY N/A 10/19/2014   Procedure: COLONOSCOPY;  Surgeon: Rogene Houston, MD;  [facility];  Service: Endoscopy;  Laterality: N/A;  1030  . ESOPHAGOGASTRODUODENOSCOPY N/A 10/26/2014   Procedure: ESOPHAGOGASTRODUODENOSCOPY (EGD);  Surgeon: Rogene Houston, MD;  [facility];  Service: Endoscopy;  Laterality: N/A;  230 - Dr. has lunch and learn  .  Esophagogastroduodenoscopy with esophageal dilation  2004, 2006, 2007  . GIVENS CAPSULE STUDY N/A 10/31/2014   Procedure: GIVENS CAPSULE STUDY;  Surgeon: Rogene Houston, MD;  [facility];  Service: Endoscopy;  Laterality: N/A;  730 -- pacemaker--needs monitoring--outpatient bed  . INSERT / REPLACE / REMOVE PACEMAKER  2010  . LEFT HEART CATHETERIZATION WITH CORONARY ANGIOGRAM N/A 11/17/2011   Procedure: LEFT HEART CATHETERIZATION WITH CORONARY ANGIOGRAM;  Surgeon: Sherren Mocha, MD;  Location: [facility] CATH LAB;  Service: Cardiovascular;  Laterality: N/A;  . LEFT HEART CATHETERIZATION WITH CORONARY ANGIOGRAM N/A 09/26/2014   Procedure: LEFT HEART CATHETERIZATION WITH CORONARY ANGIOGRAM;  Surgeon: Leonie Man, MD;  Location: [facility] CATH LAB;  Service: Cardiovascular;  Laterality: N/A;  . Right rotator cuff repair    . SHOULDER ACROMIOPLASTY Right 05/30/2015   Procedure: RIGHT SHOULDER ACROMIOPLASTY;  Surgeon: Carole Civil, MD;  Location: [facility] ORS;  Service: Orthopedics;  Laterality: Right;  . SHOULDER OPEN ROTATOR CUFF REPAIR Right 05/30/2015   Procedure: OPEN ROTATOR CUFF REPAIR RIGHT SHOULDER;  Surgeon: Carole Civil, MD;  Location: [facility] ORS;  Service: Orthopedics;  Laterality: Right;  . SHOULDER OPEN ROTATOR CUFF REPAIR Left 10/22/2016   Procedure: ROTATOR CUFF REPAIR SHOULDER OPEN;  Surgeon: Carole Civil, MD;  Location: [facility] ORS;  Service: Orthopedics;  Laterality: Left;  . Subxiphoid pericardial window  11/10  . VAGINAL HYSTERECTOMY       Family History  Problem Relation Age of Onset  . Cancer Mother        Colon   . Coronary artery disease Sister   . Coronary artery disease Brother   . Arthritis Unknown   . Lung disease Unknown   . Asthma Unknown      Social History   Socioeconomic History  . Marital status: Widowed    Spouse name: Not on file  . Number of children: Not on file  . Years of education: Not on file  . Highest education level: Not on file    Occupational History  . Occupation: Retired    Fish farm manager: RETIRED  Social Needs  . Financial resource strain: Not on file  . Food insecurity:    Worry: Not on file    Inability: Not on file  . Transportation needs:    Medical: Not on file    Non-medical: Not on file  Tobacco Use  . Smoking status: Former Smoker    Packs/day: 2.00    Years: 10.00    Pack years: 20.00  Types: Cigarettes    Last attempt to quit: 05/23/1989    Years since quitting: 28.8  . Smokeless tobacco: Never Used  Substance and Sexual Activity  . Alcohol use: No    Alcohol/week: 0.0 standard drinks  . Drug use: No  . Sexual activity: Never    Birth control/protection: Surgical  Lifestyle  . Physical activity:    Days per week: Not on file    Minutes per session: Not on file  . Stress: Not on file  Relationships  . Social connections:    Talks on phone: Not on file    Gets together: Not on file    Attends religious service: Not on file    Active member of club or organization: Not on file    Attends meetings of clubs or organizations: Not on file    Relationship status: Not on file  . Intimate partner violence:    Fear of current or ex partner: Not on file    Emotionally abused: Not on file    Physically abused: Not on file    Forced sexual activity: Not on file  Other Topics Concern  . Not on file  Social History Narrative   She lives alone, has adopted daughter.     BP 138/80   Pulse 62   Ht 5' 5.5" (1.664 m)   Wt 153 lb (69.4 kg)   SpO2 97%   BMI 25.07 kg/m   Physical Exam:  Chronically ill appearing NAD HEENT: Unremarkable Neck:  6 cm JVD, no thyromegally Lymphatics:  No adenopathy Back:  No CVA tenderness Lungs:  Clear with no wheezes HEART:  Regular rate rhythm, no murmurs, no rubs, no clicks Abd:  soft, positive bowel sounds, no organomegally, no rebound, no guarding Ext:  2 plus pulses, no edema, no cyanosis, no clubbing Skin:  No rashes no nodules Neuro:  CN II  through XII intact, motor grossly intact  EKG - none  DEVICE  Normal device function.  See PaceArt for details.   Assess/Plan: 1. PM - her [employer] Jude DDD PM is working normally. 2. PAF - she is mostly maintaining NSR on low dose amiodarone. 3. Chronic diastolic heart failure - I encouraged the patient to take more lasix and potassium but she is not inclined. 4. CAD - she denies anginal symptoms although she is fairly sedentary.  Mikle Bosworth.D.

## 2018-03-24 ENCOUNTER — Ambulatory Visit (INDEPENDENT_AMBULATORY_CARE_PROVIDER_SITE_OTHER): Payer: Medicare Other | Admitting: Internal Medicine

## 2018-03-29 DIAGNOSIS — I2510 Atherosclerotic heart disease of native coronary artery without angina pectoris: Secondary | ICD-10-CM | POA: Diagnosis not present

## 2018-03-29 DIAGNOSIS — R0602 Shortness of breath: Secondary | ICD-10-CM | POA: Diagnosis not present

## 2018-04-07 ENCOUNTER — Ambulatory Visit (INDEPENDENT_AMBULATORY_CARE_PROVIDER_SITE_OTHER): Payer: Medicare Other | Admitting: Internal Medicine

## 2018-04-09 DIAGNOSIS — I509 Heart failure, unspecified: Secondary | ICD-10-CM | POA: Diagnosis not present

## 2018-04-29 DIAGNOSIS — I2510 Atherosclerotic heart disease of native coronary artery without angina pectoris: Secondary | ICD-10-CM | POA: Diagnosis not present

## 2018-04-29 DIAGNOSIS — R0602 Shortness of breath: Secondary | ICD-10-CM | POA: Diagnosis not present

## 2018-05-09 DIAGNOSIS — I509 Heart failure, unspecified: Secondary | ICD-10-CM | POA: Diagnosis not present

## 2018-05-10 ENCOUNTER — Ambulatory Visit (INDEPENDENT_AMBULATORY_CARE_PROVIDER_SITE_OTHER): Payer: Medicare Other | Admitting: Internal Medicine

## 2018-05-10 ENCOUNTER — Encounter (INDEPENDENT_AMBULATORY_CARE_PROVIDER_SITE_OTHER): Payer: Self-pay | Admitting: Internal Medicine

## 2018-05-29 DIAGNOSIS — R0602 Shortness of breath: Secondary | ICD-10-CM | POA: Diagnosis not present

## 2018-05-29 DIAGNOSIS — I2510 Atherosclerotic heart disease of native coronary artery without angina pectoris: Secondary | ICD-10-CM | POA: Diagnosis not present

## 2018-05-31 DIAGNOSIS — K224 Dyskinesia of esophagus: Secondary | ICD-10-CM | POA: Diagnosis not present

## 2018-05-31 DIAGNOSIS — I10 Essential (primary) hypertension: Secondary | ICD-10-CM | POA: Diagnosis not present

## 2018-05-31 DIAGNOSIS — I480 Paroxysmal atrial fibrillation: Secondary | ICD-10-CM | POA: Diagnosis not present

## 2018-06-01 ENCOUNTER — Other Ambulatory Visit (HOSPITAL_COMMUNITY)
Admission: RE | Admit: 2018-06-01 | Discharge: 2018-06-01 | Disposition: A | Discharge status: Home / Self Care | Payer: Medicare Other | Source: Ambulatory Visit | Attending: Cardiology | Admitting: Cardiology

## 2018-06-01 ENCOUNTER — Encounter: Payer: Self-pay | Admitting: Cardiology

## 2018-06-01 ENCOUNTER — Ambulatory Visit: Payer: Medicare Other | Admitting: Cardiology

## 2018-06-01 VITALS — BP 180/80 | HR 56 | Ht 65.5 in | Wt 154.0 lb

## 2018-06-01 DIAGNOSIS — I480 Paroxysmal atrial fibrillation: Secondary | ICD-10-CM | POA: Insufficient documentation

## 2018-06-01 DIAGNOSIS — I495 Sick sinus syndrome: Secondary | ICD-10-CM | POA: Diagnosis not present

## 2018-06-01 DIAGNOSIS — I25119 Atherosclerotic heart disease of native coronary artery with unspecified angina pectoris: Secondary | ICD-10-CM

## 2018-06-01 DIAGNOSIS — Z79899 Other long term (current) drug therapy: Secondary | ICD-10-CM | POA: Diagnosis not present

## 2018-06-01 DIAGNOSIS — I10 Essential (primary) hypertension: Secondary | ICD-10-CM | POA: Diagnosis not present

## 2018-06-01 LAB — COMPREHENSIVE METABOLIC PANEL
ALT: 14 U/L (ref 0–44)
ANION GAP: 9 (ref 5–15)
AST: 23 U/L (ref 15–41)
Albumin: 4 g/dL (ref 3.5–5.0)
Alkaline Phosphatase: 80 U/L (ref 38–126)
BILIRUBIN TOTAL: 0.7 mg/dL (ref 0.3–1.2)
BUN: 9 mg/dL (ref 8–23)
CO2: 28 mmol/L (ref 22–32)
Calcium: 9.6 mg/dL (ref 8.9–10.3)
Chloride: 104 mmol/L (ref 98–111)
Creatinine, Ser: 0.74 mg/dL (ref 0.44–1.00)
Glucose, Bld: 109 mg/dL — ABNORMAL HIGH (ref 70–99)
POTASSIUM: 4 mmol/L (ref 3.5–5.1)
Sodium: 141 mmol/L (ref 135–145)
TOTAL PROTEIN: 8.3 g/dL — AB (ref 6.5–8.1)

## 2018-06-01 LAB — TSH: TSH: 1.23 u[IU]/mL (ref 0.350–4.500)

## 2018-06-01 NOTE — Progress Notes (Signed)
Cardiology Office Note  Date: 06/01/2018   ID: YAEKO FAZEKAS, DOB 1935-12-22, MRN [id number]  PCP: Celene Squibb, MD  Primary Cardiologist: Rozann Lesches, MD   Chief Complaint  Patient presents with  . Coronary Artery Disease    History of Present Illness: [name] is an 82 y.o. female last seen in April.  She presents with a friend for a routine follow-up visit.  States that she is doing reasonably well, no change in chronic dyspnea on exertion, perhaps somewhat better since the weather is cool down.  No obvious angina symptoms or palpitations.  She follows with Dr. Lovena Le in the device clinic, Medtronic pacemaker in place.  Interval visit noted in August.  We discussed follow-up lab work on amiodarone.  We also went over her medications which are outlined below.  Blood pressure was elevated when she came in, on my recheck it was down to 142/80.  I personally reviewed her ECG which shows sinus rhythm with nonspecific T wave changes.  Past Medical History:  Diagnosis Date  . Anemia   . Anxiety   . Atrial fibrillation   . Chronic back pain   . COPD (chronic obstructive pulmonary disease) ([place])   . Coronary atherosclerosis of native coronary artery    DES x 2 to RCA 10/10  . 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 Essential hypertension, benign   . GERD (gastroesophageal reflux disease)   . H/O hiatal hernia   . Headache(784.0)   . HOH (hard of hearing)   . Hyperlipidemia   . Neuromuscular disorder ([place])    Tremors  . Pericardial effusion    Hemorrhagic   . Presence of permanent cardiac pacemaker   . Tachycardia-bradycardia syndrome ([place])    s/p Medtronic Adapta L dual chamber device  5/10    Past Surgical History:  Procedure Laterality Date  . APPENDECTOMY    . CARDIAC CATHETERIZATION  2010   stents x2.  Marland Kitchen CATARACT EXTRACTION    . CHOLECYSTECTOMY    . COLONOSCOPY  2011  . COLONOSCOPY N/A 10/19/2014   Procedure: COLONOSCOPY;   Surgeon: Rogene Houston, MD;  [facility];  Service: Endoscopy;  Laterality: N/A;  1030  . ESOPHAGOGASTRODUODENOSCOPY N/A 10/26/2014   Procedure: ESOPHAGOGASTRODUODENOSCOPY (EGD);  Surgeon: Rogene Houston, MD;  Location: [facility];  Service: Endoscopy;  Laterality: N/A;  230 - Dr. has lunch and learn  . Esophagogastroduodenoscopy with esophageal dilation  2004, 2006, 2007  . GIVENS CAPSULE STUDY N/A 10/31/2014   Procedure: GIVENS CAPSULE STUDY;  Surgeon: Rogene Houston, MD;  Location: [facility];  Service: Endoscopy;  Laterality: N/A;  730 -- pacemaker--needs monitoring--outpatient bed  . INSERT / REPLACE / REMOVE PACEMAKER  2010  . LEFT HEART CATHETERIZATION WITH CORONARY ANGIOGRAM N/A 11/17/2011   Procedure: LEFT HEART CATHETERIZATION WITH CORONARY ANGIOGRAM;  Surgeon: Sherren Mocha, MD;  Location: [facility] CATH LAB;  Service: Cardiovascular;  Laterality: N/A;  . LEFT HEART CATHETERIZATION WITH CORONARY ANGIOGRAM N/A 09/26/2014   Procedure: LEFT HEART CATHETERIZATION WITH CORONARY ANGIOGRAM;  Surgeon: Leonie Man, MD;  Location: [facility] CATH LAB;  Service: Cardiovascular;  Laterality: N/A;  . Right rotator cuff repair    . SHOULDER ACROMIOPLASTY Right 05/30/2015   Procedure: RIGHT SHOULDER ACROMIOPLASTY;  Surgeon: Carole Civil, MD;  Location: [facility] ORS;  Service: Orthopedics;  Laterality: Right;  . SHOULDER OPEN ROTATOR CUFF REPAIR Right 05/30/2015   Procedure: OPEN ROTATOR CUFF REPAIR  RIGHT SHOULDER;  Surgeon: Carole Civil, MD;  Location: [facility] ORS;  Service: Orthopedics;  Laterality: Right;  . SHOULDER OPEN ROTATOR CUFF REPAIR Left 10/22/2016   Procedure: ROTATOR CUFF REPAIR SHOULDER OPEN;  Surgeon: Carole Civil, MD;  Location: [facility] ORS;  Service: Orthopedics;  Laterality: Left;  . Subxiphoid pericardial window  11/10  . VAGINAL HYSTERECTOMY      Current Outpatient Medications  Medication Sig Dispense Refill  . ALPRAZolam (XANAX) 1 MG tablet Take 1 mg by mouth 2 (two)  times daily as needed for anxiety or sleep.     Marland Kitchen amiodarone (PACERONE) 100 MG tablet Take 100 mg by mouth daily.    Marland Kitchen amLODipine (NORVASC) 10 MG tablet TAKE (1) TABLET BY MOUTH AT BEDTIME. 90 tablet 3  . aspirin 81 MG tablet Take 81 mg by mouth daily.    . clopidogrel (PLAVIX) 75 MG tablet Take 75 mg by mouth daily.     . Cyanocobalamin (B-12 PO) Take 1 tablet by mouth daily.     Marland Kitchen esomeprazole (NEXIUM) 40 MG capsule Take 40 mg by mouth 2 (two) times daily as needed (acid reflux).     . folic acid (FOLVITE) 094 MCG tablet Take 400 mcg by mouth daily.    . furosemide (LASIX) 40 MG tablet Take 60 mg by mouth every evening.     . irbesartan (AVAPRO) 150 MG tablet Take 1 tablet (150 mg total) by mouth daily. 90 tablet 3  . irbesartan (AVAPRO) 150 MG tablet TAKE (1) TABLET BY MOUTH ONCE DAILY. 90 tablet 0  . metoprolol succinate (TOPROL-XL) 25 MG 24 hr tablet TAKE 2 TABLETS BY MOUTH 2 TIMES DAILY. 120 tablet 11  . nitroGLYCERIN (NITROSTAT) 0.4 MG SL tablet Place 0.4 mg under the tongue every 5 (five) minutes as needed for chest pain.     . Pediatric Multivitamins-Iron (FLINTSTONES PLUS IRON) chewable tablet Chew 1 tablet by mouth daily.    . potassium chloride SA (K-DUR,KLOR-CON) 20 MEQ tablet Take 20 mEq by mouth daily. Takes 1 tablet daily and a 2nd tablet only if has to take a 2nd dose of Lasi    . primidone (MYSOLINE) 50 MG tablet Take 50 mg by mouth at bedtime.     . simvastatin (ZOCOR) 5 MG tablet Take 10 mg by mouth every evening.     . traMADol (ULTRAM) 50 MG tablet Take 1 tablet (50 mg total) by mouth every 6 (six) hours as needed. 60 tablet 5   No current facility-administered medications for this visit.    Allergies:  Amitriptyline hcl and Sulfonamide derivatives   Social History: The patient  reports that she quit smoking about 29 years ago. Her smoking use included cigarettes. She has a 20.00 pack-year smoking history. She has never used smokeless tobacco. She reports that she does  not drink alcohol or use drugs.   ROS:  Please see the history of present illness. Otherwise, complete review of systems is positive for hearing loss, chronic tremor.  All other systems are reviewed and negative.   Physical Exam: VS:  BP (!) 180/80 (BP Location: Right Arm)   Pulse (!) 56   Ht 5' 5.5" (1.664 m)   Wt 154 lb (69.9 kg)   SpO2 96%   BMI 25.24 kg/m , BMI Body mass index is 25.24 kg/m.  Wt Readings from Last 3 Encounters:  06/01/18 154 lb (69.9 kg)  03/22/18 153 lb (69.4 kg)  12/01/17 153 lb (69.4 kg)    General:  Chronically ill-appearing woman, appears comfortable at rest. HEENT: Conjunctiva and lids normal, oropharynx clear. Neck: Supple, no elevated JVP or carotid bruits, no thyromegaly. Lungs: Decreased breath sounds without wheezing, nonlabored breathing at rest. Cardiac: Regular rate and rhythm, no S3 or significant systolic murmur. Abdomen: Soft, nontender, bowel sounds present. Extremities: No pitting edema, distal pulses 2+. Skin: Warm and dry. Musculoskeletal: No kyphosis. Neuropsychiatric: Alert and oriented x3, affect grossly appropriate.  ECG: I personally reviewed the tracing from 05/25/2017 which showed an atrial paced rhythm with borderline low voltage.  Recent Labwork:    Component Value Date/Time   CHOL 239 (H) 09/16/2016 0714   TRIG 189 (H) 09/16/2016 0714   HDL 41 09/16/2016 0714   CHOLHDL 5.8 09/16/2016 0714   VLDL 38 09/16/2016 0714   LDLCALC 160 (H) 09/16/2016 0714  October 2018: Hemoglobin 13.0, platelets 199, potassium 3.6, BUN 9, creatinine 1.71  Other Studies Reviewed Today:  Echocardiogram 06/01/2017: Study Conclusions  - Left ventricle: The cavity size was normal. Wall thickness was   normal. Systolic function was vigorous. The estimated ejection   fraction was in the range of 65% to 70%. The study is not   technically sufficient to allow evaluation of LV diastolic   function. - Aortic valve: There was mild regurgitation. -  Mitral valve: Calcified annulus. Mildly thickened leaflets .   There was mild regurgitation. - Left atrium: The atrium was moderately dilated. - Right atrium: The atrium was mildly dilated.  Chest CTA 05/25/2017: IMPRESSION: 1. No evidence of pulmonary embolus. 2. No acute abnormality seen within the abdomen or pelvis. 3. Mild emphysema and scarring at the lung apices. Minimal left basilar atelectasis noted. 4. Diffuse coronary artery calcifications seen. 5. Scattered aortic atherosclerosis. 6. Mild degenerative change along the lower thoracic and lumbar spine.  Assessment and Plan:  1.  Paroxysmal atrial fibrillation.  She continues to do well on low-dose amiodarone which will be continued.  She is not anticoagulated with prior history of hemorrhagic pericardial effusion and increased bleeding risk at baseline.  She is also on dual antiplatelet therapy.  Follow-up TSH and CMET.  2.  CAD status post DES to the RCA in 2010.  She reports no angina symptoms with stable coronary anatomy as of 2016.  Continue antiplatelet regimen and statin.  3.  Essential hypertension, recheck blood pressure by me today on current regimen was 142/80.  No changes made.  Keep follow-up with Dr. [place] Crane.  4.  Tachycardia-bradycardia syndrome.  Keep follow-up with Dr. Lovena Le, Medtronic pacemaker in place.  Current medicines were reviewed with the patient today.   Orders Placed This Encounter  Procedures  . Comprehensive metabolic panel  . TSH  . EKG 12-Lead    Disposition: Follow-up in 6 months.  Signed, Satira Sark, MD, [facility] 06/01/2018 12:09 PM    [place] [facility] [address] [place], [place] [address] Phone: [phone]; Fax: [phone]

## 2018-06-01 NOTE — Patient Instructions (Signed)
Medication Instructions:  Your physician recommends that you continue on your current medications as directed. Please refer to the Current Medication list given to you today.  If you need a refill on your cardiac medications before your next appointment, please call your pharmacy.   Lab work: Get labs CMET and TSH If you have labs (blood work) drawn today and your tests are completely normal, you will receive your results only by: Marland Kitchen MyChart Message (if you have MyChart) OR . A paper copy in the mail If you have any lab test that is abnormal or we need to change your treatment, we will call you to review the results.  Testing/Procedures: NONE  Follow-Up: At [facility], you and your health needs are our priority.  As part of our continuing mission to provide you with exceptional heart care, we have created designated Provider Care Teams.  These Care Teams include your primary Cardiologist (physician) and Advanced Practice Providers (APPs -  Physician Assistants and Nurse Practitioners) who all work together to provide you with the care you need, when you need it. You will need a follow up appointment in 6 months.  Please call our office 2 months in advance to schedule this appointment.  You may see Rozann Lesches, MD or one of the following Advanced Practice Providers on your designated Care Team:   Bernerd Pho, PA-C [facility]) . Ermalinda Barrios, PA-C ([place])  Any Other Special Instructions Will Be Listed Below (If Applicable).NONE

## 2018-06-02 ENCOUNTER — Other Ambulatory Visit: Payer: Self-pay | Admitting: Cardiology

## 2018-06-09 DIAGNOSIS — I509 Heart failure, unspecified: Secondary | ICD-10-CM | POA: Diagnosis not present

## 2018-06-29 DIAGNOSIS — K219 Gastro-esophageal reflux disease without esophagitis: Secondary | ICD-10-CM | POA: Diagnosis not present

## 2018-06-29 DIAGNOSIS — R0602 Shortness of breath: Secondary | ICD-10-CM | POA: Diagnosis not present

## 2018-06-29 DIAGNOSIS — R809 Proteinuria, unspecified: Secondary | ICD-10-CM | POA: Diagnosis not present

## 2018-06-29 DIAGNOSIS — E782 Mixed hyperlipidemia: Secondary | ICD-10-CM | POA: Diagnosis not present

## 2018-06-29 DIAGNOSIS — I10 Essential (primary) hypertension: Secondary | ICD-10-CM | POA: Diagnosis not present

## 2018-06-29 DIAGNOSIS — I480 Paroxysmal atrial fibrillation: Secondary | ICD-10-CM | POA: Diagnosis not present

## 2018-06-29 DIAGNOSIS — I2510 Atherosclerotic heart disease of native coronary artery without angina pectoris: Secondary | ICD-10-CM | POA: Diagnosis not present

## 2018-07-09 DIAGNOSIS — I509 Heart failure, unspecified: Secondary | ICD-10-CM | POA: Diagnosis not present

## 2018-08-03 ENCOUNTER — Other Ambulatory Visit: Payer: Self-pay | Admitting: Cardiology

## 2018-08-05 ENCOUNTER — Other Ambulatory Visit: Payer: Self-pay | Admitting: Cardiology

## 2018-08-09 DIAGNOSIS — I509 Heart failure, unspecified: Secondary | ICD-10-CM | POA: Diagnosis not present

## 2018-08-16 DIAGNOSIS — E539 Vitamin B deficiency, unspecified: Secondary | ICD-10-CM | POA: Diagnosis not present

## 2018-08-16 DIAGNOSIS — D509 Iron deficiency anemia, unspecified: Secondary | ICD-10-CM | POA: Diagnosis not present

## 2018-08-16 DIAGNOSIS — E782 Mixed hyperlipidemia: Secondary | ICD-10-CM | POA: Diagnosis not present

## 2018-08-16 DIAGNOSIS — I10 Essential (primary) hypertension: Secondary | ICD-10-CM | POA: Diagnosis not present

## 2018-08-24 ENCOUNTER — Other Ambulatory Visit (HOSPITAL_COMMUNITY): Payer: Self-pay | Admitting: Respiratory Therapy

## 2018-08-24 DIAGNOSIS — R0602 Shortness of breath: Secondary | ICD-10-CM

## 2018-08-24 DIAGNOSIS — E782 Mixed hyperlipidemia: Secondary | ICD-10-CM | POA: Diagnosis not present

## 2018-08-24 DIAGNOSIS — R809 Proteinuria, unspecified: Secondary | ICD-10-CM | POA: Diagnosis not present

## 2018-08-24 DIAGNOSIS — I480 Paroxysmal atrial fibrillation: Secondary | ICD-10-CM | POA: Diagnosis not present

## 2018-08-24 DIAGNOSIS — Z950 Presence of cardiac pacemaker: Secondary | ICD-10-CM | POA: Diagnosis not present

## 2018-08-24 DIAGNOSIS — R0600 Dyspnea, unspecified: Secondary | ICD-10-CM

## 2018-08-24 DIAGNOSIS — I10 Essential (primary) hypertension: Secondary | ICD-10-CM | POA: Diagnosis not present

## 2018-08-29 DIAGNOSIS — I2510 Atherosclerotic heart disease of native coronary artery without angina pectoris: Secondary | ICD-10-CM | POA: Diagnosis not present

## 2018-08-29 DIAGNOSIS — R0602 Shortness of breath: Secondary | ICD-10-CM | POA: Diagnosis not present

## 2018-09-22 ENCOUNTER — Ambulatory Visit (HOSPITAL_COMMUNITY)
Admission: RE | Admit: 2018-09-22 | Discharge: 2018-09-22 | Disposition: A | Discharge status: Home / Self Care | Payer: Medicare Other | Source: Ambulatory Visit | Attending: Internal Medicine | Admitting: Internal Medicine

## 2018-09-22 DIAGNOSIS — R0602 Shortness of breath: Secondary | ICD-10-CM | POA: Insufficient documentation

## 2018-09-22 DIAGNOSIS — R0600 Dyspnea, unspecified: Secondary | ICD-10-CM

## 2018-09-22 LAB — PULMONARY FUNCTION TEST
DL/VA % pred: 114 %
DL/VA: 4.6 ml/min/mmHg/L
DLCO UNC % PRED: 46 %
DLCO UNC: 9.09 ml/min/mmHg
FEF 25-75 Post: 0.84 L/sec
FEF 25-75 Pre: 0.66 L/sec
FEF2575-%CHANGE-POST: 26 %
FEF2575-%Pred-Post: 56 %
FEF2575-%Pred-Pre: 44 %
FEV1-%Change-Post: 6 %
FEV1-%Pred-Post: 57 %
FEV1-%Pred-Pre: 53 %
FEV1-Post: 1.2 L
FEV1-Pre: 1.12 L
FEV1FVC-%CHANGE-POST: 21 %
FEV1FVC-%Pred-Pre: 92 %
FEV6-%Change-Post: -10 %
FEV6-%Pred-Post: 54 %
FEV6-%Pred-Pre: 61 %
FEV6-PRE: 1.62 L
FEV6-Post: 1.45 L
FEV6FVC-%Change-Post: 1 %
FEV6FVC-%PRED-PRE: 103 %
FEV6FVC-%Pred-Post: 105 %
FVC-%Change-Post: -12 %
FVC-%PRED-PRE: 59 %
FVC-%Pred-Post: 51 %
FVC-POST: 1.45 L
FVC-Pre: 1.65 L
Post FEV1/FVC ratio: 83 %
Post FEV6/FVC ratio: 100 %
Pre FEV1/FVC ratio: 68 %
Pre FEV6/FVC Ratio: 98 %

## 2018-09-22 MED ORDER — ALBUTEROL SULFATE (2.5 MG/3ML) 0.083% IN NEBU
2.5000 mg | INHALATION_SOLUTION | Freq: Once | RESPIRATORY_TRACT | Status: AC
  Administered 2018-09-22: 2.5 mg via RESPIRATORY_TRACT

## 2018-09-29 DIAGNOSIS — I2510 Atherosclerotic heart disease of native coronary artery without angina pectoris: Secondary | ICD-10-CM | POA: Diagnosis not present

## 2018-09-29 DIAGNOSIS — R0602 Shortness of breath: Secondary | ICD-10-CM | POA: Diagnosis not present

## 2018-10-05 DIAGNOSIS — R0602 Shortness of breath: Secondary | ICD-10-CM | POA: Diagnosis not present

## 2018-10-05 DIAGNOSIS — R0600 Dyspnea, unspecified: Secondary | ICD-10-CM | POA: Diagnosis not present

## 2018-10-05 DIAGNOSIS — I480 Paroxysmal atrial fibrillation: Secondary | ICD-10-CM | POA: Diagnosis not present

## 2018-10-05 DIAGNOSIS — J449 Chronic obstructive pulmonary disease, unspecified: Secondary | ICD-10-CM | POA: Diagnosis not present

## 2018-10-05 DIAGNOSIS — I10 Essential (primary) hypertension: Secondary | ICD-10-CM | POA: Diagnosis not present

## 2018-10-28 DIAGNOSIS — I2510 Atherosclerotic heart disease of native coronary artery without angina pectoris: Secondary | ICD-10-CM | POA: Diagnosis not present

## 2018-10-28 DIAGNOSIS — R0602 Shortness of breath: Secondary | ICD-10-CM | POA: Diagnosis not present

## 2018-11-03 DIAGNOSIS — I10 Essential (primary) hypertension: Secondary | ICD-10-CM | POA: Diagnosis not present

## 2018-11-03 DIAGNOSIS — J449 Chronic obstructive pulmonary disease, unspecified: Secondary | ICD-10-CM | POA: Diagnosis not present

## 2018-11-03 DIAGNOSIS — I480 Paroxysmal atrial fibrillation: Secondary | ICD-10-CM | POA: Diagnosis not present

## 2018-11-03 DIAGNOSIS — Z950 Presence of cardiac pacemaker: Secondary | ICD-10-CM | POA: Diagnosis not present

## 2018-11-03 DIAGNOSIS — J9611 Chronic respiratory failure with hypoxia: Secondary | ICD-10-CM | POA: Diagnosis not present

## 2018-11-05 DIAGNOSIS — J441 Chronic obstructive pulmonary disease with (acute) exacerbation: Secondary | ICD-10-CM | POA: Diagnosis not present

## 2018-11-09 ENCOUNTER — Other Ambulatory Visit: Payer: Self-pay

## 2018-11-09 NOTE — Patient Outreach (Signed)
[facility]) Care Management  11/09/2018  [name] 1935-12-11 [id number]   Medication Adherence call to [name] Compliant Voice message left with a call back number. [name] is showing past due on Rosuvastatin 10 mg under [place].   [facility] Management Direct Dial [phone]  Fax [phone] [email or web address]

## 2018-11-26 ENCOUNTER — Telehealth: Payer: Self-pay | Admitting: Cardiology

## 2018-11-26 NOTE — Telephone Encounter (Signed)
Virtual Visit Pre-Appointment Phone Call  "(Name), I am calling you today to discuss your upcoming appointment. We are currently trying to limit exposure to the virus that causes COVID-19 by seeing patients at home rather than in the office."  1. "What is the BEST phone number to call the day of the visit?" - include this in appointment notes  2. Do you have or have access to (through a family member/friend) a smartphone with video capability that we can use for your visit?" a. If yes - list this number in appt notes as cell (if different from BEST phone #) and list the appointment type as a VIDEO visit in appointment notes b. If no - list the appointment type as a PHONE visit in appointment notes  3. Confirm consent - "In the setting of the current Covid19 crisis, you are scheduled for a (phone or video) visit with your provider on (date) at (time).  Just as we do with many in-office visits, in order for you to participate in this visit, we must obtain consent.  If you'd like, I can send this to your mychart (if signed up) or email for you to review.  Otherwise, I can obtain your verbal consent now.  All virtual visits are billed to your insurance company just like a normal visit would be.  By agreeing to a virtual visit, we'd like you to understand that the technology does not allow for your provider to perform an examination, and thus may limit your provider's ability to fully assess your condition. If your provider identifies any concerns that need to be evaluated in person, we will make arrangements to do so.  Finally, though the technology is pretty good, we cannot assure that it will always work on either your or our end, and in the setting of a video visit, we may have to convert it to a phone-only visit.  In either situation, we cannot ensure that we have a secure connection.  Are you willing to proceed?" STAFF: Did the patient verbally acknowledge consent to telehealth visit? Document  YES/NO here: yes  4. Advise patient to be prepared - "Two hours prior to your appointment, go ahead and check your blood pressure, pulse, oxygen saturation, and your weight (if you have the equipment to check those) and write them all down. When your visit starts, your provider will ask you for this information. If you have an Apple Watch or Kardia device, please plan to have heart rate information ready on the day of your appointment. Please have a pen and paper handy nearby the day of the visit as well."  5. Give patient instructions for MyChart download to smartphone OR Doximity/Doxy.me as below if video visit (depending on what platform provider is using)  6. Inform patient they will receive a phone call 15 minutes prior to their appointment time (may be from unknown caller ID) so they should be prepared to answer    TELEPHONE CALL NOTE  [name] has been deemed a candidate for a follow-up tele-health visit to limit community exposure during the Covid-19 pandemic. I spoke with the patient via phone to ensure availability of phone/video source, confirm preferred email & phone number, and discuss instructions and expectations.  I reminded [name] to be prepared with any vital sign and/or heart rhythm information that could potentially be obtained via home monitoring, at the time of her visit. I reminded [name] to expect a phone call prior to  her visit.  Weston Anna 11/26/2018 8:39 AM   INSTRUCTIONS FOR DOWNLOADING THE MYCHART APP TO SMARTPHONE  - The patient must first make sure to have activated MyChart and know their login information - If Apple, go to [employer] and type in MyChart in the search bar and download the app. If Android, ask patient to go to [employer] and type in [place] in the search bar and download the app. The app is free but as with any other app downloads, their phone may require them to verify saved payment information or Apple/Android  password.  - The patient will need to then log into the app with their MyChart username and password, and select [facility] as their healthcare provider to link the account. When it is time for your visit, go to the MyChart app, find appointments, and click Begin Video Visit. Be sure to Select Allow for your device to access the Microphone and Camera for your visit. You will then be connected, and your provider will be with you shortly.  **If they have any issues connecting, or need assistance please contact MyChart service desk [phone])**  **If using a computer, in order to ensure the best quality for their visit they will need to use either of the following Internet Browsers: [employer], or Google Chrome**  IF USING DOXIMITY or DOXY.ME - The patient will receive a link just prior to their visit by text.     FULL LENGTH CONSENT FOR TELE-HEALTH VISIT   I hereby voluntarily request, consent and authorize [facility] and its employed or contracted physicians, physician assistants, nurse practitioners or other licensed health care professionals (the Practitioner), to provide me with telemedicine health care services (the Services") as deemed necessary by the treating Practitioner. I acknowledge and consent to receive the Services by the Practitioner via telemedicine. I understand that the telemedicine visit will involve communicating with the Practitioner through live audiovisual communication technology and the disclosure of certain medical information by electronic transmission. I acknowledge that I have been given the opportunity to request an in-person assessment or other available alternative prior to the telemedicine visit and am voluntarily participating in the telemedicine visit.  I understand that I have the right to withhold or withdraw my consent to the use of telemedicine in the course of my care at any time, without affecting my right to future care or treatment,  and that the Practitioner or I may terminate the telemedicine visit at any time. I understand that I have the right to inspect all information obtained and/or recorded in the course of the telemedicine visit and may receive copies of available information for a reasonable fee.  I understand that some of the potential risks of receiving the Services via telemedicine include:   Delay or interruption in medical evaluation due to technological equipment failure or disruption;  Information transmitted may not be sufficient (e.g. poor resolution of images) to allow for appropriate medical decision making by the Practitioner; and/or   In rare instances, security protocols could fail, causing a breach of personal health information.  Furthermore, I acknowledge that it is my responsibility to provide information about my medical history, conditions and care that is complete and accurate to the best of my ability. I acknowledge that Practitioner's advice, recommendations, and/or decision may be based on factors not within their control, such as incomplete or inaccurate data provided by me or distortions of diagnostic images or specimens that may result from electronic transmissions. I  understand that the practice of medicine is not an exact science and that Practitioner makes no warranties or guarantees regarding treatment outcomes. I acknowledge that I will receive a copy of this consent concurrently upon execution via email to the email address I last provided but may also request a printed copy by calling the office of [place].    I understand that my insurance will be billed for this visit.   I have read or had this consent read to me.  I understand the contents of this consent, which adequately explains the benefits and risks of the Services being provided via telemedicine.   I have been provided ample opportunity to ask questions regarding this consent and the Services and have had my questions  answered to my satisfaction.  I give my informed consent for the services to be provided through the use of telemedicine in my medical care  By participating in this telemedicine visit I agree to the above.

## 2018-11-28 DIAGNOSIS — R0602 Shortness of breath: Secondary | ICD-10-CM | POA: Diagnosis not present

## 2018-11-28 DIAGNOSIS — I2510 Atherosclerotic heart disease of native coronary artery without angina pectoris: Secondary | ICD-10-CM | POA: Diagnosis not present

## 2018-11-28 NOTE — Progress Notes (Signed)
Virtual Visit via Telephone Note   This visit type was conducted due to national recommendations for restrictions regarding the COVID-19 Pandemic (e.g. social distancing) in an effort to limit this patient's exposure and mitigate transmission in our community.  Due to her co-morbid illnesses, this patient is at least at moderate risk for complications without adequate follow up.  This format is felt to be most appropriate for this patient at this time.  The patient did not have access to video technology/had technical difficulties with video requiring transitioning to audio format only (telephone).  All issues noted in this document were discussed and addressed.  No physical exam could be performed with this format.  Please refer to the patient's chart for her  consent to telehealth for [facility].   Evaluation Performed:  Follow-up visit  Date:  11/29/2018   ID:  [name], DOB [birth year], MRN [id number]  Patient Location: Home Provider Location: Office  PCP:  Celene Squibb, MD  Cardiologist:  Rozann Lesches, MD Electrophysiologist: [name], MD  Chief Complaint:  Cardiac follow-up  History of Present Illness:    [name] is an 83 y.o. female last seen in October 2019.  She did not have video access and we spoke by phone today.  Still reports chronic shortness of breath, no chest pain or palpitations.  She has also been unsteady on her feet.  No major falls.  She sees Dr. Lovena Le in the device clinic, Medtronic pacemaker in place.  Last device interrogation indicated brief atrial fibrillation, longest episode 9 minutes.  She has not been anticoagulated as detailed in previous notes.  We did talk today about the possibility of stopping low-dose amiodarone which she has been on for years.  She had PFTs in February which showed a moderate obstructive defect without bronchodilator improvement.  Also severely reduced DLCO which was normal with volume correction.  I reviewed the  remainder of her cardiac regimen which is stable and outlined below.  The patient does not have symptoms concerning for COVID-19 infection (fever, chills, cough, or new shortness of breath).  She has been staying around her house.  She has a family member help bring her groceries.   Past Medical History:  Diagnosis Date  . Anemia   . Anxiety   . Atrial fibrillation   . Chronic back pain   . COPD (chronic obstructive pulmonary disease) ([place])   . Coronary atherosclerosis of native coronary artery    DES x 2 to RCA 10/10  . 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 Essential hypertension, benign   . GERD (gastroesophageal reflux disease)   . H/O hiatal hernia   . Headache(784.0)   . HOH (hard of hearing)   . Hyperlipidemia   . Neuromuscular disorder ([place])    Tremors  . Pericardial effusion    Hemorrhagic   . Presence of permanent cardiac pacemaker   . Tachycardia-bradycardia syndrome ([place])    s/p Medtronic Adapta L dual chamber device  5/10   Past Surgical History:  Procedure Laterality Date  . APPENDECTOMY    . CARDIAC CATHETERIZATION  2010   stents x2.  Marland Kitchen CATARACT EXTRACTION    . CHOLECYSTECTOMY    . COLONOSCOPY  2011  . COLONOSCOPY N/A 10/19/2014   Procedure: COLONOSCOPY;  Surgeon: Rogene Houston, MD;  Location: [facility] ENDO SU[facility];  Service: Endoscopy;  Laterality: N/A;  1030  . ESOPHAGOGASTRODUODENOSCOPY N/A 10/26/2014   Procedure: ESOPHAGOGASTRODUODENOSCOPY (EGD);  Surgeon:  Rogene Houston, MD;  Location: [facility] ENDO SUITE;  Service: Endoscopy;  Laterality: N/A;  230 - Dr. has lunch and learn  . Esophagogastroduodenoscopy with esophageal dilation  2004, 2006, 2007  . GIVENS CAPSULE STUDY N/A 10/31/2014   Procedure: GIVENS CAPSULE STUDY;  Surgeon: Rogene Houston, MD;  Location: [facility] ENDO SUITE;  Service: Endoscopy;  Laterality: N/A;  730 -- pacemaker--needs monitoring--outpatient bed  . INSERT / REPLACE / REMOVE PACEMAKER  2010  . LEFT HEART CATHETERIZATION WITH  CORONARY ANGIOGRAM N/A 11/17/2011   Procedure: LEFT HEART CATHETERIZATION WITH CORONARY ANGIOGRAM;  Surgeon: Sherren Mocha, MD;  Location: [facility] CATH LAB;  Service: Cardiovascular;  Laterality: N/A;  . LEFT HEART CATHETERIZATION WITH CORONARY ANGIOGRAM N/A 09/26/2014   Procedure: LEFT HEART CATHETERIZATION WITH CORONARY ANGIOGRAM;  Surgeon: Leonie Man, MD;  Location: [facility] CATH LAB;  Service: Cardiovascular;  Laterality: N/A;  . Right rotator cuff repair    . SHOULDER ACROMIOPLASTY Right 05/30/2015   Procedure: RIGHT SHOULDER ACROMIOPLASTY;  Surgeon: Carole Civil, MD;  Location: [facility] ORS;  Service: Orthopedics;  Laterality: Right;  . SHOULDER OPEN ROTATOR CUFF REPAIR Right 05/30/2015   Procedure: OPEN ROTATOR CUFF REPAIR RIGHT SHOULDER;  Surgeon: Carole Civil, MD;  Location: [facility] ORS;  Service: Orthopedics;  Laterality: Right;  . SHOULDER OPEN ROTATOR CUFF REPAIR Left 10/22/2016   Procedure: ROTATOR CUFF REPAIR SHOULDER OPEN;  Surgeon: Carole Civil, MD;  Location: [facility] ORS;  Service: Orthopedics;  Laterality: Left;  . Subxiphoid pericardial window  11/10  . VAGINAL HYSTERECTOMY       Current Meds  Medication Sig  . ALPRAZolam (XANAX) 1 MG tablet Take 1 mg by mouth 2 (two) times daily as needed for anxiety or sleep.   Marland Kitchen amLODipine (NORVASC) 10 MG tablet TAKE (1) TABLET BY MOUTH AT BEDTIME.  Marland Kitchen aspirin 81 MG tablet Take 81 mg by mouth daily.  . clopidogrel (PLAVIX) 75 MG tablet Take 75 mg by mouth daily.   . Cyanocobalamin (B-12 PO) Take 1 tablet by mouth daily.   Marland Kitchen esomeprazole (NEXIUM) 40 MG capsule Take 40 mg by mouth 2 (two) times daily as needed (acid reflux).   . folic acid (FOLVITE) 017 MCG tablet Take 400 mcg by mouth daily.  . furosemide (LASIX) 40 MG tablet Take 60 mg by mouth every evening.   . irbesartan (AVAPRO) 150 MG tablet TAKE (1) TABLET BY MOUTH ONCE DAILY.  . metoprolol succinate (TOPROL-XL) 25 MG 24 hr tablet TAKE 2 TABLETS BY MOUTH 2 TIMES DAILY.  . nitroGLYCERIN  (NITROSTAT) 0.4 MG SL tablet Place 0.4 mg under the tongue every 5 (five) minutes as needed for chest pain.   . Pediatric Multivitamins-Iron (FLINTSTONES PLUS IRON) chewable tablet Chew 1 tablet by mouth daily.  . potassium chloride SA (K-DUR,KLOR-CON) 20 MEQ tablet Take 20 mEq by mouth daily. Takes 1 tablet daily and a 2nd tablet only if has to take a 2nd dose of [place]  . primidone (MYSOLINE) 50 MG tablet Take 50 mg by mouth at bedtime.   Marland Kitchen PROAIR HFA 108 (90 Base) MCG/ACT inhaler Inhale 1 puff into the lungs every 4 (four) hours as needed.   . simvastatin (ZOCOR) 5 MG tablet Take 10 mg by mouth every evening.   . traMADol (ULTRAM) 50 MG tablet Take 1 tablet (50 mg total) by mouth every 6 (six) hours as needed.  . [DISCONTINUED] amiodarone (PACERONE) 100 MG tablet Take 100 mg by mouth daily.     Allergies:  Amitriptyline hcl and Sulfonamide derivatives   Social History   Tobacco Use  . Smoking status: Former Smoker    Packs/day: 2.00    Years: 10.00    Pack years: 20.00    Types: Cigarettes    Last attempt to quit: 05/23/1989    Years since quitting: 29.5  . Smokeless tobacco: Never Used  Substance Use Topics  . Alcohol use: No    Alcohol/week: 0.0 standard drinks  . Drug use: No     Family Hx: The patient's family history includes Arthritis in her unknown relative; Asthma in her unknown relative; Cancer in her mother; Coronary artery disease in her brother and sister; Lung disease in her unknown relative.  ROS:   Please see the history of present illness.    Chronic shortness of breath and hoarseness. All other systems reviewed and are negative.   Prior CV studies:   The following studies were reviewed today:  Echocardiogram 06/01/2017: Study Conclusions  - Left ventricle: The cavity size was normal. Wall thickness was normal. Systolic function was vigorous. The estimated ejection fraction was in the range of 65% to 70%. The study is not technically sufficient  to allow evaluation of LV diastolic function. - Aortic valve: There was mild regurgitation. - Mitral valve: Calcified annulus. Mildly thickened leaflets . There was mild regurgitation. - Left atrium: The atrium was moderately dilated. - Right atrium: The atrium was mildly dilated.  Labs/Other Tests and Data Reviewed:    EKG:  An ECG dated 06/01/2018 was personally reviewed today and demonstrated:  Sinus rhythm with nonspecific T wave changes.  Recent Labs: 06/01/2018: ALT 14; BUN 9; Creatinine, Ser 0.74; Potassium 4.0; Sodium 141; TSH 1.230   Recent Lipid Panel Lab Results  Component Value Date/Time   CHOL 239 (H) 09/16/2016 07:14 AM   TRIG 189 (H) 09/16/2016 07:14 AM   HDL 41 09/16/2016 07:14 AM   CHOLHDL 5.8 09/16/2016 07:14 AM   LDLCALC 160 (H) 09/16/2016 07:14 AM    Wt Readings from Last 3 Encounters:  11/29/18 153 lb (69.4 kg)  06/01/18 154 lb (69.9 kg)  03/22/18 153 lb (69.4 kg)     Objective:    Vital Signs:  Ht 5\' 5"  (1.651 m)   Wt 153 lb (69.4 kg)   BMI 25.46 kg/m    Patient did not have a way to get blood pressure or heart rate checked today.  No change in hoarseness and voice tone by phone.  She is mildly breathless when speaking in full sentences.  No audible wheezing.  ASSESSMENT & PLAN:    1.  Paroxysmal atrial fibrillation.  She is not anticoagulated with increased bleeding risk at baseline and prior hemorrhage pericardial effusion.  She is on aspirin and Plavix otherwise with ischemic heart disease.  We have discussed the situation and plan is to stop amiodarone at this point. Not certain how positively this will impact her longer-term dyspnea, but certainly worth a try.  She has been on this long-term, overall atrial fibrillation burden has been low.  We will try to get her device interrogated in a few months to make sure that this is not escalating and require different antiarrhythmic therapy.  2.  CAD status post DES to the RCA in 2010.  Coronary  anatomy was stable at angiography in 2016.  She does not report any active angina symptoms at this time.  Continue antiplatelet regimen and statin.  3.  Tachycardia-bradycardia syndrome.  She has a Medtronic pacemaker in place  with follow-up by Dr. Lovena Le.  4.  Chronic hypoxic respiratory failure.  She is on oxygen supplementation at home, recent PFTs noted with follow-up by Dr. Nevada Crane.  COVID-19 Education: The signs and symptoms of COVID-19 were discussed with the patient and how to seek care for testing (follow up with PCP or arrange E-visit).  The importance of social distancing was discussed today.  Time:   Today, I have spent 12 minutes with the patient with telehealth technology discussing the above problems.     Medication Adjustments/Labs and Tests Ordered: Current medicines are reviewed at length with the patient today.  Concerns regarding medicines are outlined above.   Tests Ordered: No orders of the defined types were placed in this encounter.   Medication Changes: Amiodarone is being discontinued.  Disposition:  Follow up 2 months for device interrogation, 3 months for clinical visit.  Signed, Rozann Lesches, MD  11/29/2018 2:39 PM    [place]

## 2018-11-29 ENCOUNTER — Telehealth (INDEPENDENT_AMBULATORY_CARE_PROVIDER_SITE_OTHER): Payer: Medicare Other | Admitting: Cardiology

## 2018-11-29 ENCOUNTER — Encounter: Payer: Self-pay | Admitting: Cardiology

## 2018-11-29 VITALS — Ht 65.0 in | Wt 153.0 lb

## 2018-11-29 DIAGNOSIS — Z7189 Other specified counseling: Secondary | ICD-10-CM | POA: Diagnosis not present

## 2018-11-29 DIAGNOSIS — I25119 Atherosclerotic heart disease of native coronary artery with unspecified angina pectoris: Secondary | ICD-10-CM

## 2018-11-29 DIAGNOSIS — Z950 Presence of cardiac pacemaker: Secondary | ICD-10-CM | POA: Diagnosis not present

## 2018-11-29 DIAGNOSIS — I480 Paroxysmal atrial fibrillation: Secondary | ICD-10-CM | POA: Diagnosis not present

## 2018-11-29 DIAGNOSIS — J9611 Chronic respiratory failure with hypoxia: Secondary | ICD-10-CM | POA: Diagnosis not present

## 2018-11-29 DIAGNOSIS — I495 Sick sinus syndrome: Secondary | ICD-10-CM

## 2018-11-29 DIAGNOSIS — I10 Essential (primary) hypertension: Secondary | ICD-10-CM

## 2018-11-29 NOTE — Patient Instructions (Signed)
Medication Instructions: STOP Amiodarone   Labwork: None today  Procedures/Testing: None today   Follow-Up: Pacemaker check in 2 months, follow up with Dr.McDowell in 3 months  Any Additional Special Instructions Will Be Listed Below (If Applicable).     If you need a refill on your cardiac medications before your next appointment, please call your pharmacy.     Thank you for choosing [facility] !

## 2018-12-05 DIAGNOSIS — J441 Chronic obstructive pulmonary disease with (acute) exacerbation: Secondary | ICD-10-CM | POA: Diagnosis not present

## 2018-12-09 DIAGNOSIS — Z0000 Encounter for general adult medical examination without abnormal findings: Secondary | ICD-10-CM | POA: Diagnosis not present

## 2018-12-21 ENCOUNTER — Telehealth: Payer: Self-pay | Admitting: Orthopedic Surgery

## 2018-12-21 NOTE — Telephone Encounter (Signed)
Reached patient; scheduled virtual as noted; aware.

## 2018-12-21 NOTE — Telephone Encounter (Signed)
Virtual visit 

## 2018-12-21 NOTE — Telephone Encounter (Signed)
Patient called to request appointment for recurring low back problem - thinks may need "some type of injection".  Due to Covid-19 protocol, please advise if virtual appointment may be scheduled, or wait until later date for in-person visit. Patient's ph# [phone]

## 2018-12-22 ENCOUNTER — Other Ambulatory Visit: Payer: Self-pay

## 2018-12-22 ENCOUNTER — Encounter: Payer: Self-pay | Admitting: Orthopedic Surgery

## 2018-12-22 ENCOUNTER — Ambulatory Visit (INDEPENDENT_AMBULATORY_CARE_PROVIDER_SITE_OTHER): Payer: Medicare Other | Admitting: Orthopedic Surgery

## 2018-12-22 DIAGNOSIS — M545 Low back pain, unspecified: Secondary | ICD-10-CM

## 2018-12-22 NOTE — Progress Notes (Signed)
Virtual Visit via Telephone Note  The patient is at home At the providers at the office   I connected with Rich Number on 12/22/18 at  3:00 PM EDT by telephone and verified that I am speaking with the correct person using two identifiers.    I discussed the limitations, risks, security and privacy concerns of performing an evaluation and management service by telephone and the availability of in person appointments. I also discussed with the patient that there may be a patient responsible charge related to this service. The patient expressed understanding and agreed to proceed.   I discussed the assessment and treatment plan with the patient. The patient was provided an opportunity to ask questions and all were answered. The patient agreed with the plan and demonstrated an understanding of the instructions.   The patient was advised to call back or seek an in-person evaluation if the symptoms worsen or if the condition fails to improve as anticipated.  I provided 10 minutes of non-face-to-face time during this encounter.  Chief complaint lower back and hip pain  This is an 83 year old female with a history of lower back pain worsening over the last 6 months.  She took some anti-inflammatory medications which did not help she has received injections in her hip with intramuscular injection in the office here with good result.  She complains of bilateral hip pain worse on the left (patient describes the hip without groin or thigh pain most of the pain is actually in the lower back on the left side)  Review of systems history of shortness of breath no chest pain fever or difficulty walking she does describe some numbness and tingling in her legs  Past Medical History:  Diagnosis Date  . Anemia   . Anxiety   . Atrial fibrillation   . Chronic back pain   . COPD (chronic obstructive pulmonary disease) ([place])   . Coronary atherosclerosis of native coronary artery    DES x 2 to RCA 10/10  .  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 Essential hypertension, benign   . GERD (gastroesophageal reflux disease)   . H/O hiatal hernia   . Headache(784.0)   . HOH (hard of hearing)   . Hyperlipidemia   . Neuromuscular disorder ([place])    Tremors  . Pericardial effusion    Hemorrhagic   . Presence of permanent cardiac pacemaker   . Tachycardia-bradycardia syndrome ([place])    s/p Medtronic Adapta L dual chamber device  5/10    Encounter Diagnosis  Name Primary?  . Acute bilateral low back pain without sciatica Yes     Recommend that the patient come in for intramuscular injection right and left hip on Friday at [place], [place]

## 2018-12-24 ENCOUNTER — Telehealth: Payer: Self-pay | Admitting: Cardiology

## 2018-12-24 ENCOUNTER — Other Ambulatory Visit: Payer: Self-pay

## 2018-12-24 ENCOUNTER — Ambulatory Visit (INDEPENDENT_AMBULATORY_CARE_PROVIDER_SITE_OTHER): Payer: Medicare Other | Admitting: Orthopedic Surgery

## 2018-12-24 VITALS — BP 201/107 | HR 77 | Temp 97.4°F | Ht 65.0 in | Wt 154.0 lb

## 2018-12-24 DIAGNOSIS — M545 Low back pain, unspecified: Secondary | ICD-10-CM

## 2018-12-24 MED ORDER — METHYLPREDNISOLONE ACETATE 80 MG/ML IJ SUSP
80.0000 mg | Freq: Once | INTRAMUSCULAR | Status: AC
  Administered 2018-12-24: 80 mg via INTRAMUSCULAR

## 2018-12-24 NOTE — Telephone Encounter (Signed)
Returned pt phone call. She states that for the past week or more it feels like her heart is out of rhythm. She feels that her bp is elevated as today's reading is 200/107 but unable to tell pulse rate. She feels that she needs to be back on her amiodarone. She has a telehealth visit on 5/27 with Dr. Lovena Le but was wanting to know what to do before the weekend and holiday. She stated that the past several nights she has had to lay down because she felt so bad due to it feeling like her heart was jumping around. Please advise.

## 2018-12-24 NOTE — Telephone Encounter (Signed)
Pt thinks her heart is getting out of rhythm and thinks she needs to be back on her amiodarone

## 2018-12-24 NOTE — Progress Notes (Signed)
Chief Complaint  Patient presents with  . Follow-up    Recheck on back.    Requests injections IM both hips for back pain   Intramuscular injection procedure note  The patient has complained of pain in the  Lower back right side   We have decided that the best treatment option to give the patient pain relief is to do an intramuscular injection of Depo-Medrol   The patient gave verbal consent, timeout was taken as appropriate. The nurse injected    40Mg  of Depo-Medrol into the  Hip/gluteal    Intramuscular injection procedure note  The patient has complained of pain in the  Lower back left side   We have decided that the best treatment option to give the patient pain relief is to do an intramuscular injection of Depo-Medrol   The patient gave verbal consent, timeout was taken as appropriate. The nurse injected    40Mg  of Depo-Medrol into the  Hip/gluteal   Encounter Diagnosis  Name Primary?  . Acute bilateral low back pain without sciatica Yes

## 2018-12-24 NOTE — Addendum Note (Signed)
Addended byCandice Camp on: 12/24/2018 12:38 PM   Modules accepted: Orders

## 2018-12-28 DIAGNOSIS — I2510 Atherosclerotic heart disease of native coronary artery without angina pectoris: Secondary | ICD-10-CM | POA: Diagnosis not present

## 2018-12-28 DIAGNOSIS — R0602 Shortness of breath: Secondary | ICD-10-CM | POA: Diagnosis not present

## 2018-12-29 ENCOUNTER — Other Ambulatory Visit: Payer: Self-pay

## 2018-12-29 ENCOUNTER — Telehealth (INDEPENDENT_AMBULATORY_CARE_PROVIDER_SITE_OTHER): Payer: Medicare Other | Admitting: Internal Medicine

## 2018-12-29 DIAGNOSIS — I25119 Atherosclerotic heart disease of native coronary artery with unspecified angina pectoris: Secondary | ICD-10-CM

## 2018-12-29 DIAGNOSIS — I480 Paroxysmal atrial fibrillation: Secondary | ICD-10-CM

## 2018-12-29 NOTE — Progress Notes (Signed)
Electrophysiology TeleHealth Note   Due to national recommendations of social distancing due to COVID 19, an audio/video telehealth visit is felt to be most appropriate for this patient at this time.  See MyChart message from today for the patient's consent to telehealth for [facility].   Date:  12/29/2018   ID:  [name], DOB September 04, 1935, MRN [id number]  Location: patient's home  Provider location: [address], [place]  Evaluation Performed: Follow-up visit  PCP:  Celene Squibb, MD  Cardiologist:  Rozann Lesches, MD  Electrophysiologist:  Dr Lovena Le  Chief Complaint:  "My blood pressure has been high."  History of Present Illness:    [name] is a 83 y.o. female who presents via audio/video conferencing for a telehealth visit today.  She has a h/o persistent atrial fib which had been controlled on amiodarone. She developed evidence of lung toxicity. Her amio was stopped. She feels some palpitations but her breathing appears to be better. She does not do remote PPM interrogations so we do not have a way to know how much atrial fib she is having remotely. Today, she denies symptoms of chest pain,  lower extremity edema, dizziness, presyncope, or syncope.  She has class 2 dyspnea.The patient is otherwise without complaint today.  The patient denies symptoms of fevers, chills, cough, or new SOB worrisome for COVID 19.  Past Medical History:  Diagnosis Date  . Anemia   . Anxiety   . Atrial fibrillation   . Chronic back pain   . COPD (chronic obstructive pulmonary disease) ([place])   . Coronary atherosclerosis of native coronary artery    DES x 2 to RCA 10/10  . 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 Essential hypertension, benign   . GERD (gastroesophageal reflux disease)   . H/O hiatal hernia   . Headache(784.0)   . HOH (hard of hearing)   . Hyperlipidemia   . Neuromuscular disorder ([place])    Tremors  . Pericardial effusion    Hemorrhagic   . Presence of permanent cardiac pacemaker   . Tachycardia-bradycardia syndrome ([place])    s/p Medtronic Adapta L dual chamber device  5/10    Past Surgical History:  Procedure Laterality Date  . APPENDECTOMY    . CARDIAC CATHETERIZATION  2010   stents x2.  Marland Kitchen CATARACT EXTRACTION    . CHOLECYSTECTOMY    . COLONOSCOPY  2011  . COLONOSCOPY N/A 10/19/2014   Procedure: COLONOSCOPY;  Surgeon: Rogene Houston, MD;  [facility];  Service: Endoscopy;  Laterality: N/A;  1030  . ESOPHAGOGASTRODUODENOSCOPY N/A 10/26/2014   Procedure: ESOPHAGOGASTRODUODENOSCOPY (EGD);  Surgeon: Rogene Houston, MD;  Location: [facility] ENDO SUITE;  Service: Endoscopy;  Laterality: N/A;  230 - Dr. has lunch and learn  . Esophagogastroduodenoscopy with esophageal dilation  2004, 2006, 2007  . GIVENS CAPSULE STUDY N/A 10/31/2014   Procedure: GIVENS CAPSULE STUDY;  Surgeon: Rogene Houston, MD;  Location: [facility] ENDO SU[facility];  Service: Endoscopy;  Laterality: N/A;  730 -- pacemaker--needs monitoring--outpatient bed  . INSERT / REPLACE / REMOVE PACEMAKER  2010  . LEFT HEART CATHETERIZATION WITH CORONARY ANGIOGRAM N/A 11/17/2011   Procedure: LEFT HEART CATHETERIZATION WITH CORONARY ANGIOGRAM;  Surgeon: Sherren Mocha, MD;  Location: [facility] CATH LAB;  Service: Cardiovascular;  Laterality: N/A;  . LEFT HEART CATHETERIZATION WITH CORONARY ANGIOGRAM N/A 09/26/2014   Procedure: LEFT HEART CATHETERIZATION WITH CORONARY ANGIOGRAM;  Surgeon: Leonie Man,  MD;  Location: [facility] CATH LAB;  Service: Cardiovascular;  Laterality: N/A;  . Right rotator cuff repair    . SHOULDER ACROMIOPLASTY Right 05/30/2015   Procedure: RIGHT SHOULDER ACROMIOPLASTY;  Surgeon: Carole Civil, MD;  Location: [facility] ORS;  Service: Orthopedics;  Laterality: Right;  . SHOULDER OPEN ROTATOR CUFF REPAIR Right 05/30/2015   Procedure: OPEN ROTATOR CUFF REPAIR RIGHT SHOULDER;  Surgeon: Carole Civil, MD;  Location: [facility] ORS;  Service: Orthopedics;   Laterality: Right;  . SHOULDER OPEN ROTATOR CUFF REPAIR Left 10/22/2016   Procedure: ROTATOR CUFF REPAIR SHOULDER OPEN;  Surgeon: Carole Civil, MD;  Location: [facility] ORS;  Service: Orthopedics;  Laterality: Left;  . Subxiphoid pericardial window  11/10  . VAGINAL HYSTERECTOMY      Current Outpatient Medications  Medication Sig Dispense Refill  . ALPRAZolam (XANAX) 1 MG tablet Take 1 mg by mouth 2 (two) times daily as needed for anxiety or sleep.     . clopidogrel (PLAVIX) 75 MG tablet Take 75 mg by mouth daily.     . Cyanocobalamin (B-12 PO) Take 1 tablet by mouth daily.     Marland Kitchen esomeprazole (NEXIUM) 40 MG capsule Take 40 mg by mouth 2 (two) times daily as needed (acid reflux).     . folic acid (FOLVITE) 564 MCG tablet Take 400 mcg by mouth daily.    . furosemide (LASIX) 40 MG tablet Take 60 mg by mouth every evening.     . irbesartan (AVAPRO) 150 MG tablet TAKE (1) TABLET BY MOUTH ONCE DAILY. 90 tablet 3  . levalbuterol (XOPENEX) 1.25 MG/3ML nebulizer solution     . metoprolol succinate (TOPROL-XL) 25 MG 24 hr tablet TAKE 2 TABLETS BY MOUTH 2 TIMES DAILY. 360 tablet 3  . nitroGLYCERIN (NITROSTAT) 0.4 MG SL tablet Place 0.4 mg under the tongue every 5 (five) minutes as needed for chest pain.     . Pediatric Multivitamins-Iron (FLINTSTONES PLUS IRON) chewable tablet Chew 1 tablet by mouth daily.    . potassium chloride SA (K-DUR,KLOR-CON) 20 MEQ tablet Take 20 mEq by mouth daily. Takes 1 tablet daily and a 2nd tablet only if has to take a 2nd dose of Lasi    . primidone (MYSOLINE) 50 MG tablet Take 50 mg by mouth at bedtime.     Marland Kitchen PROAIR HFA 108 (90 Base) MCG/ACT inhaler Inhale 1 puff into the lungs every 4 (four) hours as needed.     . rosuvastatin (CRESTOR) 10 MG tablet     . traMADol (ULTRAM) 50 MG tablet Take 1 tablet (50 mg total) by mouth every 6 (six) hours as needed. 60 tablet 5  . amLODipine (NORVASC) 10 MG tablet TAKE (1) TABLET BY MOUTH AT BEDTIME. (Patient not taking: Reported on  12/22/2018) 90 tablet 3   No current facility-administered medications for this visit.     Allergies:   Amitriptyline hcl and Sulfonamide derivatives   Social History:  The patient  reports that she quit smoking about 29 years ago. Her smoking use included cigarettes. She has a 20.00 pack-year smoking history. She has never used smokeless tobacco. She reports that she does not drink alcohol or use drugs.   Family History:  The patient's family history includes Arthritis in her unknown relative; Asthma in her unknown relative; Cancer in her mother; Coronary artery disease in her brother and sister; Lung disease in her unknown relative.   ROS:  Please see the history of present illness.   All other systems are personally  reviewed and negative.    Exam:    Vital Signs:  There were no vitals taken for this visit.  Well appearing, alert and conversant, regular work of breathing,  good skin color Eyes- anicteric, neuro- grossly intact, skin- no apparent rash or lesions or cyanosis, mouth- oral mucosa is pink   Labs/Other Tests and Data Reviewed:    Recent Labs: 06/01/2018: ALT 14; BUN 9; Creatinine, Ser 0.74; Potassium 4.0; Sodium 141; TSH 1.230   Wt Readings from Last 3 Encounters:  12/24/18 154 lb (69.9 kg)  11/29/18 153 lb (69.4 kg)  06/01/18 154 lb (69.9 kg)     Other studies personally reviewed: none   ASSESSMENT & PLAN:    1.  Persistent atrial fib - she had maintained NSR on amio but this was stopped a couple of weeks ago. Unclear if she has reverted back to NSR. I will see her in 3 months. We might consider starting dofetilide at that time if she is back in atrial fib. 2. PPM - she is pending an interogation but does not do remote monitoring. 3. Dyspnea - this has improved since the patient stopped amiodarone.  4. COVID 19 screen The patient denies symptoms of COVID 19 at this time.  The importance of social distancing was discussed today.  Follow-up:  3 months with me  Next remote: n/a  Current medicines are reviewed at length with the patient today.   The patient denies concerns regarding her medicines.  The following changes were made today:  none  Labs/ tests ordered today include: none No orders of the defined types were placed in this encounter.    Patient Risk:  after full review of this patients clinical status, I feel that they are at moderate risk at this time.  Today, I have spent 15 minutes with the patient with telehealth technology discussing all of the above.    Signed, [name], MD  12/29/2018 8:46 AM     [facility] [address] [place] [address] [phone] (office) [phone] (fax)

## 2018-12-29 NOTE — Patient Instructions (Signed)
Medication Instructions:  Your physician recommends that you continue on your current medications as directed. Please refer to the Current Medication list given to you today.   Labwork: NONE  Testing/Procedures: NONE  Follow-Up: Your physician recommends that you schedule a follow-up appointment in: 3 Months with Dr. Lovena Le.   Any Other Special Instructions Will Be Listed Below (If Applicable).     If you need a refill on your cardiac medications before your next appointment, please call your pharmacy.  Thank you for choosing [facility]!

## 2018-12-29 NOTE — Telephone Encounter (Signed)
Spoke to patient today. She is feeling better and bp improved.

## 2019-01-05 DIAGNOSIS — J441 Chronic obstructive pulmonary disease with (acute) exacerbation: Secondary | ICD-10-CM | POA: Diagnosis not present

## 2019-01-10 ENCOUNTER — Encounter: Payer: Self-pay | Admitting: Internal Medicine

## 2019-01-10 DIAGNOSIS — I10 Essential (primary) hypertension: Secondary | ICD-10-CM | POA: Diagnosis not present

## 2019-01-10 DIAGNOSIS — D509 Iron deficiency anemia, unspecified: Secondary | ICD-10-CM | POA: Diagnosis not present

## 2019-01-10 DIAGNOSIS — E539 Vitamin B deficiency, unspecified: Secondary | ICD-10-CM | POA: Diagnosis not present

## 2019-01-10 DIAGNOSIS — E782 Mixed hyperlipidemia: Secondary | ICD-10-CM | POA: Diagnosis not present

## 2019-01-20 DIAGNOSIS — E782 Mixed hyperlipidemia: Secondary | ICD-10-CM | POA: Diagnosis not present

## 2019-01-20 DIAGNOSIS — R809 Proteinuria, unspecified: Secondary | ICD-10-CM | POA: Diagnosis not present

## 2019-01-20 DIAGNOSIS — Z950 Presence of cardiac pacemaker: Secondary | ICD-10-CM | POA: Diagnosis not present

## 2019-01-20 DIAGNOSIS — I10 Essential (primary) hypertension: Secondary | ICD-10-CM | POA: Diagnosis not present

## 2019-01-20 DIAGNOSIS — I480 Paroxysmal atrial fibrillation: Secondary | ICD-10-CM | POA: Diagnosis not present

## 2019-01-28 DIAGNOSIS — I2510 Atherosclerotic heart disease of native coronary artery without angina pectoris: Secondary | ICD-10-CM | POA: Diagnosis not present

## 2019-01-28 DIAGNOSIS — R0602 Shortness of breath: Secondary | ICD-10-CM | POA: Diagnosis not present

## 2019-02-04 DIAGNOSIS — J441 Chronic obstructive pulmonary disease with (acute) exacerbation: Secondary | ICD-10-CM | POA: Diagnosis not present

## 2019-02-17 ENCOUNTER — Other Ambulatory Visit: Payer: Self-pay

## 2019-02-17 NOTE — Patient Outreach (Signed)
[facility]) Care Management  02/17/2019  [name] [birth year] [id number]   Medication Adherence call to [name] spoke with patient she is past due on Irbesartan 150 patient does not want to received any call. [name] is showing past due under [place].   [place] Management Direct Dial [phone]  Fax [phone] [email or web address]

## 2019-02-27 DIAGNOSIS — I2510 Atherosclerotic heart disease of native coronary artery without angina pectoris: Secondary | ICD-10-CM | POA: Diagnosis not present

## 2019-02-27 DIAGNOSIS — R0602 Shortness of breath: Secondary | ICD-10-CM | POA: Diagnosis not present

## 2019-02-28 ENCOUNTER — Ambulatory Visit (INDEPENDENT_AMBULATORY_CARE_PROVIDER_SITE_OTHER): Payer: Medicare Other | Admitting: Cardiology

## 2019-02-28 ENCOUNTER — Encounter: Payer: Self-pay | Admitting: Cardiology

## 2019-02-28 ENCOUNTER — Other Ambulatory Visit: Payer: Self-pay

## 2019-02-28 VITALS — BP 168/74 | HR 80 | Temp 97.7°F | Ht 65.0 in | Wt 150.0 lb

## 2019-02-28 DIAGNOSIS — I495 Sick sinus syndrome: Secondary | ICD-10-CM

## 2019-02-28 DIAGNOSIS — I25119 Atherosclerotic heart disease of native coronary artery with unspecified angina pectoris: Secondary | ICD-10-CM | POA: Diagnosis not present

## 2019-02-28 DIAGNOSIS — I480 Paroxysmal atrial fibrillation: Secondary | ICD-10-CM | POA: Diagnosis not present

## 2019-02-28 DIAGNOSIS — I10 Essential (primary) hypertension: Secondary | ICD-10-CM

## 2019-02-28 NOTE — Patient Instructions (Signed)
Medication Instructions: Your physician recommends that you continue on your current medications as directed. Please refer to the Current Medication list given to you today.  Labwork: None today  Procedures/Testing: None today  Follow-Up:  Keep 04/19/19 1 pm apt with Dr.Taylor   4 months with Dr.McDowell     Any Additional Special Instructions Will Be Listed Below (If Applicable).     If you need a refill on your cardiac medications before your next appointment, please call your pharmacy.     Thank you for choosing [facility] !

## 2019-02-28 NOTE — Progress Notes (Signed)
Cardiology Office Note  Date: 02/28/2019   ID: [name], DOB 05/03/1936, MRN [id number]  PCP:  Katrina Squibb, MD  Cardiologist:  Katrina Lesches, MD Electrophysiologist:  Katrina Peru, MD   Chief Complaint  Patient presents with  . Cardiac follow-up    History of Present Illness: [name] is an 83 y.o. female last assessed via telehealth encounter in April.  She presents for a follow-up visit.  Reports no major change in health, dyspnea on exertion has been stable, she does not report any accelerating angina.  Occasionally feels palpitations.  At the last visit we stopped amiodarone.  She has generally had a fairly low atrial fibrillation burden and with chronic shortness of breath and history of chronic lung disease, decision was made to take her off amiodarone.  She is to follow-up with Dr. Lovena [name] in person for her next device check at which time atrial fibrillation burden can be reassessed.  She sees Dr. Lovena [name] in the device clinic, Medtronic pacemaker in place.  She had a telehealth encounter with him in May.  I reviewed her cardiac regimen which is otherwise stable and outlined below.  She states that she had lab work with Dr. [place] [name] back in May and her statin dose was increased.  Past Medical History:  Diagnosis Date  . Anemia   . Anxiety   . Atrial fibrillation   . Chronic back pain   . COPD (chronic obstructive pulmonary disease) ([place])   . Coronary atherosclerosis of native coronary artery    DES x 2 to RCA 10/10  . 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 Essential hypertension, benign   . GERD (gastroesophageal reflux disease)   . H/O hiatal hernia   . Headache(784.0)   . HOH (hard of hearing)   . Hyperlipidemia   . Neuromuscular disorder ([place])    Tremors  . Pericardial effusion    Hemorrhagic   . Presence of permanent cardiac pacemaker   . Tachycardia-bradycardia syndrome ([place])    s/p Medtronic Adapta L dual chamber device  5/10     Past Surgical History:  Procedure Laterality Date  . APPENDECTOMY    . CARDIAC CATHETERIZATION  2010   stents x2.  Marland Kitchen CATARACT EXTRACTION    . CHOLECYSTECTOMY    . COLONOSCOPY  2011  . COLONOSCOPY N/A 10/19/2014   Procedure: COLONOSCOPY;  Surgeon: Katrina Houston, MD;  Location[facility];  Service: Endoscopy;  Laterality: N/A;  1030  . ESOPHAGOGASTRODUODENOSCOPY N/A 10/26/2014   Procedure: ESOPHAGOGASTRODUODENOSCOPY (EGD);  Surgeon: Katrina Houston, MD;  [facility];  Service: Endoscopy;  Laterality: N/A;  230 - Dr. has lunch and learn  . Esophagogastroduodenoscopy with esophageal dilation  2004, 2006, 2007  . GIVENS CAPSULE STUDY N/A 10/31/2014   Procedure: GIVENS CAPSULE STUDY;  Surgeon: Katrina Houston, MD;  [facility]: [facility];  Service: Endoscopy;  Laterality: N/A;  730 -- pacemaker--needs monitoring--outpatient bed  . INSERT / REPLACE / REMOVE PACEMAKER  2010  . LEFT HEART CATHETERIZATION WITH CORONARY ANGIOGRAM N/A 11/17/2011   Procedure: LEFT HEART CATHETERIZATION WITH CORONARY ANGIOGRAM;  Surgeon: Katrina Mocha, MD;  Location: [facility] CATH LAB;  Service: Cardiovascular;  Laterality: N/A;  . LEFT HEART CATHETERIZATION WITH CORONARY ANGIOGRAM N/A 09/26/2014   Procedure: LEFT HEART CATHETERIZATION WITH CORONARY ANGIOGRAM;  Surgeon: Katrina Man, MD;  Location: [facility] CATH LAB;  Service: Cardiovascular;  Laterality: N/A;  . Right rotator cuff repair    .  SHOULDER ACROMIOPLASTY Right 05/30/2015   Procedure: RIGHT SHOULDER ACROMIOPLASTY;  Surgeon: Katrina Civil, MD;  Location: [facility] ORS;  Service: Orthopedics;  Laterality: Right;  . SHOULDER OPEN ROTATOR CUFF REPAIR Right 05/30/2015   Procedure: OPEN ROTATOR CUFF REPAIR RIGHT SHOULDER;  Surgeon: Katrina Civil, MD;  Location: [facility] ORS;  Service: Orthopedics;  Laterality: Right;  . SHOULDER OPEN ROTATOR CUFF REPAIR Left 10/22/2016   Procedure: ROTATOR CUFF REPAIR SHOULDER OPEN;  Surgeon: Katrina Civil, MD;   Location: [facility] ORS;  Service: Orthopedics;  Laterality: Left;  . Subxiphoid pericardial window  11/10  . VAGINAL HYSTERECTOMY      Current Outpatient Medications  Medication Sig Dispense Refill  . ALPRAZolam (XANAX) 1 MG tablet Take 1 mg by mouth 2 (two) times daily as needed for anxiety or sleep.     Marland Kitchen amLODipine (NORVASC) 10 MG tablet TAKE (1) TABLET BY MOUTH AT BEDTIME. 90 tablet 3  . clopidogrel (PLAVIX) 75 MG tablet Take 75 mg by mouth daily.     . Cyanocobalamin (B-12 PO) Take 1 tablet by mouth daily.     Marland Kitchen esomeprazole (NEXIUM) 40 MG capsule Take 40 mg by mouth 2 (two) times daily as needed (acid reflux).     . folic acid (FOLVITE) 951 MCG tablet Take 400 mcg by mouth daily.    . furosemide (LASIX) 40 MG tablet Take 60 mg by mouth every evening.     . irbesartan (AVAPRO) 150 MG tablet TAKE (1) TABLET BY MOUTH ONCE DAILY. 90 tablet 3  . levalbuterol (XOPENEX) 1.25 MG/3ML nebulizer solution     . metoprolol succinate (TOPROL-XL) 25 MG 24 hr tablet TAKE 2 TABLETS BY MOUTH 2 TIMES DAILY. 360 tablet 3  . nitroGLYCERIN (NITROSTAT) 0.4 MG SL tablet Place 0.4 mg under the tongue every 5 (five) minutes as needed for chest pain.     . Pediatric Multivitamins-Iron (FLINTSTONES PLUS IRON) chewable tablet Chew 1 tablet by mouth daily.    . potassium chloride SA (K-DUR,KLOR-CON) 20 MEQ tablet Take 20 mEq by mouth daily. Takes 1 tablet daily and a 2nd tablet only if has to take a 2nd dose of Lasi    . primidone (MYSOLINE) 50 MG tablet Take 50 mg by mouth at bedtime.     Marland Kitchen PROAIR HFA 108 (90 Base) MCG/ACT inhaler Inhale 1 puff into the lungs every 4 (four) hours as needed.     . rosuvastatin (CRESTOR) 10 MG tablet     . traMADol (ULTRAM) 50 MG tablet Take 1 tablet (50 mg total) by mouth every 6 (six) hours as needed. 60 tablet 5   No current facility-administered medications for this visit.    Allergies:  Amitriptyline hcl and Sulfonamide derivatives   Social History: The patient  reports that she  quit smoking about 29 years ago. Her smoking use included cigarettes. She has a 20.00 pack-year smoking history. She has never used smokeless tobacco. She reports that she does not drink alcohol or use drugs.   ROS:  Please see the history of present illness. Otherwise, complete review of systems is positive for chronic hearing loss.  All other systems are reviewed and negative.   Physical Exam: VS:  BP (!) 168/74   Pulse 80   Temp 97.7 F (36.5 C)   Ht 5\' 5"  (1.651 m)   Wt 150 lb (68 kg)   BMI 24.96 kg/m , BMI Body mass index is 24.96 kg/m.  Wt Readings from Last 3 Encounters:  02/28/19 150  lb (68 kg)  12/24/18 154 lb (69.9 kg)  11/29/18 153 lb (69.4 kg)    General: Elderly woman, no distress. HEENT: Conjunctiva and lids normal, wearing a mask. Neck: Supple, no elevated JVP or carotid bruits, no thyromegaly. Lungs: Decreased breath sounds without wheezing, nonlabored breathing at rest. Cardiac: Regular rate and rhythm, no S3 or significant systolic murmur. Abdomen: Soft, nontender, bowel sounds present. Extremities: No pitting edema, distal pulses 2+. Skin: Warm and dry. Musculoskeletal: No kyphosis. Neuropsychiatric: Alert and oriented x3, resting tremor as before, hearing loss evident.  ECG:  An ECG dated 06/01/2018 was personally reviewed today and demonstrated:  Sinus rhythm with nonspecific T wave changes.  Recent Labwork: 06/01/2018: ALT 14; AST 23; BUN 9; Creatinine, Ser 0.74; Potassium 4.0; Sodium 141; TSH 1.230     Component Value Date/Time   CHOL 239 (H) 09/16/2016 0714   TRIG 189 (H) 09/16/2016 0714   HDL 41 09/16/2016 0714   CHOLHDL 5.8 09/16/2016 0714   VLDL 38 09/16/2016 0714   LDLCALC 160 (H) 09/16/2016 0714    Other Studies Reviewed Today:  Echocardiogram 06/01/2017: Study Conclusions  - Left ventricle: The cavity size was normal. Wall thickness was normal. Systolic function was vigorous. The estimated ejection fraction was in the range of 65%  to 70%. The study is not technically sufficient to allow evaluation of LV diastolic function. - Aortic valve: There was mild regurgitation. - Mitral valve: Calcified annulus. Mildly thickened leaflets . There was mild regurgitation. - Left atrium: The atrium was moderately dilated. - Right atrium: The atrium was mildly dilated.  Assessment and Plan:  1.  Paroxysmal to persistent atrial fibrillation.  She is not anticoagulated with previous history of hemorrhagic pericardial effusion and increased breathing bleeding risk overall.  She maintains dual antiplatelet therapy in the setting of ischemic heart disease.  We stopped amiodarone at the last encounter and she otherwise remains on Toprol-XL.  Heart rate is regular today.  She will have a device interrogation with Dr. Lovena [name] in September at which point AF burden can be further assessed.  2.  CAD status post DES to the RCA in 2010.  We continue with medical therapy, coronary anatomy was stable as of 2016.  3.  Tachycardia-bradycardia syndrome.  She has a Medtronic pacemaker in place with follow-up by Dr. Lovena [name].  4.  Mixed hyperlipidemia, on Crestor with follow-up by Dr. [place] [name].  Medication Adjustments/Labs and Tests Ordered: Current medicines are reviewed at length with the patient today.  Concerns regarding medicines are outlined above.   Tests Ordered: No orders of the defined types were placed in this encounter.   Medication Changes: No orders of the defined types were placed in this encounter.   Disposition:  Follow up 4 months in the [place] office.  Signed, Satira Sark, MD, [facility] 02/28/2019 2:32 PM    [facility] at [facility] [address], [place], [place] [address] Phone: [phone]; Fax: [phone]

## 2019-03-04 ENCOUNTER — Ambulatory Visit: Payer: Medicare Other | Admitting: Cardiology

## 2019-03-07 DIAGNOSIS — J441 Chronic obstructive pulmonary disease with (acute) exacerbation: Secondary | ICD-10-CM | POA: Diagnosis not present

## 2019-03-30 DIAGNOSIS — R0602 Shortness of breath: Secondary | ICD-10-CM | POA: Diagnosis not present

## 2019-03-30 DIAGNOSIS — I2510 Atherosclerotic heart disease of native coronary artery without angina pectoris: Secondary | ICD-10-CM | POA: Diagnosis not present

## 2019-04-07 DIAGNOSIS — J441 Chronic obstructive pulmonary disease with (acute) exacerbation: Secondary | ICD-10-CM | POA: Diagnosis not present

## 2019-04-19 ENCOUNTER — Encounter: Payer: Self-pay | Admitting: Internal Medicine

## 2019-04-19 ENCOUNTER — Other Ambulatory Visit: Payer: Self-pay

## 2019-04-19 ENCOUNTER — Ambulatory Visit (INDEPENDENT_AMBULATORY_CARE_PROVIDER_SITE_OTHER): Payer: Medicare Other | Admitting: Internal Medicine

## 2019-04-19 VITALS — BP 166/84 | HR 84 | Temp 96.9°F | Ht 65.0 in | Wt 150.0 lb

## 2019-04-19 DIAGNOSIS — R809 Proteinuria, unspecified: Secondary | ICD-10-CM | POA: Diagnosis not present

## 2019-04-19 DIAGNOSIS — I10 Essential (primary) hypertension: Secondary | ICD-10-CM | POA: Diagnosis not present

## 2019-04-19 DIAGNOSIS — E782 Mixed hyperlipidemia: Secondary | ICD-10-CM | POA: Diagnosis not present

## 2019-04-19 DIAGNOSIS — I495 Sick sinus syndrome: Secondary | ICD-10-CM | POA: Diagnosis not present

## 2019-04-19 DIAGNOSIS — Z950 Presence of cardiac pacemaker: Secondary | ICD-10-CM

## 2019-04-19 DIAGNOSIS — I480 Paroxysmal atrial fibrillation: Secondary | ICD-10-CM | POA: Diagnosis not present

## 2019-04-19 NOTE — Progress Notes (Signed)
HPI [name] returns today for followup of her persistent atrial fib on amiodarone, sinus node dysfunction s/p PPM insertion. She has class 2 dyspnea which is multifactorial. She has a h/o CAD, s/p PCI in 2010. In the interim, she has lost her brother. She c/o dyspnea. No palpitations.  Allergies  Allergen Reactions  . Amitriptyline Hcl Other (See Comments)    Caused "jaws to twist and lock"  . Sulfonamide Derivatives Other (See Comments)    UNKNOWN REACTION     Current Outpatient Medications  Medication Sig Dispense Refill  . ALPRAZolam (XANAX) 1 MG tablet Take 1 mg by mouth 2 (two) times daily as needed for anxiety or sleep.     Marland Kitchen amLODipine (NORVASC) 10 MG tablet TAKE (1) TABLET BY MOUTH AT BEDTIME. 90 tablet 3  . clopidogrel (PLAVIX) 75 MG tablet Take 75 mg by mouth daily.     . Cyanocobalamin (B-12 PO) Take 1 tablet by mouth daily.     Marland Kitchen esomeprazole (NEXIUM) 40 MG capsule Take 40 mg by mouth 2 (two) times daily as needed (acid reflux).     . folic acid (FOLVITE) A999333 MCG tablet Take 400 mcg by mouth daily.    . furosemide (LASIX) 40 MG tablet Take 60 mg by mouth every evening.     . irbesartan (AVAPRO) 150 MG tablet TAKE (1) TABLET BY MOUTH ONCE DAILY. 90 tablet 3  . levalbuterol (XOPENEX) 1.25 MG/3ML nebulizer solution     . metoprolol succinate (TOPROL-XL) 25 MG 24 hr tablet TAKE 2 TABLETS BY MOUTH 2 TIMES DAILY. 360 tablet 3  . nitroGLYCERIN (NITROSTAT) 0.4 MG SL tablet Place 0.4 mg under the tongue every 5 (five) minutes as needed for chest pain.     . Pediatric Multivitamins-Iron (FLINTSTONES PLUS IRON) chewable tablet Chew 1 tablet by mouth daily.    . potassium chloride SA (K-DUR,KLOR-CON) 20 MEQ tablet Take 20 mEq by mouth daily. Takes 1 tablet daily and a 2nd tablet only if has to take a 2nd dose of Lasi    . primidone (MYSOLINE) 50 MG tablet Take 50 mg by mouth at bedtime.     Marland Kitchen PROAIR HFA 108 (90 Base) MCG/ACT inhaler Inhale 1 puff into the lungs every 4 (four)  hours as needed.     . rosuvastatin (CRESTOR) 10 MG tablet     . traMADol (ULTRAM) 50 MG tablet Take 1 tablet (50 mg total) by mouth every 6 (six) hours as needed. 60 tablet 5   No current facility-administered medications for this visit.      Past Medical History:  Diagnosis Date  . Anemia   . Anxiety   . Atrial fibrillation   . Chronic back pain   . COPD (chronic obstructive pulmonary disease) ([place])   . Coronary atherosclerosis of native coronary artery    DES x 2 to RCA 10/10  . 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 Essential hypertension, benign   . GERD (gastroesophageal reflux disease)   . H/O hiatal hernia   . Headache(784.0)   . HOH (hard of hearing)   . Hyperlipidemia   . Neuromuscular disorder ([place])    Tremors  . Pericardial effusion    Hemorrhagic   . Presence of permanent cardiac pacemaker   . Tachycardia-bradycardia syndrome ([place])    s/p Medtronic Adapta L dual chamber device  5/10    ROS:   All systems reviewed and negative except as noted in the  HPI.   Past Surgical History:  Procedure Laterality Date  . APPENDECTOMY    . CARDIAC CATHETERIZATION  2010   stents x2.  Marland Kitchen CATARACT EXTRACTION    . CHOLECYSTECTOMY    . COLONOSCOPY  2011  . COLONOSCOPY N/A 10/19/2014   Procedure: COLONOSCOPY;  Surgeon: Rogene Houston, MD;  [facility];  Service: Endoscopy;  Laterality: N/A;  1030  . ESOPHAGOGASTRODUODENOSCOPY N/A 10/26/2014   Procedure: ESOPHAGOGASTRODUODENOSCOPY (EGD);  Surgeon: Rogene Houston, MD;  [facility];  Service: Endoscopy;  Laterality: N/A;  230 - Dr. has lunch and learn  . Esophagogastroduodenoscopy with esophageal dilation  2004, 2006, 2007  . GIVENS CAPSULE STUDY N/A 10/31/2014   Procedure: GIVENS CAPSULE STUDY;  Surgeon: Rogene Houston, MD;  [facility];  Service: Endoscopy;  Laterality: N/A;  730 -- pacemaker--needs monitoring--outpatient bed  . INSERT / REPLACE / REMOVE PACEMAKER   2010  . LEFT HEART CATHETERIZATION WITH CORONARY ANGIOGRAM N/A 11/17/2011   Procedure: LEFT HEART CATHETERIZATION WITH CORONARY ANGIOGRAM;  Surgeon: Sherren Mocha, MD;  Location: [facility] CATH LAB;  Service: Cardiovascular;  Laterality: N/A;  . LEFT HEART CATHETERIZATION WITH CORONARY ANGIOGRAM N/A 09/26/2014   Procedure: LEFT HEART CATHETERIZATION WITH CORONARY ANGIOGRAM;  Surgeon: Leonie Man, MD;  Location: [facility] CATH LAB;  Service: Cardiovascular;  Laterality: N/A;  . Right rotator cuff repair    . SHOULDER ACROMIOPLASTY Right 05/30/2015   Procedure: RIGHT SHOULDER ACROMIOPLASTY;  Surgeon: Carole Civil, MD;  Location: [facility] ORS;  Service: Orthopedics;  Laterality: Right;  . SHOULDER OPEN ROTATOR CUFF REPAIR Right 05/30/2015   Procedure: OPEN ROTATOR CUFF REPAIR RIGHT SHOULDER;  Surgeon: Carole Civil, MD;  Location: [facility] ORS;  Service: Orthopedics;  Laterality: Right;  . SHOULDER OPEN ROTATOR CUFF REPAIR Left 10/22/2016   Procedure: ROTATOR CUFF REPAIR SHOULDER OPEN;  Surgeon: Carole Civil, MD;  Location: [facility] ORS;  Service: Orthopedics;  Laterality: Left;  . Subxiphoid pericardial window  11/10  . VAGINAL HYSTERECTOMY       Family History  Problem Relation Age of Onset  . Cancer Mother        Colon   . Coronary artery disease Sister   . Coronary artery disease Brother   . Arthritis Other   . Lung disease Other   . Asthma Other      Social History   Socioeconomic History  . Marital status: Widowed    Spouse name: Not on file  . Number of children: Not on file  . Years of education: Not on file  . Highest education level: Not on file  Occupational History  . Occupation: Retired    Fish farm manager: RETIRED  Social Needs  . Financial resource strain: Not on file  . Food insecurity    Worry: Not on file    Inability: Not on file  . Transportation needs    Medical: Not on file    Non-medical: Not on file  Tobacco Use  . Smoking status: Former Smoker    Packs/day: 2.00     Years: 10.00    Pack years: 20.00    Types: Cigarettes    Quit date: 05/23/1989    Years since quitting: 29.9  . Smokeless tobacco: Never Used  Substance and Sexual Activity  . Alcohol use: No    Alcohol/week: 0.0 standard drinks  . Drug use: No  . Sexual activity: Never    Birth control/protection: Surgical  Lifestyle  . Physical activity  Days per week: Not on file    Minutes per session: Not on file  . Stress: Not on file  Relationships  . Social Herbalist on phone: Not on file    Gets together: Not on file    Attends religious service: Not on file    Active member of club or organization: Not on file    Attends meetings of clubs or organizations: Not on file    Relationship status: Not on file  . Intimate partner violence    Fear of current or ex partner: Not on file    Emotionally abused: Not on file    Physically abused: Not on file    Forced sexual activity: Not on file  Other Topics Concern  . Not on file  Social History Narrative   She lives alone, has adopted daughter.     BP (!) 166/84   Pulse 84   Temp (!) 96.9 F (36.1 C) (Temporal)   Ht 5\' 5"  (1.651 m)   Wt 150 lb (68 kg)   SpO2 96%   BMI 24.96 kg/m   Physical Exam:  Well appearing NAD HEENT: Unremarkable Neck:  No JVD, no thyromegally Lymphatics:  No adenopathy Back:  No CVA tenderness Lungs:  Clear with no wheezes HEART:  Regular rate rhythm, no murmurs, no rubs, no clicks Abd:  soft, positive bowel sounds, no organomegally, no rebound, no guarding Ext:  2 plus pulses, no edema, no cyanosis, no clubbing Skin:  No rashes no nodules Neuro:  CN II through XII intact, motor grossly intact   DEVICE  Normal device function.  See PaceArt for details.   Assess/Plan: 1. Sinus node dysfunction - she is asymptomatic, s/p PPM insertion. 2. PPM - her medtronic DDD PM is working normally. We will recheck in several months. 3. PAF - she is maintaining NSR. She will continue her current  meds. I will hold off on dofetilide for now.   Mikle Bosworth.D.

## 2019-04-19 NOTE — Patient Instructions (Signed)
Medication Instructions: Your physician recommends that you continue on your current medications as directed. Please refer to the Current Medication list given to you today.   Labwork: None today  Procedures/Testing: None today  Follow-Up: 1 year with Dr.Taylor  Any Additional Special Instructions Will Be Listed Below (If Applicable).     If you need a refill on your cardiac medications before your next appointment, please call your pharmacy.   

## 2019-04-30 DIAGNOSIS — R0602 Shortness of breath: Secondary | ICD-10-CM | POA: Diagnosis not present

## 2019-04-30 DIAGNOSIS — I2510 Atherosclerotic heart disease of native coronary artery without angina pectoris: Secondary | ICD-10-CM | POA: Diagnosis not present

## 2019-05-07 DIAGNOSIS — J441 Chronic obstructive pulmonary disease with (acute) exacerbation: Secondary | ICD-10-CM | POA: Diagnosis not present

## 2019-05-10 DIAGNOSIS — I10 Essential (primary) hypertension: Secondary | ICD-10-CM | POA: Diagnosis not present

## 2019-05-10 DIAGNOSIS — E782 Mixed hyperlipidemia: Secondary | ICD-10-CM | POA: Diagnosis not present

## 2019-05-10 DIAGNOSIS — Z950 Presence of cardiac pacemaker: Secondary | ICD-10-CM | POA: Diagnosis not present

## 2019-05-10 DIAGNOSIS — I480 Paroxysmal atrial fibrillation: Secondary | ICD-10-CM | POA: Diagnosis not present

## 2019-05-10 DIAGNOSIS — R809 Proteinuria, unspecified: Secondary | ICD-10-CM | POA: Diagnosis not present

## 2019-05-30 DIAGNOSIS — D509 Iron deficiency anemia, unspecified: Secondary | ICD-10-CM | POA: Diagnosis not present

## 2019-05-30 DIAGNOSIS — I2510 Atherosclerotic heart disease of native coronary artery without angina pectoris: Secondary | ICD-10-CM | POA: Diagnosis not present

## 2019-05-30 DIAGNOSIS — R7301 Impaired fasting glucose: Secondary | ICD-10-CM | POA: Diagnosis not present

## 2019-05-30 DIAGNOSIS — R0602 Shortness of breath: Secondary | ICD-10-CM | POA: Diagnosis not present

## 2019-05-30 DIAGNOSIS — I10 Essential (primary) hypertension: Secondary | ICD-10-CM | POA: Diagnosis not present

## 2019-05-30 DIAGNOSIS — E782 Mixed hyperlipidemia: Secondary | ICD-10-CM | POA: Diagnosis not present

## 2019-06-07 DIAGNOSIS — J441 Chronic obstructive pulmonary disease with (acute) exacerbation: Secondary | ICD-10-CM | POA: Diagnosis not present

## 2019-06-08 ENCOUNTER — Other Ambulatory Visit: Payer: Self-pay

## 2019-06-08 DIAGNOSIS — I480 Paroxysmal atrial fibrillation: Secondary | ICD-10-CM | POA: Diagnosis not present

## 2019-06-08 DIAGNOSIS — R809 Proteinuria, unspecified: Secondary | ICD-10-CM | POA: Diagnosis not present

## 2019-06-08 DIAGNOSIS — E782 Mixed hyperlipidemia: Secondary | ICD-10-CM | POA: Diagnosis not present

## 2019-06-08 DIAGNOSIS — Z950 Presence of cardiac pacemaker: Secondary | ICD-10-CM | POA: Diagnosis not present

## 2019-06-08 DIAGNOSIS — I10 Essential (primary) hypertension: Secondary | ICD-10-CM | POA: Diagnosis not present

## 2019-06-08 NOTE — Patient Outreach (Signed)
[facility]) Care Management  06/08/2019  [name] 1936-07-16 [id number]   Medication Adherence call to [name] Compliant Voice message left with a call back number. [name] is showing past due on Irbesartan 150 mg under [place].   [place] Management Direct Dial [phone]  Fax [phone] [email or web address]

## 2019-06-15 DIAGNOSIS — R809 Proteinuria, unspecified: Secondary | ICD-10-CM | POA: Diagnosis not present

## 2019-06-15 DIAGNOSIS — I10 Essential (primary) hypertension: Secondary | ICD-10-CM | POA: Diagnosis not present

## 2019-06-15 DIAGNOSIS — E782 Mixed hyperlipidemia: Secondary | ICD-10-CM | POA: Diagnosis not present

## 2019-06-15 DIAGNOSIS — I480 Paroxysmal atrial fibrillation: Secondary | ICD-10-CM | POA: Diagnosis not present

## 2019-06-15 DIAGNOSIS — Z950 Presence of cardiac pacemaker: Secondary | ICD-10-CM | POA: Diagnosis not present

## 2019-06-21 DIAGNOSIS — R450 Nervousness: Secondary | ICD-10-CM | POA: Diagnosis not present

## 2019-06-21 DIAGNOSIS — I10 Essential (primary) hypertension: Secondary | ICD-10-CM | POA: Diagnosis not present

## 2019-06-30 DIAGNOSIS — I2510 Atherosclerotic heart disease of native coronary artery without angina pectoris: Secondary | ICD-10-CM | POA: Diagnosis not present

## 2019-06-30 DIAGNOSIS — R0602 Shortness of breath: Secondary | ICD-10-CM | POA: Diagnosis not present

## 2019-07-04 ENCOUNTER — Other Ambulatory Visit: Payer: Self-pay

## 2019-07-04 ENCOUNTER — Encounter: Payer: Self-pay | Admitting: Cardiology

## 2019-07-04 ENCOUNTER — Ambulatory Visit: Payer: Medicare Other | Admitting: Cardiology

## 2019-07-04 VITALS — BP 199/85 | HR 63 | Temp 95.7°F | Ht 65.5 in | Wt 149.0 lb

## 2019-07-04 DIAGNOSIS — I480 Paroxysmal atrial fibrillation: Secondary | ICD-10-CM | POA: Diagnosis not present

## 2019-07-04 DIAGNOSIS — I495 Sick sinus syndrome: Secondary | ICD-10-CM

## 2019-07-04 DIAGNOSIS — I25119 Atherosclerotic heart disease of native coronary artery with unspecified angina pectoris: Secondary | ICD-10-CM

## 2019-07-04 NOTE — Patient Instructions (Signed)

## 2019-07-04 NOTE — Progress Notes (Signed)
Cardiology Office Note  Date: 07/04/2019   ID: Katrina, [name] 1936/06/27, MRN [id number]  PCP:  Celene Squibb, MD  Cardiologist:  Rozann Lesches, MD Electrophysiologist:  [name], MD   Chief Complaint  Patient presents with  . Cardiac follow-up    History of Present Illness: [name] is an 83 y.o. female last seen in July.  She is here today for a routine follow-up visit with care assistant.  She does not report any major change in health, has been staying at home mostly during the pandemic.  She has chronic dyspnea on exertion, uses oxygen intermittently, mainly at nighttime.  She does not report any palpitations or progressive angina symptoms with low-level activities.  She follows with Dr. Lovena Le in the device clinic, Medtronic pacemaker in place.  Follow-up visit noted in September.  I reviewed her lab work from June as outlined below.  We also went over her medications.  Amiodarone was discontinued previously.  Blood pressure significant elevated when she came in today, I rechecked it and found it to be 148/82.  Past Medical History:  Diagnosis Date  . Anemia   . Anxiety   . Atrial fibrillation   . Chronic back pain   . COPD (chronic obstructive pulmonary disease) ([place])   . Coronary atherosclerosis of native coronary artery    DES x 2 to RCA 10/10  . 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 Essential hypertension   . GERD (gastroesophageal reflux disease)   . H/O hiatal hernia   . Headache(784.0)   . HOH (hard of hearing)   . Hyperlipidemia   . Neuromuscular disorder ([place])    Tremors  . Pericardial effusion    Hemorrhagic   . Presence of permanent cardiac pacemaker   . Tachycardia-bradycardia syndrome ([place])    s/p Medtronic Adapta L dual chamber device  5/10    Past Surgical History:  Procedure Laterality Date  . APPENDECTOMY    . CARDIAC CATHETERIZATION  2010   stents x2.  Marland Kitchen CATARACT EXTRACTION    . CHOLECYSTECTOMY     . COLONOSCOPY  2011  . COLONOSCOPY N/A 10/19/2014   Procedure: COLONOSCOPY;  Surgeon: Rogene Houston, MD;  [facility];  Service: Endoscopy;  Laterality: N/A;  1030  . ESOPHAGOGASTRODUODENOSCOPY N/A 10/26/2014   Procedure: ESOPHAGOGASTRODUODENOSCOPY (EGD);  Surgeon: Rogene Houston, MD;  [facility];  Service: Endoscopy;  Laterality: N/A;  230 - Dr. has lunch and learn  . Esophagogastroduodenoscopy with esophageal dilation  2004, 2006, 2007  . GIVENS CAPSULE STUDY N/A 10/31/2014   Procedure: GIVENS CAPSULE STUDY;  Surgeon: Rogene Houston, MD;  [facility];  Service: Endoscopy;  Laterality: N/A;  730 -- pacemaker--needs monitoring--outpatient bed  . INSERT / REPLACE / REMOVE PACEMAKER  2010  . LEFT HEART CATHETERIZATION WITH CORONARY ANGIOGRAM N/A 11/17/2011   Procedure: LEFT HEART CATHETERIZATION WITH CORONARY ANGIOGRAM;  Surgeon: Sherren Mocha, MD;  Location: [facility] CATH LAB;  Service: Cardiovascular;  Laterality: N/A;  . LEFT HEART CATHETERIZATION WITH CORONARY ANGIOGRAM N/A 09/26/2014   Procedure: LEFT HEART CATHETERIZATION WITH CORONARY ANGIOGRAM;  Surgeon: Leonie Man, MD;  Location: [facility] CATH LAB;  Service: Cardiovascular;  Laterality: N/A;  . Right rotator cuff repair    . SHOULDER ACROMIOPLASTY Right 05/30/2015   Procedure: RIGHT SHOULDER ACROMIOPLASTY;  Surgeon: Carole Civil, MD;  Location: [facility] ORS;  Service: Orthopedics;  Laterality: Right;  . SHOULDER OPEN  ROTATOR CUFF REPAIR Right 05/30/2015   Procedure: OPEN ROTATOR CUFF REPAIR RIGHT SHOULDER;  Surgeon: Carole Civil, MD;  Location: [facility] ORS;  Service: Orthopedics;  Laterality: Right;  . SHOULDER OPEN ROTATOR CUFF REPAIR Left 10/22/2016   Procedure: ROTATOR CUFF REPAIR SHOULDER OPEN;  Surgeon: Carole Civil, MD;  Location: [facility] OR[facility];  Service: Orthopedics;  Laterality: Left;  . Subxiphoid pericardial window  11/10  . VAGINAL HYSTERECTOMY      Current Outpatient Medications  Medication  Sig Dispense Refill  . ALPRAZolam (XANAX) 1 MG tablet Take 1 mg by mouth 2 (two) times daily as needed for anxiety or sleep.     Marland Kitchen amLODipine (NORVASC) 10 MG tablet TAKE (1) TABLET BY MOUTH AT BEDTIME. 90 tablet 3  . clopidogrel (PLAVIX) 75 MG tablet Take 75 mg by mouth daily.     . Cyanocobalamin (B-12 PO) Take 1 tablet by mouth daily.     Marland Kitchen esomeprazole (NEXIUM) 40 MG capsule Take 40 mg by mouth 2 (two) times daily as needed (acid reflux).     . folic acid (FOLVITE) A999333 MCG tablet Take 400 mcg by mouth daily.    . furosemide (LASIX) 40 MG tablet Take 60 mg by mouth every evening.     . irbesartan (AVAPRO) 150 MG tablet TAKE (1) TABLET BY MOUTH ONCE DAILY. 90 tablet 3  . levalbuterol (XOPENEX) 1.25 MG/3ML nebulizer solution     . metoprolol succinate (TOPROL-XL) 25 MG 24 hr tablet TAKE 2 TABLETS BY MOUTH 2 TIMES DAILY. 360 tablet 3  . nitroGLYCERIN (NITROSTAT) 0.4 MG SL tablet Place 0.4 mg under the tongue every 5 (five) minutes as needed for chest pain.     . Pediatric Multivitamins-Iron (FLINTSTONES PLUS IRON) chewable tablet Chew 1 tablet by mouth daily.    . potassium chloride SA (K-DUR,KLOR-CON) 20 MEQ tablet Take 20 mEq by mouth daily. Takes 1 tablet daily and a 2nd tablet only if has to take a 2nd dose of Lasi    . primidone (MYSOLINE) 50 MG tablet Take 50 mg by mouth at bedtime.     Marland Kitchen PROAIR HFA 108 (90 Base) MCG/ACT inhaler Inhale 1 puff into the lungs every 4 (four) hours as needed.     . rosuvastatin (CRESTOR) 10 MG tablet     . traMADol (ULTRAM) 50 MG tablet Take 1 tablet (50 mg total) by mouth every 6 (six) hours as needed. 60 tablet 5   No current facility-administered medications for this visit.    Allergies:  Amitriptyline hcl and Sulfonamide derivatives   Social History: The patient  reports that she quit smoking about 30 years ago. Her smoking use included cigarettes. She has a 20.00 pack-year smoking history. She has never used smokeless tobacco. She reports that she does  not drink alcohol or use drugs.   ROS:  Please see the history of present illness. Otherwise, complete review of systems is positive for chronic tremor and hearing loss.  All other systems are reviewed and negative.   Physical Exam: VS:  BP (!) 199/85   Pulse 63   Temp (!) 95.7 F (35.4 C)   Ht 5' 5.5" (1.664 m)   Wt 149 lb (67.6 kg)   SpO2 94%   BMI 24.42 kg/m , BMI Body mass index is 24.42 kg/m.  Wt Readings from Last 3 Encounters:  07/04/19 149 lb (67.6 kg)  04/19/19 150 lb (68 kg)  02/28/19 150 lb (68 kg)    General: Elderly woman, no distress.  HEENT: Conjunctiva and lids normal, wearing a mask. Neck: Supple, no elevated JVP or carotid bruits, no thyromegaly. Lungs: Decreased breath sounds, nonlabored breathing at rest. Cardiac: Regular rate and rhythm, no S3, soft systolic murmur. Abdomen: Soft, nontender, bowel sounds present. Extremities: No pitting edema, distal pulses 2+. Skin: Warm and dry. Musculoskeletal: No kyphosis. Neuropsychiatric: Alert and oriented x3, resting tremor, hearing loss evident.  ECG:  An ECG dated 06/01/2018 was personally reviewed today and demonstrated:  Sinus rhythm with nonspecific ST-T wave changes.  Recent Labwork:  June 2020: Hemoglobin 13.2, platelets 221 112, creatinine 0.62, potassium 4.6, AST 23, ALT 21, cholesterol 251, triglycerides 153, HDL 52, LDL 168  Other Studies Reviewed Today:  Echocardiogram 06/01/2017: Study Conclusions  - Left ventricle: The cavity size was normal. Wall thickness was   normal. Systolic function was vigorous. The estimated ejection   fraction was in the range of 65% to 70%. The study is not   technically sufficient to allow evaluation of LV diastolic   function. - Aortic valve: There was mild regurgitation. - Mitral valve: Calcified annulus. Mildly thickened leaflets .   There was mild regurgitation. - Left atrium: The atrium was moderately dilated. - Right atrium: The atrium was mildly dilated.   Assessment and Plan:  1.  Paroxysmal to persistent atrial fibrillation.  She is not anticoagulated based on previous discussions and history of hemorrhagic pericardial effusion, also perceived bleeding risk.  She does not report any active palpitations, we stopped amiodarone previously.  Continues on Toprol-XL.  2.  CAD status post DES to the RCA in 2010.  Coronary anatomy was stable as of 2016 angiography.  No reported angina symptoms.  She is on Plavix, Norvasc, Toprol-XL, and statin.  3.  Tachycardia-bradycardia syndrome, Medtronic pacemaker in place.  Keep follow-up with Dr. Lovena Le.  4.  Mixed hyperlipidemia on Crestor.  She continues to follow regularly with Dr. Nevada Crane.  Medication Adjustments/Labs and Tests Ordered: Current medicines are reviewed at length with the patient today.  Concerns regarding medicines are outlined above.   Tests Ordered: No orders of the defined types were placed in this encounter.   Medication Changes: No orders of the defined types were placed in this encounter.   Disposition:  Follow up 6 months in the [place] office.  Signed, Satira Sark, MD, [facility] 07/04/2019 2:20 PM    [facility] [address] [place], [place] [address] Phone: [phone]; Fax: [phone]

## 2019-07-07 DIAGNOSIS — J441 Chronic obstructive pulmonary disease with (acute) exacerbation: Secondary | ICD-10-CM | POA: Diagnosis not present

## 2019-07-12 DIAGNOSIS — I10 Essential (primary) hypertension: Secondary | ICD-10-CM | POA: Diagnosis not present

## 2019-07-12 DIAGNOSIS — E782 Mixed hyperlipidemia: Secondary | ICD-10-CM | POA: Diagnosis not present

## 2019-07-12 DIAGNOSIS — R809 Proteinuria, unspecified: Secondary | ICD-10-CM | POA: Diagnosis not present

## 2019-07-12 DIAGNOSIS — I480 Paroxysmal atrial fibrillation: Secondary | ICD-10-CM | POA: Diagnosis not present

## 2019-07-30 DIAGNOSIS — R0602 Shortness of breath: Secondary | ICD-10-CM | POA: Diagnosis not present

## 2019-07-30 DIAGNOSIS — I2510 Atherosclerotic heart disease of native coronary artery without angina pectoris: Secondary | ICD-10-CM | POA: Diagnosis not present

## 2019-08-07 DIAGNOSIS — J441 Chronic obstructive pulmonary disease with (acute) exacerbation: Secondary | ICD-10-CM | POA: Diagnosis not present

## 2019-08-18 DIAGNOSIS — R809 Proteinuria, unspecified: Secondary | ICD-10-CM | POA: Diagnosis not present

## 2019-08-18 DIAGNOSIS — E7849 Other hyperlipidemia: Secondary | ICD-10-CM | POA: Diagnosis not present

## 2019-08-18 DIAGNOSIS — I480 Paroxysmal atrial fibrillation: Secondary | ICD-10-CM | POA: Diagnosis not present

## 2019-08-18 DIAGNOSIS — I10 Essential (primary) hypertension: Secondary | ICD-10-CM | POA: Diagnosis not present

## 2019-08-30 DIAGNOSIS — I2510 Atherosclerotic heart disease of native coronary artery without angina pectoris: Secondary | ICD-10-CM | POA: Diagnosis not present

## 2019-08-30 DIAGNOSIS — R0602 Shortness of breath: Secondary | ICD-10-CM | POA: Diagnosis not present

## 2019-09-07 DIAGNOSIS — J441 Chronic obstructive pulmonary disease with (acute) exacerbation: Secondary | ICD-10-CM | POA: Diagnosis not present

## 2019-09-30 DIAGNOSIS — R0602 Shortness of breath: Secondary | ICD-10-CM | POA: Diagnosis not present

## 2019-09-30 DIAGNOSIS — I2510 Atherosclerotic heart disease of native coronary artery without angina pectoris: Secondary | ICD-10-CM | POA: Diagnosis not present

## 2019-10-04 DIAGNOSIS — R809 Proteinuria, unspecified: Secondary | ICD-10-CM | POA: Diagnosis not present

## 2019-10-04 DIAGNOSIS — R7301 Impaired fasting glucose: Secondary | ICD-10-CM | POA: Diagnosis not present

## 2019-10-04 DIAGNOSIS — R1312 Dysphagia, oropharyngeal phase: Secondary | ICD-10-CM | POA: Diagnosis not present

## 2019-10-04 DIAGNOSIS — E7849 Other hyperlipidemia: Secondary | ICD-10-CM | POA: Diagnosis not present

## 2019-10-04 DIAGNOSIS — J9611 Chronic respiratory failure with hypoxia: Secondary | ICD-10-CM | POA: Diagnosis not present

## 2019-10-05 DIAGNOSIS — J441 Chronic obstructive pulmonary disease with (acute) exacerbation: Secondary | ICD-10-CM | POA: Diagnosis not present

## 2019-10-12 DIAGNOSIS — Z950 Presence of cardiac pacemaker: Secondary | ICD-10-CM | POA: Diagnosis not present

## 2019-10-12 DIAGNOSIS — R809 Proteinuria, unspecified: Secondary | ICD-10-CM | POA: Diagnosis not present

## 2019-10-12 DIAGNOSIS — I10 Essential (primary) hypertension: Secondary | ICD-10-CM | POA: Diagnosis not present

## 2019-10-12 DIAGNOSIS — I480 Paroxysmal atrial fibrillation: Secondary | ICD-10-CM | POA: Diagnosis not present

## 2019-10-12 DIAGNOSIS — E782 Mixed hyperlipidemia: Secondary | ICD-10-CM | POA: Diagnosis not present

## 2019-10-28 DIAGNOSIS — I2510 Atherosclerotic heart disease of native coronary artery without angina pectoris: Secondary | ICD-10-CM | POA: Diagnosis not present

## 2019-10-28 DIAGNOSIS — R0602 Shortness of breath: Secondary | ICD-10-CM | POA: Diagnosis not present

## 2019-11-05 DIAGNOSIS — J441 Chronic obstructive pulmonary disease with (acute) exacerbation: Secondary | ICD-10-CM | POA: Diagnosis not present

## 2019-11-16 ENCOUNTER — Ambulatory Visit (INDEPENDENT_AMBULATORY_CARE_PROVIDER_SITE_OTHER): Payer: Medicare Other | Admitting: *Deleted

## 2019-11-16 DIAGNOSIS — Z950 Presence of cardiac pacemaker: Secondary | ICD-10-CM | POA: Diagnosis not present

## 2019-11-16 DIAGNOSIS — I495 Sick sinus syndrome: Secondary | ICD-10-CM

## 2019-11-16 DIAGNOSIS — I10 Essential (primary) hypertension: Secondary | ICD-10-CM | POA: Diagnosis not present

## 2019-11-16 DIAGNOSIS — I480 Paroxysmal atrial fibrillation: Secondary | ICD-10-CM

## 2019-11-16 DIAGNOSIS — R809 Proteinuria, unspecified: Secondary | ICD-10-CM | POA: Diagnosis not present

## 2019-11-16 DIAGNOSIS — E782 Mixed hyperlipidemia: Secondary | ICD-10-CM | POA: Diagnosis not present

## 2019-11-16 LAB — CUP PACEART INCLINIC DEVICE CHECK
Battery Impedance: 1673 Ohm
Battery Remaining Longevity: 44 mo
Battery Voltage: 2.77 V
Brady Statistic AP VP Percent: 0 %
Brady Statistic AP VS Percent: 59 %
Brady Statistic AS VP Percent: 0 %
Brady Statistic AS VS Percent: 41 %
Date Time Interrogation Session: 20210414135052
Implantable Lead Implant Date: 20100526
Implantable Lead Implant Date: 20100526
Implantable Lead Location: 753859
Implantable Lead Location: 753860
Implantable Lead Model: 5076
Implantable Lead Model: 5076
Implantable Pulse Generator Implant Date: 20100526
Lead Channel Impedance Value: 443 Ohm
Lead Channel Impedance Value: 497 Ohm
Lead Channel Pacing Threshold Amplitude: 0.5 V
Lead Channel Pacing Threshold Amplitude: 0.75 V
Lead Channel Pacing Threshold Pulse Width: 0.4 ms
Lead Channel Pacing Threshold Pulse Width: 0.4 ms
Lead Channel Sensing Intrinsic Amplitude: 0.7 mV
Lead Channel Sensing Intrinsic Amplitude: 8 mV
Lead Channel Setting Pacing Amplitude: 2 V
Lead Channel Setting Pacing Amplitude: 2.5 V
Lead Channel Setting Pacing Pulse Width: 0.4 ms
Lead Channel Setting Sensing Sensitivity: 2.8 mV

## 2019-11-16 NOTE — Progress Notes (Signed)
Pacemaker check in clinic. Normal device function. Thresholds, sensing, impedances consistent with previous measurements. Device programmed to maximize longevity. 0.7% AT/AF burden, longest ~6hrs, patient is a poor candidate for [facility] per MD notes. V rates elevated during AT/AF, though episodes are infrequent. 15 high ventricular rates, available EGMs/markers suggest 1:1 SVT and possible brief NSVT. Device programmed at appropriate safety margins. Histogram distribution appropriate for patient activity level. Device programmed to optimize intrinsic conduction. Estimated longevity 3.5 years. Patient declines remote monitoring. Patient education completed. ROV with Dr. Lovena Le in [place] on 04/24/20.

## 2019-11-28 DIAGNOSIS — R0602 Shortness of breath: Secondary | ICD-10-CM | POA: Diagnosis not present

## 2019-11-28 DIAGNOSIS — I2510 Atherosclerotic heart disease of native coronary artery without angina pectoris: Secondary | ICD-10-CM | POA: Diagnosis not present

## 2019-12-01 ENCOUNTER — Telehealth: Payer: Self-pay | Admitting: Orthopedic Surgery

## 2019-12-01 NOTE — Telephone Encounter (Signed)
Patient called to ask about having a repeat epidural injection; please advise if another appointment is needed or if it can be ordered. Patient's ph#'s are [phone] or cell# [phone]

## 2019-12-02 NOTE — Telephone Encounter (Signed)
Called patient. Aware of appointment.

## 2019-12-02 NOTE — Telephone Encounter (Signed)
Her lumbar studies are quite old. She needs an appointment, please.

## 2019-12-15 ENCOUNTER — Ambulatory Visit: Payer: Medicare Other | Admitting: Orthopedic Surgery

## 2019-12-15 ENCOUNTER — Other Ambulatory Visit: Payer: Self-pay

## 2019-12-15 VITALS — BP 168/98 | HR 82 | Temp 98.3°F | Ht 65.0 in | Wt 143.0 lb

## 2019-12-15 DIAGNOSIS — M545 Low back pain, unspecified: Secondary | ICD-10-CM

## 2019-12-15 MED ORDER — METHYLPREDNISOLONE ACETATE 40 MG/ML IJ SUSP
40.0000 mg | Freq: Once | INTRAMUSCULAR | Status: AC
  Administered 2019-12-15: 40 mg via INTRAMUSCULAR

## 2019-12-15 NOTE — Progress Notes (Signed)
Chief Complaint  Patient presents with  . Follow-up    Recheck on back pain    Joint is chronic bilateral lower back pain but does not want any surgery.  She does well with IM shots.  She has had no bowel or bladder dysfunction no fever no weight loss or red flags  Recommend to IM injections 1 on each side  Intramuscular injection procedure note  The patient has complained of pain in the right hip  We have decided that the best treatment option to give the patient pain relief is to do an intramuscular injection of Depo-Medrol   The patient gave verbal consent, timeout was taken as appropriate. The nurse injected    40Mg  of Depo-Medrol into the  Hip/gluteal   Intramuscular injection procedure note  The patient has complained of pain in the left hip  We have decided that the best treatment option to give the patient pain relief is to do an intramuscular injection of Depo-Medrol   The patient gave verbal consent, timeout was taken as appropriate. The nurse injected    40Mg  of Depo-Medrol into the  Hip/gluteal    Encounter Diagnosis  Name Primary?  . Acute bilateral low back pain without sciatica Yes

## 2019-12-15 NOTE — Addendum Note (Signed)
Addended byCandice Camp on: 12/15/2019 03:30 PM   Modules accepted: Orders

## 2019-12-19 DIAGNOSIS — R809 Proteinuria, unspecified: Secondary | ICD-10-CM | POA: Diagnosis not present

## 2019-12-19 DIAGNOSIS — I480 Paroxysmal atrial fibrillation: Secondary | ICD-10-CM | POA: Diagnosis not present

## 2019-12-19 DIAGNOSIS — E782 Mixed hyperlipidemia: Secondary | ICD-10-CM | POA: Diagnosis not present

## 2019-12-19 DIAGNOSIS — I10 Essential (primary) hypertension: Secondary | ICD-10-CM | POA: Diagnosis not present

## 2019-12-27 ENCOUNTER — Other Ambulatory Visit: Payer: Self-pay

## 2019-12-27 ENCOUNTER — Ambulatory Visit: Payer: Medicare Other | Admitting: Cardiology

## 2019-12-27 ENCOUNTER — Encounter: Payer: Self-pay | Admitting: Cardiology

## 2019-12-27 VITALS — BP 160/90 | HR 78 | Ht 65.5 in | Wt 141.0 lb

## 2019-12-27 DIAGNOSIS — I480 Paroxysmal atrial fibrillation: Secondary | ICD-10-CM | POA: Diagnosis not present

## 2019-12-27 DIAGNOSIS — I25119 Atherosclerotic heart disease of native coronary artery with unspecified angina pectoris: Secondary | ICD-10-CM | POA: Diagnosis not present

## 2019-12-27 DIAGNOSIS — Z950 Presence of cardiac pacemaker: Secondary | ICD-10-CM | POA: Diagnosis not present

## 2019-12-27 NOTE — Progress Notes (Signed)
Cardiology Office Note  Date: 12/27/2019   ID: [name], DOB Apr 15, 1936, MRN [id number]  PCP:  Celene Squibb, MD  Cardiologist:  Rozann Lesches, MD Electrophysiologist:  [name], MD   Chief Complaint  Patient presents with  . Cardiac follow-up    History of Present Illness: [name] is an 84 y.o. female last seen in November 2020.  She presents for a routine follow-up visit.  She does not report any recurring angina symptoms at this time.  She states that she has had a limited appetite with chronic dysphagia.  She does not report any palpitations or syncope.  She sees Dr. Lovena Le, Medtronic pacemaker in place.  Device interrogation in April showed very low AT/AF burden at 0.7%.  I reviewed her lab work from March obtained by Dr. Nevada Crane and outlined below.  We went over her medications today, she states that she has been compliant with therapy.  Her blood pressure is elevated today, she does not have a way to check it at home.  Past Medical History:  Diagnosis Date  . Anemia   . Anxiety   . Atrial fibrillation   . Chronic back pain   . COPD (chronic obstructive pulmonary disease) ([place])   . Coronary atherosclerosis of native coronary artery    DES x 2 to RCA 10/10  . 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 Essential hypertension   . GERD (gastroesophageal reflux disease)   . H/O hiatal hernia   . Headache(784.0)   . HOH (hard of hearing)   . Hyperlipidemia   . Neuromuscular disorder ([place])    Tremors  . Pericardial effusion    Hemorrhagic   . Presence of permanent cardiac pacemaker   . Tachycardia-bradycardia syndrome ([place])    s/p Medtronic Adapta L dual chamber device  5/10    Past Surgical History:  Procedure Laterality Date  . APPENDECTOMY    . CARDIAC CATHETERIZATION  2010   stents x2.  Marland Kitchen CATARACT EXTRACTION    . CHOLECYSTECTOMY    . COLONOSCOPY  2011  . COLONOSCOPY N/A 10/19/2014   Procedure: COLONOSCOPY;  Surgeon: Rogene Houston, MD;  [facility];  Service: Endoscopy;  Laterality: N/A;  1030  . ESOPHAGOGASTRODUODENOSCOPY N/A 10/26/2014   Procedure: ESOPHAGOGASTRODUODENOSCOPY (EGD);  Surgeon: Rogene Houston, MD;  [facility];  Service: Endoscopy;  Laterality: N/A;  230 - Dr. has lunch and learn  . Esophagogastroduodenoscopy with esophageal dilation  2004, 2006, 2007  . GIVENS CAPSULE STUDY N/A 10/31/2014   Procedure: GIVENS CAPSULE STUDY;  Surgeon: Rogene Houston, MD;  [facility];  Service: Endoscopy;  Laterality: N/A;  730 -- pacemaker--needs monitoring--outpatient bed  . INSERT / REPLACE / REMOVE PACEMAKER  2010  . LEFT HEART CATHETERIZATION WITH CORONARY ANGIOGRAM N/A 11/17/2011   Procedure: LEFT HEART CATHETERIZATION WITH CORONARY ANGIOGRAM;  Surgeon: Sherren Mocha, MD;  Location: [facility] CATH LAB;  Service: Cardiovascular;  Laterality: N/A;  . LEFT HEART CATHETERIZATION WITH CORONARY ANGIOGRAM N/A 09/26/2014   Procedure: LEFT HEART CATHETERIZATION WITH CORONARY ANGIOGRAM;  Surgeon: Leonie Man, MD;  Location: [facility] CATH LAB;  Service: Cardiovascular;  Laterality: N/A;  . Right rotator cuff repair    . SHOULDER ACROMIOPLASTY Right 05/30/2015   Procedure: RIGHT SHOULDER ACROMIOPLASTY;  Surgeon: Carole Civil, MD;  Location: [facility] ORS;  Service: Orthopedics;  Laterality: Right;  . SHOULDER OPEN ROTATOR CUFF REPAIR Right 05/30/2015   Procedure:  OPEN ROTATOR CUFF REPAIR RIGHT SHOULDER;  Surgeon: Carole Civil, MD;  Location: [facility] ORS;  Service: Orthopedics;  Laterality: Right;  . SHOULDER OPEN ROTATOR CUFF REPAIR Left 10/22/2016   Procedure: ROTATOR CUFF REPAIR SHOULDER OPEN;  Surgeon: Carole Civil, MD;  Location: [facility] ORS;  Service: Orthopedics;  Laterality: Left;  . Subxiphoid pericardial window  11/10  . VAGINAL HYSTERECTOMY      Current Outpatient Medications  Medication Sig Dispense Refill  . ALPRAZolam (XANAX) 1 MG tablet Take 1 mg by mouth 2 (two) times daily as  needed for anxiety or sleep.     Marland Kitchen amLODipine (NORVASC) 10 MG tablet TAKE (1) TABLET BY MOUTH AT BEDTIME. 90 tablet 3  . clopidogrel (PLAVIX) 75 MG tablet Take 75 mg by mouth daily.     . Cyanocobalamin (B-12 PO) Take 1 tablet by mouth daily.     Marland Kitchen esomeprazole (NEXIUM) 40 MG capsule Take 40 mg by mouth 2 (two) times daily as needed (acid reflux).     . folic acid (FOLVITE) A999333 MCG tablet Take 400 mcg by mouth daily.    . furosemide (LASIX) 40 MG tablet Take 60 mg by mouth every evening.     . irbesartan (AVAPRO) 150 MG tablet TAKE (1) TABLET BY MOUTH ONCE DAILY. 90 tablet 3  . levalbuterol (XOPENEX) 1.25 MG/3ML nebulizer solution     . metoprolol succinate (TOPROL-XL) 25 MG 24 hr tablet TAKE 2 TABLETS BY MOUTH 2 TIMES DAILY. 360 tablet 3  . nitroGLYCERIN (NITROSTAT) 0.4 MG SL tablet Place 0.4 mg under the tongue every 5 (five) minutes as needed for chest pain.     . Pediatric Multivitamins-Iron (FLINTSTONES PLUS IRON) chewable tablet Chew 1 tablet by mouth daily.    . potassium chloride SA (K-DUR,KLOR-CON) 20 MEQ tablet Take 20 mEq by mouth daily. Takes 1 tablet daily and a 2nd tablet only if has to take a 2nd dose of Lasi    . primidone (MYSOLINE) 50 MG tablet Take 50 mg by mouth at bedtime.     Marland Kitchen PROAIR HFA 108 (90 Base) MCG/ACT inhaler Inhale 1 puff into the lungs every 4 (four) hours as needed.     . rosuvastatin (CRESTOR) 10 MG tablet     . traMADol (ULTRAM) 50 MG tablet Take 1 tablet (50 mg total) by mouth every 6 (six) hours as needed. 60 tablet 5   No current facility-administered medications for this visit.   Allergies:  Amitriptyline hcl and Sulfonamide derivatives   ROS:   Chronic hearing loss.  Physical Exam: VS:  BP (!) 160/90   Pulse 78   Ht 5' 5.5" (1.664 m)   Wt 141 lb (64 kg)   SpO2 97%   BMI 23.11 kg/m , BMI Body mass index is 23.11 kg/m.  Wt Readings from Last 3 Encounters:  12/27/19 141 lb (64 kg)  12/15/19 143 lb (64.9 kg)  07/04/19 149 lb (67.6 kg)      General: Elderly woman in no distress. HEENT: Conjunctiva and lids normal, wearing a mask. Neck: Supple, no elevated JVP or carotid bruits, no thyromegaly. Lungs: Clear to auscultation, nonlabored breathing at rest. Cardiac: Regular rate and rhythm, no S3, soft systolic murmur, no pericardial rub. Extremities: No pitting edema, distal pulses 2+. Neuropsychiatric: Chronic tremor, hearing loss evident.  ECG:  An ECG dated 06/01/2018 was personally reviewed today and demonstrated:  Sinus rhythm with nonspecific ST-T wave changes.  Recent Labwork:  March 2021: Hemoglobin 14.1, platelets 215, BUN 8,  creatinine 0.64, potassium 4.7, AST 24, ALT 16, cholesterol 236, triglycerides 327, HDL 43, LDL 134, hemoglobin A1c 5.5%  Other Studies Reviewed Today:  Echocardiogram 06/01/2017: Study Conclusions  - Left ventricle: The cavity size was normal. Wall thickness was normal. Systolic function was vigorous. The estimated ejection fraction was in the range of 65% to 70%. The study is not technically sufficient to allow evaluation of LV diastolic function. - Aortic valve: There was mild regurgitation. - Mitral valve: Calcified annulus. Mildly thickened leaflets . There was mild regurgitation. - Left atrium: The atrium was moderately dilated. - Right atrium: The atrium was mildly dilated.  Assessment and Plan:  1.  CAD status post DES to the RCA in 2010.  We will continue medical therapy and observation in the absence of progressive angina symptoms.  Continue Plavix, Norvasc, Toprol-XL, and Crestor.  2.  Mixed hyperlipidemia on Crestor.  Recent LDL 134.  Suggest increasing to 20 mg daily if tolerated and keep follow-up with PCP.  3.  Tachycardia-bradycardia syndrome status post Medtronic pacemaker.  Keep follow-up with Dr. Lovena Le.  4.  Paroxysmal to persistent atrial fibrillation.  She is not anticoagulated based on previous discussion and history of hemorrhagic pericardial effusion.   Recent device interrogation showed low AT/AF burden.  Continue Toprol-XL.  Medication Adjustments/Labs and Tests Ordered: Current medicines are reviewed at length with the patient today.  Concerns regarding medicines are outlined above.   Tests Ordered: No orders of the defined types were placed in this encounter.   Medication Changes: No orders of the defined types were placed in this encounter.   Disposition:  Follow up 6 months in the [place] office.  Signed, Satira Sark, MD, [facility] 12/27/2019 1:40 PM    [facility] at [facility] [address] [place], [place] [address] Phone: [phone]; Fax: [phone]

## 2019-12-27 NOTE — Patient Instructions (Signed)
Medication Instructions:  Your physician recommends that you continue on your current medications as directed. Please refer to the Current Medication list given to you today.  *If you need a refill on your cardiac medications before your next appointment, please call your pharmacy*   Lab Work: NONE   If you have labs (blood work) drawn today and your tests are completely normal, you will receive your results only by: . MyChart Message (if you have MyChart) OR . A paper copy in the mail If you have any lab test that is abnormal or we need to change your treatment, we will call you to review the results.   Testing/Procedures: NONE    Follow-Up: At [facility], you and your health needs are our priority.  As part of our continuing mission to provide you with exceptional heart care, we have created designated Provider Care Teams.  These Care Teams include your primary Cardiologist (physician) and Advanced Practice Providers (APPs -  Physician Assistants and Nurse Practitioners) who all work together to provide you with the care you need, when you need it.  We recommend signing up for the patient portal called "MyChart".  Sign up information is provided on this After Visit Summary.  MyChart is used to connect with patients for Virtual Visits (Telemedicine).  Patients are able to view lab/test results, encounter notes, upcoming appointments, etc.  Non-urgent messages can be sent to your provider as well.   To learn more about what you can do with MyChart, go to [email or web address].    Your next appointment:   6 month(s)  The format for your next appointment:   In Person  Provider:   Samuel McDowell, MD   Other Instructions Thank you for choosing [facility]!    

## 2019-12-28 DIAGNOSIS — I2510 Atherosclerotic heart disease of native coronary artery without angina pectoris: Secondary | ICD-10-CM | POA: Diagnosis not present

## 2019-12-28 DIAGNOSIS — R0602 Shortness of breath: Secondary | ICD-10-CM | POA: Diagnosis not present

## 2020-01-10 DIAGNOSIS — E782 Mixed hyperlipidemia: Secondary | ICD-10-CM | POA: Diagnosis not present

## 2020-01-10 DIAGNOSIS — I480 Paroxysmal atrial fibrillation: Secondary | ICD-10-CM | POA: Diagnosis not present

## 2020-01-10 DIAGNOSIS — R809 Proteinuria, unspecified: Secondary | ICD-10-CM | POA: Diagnosis not present

## 2020-01-10 DIAGNOSIS — I10 Essential (primary) hypertension: Secondary | ICD-10-CM | POA: Diagnosis not present

## 2020-01-18 DIAGNOSIS — E7849 Other hyperlipidemia: Secondary | ICD-10-CM | POA: Diagnosis not present

## 2020-01-18 DIAGNOSIS — R7301 Impaired fasting glucose: Secondary | ICD-10-CM | POA: Diagnosis not present

## 2020-01-18 DIAGNOSIS — D509 Iron deficiency anemia, unspecified: Secondary | ICD-10-CM | POA: Diagnosis not present

## 2020-01-18 DIAGNOSIS — E539 Vitamin B deficiency, unspecified: Secondary | ICD-10-CM | POA: Diagnosis not present

## 2020-01-18 DIAGNOSIS — E782 Mixed hyperlipidemia: Secondary | ICD-10-CM | POA: Diagnosis not present

## 2020-01-19 DIAGNOSIS — I10 Essential (primary) hypertension: Secondary | ICD-10-CM | POA: Diagnosis not present

## 2020-01-19 DIAGNOSIS — E782 Mixed hyperlipidemia: Secondary | ICD-10-CM | POA: Diagnosis not present

## 2020-01-19 DIAGNOSIS — I480 Paroxysmal atrial fibrillation: Secondary | ICD-10-CM | POA: Diagnosis not present

## 2020-01-19 DIAGNOSIS — Z950 Presence of cardiac pacemaker: Secondary | ICD-10-CM | POA: Diagnosis not present

## 2020-01-19 DIAGNOSIS — R809 Proteinuria, unspecified: Secondary | ICD-10-CM | POA: Diagnosis not present

## 2020-01-28 DIAGNOSIS — I2510 Atherosclerotic heart disease of native coronary artery without angina pectoris: Secondary | ICD-10-CM | POA: Diagnosis not present

## 2020-01-28 DIAGNOSIS — R0602 Shortness of breath: Secondary | ICD-10-CM | POA: Diagnosis not present

## 2020-02-01 DIAGNOSIS — R0789 Other chest pain: Secondary | ICD-10-CM | POA: Diagnosis not present

## 2020-02-01 DIAGNOSIS — M79604 Pain in right leg: Secondary | ICD-10-CM | POA: Diagnosis not present

## 2020-02-16 DIAGNOSIS — I480 Paroxysmal atrial fibrillation: Secondary | ICD-10-CM | POA: Diagnosis not present

## 2020-02-16 DIAGNOSIS — I10 Essential (primary) hypertension: Secondary | ICD-10-CM | POA: Diagnosis not present

## 2020-02-16 DIAGNOSIS — R809 Proteinuria, unspecified: Secondary | ICD-10-CM | POA: Diagnosis not present

## 2020-02-16 DIAGNOSIS — E782 Mixed hyperlipidemia: Secondary | ICD-10-CM | POA: Diagnosis not present

## 2020-02-27 DIAGNOSIS — R0602 Shortness of breath: Secondary | ICD-10-CM | POA: Diagnosis not present

## 2020-02-27 DIAGNOSIS — I2510 Atherosclerotic heart disease of native coronary artery without angina pectoris: Secondary | ICD-10-CM | POA: Diagnosis not present

## 2020-03-29 DIAGNOSIS — I2510 Atherosclerotic heart disease of native coronary artery without angina pectoris: Secondary | ICD-10-CM | POA: Diagnosis not present

## 2020-03-29 DIAGNOSIS — R0602 Shortness of breath: Secondary | ICD-10-CM | POA: Diagnosis not present

## 2020-04-02 DIAGNOSIS — I480 Paroxysmal atrial fibrillation: Secondary | ICD-10-CM | POA: Diagnosis not present

## 2020-04-02 DIAGNOSIS — R0602 Shortness of breath: Secondary | ICD-10-CM | POA: Diagnosis not present

## 2020-04-02 DIAGNOSIS — M79604 Pain in right leg: Secondary | ICD-10-CM | POA: Diagnosis not present

## 2020-04-02 DIAGNOSIS — R0789 Other chest pain: Secondary | ICD-10-CM | POA: Diagnosis not present

## 2020-04-05 ENCOUNTER — Encounter: Payer: Self-pay | Admitting: *Deleted

## 2020-04-05 ENCOUNTER — Other Ambulatory Visit: Payer: Self-pay | Admitting: *Deleted

## 2020-04-05 NOTE — Patient Outreach (Signed)
[facility]) Care Management  04/05/2020  [name] 18-Feb-1936 [id number]   Referral Date: 8/31 Referral Source: MD office Referral Reason: SW and see what assistance patient can get Insurance: [place] attempt #1, successful.  Identity verified.  This care manager introduced self and stated purpose of call.  [facility] care management services explained.    Social: Patient report living alone, state she is independent in ADL's but is interested in having some additional support in the home to help with basic needs.  State she is able to prepare simple meals, report eating sandwiches and soups most of the time.  She would like for someone to come in to help with the upkeep of her home a few times a week as well as laundry.  She denies having Meals on Wheels or Mom's meals, discussed having referral for both to eliminate having to worry about at least one meal a day, she declines stating she feel she is eating properly.  State she is adherent to low salt diet.    She has a niece listed as her emergency contact but state she will need to remove her name as she is now dealing with her own illnesses.  Denies having any one specifically to help with management of her care but state she does have a nephew that comes to help when it is needed.  She speaks of a friend that has an aide coming into the home through the CAPs program but verbalizes understanding that the requested benefits are through [facility].  She denies receiving Medicaid, does not feel she would be eligible.    Conditions: Per chart, has history of HTN, A-fib, HLD, GERD, CAD, Anxiety, and Tremor.   Medications: Reviewed with member, report adherence.  Denies questions or need for financial assistance.  Appointments: Was seen by PCP on 8/30, recommended follow up for 3 months.  She also has visits scheduled with GI on 9/9 and with cardiology on 9/21.  State she will be able to drive herself to these  appointments.  Member feels she has been managing her chronic medical conditions well, no hospitalizations or ED visits in over 3 years.  She again declines offer to have prepared meal delivery and does not feel she will qualify for Medicaid to have an aide in the home.  She is not able to pay out of pocket for services.  She denies any other concerns at this time.  Plan: RN CM will collaborate with CSW regarding qualifications for Medicaid eligibility and having an aide in the home.  Will follow up with member within the next 2 weeks.  Valente David, [place], MSN [place] [phone]

## 2020-04-12 ENCOUNTER — Encounter (INDEPENDENT_AMBULATORY_CARE_PROVIDER_SITE_OTHER): Payer: Self-pay | Admitting: *Deleted

## 2020-04-12 ENCOUNTER — Ambulatory Visit (INDEPENDENT_AMBULATORY_CARE_PROVIDER_SITE_OTHER): Payer: Medicare Other | Admitting: Gastroenterology

## 2020-04-12 ENCOUNTER — Encounter (INDEPENDENT_AMBULATORY_CARE_PROVIDER_SITE_OTHER): Payer: Self-pay | Admitting: Gastroenterology

## 2020-04-12 ENCOUNTER — Other Ambulatory Visit: Payer: Self-pay

## 2020-04-12 VITALS — BP 179/79 | HR 66 | Temp 97.8°F | Ht 65.0 in | Wt 148.8 lb

## 2020-04-12 DIAGNOSIS — R1310 Dysphagia, unspecified: Secondary | ICD-10-CM | POA: Diagnosis not present

## 2020-04-12 DIAGNOSIS — Z7902 Long term (current) use of antithrombotics/antiplatelets: Secondary | ICD-10-CM | POA: Diagnosis not present

## 2020-04-12 DIAGNOSIS — K219 Gastro-esophageal reflux disease without esophagitis: Secondary | ICD-10-CM

## 2020-04-12 NOTE — Patient Instructions (Signed)
We are scheduling endoscopy for evaluation. Continue soft foods in interim.

## 2020-04-12 NOTE — Progress Notes (Signed)
Patient profile: [name] is a 84 y.o. female seen for follow up - last seen 03/2017 for chronic nausea.  She has a history in 2016 of IDA and had EGD colonoscopy and capsule study.  IDA felt to be related to small bowel angiodysplasia.  History of Present Illness: [name] is seen today for evaluation of dysphagia.  She reports dysphagia has been going on for years but is worse recently.  She is currently mainly eating crackers, Ensure, Gatorade due to severity of symptoms.  Symptoms include food sticking in upper esophageal area and caused a choking sensation.  She denies any acid reflux history.  Feels that she does not have GERD symptoms or regurgitation frequently because she is not eating well.  She does get some volume regurgitation with bending.  Very occasional nausea that resolved with Zofran.  No vomiting.  Typically has 1 or 2 loose stools in the mornings and then no further problems throughout the day.  No obvious rectal bleed or melena.  Denies abdominal pain.  She takes an iron supplement intermittently as this can cause constipation when taken routinely.  She does take multivitamin daily.  She does not use NSAIDs.  No alcohol non-smoker.  Accompanied by her friend [name] to visit.   Wt Readings from Last 3 Encounters:  04/12/20 148 lb 12.8 oz (67.5 kg)  12/27/19 141 lb (64 kg)  12/15/19 143 lb (64.9 kg)   weight 2018-164#   Last Colonoscopy: 2016- Findings:  Prep excellent. 4 mm polyp ablated via cold biopsy from proximal transverse colon. Mucosa of rest of the colon and rectum was normal. Hemorrhoids noted above and below the dentate line.     Last Endoscopy: Impression:2016 Small sliding hiatal hernia and incomplete noncritical ring at GE junction. No evidence of peptic ulcer disease or other lesions to account for GI bleed.  Comment; No bleeding source identified in upper GI tract. Since iron studies suggest iron deficiency anemia small bowel needs to be  evaluated  Capsule 2016-Summary & Recommendations: Prolonged esophageal transmit time of given capsule consistent with patient's history of esophageal motility disorder. Multiple small bowel angiodysplasia without active bleeding. Patient advised to resume clopidogrel.   Past Medical History:  Past Medical History:  Diagnosis Date  . Anemia   . Anxiety   . Atrial fibrillation   . Chronic back pain   . COPD (chronic obstructive pulmonary disease) ([place])   . Coronary atherosclerosis of native coronary artery    DES x 2 to RCA 10/10  . 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 Essential hypertension   . GERD (gastroesophageal reflux disease)   . H/O hiatal hernia   . Headache(784.0)   . HOH (hard of hearing)   . Hyperlipidemia   . Neuromuscular disorder ([place])    Tremors  . Pericardial effusion    Hemorrhagic   . Presence of permanent cardiac pacemaker   . Tachycardia-bradycardia syndrome ([place])    s/p Medtronic Adapta L dual chamber device  5/10    Problem List: Patient Active Problem List   Diagnosis Date Noted  . Nonspecific chest pain 09/15/2016  . Complete rotator cuff tear of left shoulder   . Anemia 10/31/2014  . Chest pain with moderate risk for cardiac etiology 08/23/2014  . Diastolic dysfunction with chronic heart failure ([place]) 08/23/2014  . PERICARDIAL EFFUSION 07/10/2009  . Essential hypertension, benign 04/18/2009  . Atherosclerosis of native coronary artery with angina pectoris ([place]) 04/18/2009  .  PPM-Medtronic 04/04/2009  . BRADYCARDIA-TACHYCARDIA SYNDROME 01/26/2009  . HLD (hyperlipidemia) 12/19/2008  . Anxiety state 12/19/2008  . Atrial fibrillation ([place]) 12/19/2008  . GERD 12/19/2008  . Abnormal involuntary movement 12/19/2008    Past Surgical History: Past Surgical History:  Procedure Laterality Date  . APPENDECTOMY    . CARDIAC CATHETERIZATION  2010   stents x2.  Marland Kitchen CATARACT EXTRACTION    . CHOLECYSTECTOMY    . COLONOSCOPY   2011  . COLONOSCOPY N/A 10/19/2014   Procedure: COLONOSCOPY;  Surgeon: Rogene Houston, MD;  [facility];  Service: Endoscopy;  Laterality: N/A;  1030  . ESOPHAGOGASTRODUODENOSCOPY N/A 10/26/2014   Procedure: ESOPHAGOGASTRODUODENOSCOPY (EGD);  Surgeon: Rogene Houston, MD;  Location: [facility];  Service: Endoscopy;  Laterality: N/A;  230 - Dr. has lunch and learn  . Esophagogastroduodenoscopy with esophageal dilation  2004, 2006, 2007  . GIVENS CAPSULE STUDY N/A 10/31/2014   Procedure: GIVENS CAPSULE STUDY;  Surgeon: Rogene Houston, MD;  Location: [facility];  Service: Endoscopy;  Laterality: N/A;  730 -- pacemaker--needs monitoring--outpatient bed  . INSERT / REPLACE / REMOVE PACEMAKER  2010  . LEFT HEART CATHETERIZATION WITH CORONARY ANGIOGRAM N/A 11/17/2011   Procedure: LEFT HEART CATHETERIZATION WITH CORONARY ANGIOGRAM;  Surgeon: Sherren Mocha, MD;  Location: [facility] CATH LAB;  Service: Cardiovascular;  Laterality: N/A;  . LEFT HEART CATHETERIZATION WITH CORONARY ANGIOGRAM N/A 09/26/2014   Procedure: LEFT HEART CATHETERIZATION WITH CORONARY ANGIOGRAM;  Surgeon: Leonie Man, MD;  Location: [facility] CATH LAB;  Service: Cardiovascular;  Laterality: N/A;  . Right rotator cuff repair    . SHOULDER ACROMIOPLASTY Right 05/30/2015   Procedure: RIGHT SHOULDER ACROMIOPLASTY;  Surgeon: Carole Civil, MD;  Location: [facility] ORS;  Service: Orthopedics;  Laterality: Right;  . SHOULDER OPEN ROTATOR CUFF REPAIR Right 05/30/2015   Procedure: OPEN ROTATOR CUFF REPAIR RIGHT SHOULDER;  Surgeon: Carole Civil, MD;  Location: [facility] ORS;  Service: Orthopedics;  Laterality: Right;  . SHOULDER OPEN ROTATOR CUFF REPAIR Left 10/22/2016   Procedure: ROTATOR CUFF REPAIR SHOULDER OPEN;  Surgeon: Carole Civil, MD;  Location: [facility] ORS;  Service: Orthopedics;  Laterality: Left;  . Subxiphoid pericardial window  11/10  . VAGINAL HYSTERECTOMY      Allergies: Allergies  Allergen Reactions  . Amitriptyline  Hcl Other (See Comments)    Caused "jaws to twist and lock"  . Sulfonamide Derivatives Other (See Comments)    UNKNOWN REACTION      Home Medications:  Current Outpatient Medications:  .  ALPRAZolam (XANAX) 1 MG tablet, Take 1 mg by mouth 2 (two) times daily as needed for anxiety or sleep. , Disp: , Rfl:  .  clopidogrel (PLAVIX) 75 MG tablet, Take 75 mg by mouth daily. , Disp: , Rfl:  .  Cyanocobalamin (B-12 PO), Take 1 tablet by mouth daily. , Disp: , Rfl:  .  esomeprazole (NEXIUM) 40 MG capsule, Take 40 mg by mouth 2 (two) times daily as needed (acid reflux). , Disp: , Rfl:  .  folic acid (FOLVITE) 932 MCG tablet, Take 400 mcg by mouth daily., Disp: , Rfl:  .  furosemide (LASIX) 40 MG tablet, Take 60 mg by mouth every evening. , Disp: , Rfl:  .  irbesartan (AVAPRO) 150 MG tablet, TAKE (1) TABLET BY MOUTH ONCE DAILY., Disp: 90 tablet, Rfl: 3 .  metoprolol succinate (TOPROL-XL) 25 MG 24 hr tablet, TAKE 2 TABLETS BY MOUTH 2 TIMES DAILY., Disp: 360 tablet, Rfl: 3 .  nitroGLYCERIN (NITROSTAT) 0.4 MG SL tablet, Place 0.4 mg under the tongue every 5 (five) minutes as needed for chest pain. , Disp: , Rfl:  .  potassium chloride SA (K-DUR,KLOR-CON) 20 MEQ tablet, Take 20 mEq by mouth daily. Takes 1 tablet daily and a 2nd tablet only if has to take a 2nd dose of Lasi, Disp: , Rfl:  .  primidone (MYSOLINE) 50 MG tablet, Take 50 mg by mouth at bedtime. , Disp: , Rfl:  .  PROAIR HFA 108 (90 Base) MCG/ACT inhaler, Inhale 1 puff into the lungs every 4 (four) hours as needed. , Disp: , Rfl:  .  amLODipine (NORVASC) 10 MG tablet, TAKE (1) TABLET BY MOUTH AT BEDTIME. (Patient not taking: Reported on 04/05/2020), Disp: 90 tablet, Rfl: 3 .  levalbuterol (XOPENEX) 1.25 MG/3ML nebulizer solution, , Disp: , Rfl:  .  Pediatric Multivitamins-Iron (FLINTSTONES PLUS IRON) chewable tablet, Chew 1 tablet by mouth daily. (Patient not taking: Reported on 04/12/2020), Disp: , Rfl:  .  rosuvastatin (CRESTOR) 10 MG tablet, ,  Disp: , Rfl:  .  traMADol (ULTRAM) 50 MG tablet, Take 1 tablet (50 mg total) by mouth every 6 (six) hours as needed. (Patient not taking: Reported on 04/12/2020), Disp: 60 tablet, Rfl: 5   Family History: family history includes Arthritis in an other family member; Asthma in an other family member; Cancer in her mother; Coronary artery disease in her brother and sister; Lung disease in an other family member.    Social History:   reports that she quit smoking about 30 years ago. Her smoking use included cigarettes. She has a 20.00 pack-year smoking history. She has never used smokeless tobacco. She reports that she does not drink alcohol and does not use drugs.   Review of Systems: Constitutional: Denies weight loss/weight gain  Eyes: No changes in vision. ENT: No oral lesions, sore throat.  GI: see HPI.  Heme/Lymph: No easy bruising.  CV: No chest pain.  GU: No hematuria.  Integumentary: No rashes.  Neuro: No headaches.  Psych: No depression/anxiety.  Endocrine: No heat/cold intolerance.  Allergic/Immunologic: No urticaria.  Resp: No cough, SOB.  Musculoskeletal: No joint swelling.    Physical Examination: BP (!) 179/79 (BP Location: Left Arm, Patient Position: Sitting, Cuff Size: Normal)   Pulse 66   Temp 97.8 F (36.6 C) (Oral)   Ht 5\' 5"  (1.651 m)   Wt 148 lb 12.8 oz (67.5 kg)   BMI 24.76 kg/m  *reports has not taken her mid day BP MED Gen: NAD, alert and oriented x 4 HEENT: PEERLA, EOMI, Neck: supple, no JVD Chest: CTA bilaterally, no wheezes, crackles, or other adventitious sounds CV: RRR, no m/g/c/r Abd: soft, NT, ND, +BS in all four quadrants; no HSM, guarding, ridigity, or rebound tenderness Ext: no edema, well perfused with 2+ pulses, Skin: no rash or lesions noted on observed skin Lymph: no noted LAD  Data Reviewed:    Labs June 2021-hemoglobin 13.4, MCV 90, CMP normal  Assessment/Plan: Ms. Luz is a 84 y.o. female   1. Dysphagia -describes severe  symptoms leading to weight loss and not able to tolerate a normal diet. Initially start with upper endoscopy for evaluation.  She may also have dysmotility given her age.  She will continue her PPI and diet modifications prior to endoscopy.  No lower GI symptoms.  Remote history of iron deficiency anemia but recent hemoglobin normal.   She is on Plavix and reports holding this in past without any issues.  Will verify with her cardiologist can be held prior to procedure  Patient denies CP, SOB, and use of blood thinners. I discussed the risks and benefits of procedure including bleeding, perforation, infection, missed lesions, medication reactions and possible hospitalization or surgery if complications. All questions answered. Denies prior issues w/ sedation.     Audris was seen today for follow-up.  Diagnoses and all orders for this visit:  Chronic GERD  Dysphagia, unspecified type  Antiplatelet or antithrombotic long-term use        I personally performed the service, non-incident to. (WP)  Laurine Blazer, [facility]

## 2020-04-13 ENCOUNTER — Other Ambulatory Visit: Payer: Self-pay | Admitting: *Deleted

## 2020-04-13 NOTE — Patient Outreach (Signed)
[facility]) Care Management  04/13/2020  [name] 10/16/1935 [id number]   Call placed to member to follow up on needs and decision to proceed with Medicaid application to increase support in the home.  She report she feels she is doing better, able to do more around the home, leg not hurting as much.  She again state she does not want to apply for Medicaid due to her income, advised there are other qualifications she may be eligible for.  She declines, stating if she need it she will request help with the application process later.  Inquired again about prepared meals, she state she is able to prepare her meal, does not need this service at this time, will consider it for the future.  This care manager discussed ongoing involvement with disease management of chronic HTN, she agrees.  Noted per chart that BP was slightly elevated during recent office visits, she relates this to being nervous and not taking her medication before going.  State it is usually normal, however she does not have a monitor at home.  Report she was supposed to get one from her PCP office, will follow up with office.  Agrees to ongoing management of HTN, will place referral to health coach.  Valente David, [place], MSN [place] [phone]

## 2020-04-17 ENCOUNTER — Other Ambulatory Visit: Payer: Self-pay | Admitting: *Deleted

## 2020-04-18 ENCOUNTER — Other Ambulatory Visit (INDEPENDENT_AMBULATORY_CARE_PROVIDER_SITE_OTHER): Payer: Self-pay | Admitting: *Deleted

## 2020-04-24 ENCOUNTER — Other Ambulatory Visit: Payer: Self-pay

## 2020-04-24 ENCOUNTER — Encounter: Payer: Self-pay | Admitting: Internal Medicine

## 2020-04-24 ENCOUNTER — Ambulatory Visit: Payer: Medicare Other | Admitting: Internal Medicine

## 2020-04-24 VITALS — BP 136/78 | HR 66 | Ht 65.5 in | Wt 148.0 lb

## 2020-04-24 DIAGNOSIS — Z950 Presence of cardiac pacemaker: Secondary | ICD-10-CM

## 2020-04-24 DIAGNOSIS — I480 Paroxysmal atrial fibrillation: Secondary | ICD-10-CM

## 2020-04-24 DIAGNOSIS — I495 Sick sinus syndrome: Secondary | ICD-10-CM

## 2020-04-24 MED ORDER — DIGOXIN 125 MCG PO TABS: ORAL_TABLET | ORAL | 3 refills | Status: DC

## 2020-04-24 NOTE — Patient Instructions (Signed)
Medication Instructions:  Your physician has recommended you make the following change in your medication:   Start digoxin 0.125 mg daily and none on Saturday and Sunday   Stop Plavix 5 Days prior to Endoscopy   *If you need a refill on your cardiac medications before your next appointment, please call your pharmacy*   Lab Work: NONE  If you have labs (blood work) drawn today and your tests are completely normal, you will receive your results only by:  [place] (if you have MyChart) OR  A paper copy in the mail If you have any lab test that is abnormal or we need to change your treatment, we will call you to review the results.   Testing/Procedures: NONE    Follow-Up: At [facility], you and your health needs are our priority.  As part of our continuing mission to provide you with exceptional heart care, we have created designated Provider Care Teams.  These Care Teams include your primary Cardiologist (physician) and Advanced Practice Providers (APPs -  Physician Assistants and Nurse Practitioners) who all work together to provide you with the care you need, when you need it.  We recommend signing up for the patient portal called "MyChart".  Sign up information is provided on this After Visit Summary.  MyChart is used to connect with patients for Virtual Visits (Telemedicine).  Patients are able to view lab/test results, encounter notes, upcoming appointments, etc.  Non-urgent messages can be sent to your provider as well.   To learn more about what you can do with MyChart, go to [email or web address].    Your next appointment:   1 year(s)  The format for your next appointment:   In Person  Provider:   [name], MD   Other Instructions Thank you for choosing [facility]!

## 2020-04-24 NOTE — Progress Notes (Signed)
HPI Ms. Buhrman returns today for followup. She is a pleasant 84 yo woman with a h/o CAD, chronic dyspnea, sinus node dysfunction, s/p PPM insertion. She has PAF, she has not been on systemic anti-coagulation due to her h/o hemorrhagic pericarditis. She is pending endoscopy.  Allergies  Allergen Reactions  . Amitriptyline Hcl Other (See Comments)    Caused "jaws to twist and lock"  . Sulfonamide Derivatives Other (See Comments)    UNKNOWN REACTION     Current Outpatient Medications  Medication Sig Dispense Refill  . ALPRAZolam (XANAX) 1 MG tablet Take 1 mg by mouth 2 (two) times daily as needed for anxiety or sleep.     . clopidogrel (PLAVIX) 75 MG tablet Take 75 mg by mouth daily.     . Cyanocobalamin (VITAMIN B-12) 5000 MCG SUBL Take 5,000 mcg by mouth daily.    . diclofenac Sodium (VOLTAREN) 1 % GEL Apply 2-4 g topically every 6 (six) hours as needed (leg/knee pain.).     Marland Kitchen esomeprazole (NEXIUM) 40 MG capsule Take 40 mg by mouth daily.     . folic acid (FOLVITE) 619 MCG tablet Take 400 mcg by mouth daily.    . furosemide (LASIX) 40 MG tablet Take 40 mg by mouth See admin instructions. Take 1 tablet (40 mg) by mouth (scheduled) in the morning, may take an additional dose if needed for swelling.    . irbesartan (AVAPRO) 150 MG tablet TAKE (1) TABLET BY MOUTH ONCE DAILY. (Patient taking differently: Take 150 mg by mouth daily at 2 PM. ) 90 tablet 3  . metoprolol succinate (TOPROL-XL) 25 MG 24 hr tablet TAKE 2 TABLETS BY MOUTH 2 TIMES DAILY. (Patient taking differently: Take 50 mg by mouth in the morning and at bedtime. ) 360 tablet 3  . Multiple Vitamin (MULTIVITAMIN WITH MINERALS) TABS tablet Take 1 tablet by mouth daily.    . nitroGLYCERIN (NITROSTAT) 0.4 MG SL tablet Place 0.4 mg under the tongue every 5 (five) minutes x 3 doses as needed for chest pain.     Marland Kitchen ondansetron (ZOFRAN) 4 MG tablet Take 4 mg by mouth every 6 (six) hours as needed for nausea or vomiting.     . potassium  chloride SA (K-DUR,KLOR-CON) 20 MEQ tablet Take 20 mEq by mouth See admin instructions. Takes 1 tablet (20 meq) by mouth daily (scheduled) & a 2nd tablet only if has to take a 2nd dose of Lasix    . primidone (MYSOLINE) 50 MG tablet Take 50 mg by mouth at bedtime.     Marland Kitchen PROAIR HFA 108 (90 Base) MCG/ACT inhaler Inhale 1 puff into the lungs every 4 (four) hours as needed for wheezing or shortness of breath.     . rosuvastatin (CRESTOR) 20 MG tablet Take 20 mg by mouth at bedtime.     No current facility-administered medications for this visit.     Past Medical History:  Diagnosis Date  . Anemia   . Anxiety   . Atrial fibrillation   . Chronic back pain   . COPD (chronic obstructive pulmonary disease) ([place])   . Coronary atherosclerosis of native coronary artery    DES x 2 to RCA 10/10  . 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 Essential hypertension   . GERD (gastroesophageal reflux disease)   . H/O hiatal hernia   . Headache(784.0)   . HOH (hard of hearing)   . Hyperlipidemia   . Neuromuscular disorder (  [place])    Tremors  . Pericardial effusion    Hemorrhagic   . Presence of permanent cardiac pacemaker   . Tachycardia-bradycardia syndrome ([place])    s/p Medtronic Adapta L dual chamber device  5/10    ROS:   All systems reviewed and negative except as noted in the HPI.   Past Surgical History:  Procedure Laterality Date  . APPENDECTOMY    . CARDIAC CATHETERIZATION  2010   stents x2.  Marland Kitchen CATARACT EXTRACTION    . CHOLECYSTECTOMY    . COLONOSCOPY  2011  . COLONOSCOPY N/A 10/19/2014   Procedure: COLONOSCOPY;  Surgeon: Rogene Houston, MD;  Location: [facility] ENDO SU[facility];  Service: Endoscopy;  Laterality: N/A;  1030  . ESOPHAGOGASTRODUODENOSCOPY N/A 10/26/2014   Procedure: ESOPHAGOGASTRODUODENOSCOPY (EGD);  Surgeon: Rogene Houston, MD;  Location: [facility] ENDO SUITE;  Service: Endoscopy;  Laterality: N/A;  230 - Dr. has lunch and learn  . Esophagogastroduodenoscopy with  esophageal dilation  2004, 2006, 2007  . GIVENS CAPSULE STUDY N/A 10/31/2014   Procedure: GIVENS CAPSULE STUDY;  Surgeon: Rogene Houston, MD;  Location: [facility] ENDO SUITE;  Service: Endoscopy;  Laterality: N/A;  730 -- pacemaker--needs monitoring--outpatient bed  . INSERT / REPLACE / REMOVE PACEMAKER  2010  . LEFT HEART CATHETERIZATION WITH CORONARY ANGIOGRAM N/A 11/17/2011   Procedure: LEFT HEART CATHETERIZATION WITH CORONARY ANGIOGRAM;  Surgeon: Sherren Mocha, MD;  Location: [facility] CATH LAB;  Service: Cardiovascular;  Laterality: N/A;  . LEFT HEART CATHETERIZATION WITH CORONARY ANGIOGRAM N/A 09/26/2014   Procedure: LEFT HEART CATHETERIZATION WITH CORONARY ANGIOGRAM;  Surgeon: Leonie Man, MD;  Location: [facility] CATH LAB;  Service: Cardiovascular;  Laterality: N/A;  . Right rotator cuff repair    . SHOULDER ACROMIOPLASTY Right 05/30/2015   Procedure: RIGHT SHOULDER ACROMIOPLASTY;  Surgeon: Carole Civil, MD;  Location: [facility] ORS;  Service: Orthopedics;  Laterality: Right;  . SHOULDER OPEN ROTATOR CUFF REPAIR Right 05/30/2015   Procedure: OPEN ROTATOR CUFF REPAIR RIGHT SHOULDER;  Surgeon: Carole Civil, MD;  Location: [facility] ORS;  Service: Orthopedics;  Laterality: Right;  . SHOULDER OPEN ROTATOR CUFF REPAIR Left 10/22/2016   Procedure: ROTATOR CUFF REPAIR SHOULDER OPEN;  Surgeon: Carole Civil, MD;  Location: [facility] ORS;  Service: Orthopedics;  Laterality: Left;  . Subxiphoid pericardial window  11/10  . VAGINAL HYSTERECTOMY       Family History  Problem Relation Age of Onset  . Cancer Mother        Colon   . Coronary artery disease Sister   . Coronary artery disease Brother   . Arthritis Other   . Lung disease Other   . Asthma Other      Social History   Socioeconomic History  . Marital status: Widowed    Spouse name: Not on file  . Number of children: Not on file  . Years of education: Not on file  . Highest education level: Not on file  Occupational History  . Occupation:  Retired    Fish farm manager: RETIRED  Tobacco Use  . Smoking status: Former Smoker    Packs/day: 2.00    Years: 10.00    Pack years: 20.00    Types: Cigarettes    Quit date: 05/23/1989    Years since quitting: 30.9  . Smokeless tobacco: Never Used  Vaping Use  . Vaping Use: Never used  Substance and Sexual Activity  . Alcohol use: No    Alcohol/week: 0.0 standard drinks  . Drug use: No  .  Sexual activity: Not on file  Other Topics Concern  . Not on file  Social History Narrative   She lives alone, has adopted daughter.   Social Determinants of Health   Financial Resource Strain:   . Difficulty of Paying Living Expenses: Not on file  Food Insecurity: No Food Insecurity  . Worried About Charity fundraiser in the Last Year: Never true  . Ran Out of Food in the Last Year: Never true  Transportation Needs: No Transportation Needs  . Lack of Transportation (Medical): No  . Lack of Transportation (Non-Medical): No  Physical Activity:   . Days of Exercise per Week: Not on file  . Minutes of Exercise per Session: Not on file  Stress:   . Feeling of Stress : Not on file  Social Connections:   . Frequency of Communication with Friends and Family: Not on file  . Frequency of Social Gatherings with Friends and Family: Not on file  . Attends Religious Services: Not on file  . Active Member of Clubs or Organizations: Not on file  . Attends Archivist Meetings: Not on file  . Marital Status: Not on file  Intimate Partner Violence:   . Fear of Current or Ex-Partner: Not on file  . Emotionally Abused: Not on file  . Physically Abused: Not on file  . Sexually Abused: Not on file     BP 136/78   Pulse 66   Ht 5' 5.5" (1.664 m)   Wt 148 lb (67.1 kg)   SpO2 95%   BMI 24.25 kg/m   Physical Exam:  Well appearing NAD HEENT: Unremarkable Neck:  No JVD, no thyromegally Lymphatics:  No adenopathy Back:  No CVA tenderness Lungs:  Clear with no wheezes HEART:  Regular rate  rhythm, no murmurs, no rubs, no clicks Abd:  soft, positive bowel sounds, no organomegally, no rebound, no guarding Ext:  2 plus pulses, no edema, no cyanosis, no clubbing Skin:  No rashes no nodules Neuro:  CN II through XII intact, motor grossly intact  DEVICE  Normal device function.  See PaceArt for details.   Assess/Plan: 1. PAF - she will remain a strategy of rate control. I tried to give her amiodarone to keep her in NSR but she stated it made her nervous. She will continue a beta blocker. She does have symptomatic palpitations and I have recommended we start digoxin, 0.125 mg daily, none on Sat/Sun. 2. Preoperative eval - she is pending EGD. She can stop plavix 5 days prior to the procedure and restart as soon as possible after the procedure. 3. PPM -her medtronic DDD PM is working normally.  4. CAD - She is s/p remote PCI. She denies angina even when her atrial fib is fast.  Carleene Overlie Tanda [place]sey,MD

## 2020-04-25 DIAGNOSIS — K219 Gastro-esophageal reflux disease without esophagitis: Secondary | ICD-10-CM | POA: Diagnosis not present

## 2020-04-25 DIAGNOSIS — I480 Paroxysmal atrial fibrillation: Secondary | ICD-10-CM | POA: Diagnosis not present

## 2020-04-25 DIAGNOSIS — I10 Essential (primary) hypertension: Secondary | ICD-10-CM | POA: Diagnosis not present

## 2020-04-25 DIAGNOSIS — E7849 Other hyperlipidemia: Secondary | ICD-10-CM | POA: Diagnosis not present

## 2020-04-25 NOTE — Patient Instructions (Signed)
[name]  04/25/2020     @PREFPERIOPPHARMACY @   Your procedure is scheduled on  05/02/2020.  [facility] to [facility] at  1220  P.M.  Call this number if you have problems the morning of surgery:  [phone]   Remember:  Follow the diet and prep instructions given to you by the office.                      Take these medicines the morning of surgery with A SIP OF WATER  Xanax(if needed), digoxin, nexium, avapro, metoprolol, zofran(if needed).    Do not wear jewelry, make-up or nail polish.  Do not wear lotions, powders, or perfumes. Please wear deodorant and brush your teeth.  Do not shave 48 hours prior to surgery.  Men may shave face and neck.  Do not bring valuables to the hospital.  [facility] is not responsible for any belongings or valuables.  Contacts, dentures or bridgework may not be worn into surgery.  Leave your suitcase in the car.  After surgery it may be brought to your room.  For patients admitted to the hospital, discharge time will be determined by your treatment team.  Patients discharged the day of surgery will not be allowed to drive home.   Name and phone number of your driver:   family Special instructions:  DO NOT smoke the morning of your procedure.  Please read over the following fact sheets that you were given. Anesthesia Post-op Instructions and Care and Recovery After Surgery       Upper Endoscopy, Adult, Care After This sheet gives you information about how to care for yourself after your procedure. Your health care provider may also give you more specific instructions. If you have problems or questions, contact your health care provider. What can I expect after the procedure? After the procedure, it is common to have:  A sore throat.  Mild stomach pain or discomfort.  Bloating.  Nausea. Follow these instructions at home:   Follow instructions from your health care provider about what to eat or drink after your  procedure.  Return to your normal activities as told by your health care provider. Ask your health care provider what activities are safe for you.  Take over-the-counter and prescription medicines only as told by your health care provider.  Do not drive for 24 hours if you were given a sedative during your procedure.  Keep all follow-up visits as told by your health care provider. This is important. Contact a health care provider if you have:  A sore throat that lasts longer than one day.  Trouble swallowing. Get help right away if:  You vomit blood or your vomit looks like coffee grounds.  You have: ? A fever. ? Bloody, black, or tarry stools. ? A severe sore throat or you cannot swallow. ? Difficulty breathing. ? Severe pain in your chest or abdomen. Summary  After the procedure, it is common to have a sore throat, mild stomach discomfort, bloating, and nausea.  Do not drive for 24 hours if you were given a sedative during the procedure.  Follow instructions from your health care provider about what to eat or drink after your procedure.  Return to your normal activities as told by your health care provider. This information is not intended to replace advice given to you by your health care provider. Make sure you discuss any questions you have with  your health care provider. Document Revised: 01/12/2018 Document Reviewed: 12/21/2017 Elsevier Patient Education  [place].  Esophageal Dilatation Esophageal dilatation, also called esophageal dilation, is a procedure to widen or open (dilate) a blocked or narrowed part of the esophagus. The esophagus is the part of the body that moves food and liquid from the mouth to the stomach. You may need this procedure if:  You have a buildup of scar tissue in your esophagus that makes it difficult, painful, or impossible to swallow. This can be caused by gastroesophageal reflux disease (GERD).  You have cancer of the  esophagus.  There is a problem with how food moves through your esophagus. In some cases, you may need this procedure repeated at a later time to dilate the esophagus gradually. Tell a health care provider about:  Any allergies you have.  All medicines you are taking, including vitamins, herbs, eye drops, creams, and over-the-counter medicines.  Any problems you or family members have had with anesthetic medicines.  Any blood disorders you have.  Any surgeries you have had.  Any medical conditions you have.  Any antibiotic medicines you are required to take before dental procedures.  Whether you are pregnant or may be pregnant. What are the risks? Generally, this is a safe procedure. However, problems may occur, including:  Bleeding due to a tear in the lining of the esophagus.  A hole (perforation) in the esophagus. What happens before the procedure?  Follow instructions from your health care provider about eating or drinking restrictions.  Ask your health care provider about changing or stopping your regular medicines. This is especially important if you are taking diabetes medicines or blood thinners.  Plan to have someone take you home from the hospital or clinic.  Plan to have a responsible adult care for you for at least 24 hours after you leave the hospital or clinic. This is important. What happens during the procedure?  You may be given a medicine to help you relax (sedative).  A numbing medicine may be sprayed into the back of your throat, or you may gargle the medicine.  Your health care provider may perform the dilatation using various surgical instruments, such as: ? Simple dilators. This instrument is carefully placed in the esophagus to stretch it. ? Guided wire bougies. This involves using an endoscope to insert a wire into the esophagus. A dilator is passed over this wire to enlarge the esophagus. Then the wire is removed. ? Balloon dilators. An endoscope  with a small balloon at the end is inserted into the esophagus. The balloon is inflated to stretch the esophagus and open it up. The procedure may vary among health care providers and hospitals. What happens after the procedure?  Your blood pressure, heart rate, breathing rate, and blood oxygen level will be monitored until the medicines you were given have worn off.  Your throat may feel slightly sore and numb. This will improve slowly over time.  You will not be allowed to eat or drink until your throat is no longer numb.  When you are able to drink, urinate, and sit on the edge of the bed without nausea or dizziness, you may be able to return home. Follow these instructions at home:  Take over-the-counter and prescription medicines only as told by your health care provider.  Do not drive for 24 hours if you were given a sedative during your procedure.  You should have a responsible adult with you for 24 hours after  the procedure.  Follow instructions from your health care provider about any eating or drinking restrictions.  Do not use any products that contain nicotine or tobacco, such as cigarettes and e-cigarettes. If you need help quitting, ask your health care provider.  Keep all follow-up visits as told by your health care provider. This is important. Get help right away if you:  Have a fever.  Have chest pain.  Have pain that is not relieved by medication.  Have trouble breathing.  Have trouble swallowing.  Vomit blood. Summary  Esophageal dilatation, also called esophageal dilation, is a procedure to widen or open (dilate) a blocked or narrowed part of the esophagus.  Plan to have someone take you home from the hospital or clinic.  For this procedure, a numbing medicine may be sprayed into the back of your throat, or you may gargle the medicine.  Do not drive for 24 hours if you were given a sedative during your procedure. This information is not intended to  replace advice given to you by your health care provider. Make sure you discuss any questions you have with your health care provider. Document Revised: 05/18/2019 Document Reviewed: 05/26/2017 Elsevier Patient Education  2020 [place] After These instructions provide you with information about caring for yourself after your procedure. Your health care provider may also give you more specific instructions. Your treatment has been planned according to current medical practices, but problems sometimes occur. Call your health care provider if you have any problems or questions after your procedure. What can I expect after the procedure? After your procedure, you may:  Feel sleepy for several hours.  Feel clumsy and have poor balance for several hours.  Feel forgetful about what happened after the procedure.  Have poor judgment for several hours.  Feel nauseous or vomit.  Have a sore throat if you had a breathing tube during the procedure. Follow these instructions at home: For at least 24 hours after the procedure:      Have a responsible adult stay with you. It is important to have someone help care for you until you are awake and alert.  Rest as needed.  Do not: ? Participate in activities in which you could fall or become injured. ? Drive. ? Use heavy machinery. ? Drink alcohol. ? Take sleeping pills or medicines that cause drowsiness. ? Make important decisions or sign legal documents. ? Take care of children on your own. Eating and drinking  Follow the diet that is recommended by your health care provider.  If you vomit, drink water, juice, or soup when you can drink without vomiting.  Make sure you have little or no nausea before eating solid foods. General instructions  Take over-the-counter and prescription medicines only as told by your health care provider.  If you have sleep apnea, surgery and certain medicines can increase  your risk for breathing problems. Follow instructions from your health care provider about wearing your sleep device: ? Anytime you are sleeping, including during daytime naps. ? While taking prescription pain medicines, sleeping medicines, or medicines that make you drowsy.  If you smoke, do not smoke without supervision.  Keep all follow-up visits as told by your health care provider. This is important. Contact a health care provider if:  You keep feeling nauseous or you keep vomiting.  You feel light-headed.  You develop a rash.  You have a fever. Get help right away if:  You have trouble breathing. Summary  For several hours after your procedure, you may feel sleepy and have poor judgment.  Have a responsible adult stay with you for at least 24 hours or until you are awake and alert. This information is not intended to replace advice given to you by your health care provider. Make sure you discuss any questions you have with your health care provider. Document Revised: 10/19/2017 Document Reviewed: 11/11/2015 Elsevier Patient Education  [place].

## 2020-04-29 DIAGNOSIS — R0602 Shortness of breath: Secondary | ICD-10-CM | POA: Diagnosis not present

## 2020-04-29 DIAGNOSIS — I2510 Atherosclerotic heart disease of native coronary artery without angina pectoris: Secondary | ICD-10-CM | POA: Diagnosis not present

## 2020-05-01 ENCOUNTER — Other Ambulatory Visit: Payer: Self-pay

## 2020-05-01 ENCOUNTER — Encounter (HOSPITAL_COMMUNITY): Payer: Self-pay

## 2020-05-01 ENCOUNTER — Encounter (HOSPITAL_COMMUNITY)
Admission: RE | Admit: 2020-05-01 | Discharge: 2020-05-01 | Disposition: A | Discharge status: Home / Self Care | Payer: Medicare Other | Source: Ambulatory Visit | Attending: Internal Medicine | Admitting: Internal Medicine

## 2020-05-01 ENCOUNTER — Other Ambulatory Visit (HOSPITAL_COMMUNITY)
Admission: RE | Admit: 2020-05-01 | Discharge: 2020-05-01 | Disposition: A | Discharge status: Home / Self Care | Payer: Medicare Other | Source: Ambulatory Visit | Attending: Internal Medicine | Admitting: Internal Medicine

## 2020-05-01 DIAGNOSIS — Z01818 Encounter for other preprocedural examination: Secondary | ICD-10-CM | POA: Diagnosis not present

## 2020-05-01 DIAGNOSIS — Z20822 Contact with and (suspected) exposure to covid-19: Secondary | ICD-10-CM | POA: Diagnosis not present

## 2020-05-01 DIAGNOSIS — I10 Essential (primary) hypertension: Secondary | ICD-10-CM | POA: Diagnosis not present

## 2020-05-01 LAB — CBC WITH DIFFERENTIAL/PLATELET
Abs Immature Granulocytes: 0.01 10*3/uL (ref 0.00–0.07)
Basophils Absolute: 0 10*3/uL (ref 0.0–0.1)
Basophils Relative: 1 %
Eosinophils Absolute: 0.1 10*3/uL (ref 0.0–0.5)
Eosinophils Relative: 1 %
HCT: 42.6 % (ref 36.0–46.0)
Hemoglobin: 13.1 g/dL (ref 12.0–15.0)
Immature Granulocytes: 0 %
Lymphocytes Relative: 33 %
Lymphs Abs: 2.2 10*3/uL (ref 0.7–4.0)
MCH: 29.7 pg (ref 26.0–34.0)
MCHC: 30.8 g/dL (ref 30.0–36.0)
MCV: 96.6 fL (ref 80.0–100.0)
Monocytes Absolute: 0.7 10*3/uL (ref 0.1–1.0)
Monocytes Relative: 10 %
Neutro Abs: 3.6 10*3/uL (ref 1.7–7.7)
Neutrophils Relative %: 55 %
Platelets: 211 10*3/uL (ref 150–400)
RBC: 4.41 MIL/uL (ref 3.87–5.11)
RDW: 12.9 % (ref 11.5–15.5)
WBC: 6.6 10*3/uL (ref 4.0–10.5)
nRBC: 0 % (ref 0.0–0.2)

## 2020-05-01 LAB — BASIC METABOLIC PANEL
Anion gap: 10 (ref 5–15)
BUN: 8 mg/dL (ref 8–23)
CO2: 27 mmol/L (ref 22–32)
Calcium: 10 mg/dL (ref 8.9–10.3)
Chloride: 103 mmol/L (ref 98–111)
Creatinine, Ser: 0.52 mg/dL (ref 0.44–1.00)
GFR calc Af Amer: >60 mL/min (ref >60)
GFR calc non Af Amer: >60 mL/min (ref >60)
Glucose, Bld: 89 mg/dL (ref 70–99)
Potassium: 4 mmol/L (ref 3.5–5.1)
Sodium: 140 mmol/L (ref 135–145)

## 2020-05-01 LAB — SARS CORONAVIRUS 2 (TAT 6-24 HRS): SARS Coronavirus 2: NEGATIVE

## 2020-05-02 ENCOUNTER — Ambulatory Visit (HOSPITAL_COMMUNITY): Payer: Medicare Other | Admitting: Anesthesiology

## 2020-05-02 ENCOUNTER — Ambulatory Visit (HOSPITAL_COMMUNITY)
Admission: RE | Admit: 2020-05-02 | Discharge: 2020-05-02 | Disposition: A | Discharge status: Home / Self Care | Payer: Medicare Other | Attending: Internal Medicine | Admitting: Internal Medicine

## 2020-05-02 ENCOUNTER — Encounter (HOSPITAL_COMMUNITY): Payer: Self-pay | Admitting: Internal Medicine

## 2020-05-02 ENCOUNTER — Other Ambulatory Visit: Payer: Self-pay

## 2020-05-02 ENCOUNTER — Encounter (HOSPITAL_COMMUNITY)
Admission: RE | Disposition: A | Discharge status: Home / Self Care | Payer: Self-pay | Source: Home / Self Care | Attending: Internal Medicine

## 2020-05-02 DIAGNOSIS — R1314 Dysphagia, pharyngoesophageal phase: Secondary | ICD-10-CM | POA: Insufficient documentation

## 2020-05-02 DIAGNOSIS — Z7902 Long term (current) use of antithrombotics/antiplatelets: Secondary | ICD-10-CM | POA: Insufficient documentation

## 2020-05-02 DIAGNOSIS — F419 Anxiety disorder, unspecified: Secondary | ICD-10-CM | POA: Diagnosis not present

## 2020-05-02 DIAGNOSIS — K219 Gastro-esophageal reflux disease without esophagitis: Secondary | ICD-10-CM | POA: Insufficient documentation

## 2020-05-02 DIAGNOSIS — Z955 Presence of coronary angioplasty implant and graft: Secondary | ICD-10-CM | POA: Diagnosis not present

## 2020-05-02 DIAGNOSIS — E785 Hyperlipidemia, unspecified: Secondary | ICD-10-CM | POA: Insufficient documentation

## 2020-05-02 DIAGNOSIS — Z882 Allergy status to sulfonamides status: Secondary | ICD-10-CM | POA: Insufficient documentation

## 2020-05-02 DIAGNOSIS — I10 Essential (primary) hypertension: Secondary | ICD-10-CM | POA: Diagnosis not present

## 2020-05-02 DIAGNOSIS — Z90710 Acquired absence of both cervix and uterus: Secondary | ICD-10-CM | POA: Diagnosis not present

## 2020-05-02 DIAGNOSIS — K449 Diaphragmatic hernia without obstruction or gangrene: Secondary | ICD-10-CM | POA: Diagnosis not present

## 2020-05-02 DIAGNOSIS — G709 Myoneural disorder, unspecified: Secondary | ICD-10-CM | POA: Diagnosis not present

## 2020-05-02 DIAGNOSIS — J449 Chronic obstructive pulmonary disease, unspecified: Secondary | ICD-10-CM | POA: Insufficient documentation

## 2020-05-02 DIAGNOSIS — Z9049 Acquired absence of other specified parts of digestive tract: Secondary | ICD-10-CM | POA: Diagnosis not present

## 2020-05-02 DIAGNOSIS — Z8249 Family history of ischemic heart disease and other diseases of the circulatory system: Secondary | ICD-10-CM | POA: Insufficient documentation

## 2020-05-02 DIAGNOSIS — Z79899 Other long term (current) drug therapy: Secondary | ICD-10-CM | POA: Insufficient documentation

## 2020-05-02 DIAGNOSIS — I4891 Unspecified atrial fibrillation: Secondary | ICD-10-CM | POA: Insufficient documentation

## 2020-05-02 DIAGNOSIS — I2510 Atherosclerotic heart disease of native coronary artery without angina pectoris: Secondary | ICD-10-CM | POA: Diagnosis not present

## 2020-05-02 DIAGNOSIS — H9190 Unspecified hearing loss, unspecified ear: Secondary | ICD-10-CM | POA: Insufficient documentation

## 2020-05-02 DIAGNOSIS — K222 Esophageal obstruction: Secondary | ICD-10-CM | POA: Insufficient documentation

## 2020-05-02 DIAGNOSIS — Z888 Allergy status to other drugs, medicaments and biological substances status: Secondary | ICD-10-CM | POA: Insufficient documentation

## 2020-05-02 DIAGNOSIS — Z950 Presence of cardiac pacemaker: Secondary | ICD-10-CM | POA: Diagnosis not present

## 2020-05-02 DIAGNOSIS — K2970 Gastritis, unspecified, without bleeding: Secondary | ICD-10-CM | POA: Insufficient documentation

## 2020-05-02 DIAGNOSIS — Z87891 Personal history of nicotine dependence: Secondary | ICD-10-CM | POA: Diagnosis not present

## 2020-05-02 HISTORY — PX: ESOPHAGOGASTRODUODENOSCOPY (EGD) WITH PROPOFOL: SHX5813

## 2020-05-02 HISTORY — PX: ESOPHAGEAL DILATION: SHX303

## 2020-05-02 SURGERY — ESOPHAGOGASTRODUODENOSCOPY (EGD) WITH PROPOFOL
Anesthesia: General

## 2020-05-02 MED ORDER — PROPOFOL 10 MG/ML IV BOLUS
INTRAVENOUS | Status: DC | PRN
  Administered 2020-05-02: 60 mg via INTRAVENOUS

## 2020-05-02 MED ORDER — LACTATED RINGERS IV SOLN: INTRAVENOUS | Status: DC | PRN

## 2020-05-02 MED ORDER — LACTATED RINGERS IV SOLN: Freq: Once | INTRAVENOUS | Status: AC

## 2020-05-02 MED ORDER — PROPOFOL 500 MG/50ML IV EMUL
INTRAVENOUS | Status: DC | PRN
  Administered 2020-05-02: 100 ug/kg/min via INTRAVENOUS

## 2020-05-02 MED ORDER — LIDOCAINE VISCOUS HCL 2 % MT SOLN
OROMUCOSAL | Status: AC
  Filled 2020-05-02: qty 15

## 2020-05-02 MED ORDER — LIDOCAINE VISCOUS HCL 2 % MT SOLN
15.0000 mL | Freq: Once | OROMUCOSAL | Status: AC
  Administered 2020-05-02: 15 mL via OROMUCOSAL

## 2020-05-02 MED ORDER — STERILE WATER FOR IRRIGATION IR SOLN
Status: DC | PRN
  Administered 2020-05-02: 2.5 mL

## 2020-05-02 MED ORDER — LIDOCAINE HCL (CARDIAC) PF 100 MG/5ML IV SOSY
PREFILLED_SYRINGE | INTRAVENOUS | Status: DC | PRN
  Administered 2020-05-02: 50 mg via INTRAVENOUS

## 2020-05-02 NOTE — Op Note (Signed)
[facility] Patient Name: [name] Procedure Date: 05/02/2020 2:26 PM MRN: [id number] Date of Birth: [birth year] Attending MD: Hildred Laser , MD CSN: [id number] Age: 84 Admit Type: Outpatient Procedure:                Upper GI endoscopy Indications:              Esophageal dysphagia Providers:                Hildred Laser, MD, Lurline Del, RN, Kristine L.                            Risa Grill, Technician, Raphael Gibney, Technician Referring MD:             Delphina Cahill, MD Medicines:                Propofol per Anesthesia Complications:            No immediate complications. Estimated Blood Loss:     Estimated blood loss was minimal. Procedure:                Pre-Anesthesia Assessment:                           - Prior to the procedure, a History and Physical                            was performed, and patient medications and                            allergies were reviewed. The patient's tolerance of                            previous anesthesia was also reviewed. The risks                            and benefits of the procedure and the sedation                            options and risks were discussed with the patient.                            All questions were answered, and informed consent                            was obtained. Prior Anticoagulants: The patient                            last took Plavix (clopidogrel) 5 days prior to the                            procedure. ASA Grade Assessment: III - A patient                            with severe systemic disease. After reviewing the  risks and benefits, the patient was deemed in                            satisfactory condition to undergo the procedure.                           After obtaining informed consent, the endoscope was                            passed under direct vision. Throughout the                            procedure, the patient's blood pressure, pulse, and                             oxygen saturations were monitored continuously. The                            GIF-H190 ([id number]) scope was introduced through the                            mouth, and advanced to the second part of duodenum.                            The upper GI endoscopy was accomplished without                            difficulty. The patient tolerated the procedure                            well. Scope In: 2:39:22 PM Scope Out: 2:45:59 PM Total Procedure Duration: 0 hours 6 minutes 37 seconds  Findings:      The hypopharynx was normal.      A non-obstructing Schatzki ring was found at the gastroesophageal       junction.      A 3 cm hiatal hernia was present.      No endoscopic abnormality was evident in the esophagus to explain the       patient's complaint of dysphagia. It was decided, however, to proceed       with dilation of the entire esophagus. The scope was withdrawn. Dilation       was performed with a Maloney dilator with mild resistance at 102 Fr. The       dilation site was examined following endoscope reinsertion and showed       mild mucosal disruptionjust below UES suggestive os esophageal web.       There was moderate improvement in luminal narrowing and no perforation.      Patchy mild inflammation characterized by congestion (edema) and       erythema was found in the gastric antrum.      The exam of the stomach was otherwise normal.      The duodenal bulb and second portion of the duodenum were normal. Impression:               - Normal hypopharynx.                           -  Non-obstructing Schatzki ring.                           - 3 cm hiatal hernia.                           - No endoscopic esophageal abnormality to explain                            patient's dysphagia. Esophagus dilated. Dilation                            resulted in mucosal disruption at proximal                            esophagus suggestive of disrupted web.                            - Gastritis.                           - Normal duodenal bulb and second portion of the                            duodenum.                           - No specimens collected. Moderate Sedation:      Per Anesthesia Care Recommendation:           - Patient has a contact number available for                            emergencies. The signs and symptoms of potential                            delayed complications were discussed with the                            patient. Return to normal activities tomorrow.                            Written discharge instructions were provided to the                            patient.                           - Mechanical soft diet today.                           - Continue present medications.                           - Resume Plavix (clopidogrel) at prior dose in 2  days.                           - Return to GI clinic in 1 week. Procedure Code(s):        --- Professional ---                           [phone], Esophagogastroduodenoscopy, flexible,                            transoral; diagnostic, including collection of                            specimen(s) by brushing or washing, when performed                            (separate procedure)                           43450, Dilation of esophagus, by unguided sound or                            bougie, single or multiple passes Diagnosis Code(s):        --- Professional ---                           K22.2, Esophageal obstruction                           K44.9, Diaphragmatic hernia without obstruction or                            gangrene                           K29.70, Gastritis, unspecified, without bleeding                           R13.14, Dysphagia, pharyngoesophageal phase CPT copyright 2019 [employer]. All rights reserved. The codes documented in this report are preliminary and upon coder review may  be revised to meet current  compliance requirements. Hildred Laser, MD Hildred Laser, MD 05/02/2020 2:59:11 PM This report has been signed electronically. Number of Addenda: 0

## 2020-05-02 NOTE — Transfer of Care (Signed)
Immediate Anesthesia Transfer of Care Note  Patient: [name]  Procedure(s) Performed: ESOPHAGOGASTRODUODENOSCOPY (EGD) WITH PROPOFOL (N/A ) ESOPHAGEAL DILATION (N/A )  Patient Location: PACU  Anesthesia Type:General  Level of Consciousness: awake, alert  and oriented  Airway & Oxygen Therapy: Patient Spontanous Breathing  Post-op Assessment: Report given to RN, Post -op Vital signs reviewed and stable and Patient moving all extremities X 4  Post vital signs: Reviewed and stable  Last Vitals:  Vitals Value Taken Time  BP    Temp    Pulse 39 05/02/20 1453  Resp 16 05/02/20 1453  SpO2 93 % 05/02/20 1453  Vitals shown include unvalidated device data.  Last Pain:  Vitals:   05/02/20 1258  TempSrc: Oral  PainSc: 0-No pain         Complications: No complications documented.

## 2020-05-02 NOTE — Anesthesia Postprocedure Evaluation (Signed)
Anesthesia Post Note  Patient: [name]  Procedure(s) Performed: ESOPHAGOGASTRODUODENOSCOPY (EGD) WITH PROPOFOL (N/A ) ESOPHAGEAL DILATION (N/A )  Patient location during evaluation: PACU Anesthesia Type: General Level of consciousness: awake and alert and oriented Pain management: pain level controlled Vital Signs Assessment: post-procedure vital signs reviewed and stable Respiratory status: spontaneous breathing, nonlabored ventilation and respiratory function stable Cardiovascular status: blood pressure returned to baseline Postop Assessment: no apparent nausea or vomiting Anesthetic complications: no   No complications documented.   Last Vitals:  Vitals:   05/02/20 1453 05/02/20 1500  BP: (!) 145/80 (P) 130/77  Pulse: (!) 39 (P) 79  Resp: 16 (!) 23  Temp: 36.8 C   SpO2: 93% 94%    Last Pain:  Vitals:   05/02/20 1258  TempSrc: Oral  PainSc: 0-No pain                 Orlie Dakin

## 2020-05-02 NOTE — Discharge Instructions (Signed)
Resume diabetic/clopidogrel on 05/04/2020 Resume other medications as before. Mechanical soft diet. No driving for 24 hours. Please call office with progress report in 1 week.       Upper Endoscopy, Adult, Care After This sheet gives you information about how to care for yourself after your procedure. Your health care provider may also give you more specific instructions. If you have problems or questions, contact your health care provider. What can I expect after the procedure? After the procedure, it is common to have:  A sore throat.  Mild stomach pain or discomfort.  Bloating.  Nausea. Follow these instructions at home:   Follow instructions from your health care provider about what to eat or drink after your procedure.  Return to your normal activities as told by your health care provider. Ask your health care provider what activities are safe for you.  Take over-the-counter and prescription medicines only as told by your health care provider.  Do not drive for 24 hours if you were given a sedative during your procedure.  Keep all follow-up visits as told by your health care provider. This is important. Contact a health care provider if you have:  A sore throat that lasts longer than one day.  Trouble swallowing. Get help right away if:  You vomit blood or your vomit looks like coffee grounds.  You have: ? A fever. ? Bloody, black, or tarry stools. ? A severe sore throat or you cannot swallow. ? Difficulty breathing. ? Severe pain in your chest or abdomen. Summary  After the procedure, it is common to have a sore throat, mild stomach discomfort, bloating, and nausea.  Do not drive for 24 hours if you were given a sedative during the procedure.  Follow instructions from your health care provider about what to eat or drink after your procedure.  Return to your normal activities as told by your health care provider. This information is not intended to  replace advice given to you by your health care provider. Make sure you discuss any questions you have with your health care provider. Document Revised: 01/12/2018 Document Reviewed: 12/21/2017 Elsevier Patient Education  [place] A soft-food eating plan includes foods that are safe and easy to chew and swallow. Your health care provider or dietitian can help you find foods and flavors that fit into this plan. Follow this plan until your health care provider or dietitian says it is safe to start eating other foods and food textures. What are tips for following this plan? General guidelines   Take small bites of food, or cut food into pieces about  inch or smaller. Bite-sized pieces of food are easier to chew and swallow.  Eat moist foods. Avoid overly dry foods.  Avoid foods that: ? Are difficult to swallow, such as dry, chunky, crispy, or sticky foods. ? Are difficult to chew, such as hard, tough, or stringy foods. ? Contain nuts, seeds, or fruits.  Follow instructions from your dietitian about the types of liquids that are safe for you to swallow. You may be allowed to have: ? Thick liquids only. This includes only liquids that are thicker than honey. ? Thin and thick liquids. This includes all beverages and foods that become liquid at room temperature.  To make thick liquids: ? Purchase a commercial liquid thickening powder. These are available at grocery stores and pharmacies. ? Mix the thickener into liquids according to instructions on the label. ? Purchase ready-made thickened  liquids. ? Thicken soup by pureeing, straining to remove chunks, and adding flour, potato flakes, or corn starch. ? Add commercial thickener to foods that become liquid at room temperature, such as milk shakes, yogurt, ice cream, gelatin, and sherbet.  Ask your health care provider whether you need to take a fiber supplement. Cooking  Cook meats so they stay tender  and moist. Use methods like braising, stewing, or baking in liquid.  Cook vegetables and fruit until they are soft enough to be mashed with a fork.  Peel soft, fresh fruits such as peaches, nectarines, and melons.  When making soup, make sure chunks of meat and vegetables are smaller than  inch.  Reheat leftover foods slowly so that a tough crust does not form. What foods are allowed? The items listed below may not be a complete list. Talk with your dietitian about what dietary choices are best for you. Grains Breads, muffins, pancakes, or waffles moistened with syrup, jelly, or butter. Dry cereals well-moistened with milk. Moist, cooked cereals. Well-cooked pasta and rice. Vegetables All soft-cooked vegetables. Shredded lettuce. Fruits All canned and cooked fruits. Soft, peeled fresh fruits. Strawberries. Dairy Milk. Cream. Yogurt. Cottage cheese. Soft cheese without the rind. Meats and other protein foods Tender, moist ground meat, poultry, or fish. Meat cooked in gravy or sauces. Eggs. Sweets and desserts Ice cream. Milk shakes. Sherbet. Pudding. Fats and oils Butter. Margarine. Olive, canola, sunflower, and grapeseed oil. Smooth salad dressing. Smooth cream cheese. Mayonnaise. Gravy. What foods are not allowed? The items listed bemay not be a complete list. Talk with your dietitian about what dietary choices are best for you. Grains Coarse or dry cereals, such as bran, granola, and shredded wheat. Tough or chewy crusty breads, such as [place] bread or baguettes. Breads with nuts, seeds, or fruit. Vegetables All raw vegetables. Cooked corn. Cooked vegetables that are tough or stringy. Tough, crisp, fried potatoes and potato skins. Fruits Fresh fruits with skins or seeds, or both, such as apples, pears, and grapes. Stringy, high-pulp fruits, such as papaya, pineapple, coconut, and mango. Fruit leather and all dried fruit. Dairy Yogurt with nuts or coconut. Meats and other protein  foods Hard, dry sausages. Dry meat, poultry, or fish. Meats with gristle. Fish with bones. Fried meat or fish. Lunch meat and hotdogs. Nuts and seeds. Chunky peanut butter or other nut butters. Sweets and desserts Cakes or cookies that are very dry or chewy. Desserts with dried fruit, nuts, or coconut. Fried pastries. Very rich pastries. Fats and oils Cream cheese with fruit or nuts. Salad dressings with seeds or chunks. Summary  A soft-food eating plan includes foods that are safe and easy to swallow. Generally, the foods should be soft enough to be mashed with a fork.  Avoid foods that are dry, hard to chew, crunchy, sticky, stringy, or crispy.  Ask your health care provider whether you need to thicken your liquids and if you need to take a fiber supplement. This information is not intended to replace advice given to you by your health care provider. Make sure you discuss any questions you have with your health care provider. Document Revised: 11/11/2018 Document Reviewed: 09/23/2016 Elsevier Patient Education  2020 [place] After These instructions provide you with information about caring for yourself after your procedure. Your health care provider may also give you more specific instructions. Your treatment has been planned according to current medical practices, but problems sometimes occur. Call your health  care provider if you have any problems or questions after your procedure. What can I expect after the procedure? After your procedure, you may:  Feel sleepy for several hours.  Feel clumsy and have poor balance for several hours.  Feel forgetful about what happened after the procedure.  Have poor judgment for several hours.  Feel nauseous or vomit.  Have a sore throat if you had a breathing tube during the procedure. Follow these instructions at home: For at least 24 hours after the procedure:      Have a responsible adult  stay with you. It is important to have someone help care for you until you are awake and alert.  Rest as needed.  Do not: ? Participate in activities in which you could fall or become injured. ? Drive. ? Use heavy machinery. ? Drink alcohol. ? Take sleeping pills or medicines that cause drowsiness. ? Make important decisions or sign legal documents. ? Take care of children on your own. Eating and drinking  Follow the diet that is recommended by your health care provider.  If you vomit, drink water, juice, or soup when you can drink without vomiting.  Make sure you have little or no nausea before eating solid foods. General instructions  Take over-the-counter and prescription medicines only as told by your health care provider.  If you have sleep apnea, surgery and certain medicines can increase your risk for breathing problems. Follow instructions from your health care provider about wearing your sleep device: ? Anytime you are sleeping, including during daytime naps. ? While taking prescription pain medicines, sleeping medicines, or medicines that make you drowsy.  If you smoke, do not smoke without supervision.  Keep all follow-up visits as told by your health care provider. This is important. Contact a health care provider if:  You keep feeling nauseous or you keep vomiting.  You feel light-headed.  You develop a rash.  You have a fever. Get help right away if:  You have trouble breathing. Summary  For several hours after your procedure, you may feel sleepy and have poor judgment.  Have a responsible adult stay with you for at least 24 hours or until you are awake and alert. This information is not intended to replace advice given to you by your health care provider. Make sure you discuss any questions you have with your health care provider. Document Revised: 10/19/2017 Document Reviewed: 11/11/2015 Elsevier Patient Education  [place].

## 2020-05-02 NOTE — H&P (Addendum)
[name] is an 84 y.o. female.   Chief Complaint: Patient here for esophagogastroduodenoscopy with esophageal dilation. HPI: Patient is 84 year old Caucasian female who has chronic GERD as well as history of Schatzki's ring and esophageal motility disorder who presents with worsening dysphagia to solids.  She states she lost her weight down to 140 pounds and then gained it back to 148 pounds.  She believes she has lost net of 20 pounds this year.  She has been drinking Ensure daily which has helped.  She denies nausea vomiting abdominal pain or melena. Last EGD with ED was 5 years ago. She has been off Plavix for 5 days.   Past Medical History:  Diagnosis Date  . Anemia   . Anxiety   . Atrial fibrillation   . Chronic back pain   . COPD (chronic obstructive pulmonary disease) ([place])   . Coronary atherosclerosis of native coronary artery    DES x 2 to RCA 10/10  . 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 Essential hypertension   . GERD (gastroesophageal reflux disease)   . H/O hiatal hernia   . Headache(784.0)   . HOH (hard of hearing)   . Hyperlipidemia   . Neuromuscular disorder ([place])    Tremors  . Pericardial effusion    Hemorrhagic   . Presence of permanent cardiac pacemaker   . Tachycardia-bradycardia syndrome ([place])    s/p Medtronic Adapta L dual chamber device  5/10    Past Surgical History:  Procedure Laterality Date  . APPENDECTOMY    . CARDIAC CATHETERIZATION  2010   stents x2.  Marland Kitchen CATARACT EXTRACTION    . CHOLECYSTECTOMY    . COLONOSCOPY  2011  . COLONOSCOPY N/A 10/19/2014   Procedure: COLONOSCOPY;  Surgeon: Rogene Houston, MD;  [facility] ENDO [facility];  Service: Endoscopy;  Laterality: N/A;  1030  . ESOPHAGOGASTRODUODENOSCOPY N/A 10/26/2014   Procedure: ESOPHAGOGASTRODUODENOSCOPY (EGD);  Surgeon: Rogene Houston, MD;  Location: [facility] ENDO SU[facility];  Service: Endoscopy;  Laterality: N/A;  230 - Dr. has lunch and learn  . Esophagogastroduodenoscopy  with esophageal dilation  2004, 2006, 2007  . GIVENS CAPSULE STUDY N/A 10/31/2014   Procedure: GIVENS CAPSULE STUDY;  Surgeon: Rogene Houston, MD;  Location: [facility] ENDO SU[facility];  Service: Endoscopy;  Laterality: N/A;  730 -- pacemaker--needs monitoring--outpatient bed  . INSERT / REPLACE / REMOVE PACEMAKER  2010  . LEFT HEART CATHETERIZATION WITH CORONARY ANGIOGRAM N/A 11/17/2011   Procedure: LEFT HEART CATHETERIZATION WITH CORONARY ANGIOGRAM;  Surgeon: Sherren Mocha, MD;  Location: [facility] CATH LAB;  Service: Cardiovascular;  Laterality: N/A;  . LEFT HEART CATHETERIZATION WITH CORONARY ANGIOGRAM N/A 09/26/2014   Procedure: LEFT HEART CATHETERIZATION WITH CORONARY ANGIOGRAM;  Surgeon: Leonie Man, MD;  Location: [facility] CATH LAB;  Service: Cardiovascular;  Laterality: N/A;  . Right rotator cuff repair    . SHOULDER ACROMIOPLASTY Right 05/30/2015   Procedure: RIGHT SHOULDER ACROMIOPLASTY;  Surgeon: Carole Civil, MD;  Location: [facility] ORS;  Service: Orthopedics;  Laterality: Right;  . SHOULDER OPEN ROTATOR CUFF REPAIR Right 05/30/2015   Procedure: OPEN ROTATOR CUFF REPAIR RIGHT SHOULDER;  Surgeon: Carole Civil, MD;  Location: [facility] ORS;  Service: Orthopedics;  Laterality: Right;  . SHOULDER OPEN ROTATOR CUFF REPAIR Left 10/22/2016   Procedure: ROTATOR CUFF REPAIR SHOULDER OPEN;  Surgeon: Carole Civil, MD;  Location: [facility] ORS;  Service: Orthopedics;  Laterality: Left;  . Subxiphoid pericardial window  11/10  . VAGINAL HYSTERECTOMY  Family History  Problem Relation Age of Onset  . Cancer Mother        Colon   . Coronary artery disease Sister   . Coronary artery disease Brother   . Arthritis Other   . Lung disease Other   . Asthma Other    Social History:  reports that she quit smoking about 30 years ago. Her smoking use included cigarettes. She has a 20.00 pack-year smoking history. She has never used smokeless tobacco. She reports that she does not drink alcohol and does not use  drugs.  Allergies:  Allergies  Allergen Reactions  . Amitriptyline Hcl Other (See Comments)    Caused "jaws to twist and lock"  . Sulfonamide Derivatives Other (See Comments)    UNKNOWN REACTION    Medications Prior to Admission  Medication Sig Dispense Refill  . ALPRAZolam (XANAX) 1 MG tablet Take 1 mg by mouth 2 (two) times daily as needed for anxiety or sleep.     . clopidogrel (PLAVIX) 75 MG tablet Take 75 mg by mouth daily.     . Cyanocobalamin (VITAMIN B-12) 5000 MCG SUBL Take 5,000 mcg by mouth daily.    . diclofenac Sodium (VOLTAREN) 1 % GEL Apply 2-4 g topically every 6 (six) hours as needed (leg/knee pain.).     Marland Kitchen digoxin (LANOXIN) 0.125 MG tablet Take one tablet Daily and none on Saturday or Sunday 90 tablet 3  . esomeprazole (NEXIUM) 40 MG capsule Take 40 mg by mouth daily.     . folic acid (FOLVITE) 161 MCG tablet Take 400 mcg by mouth daily.    . furosemide (LASIX) 40 MG tablet Take 40 mg by mouth See admin instructions. Take 1 tablet (40 mg) by mouth (scheduled) in the morning, may take an additional dose if needed for swelling.    . irbesartan (AVAPRO) 150 MG tablet TAKE (1) TABLET BY MOUTH ONCE DAILY. (Patient taking differently: Take 150 mg by mouth daily at 2 PM. ) 90 tablet 3  . metoprolol succinate (TOPROL-XL) 25 MG 24 hr tablet TAKE 2 TABLETS BY MOUTH 2 TIMES DAILY. (Patient taking differently: Take 50 mg by mouth in the morning and at bedtime. ) 360 tablet 3  . Multiple Vitamin (MULTIVITAMIN WITH MINERALS) TABS tablet Take 1 tablet by mouth daily.    . nitroGLYCERIN (NITROSTAT) 0.4 MG SL tablet Place 0.4 mg under the tongue every 5 (five) minutes x 3 doses as needed for chest pain.     Marland Kitchen ondansetron (ZOFRAN) 4 MG tablet Take 4 mg by mouth every 6 (six) hours as needed for nausea or vomiting.     . potassium chloride SA (K-DUR,KLOR-CON) 20 MEQ tablet Take 20 mEq by mouth See admin instructions. Takes 1 tablet (20 meq) by mouth daily (scheduled) & a 2nd tablet only if  has to take a 2nd dose of Lasix    . primidone (MYSOLINE) 50 MG tablet Take 50 mg by mouth at bedtime.     Marland Kitchen PROAIR HFA 108 (90 Base) MCG/ACT inhaler Inhale 1 puff into the lungs every 4 (four) hours as needed for wheezing or shortness of breath.     . rosuvastatin (CRESTOR) 20 MG tablet Take 20 mg by mouth at bedtime.      Results for orders placed or performed during the hospital encounter of 05/01/20 (from the past 48 hour(s))  SARS CORONAVIRUS 2 (TAT 6-24 HRS) Nasopharyngeal Nasopharyngeal Swab     Status: None   Collection Time: 05/01/20 11:05 AM  Specimen: Nasopharyngeal Swab  Result Value Ref Range   SARS Coronavirus 2 NEGATIVE NEGATIVE    Comment: (NOTE) SARS-CoV-2 target nucleic acids are NOT DETECTED.  The SARS-CoV-2 RNA is generally detectable in upper and lower respiratory specimens during the acute phase of infection. Negative results do not preclude SARS-CoV-2 infection, do not rule out co-infections with other pathogens, and should not be used as the sole basis for treatment or other patient management decisions. Negative results must be combined with clinical observations, patient history, and epidemiological information. The expected result is Negative.  Fact Sheet for Patients: SugarRoll.be  Fact Sheet for Healthcare Providers: https://www.woods-mathews.com/  This test is not yet approved or cleared by the [place] FDA and  has been authorized for detection and/or diagnosis of SARS-CoV-2 by FDA under an Emergency Use Authorization (EUA). This EUA will remain  in effect (meaning this test can be used) for the duration of the COVID-19 declaration under Se ction 564(b)(1) of the Act, 21 U.S.C. section 360bbb-3(b)(1), unless the authorization is terminated or revoked sooner.  Performed at [facility], [place] [address] [place], [place] [address]   CBC with Differential/Platelet     Status: None   Collection  Time: 05/01/20 11:09 AM  Result Value Ref Range   WBC 6.6 4.0 - 10.5 K/uL   RBC 4.41 3.87 - 5.11 MIL/uL   Hemoglobin 13.1 12.0 - 15.0 g/dL   HCT 42.6 36 - 46 %   MCV 96.6 80.0 - 100.0 fL   MCH 29.7 26.0 - 34.0 pg   MCHC 30.8 30.0 - 36.0 g/dL   RDW 12.9 11.5 - 15.5 %   Platelets 211 150 - 400 K/uL   nRBC 0.0 0.0 - 0.2 %   Neutrophils Relative % 55 %   Neutro Abs 3.6 1.7 - 7.7 K/uL   Lymphocytes Relative 33 %   Lymphs Abs 2.2 0.7 - 4.0 K/uL   Monocytes Relative 10 %   Monocytes Absolute 0.7 0 - 1 K/uL   Eosinophils Relative 1 %   Eosinophils Absolute 0.1 0 - 0 K/uL   Basophils Relative 1 %   Basophils Absolute 0.0 0 - 0 K/uL   Immature Granulocytes 0 %   Abs Immature Granulocytes 0.01 0.00 - 0.07 K/uL    Comment: Performed at [facility], [address], [place], [place] [address]  Basic metabolic panel     Status: None   Collection Time: 05/01/20 11:09 AM  Result Value Ref Range   Sodium 140 135 - 145 mmol/L   Potassium 4.0 3.5 - 5.1 mmol/L   Chloride 103 98 - 111 mmol/L   CO2 27 22 - 32 mmol/L   Glucose, Bld 89 70 - 99 mg/dL    Comment: Glucose reference range applies only to samples taken after fasting for at least 8 hours.   BUN 8 8 - 23 mg/dL   Creatinine, Ser 0.52 0.44 - 1.00 mg/dL   Calcium 10.0 8.9 - 10.3 mg/dL   GFR calc non Af Amer >60 >60 mL/min   GFR calc Af Amer >60 >60 mL/min   Anion gap 10 5 - 15    Comment: Performed at [facility], [address], [place], [place] [address]   No results found.  Review of Systems  Blood pressure (!) 185/76, pulse 87, temperature 98.6 F (37 C), temperature source Oral, resp. rate 19, height 5' 5.05" (1.652 m), weight 67.1 kg, SpO2 97 %. Physical Exam HENT:     Mouth/Throat:  Mouth: Mucous membranes are moist.     Pharynx: Oropharynx is clear.     Comments: Patient is edentulous.  She does use dentures when she is eating. Eyes:     General: No scleral icterus.    Conjunctiva/sclera: Conjunctivae normal.   Cardiovascular:     Rate and Rhythm: Normal rate and regular rhythm.     Heart sounds: Normal heart sounds. No murmur heard.   Pulmonary:     Effort: Pulmonary effort is normal.     Breath sounds: Normal breath sounds.  Abdominal:     Comments: Abdomen is symmetrical with low midline scar.  On palpation is soft and nontender with no organomegaly or masses.  Musculoskeletal:        General: No swelling.  Skin:    General: Skin is warm and dry.  Neurological:     Mental Status: She is alert.     Comments: She has slight tremor to her head and both hands.      Assessment/Plan  Esophageal dysphagia. Esophagogastroduodenoscopy with esophageal dilation.  Hildred Laser, MD 05/02/2020, 2:25 PM

## 2020-05-02 NOTE — Anesthesia Preprocedure Evaluation (Addendum)
Anesthesia Evaluation  Patient identified by MRN, date of birth, ID band Patient awake    Reviewed: Allergy & Precautions, NPO status , Patient's Chart, lab work & pertinent test results, reviewed documented beta blocker date and time   Airway Mallampati: II  TM Distance: >3 FB Neck ROM: Full    Dental  (+) Upper Dentures   Pulmonary COPD,  COPD inhaler, former smoker,    Pulmonary exam normal breath sounds clear to auscultation       Cardiovascular Exercise Tolerance: Good hypertension, Pt. on medications and Pt. on home beta blockers + angina + CAD  + pacemaker  Rhythm:Irregular Rate:Normal     Neuro/Psych  Headaches, Anxiety  Neuromuscular disease    GI/Hepatic Neg liver ROS, hiatal hernia, GERD  Medicated,  Endo/Other  negative endocrine ROS  Renal/GU negative Renal ROS     Musculoskeletal negative musculoskeletal ROS (+)   Abdominal   Peds  Hematology  (+) anemia ,   Anesthesia Other Findings   Reproductive/Obstetrics negative OB ROS                           Anesthesia Physical Anesthesia Plan  ASA: III  Anesthesia Plan: General   Post-op Pain Management:    Induction: Intravenous  PONV Risk Score and Plan: TIVA  Airway Management Planned: Nasal Cannula and Natural Airway  Additional Equipment:   Intra-op Plan:   Post-operative Plan:   Informed Consent: I have reviewed the patients History and Physical, chart, labs and discussed the procedure including the risks, benefits and alternatives for the proposed anesthesia with the patient or authorized representative who has indicated his/her understanding and acceptance.       Plan Discussed with: CRNA and Surgeon  Anesthesia Plan Comments:        Anesthesia Quick Evaluation

## 2020-05-02 NOTE — Anesthesia Procedure Notes (Signed)
Date/Time: 05/02/2020 2:40 PM Performed by: Orlie Dakin, CRNA Pre-anesthesia Checklist: Patient identified, Emergency Drugs available, Suction available and Patient being monitored Patient Re-evaluated:Patient Re-evaluated prior to induction Oxygen Delivery Method: Nasal cannula Induction Type: IV induction Placement Confirmation: positive ETCO2

## 2020-05-07 ENCOUNTER — Other Ambulatory Visit: Payer: Self-pay | Admitting: *Deleted

## 2020-05-07 ENCOUNTER — Encounter: Payer: Self-pay | Admitting: *Deleted

## 2020-05-07 NOTE — Patient Outreach (Deleted)
[facility]) Care Management  05/07/2020  LADON HENEY [birth year] [id number]  Unsuccessful outreach attempt made to patient. Patient picked up the phone and stated that she is not able to speak with this nurse today.  Plan: RN Health Coach will call patient within the month of November.  Emelia Loron RN, BSN [place] [phone] [email or web address]

## 2020-05-07 NOTE — Patient Outreach (Signed)
[facility]) Care Management  [facility] Care Manager  05/07/2020   [name] 1936/06/21 [id number]  Subjective: Successful telephone outreach call to patient. HIPAA identifiers obtained. Patient states she is trying to regain her strength after having a EGD with dilation procedure on 05/02/20. She reports that her throat remains sore and she is continuing to drink Ensure as her main nutrition and she is trying to drink a lot of water to stay hydrated. Patient hopes to begin to eat more solid foods soon and states she lost down to 140 pounds but has gained 8 pounds back by supplementing with Ensure. Nurse will send her some Ensure coupons. Patient reports she is independent with her ADLs and she gets assistance with IADLs from her niece Kathie Rhodes, her pastor, and church family. She denies having any recent falls or needs for DME and states that her home environment is safe.   Patient states she does not have a B/P cuff or scale to monitor her hypertension, Afib, or CHF. Nurse discussed the importance of daily B/P, pulse, and weights and will send patient a B/P cuff, scale, and education in the mail. Nurse instructed the patient to call her when she receives the devices in order to review and get clear instruction of how to weigh, take her B/P, record the values, and when to call the doctor if symptoms or B/P is not controlled. Patient verbalized understanding and she did write down this nurse's contact number.   Encounter Medications:  Outpatient Encounter Medications as of 05/07/2020  Medication Sig Note  . ALPRAZolam (XANAX) 1 MG tablet Take 1 mg by mouth 2 (two) times daily as needed for anxiety or sleep.  05/02/2020: Took 1/2 tablet  . clopidogrel (PLAVIX) 75 MG tablet Take 1 tablet (75 mg total) by mouth daily.   . Cyanocobalamin (VITAMIN B-12) 5000 MCG SUBL Take 5,000 mcg by mouth daily.   . diclofenac Sodium (VOLTAREN) 1 % GEL Apply 2-4 g topically every 6 (six) hours as needed  (leg/knee pain.).    Marland Kitchen digoxin (LANOXIN) 0.125 MG tablet Take one tablet Daily and none on Saturday or Sunday   . esomeprazole (NEXIUM) 40 MG capsule Take 40 mg by mouth daily.    . folic acid (FOLVITE) 400 MCG tablet Take 400 mcg by mouth daily.   . furosemide (LASIX) 40 MG tablet Take 40 mg by mouth See admin instructions. Take 1 tablet (40 mg) by mouth (scheduled) in the morning, may take an additional dose if needed for swelling.   . irbesartan (AVAPRO) 150 MG tablet TAKE (1) TABLET BY MOUTH ONCE DAILY. (Patient taking differently: Take 150 mg by mouth daily at 2 PM. )   . metoprolol succinate (TOPROL-XL) 25 MG 24 hr tablet TAKE 2 TABLETS BY MOUTH 2 TIMES DAILY. (Patient taking differently: Take 50 mg by mouth in the morning and at bedtime. )   . Multiple Vitamin (MULTIVITAMIN WITH MINERALS) TABS tablet Take 1 tablet by mouth daily.   . nitroGLYCERIN (NITROSTAT) 0.4 MG SL tablet Place 0.4 mg under the tongue every 5 (five) minutes x 3 doses as needed for chest pain.  09/25/2014: .  . ondansetron (ZOFRAN) 4 MG tablet Take 4 mg by mouth every 6 (six) hours as needed for nausea or vomiting.    . potassium chloride SA (K-DUR,KLOR-CON) 20 MEQ tablet Take 20 mEq by mouth See admin instructions. Takes 1 tablet (20 meq) by mouth daily (scheduled) & a 2nd tablet only if has to take  a 2nd dose of Lasix   . primidone (MYSOLINE) 50 MG tablet Take 50 mg by mouth at bedtime.    Marland Kitchen PROAIR HFA 108 (90 Base) MCG/ACT inhaler Inhale 1 puff into the lungs every 4 (four) hours as needed for wheezing or shortness of breath.    . rosuvastatin (CRESTOR) 20 MG tablet Take 20 mg by mouth at bedtime.    No facility-administered encounter medications on file as of 05/07/2020.    Functional Status:  In your present state of health, do you have any difficulty performing the following activities: 05/01/2020  Hearing? N  Vision? N  Difficulty concentrating or making decisions? N  Walking or climbing stairs? Y  Dressing or  bathing? N  Doing errands, shopping? N  Some recent data might be hidden    Fall/Depression Screening: Fall Risk  12/25/2015  Falls in the past year? No   PHQ 2/9 Scores 05/07/2020 04/05/2020 12/25/2015  PHQ - 2 Score 0 1 0  Exception Documentation - (No Data) -    Assessment:  Goals Addressed            This Visit's Progress   . Make and Keep All Appointments       Follow Up Date 08/02/20    - ask family or friend for a ride - call to cancel if needed - keep a calendar with appointment dates    Why is this important?   Part of staying healthy is seeing the doctor for follow-up care.  If you forget your appointments, there are some things you can do to stay on track.    Notes:     Marland Kitchen Manage My Medicine       Follow Up Date 07/03/20    - call for medicine refill 2 or 3 days before it runs out - call if I am sick and can't take my medicine - keep a list of all the medicines I take; vitamins and herbals too - learn to read medicine labels    Why is this important?   These steps will help you keep on track with your medicines.    Notes:     . Track and Manage My Blood Pressure       Follow Up Date 08/02/20    - check blood pressure daily - write blood pressure results in a log or diary    Why is this important?   You won't feel high blood pressure, but it can still hurt your blood vessels.  High blood pressure can cause heart or kidney problems. It can also cause a stroke.  Making lifestyle changes like losing a little weight or eating less salt will help.  Checking your blood pressure at home and at different times of the day can help to control blood pressure.  If the doctor prescribes medicine remember to take it the way the doctor ordered.  Call the office if you cannot afford the medicine or if there are questions about it.     Notes: Nurse will send patient a B/P cuff, discussed the importance of monitoring B/P daily and recording.     . Track and Manage  Symptoms       Follow Up Date 08/02/20   - eat more whole grains, fruits and vegetables, lean meats and healthy fats - follow rescue plan if symptoms flare-up - know when to call the doctor - track symptoms and what helps feel better or worse    Why is this important?  You will be able to handle your symptoms better if you keep track of them.  Making some simple changes to your lifestyle will help.  Eating healthy is one thing you can do to take good care of yourself.    Notes: Nurse will send a scale for the patient to start to weigh daily.      Plan: RN Health Coach will send PCP a barrier letter and today's assessment note, will send patient hypertension education, CHF zones and action plans, Afib zones and actions plans, Advance Directive documents, Ensure coupons, B/P cuff and scale, will call patient within the month of November, and patient agrees to future outreach calls.   Blanchie Serve RN, BSN [facility] Care Management  RN Health Coach [phone] [email or web address]

## 2020-05-08 ENCOUNTER — Encounter (HOSPITAL_COMMUNITY): Payer: Self-pay | Admitting: Internal Medicine

## 2020-05-09 ENCOUNTER — Telehealth: Payer: Self-pay | Admitting: Cardiology

## 2020-05-09 ENCOUNTER — Other Ambulatory Visit: Payer: Self-pay | Admitting: *Deleted

## 2020-05-09 ENCOUNTER — Telehealth (INDEPENDENT_AMBULATORY_CARE_PROVIDER_SITE_OTHER): Payer: Self-pay | Admitting: Internal Medicine

## 2020-05-09 NOTE — Telephone Encounter (Signed)
Stop digoxin and start amiodarone 200 mg twice daily for a month then 200 mg daily. GT

## 2020-05-09 NOTE — Telephone Encounter (Signed)
Patient called to give progress report - states she is doing fine since her procedure -

## 2020-05-09 NOTE — Telephone Encounter (Signed)
New message     Sharee Pimple would like you to call her back,  She would like to speak with Dr Domenic Polite patient states she was switched from amiodarone to  digoxin (LANOXIN) 0.125 MG tablet [[id number]] patient doesn't feel that digoxin is controlling her afib, so she would like to go back to amiodarone

## 2020-05-09 NOTE — Patient Outreach (Signed)
[facility]) Care Management  05/09/2020  [name] 07-09-1936 [id number]  Successful telephone outreach call to patient. HIPAA identifiers obtained. Nurse called patient back in response to a VM she left nurse. Patient wanted nurse to know that she did receive the scale today but has not receive the B/P cuff and other materials as of yet. Patient also shared that she did not feel that the Digoxin medication was working very well. She explained that she feels more SOB than usual and she can feel her heart race at times.  Adding that she felt good when she was taking the amiodarone medication. Nurse shared that Dr. Tanna Furry note stated that she was taking off of that medication because the patient stated that it made her feel nervous and digoxin was prescribed as a replacement. The patient stated that she does not hear well and feels that it was a misunderstanding. Per patient's memory she was trying to explain that it made her feel nervous about not taking amiodarone. Nurse encouraged the patient to call the cardiologies office to explain how she is feeling but the patient was reluctant saying she did not want to bother the doctor. Nurse explained that she was concerned about how the patient was feeling and stated to the patient that she will call Dr. Lovena Le to notify him about how the situation.   Nurse called Dr. Tanna Furry office and was told that he will not be back in the office for several days. This nurse then asked if Dr. Myles Gip nurse could be notified of the above issue and call this nurse back. Nurse was told that the message would be sent to Dr. Domenic Polite.   Plan: Nurse will wait for a callback from cardiology.   Emelia Loron RN, [place] [phone] [name]

## 2020-05-09 NOTE — Telephone Encounter (Signed)
Pt would like switch back to Amiodarone. She states there was a mix up and she was taken off amiodarone. Please advise.

## 2020-05-10 ENCOUNTER — Other Ambulatory Visit: Payer: Self-pay | Admitting: *Deleted

## 2020-05-10 MED ORDER — AMIODARONE HCL 200 MG PO TABS: ORAL_TABLET | ORAL | 3 refills | Status: DC

## 2020-05-10 NOTE — Telephone Encounter (Signed)
Pt notified and voiced understanding 

## 2020-05-10 NOTE — Addendum Note (Signed)
Addended by: Levonne Hubert on: 05/10/2020 08:43 AM   Modules accepted: Orders

## 2020-05-10 NOTE — Patient Outreach (Addendum)
[facility]) Care Management  05/10/2020  ALEXY BRINGLE September 08, 1935 [id number]  Successful telephone outreach call to patient. HIPAA identifiers obtained. Nurse called patient to confirm that her medication was change back to amiodarone and to ensure that she did not have any further questions or concerns. Patient confirmed that Dr. Tanna Furry office contacted her this morning and that she was going to pick up the medication in a little while. Patient thanked this nurse for her assistance.   Plan: RN Health Coach will call patient within the month of November and patient agrees to future outreach calls.   Emelia Loron RN, BSN [place] [phone] [email or web address]

## 2020-05-16 NOTE — Telephone Encounter (Signed)
Glad to hear that patient can swallow better.

## 2020-05-17 DIAGNOSIS — R1312 Dysphagia, oropharyngeal phase: Secondary | ICD-10-CM | POA: Diagnosis not present

## 2020-05-17 DIAGNOSIS — J9611 Chronic respiratory failure with hypoxia: Secondary | ICD-10-CM | POA: Diagnosis not present

## 2020-05-17 DIAGNOSIS — R7301 Impaired fasting glucose: Secondary | ICD-10-CM | POA: Diagnosis not present

## 2020-05-17 DIAGNOSIS — R0789 Other chest pain: Secondary | ICD-10-CM | POA: Diagnosis not present

## 2020-05-17 DIAGNOSIS — E7849 Other hyperlipidemia: Secondary | ICD-10-CM | POA: Diagnosis not present

## 2020-05-21 DIAGNOSIS — I480 Paroxysmal atrial fibrillation: Secondary | ICD-10-CM | POA: Diagnosis not present

## 2020-05-21 DIAGNOSIS — R0789 Other chest pain: Secondary | ICD-10-CM | POA: Diagnosis not present

## 2020-05-21 DIAGNOSIS — R0602 Shortness of breath: Secondary | ICD-10-CM | POA: Diagnosis not present

## 2020-05-21 DIAGNOSIS — M79604 Pain in right leg: Secondary | ICD-10-CM | POA: Diagnosis not present

## 2020-05-29 DIAGNOSIS — R0602 Shortness of breath: Secondary | ICD-10-CM | POA: Diagnosis not present

## 2020-05-29 DIAGNOSIS — I2510 Atherosclerotic heart disease of native coronary artery without angina pectoris: Secondary | ICD-10-CM | POA: Diagnosis not present

## 2020-05-29 NOTE — Progress Notes (Signed)
Cardiology Office Note  Date: 05/30/2020   ID: [name], DOB [birth year], MRN [id number]  PCP:  Celene Squibb, MD  Cardiologist:  Rozann Lesches, MD Electrophysiologist:  [name], MD   Chief Complaint  Patient presents with  . Cardiac follow-up    History of Present Illness: [name] is an 84 y.o. female last seen in May.  She is here today for a follow-up visit.  I reviewed the chart since our last encounter. She continues to follow with Dr. Lovena Le, Medtronic pacemaker in place. She had a recent visit in September.  She was started on Lanoxin at that time in addition to her beta-blocker for better control of palpitations.  It was mentioned that she did not tolerate amiodarone due to nervousness, apparently was off of the medication at that time, but it is not clear when it was actually discontinued and the patient was not certain about that either on discussion today.  I see that she was ultimately started back on amiodarone at loading dose by Dr. Lovena Le, apparently she was experiencing recurring atrial fibrillation documented around the time that she underwent endoscopy with Dr. Laural Golden. Last device interrogation indicated normal function.    She has been taken off Lanoxin but continues on Toprol-XL.  As mentioned previously she is not anticoagulated based on previous discussions and patient preference, also prior history of hemorrhagic pericardial effusion.  Typically she has had a fairly low atrial fibrillation burden.  She states that she feels poorly, nervous, short of breath, lack of energy.  She states that she is taking her medications as directed.  I did review her recent lab work as noted below.   Past Medical History:  Diagnosis Date  . Anemia   . Anxiety   . Atrial fibrillation   . Chronic back pain   . COPD (chronic obstructive pulmonary disease) ([place])   . Coronary atherosclerosis of native coronary artery    DES x 2 to RCA 10/10  . 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 Essential hypertension   . GERD (gastroesophageal reflux disease)   . H/O hiatal hernia   . Headache(784.0)   . HOH (hard of hearing)   . Hyperlipidemia   . Neuromuscular disorder ([place])    Tremors  . Pericardial effusion    Hemorrhagic   . Presence of permanent cardiac pacemaker   . Tachycardia-bradycardia syndrome ([place])    s/p Medtronic Adapta L dual chamber device  5/10    Past Surgical History:  Procedure Laterality Date  . APPENDECTOMY    . CARDIAC CATHETERIZATION  2010   stents x2.  Marland Kitchen CATARACT EXTRACTION    . CHOLECYSTECTOMY    . COLONOSCOPY  2011  . COLONOSCOPY N/A 10/19/2014   Procedure: COLONOSCOPY;  Surgeon: Rogene Houston, MD;  [facility];  Service: Endoscopy;  Laterality: N/A;  1030  . ESOPHAGEAL DILATION N/A 05/02/2020   Procedure: ESOPHAGEAL DILATION;  Surgeon: Rogene Houston, MD;  Location: [facility] END[facility] SU[facility];  Service: Gastroenterology;  Laterality: N/A;  . ESOPHAGOGASTRODUODENOSCOPY N/A 10/26/2014   Procedure: ESOPHAGOGASTRODUODENOSCOPY (EGD);  Surgeon: Rogene Houston, MD;  Location: [facility];  Service: Endoscopy;  Laterality: N/A;  230 - Dr. has lunch and learn  . ESOPHAGOGASTRODUODENOSCOPY (EGD) WITH PROPOFOL N/A 05/02/2020   Procedure: ESOPHAGOGASTRODUODENOSCOPY (EGD) WITH PROPOFOL;  Surgeon: Rogene Houston, MD;  [facility];  Service: Gastroenterology;  Laterality: N/A;  250  . Esophagogastroduodenoscopy  with esophageal dilation  2004, 2006, 2007  . GIVENS CAPSULE STUDY N/A 10/31/2014   Procedure: GIVENS CAPSULE STUDY;  Surgeon: Rogene Houston, MD;  Location: [facility];  Service: Endoscopy;  Laterality: N/A;  730 -- pacemaker--needs monitoring--outpatient bed  . INSERT / REPLACE / REMOVE PACEMAKER  2010  . LEFT HEART CATHETERIZATION WITH CORONARY ANGIOGRAM N/A 11/17/2011   Procedure: LEFT HEART CATHETERIZATION WITH CORONARY ANGIOGRAM;  Surgeon: Sherren Mocha, MD;  Location: [facility] C[facility] LAB;   Service: Cardiovascular;  Laterality: N/A;  . LEFT HEART CATHETERIZATION WITH CORONARY ANGIOGRAM N/A 09/26/2014   Procedure: LEFT HEART CATHETERIZATION WITH CORONARY ANGIOGRAM;  Surgeon: Leonie Man, MD;  Location: [facility] CATH LAB;  Service: Cardiovascular;  Laterality: N/A;  . Right rotator cuff repair    . SHOULDER ACROMIOPLASTY Right 05/30/2015   Procedure: RIGHT SHOULDER ACROMIOPLASTY;  Surgeon: Carole Civil, MD;  Location: [facility] ORS;  Service: Orthopedics;  Laterality: Right;  . SHOULDER OPEN ROTATOR CUFF REPAIR Right 05/30/2015   Procedure: OPEN ROTATOR CUFF REPAIR RIGHT SHOULDER;  Surgeon: Carole Civil, MD;  Location: [facility] ORS;  Service: Orthopedics;  Laterality: Right;  . SHOULDER OPEN ROTATOR CUFF REPAIR Left 10/22/2016   Procedure: ROTATOR CUFF REPAIR SHOULDER OPEN;  Surgeon: Carole Civil, MD;  Location: [facility] ORS;  Service: Orthopedics;  Laterality: Left;  . Subxiphoid pericardial window  11/10  . VAGINAL HYSTERECTOMY      Current Outpatient Medications  Medication Sig Dispense Refill  . ALPRAZolam (XANAX) 1 MG tablet Take 1 mg by mouth 2 (two) times daily as needed for anxiety or sleep.     Marland Kitchen amiodarone (PACERONE) 200 MG tablet Take 200 mg Two Times Daily for 1 month then start 200 mg Daily 120 tablet 3  . clopidogrel (PLAVIX) 75 MG tablet Take 1 tablet (75 mg total) by mouth daily.    . Cyanocobalamin (VITAMIN B-12) 5000 MCG SUBL Take 5,000 mcg by mouth daily.    . diclofenac Sodium (VOLTAREN) 1 % GEL Apply 2-4 g topically every 6 (six) hours as needed (leg/knee pain.).     Marland Kitchen esomeprazole (NEXIUM) 40 MG capsule Take 40 mg by mouth daily.     . folic acid (FOLVITE) 419 MCG tablet Take 400 mcg by mouth daily.    . furosemide (LASIX) 40 MG tablet Take 40 mg by mouth See admin instructions. Take 1 tablet (40 mg) by mouth (scheduled) in the morning, may take an additional dose if needed for swelling.    . irbesartan (AVAPRO) 150 MG tablet TAKE (1) TABLET BY MOUTH ONCE DAILY.  (Patient taking differently: Take 150 mg by mouth daily at 2 PM. ) 90 tablet 3  . metoprolol succinate (TOPROL-XL) 25 MG 24 hr tablet Take 3 tablets (75 mg total) by mouth in the morning. & 50 mg in the evening 450 tablet 3  . Multiple Vitamin (MULTIVITAMIN WITH MINERALS) TABS tablet Take 1 tablet by mouth daily.    . nitroGLYCERIN (NITROSTAT) 0.4 MG SL tablet Place 0.4 mg under the tongue every 5 (five) minutes x 3 doses as needed for chest pain.     Marland Kitchen ondansetron (ZOFRAN) 4 MG tablet Take 4 mg by mouth every 6 (six) hours as needed for nausea or vomiting.     . potassium chloride SA (K-DUR,KLOR-CON) 20 MEQ tablet Take 20 mEq by mouth See admin instructions. Takes 1 tablet (20 meq) by mouth daily (scheduled) & a 2nd tablet only if has to take a 2nd dose of Lasix    .  primidone (MYSOLINE) 50 MG tablet Take 50 mg by mouth at bedtime.     Marland Kitchen PROAIR HFA 108 (90 Base) MCG/ACT inhaler Inhale 1 puff into the lungs every 4 (four) hours as needed for wheezing or shortness of breath.     . rosuvastatin (CRESTOR) 20 MG tablet Take 20 mg by mouth at bedtime.     No current facility-administered medications for this visit.   Allergies:  Amitriptyline hcl and Sulfonamide derivatives   Social History: The patient  reports that she quit smoking about 31 years ago. Her smoking use included cigarettes. She has a 20.00 pack-year smoking history. She has never used smokeless tobacco. She reports that she does not drink alcohol and does not use drugs.   Family History: The patient's family history includes Arthritis in an other family member; Asthma in an other family member; Cancer in her mother; Coronary artery disease in her brother and sister; Lung disease in an other family member.   ROS: No syncope.  Physical Exam: VS:  BP (!) 142/82   Pulse 98   Ht 5' 5.5" (1.664 m)   Wt 147 lb (66.7 kg)   SpO2 94%   BMI 24.09 kg/m , BMI Body mass index is 24.09 kg/m.  Wt Readings from Last 3 Encounters:  05/30/20  147 lb (66.7 kg)  05/02/20 147 lb 14.9 oz (67.1 kg)  04/24/20 148 lb (67.1 kg)    General: Elderly woman in no distress. HEENT: Conjunctiva and lids normal, wearing a mask. Neck: Supple, no elevated JVP or carotid bruits, no thyromegaly. Lungs: Decreased breath sounds, nonlabored breathing at rest. Cardiac: Irregularly irregular, no S3, soft systolic murmur, no pericardial rub. Abdomen: Soft, nontender, bowel sounds present. Extremities: No pitting edema, distal pulses 2+. Skin: Warm and dry. Musculoskeletal: Kyphosis present. Neuropsychiatric: Alert and oriented x3, affect grossly appropriate.  Tremor.  ECG:  An ECG dated 05/01/2020 was personally reviewed today and demonstrated:  Probable atrial paced rhythm with tremor artifact.  Recent Labwork: 05/01/2020: BUN 8; Creatinine, Ser 0.52; Hemoglobin 13.1; Platelets 211; Potassium 4.0; Sodium 140   Other Studies Reviewed Today:  Echocardiogram 06/01/2017: Study Conclusions  - Left ventricle: The cavity size was normal. Wall thickness was normal. Systolic function was vigorous. The estimated ejection fraction was in the range of 65% to 70%. The study is not technically sufficient to allow evaluation of LV diastolic function. - Aortic valve: There was mild regurgitation. - Mitral valve: Calcified annulus. Mildly thickened leaflets . There was mild regurgitation. - Left atrium: The atrium was moderately dilated. - Right atrium: The atrium was mildly dilated.  Assessment and Plan:  1.  Paroxysmal to persistent atrial fibrillation.  She was recently started back on amiodarone at loading dose by Dr. Lovena Le per review of the chart based on recurring palpitations present symptoms.  Her last ECG actually shows what looks like a probable atrial paced rhythm with tremor artifact rather than atrial fibrillation.  Heart rate is irregular today however.  CHA2DS2-VASc score is 5, but she is not anticoagulated based on previous  discussion and patient preference, also history of hemorrhagic pericardial effusion.  Management options are somewhat limited at this time, we cannot do a cardioversion unless she is anticoagulated.  Question whether she would be a candidate for AV node ablation since she already has a pacemaker in place.  She will continue current amiodarone dosing, increase Toprol-XL to 75 mg in the morning and 50 mg in the afternoon.  We will arrange a follow-up with  Dr. Lovena Le (and if not available the atrial fibrillation clinic) for further discussion of management strategies.  2.  CAD status post DES to the RCA in 2010.  She does not report any obvious angina at this time.  Continue Plavix, Toprol-XL, Avapro, and Crestor.  3.  Essential hypertension, blood pressure elevated when she first came in, rechecked at 366 systolic.  Continue current doses of Avapro, Toprol-XL being further increased to assist with heart rate control as well.  4.  Mixed hyperlipidemia, following with Dr. Nevada Crane.  She is on Crestor currently 20 mg daily.  Last LDL was in the 180s.  Need to continue with up titration of therapy, possibly addition of Zetia.  If this is not effective could be considered for PCSK9 inhibitor.  Medication Adjustments/Labs and Tests Ordered: Current medicines are reviewed at length with the patient today.  Concerns regarding medicines are outlined above.   Tests Ordered: No orders of the defined types were placed in this encounter.   Medication Changes: Meds ordered this encounter  Medications  . metoprolol succinate (TOPROL-XL) 25 MG 24 hr tablet    Sig: Take 3 tablets (75 mg total) by mouth in the morning. & 50 mg in the evening    Dispense:  450 tablet    Refill:  3    05/30/2020 dose increase    Disposition:  Follow up 3 months in the [place] office.  Signed, Satira Sark, MD, [facility] 05/30/2020 8:51 AM    [facility] [place], [place], [place]  [address] Phone: [phone]; Fax: [phone]

## 2020-05-30 ENCOUNTER — Ambulatory Visit: Payer: Medicare Other | Admitting: Cardiology

## 2020-05-30 ENCOUNTER — Encounter: Payer: Self-pay | Admitting: Cardiology

## 2020-05-30 VITALS — BP 142/82 | HR 98 | Ht 65.5 in | Wt 147.0 lb

## 2020-05-30 DIAGNOSIS — E782 Mixed hyperlipidemia: Secondary | ICD-10-CM | POA: Diagnosis not present

## 2020-05-30 DIAGNOSIS — I10 Essential (primary) hypertension: Secondary | ICD-10-CM

## 2020-05-30 DIAGNOSIS — I480 Paroxysmal atrial fibrillation: Secondary | ICD-10-CM

## 2020-05-30 DIAGNOSIS — I25119 Atherosclerotic heart disease of native coronary artery with unspecified angina pectoris: Secondary | ICD-10-CM | POA: Diagnosis not present

## 2020-05-30 MED ORDER — METOPROLOL SUCCINATE ER 25 MG PO TB24
75.0000 mg | ORAL_TABLET | Freq: Every morning | ORAL | 3 refills | Status: DC
Start: 2020-05-30 — End: 2021-07-02

## 2020-05-30 NOTE — Patient Instructions (Addendum)
Medication Instructions:    Your physician has recommended you make the following change in your medication:   Increase metoprolol succinate to 75 mg in the morning (3 tablets) and 50 mg in the evening (2 tablets)  Continue other medications the same  Labwork:  None  Testing/Procedures:  None  Follow-Up:  Your physician recommends that you schedule a follow-up appointment in: 3 months.  Your physician recommends that you schedule a follow-up appointment in: with Dr. Lovena Le.  Any Other Special Instructions Will Be Listed Below (If Applicable).  If you need a refill on your cardiac medications before your next appointment, please call your pharmacy.

## 2020-06-11 ENCOUNTER — Other Ambulatory Visit: Payer: Self-pay | Admitting: *Deleted

## 2020-06-11 DIAGNOSIS — R0602 Shortness of breath: Secondary | ICD-10-CM | POA: Diagnosis not present

## 2020-06-11 DIAGNOSIS — R110 Nausea: Secondary | ICD-10-CM | POA: Diagnosis not present

## 2020-06-11 DIAGNOSIS — I480 Paroxysmal atrial fibrillation: Secondary | ICD-10-CM | POA: Diagnosis not present

## 2020-06-11 DIAGNOSIS — I87322 Chronic venous hypertension (idiopathic) with inflammation of left lower extremity: Secondary | ICD-10-CM | POA: Diagnosis not present

## 2020-06-11 NOTE — Patient Outreach (Signed)
[facility]) Care Management  [facility] Care Manager  06/11/2020   [name] Dec 23, 1935 [id number]  Subjective: Successful telephone outreach call to patient. HIPAA identifiers obtained. Patient states that she has had a rash on both of her legs for over a week  that is getting worse. She reports that the rash is oozing and has been bloody. Patient also reports that she feels very nervous and the medication that is prescribed is not effective. Nurse explained to the patient that she should be seen by her doctor if the rash is getting worse and oozing. Nurse called patient's PCP office to notify and made an appointment for the patient to be seen today at 1440. Nurse also encouraged the patient to get her annual flu shot today when she goes to the appointment.   Patient states that she did receive that scale, B/P cuff, hypertension and CHF education, and the Advance Directive documents this nurse sent her in the mail. She reports that she is taking her weight and B/P daily but is unable to write down her values due to her chronic tremors. Nurse encourage the patient to look at the B/P cuff monitor memory and had her take out the [facility] CHF zones and action plan resource sheet to follow. Patient reports that her last B/P was 147/87 and her weight per her memory has stayed within 2-3 pound range. Patient did state she would discuss the advance directive documents with her niece [name]. She denies having any recent fall and explains that she is able to prepare her meals and perform all of her ADLs. Her niece [name] does her grocery shopping for her, her friends and church family also support her by assisting her with rides to her provider appointments and transportation to run needed errands. The patient was appreciative for the call and states she will call this nurse for further assistance if needed.   Encounter Medications:  Outpatient Encounter Medications as of 06/11/2020  Medication Sig Note   . ALPRAZolam (XANAX) 1 MG tablet Take 1 mg by mouth 2 (two) times daily as needed for anxiety or sleep.  05/02/2020: Took 1/2 tablet  . amiodarone (PACERONE) 200 MG tablet Take 200 mg Two Times Daily for 1 month then start 200 mg Daily   . clopidogrel (PLAVIX) 75 MG tablet Take 1 tablet (75 mg total) by mouth daily.   . Cyanocobalamin (VITAMIN B-12) 5000 MCG SUBL Take 5,000 mcg by mouth daily.   . diclofenac Sodium (VOLTAREN) 1 % GEL Apply 2-4 g topically every 6 (six) hours as needed (leg/knee pain.).    Marland Kitchen esomeprazole (NEXIUM) 40 MG capsule Take 40 mg by mouth daily.    . folic acid (FOLVITE) 400 MCG tablet Take 400 mcg by mouth daily.   . furosemide (LASIX) 40 MG tablet Take 40 mg by mouth See admin instructions. Take 1 tablet (40 mg) by mouth (scheduled) in the morning, may take an additional dose if needed for swelling.   . irbesartan (AVAPRO) 150 MG tablet TAKE (1) TABLET BY MOUTH ONCE DAILY. (Patient taking differently: Take 150 mg by mouth daily at 2 PM. )   . LORazepam (ATIVAN) 0.5 MG tablet Take 0.5 mg by mouth 2 (two) times daily.   . metoprolol succinate (TOPROL-XL) 25 MG 24 hr tablet Take 3 tablets (75 mg total) by mouth in the morning. & 50 mg in the evening   . Multiple Vitamin (MULTIVITAMIN WITH MINERALS) TABS tablet Take 1 tablet by mouth daily.   Marland Kitchen  nitroGLYCERIN (NITROSTAT) 0.4 MG SL tablet Place 0.4 mg under the tongue every 5 (five) minutes x 3 doses as needed for chest pain.  09/25/2014: .  . ondansetron (ZOFRAN) 4 MG tablet Take 4 mg by mouth every 6 (six) hours as needed for nausea or vomiting.    . potassium chloride SA (K-DUR,KLOR-CON) 20 MEQ tablet Take 20 mEq by mouth See admin instructions. Takes 1 tablet (20 meq) by mouth daily (scheduled) & a 2nd tablet only if has to take a 2nd dose of Lasix   . primidone (MYSOLINE) 50 MG tablet Take 50 mg by mouth at bedtime.    Marland Kitchen PROAIR HFA 108 (90 Base) MCG/ACT inhaler Inhale 1 puff into the lungs every 4 (four) hours as needed for  wheezing or shortness of breath.    . rosuvastatin (CRESTOR) 20 MG tablet Take 20 mg by mouth at bedtime.    No facility-administered encounter medications on file as of 06/11/2020.    Functional Status:  In your present state of health, do you have any difficulty performing the following activities: 05/01/2020  Hearing? N  Vision? N  Difficulty concentrating or making decisions? N  Walking or climbing stairs? Y  Dressing or bathing? N  Doing errands, shopping? N  Some recent data might be hidden    Fall/Depression Screening: Fall Risk  06/11/2020 06/11/2020 05/07/2020  Falls in the past year? 1 1 1   Number falls in past yr: 0 0 0  Injury with Fall? 0 0 0  Risk for fall due to : History of fall(s);Impaired balance/gait;Impaired mobility History of fall(s);Impaired mobility;Impaired balance/gait Impaired mobility  Follow up Falls prevention discussed;Education provided;Falls evaluation completed Falls prevention discussed;Education provided;Falls evaluation completed Falls prevention discussed;Education provided;Falls evaluation completed   PHQ 2/9 Scores 05/07/2020 04/05/2020 12/25/2015  PHQ - 2 Score 0 1 0  Exception Documentation - (No Data) -    Assessment:  Goals Addressed            This Visit's Progress   . COMPLETED: Make and Keep All Appointments       Follow Up Date 08/02/20    - ask family or friend for a ride - call to cancel if needed - keep a calendar with appointment dates    Why is this important?   Part of staying healthy is seeing the doctor for follow-up care.  If you forget your appointments, there are some things you can do to stay on track.    Notes: Patient keeps track of her appointments and states she does have transportation to her provider appointments.  Updated: 06/11/20    . COMPLETED: Manage My Medicine   On track    Follow Up Date 08/02/20   - call for medicine refill 2 or 3 days before it runs out - call if I am sick and can't take my  medicine - keep a list of all the medicines I take; vitamins and herbals too - learn to read medicine labels    Why is this important?   These steps will help you keep on track with your medicines.    Notes: Per patient she does not have difficulty managing her medications. She can afford them and does not have any questions about them  Updated: 06/11/20    . Track and Manage My Blood Pressure       Follow Up Date 08/02/20    - check blood pressure daily - write blood pressure results in a log or diary  Why is this important?   You won't feel high blood pressure, but it can still hurt your blood vessels.  High blood pressure can cause heart or kidney problems. It can also cause a stroke.  Making lifestyle changes like losing a little weight or eating less salt will help.  Checking your blood pressure at home and at different times of the day can help to control blood pressure.  If the doctor prescribes medicine remember to take it the way the doctor ordered.  Call the office if you cannot afford the medicine or if there are questions about it.     Notes: Patient received the scale, B/P cuff, Hypertension and CHF education. Patient states she is taking her weight and B/P daily but is unable to write down her daily values due to severe tremors. Nurse reviewed signs and symptoms action plans and zones. Updated: 06/11/20    . Track and Manage Symptoms       Follow Up Date 08/02/20   - eat more whole grains, fruits and vegetables, lean meats and healthy fats - follow rescue plan if symptoms flare-up - know when to call the doctor - track symptoms and what helps feel better or worse    Why is this important?   You will be able to handle your symptoms better if you keep track of them.  Making some simple changes to your lifestyle will help.  Eating healthy is one thing you can do to take good care of yourself.    Notes: Patient received the scale, B/P cuff, Hypertension and CHF  education. Patient states she is taking her weight and B/P daily but is unable to write down her daily values due to severe tremors. Nurse reviewed signs and symptoms action plans and zones. Updated: 06/11/20      Plan: RN Health Coach will call patient within the month of December, will send patient Ensure coupons, and patient agrees to future outreach calls.   Blanchie Serve RN, BSN [facility] Care Management  RN Health Coach [phone] [email or web address]

## 2020-06-12 ENCOUNTER — Other Ambulatory Visit: Payer: Self-pay | Admitting: *Deleted

## 2020-06-12 NOTE — Patient Outreach (Signed)
[facility]) Care Management  06/12/2020  ANITTA TENNY 1936/06/10 [id number]  Nurse called patient today to see how she was feeling after her PCP appointment. Patient explained that Dr. Nevada Crane did adjust her nerve medicine and examined the rash on her legs which was better by the time she arrived yesterday. Patient shared that she is having difficulty preparing her meals due to her chronic tremors. Per patient she is drinking a lot of Ensure to supplement. Patient now is admitting that she does receive help from her family and friends but she is unable to fully take care of herself at home. Per patient, she thinks she does receive too much money to qualify for Medicare and adds in the near future she may need to investigate where she could be placed in a facility but explained that she wants to wait until after Christmas if possible. At this point she feels it would be extremely helpful if she could receive any type of meal delivery. Nurse encouraged patient to discuss what is happening with her family and friends. Patient stated that she does not want to be a burden and that her family are busy with their lives. Nurse again encouraged patient to discuss her situation with her family explaining that they would want to know how she is doing. Nurse also stated she would send in a referral to [facility] to see if Moms meals could be arranged to be delivered and also have them discuss facility options with her as placement would take a while. Patient stated that starting the process would be beneficial.   Plan: RN Health Coach will send [facility] referral, will call patient within the month of December, and patient agrees to future outreach calls.   Emelia Loron RN, BSN [place] [phone] [email or web address]

## 2020-06-18 ENCOUNTER — Other Ambulatory Visit: Payer: Self-pay | Admitting: *Deleted

## 2020-06-18 NOTE — Patient Outreach (Signed)
[facility]) Care Management  06/18/2020  [name] July 23, 1936 [id number]  CSW contacted pt and confirmed her identity.  CSW introduced self, role and reason for call; food support/needs. Pt reports living alone; has "tremors"which makes preparing meals difficult. Pt has family support; a niece who provides rides to appointments, etc and another niece who helps with groceries.   CSW discussed with pt the [employer] meals program and she is interested in a free trial.  CSW will make referral to begin later this week with a prepared meal delivery to her home.  CSW also discussed Meals on Wheels program and the waiting list to which she is agreeable.  CSW received pt's permission to refer via [employer] for meals on wheels program as well as other non-profit and government-funded programs to aide in food insecurity.   CSW also suggested to pt that she may be eligible for Medicaid based on her income; close to the allowed amount. CSW will research this further.  Pt gave CSW permission to call her niece Inez Catalina to update on the above plans- will call later today per pt request as Inez Catalina working 3rd shift this week.   CSW will plan to touch base with pt next week for updates on her meal delivery and to further discuss Medicaid.    Eduard Clos, MSW, [place] Worker  [place] [phone]

## 2020-06-25 ENCOUNTER — Other Ambulatory Visit: Payer: Self-pay | Admitting: *Deleted

## 2020-06-26 ENCOUNTER — Other Ambulatory Visit: Payer: Self-pay | Admitting: *Deleted

## 2020-06-26 NOTE — Patient Outreach (Signed)
[facility]) Care Management  06/26/2020  [name] 09-Sep-1935 [id number]   CSW spoke with pt who reports receiving the [employer] meal delivery. Pt reports she has enjoyed some of the meals and is interested in getting this delivery monthly until she is able to get meals on wheels.  Pt has been placed on wait list for meals on wheels and is advised the wait list is long/several months at least.   CSW will sign off at this time and advise PCP and [facility] team of above plans.   Eduard Clos, MSW, [place] Worker  [place] [phone]

## 2020-06-27 DIAGNOSIS — E7849 Other hyperlipidemia: Secondary | ICD-10-CM | POA: Diagnosis not present

## 2020-06-27 DIAGNOSIS — K219 Gastro-esophageal reflux disease without esophagitis: Secondary | ICD-10-CM | POA: Diagnosis not present

## 2020-06-27 DIAGNOSIS — E539 Vitamin B deficiency, unspecified: Secondary | ICD-10-CM | POA: Diagnosis not present

## 2020-06-27 DIAGNOSIS — I10 Essential (primary) hypertension: Secondary | ICD-10-CM | POA: Diagnosis not present

## 2020-06-29 DIAGNOSIS — R0602 Shortness of breath: Secondary | ICD-10-CM | POA: Diagnosis not present

## 2020-06-29 DIAGNOSIS — I2510 Atherosclerotic heart disease of native coronary artery without angina pectoris: Secondary | ICD-10-CM | POA: Diagnosis not present

## 2020-07-02 ENCOUNTER — Ambulatory Visit: Payer: Medicare Other | Admitting: Cardiology

## 2020-07-03 ENCOUNTER — Ambulatory Visit (INDEPENDENT_AMBULATORY_CARE_PROVIDER_SITE_OTHER): Payer: Medicare Other | Admitting: Internal Medicine

## 2020-07-03 ENCOUNTER — Encounter (INDEPENDENT_AMBULATORY_CARE_PROVIDER_SITE_OTHER): Payer: Self-pay | Admitting: Internal Medicine

## 2020-07-03 ENCOUNTER — Other Ambulatory Visit: Payer: Self-pay

## 2020-07-03 VITALS — BP 204/113 | HR 125 | Temp 97.7°F | Ht 65.0 in | Wt 144.2 lb

## 2020-07-03 DIAGNOSIS — R1319 Other dysphagia: Secondary | ICD-10-CM

## 2020-07-03 DIAGNOSIS — R1310 Dysphagia, unspecified: Secondary | ICD-10-CM | POA: Insufficient documentation

## 2020-07-03 DIAGNOSIS — K219 Gastro-esophageal reflux disease without esophagitis: Secondary | ICD-10-CM | POA: Diagnosis not present

## 2020-07-03 DIAGNOSIS — L308 Other specified dermatitis: Secondary | ICD-10-CM | POA: Diagnosis not present

## 2020-07-03 MED ORDER — OMEPRAZOLE 40 MG PO CPDR: 40.0000 mg | DELAYED_RELEASE_CAPSULE | Freq: Every day | ORAL | 5 refills | Status: DC

## 2020-07-03 NOTE — Patient Instructions (Signed)
Take omeprazole 40 mg by mouth 30 minutes before breakfast daily. Remember to take Plavix/clopidogrel in the evening to minimize interaction with omeprazole. Please call office with progress report in 4 weeks.

## 2020-07-03 NOTE — Progress Notes (Addendum)
Presenting complaint;  Follow-up for GERD and dysphagia.  Database and subjective:  Patient is 84 year old Caucasian female who has chronic GERD who is here for scheduled visit accompanied by her friend DeeDee. She underwent esophagogastroduodenoscopy with esophageal dilation on 05/02/2020.  She had small sliding hiatal hernia nonobstructive Schatzki's ring and gastritis.  Esophagus was dilated by passing 54 [place] Maloney dilator resulting in mucosal disruption at the proximal esophagus suggestive of a web. She reports significant but incomplete resolution of her dysphagia.  She has good and bad days.  She tries to eat slowly and chew her food thoroughly.  She has not had any episode of food impaction since esophageal dilation. She feels heartburn is not well controlled with generic Nexium.  Her insurance would not allow her brand-name medication.  She does complain of intermittent regurgitation resulting in emesis of small amount of food debris.  No history of hematemesis.  She denies sore throat or cough but she has hoarseness.  She reports right knee rash.  She is not using Voltaren gel anymore.  She has an appointment to see Dr. Nevada Crane. She says she is going to see Dr. Lovena Le regarding her arrhythmia later this week. She says her appetite is fair.  She has lost 4 pounds since her last visit.  She states she monitors her weight closely and takes a fluid pill possibly 3-4 times a week.  Current Medications: Outpatient Encounter Medications as of 07/03/2020  Medication Sig  . clopidogrel (PLAVIX) 75 MG tablet Take 1 tablet (75 mg total) by mouth daily.  . Cyanocobalamin (VITAMIN B-12) 5000 MCG SUBL Take 5,000 mcg by mouth daily.  . diclofenac Sodium (VOLTAREN) 1 % GEL Apply 2-4 g topically every 6 (six) hours as needed (leg/knee pain.).   Marland Kitchen esomeprazole (NEXIUM) 40 MG capsule Take 40 mg by mouth daily.   . folic acid (FOLVITE) 767 MCG tablet Take 400 mcg by mouth daily.  . furosemide (LASIX) 40  MG tablet Take 40 mg by mouth See admin instructions. Take 1 tablet (40 mg) by mouth (scheduled) in the morning, may take an additional dose if needed for swelling.  . irbesartan (AVAPRO) 150 MG tablet TAKE (1) TABLET BY MOUTH ONCE DAILY. (Patient taking differently: Take 150 mg by mouth daily at 2 PM. )  . LORazepam (ATIVAN) 1 MG tablet Take 1 mg by mouth 2 (two) times daily.  . metoprolol succinate (TOPROL-XL) 25 MG 24 hr tablet Take 3 tablets (75 mg total) by mouth in the morning. & 50 mg in the evening  . nitroGLYCERIN (NITROSTAT) 0.4 MG SL tablet Place 0.4 mg under the tongue every 5 (five) minutes x 3 doses as needed for chest pain.   Marland Kitchen ondansetron (ZOFRAN) 4 MG tablet Take 4 mg by mouth every 6 (six) hours as needed for nausea or vomiting.   . potassium chloride SA (K-DUR,KLOR-CON) 20 MEQ tablet Take 20 mEq by mouth See admin instructions. Takes 1 tablet (20 meq) by mouth daily (scheduled) & a 2nd tablet only if has to take a 2nd dose of Lasix  . primidone (MYSOLINE) 50 MG tablet Take 50 mg by mouth at bedtime.   Marland Kitchen PROAIR HFA 108 (90 Base) MCG/ACT inhaler Inhale 1 puff into the lungs every 4 (four) hours as needed for wheezing or shortness of breath.   . ALPRAZolam (XANAX) 1 MG tablet Take 1 mg by mouth 2 (two) times daily as needed for anxiety or sleep.  (Patient not taking: Reported on 07/03/2020)  . amiodarone (  PACERONE) 200 MG tablet Take 200 mg Two Times Daily for 1 month then start 200 mg Daily (Patient not taking: Reported on 07/03/2020)  . LORazepam (ATIVAN) 0.5 MG tablet Take 0.5 mg by mouth 2 (two) times daily. (Patient not taking: Reported on 07/03/2020)  . Multiple Vitamin (MULTIVITAMIN WITH MINERALS) TABS tablet Take 1 tablet by mouth daily. (Patient not taking: Reported on 07/03/2020)  . rosuvastatin (CRESTOR) 20 MG tablet Take 20 mg by mouth at bedtime. (Patient not taking: Reported on 07/03/2020)   No facility-administered encounter medications on file as of 07/03/2020.      Objective: Blood pressure (!) 204/113, pulse 80, temperature 97.7 F (36.5 C), temperature source Oral, height _0  (1.651 m), weight 144 lb 3.2 oz (65.4 kg). Patient is alert and in no acute distress. She is wearing a mask. She has prominent tremors to her hands. Conjunctiva is pink. Sclera is nonicteric Oropharyngeal mucosa is normal. She has upper lower dentures in place. No neck masses or thyromegaly noted. Cardiac exam with irregular rhythm normal S1 and S2. No murmur or gallop noted. Lungs are clear to auscultation. Abdomen is symmetrical soft and nontender with no organomegaly or masses. No LE edema or clubbing noted. She has focal area of erythematous rash along anterolateral aspect of right knee.  Labs/studies Results:  CBC Latest Ref Rng & Units 05/01/2020 05/25/2017 03/24/2017  WBC 4.0 - 10.5 K/uL 6.6 6.8 5.7  Hemoglobin 12.0 - 15.0 g/dL 13.1 13.0 13.3  Hematocrit 36 - 46 % 42.6 40.9 40.5  Platelets 150 - 400 K/uL 211 199 230    CMP Latest Ref Rng & Units 05/01/2020 06/01/2018 05/25/2017  Glucose 70 - 99 mg/dL 89 109(H) 112(H)  BUN 8 - 23 mg/dL _1 Creatinine 0.44 - 1.00 mg/dL 0.52 0.74 0.71  Sodium 135 - 145 mmol/L 140 141 138  Potassium 3.5 - 5.1 mmol/L 4.0 4.0 3.6  Chloride 98 - 111 mmol/L 103 104 103  CO2 22 - 32 mmol/L _2 Calcium 8.9 - 10.3 mg/dL 10.0 9.6 9.7  Total Protein 6.5 - 8.1 g/dL - 8.3(H) 7.9  Total Bilirubin 0.3 - 1.2 mg/dL - 0.7 0.3  Alkaline Phos 38 - 126 U/L - 80 84  AST 15 - 41 U/L - 23 25  ALT 0 - 44 U/L - 14 20    Hepatic Function Latest Ref Rng & Units 06/01/2018 05/25/2017 03/24/2017  Total Protein 6.5 - 8.1 g/dL 8.3(H) 7.9 7.2  Albumin 3.5 - 5.0 g/dL 4.0 4.0 4.0  AST 15 - 41 U/L _3 ALT 0 - 44 U/L _4 Alk Phosphatase 38 - 126 U/L 80 84 84  Total Bilirubin 0.3 - 1.2 mg/dL 0.7 0.3 0.5  Bilirubin, Direct <=0.2 mg/dL - - 0.1      Assessment:  #1.  Esophageal dysphagia.  She has a history of Schatzki's ring.   Recent EGD suggested esophageal web.  However she is also suspected to have esophageal motility disorder.  She has experienced some improvement with esophageal dilation.  Remains to be seen how long it would last.  #2.  Chronic GERD.  Symptoms not well controlled with esomeprazole.  Will change her to omeprazole.  Plan:  Medication list updated.  Duplicates removed. Discontinue esomeprazole. Begin omeprazole 40 mg by mouth 30 minutes before breakfast daily. Patient advised to take Plavix/clopidogrel in the evening to minimize interaction with PPI. Patient will call with progress report in 4 weeks otherwise  will consider changing to yet another PPI. Office visit in 1 year.

## 2020-07-06 ENCOUNTER — Other Ambulatory Visit: Payer: Self-pay

## 2020-07-06 ENCOUNTER — Encounter: Payer: Self-pay | Admitting: Internal Medicine

## 2020-07-06 ENCOUNTER — Ambulatory Visit: Payer: Medicare Other | Admitting: Internal Medicine

## 2020-07-06 VITALS — BP 140/74 | HR 80 | Ht 65.5 in | Wt 147.0 lb

## 2020-07-06 DIAGNOSIS — Z950 Presence of cardiac pacemaker: Secondary | ICD-10-CM

## 2020-07-06 DIAGNOSIS — I10 Essential (primary) hypertension: Secondary | ICD-10-CM | POA: Diagnosis not present

## 2020-07-06 DIAGNOSIS — I480 Paroxysmal atrial fibrillation: Secondary | ICD-10-CM | POA: Diagnosis not present

## 2020-07-06 DIAGNOSIS — I495 Sick sinus syndrome: Secondary | ICD-10-CM

## 2020-07-06 LAB — CUP PACEART INCLINIC DEVICE CHECK
Battery Impedance: 2059 Ohm
Battery Remaining Longevity: 34 mo
Battery Voltage: 2.75 V
Brady Statistic AP VP Percent: 1 %
Brady Statistic AP VS Percent: 67 %
Brady Statistic AS VP Percent: 0 %
Brady Statistic AS VS Percent: 32 %
Date Time Interrogation Session: 20211203100932
Implantable Lead Implant Date: 20100526
Implantable Lead Implant Date: 20100526
Implantable Lead Location: 753859
Implantable Lead Location: 753860
Implantable Lead Model: 5076
Implantable Lead Model: 5076
Implantable Pulse Generator Implant Date: 20100526
Lead Channel Impedance Value: 428 Ohm
Lead Channel Impedance Value: 438 Ohm
Lead Channel Pacing Threshold Amplitude: 0.25 V
Lead Channel Pacing Threshold Amplitude: 0.5 V
Lead Channel Pacing Threshold Pulse Width: 0.4 ms
Lead Channel Pacing Threshold Pulse Width: 0.4 ms
Lead Channel Sensing Intrinsic Amplitude: 11.2 mV
Lead Channel Sensing Intrinsic Amplitude: 2 mV
Lead Channel Setting Pacing Amplitude: 2 V
Lead Channel Setting Pacing Amplitude: 2.5 V
Lead Channel Setting Pacing Pulse Width: 0.4 ms
Lead Channel Setting Sensing Sensitivity: 2.8 mV

## 2020-07-06 MED ORDER — ASPIRIN EC 81 MG PO TBEC: 81.0000 mg | DELAYED_RELEASE_TABLET | Freq: Every day | ORAL | 3 refills | Status: DC

## 2020-07-06 MED ORDER — AMIODARONE HCL 200 MG PO TABS: ORAL_TABLET | ORAL | 3 refills | Status: DC

## 2020-07-06 NOTE — Progress Notes (Signed)
HPI [name] returns today for followup. She is a pleasant 84 yo woman with a h/o HTN, persistent atrial fib, symptomatic tachy-brady syndrome s/p PPM insertion. In the interim, she has done well. She has minimal palpitations. She has not had syncope. She has no edema.  Allergies  Allergen Reactions  . Amitriptyline Hcl Other (See Comments)    Caused "jaws to twist and lock"  . Sulfonamide Derivatives Other (See Comments)    UNKNOWN REACTION     Current Outpatient Medications  Medication Sig Dispense Refill  . amiodarone (PACERONE) 200 MG tablet Take 200 mg Two Times Daily for 1 month then start 200 mg Daily (Patient not taking: Reported on 07/03/2020) 120 tablet 3  . clopidogrel (PLAVIX) 75 MG tablet Take 1 tablet (75 mg total) by mouth daily.    . Cyanocobalamin (VITAMIN B-12) 5000 MCG SUBL Take 5,000 mcg by mouth daily.    . diclofenac Sodium (VOLTAREN) 1 % GEL Apply 2-4 g topically every 6 (six) hours as needed (leg/knee pain.).     Marland Kitchen folic acid (FOLVITE) 762 MCG tablet Take 400 mcg by mouth daily.    . furosemide (LASIX) 40 MG tablet Take 40 mg by mouth See admin instructions. Take 1 tablet (40 mg) by mouth (scheduled) in the morning, may take an additional dose if needed for swelling.    . irbesartan (AVAPRO) 150 MG tablet TAKE (1) TABLET BY MOUTH ONCE DAILY. (Patient taking differently: Take 150 mg by mouth daily at 2 PM. ) 90 tablet 3  . LORazepam (ATIVAN) 0.5 MG tablet Take 0.5 mg by mouth 2 (two) times daily. (Patient not taking: Reported on 07/03/2020)    . metoprolol succinate (TOPROL-XL) 25 MG 24 hr tablet Take 3 tablets (75 mg total) by mouth in the morning. & 50 mg in the evening 450 tablet 3  . Multiple Vitamin (MULTIVITAMIN WITH MINERALS) TABS tablet Take 1 tablet by mouth daily. (Patient not taking: Reported on 07/03/2020)    . nitroGLYCERIN (NITROSTAT) 0.4 MG SL tablet Place 0.4 mg under the tongue every 5 (five) minutes x 3 doses as needed for chest pain.       Marland Kitchen omeprazole (PRILOSEC) 40 MG capsule Take 1 capsule (40 mg total) by mouth daily before breakfast. 30 capsule 5  . ondansetron (ZOFRAN) 4 MG tablet Take 4 mg by mouth every 6 (six) hours as needed for nausea or vomiting.     . potassium chloride SA (K-DUR,KLOR-CON) 20 MEQ tablet Take 20 mEq by mouth See admin instructions. Takes 1 tablet (20 meq) by mouth daily (scheduled) & a 2nd tablet only if has to take a 2nd dose of Lasix    . primidone (MYSOLINE) 50 MG tablet Take 50 mg by mouth at bedtime.     Marland Kitchen PROAIR HFA 108 (90 Base) MCG/ACT inhaler Inhale 1 puff into the lungs every 4 (four) hours as needed for wheezing or shortness of breath.     . rosuvastatin (CRESTOR) 20 MG tablet Take 20 mg by mouth at bedtime. (Patient not taking: Reported on 07/03/2020)     No current facility-administered medications for this visit.     Past Medical History:  Diagnosis Date  . Anemia   . Anxiety   . Atrial fibrillation   . Chronic back pain   . COPD (chronic obstructive pulmonary disease) ([place])   . Coronary atherosclerosis of native coronary artery    DES x 2 to RCA 10/10  . 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 Essential hypertension   . GERD (gastroesophageal reflux disease)   . H/O hiatal hernia   . Headache(784.0)   . HOH (hard of hearing)   . Hyperlipidemia   . Neuromuscular disorder ([place])    Tremors  . Pericardial effusion    Hemorrhagic   . Presence of permanent cardiac pacemaker   . Tachycardia-bradycardia syndrome ([place])    s/p Medtronic Adapta L dual chamber device  5/10    ROS:   All systems reviewed and negative except as noted in the HPI.   Past Surgical History:  Procedure Laterality Date  . APPENDECTOMY    . CARDIAC CATHETERIZATION  2010   stents x2.  Marland Kitchen CATARACT EXTRACTION    . CHOLECYSTECTOMY    . COLONOSCOPY  2011  . COLONOSCOPY N/A 10/19/2014   Procedure: COLONOSCOPY;  Surgeon: Rogene Houston, MD;  Location: [facility] ENDO [facility];  Service:  Endoscopy;  Laterality: N/A;  1030  . ESOPHAGEAL DILATION N/A 05/02/2020   Procedure: ESOPHAGEAL DILATION;  Surgeon: Rogene Houston, MD;  Location: [facility] ENDO SUITE;  Service: Gastroenterology;  Laterality: N/A;  . ESOPHAGOGASTRODUODENOSCOPY N/A 10/26/2014   Procedure: ESOPHAGOGASTRODUODENOSCOPY (EGD);  Surgeon: Rogene Houston, MD;  Location: [facility] END[facility];  Service: Endoscopy;  Laterality: N/A;  230 - Dr. has lunch and learn  . ESOPHAGOGASTRODUODENOSCOPY (EGD) WITH PROPOFOL N/A 05/02/2020   Procedure: ESOPHAGOGASTRODUODENOSCOPY (EGD) WITH PROPOFOL;  Surgeon: Rogene Houston, MD;  Location: [facility];  Service: Gastroenterology;  Laterality: N/A;  250  . Esophagogastroduodenoscopy with esophageal dilation  2004, 2006, 2007  . GIVENS CAPSULE STUDY N/A 10/31/2014   Procedure: GIVENS CAPSULE STUDY;  Surgeon: Rogene Houston, MD;  Location: [facility];  Service: Endoscopy;  Laterality: N/A;  730 -- pacemaker--needs monitoring--outpatient bed  . INSERT / REPLACE / REMOVE PACEMAKER  2010  . LEFT HEART CATHETERIZATION WITH CORONARY ANGIOGRAM N/A 11/17/2011   Procedure: LEFT HEART CATHETERIZATION WITH CORONARY ANGIOGRAM;  Surgeon: Sherren Mocha, MD;  Location[facility] CATH LAB;  Service: Cardiovascular;  Laterality: N/A;  . LEFT HEART CATHETERIZATION WITH CORONARY ANGIOGRAM N/A 09/26/2014   Procedure: LEFT HEART CATHETERIZATION WITH CORONARY ANGIOGRAM;  Surgeon: Leonie Man, MD;  Location: [facility] CATH LAB;  Service: Cardiovascular;  Laterality: N/A;  . Right rotator cuff repair    . SHOULDER ACROMIOPLASTY Right 05/30/2015   Procedure: RIGHT SHOULDER ACROMIOPLASTY;  Surgeon: Carole Civil, MD;  Location: [facility] ORS;  Service: Orthopedics;  Laterality: Right;  . SHOULDER OPEN ROTATOR CUFF REPAIR Right 05/30/2015   Procedure: OPEN ROTATOR CUFF REPAIR RIGHT SHOULDER;  Surgeon: Carole Civil, MD;  Location: [facility] ORS;  Service: Orthopedics;  Laterality: Right;  . SHOULDER OPEN ROTATOR CUFF REPAIR Left  10/22/2016   Procedure: ROTATOR CUFF REPAIR SHOULDER OPEN;  Surgeon: Carole Civil, MD;  Location: [facility] ORS;  Service: Orthopedics;  Laterality: Left;  . Subxiphoid pericardial window  11/10  . VAGINAL HYSTERECTOMY       Family History  Problem Relation Age of Onset  . Cancer Mother        Colon   . Coronary artery disease Sister   . Coronary artery disease Brother   . Arthritis Other   . Lung disease Other   . Asthma Other      Social History   Socioeconomic History  . Marital status: Widowed    Spouse name: Not on file  . Number of children: 1  . Years of education: Not on file  .  Highest education level: 8th grade  Occupational History  . Occupation: Retired    Fish farm manager: RETIRED  Tobacco Use  . Smoking status: Former Smoker    Packs/day: 2.00    Years: 10.00    Pack years: 20.00    Types: Cigarettes    Quit date: 05/23/1989    Years since quitting: 31.1  . Smokeless tobacco: Never Used  Vaping Use  . Vaping Use: Never used  Substance and Sexual Activity  . Alcohol use: No    Alcohol/week: 0.0 standard drinks  . Drug use: No  . Sexual activity: Never  Other Topics Concern  . Not on file  Social History Narrative   She lives alone, has adopted daughter.   Social Determinants of Health   Financial Resource Strain:   . Difficulty of Paying Living Expenses: Not on file  Food Insecurity: No Food Insecurity  . Worried About Charity fundraiser in the Last Year: Never true  . Ran Out of Food in the Last Year: Never true  Transportation Needs: No Transportation Needs  . Lack of Transportation (Medical): No  . Lack of Transportation (Non-Medical): No  Physical Activity:   . Days of Exercise per Week: Not on file  . Minutes of Exercise per Session: Not on file  Stress:   . Feeling of Stress : Not on file  Social Connections:   . Frequency of Communication with Friends and Family: Not on file  . Frequency of Social Gatherings with Friends and Family: Not  on file  . Attends Religious Services: Not on file  . Active Member of Clubs or Organizations: Not on file  . Attends Archivist Meetings: Not on file  . Marital Status: Not on file  Intimate Partner Violence:   . Fear of Current or Ex-Partner: Not on file  . Emotionally Abused: Not on file  . Physically Abused: Not on file  . Sexually Abused: Not on file     Ht 5' 5.5" (1.664 m)   Wt 147 lb (66.7 kg)   BMI 24.09 kg/m   Physical Exam:  Well appearing NAD HEENT: Unremarkable Neck:  6 cm JVD, no thyromegally Lymphatics:  No adenopathy Back:  No CVA tenderness Lungs:  Clear with no wheezes HEART:  Regular rate rhythm, no murmurs, no rubs, no clicks Abd:  soft, positive bowel sounds, no organomegally, no rebound, no guarding Ext:  2 plus pulses, no edema, no cyanosis, no clubbing Skin:  No rashes no nodules Neuro:  CN II through XII intact, motor grossly intact  DEVICE  Normal device function.  See PaceArt for details.   Assess/Plan: 1. Atrial fib - she is maintaining NSR almost 99% of the time. I have asked her to reduce her amiodarone so that she takes one tab daily, none on Sunday. 2. PPM -her medtronic DDD PM is working normally. We will recheck in several months. 3. Coags - she refuses an [place] or warfarin. I will switch her to ASA as per her request.  4. HTN - her bp is fairly well controlled. She is encouraged to avoid salty foods.  Carleene Overlie Aneeka Bowden,MD

## 2020-07-06 NOTE — Patient Instructions (Signed)
Medication Instructions:  Your physician has recommended you make the following change in your medication:   Stop taking Plavix  Start Taking Aspirin  Decrease Amiodarone to 200 mg Daily NONE on Sunday   *If you need a refill on your cardiac medications before your next appointment, please call your pharmacy*   Lab Work: NONE   If you have labs (blood work) drawn today and your tests are completely normal, you will receive your results only by: Marland Kitchen MyChart Message (if you have MyChart) OR . A paper copy in the mail If you have any lab test that is abnormal or we need to change your treatment, we will call you to review the results.   Testing/Procedures: NONE    Follow-Up: At [facility], you and your health needs are our priority.  As part of our continuing mission to provide you with exceptional heart care, we have created designated Provider Care Teams.  These Care Teams include your primary Cardiologist (physician) and Advanced Practice Providers (APPs -  Physician Assistants and Nurse Practitioners) who all work together to provide you with the care you need, when you need it.  We recommend signing up for the patient portal called "MyChart".  Sign up information is provided on this After Visit Summary.  MyChart is used to connect with patients for Virtual Visits (Telemedicine).  Patients are able to view lab/test results, encounter notes, upcoming appointments, etc.  Non-urgent messages can be sent to your provider as well.   To learn more about what you can do with MyChart, go to [email or web address].    Your next appointment:   1 year(s)  The format for your next appointment:   In Person  Provider:   [name], MD   Other Instructions Thank you for choosing [facility]!

## 2020-07-18 ENCOUNTER — Other Ambulatory Visit: Payer: Self-pay | Admitting: *Deleted

## 2020-07-18 NOTE — Patient Outreach (Addendum)
[facility]) Care Management  [facility] Care Manager  07/18/2020   [name] 09-Dec-1935 [id number]  Subjective: Successful telephone outreach call to patient. HIPAA identifiers obtained. Patient states she is feeling some better. Patient shared that she forgot to call [employer] and order on 07/10/20 and she is not receiving delivered meals. Nurse stated she would contact CSW to see if she can call the agency and restart her meal delivery. Patient recently saw Dr. Dahlia Bailiff on 07/03/20 who sees her for her swallowing issues and also saw Dr. Ladona Ridgel who follows her Afib on 07/06/20. Nurse reviewed both providers medication changes and notes with the patient. Patient supplements with Ensure and explains that she has been able to maintain her weight thus far. Patient denies having any falls and states her home environment is safe. She explains that her niece does her grocery shopping and she has other family and friends who transports her to her provider's appointments. Patient reports that her weight is 145 pounds and that it has remained stable. Patient's blood pressure today was 147/86 and patient states she has received some high values around 160's over 90's which per patient is not unusual. The patient is unable to write down her values due to severe chronic hand tremors. Nurse educated the patient to bring her B/P monitor to her upcoming appointment with cardiologist  Dr. Diona Browner and encouraged her to call to inform him of high B/P readings that stay consistent. Patient does monitor her sodium intake ans tries not to be sedentary during the day. Patient verbalized understanding. Patient did not have any further questions or concerns today and did confirm that she has this nurse's contact number to call her if needed.   Update: 07/18/20 1453 Nurse received response back from CSW and called the patient back to inform her that [employer] is scheduled to automatically deliver to her 14 meals  monthly for a 6 month period. It was discussed that the patient did not need to call them to order the shipment. Patient was appreciative for the callback.   Encounter Medications:  Outpatient Encounter Medications as of 07/18/2020  Medication Sig Note  . amiodarone (PACERONE) 200 MG tablet Take 200 mg Daily Monday - Saturday None on Sunday   . aspirin EC 81 MG tablet Take 1 tablet (81 mg total) by mouth daily. Swallow whole.   . Cyanocobalamin (VITAMIN B-12) 5000 MCG SUBL Take 5,000 mcg by mouth daily.   . diclofenac Sodium (VOLTAREN) 1 % GEL Apply 2-4 g topically every 6 (six) hours as needed (leg/knee pain.).    Marland Kitchen folic acid (FOLVITE) 400 MCG tablet Take 400 mcg by mouth daily.   . furosemide (LASIX) 40 MG tablet Take 40 mg by mouth See admin instructions. Take 1 tablet (40 mg) by mouth (scheduled) in the morning, may take an additional dose if needed for swelling.   . irbesartan (AVAPRO) 150 MG tablet TAKE (1) TABLET BY MOUTH ONCE DAILY. (Patient taking differently: Take 150 mg by mouth daily at 2 PM. )   . LORazepam (ATIVAN) 0.5 MG tablet Take 0.5 mg by mouth 2 (two) times daily.    . metoprolol succinate (TOPROL-XL) 25 MG 24 hr tablet Take 3 tablets (75 mg total) by mouth in the morning. & 50 mg in the evening   . Multiple Vitamin (MULTIVITAMIN WITH MINERALS) TABS tablet Take 1 tablet by mouth daily.    . nitroGLYCERIN (NITROSTAT) 0.4 MG SL tablet Place 0.4 mg under the tongue every  5 (five) minutes x 3 doses as needed for chest pain.  09/25/2014: .  . omeprazole (PRILOSEC) 40 MG capsule Take 1 capsule (40 mg total) by mouth daily before breakfast.   . ondansetron (ZOFRAN) 4 MG tablet Take 4 mg by mouth every 6 (six) hours as needed for nausea or vomiting.    . potassium chloride SA (K-DUR,KLOR-CON) 20 MEQ tablet Take 20 mEq by mouth See admin instructions. Takes 1 tablet (20 meq) by mouth daily (scheduled) & a 2nd tablet only if has to take a 2nd dose of Lasix   . primidone (MYSOLINE) 50 MG  tablet Take 50 mg by mouth at bedtime.    Marland Kitchen PROAIR HFA 108 (90 Base) MCG/ACT inhaler Inhale 1 puff into the lungs every 4 (four) hours as needed for wheezing or shortness of breath.    . rosuvastatin (CRESTOR) 20 MG tablet Take 20 mg by mouth at bedtime.    . triamcinolone (KENALOG) 0.025 % cream SMARTSIG:Sparingly Topical Twice Daily    No facility-administered encounter medications on file as of 07/18/2020.    Functional Status:  In your present state of health, do you have any difficulty performing the following activities: 05/01/2020  Hearing? N  Vision? N  Difficulty concentrating or making decisions? N  Walking or climbing stairs? Y  Dressing or bathing? N  Doing errands, shopping? N  Some recent data might be hidden    Fall/Depression Screening: Fall Risk  06/11/2020 06/11/2020 05/07/2020  Falls in the past year? 1 1 1   Number falls in past yr: 0 0 0  Injury with Fall? 0 0 0  Risk for fall due to : History of fall(s);Impaired balance/gait;Impaired mobility History of fall(s);Impaired mobility;Impaired balance/gait Impaired mobility  Follow up Falls prevention discussed;Education provided;Falls evaluation completed Falls prevention discussed;Education provided;Falls evaluation completed Falls prevention discussed;Education provided;Falls evaluation completed   PHQ 2/9 Scores 05/07/2020 04/05/2020 12/25/2015  PHQ - 2 Score 0 1 0  Exception Documentation - (No Data) -    Assessment:  Goals Addressed            This Visit's Progress   . COMPLETED: Make and Keep All Appointments       Follow Up Date 06/11/20    - ask family or friend for a ride - call to cancel if needed - keep a calendar with appointment dates    Why is this important?   Part of staying healthy is seeing the doctor for follow-up care.  If you forget your appointments, there are some things you can do to stay on track.    Notes: Patient keeps track of her appointments and states she does have transportation  to her provider appointments.  Updated: 06/11/20    . COMPLETED: Manage My Medicine        Follow Up Date 06/11/20   - call for medicine refill 2 or 3 days before it runs out - call if I am sick and can't take my medicine - keep a list of all the medicines I take; vitamins and herbals too - learn to read medicine labels    Why is this important?   These steps will help you keep on track with your medicines.    Notes: Per patient she does not have difficulty managing her medications. She can afford them and does not have any questions about them  Updated: 06/11/20    . Track and Manage My Blood Pressure   On track    Timeframe:  Long-Range Goal  Priority:  High Start Date: 05/07/20                            Expected End Date: 01/31/21                      Follow Up Date 10/31/20    - check blood pressure daily - write blood pressure results in a log or diary    Why is this important?   You won't feel high blood pressure, but it can still hurt your blood vessels.  High blood pressure can cause heart or kidney problems. It can also cause a stroke.  Making lifestyle changes like losing a little weight or eating less salt will help.  Checking your blood pressure at home and at different times of the day can help to control blood pressure.  If the doctor prescribes medicine remember to take it the way the doctor ordered.  Call the office if you cannot afford the medicine or if there are questions about it.     Notes: Patient received the scale, B/P cuff, Hypertension and CHF education. Patient states she is taking her weight and B/P daily but is unable to write down her daily values due to severe tremors. Nurse reviewed signs and symptoms action plans and zones. Updated: 07/18/20    . Track and Manage Symptoms   On track    Timeframe:  Long-Range Goal Priority:  High Start Date: 05/07/20                            Expected End Date: 01/31/21                      Follow Up Date  10/31/20   - eat more whole grains, fruits and vegetables, lean meats and healthy fats - follow rescue plan if symptoms flare-up - know when to call the doctor - track symptoms and what helps feel better or worse    Why is this important?   You will be able to handle your symptoms better if you keep track of them.  Making some simple changes to your lifestyle will help.  Eating healthy is one thing you can do to take good care of yourself.    Notes: Patient received the scale, B/P cuff, Hypertension and CHF education. Patient states she is taking her weight and B/P daily but is unable to write down her daily values due to severe tremors. Nurse reviewed signs and symptoms action plans and zones. Updated: 07/18/20       Plan: RN Health Coach will send the patient Ensure coupons and will call patient within the month of March. Follow-up:  Patient agrees to Care Plan and Follow-up.   Blanchie Serve RN, BSN [facility] Care Management  RN Health Coach [phone] [email or web address]

## 2020-07-18 NOTE — Patient Instructions (Signed)
Goals Addressed            This Visit's Progress   . COMPLETED: Make and Keep All Appointments       Follow Up Date 06/11/20    - ask family or friend for a ride - call to cancel if needed - keep a calendar with appointment dates    Why is this important?   Part of staying healthy is seeing the doctor for follow-up care.  If you forget your appointments, there are some things you can do to stay on track.    Notes: Patient keeps track of her appointments and states she does have transportation to her provider appointments.  Updated: 06/11/20    . COMPLETED: Manage My Medicine        Follow Up Date 06/11/20   - call for medicine refill 2 or 3 days before it runs out - call if I am sick and can't take my medicine - keep a list of all the medicines I take; vitamins and herbals too - learn to read medicine labels    Why is this important?   These steps will help you keep on track with your medicines.    Notes: Per patient she does not have difficulty managing her medications. She can afford them and does not have any questions about them  Updated: 06/11/20    . Remove Barriers to Better Nutrition       Timeframe:  Long-Range Goal Priority:  High Start Date: 07/18/20                            Expected End Date: 01/31/21                      Follow Up Date 10/31/20   - arrange meal delivery service like [employer] Wheels - ask for help with cooking and shopping - make food easier to swallow by adding gravy or drinking water between bites - take my supplements as prescribed    Why is this important?    Eating healthy is not always easy.   You may not feel hungry or food might not taste good.   It may be a lot of work to shop or make meals.   There are things you can do to make it easier.    Notes: Patient states she forgot to order her [employer] on 07/10/20 and she is not receiving any delivery. Nurse will inform SW about the Mom[employer] meals issue and see if she can call to  restart food delivery. Patient is followed by Dr. Laural Golden for her swallowing difficulties. Encouraged patient to eat soft foods that are easy to swallow and to drink water in between bites. Patient supplements with Ensure and nurse will send her Ensure coupons.     . Track and Manage My Blood Pressure   On track    Timeframe:  Long-Range Goal Priority:  High Start Date: 05/07/20                            Expected End Date: 01/31/21                      Follow Up Date 10/31/20    - check blood pressure daily - write blood pressure results in a log or diary    Why is this important?  You won't feel high blood pressure, but it can still hurt your blood vessels.  High blood pressure can cause heart or kidney problems. It can also cause a stroke.  Making lifestyle changes like losing a little weight or eating less salt will help.  Checking your blood pressure at home and at different times of the day can help to control blood pressure.  If the doctor prescribes medicine remember to take it the way the doctor ordered.  Call the office if you cannot afford the medicine or if there are questions about it.     Notes: Patient received the scale, B/P cuff, Hypertension and CHF education. Patient states she is taking her weight and B/P daily but is unable to write down her daily values due to severe tremors. Nurse reviewed signs and symptoms action plans and zones. Updated: 07/18/20    . Track and Manage Symptoms   On track    Timeframe:  Long-Range Goal Priority:  High Start Date: 05/07/20                            Expected End Date: 01/31/21                      Follow Up Date 10/31/20   - eat more whole grains, fruits and vegetables, lean meats and healthy fats - follow rescue plan if symptoms flare-up - know when to call the doctor - track symptoms and what helps feel better or worse    Why is this important?   You will be able to handle your symptoms better if you keep track of them.   Making some simple changes to your lifestyle will help.  Eating healthy is one thing you can do to take good care of yourself.    Notes: Patient received the scale, B/P cuff, Hypertension and CHF education. Patient states she is taking her weight and B/P daily but is unable to write down her daily values due to severe tremors. Nurse reviewed signs and symptoms action plans and zones. Updated: 07/18/20

## 2020-07-29 DIAGNOSIS — I2510 Atherosclerotic heart disease of native coronary artery without angina pectoris: Secondary | ICD-10-CM | POA: Diagnosis not present

## 2020-07-29 DIAGNOSIS — R0602 Shortness of breath: Secondary | ICD-10-CM | POA: Diagnosis not present

## 2020-08-07 ENCOUNTER — Encounter: Payer: Self-pay | Admitting: Emergency Medicine

## 2020-08-07 ENCOUNTER — Ambulatory Visit
Admission: EM | Admit: 2020-08-07 | Discharge: 2020-08-07 | Disposition: A | Discharge status: Home / Self Care | Payer: Medicare Other | Attending: Family Medicine | Admitting: Family Medicine

## 2020-08-07 ENCOUNTER — Telehealth: Payer: Self-pay | Admitting: Cardiology

## 2020-08-07 DIAGNOSIS — R21 Rash and other nonspecific skin eruption: Secondary | ICD-10-CM | POA: Diagnosis not present

## 2020-08-07 DIAGNOSIS — L249 Irritant contact dermatitis, unspecified cause: Secondary | ICD-10-CM | POA: Diagnosis not present

## 2020-08-07 MED ORDER — CLOBETASOL PROPIONATE 0.05 % EX CREA: 1.0000 "application " | TOPICAL_CREAM | Freq: Two times a day (BID) | CUTANEOUS | 0 refills | Status: DC

## 2020-08-07 NOTE — Telephone Encounter (Signed)
Patient is having swelling in left leg, feet and ankle. Would like for a nurse to give a call [phone]

## 2020-08-07 NOTE — Discharge Instructions (Addendum)
Half tablet of benadryl at bedtime  I have sent in clobetasol for you to use twice a day   Follow up with this office or with primary care if symptoms are persisting.  Follow up in the ER for high fever, trouble swallowing, trouble breathing, other concerning symptoms.

## 2020-08-07 NOTE — Telephone Encounter (Signed)
Called patient. Voicemail box was full. Will try again soon.

## 2020-08-07 NOTE — ED Triage Notes (Signed)
Redness and swelling to left lower leg

## 2020-08-08 NOTE — Telephone Encounter (Signed)
Called patient who informed me that she went to the Urgent Care due to a rash and she believes she dropped something on her leg to cause it to swell. She stated she would be making an appointment with her PCP to get X-Rays to make sure it is not broken.

## 2020-08-12 NOTE — ED Provider Notes (Signed)
[place]   [id number] 08/07/20 Arrival Time: [id number]  CC: RASH  SUBJECTIVE:  [name] is a 85 y.o. female who presents with a redness and swelling to the left lower leg over the last 2-3 days. Denies precipitating event or trauma. Denies changes in soaps, detergents, close contacts with similar rash, known trigger or environmental trigger, allergy. Denies medications change or starting a new medication recently. Localizes the rash to L ankle. Describes it as itchy. Has tried triamcinolone cream without relief. There are no aggravating or alleviating factors. Denies similar symptoms in the past. Denies fever, chills, nausea, vomiting, swelling, discharge, oral lesions, SOB, chest pain, abdominal pain, changes in bowel or bladder function.    ROS: As per HPI.  All other pertinent ROS negative.     Past Medical History:  Diagnosis Date  . Anemia   . Anxiety   . Atrial fibrillation   . Chronic back pain   . COPD (chronic obstructive pulmonary disease) ([place])   . Coronary atherosclerosis of native coronary artery    DES x 2 to RCA 10/10  . 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 Essential hypertension   . GERD (gastroesophageal reflux disease)   . H/O hiatal hernia   . Headache(784.0)   . HOH (hard of hearing)   . Hyperlipidemia   . Neuromuscular disorder ([place])    Tremors  . Pericardial effusion    Hemorrhagic   . Presence of permanent cardiac pacemaker   . Tachycardia-bradycardia syndrome ([place])    s/p Medtronic Adapta L dual chamber device  5/10   Past Surgical History:  Procedure Laterality Date  . APPENDECTOMY    . CARDIAC CATHETERIZATION  2010   stents x2.  Marland Kitchen CATARACT EXTRACTION    . CHOLECYSTECTOMY    . COLONOSCOPY  2011  . COLONOSCOPY N/A 10/19/2014   Procedure: COLONOSCOPY;  Surgeon: Rogene Houston, MD;  [facility] ENDO [facility];  Service: Endoscopy;  Laterality: N/A;  1030  . ESOPHAGEAL DILATION N/A 05/02/2020   Procedure:  ESOPHAGEAL DILATION;  Surgeon: Rogene Houston, MD;  Location: [facility] ENDO SUITE;  Service: Gastroenterology;  Laterality: N/A;  . ESOPHAGOGASTRODUODENOSCOPY N/A 10/26/2014   Procedure: ESOPHAGOGASTRODUODENOSCOPY (EGD);  Surgeon: Rogene Houston, MD;  Location: [facility] END[facility];  Service: Endoscopy;  Laterality: N/A;  230 - Dr. has lunch and learn  . ESOPHAGOGASTRODUODENOSCOPY (EGD) WITH PROPOFOL N/A 05/02/2020   Procedure: ESOPHAGOGASTRODUODENOSCOPY (EGD) WITH PROPOFOL;  Surgeon: Rogene Houston, MD;  Location: [facility];  Service: Gastroenterology;  Laterality: N/A;  250  . Esophagogastroduodenoscopy with esophageal dilation  2004, 2006, 2007  . GIVENS CAPSULE STUDY N/A 10/31/2014   Procedure: GIVENS CAPSULE STUDY;  Surgeon: Rogene Houston, MD;  Location: [facility];  Service: Endoscopy;  Laterality: N/A;  730 -- pacemaker--needs monitoring--outpatient bed  . INSERT / REPLACE / REMOVE PACEMAKER  2010  . LEFT HEART CATHETERIZATION WITH CORONARY ANGIOGRAM N/A 11/17/2011   Procedure: LEFT HEART CATHETERIZATION WITH CORONARY ANGIOGRAM;  Surgeon: Sherren Mocha, MD;  Location: [facility] CATH LAB;  Service: Cardiovascular;  Laterality: N/A;  . LEFT HEART CATHETERIZATION WITH CORONARY ANGIOGRAM N/A 09/26/2014   Procedure: LEFT HEART CATHETERIZATION WITH CORONARY ANGIOGRAM;  Surgeon: Leonie Man, MD;  Location: [facility] CATH LAB;  Service: Cardiovascular;  Laterality: N/A;  . Right rotator cuff repair    . SHOULDER ACROMIOPLASTY Right 05/30/2015   Procedure: RIGHT SHOULDER ACROMIOPLASTY;  Surgeon: Carole Civil, MD;  Location: [facility] ORS;  Service: Orthopedics;  Laterality: Right;  . SHOULDER OPEN ROTATOR CUFF REPAIR Right 05/30/2015   Procedure: OPEN ROTATOR CUFF REPAIR RIGHT SHOULDER;  Surgeon: Carole Civil, MD;  Location: [facility] ORS;  Service: Orthopedics;  Laterality: Right;  . SHOULDER OPEN ROTATOR CUFF REPAIR Left 10/22/2016   Procedure: ROTATOR CUFF REPAIR SHOULDER OPEN;  Surgeon: Carole Civil, MD;   Location: [facility] ORS;  Service: Orthopedics;  Laterality: Left;  . Subxiphoid pericardial window  11/10  . VAGINAL HYSTERECTOMY     Allergies  Allergen Reactions  . Amitriptyline Hcl Other (See Comments)    Caused "jaws to twist and lock"  . Sulfonamide Derivatives Other (See Comments)    UNKNOWN REACTION   No current facility-administered medications on file prior to encounter.   Current Outpatient Medications on File Prior to Encounter  Medication Sig Dispense Refill  . amiodarone (PACERONE) 200 MG tablet Take 200 mg Daily Monday - Saturday None on Sunday 90 tablet 3  . aspirin EC 81 MG tablet Take 1 tablet (81 mg total) by mouth daily. Swallow whole. 90 tablet 3  . Cyanocobalamin (VITAMIN B-12) 5000 MCG SUBL Take 5,000 mcg by mouth daily.    . diclofenac Sodium (VOLTAREN) 1 % GEL Apply 2-4 g topically every 6 (six) hours as needed (leg/knee pain.).     Marland Kitchen folic acid (FOLVITE) 742 MCG tablet Take 400 mcg by mouth daily.    . furosemide (LASIX) 40 MG tablet Take 40 mg by mouth See admin instructions. Take 1 tablet (40 mg) by mouth (scheduled) in the morning, may take an additional dose if needed for swelling.    . irbesartan (AVAPRO) 150 MG tablet TAKE (1) TABLET BY MOUTH ONCE DAILY. (Patient taking differently: Take 150 mg by mouth daily at 2 PM. ) 90 tablet 3  . LORazepam (ATIVAN) 0.5 MG tablet Take 0.5 mg by mouth 2 (two) times daily.     . metoprolol succinate (TOPROL-XL) 25 MG 24 hr tablet Take 3 tablets (75 mg total) by mouth in the morning. & 50 mg in the evening 450 tablet 3  . Multiple Vitamin (MULTIVITAMIN WITH MINERALS) TABS tablet Take 1 tablet by mouth daily.     . nitroGLYCERIN (NITROSTAT) 0.4 MG SL tablet Place 0.4 mg under the tongue every 5 (five) minutes x 3 doses as needed for chest pain.     Marland Kitchen omeprazole (PRILOSEC) 40 MG capsule Take 1 capsule (40 mg total) by mouth daily before breakfast. 30 capsule 5  . ondansetron (ZOFRAN) 4 MG tablet Take 4 mg by mouth every 6 (six)  hours as needed for nausea or vomiting.     . potassium chloride SA (K-DUR,KLOR-CON) 20 MEQ tablet Take 20 mEq by mouth See admin instructions. Takes 1 tablet (20 meq) by mouth daily (scheduled) & a 2nd tablet only if has to take a 2nd dose of Lasix    . primidone (MYSOLINE) 50 MG tablet Take 50 mg by mouth at bedtime.     Marland Kitchen PROAIR HFA 108 (90 Base) MCG/ACT inhaler Inhale 1 puff into the lungs every 4 (four) hours as needed for wheezing or shortness of breath.     . rosuvastatin (CRESTOR) 20 MG tablet Take 20 mg by mouth at bedtime.     . triamcinolone (KENALOG) 0.025 % cream SMARTSIG:Sparingly Topical Twice Daily     Social History   Socioeconomic History  . Marital status: Widowed    Spouse name: Not on file  . Number of children: 1  .  Years of education: Not on file  . Highest education level: 8th grade  Occupational History  . Occupation: Retired    Fish farm manager: RETIRED  Tobacco Use  . Smoking status: Former Smoker    Packs/day: 2.00    Years: 10.00    Pack years: 20.00    Types: Cigarettes    Quit date: 05/23/1989    Years since quitting: 31.2  . Smokeless tobacco: Never Used  Vaping Use  . Vaping Use: Never used  Substance and Sexual Activity  . Alcohol use: No    Alcohol/week: 0.0 standard drinks  . Drug use: No  . Sexual activity: Never  Other Topics Concern  . Not on file  Social History Narrative   She lives alone, has adopted daughter.   Social Determinants of Health   Financial Resource Strain: Not on file  Food Insecurity: No Food Insecurity  . Worried About Charity fundraiser in the Last Year: Never true  . Ran Out of Food in the Last Year: Never true  Transportation Needs: No Transportation Needs  . Lack of Transportation (Medical): No  . Lack of Transportation (Non-Medical): No  Physical Activity: Not on file  Stress: Not on file  Social Connections: Not on file  Intimate Partner Violence: Not on file   Family History  Problem Relation Age of Onset   . Cancer Mother        Colon   . Coronary artery disease Sister   . Coronary artery disease Brother   . Arthritis Other   . Lung disease Other   . Asthma Other     OBJECTIVE: Vitals:   08/07/20 1717 08/07/20 1718  BP: (!) 192/86   Pulse: 75   Resp: 18   Temp: (!) 97.3 F (36.3 C)   TempSrc: Oral   SpO2: 91%   Weight:  150 lb (68 kg)  Height:  5' 5.5" (1.664 m)    General appearance: alert; no distress Head: NCAT Lungs: clear to auscultation bilaterally Heart: regular rate and rhythm.  Radial pulse 2+ bilaterally Extremities: no edema Skin: warm and dry; macular erythematous rash to left lower leg and ankle, no warmth, no heat, non tender Psychological: alert and cooperative; normal mood and affect  ASSESSMENT & PLAN:  1. Irritant contact dermatitis, unspecified trigger   2. Rash and nonspecific skin eruption     Meds ordered this encounter  Medications  . clobetasol cream (TEMOVATE) 0.05 %    Sig: Apply 1 application topically 2 (two) times daily.    Dispense:  30 g    Refill:  0    Order Specific Question:   Supervising Provider    Answer:   Chase Picket [[id number]]   Prescribed clotrimazole May use twice a day as needed Avoid hot showers/ baths Moisturize skin daily  Follow up with PCP if symptoms persists Return or go to the ER if you have any new or worsening symptoms such as fever, chills, nausea, vomiting, redness, swelling, discharge, if symptoms do not improve with medications  Reviewed expectations re: course of current medical issues. Questions answered. Outlined signs and symptoms indicating need for more acute intervention. Patient verbalized understanding. After Visit Summary given.   Faustino Congress, NP 08/12/20 1929

## 2020-08-29 DIAGNOSIS — R0602 Shortness of breath: Secondary | ICD-10-CM | POA: Diagnosis not present

## 2020-08-29 DIAGNOSIS — I2510 Atherosclerotic heart disease of native coronary artery without angina pectoris: Secondary | ICD-10-CM | POA: Diagnosis not present

## 2020-09-03 ENCOUNTER — Other Ambulatory Visit: Payer: Self-pay

## 2020-09-03 ENCOUNTER — Encounter: Payer: Self-pay | Admitting: Cardiology

## 2020-09-03 ENCOUNTER — Ambulatory Visit: Payer: Medicare Other | Admitting: Cardiology

## 2020-09-03 VITALS — BP 148/82 | HR 55 | Ht 65.5 in | Wt 146.0 lb

## 2020-09-03 DIAGNOSIS — I480 Paroxysmal atrial fibrillation: Secondary | ICD-10-CM

## 2020-09-03 DIAGNOSIS — I495 Sick sinus syndrome: Secondary | ICD-10-CM | POA: Diagnosis not present

## 2020-09-03 DIAGNOSIS — I25119 Atherosclerotic heart disease of native coronary artery with unspecified angina pectoris: Secondary | ICD-10-CM

## 2020-09-03 NOTE — Patient Instructions (Signed)
Medication Instructions:  Your physician recommends that you continue on your current medications as directed. Please refer to the Current Medication list given to you today.  *If you need a refill on your cardiac medications before your next appointment, please call your pharmacy*  Lab Work: None today If you have labs (blood work) drawn today and your tests are completely normal, you will receive your results only by: . MyChart Message (if you have MyChart) OR . A paper copy in the mail If you have any lab test that is abnormal or we need to change your treatment, we will call you to review the results.  Testing/Procedures: None today  Follow-Up: At [facility], you and your health needs are our priority.  As part of our continuing mission to provide you with exceptional heart care, we have created designated Provider Care Teams.  These Care Teams include your primary Cardiologist (physician) and Advanced Practice Providers (APPs -  Physician Assistants and Nurse Practitioners) who all work together to provide you with the care you need, when you need it.  Your next appointment:   6 months  The format for your next appointment:   In Person  Provider:   Samuel McDowell, MD  Other Instructions None     Thank you for choosing [facility] !         

## 2020-09-03 NOTE — Progress Notes (Signed)
Cardiology Office Note  Date: 09/03/2020   ID: [name], DOB 1936-05-02, MRN [id number]  PCP:  Celene Squibb, MD  Cardiologist:  Rozann Lesches, MD Electrophysiologist:  [name], MD   Chief Complaint  Patient presents with  . Cardiac follow-up    History of Present Illness: [name] is an 85 y.o. female last seen in October 2021.  She presents for a routine visit, here today with a neighbor.  She states that she feels better in terms of palpitations and chest pain on current medications which we reviewed today.  She is tolerating amiodarone.  She follows with Dr. Lovena Le, Medtronic pacemaker in place.  Device interrogation in December 2021 revealed overall normal function with AT/AF burden 1.7%.  Still following with Dr. Nevada Crane, has a scheduled visit pending for lab work and clinical assessment.  Past Medical History:  Diagnosis Date  . Anemia   . Anxiety   . Atrial fibrillation   . Chronic back pain   . COPD (chronic obstructive pulmonary disease) ([place])   . Coronary atherosclerosis of native coronary artery    DES x 2 to RCA 10/10  . 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 Essential hypertension   . GERD (gastroesophageal reflux disease)   . H/O hiatal hernia   . Headache(784.0)   . HOH (hard of hearing)   . Hyperlipidemia   . Neuromuscular disorder ([place])    Tremors  . Pericardial effusion    Hemorrhagic   . Presence of permanent cardiac pacemaker   . Tachycardia-bradycardia syndrome ([place])    s/p Medtronic Adapta L dual chamber device  5/10    Past Surgical History:  Procedure Laterality Date  . APPENDECTOMY    . CARDIAC CATHETERIZATION  2010   stents x2.  Marland Kitchen CATARACT EXTRACTION    . CHOLECYSTECTOMY    . COLONOSCOPY  2011  . COLONOSCOPY N/A 10/19/2014   Procedure: COLONOSCOPY;  Surgeon: Rogene Houston, MD;  [facility];  Service: Endoscopy;  Laterality: N/A;  1030  . ESOPHAGEAL DILATION N/A 05/02/2020    Procedure: ESOPHAGEAL DILATION;  Surgeon: Rogene Houston, MD;  [facility];  Service: Gastroenterology;  Laterality: N/A;  . ESOPHAGOGASTRODUODENOSCOPY N/A 10/26/2014   Procedure: ESOPHAGOGASTRODUODENOSCOPY (EGD);  Surgeon: Rogene Houston, MD;  [facility];  Service: Endoscopy;  Laterality: N/A;  230 - Dr. has lunch and learn  . ESOPHAGOGASTRODUODENOSCOPY (EGD) WITH PROPOFOL N/A 05/02/2020   Procedure: ESOPHAGOGASTRODUODENOSCOPY (EGD) WITH PROPOFOL;  Surgeon: Rogene Houston, MD;  [facility];  Service: Gastroenterology;  Laterality: N/A;  250  . Esophagogastroduodenoscopy with esophageal dilation  2004, 2006, 2007  . GIVENS CAPSULE STUDY N/A 10/31/2014   Procedure: GIVENS CAPSULE STUDY;  Surgeon: Rogene Houston, MD;  [facility];  Service: Endoscopy;  Laterality: N/A;  730 -- pacemaker--needs monitoring--outpatient bed  . INSERT / REPLACE / REMOVE PACEMAKER  2010  . LEFT HEART CATHETERIZATION WITH CORONARY ANGIOGRAM N/A 11/17/2011   Procedure: LEFT HEART CATHETERIZATION WITH CORONARY ANGIOGRAM;  Surgeon: Sherren Mocha, MD;  Location: [facility] CATH LAB;  Service: Cardiovascular;  Laterality: N/A;  . LEFT HEART CATHETERIZATION WITH CORONARY ANGIOGRAM N/A 09/26/2014   Procedure: LEFT HEART CATHETERIZATION WITH CORONARY ANGIOGRAM;  Surgeon: Leonie Man, MD;  Location: [facility] CATH LAB;  Service: Cardiovascular;  Laterality: N/A;  . Right rotator cuff repair    . SHOULDER ACROMIOPLASTY Right 05/30/2015   Procedure: RIGHT SHOULDER  ACROMIOPLASTY;  Surgeon: Carole Civil, MD;  Location: [facility] OR[facility];  Service: Orthopedics;  Laterality: Right;  . SHOULDER OPEN ROTATOR CUFF REPAIR Right 05/30/2015   Procedure: OPEN ROTATOR CUFF REPAIR RIGHT SHOULDER;  Surgeon: Carole Civil, MD;  Location: [facility];  Service: Orthopedics;  Laterality: Right;  . SHOULDER OPEN ROTATOR CUFF REPAIR Left 10/22/2016   Procedure: ROTATOR CUFF REPAIR SHOULDER OPEN;  Surgeon: Carole Civil, MD;  Location: [facility];  Service: Orthopedics;  Laterality: Left;  . Subxiphoid pericardial window  11/10  . VAGINAL HYSTERECTOMY      Current Outpatient Medications  Medication Sig Dispense Refill  . amiodarone (PACERONE) 200 MG tablet Take 200 mg Daily Monday - Saturday None on Sunday 90 tablet 3  . aspirin EC 81 MG tablet Take 1 tablet (81 mg total) by mouth daily. Swallow whole. 90 tablet 3  . clobetasol cream (TEMOVATE) [id number] % Apply 1 application topically 2 (two) times daily. 30 g 0  . Cyanocobalamin (VITAMIN B-12) 5000 MCG SUBL Take 5,000 mcg by mouth daily.    . diclofenac Sodium (VOLTAREN) 1 % GEL Apply 2-4 g topically every 6 (six) hours as needed (leg/knee pain.).     Marland Kitchen folic acid (FOLVITE) [id number] MCG tablet Take 400 mcg by mouth daily.    . furosemide (LASIX) 40 MG tablet Take 40 mg by mouth See admin instructions. Take 1 tablet (40 mg) by mouth (scheduled) in the morning, may take an additional dose if needed for swelling.    . irbesartan (AVAPRO) 150 MG tablet TAKE (1) TABLET BY MOUTH ONCE DAILY. (Patient taking differently: Take 150 mg by mouth daily at 2 PM.) 90 tablet 3  . LORazepam (ATIVAN) 0.5 MG tablet Take 0.5 mg by mouth 2 (two) times daily.     . metoprolol succinate (TOPROL-XL) 25 MG 24 hr tablet Take 3 tablets (75 mg total) by mouth in the morning. & 50 mg in the evening 450 tablet 3  . Multiple Vitamin (MULTIVITAMIN WITH MINERALS) TABS tablet Take 1 tablet by mouth daily.     . nitroGLYCERIN (NITROSTAT) 0.4 MG SL tablet Place 0.4 mg under the tongue every 5 (five) minutes x 3 doses as needed for chest pain.     Marland Kitchen omeprazole (PRILOSEC) 40 MG capsule Take 1 capsule (40 mg total) by mouth daily before breakfast. 30 capsule 5  . ondansetron (ZOFRAN) 4 MG tablet Take 4 mg by mouth every 6 (six) hours as needed for nausea or vomiting.     . potassium chloride SA (K-DUR,KLOR-CON) 20 MEQ tablet Take 20 mEq by mouth See admin instructions. Takes 1 tablet (20 meq) by mouth  daily (scheduled) & a 2nd tablet only if has to take a 2nd dose of Lasix    . primidone (MYSOLINE) 50 MG tablet Take 50 mg by mouth at bedtime.    Marland Kitchen PROAIR HFA 108 (90 Base) MCG/ACT inhaler Inhale 1 puff into the lungs every 4 (four) hours as needed for wheezing or shortness of breath.     . rosuvastatin (CRESTOR) 20 MG tablet Take 20 mg by mouth at bedtime.     . triamcinolone (KENALOG) 0.025 % cream SMARTSIG:Sparingly Topical Twice Daily     No current facility-administered medications for this visit.   Allergies:  Amitriptyline hcl and Sulfonamide derivatives   ROS: Chronic hearing loss.  Physical Exam: VS:  BP (!) 148/82   Pulse (!) 55   Ht 5' 5.5" (1.664 m)   Wt 146 lb (66.2  kg)   SpO2 97%   BMI 23.93 kg/m , BMI Body mass index is 23.93 kg/m.  Wt Readings from Last 3 Encounters:  09/03/20 146 lb (66.2 kg)  08/07/20 150 lb (68 kg)  07/06/20 147 lb (66.7 kg)    General: Elderly woman, appears comfortable at rest. HEENT: Conjunctiva and lids normal, wearing a mask. Neck: Supple, no elevated JVP or carotid bruits, no thyromegaly. Lungs: Clear to auscultation, nonlabored breathing at rest. Cardiac: Regular rate and rhythm, no S3, 2/6 systolic murmur, no pericardial rub. Extremities: No pitting edema.  ECG:  An ECG dated 05/01/2020 was personally reviewed today and demonstrated:  Possible atrial paced rhythm with tremor artifact.  Recent Labwork: 05/01/2020: BUN 8; Creatinine, Ser 0.52; Hemoglobin 13.1; Platelets 211; Potassium 4.0; Sodium 140  June 2021: AST 23, ALT 18, cholesterol 268, triglycerides 161, HDL 54, LDL 184  Other Studies Reviewed Today:  Echocardiogram 06/01/2017: Study Conclusions  - Left ventricle: The cavity size was normal. Wall thickness was normal. Systolic function was vigorous. The estimated ejection fraction was in the range of 65% to 70%. The study is not technically sufficient to allow evaluation of LV diastolic function. - Aortic  valve: There was mild regurgitation. - Mitral valve: Calcified annulus. Mildly thickened leaflets . There was mild regurgitation. - Left atrium: The atrium was moderately dilated. - Right atrium: The atrium was mildly dilated.  Assessment and Plan:  1.  Paroxysmal to persistent atrial fibrillation.  Her CHA2DS2-VASc score is 5, but not anticoagulated based on previous discussions and patient preference.  She has a very low rhythm burden based on device interrogation.  Continue aspirin, amiodarone, and Toprol-XL.  2.  CAD status post DES to the RCA in 2010.  No progressive angina symptoms on medical therapy.  Continue aspirin, Toprol-XL, Avapro, and Crestor.  She has as needed nitroglycerin available.  3.  Mixed hyperlipidemia, last LDL 184 in June 2021.  Reports compliance with Crestor.  Keep scheduled follow-up lab work with Dr. Nevada Crane.  4.  Sinus node dysfunction with Medtronic pacemaker in place.  She continues to follow with Dr. Lovena Le.  Medication Adjustments/Labs and Tests Ordered: Current medicines are reviewed at length with the patient today.  Concerns regarding medicines are outlined above.   Tests Ordered: No orders of the defined types were placed in this encounter.   Medication Changes: No orders of the defined types were placed in this encounter.   Disposition:  Follow up 6 months in the [place] office.  Signed, Satira Sark, MD, [facility] 09/03/2020 1:57 PM    [facility] at [facility] [address] [place], [place] [address] Phone: [phone]; Fax: [phone]

## 2020-09-07 DIAGNOSIS — Z950 Presence of cardiac pacemaker: Secondary | ICD-10-CM | POA: Diagnosis not present

## 2020-09-07 DIAGNOSIS — I10 Essential (primary) hypertension: Secondary | ICD-10-CM | POA: Diagnosis not present

## 2020-09-07 DIAGNOSIS — R1312 Dysphagia, oropharyngeal phase: Secondary | ICD-10-CM | POA: Diagnosis not present

## 2020-09-07 DIAGNOSIS — E7849 Other hyperlipidemia: Secondary | ICD-10-CM | POA: Diagnosis not present

## 2020-09-07 DIAGNOSIS — R0789 Other chest pain: Secondary | ICD-10-CM | POA: Diagnosis not present

## 2020-09-07 DIAGNOSIS — R0602 Shortness of breath: Secondary | ICD-10-CM | POA: Diagnosis not present

## 2020-09-07 DIAGNOSIS — R809 Proteinuria, unspecified: Secondary | ICD-10-CM | POA: Diagnosis not present

## 2020-09-07 DIAGNOSIS — H9190 Unspecified hearing loss, unspecified ear: Secondary | ICD-10-CM | POA: Diagnosis not present

## 2020-09-07 DIAGNOSIS — R251 Tremor, unspecified: Secondary | ICD-10-CM | POA: Diagnosis not present

## 2020-09-07 DIAGNOSIS — J9611 Chronic respiratory failure with hypoxia: Secondary | ICD-10-CM | POA: Diagnosis not present

## 2020-09-07 DIAGNOSIS — E782 Mixed hyperlipidemia: Secondary | ICD-10-CM | POA: Diagnosis not present

## 2020-09-07 DIAGNOSIS — R7301 Impaired fasting glucose: Secondary | ICD-10-CM | POA: Diagnosis not present

## 2020-09-07 DIAGNOSIS — D509 Iron deficiency anemia, unspecified: Secondary | ICD-10-CM | POA: Diagnosis not present

## 2020-09-07 DIAGNOSIS — I480 Paroxysmal atrial fibrillation: Secondary | ICD-10-CM | POA: Diagnosis not present

## 2020-09-07 DIAGNOSIS — M75102 Unspecified rotator cuff tear or rupture of left shoulder, not specified as traumatic: Secondary | ICD-10-CM | POA: Diagnosis not present

## 2020-09-29 DIAGNOSIS — I2510 Atherosclerotic heart disease of native coronary artery without angina pectoris: Secondary | ICD-10-CM | POA: Diagnosis not present

## 2020-09-29 DIAGNOSIS — R0602 Shortness of breath: Secondary | ICD-10-CM | POA: Diagnosis not present

## 2020-10-02 ENCOUNTER — Other Ambulatory Visit: Payer: Self-pay | Admitting: *Deleted

## 2020-10-02 DIAGNOSIS — I10 Essential (primary) hypertension: Secondary | ICD-10-CM

## 2020-10-02 NOTE — Patient Outreach (Signed)
[facility]) Care Management  [facility] Care Manager  10/02/2020   [name] 04-22-1936 [id number]  Subjective: Successful telephone outreach call to patient. HIPAA identifiers obtained. Patient states that she has been feeling better. She reports being less SOB, her B/P, pulse, and weight have been better controlled. Patient reports her weight remains consistent without concerning weight gains. She has not had to take an additional lasix recently and her weight was 143 pounds this morning. Patient reports her B/P has decrease some with ranges of 130-160 systolic and 70-80's diastolic. Patient states that [employer] will stop delivering to her within the next month. Nurse will place a community resource guide referral to inquire if the meals can continue until the Meals On Wheels start. The patient lives alone and has difficulty cooking due to severe tremors. Patient stated that she needs a new shower chair with a back for safety. She explains that her old one has worn out and she needs this equipment to be safe while showering. Patient reports that she fell a few weeks ago in the bathroom. She states that her ribs were sore and have gotten better. Nurse called PCP's office and spoke to Ronnald Collum who states she will order a shower chair with a back through [employer]. Patient states she is using her cane to steady her gait while ambulating and that her nieces and friends are supportive with helping with transportation, running errands, and going to the grocery store. Patient did not have any further questions or concerns today and did confirm that she has this nurse's contact number to call her if needed.    Encounter Medications:  Outpatient Encounter Medications as of 10/02/2020  Medication Sig Note  . amiodarone (PACERONE) 200 MG tablet Take 200 mg Daily Monday - Saturday None on Sunday   . aspirin EC 81 MG tablet Take 1 tablet (81 mg total) by mouth daily. Swallow whole.   .  clobetasol cream (TEMOVATE) 0.05 % Apply 1 application topically 2 (two) times daily.   . Cyanocobalamin (VITAMIN B-12) 5000 MCG SUBL Take 5,000 mcg by mouth daily.   . diclofenac Sodium (VOLTAREN) 1 % GEL Apply 2-4 g topically every 6 (six) hours as needed (leg/knee pain.).    Marland Kitchen folic acid (FOLVITE) 400 MCG tablet Take 400 mcg by mouth daily.   . furosemide (LASIX) 40 MG tablet Take 40 mg by mouth See admin instructions. Take 1 tablet (40 mg) by mouth (scheduled) in the morning, may take an additional dose if needed for swelling.   . irbesartan (AVAPRO) 150 MG tablet TAKE (1) TABLET BY MOUTH ONCE DAILY. (Patient taking differently: Take 150 mg by mouth daily at 2 PM.)   . LORazepam (ATIVAN) 0.5 MG tablet Take 0.5 mg by mouth 2 (two) times daily.    . metoprolol succinate (TOPROL-XL) 25 MG 24 hr tablet Take 3 tablets (75 mg total) by mouth in the morning. & 50 mg in the evening   . Multiple Vitamin (MULTIVITAMIN WITH MINERALS) TABS tablet Take 1 tablet by mouth daily.    . nitroGLYCERIN (NITROSTAT) 0.4 MG SL tablet Place 0.4 mg under the tongue every 5 (five) minutes x 3 doses as needed for chest pain.  09/25/2014: .  . omeprazole (PRILOSEC) 40 MG capsule Take 1 capsule (40 mg total) by mouth daily before breakfast.   . ondansetron (ZOFRAN) 4 MG tablet Take 4 mg by mouth every 6 (six) hours as needed for nausea or vomiting.    Marland Kitchen  potassium chloride SA (K-DUR,KLOR-CON) 20 MEQ tablet Take 20 mEq by mouth See admin instructions. Takes 1 tablet (20 meq) by mouth daily (scheduled) & a 2nd tablet only if has to take a 2nd dose of Lasix   . primidone (MYSOLINE) 50 MG tablet Take 50 mg by mouth at bedtime.   Marland Kitchen PROAIR HFA 108 (90 Base) MCG/ACT inhaler Inhale 1 puff into the lungs every 4 (four) hours as needed for wheezing or shortness of breath.    . rosuvastatin (CRESTOR) 20 MG tablet Take 20 mg by mouth at bedtime.    . triamcinolone (KENALOG) 0.025 % cream SMARTSIG:Sparingly Topical Twice Daily    No  facility-administered encounter medications on file as of 10/02/2020.    Functional Status:  In your present state of health, do you have any difficulty performing the following activities: 05/01/2020  Hearing? N  Vision? N  Difficulty concentrating or making decisions? N  Walking or climbing stairs? Y  Dressing or bathing? N  Doing errands, shopping? N  Some recent data might be hidden    Fall/Depression Screening: Fall Risk  10/02/2020 06/11/2020 06/11/2020  Falls in the past year? 1 1 1   Number falls in past yr: 1 0 0  Injury with Fall? 0 0 0  Risk for fall due to : Impaired mobility;Impaired balance/gait;History of fall(s) History of fall(s);Impaired balance/gait;Impaired mobility History of fall(s);Impaired mobility;Impaired balance/gait  Follow up Falls prevention discussed;Education provided;Falls evaluation completed Falls prevention discussed;Education provided;Falls evaluation completed Falls prevention discussed;Education provided;Falls evaluation completed   PHQ 2/9 Scores 05/07/2020 04/05/2020 12/25/2015  PHQ - 2 Score 0 1 0  Exception Documentation - (No Data) -    Assessment:  Goals Addressed            This Visit's Progress   . [facility]) Patient will make an eye appointment within the next 90 days.       Timeframe:  Long-Range Goal Priority:  Medium Start Date: 10/02/20                            Expected End Date: 01/31/21                      Follow Up Date 01/31/21    - ask family or friend for a ride - call to cancel if needed  Encouraged patient to call [facility] to make an eye appointment  Why is this important?    Part of staying healthy is seeing the doctor for follow-up care.   If you forget your appointments, there are some things you can do to stay on track.    Notes: Discussed importance of annual eye exams and eye health. Per patient's request nurse provided her with the phone number to [facility]. [phone]. Patient states she will call to make an  appointment.     Marland Kitchen [facility]) Remove Barriers to Better Nutrition       Timeframe:  Long-Range Goal Priority:  High Start Date: 07/18/20                            Expected End Date: 07/18/21                     Follow Up Date 01/31/21   - arrange meal delivery service like Meals on Wheels - ask for help with cooking and shopping - make food easier to swallow  by adding gravy or drinking water between bites - take my supplements as prescribed    Why is this important?    Eating healthy is not always easy.   You may not feel hungry or food might not taste good.   It may be a lot of work to shop or make meals.   There are things you can do to make it easier.    Notes: Patient states she forgot to order her Mom's meals on 07/10/20 and she is not receiving any delivery. Nurse will inform SW about the Mom's meals issue and see if she can call to restart food delivery. Patient is followed by [name] for her swallowing difficulties. Encouraged patient to eat soft foods that are easy to swallow and to drink water in between bites. Patient supplements with Ensure and nurse will send her Ensure coupons.   Updated 10/02/20: Patient reports that she is swallowing better. She states that MOM's meals will run out within the next month. Nurse will contact community resource guide to inquire if  Mom's meals can continue while the patient waits to receive Meals On Wheels. Nurse will send patient Ensure coupons.    Marland Kitchen [facility]) Track and Manage My Blood Pressure       Timeframe:  Long-Range Goal Priority:  High Start Date: 05/07/20                            Expected End Date: 05/07/21                      Follow Up Date 01/31/21   - check blood pressure daily - write blood pressure results in a log or diary    Why is this important?   You won't feel high blood pressure, but it can still hurt your blood vessels.  High blood pressure can cause heart or kidney problems. It can also cause a stroke.  Making  lifestyle changes like losing a little weight or eating less salt will help.  Checking your blood pressure at home and at different times of the day can help to control blood pressure.  If the doctor prescribes medicine remember to take it the way the doctor ordered.  Call the office if you cannot afford the medicine or if there are questions about it.     Notes: Patient received the scale, B/P cuff, Hypertension and CHF education. Patient states she is taking her weight and B/P daily but is unable to write down her daily values due to severe tremors. Nurse reviewed signs and symptoms action plans and zones. Updated: 07/18/20  Updated 10/02/20: Patient reports continuation of taking her weight, pulse and B/P daily. She reports that her SOB has improved and her B/P and weight remain stable. Nurse reviewed signs and symptoms, action plans, and zones.    Marland Kitchen [facility]) Track and Manage Symptoms       Timeframe:  Long-Range Goal Priority:  High Start Date: 05/07/20                            Expected End Date: 05/07/21                    Follow Up Date 01/31/21   - eat more whole grains, fruits and vegetables, lean meats and healthy fats - follow rescue plan if symptoms flare-up - know when to call  the doctor - track symptoms and what helps feel better or worse    Why is this important?   You will be able to handle your symptoms better if you keep track of them.  Making some simple changes to your lifestyle will help.  Eating healthy is one thing you can do to take good care of yourself.    Notes: Patient received the scale, B/P cuff, Hypertension and CHF education. Patient states she is taking her weight and B/P daily but is unable to write down her daily values due to severe tremors. Nurse reviewed signs and symptoms, action plans and zones. Updated: 07/18/20  Updated 10/02/20: Patient reports continuation of taking her weight, pulse and B/P daily. She reports that her SOB has improved and her B/P and  weight remain stable. Nurse reviewed signs and symptoms, action plans, and zones.      Plan: RN Health Coach will send PCP a quarterly update, will send patient Ensure coupons, will place community resource referral for food insecurity, and will call patient within the month of May. Follow-up:  Patient agrees to Care Plan and Follow-up.    Blanchie Serve RN, BSN [facility] Care Management  RN Health Coach [phone] [email or web address]

## 2020-10-02 NOTE — Patient Instructions (Addendum)
Goals Addressed            This Visit's Progress   . [facility]) Patient will make an eye appointment within the next 90 days.       Timeframe:  Long-Range Goal Priority:  Medium Start Date: 10/02/20                            Expected End Date: 01/31/21                      Follow Up Date 01/31/21    - ask family or friend for a ride - call to cancel if needed  Encouraged patient to call [facility] to make an eye appointment  Why is this important?    Part of staying healthy is seeing the doctor for follow-up care.   If you forget your appointments, there are some things you can do to stay on track.    Notes: Discussed importance of annual eye exams and eye health. Per patient's request nurse provided her with the phone number to [facility]. [phone]. Patient states she will call to make an appointment.     Marland Kitchen [facility]) Remove Barriers to Better Nutrition       Timeframe:  Long-Range Goal Priority:  High Start Date: 07/18/20                            Expected End Date: 07/18/21                     Follow Up Date 01/31/21   - arrange meal delivery service like Meals on Wheels - ask for help with cooking and shopping - make food easier to swallow by adding gravy or drinking water between bites - take my supplements as prescribed    Why is this important?    Eating healthy is not always easy.   You may not feel hungry or food might not taste good.   It may be a lot of work to shop or make meals.   There are things you can do to make it easier.    Notes: Patient states she forgot to order her Mom's meals on 07/10/20 and she is not receiving any delivery. Nurse will inform SW about the Mom's meals issue and see if she can call to restart food delivery. Patient is followed by Dr. Laural Golden for her swallowing difficulties. Encouraged patient to eat soft foods that are easy to swallow and to drink water in between bites. Patient supplements with Ensure and nurse will send her Ensure coupons.    Updated 10/02/20: Patient reports that she is swallowing better. She states that MOM's meals will run out within the next month. Nurse will contact community resource guide to inquire if  Mom's meals can continue while the patient waits to receive Meals On Wheels. Nurse will send patient Ensure coupons.    Marland Kitchen [facility]) Track and Manage My Blood Pressure       Timeframe:  Long-Range Goal Priority:  High Start Date: 05/07/20                            Expected End Date: 05/07/21                      Follow Up Date 01/31/21   - check  blood pressure daily - write blood pressure results in a log or diary    Why is this important?   You won't feel high blood pressure, but it can still hurt your blood vessels.  High blood pressure can cause heart or kidney problems. It can also cause a stroke.  Making lifestyle changes like losing a little weight or eating less salt will help.  Checking your blood pressure at home and at different times of the day can help to control blood pressure.  If the doctor prescribes medicine remember to take it the way the doctor ordered.  Call the office if you cannot afford the medicine or if there are questions about it.     Notes: Patient received the scale, B/P cuff, Hypertension and CHF education. Patient states she is taking her weight and B/P daily but is unable to write down her daily values due to severe tremors. Nurse reviewed signs and symptoms action plans and zones. Updated: 07/18/20  Updated 10/02/20: Patient reports continuation of taking her weight, pulse and B/P daily. She reports that her SOB has improved and her B/P and weight remain stable. Nurse reviewed signs and symptoms, action plans, and zones.    Marland Kitchen [facility]) Track and Manage Symptoms       Timeframe:  Long-Range Goal Priority:  High Start Date: 05/07/20                            Expected End Date: 05/07/21                    Follow Up Date 01/31/21   - eat more whole grains, fruits and vegetables,  lean meats and healthy fats - follow rescue plan if symptoms flare-up - know when to call the doctor - track symptoms and what helps feel better or worse    Why is this important?   You will be able to handle your symptoms better if you keep track of them.  Making some simple changes to your lifestyle will help.  Eating healthy is one thing you can do to take good care of yourself.    Notes: Patient received the scale, B/P cuff, Hypertension and CHF education. Patient states she is taking her weight and B/P daily but is unable to write down her daily values due to severe tremors. Nurse reviewed signs and symptoms, action plans and zones. Updated: 07/18/20  Updated 10/02/20: Patient reports continuation of taking her weight, pulse and B/P daily. She reports that her SOB has improved and her B/P and weight remain stable. Nurse reviewed signs and symptoms, action plans, and zones.

## 2020-10-04 ENCOUNTER — Other Ambulatory Visit: Payer: Self-pay | Admitting: *Deleted

## 2020-10-04 ENCOUNTER — Telehealth: Payer: Self-pay | Admitting: Cardiology

## 2020-10-04 ENCOUNTER — Telehealth: Payer: Self-pay

## 2020-10-04 MED ORDER — ROSUVASTATIN CALCIUM 20 MG PO TABS
20.0000 mg | ORAL_TABLET | Freq: Every day | ORAL | 3 refills | Status: DC
Start: 2020-10-04 — End: 2021-03-22

## 2020-10-04 MED ORDER — IRBESARTAN 150 MG PO TABS: ORAL_TABLET | ORAL | 3 refills | Status: DC

## 2020-10-04 NOTE — Telephone Encounter (Signed)
*  STAT* If patient is at the pharmacy, call can be transferred to refill team.   1. Which medications need to be refilled? (please list name of each medication and dose if known) irbesartan (AVAPRO) 150 MG tablet  * Jill at [facility] would like to have any medications that are currently due filled for this patient. She said the patient gets confused and upset but she knows she only has a couple days left on the Irbesartan.  2. Which pharmacy/location (including street and city if local pharmacy) is medication to be sent to? [place] in [place]  3. Do they need a 30 day or 90 day supply? [place]

## 2020-10-04 NOTE — Telephone Encounter (Signed)
New message    Please add irbesartan (AVAPRO) 150 MG tablet rosuvastatin (CRESTOR) 20 MG tablet to the refills going to [facility] in [place] in 90 day supplys

## 2020-10-04 NOTE — Telephone Encounter (Signed)
Telephone encounter was:  Successful.  10/04/2020 Name: [name] MRN: [id number] DOB: 1936/04/29  [name] is a 85 y.o. year old female who is a primary care patient of Margo Aye, Kathleene Hazel, MD . The community resource team was consulted for assistance with Food Insecurity  Care guide performed the following interventions: Patient provided with information about care guide support team and interviewed to confirm resource needs Investigation of community resources performed 10/04/20 Per email response from Franklin Nettles This patients' meals are still active until the end of April. Patient's meals will be extended starting in May.   Spoke with Merlyn Albert at [facility] Ms. [place] is 102 on the waitlist and it will be at least another six months..  Follow Up Plan:  No further follow up planned at this time. The patient has been provided with needed resources. and let patient know her meals would be delivered through the end of April and would be extended starting in May.  Lamonica Trueba, AAS Paralegal, [facility] Care Guide . Embedded Care Coordination [facility]  Care Management  [address] [place], [place] [address] [email or web address]  ?? [phone]   [email or web address]

## 2020-10-04 NOTE — Patient Outreach (Signed)
[facility]) Care Management  10/04/2020  [name] November 21, 1935 [id number]  Successful telephone outreach call to patient. HIPAA identifiers obtained. Nurse called patient back in response to a message she received to call her back. Patient reports that she has just a few irbesartan pills left and she also needs a refill for her prescription rosuvastatin. Nurse called Dr. Myles Gip office to inform them of this situation. Nurse spoke to Amy at Dr. Myles Gip office first and next spoke to Gene due to not knowing about the rosuvastatin medication until a few minutes after she made the first call. Gene stated that this information would be given to Dr. Myles Gip nurse. Nurse discussed with the patient that she did contact her cardiologist to request that these prescriptions be sent to [facility]. Patient did not have any further questions or concerns today and did confirm that the patient has this nurse's contact number to call her if needed.   Plan: RN Health Coach will call patient within the month of  May.  Katrina Loron RN, BSN [place] [phone] [email or web address]

## 2020-10-04 NOTE — Telephone Encounter (Signed)
Medication refill request approved.

## 2020-10-27 DIAGNOSIS — R0602 Shortness of breath: Secondary | ICD-10-CM | POA: Diagnosis not present

## 2020-10-27 DIAGNOSIS — I2510 Atherosclerotic heart disease of native coronary artery without angina pectoris: Secondary | ICD-10-CM | POA: Diagnosis not present

## 2020-10-31 DIAGNOSIS — I480 Paroxysmal atrial fibrillation: Secondary | ICD-10-CM | POA: Diagnosis not present

## 2020-10-31 DIAGNOSIS — M545 Low back pain, unspecified: Secondary | ICD-10-CM | POA: Diagnosis not present

## 2020-10-31 DIAGNOSIS — R1312 Dysphagia, oropharyngeal phase: Secondary | ICD-10-CM | POA: Diagnosis not present

## 2020-10-31 DIAGNOSIS — E782 Mixed hyperlipidemia: Secondary | ICD-10-CM | POA: Diagnosis not present

## 2020-10-31 DIAGNOSIS — H9190 Unspecified hearing loss, unspecified ear: Secondary | ICD-10-CM | POA: Diagnosis not present

## 2020-10-31 DIAGNOSIS — I10 Essential (primary) hypertension: Secondary | ICD-10-CM | POA: Diagnosis not present

## 2020-10-31 DIAGNOSIS — R7301 Impaired fasting glucose: Secondary | ICD-10-CM | POA: Diagnosis not present

## 2020-10-31 DIAGNOSIS — R0602 Shortness of breath: Secondary | ICD-10-CM | POA: Diagnosis not present

## 2020-10-31 DIAGNOSIS — M75102 Unspecified rotator cuff tear or rupture of left shoulder, not specified as traumatic: Secondary | ICD-10-CM | POA: Diagnosis not present

## 2020-11-27 DIAGNOSIS — R0602 Shortness of breath: Secondary | ICD-10-CM | POA: Diagnosis not present

## 2020-11-27 DIAGNOSIS — I2510 Atherosclerotic heart disease of native coronary artery without angina pectoris: Secondary | ICD-10-CM | POA: Diagnosis not present

## 2020-12-06 ENCOUNTER — Ambulatory Visit (INDEPENDENT_AMBULATORY_CARE_PROVIDER_SITE_OTHER): Payer: Medicare Other | Admitting: Emergency Medicine

## 2020-12-06 ENCOUNTER — Telehealth: Payer: Self-pay

## 2020-12-06 ENCOUNTER — Other Ambulatory Visit: Payer: Self-pay

## 2020-12-06 DIAGNOSIS — I495 Sick sinus syndrome: Secondary | ICD-10-CM

## 2020-12-06 LAB — CUP PACEART INCLINIC DEVICE CHECK
Battery Impedance: 2280 Ohm
Battery Remaining Longevity: 30 mo
Battery Voltage: 2.73 V
Brady Statistic AP VP Percent: 9 %
Brady Statistic AP VS Percent: 72 %
Brady Statistic AS VP Percent: 0 %
Brady Statistic AS VS Percent: 20 %
Date Time Interrogation Session: 20220505105855
Implantable Lead Implant Date: 20100526
Implantable Lead Implant Date: 20100526
Implantable Lead Location: 753859
Implantable Lead Location: 753860
Implantable Lead Model: 5076
Implantable Lead Model: 5076
Implantable Pulse Generator Implant Date: 20100526
Lead Channel Impedance Value: 448 Ohm
Lead Channel Impedance Value: 488 Ohm
Lead Channel Pacing Threshold Amplitude: 0.75 V
Lead Channel Pacing Threshold Amplitude: 0.75 V
Lead Channel Pacing Threshold Pulse Width: 0.4 ms
Lead Channel Pacing Threshold Pulse Width: 0.46 ms
Lead Channel Sensing Intrinsic Amplitude: 1 mV
Lead Channel Sensing Intrinsic Amplitude: 11.2 mV
Lead Channel Setting Pacing Amplitude: 2 V
Lead Channel Setting Pacing Amplitude: 2.5 V
Lead Channel Setting Pacing Pulse Width: 0.46 ms
Lead Channel Setting Sensing Sensitivity: 2.8 mV

## 2020-12-06 NOTE — Telephone Encounter (Signed)
I have called [employer] per Leigh to order patient a new monitor as she is going to do remote checks now due to battery. Patient aware the monitor is on backorder with no ETA but most likely 3 months.

## 2020-12-06 NOTE — Progress Notes (Signed)
Pacemaker check in clinic. Normal device function. Thresholds, sensing, impedances consistent with previous measurements. Device programmed to maximize longevity. AT/AF burden <0.1%. 64 AHR's, EGM's available appear AT & AF. Longest in duration 41 minutes. Ventricular rates appear elevated. Patient does report generalized weakness. Device programmed at appropriate safety margins. Histogram distribution appropriate for patient activity level. Device programmed to optimize intrinsic conduction. Estimated longevity 30 months. Patient does want to remote monitor. Frances Furbish ordered remote monitor to be shipped to the house. Aware to call the device clinic when she receives it to help with setting up.  Patient education completed.

## 2020-12-07 ENCOUNTER — Other Ambulatory Visit: Payer: Self-pay | Admitting: *Deleted

## 2020-12-07 NOTE — Patient Instructions (Addendum)
Goals Addressed            This Visit's Progress   . COMPLET[facility]: [facility]) Patient will make an eye appointment within the next 90 days.       Timeframe:  Long-Range Goal Priority:  Medium Start Date: 10/02/20                            Expected End Date: 12/07/20                     Follow Up Date 12/07/20    - ask family or friend for a ride - call to cancel if needed  Encouraged patient to call [facility] to make an eye appointment  Why is this important?    Part of staying healthy is seeing the doctor for follow-up care.   If you forget your appointments, there are some things you can do to stay on track.    Notes: Discussed importance of annual eye exams and eye health. Per patient's request nurse provided her with the phone number to [facility]. [phone]. Patient states she will call to make an appointment.   Updated 12/07/20: Patient states she has an eye exam appointment scheduled for 03/19/21.    Marland Kitchen [facility]) Remove Barriers to Better Nutrition       Timeframe:  Long-Range Goal Priority:  High Start Date: 07/18/20                            Expected End Date: 07/18/21                     Follow Up Date 03/02/21   - arrange meal delivery service like Meals on Wheels - ask for help with cooking and shopping - make food easier to swallow by adding gravy or drinking water between bites - take my supplements as prescribed  -Encouraged patient to continue to drink Ensure 2-3 times daily   Why is this important?    Eating healthy is not always easy.   You may not feel hungry or food might not taste good.   It may be a lot of work to shop or make meals.   There are things you can do to make it easier.    Notes: Patient states she forgot to order her Mom's meals on 07/10/20 and she is not receiving any delivery. Nurse will inform SW about the Mom's meals issue and see if she can call to restart food delivery. Patient is followed by Dr. Laural Golden for her swallowing difficulties. Encouraged  patient to eat soft foods that are easy to swallow and to drink water in between bites. Patient supplements with Ensure and nurse will send her Ensure coupons.   Updated 10/02/20: Patient reports that she is swallowing better. She states that MOM's meals will run out within the next month. Nurse will contact community resource guide to inquire if  Mom's meals can continue while the patient waits to receive Meals On Wheels. Nurse will send patient Ensure coupons.  Updated 12/07/20: Patient states that MOM's meals will continue until Meals on Wheels begin which may be another 6 month wait time. Patient continues to supplement with Ensure 2-3 times daily. Nurse will send patient Ensure coupons.     Marland Kitchen [facility]) Track and Manage My Blood Pressure       Timeframe:  Long-Range Goal Priority:  High  Start Date: 05/07/20                            Expected End Date: 05/07/21                      Follow Up Date 03/02/21   - check blood pressure daily - write blood pressure results in a log or diary  Encouraged patient to limit her salt intake. -Discussed for patient to review color zones and rescue action plans located in her [facility] calendar booklet   Why is this important?   You won't feel high blood pressure, but it can still hurt your blood vessels.  High blood pressure can cause heart or kidney problems. It can also cause a stroke.  Making lifestyle changes like losing a little weight or eating less salt will help.  Checking your blood pressure at home and at different times of the day can help to control blood pressure.  If the doctor prescribes medicine remember to take it the way the doctor ordered.  Call the office if you cannot afford the medicine or if there are questions about it.     Notes: Patient received the scale, B/P cuff, Hypertension and CHF education. Patient states she is taking her weight and B/P daily but is unable to write down her daily values due to severe tremors. Nurse reviewed signs  and symptoms action plans and zones. Updated: 07/18/20  Updated 10/02/20: Patient reports continuation of taking her weight, pulse and B/P daily. She reports that her SOB has improved and her B/P and weight remain stable. Nurse reviewed signs and symptoms, action plans, and zones.  Updated 12/07/20: Patient continues to take her B/P and pulse daily. She states her B/P values have improved with her systolic values typically not getting higher than 150 unless she gets upset or nervous.     Marland Kitchen [facility]) Track and Manage Symptoms       Timeframe:  Long-Range Goal Priority:  High Start Date: 05/07/20                            Expected End Date: 05/07/21                    Follow Up Date 03/02/21   - eat more whole grains, fruits and vegetables, lean meats and healthy fats - follow rescue plan if symptoms flare-up - know when to call the doctor - track symptoms and what helps feel better or worse  -Encouraged patient to limit her salt intake. -Discussed for patient to review color zones and rescue action plans located in her [facility] calendar booklet   Why is this important?   You will be able to handle your symptoms better if you keep track of them.  Making some simple changes to your lifestyle will help.  Eating healthy is one thing you can do to take good care of yourself.    Notes: Patient received the scale, B/P cuff, Hypertension and CHF education. Patient states she is taking her weight and B/P daily but is unable to write down her daily values due to severe tremors. Nurse reviewed signs and symptoms, action plans and zones. Updated: 07/18/20  Updated 10/02/20: Patient reports continuation of taking her weight, pulse and B/P daily. She reports that her SOB has improved and her B/P and weight remain stable. Nurse reviewed signs and  symptoms, action plans, and zones.  Updated 12/07/20: Patient reports that she does take her weight daily and her weight has remained stable. She is monitoring for symptoms of  CHF and nurse reviewed rescue action plans with patient.        How to Take Your Blood Pressure Blood pressure measures how strongly your blood is pressing against the walls of your arteries. Arteries are blood vessels that carry blood from your heart throughout your body. You can take your blood pressure at home with a machine. You may need to check your blood pressure at home:  To check if you have high blood pressure (hypertension).  To check your blood pressure over time.  To make sure your blood pressure medicine is working. Supplies needed:  Blood pressure machine, or monitor.  Dining room chair to sit in.  Table or desk.  Small notebook.  Pencil or pen. How to prepare Avoid these things for 30 minutes before checking your blood pressure:  Having drinks with caffeine in them, such as coffee or tea.  Drinking alcohol.  Eating.  Smoking.  Exercising. Do these things five minutes before checking your blood pressure:  Go to the bathroom and pee (urinate).  Sit in a dining chair. Do not sit in a soft couch or an armchair.  Be quiet. Do not talk. How to take your blood pressure Follow the instructions that came with your machine. If you have a digital blood pressure monitor, these may be the instructions: 1. Sit up straight. 2. Place your feet on the floor. Do not cross your ankles or legs. 3. Rest your left arm at the level of your heart. You may rest it on a table, desk, or chair. 4. Pull up your shirt sleeve. 5. Wrap the blood pressure cuff around the upper part of your left arm. The cuff should be 1 inch (2.5 cm) above your elbow. It is best to wrap the cuff around bare skin. 6. Fit the cuff snugly around your arm. You should be able to place only one finger between the cuff and your arm. 7. Place the cord so that it rests in the bend of your elbow. 8. Press the power button. 9. Sit quietly while the cuff fills with air and loses air. 10. Write down the  numbers on the screen. 11. Wait 2-3 minutes and then repeat steps 1-10.   What do the numbers mean? Two numbers make up your blood pressure. The first number is called systolic pressure. The second is called diastolic pressure. An example of a blood pressure reading is "120 over 80" (or 120/80). If you are an adult and do not have a medical condition, use this guide to find out if your blood pressure is normal: Normal  First number: below 120.  Second number: below 80. Elevated  First number: 120-129.  Second number: below 80. Hypertension stage 1  First number: 130-139.  Second number: 80-89. Hypertension stage 2  First number: 140 or above.  Second number: 1 or above. Your blood pressure is above normal even if only the top or bottom number is above normal. Follow these instructions at home:  Check your blood pressure as often as your doctor tells you to.  Check your blood pressure at the same time every day.  Take your monitor to your next doctor's appointment. Your doctor will: ? Make sure you are using it correctly. ? Make sure it is working right.  Make sure you understand what your  blood pressure numbers should be.  Tell your doctor if your medicine is causing side effects.  Keep all follow-up visits as told by your doctor. This is important. General tips:  You will need a blood pressure machine, or monitor. Your doctor can suggest a monitor. You can buy one at a drugstore or online. When choosing one: ? Choose one with an arm cuff. ? Choose one that wraps around your upper arm. Only one finger should fit between your arm and the cuff. ? Do not choose one that measures your blood pressure from your wrist or finger. Where to find more information American Heart Association: www.heart.org Contact a doctor if:  Your blood pressure keeps being high. Get help right away if:  Your first blood pressure number is higher than 180.  Your second blood pressure  number is higher than 120. Summary  Check your blood pressure at the same time every day.  Avoid caffeine, alcohol, smoking, and exercise for 30 minutes before checking your blood pressure.  Make sure you understand what your blood pressure numbers should be. This information is not intended to replace advice given to you by your health care provider. Make sure you discuss any questions you have with your health care provider. Document Revised: 07/15/2019 Document Reviewed: 07/15/2019 Elsevier Patient Education  2021 [employer].

## 2020-12-07 NOTE — Patient Outreach (Signed)
[facility]) Care Management  [facility] Care Manager  12/07/2020   [name] 03/08/1936 [id number]  Subjective: Successful telephone outreach call to patient. HIPAA identifiers obtained. Patient reports that her B/P values are better. She continues to take her B/P daily and explains that typically her systolic ranges are 130-140's and her diastolic ranges are 70-80's. Patient does say that her values will elevate if she becomes anxious or upset but this has improved as well. Patient will continue to receive MOM's meals until [employer] Whe[employer] starts to deliver to her which may be another 6 months. She continues to supplement with Ensure and nurse will send her Ensure coupons. Nurse discussed with patient to continue to weigh daily to help control her CHF as well as to track that she is not losing weight due to her swallowing issues and nutrition. Patient reports that her SOB has improved and she does monitor her LE swelling. Patient states that her neighbor [name] has been placed as her emergency contact. She adds that [name] does transport her to her provider's appointments as well as other places she needs to go. Patient explains that her nieces [name] and [name] and her daughter [name] are also available to help support her if needed; she denies any needs for transportation assistance. Patient did not have any further questions or concerns today and did confirm that she has this nurse's contact number to call her if needed.   Encounter Medications:  Outpatient Encounter Medications as of 12/07/2020  Medication Sig Note  . amiodarone (PACERONE) 200 MG tablet Take 200 mg Daily Monday - Saturday None on Sunday   . aspirin EC 81 MG tablet Take 1 tablet (81 mg total) by mouth daily. Swallow whole.   . clobetasol cream (TEMOVATE) 0.05 % Apply 1 application topically 2 (two) times daily.   . Cyanocobalamin (VITAMIN B-12) 5000 MCG SUBL Take 5,000 mcg by mouth daily.   . diclofenac Sodium (VOLTAREN) 1  % GEL Apply 2-4 g topically every 6 (six) hours as needed (leg/knee pain.).    Marland Kitchen folic acid (FOLVITE) 400 MCG tablet Take 400 mcg by mouth daily.   . furosemide (LASIX) 40 MG tablet Take 40 mg by mouth See admin instructions. Take 1 tablet (40 mg) by mouth (scheduled) in the morning, may take an additional dose if needed for swelling.   . irbesartan (AVAPRO) 150 MG tablet TAKE (1) TABLET BY MOUTH ONCE DAILY.   Marland Kitchen LORazepam (ATIVAN) 0.5 MG tablet Take 0.5 mg by mouth 2 (two) times daily.    . metoprolol succinate (TOPROL-XL) 25 MG 24 hr tablet Take 3 tablets (75 mg total) by mouth in the morning. & 50 mg in the evening   . Multiple Vitamin (MULTIVITAMIN WITH MINERALS) TABS tablet Take 1 tablet by mouth daily.    . nitroGLYCERIN (NITROSTAT) 0.4 MG SL tablet Place 0.4 mg under the tongue every 5 (five) minutes x 3 doses as needed for chest pain.  09/25/2014: .  . omeprazole (PRILOSEC) 40 MG capsule Take 1 capsule (40 mg total) by mouth daily before breakfast.   . ondansetron (ZOFRAN) 4 MG tablet Take 4 mg by mouth every 6 (six) hours as needed for nausea or vomiting.    . potassium chloride SA (K-DUR,KLOR-CON) 20 MEQ tablet Take 20 mEq by mouth See admin instructions. Takes 1 tablet (20 meq) by mouth daily (scheduled) & a 2nd tablet only if has to take a 2nd dose of Lasix   . primidone (MYSOLINE) 50  MG tablet Take 50 mg by mouth at bedtime.   Marland Kitchen PROAIR HFA 108 (90 Base) MCG/ACT inhaler Inhale 1 puff into the lungs every 4 (four) hours as needed for wheezing or shortness of breath.    . rosuvastatin (CRESTOR) 20 MG tablet Take 1 tablet (20 mg total) by mouth at bedtime.   . triamcinolone (KENALOG) 0.025 % cream SMARTSIG:Sparingly Topical Twice Daily    No facility-administered encounter medications on file as of 12/07/2020.    Functional Status:  In your present state of health, do you have any difficulty performing the following activities: 05/01/2020  Hearing? N  Vision? N  Difficulty concentrating or  making decisions? N  Walking or climbing stairs? Y  Dressing or bathing? N  Doing errands, shopping? N  Some recent data might be hidden    Fall/Depression Screening: Fall Risk  12/07/2020 10/02/2020 06/11/2020  Falls in the past year? 1 1 1   Number falls in past yr: 1 1 0  Injury with Fall? 0 0 0  Risk for fall due to : Impaired mobility;Impaired balance/gait;History of fall(s) Impaired mobility;Impaired balance/gait;History of fall(s) History of fall(s);Impaired balance/gait;Impaired mobility  Follow up Falls prevention discussed;Education provided;Falls evaluation completed Falls prevention discussed;Education provided;Falls evaluation completed Falls prevention discussed;Education provided;Falls evaluation completed   PHQ 2/9 Scores 05/07/2020 04/05/2020 12/25/2015  PHQ - 2 Score 0 1 0  Exception Documentation - (No Data) -    Assessment:  Goals Addressed            This Visit's Progress   . COMPLETED: [facility]) Patient will make an eye appointment within the next 90 days.       Timeframe:  Long-Range Goal Priority:  Medium Start Date: 10/02/20                            Expected End Date: 12/07/20                     Follow Up Date 12/07/20    - ask family or friend for a ride - call to cancel if needed  Encouraged patient to call [facility] to make an eye appointment  Why is this important?    Part of staying healthy is seeing the doctor for follow-up care.   If you forget your appointments, there are some things you can do to stay on track.    Notes: Discussed importance of annual eye exams and eye health. Per patient's request nurse provided her with the phone number to [facility]. [phone]. Patient states she will call to make an appointment.   Updated 12/07/20: Patient states she has an eye exam appointment scheduled for 03/19/21.    Marland Kitchen [facility]) Remove Barriers to Better Nutrition       Timeframe:  Long-Range Goal Priority:  High Start Date: 07/18/20                             Expected End Date: 07/18/21                     Follow Up Date 03/02/21   - arrange meal delivery service like Meals on Wheels - ask for help with cooking and shopping - make food easier to swallow by adding gravy or drinking water between bites - take my supplements as prescribed  -Encouraged patient to continue to drink Ensure 2-3 times daily   Why  is this important?    Eating healthy is not always easy.   You may not feel hungry or food might not taste good.   It may be a lot of work to shop or make meals.   There are things you can do to make it easier.    Notes: Patient states she forgot to order her Mom's meals on 07/10/20 and she is not receiving any delivery. Nurse will inform SW about the Mom's meals issue and see if she can call to restart food delivery. Patient is followed by [name] for her swallowing difficulties. Encouraged patient to eat soft foods that are easy to swallow and to drink water in between bites. Patient supplements with Ensure and nurse will send her Ensure coupons.   Updated 10/02/20: Patient reports that she is swallowing better. She states that MOM's meals will run out within the next month. Nurse will contact community resource guide to inquire if  Mom's meals can continue while the patient waits to receive Meals On Wheels. Nurse will send patient Ensure coupons.  Updated 12/07/20: Patient states that MOM's meals will continue until Meals on Wheels begin which may be another 6 month wait time. Patient continues to supplement with Ensure 2-3 times daily. Nurse will send patient Ensure coupons.     Marland Kitchen [facility]) Track and Manage My Blood Pressure       Timeframe:  Long-Range Goal Priority:  High Start Date: 05/07/20                            Expected End Date: 05/07/21                      Follow Up Date 03/02/21   - check blood pressure daily - write blood pressure results in a log or diary  Encouraged patient to limit her salt intake. -Discussed for  patient to review color zones and rescue action plans located in her [facility] calendar booklet   Why is this important?   You won't feel high blood pressure, but it can still hurt your blood vessels.  High blood pressure can cause heart or kidney problems. It can also cause a stroke.  Making lifestyle changes like losing a little weight or eating less salt will help.  Checking your blood pressure at home and at different times of the day can help to control blood pressure.  If the doctor prescribes medicine remember to take it the way the doctor ordered.  Call the office if you cannot afford the medicine or if there are questions about it.     Notes: Patient received the scale, B/P cuff, Hypertension and CHF education. Patient states she is taking her weight and B/P daily but is unable to write down her daily values due to severe tremors. Nurse reviewed signs and symptoms action plans and zones. Updated: 07/18/20  Updated 10/02/20: Patient reports continuation of taking her weight, pulse and B/P daily. She reports that her SOB has improved and her B/P and weight remain stable. Nurse reviewed signs and symptoms, action plans, and zones.  Updated 12/07/20: Patient continues to take her B/P and pulse daily. She states her B/P values have improved with her systolic values typically not getting higher than 150 unless she gets upset or nervous.     Marland Kitchen [facility]) Track and Manage Symptoms       Timeframe:  Long-Range Goal Priority:  High Start Date: 05/07/20  Expected End Date: 05/07/21                    Follow Up Date 03/02/21   - eat more whole grains, fruits and vegetables, lean meats and healthy fats - follow rescue plan if symptoms flare-up - know when to call the doctor - track symptoms and what helps feel better or worse  -Encouraged patient to limit her salt intake. -Discussed for patient to review color zones and rescue action plans located in her [facility] calendar booklet   Why  is this important?   You will be able to handle your symptoms better if you keep track of them.  Making some simple changes to your lifestyle will help.  Eating healthy is one thing you can do to take good care of yourself.    Notes: Patient received the scale, B/P cuff, Hypertension and CHF education. Patient states she is taking her weight and B/P daily but is unable to write down her daily values due to severe tremors. Nurse reviewed signs and symptoms, action plans and zones. Updated: 07/18/20  Updated 10/02/20: Patient reports continuation of taking her weight, pulse and B/P daily. She reports that her SOB has improved and her B/P and weight remain stable. Nurse reviewed signs and symptoms, action plans, and zones.  Updated 12/07/20: Patient reports that she does take her weight daily and her weight has remained stable. She is monitoring for symptoms of CHF and nurse reviewed rescue action plans with patient.       Plan: RN Health Coach will send PCP a quarterly update, will send patient Ensure coupons, and will call patient within the month of June. Follow-up:  Patient agrees to Care Plan and Follow-up.  Blanchie Serve RN, BSN [facility] Care Management  RN Health Coach [phone] [email or web address]

## 2020-12-25 DIAGNOSIS — I10 Essential (primary) hypertension: Secondary | ICD-10-CM | POA: Diagnosis not present

## 2020-12-25 DIAGNOSIS — K219 Gastro-esophageal reflux disease without esophagitis: Secondary | ICD-10-CM | POA: Diagnosis not present

## 2020-12-27 DIAGNOSIS — I2510 Atherosclerotic heart disease of native coronary artery without angina pectoris: Secondary | ICD-10-CM | POA: Diagnosis not present

## 2020-12-27 DIAGNOSIS — R0602 Shortness of breath: Secondary | ICD-10-CM | POA: Diagnosis not present

## 2021-01-02 DIAGNOSIS — R7303 Prediabetes: Secondary | ICD-10-CM | POA: Insufficient documentation

## 2021-01-02 DIAGNOSIS — I10 Essential (primary) hypertension: Secondary | ICD-10-CM

## 2021-01-02 DIAGNOSIS — D509 Iron deficiency anemia, unspecified: Secondary | ICD-10-CM | POA: Insufficient documentation

## 2021-01-07 DIAGNOSIS — R809 Proteinuria, unspecified: Secondary | ICD-10-CM | POA: Insufficient documentation

## 2021-01-07 DIAGNOSIS — R7301 Impaired fasting glucose: Secondary | ICD-10-CM | POA: Diagnosis not present

## 2021-01-07 DIAGNOSIS — G250 Essential tremor: Secondary | ICD-10-CM | POA: Diagnosis not present

## 2021-01-07 DIAGNOSIS — R7303 Prediabetes: Secondary | ICD-10-CM | POA: Diagnosis not present

## 2021-01-07 DIAGNOSIS — E782 Mixed hyperlipidemia: Secondary | ICD-10-CM | POA: Diagnosis not present

## 2021-01-07 DIAGNOSIS — F411 Generalized anxiety disorder: Secondary | ICD-10-CM | POA: Insufficient documentation

## 2021-01-07 DIAGNOSIS — I480 Paroxysmal atrial fibrillation: Secondary | ICD-10-CM | POA: Diagnosis not present

## 2021-01-07 DIAGNOSIS — R1310 Dysphagia, unspecified: Secondary | ICD-10-CM | POA: Diagnosis not present

## 2021-01-07 DIAGNOSIS — I10 Essential (primary) hypertension: Secondary | ICD-10-CM | POA: Diagnosis not present

## 2021-01-14 DIAGNOSIS — R809 Proteinuria, unspecified: Secondary | ICD-10-CM | POA: Diagnosis not present

## 2021-01-14 DIAGNOSIS — I480 Paroxysmal atrial fibrillation: Secondary | ICD-10-CM | POA: Diagnosis not present

## 2021-01-14 DIAGNOSIS — E782 Mixed hyperlipidemia: Secondary | ICD-10-CM | POA: Diagnosis not present

## 2021-01-14 DIAGNOSIS — I10 Essential (primary) hypertension: Secondary | ICD-10-CM | POA: Diagnosis not present

## 2021-01-14 DIAGNOSIS — G250 Essential tremor: Secondary | ICD-10-CM | POA: Diagnosis not present

## 2021-01-14 DIAGNOSIS — R600 Localized edema: Secondary | ICD-10-CM | POA: Diagnosis not present

## 2021-01-14 DIAGNOSIS — R1310 Dysphagia, unspecified: Secondary | ICD-10-CM | POA: Diagnosis not present

## 2021-01-14 DIAGNOSIS — R7301 Impaired fasting glucose: Secondary | ICD-10-CM | POA: Diagnosis not present

## 2021-01-27 DIAGNOSIS — R0602 Shortness of breath: Secondary | ICD-10-CM | POA: Diagnosis not present

## 2021-01-27 DIAGNOSIS — I2510 Atherosclerotic heart disease of native coronary artery without angina pectoris: Secondary | ICD-10-CM | POA: Diagnosis not present

## 2021-01-28 ENCOUNTER — Other Ambulatory Visit: Payer: Self-pay | Admitting: *Deleted

## 2021-01-28 NOTE — Patient Outreach (Signed)
[facility]) Care Management  01/28/2021  [name] [birth year] [id number]  Unsuccessful outreach attempt made to patient. RN Health Coach left HIPAA compliant voicemail message along with her contact information.  Plan: RN Health Coach will call patient within the month of July.  Emelia Loron RN, BSN [place] [phone] [email or web address]

## 2021-01-31 DIAGNOSIS — K219 Gastro-esophageal reflux disease without esophagitis: Secondary | ICD-10-CM | POA: Diagnosis not present

## 2021-01-31 DIAGNOSIS — I10 Essential (primary) hypertension: Secondary | ICD-10-CM | POA: Diagnosis not present

## 2021-01-31 NOTE — Telephone Encounter (Signed)
Shipped date in [employer] is 01/30/2021 as of today.

## 2021-02-07 NOTE — Telephone Encounter (Signed)
No answer/ no voicemail. I attempted to call the patient to follow up to see if she received her new monitor.

## 2021-02-07 NOTE — Telephone Encounter (Signed)
Certified Letter sent 02-07-2021

## 2021-02-11 ENCOUNTER — Other Ambulatory Visit: Payer: Self-pay | Admitting: *Deleted

## 2021-02-11 NOTE — Patient Outreach (Signed)
[facility]) Care Management  [facility] Care Manager  02/11/2021   [name] 1936/02/15 [id number]  Subjective: Successful telephone outreach call to patient. HIPAA identifiers obtained. Patient states that she continues to take her B/P daily. She reports that her values have improved and typically are in the 140's/70's range. Patient explains that she is feeling better now that her B/P is in better control. Patient continues to weigh, monitor her B/P, and pulse daily. She explains that her SOB and BLE swelling has improved. Patient explains her cardiologist ran a test on her pacemaker, her battery is good, and the pacemaker is functioning well. Patient reports that [employer]OM's meals continues to deliver her food. Her swallowing is better since her esophagus dilation but at times becomes more difficult. She continues to drink [employer] brand nutritional supplement because it is more affordable and states she does drink a lot of water to stay hydrated. Patient did receive the shower chair with a back, she denies having any recent falls, she is supported by her friend Inetta Fermo and family, and states she stays active by maintaining her home. Patient did not have any further questions or concerns today and did confirm that she has this nurse's contact number to call her if needed.   Encounter Medications:  Outpatient Encounter Medications as of 02/11/2021  Medication Sig Note   amiodarone (PACERONE) 200 MG tablet Take 200 mg Daily Monday - Saturday None on Sunday    aspirin EC 81 MG tablet Take 1 tablet (81 mg total) by mouth daily. Swallow whole.    clobetasol cream (TEMOVATE) 0.05 % Apply 1 application topically 2 (two) times daily.    Cyanocobalamin (VITAMIN B-12) 5000 MCG SUBL Take 5,000 mcg by mouth daily.    diclofenac Sodium (VOLTAREN) 1 % GEL Apply 2-4 g topically every 6 (six) hours as needed (leg/knee pain.).     folic acid (FOLVITE) 400 MCG tablet Take 400 mcg by mouth daily.     furosemide (LASIX) 40 MG tablet Take 40 mg by mouth See admin instructions. Take 1 tablet (40 mg) by mouth (scheduled) in the morning, may take an additional dose if needed for swelling.    irbesartan (AVAPRO) 150 MG tablet TAKE (1) TABLET BY MOUTH ONCE DAILY.    JARDIANCE 10 MG TABS tablet Take 10 mg by mouth daily.    LORazepam (ATIVAN) 0.5 MG tablet Take 0.5 mg by mouth 2 (two) times daily.     metoprolol succinate (TOPROL-XL) 25 MG 24 hr tablet Take 3 tablets (75 mg total) by mouth in the morning. & 50 mg in the evening    Multiple Vitamin (MULTIVITAMIN WITH MINERALS) TABS tablet Take 1 tablet by mouth daily.     nitroGLYCERIN (NITROSTAT) 0.4 MG SL tablet Place 0.4 mg under the tongue every 5 (five) minutes x 3 doses as needed for chest pain.  09/25/2014: .   omeprazole (PRILOSEC) 40 MG capsule Take 1 capsule (40 mg total) by mouth daily before breakfast.    ondansetron (ZOFRAN) 4 MG tablet Take 4 mg by mouth every 6 (six) hours as needed for nausea or vomiting.     potassium chloride SA (K-DUR,KLOR-CON) 20 MEQ tablet Take 20 mEq by mouth See admin instructions. Takes 1 tablet (20 meq) by mouth daily (scheduled) & a 2nd tablet only if has to take a 2nd dose of Lasix    primidone (MYSOLINE) 50 MG tablet Take 50 mg by mouth at bedtime.    PROAIR HFA 108 (90 Base)  MCG/ACT inhaler Inhale 1 puff into the lungs every 4 (four) hours as needed for wheezing or shortness of breath.     rosuvastatin (CRESTOR) 20 MG tablet Take 1 tablet (20 mg total) by mouth at bedtime.    triamcinolone (KENALOG) 0.025 % cream SMARTSIG:Sparingly Topical Twice Daily    No facility-administered encounter medications on file as of 02/11/2021.    Functional Status:  In your present state of health, do you have any difficulty performing the following activities: 05/01/2020  Hearing? N  Vision? N  Difficulty concentrating or making decisions? N  Walking or climbing stairs? Y  Dressing or bathing? N  Doing errands, shopping?  N  Some recent data might be hidden    Fall/Depression Screening: Fall Risk  02/11/2021 12/07/2020 10/02/2020  Falls in the past year? 1 1 1   Number falls in past yr: 1 1 1   Injury with Fall? 0 0 0  Risk for fall due to : History of fall(s);Impaired balance/gait;Impaired mobility;Medication side effect Impaired mobility;Impaired balance/gait;History of fall(s) Impaired mobility;Impaired balance/gait;History of fall(s)  Follow up Falls prevention discussed;Education provided;Falls evaluation completed Falls prevention discussed;Education provided;Falls evaluation completed Falls prevention discussed;Education provided;Falls evaluation completed   PHQ 2/9 Scores 05/07/2020 04/05/2020 12/25/2015  PHQ - 2 Score 0 1 0  Exception Documentation - (No Data) -    Assessment:   Care Plan There are no care plans that you recently modified to display for this patient.    Goals Addressed             This Visit's Progress    [facility]) Patient will verbalize continuation of monitoring her B/P daily for the next 90 days       Timeframe:  Long-Range Goal Priority:  High Start Date: 05/07/20                            Expected End Date: 05/07/21                      Follow Up Date 06/02/21   - check blood pressure daily - write blood pressure results in a log or diary  Encouraged patient to limit her salt intake. -Discussed for patient to review color zones and rescue action plans located in her [facility] calendar booklet   Why is this important?   You won't feel high blood pressure, but it can still hurt your blood vessels.  High blood pressure can cause heart or kidney problems. It can also cause a stroke.  Making lifestyle changes like losing a little weight or eating less salt will help.  Checking your blood pressure at home and at different times of the day can help to control blood pressure.  If the doctor prescribes medicine remember to take it the way the doctor ordered.  Call the office if you  cannot afford the medicine or if there are questions about it.     Notes: Patient received the scale, B/P cuff, Hypertension and CHF education. Patient states she is taking her weight and B/P daily but is unable to write down her daily values due to severe tremors. Nurse reviewed signs and symptoms action plans and zones. Updated: 07/18/20  Updated 10/02/20: Patient reports continuation of taking her weight, pulse and B/P daily. She reports that her SOB has improved and her B/P and weight remain stable. Nurse reviewed signs and symptoms, action plans, and zones.  Updated 12/07/20: Patient continues to take her  B/P and pulse daily. She states her B/P values have improved with her systolic values typically not getting higher than 150 unless she gets upset or nervous.   Updated 02/11/21: Patient states that she continues to take her B/P daily. She reports that her values have improved and typically are in the 140's/70's range. Patient explains that she is feeling better now that her B/P is in better control.       [facility]) Patient will verbalize continuation of tracking or managing her heart failure within the next 90 days       Timeframe:  Long-Range Goal Priority:  High Start Date: 05/07/20                            Expected End Date: 05/07/21                    Follow Up Date 06/02/21   - eat more whole grains, fruits and vegetables, lean meats and healthy fats - follow rescue plan if symptoms flare-up - know when to call the doctor - track symptoms and what helps feel better or worse  -Encouraged patient to limit her salt intake. -Discussed for patient to review color zones and rescue action plans located in her [facility] calendar booklet   Why is this important?   You will be able to handle your symptoms better if you keep track of them.  Making some simple changes to your lifestyle will help.  Eating healthy is one thing you can do to take good care of yourself.    Notes: Patient received the  scale, B/P cuff, Hypertension and CHF education. Patient states she is taking her weight and B/P daily but is unable to write down her daily values due to severe tremors. Nurse reviewed signs and symptoms, action plans and zones. Updated: 07/18/20  Updated 10/02/20: Patient reports continuation of taking her weight, pulse and B/P daily. She reports that her SOB has improved and her B/P and weight remain stable. Nurse reviewed signs and symptoms, action plans, and zones.  Updated 12/07/20: Patient reports that she does take her weight daily and her weight has remained stable. She is monitoring for symptoms of CHF and nurse reviewed rescue action plans with patient.   Updated 02/11/21: Patient continues to weigh, monitor her B/P, and pulse daily. She explains that her SOB and BLE swelling has improved. Currently the patient states she is feeling much better and reports that her pacemaker is functioning well.      ([facility]) Remove Barriers to Better Nutrition       Timeframe:  Long-Range Goal Priority:  High Start Date: 07/18/20                            Expected End Date: 07/18/21                     Follow Up Date 06/02/21   - arrange meal delivery service like Meals on Wheels - ask for help with cooking and shopping - make food easier to swallow by adding gravy or drinking water between bites - take my supplements as prescribed  -Encouraged patient to continue to drink Ensure 2-3 times daily   Why is this important?   Eating healthy is not always easy.  You may not feel hungry or food might not taste good.  It may be a lot of  work to shop or make meals.  There are things you can do to make it easier.    Notes: Patient states she forgot to order her Mom's meals on 07/10/20 and she is not receiving any delivery. Nurse will inform SW about the Mom's meals issue and see if she can call to restart food delivery. Patient is followed by [name] for her swallowing difficulties. Encouraged patient to  eat soft foods that are easy to swallow and to drink water in between bites. Patient supplements with Ensure and nurse will send her Ensure coupons.   Updated 10/02/20: Patient reports that she is swallowing better. She states that MOM's meals will run out within the next month. Nurse will contact community resource guide to inquire if  Mom's meals can continue while the patient waits to receive Meals On Wheels. Nurse will send patient Ensure coupons.  Updated 12/07/20: Patient states that MOM's meals will continue until Meals on Wheels begin which may be another 6 month wait time. Patient continues to supplement with Ensure 2-3 times daily. Nurse will send patient Ensure coupons.   Updated 02/11/21: Patient reports that MOM's meals continues to deliver her food. Her swallowing is better since her esophagus dilation but at times becomes more difficult. She continues to drink [employer] brand nutritional supplement because it is more affordable.         Plan: RN Health Coach will call patient within the month of September. Follow-up: Patient agrees to Care Plan and Follow-up.   Blanchie Serve RN, BSN [facility] Care Management  RN Health Coach [phone] [email or web address]

## 2021-02-11 NOTE — Patient Instructions (Addendum)
Goals Addressed             This Visit's Progress    [facility]) Patient will verbalize continuation of monitoring her B/P daily for the next 90 days       Timeframe:  Long-Range Goal Priority:  High Start Date: 05/07/20                            Expected End Date: 05/07/21                      Follow Up Date 06/02/21   - check blood pressure daily - write blood pressure results in a log or diary  Encouraged patient to limit her salt intake. -Discussed for patient to review color zones and rescue action plans located in her [facility] calendar booklet   Why is this important?   You won't feel high blood pressure, but it can still hurt your blood vessels.  High blood pressure can cause heart or kidney problems. It can also cause a stroke.  Making lifestyle changes like losing a little weight or eating less salt will help.  Checking your blood pressure at home and at different times of the day can help to control blood pressure.  If the doctor prescribes medicine remember to take it the way the doctor ordered.  Call the office if you cannot afford the medicine or if there are questions about it.     Notes: Patient received the scale, B/P cuff, Hypertension and CHF education. Patient states she is taking her weight and B/P daily but is unable to write down her daily values due to severe tremors. Nurse reviewed signs and symptoms action plans and zones. Updated: 07/18/20  Updated 10/02/20: Patient reports continuation of taking her weight, pulse and B/P daily. She reports that her SOB has improved and her B/P and weight remain stable. Nurse reviewed signs and symptoms, action plans, and zones.  Updated 12/07/20: Patient continues to take her B/P and pulse daily. She states her B/P values have improved with her systolic values typically not getting higher than 150 unless she gets upset or nervous.   Updated 02/11/21: Patient states that she continues to take her B/P daily. She reports that her values  have improved and typically are in the 140's/70's range. Patient explains that she is feeling better now that her B/P is in better control.       [facility]) Patient will verbalize continuation of tracking or managing her heart failure within the next 90 days       Timeframe:  Long-Range Goal Priority:  High Start Date: 05/07/20                            Expected End Date: 05/07/21                    Follow Up Date 06/02/21   - eat more whole grains, fruits and vegetables, lean meats and healthy fats - follow rescue plan if symptoms flare-up - know when to call the doctor - track symptoms and what helps feel better or worse  -Encouraged patient to limit her salt intake. -Discussed for patient to review color zones and rescue action plans located in her [facility] calendar booklet   Why is this important?   You will be able to handle your symptoms better if you keep track of them.  Making some simple changes to your lifestyle will help.  Eating healthy is one thing you can do to take good care of yourself.    Notes: Patient received the scale, B/P cuff, Hypertension and CHF education. Patient states she is taking her weight and B/P daily but is unable to write down her daily values due to severe tremors. Nurse reviewed signs and symptoms, action plans and zones. Updated: 07/18/20  Updated 10/02/20: Patient reports continuation of taking her weight, pulse and B/P daily. She reports that her SOB has improved and her B/P and weight remain stable. Nurse reviewed signs and symptoms, action plans, and zones.  Updated 12/07/20: Patient reports that she does take her weight daily and her weight has remained stable. She is monitoring for symptoms of CHF and nurse reviewed rescue action plans with patient.   Updated 02/11/21: Patient continues to weigh, monitor her B/P, and pulse daily. She explains that her SOB and BLE swelling has improved. Currently the patient states she is feeling much better and reports that  her pacemaker is functioning well.      ([facility]) Remove Barriers to Better Nutrition       Timeframe:  Long-Range Goal Priority:  High Start Date: 07/18/20                            Expected End Date: 07/18/21                     Follow Up Date 06/02/21   - arrange meal delivery service like Meals on Wheels - ask for help with cooking and shopping - make food easier to swallow by adding gravy or drinking water between bites - take my supplements as prescribed  -Encouraged patient to continue to drink Ensure 2-3 times daily   Why is this important?   Eating healthy is not always easy.  You may not feel hungry or food might not taste good.  It may be a lot of work to shop or make meals.  There are things you can do to make it easier.    Notes: Patient states she forgot to order her Mom's meals on 07/10/20 and she is not receiving any delivery. Nurse will inform SW about the Mom's meals issue and see if she can call to restart food delivery. Patient is followed by Dr. Laural Golden for her swallowing difficulties. Encouraged patient to eat soft foods that are easy to swallow and to drink water in between bites. Patient supplements with Ensure and nurse will send her Ensure coupons.   Updated 10/02/20: Patient reports that she is swallowing better. She states that MOM's meals will run out within the next month. Nurse will contact community resource guide to inquire if  Mom's meals can continue while the patient waits to receive Meals On Wheels. Nurse will send patient Ensure coupons.  Updated 12/07/20: Patient states that MOM's meals will continue until Meals on Wheels begin which may be another 6 month wait time. Patient continues to supplement with Ensure 2-3 times daily. Nurse will send patient Ensure coupons.   Updated 02/11/21: Patient reports that [employer] meals continues to deliver her food. Her swallowing is better since her esophagus dilation but at times becomes more difficult. She continues to  drink [employer] brand nutritional supplement because it is more affordable.

## 2021-02-13 ENCOUNTER — Telehealth: Payer: Self-pay

## 2021-02-13 NOTE — Telephone Encounter (Signed)
I spoke with the patient and she agreed to have someone set up the monitor tomorrow and help her send the transmissions. However with her trimmers I do not see the patient being able to send transmissions when needed. She might just need to stick with coming into the office to have her pacemaker check.

## 2021-02-14 ENCOUNTER — Ambulatory Visit (INDEPENDENT_AMBULATORY_CARE_PROVIDER_SITE_OTHER): Payer: Medicare Other

## 2021-02-14 DIAGNOSIS — I495 Sick sinus syndrome: Secondary | ICD-10-CM | POA: Diagnosis not present

## 2021-02-14 LAB — CUP PACEART REMOTE DEVICE CHECK
Battery Impedance: 2350 Ohm
Battery Remaining Longevity: 30 mo
Battery Voltage: 2.74 V
Brady Statistic AP VP Percent: 11 %
Brady Statistic AP VS Percent: 57 %
Brady Statistic AS VP Percent: 1 %
Brady Statistic AS VS Percent: 31 %
Date Time Interrogation Session: 20220714122528
Implantable Lead Implant Date: 20100526
Implantable Lead Implant Date: 20100526
Implantable Lead Location: 753859
Implantable Lead Location: 753860
Implantable Lead Model: 5076
Implantable Lead Model: 5076
Implantable Pulse Generator Implant Date: 20100526
Lead Channel Impedance Value: 443 Ohm
Lead Channel Impedance Value: 527 Ohm
Lead Channel Pacing Threshold Amplitude: 0.625 V
Lead Channel Pacing Threshold Amplitude: 0.875 V
Lead Channel Pacing Threshold Pulse Width: 0.4 ms
Lead Channel Pacing Threshold Pulse Width: 0.4 ms
Lead Channel Setting Pacing Amplitude: 2 V
Lead Channel Setting Pacing Amplitude: 2.5 V
Lead Channel Setting Pacing Pulse Width: 0.46 ms
Lead Channel Setting Sensing Sensitivity: 2.8 mV

## 2021-02-26 DIAGNOSIS — I2510 Atherosclerotic heart disease of native coronary artery without angina pectoris: Secondary | ICD-10-CM | POA: Diagnosis not present

## 2021-02-26 DIAGNOSIS — R0602 Shortness of breath: Secondary | ICD-10-CM | POA: Diagnosis not present

## 2021-03-03 DIAGNOSIS — I10 Essential (primary) hypertension: Secondary | ICD-10-CM | POA: Diagnosis not present

## 2021-03-03 DIAGNOSIS — K219 Gastro-esophageal reflux disease without esophagitis: Secondary | ICD-10-CM | POA: Diagnosis not present

## 2021-03-04 ENCOUNTER — Other Ambulatory Visit: Payer: Self-pay

## 2021-03-04 ENCOUNTER — Ambulatory Visit: Payer: Medicare Other | Admitting: Cardiology

## 2021-03-04 ENCOUNTER — Encounter: Payer: Self-pay | Admitting: Cardiology

## 2021-03-04 VITALS — BP 162/78 | HR 70 | Ht 65.5 in | Wt 141.4 lb

## 2021-03-04 DIAGNOSIS — I480 Paroxysmal atrial fibrillation: Secondary | ICD-10-CM | POA: Diagnosis not present

## 2021-03-04 DIAGNOSIS — I25119 Atherosclerotic heart disease of native coronary artery with unspecified angina pectoris: Secondary | ICD-10-CM | POA: Diagnosis not present

## 2021-03-04 DIAGNOSIS — I495 Sick sinus syndrome: Secondary | ICD-10-CM | POA: Diagnosis not present

## 2021-03-04 NOTE — Patient Instructions (Signed)
Medication Instructions:  Your physician recommends that you continue on your current medications as directed. Please refer to the Current Medication list given to you today.  *If you need a refill on your cardiac medications before your next appointment, please call your pharmacy*   Lab Work: None today  If you have labs (blood work) drawn today and your tests are completely normal, you will receive your results only by: [place] (if you have MyChart) OR A paper copy in the mail If you have any lab test that is abnormal or we need to change your treatment, we will call you to review the results.   Testing/Procedures: None today    Follow-Up: At [facility], you and your health needs are our priority.  As part of our continuing mission to provide you with exceptional heart care, we have created designated Provider Care Teams.  These Care Teams include your primary Cardiologist (physician) and Advanced Practice Providers (APPs -  Physician Assistants and Nurse Practitioners) who all work together to provide you with the care you need, when you need it.  We recommend signing up for the patient portal called "MyChart".  Sign up information is provided on this After Visit Summary.  MyChart is used to connect with patients for Virtual Visits (Telemedicine).  Patients are able to view lab/test results, encounter notes, upcoming appointments, etc.  Non-urgent messages can be sent to your provider as well.   To learn more about what you can do with MyChart, go to [email or web address].    Your next appointment:   6 month(s)  The format for your next appointment:   In Person  Provider:   You may see Rozann Lesches, MD   Other Instructions None

## 2021-03-04 NOTE — Progress Notes (Signed)
Cardiology Office Note  Date: 03/04/2021   ID: [name], DOB Dec 08, 1935, MRN [id number]  PCP:  Katrina Squibb, MD  Cardiologist:  Katrina Lesches, MD Electrophysiologist:  Katrina Peru, MD   Chief Complaint  Patient presents with   Cardiac follow-up    History of Present Illness: [name] is an 85 y.o. female last seen in January.  She is here for a routine visit with her daughter.  She does not describe any active angina at this time, no palpitations.  Her blood pressure was significantly elevated when she came in, I rechecked it after several minutes at 162/78.  She states that she has been taking her medications regularly and does check her blood pressure at home.  She has a Medtronic pacemaker in place followed by Dr. Lovena Le.  Recent device check revealed 0.2% AF burden.  I reviewed her recent lab work from June.  She continues to follow with Dr. Nevada Crane.  I went over her medications below.  I personally reviewed her ECG today which shows an atrial paced rhythm with PACs.  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   CATARACT EXTRACTION     CHOLECYSTECTOMY     COLONOSCOPY  2011   COLONOSCOPY N/A 10/19/2014   Procedure:  COLONOSCOPY;  Surgeon: Rogene Houston, MD;  [facility];  Service: Endoscopy;  Laterality: N/A;  1030   ESOPHAGEAL DILATION N/A 05/02/2020   Procedure: ESOPHAGEAL DILATION;  Surgeon: Rogene Houston, MD;  Location: [facility];  Service: Gastroenterology;  Laterality: N/A;   ESOPHAGOGASTRODUODENOSCOPY N/A 10/26/2014   Procedure: ESOPHAGOGASTRODUODENOSCOPY (EGD);  Surgeon: Rogene Houston, MD;  [facility];  Service: Endoscopy;  Laterality: N/A;  50 - Dr. has lunch and learn   ESOPHAGOGASTRODUODENOSCOPY (EGD) WITH PROPOFOL N/A 05/02/2020   Procedure: ESOPHAGOGASTRODUODENOSCOPY (EGD) WITH PROPOFOL;  Surgeon: Rogene Houston, MD;  [facility];  Service: Gastroenterology;  Laterality: N/A;  250   Esophagogastroduodenoscopy with esophageal dilation  2004, 2006, 2007   GIVENS CAPSULE STUDY N/A 10/31/2014   Procedure: GIVENS CAPSULE STUDY;  Surgeon: Rogene Houston, MD;  [facility];  Service: Endoscopy;  Laterality: N/A;  730 -- pacemaker--needs monitoring--outpatient bed   INSERT / REPLACE / REMOVE PACEMAKER  2010   LEFT HEART CATHETERIZATION WITH CORONARY ANGIOGRAM N/A 11/17/2011   Procedure: LEFT HEART CATHETERIZATION WITH CORONARY ANGIOGRAM;  Surgeon: Sherren Mocha, MD;  Location: [facility] CATH LAB;  Service: Cardiovascular;  Laterality: N/A;   LEFT HEART CATHETERIZATION WITH CORONARY ANGIOGRAM N/A 09/26/2014   Procedure: LEFT  HEART CATHETERIZATION WITH CORONARY ANGIOGRAM;  Surgeon: Leonie Man, MD;  Location: [facility] C[facility] LAB;  Service: Cardiovascular;  Laterality: N/A;   Right rotator cuff repair     SHOULDER ACROMIOPLASTY Right 05/30/2015   Procedure: RIGHT SHOULDER ACROMIOPLASTY;  Surgeon: Carole Civil, MD;  Location: [facility] OR[facility];  Service: Orthopedics;  Laterality: Right;   SHOULDER OPEN ROTATOR CUFF REPAIR Right 05/30/2015   Procedure: OPEN ROTATOR CUFF REPAIR RIGHT SHOULDER;  Surgeon: Carole Civil, MD;  Location: [facility] OR[facility];  Service: Orthopedics;   Laterality: Right;   SHOULDER OPEN ROTATOR CUFF REPAIR Left 10/22/2016   Procedure: ROTATOR CUFF REPAIR SHOULDER OPEN;  Surgeon: Carole Civil, MD;  Location: [facility] OR[facility];  Service: Orthopedics;  Laterality: Left;   Subxiphoid pericardial window  11/10   VAGINAL HYSTERECTOMY      Current Outpatient Medications  Medication Sig Dispense Refill   ALPRAZolam (XANAX) 1 MG tablet Take 1 mg by mouth 3 (three) times daily as needed.     amiodarone (PACERONE) 200 MG tablet Take 200 mg Daily Monday - Saturday None on Sunday 90 tablet 3   aspirin EC 81 MG tablet Take 1 tablet (81 mg total) by mouth daily. Swallow whole. 90 tablet 3   Cyanocobalamin (VITAMIN B-12) 5000 MCG SUBL Take 5,000 mcg by mouth daily.     diclofenac Sodium (VOLTAREN) 1 % GEL Apply 2-4 g topically every 6 (six) hours as needed (leg/knee pain.).      folic acid (FOLVITE) A999333 MCG tablet Take 400 mcg by mouth daily.     furosemide (LASIX) 40 MG tablet Take 40 mg by mouth See admin instructions. Take 1 tablet (40 mg) by mouth (scheduled) in the morning, may take an additional dose if needed for swelling.     irbesartan (AVAPRO) 150 MG tablet TAKE (1) TABLET BY MOUTH ONCE DAILY. 90 tablet 3   JARDIANCE 10 MG TABS tablet Take 10 mg by mouth daily.     metoprolol succinate (TOPROL-XL) 25 MG 24 hr tablet Take 3 tablets (75 mg total) by mouth in the morning. & 50 mg in the evening 450 tablet 3   Multiple Vitamin (MULTIVITAMIN WITH MINERALS) TABS tablet Take 1 tablet by mouth daily.      nitroGLYCERIN (NITROSTAT) 0.4 MG SL tablet Place 0.4 mg under the tongue every 5 (five) minutes x 3 doses as needed for chest pain.      omeprazole (PRILOSEC) 40 MG capsule Take 1 capsule (40 mg total) by mouth daily before breakfast. 30 capsule 5   ondansetron (ZOFRAN) 4 MG tablet Take 4 mg by mouth every 6 (six) hours as needed for nausea or vomiting.      potassium chloride SA (K-DUR,KLOR-CON) 20 MEQ tablet Take 20 mEq by mouth See admin instructions. Takes  1 tablet (20 meq) by mouth daily (scheduled) & a 2nd tablet only if has to take a 2nd dose of Lasix     primidone (MYSOLINE) 50 MG tablet Take 50 mg by mouth at bedtime.     PROAIR HFA 108 (90 Base) MCG/ACT inhaler Inhale 1 puff into the lungs every 4 (four) hours as needed for wheezing or shortness of breath.      rosuvastatin (CRESTOR) 20 MG tablet Take 1 tablet (20 mg total) by mouth at bedtime. 90 tablet 3   VITAMIN D, CHOLECALCIFEROL, PO Take by mouth.     clobetasol cream (TEMOVATE) [id number] % Apply 1 application topically 2 (two) times daily. (Patient not taking: Reported on 03/04/2021) 30  g 0   LORazepam (ATIVAN) 0.5 MG tablet Take 0.5 mg by mouth 2 (two) times daily.  (Patient not taking: Reported on 03/04/2021)     triamcinolone (KENALOG) 0.025 % cream SMARTSIG:Sparingly Topical Twice Daily (Patient not taking: Reported on 03/04/2021)     No current facility-administered medications for this visit.   Allergies:  Amitriptyline hcl and Sulfonamide derivatives   ROS: Chronic tremor, hearing loss.  No syncope.  Physical Exam: VS:  BP (!) 162/78   Pulse 70   Ht 5' 5.5" (1.664 m)   Wt 141 lb 6.4 oz (64.1 kg)   SpO2 97%   BMI 23.17 kg/m , BMI Body mass index is 23.17 kg/m.  Wt Readings from Last 3 Encounters:  03/04/21 141 lb 6.4 oz (64.1 kg)  09/03/20 146 lb (66.2 kg)  08/07/20 150 lb (68 kg)    General: Elderly woman, appears comfortable at rest.  Chronic tremor. HEENT: Conjunctiva and lids normal, wearing a mask. Neck: Supple, no elevated JVP or carotid bruits, no thyromegaly. Lungs: Clear to auscultation, nonlabored breathing at rest. Cardiac: Regular rate and rhythm with occasional ectopy, no S3, 2/6 systolic murmur, no pericardial rub. Extremities: No pitting edema.  ECG:  An ECG dated 05/01/2020 was personally reviewed today and demonstrated:  Possible atrial paced rhythm with tremor artifact.  Recent Labwork:  June 2022: Hemoglobin A1c 5.4%, cholesterol 267, triglycerides  259, HDL 46, LDL 172, BUN 9, creatinine 0.79, potassium 4.4, AST 22, ALT 10, hemoglobin 12.6, platelets 212  Other Studies Reviewed Today:  Echocardiogram 06/01/2017: Study Conclusions   - Left ventricle: The cavity size was normal. Wall thickness was   normal. Systolic function was vigorous. The estimated ejection   fraction was in the range of 65% to 70%. The study is not   technically sufficient to allow evaluation of LV diastolic   function. - Aortic valve: There was mild regurgitation. - Mitral valve: Calcified annulus. Mildly thickened leaflets .   There was mild regurgitation. - Left atrium: The atrium was moderately dilated. - Right atrium: The atrium was mildly dilated.  Assessment and Plan:  1.  CAD status post DES to the RCA in 2010.  We will continue observation in the absence of angina symptoms.  ECG reviewed.  She is on aspirin, Crestor, Jardiance, Toprol-XL, and as needed nitroglycerin.  2.  Paroxysmal to persistent atrial fibrillation with CHA2DS2-VASc score of 5.  She is not anticoagulated based on prior discussions and patient preference.  She has a very low rhythm burden based on device interrogation and continues on amiodarone for rhythm suppression.  Recent lab work reviewed.  3.  Sinus node dysfunction with Medtronic pacemaker in place and followed by Dr. Lovena Le.  Recent interrogation revealed normal function.  Medication Adjustments/Labs and Tests Ordered: Current medicines are reviewed at length with the patient today.  Concerns regarding medicines are outlined above.   Tests Ordered: Orders Placed This Encounter  Procedures   EKG 12-Lead    Medication Changes: No orders of the defined types were placed in this encounter.   Disposition:  Follow up  6 months.  Signed, Satira Sark, MD, [facility] 03/04/2021 1:19 PM    [place] [facility] [address], [place], [place] [address] Phone: [phone]; Fax: [phone]

## 2021-03-09 NOTE — Progress Notes (Signed)
Remote pacemaker transmission.   

## 2021-03-19 ENCOUNTER — Emergency Department (HOSPITAL_COMMUNITY): Payer: Medicare Other

## 2021-03-19 ENCOUNTER — Encounter (HOSPITAL_COMMUNITY): Payer: Self-pay

## 2021-03-19 ENCOUNTER — Inpatient Hospital Stay (HOSPITAL_COMMUNITY)
Admission: EM | Admit: 2021-03-19 | Discharge: 2021-03-22 | DRG: 281 | Disposition: A | Discharge status: Home Health | Payer: Medicare Other | Attending: Internal Medicine | Admitting: Internal Medicine

## 2021-03-19 ENCOUNTER — Inpatient Hospital Stay (HOSPITAL_COMMUNITY): Payer: Medicare Other

## 2021-03-19 DIAGNOSIS — Z7902 Long term (current) use of antithrombotics/antiplatelets: Secondary | ICD-10-CM | POA: Diagnosis not present

## 2021-03-19 DIAGNOSIS — E785 Hyperlipidemia, unspecified: Secondary | ICD-10-CM | POA: Diagnosis not present

## 2021-03-19 DIAGNOSIS — I110 Hypertensive heart disease with heart failure: Secondary | ICD-10-CM | POA: Diagnosis not present

## 2021-03-19 DIAGNOSIS — K869 Disease of pancreas, unspecified: Secondary | ICD-10-CM | POA: Diagnosis not present

## 2021-03-19 DIAGNOSIS — Z7984 Long term (current) use of oral hypoglycemic drugs: Secondary | ICD-10-CM

## 2021-03-19 DIAGNOSIS — Z955 Presence of coronary angioplasty implant and graft: Secondary | ICD-10-CM

## 2021-03-19 DIAGNOSIS — M47814 Spondylosis without myelopathy or radiculopathy, thoracic region: Secondary | ICD-10-CM | POA: Diagnosis not present

## 2021-03-19 DIAGNOSIS — Z9049 Acquired absence of other specified parts of digestive tract: Secondary | ICD-10-CM

## 2021-03-19 DIAGNOSIS — Z825 Family history of asthma and other chronic lower respiratory diseases: Secondary | ICD-10-CM | POA: Diagnosis not present

## 2021-03-19 DIAGNOSIS — Z7982 Long term (current) use of aspirin: Secondary | ICD-10-CM

## 2021-03-19 DIAGNOSIS — I5032 Chronic diastolic (congestive) heart failure: Secondary | ICD-10-CM | POA: Diagnosis present

## 2021-03-19 DIAGNOSIS — R079 Chest pain, unspecified: Secondary | ICD-10-CM | POA: Diagnosis not present

## 2021-03-19 DIAGNOSIS — I4819 Other persistent atrial fibrillation: Secondary | ICD-10-CM | POA: Diagnosis present

## 2021-03-19 DIAGNOSIS — J432 Centrilobular emphysema: Secondary | ICD-10-CM | POA: Diagnosis not present

## 2021-03-19 DIAGNOSIS — I214 Non-ST elevation (NSTEMI) myocardial infarction: Principal | ICD-10-CM | POA: Diagnosis present

## 2021-03-19 DIAGNOSIS — K5900 Constipation, unspecified: Secondary | ICD-10-CM | POA: Diagnosis not present

## 2021-03-19 DIAGNOSIS — Z809 Family history of malignant neoplasm, unspecified: Secondary | ICD-10-CM

## 2021-03-19 DIAGNOSIS — F419 Anxiety disorder, unspecified: Secondary | ICD-10-CM | POA: Diagnosis present

## 2021-03-19 DIAGNOSIS — R9431 Abnormal electrocardiogram [ECG] [EKG]: Secondary | ICD-10-CM | POA: Diagnosis present

## 2021-03-19 DIAGNOSIS — Z9114 Patient's other noncompliance with medication regimen: Secondary | ICD-10-CM

## 2021-03-19 DIAGNOSIS — Z79899 Other long term (current) drug therapy: Secondary | ICD-10-CM

## 2021-03-19 DIAGNOSIS — R778 Other specified abnormalities of plasma proteins: Secondary | ICD-10-CM

## 2021-03-19 DIAGNOSIS — I169 Hypertensive crisis, unspecified: Secondary | ICD-10-CM | POA: Diagnosis present

## 2021-03-19 DIAGNOSIS — I25110 Atherosclerotic heart disease of native coronary artery with unstable angina pectoris: Secondary | ICD-10-CM | POA: Diagnosis not present

## 2021-03-19 DIAGNOSIS — I495 Sick sinus syndrome: Secondary | ICD-10-CM | POA: Diagnosis not present

## 2021-03-19 DIAGNOSIS — I480 Paroxysmal atrial fibrillation: Secondary | ICD-10-CM | POA: Diagnosis present

## 2021-03-19 DIAGNOSIS — Z20822 Contact with and (suspected) exposure to covid-19: Secondary | ICD-10-CM | POA: Diagnosis not present

## 2021-03-19 DIAGNOSIS — I160 Hypertensive urgency: Secondary | ICD-10-CM | POA: Diagnosis not present

## 2021-03-19 DIAGNOSIS — I10 Essential (primary) hypertension: Secondary | ICD-10-CM | POA: Diagnosis not present

## 2021-03-19 DIAGNOSIS — J449 Chronic obstructive pulmonary disease, unspecified: Secondary | ICD-10-CM | POA: Diagnosis not present

## 2021-03-19 DIAGNOSIS — R000 Tachycardia, unspecified: Secondary | ICD-10-CM | POA: Diagnosis not present

## 2021-03-19 DIAGNOSIS — I517 Cardiomegaly: Secondary | ICD-10-CM | POA: Diagnosis not present

## 2021-03-19 DIAGNOSIS — M47816 Spondylosis without myelopathy or radiculopathy, lumbar region: Secondary | ICD-10-CM | POA: Diagnosis not present

## 2021-03-19 DIAGNOSIS — I25118 Atherosclerotic heart disease of native coronary artery with other forms of angina pectoris: Secondary | ICD-10-CM | POA: Diagnosis not present

## 2021-03-19 DIAGNOSIS — Z8249 Family history of ischemic heart disease and other diseases of the circulatory system: Secondary | ICD-10-CM | POA: Diagnosis not present

## 2021-03-19 DIAGNOSIS — K219 Gastro-esophageal reflux disease without esophagitis: Secondary | ICD-10-CM | POA: Diagnosis present

## 2021-03-19 DIAGNOSIS — H9190 Unspecified hearing loss, unspecified ear: Secondary | ICD-10-CM | POA: Diagnosis not present

## 2021-03-19 DIAGNOSIS — R7989 Other specified abnormal findings of blood chemistry: Secondary | ICD-10-CM | POA: Diagnosis present

## 2021-03-19 DIAGNOSIS — E876 Hypokalemia: Secondary | ICD-10-CM | POA: Diagnosis not present

## 2021-03-19 DIAGNOSIS — R0689 Other abnormalities of breathing: Secondary | ICD-10-CM | POA: Diagnosis not present

## 2021-03-19 DIAGNOSIS — I161 Hypertensive emergency: Secondary | ICD-10-CM | POA: Diagnosis not present

## 2021-03-19 DIAGNOSIS — T465X6A Underdosing of other antihypertensive drugs, initial encounter: Secondary | ICD-10-CM | POA: Diagnosis present

## 2021-03-19 DIAGNOSIS — R0602 Shortness of breath: Secondary | ICD-10-CM | POA: Diagnosis not present

## 2021-03-19 DIAGNOSIS — R001 Bradycardia, unspecified: Secondary | ICD-10-CM | POA: Diagnosis not present

## 2021-03-19 DIAGNOSIS — Z87891 Personal history of nicotine dependence: Secondary | ICD-10-CM

## 2021-03-19 DIAGNOSIS — K8689 Other specified diseases of pancreas: Secondary | ICD-10-CM | POA: Diagnosis present

## 2021-03-19 DIAGNOSIS — I2510 Atherosclerotic heart disease of native coronary artery without angina pectoris: Secondary | ICD-10-CM | POA: Diagnosis not present

## 2021-03-19 DIAGNOSIS — Z743 Need for continuous supervision: Secondary | ICD-10-CM | POA: Diagnosis not present

## 2021-03-19 DIAGNOSIS — Z90710 Acquired absence of both cervix and uterus: Secondary | ICD-10-CM

## 2021-03-19 DIAGNOSIS — Z888 Allergy status to other drugs, medicaments and biological substances status: Secondary | ICD-10-CM

## 2021-03-19 DIAGNOSIS — I4891 Unspecified atrial fibrillation: Secondary | ICD-10-CM | POA: Diagnosis present

## 2021-03-19 DIAGNOSIS — Z950 Presence of cardiac pacemaker: Secondary | ICD-10-CM

## 2021-03-19 DIAGNOSIS — Z882 Allergy status to sulfonamides status: Secondary | ICD-10-CM

## 2021-03-19 LAB — COMPREHENSIVE METABOLIC PANEL
ALT: 20 U/L (ref 0–44)
AST: 28 U/L (ref 15–41)
Albumin: 3.9 g/dL (ref 3.5–5.0)
Alkaline Phosphatase: 85 U/L (ref 38–126)
Anion gap: 4 — ABNORMAL LOW (ref 5–15)
BUN: 11 mg/dL (ref 8–23)
CO2: 27 mmol/L (ref 22–32)
Calcium: 8.9 mg/dL (ref 8.9–10.3)
Chloride: 107 mmol/L (ref 98–111)
Creatinine, Ser: 0.64 mg/dL (ref 0.44–1.00)
GFR, Estimated: >60 mL/min (ref >60)
Glucose, Bld: 103 mg/dL — ABNORMAL HIGH (ref 70–99)
Potassium: 3.4 mmol/L — ABNORMAL LOW (ref 3.5–5.1)
Sodium: 138 mmol/L (ref 135–145)
Total Bilirubin: 0.4 mg/dL (ref 0.3–1.2)
Total Protein: 8.1 g/dL (ref 6.5–8.1)

## 2021-03-19 LAB — CBC WITH DIFFERENTIAL/PLATELET
Abs Immature Granulocytes: 0.03 10*3/uL (ref 0.00–0.07)
Basophils Absolute: 0 10*3/uL (ref 0.0–0.1)
Basophils Relative: 1 %
Eosinophils Absolute: 0.1 10*3/uL (ref 0.0–0.5)
Eosinophils Relative: 1 %
HCT: 40.3 % (ref 36.0–46.0)
Hemoglobin: 13 g/dL (ref 12.0–15.0)
Immature Granulocytes: 0 %
Lymphocytes Relative: 26 %
Lymphs Abs: 2.3 10*3/uL (ref 0.7–4.0)
MCH: 31.2 pg (ref 26.0–34.0)
MCHC: 32.3 g/dL (ref 30.0–36.0)
MCV: 96.6 fL (ref 80.0–100.0)
Monocytes Absolute: 0.8 10*3/uL (ref 0.1–1.0)
Monocytes Relative: 9 %
Neutro Abs: 5.4 10*3/uL (ref 1.7–7.7)
Neutrophils Relative %: 63 %
Platelets: 216 10*3/uL (ref 150–400)
RBC: 4.17 MIL/uL (ref 3.87–5.11)
RDW: 13.8 % (ref 11.5–15.5)
WBC: 8.6 10*3/uL (ref 4.0–10.5)
nRBC: 0 % (ref 0.0–0.2)

## 2021-03-19 LAB — TROPONIN I (HIGH SENSITIVITY): Troponin I (High Sensitivity): 2030 ng/L (ref <18)

## 2021-03-19 MED ORDER — OXYCODONE HCL 5 MG PO TABS
5.0000 mg | ORAL_TABLET | ORAL | Status: DC | PRN
Start: 2021-03-19 — End: 2021-03-22
  Filled 2021-03-19: qty 1

## 2021-03-19 MED ORDER — HEPARIN SODIUM (PORCINE) 5000 UNIT/ML IJ SOLN
3000.0000 [IU] | Freq: Once | INTRAMUSCULAR | Status: AC
  Administered 2021-03-19: 3000 [IU] via INTRAVENOUS

## 2021-03-19 MED ORDER — LORAZEPAM 2 MG/ML IJ SOLN
INTRAMUSCULAR | Status: AC
  Administered 2021-03-19: 1 mg via INTRAVENOUS
  Filled 2021-03-19: qty 1

## 2021-03-19 MED ORDER — AMIODARONE HCL 200 MG PO TABS
200.0000 mg | ORAL_TABLET | ORAL | Status: DC
  Administered 2021-03-20 – 2021-03-22 (×3): 200 mg via ORAL
  Filled 2021-03-19 (×3): qty 1

## 2021-03-19 MED ORDER — ASPIRIN 325 MG PO TABS
325.0000 mg | ORAL_TABLET | Freq: Once | ORAL | Status: AC
  Administered 2021-03-19: 325 mg via ORAL
  Filled 2021-03-19: qty 1

## 2021-03-19 MED ORDER — METOPROLOL SUCCINATE ER 50 MG PO TB24
75.0000 mg | ORAL_TABLET | Freq: Every day | ORAL | Status: DC
  Administered 2021-03-20 – 2021-03-22 (×3): 75 mg via ORAL
  Filled 2021-03-19 (×3): qty 1

## 2021-03-19 MED ORDER — HEPARIN (PORCINE) 25000 UT/250ML-% IV SOLN
INTRAVENOUS | Status: AC
  Administered 2021-03-19: 750 [IU]/h via INTRAVENOUS
  Filled 2021-03-19: qty 250

## 2021-03-19 MED ORDER — HEPARIN (PORCINE) 25000 UT/250ML-% IV SOLN
1100.0000 [IU]/h | INTRAVENOUS | Status: DC
  Administered 2021-03-20: 1000 [IU]/h via INTRAVENOUS
  Filled 2021-03-19: qty 250

## 2021-03-19 MED ORDER — LORAZEPAM 2 MG/ML IJ SOLN: 1.0000 mg | Freq: Once | INTRAMUSCULAR | Status: AC

## 2021-03-19 MED ORDER — ASPIRIN EC 81 MG PO TBEC
81.0000 mg | DELAYED_RELEASE_TABLET | Freq: Every day | ORAL | Status: DC
  Administered 2021-03-20 – 2021-03-22 (×2): 81 mg via ORAL
  Filled 2021-03-19 (×2): qty 1

## 2021-03-19 MED ORDER — ROSUVASTATIN CALCIUM 20 MG PO TABS
20.0000 mg | ORAL_TABLET | Freq: Every day | ORAL | Status: DC
  Administered 2021-03-20: 20 mg via ORAL
  Filled 2021-03-19: qty 1

## 2021-03-19 MED ORDER — POTASSIUM CHLORIDE 10 MEQ/100ML IV SOLN
10.0000 meq | INTRAVENOUS | Status: AC
  Administered 2021-03-20 (×2): 10 meq via INTRAVENOUS
  Filled 2021-03-19 (×2): qty 100

## 2021-03-19 MED ORDER — METOPROLOL SUCCINATE ER 50 MG PO TB24
50.0000 mg | ORAL_TABLET | ORAL | Status: AC
  Administered 2021-03-19: 50 mg via ORAL
  Filled 2021-03-19: qty 1

## 2021-03-19 MED ORDER — NITROGLYCERIN IN D5W 200-5 MCG/ML-% IV SOLN
5.0000 ug/min | INTRAVENOUS | Status: DC
  Administered 2021-03-19: 5 ug/min via INTRAVENOUS
  Filled 2021-03-19: qty 250

## 2021-03-19 MED ORDER — POTASSIUM CHLORIDE CRYS ER 20 MEQ PO TBCR
20.0000 meq | EXTENDED_RELEASE_TABLET | Freq: Once | ORAL | Status: AC
  Administered 2021-03-20: 20 meq via ORAL
  Filled 2021-03-19: qty 1

## 2021-03-19 MED ORDER — IOHEXOL 350 MG/ML SOLN
100.0000 mL | Freq: Once | INTRAVENOUS | Status: AC | PRN
  Administered 2021-03-20: 100 mL via INTRAVENOUS

## 2021-03-19 MED ORDER — PANTOPRAZOLE SODIUM 40 MG PO TBEC
40.0000 mg | DELAYED_RELEASE_TABLET | Freq: Every day | ORAL | Status: DC
  Administered 2021-03-20 – 2021-03-22 (×2): 40 mg via ORAL
  Filled 2021-03-19 (×2): qty 1

## 2021-03-19 MED ORDER — PRIMIDONE 50 MG PO TABS
50.0000 mg | ORAL_TABLET | Freq: Every day | ORAL | Status: DC
  Administered 2021-03-20 – 2021-03-21 (×2): 50 mg via ORAL
  Filled 2021-03-19 (×3): qty 1

## 2021-03-19 MED ORDER — IRBESARTAN 150 MG PO TABS
150.0000 mg | ORAL_TABLET | Freq: Every day | ORAL | Status: DC
  Administered 2021-03-20 – 2021-03-22 (×3): 150 mg via ORAL
  Filled 2021-03-19 (×2): qty 1

## 2021-03-19 MED ORDER — NITROGLYCERIN IN D5W 200-5 MCG/ML-% IV SOLN
5.0000 ug/min | INTRAVENOUS | Status: DC
Start: 2021-03-19 — End: 2021-03-22
  Administered 2021-03-19: 20 ug/min via INTRAVENOUS

## 2021-03-19 MED ORDER — METOPROLOL SUCCINATE ER 50 MG PO TB24
50.0000 mg | ORAL_TABLET | Freq: Every day | ORAL | Status: DC
  Administered 2021-03-20 – 2021-03-21 (×3): 50 mg via ORAL
  Filled 2021-03-19 (×4): qty 1

## 2021-03-19 MED ORDER — ACETAMINOPHEN 325 MG PO TABS: 650.0000 mg | ORAL_TABLET | ORAL | Status: DC | PRN

## 2021-03-19 MED ORDER — ALPRAZOLAM 0.5 MG PO TABS
1.0000 mg | ORAL_TABLET | Freq: Three times a day (TID) | ORAL | Status: DC | PRN
  Administered 2021-03-20: 1 mg via ORAL
  Filled 2021-03-19: qty 2

## 2021-03-19 NOTE — ED Notes (Signed)
Pt placed on cardiac monitor with BP to set cycle every 30 minutes. Continuous pulse oximeter applied.  

## 2021-03-19 NOTE — ED Notes (Signed)
Pt placed on cardiac monitor with BP to set cycle every 5 minutes. Continuous pulse oximeter applied.

## 2021-03-19 NOTE — ED Provider Notes (Signed)
[facility] EMERGENCY DEPARTMENT Provider Note   CSN: [id number] Arrival date & time: 03/19/21  1844     History Chief Complaint  Patient presents with   Hypertension    [name] is a 85 y.o. female.  Patient called EMS because her blood pressure machine at home was reading that her blood pressures were high.  When EMS got there they were high.  Patient denies any chest pain or shortness of breath.  But stated she thought maybe that her heart was doing some jumping around.  Denies any pain over the last few days.  Patient very hard of hearing.  Past medical history is significant for atrial fibrillation presence of permanent cardiac pacemaker tachycardia-bradycardia syndrome.  Hyperlipidemia.  [name] syndrome.  Coronary artery disease.  With stents x2 to RCA in 2010.  Patient's blood pressure definitely high here.  We will pressure 127/120.  With patient denies skipping or not taking her blood pressure medicine.  Also patient denies any headache or strokelike symptoms.      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[place])    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    Essential hypertension    GERD (gastroesophageal reflux disease)    H/O hiatal hernia    Headache(784.0)    HOH (hard of hearing)    Hyperlipidemia    Neuromuscular disorder (HCC)    Tremors   Pericardial effusion    Hemorrhagic    Presence of permanent cardiac pacemaker    Tachycardia-bradycardia syndrome ([place])    s/p Medtronic Adapta L dual chamber device  5/10    Patient Active Problem List   Diagnosis Date Noted   Hypertensive crisis 03/19/2021   Elevated troponin 03/19/2021   Prolonged QT interval 03/19/2021   Esophageal dysphagia 07/03/2020   Nonspecific chest pain 09/15/2016   Complete  rotator cuff tear of left shoulder    Anemia 10/31/2014   H/O heart artery stent 10/30/2014   Chest pain with moderate risk for cardiac etiology [id number]   Diastolic dysfunction with chronic heart failure ([place]) 08/23/2014   Chronic diastolic heart failure ([place]) 08/23/2014   Acute chest pain 08/23/2014   Acquired obstruction of both nasolacrimal ducts 06/15/2014   PERICARDIAL EFFUSION 07/10/2009   Disease of pericardium 07/10/2009   Essential hypertension, benign 04/18/2009   CAD (coronary artery disease), native coronary artery 04/18/2009   Benign essential hypertension 04/18/2009   PPM-Medtronic 04/04/2009   BRADYCARDIA-TACHYCARDIA SYNDROME 01/26/2009   HLD (hyperlipidemia) 12/19/2008   Anxiety 12/19/2008   Atrial fibrillation ([place]) 12/19/2008   GERD 12/19/2008   Abnormal involuntary movements 12/19/2008   Afib ([place]) 12/19/2008   Familial multiple lipoprotein-type hyperlipidemia 12/19/2008    Past Surgical History:  Procedure Laterality Date   APPENDECTOMY     CARDIAC CATHETERIZATION  2010   stents x2.   CATARACT EXTRACTION     CHOLECYSTECTOMY     COLONOSCOPY  2011   COLONOSCOPY N/A 10/19/2014   Procedure: COLONOSCOPY;  Surgeon: Rogene Houston, MD;  Location: [facility] ENDO SUITE;  Service: Endoscopy;  Laterality: N/A;  1030   ESOPHAGEAL DILATION N/A 05/02/2020   Procedure: ESOPHAGEAL DILATION;  Surgeon: Rogene Houston, MD;  Location: [facility] ENDO SUITE;  Service: Gastroenterology;  Laterality: N/A;  ESOPHAGOGASTRODUODENOSCOPY N/A 10/26/2014   Procedure: ESOPHAGOGASTRODUODENOSCOPY (EGD);  Surgeon: Rogene Houston, MD;  Location: [facility] ENDO SU[facility];  Service: Endoscopy;  Laterality: N/A;  14 - Dr. has lunch and learn   ESOPHAGOGASTRODUODENOSCOPY (EGD) WITH PROPOFOL N/A 05/02/2020   Procedure: ESOPHAGOGASTRODUODENOSCOPY (EGD) WITH PROPOFOL;  Surgeon: Rogene Houston, MD;  Location: [facility] ENDO SU[facility];  Service: Gastroenterology;  Laterality: N/A;  250   Esophagogastroduodenoscopy with esophageal  dilation  2004, 2006, 2007   GIVENS CAPSULE STUDY N/A 10/31/2014   Procedure: GIVENS CAPSULE STUDY;  Surgeon: Rogene Houston, MD;  Location: [facility] ENDO SU[facility];  Service: Endoscopy;  Laterality: N/A;  730 -- pacemaker--needs monitoring--outpatient bed   INSERT / REPLACE / REMOVE PACEMAKER  2010   LEFT HEART CATHETERIZATION WITH CORONARY ANGIOGRAM N/A 11/17/2011   Procedure: LEFT HEART CATHETERIZATION WITH CORONARY ANGIOGRAM;  Surgeon: Sherren Mocha, MD;  Location: [facility] CATH LAB;  Service: Cardiovascular;  Laterality: N/A;   LEFT HEART CATHETERIZATION WITH CORONARY ANGIOGRAM N/A 09/26/2014   Procedure: LEFT HEART CATHETERIZATION WITH CORONARY ANGIOGRAM;  Surgeon: Leonie Man, MD;  Location: [facility] C[facility] LAB;  Service: Cardiovascular;  Laterality: N/A;   Right rotator cuff repair     SHOULDER ACROMIOPLASTY Right 05/30/2015   Procedure: RIGHT SHOULDER ACROMIOPLASTY;  Surgeon: Carole Civil, MD;  Location: [facility] ORS;  Service: Orthopedics;  Laterality: Right;   SHOULDER OPEN ROTATOR CUFF REPAIR Right 05/30/2015   Procedure: OPEN ROTATOR CUFF REPAIR RIGHT SHOULDER;  Surgeon: Carole Civil, MD;  Location: [facility] ORS;  Service: Orthopedics;  Laterality: Right;   SHOULDER OPEN ROTATOR CUFF REPAIR Left 10/22/2016   Procedure: ROTATOR CUFF REPAIR SHOULDER OPEN;  Surgeon: Carole Civil, MD;  Location: [facility] ORS;  Service: Orthopedics;  Laterality: Left;   Subxiphoid pericardial window  11/10   VAGINAL HYSTERECTOMY       OB History     Gravida  6   Para  5   Term  5   Preterm      AB  1   Living  3      SAB  1   IAB      Ectopic      Multiple      Live Births              Family History  Problem Relation Age of Onset   Cancer Mother        Colon    Coronary artery disease Sister    Coronary artery disease Brother    Arthritis Other    Lung disease Other    Asthma Other     Social History   Tobacco Use   Smoking status: Former    Packs/day: 2.00    Years: 10.00     Pack years: 20.00    Types: Cigarettes    Quit date: 05/23/1989    Years since quitting: 31.8   Smokeless tobacco: Never  Vaping Use   Vaping Use: Never used  Substance Use Topics   Alcohol use: No    Alcohol/week: 0.0 standard drinks   Drug use: No    Home Medications Prior to Admission medications   Medication Sig Start Date End Date Taking? Authorizing Provider  ALPRAZolam Duanne Moron) 1 MG tablet Take 1 mg by mouth 3 (three) times daily as needed. 01/02/21  Yes [provider]  amiodarone (PACERONE) 200 MG tablet Take 200 mg Daily Monday - Saturday None on Sunday 07/06/20  Yes Evans Lance, MD  aspirin EC 81 MG tablet  Take 1 tablet (81 mg total) by mouth daily. Swallow whole. 07/06/20  Yes Evans Lance, MD  Cyanocobalamin (VITAMIN B-12) 5000 MCG SUBL Take 5,000 mcg by mouth daily.   Yes [provider]  folic acid (FOLVITE) A999333 MCG tablet Take 400 mcg by mouth daily.   Yes [provider]  furosemide (LASIX) 40 MG tablet Take 40 mg by mouth See admin instructions. Take 1 tablet (40 mg) by mouth (scheduled) in the morning, may take an additional dose if needed for swelling. 12/04/11  Yes Satira Sark, MD  irbesartan (AVAPRO) 150 MG tablet TAKE (1) TABLET BY MOUTH ONCE DAILY. 10/04/20  Yes Satira Sark, MD  JARDIANCE 10 MG TABS tablet Take 10 mg by mouth daily. 02/07/21  Yes [provider]  metoprolol succinate (TOPROL-XL) 25 MG 24 hr tablet Take 3 tablets (75 mg total) by mouth in the morning. & 50 mg in the evening 05/30/20  Yes Satira Sark, MD  Multiple Vitamin (MULTIVITAMIN WITH MINERALS) TABS tablet Take 1 tablet by mouth daily.    Yes [provider]  nitroGLYCERIN (NITROSTAT) 0.4 MG SL tablet Place 0.4 mg under the tongue every 5 (five) minutes x 3 doses as needed for chest pain.    Yes [provider]  omeprazole (PRILOSEC) 40 MG capsule Take 1 capsule (40 mg total) by mouth daily before breakfast. 07/03/20  Yes  Rehman, Mechele Dawley, MD  ondansetron (ZOFRAN) 4 MG tablet Take 4 mg by mouth every 6 (six) hours as needed for nausea or vomiting.  03/12/20  Yes [provider]  potassium chloride SA (K-DUR,KLOR-CON) 20 MEQ tablet Take 20 mEq by mouth See admin instructions. Takes 1 tablet (20 meq) by mouth daily (scheduled) & a 2nd tablet only if has to take a 2nd dose of Lasix   Yes Rexene Alberts, MD  primidone (MYSOLINE) 50 MG tablet Take 50 mg by mouth at bedtime. 10/31/10  Yes [provider]  PROAIR HFA 108 (90 Base) MCG/ACT inhaler Inhale 1 puff into the lungs every 4 (four) hours as needed for wheezing or shortness of breath.  11/10/18  Yes [provider]  VITAMIN D, CHOLECALCIFEROL, PO Take 1 tablet by mouth daily.   Yes [provider]  clobetasol cream (TEMOVATE) [id number] % Apply 1 application topically 2 (two) times daily. Patient not taking: No sig reported 08/07/20   Faustino Congress, NP  diclofenac Sodium (VOLTAREN) 1 % GEL Apply 2-4 g topically every 6 (six) hours as needed (leg/knee pain.).  Patient not taking: Reported on 03/19/2021 04/02/20   [provider]  LORazepam (ATIVAN) 0.5 MG tablet Take 0.5 mg by mouth 2 (two) times daily.  Patient not taking: No sig reported 05/22/20   [provider]  rosuvastatin (CRESTOR) 20 MG tablet Take 1 tablet (20 mg total) by mouth at bedtime. Patient not taking: No sig reported 10/04/20   Satira Sark, MD  triamcinolone (KENALOG) 0.025 % cream SMARTSIG:Sparingly Topical Twice Daily Patient not taking: No sig reported 07/04/20   [provider]    Allergies    Amitriptyline hcl and Sulfonamide derivatives  Review of Systems   Review of Systems  Constitutional:  Negative for chills and fever.  HENT:  Positive for hearing loss. Negative for ear pain and sore throat.   Eyes:  Negative for pain and visual disturbance.  Respiratory:  Negative for cough and shortness of breath.   Cardiovascular:   Negative for chest pain and palpitations.  Gastrointestinal:  Negative for abdominal pain and vomiting.  Genitourinary:  Negative for dysuria and hematuria.  Musculoskeletal:  Negative for arthralgias and back pain.  Skin:  Negative for color change and rash.  Neurological:  Negative for seizures, syncope and headaches.  All other systems reviewed and are negative.  Physical Exam Updated Vital Signs BP (!) 169/88   Pulse (!) 34   Temp 98.1 F (36.7 C) (Axillary)   Resp (!) 30   SpO2 97%   Physical Exam Vitals and nursing note reviewed.  Constitutional:      General: She is not in acute distress.    Appearance: Normal appearance. She is well-developed. She is not toxic-appearing.  HENT:     Head: Normocephalic and atraumatic.  Eyes:     Extraocular Movements: Extraocular movements intact.     Conjunctiva/sclera: Conjunctivae normal.     Pupils: Pupils are equal, round, and reactive to light.  Cardiovascular:     Rate and Rhythm: Normal rate and regular rhythm.     Heart sounds: No murmur heard. Pulmonary:     Effort: Pulmonary effort is normal. No respiratory distress.     Breath sounds: Normal breath sounds. No wheezing or rales.  Abdominal:     Palpations: Abdomen is soft.     Tenderness: There is no abdominal tenderness.  Musculoskeletal:        General: No swelling.     Cervical back: Neck supple.  Skin:    General: Skin is warm and dry.  Neurological:     General: No focal deficit present.     Mental Status: She is alert and oriented to person, place, and time.     Cranial Nerves: No cranial nerve deficit.     Sensory: No sensory deficit.     Motor: No weakness.    ED Results / Procedures / Treatments   Labs (all labs ordered are listed, but only abnormal results are displayed) Labs Reviewed  COMPREHENSIVE METABOLIC PANEL - Abnormal; Notable for the following components:      Result Value   Potassium 3.4 (*)    Glucose, Bld 103 (*)    Anion gap 4 (*)     All other components within normal limits  BRAIN NATRIURETIC PEPTIDE - Abnormal; Notable for the following components:   B Natriuretic Peptide 295.0 (*)    All other components within normal limits  TROPONIN I (HIGH SENSITIVITY) - Abnormal; Notable for the following components:   Troponin I (High Sensitivity) 2,030 (*)    All other components within normal limits  RESP PANEL BY RT-PCR (FLU A&B, COVID) ARPGX2  CBC WITH DIFFERENTIAL/PLATELET  HEPARIN LEVEL (UNFRACTIONATED)  CBC  BASIC METABOLIC PANEL  MAGNESIUM  TROPONIN I (HIGH SENSITIVITY)    EKG EKG Interpretation  Date/Time:  Tuesday March 19 2021 18:58:30 EDT Ventricular Rate:  69 PR Interval:  187 QRS Duration: 92 QT Interval:  479 QTC Calculation: 514 R Axis:   43 Text Interpretation: Sinus rhythm Minimal ST elevation, anterior leads Prolonged QT interval Questionable atrial paced rhythm Confirmed by Fredia Sorrow [phone]) on 03/19/2021 8:20:18 PM  Radiology DG Chest 2 View  Result Date: 03/19/2021 CLINICAL DATA:  Shortness of breath EXAM: CHEST - 2 VIEW COMPARISON:  05/25/2017 FINDINGS: Increased interstitial markings. No focal consolidation or frank interstitial edema. No pleural effusion or pneumothorax. Cardiomegaly.  Left subclavian pacemaker. Visualized osseous structures are within normal limits. IMPRESSION: Cardiomegaly.  No frank interstitial edema. Electronically Signed   By: Henderson Newcomer.D.  On: 03/19/2021 21:47    Procedures Procedures   Medications Ordered in ED Medications  heparin ADULT infusion 100 units/mL (25000 units/228m) (750 Units/hr Intravenous Infusion Verify 03/19/21 2347)  nitroGLYCERIN 50 mg in dextrose 5 % 250 mL (0.2 mg/mL) infusion (20 mcg/min Intravenous New Bag/Given 03/19/21 2346)  aspirin EC tablet 81 mg (has no administration in time range)  amiodarone (PACERONE) tablet 200 mg (has no administration in time range)  irbesartan (AVAPRO) tablet 150 mg (has no administration in  time range)  metoprolol succinate (TOPROL-XL) 24 hr tablet 75 mg (has no administration in time range)  rosuvastatin (CRESTOR) tablet 20 mg (has no administration in time range)  ALPRAZolam (XANAX) tablet 1 mg (has no administration in time range)  pantoprazole (PROTONIX) EC tablet 40 mg (has no administration in time range)  primidone (MYSOLINE) tablet 50 mg (has no administration in time range)  acetaminophen (TYLENOL) tablet 650 mg (has no administration in time range)  oxyCODONE (Oxy IR/ROXICODONE) immediate release tablet 5 mg (has no administration in time range)  potassium chloride SA (KLOR-CON) CR tablet 20 mEq (has no administration in time range)  potassium chloride 10 mEq in 100 mL IVPB (has no administration in time range)  metoprolol succinate (TOPROL-XL) 24 hr tablet 50 mg (has no administration in time range)  heparin injection 3,000 Units (3,000 Units Intravenous Given 03/19/21 2228)  aspirin tablet 325 mg (325 mg Oral Given 03/19/21 2257)  LORazepam (ATIVAN) injection 1 mg (1 mg Intravenous Given 03/19/21 2257)  metoprolol succinate (TOPROL-XL) 24 hr tablet 50 mg (50 mg Oral Given 03/19/21 2322)  iohexol (OMNIPAQUE) 350 MG/ML injection 100 mL (100 mLs Intravenous Contrast Given 03/20/21 0005)    ED Course  I have reviewed the triage vital signs and the nursing notes.  Pertinent labs & imaging results that were available during my care of the patient were reviewed by me and considered in my medical decision making (see chart for details).    MDM Rules/Calculators/A&P                         CRITICAL CARE Performed by: SFredia SorrowTotal critical care time: 60 minutes Critical care time was exclusive of separately billable procedures and treating other patients. Critical care was necessary to treat or prevent imminent or life-threatening deterioration. Critical care was time spent personally by me on the following activities: development of treatment plan with patient  and/or surrogate as well as nursing, discussions with consultants, evaluation of patient's response to treatment, examination of patient, obtaining history from patient or surrogate, ordering and performing treatments and interventions, ordering and review of laboratory studies, ordering and review of radiographic studies, pulse oximetry and re-evaluation of patient's condition.  Patient's work-up significant for troponin of 2030.  Patient's BNP up some at 295.  Chest x-ray without acute findings.  EKG without any acute cardiac findings and no evidence of a STEMI.  But clearly patient had a cardiac event in the past few days.  [facility] on [facility]  We will start heparin and we will start nitroglycerin drip.  To control the blood pressure.  They recommending that medicine do the admission at [facility] will contact the hospitalist here today to arrange that.  COVID testing added on.  Patient still without any complaints of any chest pain.  Final Clinical Impression(s) / ED Diagnoses Final diagnoses:  Non-STEMI (non-ST elevated myocardial infarction) [place]  Hypertensive urgency    Rx /  DC Orders ED Discharge Orders     None        Fredia Sorrow, MD 03/20/21 [id number]

## 2021-03-19 NOTE — Progress Notes (Signed)
ANTICOAGULATION CONSULT NOTE - Initial Consult  Pharmacy Consult for Heparin  Indication: chest pain/ACS  Allergies  Allergen Reactions   Amitriptyline Hcl Other (See Comments)    Caused "jaws to twist and lock"   Sulfonamide Derivatives Other (See Comments)    UNKNOWN REACTION    Vital Signs: Temp: 98.1 F (36.7 C) (08/16 1855) Temp Source: Axillary (08/16 1855) BP: 209/111 (08/16 2230) Pulse Rate: 55 (08/16 2230)  Labs: Recent Labs    03/19/21 2050  HGB 13.0  HCT 40.3  PLT 216  CREATININE 0.64  TROPONINIHS 2,030*    CrCl cannot be calculated (Unknown ideal weight.).   Medical History: 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[place])    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    Essential hypertension    GERD (gastroesophageal reflux disease)    H/O hiatal hernia    Headache(784.0)    HOH (hard of hearing)    Hyperlipidemia    Neuromuscular disorder (HCC)    Tremors   Pericardial effusion    Hemorrhagic    Presence of permanent cardiac pacemaker    Tachycardia-bradycardia syndrome (HCC)    s/p Medtronic Adapta L dual chamber device  5/10   Assessment: 85 y/o F with elevated troponin. Consulted to start heparin. Pt does have a history of afib but has not been on any anti-coagulation. CBC/renal function good. PTA meds reviewed.   Goal of Therapy:  Heparin level 0.3-0.7 units/ml Monitor platelets by anticoagulation protocol: Yes   Plan:  Heparin 3000 units BOLUS Start heparin drip at 750 units/hr 0700 Heparin level Daily CBC/Heparin level Monitor for bleeding  Narda Bonds, PharmD, BCPS Clinical Pharmacist Phone: [phone]

## 2021-03-19 NOTE — ED Notes (Signed)
Critical Result: Troponin 2330 Reported to Dr. Rogene Houston by Marsh Dolly. Donalda Ewings, RN.

## 2021-03-19 NOTE — H&P (Addendum)
History and Physical    [name] [id number] DOB: [birth year] DOA: 03/19/2021  PCP: Celene Squibb, MD   Patient coming from: Home   Chief Complaint: High BP   HPI: [name] is a 85 y.o. female with medical history significant for atrial fibrillation not anticoagulated, coronary artery disease with stent to RCA in 2010, sinus node dysfunction with pacer, chronic diastolic CHF, anxiety, and hypertension, now presented to the emergency department for evaluation of high blood pressure.  History is somewhat difficult to obtain.  Patient reportedly called EMS out for high blood pressure.  She reports checking her blood pressure every day and does not indicate that anything in particular prompted her to check it now.  She does report new sharp pain in her back on admission that she states is unusual for her.  Her other numerous complaints seem to be more longstanding.  Patient lives alone but speaks with and is visited by family frequently.  She has had some intermittent chest pain on occasion for what sounds like months and not necessarily changed today.  She also has frequent nausea and takes antiemetics at home chronically, and while she reports fatigue and shortness of breath, it sounds like this is also been present for a month or more.  ED Course: Upon arrival to the ED, patient is found to be afebrile, saturating well on room air, and with systolic blood pressure as high as 239.  EKG was sinus rhythm, minimal ST elevations anteriorly, and QTC 514 ms.  Chest x-ray with cardiomegaly but no frank edema.  Chemistry panel with potassium 3.4.  CBC unremarkable.  Troponin 2030.  Cardiology was consulted by the ED physician and medical admission to [facility] was recommended.  Patient was given 3 and 25 mg of aspirin and started on IV heparin and IV nitroglycerin in the ED.  Review of Systems:  All other systems reviewed and apart from HPI, are negative.  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   CATARACT EXTRACTION     CHOLECYSTECTOMY     COLONOSCOPY  2011   COLONOSCOPY N/A 10/19/2014   Procedure: COLONOSCOPY;  Surgeon: Rogene Houston, MD;  [facility];  Service: Endoscopy;  Laterality: N/A;  1030   ESOPHAGEAL DILATION N/A 05/02/2020   Procedure: ESOPHAGEAL DILATION;  Surgeon: Rogene Houston, MD;  Location: [facility];  Service: Gastroenterology;  Laterality: N/A;   ESOPHAGOGASTRODUODENOSCOPY N/A 10/26/2014   Procedure: ESOPHAGOGASTRODUODENOSCOPY (EGD);  Surgeon: Rogene Houston, MD;  [facility]: [facility];  Service: Endoscopy;  Laterality: N/A;  31 - Dr. has lunch and learn   ESOPHAGOGASTRODUODENOSCOPY (EGD) WITH PROPOFOL N/A 05/02/2020   Procedure: ESOPHAGOGASTRODUODENOSCOPY (EGD) WITH PROPOFOL;  Surgeon: Rogene Houston, MD;  [facility];  Service: Gastroenterology;  Laterality: N/A;  250   Esophagogastroduodenoscopy with esophageal dilation  2004, 2006, 2007   GIVENS CAPSULE STUDY N/A 10/31/2014   Procedure: GIVENS CAPSULE STUDY;  Surgeon: Rogene Houston, MD;  Location: [facility];  Service: Endoscopy;  Laterality: N/A;  730 -- pacemaker--needs monitoring--outpatient bed   INSERT / REPLACE / REMOVE  PACEMAKER  2010   LEFT HEART CATHETERIZATION WITH CORONARY ANGIOGRAM N/A 11/17/2011   Procedure: LEFT HEART CATHETERIZATION WITH CORONARY ANGIOGRAM;  Surgeon: Sherren Mocha, MD;  Location: [facility] CATH LAB;  Service: Cardiovascular;  Laterality: N/A;   LEFT HEART CATHETERIZATION WITH CORONARY ANGIOGRAM N/A 09/26/2014   Procedure: LEFT HEART CATHETERIZATION WITH CORONARY ANGIOGRAM;  Surgeon: Leonie Man, MD;  Location: [facility] CATH LAB;  Service: Cardiovascular;  Laterality: N/A;   Right rotator cuff repair     SHOULDER ACROMIOPLASTY Right 05/30/2015   Procedure: RIGHT SHOULDER ACROMIOPLASTY;  Surgeon: Carole Civil, MD;  Location: [facility] ORS;  Service: Orthopedics;  Laterality: Right;   SHOULDER OPEN ROTATOR CUFF REPAIR Right 05/30/2015   Procedure: OPEN ROTATOR CUFF REPAIR RIGHT SHOULDER;  Surgeon: Carole Civil, MD;  Location: [facility] OR[facility];  Service: Orthopedics;  Laterality: Right;   SHOULDER OPEN ROTATOR CUFF REPAIR Left 10/22/2016   Procedure: ROTATOR CUFF REPAIR SHOULDER OPEN;  Surgeon: Carole Civil, MD;  Location: [facility] OR[facility];  Service: Orthopedics;  Laterality: Left;   Subxiphoid pericardial window  11/10   VAGINAL HYSTERECTOMY      Social History:   reports that she quit smoking about 31 years ago. Her smoking use included cigarettes. She has a 20.00 pack-year smoking history. She has never used smokeless tobacco. She reports that she does not drink alcohol and does not use drugs.  Allergies  Allergen Reactions   Amitriptyline Hcl Other (See Comments)    Caused "jaws to twist and lock"   Sulfonamide Derivatives Other (See Comments)    UNKNOWN REACTION    Family History  Problem Relation Age of Onset   Cancer Mother        Colon    Coronary artery disease Sister    Coronary artery disease Brother    Arthritis Other    Lung disease Other    Asthma Other      Prior to Admission medications   Medication Sig Start Date End Date Taking? Authorizing Provider  ALPRAZolam Duanne Moron) 1  MG tablet Take 1 mg by mouth 3 (three) times daily as needed. 01/02/21  Yes [provider]  amiodarone (PACERONE) 200 MG tablet Take 200 mg Daily Monday - Saturday None on Sunday 07/06/20  Yes Evans Lance, MD  aspirin EC 81 MG tablet Take 1 tablet (81 mg total) by mouth daily. Swallow whole. 07/06/20  Yes Evans Lance, MD  Cyanocobalamin (VITAMIN B-12) 5000 MCG SUBL Take 5,000 mcg by mouth daily.   Yes [provider]  folic acid (FOLVITE) [id number] MCG tablet Take 400 mcg by mouth daily.   Yes [provider]  furosemide (LASIX) 40 MG tablet Take 40 mg by mouth See admin instructions. Take 1 tablet (40 mg) by mouth (scheduled) in the morning, may take an additional dose if needed for swelling. 12/04/11  Yes Satira Sark, MD  irbesartan (AVAPRO) 150 MG tablet TAKE (1) TABLET BY MOUTH ONCE  DAILY. 10/04/20  Yes Satira Sark, MD  JARDIANCE 10 MG TABS tablet Take 10 mg by mouth daily. 02/07/21  Yes [provider]  metoprolol succinate (TOPROL-XL) 25 MG 24 hr tablet Take 3 tablets (75 mg total) by mouth in the morning. & 50 mg in the evening 05/30/20  Yes Satira Sark, MD  Multiple Vitamin (MULTIVITAMIN WITH MINERALS) TABS tablet Take 1 tablet by mouth daily.    Yes [provider]  nitroGLYCERIN (NITROSTAT) 0.4 MG SL tablet Place 0.4 mg under the tongue every 5 (five) minutes x 3 doses as needed for chest pain.    Yes [provider]  omeprazole (PRILOSEC) 40 MG capsule Take 1 capsule (40 mg total) by mouth daily before breakfast. 07/03/20  Yes Rehman, Mechele Dawley, MD  ondansetron (ZOFRAN) 4 MG tablet Take 4 mg by mouth every 6 (six) hours as needed for nausea or vomiting.  03/12/20  Yes [provider]  potassium chloride SA (K-DUR,KLOR-CON) 20 MEQ tablet Take 20 mEq by mouth See admin instructions. Takes 1 tablet (20 meq) by mouth daily (scheduled) & a 2nd tablet only if has to take a 2nd dose of Lasix   Yes Rexene Alberts, MD   primidone (MYSOLINE) 50 MG tablet Take 50 mg by mouth at bedtime. 10/31/10  Yes [provider]  PROAIR HFA 108 (90 Base) MCG/ACT inhaler Inhale 1 puff into the lungs every 4 (four) hours as needed for wheezing or shortness of breath.  11/10/18  Yes [provider]  VITAMIN D, CHOLECALCIFEROL, PO Take 1 tablet by mouth daily.   Yes [provider]  clobetasol cream (TEMOVATE) [id number] % Apply 1 application topically 2 (two) times daily. Patient not taking: No sig reported 08/07/20   Faustino Congress, NP  diclofenac Sodium (VOLTAREN) 1 % GEL Apply 2-4 g topically every 6 (six) hours as needed (leg/knee pain.).  Patient not taking: Reported on 03/19/2021 04/02/20   [provider]  LORazepam (ATIVAN) 0.5 MG tablet Take 0.5 mg by mouth 2 (two) times daily.  Patient not taking: No sig reported 05/22/20   [provider]  rosuvastatin (CRESTOR) 20 MG tablet Take 1 tablet (20 mg total) by mouth at bedtime. Patient not taking: No sig reported 10/04/20   Satira Sark, MD  triamcinolone (KENALOG) 0.025 % cream SMARTSIG:Sparingly Topical Twice Daily Patient not taking: No sig reported 07/04/20   [provider]    Physical Exam: Vitals:   03/19/21 2305 03/19/21 2310 03/19/21 2315 03/19/21 2320  BP: (!) 217/139 (!) 175/92 (!) 173/102 (!) 202/98  Pulse: 75 71 71 (!) 36  Resp: (!) 22 (!) 29 (!) 21 (!) 32  Temp:      TempSrc:      SpO2: 97% 100% 100% 99%    Constitutional: NAD, calm  Eyes: PERTLA, lids and conjunctivae normal ENMT: Mucous membranes are moist. Posterior pharynx clear of any exudate or lesions.   Neck: supple, no masses  Respiratory: clear to auscultation bilaterally, no wheezing, no crackles.   Cardiovascular: S1 & S2 heard, regular rate and rhythm. No extremity edema.   Abdomen: No distension, soft. Bowel sounds active.  Musculoskeletal: no clubbing / cyanosis. No joint deformity upper and lower extremities.   Skin: no  significant rashes, lesions, ulcers. Warm, dry, well-perfused. Neurologic: CN 2-12 grossly intact. Moving all extremities. Resting tremor.  Psychiatric: Alert and oriented to person, place, and situation. Pleasant and cooperative.    Labs and Imaging on Admission: I have  personally reviewed following labs and imaging studies  CBC: Recent Labs  Lab 03/19/21 2050  WBC 8.6  NEUTROABS 5.4  HGB 13.0  HCT 40.3  MCV 96.6  PLT 123XX123   Basic Metabolic Panel: Recent Labs  Lab 03/19/21 2050  NA 138  K 3.4*  CL 107  CO2 27  GLUCOSE 103*  BUN 11  CREATININE 0.64  CALCIUM 8.9   GFR: CrCl cannot be calculated (Unknown ideal weight.). Liver Function Tests: Recent Labs  Lab 03/19/21 2050  AST 28  ALT 20  ALKPHOS 85  BILITOT 0.4  PROT 8.1  ALBUMIN 3.9   No results for input(s): LIPASE, AMYLASE in the last 168 hours. No results for input(s): AMMONIA in the last 168 hours. Coagulation Profile: No results for input(s): INR, PROTIME in the last 168 hours. Cardiac Enzymes: No results for input(s): CKTOTAL, CKMB, CKMBINDEX, TROPONINI in the last 168 hours. BNP (last 3 results) No results for input(s): PROBNP in the last 8760 hours. HbA1C: No results for input(s): HGBA1C in the last 72 hours. CBG: No results for input(s): GLUCAP in the last 168 hours. Lipid Profile: No results for input(s): CHOL, HDL, LDLCALC, TRIG, CHOLHDL, LDLDIRECT in the last 72 hours. Thyroid Function Tests: No results for input(s): TSH, T4TOTAL, FREET4, T3FREE, THYROIDAB in the last 72 hours. Anemia Panel: No results for input(s): VITAMINB12, FOLATE, FERRITIN, TIBC, IRON, RETICCTPCT in the last 72 hours. Urine analysis:    Component Value Date/Time   COLORURINE YELLOW 12/07/2009 1532   APPEARANCEUR CLEAR 12/07/2009 1532   LABSPEC 1.010 12/07/2009 1532   PHURINE 6.5 12/07/2009 1532   GLUCOSEU NEGATIVE 12/07/2009 1532   HGBUR NEGATIVE 12/07/2009 1532   BILIRUBINUR NEGATIVE 12/07/2009 1532   KETONESUR  NEGATIVE 12/07/2009 1532   PROTEINUR NEGATIVE 12/07/2009 1532   UROBILINOGEN 0.2 12/07/2009 1532   NITRITE NEGATIVE 12/07/2009 1532   LEUKOCYTESUR  12/07/2009 1532    NEGATIVE MICROSCOPIC NOT DONE ON URINES WITH NEGATIVE PROTEIN, BLOOD, LEUKOCYTES, NITRITE, OR GLUCOSE <1000 mg/dL.   Sepsis Labs: '@LABRCNTIP'$ (procalcitonin:4,lacticidven:4) )No results found for this or any previous visit (from the past 240 hour(s)).   Radiological Exams on Admission: DG Chest 2 View  Result Date: 03/19/2021 CLINICAL DATA:  Shortness of breath EXAM: CHEST - 2 VIEW COMPARISON:  05/25/2017 FINDINGS: Increased interstitial markings. No focal consolidation or frank interstitial edema. No pleural effusion or pneumothorax. Cardiomegaly.  Left subclavian pacemaker. Visualized osseous structures are within normal limits. IMPRESSION: Cardiomegaly.  No frank interstitial edema. Electronically Signed   By: Julian Hy M.D.   On: 03/19/2021 21:47    EKG: Independently reviewed. Sinus rhythm, minimal anterior ST elevations, QTc 514 ms.   Assessment/Plan   1. Hypertensive crisis; Elevated troponin  - Presents for eval of high BP at home, SBP as high as 239 in ED  - Hx difficult, reports new back pain today, intermittent chest discomfort and frequent nausea seem chronic and unchanged, more fatigued for ~1 month  - Initial troponin 2030, ED discussed with cards at [facility], no STEMI on EKG  - She was given ASA and IV heparin and IV NTG started in ED  - With new back pain and severe HTN, plan to check CTA, NSTEMI or demand ischemia in setting of severe HTN also on Ddx, give Toprol now, continue IV NTG titration for BP-control, continue ASA, statin, and beta blocker  ADDENDUM:  No aortic aneurysm or dissection on CTA, troponin decreasing. Likely demand ischemia in setting of severe HTN or possibly NSTEMI. Plan to  continue cardiac monitoring, BP control with NTG gtt, continue ASA, beta-blocker, and statin, check echo   2.  Chronic diastolic CHF  - Appears euvolemic on admission  - EF preserved on TTE in 2018   3. Paroxysmal atrial fibrillation  - Very low AF burden on device interrogation, not anticoagulated prior to admission  - Continue amiodarone and Toprol as tolerated    4. Sinus node dysfunction  - Pacer function functioning normally on recent device check per cards notes    5. Anxiety  - Continue as-needed Xanax    6. Hypokalemia  - Replace, repeat chemistries in am   7. Prolonged QT interval  - QTc 514 on admission  - Replace potassium, continue cardiac monitoring, repeat EKG in am, and minimize QT-prolonging medications   8. Pancreatic lesion  - Noted incidentally on CT in ED  - Outpatient follow-up with MRI recommended, discussed with patient's daughter    DVT prophylaxis: IV heparin  Code Status: Full for now, patient deferred the decision to her daughter who was reached by phone and does not think the patient would want CPR but wants to talk to her mother before saying DNR  Level of Care: Level of care: Progressive Family Communication: Niece updated at bedside, daughter by phone  Disposition Plan:  Patient is from: Home  Anticipated d/c is to: TBD Anticipated d/c date is: 03/21/21  Patient currently: Pending CTA, and if negative repeat troponin, BP-control, likely echo  Consults called: Cardiology consulted by ED  Admission status: Inpatient     Vianne Bulls, MD [facility]  03/19/2021, 11:28 PM

## 2021-03-20 ENCOUNTER — Inpatient Hospital Stay (HOSPITAL_COMMUNITY): Payer: Medicare Other

## 2021-03-20 DIAGNOSIS — R079 Chest pain, unspecified: Secondary | ICD-10-CM

## 2021-03-20 DIAGNOSIS — I2510 Atherosclerotic heart disease of native coronary artery without angina pectoris: Secondary | ICD-10-CM

## 2021-03-20 DIAGNOSIS — I5032 Chronic diastolic (congestive) heart failure: Secondary | ICD-10-CM

## 2021-03-20 DIAGNOSIS — I160 Hypertensive urgency: Secondary | ICD-10-CM

## 2021-03-20 DIAGNOSIS — K8689 Other specified diseases of pancreas: Secondary | ICD-10-CM | POA: Diagnosis present

## 2021-03-20 DIAGNOSIS — K869 Disease of pancreas, unspecified: Secondary | ICD-10-CM | POA: Diagnosis present

## 2021-03-20 DIAGNOSIS — I214 Non-ST elevation (NSTEMI) myocardial infarction: Principal | ICD-10-CM

## 2021-03-20 DIAGNOSIS — I480 Paroxysmal atrial fibrillation: Secondary | ICD-10-CM

## 2021-03-20 LAB — CBC
HCT: 38 % (ref 36.0–46.0)
Hemoglobin: 12 g/dL (ref 12.0–15.0)
MCH: 30.5 pg (ref 26.0–34.0)
MCHC: 31.6 g/dL (ref 30.0–36.0)
MCV: 96.7 fL (ref 80.0–100.0)
Platelets: 200 10*3/uL (ref 150–400)
RBC: 3.93 MIL/uL (ref 3.87–5.11)
RDW: 13.7 % (ref 11.5–15.5)
WBC: 7.9 10*3/uL (ref 4.0–10.5)
nRBC: 0 % (ref 0.0–0.2)

## 2021-03-20 LAB — ECHOCARDIOGRAM COMPLETE
AR max vel: 2.79 cm2
AV Area VTI: 2.49 cm2
AV Area mean vel: 2.39 cm2
AV Mean grad: 3.5 mmHg
AV Peak grad: 6.1 mmHg
Ao pk vel: 1.24 m/s
Area-P 1/2: 3.08 cm2
Calc EF: 58.3 %
P 1/2 time: 582 msec
S' Lateral: 2.7 cm
Single Plane A2C EF: 53.5 %
Single Plane A4C EF: 59.5 %

## 2021-03-20 LAB — MAGNESIUM
Magnesium: 2.3 mg/dL (ref 1.7–2.4)
Magnesium: 2.2 mg/dL (ref 1.7–2.4)

## 2021-03-20 LAB — HEMOGLOBIN A1C
Hgb A1c MFr Bld: 5.4 % (ref 4.8–5.6)
Mean Plasma Glucose: 108.28 mg/dL

## 2021-03-20 LAB — BASIC METABOLIC PANEL
Anion gap: 9 (ref 5–15)
BUN: 12 mg/dL (ref 8–23)
CO2: 25 mmol/L (ref 22–32)
Calcium: 8.9 mg/dL (ref 8.9–10.3)
Chloride: 105 mmol/L (ref 98–111)
Creatinine, Ser: 0.65 mg/dL (ref 0.44–1.00)
GFR, Estimated: >60 mL/min (ref >60)
Glucose, Bld: 95 mg/dL (ref 70–99)
Potassium: 4.4 mmol/L (ref 3.5–5.1)
Sodium: 139 mmol/L (ref 135–145)

## 2021-03-20 LAB — LIPID PANEL
Cholesterol: 200 mg/dL (ref 0–200)
HDL: 49 mg/dL (ref >40)
LDL Cholesterol: 125 mg/dL — ABNORMAL HIGH (ref 0–99)
Total CHOL/HDL Ratio: 4.1 RATIO
Triglycerides: 130 mg/dL (ref <150)
VLDL: 26 mg/dL (ref 0–40)

## 2021-03-20 LAB — RESP PANEL BY RT-PCR (FLU A&B, COVID) ARPGX2
Influenza A by PCR: NEGATIVE
Influenza B by PCR: NEGATIVE
SARS Coronavirus 2 by RT PCR: NEGATIVE

## 2021-03-20 LAB — MRSA NEXT GEN BY PCR, NASAL: MRSA by PCR Next Gen: NOT DETECTED

## 2021-03-20 LAB — TROPONIN I (HIGH SENSITIVITY): Troponin I (High Sensitivity): 1869 ng/L (ref <18)

## 2021-03-20 LAB — HEPARIN LEVEL (UNFRACTIONATED)
Heparin Unfractionated: 0.18 IU/mL — ABNORMAL LOW (ref 0.30–0.70)
Heparin Unfractionated: 0.21 IU/mL — ABNORMAL LOW (ref 0.30–0.70)

## 2021-03-20 LAB — BRAIN NATRIURETIC PEPTIDE: B Natriuretic Peptide: 295 pg/mL — ABNORMAL HIGH (ref 0.0–100.0)

## 2021-03-20 MED ORDER — ISOSORB DINITRATE-HYDRALAZINE 20-37.5 MG PO TABS
1.0000 | ORAL_TABLET | Freq: Three times a day (TID) | ORAL | Status: DC
  Administered 2021-03-20 – 2021-03-21 (×5): 1 via ORAL
  Filled 2021-03-20 (×5): qty 1

## 2021-03-20 MED ORDER — EMPAGLIFLOZIN 10 MG PO TABS
10.0000 mg | ORAL_TABLET | Freq: Every day | ORAL | Status: DC
  Administered 2021-03-21 – 2021-03-22 (×2): 10 mg via ORAL
  Filled 2021-03-20: qty 1

## 2021-03-20 MED ORDER — HEPARIN BOLUS VIA INFUSION
1500.0000 [IU] | Freq: Once | INTRAVENOUS | Status: AC
  Administered 2021-03-20: 1500 [IU] via INTRAVENOUS

## 2021-03-20 MED ORDER — AMLODIPINE BESYLATE 10 MG PO TABS
10.0000 mg | ORAL_TABLET | Freq: Every day | ORAL | Status: DC
  Administered 2021-03-20 – 2021-03-22 (×3): 10 mg via ORAL
  Filled 2021-03-20 (×3): qty 1

## 2021-03-20 MED ORDER — HEPARIN BOLUS VIA INFUSION
1000.0000 [IU] | Freq: Once | INTRAVENOUS | Status: AC
  Administered 2021-03-20: 1000 [IU] via INTRAVENOUS
  Filled 2021-03-20: qty 1000

## 2021-03-20 NOTE — Progress Notes (Signed)
  Echocardiogram 2D Echocardiogram has been performed.  [name] 03/20/2021, 3:19 PM

## 2021-03-20 NOTE — Progress Notes (Signed)
PROGRESS NOTE    [name]  [id number] DOB: 01/01/1936 DOA: 03/19/2021 PCP: Celene Squibb, MD    Chief Complaint  Patient presents with   Hypertension    Brief Narrative:  CAD with DES to RCA in 2010, paroxysmal atrial fibrillation not on anticoagulation, tachybradycardia syndrome s/p [employer] PPM, chronic diastolic heart failure, HTN, HLD, anxiety, GERD, reports new sharp back pain, intermittent chest pain, found to have hypertension urgency and let elevated troponin, Transferred [facility] to [facility] due to elevated troponin Cardiology consulted, plan to cardiac cath in am  Subjective:  Blood pressure elevated, she denies chest pain, family at bedside  Assessment & Plan:   Principal Problem:   Hypertensive crisis Active Problems:   Anxiety   CAD (coronary artery disease), native coronary artery   Atrial fibrillation (HCC)   BRADYCARDIA-TACHYCARDIA SYNDROME   Chronic diastolic heart failure (HCC)   Elevated troponin   Prolonged QT interval   Pancreatic lesion   Non-STEMI, with history of CAD status post DES to RCA in 2010 -presents with back pain/chest pain -No aortic aneurysm or dissection on CTA -Currently on heparin drip, nitro drip, aspirin, Crestor, Toprol-XL -Echocardiogram pending -Cardiology consulted ,plan for cardiac cath tomorrow  Hypertension emergency Blood pressure improved on Norvasc, Avapro, BiDil, metoprolol XL -Continue adjust blood pressure medication as needed  Paroxysmal A. fib, history of tachybradycardia syndrome status post pacemaker -Sinus rhythm in the ED, currently paced rhythm,  -Patient refused anticoagulation in the past -Currently on metoprolol/amiodarone -Management per cardiology  Chronic diastolic CHF -Does not appear volume overloaded on presentation, echo pending -Home Lasix currently on hold -will follow cardiology recommendation  Hypokalemia Replaced and normalized  QTC prolongation Repeat EKG in the morning, keep K  above 4, mag above 2 Avoid QTC prolongation agent   Pancreatic lesion  - Noted incidentally on CT in ED  - Outpatient follow-up with MRI recommended, daughter was made aware        Unresulted Labs (From admission, onward)     Start     Ordered   03/22/21 0500  Heparin level (unfractionated)  Daily,   R     Question:  Specimen collection method  Answer:  Lab=Lab collect   03/20/21 1722   03/21/21 [id number]  Basic metabolic panel  Daily,   R      03/20/21 1305   03/21/21 0500  CBC  Daily,   R      03/20/21 1305   03/21/21 0200  Heparin level (unfractionated)  Once-Timed,   TIMED       Question:  Specimen collection method  Answer:  Lab=Lab collect   03/20/21 1722              DVT prophylaxis:   Currently on heparin drip   Code Status: Full Family Communication: Daughter at bedside Disposition:   Status is: Inpatient    Dispo: The patient is from: Home              Anticipated d/c is to: Home              Anticipated d/c date is: 1 to 2 days, need cardiac cath, need cardiology clearance                Consultants:  Cardiology  Procedures:  Plan for cardiac cath tomorrow  Antimicrobials:    None    Objective: Vitals:   03/20/21 0730 03/20/21 0801 03/20/21 0808 03/20/21 0913  BP: (!) 163/88 (!) 173/87 (!) 169/83 Marland Kitchen)  198/90  Pulse:  60 (!) 59   Resp: (!) 23 (!) '25 19 19  '$ Temp:    97.6 F (36.4 C)  TempSrc:    Oral  SpO2: 96% 99% 96% 97%    Intake/Output Summary (Last 24 hours) at 03/20/2021 1903 Last data filed at 03/20/2021 1806 Gross per 24 hour  Intake 419.22 ml  Output 1100 ml  Net -680.78 ml   There were no vitals filed for this visit.  Examination:  General exam: calm, NAD, very hard of hearing Respiratory system: Clear to auscultation. Respiratory effort normal. Cardiovascular system: S1 & S2 heard, RRR. No JVD, no murmur, No pedal edema. Gastrointestinal system: Abdomen is nondistended, soft and nontender.  Normal bowel sounds  heard. Central nervous system: Alert and oriented. No focal neurological deficits. Extremities: no edema Skin: No rashes, lesions or ulcers Psychiatry: Judgement and insight appear normal. Mood & affect appropriate.     Data Reviewed: I have personally reviewed following labs and imaging studies  CBC: Recent Labs  Lab 03/19/21 2050 03/20/21 0336  WBC 8.6 7.9  NEUTROABS 5.4  --   HGB 13.0 12.0  HCT 40.3 38.0  MCV 96.6 96.7  PLT 216 [id number]    Basic Metabolic Panel: Recent Labs  Lab 03/19/21 2050 03/19/21 2320 03/20/21 0336  NA 138  --  139  K 3.4*  --  4.4  CL 107  --  105  CO2 27  --  25  GLUCOSE 103*  --  95  BUN 11  --  12  CREATININE 0.64  --  0.65  CALCIUM 8.9  --  8.9  MG  --  2.3 2.2    GFR: CrCl cannot be calculated (Unknown ideal weight.).  Liver Function Tests: Recent Labs  Lab 03/19/21 2050  AST 28  ALT 20  ALKPHOS 85  BILITOT 0.4  PROT 8.1  ALBUMIN 3.9    CBG: No results for input(s): GLUCAP in the last 168 hours.   Recent Results (from the past 240 hour(s))  Resp Panel by RT-PCR (Flu A&B, Covid) Nasopharyngeal Swab     Status: None   Collection Time: 03/19/21 11:03 PM   Specimen: Nasopharyngeal Swab; Nasopharyngeal(NP) swabs in vial transport medium  Result Value Ref Range Status   SARS Coronavirus 2 by RT PCR NEGATIVE NEGATIVE Final    Comment: (NOTE) SARS-CoV-2 target nucleic acids are NOT DETECTED.  The SARS-CoV-2 RNA is generally detectable in upper respiratory specimens during the acute phase of infection. The lowest concentration of SARS-CoV-2 viral copies this assay can detect is 138 copies/mL. A negative result does not preclude SARS-Cov-2 infection and should not be used as the sole basis for treatment or other patient management decisions. A negative result may occur with  improper specimen collection/handling, submission of specimen other than nasopharyngeal swab, presence of viral mutation(s) within the areas targeted by  this assay, and inadequate number of viral copies(<138 copies/mL). A negative result must be combined with clinical observations, patient history, and epidemiological information. The expected result is Negative.  Fact Sheet for Patients:  EntrepreneurPulse.com.au  Fact Sheet for Healthcare Providers:  IncredibleEmployment.be  This test is no t yet approved or cleared by the [place] FDA and  has been authorized for detection and/or diagnosis of SARS-CoV-2 by FDA under an Emergency Use Authorization (EUA). This EUA will remain  in effect (meaning this test can be used) for the duration of the COVID-19 declaration under Section 564(b)(1) of the Act, 21 U.S.C.section 360bbb-3(b)(1), unless  the authorization is terminated  or revoked sooner.       Influenza A by PCR NEGATIVE NEGATIVE Final   Influenza B by PCR NEGATIVE NEGATIVE Final    Comment: (NOTE) The Xpert Xpress SARS-CoV-2/FLU/RSV plus assay is intended as an aid in the diagnosis of influenza from Nasopharyngeal swab specimens and should not be used as a sole basis for treatment. Nasal washings and aspirates are unacceptable for Xpert Xpress SARS-CoV-2/FLU/RSV testing.  Fact Sheet for Patients: EntrepreneurPulse.com.au  Fact Sheet for Healthcare Providers: IncredibleEmployment.be  This test is not yet approved or cleared by the [place] FDA and has been authorized for detection and/or diagnosis of SARS-CoV-2 by FDA under an Emergency Use Authorization (EUA). This EUA will remain in effect (meaning this test can be used) for the duration of the COVID-19 declaration under Section 564(b)(1) of the Act, 21 U.S.C. section 360bbb-3(b)(1), unless the authorization is terminated or revoked.  Performed at [facility], [address] [place], [place] [address]   MRSA Next Gen by PCR, Nasal     Status: None   Collection Time: 03/20/21  9:15 AM    Specimen: Nasal Mucosa; Nasal Swab  Result Value Ref Range Status   MRSA by PCR Next Gen NOT DETECTED NOT DETECTED Final    Comment: (NOTE) The GeneXpert MRSA Assay (FDA approved for NASAL specimens only), is one component of a comprehensive MRSA colonization surveillance program. It is not intended to diagnose MRSA infection nor to guide or monitor treatment for MRSA infections. Test performance is not FDA approved in patients less than 65 years old. Performed at [facility], [place] [address] [place], [place] [address]          Radiology Studies: DG Chest 2 View  Result Date: 03/19/2021 CLINICAL DATA:  Shortness of breath EXAM: CHEST - 2 VIEW COMPARISON:  05/25/2017 FINDINGS: Increased interstitial markings. No focal consolidation or frank interstitial edema. No pleural effusion or pneumothorax. Cardiomegaly.  Left subclavian pacemaker. Visualized osseous structures are within normal limits. IMPRESSION: Cardiomegaly.  No frank interstitial edema. Electronically Signed   By: Julian Hy M.D.   On: 03/19/2021 21:47   ECHOCARDIOGRAM COMPLETE  Result Date: 03/20/2021    ECHOCARDIOGRAM REPORT   Patient Name:   OWEN HANSELMAN Date of Exam: 03/20/2021 Medical Rec #:  [id number]    Height:       65.5 in Accession #:    [id number]   Weight:       141.4 lb Date of Birth:  Apr 04, 1936    BSA:          1.717 m Patient Age:    12 years     BP:           198/90 mmHg Patient Gender: F            HR:           60 bpm. Exam Location:  Inpatient Procedure: 2D Echo, 3D Echo, Cardiac Doppler and Color Doppler Indications:    R07.9* Chest pain, unspecified  History:        Patient has prior history of Echocardiogram examinations, most                 recent 06/01/2017. CAD, Abnormal ECG, Arrythmias:Atrial                 Fibrillation and Bradycardia, Signs/Symptoms:Chest Pain; Risk                 Factors:Hypertension and  Dyslipidemia.  Sonographer:    Roseanna Rainbow RDCS Referring Phys: [id number] [facility]   Sonographer Comments: Technically difficult study due to poor echo windows. Patient in high fowler's position. IMPRESSIONS  1. Left ventricular ejection fraction, by estimation, is 55 to 60%. The left ventricle has normal function. The left ventricle has no regional wall motion abnormalities. Left ventricular diastolic parameters are consistent with Grade II diastolic dysfunction (pseudonormalization). Elevated left ventricular end-diastolic pressure.  2. Right ventricular systolic function is normal. The right ventricular size is normal. There is normal pulmonary artery systolic pressure.  3. Left atrial size was moderately dilated.  4. The mitral valve is degenerative. Mild mitral valve regurgitation. No evidence of mitral stenosis.  5. There is partial fusion of the left and non-coronary cusps. The aortic valve is calcified. There is mild calcification of the aortic valve. There is mild thickening of the aortic valve. Aortic valve regurgitation is mild. Mild aortic valve sclerosis is present, with no evidence of aortic valve stenosis. Aortic regurgitation PHT measures 582 msec. Aortic valve area, by VTI measures 2.49 cm. Aortic valve mean gradient measures 3.5 mmHg. Aortic valve Vmax measures 1.23 m/s.  6. Aortic dilatation noted. There is mild dilatation of the aortic root, measuring 37 mm. There is mild dilatation of the ascending aorta, measuring 38 mm.  7. The inferior vena cava is normal in size with greater than 50% respiratory variability, suggesting right atrial pressure of 3 mmHg. FINDINGS  Left Ventricle: Left ventricular ejection fraction, by estimation, is 55 to 60%. The left ventricle has normal function. The left ventricle has no regional wall motion abnormalities. The left ventricular internal cavity size was normal in size. There is  no left ventricular hypertrophy. Left ventricular diastolic parameters are consistent with Grade II diastolic dysfunction (pseudonormalization). Elevated left  ventricular end-diastolic pressure. Right Ventricle: The right ventricular size is normal. No increase in right ventricular wall thickness. Right ventricular systolic function is normal. There is normal pulmonary artery systolic pressure. The tricuspid regurgitant velocity is 2.64 m/s, and  with an assumed right atrial pressure of 3 mmHg, the estimated right ventricular systolic pressure is 123456 mmHg. Left Atrium: Left atrial size was moderately dilated. Right Atrium: Right atrial size was normal in size. Pericardium: There is no evidence of pericardial effusion. Mitral Valve: The mitral valve is degenerative in appearance. There is moderate thickening of the mitral valve leaflet(s). There is moderate calcification of the mitral valve leaflet(s). Mild to moderate mitral annular calcification. Mild mitral valve regurgitation. No evidence of mitral valve stenosis. The mean mitral valve gradient is 1.3 mmHg. Tricuspid Valve: The tricuspid valve is normal in structure. Tricuspid valve regurgitation is mild . No evidence of tricuspid stenosis. Aortic Valve: There is partial fusion of the left and non-coronary cusps. The aortic valve is calcified. There is mild calcification of the aortic valve. There is mild thickening of the aortic valve. Aortic valve regurgitation is mild. Aortic regurgitation PHT measures 582 msec. Mild aortic valve sclerosis is present, with no evidence of aortic valve stenosis. Aortic valve mean gradient measures 3.5 mmHg. Aortic valve peak gradient measures 6.1 mmHg. Aortic valve area, by VTI measures 2.49 cm. Pulmonic Valve: The pulmonic valve was normal in structure. Pulmonic valve regurgitation is trivial. No evidence of pulmonic stenosis. Aorta: Aortic dilatation noted. There is mild dilatation of the aortic root, measuring 37 mm. There is mild dilatation of the ascending aorta, measuring 38 mm. Venous: The inferior vena cava is normal in size with greater than  50% respiratory variability,  suggesting right atrial pressure of 3 mmHg. IAS/Shunts: No atrial level shunt detected by color flow Doppler. Additional Comments: A device lead is visualized.  LEFT VENTRICLE PLAX 2D LVIDd:         3.80 cm      Diastology LVIDs:         2.70 cm      LV e' medial:    3.68 cm/s LV PW:         0.95 cm      LV E/e' medial:  22.6 LV IVS:        1.40 cm      LV e' lateral:   5.48 cm/s LVOT diam:     1.80 cm      LV E/e' lateral: 15.2 LV SV:         68 LV SV Index:   40 LVOT Area:     2.54 cm                              3D Volume EF: LV Volumes (MOD)            3D EF:        51 % LV vol d, MOD A2C: 115.0 ml LV EDV:       94 ml LV vol d, MOD A4C: 55.6 ml  LV ESV:       46 ml LV vol s, MOD A2C: 53.5 ml  LV SV:        48 ml LV vol s, MOD A4C: 22.5 ml LV SV MOD A2C:     61.5 ml LV SV MOD A4C:     55.6 ml LV SV MOD BP:      49.0 ml RIGHT VENTRICLE            IVC RV S prime:     9.38 cm/s  IVC diam: 1.80 cm TAPSE (M-mode): 2.1 cm LEFT ATRIUM             Index       RIGHT ATRIUM           Index LA diam:        3.30 cm 1.92 cm/m  RA Area:     17.80 cm LA Vol (A2C):   55.2 ml 32.15 ml/m RA Volume:   47.10 ml  27.43 ml/m LA Vol (A4C):   56.6 ml 32.97 ml/m LA Biplane Vol: 59.4 ml 34.60 ml/m  AORTIC VALVE AV Area (Vmax):    2.79 cm AV Area (Vmean):   2.39 cm AV Area (VTI):     2.49 cm AV Vmax:           123.50 cm/s AV Vmean:          82.400 cm/s AV VTI:            0.274 m AV Peak Grad:      6.1 mmHg AV Mean Grad:      3.5 mmHg LVOT Vmax:         135.50 cm/s LVOT Vmean:        77.550 cm/s LVOT VTI:          0.268 m LVOT/AV VTI ratio: 0.98 AI PHT:            582 msec  AORTA Ao Root diam: 3.70 cm Ao Asc diam:  3.80 cm MITRAL VALVE  TRICUSPID VALVE MV Area (PHT): 3.08 cm    TR Peak grad:   27.9 mmHg MV Mean grad:  1.3 mmHg    TR Vmax:        264.00 cm/s MV Decel Time: 246 msec MV E velocity: 83.10 cm/s  SHUNTS MV A velocity: 60.40 cm/s  Systemic VTI:  0.27 m MV E/A ratio:  1.38        Systemic Diam: 1.80 cm  Skeet Latch MD Electronically signed by Skeet Latch MD Signature Date/Time: 03/20/2021/5:08:48 PM    Final    CT Angio Chest/Abd/Pel for Dissection W and/or W/WO  Result Date: 03/20/2021 CLINICAL DATA:  Hypertension, back pain EXAM: CT ANGIOGRAPHY CHEST, ABDOMEN AND PELVIS TECHNIQUE: Non-contrast CT of the chest was initially obtained. Multidetector CT imaging through the chest, abdomen and pelvis was performed using the standard protocol during bolus administration of intravenous contrast. Multiplanar reconstructed images and MIPs were obtained and reviewed to evaluate the vascular anatomy. CONTRAST:  173m OMNIPAQUE IOHEXOL 350 MG/ML SOLN COMPARISON:  CTA chest, CT abdomen/pelvis dated 05/25/2017 FINDINGS: CTA CHEST FINDINGS Cardiovascular: On unenhanced CT, there is no evidence of intramural hematoma. Preferential opacification of the thoracic aorta. No evidence of thoracic aortic aneurysm or dissection. Atherosclerotic calcifications of the aortic arch. Although not tailored for evaluation of the pulmonary arteries, there is no evidence of central pulmonary embolism. The heart is top-normal in size. No pericardial effusion. Left subclavian pacemaker. Three vessel coronary atherosclerosis. Mediastinum/Nodes: No suspicious mediastinal lymphadenopathy. Visualized thyroid is unremarkable. Lungs/Pleura: Biapical pleural-parenchymal scarring. Mild centrilobular and paraseptal emphysematous changes, upper lung predominant. No suspicious pulmonary nodules. No focal consolidation. No pleural effusion or pneumothorax. Musculoskeletal: Mild degenerative changes of the mid/lower thoracic spine. Review of the MIP images confirms the above findings. CTA ABDOMEN AND PELVIS FINDINGS VASCULAR Aorta: No evidence abdominal aortic aneurysm or dissection. Atherosclerotic calcifications. Patent. Celiac: Atherosclerotic calcifications at the origin.  Patent. SMA: Atherosclerotic calcifications at the origin.  Patent.  Renals: Patent bilaterally.  Atherosclerotic calcifications. IMA: Patent. Inflow: Patent bilaterally.  Atherosclerotic calcifications. Veins: Unremarkable. Review of the MIP images confirms the above findings. NON-VASCULAR Hepatobiliary: Liver is within normal limits. Status post cholecystectomy. No intrahepatic or extrahepatic ductal dilatation. Pancreas: 11 mm low-density lesion in the pancreatic head (series 5/image 97), new. Mild prominence of the main pancreatic duct, measuring 4-5 mm (series 5/image 91). Spleen: Coarse splenic granuloma. Adrenals/Urinary Tract: Adrenal glands are within normal limits. Scarring in the posterior left lower kidney. Right kidney is within normal limits. No hydronephrosis. Bladder is within normal limits. Stomach/Bowel: Stomach is within normal limits. No evidence of bowel obstruction. Appendix is not discretely visualized. Lymphatic: No suspicious abdominopelvic lymphadenopathy. Reproductive: Status post hysterectomy. Bilateral ovaries are within normal limits. Other: No abdominopelvic ascites. Musculoskeletal: Mild degenerative changes of the lumbar spine. Review of the MIP images confirms the above findings. IMPRESSION: No evidence of thoracoabdominal aortic aneurysm or dissection. 11 mm low-density lesion in the pancreatic head, new. This is poorly evaluated on CT. Follow-up MRI abdomen with/without contrast is suggested in 4 weeks (on an outpatient basis). Electronically Signed   By: SJulian HyM.D.   On: 03/20/2021 00:38        Scheduled Meds:  amiodarone  200 mg Oral Once per day on Mon Tue Wed [name] Fri Sat   amLODipine  10 mg Oral Daily   aspirin EC  81 mg Oral Daily   empagliflozin  10 mg Oral Daily   irbesartan  150 mg Oral Daily   isosorbide-hydrALAZINE  1 tablet Oral TID   metoprolol succinate  50 mg Oral QHS   metoprolol succinate  75 mg Oral Daily   pantoprazole  40 mg Oral Daily   primidone  50 mg Oral QHS   rosuvastatin  20 mg Oral QHS    Continuous Infusions:  heparin 1,000 Units/hr (03/20/21 1732)   nitroGLYCERIN Stopped (03/20/21 0206)     LOS: 1 day   Time spent: 76mns Greater than 50% of this time was spent in counseling, explanation of diagnosis, planning of further management, and coordination of care.   Voice Recognition /Viviann Sparedictation system was used to create this note, attempts have been made to correct errors. Please contact the author with questions and/or clarifications.   FFlorencia Reasons MD PhD FACP [facility]  Available via Epic secure chat 7am-7pm for nonurgent issues Please page for urgent issues To page the attending provider between 7A-7P or the covering provider during after hours 7P-7A, please log into the web site [email or web address] and access using universal [place] password for that web site. If you do not have the password, please call the hospital operator.    03/20/2021, 7:03 PM

## 2021-03-20 NOTE — Consult Note (Addendum)
Cardiology Consultation:   Patient ID: BINTU TUMMINIA MRN: [id number]; DOB: [birth year]  Admit date: 03/19/2021 Date of Consult: 03/20/2021  PCP:  Celene Squibb, MD   [facility] Providers Cardiologist:  Rozann Lesches, MD  Electrophysiologist:  [name], MD  {   Patient Profile:   [name] is a 85 y.o. female with PMH of CAD with DES to RCA in 2010, paroxysmal atrial fibrillation not on anticoagulation, chronic diastolic heart failure, tachybradycardia syndrome s/p Medtronic PPM, HTN, HLD, anxiety, GERD, who is being seen 03/20/2021 for the evaluation of non-STEMI at the request of Dr Myna Hidalgo.  History of Present Illness:   [name] outpatient for CAD and Dr. Lovena Le for paroxysmal A. fib and tachybradycardia syndrome s/p PPM implant.   She has a history of DES to RCA in 2010.  Repeat catheterization 11/17/2011 showed patent RCA stent, LAD without significant change from previous moderate disease, OM1 is a small vessel.  She has been medically managed with aspirin 81, irbesartan 150, metoprolol XL 75, jardiance 10, Crestor 20.  Last NM stress was from 08/25/2014 showed small region of scar with mild peri-infarct ischemia in the inferolateral wall. Normal LV wall motion, EF 56%. Echo last completed on 06/01/2017 showed EF 65-70%, mild AR and MR. She was last seen in the office 03/04/2021, doing well without angina.   She had Medtronic pacemaker implanted for tachybradycardia syndrome by Dr. Lovena Le on 12/27/2008.  Last device check was 02/14/2021, there were 117 atrial arrhythmias detected, longest was 51 minutes, A. fib burden is 0.2%, some episodes of atrial tachycardia and atrial fibrillation, device was functioning normal, battery life 30 months.   For paroxysmal atrial fibrillation, she has CHA2DS2-VASc score of 5, historically not on anticoagulation due to presence, overall has a low A. fib burden based on previous device interrogation.  She is maintained on amiodarone  and metoprolol for rate/rhythm control.  Patient presented to [facility] ER on 03/19/2021 due to elevated blood pressure.  She took her blood pressure at home it was very high.  Patient is very hard of hearing.  Her daughters are at bedside.  Patient states she has been having intermittent precordium chest pain, occurs at random onset, without specific trigger. She had significant chest pain yesterday when she was checking her blood pressure.  She does not remember how long the chest pain lasted, it seemed resolved this morning and currently pain free.   She endorses shortness of breath with exertion.  She remembers 1 episode she was having significant upper back pain and chest pain, meanwhile her hands were numb.  Daughter states patient lives alone, able to ambulate but with unsteady gait, has been progressively getting weaker, fatigued, with worsening shortness of breath on exertion.  Patient states that she checks her blood pressure every day, it tends to be very high up to 200s nightly.  She denied any significant weight gain, peripheral edema.  She takes Lasix as needed when she has abdominal bloating and swelling.  She remembers that she had GI bleed from small intestine 2 to 3 years ago where Dr. Lovena Le take her off Plavix.   Diagnostic work-up noted hypokalemia 3.4, HS trop elevated 2030 >1869. BNP 295. EKG with sinus rhythm 69 bpm, minimal STE of V1-V3, manual Qtc 472 msec. She was hypertensive with SBP 239 at ED, subsequently admitted to hospital medicine service. She later mentioned she had intermittent chest pain, fatigue, nausea for 1 month. She was given ASA and started  on IV heparin gtt, eventually transferred from [place] to [facility] for further evaluation. Cardiology is consulted today for further input.     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   CATARACT EXTRACTION     CHOLECYSTECTOMY     COLONOSCOPY  2011   COLONOSCOPY N/A 10/19/2014   Procedure: COLONOSCOPY;  Surgeon: Rogene Houston, MD;  Location: [facility];  Service: Endoscopy;  Laterality: N/A;  1030   ESOPHAGEAL DILATION N/A 05/02/2020   Procedure: ESOPHAGEAL DILATION;  Surgeon: Rogene Houston, MD;  Location: [facility];  Service: Gastroenterology;  Laterality: N/A;   ESOPHAGOGASTRODUODENOSCOPY N/A 10/26/2014   Procedure: ESOPHAGOGASTRODUODENOSCOPY (EGD);  Surgeon: Rogene Houston, MD;  [facility]: [facility];  Service: Endoscopy;  Laterality: N/A;  33 - Dr. has lunch and learn   ESOPHAGOGASTRODUODENOSCOPY (EGD) WITH PROPOFOL N/A 05/02/2020   Procedure: ESOPHAGOGASTRODUODENOSCOPY (EGD) WITH PROPOFOL;  Surgeon: Rogene Houston, MD;  [facility]: [facility];  Service: Gastroenterology;  Laterality: N/A;  250   Esophagogastroduodenoscopy with esophageal dilation  2004, 2006, 2007   GIVENS CAPSULE STUDY N/A 10/31/2014   Procedure: GIVENS CAPSULE STUDY;  Surgeon: Rogene Houston, MD;  [facility]: [facility];  Service: Endoscopy;  Laterality: N/A;  730 -- pacemaker--needs monitoring--outpatient bed   INSERT / REPLACE / REMOVE PACEMAKER  2010   LEFT HEART CATHETERIZATION WITH CORONARY ANGIOGRAM N/A 11/17/2011   Procedure: LEFT HEART CATHETERIZATION WITH  CORONARY ANGIOGRAM;  Surgeon: Sherren Mocha, MD;  Location: [facility] CATH LAB;  Service: Cardiovascular;  Laterality: N/A;   LEFT HEART CATHETERIZATION WITH CORONARY ANGIOGRAM N/A 09/26/2014   Procedure: LEFT HEART CATHETERIZATION WITH CORONARY ANGIOGRAM;  Surgeon: Leonie Man, MD;  Location: [facility] CATH LAB;  Service: Cardiovascular;  Laterality: N/A;   Right rotator cuff repair     SHOULDER ACROMIOPLASTY Right 05/30/2015   Procedure: RIGHT SHOULDER ACROMIOPLASTY;  Surgeon: Carole Civil, MD;  Location: [facility] ORS;  Service: Orthopedics;  Laterality: Right;   SHOULDER OPEN ROTATOR CUFF REPAIR Right 05/30/2015   Procedure: OPEN ROTATOR CUFF REPAIR RIGHT SHOULDER;  Surgeon: Carole Civil, MD;  Location: [facility] OR[facility];  Service: Orthopedics;  Laterality: Right;   SHOULDER OPEN ROTATOR CUFF REPAIR Left 10/22/2016   Procedure: ROTATOR CUFF REPAIR SHOULDER OPEN;  Surgeon: Carole Civil, MD;  Location: [facility];  Service: Orthopedics;  Laterality: Left;   Subxiphoid pericardial window  11/10   VAGINAL HYSTERECTOMY       Home Medications:  Prior to Admission medications   Medication Sig Start Date End Date Taking? Authorizing Provider  ALPRAZolam (  XANAX) 1 MG tablet Take 1 mg by mouth 3 (three) times daily as needed. 01/02/21  Yes [provider]  amiodarone (PACERONE) 200 MG tablet Take 200 mg Daily Monday - Saturday None on Sunday 07/06/20  Yes Evans Lance, MD  aspirin EC 81 MG tablet Take 1 tablet (81 mg total) by mouth daily. Swallow whole. 07/06/20  Yes Evans Lance, MD  Cyanocobalamin (VITAMIN B-12) 5000 MCG SUBL Take 5,000 mcg by mouth daily.   Yes [provider]  folic acid (FOLVITE) A999333 MCG tablet Take 400 mcg by mouth daily.   Yes [provider]  furosemide (LASIX) 40 MG tablet Take 40 mg by mouth See admin instructions. Take 1 tablet (40 mg) by mouth (scheduled) in the morning, may take an additional dose if needed for swelling. 12/04/11  Yes Satira Sark, MD   irbesartan (AVAPRO) 150 MG tablet TAKE (1) TABLET BY MOUTH ONCE DAILY. 10/04/20  Yes Satira Sark, MD  JARDIANCE 10 MG TABS tablet Take 10 mg by mouth daily. 02/07/21  Yes [provider]  metoprolol succinate (TOPROL-XL) 25 MG 24 hr tablet Take 3 tablets (75 mg total) by mouth in the morning. & 50 mg in the evening 05/30/20  Yes Satira Sark, MD  Multiple Vitamin (MULTIVITAMIN WITH MINERALS) TABS tablet Take 1 tablet by mouth daily.    Yes [provider]  nitroGLYCERIN (NITROSTAT) 0.4 MG SL tablet Place 0.4 mg under the tongue every 5 (five) minutes x 3 doses as needed for chest pain.    Yes [provider]  omeprazole (PRILOSEC) 40 MG capsule Take 1 capsule (40 mg total) by mouth daily before breakfast. 07/03/20  Yes Rehman, Mechele Dawley, MD  ondansetron (ZOFRAN) 4 MG tablet Take 4 mg by mouth every 6 (six) hours as needed for nausea or vomiting.  03/12/20  Yes [provider]  potassium chloride SA (K-DUR,KLOR-CON) 20 MEQ tablet Take 20 mEq by mouth See admin instructions. Takes 1 tablet (20 meq) by mouth daily (scheduled) & a 2nd tablet only if has to take a 2nd dose of Lasix   Yes Rexene Alberts, MD  primidone (MYSOLINE) 50 MG tablet Take 50 mg by mouth at bedtime. 10/31/10  Yes [provider]  PROAIR HFA 108 (90 Base) MCG/ACT inhaler Inhale 1 puff into the lungs every 4 (four) hours as needed for wheezing or shortness of breath.  11/10/18  Yes [provider]  VITAMIN D, CHOLECALCIFEROL, PO Take 1 tablet by mouth daily.   Yes [provider]  clobetasol cream (TEMOVATE) [id number] % Apply 1 application topically 2 (two) times daily. Patient not taking: No sig reported 08/07/20   Faustino Congress, NP  diclofenac Sodium (VOLTAREN) 1 % GEL Apply 2-4 g topically every 6 (six) hours as needed (leg/knee pain.).  Patient not taking: Reported on 03/19/2021 04/02/20   [provider]  LORazepam (ATIVAN) 0.5 MG tablet Take 0.5 mg by mouth  2 (two) times daily.  Patient not taking: No sig reported 05/22/20   [provider]  rosuvastatin (CRESTOR) 20 MG tablet Take 1 tablet (20 mg total) by mouth at bedtime. Patient not taking: No sig reported 10/04/20   Satira Sark, MD  triamcinolone (KENALOG) 0.025 % cream SMARTSIG:Sparingly Topical Twice Daily Patient not taking: No sig reported 07/04/20   [provider]    Inpatient Medications: Scheduled Meds:  amiodarone  200 mg Oral Once per day on Mon Tue Wed Thu Fri Sat  aspirin EC  81 mg Oral Daily   empagliflozin  10 mg Oral Daily   irbesartan  150 mg Oral Daily   isosorbide-hydrALAZINE  1 tablet Oral TID   metoprolol succinate  50 mg Oral QHS   metoprolol succinate  75 mg Oral Daily   pantoprazole  40 mg Oral Daily   primidone  50 mg Oral QHS   rosuvastatin  20 mg Oral QHS   Continuous Infusions:  heparin 900 Units/hr (03/20/21 0758)   nitroGLYCERIN Stopped (03/20/21 0206)   PRN Meds: acetaminophen, ALPRAZolam, oxyCODONE  Allergies:    Allergies  Allergen Reactions   Amitriptyline Hcl Other (See Comments)    Caused "jaws to twist and lock"   Sulfonamide Derivatives Other (See Comments)    UNKNOWN REACTION    Social History:   Social History   Socioeconomic History   Marital status: Widowed    Spouse name: Not on file   Number of children: 1   Years of education: Not on file   Highest education level: 8th grade  Occupational History   Occupation: Retired    Fish farm manager: RETIRED  Tobacco Use   Smoking status: Former    Packs/day: 2.00    Years: 10.00    Pack years: 20.00    Types: Cigarettes    Quit date: 05/23/1989    Years since quitting: 31.8   Smokeless tobacco: Never  Vaping Use   Vaping Use: Never used  Substance and Sexual Activity   Alcohol use: No    Alcohol/week: 0.0 standard drinks   Drug use: No   Sexual activity: Never  Other Topics Concern   Not on file  Social History Narrative   She lives alone, has  adopted daughter.   Social Determinants of Health   Financial Resource Strain: Not on file  Food Insecurity: No Food Insecurity   Worried About Charity fundraiser in the Last Year: Never true   Ran Out of Food in the Last Year: Never true  Transportation Needs: No Transportation Needs   Lack of Transportation (Medical): No   Lack of Transportation (Non-Medical): No  Physical Activity: Not on file  Stress: Not on file  Social Connections: Not on file  Intimate Partner Violence: Not on file    Family History:    Family History  Problem Relation Age of Onset   Cancer Mother        Colon    Coronary artery disease Sister    Coronary artery disease Brother    Arthritis Other    Lung disease Other    Asthma Other      ROS:  Constitutional: Denied fever, chills, malaise, night sweats Eyes: Denied vision change or loss Ears/Nose/Mouth/Throat: Denied ear ache, sore throat, coughing, sinus pain Cardiovascular: see HPI  Respiratory: see HPI  Gastrointestinal: Denied nausea, vomiting, abdominal pain, diarrhea Genital/Urinary: Denied dysuria, hematuria, urinary frequency/urgency Musculoskeletal: Global weakness, unsteady gait Skin: Denied rash, wound Neuro: Denied headache, dizziness, syncope Psych: Denied history of depression/anxiety  Endocrine: Denied history of diabetes   Physical Exam/Data:   Vitals:   03/20/21 0730 03/20/21 0801 03/20/21 0808 03/20/21 0913  BP: (!) 163/88 (!) 173/87 (!) 169/83 (!) 198/90  Pulse:  60 (!) 59   Resp: (!) 23 (!) '25 19 19  '$ Temp:    97.6 F (36.4 C)  TempSrc:    Oral  SpO2: 96% 99% 96%     Intake/Output Summary (Last 24 hours) at 03/20/2021 1422 Last data filed at 03/20/2021 0231  Gross per 24 hour  Intake 179.22 ml  Output 1100 ml  Net -920.78 ml   Last 3 Weights 03/04/2021 09/03/2020 08/07/2020  Weight (lbs) 141 lb 6.4 oz 146 lb 150 lb  Weight (kg) 64.139 kg 66.225 kg 68.04 kg     There is no height or weight on file to calculate  BMI.   Vitals:  Vitals:   03/20/21 0808 03/20/21 0913  BP: (!) 169/83 (!) 198/90  Pulse: (!) 59   Resp: 19 19  Temp:  97.6 F (36.4 C)  SpO2: 96%    General Appearance: In no apparent distress, laying in bed, frail elderly HEENT: Normocephalic, atraumatic. EOMs intact.  Neck: Supple, trachea midline, no JVDs Cardiovascular: Regular rate and rhythm, normal S1-S2,  no murmur/rub/gallop Respiratory: Resting breathing unlabored, lungs sounds clear to auscultation bilaterally, no use of accessory muscles. On 2LNC pox 97%.  No wheezes, rales or rhonchi.   Gastrointestinal: Bowel sounds positive, abdomen soft, non-tender, non-distended.   Extremities: Able to move all extremities in bed without difficulty, no edema/cyanosis/clubbing Genitourinary: genital exam not performed Musculoskeletal: Normal muscle bulk and tone, global weakness Skin: Intact, warm, dry.  petechiae noted of arms  Neurologic: Alert, oriented to person, place and time. HOH. Fluent speech,  no cognitive deficit, no gross focal neuro deficit Psychiatric: Normal affect. Mood is appropriate.     EKG:  The EKG was personally reviewed and demonstrates:  EKG with sinus rhythm 69 bpm, minimal STE of V1-V3, manual Qtc 472 msec.  Telemetry:  Telemetry was personally reviewed and demonstrates:  Paced rhythm    Relevant CV Studies:   PPM device check on 02/14/2021:  There were 117 atrial arrhythmias detected, the longest was 51 minutes, AF burden is 0.2% of the time, some episode AT and some are AF, ? [place], sent to triage. Battery life 30 months.   Echo from 06/01/2017:  - Left ventricle: The cavity size was normal. Wall thickness was    normal. Systolic function was vigorous. The estimated ejection    fraction was in the range of 65% to 70%. The study is not    technically sufficient to allow evaluation of LV diastolic    function.  - Aortic valve: There was mild regurgitation.  - Mitral valve: Calcified annulus. Mildly  thickened leaflets .    There was mild regurgitation.  - Left atrium: The atrium was moderately dilated.  - Right atrium: The atrium was mildly dilated.  NM stress from 08/25/2014:  1. Small region of scar with mild peri-infarct ischemia in the inferolateral wall.   2. Normal left ventricular wall motion.   3. Left ventricular ejection fraction 56%   4. Low-risk stress test findings*.   Laboratory Data:  High Sensitivity Troponin:   Recent Labs  Lab 03/19/21 2050 03/19/21 2320  TROPONINIHS 2,030* 1,869*     Chemistry Recent Labs  Lab 03/19/21 2050 03/20/21 0336  NA 138 139  K 3.4* 4.4  CL 107 105  CO2 27 25  GLUCOSE 103* 95  BUN 11 12  CREATININE 0.64 0.65  CALCIUM 8.9 8.9  GFRNONAA >60 >60  ANIONGAP 4* 9    Recent Labs  Lab 03/19/21 2050  PROT 8.1  ALBUMIN 3.9  AST 28  ALT 20  ALKPHOS 85  BILITOT 0.4   Hematology Recent Labs  Lab 03/19/21 2050 03/20/21 0336  WBC 8.6 7.9  RBC 4.17 3.93  HGB 13.0 12.0  HCT 40.3 38.0  MCV 96.6 96.7  MCH 31.2 30.5  MCHC 32.3 31.6  RDW 13.8 13.7  PLT 216 200   BNP Recent Labs  Lab 03/19/21 2320  BNP 295.0*    DDimer No results for input(s): DDIMER in the last 168 hours.   Radiology/Studies:  DG Chest 2 View  Result Date: 03/19/2021 CLINICAL DATA:  Shortness of breath EXAM: CHEST - 2 VIEW COMPARISON:  05/25/2017 FINDINGS: Increased interstitial markings. No focal consolidation or frank interstitial edema. No pleural effusion or pneumothorax. Cardiomegaly.  Left subclavian pacemaker. Visualized osseous structures are within normal limits. IMPRESSION: Cardiomegaly.  No frank interstitial edema. Electronically Signed   By: Julian Hy M.D.   On: 03/19/2021 21:47   CT Angio Chest/Abd/Pel for Dissection W and/or W/WO  Result Date: 03/20/2021 CLINICAL DATA:  Hypertension, back pain EXAM: CT ANGIOGRAPHY CHEST, ABDOMEN AND PELVIS TECHNIQUE: Non-contrast CT of the chest was initially obtained. Multidetector CT  imaging through the chest, abdomen and pelvis was performed using the standard protocol during bolus administration of intravenous contrast. Multiplanar reconstructed images and MIPs were obtained and reviewed to evaluate the vascular anatomy. CONTRAST:  114m OMNIPAQUE IOHEXOL 350 MG/ML SOLN COMPARISON:  CTA chest, CT abdomen/pelvis dated 05/25/2017 FINDINGS: CTA CHEST FINDINGS Cardiovascular: On unenhanced CT, there is no evidence of intramural hematoma. Preferential opacification of the thoracic aorta. No evidence of thoracic aortic aneurysm or dissection. Atherosclerotic calcifications of the aortic arch. Although not tailored for evaluation of the pulmonary arteries, there is no evidence of central pulmonary embolism. The heart is top-normal in size. No pericardial effusion. Left subclavian pacemaker. Three vessel coronary atherosclerosis. Mediastinum/Nodes: No suspicious mediastinal lymphadenopathy. Visualized thyroid is unremarkable. Lungs/Pleura: Biapical pleural-parenchymal scarring. Mild centrilobular and paraseptal emphysematous changes, upper lung predominant. No suspicious pulmonary nodules. No focal consolidation. No pleural effusion or pneumothorax. Musculoskeletal: Mild degenerative changes of the mid/lower thoracic spine. Review of the MIP images confirms the above findings. CTA ABDOMEN AND PELVIS FINDINGS VASCULAR Aorta: No evidence abdominal aortic aneurysm or dissection. Atherosclerotic calcifications. Patent. Celiac: Atherosclerotic calcifications at the origin.  Patent. SMA: Atherosclerotic calcifications at the origin.  Patent. Renals: Patent bilaterally.  Atherosclerotic calcifications. IMA: Patent. Inflow: Patent bilaterally.  Atherosclerotic calcifications. Veins: Unremarkable. Review of the MIP images confirms the above findings. NON-VASCULAR Hepatobiliary: Liver is within normal limits. Status post cholecystectomy. No intrahepatic or extrahepatic ductal dilatation. Pancreas: 11 mm  low-density lesion in the pancreatic head (series 5/image 97), new. Mild prominence of the main pancreatic duct, measuring 4-5 mm (series 5/image 91). Spleen: Coarse splenic granuloma. Adrenals/Urinary Tract: Adrenal glands are within normal limits. Scarring in the posterior left lower kidney. Right kidney is within normal limits. No hydronephrosis. Bladder is within normal limits. Stomach/Bowel: Stomach is within normal limits. No evidence of bowel obstruction. Appendix is not discretely visualized. Lymphatic: No suspicious abdominopelvic lymphadenopathy. Reproductive: Status post hysterectomy. Bilateral ovaries are within normal limits. Other: No abdominopelvic ascites. Musculoskeletal: Mild degenerative changes of the lumbar spine. Review of the MIP images confirms the above findings. IMPRESSION: No evidence of thoracoabdominal aortic aneurysm or dissection. 11 mm low-density lesion in the pancreatic head, new. This is poorly evaluated on CT. Follow-up MRI abdomen with/without contrast is suggested in 4 weeks (on an outpatient basis). Electronically Signed   By: SJulian HyM.D.   On: 03/20/2021 00:38     Assessment and Plan:   Non-STEMI CAD with history of DES to RCA 2010 - Presented with hypertensive urgency with BP 239/85 at ED, reports 2-3 weeks onset intermittent chest pain/fatigue/nausea/DOE - HS trop 2030 >1869 - EKG showed  nonspecific ST-T change - Last NM stress from 08/25/2014 showed Small region of scar with mild peri-infarct ischemia in the inferolateral wall. EF preserved. Normal WM.  - Echo from 2018 showed EF 65 to 70%, mild MR and AR, moderately dilated LA, mildly dilated RA - She was initiated on heparin infusion, continued on medical therapy with ASA '81mg'$  daily, irbesartan 150 mg daily, Toprol '75mg'$  AM/50 mg PM, and Crestor 20 mg daily - will repeat echocardiogram, repeat A1C and lipid panel  - Discussed risk versus benefit of further ischemic work-up with cardiac  catheterization versus conservative medical management, patient wishes to pursue cardiac catheterization and understands the risks, daughters are at bedside and in agreement - will resume Jardiance '10mg'$  daily   Hypertensive urgency - Blood pressure was elevated 239/85 at ED, had gradual improvement over the past 24 hours and is now elevated again 198/90 - on metoprolol XL '75mg'$  AM and '50mg'$  PM and irbesartan 150 mg daily - will add bidil TID for HTN   Paroxysmal atrial fibrillation -Currently paced, rate/rhythm controlled on amiodarone and metoprolol -CHA2DS2-VASc score is 5, historically refusing anticoagulation  Chronic diastolic heart failure -Chest x-ray without acute finding, BNP not impressive, clinically euvolemic -Echo from 2018 as above, will repeat Echo  -Home dose Lasix 40 mg PRN is held currently, may continue PRN   History of tachybradycardia syndrome -History of PPM from Medtronic placed 2010 by Dr. Lovena Le, recent device check from July without problem  Hyperlipidemia - will update lipid panel - Continue Crestor 20 mg daily     Risk Assessment/Risk Scores:   HEAR Score (for undifferentiated chest pain):  HEAR Score: 6{   New York Heart Association (NYHA) Functional Class NYHA Class II  CHA2DS2-VASc Score = 6  This indicates a 9.7% annual risk of stroke. The patient's score is based upon: CHF History: Yes HTN History: Yes Diabetes History: No Stroke History: No Vascular Disease History: Yes Age Score: 2 Gender Score: 1       For questions or updates, please contact [place] Please consult [email or web address] for contact info under    Signed, Margie Billet, NP  03/20/2021 2:22 PM

## 2021-03-20 NOTE — ED Notes (Signed)
Patient is resting comfortably. 

## 2021-03-20 NOTE — Progress Notes (Signed)
ANTICOAGULATION CONSULT NOTE -   Pharmacy Consult for Heparin  Indication: chest pain/ACS  Allergies  Allergen Reactions   Amitriptyline Hcl Other (See Comments)    Caused "jaws to twist and lock"   Sulfonamide Derivatives Other (See Comments)    UNKNOWN REACTION    Vital Signs: Temp: 98 F (36.7 C) (08/17 0300) Temp Source: Oral (08/17 0300) BP: 163/88 (08/17 0730) Pulse Rate: 59 (08/17 0630)  Labs: Recent Labs    03/19/21 2050 03/19/21 2320 03/20/21 0336 03/20/21 0712  HGB 13.0  --  12.0  --   HCT 40.3  --  38.0  --   PLT 216  --  200  --   HEPARINUNFRC  --   --   --  0.18*  CREATININE 0.64  --  0.65  --   TROPONINIHS 2,030* 1,869*  --   --      CrCl cannot be calculated (Unknown ideal weight.).   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 Consulted to start heparin. Pt does have a history of afib but has not been on any anti-coagulation. CBC/renal function good. PTA meds reviewed.   HL 0.18- subtherapeutic Trop 1,869 CBC WNL  Goal of Therapy:  Heparin level 0.3-0.7 units/ml Monitor platelets by anticoagulation protocol: Yes   Plan:  Heparin 1500 units BOLUS Increase heparin infusion to 900 units/hr Heparin level in 8 hours and daily. Monitor for bleeding  Margot Ables, PharmD Clinical Pharmacist 03/20/2021 7:43 AM

## 2021-03-20 NOTE — Progress Notes (Signed)
ANTICOAGULATION CONSULT NOTE -   Pharmacy Consult for Heparin  Indication: chest pain/ACS  Allergies  Allergen Reactions   Amitriptyline Hcl Other (See Comments)    Caused "jaws to twist and lock"   Sulfonamide Derivatives Other (See Comments)    UNKNOWN REACTION    Vital Signs: Temp: 97.6 F (36.4 C) (08/17 0913) Temp Source: Oral (08/17 0913) BP: 198/90 (08/17 0913) Pulse Rate: 59 (08/17 0808)  Labs: Recent Labs    03/19/21 2050 03/19/21 2320 03/20/21 0336 03/20/21 0712 03/20/21 1605  HGB 13.0  --  12.0  --   --   HCT 40.3  --  38.0  --   --   PLT 216  --  200  --   --   HEPARINUNFRC  --   --   --  0.18* 0.21*  CREATININE 0.64  --  0.65  --   --   TROPONINIHS 2,030* 1,869*  --   --   --      CrCl cannot be calculated (Unknown ideal weight.).   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 Consulted to start heparin. Pt does have a history of afib but has not been on any anti-coagulation. CBC/renal function good. PTA meds reviewed.   HL 0.21- subtherapeutic on heparin 900 units/hr. No s/s of bleeding reported   Goal of Therapy:  Heparin level 0.3-0.7 units/ml Monitor platelets by anticoagulation protocol: Yes   Plan:  Heparin 1000 units BOLUS Increase heparin infusion to  1000 units/hr Heparin level in 8 hours and daily. Monitor for bleeding  Albertina Parr, PharmD., BCPS, BCCCP Clinical Pharmacist Please refer to [facility] for unit-specific pharmacist

## 2021-03-20 NOTE — Progress Notes (Addendum)
PIV consult. Patient requested to speak with cardiology service about echo before deciding on having procedure 8/18. RN will re-enter consult in the morning if additional IV site is needed.

## 2021-03-20 NOTE — Plan of Care (Signed)

## 2021-03-21 ENCOUNTER — Encounter (HOSPITAL_COMMUNITY): Payer: Self-pay | Admitting: Interventional Cardiology

## 2021-03-21 ENCOUNTER — Encounter (HOSPITAL_COMMUNITY)
Admission: EM | Disposition: A | Discharge status: Home Health | Payer: Self-pay | Source: Home / Self Care | Attending: Internal Medicine

## 2021-03-21 DIAGNOSIS — I160 Hypertensive urgency: Secondary | ICD-10-CM

## 2021-03-21 DIAGNOSIS — I25118 Atherosclerotic heart disease of native coronary artery with other forms of angina pectoris: Secondary | ICD-10-CM

## 2021-03-21 DIAGNOSIS — I169 Hypertensive crisis, unspecified: Secondary | ICD-10-CM

## 2021-03-21 DIAGNOSIS — I214 Non-ST elevation (NSTEMI) myocardial infarction: Secondary | ICD-10-CM

## 2021-03-21 HISTORY — PX: LEFT HEART CATH AND CORONARY ANGIOGRAPHY: CATH118249

## 2021-03-21 LAB — BASIC METABOLIC PANEL
Anion gap: 7 (ref 5–15)
BUN: 13 mg/dL (ref 8–23)
CO2: 28 mmol/L (ref 22–32)
Calcium: 9.5 mg/dL (ref 8.9–10.3)
Chloride: 102 mmol/L (ref 98–111)
Creatinine, Ser: 0.68 mg/dL (ref 0.44–1.00)
GFR, Estimated: >60 mL/min (ref >60)
Glucose, Bld: 89 mg/dL (ref 70–99)
Potassium: 4.4 mmol/L (ref 3.5–5.1)
Sodium: 137 mmol/L (ref 135–145)

## 2021-03-21 LAB — CBC
HCT: 39.6 % (ref 36.0–46.0)
Hemoglobin: 12.5 g/dL (ref 12.0–15.0)
MCH: 30.2 pg (ref 26.0–34.0)
MCHC: 31.6 g/dL (ref 30.0–36.0)
MCV: 95.7 fL (ref 80.0–100.0)
Platelets: 206 10*3/uL (ref 150–400)
RBC: 4.14 MIL/uL (ref 3.87–5.11)
RDW: 14 % (ref 11.5–15.5)
WBC: 7.7 10*3/uL (ref 4.0–10.5)
nRBC: 0 % (ref 0.0–0.2)

## 2021-03-21 LAB — HEPARIN LEVEL (UNFRACTIONATED): Heparin Unfractionated: 0.25 IU/mL — ABNORMAL LOW (ref 0.30–0.70)

## 2021-03-21 SURGERY — LEFT HEART CATH AND CORONARY ANGIOGRAPHY
Anesthesia: LOCAL

## 2021-03-21 MED ORDER — MIDAZOLAM HCL 2 MG/2ML IJ SOLN
INTRAMUSCULAR | Status: DC | PRN
  Administered 2021-03-21: 0.5 mg via INTRAVENOUS

## 2021-03-21 MED ORDER — SODIUM CHLORIDE 0.9% FLUSH: 3.0000 mL | INTRAVENOUS | Status: DC | PRN

## 2021-03-21 MED ORDER — SODIUM CHLORIDE 0.9% FLUSH
3.0000 mL | Freq: Two times a day (BID) | INTRAVENOUS | Status: DC
  Administered 2021-03-21 (×2): 3 mL via INTRAVENOUS

## 2021-03-21 MED ORDER — VERAPAMIL HCL 2.5 MG/ML IV SOLN
INTRAVENOUS | Status: AC
  Filled 2021-03-21: qty 2

## 2021-03-21 MED ORDER — HEPARIN SODIUM (PORCINE) 1000 UNIT/ML IJ SOLN
INTRAMUSCULAR | Status: AC
  Filled 2021-03-21: qty 1

## 2021-03-21 MED ORDER — HEPARIN (PORCINE) 25000 UT/250ML-% IV SOLN
1100.0000 [IU]/h | INTRAVENOUS | Status: DC
  Administered 2021-03-21: 1100 [IU]/h via INTRAVENOUS
  Filled 2021-03-21: qty 250

## 2021-03-21 MED ORDER — HEPARIN (PORCINE) IN NACL 1000-0.9 UT/500ML-% IV SOLN
INTRAVENOUS | Status: DC | PRN
  Administered 2021-03-21: 500 mL

## 2021-03-21 MED ORDER — HEPARIN (PORCINE) IN NACL 1000-0.9 UT/500ML-% IV SOLN
INTRAVENOUS | Status: DC | PRN
Start: 2021-03-21 — End: 2021-03-21
  Administered 2021-03-21: 500 mL

## 2021-03-21 MED ORDER — FENTANYL CITRATE (PF) 100 MCG/2ML IJ SOLN
INTRAMUSCULAR | Status: DC | PRN
  Administered 2021-03-21: 25 ug via INTRAVENOUS

## 2021-03-21 MED ORDER — SODIUM CHLORIDE 0.9% FLUSH
3.0000 mL | Freq: Two times a day (BID) | INTRAVENOUS | Status: DC
  Administered 2021-03-21: 3 mL via INTRAVENOUS

## 2021-03-21 MED ORDER — OXYCODONE HCL 5 MG PO TABS: 5.0000 mg | ORAL_TABLET | ORAL | Status: DC | PRN

## 2021-03-21 MED ORDER — SODIUM CHLORIDE 0.9 % IV SOLN: 250.0000 mL | INTRAVENOUS | Status: DC | PRN

## 2021-03-21 MED ORDER — HEPARIN SODIUM (PORCINE) 1000 UNIT/ML IJ SOLN
INTRAMUSCULAR | Status: DC | PRN
  Administered 2021-03-21: 3500 [IU] via INTRAVENOUS

## 2021-03-21 MED ORDER — SODIUM CHLORIDE 0.9 % WEIGHT BASED INFUSION
3.0000 mL/kg/h | INTRAVENOUS | Status: DC
  Administered 2021-03-21: 3 mL/kg/h via INTRAVENOUS

## 2021-03-21 MED ORDER — SENNOSIDES-DOCUSATE SODIUM 8.6-50 MG PO TABS
1.0000 | ORAL_TABLET | Freq: Two times a day (BID) | ORAL | Status: DC
  Administered 2021-03-21 – 2021-03-22 (×3): 1 via ORAL
  Filled 2021-03-21 (×3): qty 1

## 2021-03-21 MED ORDER — SODIUM CHLORIDE 0.9 % IV SOLN: INTRAVENOUS | Status: AC

## 2021-03-21 MED ORDER — FENTANYL CITRATE (PF) 100 MCG/2ML IJ SOLN
INTRAMUSCULAR | Status: AC
  Filled 2021-03-21: qty 2

## 2021-03-21 MED ORDER — ATORVASTATIN CALCIUM 80 MG PO TABS
80.0000 mg | ORAL_TABLET | Freq: Every day | ORAL | Status: DC
  Administered 2021-03-22: 80 mg via ORAL
  Filled 2021-03-21: qty 1

## 2021-03-21 MED ORDER — LIDOCAINE HCL (PF) 1 % IJ SOLN
INTRAMUSCULAR | Status: AC
  Filled 2021-03-21: qty 30

## 2021-03-21 MED ORDER — ASPIRIN 81 MG PO CHEW: 81.0000 mg | CHEWABLE_TABLET | Freq: Every day | ORAL | Status: DC

## 2021-03-21 MED ORDER — VERAPAMIL HCL 2.5 MG/ML IV SOLN
INTRAVENOUS | Status: DC | PRN
  Administered 2021-03-21: 10 mL via INTRA_ARTERIAL

## 2021-03-21 MED ORDER — ACETAMINOPHEN 325 MG PO TABS: 650.0000 mg | ORAL_TABLET | ORAL | Status: DC | PRN

## 2021-03-21 MED ORDER — HEPARIN (PORCINE) IN NACL 1000-0.9 UT/500ML-% IV SOLN
INTRAVENOUS | Status: AC
  Filled 2021-03-21: qty 1000

## 2021-03-21 MED ORDER — LIDOCAINE HCL (PF) 1 % IJ SOLN
INTRAMUSCULAR | Status: DC | PRN
  Administered 2021-03-21: 2 mL

## 2021-03-21 MED ORDER — ALPRAZOLAM 0.5 MG PO TABS
0.5000 mg | ORAL_TABLET | Freq: Three times a day (TID) | ORAL | Status: DC | PRN
  Administered 2021-03-21 (×2): 0.5 mg via ORAL
  Filled 2021-03-21 (×2): qty 1

## 2021-03-21 MED ORDER — IOHEXOL 350 MG/ML SOLN
INTRAVENOUS | Status: DC | PRN
  Administered 2021-03-21: 80 mL

## 2021-03-21 MED ORDER — ASPIRIN 81 MG PO CHEW
81.0000 mg | CHEWABLE_TABLET | ORAL | Status: AC
  Administered 2021-03-21: 81 mg via ORAL
  Filled 2021-03-21: qty 1

## 2021-03-21 MED ORDER — MIDAZOLAM HCL 2 MG/2ML IJ SOLN
INTRAMUSCULAR | Status: AC
  Filled 2021-03-21: qty 2

## 2021-03-21 MED ORDER — LABETALOL HCL 5 MG/ML IV SOLN: 10.0000 mg | INTRAVENOUS | Status: AC | PRN

## 2021-03-21 MED ORDER — SODIUM CHLORIDE 0.9 % WEIGHT BASED INFUSION: 1.0000 mL/kg/h | INTRAVENOUS | Status: DC

## 2021-03-21 MED ORDER — HYDRALAZINE HCL 20 MG/ML IJ SOLN: 10.0000 mg | INTRAMUSCULAR | Status: AC | PRN

## 2021-03-21 SURGICAL SUPPLY — 10 items
CATH 5FR JL3.5 JR4 ANG PIG MP (CATHETERS) ×1 IMPLANT
DEVICE RAD COMP TR BAND LRG (VASCULAR PRODUCTS) ×1 IMPLANT
GLIDESHEATH SLEND A-KIT 6F 22G (SHEATH) ×1 IMPLANT
GUIDEWIRE INQWIRE 1.5J.035X260 (WIRE) IMPLANT
INQWIRE 1.5J .035X260CM (WIRE) ×2
KIT HEART LEFT (KITS) ×2 IMPLANT
PACK CARDIAC CATHETERIZATION (CUSTOM PROCEDURE TRAY) ×2 IMPLANT
SHEATH PROBE COVER 6X72 (BAG) ×1 IMPLANT
TRANSDUCER W/STOPCOCK (MISCELLANEOUS) ×2 IMPLANT
TUBING CIL FLEX 10 FLL-RA (TUBING) ×2 IMPLANT

## 2021-03-21 NOTE — CV Procedure (Signed)
Calcified multivessel/three-vessel CAD. 30% body stenosis. Calcified ostial LAD 70 to 80%.  Mid LAD tandem mid and distal 80% LAD stenoses. Diffusely diseased circumflex with segmental proximal to mid 80% first obtuse marginal stenosis and diffuse 70% mid circumflex stenosis.  There is distal circumflex 75% stenosis before the second obtuse marginal. RCA contains proximal stent with diffuse ISR up to 60 to 70%, followed by segmental calcified mid to distal 85 to 90% stenosis.  RCA is dominant.  PDA and LV branch are large. LV function is normal.  EF 55%.  LVEDP is less than 18 mmHg.  Discussed with attending cardiologist, Dr. Harrell Gave.  She has surgical anatomy but not sure if she is a surgical candidate.  A possible treatment option would be restenting of the right coronary which seems to contain the most severe obstruction.  However it would likely be complicated because proximal to distal stenting would likely be necessary plus minus consideration of orbital atherectomy beyond the previously stented segment.  There would also likely be some difficulty delivering stents distal.  Unable to achieve optimal guide stability and ostium of right coronary due to subclavian/innominate/aortic tortuosity.  If RCA PCI considered, femoral approach might be more stable and predictable support for complex intervention.

## 2021-03-21 NOTE — H&P (View-Only) (Signed)
Progress Note  Patient Name: [name] Date of Encounter: 03/21/2021  [facility] Cardiologist: Rozann Lesches, MD   Subjective   No acute events overnight. No chest pain or shortness of breath. Tolerated medications well, with significant improvement in her BP. Pending AM meds currently. Reviewed plan for cath lab with patient and daughter, they wish to proceed.  Inpatient Medications    Scheduled Meds:  amiodarone  200 mg Oral Once per day on Mon Tue Wed Thu Fri Sat   amLODipine  10 mg Oral Daily   aspirin EC  81 mg Oral Daily   empagliflozin  10 mg Oral Daily   irbesartan  150 mg Oral Daily   isosorbide-hydrALAZINE  1 tablet Oral TID   metoprolol succinate  50 mg Oral QHS   metoprolol succinate  75 mg Oral Daily   pantoprazole  40 mg Oral Daily   primidone  50 mg Oral QHS   rosuvastatin  20 mg Oral QHS   sodium chloride flush  3 mL Intravenous Q12H   Continuous Infusions:  sodium chloride     sodium chloride 1 mL/kg/hr (03/21/21 0827)   heparin 1,100 Units/hr (03/21/21 0317)   nitroGLYCERIN Stopped (03/20/21 0206)   PRN Meds: sodium chloride, acetaminophen, ALPRAZolam, oxyCODONE, sodium chloride flush   Vital Signs    Vitals:   03/21/21 0317 03/21/21 0623 03/21/21 0757 03/21/21 0800  BP: (!) 165/78  (!) 181/79 (!) 174/86  Pulse: 68  61 60  Resp: 19  (!) 26 19  Temp: 97.7 F (36.5 C)  97.9 F (36.6 C)   TempSrc: Oral  Oral   SpO2: 96%  95% 95%  Weight:  60.5 kg    Height:  '5\' 5"'$  (1.651 m)      Intake/Output Summary (Last 24 hours) at 03/21/2021 0906 Last data filed at 03/21/2021 0317 Gross per 24 hour  Intake 609.02 ml  Output 650 ml  Net -40.98 ml   Last 3 Weights 03/21/2021 03/04/2021 09/03/2020  Weight (lbs) 133 lb 6.1 oz 141 lb 6.4 oz 146 lb  Weight (kg) 60.5 kg 64.139 kg 66.225 kg  Some encounter information is confidential and restricted. Go to Review Flowsheets activity to see all data.      Telemetry    Atrial paced at 60 bpm -  Personally Reviewed  ECG    03/21/21 atrial paced at 60 bpm - Personally Reviewed  Physical Exam   GEN: No acute distress.  Hard of hearing Neck: No JVD Cardiac: RRR, no murmurs, rubs, or gallops.  Respiratory: Clear to auscultation bilaterally. GI: Soft, nontender, non-distended  MS: No edema; No deformity. Neuro:  Nonfocal  Psych: Normal affect   Labs    High Sensitivity Troponin:   Recent Labs  Lab 03/19/21 2050 03/19/21 2320  TROPONINIHS 2,030* 1,869*      Chemistry Recent Labs  Lab 03/19/21 2050 03/20/21 0336 03/21/21 0215  NA 138 139 137  K 3.4* 4.4 4.4  CL 107 105 102  CO2 '27 25 28  '$ GLUCOSE 103* 95 89  BUN '11 12 13  '$ CREATININE 0.64 0.65 0.68  CALCIUM 8.9 8.9 9.5  PROT 8.1  --   --   ALBUMIN 3.9  --   --   AST 28  --   --   ALT 20  --   --   ALKPHOS 85  --   --   BILITOT 0.4  --   --   GFRNONAA >60 >60 >60  ANIONGAP 4*  9 7     Hematology Recent Labs  Lab 03/19/21 2050 03/20/21 0336 03/21/21 0215  WBC 8.6 7.9 7.7  RBC 4.17 3.93 4.14  HGB 13.0 12.0 12.5  HCT 40.3 38.0 39.6  MCV 96.6 96.7 95.7  MCH 31.2 30.5 30.2  MCHC 32.3 31.6 31.6  RDW 13.8 13.7 14.0  PLT 216 200 206    BNP Recent Labs  Lab 03/19/21 2320  BNP 295.0*     DDimer No results for input(s): DDIMER in the last 168 hours.   Radiology    DG Chest 2 View  Result Date: 03/19/2021 CLINICAL DATA:  Shortness of breath EXAM: CHEST - 2 VIEW COMPARISON:  05/25/2017 FINDINGS: Increased interstitial markings. No focal consolidation or frank interstitial edema. No pleural effusion or pneumothorax. Cardiomegaly.  Left subclavian pacemaker. Visualized osseous structures are within normal limits. IMPRESSION: Cardiomegaly.  No frank interstitial edema. Electronically Signed   By: Julian Hy M.D.   On: 03/19/2021 21:47   ECHOCARDIOGRAM COMPLETE  Result Date: 03/20/2021    ECHOCARDIOGRAM REPORT   Patient Name:   [name] Date of Exam: 03/20/2021 Medical Rec #:  [id number]     Height:       65.5 in Accession #:    [id number]   Weight:       141.4 lb Date of Birth:  11-09-1935    BSA:          1.717 m Patient Age:    85 years     BP:           198/90 mmHg Patient Gender: F            HR:           60 bpm. Exam Location:  Inpatient Procedure: 2D Echo, 3D Echo, Cardiac Doppler and Color Doppler Indications:    R07.9* Chest pain, unspecified  History:        Patient has prior history of Echocardiogram examinations, most                 recent 06/01/2017. CAD, Abnormal ECG, Arrythmias:Atrial                 Fibrillation and Bradycardia, Signs/Symptoms:Chest Pain; Risk                 Factors:Hypertension and Dyslipidemia.  Sonographer:    Roseanna Rainbow RDCS Referring Phys: [id number] [facility]  Sonographer Comments: Technically difficult study due to poor echo windows. Patient in high fowler's position. IMPRESSIONS  1. Left ventricular ejection fraction, by estimation, is 55 to 60%. The left ventricle has normal function. The left ventricle has no regional wall motion abnormalities. Left ventricular diastolic parameters are consistent with Grade II diastolic dysfunction (pseudonormalization). Elevated left ventricular end-diastolic pressure.  2. Right ventricular systolic function is normal. The right ventricular size is normal. There is normal pulmonary artery systolic pressure.  3. Left atrial size was moderately dilated.  4. The mitral valve is degenerative. Mild mitral valve regurgitation. No evidence of mitral stenosis.  5. There is partial fusion of the left and non-coronary cusps. The aortic valve is calcified. There is mild calcification of the aortic valve. There is mild thickening of the aortic valve. Aortic valve regurgitation is mild. Mild aortic valve sclerosis is present, with no evidence of aortic valve stenosis. Aortic regurgitation PHT measures 582 msec. Aortic valve area, by VTI measures 2.49 cm. Aortic valve mean gradient measures 3.5 mmHg. Aortic valve Vmax measures 1.23 m/s.  6.  Aortic dilatation noted. There is mild dilatation of the aortic root, measuring 37 mm. There is mild dilatation of the ascending aorta, measuring 38 mm.  7. The inferior vena cava is normal in size with greater than 50% respiratory variability, suggesting right atrial pressure of 3 mmHg. FINDINGS  Left Ventricle: Left ventricular ejection fraction, by estimation, is 55 to 60%. The left ventricle has normal function. The left ventricle has no regional wall motion abnormalities. The left ventricular internal cavity size was normal in size. There is  no left ventricular hypertrophy. Left ventricular diastolic parameters are consistent with Grade II diastolic dysfunction (pseudonormalization). Elevated left ventricular end-diastolic pressure. Right Ventricle: The right ventricular size is normal. No increase in right ventricular wall thickness. Right ventricular systolic function is normal. There is normal pulmonary artery systolic pressure. The tricuspid regurgitant velocity is 2.64 m/s, and  with an assumed right atrial pressure of 3 mmHg, the estimated right ventricular systolic pressure is 123456 mmHg. Left Atrium: Left atrial size was moderately dilated. Right Atrium: Right atrial size was normal in size. Pericardium: There is no evidence of pericardial effusion. Mitral Valve: The mitral valve is degenerative in appearance. There is moderate thickening of the mitral valve leaflet(s). There is moderate calcification of the mitral valve leaflet(s). Mild to moderate mitral annular calcification. Mild mitral valve regurgitation. No evidence of mitral valve stenosis. The mean mitral valve gradient is 1.3 mmHg. Tricuspid Valve: The tricuspid valve is normal in structure. Tricuspid valve regurgitation is mild . No evidence of tricuspid stenosis. Aortic Valve: There is partial fusion of the left and non-coronary cusps. The aortic valve is calcified. There is mild calcification of the aortic valve. There is mild thickening of  the aortic valve. Aortic valve regurgitation is mild. Aortic regurgitation PHT measures 582 msec. Mild aortic valve sclerosis is present, with no evidence of aortic valve stenosis. Aortic valve mean gradient measures 3.5 mmHg. Aortic valve peak gradient measures 6.1 mmHg. Aortic valve area, by VTI measures 2.49 cm. Pulmonic Valve: The pulmonic valve was normal in structure. Pulmonic valve regurgitation is trivial. No evidence of pulmonic stenosis. Aorta: Aortic dilatation noted. There is mild dilatation of the aortic root, measuring 37 mm. There is mild dilatation of the ascending aorta, measuring 38 mm. Venous: The inferior vena cava is normal in size with greater than 50% respiratory variability, suggesting right atrial pressure of 3 mmHg. IAS/Shunts: No atrial level shunt detected by color flow Doppler. Additional Comments: A device lead is visualized.  LEFT VENTRICLE PLAX 2D LVIDd:         3.80 cm      Diastology LVIDs:         2.70 cm      LV e' medial:    3.68 cm/s LV PW:         0.95 cm      LV E/e' medial:  22.6 LV IVS:        1.40 cm      LV e' lateral:   5.48 cm/s LVOT diam:     1.80 cm      LV E/e' lateral: 15.2 LV SV:         68 LV SV Index:   40 LVOT Area:     2.54 cm                              3D Volume EF: LV Volumes (MOD)  3D EF:        51 % LV vol d, MOD A2C: 115.0 ml LV EDV:       94 ml LV vol d, MOD A4C: 55.6 ml  LV ESV:       46 ml LV vol s, MOD A2C: 53.5 ml  LV SV:        48 ml LV vol s, MOD A4C: 22.5 ml LV SV MOD A2C:     61.5 ml LV SV MOD A4C:     55.6 ml LV SV MOD BP:      49.0 ml RIGHT VENTRICLE            IVC RV S prime:     9.38 cm/s  IVC diam: 1.80 cm TAPSE (M-mode): 2.1 cm LEFT ATRIUM             Index       RIGHT ATRIUM           Index LA diam:        3.30 cm 1.92 cm/m  RA Area:     17.80 cm LA Vol (A2C):   55.2 ml 32.15 ml/m RA Volume:   47.10 ml  27.43 ml/m LA Vol (A4C):   56.6 ml 32.97 ml/m LA Biplane Vol: 59.4 ml 34.60 ml/m  AORTIC VALVE AV Area (Vmax):    2.79  cm AV Area (Vmean):   2.39 cm AV Area (VTI):     2.49 cm AV Vmax:           123.50 cm/s AV Vmean:          82.400 cm/s AV VTI:            0.274 m AV Peak Grad:      6.1 mmHg AV Mean Grad:      3.5 mmHg LVOT Vmax:         135.50 cm/s LVOT Vmean:        77.550 cm/s LVOT VTI:          0.268 m LVOT/AV VTI ratio: 0.98 AI PHT:            582 msec  AORTA Ao Root diam: 3.70 cm Ao Asc diam:  3.80 cm MITRAL VALVE               TRICUSPID VALVE MV Area (PHT): 3.08 cm    TR Peak grad:   27.9 mmHg MV Mean grad:  1.3 mmHg    TR Vmax:        264.00 cm/s MV Decel Time: 246 msec MV E velocity: 83.10 cm/s  SHUNTS MV A velocity: 60.40 cm/s  Systemic VTI:  0.27 m MV E/A ratio:  1.38        Systemic Diam: 1.80 cm Skeet Latch MD Electronically signed by Skeet Latch MD Signature Date/Time: 03/20/2021/5:08:48 PM    Final    CT Angio Chest/Abd/Pel for Dissection W and/or W/WO  Result Date: 03/20/2021 CLINICAL DATA:  Hypertension, back pain EXAM: CT ANGIOGRAPHY CHEST, ABDOMEN AND PELVIS TECHNIQUE: Non-contrast CT of the chest was initially obtained. Multidetector CT imaging through the chest, abdomen and pelvis was performed using the standard protocol during bolus administration of intravenous contrast. Multiplanar reconstructed images and MIPs were obtained and reviewed to evaluate the vascular anatomy. CONTRAST:  12m OMNIPAQUE IOHEXOL 350 MG/ML SOLN COMPARISON:  CTA chest, CT abdomen/pelvis dated 05/25/2017 FINDINGS: CTA CHEST FINDINGS Cardiovascular: On unenhanced CT, there is no evidence of intramural hematoma. Preferential opacification of the thoracic  aorta. No evidence of thoracic aortic aneurysm or dissection. Atherosclerotic calcifications of the aortic arch. Although not tailored for evaluation of the pulmonary arteries, there is no evidence of central pulmonary embolism. The heart is top-normal in size. No pericardial effusion. Left subclavian pacemaker. Three vessel coronary atherosclerosis. Mediastinum/Nodes:  No suspicious mediastinal lymphadenopathy. Visualized thyroid is unremarkable. Lungs/Pleura: Biapical pleural-parenchymal scarring. Mild centrilobular and paraseptal emphysematous changes, upper lung predominant. No suspicious pulmonary nodules. No focal consolidation. No pleural effusion or pneumothorax. Musculoskeletal: Mild degenerative changes of the mid/lower thoracic spine. Review of the MIP images confirms the above findings. CTA ABDOMEN AND PELVIS FINDINGS VASCULAR Aorta: No evidence abdominal aortic aneurysm or dissection. Atherosclerotic calcifications. Patent. Celiac: Atherosclerotic calcifications at the origin.  Patent. SMA: Atherosclerotic calcifications at the origin.  Patent. Renals: Patent bilaterally.  Atherosclerotic calcifications. IMA: Patent. Inflow: Patent bilaterally.  Atherosclerotic calcifications. Veins: Unremarkable. Review of the MIP images confirms the above findings. NON-VASCULAR Hepatobiliary: Liver is within normal limits. Status post cholecystectomy. No intrahepatic or extrahepatic ductal dilatation. Pancreas: 11 mm low-density lesion in the pancreatic head (series 5/image 97), new. Mild prominence of the main pancreatic duct, measuring 4-5 mm (series 5/image 91). Spleen: Coarse splenic granuloma. Adrenals/Urinary Tract: Adrenal glands are within normal limits. Scarring in the posterior left lower kidney. Right kidney is within normal limits. No hydronephrosis. Bladder is within normal limits. Stomach/Bowel: Stomach is within normal limits. No evidence of bowel obstruction. Appendix is not discretely visualized. Lymphatic: No suspicious abdominopelvic lymphadenopathy. Reproductive: Status post hysterectomy. Bilateral ovaries are within normal limits. Other: No abdominopelvic ascites. Musculoskeletal: Mild degenerative changes of the lumbar spine. Review of the MIP images confirms the above findings. IMPRESSION: No evidence of thoracoabdominal aortic aneurysm or dissection. 11 mm  low-density lesion in the pancreatic head, new. This is poorly evaluated on CT. Follow-up MRI abdomen with/without contrast is suggested in 4 weeks (on an outpatient basis). Electronically Signed   By: Julian Hy M.D.   On: 03/20/2021 00:38    Cardiac Studies   Echo 03/20/21  1. Left ventricular ejection fraction, by estimation, is 55 to 60%. The left ventricle has normal function. The left ventricle has no regional wall motion abnormalities. Left ventricular diastolic parameters are consistent with Grade II diastolic dysfunction (pseudonormalization). Elevated left ventricular end-diastolic pressure.   2. Right ventricular systolic function is normal. The right ventricular size is normal. There is normal pulmonary artery systolic pressure.   3. Left atrial size was moderately dilated.   4. The mitral valve is degenerative. Mild mitral valve regurgitation. No evidence of mitral stenosis.   5. There is partial fusion of the left and non-coronary cusps. The aortic valve is calcified. There is mild calcification of the aortic valve. There is mild thickening of the aortic valve. Aortic valve regurgitation is mild. Mild aortic valve sclerosis  is present, with no evidence of aortic valve stenosis. Aortic regurgitation PHT measures 582 msec. Aortic valve area, by VTI measures 2.49 cm. Aortic valve mean gradient measures 3.5 mmHg. Aortic valve Vmax measures 1.23 m/s.   6. Aortic dilatation noted. There is mild dilatation of the aortic root, measuring 37 mm. There is mild dilatation of the ascending aorta, measuring 38 mm.   7. The inferior vena cava is normal in size with greater than 50% respiratory variability, suggesting right atrial pressure of 3 mmHg.   Patient Profile     85 y.o. female with PMH CAD with remote DES to RCA, paroxysmal atrial fibrillation not on anticoagulation per patient preference, chronic  diastolic heart failure, tachybrady syndrome s/p permanent pacemaker, hypertension,  hyperlipidemia who we are seeing in consultation for chest pain and elevated troponins at the request of Dr. Myna Hidalgo.  Assessment & Plan    Chest pain: resolved  Hypertensive urgency vs. Emergency:  -hsTn >2000, with chest pain. Unclear if primary ACS/NSTEMI vs. Hypertensive emergency. Pending cath this morning.  -Echo without wall motion abnormalities -started amlodipine, hydralazine-isosorbide this admission, in addition to her home irbesartan and metoprolol  Chronic diastolic heart failure: on room air, appears euvolemic -continue empagliflozin -echo with normal right sided pressure  Atrial fibrillation, declined anticoagulation Tachy-brady syndrome s/p PPM -continue amiodarone  For questions or updates, please contact [facility] Please consult [email or web address] for contact info under     Signed, Buford Dresser, MD  03/21/2021, 9:06 AM

## 2021-03-21 NOTE — Progress Notes (Signed)
ANTICOAGULATION CONSULT NOTE   Pharmacy Consult for Heparin  Indication: chest pain/ACS  Allergies  Allergen Reactions   Amitriptyline Hcl Other (See Comments)    Caused "jaws to twist and lock"   Sulfonamide Derivatives Other (See Comments)    UNKNOWN REACTION    Vital Signs: Temp: 97.8 F (36.6 C) (08/18 1059) Temp Source: Oral (08/18 1059) BP: 148/67 (08/18 1059) Pulse Rate: 60 (08/18 1059)  Labs: Recent Labs    03/19/21 2050 03/19/21 2320 03/20/21 0336 03/20/21 0712 03/20/21 1605 03/21/21 0215  HGB 13.0  --  12.0  --   --  12.5  HCT 40.3  --  38.0  --   --  39.6  PLT 216  --  200  --   --  206  HEPARINUNFRC  --   --   --  0.18* 0.21* 0.25*  CREATININE 0.64  --  0.65  --   --  0.68  TROPONINIHS 2,030* 1,869*  --   --   --   --      Estimated Creatinine Clearance: 47.9 mL/min (by C-G formula based on SCr of 0.68 mg/dL).   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 Consulted to start heparin. Pt does have a history of afib but has not been on any anti-coagulation. CBC/renal function good. PTA meds reviewed.   S/p cath with 3v CAD. Orders to resume heparin this afternoon  Goal of Therapy:  Heparin level 0.3-0.7 units/ml Monitor platelets by anticoagulation protocol: Yes    Plan:  Restart heparin at 1100 units/hr, 1830 tonight 8 hour heparin level  Erin Hearing PharmD., BCPS Clinical Pharmacist 03/21/2021 11:09 AM

## 2021-03-21 NOTE — Progress Notes (Signed)
Progress Note  Patient Name: [name] Date of Encounter: 03/21/2021  [facility] Cardiologist: Rozann Lesches, MD   Subjective   No acute events overnight. No chest pain or shortness of breath. Tolerated medications well, with significant improvement in her BP. Pending AM meds currently. Reviewed plan for cath lab with patient and daughter, they wish to proceed.  Inpatient Medications    Scheduled Meds:  amiodarone  200 mg Oral Once per day on Mon Tue Wed Thu Fri Sat   amLODipine  10 mg Oral Daily   aspirin EC  81 mg Oral Daily   empagliflozin  10 mg Oral Daily   irbesartan  150 mg Oral Daily   isosorbide-hydrALAZINE  1 tablet Oral TID   metoprolol succinate  50 mg Oral QHS   metoprolol succinate  75 mg Oral Daily   pantoprazole  40 mg Oral Daily   primidone  50 mg Oral QHS   rosuvastatin  20 mg Oral QHS   sodium chloride flush  3 mL Intravenous Q12H   Continuous Infusions:  sodium chloride     sodium chloride 1 mL/kg/hr (03/21/21 0827)   heparin 1,100 Units/hr (03/21/21 0317)   nitroGLYCERIN Stopped (03/20/21 0206)   PRN Meds: sodium chloride, acetaminophen, ALPRAZolam, oxyCODONE, sodium chloride flush   Vital Signs    Vitals:   03/21/21 0317 03/21/21 0623 03/21/21 0757 03/21/21 0800  BP: (!) 165/78  (!) 181/79 (!) 174/86  Pulse: 68  61 60  Resp: 19  (!) 26 19  Temp: 97.7 F (36.5 C)  97.9 F (36.6 C)   TempSrc: Oral  Oral   SpO2: 96%  95% 95%  Weight:  60.5 kg    Height:  '5\' 5"'$  (1.651 m)      Intake/Output Summary (Last 24 hours) at 03/21/2021 0906 Last data filed at 03/21/2021 0317 Gross per 24 hour  Intake 609.02 ml  Output 650 ml  Net -40.98 ml   Last 3 Weights 03/21/2021 03/04/2021 09/03/2020  Weight (lbs) 133 lb 6.1 oz 141 lb 6.4 oz 146 lb  Weight (kg) 60.5 kg 64.139 kg 66.225 kg  Some encounter information is confidential and restricted. Go to Review Flowsheets activity to see all data.      Telemetry    Atrial paced at 60 bpm -  Personally Reviewed  ECG    03/21/21 atrial paced at 60 bpm - Personally Reviewed  Physical Exam   GEN: No acute distress.  Hard of hearing Neck: No JVD Cardiac: RRR, no murmurs, rubs, or gallops.  Respiratory: Clear to auscultation bilaterally. GI: Soft, nontender, non-distended  MS: No edema; No deformity. Neuro:  Nonfocal  Psych: Normal affect   Labs    High Sensitivity Troponin:   Recent Labs  Lab 03/19/21 2050 03/19/21 2320  TROPONINIHS 2,030* 1,869*      Chemistry Recent Labs  Lab 03/19/21 2050 03/20/21 0336 03/21/21 0215  NA 138 139 137  K 3.4* 4.4 4.4  CL 107 105 102  CO2 '27 25 28  '$ GLUCOSE 103* 95 89  BUN '11 12 13  '$ CREATININE 0.64 0.65 0.68  CALCIUM 8.9 8.9 9.5  PROT 8.1  --   --   ALBUMIN 3.9  --   --   AST 28  --   --   ALT 20  --   --   ALKPHOS 85  --   --   BILITOT 0.4  --   --   GFRNONAA >60 >60 >60  ANIONGAP 4*  9 7     Hematology Recent Labs  Lab 03/19/21 2050 03/20/21 0336 03/21/21 0215  WBC 8.6 7.9 7.7  RBC 4.17 3.93 4.14  HGB 13.0 12.0 12.5  HCT 40.3 38.0 39.6  MCV 96.6 96.7 95.7  MCH 31.2 30.5 30.2  MCHC 32.3 31.6 31.6  RDW 13.8 13.7 14.0  PLT 216 200 206    BNP Recent Labs  Lab 03/19/21 2320  BNP 295.0*     DDimer No results for input(s): DDIMER in the last 168 hours.   Radiology    DG Chest 2 View  Result Date: 03/19/2021 CLINICAL DATA:  Shortness of breath EXAM: CHEST - 2 VIEW COMPARISON:  05/25/2017 FINDINGS: Increased interstitial markings. No focal consolidation or frank interstitial edema. No pleural effusion or pneumothorax. Cardiomegaly.  Left subclavian pacemaker. Visualized osseous structures are within normal limits. IMPRESSION: Cardiomegaly.  No frank interstitial edema. Electronically Signed   By: Julian Hy M.D.   On: 03/19/2021 21:47   ECHOCARDIOGRAM COMPLETE  Result Date: 03/20/2021    ECHOCARDIOGRAM REPORT   Patient Name:   [name] Date of Exam: 03/20/2021 Medical Rec #:  [id number]     Height:       65.5 in Accession #:    [id number]   Weight:       141.4 lb Date of Birth:  01-Oct-1935    BSA:          1.717 m Patient Age:    85 years     BP:           198/90 mmHg Patient Gender: F            HR:           60 bpm. Exam Location:  Inpatient Procedure: 2D Echo, 3D Echo, Cardiac Doppler and Color Doppler Indications:    R07.9* Chest pain, unspecified  History:        Patient has prior history of Echocardiogram examinations, most                 recent 06/01/2017. CAD, Abnormal ECG, Arrythmias:Atrial                 Fibrillation and Bradycardia, Signs/Symptoms:Chest Pain; Risk                 Factors:Hypertension and Dyslipidemia.  Sonographer:    Roseanna Rainbow RDCS Referring Phys: [id number] [facility]  Sonographer Comments: Technically difficult study due to poor echo windows. Patient in high fowler's position. IMPRESSIONS  1. Left ventricular ejection fraction, by estimation, is 55 to 60%. The left ventricle has normal function. The left ventricle has no regional wall motion abnormalities. Left ventricular diastolic parameters are consistent with Grade II diastolic dysfunction (pseudonormalization). Elevated left ventricular end-diastolic pressure.  2. Right ventricular systolic function is normal. The right ventricular size is normal. There is normal pulmonary artery systolic pressure.  3. Left atrial size was moderately dilated.  4. The mitral valve is degenerative. Mild mitral valve regurgitation. No evidence of mitral stenosis.  5. There is partial fusion of the left and non-coronary cusps. The aortic valve is calcified. There is mild calcification of the aortic valve. There is mild thickening of the aortic valve. Aortic valve regurgitation is mild. Mild aortic valve sclerosis is present, with no evidence of aortic valve stenosis. Aortic regurgitation PHT measures 582 msec. Aortic valve area, by VTI measures 2.49 cm. Aortic valve mean gradient measures 3.5 mmHg. Aortic valve Vmax measures 1.23 m/s.  6.  Aortic dilatation noted. There is mild dilatation of the aortic root, measuring 37 mm. There is mild dilatation of the ascending aorta, measuring 38 mm.  7. The inferior vena cava is normal in size with greater than 50% respiratory variability, suggesting right atrial pressure of 3 mmHg. FINDINGS  Left Ventricle: Left ventricular ejection fraction, by estimation, is 55 to 60%. The left ventricle has normal function. The left ventricle has no regional wall motion abnormalities. The left ventricular internal cavity size was normal in size. There is  no left ventricular hypertrophy. Left ventricular diastolic parameters are consistent with Grade II diastolic dysfunction (pseudonormalization). Elevated left ventricular end-diastolic pressure. Right Ventricle: The right ventricular size is normal. No increase in right ventricular wall thickness. Right ventricular systolic function is normal. There is normal pulmonary artery systolic pressure. The tricuspid regurgitant velocity is 2.64 m/s, and  with an assumed right atrial pressure of 3 mmHg, the estimated right ventricular systolic pressure is 123456 mmHg. Left Atrium: Left atrial size was moderately dilated. Right Atrium: Right atrial size was normal in size. Pericardium: There is no evidence of pericardial effusion. Mitral Valve: The mitral valve is degenerative in appearance. There is moderate thickening of the mitral valve leaflet(s). There is moderate calcification of the mitral valve leaflet(s). Mild to moderate mitral annular calcification. Mild mitral valve regurgitation. No evidence of mitral valve stenosis. The mean mitral valve gradient is 1.3 mmHg. Tricuspid Valve: The tricuspid valve is normal in structure. Tricuspid valve regurgitation is mild . No evidence of tricuspid stenosis. Aortic Valve: There is partial fusion of the left and non-coronary cusps. The aortic valve is calcified. There is mild calcification of the aortic valve. There is mild thickening of  the aortic valve. Aortic valve regurgitation is mild. Aortic regurgitation PHT measures 582 msec. Mild aortic valve sclerosis is present, with no evidence of aortic valve stenosis. Aortic valve mean gradient measures 3.5 mmHg. Aortic valve peak gradient measures 6.1 mmHg. Aortic valve area, by VTI measures 2.49 cm. Pulmonic Valve: The pulmonic valve was normal in structure. Pulmonic valve regurgitation is trivial. No evidence of pulmonic stenosis. Aorta: Aortic dilatation noted. There is mild dilatation of the aortic root, measuring 37 mm. There is mild dilatation of the ascending aorta, measuring 38 mm. Venous: The inferior vena cava is normal in size with greater than 50% respiratory variability, suggesting right atrial pressure of 3 mmHg. IAS/Shunts: No atrial level shunt detected by color flow Doppler. Additional Comments: A device lead is visualized.  LEFT VENTRICLE PLAX 2D LVIDd:         3.80 cm      Diastology LVIDs:         2.70 cm      LV e' medial:    3.68 cm/s LV PW:         0.95 cm      LV E/e' medial:  22.6 LV IVS:        1.40 cm      LV e' lateral:   5.48 cm/s LVOT diam:     1.80 cm      LV E/e' lateral: 15.2 LV SV:         68 LV SV Index:   40 LVOT Area:     2.54 cm                              3D Volume EF: LV Volumes (MOD)  3D EF:        51 % LV vol d, MOD A2C: 115.0 ml LV EDV:       94 ml LV vol d, MOD A4C: 55.6 ml  LV ESV:       46 ml LV vol s, MOD A2C: 53.5 ml  LV SV:        48 ml LV vol s, MOD A4C: 22.5 ml LV SV MOD A2C:     61.5 ml LV SV MOD A4C:     55.6 ml LV SV MOD BP:      49.0 ml RIGHT VENTRICLE            IVC RV S prime:     9.38 cm/s  IVC diam: 1.80 cm TAPSE (M-mode): 2.1 cm LEFT ATRIUM             Index       RIGHT ATRIUM           Index LA diam:        3.30 cm 1.92 cm/m  RA Area:     17.80 cm LA Vol (A2C):   55.2 ml 32.15 ml/m RA Volume:   47.10 ml  27.43 ml/m LA Vol (A4C):   56.6 ml 32.97 ml/m LA Biplane Vol: 59.4 ml 34.60 ml/m  AORTIC VALVE AV Area (Vmax):    2.79  cm AV Area (Vmean):   2.39 cm AV Area (VTI):     2.49 cm AV Vmax:           123.50 cm/s AV Vmean:          82.400 cm/s AV VTI:            0.274 m AV Peak Grad:      6.1 mmHg AV Mean Grad:      3.5 mmHg LVOT Vmax:         135.50 cm/s LVOT Vmean:        77.550 cm/s LVOT VTI:          0.268 m LVOT/AV VTI ratio: 0.98 AI PHT:            582 msec  AORTA Ao Root diam: 3.70 cm Ao Asc diam:  3.80 cm MITRAL VALVE               TRICUSPID VALVE MV Area (PHT): 3.08 cm    TR Peak grad:   27.9 mmHg MV Mean grad:  1.3 mmHg    TR Vmax:        264.00 cm/s MV Decel Time: 246 msec MV E velocity: 83.10 cm/s  SHUNTS MV A velocity: 60.40 cm/s  Systemic VTI:  0.27 m MV E/A ratio:  1.38        Systemic Diam: 1.80 cm Skeet Latch MD Electronically signed by Skeet Latch MD Signature Date/Time: 03/20/2021/5:08:48 PM    Final    CT Angio Chest/Abd/Pel for Dissection W and/or W/WO  Result Date: 03/20/2021 CLINICAL DATA:  Hypertension, back pain EXAM: CT ANGIOGRAPHY CHEST, ABDOMEN AND PELVIS TECHNIQUE: Non-contrast CT of the chest was initially obtained. Multidetector CT imaging through the chest, abdomen and pelvis was performed using the standard protocol during bolus administration of intravenous contrast. Multiplanar reconstructed images and MIPs were obtained and reviewed to evaluate the vascular anatomy. CONTRAST:  147m OMNIPAQUE IOHEXOL 350 MG/ML SOLN COMPARISON:  CTA chest, CT abdomen/pelvis dated 05/25/2017 FINDINGS: CTA CHEST FINDINGS Cardiovascular: On unenhanced CT, there is no evidence of intramural hematoma. Preferential opacification of the thoracic  aorta. No evidence of thoracic aortic aneurysm or dissection. Atherosclerotic calcifications of the aortic arch. Although not tailored for evaluation of the pulmonary arteries, there is no evidence of central pulmonary embolism. The heart is top-normal in size. No pericardial effusion. Left subclavian pacemaker. Three vessel coronary atherosclerosis. Mediastinum/Nodes:  No suspicious mediastinal lymphadenopathy. Visualized thyroid is unremarkable. Lungs/Pleura: Biapical pleural-parenchymal scarring. Mild centrilobular and paraseptal emphysematous changes, upper lung predominant. No suspicious pulmonary nodules. No focal consolidation. No pleural effusion or pneumothorax. Musculoskeletal: Mild degenerative changes of the mid/lower thoracic spine. Review of the MIP images confirms the above findings. CTA ABDOMEN AND PELVIS FINDINGS VASCULAR Aorta: No evidence abdominal aortic aneurysm or dissection. Atherosclerotic calcifications. Patent. Celiac: Atherosclerotic calcifications at the origin.  Patent. SMA: Atherosclerotic calcifications at the origin.  Patent. Renals: Patent bilaterally.  Atherosclerotic calcifications. IMA: Patent. Inflow: Patent bilaterally.  Atherosclerotic calcifications. Veins: Unremarkable. Review of the MIP images confirms the above findings. NON-VASCULAR Hepatobiliary: Liver is within normal limits. Status post cholecystectomy. No intrahepatic or extrahepatic ductal dilatation. Pancreas: 11 mm low-density lesion in the pancreatic head (series 5/image 97), new. Mild prominence of the main pancreatic duct, measuring 4-5 mm (series 5/image 91). Spleen: Coarse splenic granuloma. Adrenals/Urinary Tract: Adrenal glands are within normal limits. Scarring in the posterior left lower kidney. Right kidney is within normal limits. No hydronephrosis. Bladder is within normal limits. Stomach/Bowel: Stomach is within normal limits. No evidence of bowel obstruction. Appendix is not discretely visualized. Lymphatic: No suspicious abdominopelvic lymphadenopathy. Reproductive: Status post hysterectomy. Bilateral ovaries are within normal limits. Other: No abdominopelvic ascites. Musculoskeletal: Mild degenerative changes of the lumbar spine. Review of the MIP images confirms the above findings. IMPRESSION: No evidence of thoracoabdominal aortic aneurysm or dissection. 11 mm  low-density lesion in the pancreatic head, new. This is poorly evaluated on CT. Follow-up MRI abdomen with/without contrast is suggested in 4 weeks (on an outpatient basis). Electronically Signed   By: Julian Hy M.D.   On: 03/20/2021 00:38    Cardiac Studies   Echo 03/20/21  1. Left ventricular ejection fraction, by estimation, is 55 to 60%. The left ventricle has normal function. The left ventricle has no regional wall motion abnormalities. Left ventricular diastolic parameters are consistent with Grade II diastolic dysfunction (pseudonormalization). Elevated left ventricular end-diastolic pressure.   2. Right ventricular systolic function is normal. The right ventricular size is normal. There is normal pulmonary artery systolic pressure.   3. Left atrial size was moderately dilated.   4. The mitral valve is degenerative. Mild mitral valve regurgitation. No evidence of mitral stenosis.   5. There is partial fusion of the left and non-coronary cusps. The aortic valve is calcified. There is mild calcification of the aortic valve. There is mild thickening of the aortic valve. Aortic valve regurgitation is mild. Mild aortic valve sclerosis  is present, with no evidence of aortic valve stenosis. Aortic regurgitation PHT measures 582 msec. Aortic valve area, by VTI measures 2.49 cm. Aortic valve mean gradient measures 3.5 mmHg. Aortic valve Vmax measures 1.23 m/s.   6. Aortic dilatation noted. There is mild dilatation of the aortic root, measuring 37 mm. There is mild dilatation of the ascending aorta, measuring 38 mm.   7. The inferior vena cava is normal in size with greater than 50% respiratory variability, suggesting right atrial pressure of 3 mmHg.   Patient Profile     85 y.o. female with PMH CAD with remote DES to RCA, paroxysmal atrial fibrillation not on anticoagulation per patient preference, chronic  diastolic heart failure, tachybrady syndrome s/p permanent pacemaker, hypertension,  hyperlipidemia who we are seeing in consultation for chest pain and elevated troponins at the request of Dr. Myna Hidalgo.  Assessment & Plan    Chest pain: resolved  Hypertensive urgency vs. Emergency:  -hsTn >2000, with chest pain. Unclear if primary ACS/NSTEMI vs. Hypertensive emergency. Pending cath this morning.  -Echo without wall motion abnormalities -started amlodipine, hydralazine-isosorbide this admission, in addition to her home irbesartan and metoprolol  Chronic diastolic heart failure: on room air, appears euvolemic -continue empagliflozin -echo with normal right sided pressure  Atrial fibrillation, declined anticoagulation Tachy-brady syndrome s/p PPM -continue amiodarone  For questions or updates, please contact [facility] Please consult [email or web address] for contact info under     Signed, Buford Dresser, MD  03/21/2021, 9:06 AM

## 2021-03-21 NOTE — Interval H&P Note (Signed)
History and Physical Interval Note:  03/21/2021 10:02 AM  [name]  has presented today for surgery, with the diagnosis of nstemi.  The various methods of treatment have been discussed with the patient and family. After consideration of risks, benefits and other options for treatment, the patient has consented to  Procedure(s): LEFT HEART CATH AND CORONARY ANGIOGRAPHY (N/A) as a surgical intervention.  The patient's history has been reviewed, patient examined, no change in status, stable for surgery.  I have reviewed the patient's chart and labs.  Questions were answered to the patient's satisfaction.     [name]

## 2021-03-21 NOTE — Progress Notes (Signed)
PROGRESS NOTE    [name]  [id number] DOB: 1936/02/03 DOA: 03/19/2021 PCP: Celene Squibb, MD    Chief Complaint  Patient presents with   Hypertension    Brief Narrative:  CAD with DES to RCA in 2010, paroxysmal atrial fibrillation not on anticoagulation, tachybradycardia syndrome s/p [employer] PPM, chronic diastolic heart failure, HTN, HLD, anxiety, GERD, reports new sharp back pain, intermittent chest pain, found to have hypertension urgency and let elevated troponin, Transferred [facility] to [facility] due to elevated troponin Cardiology consulted, plan to cardiac cath in am  Subjective:  She is seen after returned from cardiac cath, she state some right sided chest pain, had back pain a while ago, currently on left side chest pain or back pain She remains on heparin drip, she is off nitrodrip family at bedside   Assessment & Plan:   Principal Problem:   CAD (coronary artery disease), native coronary artery Active Problems:   Anxiety   Atrial fibrillation ([place])   BRADYCARDIA-TACHYCARDIA SYNDROME   Chronic diastolic heart failure ([place])   Hypertensive crisis   Elevated troponin   Prolonged QT interval   Pancreatic lesion   Non-STEMI (non-ST elevated myocardial infarction) ([place])   Hypertensive urgency   Non-STEMI, with history of CAD status post DES to RCA in 2010 -presents with back pain/chest pain -No aortic aneurysm or dissection on CTA -Currently on heparin drip, aspirin, lipitor, Toprol-XL, appear off nitro drip today -Echocardiogram with preserved lvef and grade II diastolic dysfunction -cardiac cath today, plan per cardiology  Hypertension emergency Blood pressure improved on Norvasc, Avapro, BiDil, metoprolol XL -Continue adjust blood pressure medication as needed  Paroxysmal A. fib, history of tachybradycardia syndrome status post pacemaker -Sinus rhythm in the ED, currently paced rhythm,  -Patient refused anticoagulation in the past -Currently on  metoprolol/amiodarone -Management per cardiology  Chronic diastolic CHF -Does not appear volume overloaded on presentation, echo pending -Home Lasix currently on hold -will follow cardiology recommendation  Hypokalemia Replaced and normalized  QTC prolongation Improved on Repeat EKG ,  keep K above 4, mag above 2 Avoid QTC prolongation agent   Pancreatic lesion  - Noted incidentally on CT in ED  - Outpatient follow-up with MRI recommended, daughter was made aware    Anxiety On as needed Xanax at home  Constipation Stool softener       Unresulted Labs (From admission, onward)     Start     Ordered   03/23/21 0500  Heparin level (unfractionated)  Daily,   R     Question:  Specimen collection method  Answer:  Lab=Lab collect   03/21/21 1110   03/22/21 [id number]  Basic metabolic panel  Tomorrow morning,   R       Question:  Specimen collection method  Answer:  Lab=Lab collect   03/21/21 1124   03/22/21 0500  CBC  Tomorrow morning,   R       Question:  Specimen collection method  Answer:  Lab=Lab collect   03/21/21 1124   03/22/21 0300  Heparin level (unfractionated)  Once-Timed,   R       Question:  Specimen collection method  Answer:  Lab=Lab collect   03/21/21 1110   03/21/21 [id number]  Basic metabolic panel  Daily,   R      03/20/21 1305   03/21/21 0500  CBC  Daily,   R      03/20/21 1305  DVT prophylaxis:   Currently on heparin drip   Code Status: Full Family Communication: Daughter at bedside Disposition:   Status is: Inpatient   Dispo: The patient is from: Home              Anticipated d/c is to: Home              Anticipated d/c date is: TBD, need cardiology clearance                Consultants:  Cardiology  Procedures:  cardiac cath on 8/18  Antimicrobials:    None    Objective: Vitals:   03/21/21 1145 03/21/21 1200 03/21/21 1215 03/21/21 1300  BP: (!) 145/67 (!) 144/72 (!) 141/104 (!) 145/119  Pulse: 70 89 91 71  Resp:  20 20 (!) 21 (!) 22  Temp:    97.9 F (36.6 C)  TempSrc:    Oral  SpO2: 96% 91% 96% 94%  Weight:      Height:        Intake/Output Summary (Last 24 hours) at 03/21/2021 1336 Last data filed at 03/21/2021 0900 Gross per 24 hour  Intake 634.02 ml  Output 650 ml  Net -15.98 ml   Filed Weights   03/21/21 0623  Weight: 60.5 kg    Examination:  General exam: calm, NAD, pleasant, very hard of hearing Respiratory system: Clear to auscultation. Respiratory effort normal. Cardiovascular system: S1 & S2 heard, RRR. No JVD, no murmur, No pedal edema. Gastrointestinal system: Abdomen is nondistended, soft and nontender.  Normal bowel sounds heard. Central nervous system: Alert and oriented. No focal neurological deficits. Extremities: no edema Skin: No rashes, lesions or ulcers Psychiatry: Judgement and insight appear normal. Mood & affect appropriate.     Data Reviewed: I have personally reviewed following labs and imaging studies  CBC: Recent Labs  Lab 03/19/21 2050 03/20/21 0336 03/21/21 0215  WBC 8.6 7.9 7.7  NEUTROABS 5.4  --   --   HGB 13.0 12.0 12.5  HCT 40.3 38.0 39.6  MCV 96.6 96.7 95.7  PLT 216 200 [id number]    Basic Metabolic Panel: Recent Labs  Lab 03/19/21 2050 03/19/21 2320 03/20/21 0336 03/21/21 0215  NA 138  --  139 137  K 3.4*  --  4.4 4.4  CL 107  --  105 102  CO2 27  --  25 28  GLUCOSE 103*  --  95 89  BUN 11  --  12 13  CREATININE 0.64  --  0.65 0.68  CALCIUM 8.9  --  8.9 9.5  MG  --  2.3 2.2  --     GFR: Estimated Creatinine Clearance: 47.9 mL/min (by C-G formula based on SCr of 0.68 mg/dL).  Liver Function Tests: Recent Labs  Lab 03/19/21 2050  AST 28  ALT 20  ALKPHOS 85  BILITOT 0.4  PROT 8.1  ALBUMIN 3.9    CBG: No results for input(s): GLUCAP in the last 168 hours.   Recent Results (from the past 240 hour(s))  Resp Panel by RT-PCR (Flu A&B, Covid) Nasopharyngeal Swab     Status: None   Collection Time: 03/19/21 11:03 PM    Specimen: Nasopharyngeal Swab; Nasopharyngeal(NP) swabs in vial transport medium  Result Value Ref Range Status   SARS Coronavirus 2 by RT PCR NEGATIVE NEGATIVE Final    Comment: (NOTE) SARS-CoV-2 target nucleic acids are NOT DETECTED.  The SARS-CoV-2 RNA is generally detectable in upper respiratory specimens during the acute phase of  infection. The lowest concentration of SARS-CoV-2 viral copies this assay can detect is 138 copies/mL. A negative result does not preclude SARS-Cov-2 infection and should not be used as the sole basis for treatment or other patient management decisions. A negative result may occur with  improper specimen collection/handling, submission of specimen other than nasopharyngeal swab, presence of viral mutation(s) within the areas targeted by this assay, and inadequate number of viral copies(<138 copies/mL). A negative result must be combined with clinical observations, patient history, and epidemiological information. The expected result is Negative.  Fact Sheet for Patients:  EntrepreneurPulse.com.au  Fact Sheet for Healthcare Providers:  IncredibleEmployment.be  This test is no t yet approved or cleared by the [place] FDA and  has been authorized for detection and/or diagnosis of SARS-CoV-2 by FDA under an Emergency Use Authorization (EUA). This EUA will remain  in effect (meaning this test can be used) for the duration of the COVID-19 declaration under Section 564(b)(1) of the Act, 21 U.S.C.section 360bbb-3(b)(1), unless the authorization is terminated  or revoked sooner.       Influenza A by PCR NEGATIVE NEGATIVE Final   Influenza B by PCR NEGATIVE NEGATIVE Final    Comment: (NOTE) The Xpert Xpress SARS-CoV-2/FLU/RSV plus assay is intended as an aid in the diagnosis of influenza from Nasopharyngeal swab specimens and should not be used as a sole basis for treatment. Nasal washings and aspirates are  unacceptable for Xpert Xpress SARS-CoV-2/FLU/RSV testing.  Fact Sheet for Patients: EntrepreneurPulse.com.au  Fact Sheet for Healthcare Providers: IncredibleEmployment.be  This test is not yet approved or cleared by the [place] FDA and has been authorized for detection and/or diagnosis of SARS-CoV-2 by FDA under an Emergency Use Authorization (EUA). This EUA will remain in effect (meaning this test can be used) for the duration of the COVID-19 declaration under Section 564(b)(1) of the Act, 21 U.S.C. section 360bbb-3(b)(1), unless the authorization is terminated or revoked.  Performed at [facility], [address] [place], [place] [address]   MRSA Next Gen by PCR, Nasal     Status: None   Collection Time: 03/20/21  9:15 AM   Specimen: Nasal Mucosa; Nasal Swab  Result Value Ref Range Status   MRSA by PCR Next Gen NOT DETECTED NOT DETECTED Final    Comment: (NOTE) The GeneXpert MRSA Assay (FDA approved for NASAL specimens only), is one component of a comprehensive MRSA colonization surveillance program. It is not intended to diagnose MRSA infection nor to guide or monitor treatment for MRSA infections. Test performance is not FDA approved in patients less than 47 years old. Performed at [facility], [place] [address], [place], [place] [address]          Radiology Studies: DG Chest 2 View  Result Date: 03/19/2021 CLINICAL DATA:  Shortness of breath EXAM: CHEST - 2 VIEW COMPARISON:  05/25/2017 FINDINGS: Increased interstitial markings. No focal consolidation or frank interstitial edema. No pleural effusion or pneumothorax. Cardiomegaly.  Left subclavian pacemaker. Visualized osseous structures are within normal limits. IMPRESSION: Cardiomegaly.  No frank interstitial edema. Electronically Signed   By: Julian Hy M.D.   On: 03/19/2021 21:47   CARDIAC CATHETERIZATION  Result Date: 03/21/2021   Severe calcified diffuse  three-vessel coronary artery disease.  Right dominant anatomy.   Nonobstructive left main   Ostial 75% calcified LAD with tandem mid 80% and distal 80% stenoses.   Diffusely diseased circumflex with 75% proximal to mid first obtuse marginal.  Mid to distal circumflex 70%  before small second obtuse marginal.   Dominant right coronary with previously placed proximal stent having diffuse 50% ISR, 80 to 90% segmental mid stenosis (probable culprit), and continuation of segmental 60% stenosis to the distal segment where the artery opens up to a 3.0 diameter.  PDA and LV branch are large.   Normal LVEDP.  EF 65%. RECOMMENDATIONS: Medical therapy versus CABG versus elevated risk RCA PCI.  Consider heart team approach after conversation with the patient and family about level of aggressiveness that they feel is appropriate. Aggressive risk factor modification and anti-ischemic therapy. If RCA PCI, would recommend femoral approach rather than radial for better catheter support during the intervention.   ECHOCARDIOGRAM COMPLETE  Result Date: 03/20/2021    ECHOCARDIOGRAM REPORT   Patient Name:   [name] Date of Exam: 03/20/2021 Medical Rec #:  [id number]    Height:       65.5 in Accession #:    [id number]   Weight:       141.4 lb Date of Birth:  May 03, 1936    BSA:          1.717 m Patient Age:    85 years     BP:           198/90 mmHg Patient Gender: F            HR:           60 bpm. Exam Location:  Inpatient Procedure: 2D Echo, 3D Echo, Cardiac Doppler and Color Doppler Indications:    R07.9* Chest pain, unspecified  History:        Patient has prior history of Echocardiogram examinations, most                 recent 06/01/2017. CAD, Abnormal ECG, Arrythmias:Atrial                 Fibrillation and Bradycardia, Signs/Symptoms:Chest Pain; Risk                 Factors:Hypertension and Dyslipidemia.  Sonographer:    Roseanna Rainbow RDCS Referring Phys: [id number] [facility]  Sonographer Comments: Technically difficult study due to  poor echo windows. Patient in high fowler's position. IMPRESSIONS  1. Left ventricular ejection fraction, by estimation, is 55 to 60%. The left ventricle has normal function. The left ventricle has no regional wall motion abnormalities. Left ventricular diastolic parameters are consistent with Grade II diastolic dysfunction (pseudonormalization). Elevated left ventricular end-diastolic pressure.  2. Right ventricular systolic function is normal. The right ventricular size is normal. There is normal pulmonary artery systolic pressure.  3. Left atrial size was moderately dilated.  4. The mitral valve is degenerative. Mild mitral valve regurgitation. No evidence of mitral stenosis.  5. There is partial fusion of the left and non-coronary cusps. The aortic valve is calcified. There is mild calcification of the aortic valve. There is mild thickening of the aortic valve. Aortic valve regurgitation is mild. Mild aortic valve sclerosis is present, with no evidence of aortic valve stenosis. Aortic regurgitation PHT measures 582 msec. Aortic valve area, by VTI measures 2.49 cm. Aortic valve mean gradient measures 3.5 mmHg. Aortic valve Vmax measures 1.23 m/s.  6. Aortic dilatation noted. There is mild dilatation of the aortic root, measuring 37 mm. There is mild dilatation of the ascending aorta, measuring 38 mm.  7. The inferior vena cava is normal in size with greater than 50% respiratory variability, suggesting right atrial pressure of 3 mmHg. FINDINGS  Left Ventricle:  Left ventricular ejection fraction, by estimation, is 55 to 60%. The left ventricle has normal function. The left ventricle has no regional wall motion abnormalities. The left ventricular internal cavity size was normal in size. There is  no left ventricular hypertrophy. Left ventricular diastolic parameters are consistent with Grade II diastolic dysfunction (pseudonormalization). Elevated left ventricular end-diastolic pressure. Right Ventricle: The right  ventricular size is normal. No increase in right ventricular wall thickness. Right ventricular systolic function is normal. There is normal pulmonary artery systolic pressure. The tricuspid regurgitant velocity is 2.64 m/s, and  with an assumed right atrial pressure of 3 mmHg, the estimated right ventricular systolic pressure is 123456 mmHg. Left Atrium: Left atrial size was moderately dilated. Right Atrium: Right atrial size was normal in size. Pericardium: There is no evidence of pericardial effusion. Mitral Valve: The mitral valve is degenerative in appearance. There is moderate thickening of the mitral valve leaflet(s). There is moderate calcification of the mitral valve leaflet(s). Mild to moderate mitral annular calcification. Mild mitral valve regurgitation. No evidence of mitral valve stenosis. The mean mitral valve gradient is 1.3 mmHg. Tricuspid Valve: The tricuspid valve is normal in structure. Tricuspid valve regurgitation is mild . No evidence of tricuspid stenosis. Aortic Valve: There is partial fusion of the left and non-coronary cusps. The aortic valve is calcified. There is mild calcification of the aortic valve. There is mild thickening of the aortic valve. Aortic valve regurgitation is mild. Aortic regurgitation PHT measures 582 msec. Mild aortic valve sclerosis is present, with no evidence of aortic valve stenosis. Aortic valve mean gradient measures 3.5 mmHg. Aortic valve peak gradient measures 6.1 mmHg. Aortic valve area, by VTI measures 2.49 cm. Pulmonic Valve: The pulmonic valve was normal in structure. Pulmonic valve regurgitation is trivial. No evidence of pulmonic stenosis. Aorta: Aortic dilatation noted. There is mild dilatation of the aortic root, measuring 37 mm. There is mild dilatation of the ascending aorta, measuring 38 mm. Venous: The inferior vena cava is normal in size with greater than 50% respiratory variability, suggesting right atrial pressure of 3 mmHg. IAS/Shunts: No atrial  level shunt detected by color flow Doppler. Additional Comments: A device lead is visualized.  LEFT VENTRICLE PLAX 2D LVIDd:         3.80 cm      Diastology LVIDs:         2.70 cm      LV e' medial:    3.68 cm/s LV PW:         0.95 cm      LV E/e' medial:  22.6 LV IVS:        1.40 cm      LV e' lateral:   5.48 cm/s LVOT diam:     1.80 cm      LV E/e' lateral: 15.2 LV SV:         68 LV SV Index:   40 LVOT Area:     2.54 cm                              3D Volume EF: LV Volumes (MOD)            3D EF:        51 % LV vol d, MOD A2C: 115.0 ml LV EDV:       94 ml LV vol d, MOD A4C: 55.6 ml  LV ESV:       46 ml  LV vol s, MOD A2C: 53.5 ml  LV SV:        48 ml LV vol s, MOD A4C: 22.5 ml LV SV MOD A2C:     61.5 ml LV SV MOD A4C:     55.6 ml LV SV MOD BP:      49.0 ml RIGHT VENTRICLE            IVC RV S prime:     9.38 cm/s  IVC diam: 1.80 cm TAPSE (M-mode): 2.1 cm LEFT ATRIUM             Index       RIGHT ATRIUM           Index LA diam:        3.30 cm 1.92 cm/m  RA Area:     17.80 cm LA Vol (A2C):   55.2 ml 32.15 ml/m RA Volume:   47.10 ml  27.43 ml/m LA Vol (A4C):   56.6 ml 32.97 ml/m LA Biplane Vol: 59.4 ml 34.60 ml/m  AORTIC VALVE AV Area (Vmax):    2.79 cm AV Area (Vmean):   2.39 cm AV Area (VTI):     2.49 cm AV Vmax:           123.50 cm/s AV Vmean:          82.400 cm/s AV VTI:            0.274 m AV Peak Grad:      6.1 mmHg AV Mean Grad:      3.5 mmHg LVOT Vmax:         135.50 cm/s LVOT Vmean:        77.550 cm/s LVOT VTI:          0.268 m LVOT/AV VTI ratio: 0.98 AI PHT:            582 msec  AORTA Ao Root diam: 3.70 cm Ao Asc diam:  3.80 cm MITRAL VALVE               TRICUSPID VALVE MV Area (PHT): 3.08 cm    TR Peak grad:   27.9 mmHg MV Mean grad:  1.3 mmHg    TR Vmax:        264.00 cm/s MV Decel Time: 246 msec MV E velocity: 83.10 cm/s  SHUNTS MV A velocity: 60.40 cm/s  Systemic VTI:  0.27 m MV E/A ratio:  1.38        Systemic Diam: 1.80 cm Skeet Latch MD Electronically signed by Skeet Latch MD  Signature Date/Time: 03/20/2021/5:08:48 PM    Final    CT Angio Chest/Abd/Pel for Dissection W and/or W/WO  Result Date: 03/20/2021 CLINICAL DATA:  Hypertension, back pain EXAM: CT ANGIOGRAPHY CHEST, ABDOMEN AND PELVIS TECHNIQUE: Non-contrast CT of the chest was initially obtained. Multidetector CT imaging through the chest, abdomen and pelvis was performed using the standard protocol during bolus administration of intravenous contrast. Multiplanar reconstructed images and MIPs were obtained and reviewed to evaluate the vascular anatomy. CONTRAST:  155m OMNIPAQUE IOHEXOL 350 MG/ML SOLN COMPARISON:  CTA chest, CT abdomen/pelvis dated 05/25/2017 FINDINGS: CTA CHEST FINDINGS Cardiovascular: On unenhanced CT, there is no evidence of intramural hematoma. Preferential opacification of the thoracic aorta. No evidence of thoracic aortic aneurysm or dissection. Atherosclerotic calcifications of the aortic arch. Although not tailored for evaluation of the pulmonary arteries, there is no evidence of central pulmonary embolism. The heart is top-normal in size. No pericardial effusion. Left subclavian pacemaker. Three vessel  coronary atherosclerosis. Mediastinum/Nodes: No suspicious mediastinal lymphadenopathy. Visualized thyroid is unremarkable. Lungs/Pleura: Biapical pleural-parenchymal scarring. Mild centrilobular and paraseptal emphysematous changes, upper lung predominant. No suspicious pulmonary nodules. No focal consolidation. No pleural effusion or pneumothorax. Musculoskeletal: Mild degenerative changes of the mid/lower thoracic spine. Review of the MIP images confirms the above findings. CTA ABDOMEN AND PELVIS FINDINGS VASCULAR Aorta: No evidence abdominal aortic aneurysm or dissection. Atherosclerotic calcifications. Patent. Celiac: Atherosclerotic calcifications at the origin.  Patent. SMA: Atherosclerotic calcifications at the origin.  Patent. Renals: Patent bilaterally.  Atherosclerotic calcifications. IMA:  Patent. Inflow: Patent bilaterally.  Atherosclerotic calcifications. Veins: Unremarkable. Review of the MIP images confirms the above findings. NON-VASCULAR Hepatobiliary: Liver is within normal limits. Status post cholecystectomy. No intrahepatic or extrahepatic ductal dilatation. Pancreas: 11 mm low-density lesion in the pancreatic head (series 5/image 97), new. Mild prominence of the main pancreatic duct, measuring 4-5 mm (series 5/image 91). Spleen: Coarse splenic granuloma. Adrenals/Urinary Tract: Adrenal glands are within normal limits. Scarring in the posterior left lower kidney. Right kidney is within normal limits. No hydronephrosis. Bladder is within normal limits. Stomach/Bowel: Stomach is within normal limits. No evidence of bowel obstruction. Appendix is not discretely visualized. Lymphatic: No suspicious abdominopelvic lymphadenopathy. Reproductive: Status post hysterectomy. Bilateral ovaries are within normal limits. Other: No abdominopelvic ascites. Musculoskeletal: Mild degenerative changes of the lumbar spine. Review of the MIP images confirms the above findings. IMPRESSION: No evidence of thoracoabdominal aortic aneurysm or dissection. 11 mm low-density lesion in the pancreatic head, new. This is poorly evaluated on CT. Follow-up MRI abdomen with/without contrast is suggested in 4 weeks (on an outpatient basis). Electronically Signed   By: Julian Hy M.D.   On: 03/20/2021 00:38        Scheduled Meds:  amiodarone  200 mg Oral Once per day on Mon Tue Wed Thu Fri Sat   amLODipine  10 mg Oral Daily   [START ON 03/22/2021] aspirin  81 mg Oral Daily   aspirin EC  81 mg Oral Daily   [START ON 03/22/2021] atorvastatin  80 mg Oral Daily   empagliflozin  10 mg Oral Daily   irbesartan  150 mg Oral Daily   isosorbide-hydrALAZINE  1 tablet Oral TID   metoprolol succinate  50 mg Oral QHS   metoprolol succinate  75 mg Oral Daily   pantoprazole  40 mg Oral Daily   primidone  50 mg Oral QHS    sodium chloride flush  3 mL Intravenous Q12H   Continuous Infusions:  sodium chloride 125 mL/hr at 03/21/21 1135   sodium chloride     heparin     nitroGLYCERIN Stopped (03/20/21 0206)     LOS: 2 days   Time spent: 32mns Greater than 50% of this time was spent in counseling, explanation of diagnosis, planning of further management, and coordination of care.   Voice Recognition /Viviann Sparedictation system was used to create this note, attempts have been made to correct errors. Please contact the author with questions and/or clarifications.   FFlorencia Reasons MD PhD FACP [facility]  Available via Epic secure chat 7am-7pm for nonurgent issues Please page for urgent issues To page the attending provider between 7A-7P or the covering provider during after hours 7P-7A, please log into the web site [email or web address] and access using universal [place] password for that web site. If you do not have the password, please call the hospital operator.    03/21/2021, 1:36 PM

## 2021-03-21 NOTE — Progress Notes (Signed)
[place] for Heparin  Indication: chest pain/ACS  Allergies  Allergen Reactions   Amitriptyline Hcl Other (See Comments)    Caused "jaws to twist and lock"   Sulfonamide Derivatives Other (See Comments)    UNKNOWN REACTION    Vital Signs: Temp: 98.6 F (37 C) (08/18 0014) Temp Source: Oral (08/18 0014) BP: 152/69 (08/18 0014) Pulse Rate: 66 (08/18 0014)  Labs: Recent Labs    03/19/21 2050 03/19/21 2320 03/20/21 0336 03/20/21 0712 03/20/21 1605 03/21/21 0215  HGB 13.0  --  12.0  --   --  12.5  HCT 40.3  --  38.0  --   --  39.6  PLT 216  --  200  --   --  206  HEPARINUNFRC  --   --   --  0.18* 0.21* 0.25*  CREATININE 0.64  --  0.65  --   --   --   TROPONINIHS 2,030* 1,869*  --   --   --   --      CrCl cannot be calculated (Unknown ideal weight.).   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 Consulted to start heparin. Pt does have a history of afib but has not been on any anti-coagulation. CBC/renal function good. PTA meds reviewed.   8/18 AM update:  Heparin level low but trending up Likely cath today  Goal of Therapy:  Heparin level 0.3-0.7 units/ml Monitor platelets by anticoagulation protocol: Yes   Plan:  Inc heparin to 1100  units/hr 1100 heparin level if not in cath lab by then  Narda Bonds, PharmD, [place] Pharmacist Phone: [phone]

## 2021-03-22 ENCOUNTER — Other Ambulatory Visit: Payer: Self-pay | Admitting: *Deleted

## 2021-03-22 DIAGNOSIS — K869 Disease of pancreas, unspecified: Secondary | ICD-10-CM

## 2021-03-22 DIAGNOSIS — I25110 Atherosclerotic heart disease of native coronary artery with unstable angina pectoris: Secondary | ICD-10-CM

## 2021-03-22 LAB — CBC
HCT: 36.9 % (ref 36.0–46.0)
Hemoglobin: 11.9 g/dL — ABNORMAL LOW (ref 12.0–15.0)
MCH: 30.7 pg (ref 26.0–34.0)
MCHC: 32.2 g/dL (ref 30.0–36.0)
MCV: 95.1 fL (ref 80.0–100.0)
Platelets: 197 10*3/uL (ref 150–400)
RBC: 3.88 MIL/uL (ref 3.87–5.11)
RDW: 13.9 % (ref 11.5–15.5)
WBC: 8 10*3/uL (ref 4.0–10.5)
nRBC: 0 % (ref 0.0–0.2)

## 2021-03-22 LAB — BASIC METABOLIC PANEL
Anion gap: 10 (ref 5–15)
BUN: 10 mg/dL (ref 8–23)
CO2: 25 mmol/L (ref 22–32)
Calcium: 9.1 mg/dL (ref 8.9–10.3)
Chloride: 104 mmol/L (ref 98–111)
Creatinine, Ser: 0.58 mg/dL (ref 0.44–1.00)
GFR, Estimated: >60 mL/min (ref >60)
Glucose, Bld: 103 mg/dL — ABNORMAL HIGH (ref 70–99)
Potassium: 3.8 mmol/L (ref 3.5–5.1)
Sodium: 139 mmol/L (ref 135–145)

## 2021-03-22 LAB — HEPARIN LEVEL (UNFRACTIONATED): Heparin Unfractionated: 0.45 IU/mL (ref 0.30–0.70)

## 2021-03-22 MED ORDER — HYDRALAZINE HCL 25 MG PO TABS: 25.0000 mg | ORAL_TABLET | Freq: Three times a day (TID) | ORAL | 0 refills | Status: DC

## 2021-03-22 MED ORDER — FUROSEMIDE 40 MG PO TABS: 40.0000 mg | ORAL_TABLET | Freq: Every day | ORAL | 0 refills | Status: DC | PRN

## 2021-03-22 MED ORDER — ISOSORBIDE MONONITRATE ER 60 MG PO TB24: 60.0000 mg | ORAL_TABLET | Freq: Every day | ORAL | 0 refills | Status: DC

## 2021-03-22 MED ORDER — CLOPIDOGREL BISULFATE 75 MG PO TABS: 75.0000 mg | ORAL_TABLET | Freq: Every day | ORAL | 0 refills | Status: DC

## 2021-03-22 MED ORDER — CLOPIDOGREL BISULFATE 75 MG PO TABS
75.0000 mg | ORAL_TABLET | Freq: Every day | ORAL | Status: DC
  Administered 2021-03-22: 75 mg via ORAL
  Filled 2021-03-22: qty 1

## 2021-03-22 MED ORDER — HYDRALAZINE HCL 25 MG PO TABS
25.0000 mg | ORAL_TABLET | Freq: Three times a day (TID) | ORAL | Status: DC
  Administered 2021-03-22: 25 mg via ORAL
  Filled 2021-03-22: qty 1

## 2021-03-22 MED ORDER — SENNOSIDES-DOCUSATE SODIUM 8.6-50 MG PO TABS: 1.0000 | ORAL_TABLET | Freq: Every day | ORAL | 0 refills | Status: DC

## 2021-03-22 MED ORDER — ISOSORBIDE MONONITRATE ER 60 MG PO TB24
60.0000 mg | ORAL_TABLET | Freq: Every day | ORAL | Status: DC
  Administered 2021-03-22: 60 mg via ORAL
  Filled 2021-03-22: qty 1

## 2021-03-22 MED ORDER — ATORVASTATIN CALCIUM 80 MG PO TABS: 80.0000 mg | ORAL_TABLET | Freq: Every day | ORAL | 0 refills | Status: DC

## 2021-03-22 MED ORDER — PANTOPRAZOLE SODIUM 40 MG PO TBEC: 40.0000 mg | DELAYED_RELEASE_TABLET | Freq: Every day | ORAL | 0 refills | Status: DC

## 2021-03-22 MED ORDER — AMLODIPINE BESYLATE 10 MG PO TABS: 10.0000 mg | ORAL_TABLET | Freq: Every day | ORAL | 0 refills | Status: DC

## 2021-03-22 MED ORDER — NITROGLYCERIN 0.4 MG SL SUBL: 0.4000 mg | SUBLINGUAL_TABLET | SUBLINGUAL | 12 refills | Status: DC | PRN

## 2021-03-22 NOTE — Patient Outreach (Signed)
[facility]) Care Management  03/22/2021  [name] 01-Aug-1936 [id number]  Patient was admitted to the hospital on 03/19/21. Nurse Health Coach will perform case closure and transfer patient to the [place] Team. Nurse was informed by [facility] Coordinator of patient's admission.    Plan: RN Health Coach Discipline Closure Discipline Closure letter sent to PCP   Emelia Loron RN, [place] [phone] [email or web address]

## 2021-03-22 NOTE — Progress Notes (Signed)
Progress Note  Patient Name: [name] Date of Encounter: 03/22/2021  [facility] Cardiologist: Rozann Lesches, MD   Subjective   Daughter at bedside. Reviewed results of cath and options for management, see below. She is feeling well, no concerns, hopeful that she will get to go home.  Inpatient Medications    Scheduled Meds:  amiodarone  200 mg Oral Once per day on Mon Tue Wed Thu Fri Sat   amLODipine  10 mg Oral Daily   aspirin EC  81 mg Oral Daily   atorvastatin  80 mg Oral Daily   empagliflozin  10 mg Oral Daily   hydrALAZINE  25 mg Oral TID   irbesartan  150 mg Oral Daily   isosorbide mononitrate  60 mg Oral Daily   metoprolol succinate  50 mg Oral QHS   metoprolol succinate  75 mg Oral Daily   pantoprazole  40 mg Oral Daily   primidone  50 mg Oral QHS   senna-docusate  1 tablet Oral BID   sodium chloride flush  3 mL Intravenous Q12H   Continuous Infusions:  sodium chloride     PRN Meds: sodium chloride, acetaminophen, acetaminophen, ALPRAZolam, oxyCODONE, oxyCODONE, sodium chloride flush   Vital Signs    Vitals:   03/22/21 0300 03/22/21 0404 03/22/21 0554 03/22/21 0728  BP: (!) 134/56 (!) 141/59  (!) 119/43  Pulse: 64 63  63  Resp:  17    Temp:  97.7 F (36.5 C)  97.8 F (36.6 C)  TempSrc:  Oral  Oral  SpO2: 98% 98%    Weight:   61 kg   Height:        Intake/Output Summary (Last 24 hours) at 03/22/2021 0945 Last data filed at 03/22/2021 0403 Gross per 24 hour  Intake 348.61 ml  Output 900 ml  Net -551.39 ml   Last 3 Weights 03/22/2021 03/21/2021 03/04/2021  Weight (lbs) 134 lb 7.7 oz 133 lb 6.1 oz 141 lb 6.4 oz  Weight (kg) 61 kg 60.5 kg 64.139 kg  Some encounter information is confidential and restricted. Go to Review Flowsheets activity to see all data.      Telemetry    Intermittently sinus and atrial paced - Personally Reviewed  ECG    03/21/21 atrial paced at 60 bpm - Personally Reviewed  Physical Exam   GEN: Well nourished,  well developed in no acute distress NECK: No JVD CARDIAC: regular rhythm, normal S1 and S2, no rubs or gallops. No murmur. VASCULAR: Radial pulses 2+ bilaterally.  RESPIRATORY:  Clear to auscultation without rales, wheezing or rhonchi  ABDOMEN: Soft, non-tender, non-distended MUSCULOSKELETAL:  Moves all 4 limbs independently SKIN: Warm and dry, no edema NEUROLOGIC:  No focal neuro deficits noted. PSYCHIATRIC:  Normal affect    Labs    High Sensitivity Troponin:   Recent Labs  Lab 03/19/21 2050 03/19/21 2320  TROPONINIHS 2,030* 1,869*      Chemistry Recent Labs  Lab 03/19/21 2050 03/20/21 0336 03/21/21 0215 03/22/21 0240  NA 138 139 137 139  K 3.4* 4.4 4.4 3.8  CL 107 105 102 104  CO2 '27 25 28 25  '$ GLUCOSE 103* 95 89 103*  BUN '11 12 13 10  '$ CREATININE 0.64 0.65 0.68 0.58  CALCIUM 8.9 8.9 9.5 9.1  PROT 8.1  --   --   --   ALBUMIN 3.9  --   --   --   AST 28  --   --   --  ALT 20  --   --   --   ALKPHOS 85  --   --   --   BILITOT 0.4  --   --   --   GFRNONAA >60 >60 >60 >60  ANIONGAP 4* '9 7 10     '$ Hematology Recent Labs  Lab 03/20/21 0336 03/21/21 0215 03/22/21 0240  WBC 7.9 7.7 8.0  RBC 3.93 4.14 3.88  HGB 12.0 12.5 11.9*  HCT 38.0 39.6 36.9  MCV 96.7 95.7 95.1  MCH 30.5 30.2 30.7  MCHC 31.6 31.6 32.2  RDW 13.7 14.0 13.9  PLT 200 206 197    BNP Recent Labs  Lab 03/19/21 2320  BNP 295.0*     DDimer No results for input(s): DDIMER in the last 168 hours.   Radiology    CARDIAC CATHETERIZATION  Result Date: 03/21/2021   Severe calcified diffuse three-vessel coronary artery disease.  Right dominant anatomy.   Nonobstructive left main   Ostial 75% calcified LAD with tandem mid 80% and distal 80% stenoses.   Diffusely diseased circumflex with 75% proximal to mid first obtuse marginal.  Mid to distal circumflex 70% before small second obtuse marginal.   Dominant right coronary with previously placed proximal stent having diffuse 50% ISR, 80 to 90%  segmental mid stenosis (probable culprit), and continuation of segmental 60% stenosis to the distal segment where the artery opens up to a 3.0 diameter.  PDA and LV branch are large.   Normal LVEDP.  EF 65%. RECOMMENDATIONS: Medical therapy versus CABG versus elevated risk RCA PCI.  Consider heart team approach after conversation with the patient and family about level of aggressiveness that they feel is appropriate. Aggressive risk factor modification and anti-ischemic therapy. If RCA PCI, would recommend femoral approach rather than radial for better catheter support during the intervention.   ECHOCARDIOGRAM COMPLETE  Result Date: 03/20/2021    ECHOCARDIOGRAM REPORT   Patient Name:   [name] Date of Exam: 03/20/2021 Medical Rec #:  [id number]    Height:       65.5 in Accession #:    [id number]   Weight:       141.4 lb Date of Birth:  Jun 03, 1936    BSA:          1.717 m Patient Age:    85 years     BP:           198/90 mmHg Patient Gender: F            HR:           60 bpm. Exam Location:  Inpatient Procedure: 2D Echo, 3D Echo, Cardiac Doppler and Color Doppler Indications:    R07.9* Chest pain, unspecified  History:        Patient has prior history of Echocardiogram examinations, most                 recent 06/01/2017. CAD, Abnormal ECG, Arrythmias:Atrial                 Fibrillation and Bradycardia, Signs/Symptoms:Chest Pain; Risk                 Factors:Hypertension and Dyslipidemia.  Sonographer:    Roseanna Rainbow RDCS Referring Phys: [id number] [facility]  Sonographer Comments: Technically difficult study due to poor echo windows. Patient in high fowler's position. IMPRESSIONS  1. Left ventricular ejection fraction, by estimation, is 55 to 60%. The left ventricle has normal function. The left ventricle has no  regional wall motion abnormalities. Left ventricular diastolic parameters are consistent with Grade II diastolic dysfunction (pseudonormalization). Elevated left ventricular end-diastolic pressure.  2.  Right ventricular systolic function is normal. The right ventricular size is normal. There is normal pulmonary artery systolic pressure.  3. Left atrial size was moderately dilated.  4. The mitral valve is degenerative. Mild mitral valve regurgitation. No evidence of mitral stenosis.  5. There is partial fusion of the left and non-coronary cusps. The aortic valve is calcified. There is mild calcification of the aortic valve. There is mild thickening of the aortic valve. Aortic valve regurgitation is mild. Mild aortic valve sclerosis is present, with no evidence of aortic valve stenosis. Aortic regurgitation PHT measures 582 msec. Aortic valve area, by VTI measures 2.49 cm. Aortic valve mean gradient measures 3.5 mmHg. Aortic valve Vmax measures 1.23 m/s.  6. Aortic dilatation noted. There is mild dilatation of the aortic root, measuring 37 mm. There is mild dilatation of the ascending aorta, measuring 38 mm.  7. The inferior vena cava is normal in size with greater than 50% respiratory variability, suggesting right atrial pressure of 3 mmHg. FINDINGS  Left Ventricle: Left ventricular ejection fraction, by estimation, is 55 to 60%. The left ventricle has normal function. The left ventricle has no regional wall motion abnormalities. The left ventricular internal cavity size was normal in size. There is  no left ventricular hypertrophy. Left ventricular diastolic parameters are consistent with Grade II diastolic dysfunction (pseudonormalization). Elevated left ventricular end-diastolic pressure. Right Ventricle: The right ventricular size is normal. No increase in right ventricular wall thickness. Right ventricular systolic function is normal. There is normal pulmonary artery systolic pressure. The tricuspid regurgitant velocity is 2.64 m/s, and  with an assumed right atrial pressure of 3 mmHg, the estimated right ventricular systolic pressure is 123456 mmHg. Left Atrium: Left atrial size was moderately dilated. Right  Atrium: Right atrial size was normal in size. Pericardium: There is no evidence of pericardial effusion. Mitral Valve: The mitral valve is degenerative in appearance. There is moderate thickening of the mitral valve leaflet(s). There is moderate calcification of the mitral valve leaflet(s). Mild to moderate mitral annular calcification. Mild mitral valve regurgitation. No evidence of mitral valve stenosis. The mean mitral valve gradient is 1.3 mmHg. Tricuspid Valve: The tricuspid valve is normal in structure. Tricuspid valve regurgitation is mild . No evidence of tricuspid stenosis. Aortic Valve: There is partial fusion of the left and non-coronary cusps. The aortic valve is calcified. There is mild calcification of the aortic valve. There is mild thickening of the aortic valve. Aortic valve regurgitation is mild. Aortic regurgitation PHT measures 582 msec. Mild aortic valve sclerosis is present, with no evidence of aortic valve stenosis. Aortic valve mean gradient measures 3.5 mmHg. Aortic valve peak gradient measures 6.1 mmHg. Aortic valve area, by VTI measures 2.49 cm. Pulmonic Valve: The pulmonic valve was normal in structure. Pulmonic valve regurgitation is trivial. No evidence of pulmonic stenosis. Aorta: Aortic dilatation noted. There is mild dilatation of the aortic root, measuring 37 mm. There is mild dilatation of the ascending aorta, measuring 38 mm. Venous: The inferior vena cava is normal in size with greater than 50% respiratory variability, suggesting right atrial pressure of 3 mmHg. IAS/Shunts: No atrial level shunt detected by color flow Doppler. Additional Comments: A device lead is visualized.  LEFT VENTRICLE PLAX 2D LVIDd:         3.80 cm      Diastology LVIDs:  2.70 cm      LV e' medial:    3.68 cm/s LV PW:         0.95 cm      LV E/e' medial:  22.6 LV IVS:        1.40 cm      LV e' lateral:   5.48 cm/s LVOT diam:     1.80 cm      LV E/e' lateral: 15.2 LV SV:         68 LV SV Index:   40  LVOT Area:     2.54 cm                              3D Volume EF: LV Volumes (MOD)            3D EF:        51 % LV vol d, MOD A2C: 115.0 ml LV EDV:       94 ml LV vol d, MOD A4C: 55.6 ml  LV ESV:       46 ml LV vol s, MOD A2C: 53.5 ml  LV SV:        48 ml LV vol s, MOD A4C: 22.5 ml LV SV MOD A2C:     61.5 ml LV SV MOD A4C:     55.6 ml LV SV MOD BP:      49.0 ml RIGHT VENTRICLE            IVC RV S prime:     9.38 cm/s  IVC diam: 1.80 cm TAPSE (M-mode): 2.1 cm LEFT ATRIUM             Index       RIGHT ATRIUM           Index LA diam:        3.30 cm 1.92 cm/m  RA Area:     17.80 cm LA Vol (A2C):   55.2 ml 32.15 ml/m RA Volume:   47.10 ml  27.43 ml/m LA Vol (A4C):   56.6 ml 32.97 ml/m LA Biplane Vol: 59.4 ml 34.60 ml/m  AORTIC VALVE AV Area (Vmax):    2.79 cm AV Area (Vmean):   2.39 cm AV Area (VTI):     2.49 cm AV Vmax:           123.50 cm/s AV Vmean:          82.400 cm/s AV VTI:            0.274 m AV Peak Grad:      6.1 mmHg AV Mean Grad:      3.5 mmHg LVOT Vmax:         135.50 cm/s LVOT Vmean:        77.550 cm/s LVOT VTI:          0.268 m LVOT/AV VTI ratio: 0.98 AI PHT:            582 msec  AORTA Ao Root diam: 3.70 cm Ao Asc diam:  3.80 cm MITRAL VALVE               TRICUSPID VALVE MV Area (PHT): 3.08 cm    TR Peak grad:   27.9 mmHg MV Mean grad:  1.3 mmHg    TR Vmax:        264.00 cm/s MV Decel Time: 246 msec MV E velocity: 83.10 cm/s  SHUNTS MV A velocity: 60.40  cm/s  Systemic VTI:  0.27 m MV E/A ratio:  1.38        Systemic Diam: 1.80 cm Skeet Latch MD Electronically signed by Skeet Latch MD Signature Date/Time: 03/20/2021/5:08:48 PM    Final     Cardiac Studies   Cath 03/21/21 Severe calcified diffuse three-vessel coronary artery disease.  Right dominant anatomy.   Nonobstructive left main   Ostial 75% calcified LAD with tandem mid 80% and distal 80% stenoses.   Diffusely diseased circumflex with 75% proximal to mid first obtuse marginal.  Mid to distal circumflex 70% before small  second obtuse marginal.   Dominant right coronary with previously placed proximal stent having diffuse 50% ISR, 80 to 90% segmental mid stenosis (probable culprit), and continuation of segmental 60% stenosis to the distal segment where the artery opens up to a 3.0 diameter.  PDA and LV branch are large.   Normal LVEDP.  EF 65%.   RECOMMENDATIONS: Medical therapy versus CABG versus elevated risk RCA PCI.  Consider heart team approach after conversation with the patient and family about level of aggressiveness that they feel is appropriate. Aggressive risk factor modification and anti-ischemic therapy. If RCA PCI, would recommend femoral approach rather than radial for better catheter support during the intervention.   Echo 03/20/21  1. Left ventricular ejection fraction, by estimation, is 55 to 60%. The left ventricle has normal function. The left ventricle has no regional wall motion abnormalities. Left ventricular diastolic parameters are consistent with Grade II diastolic dysfunction (pseudonormalization). Elevated left ventricular end-diastolic pressure.   2. Right ventricular systolic function is normal. The right ventricular size is normal. There is normal pulmonary artery systolic pressure.   3. Left atrial size was moderately dilated.   4. The mitral valve is degenerative. Mild mitral valve regurgitation. No evidence of mitral stenosis.   5. There is partial fusion of the left and non-coronary cusps. The aortic valve is calcified. There is mild calcification of the aortic valve. There is mild thickening of the aortic valve. Aortic valve regurgitation is mild. Mild aortic valve sclerosis  is present, with no evidence of aortic valve stenosis. Aortic regurgitation PHT measures 582 msec. Aortic valve area, by VTI measures 2.49 cm. Aortic valve mean gradient measures 3.5 mmHg. Aortic valve Vmax measures 1.23 m/s.   6. Aortic dilatation noted. There is mild dilatation of the aortic root, measuring  37 mm. There is mild dilatation of the ascending aorta, measuring 38 mm.   7. The inferior vena cava is normal in size with greater than 50% respiratory variability, suggesting right atrial pressure of 3 mmHg.   Patient Profile     85 y.o. female with PMH CAD with remote DES to RCA, paroxysmal atrial fibrillation not on anticoagulation per patient preference, chronic diastolic heart failure, tachybrady syndrome s/p permanent pacemaker, hypertension, hyperlipidemia who we are seeing in consultation for chest pain and elevated troponins at the request of Dr. Myna Hidalgo. Cath showed three vessel CAD.  Assessment & Plan    Chest pain: resolved with blood pressure control Severe three vessel CAD, calcified -we discussed options for management, with both patient and her daughter April. They would like to pursue medical management at this time, as she has not had further symptoms -counseled on red flag warning signs that need immediate medical attention -would refill home nitro prescription -will add clopidogrel for 12 mos  Hypertensive urgency vs. Emergency:  -hsTn >2000, with chest pain. Given her severe CAD, suspect this was demand in the setting  of severely elevated pressure, though cannot exclude NSTEMI -Echo without wall motion abnormalities -started amlodipine this admission. Will change bidil to long acting isosorbide and hydralazine. Continue home irbesartan and metoprolol  Chronic diastolic heart failure: on room air, appears euvolemic -continue empagliflozin -echo with normal right sided pressure  Atrial fibrillation, declined anticoagulation Tachy-brady syndrome s/p PPM -continue amiodarone  [facility] will sign off.   Medication Recommendations:   Amiodarone per her custom home schedule Aspirin 81 mg daily Atorvastatin 80 mg daily Clopidogrel 75 mg daily Empagliflozin 10 mg daily Hydralazine 25 mg TID Isosorbide 60 mg daily Metoprolol succinate 75 mg in the AM and 50 mg in  the PM Other recommendations (labs, testing, etc):  bmet, CBC as outpatient Follow up as an outpatient:  We will arrange for outpatient follow up with Dr. Myles Gip office   Total time of encounter: 40 minutes total time of encounter, including 30 minutes spent in face-to-face patient care. This time includes coordination of care and counseling regarding results of cath, options for management, plans for medications.Angus Seller of non-face-to-face time involved reviewing chart documents/testing relevant to the patient encounter and documentation in the medical record.  Buford Dresser, MD, PhD, [place]    For questions or updates, please contact [place] Please consult [email or web address] for contact info under     Signed, Buford Dresser, MD  03/22/2021, 9:45 AM

## 2021-03-22 NOTE — Care Management Important Message (Signed)
Important Message  Patient Details  Name: [name] MRN: [id number] Date of Birth: [birth year]   Medicare Important Message Given:  Yes     Psalm Arman [address] 03/22/2021, 3:04 PM

## 2021-03-22 NOTE — Discharge Summary (Signed)
Discharge Summary  [name] [id number] DOB: 10-05-1935  PCP: Celene Squibb, MD  Admit date: 03/19/2021 Discharge date: 03/22/2021  Time spent: 25mns, more than 50% time spent on coordination of care.  Recommendations for Outpatient Follow-up:  F/u with PCP within a week  for hospital discharge follow up, repeat cbc/bmp at follow up. Pcp to schedule outpatient mri for pancreatic lesion  F/u with cardiology    Discharge Diagnoses:  Active Hospital Problems   Diagnosis Date Noted   CAD (coronary artery disease), native coronary artery 04/18/2009   Non-STEMI (non-ST elevated myocardial infarction) ([facility]    Hypertensive urgency    Pancreatic lesion 03/20/2021   Hypertensive crisis 03/19/2021   Elevated troponin 03/19/2021   Prolonged QT interval 03/19/2021   [facility] diastolic heart failure ([facility] 08/23/2014   BRADYCARDIA-TACHYCARDIA SYNDROME 01/26/2009   Atrial fibrillation ([facility] 12/19/2008   Anxiety 12/19/2008    Resolved Hospital Problems  No resolved problems to display.    Discharge Condition: stable  Diet recommendation: heart healthy  Filed Weights   03/21/21 0623 03/22/21 0554  Weight: 60.5 kg 61 kg    History of present illness:  Katrina COXENis a 85y.o. female with medical history significant for atrial fibrillation not anticoagulated, coronary artery disease with stent to RCA in 2010, sinus node dysfunction with pacer, chronic diastolic CHF, anxiety, and hypertension, now presented to the emergency department for evaluation of high blood pressure, found to have elevated troponin, cardiology consulted   Hospital Course:  Principal Problem:   CAD (coronary artery disease), native coronary artery Active Problems:   Anxiety   Atrial fibrillation (HCC)   BRADYCARDIA-TACHYCARDIA SYNDROME   Chronic diastolic heart failure (HCC)   Hypertensive crisis   Elevated troponin   Prolonged QT interval   Pancreatic lesion   Non-STEMI (non-ST elevated myocardial  infarction) ([facility]   Hypertensive urgency  Non-STEMI /demand ischemia in the setting of severely elevated blood pressur, with history of CAD status post DES to RCA in 2010 -presents with back pain/chest pain -No aortic aneurysm or dissection on CTA -Echocardiogram with preserved lvef and grade II diastolic dysfunction -was on heparin drip, nitrodrip initially, s/p cardiac cath with three vessel disease, family decide on medical management  -she meds is adjusted, she is pain free and desires to go home, she is cleared to discharge home by cardiology -discharge meds as listed below    Hypertension emergency Blood pressure improved with meds adjustment, discharge meds listed below -f/u with pcp and cardiology   Paroxysmal A. fib, history of tachybradycardia syndrome status post pacemaker -Sinus rhythm in the ED, currently paced rhythm,  -Patient refused anticoagulation in the past -f/u with  cardiology   Chronic diastolic CHF -Does not appear volume overloaded on presentation -Echocardiogram with preserved lvef and grade II diastolic dysfunction -she did not require any lasix in the hospital, she reports only take lasix as needed at home -f/u with cardiology    Hypokalemia Replaced and normalized   QTC prolongation Improved on Repeat EKG ,  keep K above 4, mag above 2 Avoid QTC prolongation agent     Pancreatic lesion  - Noted incidentally on CT in ED  - Outpatient follow-up with MRI recommended, daughter was made aware   F/u with pcp   Anxiety On as needed Xanax at home   Constipation Stool softener       Consultants:  Cardiology   Procedures:  cardiac cath on 8/18   Antimicrobials:     None  Discharge Exam: BP (!) 150/70   Pulse (!) 59   Temp 97.8 F (36.6 C) (Oral)   Resp 17   Ht '5\' 5"'$  (1.651 m)   Wt 61 kg   SpO2 98%   BMI 22.38 kg/m   General: NAD, AAox3, chronic tremors  Cardiovascular: RRR Respiratory: normal respiratory effort    Discharge Instructions You were cared for by a hospitalist during your hospital stay. If you have any questions about your discharge medications or the care you received while you were in the hospital after you are discharged, you can call the unit and asked to speak with the hospitalist on call if the hospitalist that took care of you is not available. Once you are discharged, your primary care physician will handle any further medical issues. Please note that NO REFILLS for any discharge medications will be authorized once you are discharged, as it is imperative that you return to your primary care physician (or establish a relationship with a primary care physician if you do not have one) for your aftercare needs so that they can reassess your need for medications and monitor your lab values.  Discharge Instructions     Diet - low sodium heart healthy   Complete by: As directed    Increase activity slowly   Complete by: As directed       Allergies as of 03/22/2021       Reactions   Amitriptyline Hcl Other (See Comments)   Caused "jaws to twist and lock"   Sulfonamide Derivatives Other (See Comments)   UNKNOWN REACTION        Medication List     STOP taking these medications    ALPRAZolam 1 MG tablet Commonly known as: XANAX   clobetasol cream 0.05 % Commonly known as: TEMOVATE   diclofenac Sodium 1 % Gel Commonly known as: VOLTAREN   omeprazole 40 MG capsule Commonly known as: PRILOSEC Replaced by: pantoprazole 40 MG tablet   rosuvastatin 20 MG tablet Commonly known as: CRESTOR   triamcinolone 0.025 % cream Commonly known as: KENALOG       TAKE these medications    amiodarone 200 MG tablet Commonly known as: PACERONE Take 200 mg Daily Monday - Saturday None on Sunday   amLODipine 10 MG tablet Commonly known as: NORVASC Take 1 tablet (10 mg total) by mouth daily. Start taking on: March 23, 2021   aspirin EC 81 MG tablet Take 1 tablet (81 mg total)  by mouth daily. Swallow whole.   atorvastatin 80 MG tablet Commonly known as: LIPITOR Take 1 tablet (80 mg total) by mouth daily. Start taking on: March 23, 2021   clopidogrel 75 MG tablet Commonly known as: PLAVIX Take 1 tablet (75 mg total) by mouth daily. Start taking on: August 20, 123456   folic acid A999333 MCG tablet Commonly known as: FOLVITE Take 400 mcg by mouth daily.   furosemide 40 MG tablet Commonly known as: LASIX Take 1 tablet (40 mg total) by mouth daily as needed for fluid or edema. What changed:  when to take this reasons to take this additional instructions   hydrALAZINE 25 MG tablet Commonly known as: APRESOLINE Take 1 tablet (25 mg total) by mouth 3 (three) times daily.   irbesartan 150 MG tablet Commonly known as: AVAPRO TAKE (1) TABLET BY MOUTH ONCE DAILY.   isosorbide mononitrate 60 MG 24 hr tablet Commonly known as: IMDUR Take 1 tablet (60 mg total) by mouth daily. Start taking on: March 23, 2021   Jardiance 10 MG Tabs tablet Generic drug: empagliflozin Take 10 mg by mouth daily.   LORazepam 0.5 MG tablet Commonly known as: ATIVAN Take 0.5 mg by mouth 2 (two) times daily.   metoprolol succinate 25 MG 24 hr tablet Commonly known as: TOPROL-XL Take 3 tablets (75 mg total) by mouth in the morning. & 50 mg in the evening   multivitamin with minerals Tabs tablet Take 1 tablet by mouth daily.   nitroGLYCERIN 0.4 MG SL tablet Commonly known as: NITROSTAT Place 1 tablet (0.4 mg total) under the tongue every 5 (five) minutes x 3 doses as needed for chest pain.   ondansetron 4 MG tablet Commonly known as: ZOFRAN Take 4 mg by mouth every 6 (six) hours as needed for nausea or vomiting.   pantoprazole 40 MG tablet Commonly known as: PROTONIX Take 1 tablet (40 mg total) by mouth daily. Start taking on: March 23, 2021 Replaces: omeprazole 40 MG capsule   potassium chloride SA 20 MEQ tablet Commonly known as: KLOR-CON Take 20 mEq by mouth See  admin instructions. Takes 1 tablet (20 meq) by mouth daily (scheduled) & a 2nd tablet only if has to take a 2nd dose of Lasix   primidone 50 MG tablet Commonly known as: MYSOLINE Take 50 mg by mouth at bedtime.   ProAir HFA 108 (90 Base) MCG/ACT inhaler Generic drug: albuterol Inhale 1 puff into the lungs every 4 (four) hours as needed for wheezing or shortness of breath.   senna-docusate 8.6-50 MG tablet Commonly known as: Senokot-S Take 1 tablet by mouth at bedtime.   Vitamin B-12 5000 MCG Subl Take 5,000 mcg by mouth daily.   VITAMIN D (CHOLECALCIFEROL) PO Take 1 tablet by mouth daily.       Allergies  Allergen Reactions   Amitriptyline Hcl Other (See Comments)    Caused "jaws to twist and lock"   Sulfonamide Derivatives Other (See Comments)    UNKNOWN REACTION    Follow-up Information     Celene Squibb, MD Follow up in 1 week(s).   Specialty: Internal Medicine Why: hospital discharge follow up Outpatient follow-up with MRI recommended for pancreatic lesion Contact information: [address] [place] [address] [phone]         Satira Sark, MD .   Specialty: Cardiology Contact information: [place] [address] [phone]         Evans Lance, MD .   Specialty: Cardiology Contact information: [place] [address] [phone]                  The results of significant diagnostics from this hospitalization (including imaging, microbiology, ancillary and laboratory) are listed below for reference.    Significant Diagnostic Studies: DG Chest 2 View  Result Date: 03/19/2021 CLINICAL DATA:  Shortness of breath EXAM: CHEST - 2 VIEW COMPARISON:  05/25/2017 FINDINGS: Increased interstitial markings. No focal consolidation or frank interstitial edema. No pleural effusion or pneumothorax. Cardiomegaly.  Left subclavian pacemaker. Visualized osseous structures are within normal limits.  IMPRESSION: Cardiomegaly.  No frank interstitial edema. Electronically Signed   By: Julian Hy M.D.   On: 03/19/2021 21:47   CARDIAC CATHETERIZATION  Result Date: 03/21/2021   Severe calcified diffuse three-vessel coronary artery disease.  Right dominant anatomy.   Nonobstructive left main   Ostial 75% calcified LAD with tandem mid 80% and distal 80% stenoses.   Diffusely diseased circumflex with 75%  proximal to mid first obtuse marginal.  Mid to distal circumflex 70% before small second obtuse marginal.   Dominant right coronary with previously placed proximal stent having diffuse 50% ISR, 80 to 90% segmental mid stenosis (probable culprit), and continuation of segmental 60% stenosis to the distal segment where the artery opens up to a 3.0 diameter.  PDA and LV branch are large.   Normal LVEDP.  EF 65%. RECOMMENDATIONS: Medical therapy versus CABG versus elevated risk RCA PCI.  Consider heart team approach after conversation with the patient and family about level of aggressiveness that they feel is appropriate. Aggressive risk factor modification and anti-ischemic therapy. If RCA PCI, would recommend femoral approach rather than radial for better catheter support during the intervention.   ECHOCARDIOGRAM COMPLETE  Result Date: 03/20/2021    ECHOCARDIOGRAM REPORT   Patient Name:   DAIRY WISSER Date of Exam: 03/20/2021 Medical Rec #:  [id number]    Height:       65.5 in Accession #:    [id number]   Weight:       141.4 lb Date of Birth:  1935/11/05    BSA:          1.717 m Patient Age:    37 years     BP:           198/90 mmHg Patient Gender: F            HR:           60 bpm. Exam Location:  Inpatient Procedure: 2D Echo, 3D Echo, Cardiac Doppler and Color Doppler Indications:    R07.9* Chest pain, unspecified  History:        Patient has prior history of Echocardiogram examinations, most                 recent 06/01/2017. CAD, Abnormal ECG, Arrythmias:Atrial                 Fibrillation and Bradycardia,  Signs/Symptoms:Chest Pain; Risk                 Factors:Hypertension and Dyslipidemia.  Sonographer:    Roseanna Rainbow RDCS Referring Phys: [id number] [facility]  Sonographer Comments: Technically difficult study due to poor echo windows. Patient in high fowler's position. IMPRESSIONS  1. Left ventricular ejection fraction, by estimation, is 55 to 60%. The left ventricle has normal function. The left ventricle has no regional wall motion abnormalities. Left ventricular diastolic parameters are consistent with Grade II diastolic dysfunction (pseudonormalization). Elevated left ventricular end-diastolic pressure.  2. Right ventricular systolic function is normal. The right ventricular size is normal. There is normal pulmonary artery systolic pressure.  3. Left atrial size was moderately dilated.  4. The mitral valve is degenerative. Mild mitral valve regurgitation. No evidence of mitral stenosis.  5. There is partial fusion of the left and non-coronary cusps. The aortic valve is calcified. There is mild calcification of the aortic valve. There is mild thickening of the aortic valve. Aortic valve regurgitation is mild. Mild aortic valve sclerosis is present, with no evidence of aortic valve stenosis. Aortic regurgitation PHT measures 582 msec. Aortic valve area, by VTI measures 2.49 cm. Aortic valve mean gradient measures 3.5 mmHg. Aortic valve Vmax measures 1.23 m/s.  6. Aortic dilatation noted. There is mild dilatation of the aortic root, measuring 37 mm. There is mild dilatation of the ascending aorta, measuring 38 mm.  7. The inferior vena cava is normal in size with greater than 50% respiratory  variability, suggesting right atrial pressure of 3 mmHg. FINDINGS  Left Ventricle: Left ventricular ejection fraction, by estimation, is 55 to 60%. The left ventricle has normal function. The left ventricle has no regional wall motion abnormalities. The left ventricular internal cavity size was normal in size. There is  no left  ventricular hypertrophy. Left ventricular diastolic parameters are consistent with Grade II diastolic dysfunction (pseudonormalization). Elevated left ventricular end-diastolic pressure. Right Ventricle: The right ventricular size is normal. No increase in right ventricular wall thickness. Right ventricular systolic function is normal. There is normal pulmonary artery systolic pressure. The tricuspid regurgitant velocity is 2.64 m/s, and  with an assumed right atrial pressure of 3 mmHg, the estimated right ventricular systolic pressure is 123456 mmHg. Left Atrium: Left atrial size was moderately dilated. Right Atrium: Right atrial size was normal in size. Pericardium: There is no evidence of pericardial effusion. Mitral Valve: The mitral valve is degenerative in appearance. There is moderate thickening of the mitral valve leaflet(s). There is moderate calcification of the mitral valve leaflet(s). Mild to moderate mitral annular calcification. Mild mitral valve regurgitation. No evidence of mitral valve stenosis. The mean mitral valve gradient is 1.3 mmHg. Tricuspid Valve: The tricuspid valve is normal in structure. Tricuspid valve regurgitation is mild . No evidence of tricuspid stenosis. Aortic Valve: There is partial fusion of the left and non-coronary cusps. The aortic valve is calcified. There is mild calcification of the aortic valve. There is mild thickening of the aortic valve. Aortic valve regurgitation is mild. Aortic regurgitation PHT measures 582 msec. Mild aortic valve sclerosis is present, with no evidence of aortic valve stenosis. Aortic valve mean gradient measures 3.5 mmHg. Aortic valve peak gradient measures 6.1 mmHg. Aortic valve area, by VTI measures 2.49 cm. Pulmonic Valve: The pulmonic valve was normal in structure. Pulmonic valve regurgitation is trivial. No evidence of pulmonic stenosis. Aorta: Aortic dilatation noted. There is mild dilatation of the aortic root, measuring 37 mm. There is mild  dilatation of the ascending aorta, measuring 38 mm. Venous: The inferior vena cava is normal in size with greater than 50% respiratory variability, suggesting right atrial pressure of 3 mmHg. IAS/Shunts: No atrial level shunt detected by color flow Doppler. Additional Comments: A device lead is visualized.  LEFT VENTRICLE PLAX 2D LVIDd:         3.80 cm      Diastology LVIDs:         2.70 cm      LV e' medial:    3.68 cm/s LV PW:         0.95 cm      LV E/e' medial:  22.6 LV IVS:        1.40 cm      LV e' lateral:   5.48 cm/s LVOT diam:     1.80 cm      LV E/e' lateral: 15.2 LV SV:         68 LV SV Index:   40 LVOT Area:     2.54 cm                              3D Volume EF: LV Volumes (MOD)            3D EF:        51 % LV vol d, MOD A2C: 115.0 ml LV EDV:       94 ml LV vol d, MOD A4C: 55.6  ml  LV ESV:       46 ml LV vol s, MOD A2C: 53.5 ml  LV SV:        48 ml LV vol s, MOD A4C: 22.5 ml LV SV MOD A2C:     61.5 ml LV SV MOD A4C:     55.6 ml LV SV MOD BP:      49.0 ml RIGHT VENTRICLE            IVC RV S prime:     9.38 cm/s  IVC diam: 1.80 cm TAPSE (M-mode): 2.1 cm LEFT ATRIUM             Index       RIGHT ATRIUM           Index LA diam:        3.30 cm 1.92 cm/m  RA Area:     17.80 cm LA Vol (A2C):   55.2 ml 32.15 ml/m RA Volume:   47.10 ml  27.43 ml/m LA Vol (A4C):   56.6 ml 32.97 ml/m LA Biplane Vol: 59.4 ml 34.60 ml/m  AORTIC VALVE AV Area (Vmax):    2.79 cm AV Area (Vmean):   2.39 cm AV Area (VTI):     2.49 cm AV Vmax:           123.50 cm/s AV Vmean:          82.400 cm/s AV VTI:            0.274 m AV Peak Grad:      6.1 mmHg AV Mean Grad:      3.5 mmHg LVOT Vmax:         135.50 cm/s LVOT Vmean:        77.550 cm/s LVOT VTI:          0.268 m LVOT/AV VTI ratio: 0.98 AI PHT:            582 msec  AORTA Ao Root diam: 3.70 cm Ao Asc diam:  3.80 cm MITRAL VALVE               TRICUSPID VALVE MV Area (PHT): 3.08 cm    TR Peak grad:   27.9 mmHg MV Mean grad:  1.3 mmHg    TR Vmax:        264.00 cm/s MV Decel Time:  246 msec MV E velocity: 83.10 cm/s  SHUNTS MV A velocity: 60.40 cm/s  Systemic VTI:  0.27 m MV E/A ratio:  1.38        Systemic Diam: 1.80 cm Skeet Latch MD Electronically signed by Skeet Latch MD Signature Date/Time: 03/20/2021/5:08:48 PM    Final    CT Angio Chest/Abd/Pel for Dissection W and/or W/WO  Result Date: 03/20/2021 CLINICAL DATA:  Hypertension, back pain EXAM: CT ANGIOGRAPHY CHEST, ABDOMEN AND PELVIS TECHNIQUE: Non-contrast CT of the chest was initially obtained. Multidetector CT imaging through the chest, abdomen and pelvis was performed using the standard protocol during bolus administration of intravenous contrast. Multiplanar reconstructed images and MIPs were obtained and reviewed to evaluate the vascular anatomy. CONTRAST:  134m OMNIPAQUE IOHEXOL 350 MG/ML SOLN COMPARISON:  CTA chest, CT abdomen/pelvis dated 05/25/2017 FINDINGS: CTA CHEST FINDINGS Cardiovascular: On unenhanced CT, there is no evidence of intramural hematoma. Preferential opacification of the thoracic aorta. No evidence of thoracic aortic aneurysm or dissection. Atherosclerotic calcifications of the aortic arch. Although not tailored for evaluation of the pulmonary arteries, there is no evidence of central pulmonary embolism. The heart  is top-normal in size. No pericardial effusion. Left subclavian pacemaker. Three vessel coronary atherosclerosis. Mediastinum/Nodes: No suspicious mediastinal lymphadenopathy. Visualized thyroid is unremarkable. Lungs/Pleura: Biapical pleural-parenchymal scarring. Mild centrilobular and paraseptal emphysematous changes, upper lung predominant. No suspicious pulmonary nodules. No focal consolidation. No pleural effusion or pneumothorax. Musculoskeletal: Mild degenerative changes of the mid/lower thoracic spine. Review of the MIP images confirms the above findings. CTA ABDOMEN AND PELVIS FINDINGS VASCULAR Aorta: No evidence abdominal aortic aneurysm or dissection. Atherosclerotic  calcifications. Patent. Celiac: Atherosclerotic calcifications at the origin.  Patent. SMA: Atherosclerotic calcifications at the origin.  Patent. Renals: Patent bilaterally.  Atherosclerotic calcifications. IMA: Patent. Inflow: Patent bilaterally.  Atherosclerotic calcifications. Veins: Unremarkable. Review of the MIP images confirms the above findings. NON-VASCULAR Hepatobiliary: Liver is within normal limits. Status post cholecystectomy. No intrahepatic or extrahepatic ductal dilatation. Pancreas: 11 mm low-density lesion in the pancreatic head (series 5/image 97), new. Mild prominence of the main pancreatic duct, measuring 4-5 mm (series 5/image 91). Spleen: Coarse splenic granuloma. Adrenals/Urinary Tract: Adrenal glands are within normal limits. Scarring in the posterior left lower kidney. Right kidney is within normal limits. No hydronephrosis. Bladder is within normal limits. Stomach/Bowel: Stomach is within normal limits. No evidence of bowel obstruction. Appendix is not discretely visualized. Lymphatic: No suspicious abdominopelvic lymphadenopathy. Reproductive: Status post hysterectomy. Bilateral ovaries are within normal limits. Other: No abdominopelvic ascites. Musculoskeletal: Mild degenerative changes of the lumbar spine. Review of the MIP images confirms the above findings. IMPRESSION: No evidence of thoracoabdominal aortic aneurysm or dissection. 11 mm low-density lesion in the pancreatic head, new. This is poorly evaluated on CT. Follow-up MRI abdomen with/without contrast is suggested in 4 weeks (on an outpatient basis). Electronically Signed   By: Julian Hy M.D.   On: 03/20/2021 00:38    Microbiology: Recent Results (from the past 240 hour(s))  Resp Panel by RT-PCR (Flu A&B, Covid) Nasopharyngeal Swab     Status: None   Collection Time: 03/19/21 11:03 PM   Specimen: Nasopharyngeal Swab; Nasopharyngeal(NP) swabs in vial transport medium  Result Value Ref Range Status   SARS  Coronavirus 2 by RT PCR NEGATIVE NEGATIVE Final    Comment: (NOTE) SARS-CoV-2 target nucleic acids are NOT DETECTED.  The SARS-CoV-2 RNA is generally detectable in upper respiratory specimens during the acute phase of infection. The lowest concentration of SARS-CoV-2 viral copies this assay can detect is 138 copies/mL. A negative result does not preclude SARS-Cov-2 infection and should not be used as the sole basis for treatment or other patient management decisions. A negative result may occur with  improper specimen collection/handling, submission of specimen other than nasopharyngeal swab, presence of viral mutation(s) within the areas targeted by this assay, and inadequate number of viral copies(<138 copies/mL). A negative result must be combined with clinical observations, patient history, and epidemiological information. The expected result is Negative.  Fact Sheet for Patients:  EntrepreneurPulse.com.au  Fact Sheet for Healthcare Providers:  IncredibleEmployment.be  This test is no t yet approved or cleared by the [place] FDA and  has been authorized for detection and/or diagnosis of SARS-CoV-2 by FDA under an Emergency Use Authorization (EUA). This EUA will remain  in effect (meaning this test can be used) for the duration of the COVID-19 declaration under Section 564(b)(1) of the Act, 21 U.S.C.section 360bbb-3(b)(1), unless the authorization is terminated  or revoked sooner.       Influenza A by PCR NEGATIVE NEGATIVE Final   Influenza B by PCR NEGATIVE NEGATIVE Final    Comment: (  NOTE) The Xpert Xpress SARS-CoV-2/FLU/RSV plus assay is intended as an aid in the diagnosis of influenza from Nasopharyngeal swab specimens and should not be used as a sole basis for treatment. Nasal washings and aspirates are unacceptable for Xpert Xpress SARS-CoV-2/FLU/RSV testing.  Fact Sheet for  Patients: EntrepreneurPulse.com.au  Fact Sheet for Healthcare Providers: IncredibleEmployment.be  This test is not yet approved or cleared by the [place] FDA and has been authorized for detection and/or diagnosis of SARS-CoV-2 by FDA under an Emergency Use Authorization (EUA). This EUA will remain in effect (meaning this test can be used) for the duration of the COVID-19 declaration under Section 564(b)(1) of the Act, 21 U.S.C. section 360bbb-3(b)(1), unless the authorization is terminated or revoked.  Performed at [facility], [address], [place], [place] [address]   MRSA Next Gen by PCR, Nasal     Status: None   Collection Time: 03/20/21  9:15 AM   Specimen: Nasal Mucosa; Nasal Swab  Result Value Ref Range Status   MRSA by PCR Next Gen NOT DETECTED NOT DETECTED Final    Comment: (NOTE) The GeneXpert MRSA Assay (FDA approved for NASAL specimens only), is one component of a comprehensive MRSA colonization surveillance program. It is not intended to diagnose MRSA infection nor to guide or monitor treatment for MRSA infections. Test performance is not FDA approved in patients less than 44 years old. Performed at [facility], [place] [address] [place], [place] [address]      Labs: Basic Metabolic Panel: Recent Labs  Lab 03/19/21 2050 03/19/21 2320 03/20/21 0336 03/21/21 0215 03/22/21 0240  NA 138  --  139 137 139  K 3.4*  --  4.4 4.4 3.8  CL 107  --  105 102 104  CO2 27  --  '25 28 25  '$ GLUCOSE 103*  --  95 89 103*  BUN 11  --  '12 13 10  '$ CREATININE 0.64  --  0.65 0.68 0.58  CALCIUM 8.9  --  8.9 9.5 9.1  MG  --  2.3 2.2  --   --    Liver Function Tests: Recent Labs  Lab 03/19/21 2050  AST 28  ALT 20  ALKPHOS 85  BILITOT 0.4  PROT 8.1  ALBUMIN 3.9   No results for input(s): LIPASE, AMYLASE in the last 168 hours. No results for input(s): AMMONIA in the last 168 hours. CBC: Recent Labs  Lab 03/19/21 2050  03/20/21 0336 03/21/21 0215 03/22/21 0240  WBC 8.6 7.9 7.7 8.0  NEUTROABS 5.4  --   --   --   HGB 13.0 12.0 12.5 11.9*  HCT 40.3 38.0 39.6 36.9  MCV 96.6 96.7 95.7 95.1  PLT 216 200 206 197   Cardiac Enzymes: No results for input(s): CKTOTAL, CKMB, CKMBINDEX, TROPONINI in the last 168 hours. BNP: BNP (last 3 results) Recent Labs    03/19/21 2320  BNP 295.0*    ProBNP (last 3 results) No results for input(s): PROBNP in the last 8760 hours.  CBG: No results for input(s): GLUCAP in the last 168 hours.     Signed:  Florencia Reasons MD, PhD, FACP  [facility] 03/22/2021, 11:32 AM

## 2021-03-22 NOTE — Progress Notes (Signed)
ANTICOAGULATION CONSULT NOTE   Pharmacy Consult for Heparin  Indication: chest pain/ACS  Labs: Recent Labs    03/19/21 2050 03/19/21 2320 03/20/21 0336 03/20/21 0712 03/20/21 1605 03/21/21 0215 03/22/21 0240  HGB 13.0  --  12.0  --   --  12.5 11.9*  HCT 40.3  --  38.0  --   --  39.6 36.9  PLT 216  --  200  --   --  206 197  HEPARINUNFRC  --   --   --    < > 0.21* 0.25* 0.45  CREATININE 0.64  --  0.65  --   --  0.68 0.58  TROPONINIHS 2,030* 1,869*  --   --   --   --   --    < > = values in this interval not displayed.   Assessment: 85 y/o F with elevated troponin. Consulted to start heparin. Pt does have a history of afib but has not been on any anti-coagulation. CBC/renal function good. PTA meds reviewed.   S/p cath with 3v CAD. Orders to resume heparin this afternoon.  Heparin level am after cath 0.45 units/ml  Goal of Therapy:  Heparin level 0.3-0.7 units/ml Monitor platelets by anticoagulation protocol: Yes   Plan:  Continue heparin at 1100 units/hr F/u daily HL, CBC Monitor for bleeding complications 8 hour heparin level Excell Seltzer PharmD Clinical Pharmacist 03/22/2021 6:14 AM

## 2021-03-22 NOTE — Plan of Care (Signed)

## 2021-03-22 NOTE — Consult Note (Signed)
[facility] CM Inpatient Consult   03/22/2021  [name] 10/07/1935 [id number]   [facility] [[facility]]  Accountable Care Organization [ACO] Patient: [name] Medicare  Primary Care Provider:  [name], MD  Patient is currently active with [facility] [[facility]] Care Management for chronic disease management services.  Patient has been engaged by a [facility] [employer].  Our community based plan of care has focused on disease management and community resource support.  Spoke with the patient and her daughter April regarding post hospital follow up with a [facility] Telephonic RN Care Coordinator and may transition back to a health coach when appropriate.  Patient and daughter verbalized understanding. A brief SDOH review and daughter states no issues and request all follow up appointments are early morning as she works second shift.  Plan: Reviewed  Inpatient Transition Of Care [TOC] team notes for needs.  Of note, [facility] Care Management services does not replace or interfere with any services that are needed or arranged by inpatient [facility] care management team.  For additional questions or referrals please contact:  Charlesetta Shanks, RN BSN CCM [facility]  [phone] business mobile phone Toll free office [phone]  Fax number: [phone] [email or web address]

## 2021-03-22 NOTE — Evaluation (Addendum)
Physical Therapy Evaluation Patient Details Name: [name] MRN: [id number] DOB: [birth year] Today's Date: 03/22/2021   History of Present Illness  pt is an 85 y/o female admitted 8/16 with report of high BP and back/chest pain, BP'sabnormal from her usual.  pt s/p heart cath.  Significant CAD noted, pt/family opted for medical management.  PMHx:  afib, Chronic back pain, COPD, HTN, HOH, neuromuscular d/o, tachy/brady syndrome.  Clinical Impression  Pt is at or close to baseline functioning and should be safe at home with intermittent. There are no further acute PT needs.  Will sign off at this time.     Follow Up Recommendations No PT follow up    Equipment Recommendations  None recommended by PT    Recommendations for Other Services       Precautions / Restrictions        Mobility  Bed Mobility Overal bed mobility: Modified Independent                  Transfers Overall transfer level: Modified independent               General transfer comment: cues for hand placement safety  Ambulation/Gait Ambulation/Gait assistance: Modified independent (Device/Increase time) Gait Distance (Feet): 200 Feet Assistive device: Straight cane Gait Pattern/deviations: Step-through pattern;Decreased step length - right;Decreased step length - left;Decreased stride length   Gait velocity interpretation: <1.8 ft/sec, indicate of risk for recurrent falls General Gait Details: mildly unsteady first 15 feet, having not ambulated for the past 2 days.  As she progress, pt was slower, generally steady though with short step lengths.  Mild deviation with scanning, but able to change direction and step over obstacles.  Pt/patient's daugter related that pt generally at her baseline function.  Stairs            Wheelchair Mobility    Modified Rankin (Stroke Patients Only)       Balance Overall balance assessment: Mild deficits observed, not formally tested                                            Pertinent Vitals/Pain Pain Assessment: No/denies pain    Home Living Family/patient expects to be discharged to:: Private residence Living Arrangements: Alone Available Help at Discharge: Family;Available PRN/intermittently Type of Home: Mobile home Home Access: Level entry     Home Layout: One level Home Equipment: Cane - single point;Bedside commode      Prior Function Level of Independence: Independent with assistive device(s)               Hand Dominance        Extremity/Trunk Assessment   Upper Extremity Assessment Upper Extremity Assessment: Overall WFL for tasks assessed    Lower Extremity Assessment Lower Extremity Assessment: Overall WFL for tasks assessed;Generalized weakness    Cervical / Trunk Assessment Cervical / Trunk Assessment: Kyphotic  Communication   Communication: No difficulties  Cognition Arousal/Alertness: Awake/alert Behavior During Therapy: WFL for tasks assessed/performed Overall Cognitive Status: Within Functional Limits for tasks assessed                                        General Comments General comments (skin integrity, edema, etc.): vss overall on RA, SpO2 97%, HR in the 80's with  minimal exertion in the halls.    Exercises     Assessment/Plan    PT Assessment Patent does not need any further PT services  PT Problem List         PT Treatment Interventions      PT Goals (Current goals can be found in the Care Plan section)  Acute Rehab PT Goals PT Goal Formulation: All assessment and education complete, DC therapy    Frequency     Barriers to discharge        Co-evaluation               AM-PAC PT "6 Clicks" Mobility  Outcome Measure Help needed turning from your back to your side while in a flat bed without using bedrails?: None Help needed moving from lying on your back to sitting on the side of a flat bed without using bedrails?:  None Help needed moving to and from a bed to a chair (including a wheelchair)?: None Help needed standing up from a chair using your arms (e.g., wheelchair or bedside chair)?: None Help needed to walk in hospital room?: None Help needed climbing 3-5 steps with a railing? : None 6 Click Score: 24    End of Session   Activity Tolerance: Patient tolerated treatment well Patient left: in bed;with call bell/phone within reach;with family/visitor present Nurse Communication: Mobility status PT Visit Diagnosis: Other abnormalities of gait and mobility (R26.89)    Time: 8295-6213 PT Time Calculation (min) (ACUTE ONLY): 23 min   Charges:   PT Evaluation $PT Eval Moderate Complexity: 1 Mod PT Treatments $Gait Training: 8-22 mins        03/22/2021  Jacinto Halim., PT Acute Rehabilitation Services [phone]  (pager) [phone]  (office)  Eliseo Gum Shayaan Parke 03/22/2021, 12:07 PM

## 2021-03-22 NOTE — Progress Notes (Signed)
Pt provided with verbal discharge instructions. Paper copy provided with to patient. Daughter at private vehicle. RN Jonelle Sidle, provide daughter with discharge instructions. VSS at discharge. IV's removed. Pt belongings sent with patient. NT discharge patient via wheelchair to private vehicle.

## 2021-03-22 NOTE — Care Management (Signed)
[facility] approved request for PT [facility]

## 2021-03-22 NOTE — TOC Transition Note (Signed)
Sp[facility] - CM/SW Discharge Note   Patient Details  Name: [name] MRN: [id number] Date of Birth: Apr 14, 1936  Transition of Care [facility]) CM/SW Contact:  Verdell Carmine, RN Phone Number: 03/22/2021, 12:25 PM   Clinical Narrative:     [name] to daughter April in the room with patient over the phone. She states that they would like [facility]. And specified Loma Sousa as the PT person they would want if possible. Called Amy at [facility] for referral.   Final next level of care: Home w Home Health Services Barriers to Discharge: No Barriers Identified   Patient Goals and CMS Choice        Discharge Placement               Home with Home Health        Discharge Plan and Services                          HH Arranged: PT [facility] Agency: [place] Date [facility] Agency Contacted: 03/22/21 Time HH Agency Contacted: 1200 Representative spoke with at [place]: Amy  Social Determinants of Health ([place]) Interventions     Readmission Risk Interventions No flowsheet data found.

## 2021-03-25 ENCOUNTER — Other Ambulatory Visit: Payer: Self-pay | Admitting: *Deleted

## 2021-03-25 DIAGNOSIS — I214 Non-ST elevation (NSTEMI) myocardial infarction: Secondary | ICD-10-CM | POA: Diagnosis not present

## 2021-03-25 DIAGNOSIS — I5032 Chronic diastolic (congestive) heart failure: Secondary | ICD-10-CM | POA: Diagnosis not present

## 2021-03-25 DIAGNOSIS — R627 Adult failure to thrive: Secondary | ICD-10-CM | POA: Diagnosis not present

## 2021-03-25 DIAGNOSIS — I169 Hypertensive crisis, unspecified: Secondary | ICD-10-CM | POA: Diagnosis not present

## 2021-03-25 DIAGNOSIS — I160 Hypertensive urgency: Secondary | ICD-10-CM | POA: Diagnosis not present

## 2021-03-25 DIAGNOSIS — I480 Paroxysmal atrial fibrillation: Secondary | ICD-10-CM | POA: Diagnosis not present

## 2021-03-25 DIAGNOSIS — I2510 Atherosclerotic heart disease of native coronary artery without angina pectoris: Secondary | ICD-10-CM | POA: Diagnosis not present

## 2021-03-25 DIAGNOSIS — R778 Other specified abnormalities of plasma proteins: Secondary | ICD-10-CM | POA: Diagnosis not present

## 2021-03-25 DIAGNOSIS — R9431 Abnormal electrocardiogram [ECG] [EKG]: Secondary | ICD-10-CM | POA: Diagnosis not present

## 2021-03-25 DIAGNOSIS — K869 Disease of pancreas, unspecified: Secondary | ICD-10-CM | POA: Diagnosis not present

## 2021-03-25 NOTE — Patient Outreach (Addendum)
[name]) Care Management  03/25/2021  [name] [name] 11, 1937 [id number]   EMMI-GENERAL DISCHARGE-RESOLVED RED ON EMMI ALERT Day #1 Date:03/24/2021 Red Alert Reason: NO FOLLOW UP APPOINTMENTS  All appointments discussed discussed with [name] (daughter) and informed caregiver this information was on her discharge sheet to review.   ________________________________________________________   Telephone Assessment-Successful-OTHER (MI) Transition of care to be completed by provider office.  Spoke with [name] and daughter for today's assessment. Discussed her recent discharge and how to avoid risk of readmission. Verified support system with niece ([name]) and daughter [name]) who lives out of town but both assist [name] with her care. [name] inquired on who to call with any emergencies and informed to call 911 with any emergencies if unsure aware to call her provider on call or her caregiver. Will send [facility] packet with RN hotline provider if needed prior to information packet arrives. Also discussed and verified Katrina awareness of any emergency and generated plan of care that was discussed for Katrina knowledge base.   Completed the assessment and activated a plan of care discussed today with [name] along with all interventions and goals. Will alert provider of Katrina disposition with [facility] services and due information on items discussed A.D packet encouraged [name] to completed with her daughters and [facility] calendar for all her readings with her daily monitoring of her HF/blood pressure readings which are currently under control.   Will follow up next month with ongoing management of care however remain available if needed prior to the follow up call. Answered all inquires and will remains available for any additional need. Will also provider contact number to the daughter [name]).   Goals Addressed             This Visit's Progress    [facility]--Make and Keep All Appointments       Timeframe:  Short-Term  Goal Priority:  Medium Start Date:    03/25/2021                         Expected End Date:  05/03/2021  Follow Up Date: 04/25/2021  Barriers: Health Behaviors Knowledge                        - ask family or friend for a ride - call to cancel if needed - keep a calendar with appointment dates    Why is this important?   Part of staying healthy is seeing the doctor for follow-up care.  If you forget your appointments, there are some things you can do to stay on track.    Notes:  8/22- Discussed with daughter [name] all pending appointments post d/c orders and verified [name] has sufficient transportation to all her pending appointments. [name] is also a caregiver and assist the [name] with her pill box refills and transporting [name] to all her medical appointments. In addition to running needed errands.      [facility]--Matintain My Quality of Life       Timeframe:  Long-Range Goal Priority:  Medium Start Date:  03/25/2021                           Expected End Date:    07/03/2021                   Follow Up Date 04/25/2021    - complete a living will - do  one enjoyable thing every day - make shared treatment decisions with doctor - name a health care proxy (decision maker) - spend time outdoors at least 3 times a week - strengthen or fix relationships with loved ones  Barriers: Health Behaviors Knowledge    Why is this important?   Having a long-term illness can be scary.  It can also be stressful for you and your caregiver.  These steps may help.    Notes:  8/22-Discussed healthy habits to improve quality of life and all goals with interventions noted above. Stress the importance of seek medical attention when needed with any acute symptoms of encounters.         Katrina Mina, RN Care Management Coordinator [place] Office [phone]

## 2021-03-29 DIAGNOSIS — R0602 Shortness of breath: Secondary | ICD-10-CM | POA: Diagnosis not present

## 2021-03-29 DIAGNOSIS — I2510 Atherosclerotic heart disease of native coronary artery without angina pectoris: Secondary | ICD-10-CM | POA: Diagnosis not present

## 2021-03-30 ENCOUNTER — Observation Stay (HOSPITAL_COMMUNITY)
Admission: EM | Admit: 2021-03-30 | Discharge: 2021-03-31 | Disposition: A | Discharge status: Home / Self Care | Payer: Medicare Other | Attending: Family Medicine | Admitting: Family Medicine

## 2021-03-30 ENCOUNTER — Other Ambulatory Visit: Payer: Self-pay

## 2021-03-30 ENCOUNTER — Encounter (HOSPITAL_COMMUNITY): Payer: Self-pay | Admitting: Family Medicine

## 2021-03-30 ENCOUNTER — Emergency Department (HOSPITAL_COMMUNITY): Payer: Medicare Other

## 2021-03-30 DIAGNOSIS — I2510 Atherosclerotic heart disease of native coronary artery without angina pectoris: Secondary | ICD-10-CM | POA: Diagnosis not present

## 2021-03-30 DIAGNOSIS — Z743 Need for continuous supervision: Secondary | ICD-10-CM | POA: Diagnosis not present

## 2021-03-30 DIAGNOSIS — I110 Hypertensive heart disease with heart failure: Secondary | ICD-10-CM | POA: Insufficient documentation

## 2021-03-30 DIAGNOSIS — I959 Hypotension, unspecified: Secondary | ICD-10-CM | POA: Diagnosis not present

## 2021-03-30 DIAGNOSIS — I517 Cardiomegaly: Secondary | ICD-10-CM | POA: Diagnosis not present

## 2021-03-30 DIAGNOSIS — R079 Chest pain, unspecified: Secondary | ICD-10-CM | POA: Diagnosis not present

## 2021-03-30 DIAGNOSIS — Z950 Presence of cardiac pacemaker: Secondary | ICD-10-CM | POA: Diagnosis not present

## 2021-03-30 DIAGNOSIS — R0689 Other abnormalities of breathing: Secondary | ICD-10-CM | POA: Diagnosis not present

## 2021-03-30 DIAGNOSIS — Z7901 Long term (current) use of anticoagulants: Secondary | ICD-10-CM | POA: Insufficient documentation

## 2021-03-30 DIAGNOSIS — I5032 Chronic diastolic (congestive) heart failure: Secondary | ICD-10-CM

## 2021-03-30 DIAGNOSIS — I495 Sick sinus syndrome: Secondary | ICD-10-CM

## 2021-03-30 DIAGNOSIS — Z7982 Long term (current) use of aspirin: Secondary | ICD-10-CM | POA: Diagnosis not present

## 2021-03-30 DIAGNOSIS — I10 Essential (primary) hypertension: Secondary | ICD-10-CM

## 2021-03-30 DIAGNOSIS — R072 Precordial pain: Secondary | ICD-10-CM

## 2021-03-30 DIAGNOSIS — Z87891 Personal history of nicotine dependence: Secondary | ICD-10-CM | POA: Diagnosis not present

## 2021-03-30 DIAGNOSIS — I4891 Unspecified atrial fibrillation: Secondary | ICD-10-CM | POA: Diagnosis not present

## 2021-03-30 DIAGNOSIS — I482 Chronic atrial fibrillation, unspecified: Secondary | ICD-10-CM | POA: Diagnosis present

## 2021-03-30 DIAGNOSIS — I480 Paroxysmal atrial fibrillation: Secondary | ICD-10-CM | POA: Diagnosis not present

## 2021-03-30 DIAGNOSIS — R6889 Other general symptoms and signs: Secondary | ICD-10-CM | POA: Diagnosis not present

## 2021-03-30 DIAGNOSIS — Z79899 Other long term (current) drug therapy: Secondary | ICD-10-CM | POA: Diagnosis not present

## 2021-03-30 DIAGNOSIS — R0789 Other chest pain: Secondary | ICD-10-CM | POA: Diagnosis not present

## 2021-03-30 DIAGNOSIS — R0602 Shortness of breath: Secondary | ICD-10-CM | POA: Diagnosis not present

## 2021-03-30 DIAGNOSIS — U071 COVID-19: Secondary | ICD-10-CM | POA: Diagnosis not present

## 2021-03-30 DIAGNOSIS — I25110 Atherosclerotic heart disease of native coronary artery with unstable angina pectoris: Secondary | ICD-10-CM | POA: Diagnosis present

## 2021-03-30 DIAGNOSIS — I5022 Chronic systolic (congestive) heart failure: Secondary | ICD-10-CM | POA: Diagnosis not present

## 2021-03-30 DIAGNOSIS — J449 Chronic obstructive pulmonary disease, unspecified: Secondary | ICD-10-CM | POA: Diagnosis not present

## 2021-03-30 LAB — TROPONIN I (HIGH SENSITIVITY)
Troponin I (High Sensitivity): 18 ng/L — ABNORMAL HIGH (ref <18)
Troponin I (High Sensitivity): 21 ng/L — ABNORMAL HIGH (ref <18)
Troponin I (High Sensitivity): 20 ng/L — ABNORMAL HIGH (ref <18)

## 2021-03-30 LAB — BASIC METABOLIC PANEL
Anion gap: 6 (ref 5–15)
BUN: 14 mg/dL (ref 8–23)
CO2: 28 mmol/L (ref 22–32)
Calcium: 9 mg/dL (ref 8.9–10.3)
Chloride: 100 mmol/L (ref 98–111)
Creatinine, Ser: 0.72 mg/dL (ref 0.44–1.00)
GFR, Estimated: >60 mL/min (ref >60)
Glucose, Bld: 97 mg/dL (ref 70–99)
Potassium: 3.8 mmol/L (ref 3.5–5.1)
Sodium: 134 mmol/L — ABNORMAL LOW (ref 135–145)

## 2021-03-30 LAB — CBC
HCT: 44.2 % (ref 36.0–46.0)
Hemoglobin: 14 g/dL (ref 12.0–15.0)
MCH: 30.7 pg (ref 26.0–34.0)
MCHC: 31.7 g/dL (ref 30.0–36.0)
MCV: 96.9 fL (ref 80.0–100.0)
Platelets: 253 10*3/uL (ref 150–400)
RBC: 4.56 MIL/uL (ref 3.87–5.11)
RDW: 13.7 % (ref 11.5–15.5)
WBC: 6.1 10*3/uL (ref 4.0–10.5)
nRBC: 0 % (ref 0.0–0.2)

## 2021-03-30 LAB — BRAIN NATRIURETIC PEPTIDE: B Natriuretic Peptide: 253 pg/mL — ABNORMAL HIGH (ref 0.0–100.0)

## 2021-03-30 LAB — RESP PANEL BY RT-PCR (FLU A&B, COVID) ARPGX2
Influenza A by PCR: NEGATIVE
Influenza B by PCR: NEGATIVE
SARS Coronavirus 2 by RT PCR: POSITIVE — AB

## 2021-03-30 MED ORDER — FUROSEMIDE 40 MG PO TABS: 40.0000 mg | ORAL_TABLET | Freq: Every day | ORAL | Status: DC | PRN

## 2021-03-30 MED ORDER — ATORVASTATIN CALCIUM 40 MG PO TABS
80.0000 mg | ORAL_TABLET | Freq: Every day | ORAL | Status: DC
  Administered 2021-03-30 – 2021-03-31 (×2): 80 mg via ORAL
  Filled 2021-03-30 (×2): qty 2

## 2021-03-30 MED ORDER — ASPIRIN 325 MG PO TABS
325.0000 mg | ORAL_TABLET | Freq: Once | ORAL | Status: AC
  Administered 2021-03-30: 325 mg via ORAL
  Filled 2021-03-30: qty 1

## 2021-03-30 MED ORDER — POTASSIUM CHLORIDE CRYS ER 20 MEQ PO TBCR
20.0000 meq | EXTENDED_RELEASE_TABLET | Freq: Every day | ORAL | Status: DC
  Administered 2021-03-31: 20 meq via ORAL
  Filled 2021-03-30: qty 1

## 2021-03-30 MED ORDER — AMIODARONE HCL 200 MG PO TABS: 200.0000 mg | ORAL_TABLET | ORAL | Status: DC

## 2021-03-30 MED ORDER — POTASSIUM CHLORIDE CRYS ER 20 MEQ PO TBCR: 20.0000 meq | EXTENDED_RELEASE_TABLET | ORAL | Status: DC

## 2021-03-30 MED ORDER — ONDANSETRON HCL 4 MG PO TABS: 4.0000 mg | ORAL_TABLET | Freq: Four times a day (QID) | ORAL | Status: DC | PRN

## 2021-03-30 MED ORDER — INSULIN ASPART 100 UNIT/ML IJ SOLN: 0.0000 [IU] | Freq: Three times a day (TID) | INTRAMUSCULAR | Status: DC

## 2021-03-30 MED ORDER — ISOSORBIDE MONONITRATE ER 60 MG PO TB24
90.0000 mg | ORAL_TABLET | Freq: Every day | ORAL | Status: DC
  Administered 2021-03-31: 90 mg via ORAL
  Filled 2021-03-30: qty 2

## 2021-03-30 MED ORDER — METOPROLOL SUCCINATE ER 50 MG PO TB24
50.0000 mg | ORAL_TABLET | Freq: Every day | ORAL | Status: DC
  Administered 2021-03-30: 50 mg via ORAL
  Filled 2021-03-30: qty 1

## 2021-03-30 MED ORDER — INSULIN ASPART 100 UNIT/ML IJ SOLN: 0.0000 [IU] | Freq: Every day | INTRAMUSCULAR | Status: DC

## 2021-03-30 MED ORDER — HYDRALAZINE HCL 25 MG PO TABS
25.0000 mg | ORAL_TABLET | Freq: Three times a day (TID) | ORAL | Status: DC
  Administered 2021-03-30: 25 mg via ORAL
  Filled 2021-03-30 (×2): qty 1

## 2021-03-30 MED ORDER — CLOPIDOGREL BISULFATE 75 MG PO TABS
75.0000 mg | ORAL_TABLET | Freq: Every day | ORAL | Status: DC
  Administered 2021-03-31: 75 mg via ORAL
  Filled 2021-03-30 (×2): qty 1

## 2021-03-30 MED ORDER — LORAZEPAM 2 MG/ML IJ SOLN
0.5000 mg | Freq: Once | INTRAMUSCULAR | Status: AC
  Administered 2021-03-30: 0.5 mg via INTRAVENOUS
  Filled 2021-03-30: qty 1

## 2021-03-30 MED ORDER — ENOXAPARIN SODIUM 40 MG/0.4ML IJ SOSY
40.0000 mg | PREFILLED_SYRINGE | INTRAMUSCULAR | Status: DC
  Administered 2021-03-30: 40 mg via SUBCUTANEOUS
  Filled 2021-03-30: qty 0.4

## 2021-03-30 MED ORDER — EMPAGLIFLOZIN 10 MG PO TABS
10.0000 mg | ORAL_TABLET | Freq: Every day | ORAL | Status: DC
  Administered 2021-03-31: 10 mg via ORAL
  Filled 2021-03-30 (×2): qty 1

## 2021-03-30 MED ORDER — IRBESARTAN 150 MG PO TABS
75.0000 mg | ORAL_TABLET | Freq: Every day | ORAL | Status: DC
  Filled 2021-03-30: qty 1

## 2021-03-30 MED ORDER — ISOSORBIDE MONONITRATE ER 30 MG PO TB24
30.0000 mg | ORAL_TABLET | Freq: Once | ORAL | Status: AC
  Administered 2021-03-30: 30 mg via ORAL
  Filled 2021-03-30: qty 1

## 2021-03-30 MED ORDER — PANTOPRAZOLE SODIUM 40 MG PO TBEC
40.0000 mg | DELAYED_RELEASE_TABLET | Freq: Every day | ORAL | Status: DC
  Administered 2021-03-31: 40 mg via ORAL
  Filled 2021-03-30 (×2): qty 1

## 2021-03-30 MED ORDER — PRIMIDONE 50 MG PO TABS
50.0000 mg | ORAL_TABLET | Freq: Every day | ORAL | Status: DC
  Administered 2021-03-30: 50 mg via ORAL
  Filled 2021-03-30: qty 1

## 2021-03-30 MED ORDER — ALBUTEROL SULFATE HFA 108 (90 BASE) MCG/ACT IN AERS: 1.0000 | INHALATION_SPRAY | RESPIRATORY_TRACT | Status: DC | PRN

## 2021-03-30 MED ORDER — METOPROLOL SUCCINATE ER 50 MG PO TB24: 75.0000 mg | ORAL_TABLET | Freq: Two times a day (BID) | ORAL | Status: DC

## 2021-03-30 MED ORDER — SENNOSIDES-DOCUSATE SODIUM 8.6-50 MG PO TABS
1.0000 | ORAL_TABLET | Freq: Every day | ORAL | Status: DC
  Filled 2021-03-30: qty 1

## 2021-03-30 MED ORDER — METOPROLOL SUCCINATE ER 50 MG PO TB24
75.0000 mg | ORAL_TABLET | Freq: Every day | ORAL | Status: DC
  Administered 2021-03-31: 75 mg via ORAL
  Filled 2021-03-30 (×2): qty 1

## 2021-03-30 MED ORDER — AMIODARONE HCL 200 MG PO TABS: 200.0000 mg | ORAL_TABLET | Freq: Every day | ORAL | Status: DC

## 2021-03-30 NOTE — Plan of Care (Signed)
  Problem: Activity: Goal: Risk for activity intolerance will decrease Outcome: Progressing   Problem: Coping: Goal: Level of anxiety will decrease Outcome: Not Progressing   Problem: Pain Managment: Goal: General experience of comfort will improve Outcome: Progressing

## 2021-03-30 NOTE — H&P (Signed)
History and Physical  [name] [id number] DOB: November 21, 1935 DOA: 03/30/2021  Referring physician: Coral Ceo, PA-C, EDP PCP: Celene Squibb, MD  Outpatient Specialists:  [facility]  Patient Coming From: home  Chief Complaint: Chest pain  HPI: [name] is a 85 y.o. female with a history of coronary artery disease with recent NSTEMI with admission from 8/16-8/19, atrial fibrillation on amiodarone COPD, hypertension, hyperlipidemia, tachycardic-bradycardia syndrome with permanent pacemaker.  During the patient's last hospitalization, she had a cardiac cath which showed three-vessel disease and was offered a three-vessel CABG versus aggressive medical therapy.  At the time, the patient opted for medical management.  She has been compliant with her medications since discharge patient has been having intermittent chest pain over the past week.  No provoking factors.  Nitroglycerin does provide some relief.  Denies increase in chest pain with activity.  Denies shortness of breath, edema, cough, orthopnea.  Emergency Department Course: Troponin 18, 21 on repeat.  Chest x-ray normal.  Other labs normal.  EDP talked with cardiology who offered CABG versus medical management.  Conversations with the daughter and patient resulted in the patient choosing medical management.  Review of Systems:   Pt denies any fevers, chills, nausea, vomiting, diarrhea, constipation, abdominal pain, shortness of breath, dyspnea on exertion, orthopnea, cough, wheezing, palpitations, headache, vision changes, lightheadedness, dizziness, melena, rectal bleeding.  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   CATARACT EXTRACTION     CHOLECYSTECTOMY     COLONOSCOPY  2011   COLONOSCOPY N/A 10/19/2014   Procedure: COLONOSCOPY;  Surgeon: Rogene Houston, MD;  [facility];  Service: Endoscopy;  Laterality: N/A;  1030   ESOPHAGEAL DILATION N/A 05/02/2020   Procedure: ESOPHAGEAL DILATION;  Surgeon: Rogene Houston, MD;  Location: [facility];  Service: Gastroenterology;  Laterality: N/A;   ESOPHAGOGASTRODUODENOSCOPY N/A 10/26/2014   Procedure: ESOPHAGOGASTRODUODENOSCOPY (EGD);  Surgeon: Rogene Houston, MD;  [facility]: [facility];  Service: Endoscopy;  Laterality: N/A;  23 - Dr. has lunch and learn   ESOPHAGOGASTRODUODENOSCOPY (EGD) WITH PROPOFOL N/A 05/02/2020   Procedure: ESOPHAGOGASTRODUODENOSCOPY (EGD) WITH PROPOFOL;  Surgeon: Rogene Houston, MD;  [facility];  Service: Gastroenterology;  Laterality: N/A;  250   Esophagogastroduodenoscopy with esophageal dilation  2004, 2006, 2007   GIVENS CAPSULE STUDY N/A 10/31/2014   Procedure: GIVENS CAPSULE STUDY;  Surgeon: Rogene Houston, MD;  [facility]: [facility];  Service: Endoscopy;  Laterality: N/A;  730 -- pacemaker--needs monitoring--outpatient bed   INSERT / REPLACE /  REMOVE PACEMAKER  2010   LEFT HEART CATH AND CORONARY ANGIOGRAPHY N/A 03/21/2021   Procedure: LEFT HEART CATH AND CORONARY ANGIOGRAPHY;  Surgeon: Belva Crome, MD;  Location: [place] [facility];  Service: Cardiovascular;  Laterality: N/A;   LEFT HEART  CATHETERIZATION WITH CORONARY ANGIOGRAM N/A 11/17/2011   Procedure: LEFT HEART CATHETERIZATION WITH CORONARY ANGIOGRAM;  Surgeon: Sherren Mocha, MD;  Location: [facility] CATH LAB;  Service: Cardiovascular;  Laterality: N/A;   LEFT HEART CATHETERIZATION WITH CORONARY ANGIOGRAM N/A 09/26/2014   Procedure: LEFT HEART CATHETERIZATION WITH CORONARY ANGIOGRAM;  Surgeon: Leonie Man, MD;  Location: [facility] CATH LAB;  Service: Cardiovascular;  Laterality: N/A;   Right rotator cuff repair     SHOULDER ACROMIOPLASTY Right 05/30/2015   Procedure: RIGHT SHOULDER ACROMIOPLASTY;  Surgeon: Carole Civil, MD;  Location: [facility];  Service: Orthopedics;  Laterality: Right;   SHOULDER OPEN ROTATOR CUFF REPAIR Right 05/30/2015   Procedure: OPEN ROTATOR CUFF REPAIR RIGHT SHOULDER;  Surgeon: Carole Civil, MD;  Location: [facility];  Service: Orthopedics;  Laterality: Right;   SHOULDER OPEN ROTATOR CUFF REPAIR Left 10/22/2016   Procedure: ROTATOR CUFF REPAIR SHOULDER OPEN;  Surgeon: Carole Civil, MD;  Location: [facility];  Service: Orthopedics;  Laterality: Left;   Subxiphoid pericardial window  11/10   VAGINAL HYSTERECTOMY     Social History:  reports that she quit smoking about 31 years ago. Her smoking use included cigarettes. She has a 20.00 pack-year smoking history. She has never used smokeless tobacco. She reports that she does not drink alcohol and does not use drugs. Patient lives at home  Allergies  Allergen Reactions   Amitriptyline Hcl Other (See Comments)    Caused "jaws to twist and lock"   Sulfonamide Derivatives Other (See Comments)    UNKNOWN REACTION    Family History  Problem Relation Age of Onset   Cancer Mother        Colon    Coronary artery disease Sister    Coronary artery disease Brother    Arthritis Other    Lung disease Other    Asthma Other       Prior to Admission medications   Medication Sig Start Date End Date Taking? Authorizing Provider  amiodarone (PACERONE) 200 MG  tablet Take 200 mg Daily Monday - Saturday None on Sunday 07/06/20   Evans Lance, MD  amLODipine (NORVASC) 10 MG tablet Take 1 tablet (10 mg total) by mouth daily. 03/23/21   Florencia Reasons, MD  aspirin EC 81 MG tablet Take 1 tablet (81 mg total) by mouth daily. Swallow whole. 07/06/20   Evans Lance, MD  atorvastatin (LIPITOR) 80 MG tablet Take 1 tablet (80 mg total) by mouth daily. 03/23/21   Florencia Reasons, MD  clopidogrel (PLAVIX) 75 MG tablet Take 1 tablet (75 mg total) by mouth daily. 03/23/21   Florencia Reasons, MD  Cyanocobalamin (VITAMIN B-12) 5000 MCG SUBL Take 5,000 mcg by mouth daily.    [provider]  folic acid (FOLVITE) A999333 MCG tablet Take 400 mcg by mouth daily.    [provider]  furosemide (LASIX) 40 MG tablet Take 1 tablet (40 mg total) by mouth daily as needed for fluid or edema. 03/22/21   Florencia Reasons, MD  hydrALAZINE (APRESOLINE) 25 MG tablet Take 1 tablet (25 mg total) by mouth 3 (three) times daily. 03/22/21   Florencia Reasons, MD  irbesartan (AVAPRO) 150 MG tablet TAKE (1) TABLET BY MOUTH ONCE DAILY. 10/04/20  Satira Sark, MD  isosorbide mononitrate (IMDUR) 60 MG 24 hr tablet Take 1 tablet (60 mg total) by mouth daily. 03/23/21   Florencia Reasons, MD  JARDIANCE 10 MG TABS tablet Take 10 mg by mouth daily. 02/07/21   [provider]  LORazepam (ATIVAN) 0.5 MG tablet Take 0.5 mg by mouth 2 (two) times daily.  Patient not taking: No sig reported 05/22/20   [provider]  metoprolol succinate (TOPROL-XL) 25 MG 24 hr tablet Take 3 tablets (75 mg total) by mouth in the morning. & 50 mg in the evening 05/30/20   Satira Sark, MD  Multiple Vitamin (MULTIVITAMIN WITH MINERALS) TABS tablet Take 1 tablet by mouth daily.     [provider]  nitroGLYCERIN (NITROSTAT) 0.4 MG SL tablet Place 1 tablet (0.4 mg total) under the tongue every 5 (five) minutes x 3 doses as needed for chest pain. 03/22/21   Florencia Reasons, MD  ondansetron (ZOFRAN) 4 MG tablet Take 4 mg by mouth  every 6 (six) hours as needed for nausea or vomiting.  03/12/20   [provider]  pantoprazole (PROTONIX) 40 MG tablet Take 1 tablet (40 mg total) by mouth daily. 03/23/21   Florencia Reasons, MD  potassium chloride SA (K-DUR,KLOR-CON) 20 MEQ tablet Take 20 mEq by mouth See admin instructions. Takes 1 tablet (20 meq) by mouth daily (scheduled) & a 2nd tablet only if has to take a 2nd dose of Lasix    Rexene Alberts, MD  primidone (MYSOLINE) 50 MG tablet Take 50 mg by mouth at bedtime. 10/31/10   [provider]  PROAIR HFA 108 (90 Base) MCG/ACT inhaler Inhale 1 puff into the lungs every 4 (four) hours as needed for wheezing or shortness of breath.  11/10/18   [provider]  senna-docusate (SENOKOT-S) 8.6-50 MG tablet Take 1 tablet by mouth at bedtime. 03/22/21   Florencia Reasons, MD  VITAMIN D, CHOLECALCIFEROL, PO Take 1 tablet by mouth daily.    [provider]    Physical Exam: BP (!) 147/71 (BP Location: Left Arm)   Pulse 61   Temp 98 F (36.7 C) (Oral)   Resp 20   Ht '5\' 5"'$  (1.651 m)   Wt 61 kg   SpO2 96%   BMI 22.38 kg/m   General: Elderly female. Awake and alert and oriented x3. No acute cardiopulmonary distress.  HEENT: Normocephalic atraumatic.  Right and left ears normal in appearance.  Pupils equal, round, reactive to light. Extraocular muscles are intact. Sclerae anicteric and noninjected.  Moist mucosal membranes. No mucosal lesions.  Neck: Neck supple without lymphadenopathy. No carotid bruits. No masses palpated.  Cardiovascular: Regular rate with normal S1-S2 sounds. No murmurs, rubs, gallops auscultated. No JVD.  Respiratory: Good respiratory effort with no wheezes, rales, rhonchi. Lungs clear to auscultation bilaterally.  No accessory muscle use. Abdomen: Soft, nontender, nondistended. Active bowel sounds. No masses or hepatosplenomegaly  Skin: No rashes, lesions, or ulcerations.  Dry, warm to touch. 2+ dorsalis pedis and radial pulses. Musculoskeletal: No  calf or leg pain. All major joints not erythematous nontender.  No upper or lower joint deformation.  Good ROM.  No contractures  Psychiatric: Intact judgment and insight. Pleasant and cooperative. Neurologic: No focal neurological deficits. Strength is 5/5 and symmetric in upper and lower extremities.  Cranial nerves II through XII are grossly intact.           Labs on Admission: I have personally reviewed following labs and imaging studies  CBC: Recent Labs  Lab 03/30/21 1354  WBC 6.1  HGB 14.0  HCT 44.2  MCV 96.9  PLT 123456   Basic Metabolic Panel: Recent Labs  Lab 03/30/21 1354  NA 134*  K 3.8  CL 100  CO2 28  GLUCOSE 97  BUN 14  CREATININE 0.72  CALCIUM 9.0   GFR: Estimated Creatinine Clearance: 47.9 mL/min (by C-G formula based on SCr of 0.72 mg/dL). Liver Function Tests: No results for input(s): AST, ALT, ALKPHOS, BILITOT, PROT, ALBUMIN in the last 168 hours. No results for input(s): LIPASE, AMYLASE in the last 168 hours. No results for input(s): AMMONIA in the last 168 hours. Coagulation Profile: No results for input(s): INR, PROTIME in the last 168 hours. Cardiac Enzymes: No results for input(s): CKTOTAL, CKMB, CKMBINDEX, TROPONINI in the last 168 hours. BNP (last 3 results) No results for input(s): PROBNP in the last 8760 hours. HbA1C: No results for input(s): HGBA1C in the last 72 hours. CBG: No results for input(s): GLUCAP in the last 168 hours. Lipid Profile: No results for input(s): CHOL, HDL, LDLCALC, TRIG, CHOLHDL, LDLDIRECT in the last 72 hours. Thyroid Function Tests: No results for input(s): TSH, T4TOTAL, FREET4, T3FREE, THYROIDAB in the last 72 hours. Anemia Panel: No results for input(s): VITAMINB12, FOLATE, FERRITIN, TIBC, IRON, RETICCTPCT in the last 72 hours. Urine analysis:    Component Value Date/Time   COLORURINE YELLOW 12/07/2009 1532   APPEARANCEUR CLEAR 12/07/2009 1532   LABSPEC 1.010 12/07/2009 1532   PHURINE 6.5 12/07/2009 1532    GLUCOSEU NEGATIVE 12/07/2009 1532   HGBUR NEGATIVE 12/07/2009 1532   BILIRUBINUR NEGATIVE 12/07/2009 1532   KETONESUR NEGATIVE 12/07/2009 1532   PROTEINUR NEGATIVE 12/07/2009 1532   UROBILINOGEN 0.2 12/07/2009 1532   NITRITE NEGATIVE 12/07/2009 1532   LEUKOCYTESUR  12/07/2009 1532    NEGATIVE MICROSCOPIC NOT DONE ON URINES WITH NEGATIVE PROTEIN, BLOOD, LEUKOCYTES, NITRITE, OR GLUCOSE <1000 mg/dL.   Sepsis Labs: '@LABRCNTIP'$ (procalcitonin:4,lacticidven:4) )No results found for this or any previous visit (from the past 240 hour(s)).   Radiological Exams on Admission: DG Chest 1 View  Result Date: 03/30/2021 CLINICAL DATA:  Shortness of breath.  Recent MI. EXAM: CHEST  1 VIEW COMPARISON:  March 19, 2021 FINDINGS: The 2 lead pacemaker is in stable position. No pneumothorax. Stable cardiomegaly. The hila and mediastinum are unchanged. No pneumothorax. No nodules or masses. No focal infiltrates. No overt edema. IMPRESSION: No active disease. Electronically Signed   By: Dorise Bullion III M.D.   On: 03/30/2021 14:34    EKG: Independently reviewed.  Atrial paced rhythm.  Possible old inferior infarct.  No acute ST changes.  Assessment/Plan: Principal Problem:   Chest pain Active Problems:   Essential hypertension, benign   CAD (coronary artery disease), native coronary artery   BRADYCARDIA-TACHYCARDIA SYNDROME   Chronic diastolic heart failure (HCC)   Afib (HCC)   Benign essential hypertension    This patient was discussed with the ED physician, including pertinent vitals, physical exam findings, labs, and imaging.  We also discussed care given by the ED provider.  Chest pain in the setting of known coronary artery disease Observation I also had a lengthy conversation with the patient and her daughter.  The patient appeared to be waffling between surgery and medical management.  At this point, we decided to continue with medical management and see how her symptoms were in the morning.   If her symptoms were resolved, then patient could return home.  If symptoms persisted, would need to reconsult  cardiology Will titrate Imdur to 90 mg.  Will give patient additional 30 mg tonight. Will also increase metoprolol 75 mg twice daily Patient extremely concerned about hypotension, which she appears to be having.  We will hold amlodipine for now. Continue to watch troponins overnight A. fib with bradycardia tachycardia syndrome and chronic diastolic heart failure Rate controlled -patient has pacemaker Continue amiodarone and metoprolol Hypertension  GERD Protonix  DVT prophylaxis: Lovenox Consultants: None Code Status: Full code Family Communication: Discussion with patient's daughter Disposition Plan: Pending   Truett Mainland, DO

## 2021-03-30 NOTE — ED Triage Notes (Signed)
Pt had MI on 8/16 pt had CP took a nitro, pt has anxiety, alert and oriented, hard of hearing.  Tremors normal for pt.  Pt vital stable at this time

## 2021-03-30 NOTE — ED Provider Notes (Signed)
[facility] EMERGENCY DEPARTMENT Provider Note   CSN: [id number] Arrival date & time: 03/30/21  1258     History Chief Complaint  Patient presents with   Chest Pain    [name] is a 85 y.o. female.  HPI  85 year old female with a history of anemia, anxiety, A. fib, COPD, CAD, [name] syndrome, hypertension, GERD, hiatal hernia, hyperlipidemia, pericardial effusion, pacemaker, tachybradycardia syndrome, who presents the emergency department today for evaluation of chest pain and shortness of breath.  Patient states that symptoms have been ongoing for greater than a week.  She is actually admitted to [facility] last week and underwent a catheterization which showed three-vessel disease.  At that time she was offered CABG versus aggressive medical therapy however opted to have medical therapy at that time.  She presents today with persistent symptoms despite taking her medications.  She has been taking nitroglycerin with some relief.  There have been no increased leg swelling, coughing  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[place])    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    Essential hypertension    GERD (gastroesophageal reflux disease)    H/O hiatal hernia    Headache(784.0)    HOH (hard of hearing)    Hyperlipidemia    Neuromuscular disorder (HCC)    Tremors   Pericardial effusion    Hemorrhagic    Presence of permanent cardiac pacemaker    Tachycardia-bradycardia syndrome ([place])    s/p Medtronic Adapta L dual chamber device  5/10    Patient Active Problem List   Diagnosis Date Noted   Non-STEMI (non-ST elevated myocardial infarction) ([place])    Hypertensive urgency    Pancreatic lesion 03/20/2021   Hypertensive crisis 03/19/2021   Elevated troponin 03/19/2021    Prolonged QT interval 03/19/2021   Esophageal dysphagia 07/03/2020   Nonspecific chest pain 09/15/2016   Complete rotator cuff tear of left shoulder    Anemia 10/31/2014   H/O heart artery stent 10/30/2014   Chest pain with moderate risk for cardiac etiology [id number]   Diastolic dysfunction with chronic heart failure ([place]) 08/23/2014   Chronic diastolic heart failure ([place]) 08/23/2014   Acute chest pain 08/23/2014   Acquired obstruction of both nasolacrimal ducts 06/15/2014   PERICARDIAL EFFUSION 07/10/2009   Disease of pericardium 07/10/2009   Essential hypertension, benign 04/18/2009   CAD (coronary artery disease), native coronary artery 04/18/2009   Benign essential hypertension 04/18/2009   PPM-Medtronic 04/04/2009   BRADYCARDIA-TACHYCARDIA SYNDROME 01/26/2009   HLD (hyperlipidemia) 12/19/2008   Anxiety 12/19/2008   Atrial fibrillation ([place]) 12/19/2008   GERD 12/19/2008   Abnormal involuntary movements 12/19/2008   Afib ([place]) 12/19/2008   Familial multiple lipoprotein-type hyperlipidemia 12/19/2008    Past Surgical History:  Procedure Laterality Date   APPENDECTOMY     CARDIAC CATHETERIZATION  2010   stents x2.   CATARACT EXTRACTION     CHOLECYSTECTOMY     COLONOSCOPY  2011   COLONOSCOPY N/A 10/19/2014   Procedure: COLONOSCOPY;  Surgeon: Rogene Houston, MD;  [facility];  Service: Endoscopy;  Laterality: N/A;  1030   ESOPHAGEAL DILATION N/A 05/02/2020   Procedure: ESOPHAGEAL DILATION;  Surgeon: Rogene Houston, MD;  Location: [facility];  Service: Gastroenterology;  Laterality: N/A;   ESOPHAGOGASTRODUODENOSCOPY N/A 10/26/2014   Procedure: ESOPHAGOGASTRODUODENOSCOPY (EGD);  Surgeon: Rogene Houston, MD;  Location: [facility];  Service: Endoscopy;  Laterality: N/A;  39 - Dr. has lunch and learn   ESOPHAGOGASTRODUODENOSCOPY (EGD) WITH PROPOFOL N/A 05/02/2020   Procedure: ESOPHAGOGASTRODUODENOSCOPY (EGD) WITH PROPOFOL;  Surgeon: Rogene Houston, MD;   Location[facility];  Service: Gastroenterology;  Laterality: N/A;  250   Esophagogastroduodenoscopy with esophageal dilation  2004, 2006, 2007   GIVENS CAPSULE STUDY N/A 10/31/2014   Procedure: GIVENS CAPSULE STUDY;  Surgeon: Rogene Houston, MD;  Location: [facility];  Service: Endoscopy;  Laterality: N/A;  730 -- pacemaker--needs monitoring--outpatient bed   INSERT / REPLACE / REMOVE PACEMAKER  2010   LEFT HEART CATH AND CORONARY ANGIOGRAPHY N/A 03/21/2021   Procedure: LEFT HEART CATH AND CORONARY ANGIOGRAPHY;  Surgeon: Belva Crome, MD;  Location: [place] CV LAB;  Service: Cardiovascular;  Laterality: N/A;   LEFT HEART CATHETERIZATION WITH CORONARY ANGIOGRAM N/A 11/17/2011   Procedure: LEFT HEART CATHETERIZATION WITH CORONARY ANGIOGRAM;  Surgeon: Sherren Mocha, MD;  Location: [facility] CATH LAB;  Service: Cardiovascular;  Laterality: N/A;   LEFT HEART CATHETERIZATION WITH CORONARY ANGIOGRAM N/A 09/26/2014   Procedure: LEFT HEART CATHETERIZATION WITH CORONARY ANGIOGRAM;  Surgeon: Leonie Man, MD;  Location: [facility] CATH LAB;  Service: Cardiovascular;  Laterality: N/A;   Right rotator cuff repair     SHOULDER ACROMIOPLASTY Right 05/30/2015   Procedure: RIGHT SHOULDER ACROMIOPLASTY;  Surgeon: Carole Civil, MD;  Location: [facility] ORS;  Service: Orthopedics;  Laterality: Right;   SHOULDER OPEN ROTATOR CUFF REPAIR Right 05/30/2015   Procedure: OPEN ROTATOR CUFF REPAIR RIGHT SHOULDER;  Surgeon: Carole Civil, MD;  Location: [facility] ORS;  Service: Orthopedics;  Laterality: Right;   SHOULDER OPEN ROTATOR CUFF REPAIR Left 10/22/2016   Procedure: ROTATOR CUFF REPAIR SHOULDER OPEN;  Surgeon: Carole Civil, MD;  Location: [facility] ORS;  Service: Orthopedics;  Laterality: Left;   Subxiphoid pericardial window  11/10   VAGINAL HYSTERECTOMY       OB History     Gravida  6   Para  5   Term  5   Preterm      AB  1   Living  3      SAB  1   IAB      Ectopic      Multiple      Live  Births              Family History  Problem Relation Age of Onset   Cancer Mother        Colon    Coronary artery disease Sister    Coronary artery disease Brother    Arthritis Other    Lung disease Other    Asthma Other     Social History   Tobacco Use   Smoking status: Former    Packs/day: 2.00    Years: 10.00    Pack years: 20.00    Types: Cigarettes    Quit date: 05/23/1989    Years since quitting: 31.8   Smokeless tobacco: Never  Vaping Use   Vaping Use: Never used  Substance Use Topics   Alcohol use: No    Alcohol/week: 0.0 standard drinks   Drug use: No    Home Medications Prior to Admission medications   Medication Sig Start Date End Date Taking? Authorizing Provider  amiodarone (PACERONE) 200 MG tablet Take 200  mg Daily Monday - Saturday None on Sunday 07/06/20   Evans Lance, MD  amLODipine (NORVASC) 10 MG tablet Take 1 tablet (10 mg total) by mouth daily. 03/23/21   Florencia Reasons, MD  aspirin EC 81 MG tablet Take 1 tablet (81 mg total) by mouth daily. Swallow whole. 07/06/20   Evans Lance, MD  atorvastatin (LIPITOR) 80 MG tablet Take 1 tablet (80 mg total) by mouth daily. 03/23/21   Florencia Reasons, MD  clopidogrel (PLAVIX) 75 MG tablet Take 1 tablet (75 mg total) by mouth daily. 03/23/21   Florencia Reasons, MD  Cyanocobalamin (VITAMIN B-12) 5000 MCG SUBL Take 5,000 mcg by mouth daily.    [provider]  folic acid (FOLVITE) A999333 MCG tablet Take 400 mcg by mouth daily.    [provider]  furosemide (LASIX) 40 MG tablet Take 1 tablet (40 mg total) by mouth daily as needed for fluid or edema. 03/22/21   Florencia Reasons, MD  hydrALAZINE (APRESOLINE) 25 MG tablet Take 1 tablet (25 mg total) by mouth 3 (three) times daily. 03/22/21   Florencia Reasons, MD  irbesartan (AVAPRO) 150 MG tablet TAKE (1) TABLET BY MOUTH ONCE DAILY. 10/04/20   Satira Sark, MD  isosorbide mononitrate (IMDUR) 60 MG 24 hr tablet Take 1 tablet (60 mg total) by mouth daily. 03/23/21   Florencia Reasons, MD   JARDIANCE 10 MG TABS tablet Take 10 mg by mouth daily. 02/07/21   [provider]  LORazepam (ATIVAN) 0.5 MG tablet Take 0.5 mg by mouth 2 (two) times daily.  Patient not taking: No sig reported 05/22/20   [provider]  metoprolol succinate (TOPROL-XL) 25 MG 24 hr tablet Take 3 tablets (75 mg total) by mouth in the morning. & 50 mg in the evening 05/30/20   Satira Sark, MD  Multiple Vitamin (MULTIVITAMIN WITH MINERALS) TABS tablet Take 1 tablet by mouth daily.     [provider]  nitroGLYCERIN (NITROSTAT) 0.4 MG SL tablet Place 1 tablet (0.4 mg total) under the tongue every 5 (five) minutes x 3 doses as needed for chest pain. 03/22/21   Florencia Reasons, MD  ondansetron (ZOFRAN) 4 MG tablet Take 4 mg by mouth every 6 (six) hours as needed for nausea or vomiting.  03/12/20   [provider]  pantoprazole (PROTONIX) 40 MG tablet Take 1 tablet (40 mg total) by mouth daily. 03/23/21   Florencia Reasons, MD  potassium chloride SA (K-DUR,KLOR-CON) 20 MEQ tablet Take 20 mEq by mouth See admin instructions. Takes 1 tablet (20 meq) by mouth daily (scheduled) & a 2nd tablet only if has to take a 2nd dose of Lasix    Rexene Alberts, MD  primidone (MYSOLINE) 50 MG tablet Take 50 mg by mouth at bedtime. 10/31/10   [provider]  PROAIR HFA 108 (90 Base) MCG/ACT inhaler Inhale 1 puff into the lungs every 4 (four) hours as needed for wheezing or shortness of breath.  11/10/18   [provider]  senna-docusate (SENOKOT-S) 8.6-50 MG tablet Take 1 tablet by mouth at bedtime. 03/22/21   Florencia Reasons, MD  VITAMIN D, CHOLECALCIFEROL, PO Take 1 tablet by mouth daily.    [provider]    Allergies    Amitriptyline hcl and Sulfonamide derivatives  Review of Systems   Review of Systems  Constitutional:  Negative for fever.  HENT:  Negative for ear pain and sore throat.   Eyes:  Negative for visual disturbance.  Respiratory:  Positive for shortness of breath. Negative  for cough.   Cardiovascular:  Positive for chest pain.  Gastrointestinal:  Negative for abdominal pain, constipation, diarrhea, nausea and vomiting.  Genitourinary:  Negative for dysuria and hematuria.  Musculoskeletal:  Negative for back pain.  Skin:  Negative for rash.  Neurological:  Negative for headaches.  All other systems reviewed and are negative.  Physical Exam Updated Vital Signs BP (!) 160/70 (BP Location: Right Arm)   Pulse 68   Temp 97.8 F (36.6 C) (Oral)   Resp 18   Ht '5\' 5"'$  (1.651 m)   Wt 61 kg   SpO2 98%   BMI 22.38 kg/m   Physical Exam Vitals and nursing note reviewed.  Constitutional:      General: She is not in acute distress.    Appearance: She is well-developed.  HENT:     Head: Normocephalic and atraumatic.  Eyes:     Conjunctiva/sclera: Conjunctivae normal.  Cardiovascular:     Rate and Rhythm: Normal rate and regular rhythm.     Heart sounds: No murmur heard. Pulmonary:     Effort: Pulmonary effort is normal. No respiratory distress.     Breath sounds: Normal breath sounds. No decreased breath sounds, wheezing, rhonchi or rales.  Abdominal:     General: Bowel sounds are normal.     Palpations: Abdomen is soft.     Tenderness: There is no abdominal tenderness.  Musculoskeletal:     Cervical back: Neck supple.     Right lower leg: No tenderness. No edema.     Left lower leg: No tenderness. No edema.  Skin:    General: Skin is warm and dry.  Neurological:     Mental Status: She is alert.  Psychiatric:     Comments: anxious    ED Results / Procedures / Treatments   Labs (all labs ordered are listed, but only abnormal results are displayed) Labs Reviewed  BASIC METABOLIC PANEL - Abnormal; Notable for the following components:      Result Value   Sodium 134 (*)    All other components within normal limits  BRAIN NATRIURETIC PEPTIDE - Abnormal; Notable for the following components:   B Natriuretic Peptide 253.0 (*)    All other  components within normal limits  TROPONIN I (HIGH SENSITIVITY) - Abnormal; Notable for the following components:   Troponin I (High Sensitivity) 18 (*)    All other components within normal limits  RESP PANEL BY RT-PCR (FLU A&B, COVID) [id number]  CBC  TROPONIN I (HIGH SENSITIVITY)    EKG EKG Interpretation  Date/Time:  Saturday March 30 2021 13:14:54 EDT Ventricular Rate:  61 PR Interval:  232 QRS Duration: 88 QT Interval:  456 QTC Calculation: 459 R Axis:   33 Text Interpretation: Atrial-paced rhythm with prolonged AV conduction Cannot rule out Anterior infarct , age undetermined Abnormal ECG Confirmed by Nanda Quinton [phone]) on 03/30/2021 2:12:00 PM  Radiology DG Chest 1 View  Result Date: 03/30/2021 CLINICAL DATA:  Shortness of breath.  Recent MI. EXAM: CHEST  1 VIEW COMPARISON:  March 19, 2021 FINDINGS: The 2 lead pacemaker is in stable position. No pneumothorax. Stable cardiomegaly. The hila and mediastinum are unchanged. No pneumothorax. No nodules or masses. No focal infiltrates. No overt edema. IMPRESSION: No active disease. Electronically Signed   By: Dorise Bullion III M.D.   On: 03/30/2021 14:34    Procedures Procedures   Medications Ordered in ED Medications  LORazepam (ATIVAN) injection 0.5 mg (has  no administration in time range)  aspirin tablet 325 mg (has no administration in time range)    ED Course  I have reviewed the triage vital signs and the nursing notes.  Pertinent labs & imaging results that were available during my care of the patient were reviewed by me and considered in my medical decision making (see chart for details).    MDM Rules/Calculators/A&P  85 year old female presenting the emergency department today for evaluation of shortness of breath and chest pain.  Found to have three-vessel disease and was offered CABG versus aggressive medical therapy.  She opted for aggressive medical therapy but presents with persistent symptoms  today  Reviewed/interpreted labs CBC is unremarkable BMP with mild hypokalemia BNP is marginally elevated Troponin is marginally elevated, likely still downtrending from last week  EKG shows a paced rhythm with prolonged AV conduction, age undetermined possible anterior infarct  Reviewed/interpreted imaging CXR - No active disease.   3:02 PM CONSULT with Dr. Radford Pax with cardiology. She recommends having discussion with patient about going forward with CABG versus further titrating her medications.   Spoke with pt and her daugther, [name] at bedside. We talked about options for cabg vs further titration of meds and both were in agreement to continue medication titration at this time  3:45 PM CONSULT with Dr. Radford Pax who recommends hospitalist admission at [facility]. Recommends increasing Imdur to '90mg'$  and increased metoprolol to 75 mg BID. States that patient should be ambulated and if she continues to experience chest pain then cardiology will need to be reconsulted. If sxs improve then she can be d/c home with outpatient f/u.   4:08 pm CONSULT with Dr. Nehemiah Settle who accepts patient for admission   Final Clinical Impression(s) / ED Diagnoses Final diagnoses:  Chest pain, unspecified type    Rx / DC Orders ED Discharge Orders     None        Bishop Dublin 03/30/21 1610    Long, Wonda Olds, MD 03/31/21 [phone]

## 2021-03-30 NOTE — Progress Notes (Signed)
CBG 97.  Data has not transferred.

## 2021-03-31 DIAGNOSIS — R072 Precordial pain: Secondary | ICD-10-CM | POA: Diagnosis not present

## 2021-03-31 LAB — BASIC METABOLIC PANEL
Anion gap: 9 (ref 5–15)
BUN: 15 mg/dL (ref 8–23)
CO2: 24 mmol/L (ref 22–32)
Calcium: 9.1 mg/dL (ref 8.9–10.3)
Chloride: 102 mmol/L (ref 98–111)
Creatinine, Ser: 0.68 mg/dL (ref 0.44–1.00)
GFR, Estimated: >60 mL/min (ref >60)
Glucose, Bld: 100 mg/dL — ABNORMAL HIGH (ref 70–99)
Potassium: 3.9 mmol/L (ref 3.5–5.1)
Sodium: 135 mmol/L (ref 135–145)

## 2021-03-31 LAB — CBC
HCT: 40.5 % (ref 36.0–46.0)
Hemoglobin: 12.9 g/dL (ref 12.0–15.0)
MCH: 31 pg (ref 26.0–34.0)
MCHC: 31.9 g/dL (ref 30.0–36.0)
MCV: 97.4 fL (ref 80.0–100.0)
Platelets: 218 10*3/uL (ref 150–400)
RBC: 4.16 MIL/uL (ref 3.87–5.11)
RDW: 13.7 % (ref 11.5–15.5)
WBC: 6.9 10*3/uL (ref 4.0–10.5)
nRBC: 0 % (ref 0.0–0.2)

## 2021-03-31 LAB — TROPONIN I (HIGH SENSITIVITY): Troponin I (High Sensitivity): 18 ng/L — ABNORMAL HIGH (ref <18)

## 2021-03-31 LAB — GLUCOSE, CAPILLARY
Glucose-Capillary: 111 mg/dL — ABNORMAL HIGH (ref 70–99)
Glucose-Capillary: 105 mg/dL — ABNORMAL HIGH (ref 70–99)

## 2021-03-31 MED ORDER — ISOSORBIDE MONONITRATE ER 30 MG PO TB24: 90.0000 mg | ORAL_TABLET | Freq: Every day | ORAL | 3 refills | Status: DC

## 2021-03-31 MED ORDER — ASPIRIN EC 81 MG PO TBEC
81.0000 mg | DELAYED_RELEASE_TABLET | Freq: Every day | ORAL | Status: DC
  Administered 2021-03-31: 81 mg via ORAL
  Filled 2021-03-31: qty 1

## 2021-03-31 NOTE — Progress Notes (Signed)
Discharge instructions reviewed with patient, given AVS, prescription sent to [facility] per provider. Patient verbalized understanding of home medications, follow-up with PCP and cardiology as instructed. IV site removed, within normal limits. Patient left unit via w/c accompanied by nurse tech. Discharged home.

## 2021-03-31 NOTE — Plan of Care (Signed)

## 2021-03-31 NOTE — Progress Notes (Signed)
Pt ambulated in room from bed to door x2 using FWW and standby assist. Tolerated well, denies any SOB or pain, good weight bearing, slightly unsteady on rising from bed to standing but good gait with ambulation. MD [place] notified.

## 2021-03-31 NOTE — Discharge Summary (Signed)
Physician Discharge Summary  AMBI FEJES [id number] DOB: [birth year] DOA: 03/30/2021  PCP: Katrina Squibb, MD  Admit date: 03/30/2021 Discharge date: 03/31/2021  Admitted From: Home  Disposition:  Home   Recommendations for Outpatient Follow-up:  Follow up with PCP in 2 weeks [name]: If patient develops symptoms, please refer for Monoclonal Ab or [place]: None   Equipment/Devices: None new  Discharge Condition: Fair  CODE STATUS: FULL Diet recommendation: Cardiac  Brief/Interim Summary: [name] is a 85 y.o. F with pAF not on [facility], CAD hx DES 2010, SSS with PPM, dCHF, and HTN who presented for CP.  Patient recently admitted this month for hypertension, found to have incidental NSTEMI, underwent LHC that showed 3V disease, elected for medical therapy, went home.  Since discharge, she has been well. The morning of admission, her BP was low and she had some chest pain similar to her previous, so she came to the ER.  In the ER, troponins low flat.  CXR clear.  ECG unremarkable.         PRINCIPAL HOSPITAL DIAGNOSIS: Hypotension    Discharge Diagnoses:   Hypotension due to medication This was the primary complaint. Likely this is from new antianginals in combination with her antihypertensives  Amlodipine, Imdur, metoprolol continued.  Imdur increased.  Hydralazine and ARB stopped.  Close PCP follow up recommended.     Incidental COVID Asymptomatic.  Daughter picked this up at work.  Given she is asymptomatic, I do not believe Mab or molnupiravir benefit outweighs risk.    But if develops symptoms at her age with her vascular disease, would need one.  Daughter erroneously told by me to seek Paxlovid, which would be contraindicated with her amiodarone.     Angina Coronary artery disease Hypertension Continue aspirin, atorvastatin, Plavix, empagliflozin, amlodipine, Imdur, metoprolol Stop hydralazine and  Irbesartan   SSS Paroxysmal atrial fibrillation Chronic diastolic CHF HR controlled, BP controlled off meds, appeared euvolemic  GERD  Tremor, essential             Discharge Instructions  Discharge Instructions     Diet - low sodium heart healthy   Complete by: As directed    Discharge instructions   Complete by: As directed    From [name]: You were admitted for low blood pressure and chest pain The blood pressure improved with holding your medicines for the afternoon Probably, some of the medicines for your chest pain are also lowering your blood pressure, and it's just too much The solution would be to stop some of hte medicines Continue amlodipine and metoprolol (these both help with blood pressure/BP and also chest pain) Continue Imdur but increase the dose to 90 mg daily (that means going from one 60 mg tab to three 30 mg tabs, because it doesn't come in 90s and can't be split) STOP hydralazine and irbesartan  Continue your other heart medicines: aspirin, atorvastatin, Plavix, furosemide, and Jardiance   For the COVID Isolate for 5 days if you have no syptoms  If you develop symptoms, call your Doctor for Paxlovid right away   Increase activity slowly   Complete by: As directed       Allergies as of 03/31/2021       Reactions   Amitriptyline Hcl Other (See Comments)   Caused "jaws to twist and lock"   Sulfonamide Derivatives Other (See Comments)   UNKNOWN REACTION        Medication List  STOP taking these medications    hydrALAZINE 25 MG tablet Commonly known as: APRESOLINE   irbesartan 150 MG tablet Commonly known as: AVAPRO       TAKE these medications    amiodarone 200 MG tablet Commonly known as: PACERONE Take 200 mg Daily Monday - Saturday None on Sunday   amLODipine 10 MG tablet Commonly known as: NORVASC Take 1 tablet (10 mg total) by mouth daily.   aspirin EC 81 MG tablet Take 1 tablet (81 mg total) by mouth  daily. Swallow whole.   atorvastatin 80 MG tablet Commonly known as: LIPITOR Take 1 tablet (80 mg total) by mouth daily.   clopidogrel 75 MG tablet Commonly known as: PLAVIX Take 1 tablet (75 mg total) by mouth daily.   folic acid A999333 MCG tablet Commonly known as: FOLVITE Take 400 mcg by mouth daily.   furosemide 40 MG tablet Commonly known as: LASIX Take 1 tablet (40 mg total) by mouth daily as needed for fluid or edema.   isosorbide mononitrate 30 MG 24 hr tablet Commonly known as: IMDUR Take 3 tablets (90 mg total) by mouth daily. What changed:  medication strength how much to take   Jardiance 10 MG Tabs tablet Generic drug: empagliflozin Take 10 mg by mouth daily.   metoprolol succinate 25 MG 24 hr tablet Commonly known as: TOPROL-XL Take 3 tablets (75 mg total) by mouth in the morning. & 50 mg in the evening   multivitamin with minerals Tabs tablet Take 1 tablet by mouth daily.   nitroGLYCERIN 0.4 MG SL tablet Commonly known as: NITROSTAT Place 1 tablet (0.4 mg total) under the tongue every 5 (five) minutes x 3 doses as needed for chest pain.   ondansetron 4 MG tablet Commonly known as: ZOFRAN Take 4 mg by mouth every 6 (six) hours as needed for nausea or vomiting.   pantoprazole 40 MG tablet Commonly known as: PROTONIX Take 1 tablet (40 mg total) by mouth daily.   potassium chloride SA 20 MEQ tablet Commonly known as: KLOR-CON Take 20 mEq by mouth See admin instructions. Takes 1 tablet (20 meq) by mouth daily (scheduled) & a 2nd tablet only if has to take a 2nd dose of Lasix   primidone 50 MG tablet Commonly known as: MYSOLINE Take 50 mg by mouth at bedtime.   ProAir HFA 108 (90 Base) MCG/ACT inhaler Generic drug: albuterol Inhale 1 puff into the lungs every 4 (four) hours as needed for wheezing or shortness of breath.   senna-docusate 8.6-50 MG tablet Commonly known as: Senokot-S Take 1 tablet by mouth at bedtime.   Vitamin B-12 5000 MCG  Subl Take 5,000 mcg by mouth daily.   VITAMIN D (CHOLECALCIFEROL) PO Take 1 tablet by mouth daily.        Follow-up Information     Katrina Squibb, MD. Schedule an appointment as soon as possible for a visit in 2 week(s).   Specialty: Internal Medicine Contact information: [facility] [address] [phone]         Satira Sark, MD .   Specialty: Cardiology Contact information: [place] [address] [phone]                Allergies  Allergen Reactions   Amitriptyline Hcl Other (See Comments)    Caused "jaws to twist and lock"   Sulfonamide Derivatives Other (See Comments)    UNKNOWN REACTION       Procedures/Studies: DG  Chest 1 View  Result Date: 03/30/2021 CLINICAL DATA:  Shortness of breath.  Recent MI. EXAM: CHEST  1 VIEW COMPARISON:  March 19, 2021 FINDINGS: The 2 lead pacemaker is in stable position. No pneumothorax. Stable cardiomegaly. The hila and mediastinum are unchanged. No pneumothorax. No nodules or masses. No focal infiltrates. No overt edema. IMPRESSION: No active disease. Electronically Signed   By: Dorise Bullion III M.D.   On: 03/30/2021 14:34   DG Chest 2 View  Result Date: 03/19/2021 CLINICAL DATA:  Shortness of breath EXAM: CHEST - 2 VIEW COMPARISON:  05/25/2017 FINDINGS: Increased interstitial markings. No focal consolidation or frank interstitial edema. No pleural effusion or pneumothorax. Cardiomegaly.  Left subclavian pacemaker. Visualized osseous structures are within normal limits. IMPRESSION: Cardiomegaly.  No frank interstitial edema. Electronically Signed   By: Julian Hy M.D.   On: 03/19/2021 21:47   CARDIAC CATHETERIZATION  Result Date: 03/21/2021   Severe calcified diffuse three-vessel coronary artery disease.  Right dominant anatomy.   Nonobstructive left main   Ostial 75% calcified LAD with tandem mid 80% and distal 80% stenoses.   Diffusely diseased circumflex with 75%  proximal to mid first obtuse marginal.  Mid to distal circumflex 70% before small second obtuse marginal.   Dominant right coronary with previously placed proximal stent having diffuse 50% ISR, 80 to 90% segmental mid stenosis (probable culprit), and continuation of segmental 60% stenosis to the distal segment where the artery opens up to a 3.0 diameter.  PDA and LV branch are large.   Normal LVEDP.  EF 65%. RECOMMENDATIONS: Medical therapy versus CABG versus elevated risk RCA PCI.  Consider heart team approach after conversation with the patient and family about level of aggressiveness that they feel is appropriate. Aggressive risk factor modification and anti-ischemic therapy. If RCA PCI, would recommend femoral approach rather than radial for better catheter support during the intervention.   ECHOCARDIOGRAM COMPLETE  Result Date: 03/20/2021    ECHOCARDIOGRAM REPORT   Patient Name:   [name] Date of Exam: 03/20/2021 Medical Rec #:  [id number]    Height:       65.5 in Accession #:    [id number]   Weight:       141.4 lb Date of Birth:  05/30/1936    BSA:          1.717 m Patient Age:    85 years     BP:           198/90 mmHg Patient Gender: F            HR:           60 bpm. Exam Location:  Inpatient Procedure: 2D Echo, 3D Echo, Cardiac Doppler and Color Doppler Indications:    R07.9* Chest pain, unspecified  History:        Patient has prior history of Echocardiogram examinations, most                 recent 06/01/2017. CAD, Abnormal ECG, Arrythmias:Atrial                 Fibrillation and Bradycardia, Signs/Symptoms:Chest Pain; Risk                 Factors:Hypertension and Dyslipidemia.  Sonographer:    Roseanna Rainbow RDCS Referring Phys: [id number] [facility]  Sonographer Comments: Technically difficult study due to poor echo windows. Patient in high fowler's position. IMPRESSIONS  1. Left ventricular ejection fraction, by estimation, is 55 to 60%. The  left ventricle has normal function. The left ventricle has no  regional wall motion abnormalities. Left ventricular diastolic parameters are consistent with Grade II diastolic dysfunction (pseudonormalization). Elevated left ventricular end-diastolic pressure.  2. Right ventricular systolic function is normal. The right ventricular size is normal. There is normal pulmonary artery systolic pressure.  3. Left atrial size was moderately dilated.  4. The mitral valve is degenerative. Mild mitral valve regurgitation. No evidence of mitral stenosis.  5. There is partial fusion of the left and non-coronary cusps. The aortic valve is calcified. There is mild calcification of the aortic valve. There is mild thickening of the aortic valve. Aortic valve regurgitation is mild. Mild aortic valve sclerosis is present, with no evidence of aortic valve stenosis. Aortic regurgitation PHT measures 582 msec. Aortic valve area, by VTI measures 2.49 cm. Aortic valve mean gradient measures 3.5 mmHg. Aortic valve Vmax measures 1.23 m/s.  6. Aortic dilatation noted. There is mild dilatation of the aortic root, measuring 37 mm. There is mild dilatation of the ascending aorta, measuring 38 mm.  7. The inferior vena cava is normal in size with greater than 50% respiratory variability, suggesting right atrial pressure of 3 mmHg. FINDINGS  Left Ventricle: Left ventricular ejection fraction, by estimation, is 55 to 60%. The left ventricle has normal function. The left ventricle has no regional wall motion abnormalities. The left ventricular internal cavity size was normal in size. There is  no left ventricular hypertrophy. Left ventricular diastolic parameters are consistent with Grade II diastolic dysfunction (pseudonormalization). Elevated left ventricular end-diastolic pressure. Right Ventricle: The right ventricular size is normal. No increase in right ventricular wall thickness. Right ventricular systolic function is normal. There is normal pulmonary artery systolic pressure. The tricuspid regurgitant  velocity is 2.64 m/s, and  with an assumed right atrial pressure of 3 mmHg, the estimated right ventricular systolic pressure is 123456 mmHg. Left Atrium: Left atrial size was moderately dilated. Right Atrium: Right atrial size was normal in size. Pericardium: There is no evidence of pericardial effusion. Mitral Valve: The mitral valve is degenerative in appearance. There is moderate thickening of the mitral valve leaflet(s). There is moderate calcification of the mitral valve leaflet(s). Mild to moderate mitral annular calcification. Mild mitral valve regurgitation. No evidence of mitral valve stenosis. The mean mitral valve gradient is 1.3 mmHg. Tricuspid Valve: The tricuspid valve is normal in structure. Tricuspid valve regurgitation is mild . No evidence of tricuspid stenosis. Aortic Valve: There is partial fusion of the left and non-coronary cusps. The aortic valve is calcified. There is mild calcification of the aortic valve. There is mild thickening of the aortic valve. Aortic valve regurgitation is mild. Aortic regurgitation PHT measures 582 msec. Mild aortic valve sclerosis is present, with no evidence of aortic valve stenosis. Aortic valve mean gradient measures 3.5 mmHg. Aortic valve peak gradient measures 6.1 mmHg. Aortic valve area, by VTI measures 2.49 cm. Pulmonic Valve: The pulmonic valve was normal in structure. Pulmonic valve regurgitation is trivial. No evidence of pulmonic stenosis. Aorta: Aortic dilatation noted. There is mild dilatation of the aortic root, measuring 37 mm. There is mild dilatation of the ascending aorta, measuring 38 mm. Venous: The inferior vena cava is normal in size with greater than 50% respiratory variability, suggesting right atrial pressure of 3 mmHg. IAS/Shunts: No atrial level shunt detected by color flow Doppler. Additional Comments: A device lead is visualized.  LEFT VENTRICLE PLAX 2D LVIDd:         3.80 cm  Diastology LVIDs:         2.70 cm      LV e' medial:     3.68 cm/s LV PW:         0.95 cm      LV E/e' medial:  22.6 LV IVS:        1.40 cm      LV e' lateral:   5.48 cm/s LVOT diam:     1.80 cm      LV E/e' lateral: 15.2 LV SV:         68 LV SV Index:   40 LVOT Area:     2.54 cm                              3D Volume EF: LV Volumes (MOD)            3D EF:        51 % LV vol d, MOD A2C: 115.0 ml LV EDV:       94 ml LV vol d, MOD A4C: 55.6 ml  LV ESV:       46 ml LV vol s, MOD A2C: 53.5 ml  LV SV:        48 ml LV vol s, MOD A4C: 22.5 ml LV SV MOD A2C:     61.5 ml LV SV MOD A4C:     55.6 ml LV SV MOD BP:      49.0 ml RIGHT VENTRICLE            IVC RV S prime:     9.38 cm/s  IVC diam: 1.80 cm TAPSE (M-mode): 2.1 cm LEFT ATRIUM             Index       RIGHT ATRIUM           Index LA diam:        3.30 cm 1.92 cm/m  RA Area:     17.80 cm LA Vol (A2C):   55.2 ml 32.15 ml/m RA Volume:   47.10 ml  27.43 ml/m LA Vol (A4C):   56.6 ml 32.97 ml/m LA Biplane Vol: 59.4 ml 34.60 ml/m  AORTIC VALVE AV Area (Vmax):    2.79 cm AV Area (Vmean):   2.39 cm AV Area (VTI):     2.49 cm AV Vmax:           123.50 cm/s AV Vmean:          82.400 cm/s AV VTI:            0.274 m AV Peak Grad:      6.1 mmHg AV Mean Grad:      3.5 mmHg LVOT Vmax:         135.50 cm/s LVOT Vmean:        77.550 cm/s LVOT VTI:          0.268 m LVOT/AV VTI ratio: 0.98 AI PHT:            582 msec  AORTA Ao Root diam: 3.70 cm Ao Asc diam:  3.80 cm MITRAL VALVE               TRICUSPID VALVE MV Area (PHT): 3.08 cm    TR Peak grad:   27.9 mmHg MV Mean grad:  1.3 mmHg    TR Vmax:        264.00 cm/s MV Decel Time: 246 msec MV  E velocity: 83.10 cm/s  SHUNTS MV A velocity: 60.40 cm/s  Systemic VTI:  0.27 m MV E/A ratio:  1.38        Systemic Diam: 1.80 cm Skeet Latch MD Electronically signed by Skeet Latch MD Signature Date/Time: 03/20/2021/5:08:48 PM    Final    CT Angio Chest/Abd/Pel for Dissection W and/or W/WO  Result Date: 03/20/2021 CLINICAL DATA:  Hypertension, back pain EXAM: CT ANGIOGRAPHY CHEST,  ABDOMEN AND PELVIS TECHNIQUE: Non-contrast CT of the chest was initially obtained. Multidetector CT imaging through the chest, abdomen and pelvis was performed using the standard protocol during bolus administration of intravenous contrast. Multiplanar reconstructed images and MIPs were obtained and reviewed to evaluate the vascular anatomy. CONTRAST:  189m OMNIPAQUE IOHEXOL 350 MG/ML SOLN COMPARISON:  CTA chest, CT abdomen/pelvis dated 05/25/2017 FINDINGS: CTA CHEST FINDINGS Cardiovascular: On unenhanced CT, there is no evidence of intramural hematoma. Preferential opacification of the thoracic aorta. No evidence of thoracic aortic aneurysm or dissection. Atherosclerotic calcifications of the aortic arch. Although not tailored for evaluation of the pulmonary arteries, there is no evidence of central pulmonary embolism. The heart is top-normal in size. No pericardial effusion. Left subclavian pacemaker. Three vessel coronary atherosclerosis. Mediastinum/Nodes: No suspicious mediastinal lymphadenopathy. Visualized thyroid is unremarkable. Lungs/Pleura: Biapical pleural-parenchymal scarring. Mild centrilobular and paraseptal emphysematous changes, upper lung predominant. No suspicious pulmonary nodules. No focal consolidation. No pleural effusion or pneumothorax. Musculoskeletal: Mild degenerative changes of the mid/lower thoracic spine. Review of the MIP images confirms the above findings. CTA ABDOMEN AND PELVIS FINDINGS VASCULAR Aorta: No evidence abdominal aortic aneurysm or dissection. Atherosclerotic calcifications. Patent. Celiac: Atherosclerotic calcifications at the origin.  Patent. SMA: Atherosclerotic calcifications at the origin.  Patent. Renals: Patent bilaterally.  Atherosclerotic calcifications. IMA: Patent. Inflow: Patent bilaterally.  Atherosclerotic calcifications. Veins: Unremarkable. Review of the MIP images confirms the above findings. NON-VASCULAR Hepatobiliary: Liver is within normal limits.  Status post cholecystectomy. No intrahepatic or extrahepatic ductal dilatation. Pancreas: 11 mm low-density lesion in the pancreatic head (series 5/image 97), new. Mild prominence of the main pancreatic duct, measuring 4-5 mm (series 5/image 91). Spleen: Coarse splenic granuloma. Adrenals/Urinary Tract: Adrenal glands are within normal limits. Scarring in the posterior left lower kidney. Right kidney is within normal limits. No hydronephrosis. Bladder is within normal limits. Stomach/Bowel: Stomach is within normal limits. No evidence of bowel obstruction. Appendix is not discretely visualized. Lymphatic: No suspicious abdominopelvic lymphadenopathy. Reproductive: Status post hysterectomy. Bilateral ovaries are within normal limits. Other: No abdominopelvic ascites. Musculoskeletal: Mild degenerative changes of the lumbar spine. Review of the MIP images confirms the above findings. IMPRESSION: No evidence of thoracoabdominal aortic aneurysm or dissection. 11 mm low-density lesion in the pancreatic head, new. This is poorly evaluated on CT. Follow-up MRI abdomen with/without contrast is suggested in 4 weeks (on an outpatient basis). Electronically Signed   By: SJulian HyM.D.   On: 03/20/2021 00:38      Subjective: Feeling well.  No chest pain with ambulation.  BP normal, no dizziness.  No cough, dyspnea, rhinorrhea, malaise.  Discharge Exam: Vitals:   03/31/21 0506 03/31/21 1023  BP: 125/72 129/73  Pulse: 63 69  Resp: 20 16  Temp: 98.9 F (37.2 C) 98.9 F (37.2 C)  SpO2: 96% 98%   Vitals:   03/31/21 0127 03/31/21 0501 03/31/21 0506 03/31/21 1023  BP: 130/70  125/72 129/73  Pulse: 66  63 69  Resp: '20  20 16  '$ Temp: 98.2 F (36.8 C)  98.9 F (37.2 C) 98.9  F (37.2 C)  TempSrc: Oral  Oral Oral  SpO2: 98%  96% 98%  Weight:  59.6 kg    Height:  '5\' 5"'$  (1.651 m)      General: Pt is alert, awake, not in acute distress Cardiovascular: RRR, nl S1-S2, no murmurs appreciated.   No LE  edema.   Respiratory: Normal respiratory rate and rhythm.  CTAB without rales or wheezes. Abdominal: Abdomen soft and non-tender.  No distension or HSM.   Neuro/Psych: Strength symmetric in upper and lower extremities.  Judgment and insight appear normal.   The results of significant diagnostics from this hospitalization (including imaging, microbiology, ancillary and laboratory) are listed below for reference.     Microbiology: Recent Results (from the past 240 hour(s))  Resp Panel by RT-PCR (Flu A&B, Covid) Nasopharyngeal Swab     Status: Abnormal   Collection Time: 03/30/21  4:45 PM   Specimen: Nasopharyngeal Swab; Nasopharyngeal(NP) swabs in vial transport medium  Result Value Ref Range Status   SARS Coronavirus 2 by RT PCR POSITIVE (A) NEGATIVE Final    Comment: CRITICAL RESULT CALLED TO, READ BACK BY AND VERIFIED WITH: MILEY,L 1817 03/30/2021 COLEMAN,R (NOTE) SARS-CoV-2 target nucleic acids are DETECTED.  The SARS-CoV-2 RNA is generally detectable in upper respiratory specimens during the acute phase of infection. Positive results are indicative of the presence of the identified virus, but do not rule out bacterial infection or co-infection with other pathogens not detected by the test. Clinical correlation with patient history and other diagnostic information is necessary to determine patient infection status. The expected result is Negative.  Fact Sheet for Patients: EntrepreneurPulse.com.au  Fact Sheet for Healthcare Providers: IncredibleEmployment.be  This test is not yet approved or cleared by the [place] FDA and  has been authorized for detection and/or diagnosis of SARS-CoV-2 by FDA under an Emergency Use Authorization (EUA).  This EUA will remain in effect (meaning this te st can be used) for the duration of  the COVID-19 declaration under Section 564(b)(1) of the Act, 21 U.S.C. section 360bbb-3(b)(1), unless the  authorization is terminated or revoked sooner.     Influenza A by PCR NEGATIVE NEGATIVE Final   Influenza B by PCR NEGATIVE NEGATIVE Final    Comment: (NOTE) The Xpert Xpress SARS-CoV-2/FLU/RSV plus assay is intended as an aid in the diagnosis of influenza from Nasopharyngeal swab specimens and should not be used as a sole basis for treatment. Nasal washings and aspirates are unacceptable for Xpert Xpress SARS-CoV-2/FLU/RSV testing.  Fact Sheet for Patients: EntrepreneurPulse.com.au  Fact Sheet for Healthcare Providers: IncredibleEmployment.be  This test is not yet approved or cleared by the [place] FDA and has been authorized for detection and/or diagnosis of SARS-CoV-2 by FDA under an Emergency Use Authorization (EUA). This EUA will remain in effect (meaning this test can be used) for the duration of the COVID-19 declaration under Section 564(b)(1) of the Act, 21 U.S.C. section 360bbb-3(b)(1), unless the authorization is terminated or revoked.  Performed at [facility], [address], [place], [place] [address]      Labs: BNP (last 3 results) Recent Labs    03/19/21 2320 03/30/21 1354  BNP 295.0* 123456*   Basic Metabolic Panel: Recent Labs  Lab 03/30/21 1354 03/31/21 0708  NA 134* 135  K 3.8 3.9  CL 100 102  CO2 28 24  GLUCOSE 97 100*  BUN 14 15  CREATININE 0.72 0.68  CALCIUM 9.0 9.1   Liver Function Tests: No results for input(s): AST, ALT, ALKPHOS,  BILITOT, PROT, ALBUMIN in the last 168 hours. No results for input(s): LIPASE, AMYLASE in the last 168 hours. No results for input(s): AMMONIA in the last 168 hours. CBC: Recent Labs  Lab 03/30/21 1354 03/31/21 0708  WBC 6.1 6.9  HGB 14.0 12.9  HCT 44.2 40.5  MCV 96.9 97.4  PLT 253 218   Cardiac Enzymes: No results for input(s): CKTOTAL, CKMB, CKMBINDEX, TROPONINI in the last 168 hours. BNP: Invalid input(s): POCBNP CBG: Recent Labs  Lab 03/31/21 0738  03/31/21 1113  GLUCAP 111* 105*   D-Dimer No results for input(s): DDIMER in the last 72 hours. Hgb A1c No results for input(s): HGBA1C in the last 72 hours. Lipid Profile No results for input(s): CHOL, HDL, LDLCALC, TRIG, CHOLHDL, LDLDIRECT in the last 72 hours. Thyroid function studies No results for input(s): TSH, T4TOTAL, T3FREE, THYROIDAB in the last 72 hours.  Invalid input(s): FREET3 Anemia work up No results for input(s): VITAMINB12, FOLATE, FERRITIN, TIBC, IRON, RETICCTPCT in the last 72 hours. Urinalysis    Component Value Date/Time   COLORURINE YELLOW 12/07/2009 1532   APPEARANCEUR CLEAR 12/07/2009 1532   LABSPEC 1.010 12/07/2009 1532   PHURINE 6.5 12/07/2009 1532   GLUCOSEU NEGATIVE 12/07/2009 1532   HGBUR NEGATIVE 12/07/2009 1532   BILIRUBINUR NEGATIVE 12/07/2009 1532   KETONESUR NEGATIVE 12/07/2009 1532   PROTEINUR NEGATIVE 12/07/2009 1532   UROBILINOGEN 0.2 12/07/2009 1532   NITRITE NEGATIVE 12/07/2009 1532   LEUKOCYTESUR  12/07/2009 1532    NEGATIVE MICROSCOPIC NOT DONE ON URINES WITH NEGATIVE PROTEIN, BLOOD, LEUKOCYTES, NITRITE, OR GLUCOSE <1000 mg/dL.   Sepsis Labs Invalid input(s): PROCALCITONIN,  WBC,  LACTICIDVEN Microbiology Recent Results (from the past 240 hour(s))  Resp Panel by RT-PCR (Flu A&B, Covid) Nasopharyngeal Swab     Status: Abnormal   Collection Time: 03/30/21  4:45 PM   Specimen: Nasopharyngeal Swab; Nasopharyngeal(NP) swabs in vial transport medium  Result Value Ref Range Status   SARS Coronavirus 2 by RT PCR POSITIVE (A) NEGATIVE Final    Comment: CRITICAL RESULT CALLED TO, READ BACK BY AND VERIFIED WITH: MILEY,L [id number] 03/30/2021 COLEMAN,R (NOTE) SARS-CoV-2 target nucleic acids are DETECTED.  The SARS-CoV-2 RNA is generally detectable in upper respiratory specimens during the acute phase of infection. Positive results are indicative of the presence of the identified virus, but do not rule out bacterial infection or co-infection  with other pathogens not detected by the test. Clinical correlation with patient history and other diagnostic information is necessary to determine patient infection status. The expected result is Negative.  Fact Sheet for Patients: EntrepreneurPulse.com.au  Fact Sheet for Healthcare Providers: IncredibleEmployment.be  This test is not yet approved or cleared by the [place] FDA and  has been authorized for detection and/or diagnosis of SARS-CoV-2 by FDA under an Emergency Use Authorization (EUA).  This EUA will remain in effect (meaning this te st can be used) for the duration of  the COVID-19 declaration under Section 564(b)(1) of the Act, 21 U.S.C. section 360bbb-3(b)(1), unless the authorization is terminated or revoked sooner.     Influenza A by PCR NEGATIVE NEGATIVE Final   Influenza B by PCR NEGATIVE NEGATIVE Final    Comment: (NOTE) The Xpert Xpress SARS-CoV-2/FLU/RSV plus assay is intended as an aid in the diagnosis of influenza from Nasopharyngeal swab specimens and should not be used as a sole basis for treatment. Nasal washings and aspirates are unacceptable for Xpert Xpress SARS-CoV-2/FLU/RSV testing.  Fact Sheet for Patients: EntrepreneurPulse.com.au  Fact Sheet for Healthcare Providers:  IncredibleEmployment.be  This test is not yet approved or cleared by the [place] and has been authorized for detection and/or diagnosis of SARS-CoV-2 by FDA under an Emergency Use Authorization (EUA). This EUA will remain in effect (meaning this test can be used) for the duration of the COVID-19 declaration under Section 564(b)(1) of the Act, 21 U.S.C. section 360bbb-3(b)(1), unless the authorization is terminated or revoked.  Performed at [facility], [address] [place], [place] [address]      Time coordinating discharge: 35 minutes The  controlled substances registry was  reviewed for this patient     30 Day Unplanned Readmission Risk Score    Flowsheet Row ED to Hosp-Admission (Discharged) from 03/19/2021 in [place]  30 Day Unplanned Readmission Risk Score (%) 11.35 Filed at 03/22/2021 1200       This score is the patient's risk of an unplanned readmission within 30 days of being discharged (0 -100%). The score is based on dignosis, age, lab data, medications, orders, and past utilization.   Low:  0-14.9   Medium: 15-21.9   High: 22-29.9   Extreme: 30 and above            SIGNED:   Edwin Dada, MD  [facility] 03/31/2021, 5:43 PM

## 2021-04-01 ENCOUNTER — Other Ambulatory Visit: Payer: Self-pay | Admitting: *Deleted

## 2021-04-01 LAB — GLUCOSE, CAPILLARY: Glucose-Capillary: 97 mg/dL (ref 70–99)

## 2021-04-01 NOTE — Patient Outreach (Signed)
[facility]) Care Management  04/01/2021  KAMINA KORTH [birth year] [id number]   Telephone Assessment Primary to completed the transition of care Pt admitted on 8/27-8/28 for chest pain and hypotension  RN spoke with the pt who permitted RN to speak with April (daughter Guadelupe Sabin). April indicated pt doing well however both have covid. States pt's provider it aware and pt will come out of quarantine in 5 days for an office visit scheduled on 9/6.   Verified pt has all her medications and transportation to all her upcoming medical appointments. No additional needs as pt continue to be safe. Will follow the ongoing plan of care as discussed and RN continue to reiterated on this plan for ongoing recovery.  Will follow up in a few weeks for pt's ongoing management of care.      Goals Addressed             This Visit's Progress    [facility]) Patient will verbalize continuation of monitoring her B/P daily for the next 90 days   On track    Timeframe:  Long-Range Goal Priority:  High Start Date: 05/07/20                            Expected End Date: 05/07/21                      Follow Up Date 06/02/21   - check blood pressure daily - write blood pressure results in a log or diary  Encouraged patient to limit her salt intake. -Discussed for patient to review color zones and rescue action plans located in her [facility] calendar booklet   Why is this important?   You won't feel high blood pressure, but it can still hurt your blood vessels.  High blood pressure can cause heart or kidney problems. It can also cause a stroke.  Making lifestyle changes like losing a little weight or eating less salt will help.  Checking your blood pressure at home and at different times of the day can help to control blood pressure.  If the doctor prescribes medicine remember to take it the way the doctor ordered.  Call the office if you cannot afford the medicine or if there are questions about it.      Notes: Patient received the scale, B/P cuff, Hypertension and CHF education. Patient states she is taking her weight and B/P daily but is unable to write down her daily values due to severe tremors. Nurse reviewed signs and symptoms action plans and zones. Updated: 07/18/20  Updated 10/02/20: Patient reports continuation of taking her weight, pulse and B/P daily. She reports that her SOB has improved and her B/P and weight remain stable. Nurse reviewed signs and symptoms, action plans, and zones.  Updated 12/07/20: Patient continues to take her B/P and pulse daily. She states her B/P values have improved with her systolic values typically not getting higher than 150 unless she gets upset or nervous.   Updated 02/11/21: Patient states that she continues to take her B/P daily. She reports that her values have improved and typically are in the 140's/70's range. Patient explains that she is feeling better now that her B/P is in better control.      [facility]--Make and Keep All Appointments   On track    Timeframe:  Short-Term Goal Priority:  Medium Start Date:    03/25/2021  Expected End Date:  05/03/2021  Follow Up Date: 04/25/2021  Barriers: Health Behaviors Knowledge                        - ask family or friend for a ride - call to cancel if needed - keep a calendar with appointment dates    Why is this important?   Part of staying healthy is seeing the doctor for follow-up care.  If you forget your appointments, there are some things you can do to stay on track.    Notes:  8/22- Discussed with daughter Bonita Quin all pending appointments post d/c orders and verified pt has sufficient transportation to all her pending appointments. Pt's niece April is also a caregiver and assist the pt with her pill box refills and transporting pt to all her medical appointments. In addition to running needed errands.      [facility]--Matintain My Quality of Life   On track    Timeframe:   Long-Range Goal Priority:  Medium Start Date:  03/25/2021                           Expected End Date:    07/03/2021                   Follow Up Date 04/25/2021    - complete a living will - do one enjoyable thing every day - make shared treatment decisions with doctor - name a health care proxy (decision maker) - spend time outdoors at least 3 times a week - strengthen or fix relationships with loved ones  Barriers: Health Behaviors Knowledge    Why is this important?   Having a long-term illness can be scary.  It can also be stressful for you and your caregiver.  These steps may help.    Notes:  8/22-Discussed healthy habits to improve quality of life and all goals with interventions noted above. Stress the importance of seek medical attention when needed with any acute symptoms of encounters.         Elliot Cousin, RN Care Management Coordinator [facility] Main Office [phone]

## 2021-04-02 DIAGNOSIS — I2510 Atherosclerotic heart disease of native coronary artery without angina pectoris: Secondary | ICD-10-CM | POA: Diagnosis not present

## 2021-04-02 DIAGNOSIS — I480 Paroxysmal atrial fibrillation: Secondary | ICD-10-CM | POA: Diagnosis not present

## 2021-04-02 DIAGNOSIS — K869 Disease of pancreas, unspecified: Secondary | ICD-10-CM | POA: Diagnosis not present

## 2021-04-02 DIAGNOSIS — R627 Adult failure to thrive: Secondary | ICD-10-CM | POA: Diagnosis not present

## 2021-04-02 DIAGNOSIS — I169 Hypertensive crisis, unspecified: Secondary | ICD-10-CM | POA: Diagnosis not present

## 2021-04-02 DIAGNOSIS — I160 Hypertensive urgency: Secondary | ICD-10-CM | POA: Diagnosis not present

## 2021-04-02 DIAGNOSIS — I214 Non-ST elevation (NSTEMI) myocardial infarction: Secondary | ICD-10-CM | POA: Diagnosis not present

## 2021-04-02 DIAGNOSIS — I5032 Chronic diastolic (congestive) heart failure: Secondary | ICD-10-CM | POA: Diagnosis not present

## 2021-04-02 DIAGNOSIS — R778 Other specified abnormalities of plasma proteins: Secondary | ICD-10-CM | POA: Diagnosis not present

## 2021-04-02 DIAGNOSIS — R9431 Abnormal electrocardiogram [ECG] [EKG]: Secondary | ICD-10-CM | POA: Diagnosis not present

## 2021-04-03 DIAGNOSIS — I10 Essential (primary) hypertension: Secondary | ICD-10-CM | POA: Diagnosis not present

## 2021-04-03 DIAGNOSIS — K219 Gastro-esophageal reflux disease without esophagitis: Secondary | ICD-10-CM | POA: Diagnosis not present

## 2021-04-10 ENCOUNTER — Other Ambulatory Visit: Payer: Self-pay | Admitting: *Deleted

## 2021-04-10 NOTE — Patient Outreach (Signed)
[facility]) Care Management  04/10/2021  PAT REINERTSEN 1936/07/31 [id number]   Telephone follow-up from Nurse hotline  RN spoke with pt on the request to call pt who usually has a conversation with the previous Health Coach(Jill). RN explained the pt why she now has this care manager for her ongoing needs. Pt verbalized an understanding. RN inquired on any immediate issues or needs at this time. Pt denied and indicated she just want to talk to Sharee Pimple but understood she now has a new case manager due to he recent hospitalization.  Pt aware she can call this RN case manager with any needs related to managing her medical education. Informed pt of the next outreach follow up  call over the next month with date and time provided.   No needs or issues to address at this time. Will follow up accordingly.    [name], RN Care Management Coordinator [place] Office [phone]

## 2021-04-11 NOTE — Progress Notes (Signed)
Cardiology Office Note   Date:  04/12/2021   ID:  [name], DOB Mar 03, 1936, MRN [id number]  PCP:  Celene Squibb, MD  Cardiologist:  Dr. Domenic Polite    Chief Complaint  Patient presents with   Coronary Artery Disease   Hospitalization Follow-up      History of Present Illness: [name] is a 85 y.o. female who presents for post hospital   Hospitalized in August for NSTEMI cardiac cath was done and recommendations of Medical therapy versus CABG versus elevated risk RCA PCI.  Consider heart team approach after conversation with the patient and family about level of aggressiveness that they feel is appropriate. The next day Pt and her daughter decided on medical therapy.  Plavix added and continue for 1 year her BP elevated on admit amlodipine added and bidil changed to long acting imdur. And hydralazine.  On BB and ARB.  Has a fib but declined anticoagulation.  Tachy brady with PPM. On amiodarone.   Readmitted 8/27 for chest pain.  Also hypotension.  She was covid +. Her hydralazine and ARB were stopped.   Her imdur was increased to 90 mg.  She had been having chest pain.    Today still with angina, at least once a week and sometimes more.  She has DOE as well.  No SOB at night.  She does take NTG at times if severe pain.  Pain usually lasts 15 to 20 min.   Usually she rests and it resolves.  Her BP home readings with current meds are down to [id number] systolic at times.    She was awakened by her remote monitor at home and family sent in a reading.  Will contact the device team to see what was transmitted.   Her diet is stable.  She lives alone and family leaves work to come to check on her.  Pt is very hard of hearing.  She is asking when she will receive stent.  Her family does not believe she is ready, and recently had covid.   That may be adding to her SOB.      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   CATARACT EXTRACTION     CHOLECYSTECTOMY     COLONOSCOPY  2011   COLONOSCOPY N/A 10/19/2014   Procedure: COLONOSCOPY;  Surgeon: Rogene Houston, MD;  [facility];  Service: Endoscopy;  Laterality: N/A;  1030   ESOPHAGEAL DILATION N/A 05/02/2020   Procedure: ESOPHAGEAL DILATION;  Surgeon: Rogene Houston, MD;  [facility];  Service: Gastroenterology;  Laterality: N/A;   ESOPHAGOGASTRODUODENOSCOPY N/A 10/26/2014   Procedure: ESOPHAGOGASTRODUODENOSCOPY (EGD);  Surgeon: Rogene Houston, MD;  [facility];  Service: Endoscopy;  Laterality: N/A;  45 - Dr. has lunch and learn   ESOPHAGOGASTRODUODENOSCOPY (EGD) WITH PROPOFOL N/A 05/02/2020   Procedure: ESOPHAGOGASTRODUODENOSCOPY (EGD) WITH PROPOFOL;  Surgeon: Rogene Houston, MD;  [facility];  Service: Gastroenterology;  Laterality: N/A;  250   Esophagogastroduodenoscopy with esophageal dilation  2004, 2006, 2007   GIVENS CAPSULE STUDY N/A 10/31/2014   Procedure: GIVENS CAPSULE STUDY;  Surgeon: Rogene Houston, MD;  [facility];  Service: Endoscopy;  Laterality: N/A;  730 -- pacemaker--needs monitoring--outpatient bed   INSERT / REPLACE / REMOVE PACEMAKER  2010   LEFT HEART CATH AND CORONARY ANGIOGRAPHY N/A 03/21/2021    Procedure: LEFT HEART CATH AND CORONARY ANGIOGRAPHY;  Surgeon: Belva Crome, MD;  Location: [facility];  Service: Cardiovascular;  Laterality: N/A;   LEFT HEART CATHETERIZATION WITH CORONARY ANGIOGRAM N/A 11/17/2011   Procedure: LEFT HEART CATHETERIZATION WITH CORONARY ANGIOGRAM;  Surgeon: Sherren Mocha, MD;  Location: [facility] CATH LAB;  Service: Cardiovascular;  Laterality: N/A;   LEFT HEART CATHETERIZATION WITH CORONARY ANGIOGRAM N/A 09/26/2014   Procedure: LEFT HEART CATHETERIZATION WITH CORONARY ANGIOGRAM;  Surgeon: Leonie Man, MD;  Location: [facility] CATH LAB;  Service: Cardiovascular;  Laterality: N/A;   Right rotator cuff repair     SHOULDER ACROMIOPLASTY Right 05/30/2015   Procedure: RIGHT SHOULDER ACROMIOPLASTY;  Surgeon: Carole Civil, MD;  Location: [facility];  Service: Orthopedics;  Laterality: Right;   SHOULDER OPEN ROTATOR CUFF REPAIR Right 05/30/2015   Procedure: OPEN ROTATOR CUFF REPAIR RIGHT SHOULDER;  Surgeon: Carole Civil, MD;  Location: [facility];  Service: Orthopedics;  Laterality: Right;   SHOULDER OPEN ROTATOR CUFF REPAIR Left 10/22/2016   Procedure: ROTATOR CUFF REPAIR SHOULDER OPEN;  Surgeon: Carole Civil, MD;  Location: [facility];  Service: Orthopedics;  Laterality: Left;   Subxiphoid pericardial window  11/10   VAGINAL HYSTERECTOMY       Current Outpatient Medications  Medication Sig Dispense Refill   amiodarone (PACERONE) 200 MG tablet Take 200 mg Daily Monday - Saturday None on Sunday 90 tablet 3   amLODipine (NORVASC) 10 MG tablet Take 1 tablet (10 mg total) by mouth daily. 30 tablet 0   aspirin EC 81 MG tablet Take 1 tablet (81 mg total) by mouth daily. Swallow whole. 90 tablet 3   atorvastatin (LIPITOR) 80 MG tablet Take 1 tablet (80 mg total) by mouth daily. 30 tablet 0   clopidogrel (PLAVIX) 75 MG tablet Take 1 tablet (75 mg total) by mouth daily. 30 tablet 0   Cyanocobalamin (VITAMIN B-12) 5000 MCG SUBL Take 5,000 mcg by mouth daily.     folic  acid (FOLVITE) A999333 MCG tablet Take 400 mcg by mouth daily.     furosemide (LASIX) 40 MG tablet Take 1 tablet (40 mg total) by mouth daily as needed for fluid or edema. 30 tablet 0   isosorbide mononitrate (IMDUR) 30 MG 24 hr tablet Take 3 tablets (90 mg total) by mouth daily. 90 tablet 3   JARDIANCE 10 MG TABS tablet Take 10 mg by mouth daily.     metoprolol succinate (TOPROL-XL) 25 MG 24 hr tablet Take 3 tablets (75 mg total) by mouth in the morning. & 50 mg in the evening 450 tablet 3   Multiple Vitamin (MULTIVITAMIN WITH MINERALS) TABS tablet Take 1 tablet by  mouth daily.      nitroGLYCERIN (NITROSTAT) 0.4 MG SL tablet Place 1 tablet (0.4 mg total) under the tongue every 5 (five) minutes x 3 doses as needed for chest pain. 30 tablet 12   ondansetron (ZOFRAN) 4 MG tablet Take 4 mg by mouth every 6 (six) hours as needed for nausea or vomiting.      pantoprazole (PROTONIX) 40 MG tablet Take 1 tablet (40 mg total) by mouth daily. 30 tablet 0   potassium chloride SA (K-DUR,KLOR-CON) 20 MEQ tablet Take 20 mEq by mouth See admin instructions. Takes 1 tablet (20 meq) by mouth daily (scheduled) & a 2nd tablet only if has to take a 2nd dose of Lasix     primidone (MYSOLINE) 50 MG tablet Take 50 mg by mouth at bedtime.     PROAIR HFA 108 (90 Base) MCG/ACT inhaler Inhale 1 puff into the lungs every 4 (four) hours as needed for wheezing or shortness of breath.      ranolazine (RANEXA) 500 MG 12 hr tablet Take 1 tablet (500 mg total) by mouth 2 (two) times daily. 60 tablet 11   senna-docusate (SENOKOT-S) 8.6-50 MG tablet Take 1 tablet by mouth at bedtime. 30 tablet 0   VITAMIN D, CHOLECALCIFEROL, PO Take 1 tablet by mouth daily.     No current facility-administered medications for this visit.    Allergies:   Amitriptyline hcl and Sulfonamide derivatives    Social History:  The patient  reports that she quit smoking about 31 years ago. Her smoking use included cigarettes. She has a 20.00 pack-year smoking  history. She has never used smokeless tobacco. She reports that she does not drink alcohol and does not use drugs.   Family History:  The patient's family history includes Arthritis in an other family member; Asthma in an other family member; Cancer in her mother; Coronary artery disease in her brother and sister; Lung disease in an other family member.    ROS:  General:no colds or fevers, no weight changes Skin:no rashes or ulcers HEENT:no blurred vision, no congestion CV:see HPI PUL:see HPI GI:no diarrhea constipation or melena, no indigestion GU:no hematuria, no dysuria MS:no joint pain, no claudication Neuro:no syncope, no lightheadedness Endo:no diabetes, no thyroid disease  Wt Readings from Last 3 Encounters:  04/12/21 131 lb (59.4 kg)  03/31/21 131 lb 6.3 oz (59.6 kg)  03/22/21 134 lb 7.7 oz (61 kg)     PHYSICAL EXAM: VS:  BP 138/62   Pulse 80   Ht '5\' 5"'$  (1.651 m)   Wt 131 lb (59.4 kg)   SpO2 93%   BMI 21.80 kg/m  , BMI Body mass index is 21.8 kg/m. General:Pleasant affect, NAD Skin:Warm and dry, brisk capillary refill HEENT:normocephalic, sclera clear, mucus membranes moist very hard of hearing. Neck:supple, no JVD, no bruits  Heart:S1S2 RRR without murmur, gallup, rub or click Lungs:clear without rales, rhonchi, or wheezes [id number], non tender, + BS, do not palpate liver spleen or masses Ext:no lower ext edema, 2+ pedal pulses, 2+ radial pulses Neuro:alert and oriented, MAE, follows commands, + facial symmetry    EKG:  EKG is not ordered today.   Recent Labs: 03/19/2021: ALT 20 03/20/2021: Magnesium 2.2 03/30/2021: B Natriuretic Peptide 253.0 03/31/2021: BUN 15; Creatinine, Ser 0.68; Hemoglobin 12.9; Platelets 218; Potassium 3.9; Sodium 135    Lipid Panel    Component Value Date/Time   CHOL 200 03/20/2021 1605   TRIG 130 03/20/2021 1605   HDL 49 03/20/2021 1605  CHOLHDL 4.1 03/20/2021 1605   VLDL 26 03/20/2021 1605   LDLCALC 125 (H) 03/20/2021 1605        Other studies Reviewed: Additional studies/ records that were reviewed today include: . CARDIAC CATHETERIZATION   Result Date: 03/21/2021   Severe calcified diffuse three-vessel coronary artery disease.  Right dominant anatomy.   Nonobstructive left main   Ostial 75% calcified LAD with tandem mid 80% and distal 80% stenoses.   Diffusely diseased circumflex with 75% proximal to mid first obtuse marginal.  Mid to distal circumflex 70% before small second obtuse marginal.   Dominant right coronary with previously placed proximal stent having diffuse 50% ISR, 80 to 90% segmental mid stenosis (probable culprit), and continuation of segmental 60% stenosis to the distal segment where the artery opens up to a 3.0 diameter.  PDA and LV branch are large.   Normal LVEDP.  EF 65%. RECOMMENDATIONS: Medical therapy versus CABG versus elevated risk RCA PCI.  Consider heart team approach after conversation with the patient and family about level of aggressiveness that they feel is appropriate. Aggressive risk factor modification and anti-ischemic therapy. If RCA PCI, would recommend femoral approach rather than radial for better catheter support during the intervention.     Echo 03/20/21 IMPRESSIONS     1. Left ventricular ejection fraction, by estimation, is 55 to 60%. The  left ventricle has normal function. The left ventricle has no regional  wall motion abnormalities. Left ventricular diastolic parameters are  consistent with Grade II diastolic  dysfunction (pseudonormalization). Elevated left ventricular end-diastolic  pressure.   2. Right ventricular systolic function is normal. The right ventricular  size is normal. There is normal pulmonary artery systolic pressure.   3. Left atrial size was moderately dilated.   4. The mitral valve is degenerative. Mild mitral valve regurgitation. No  evidence of mitral stenosis.   5. There is partial fusion of the left and non-coronary cusps. The aortic   valve is calcified. There is mild calcification of the aortic valve. There  is mild thickening of the aortic valve. Aortic valve regurgitation is  mild. Mild aortic valve sclerosis  is present, with no evidence of aortic valve stenosis. Aortic  regurgitation PHT measures 582 msec. Aortic valve area, by VTI measures  2.49 cm. Aortic valve mean gradient measures 3.5 mmHg. Aortic valve Vmax  measures 1.23 m/s.   6. Aortic dilatation noted. There is mild dilatation of the aortic root,  measuring 37 mm. There is mild dilatation of the ascending aorta,  measuring 38 mm.   7. The inferior vena cava is normal in size with greater than 50%  respiratory variability, suggesting right atrial pressure of 3 mmHg.   ASSESSMENT AND PLAN:  1.  Chronic angina with recent NSTEMI, cardiac cath with significant disease, pt and family prefer medical therapy. If medication not helping then stent to RCA most likely plan.  Hydralazine and ARB stopped with hypotension with COVID and BP is still soft on home readings.   She continues with angina.  Will add Ranexa and follow up in 4-6 weeks to re-eval if no improvement may need stent.  But this well give her time to recover. Continue ASA.   2.  CAD see above.  3.  HTN -uncontrolled with NSTEMI now soft post covid and with covid.  Meds were decreased.  Continue current meds.   4.  HLD on statin continue.   5.  P. Atrial fib on amiodarone and pt has refused anticoagulation.  Regular  rhythm today, last EKG with atrial paced.  6. Hx tachy brady syndrome with PPM.  Pacer alarmed at 2 in the morning will ask device clinic to check her remote transmission and contact pt. Last eval in July the battery was stable.  I asked if cell phone battery may have alarmed or fire alarm battery could have gone off. They did not believe so.  7. Chronic diastolic HF stable. DOE may be angina. None lying flat in bed.   8.  Night sweats - will check TSH today along with BMP on diuretic.   She is on amiodarone which can affect TSH   Current medicines are reviewed with the patient today.  The patient Has no concerns regarding medicines.  The following changes have been made:  See above Labs/ tests ordered today include:see above  Disposition:   FU:  see above  Signed, Cecilie Kicks, NP  04/12/2021 2:12 PM    [address], [place], [place], [place] Phone: [phone]; Fax: [phone]

## 2021-04-12 ENCOUNTER — Encounter: Payer: Self-pay | Admitting: Cardiology

## 2021-04-12 ENCOUNTER — Ambulatory Visit (INDEPENDENT_AMBULATORY_CARE_PROVIDER_SITE_OTHER): Payer: Medicare Other | Admitting: Cardiology

## 2021-04-12 ENCOUNTER — Other Ambulatory Visit: Payer: Self-pay

## 2021-04-12 ENCOUNTER — Telehealth: Payer: Self-pay

## 2021-04-12 VITALS — BP 138/62 | HR 80 | Ht 65.0 in | Wt 131.0 lb

## 2021-04-12 DIAGNOSIS — I214 Non-ST elevation (NSTEMI) myocardial infarction: Secondary | ICD-10-CM | POA: Diagnosis not present

## 2021-04-12 DIAGNOSIS — R61 Generalized hyperhidrosis: Secondary | ICD-10-CM | POA: Diagnosis not present

## 2021-04-12 DIAGNOSIS — I25119 Atherosclerotic heart disease of native coronary artery with unspecified angina pectoris: Secondary | ICD-10-CM

## 2021-04-12 DIAGNOSIS — I10 Essential (primary) hypertension: Secondary | ICD-10-CM

## 2021-04-12 DIAGNOSIS — I5032 Chronic diastolic (congestive) heart failure: Secondary | ICD-10-CM

## 2021-04-12 DIAGNOSIS — I480 Paroxysmal atrial fibrillation: Secondary | ICD-10-CM | POA: Diagnosis not present

## 2021-04-12 DIAGNOSIS — I495 Sick sinus syndrome: Secondary | ICD-10-CM | POA: Diagnosis not present

## 2021-04-12 DIAGNOSIS — Z950 Presence of cardiac pacemaker: Secondary | ICD-10-CM | POA: Diagnosis not present

## 2021-04-12 MED ORDER — RANOLAZINE ER 500 MG PO TB12: 500.0000 mg | ORAL_TABLET | Freq: Two times a day (BID) | ORAL | 11 refills | Status: DC

## 2021-04-12 NOTE — Patient Instructions (Signed)
Medication Instructions:   Start Ranexa 500 mg Two Times Daily   *If you need a refill on your cardiac medications before your next appointment, please call your pharmacy*   Lab Work: Your physician recommends that you return for lab work in: Today (BMP, TSH)    If you have labs (blood work) drawn today and your tests are completely normal, you will receive your results only by: [place] (if you have MyChart) OR A paper copy in the mail If you have any lab test that is abnormal or we need to change your treatment, we will call you to review the results.   Testing/Procedures: NONE    Follow-Up: At [facility], you and your health needs are our priority.  As part of our continuing mission to provide you with exceptional heart care, we have created designated Provider Care Teams.  These Care Teams include your primary Cardiologist (physician) and Advanced Practice Providers (APPs -  Physician Assistants and Nurse Practitioners) who all work together to provide you with the care you need, when you need it.  We recommend signing up for the patient portal called "MyChart".  Sign up information is provided on this After Visit Summary.  MyChart is used to connect with patients for Virtual Visits (Telemedicine).  Patients are able to view lab/test results, encounter notes, upcoming appointments, etc.  Non-urgent messages can be sent to your provider as well.   To learn more about what you can do with MyChart, go to [email or web address].    Your next appointment:    Next available   The format for your next appointment:   In Person  Provider:   Rozann Lesches, MD, Bernerd Pho, PA-C, or Ermalinda Barrios, PA-C   Other Instructions Thank you for choosing [facility]!

## 2021-04-12 NOTE — Telephone Encounter (Signed)
Manual transmission received this morning at 2 am.  Normal device function.  Nothing on device to indicate there was an alert.  Advised patient that her remote monitor may receive updates from time to time.  If the lights bother her, it is ok to cover monitor with a towel.

## 2021-04-12 NOTE — Telephone Encounter (Signed)
-----   Message from Damian Leavell, RN sent at 04/12/2021  3:19 PM EDT ----- Regarding: please advise  ----- Message ----- From: Isaiah Serge, NP Sent: 04/12/2021   2:12 PM EDT To: Damian Leavell, RN  This is one of Dr. Tanna Furry pacer pts.  Her bedside device or PPM alarmed at 0200 AM and they think they sent in a reading.  I have no idea who to send this too.  But could you direct me please?  To see why pacer or remote monitor alarmed.  Thanks.

## 2021-04-17 DIAGNOSIS — R058 Other specified cough: Secondary | ICD-10-CM | POA: Insufficient documentation

## 2021-04-17 DIAGNOSIS — R110 Nausea: Secondary | ICD-10-CM | POA: Insufficient documentation

## 2021-04-17 DIAGNOSIS — R059 Cough, unspecified: Secondary | ICD-10-CM | POA: Diagnosis not present

## 2021-04-19 ENCOUNTER — Telehealth: Payer: Self-pay

## 2021-04-19 ENCOUNTER — Other Ambulatory Visit: Payer: Self-pay | Admitting: Cardiology

## 2021-04-19 MED ORDER — AMLODIPINE BESYLATE 10 MG PO TABS: 10.0000 mg | ORAL_TABLET | Freq: Every day | ORAL | 0 refills | Status: DC

## 2021-04-19 NOTE — Telephone Encounter (Signed)
Medication refill for Amlodipine 10 mg tablets.

## 2021-04-22 ENCOUNTER — Ambulatory Visit: Payer: Medicare Other | Admitting: *Deleted

## 2021-04-22 NOTE — Progress Notes (Signed)
Cardiology Office Note  Date: 04/23/2021   ID: [name], [name] January 17, 1936, MRN [id number]  PCP:  Celene Squibb, MD  Cardiologist:  Rozann Lesches, MD Electrophysiologist:  [name], MD   Chief Complaint: Cardiac follow-up  History of Present Illness: [name] is a 85 y.o. female with a history of atrial fibrillation, COPD, CAD, HTN, GERD, tachybradycardia syndrome with dual-chamber pacemaker, HLD, neuromuscular disorder  She was last seen by Cecilie Kicks, NP 04/12/2021.  She had a previous NSTEMI in August 2022.  Recommendations were for medical therapy versus CABG versus elevated risk RCA PCI.  Patient opted for medical therapy.  Plavix was added and to be continued for 1 year.  Blood pressure was elevated on admission with amlodipine added.  BiDil changed to long-acting Imdur.  Hydralazine was added.  She was continuing on beta-blocker and ARB.  She declined anticoagulation for atrial fibrillation.  Tachybradycardia syndrome with PPM.  She was on amiodarone.  Later in August on the 27th she was readmitted for chest pain with associated hypotension.  She was COVID-positive.  Both her ARB and hydralazine were stopped.  Her Imdur was increased to 90 mg secondary to chest pain.  During visit with [name] she was still having angina least once a week and occasionally more often.  She had DOE as well.  She was taking sublingual NTG at times if severe pain.  Pain would usually last 15 to 20 minutes.  Resolved with rest.  She was continuing with anginal symptoms.  Ranexa was started and to follow-up in 4 weeks to reevaluate if no improvement may need a stent to RCA per [name].  Blood pressure was uncontrolled with NSTEMI but soft post COVID.  Meds were decreased.  Continuing current medications.  Continuing statin medication for hyperlipidemia.  She is continuing amiodarone for paroxysmal atrial fibrillation.  Had declined anticoagulation.  Tachybradycardia syndrome with PPM.  Pacer  alarmed 2 in the morning.  Plan was to ask device clinic to recheck a remote transmission and contact patient.  Her chronic diastolic HF is stable.  [name] stated DOE could be angina.  She is here for 4-week follow-up after recent visit with Mrs. Cecilie Kicks nurse practitioner.  At last visit she had been started on Ranexa.  He is here today with her daughter.  Still continuing to have chest pain in spite of addition of Ranexa.  She still using sublingual nitroglycerin periodically for chest pain, but less frequently since starting Ranexa.  Her daughter states patient has been speaking of having intervention to RCA as mentioned by [name] on prior visit.  Previously they had had initially elected to proceed with medical therapy only.  Patient's daughter and she have discussed and would like to proceed with intervention to the RCA.  We discussed the risk and benefits of cardiac catheterization.  She has had previous stents in the past.  She verbalizes understanding of the risk and benefits and wishes to proceed.  I advised her and daughter I would need to speak with primary cardiologist Dr. Domenic Polite regarding proceeding with cardiac catheterization and will get back to them.    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   CATARACT EXTRACTION     CHOLECYSTECTOMY     COLONOSCOPY  2011   COLONOSCOPY N/A 10/19/2014   Procedure: COLONOSCOPY;  Surgeon: Rogene Houston, MD;  [facility];  Service: Endoscopy;  Laterality: N/A;  1030   ESOPHAGEAL DILATION N/A 05/02/2020   Procedure: ESOPHAGEAL DILATION;  Surgeon: Rogene Houston, MD;  [facility];  Service: Gastroenterology;  Laterality: N/A;   ESOPHAGOGASTRODUODENOSCOPY N/A 10/26/2014   Procedure: ESOPHAGOGASTRODUODENOSCOPY (EGD);  Surgeon: Rogene Houston, MD;  [facility];  Service: Endoscopy;  Laterality: N/A;  79 - Dr. has lunch and learn   ESOPHAGOGASTRODUODENOSCOPY (EGD) WITH PROPOFOL N/A 05/02/2020   Procedure: ESOPHAGOGASTRODUODENOSCOPY (EGD) WITH PROPOFOL;  Surgeon: Rogene Houston, MD;  [facility];  Service: Gastroenterology;  Laterality: N/A;  250   Esophagogastroduodenoscopy with esophageal dilation  2004, 2006, 2007   GIVENS CAPSULE STUDY N/A 10/31/2014   Procedure: GIVENS CAPSULE STUDY;  Surgeon: Rogene Houston, MD;  [facility];  Service: Endoscopy;  Laterality: N/A;  730 -- pacemaker--needs monitoring--outpatient bed   INSERT / REPLACE / REMOVE PACEMAKER  2010   LEFT HEART CATH AND CORONARY ANGIOGRAPHY N/A 03/21/2021   Procedure: LEFT HEART CATH AND CORONARY ANGIOGRAPHY;  Surgeon: Belva Crome, MD;  Location: [facility] CV LAB;  Service: Cardiovascular;  Laterality: N/A;   LEFT HEART CATHETERIZATION WITH CORONARY ANGIOGRAM N/A 11/17/2011   Procedure: LEFT HEART CATHETERIZATION WITH CORONARY ANGIOGRAM;  Surgeon: Sherren Mocha, MD;  Location[facility] C[facility] LAB;  Service: Cardiovascular;  Laterality: N/A;   LEFT HEART CATHETERIZATION WITH CORONARY ANGIOGRAM N/A 09/26/2014   Procedure: LEFT HEART CATHETERIZATION WITH CORONARY ANGIOGRAM;  Surgeon: Leonie Man, MD;  Location: [facility] CATH LAB;  Service: Cardiovascular;  Laterality: N/A;    Right rotator cuff repair     SHOULDER ACROMIOPLASTY Right 05/30/2015   Procedure: RIGHT SHOULDER ACROMIOPLASTY;  Surgeon: Carole Civil, MD;  Location: [facility] OR[facility];  Service: Orthopedics;  Laterality: Right;   SHOULDER OPEN ROTATOR CUFF REPAIR Right 05/30/2015   Procedure: OPEN ROTATOR CUFF REPAIR RIGHT SHOULDER;  Surgeon: Carole Civil, MD;  Location: [facility] OR[facility];  Service: Orthopedics;  Laterality: Right;   SHOULDER OPEN ROTATOR CUFF REPAIR Left 10/22/2016   Procedure: ROTATOR CUFF REPAIR SHOULDER OPEN;  Surgeon: Carole Civil, MD;  Location: [facility];  Service: Orthopedics;  Laterality: Left;   Subxiphoid pericardial window  11/10   VAGINAL HYSTERECTOMY      Current Outpatient Medications  Medication Sig Dispense Refill   ALPRAZolam (XANAX) 1 MG tablet Take 1 mg by mouth 3 (three) times daily as needed.     amiodarone (PACERONE) 200 MG tablet Take 200 mg Daily Monday - Saturday None on Sunday 90 tablet 3   amLODipine (NORVASC) 10 MG tablet Take 1 tablet (10 mg total) by mouth daily. 30 tablet 0  aspirin EC 81 MG tablet Take 1 tablet (81 mg total) by mouth daily. Swallow whole. 90 tablet 3   atorvastatin (LIPITOR) 80 MG tablet Take 1 tablet (80 mg total) by mouth daily. 30 tablet 0   clopidogrel (PLAVIX) 75 MG tablet Take 1 tablet (75 mg total) by mouth daily. 30 tablet 0   Cyanocobalamin (VITAMIN B-12) 5000 MCG SUBL Take 5,000 mcg by mouth daily.     folic acid (FOLVITE) A999333 MCG tablet Take 400 mcg by mouth daily.     furosemide (LASIX) 40 MG tablet Take 1 tablet (40 mg total) by mouth daily as needed for fluid or edema. 30 tablet 0   isosorbide mononitrate (IMDUR) 30 MG 24 hr tablet Take 3 tablets (90 mg total) by mouth daily. 90 tablet 3   JARDIANCE 10 MG TABS tablet Take 10 mg by mouth daily.     metoprolol succinate (TOPROL-XL) 25 MG 24 hr tablet Take 3 tablets (75 mg total) by mouth in the morning. & 50 mg in the evening 450 tablet 3   Multiple Vitamin (MULTIVITAMIN WITH  MINERALS) TABS tablet Take 1 tablet by mouth daily.      nitroGLYCERIN (NITROSTAT) 0.4 MG SL tablet Place 1 tablet (0.4 mg total) under the tongue every 5 (five) minutes x 3 doses as needed for chest pain. 30 tablet 12   ondansetron (ZOFRAN) 4 MG tablet Take 4 mg by mouth every 6 (six) hours as needed for nausea or vomiting.      pantoprazole (PROTONIX) 40 MG tablet TAKE (1) TABLET BY MOUTH ONCE DAILY. 30 tablet 1   potassium chloride SA (K-DUR,KLOR-CON) 20 MEQ tablet Take 20 mEq by mouth See admin instructions. Takes 1 tablet (20 meq) by mouth daily (scheduled) & a 2nd tablet only if has to take a 2nd dose of Lasix     primidone (MYSOLINE) 50 MG tablet Take 50 mg by mouth at bedtime.     PROAIR HFA 108 (90 Base) MCG/ACT inhaler Inhale 1 puff into the lungs every 4 (four) hours as needed for wheezing or shortness of breath.      promethazine (PHENERGAN) 6.25 MG/5ML syrup Take by mouth every 6 (six) hours as needed for nausea or vomiting.     ranolazine (RANEXA) 500 MG 12 hr tablet Take 1 tablet (500 mg total) by mouth 2 (two) times daily. 60 tablet 11   senna-docusate (SENOKOT-S) 8.6-50 MG tablet Take 1 tablet by mouth at bedtime. 30 tablet 0   VITAMIN D, CHOLECALCIFEROL, PO Take 1 tablet by mouth daily.     No current facility-administered medications for this visit.   Allergies:  Amitriptyline hcl and Sulfonamide derivatives   Social History: The patient  reports that she quit smoking about 31 years ago. Her smoking use included cigarettes. She has a 20.00 pack-year smoking history. She has never used smokeless tobacco. She reports that she does not drink alcohol and does not use drugs.   Family History: The patient's family history includes Arthritis in an other family member; Asthma in an other family member; Cancer in her mother; Coronary artery disease in her brother and sister; Lung disease in an other family member.   ROS:  Please see the history of present illness. Otherwise, complete  review of systems is positive for none.  All other systems are reviewed and negative.   Physical Exam: VS:  BP (!) 160/88 (BP Location: Left Arm, Patient Position: Sitting, Cuff Size: Normal)   Pulse 71   Ht 5'  5" (1.651 m)   Wt 132 lb 9.6 oz (60.1 kg)   SpO2 95%   BMI 22.07 kg/m , BMI Body mass index is 22.07 kg/m.  Wt Readings from Last 3 Encounters:  04/23/21 132 lb 9.6 oz (60.1 kg)  04/12/21 131 lb (59.4 kg)  03/31/21 131 lb 6.3 oz (59.6 kg)    General: Patient appears comfortable at rest. Neck: Supple, no elevated JVP or carotid bruits, no thyromegaly. Lungs: Clear to auscultation, nonlabored breathing at rest. Cardiac: Regular rate and rhythm, no S3 or significant systolic murmur, no pericardial rub. Extremities: No pitting edema, distal pulses 2+. Skin: Warm and dry. Musculoskeletal: No kyphosis. Neuropsychiatric: Alert and oriented x3, affect grossly appropriate.  ECG:  EKG 03/30/2021 atrial paced rhythm with prolonged AV conduction.  Rate of 61, cannot rule out anterior infarct age undetermined.  Recent Labwork: 03/19/2021: ALT 20; AST 28 03/20/2021: Magnesium 2.2 03/30/2021: B Natriuretic Peptide 253.0 03/31/2021: BUN 15; Creatinine, Ser 0.68; Hemoglobin 12.9; Platelets 218; Potassium 3.9; Sodium 135     Component Value Date/Time   CHOL 200 03/20/2021 1605   TRIG 130 03/20/2021 1605   HDL 49 03/20/2021 1605   CHOLHDL 4.1 03/20/2021 1605   VLDL 26 03/20/2021 1605   LDLCALC 125 (H) 03/20/2021 1605    Other Studies Reviewed Today:   LEFT HEART CATH AND CORONARY ANGIOGRAPHY     03/21/2021   Conclusion      Severe calcified diffuse three-vessel coronary artery disease.  Right dominant anatomy.   Nonobstructive left main   Ostial 75% calcified LAD with tandem mid 80% and distal 80% stenoses.   Diffusely diseased circumflex with 75% proximal to mid first obtuse marginal.  Mid to distal circumflex 70% before small second obtuse marginal.   Dominant right coronary  with previously placed proximal stent having diffuse 50% ISR, 80 to 90% segmental mid stenosis (probable culprit), and continuation of segmental 60% stenosis to the distal segment where the artery opens up to a 3.0 diameter.  PDA and LV branch are large.   Normal LVEDP.  EF 65%.   RECOMMENDATIONS: Medical therapy versus CABG versus elevated risk RCA PCI.  Consider heart team approach after conversation with the patient and family about level of aggressiveness that they feel is appropriate. Aggressive risk factor modification and anti-ischemic therapy. If RCA PCI, would recommend femoral approach rather than radial for better catheter support during the intervention. Diagnostic Dominance: Right       Echocardiogram 03/20/2021   1. Left ventricular ejection fraction, by estimation, is 55 to 60%. The  left ventricle has normal function. The left ventricle has no regional  wall motion abnormalities. Left ventricular diastolic parameters are  consistent with Grade II diastolic  dysfunction (pseudonormalization). Elevated left ventricular end-diastolic  pressure.   2. Right ventricular systolic function is normal. The right ventricular  size is normal. There is normal pulmonary artery systolic pressure.   3. Left atrial size was moderately dilated.   4. The mitral valve is degenerative. Mild mitral valve regurgitation. No  evidence of mitral stenosis.   5. There is partial fusion of the left and non-coronary cusps. The aortic  valve is calcified. There is mild calcification of the aortic valve. There  is mild thickening of the aortic valve. Aortic valve regurgitation is  mild. Mild aortic valve sclerosis  is present, with no evidence of aortic valve stenosis. Aortic  regurgitation PHT measures 582 msec. Aortic valve area, by VTI measures  2.49 cm. Aortic valve mean gradient  measures 3.5 mmHg. Aortic valve Vmax  measures 1.23 m/s.   6. Aortic dilatation noted. There is mild dilatation of  the aortic root,  measuring 37 mm. There is mild dilatation of the ascending aorta,  measuring 38 mm.   7. The inferior vena cava is normal in size with greater than 50%  respiratory variability, suggesting right atrial pressure of 3 mmHg.    CT angio chest/abdomen/pelvis to rule out aortic dissection IMPRESSION: No evidence of thoracoabdominal aortic aneurysm or dissection.   11 mm low-density lesion in the pancreatic head, new. This is poorly evaluated on CT. Follow-up MRI abdomen with/without contrast is suggested in 4 weeks (on an outpatient basis).     Assessment and Plan:  1. Chronic stable angina ([place])   2. CAD in native artery   3. Essential hypertension, benign   4. Mixed hyperlipidemia   5. Paroxysmal atrial fibrillation (HCC)   6. Tachycardia-bradycardia syndrome ([place])   7. Chronic diastolic heart failure ([place])   8. Night sweats    1. Chronic stable angina ([place]) Continues with anginal symptoms in spite of addition of Ranexa at last visit with Cecilie Kicks, NP.  She states the episodes are less frequent since starting but she is using some occasional sublingual nitroglycerin for relief.  She is mentioning she would like to proceed with intervention on RCA as mentioned by [place] NP at last visit.  She has diffuse three-vessel disease.  Continue Ranexa 500 mg p.o. twice daily, continue Imdur 90 mg daily.  Continue sublingual nitroglycerin as needed.  Continue aspirin 81 mg daily.  2. CAD in native artery Recent cardiac catheterization on March 21, 2021 with severe calcified diffuse three-vessel CAD.  See cath report above.  Recommendations were medical therapy versus CABG versus elevated risk RCA PCI.  Consider heart team approach after conversation with patient and family about level of aggressiveness they felt appropriate.  Patient initially opted for medical therapy.  Today daughter and patient have spoken and discussed possible intervention to the RCA to see if this  may help with her chest pain/anginal symptoms.  They are choosing to opt for PCI to RCA.  We discussed risk and benefits of cardiac catheterization and PCI.  She has had 2 previous interventions in the past.  She verbalizes understanding and wishes to proceed.  Advised I would speak to her primary cardiologist and let them know about scheduling cardiac catheterization.  Advised to continue current medications including aspirin 81 mg daily, Ranexa 500 mg p.o. twice daily, Imdur 90 mg p.o. daily, sublingual nitroglycerin as needed, Plavix 75 mg daily, Jardiance 10 mg daily, Toprol-XL 75 mg daily.  3. Essential hypertension, benign Blood pressure is elevated today at 160/88.  Daughter brings with her a log of blood pressures which appear to be running in the 0000000 to 123456 systolic and 0000000 to [id number] diastolic.  Continue amlodipine 10 mg daily, Lasix 40 mg daily, Toprol-XL 75 mg daily  4. Mixed hyperlipidemia Continue atorvastatin 80 mg p.o. daily.  5. Paroxysmal atrial fibrillation (HCC) Continue amiodarone 200 mg daily.  Continue Toprol-XL 25 mg daily.  Rate control today at 71.  Not on [place].  On DAPT therapy with aspirin and Plavix.  6. Tachycardia-bradycardia syndrome ([place]) Medtronic pacemaker in place.  Recent remote device check showed normal histograms, leads and battery life stable for patient.  There were 117 atrial arrhythmias detected longest lasting was 31 minutes, atrial fibrillation burden was 0.2% of the time.  Some episodes of AT and  some AF question ? [place] per Dr. Lovena Le.  7. Chronic diastolic heart failure ([place]) Most recent echocardiogram 03/20/2021 with EF of 55 to 60%.  No WMA's, G2 DD.  LA moderately dilated, mild MR.  Mild aortic regurgitation, mild dilation of aortic root measuring 37 mm.  Mild dilation of ascending aorta measuring 38 mm.  Continue Toprol-XL 75 mg daily, continue Lasix 40 mg daily.  Continue potassium supplementation continue Jardiance 10 mg daily.  8. Night  sweats History of night sweats.  Patient states she may have to change gowns 2-3 times during the night.  Medication Adjustments/Labs and Tests Ordered: Current medicines are reviewed at length with the patient today.  Concerns regarding medicines are outlined above.   Disposition: Follow-up with Dr. Domenic Polite or APP pending PCI  Signed, Levell July, NP 04/23/2021 12:00 PM    [facility] at [place], [place], [place] [address] Phone: [phone]; Fax: [phone]

## 2021-04-23 ENCOUNTER — Ambulatory Visit (INDEPENDENT_AMBULATORY_CARE_PROVIDER_SITE_OTHER): Payer: Medicare Other | Admitting: Family Medicine

## 2021-04-23 ENCOUNTER — Encounter: Payer: Self-pay | Admitting: Family Medicine

## 2021-04-23 VITALS — BP 160/88 | HR 71 | Ht 65.0 in | Wt 132.6 lb

## 2021-04-23 DIAGNOSIS — I208 Other forms of angina pectoris: Secondary | ICD-10-CM | POA: Diagnosis not present

## 2021-04-23 DIAGNOSIS — I10 Essential (primary) hypertension: Secondary | ICD-10-CM

## 2021-04-23 DIAGNOSIS — I2510 Atherosclerotic heart disease of native coronary artery without angina pectoris: Secondary | ICD-10-CM | POA: Diagnosis not present

## 2021-04-23 DIAGNOSIS — I5032 Chronic diastolic (congestive) heart failure: Secondary | ICD-10-CM

## 2021-04-23 DIAGNOSIS — E782 Mixed hyperlipidemia: Secondary | ICD-10-CM

## 2021-04-23 DIAGNOSIS — I495 Sick sinus syndrome: Secondary | ICD-10-CM

## 2021-04-23 DIAGNOSIS — I480 Paroxysmal atrial fibrillation: Secondary | ICD-10-CM | POA: Diagnosis not present

## 2021-04-23 DIAGNOSIS — R61 Generalized hyperhidrosis: Secondary | ICD-10-CM | POA: Diagnosis not present

## 2021-04-23 NOTE — Patient Instructions (Addendum)
Medication Instructions:  Continue all current medications.  Labwork: none  Testing/Procedures: Possible stent procedure   Follow-Up: Pending   Any Other Special Instructions Will Be Listed Below (If Applicable).   If you need a refill on your cardiac medications before your next appointment, please call your pharmacy.

## 2021-04-25 ENCOUNTER — Other Ambulatory Visit: Payer: Self-pay | Admitting: *Deleted

## 2021-04-25 NOTE — Patient Outreach (Signed)
[facility]) Care Management  04/25/2021  [name] [birth year] [id number]   Telephone Assessment-unsuccessful  RN attempted outreach call however unsuccessful. RN able to leave a HIPAA approved voice message requesting a call back.   Will attempt another outreach call over the next week for ongoing [facility] services.  [name], RN Care Management Coordinator [place] Office [phone]

## 2021-04-25 NOTE — Addendum Note (Signed)
Addended by: Laurine Blazer on: 04/25/2021 11:34 AM   Modules accepted: Orders

## 2021-04-25 NOTE — Progress Notes (Signed)
Daughter (April) notified.  Referral entered into epic & sent to [facility] for notification.

## 2021-04-26 DIAGNOSIS — I2510 Atherosclerotic heart disease of native coronary artery without angina pectoris: Secondary | ICD-10-CM | POA: Diagnosis not present

## 2021-04-26 DIAGNOSIS — I5032 Chronic diastolic (congestive) heart failure: Secondary | ICD-10-CM | POA: Diagnosis not present

## 2021-04-26 DIAGNOSIS — I214 Non-ST elevation (NSTEMI) myocardial infarction: Secondary | ICD-10-CM | POA: Diagnosis not present

## 2021-04-26 DIAGNOSIS — R778 Other specified abnormalities of plasma proteins: Secondary | ICD-10-CM | POA: Diagnosis not present

## 2021-04-26 DIAGNOSIS — I480 Paroxysmal atrial fibrillation: Secondary | ICD-10-CM | POA: Diagnosis not present

## 2021-04-26 DIAGNOSIS — I169 Hypertensive crisis, unspecified: Secondary | ICD-10-CM | POA: Diagnosis not present

## 2021-04-26 DIAGNOSIS — R9431 Abnormal electrocardiogram [ECG] [EKG]: Secondary | ICD-10-CM | POA: Diagnosis not present

## 2021-04-26 DIAGNOSIS — I160 Hypertensive urgency: Secondary | ICD-10-CM | POA: Diagnosis not present

## 2021-04-26 DIAGNOSIS — K869 Disease of pancreas, unspecified: Secondary | ICD-10-CM | POA: Diagnosis not present

## 2021-04-26 DIAGNOSIS — H905 Unspecified sensorineural hearing loss: Secondary | ICD-10-CM | POA: Diagnosis not present

## 2021-04-26 DIAGNOSIS — R627 Adult failure to thrive: Secondary | ICD-10-CM | POA: Diagnosis not present

## 2021-04-29 DIAGNOSIS — I2510 Atherosclerotic heart disease of native coronary artery without angina pectoris: Secondary | ICD-10-CM | POA: Diagnosis not present

## 2021-04-29 DIAGNOSIS — R0602 Shortness of breath: Secondary | ICD-10-CM | POA: Diagnosis not present

## 2021-04-30 DIAGNOSIS — R778 Other specified abnormalities of plasma proteins: Secondary | ICD-10-CM | POA: Diagnosis not present

## 2021-04-30 DIAGNOSIS — R9431 Abnormal electrocardiogram [ECG] [EKG]: Secondary | ICD-10-CM | POA: Diagnosis not present

## 2021-04-30 DIAGNOSIS — R627 Adult failure to thrive: Secondary | ICD-10-CM | POA: Diagnosis not present

## 2021-04-30 DIAGNOSIS — I169 Hypertensive crisis, unspecified: Secondary | ICD-10-CM | POA: Diagnosis not present

## 2021-04-30 DIAGNOSIS — K869 Disease of pancreas, unspecified: Secondary | ICD-10-CM | POA: Diagnosis not present

## 2021-04-30 DIAGNOSIS — I2510 Atherosclerotic heart disease of native coronary artery without angina pectoris: Secondary | ICD-10-CM | POA: Diagnosis not present

## 2021-04-30 DIAGNOSIS — I480 Paroxysmal atrial fibrillation: Secondary | ICD-10-CM | POA: Diagnosis not present

## 2021-04-30 DIAGNOSIS — I214 Non-ST elevation (NSTEMI) myocardial infarction: Secondary | ICD-10-CM | POA: Diagnosis not present

## 2021-04-30 DIAGNOSIS — I5032 Chronic diastolic (congestive) heart failure: Secondary | ICD-10-CM | POA: Diagnosis not present

## 2021-04-30 DIAGNOSIS — I160 Hypertensive urgency: Secondary | ICD-10-CM | POA: Diagnosis not present

## 2021-05-01 ENCOUNTER — Other Ambulatory Visit: Payer: Self-pay | Admitting: *Deleted

## 2021-05-01 DIAGNOSIS — I160 Hypertensive urgency: Secondary | ICD-10-CM | POA: Diagnosis not present

## 2021-05-01 DIAGNOSIS — I214 Non-ST elevation (NSTEMI) myocardial infarction: Secondary | ICD-10-CM | POA: Diagnosis not present

## 2021-05-01 DIAGNOSIS — I169 Hypertensive crisis, unspecified: Secondary | ICD-10-CM | POA: Diagnosis not present

## 2021-05-01 DIAGNOSIS — R9431 Abnormal electrocardiogram [ECG] [EKG]: Secondary | ICD-10-CM | POA: Diagnosis not present

## 2021-05-01 DIAGNOSIS — K869 Disease of pancreas, unspecified: Secondary | ICD-10-CM | POA: Diagnosis not present

## 2021-05-01 DIAGNOSIS — R778 Other specified abnormalities of plasma proteins: Secondary | ICD-10-CM | POA: Diagnosis not present

## 2021-05-01 DIAGNOSIS — I480 Paroxysmal atrial fibrillation: Secondary | ICD-10-CM | POA: Diagnosis not present

## 2021-05-01 DIAGNOSIS — I2510 Atherosclerotic heart disease of native coronary artery without angina pectoris: Secondary | ICD-10-CM | POA: Diagnosis not present

## 2021-05-01 DIAGNOSIS — R627 Adult failure to thrive: Secondary | ICD-10-CM | POA: Diagnosis not present

## 2021-05-01 DIAGNOSIS — I5032 Chronic diastolic (congestive) heart failure: Secondary | ICD-10-CM | POA: Diagnosis not present

## 2021-05-01 NOTE — Patient Outreach (Signed)
[facility]) Care Management  05/01/2021  [name] 07-03-1936 [id number]  Telephone Assessment-Successful-OTHER MI  RN spoke with pt today and received an update on pt's ongoing care. Pt states April remains involved with assisting with her ongoing ADLs and transportation sources. Pt without issues or needs this month and continue to manage her care well with no acute encounters. Plan of care reviewed and discussed along with all interventions and goals. All updates noted within the plan of care for ongoing understanding.  Will follow up next month and continue to communicate with the provider on pt's disposition.   Goals Addressed             This Visit's Progress    THN--Make and Keep All Appointments   On track    Timeframe:  Short-Term Goal Priority:  Medium Start Date:    03/25/2021                         Expected End Date:  06/03/2021  Follow Up Date: 05/29/2021  Barriers: Health Behaviors Knowledge                        - ask family or friend for a ride - call to cancel if needed - keep a calendar with appointment dates    Why is this important?   Part of staying healthy is seeing the doctor for follow-up care.  If you forget your appointments, there are some things you can do to stay on track.    Notes:  9/28-Reports caregiver April (niece) continue to help within the home with all ADL and provides transportation to all her medical appointments.No missed appointments. Will continue to monitor due to her recent medical issues.  8/22- Discussed with daughter [name] all pending appointments post d/c orders and verified pt has sufficient transportation to all her pending appointments. Pt's niece April is also a caregiver and assist the pt with her pill box refills and transporting pt to all her medical appointments. In addition to running needed errands.      THN--Matintain My Quality of Life   On track    Timeframe:  Long-Range Goal Priority:   Medium Start Date:  03/25/2021                           Expected End Date:    07/03/2021                   Follow Up Date: 05/29/2021    - complete a living will - do one enjoyable thing every day - make shared treatment decisions with doctor - name a health care proxy (decision maker) - spend time outdoors at least 3 times a week - strengthen or fix relationships with loved ones  Barriers: Health Behaviors Knowledge    Why is this important?   Having a long-term illness can be scary.  It can also be stressful for you and your caregiver.  These steps may help.    Notes:  8/22-Discussed healthy habits to improve quality of life and all goals with interventions noted above. Stress the importance of seek medical attention when needed with any acute symptoms of encounters.          [name], RN Care Management Coordinator [place] Office [phone]

## 2021-05-02 ENCOUNTER — Other Ambulatory Visit: Payer: Self-pay | Admitting: *Deleted

## 2021-05-02 ENCOUNTER — Other Ambulatory Visit (HOSPITAL_COMMUNITY): Payer: Self-pay | Admitting: Family Medicine

## 2021-05-02 ENCOUNTER — Other Ambulatory Visit: Payer: Self-pay | Admitting: Family Medicine

## 2021-05-02 DIAGNOSIS — I2510 Atherosclerotic heart disease of native coronary artery without angina pectoris: Secondary | ICD-10-CM | POA: Diagnosis not present

## 2021-05-02 DIAGNOSIS — K8689 Other specified diseases of pancreas: Secondary | ICD-10-CM | POA: Diagnosis not present

## 2021-05-02 DIAGNOSIS — I10 Essential (primary) hypertension: Secondary | ICD-10-CM | POA: Diagnosis not present

## 2021-05-02 DIAGNOSIS — I4891 Unspecified atrial fibrillation: Secondary | ICD-10-CM | POA: Diagnosis not present

## 2021-05-02 NOTE — Patient Outreach (Signed)
[facility]) Care Management  05/02/2021  LUNAH LOSASSO Jan 04, 1936 [id number]   Care Coordination-Successful  Pt called RN requested assistance with contact her primary provider's office. Pt states she recent had an office and the provider recommended a MRI to evaluate a medical condition. Pt states her niece was present and disagreed abruptly. Pt states she did not like the way her niece spoke with the provider's office and requested I call  office to request her provider to call her back with other options. States she would not be able to have an MRI due to her pacemaker. States she would be willing to undergo a scan or other invasive testing that would not be in conflict with her pacemaker. Verified exactly what pt has requested RN to do by contacting her provider's office and request someone to call her directly on her cell number and speak with her only to arrange possible test as recommended. Pt was upset but appreciative for [facility] assistance.  RN called the provider's office and made a request for the provider's nurse or PA to call the pt directly with options for testing as previously recommended. No other request at this time. RN will follow up with pt in a few weeks with an update on her ongoing management of care.  [name], RN Care Management Coordinator [place] Office [phone]

## 2021-05-06 ENCOUNTER — Encounter: Payer: Medicare Other | Admitting: Thoracic Surgery (Cardiothoracic Vascular Surgery)

## 2021-05-06 ENCOUNTER — Other Ambulatory Visit: Payer: Self-pay | Admitting: Family Medicine

## 2021-05-06 ENCOUNTER — Other Ambulatory Visit (HOSPITAL_COMMUNITY): Payer: Self-pay | Admitting: Family Medicine

## 2021-05-06 DIAGNOSIS — K8689 Other specified diseases of pancreas: Secondary | ICD-10-CM

## 2021-05-07 DIAGNOSIS — R778 Other specified abnormalities of plasma proteins: Secondary | ICD-10-CM | POA: Diagnosis not present

## 2021-05-07 DIAGNOSIS — R9431 Abnormal electrocardiogram [ECG] [EKG]: Secondary | ICD-10-CM | POA: Diagnosis not present

## 2021-05-07 DIAGNOSIS — I2510 Atherosclerotic heart disease of native coronary artery without angina pectoris: Secondary | ICD-10-CM | POA: Diagnosis not present

## 2021-05-07 DIAGNOSIS — K869 Disease of pancreas, unspecified: Secondary | ICD-10-CM | POA: Diagnosis not present

## 2021-05-07 DIAGNOSIS — I214 Non-ST elevation (NSTEMI) myocardial infarction: Secondary | ICD-10-CM | POA: Diagnosis not present

## 2021-05-07 DIAGNOSIS — I480 Paroxysmal atrial fibrillation: Secondary | ICD-10-CM | POA: Diagnosis not present

## 2021-05-07 DIAGNOSIS — I160 Hypertensive urgency: Secondary | ICD-10-CM | POA: Diagnosis not present

## 2021-05-07 DIAGNOSIS — R627 Adult failure to thrive: Secondary | ICD-10-CM | POA: Diagnosis not present

## 2021-05-07 DIAGNOSIS — I169 Hypertensive crisis, unspecified: Secondary | ICD-10-CM | POA: Diagnosis not present

## 2021-05-07 DIAGNOSIS — I5032 Chronic diastolic (congestive) heart failure: Secondary | ICD-10-CM | POA: Diagnosis not present

## 2021-05-08 ENCOUNTER — Other Ambulatory Visit: Payer: Self-pay | Admitting: Cardiology

## 2021-05-08 ENCOUNTER — Encounter: Payer: Medicare Other | Admitting: Thoracic Surgery (Cardiothoracic Vascular Surgery)

## 2021-05-09 DIAGNOSIS — I160 Hypertensive urgency: Secondary | ICD-10-CM | POA: Diagnosis not present

## 2021-05-09 DIAGNOSIS — R9431 Abnormal electrocardiogram [ECG] [EKG]: Secondary | ICD-10-CM | POA: Diagnosis not present

## 2021-05-09 DIAGNOSIS — I169 Hypertensive crisis, unspecified: Secondary | ICD-10-CM | POA: Diagnosis not present

## 2021-05-09 DIAGNOSIS — I2510 Atherosclerotic heart disease of native coronary artery without angina pectoris: Secondary | ICD-10-CM | POA: Diagnosis not present

## 2021-05-09 DIAGNOSIS — I480 Paroxysmal atrial fibrillation: Secondary | ICD-10-CM | POA: Diagnosis not present

## 2021-05-09 DIAGNOSIS — R627 Adult failure to thrive: Secondary | ICD-10-CM | POA: Diagnosis not present

## 2021-05-09 DIAGNOSIS — R778 Other specified abnormalities of plasma proteins: Secondary | ICD-10-CM | POA: Diagnosis not present

## 2021-05-09 DIAGNOSIS — I214 Non-ST elevation (NSTEMI) myocardial infarction: Secondary | ICD-10-CM | POA: Diagnosis not present

## 2021-05-09 DIAGNOSIS — I5032 Chronic diastolic (congestive) heart failure: Secondary | ICD-10-CM | POA: Diagnosis not present

## 2021-05-09 DIAGNOSIS — K869 Disease of pancreas, unspecified: Secondary | ICD-10-CM | POA: Diagnosis not present

## 2021-05-14 ENCOUNTER — Other Ambulatory Visit: Payer: Self-pay

## 2021-05-14 ENCOUNTER — Institutional Professional Consult (permissible substitution) (INDEPENDENT_AMBULATORY_CARE_PROVIDER_SITE_OTHER): Payer: Medicare Other | Admitting: Thoracic Surgery (Cardiothoracic Vascular Surgery)

## 2021-05-14 VITALS — BP 170/83 | HR 80 | Resp 20 | Ht 65.0 in | Wt 132.0 lb

## 2021-05-14 DIAGNOSIS — I2510 Atherosclerotic heart disease of native coronary artery without angina pectoris: Secondary | ICD-10-CM | POA: Diagnosis not present

## 2021-05-14 NOTE — Progress Notes (Signed)
PCP is Celene Squibb, MD Referring Provider is Verta Ellen., NP  Chief Complaint  Patient presents with   Coronary Artery Disease    Surgical consult, Cardiac Cath 03/21/21, ECHO 03/20/21,  CTA C/A/P 03/19/21    HPI: [name] is sent for consultation regarding three-vessel coronary disease.  [name] is an 85 year old woman with numerous medical problems including coronary artery disease, RCA stent in 2010, atrial fibrillation, tachybradycardia syndrome, dual-chamber pacemaker, history of [name] syndrome, neuromuscular disorder, COPD, reflux, hypertension, hyperlipidemia, chronic back pain, anemia, and anxiety.  She is having refractory angina despite maximal medical therapy.  She was in the hospital in August.  She had a catheterization at that time which showed three-vessel disease.  Discussions were had regarding medical therapy versus CABG versus PCI to the RCA.  She opted for medical therapy.  She now wishes to have angioplasty but was sent for cardiac surgical evaluation.   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[place])    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    Essential hypertension    GERD (gastroesophageal reflux disease)    H/O hiatal hernia    Headache(784.0)    HOH (hard of hearing)    Hyperlipidemia    Neuromuscular disorder (HCC)    Tremors   Pericardial effusion    Hemorrhagic    Presence of permanent cardiac pacemaker    Tachycardia-bradycardia syndrome ([place])    s/p Medtronic Adapta L dual chamber device  5/10    Past Surgical History:  Procedure Laterality Date   APPENDECTOMY     CARDIAC CATHETERIZATION  2010   stents x2.   CATARACT EXTRACTION     CHOLECYSTECTOMY     COLONOSCOPY  2011   COLONOSCOPY N/A 10/19/2014   Procedure: COLONOSCOPY;   Surgeon: Rogene Houston, MD;  [facility];  Service: Endoscopy;  Laterality: N/A;  1030   ESOPHAGEAL DILATION N/A 05/02/2020   Procedure: ESOPHAGEAL DILATION;  Surgeon: Rogene Houston, MD;  [facility];  Service: Gastroenterology;  Laterality: N/A;   ESOPHAGOGASTRODUODENOSCOPY N/A 10/26/2014   Procedure: ESOPHAGOGASTRODUODENOSCOPY (EGD);  Surgeon: Rogene Houston, MD;  [facility];  Service: Endoscopy;  Laterality: N/A;  84 - Dr. has lunch and learn   ESOPHAGOGASTRODUODENOSCOPY (EGD) WITH PROPOFOL N/A 05/02/2020   Procedure: ESOPHAGOGASTRODUODENOSCOPY (EGD) WITH PROPOFOL;  Surgeon: Rogene Houston, MD;  [facility];  Service: Gastroenterology;  Laterality: N/A;  250   Esophagogastroduodenoscopy with esophageal dilation  2004, 2006, 2007   GIVENS CAPSULE STUDY N/A 10/31/2014   Procedure: GIVENS CAPSULE STUDY;  Surgeon: Rogene Houston, MD;  [facility];  Service: Endoscopy;  Laterality: N/A;  730 -- pacemaker--needs monitoring--outpatient bed   INSERT / REPLACE / REMOVE PACEMAKER  2010   LEFT HEART CATH AND CORONARY ANGIOGRAPHY N/A 03/21/2021   Procedure: LEFT HEART CATH AND CORONARY ANGIOGRAPHY;  Surgeon: Belva Crome, MD;  Location: [place] CV LAB;  Service: Cardiovascular;  Laterality: N/A;   LEFT HEART CATHETERIZATION WITH CORONARY ANGIOGRAM N/A 11/17/2011   Procedure: LEFT HEART CATHETERIZATION WITH CORONARY ANGIOGRAM;  Surgeon: Sherren Mocha, MD;  Location: [facility] CATH LAB;  Service: Cardiovascular;  Laterality: N/A;   LEFT HEART CATHETERIZATION  WITH CORONARY ANGIOGRAM N/A 09/26/2014   Procedure: LEFT HEART CATHETERIZATION WITH CORONARY ANGIOGRAM;  Surgeon: Leonie Man, MD;  Location: [facility] CATH LAB;  Service: Cardiovascular;  Laterality: N/A;   Right rotator cuff repair     SHOULDER ACROMIOPLASTY Right 05/30/2015   Procedure: RIGHT SHOULDER ACROMIOPLASTY;  Surgeon: Carole Civil, MD;  Location: [facility];  Service: Orthopedics;   Laterality: Right;   SHOULDER OPEN ROTATOR CUFF REPAIR Right 05/30/2015   Procedure: OPEN ROTATOR CUFF REPAIR RIGHT SHOULDER;  Surgeon: Carole Civil, MD;  Location: [facility];  Service: Orthopedics;  Laterality: Right;   SHOULDER OPEN ROTATOR CUFF REPAIR Left 10/22/2016   Procedure: ROTATOR CUFF REPAIR SHOULDER OPEN;  Surgeon: Carole Civil, MD;  Location: [facility];  Service: Orthopedics;  Laterality: Left;   Subxiphoid pericardial window  11/10   VAGINAL HYSTERECTOMY      Family History  Problem Relation Age of Onset   Cancer Mother        Colon    Coronary artery disease Sister    Coronary artery disease Brother    Arthritis Other    Lung disease Other    Asthma Other     Social History Social History   Tobacco Use   Smoking status: Former    Packs/day: 2.00    Years: 10.00    Pack years: 20.00    Types: Cigarettes    Quit date: 05/23/1989    Years since quitting: 31.9   Smokeless tobacco: Never  Vaping Use   Vaping Use: Never used  Substance Use Topics   Alcohol use: No    Alcohol/week: 0.0 standard drinks   Drug use: No    Current Outpatient Medications  Medication Sig Dispense Refill   ALPRAZolam (XANAX) 1 MG tablet Take 1 mg by mouth 3 (three) times daily as needed.     amiodarone (PACERONE) 200 MG tablet Take 200 mg Daily Monday - Saturday None on Sunday 90 tablet 3   amLODipine (NORVASC) 10 MG tablet Take 1 tablet (10 mg total) by mouth daily. 30 tablet 0   aspirin EC 81 MG tablet Take 1 tablet (81 mg total) by mouth daily. Swallow whole. 90 tablet 3   atorvastatin (LIPITOR) 80 MG tablet Take 1 tablet (80 mg total) by mouth daily. 30 tablet 0   clopidogrel (PLAVIX) 75 MG tablet Take 1 tablet (75 mg total) by mouth daily. 30 tablet 0   Cyanocobalamin (VITAMIN B-12) 5000 MCG SUBL Take 5,000 mcg by mouth daily.     folic acid (FOLVITE) 537 MCG tablet Take 400 mcg by mouth daily.     furosemide (LASIX) 40 MG tablet Take 1 tablet (40 mg total) by mouth daily  as needed for fluid or edema. 30 tablet 0   isosorbide mononitrate (IMDUR) 30 MG 24 hr tablet Take 3 tablets (90 mg total) by mouth daily. 90 tablet 3   JARDIANCE 10 MG TABS tablet Take 10 mg by mouth daily.     metoprolol succinate (TOPROL-XL) 25 MG 24 hr tablet Take 3 tablets (75 mg total) by mouth in the morning. & 50 mg in the evening 450 tablet 3   Multiple Vitamin (MULTIVITAMIN WITH MINERALS) TABS tablet Take 1 tablet by mouth daily.      nitroGLYCERIN (NITROSTAT) 0.4 MG SL tablet Place 1 tablet (0.4 mg total) under the tongue every 5 (five) minutes x 3 doses as needed for chest pain. 30 tablet 12   ondansetron (ZOFRAN) 4 MG tablet Take 4  mg by mouth every 6 (six) hours as needed for nausea or vomiting.      pantoprazole (PROTONIX) 40 MG tablet TAKE (1) TABLET BY MOUTH ONCE DAILY. 90 tablet 3   potassium chloride SA (K-DUR,KLOR-CON) 20 MEQ tablet Take 20 mEq by mouth See admin instructions. Takes 1 tablet (20 meq) by mouth daily (scheduled) & a 2nd tablet only if has to take a 2nd dose of Lasix     primidone (MYSOLINE) 50 MG tablet Take 50 mg by mouth at bedtime.     PROAIR HFA 108 (90 Base) MCG/ACT inhaler Inhale 1 puff into the lungs every 4 (four) hours as needed for wheezing or shortness of breath.      promethazine (PHENERGAN) 6.25 MG/5ML syrup Take by mouth every 6 (six) hours as needed for nausea or vomiting.     ranolazine (RANEXA) 500 MG 12 hr tablet Take 1 tablet (500 mg total) by mouth 2 (two) times daily. 60 tablet 11   senna-docusate (SENOKOT-S) 8.6-50 MG tablet Take 1 tablet by mouth at bedtime. 30 tablet 0   VITAMIN D, CHOLECALCIFEROL, PO Take 1 tablet by mouth daily.     No current facility-administered medications for this visit.    Allergies  Allergen Reactions   Amitriptyline Hcl Other (See Comments)    Caused "jaws to twist and lock"   Sulfonamide Derivatives Other (See Comments)    UNKNOWN REACTION    Review of Systems  Constitutional:  Positive for activity  change and fatigue.  HENT:  Positive for dental problem, hearing loss and trouble swallowing.   Respiratory:  Positive for cough, shortness of breath and wheezing.   Cardiovascular:  Positive for chest pain.  Genitourinary:  Negative for difficulty urinating and dysuria.  Musculoskeletal:  Positive for arthralgias and joint swelling.  Neurological:  Positive for dizziness, tremors and headaches. Negative for syncope.  Psychiatric/Behavioral:  The patient is nervous/anxious.    BP (!) 170/83   Pulse 80   Resp 20   Ht 5\' 5"  (1.651 m)   Wt 132 lb (59.9 kg)   SpO2 96% Comment: RA  BMI 21.97 kg/m  Physical Exam Vitals reviewed.  Constitutional:      Comments: Frail  HENT:     Head: Normocephalic and atraumatic.     Ears:     Comments: Hard of hearing Eyes:     General: No scleral icterus. Neck:     Vascular: No carotid bruit.  Cardiovascular:     Rate and Rhythm: Tachycardia present. Rhythm irregular.     Heart sounds: No murmur heard.    Comments: No palpable DP or PT pulse Pulmonary:     Effort: Pulmonary effort is normal. No respiratory distress.     Breath sounds: Normal breath sounds.  Abdominal:     Palpations: Abdomen is soft.  Musculoskeletal:     Cervical back: Neck supple.  Skin:    General: Skin is warm and dry.  Neurological:     General: No focal deficit present.     Mental Status: She is alert and oriented to person, place, and time.     Gait: Gait abnormal.     Comments: Resting tremor    Diagnostic Tests: I personally reviewed the catheterization images.  She has three-vessel disease.  Impression: [name] is an 86 year old woman with numerous medical problems including coronary artery disease, RCA stent in 2010, atrial fibrillation, tachybradycardia syndrome, dual-chamber pacemaker, history of [name] syndrome, neuromuscular disorder, COPD, reflux, hypertension, hyperlipidemia, chronic back  pain, anemia, and anxiety.    She recently was  hospitalized for chest pain.  She was found to have severe three-vessel disease.  She did not want to have surgery.  She opted for medical management.  Unfortunately, she is having refractory angina despite maximal medical therapy.  Although her anatomy is best suited for coronary bypass grafting, she is a poor candidate for the surgery.  I do not think she would have a reasonable chance of a good outcome.  Even though high risk, I think angioplasty of the right coronary is her best option.  Plan: She will follow-up with Dr. Domenic Polite and also has follow-up with Dr. Lovena Le coming up soon.  I spent over 30 minutes in review of records, images, and in consultation with Mrs. Rape today. Melrose Nakayama, MD [facility] [phone]

## 2021-05-15 ENCOUNTER — Ambulatory Visit (HOSPITAL_COMMUNITY)
Admission: RE | Admit: 2021-05-15 | Discharge: 2021-05-15 | Disposition: A | Discharge status: Home / Self Care | Payer: Medicare Other | Source: Ambulatory Visit | Attending: Family Medicine | Admitting: Family Medicine

## 2021-05-15 DIAGNOSIS — R778 Other specified abnormalities of plasma proteins: Secondary | ICD-10-CM | POA: Diagnosis not present

## 2021-05-15 DIAGNOSIS — I2510 Atherosclerotic heart disease of native coronary artery without angina pectoris: Secondary | ICD-10-CM | POA: Diagnosis not present

## 2021-05-15 DIAGNOSIS — K8689 Other specified diseases of pancreas: Secondary | ICD-10-CM | POA: Diagnosis not present

## 2021-05-15 DIAGNOSIS — R9431 Abnormal electrocardiogram [ECG] [EKG]: Secondary | ICD-10-CM | POA: Diagnosis not present

## 2021-05-15 DIAGNOSIS — I160 Hypertensive urgency: Secondary | ICD-10-CM | POA: Diagnosis not present

## 2021-05-15 DIAGNOSIS — I214 Non-ST elevation (NSTEMI) myocardial infarction: Secondary | ICD-10-CM | POA: Diagnosis not present

## 2021-05-15 DIAGNOSIS — R627 Adult failure to thrive: Secondary | ICD-10-CM | POA: Diagnosis not present

## 2021-05-15 DIAGNOSIS — I480 Paroxysmal atrial fibrillation: Secondary | ICD-10-CM | POA: Diagnosis not present

## 2021-05-15 DIAGNOSIS — I169 Hypertensive crisis, unspecified: Secondary | ICD-10-CM | POA: Diagnosis not present

## 2021-05-15 DIAGNOSIS — Z9049 Acquired absence of other specified parts of digestive tract: Secondary | ICD-10-CM | POA: Diagnosis not present

## 2021-05-15 DIAGNOSIS — I5032 Chronic diastolic (congestive) heart failure: Secondary | ICD-10-CM | POA: Diagnosis not present

## 2021-05-15 DIAGNOSIS — K869 Disease of pancreas, unspecified: Secondary | ICD-10-CM | POA: Diagnosis not present

## 2021-05-16 ENCOUNTER — Telehealth: Payer: Self-pay | Admitting: *Deleted

## 2021-05-16 ENCOUNTER — Other Ambulatory Visit: Payer: Medicare Other | Admitting: *Deleted

## 2021-05-16 ENCOUNTER — Ambulatory Visit (INDEPENDENT_AMBULATORY_CARE_PROVIDER_SITE_OTHER): Payer: Medicare Other

## 2021-05-16 DIAGNOSIS — I495 Sick sinus syndrome: Secondary | ICD-10-CM | POA: Diagnosis not present

## 2021-05-16 DIAGNOSIS — M542 Cervicalgia: Secondary | ICD-10-CM | POA: Insufficient documentation

## 2021-05-16 LAB — CUP PACEART REMOTE DEVICE CHECK
Battery Impedance: 2449 Ohm
Battery Remaining Longevity: 29 mo
Battery Voltage: 2.74 V
Brady Statistic AP VP Percent: 8 %
Brady Statistic AP VS Percent: 65 %
Brady Statistic AS VP Percent: 0 %
Brady Statistic AS VS Percent: 27 %
Date Time Interrogation Session: 20221013095655
Implantable Lead Implant Date: 20100526
Implantable Lead Implant Date: 20100526
Implantable Lead Location: 753859
Implantable Lead Location: 753860
Implantable Lead Model: 5076
Implantable Lead Model: 5076
Implantable Pulse Generator Implant Date: 20100526
Lead Channel Impedance Value: 453 Ohm
Lead Channel Impedance Value: 517 Ohm
Lead Channel Pacing Threshold Amplitude: 0.625 V
Lead Channel Pacing Threshold Amplitude: 1.125 V
Lead Channel Pacing Threshold Pulse Width: 0.4 ms
Lead Channel Pacing Threshold Pulse Width: 0.4 ms
Lead Channel Setting Pacing Amplitude: 2 V
Lead Channel Setting Pacing Amplitude: 2.5 V
Lead Channel Setting Pacing Pulse Width: 0.4 ms
Lead Channel Setting Sensing Sensitivity: 2.8 mV

## 2021-05-16 NOTE — Patient Outreach (Signed)
[facility]) Care Management  05/16/2021  JESSICE MADILL 1936-04-27 [id number]   Care Coordination  RN received a call from the pt's daughter [name] requested RN to contact the pt directly. She did not indicate specifically what the pt needed but it was not urgent or emergent.  RN returned a call to the pt who indicated she visited the surgeon's office on yesterday to discuss possible options for a cardiac problem she is experiencing. States her niece was with her but she did not fully understanding everything that was discuss. Pt has requested this RN case manager to contact the provider's office (Dr. Roxan Hockey [phone]) and request the the provider's office to call her with details concerning the visit on yesterday. Pt declined assistance from her niece (April) or her daughter [name]). Pt feels she has been left out of the interventions discussed on yesterday and was very disappointed. Pt states she has not been able to sleep worried about what was discussed that she does not fully understand.   RN will call and request the provider's RN or representative to contact her directly and reiterate on the information that was discussed on yesterday. Pt very grateful with no other needs or request at this time.  RN reminded pt that RN will follow up with her in a few weeks for ongoing care management services and inquired on interventions concerning today's call. Pt very appreciative and grateful for the assistance.  [name], RN Care Management Coordinator [place] Office [phone]

## 2021-05-16 NOTE — Telephone Encounter (Signed)
Received call from Lattie Haw, patient's community outreach nurse, regarding patient's recent appt with Dr. Roxan Hockey. Per RN, patient requesting more information about appointment without the bias of her granddaughter, April. Patient contacted to further discuss her consultation. Per patient, she is hard at hearing and relies on family to interpret for her. Patient states she did not receive any information from April regarding her consultation. Advised patient that per Dr. Leonarda Salon note on 10/11 patient was to be seen by her Cardiologist Dr. Domenic Polite to further discuss angioplasty. Patient thankful for information and verbalizes understanding.

## 2021-05-19 NOTE — Progress Notes (Signed)
Cardiology Office Note  Date: 05/20/2021   ID: AVERLY ERICSON, DOB 25-Mar-1936, MRN [id number]  PCP:  Celene Squibb, MD  Cardiologist:  Rozann Lesches, MD Electrophysiologist:  [name], MD   Chief Complaint: Cardiac follow-up  History of Present Illness: [name] is a 85 y.o. female with a history of atrial fibrillation, COPD, CAD, HTN, GERD, tachybradycardia syndrome with dual-chamber pacemaker, HLD, neuromuscular disorder  She was last seen by Katrina Kicks, NP 04/12/2021.  She had a previous NSTEMI in August 2022.  Recommendations were for medical therapy versus CABG versus elevated risk RCA PCI.  Patient opted for medical therapy.  Plavix was added and to be continued for 1 year.  Blood pressure was elevated on admission with amlodipine added.  BiDil changed to long-acting Imdur.  Hydralazine was added.  She was continuing on beta-blocker and ARB.  She declined anticoagulation for atrial fibrillation.  Tachybradycardia syndrome with PPM.  She was on amiodarone.  Later in August on the 27th she was readmitted for chest pain with associated hypotension.  She was COVID-positive.  Both her ARB and hydralazine were stopped.  Her Imdur was increased to 90 mg secondary to chest pain.  Here for continued chest pain.  She saw Dr. Roxan Hockey on 05/14/2021 for refractory angina despite maximal medical therapy.  She was in the hospital in August and had a catheterization which showed three-vessel disease.  There were discussions regarding medical therapy versus CABG versus PCI to RCA.  She opted for medical therapy.  She wished to have an angioplasty but was sent for cardiac surgical evaluation.  Dr. Roxan Hockey mentioned although her anatomy was best suited for CABG she was a poor candidate for surgery.  He mentioned angioplasty of RCA was the best option.  Plan was to follow-up with Dr. Domenic Polite and Dr. Lovena Le.  She wishes to proceed with RCA intervention.  She continues to have chest pain.   Blood pressure is elevated today.  Daughter states blood pressure is usually better at home.  We again discussed the risks and benefits of cardiac catheterization/PCI to RCA.  She understands and wishes to proceed.  EKG today shows atrial flutter with variable AV block with rate of 82.  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   CATARACT EXTRACTION     CHOLECYSTECTOMY     COLONOSCOPY  2011   COLONOSCOPY N/A 10/19/2014   Procedure: COLONOSCOPY;  Surgeon: Rogene Houston, MD;  [facility]: [facility];  Service: Endoscopy;  Laterality: N/A;  1030   ESOPHAGEAL DILATION N/A 05/02/2020   Procedure:  ESOPHAGEAL DILATION;  Surgeon: Rogene Houston, MD;  [facility]: [facility];  Service: Gastroenterology;  Laterality: N/A;   ESOPHAGOGASTRODUODENOSCOPY N/A 10/26/2014   Procedure: ESOPHAGOGASTRODUODENOSCOPY (EGD);  Surgeon: Rogene Houston, MD;  [facility];  Service: Endoscopy;  Laterality: N/A;  3 - Dr. has lunch and learn   ESOPHAGOGASTRODUODENOSCOPY (EGD) WITH PROPOFOL N/A 05/02/2020   Procedure: ESOPHAGOGASTRODUODENOSCOPY (EGD) WITH PROPOFOL;  Surgeon: Rogene Houston, MD;  [facility];  Service: Gastroenterology;  Laterality: N/A;   250   Esophagogastroduodenoscopy with esophageal dilation  2004, 2006, 2007   GIVENS CAPSULE STUDY N/A 10/31/2014   Procedure: GIVENS CAPSULE STUDY;  Surgeon: Rogene Houston, MD;  [facility]: [facility];  Service: Endoscopy;  Laterality: N/A;  730 -- pacemaker--needs monitoring--outpatient bed   INSERT / REPLACE / REMOVE PACEMAKER  2010   LEFT HEART CATH AND CORONARY ANGIOGRAPHY N/A 03/21/2021   Procedure: LEFT HEART CATH AND CORONARY ANGIOGRAPHY;  Surgeon: Belva Crome, MD;  Location: [facility];  Service: Cardiovascular;  Laterality: N/A;   LEFT HEART CATHETERIZATION WITH CORONARY ANGIOGRAM N/A 11/17/2011   Procedure: LEFT HEART CATHETERIZATION WITH CORONARY ANGIOGRAM;  Surgeon: Sherren Mocha, MD;  Location: [facility] CATH LAB;  Service: Cardiovascular;  Laterality: N/A;   LEFT HEART CATHETERIZATION WITH CORONARY ANGIOGRAM N/A 09/26/2014   Procedure: LEFT HEART CATHETERIZATION WITH CORONARY ANGIOGRAM;  Surgeon: Leonie Man, MD;  Location: [facility] CATH LAB;  Service: Cardiovascular;  Laterality: N/A;   Right rotator cuff repair     SHOULDER ACROMIOPLASTY Right 05/30/2015   Procedure: RIGHT SHOULDER ACROMIOPLASTY;  Surgeon: Carole Civil, MD;  Location: [facility];  Service: Orthopedics;  Laterality: Right;   SHOULDER OPEN ROTATOR CUFF REPAIR Right 05/30/2015   Procedure: OPEN ROTATOR CUFF REPAIR RIGHT SHOULDER;  Surgeon: Carole Civil, MD;  Location: [facility];  Service: Orthopedics;  Laterality: Right;   SHOULDER OPEN ROTATOR CUFF REPAIR Left 10/22/2016   Procedure: ROTATOR CUFF REPAIR SHOULDER OPEN;  Surgeon: Carole Civil, MD;  Location: [facility];  Service: Orthopedics;  Laterality: Left;   Subxiphoid pericardial window  11/10   VAGINAL HYSTERECTOMY      Current Outpatient Medications  Medication Sig Dispense Refill   ALPRAZolam (XANAX) 1 MG tablet Take 1 mg by mouth 3 (three) times daily as needed.     amiodarone (PACERONE) 200 MG tablet Take 200 mg Daily Monday - Saturday None on  Sunday 90 tablet 3   amLODipine (NORVASC) 10 MG tablet Take 1 tablet (10 mg total) by mouth daily. 30 tablet 0   aspirin EC 81 MG tablet Take 1 tablet (81 mg total) by mouth daily. Swallow whole. 90 tablet 3   atorvastatin (LIPITOR) 80 MG tablet Take 1 tablet (80 mg total) by mouth daily. 30 tablet 0   clopidogrel (PLAVIX) 75 MG tablet Take 1 tablet (75 mg total) by mouth daily. 30 tablet 0   Cyanocobalamin (VITAMIN B-12) 5000 MCG SUBL Take 5,000 mcg by mouth daily.     folic acid (FOLVITE) 161 MCG tablet Take 400 mcg by mouth daily.     furosemide (LASIX) 40 MG tablet Take 1 tablet (40 mg total) by mouth daily as needed for fluid or edema. 30 tablet 0   isosorbide mononitrate (IMDUR) 30 MG 24 hr tablet Take 120 mg by mouth daily.     JARDIANCE 10 MG TABS tablet Take 10 mg by mouth daily.     metoprolol succinate (TOPROL-XL) 25 MG 24 hr tablet Take  3 tablets (75 mg total) by mouth in the morning. & 50 mg in the evening 450 tablet 3   Multiple Vitamin (MULTIVITAMIN WITH MINERALS) TABS tablet Take 1 tablet by mouth daily.      nitroGLYCERIN (NITROSTAT) 0.4 MG SL tablet Place 1 tablet (0.4 mg total) under the tongue every 5 (five) minutes x 3 doses as needed for chest pain. 30 tablet 12   ondansetron (ZOFRAN) 4 MG tablet Take 4 mg by mouth every 6 (six) hours as needed for nausea or vomiting.      pantoprazole (PROTONIX) 40 MG tablet TAKE (1) TABLET BY MOUTH ONCE DAILY. 90 tablet 3   potassium chloride SA (K-DUR,KLOR-CON) 20 MEQ tablet Take 20 mEq by mouth See admin instructions. Takes 1 tablet (20 meq) by mouth daily (scheduled) & a 2nd tablet only if has to take a 2nd dose of Lasix     primidone (MYSOLINE) 50 MG tablet Take 50 mg by mouth at bedtime.     PROAIR HFA 108 (90 Base) MCG/ACT inhaler Inhale 1 puff into the lungs every 4 (four) hours as needed for wheezing or shortness of breath.      promethazine (PHENERGAN) 6.25 MG/5ML syrup Take by mouth every 6 (six) hours as needed for nausea or  vomiting.     ranolazine (RANEXA) 500 MG 12 hr tablet Take 1 tablet (500 mg total) by mouth 2 (two) times daily. 60 tablet 11   senna-docusate (SENOKOT-S) 8.6-50 MG tablet Take 1 tablet by mouth at bedtime. 30 tablet 0   VITAMIN D, CHOLECALCIFEROL, PO Take 1 tablet by mouth daily.     No current facility-administered medications for this visit.   Allergies:  Amitriptyline hcl and Sulfonamide derivatives   Social History: The patient  reports that she quit smoking about 32 years ago. Her smoking use included cigarettes. She has a 20.00 pack-year smoking history. She has never used smokeless tobacco. She reports that she does not drink alcohol and does not use drugs.   Family History: The patient's family history includes Arthritis in an other family member; Asthma in an other family member; Cancer in her mother; Coronary artery disease in her brother and sister; Lung disease in an other family member.   ROS:  Please see the history of present illness. Otherwise, complete review of systems is positive for none.  All other systems are reviewed and negative.   Physical Exam: VS:  BP (!) 160/80   Pulse 84   Ht 5\' 5"  (1.651 m)   Wt 133 lb (60.3 kg)   SpO2 98%   BMI 22.13 kg/m , BMI Body mass index is 22.13 kg/m.  Wt Readings from Last 3 Encounters:  05/20/21 133 lb (60.3 kg)  05/14/21 132 lb (59.9 kg)  04/23/21 132 lb 9.6 oz (60.1 kg)    General: Patient appears comfortable at rest. Neck: Supple, no elevated JVP or carotid bruits, no thyromegaly. Lungs: Clear to auscultation, nonlabored breathing at rest. Cardiac: Regular rate and rhythm, no S3 or significant systolic murmur, no pericardial rub. Extremities: No pitting edema, distal pulses 2+. Skin: Warm and dry. Musculoskeletal: No kyphosis. Neuropsychiatric: Alert and oriented x3, affect grossly appropriate.  ECG: Atrial flutter with variable AV block rate of 82 05/20/2021  Recent Labwork: 03/19/2021: ALT 20; AST 28 03/20/2021:  Magnesium 2.2 03/30/2021: B Natriuretic Peptide 253.0 03/31/2021: BUN 15; Creatinine, Ser 0.68; Hemoglobin 12.9; Platelets 218; Potassium 3.9; Sodium 135     Component Value Date/Time   CHOL 200 03/20/2021 1605  TRIG 130 03/20/2021 1605   HDL 49 03/20/2021 1605   CHOLHDL 4.1 03/20/2021 1605   VLDL 26 03/20/2021 1605   LDLCALC 125 (H) 03/20/2021 1605    Other Studies Reviewed Today:   LEFT HEART CATH AND CORONARY ANGIOGRAPHY     03/21/2021   Conclusion      Severe calcified diffuse three-vessel coronary artery disease.  Right dominant anatomy.   Nonobstructive left main   Ostial 75% calcified LAD with tandem mid 80% and distal 80% stenoses.   Diffusely diseased circumflex with 75% proximal to mid first obtuse marginal.  Mid to distal circumflex 70% before small second obtuse marginal.   Dominant right coronary with previously placed proximal stent having diffuse 50% ISR, 80 to 90% segmental mid stenosis (probable culprit), and continuation of segmental 60% stenosis to the distal segment where the artery opens up to a 3.0 diameter.  PDA and LV branch are large.   Normal LVEDP.  EF 65%.   RECOMMENDATIONS: Medical therapy versus CABG versus elevated risk RCA PCI.  Consider heart team approach after conversation with the patient and family about level of aggressiveness that they feel is appropriate. Aggressive risk factor modification and anti-ischemic therapy. If RCA PCI, would recommend femoral approach rather than radial for better catheter support during the intervention. Diagnostic Dominance: Right       Echocardiogram 03/20/2021   1. Left ventricular ejection fraction, by estimation, is 55 to 60%. The  left ventricle has normal function. The left ventricle has no regional  wall motion abnormalities. Left ventricular diastolic parameters are  consistent with Grade II diastolic  dysfunction (pseudonormalization). Elevated left ventricular end-diastolic  pressure.   2. Right  ventricular systolic function is normal. The right ventricular  size is normal. There is normal pulmonary artery systolic pressure.   3. Left atrial size was moderately dilated.   4. The mitral valve is degenerative. Mild mitral valve regurgitation. No  evidence of mitral stenosis.   5. There is partial fusion of the left and non-coronary cusps. The aortic  valve is calcified. There is mild calcification of the aortic valve. There  is mild thickening of the aortic valve. Aortic valve regurgitation is  mild. Mild aortic valve sclerosis  is present, with no evidence of aortic valve stenosis. Aortic  regurgitation PHT measures 582 msec. Aortic valve area, by VTI measures  2.49 cm. Aortic valve mean gradient measures 3.5 mmHg. Aortic valve Vmax  measures 1.23 m/s.   6. Aortic dilatation noted. There is mild dilatation of the aortic root,  measuring 37 mm. There is mild dilatation of the ascending aorta,  measuring 38 mm.   7. The inferior vena cava is normal in size with greater than 50%  respiratory variability, suggesting right atrial pressure of 3 mmHg.    CT angio chest/abdomen/pelvis to rule out aortic dissection IMPRESSION: No evidence of thoracoabdominal aortic aneurysm or dissection.   11 mm low-density lesion in the pancreatic head, new. This is poorly evaluated on CT. Follow-up MRI abdomen with/without contrast is suggested in 4 weeks (on an outpatient basis).     Assessment and Plan:  1. Chronic stable angina ([place])   2. CAD in native artery   3. Essential hypertension, benign   4. Mixed hyperlipidemia   5. Paroxysmal atrial fibrillation (HCC)   6. Tachycardia-bradycardia syndrome ([place])   7. Chronic diastolic heart failure ([place])     1. Chronic stable angina ([place]) Continues with anginal symptoms.  She is using some occasional sublingual nitroglycerin  for relief.  She wants to proceed with RCA intervention as discussed with Dr. Roxan Hockey at her recent visit.  She has  diffuse three-vessel disease.  Continue Ranexa 500 mg p.o. twice daily,.  Increase Imdur to 120 mg daily..  Continue sublingual nitroglycerin as needed.  Continue aspirin 81 mg daily.  Discussed with her and her daughter I would forward chart to Dr. Domenic Polite and he would let us know about proceeding with cardiac catheterization  2. CAD in native artery Recent cardiac catheterization on March 21, 2021 with severe calcified diffuse three-vessel CAD.  See cath report above.  Recommendations were medical therapy versus CABG versus elevated risk RCA PCI.  Consider heart team approach after conversation with patient and family about level of aggressiveness they felt appropriate.  Patient initially opted for medical therapy.  They are choosing to opt for PCI to RCA after recent discussion with Dr. Roxan Hockey.  We discussed risk and benefits of cardiac catheterization and PCI.  She has had 2 previous interventions in the past.  She verbalizes understanding and wishes to proceed.    Advised to continue current medications including aspirin 81 mg daily, Ranexa 500 mg p.o. twice daily, increase Imdur to 120 mg daily sublingual nitroglycerin as needed, Plavix 75 mg daily, Jardiance 10 mg daily, Toprol-XL 75 mg daily.  3. Essential hypertension, benign Blood pressure is elevated today at 160/88.  Daughter states blood pressures are much better at home..  Continue amlodipine 10 mg daily, Lasix 40 mg daily, Toprol-XL 75 mg daily  4. Mixed hyperlipidemia Continue atorvastatin 80 mg p.o. daily.  5. Paroxysmal atrial fibrillation (HCC) Continue amiodarone 200 mg daily.  Continue Toprol-XL 25 mg daily.  Rate control today at 71.  Not on [place].  On DAPT therapy with aspirin and Plavix.  6. Tachycardia-bradycardia syndrome ([place]) Medtronic pacemaker in place.  Recent remote device check normal device function, PAF, not a candidate for [facility] Dr. Lovena Le.  7. Chronic diastolic heart failure ([place]) Most recent echocardiogram  03/20/2021 with EF of 55 to 60%.  No WMA's, G2 DD.  LA moderately dilated, mild MR.  Mild aortic regurgitation, mild dilation of aortic root measuring 37 mm.  Mild dilation of ascending aorta measuring 38 mm.  Continue Toprol-XL 75 mg daily, continue Lasix 40 mg daily.  Continue potassium supplementation continue Jardiance 10 mg daily.    Medication Adjustments/Labs and Tests Ordered: Current medicines are reviewed at length with the patient today.  Concerns regarding medicines are outlined above.   Disposition: Follow-up with Dr. Domenic Polite or APP after PCI  Signed, Levell July, NP 05/20/2021 10:48 AM    [facility] [address], [place], [place] [address] Phone: [phone]; Fax: [phone]

## 2021-05-19 NOTE — H&P (View-Only) (Signed)
Cardiology Office Note  Date: 05/20/2021   ID: [name], DOB April 22, 1936, MRN [id number]  PCP:  Celene Squibb, MD  Cardiologist:  Rozann Lesches, MD Electrophysiologist:  [name], MD   Chief Complaint: Cardiac follow-up  History of Present Illness: [name] is a 85 y.o. female with a history of atrial fibrillation, COPD, CAD, HTN, GERD, tachybradycardia syndrome with dual-chamber pacemaker, HLD, neuromuscular disorder  She was last seen by Cecilie Kicks, NP 04/12/2021.  She had a previous NSTEMI in August 2022.  Recommendations were for medical therapy versus CABG versus elevated risk RCA PCI.  Patient opted for medical therapy.  Plavix was added and to be continued for 1 year.  Blood pressure was elevated on admission with amlodipine added.  BiDil changed to long-acting Imdur.  Hydralazine was added.  She was continuing on beta-blocker and ARB.  She declined anticoagulation for atrial fibrillation.  Tachybradycardia syndrome with PPM.  She was on amiodarone.  Later in August on the 27th she was readmitted for chest pain with associated hypotension.  She was COVID-positive.  Both her ARB and hydralazine were stopped.  Her Imdur was increased to 90 mg secondary to chest pain.  Here for continued chest pain.  She saw Dr. Roxan Hockey on 05/14/2021 for refractory angina despite maximal medical therapy.  She was in the hospital in August and had a catheterization which showed three-vessel disease.  There were discussions regarding medical therapy versus CABG versus PCI to RCA.  She opted for medical therapy.  She wished to have an angioplasty but was sent for cardiac surgical evaluation.  Dr. Roxan Hockey mentioned although her anatomy was best suited for CABG she was a poor candidate for surgery.  He mentioned angioplasty of RCA was the best option.  Plan was to follow-up with Dr. Domenic Polite and Dr. Lovena Le.  She wishes to proceed with RCA intervention.  She continues to have chest pain.   Blood pressure is elevated today.  Daughter states blood pressure is usually better at home.  We again discussed the risks and benefits of cardiac catheterization/PCI to RCA.  She understands and wishes to proceed.  EKG today shows atrial flutter with variable AV block with rate of 82.  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   CATARACT EXTRACTION     CHOLECYSTECTOMY     COLONOSCOPY  2011   COLONOSCOPY N/A 10/19/2014   Procedure: COLONOSCOPY;  Surgeon: Rogene Houston, MD;  [facility];  Service: Endoscopy;  Laterality: N/A;  1030   ESOPHAGEAL DILATION N/A 05/02/2020   Procedure:  ESOPHAGEAL DILATION;  Surgeon: Rogene Houston, MD;  [facility];  Service: Gastroenterology;  Laterality: N/A;   ESOPHAGOGASTRODUODENOSCOPY N/A 10/26/2014   Procedure: ESOPHAGOGASTRODUODENOSCOPY (EGD);  Surgeon: Rogene Houston, MD;  [facility];  Service: Endoscopy;  Laterality: N/A;  33 - Dr. has lunch and learn   ESOPHAGOGASTRODUODENOSCOPY (EGD) WITH PROPOFOL N/A 05/02/2020   Procedure: ESOPHAGOGASTRODUODENOSCOPY (EGD) WITH PROPOFOL;  Surgeon: Rogene Houston, MD;  [facility];  Service: Gastroenterology;  Laterality: N/A;   250   Esophagogastroduodenoscopy with esophageal dilation  2004, 2006, 2007   GIVENS CAPSULE STUDY N/A 10/31/2014   Procedure: GIVENS CAPSULE STUDY;  Surgeon: Rogene Houston, MD;  [facility];  Service: Endoscopy;  Laterality: N/A;  730 -- pacemaker--needs monitoring--outpatient bed   INSERT / REPLACE / REMOVE PACEMAKER  2010   LEFT HEART CATH AND CORONARY ANGIOGRAPHY N/A 03/21/2021   Procedure: LEFT HEART CATH AND CORONARY ANGIOGRAPHY;  Surgeon: Belva Crome, MD;  Location: [facility];  Service: Cardiovascular;  Laterality: N/A;   LEFT HEART CATHETERIZATION WITH CORONARY ANGIOGRAM N/A 11/17/2011   Procedure: LEFT HEART CATHETERIZATION WITH CORONARY ANGIOGRAM;  Surgeon: Sherren Mocha, MD;  Location: [facility] CATH LAB;  Service: Cardiovascular;  Laterality: N/A;   LEFT HEART CATHETERIZATION WITH CORONARY ANGIOGRAM N/A 09/26/2014   Procedure: LEFT HEART CATHETERIZATION WITH CORONARY ANGIOGRAM;  Surgeon: Leonie Man, MD;  Location: [facility] CATH LAB;  Service: Cardiovascular;  Laterality: N/A;   Right rotator cuff repair     SHOULDER ACROMIOPLASTY Right 05/30/2015   Procedure: RIGHT SHOULDER ACROMIOPLASTY;  Surgeon: Carole Civil, MD;  Location: [facility];  Service: Orthopedics;  Laterality: Right;   SHOULDER OPEN ROTATOR CUFF REPAIR Right 05/30/2015   Procedure: OPEN ROTATOR CUFF REPAIR RIGHT SHOULDER;  Surgeon: Carole Civil, MD;  Location: [facility];  Service: Orthopedics;  Laterality: Right;   SHOULDER OPEN ROTATOR CUFF REPAIR Left 10/22/2016   Procedure: ROTATOR CUFF REPAIR SHOULDER OPEN;  Surgeon: Carole Civil, MD;  Location: [facility];  Service: Orthopedics;  Laterality: Left;   Subxiphoid pericardial window  11/10   VAGINAL HYSTERECTOMY      Current Outpatient Medications  Medication Sig Dispense Refill   ALPRAZolam (XANAX) 1 MG tablet Take 1 mg by mouth 3 (three) times daily as needed.     amiodarone (PACERONE) 200 MG tablet Take 200 mg Daily Monday - Saturday None on  Sunday 90 tablet 3   amLODipine (NORVASC) 10 MG tablet Take 1 tablet (10 mg total) by mouth daily. 30 tablet 0   aspirin EC 81 MG tablet Take 1 tablet (81 mg total) by mouth daily. Swallow whole. 90 tablet 3   atorvastatin (LIPITOR) 80 MG tablet Take 1 tablet (80 mg total) by mouth daily. 30 tablet 0   clopidogrel (PLAVIX) 75 MG tablet Take 1 tablet (75 mg total) by mouth daily. 30 tablet 0   Cyanocobalamin (VITAMIN B-12) 5000 MCG SUBL Take 5,000 mcg by mouth daily.     folic acid (FOLVITE) 712 MCG tablet Take 400 mcg by mouth daily.     furosemide (LASIX) 40 MG tablet Take 1 tablet (40 mg total) by mouth daily as needed for fluid or edema. 30 tablet 0   isosorbide mononitrate (IMDUR) 30 MG 24 hr tablet Take 120 mg by mouth daily.     JARDIANCE 10 MG TABS tablet Take 10 mg by mouth daily.     metoprolol succinate (TOPROL-XL) 25 MG 24 hr tablet Take  3 tablets (75 mg total) by mouth in the morning. & 50 mg in the evening 450 tablet 3   Multiple Vitamin (MULTIVITAMIN WITH MINERALS) TABS tablet Take 1 tablet by mouth daily.      nitroGLYCERIN (NITROSTAT) 0.4 MG SL tablet Place 1 tablet (0.4 mg total) under the tongue every 5 (five) minutes x 3 doses as needed for chest pain. 30 tablet 12   ondansetron (ZOFRAN) 4 MG tablet Take 4 mg by mouth every 6 (six) hours as needed for nausea or vomiting.      pantoprazole (PROTONIX) 40 MG tablet TAKE (1) TABLET BY MOUTH ONCE DAILY. 90 tablet 3   potassium chloride SA (K-DUR,KLOR-CON) 20 MEQ tablet Take 20 mEq by mouth See admin instructions. Takes 1 tablet (20 meq) by mouth daily (scheduled) & a 2nd tablet only if has to take a 2nd dose of Lasix     primidone (MYSOLINE) 50 MG tablet Take 50 mg by mouth at bedtime.     PROAIR HFA 108 (90 Base) MCG/ACT inhaler Inhale 1 puff into the lungs every 4 (four) hours as needed for wheezing or shortness of breath.      promethazine (PHENERGAN) 6.25 MG/5ML syrup Take by mouth every 6 (six) hours as needed for nausea or  vomiting.     ranolazine (RANEXA) 500 MG 12 hr tablet Take 1 tablet (500 mg total) by mouth 2 (two) times daily. 60 tablet 11   senna-docusate (SENOKOT-S) 8.6-50 MG tablet Take 1 tablet by mouth at bedtime. 30 tablet 0   VITAMIN D, CHOLECALCIFEROL, PO Take 1 tablet by mouth daily.     No current facility-administered medications for this visit.   Allergies:  Amitriptyline hcl and Sulfonamide derivatives   Social History: The patient  reports that she quit smoking about 32 years ago. Her smoking use included cigarettes. She has a 20.00 pack-year smoking history. She has never used smokeless tobacco. She reports that she does not drink alcohol and does not use drugs.   Family History: The patient's family history includes Arthritis in an other family member; Asthma in an other family member; Cancer in her mother; Coronary artery disease in her brother and sister; Lung disease in an other family member.   ROS:  Please see the history of present illness. Otherwise, complete review of systems is positive for none.  All other systems are reviewed and negative.   Physical Exam: VS:  BP (!) 160/80   Pulse 84   Ht 5\' 5"  (1.651 m)   Wt 133 lb (60.3 kg)   SpO2 98%   BMI 22.13 kg/m , BMI Body mass index is 22.13 kg/m.  Wt Readings from Last 3 Encounters:  05/20/21 133 lb (60.3 kg)  05/14/21 132 lb (59.9 kg)  04/23/21 132 lb 9.6 oz (60.1 kg)    General: Patient appears comfortable at rest. Neck: Supple, no elevated JVP or carotid bruits, no thyromegaly. Lungs: Clear to auscultation, nonlabored breathing at rest. Cardiac: Regular rate and rhythm, no S3 or significant systolic murmur, no pericardial rub. Extremities: No pitting edema, distal pulses 2+. Skin: Warm and dry. Musculoskeletal: No kyphosis. Neuropsychiatric: Alert and oriented x3, affect grossly appropriate.  ECG: Atrial flutter with variable AV block rate of 82 05/20/2021  Recent Labwork: 03/19/2021: ALT 20; AST 28 03/20/2021:  Magnesium 2.2 03/30/2021: B Natriuretic Peptide 253.0 03/31/2021: BUN 15; Creatinine, Ser 0.68; Hemoglobin 12.9; Platelets 218; Potassium 3.9; Sodium 135     Component Value Date/Time   CHOL 200 03/20/2021 1605  TRIG 130 03/20/2021 1605   HDL 49 03/20/2021 1605   CHOLHDL 4.1 03/20/2021 1605   VLDL 26 03/20/2021 1605   LDLCALC 125 (H) 03/20/2021 1605    Other Studies Reviewed Today:   LEFT HEART CATH AND CORONARY ANGIOGRAPHY     03/21/2021   Conclusion      Severe calcified diffuse three-vessel coronary artery disease.  Right dominant anatomy.   Nonobstructive left main   Ostial 75% calcified LAD with tandem mid 80% and distal 80% stenoses.   Diffusely diseased circumflex with 75% proximal to mid first obtuse marginal.  Mid to distal circumflex 70% before small second obtuse marginal.   Dominant right coronary with previously placed proximal stent having diffuse 50% ISR, 80 to 90% segmental mid stenosis (probable culprit), and continuation of segmental 60% stenosis to the distal segment where the artery opens up to a 3.0 diameter.  PDA and LV branch are large.   Normal LVEDP.  EF 65%.   RECOMMENDATIONS: Medical therapy versus CABG versus elevated risk RCA PCI.  Consider heart team approach after conversation with the patient and family about level of aggressiveness that they feel is appropriate. Aggressive risk factor modification and anti-ischemic therapy. If RCA PCI, would recommend femoral approach rather than radial for better catheter support during the intervention. Diagnostic Dominance: Right       Echocardiogram 03/20/2021   1. Left ventricular ejection fraction, by estimation, is 55 to 60%. The  left ventricle has normal function. The left ventricle has no regional  wall motion abnormalities. Left ventricular diastolic parameters are  consistent with Grade II diastolic  dysfunction (pseudonormalization). Elevated left ventricular end-diastolic  pressure.   2. Right  ventricular systolic function is normal. The right ventricular  size is normal. There is normal pulmonary artery systolic pressure.   3. Left atrial size was moderately dilated.   4. The mitral valve is degenerative. Mild mitral valve regurgitation. No  evidence of mitral stenosis.   5. There is partial fusion of the left and non-coronary cusps. The aortic  valve is calcified. There is mild calcification of the aortic valve. There  is mild thickening of the aortic valve. Aortic valve regurgitation is  mild. Mild aortic valve sclerosis  is present, with no evidence of aortic valve stenosis. Aortic  regurgitation PHT measures 582 msec. Aortic valve area, by VTI measures  2.49 cm. Aortic valve mean gradient measures 3.5 mmHg. Aortic valve Vmax  measures 1.23 m/s.   6. Aortic dilatation noted. There is mild dilatation of the aortic root,  measuring 37 mm. There is mild dilatation of the ascending aorta,  measuring 38 mm.   7. The inferior vena cava is normal in size with greater than 50%  respiratory variability, suggesting right atrial pressure of 3 mmHg.    CT angio chest/abdomen/pelvis to rule out aortic dissection IMPRESSION: No evidence of thoracoabdominal aortic aneurysm or dissection.   11 mm low-density lesion in the pancreatic head, new. This is poorly evaluated on CT. Follow-up MRI abdomen with/without contrast is suggested in 4 weeks (on an outpatient basis).     Assessment and Plan:  1. Chronic stable angina ([place])   2. CAD in native artery   3. Essential hypertension, benign   4. Mixed hyperlipidemia   5. Paroxysmal atrial fibrillation (HCC)   6. Tachycardia-bradycardia syndrome ([place])   7. Chronic diastolic heart failure ([place])     1. Chronic stable angina ([place]) Continues with anginal symptoms.  She is using some occasional sublingual nitroglycerin  for relief.  She wants to proceed with RCA intervention as discussed with Dr. Roxan Hockey at her recent visit.  She has  diffuse three-vessel disease.  Continue Ranexa 500 mg p.o. twice daily,.  Increase Imdur to 120 mg daily..  Continue sublingual nitroglycerin as needed.  Continue aspirin 81 mg daily.  Discussed with her and her daughter I would forward chart to Dr. Domenic Polite and he would let us know about proceeding with cardiac catheterization  2. CAD in native artery Recent cardiac catheterization on March 21, 2021 with severe calcified diffuse three-vessel CAD.  See cath report above.  Recommendations were medical therapy versus CABG versus elevated risk RCA PCI.  Consider heart team approach after conversation with patient and family about level of aggressiveness they felt appropriate.  Patient initially opted for medical therapy.  They are choosing to opt for PCI to RCA after recent discussion with Dr. Roxan Hockey.  We discussed risk and benefits of cardiac catheterization and PCI.  She has had 2 previous interventions in the past.  She verbalizes understanding and wishes to proceed.    Advised to continue current medications including aspirin 81 mg daily, Ranexa 500 mg p.o. twice daily, increase Imdur to 120 mg daily sublingual nitroglycerin as needed, Plavix 75 mg daily, Jardiance 10 mg daily, Toprol-XL 75 mg daily.  3. Essential hypertension, benign Blood pressure is elevated today at 160/88.  Daughter states blood pressures are much better at home..  Continue amlodipine 10 mg daily, Lasix 40 mg daily, Toprol-XL 75 mg daily  4. Mixed hyperlipidemia Continue atorvastatin 80 mg p.o. daily.  5. Paroxysmal atrial fibrillation (HCC) Continue amiodarone 200 mg daily.  Continue Toprol-XL 25 mg daily.  Rate control today at 71.  Not on [place].  On DAPT therapy with aspirin and Plavix.  6. Tachycardia-bradycardia syndrome ([place]) Medtronic pacemaker in place.  Recent remote device check normal device function, PAF, not a candidate for [facility] Dr. Lovena Le.  7. Chronic diastolic heart failure ([place]) Most recent echocardiogram  03/20/2021 with EF of 55 to 60%.  No WMA's, G2 DD.  LA moderately dilated, mild MR.  Mild aortic regurgitation, mild dilation of aortic root measuring 37 mm.  Mild dilation of ascending aorta measuring 38 mm.  Continue Toprol-XL 75 mg daily, continue Lasix 40 mg daily.  Continue potassium supplementation continue Jardiance 10 mg daily.    Medication Adjustments/Labs and Tests Ordered: Current medicines are reviewed at length with the patient today.  Concerns regarding medicines are outlined above.   Disposition: Follow-up with Dr. Domenic Polite or APP after PCI  Signed, Levell July, NP 05/20/2021 10:48 AM    [facility] [place], [place], [place] [address] Phone: [phone]; Fax: [phone]

## 2021-05-20 ENCOUNTER — Ambulatory Visit (INDEPENDENT_AMBULATORY_CARE_PROVIDER_SITE_OTHER): Payer: Medicare Other | Admitting: Family Medicine

## 2021-05-20 ENCOUNTER — Telehealth (INDEPENDENT_AMBULATORY_CARE_PROVIDER_SITE_OTHER): Payer: Self-pay | Admitting: *Deleted

## 2021-05-20 ENCOUNTER — Other Ambulatory Visit: Payer: Self-pay

## 2021-05-20 ENCOUNTER — Encounter: Payer: Self-pay | Admitting: Family Medicine

## 2021-05-20 VITALS — BP 160/80 | HR 84 | Ht 65.0 in | Wt 133.0 lb

## 2021-05-20 DIAGNOSIS — I5032 Chronic diastolic (congestive) heart failure: Secondary | ICD-10-CM

## 2021-05-20 DIAGNOSIS — I480 Paroxysmal atrial fibrillation: Secondary | ICD-10-CM | POA: Diagnosis not present

## 2021-05-20 DIAGNOSIS — R778 Other specified abnormalities of plasma proteins: Secondary | ICD-10-CM | POA: Diagnosis not present

## 2021-05-20 DIAGNOSIS — I160 Hypertensive urgency: Secondary | ICD-10-CM | POA: Diagnosis not present

## 2021-05-20 DIAGNOSIS — Z01818 Encounter for other preprocedural examination: Secondary | ICD-10-CM | POA: Diagnosis not present

## 2021-05-20 DIAGNOSIS — E782 Mixed hyperlipidemia: Secondary | ICD-10-CM

## 2021-05-20 DIAGNOSIS — I208 Other forms of angina pectoris: Secondary | ICD-10-CM | POA: Diagnosis not present

## 2021-05-20 DIAGNOSIS — R61 Generalized hyperhidrosis: Secondary | ICD-10-CM

## 2021-05-20 DIAGNOSIS — R627 Adult failure to thrive: Secondary | ICD-10-CM | POA: Diagnosis not present

## 2021-05-20 DIAGNOSIS — K869 Disease of pancreas, unspecified: Secondary | ICD-10-CM | POA: Diagnosis not present

## 2021-05-20 DIAGNOSIS — I214 Non-ST elevation (NSTEMI) myocardial infarction: Secondary | ICD-10-CM | POA: Diagnosis not present

## 2021-05-20 DIAGNOSIS — I495 Sick sinus syndrome: Secondary | ICD-10-CM | POA: Diagnosis not present

## 2021-05-20 DIAGNOSIS — I2510 Atherosclerotic heart disease of native coronary artery without angina pectoris: Secondary | ICD-10-CM | POA: Diagnosis not present

## 2021-05-20 DIAGNOSIS — I169 Hypertensive crisis, unspecified: Secondary | ICD-10-CM | POA: Diagnosis not present

## 2021-05-20 DIAGNOSIS — Z01810 Encounter for preprocedural cardiovascular examination: Secondary | ICD-10-CM

## 2021-05-20 DIAGNOSIS — I10 Essential (primary) hypertension: Secondary | ICD-10-CM

## 2021-05-20 DIAGNOSIS — R9431 Abnormal electrocardiogram [ECG] [EKG]: Secondary | ICD-10-CM | POA: Diagnosis not present

## 2021-05-20 NOTE — Telephone Encounter (Signed)
Pt called and wanted to see if her appt on 11/22 could be moved up due to finding a mass on her pancreas. There is a cancellation for tomorrow and pt was given appt tomorrow at 9:30. Pt states she will come tomorrow at 9:30

## 2021-05-20 NOTE — Patient Instructions (Addendum)
Medication Instructions:  Your physician recommends that you continue on your current medications as directed. Please refer to the Current Medication list given to you today.  Labwork: BMET & CBC today at [facility]  Testing/Procedures: Your physician has requested that you have a cardiac catheterization. Cardiac catheterization is used to diagnose and/or treat various heart conditions. Doctors may recommend this procedure for a number of different reasons. The most common reason is to evaluate chest pain. Chest pain can be a symptom of coronary artery disease (CAD), and cardiac catheterization can show whether plaque is narrowing or blocking your heart's arteries. This procedure is also used to evaluate the valves, as well as measure the blood flow and oxygen levels in different parts of your heart. For further information please visit [email or web address]. Please follow instruction sheet, as given.  Follow-Up: Your physician recommends that you schedule a follow-up appointment in: 1 month  Any Other Special Instructions Will Be Listed Below (If Applicable).  If you need a refill on your cardiac medications before your next appointment, please call your pharmacy.   [place] [id number] Dept: [phone] Loc: [phone]  [name]  05/20/2021  You are scheduled for a Cardiac Catheterization on TBD with Dr. Dellie Catholic  1. Please arrive at the [facility] (Main Entrance A) at [facility]: [address] [place], [place] [address] at TBD (This time is two hours before your procedure to ensure your preparation). Free valet parking service is available.   Special note: Every effort is made to have your procedure done on time. Please understand that emergencies sometimes delay scheduled procedures.  2. Diet: Do not eat solid foods after midnight.  The patient may  have clear liquids until 5am upon the day of the procedure.  3. Labs: You will need to have blood drawn on TBD at [place] do not need to be fasting.  4. Medication instructions in preparation for your procedure:   Do not take furosemide or potassium on the day of your cath.   Contrast Allergy: No  On the morning of your procedure, take your Aspirin 81 mg and any morning medicines NOT listed above.  You may use sips of water.  5. Plan for one night stay--bring personal belongings. 6. Bring a current list of your medications and current insurance cards. 7. You MUST have a responsible person to drive you home. 8. Someone MUST be with you the first 24 hours after you arrive home or your discharge will be delayed. 9. Please wear clothes that are easy to get on and off and wear slip-on shoes.  Thank you for allowing [place] to care for you!   -- [facility]

## 2021-05-21 ENCOUNTER — Telehealth: Payer: Self-pay | Admitting: *Deleted

## 2021-05-21 ENCOUNTER — Ambulatory Visit (INDEPENDENT_AMBULATORY_CARE_PROVIDER_SITE_OTHER): Payer: Medicare Other | Admitting: Gastroenterology

## 2021-05-21 DIAGNOSIS — I200 Unstable angina: Secondary | ICD-10-CM

## 2021-05-21 NOTE — Telephone Encounter (Signed)
Patient's daughter April informed and verbalized understanding of plan.

## 2021-05-21 NOTE — Telephone Encounter (Signed)
-----   Message from Verta Ellen., NP sent at 05/20/2021  7:41 PM EDT ----- Regarding: PCI to RCA high risk Ok Thanks. I will CC to [facility] and have her schedule an appointment with Dr Tamala Julian.  ----- Message ----- From: Satira Sark, MD Sent: 05/20/2021   7:29 PM EDT To: Belva Crome, MD, Verta Ellen., NP  Thank you Jonni Sanger.  I reviewed your note.  I had already seen Dr. Leonarda Salon recommendations and forwarded my concerns to Dr. Tamala Julian who did her recent cardiac catheterization.  I think the best way to proceed here would be to have her see one of our interventional cardiologists, most likely Dr. Tamala Julian (or someone designated by Dr. Tamala Julian) so that a full discussion can be had with the patient and her daughter regarding risk of PCI, and then if acceptable by all proceed from there.  This is not something that I would simply schedule as an outpatient without involving the interventional team. ----- Message ----- From: Verta Ellen., NP Sent: 05/20/2021  10:50 AM EDT To: Satira Sark, MD  Dr. Domenic Polite.  Dr. Roxan Hockey states that the only viable option for her would be PCI to RCA.  Okay to proceed with scheduling cardiac catheterization?  Thank you

## 2021-05-22 NOTE — Telephone Encounter (Signed)
Dr. Tamala Julian spoke with daughter and she was agreeable to procedure on Tuesday, 10/25.  Called daughter and went over instructions.  She verbalized understanding and was appreciative for call.

## 2021-05-23 ENCOUNTER — Telehealth: Payer: Self-pay | Admitting: *Deleted

## 2021-05-23 NOTE — Telephone Encounter (Addendum)
Coronary Stent scheduled at [facility] for:  Tuesday May 28, 2021 1:30 PM [facility] Main Entrance A [facility]) at: 81 AM-needs lab Patient does not have transportation to get lab prior to procedure.   No solid food after midnight prior to cath, clear liquids until 5 AM day of procedure.  Medication instructions: Hold: Lasix/KCl-AM of procedure Jardiance-AM of procedure  Except hold medications usual morning medications can be taken pre-cath with sips of water including: aspirin 81 mg Plavix 75 mg    Confirmed patient has responsible adult to drive home post procedure and be with patient first 24 hours after arriving home.  [facility] does allow one visitor to accompany you and wait in the hospital waiting room while you are there for your procedure. You and your visitor will be asked to wear a mask once you enter the hospital.   Patient reports does not currently have any new symptoms concerning for COVID-19 and no household members with COVID-19 like illness.    Reviewed procedure/mask/visitor instructions with patient's daughter (DPR), April.

## 2021-05-23 NOTE — Telephone Encounter (Signed)
Returned call to patient's daughter, April, who called to verify that patient will continue aspirin and Plavix on the day of the cardiac catheterization. She reports patient takes both medications in the mornings. I advised her to continue aspirin and Plavix and to hold furosemide and potassium on 10/25. Patient verbalized understanding and agreement with plan and thanked me for the call.

## 2021-05-23 NOTE — Telephone Encounter (Signed)
Pt c/o medication issue:  1. Name of Medication:  clopidogrel (PLAVIX) 75 MG tablet  2. How are you currently taking this medication (dosage and times per day)? As prescribed   3. Are you having a reaction (difficulty breathing--STAT)? No   4. What is your medication issue? April is calling wanting to know the instructions on An[place]ia taking this medication prior to her procedure due to it being a blood thinner.

## 2021-05-24 NOTE — Progress Notes (Signed)
Remote pacemaker transmission.   

## 2021-05-27 ENCOUNTER — Other Ambulatory Visit: Payer: Self-pay | Admitting: Cardiology

## 2021-05-27 ENCOUNTER — Telehealth: Payer: Self-pay | Admitting: Cardiology

## 2021-05-27 NOTE — Telephone Encounter (Signed)
New Message:     Patient said she want to talk to somebody about the procedure she is scheduled for tomorrow.[name]

## 2021-05-27 NOTE — Telephone Encounter (Signed)
Ft message to return call.

## 2021-05-27 NOTE — Telephone Encounter (Signed)
I spoke with patient's daughter [facility]), April, with patient on speaker phone.  Patient is aware that Dr Tamala Julian talked with April 05/22/21, agreed to proceed with procedure 05/28/21,  is aware procedure scheduled with Dr Ellyn Hack.  Instructions reviewed again with patient and April,  patient states she does not have any other questions.

## 2021-05-28 ENCOUNTER — Other Ambulatory Visit: Payer: Self-pay

## 2021-05-28 ENCOUNTER — Ambulatory Visit (HOSPITAL_COMMUNITY)
Admission: RE | Disposition: A | Discharge status: Home / Self Care | Payer: Self-pay | Source: Home / Self Care | Attending: Cardiology

## 2021-05-28 ENCOUNTER — Observation Stay (HOSPITAL_COMMUNITY)
Admission: RE | Admit: 2021-05-28 | Discharge: 2021-05-29 | Disposition: A | Discharge status: Home / Self Care | Payer: Medicare Other | Attending: Cardiology | Admitting: Cardiology

## 2021-05-28 DIAGNOSIS — I5032 Chronic diastolic (congestive) heart failure: Secondary | ICD-10-CM | POA: Diagnosis present

## 2021-05-28 DIAGNOSIS — Z79899 Other long term (current) drug therapy: Secondary | ICD-10-CM | POA: Diagnosis not present

## 2021-05-28 DIAGNOSIS — Z87891 Personal history of nicotine dependence: Secondary | ICD-10-CM | POA: Diagnosis not present

## 2021-05-28 DIAGNOSIS — I110 Hypertensive heart disease with heart failure: Secondary | ICD-10-CM | POA: Diagnosis not present

## 2021-05-28 DIAGNOSIS — R079 Chest pain, unspecified: Secondary | ICD-10-CM | POA: Diagnosis present

## 2021-05-28 DIAGNOSIS — I2510 Atherosclerotic heart disease of native coronary artery without angina pectoris: Secondary | ICD-10-CM | POA: Diagnosis present

## 2021-05-28 DIAGNOSIS — I480 Paroxysmal atrial fibrillation: Secondary | ICD-10-CM | POA: Diagnosis not present

## 2021-05-28 DIAGNOSIS — Z950 Presence of cardiac pacemaker: Secondary | ICD-10-CM | POA: Diagnosis not present

## 2021-05-28 DIAGNOSIS — Z7902 Long term (current) use of antithrombotics/antiplatelets: Secondary | ICD-10-CM | POA: Diagnosis not present

## 2021-05-28 DIAGNOSIS — Z955 Presence of coronary angioplasty implant and graft: Secondary | ICD-10-CM

## 2021-05-28 DIAGNOSIS — Z7982 Long term (current) use of aspirin: Secondary | ICD-10-CM | POA: Insufficient documentation

## 2021-05-28 DIAGNOSIS — I214 Non-ST elevation (NSTEMI) myocardial infarction: Secondary | ICD-10-CM | POA: Diagnosis not present

## 2021-05-28 DIAGNOSIS — I25118 Atherosclerotic heart disease of native coronary artery with other forms of angina pectoris: Secondary | ICD-10-CM | POA: Diagnosis not present

## 2021-05-28 DIAGNOSIS — J449 Chronic obstructive pulmonary disease, unspecified: Secondary | ICD-10-CM | POA: Insufficient documentation

## 2021-05-28 DIAGNOSIS — Z7984 Long term (current) use of oral hypoglycemic drugs: Secondary | ICD-10-CM | POA: Insufficient documentation

## 2021-05-28 DIAGNOSIS — I25110 Atherosclerotic heart disease of native coronary artery with unstable angina pectoris: Secondary | ICD-10-CM | POA: Diagnosis not present

## 2021-05-28 DIAGNOSIS — I495 Sick sinus syndrome: Secondary | ICD-10-CM | POA: Diagnosis not present

## 2021-05-28 HISTORY — PX: CORONARY STENT INTERVENTION: CATH118234

## 2021-05-28 HISTORY — PX: CORONARY ULTRASOUND/IVUS: CATH118244

## 2021-05-28 HISTORY — PX: INTRAVASCULAR ULTRASOUND/IVUS: CATH118244

## 2021-05-28 LAB — CBC
HCT: 42.5 % (ref 36.0–46.0)
Hemoglobin: 13.5 g/dL (ref 12.0–15.0)
MCH: 30 pg (ref 26.0–34.0)
MCHC: 31.8 g/dL (ref 30.0–36.0)
MCV: 94.4 fL (ref 80.0–100.0)
Platelets: 254 10*3/uL (ref 150–400)
RBC: 4.5 MIL/uL (ref 3.87–5.11)
RDW: 13.9 % (ref 11.5–15.5)
WBC: 7 10*3/uL (ref 4.0–10.5)
nRBC: 0 % (ref 0.0–0.2)

## 2021-05-28 LAB — POCT ACTIVATED CLOTTING TIME
Activated Clotting Time: 248 seconds
Activated Clotting Time: 271 seconds
Activated Clotting Time: 410 seconds
Activated Clotting Time: 225 seconds

## 2021-05-28 LAB — BASIC METABOLIC PANEL
Anion gap: 7 (ref 5–15)
BUN: 10 mg/dL (ref 8–23)
CO2: 28 mmol/L (ref 22–32)
Calcium: 9.3 mg/dL (ref 8.9–10.3)
Chloride: 103 mmol/L (ref 98–111)
Creatinine, Ser: 0.74 mg/dL (ref 0.44–1.00)
GFR, Estimated: >60 mL/min (ref >60)
Glucose, Bld: 90 mg/dL (ref 70–99)
Potassium: 4.6 mmol/L (ref 3.5–5.1)
Sodium: 138 mmol/L (ref 135–145)

## 2021-05-28 SURGERY — CORONARY STENT INTERVENTION
Anesthesia: LOCAL

## 2021-05-28 MED ORDER — SODIUM CHLORIDE 0.9% FLUSH: 3.0000 mL | INTRAVENOUS | Status: DC | PRN

## 2021-05-28 MED ORDER — HYDRALAZINE HCL 20 MG/ML IJ SOLN
INTRAMUSCULAR | Status: AC
  Filled 2021-05-28: qty 1

## 2021-05-28 MED ORDER — ATORVASTATIN CALCIUM 80 MG PO TABS
80.0000 mg | ORAL_TABLET | Freq: Every day | ORAL | Status: DC
  Administered 2021-05-29: 80 mg via ORAL
  Filled 2021-05-28: qty 1

## 2021-05-28 MED ORDER — MIDAZOLAM HCL 2 MG/2ML IJ SOLN
INTRAMUSCULAR | Status: DC | PRN
  Administered 2021-05-28: 1 mg via INTRAVENOUS

## 2021-05-28 MED ORDER — SODIUM CHLORIDE 0.9 % IV SOLN: 250.0000 mL | INTRAVENOUS | Status: DC | PRN

## 2021-05-28 MED ORDER — NITROGLYCERIN 1 MG/10 ML FOR IR/CATH LAB
INTRA_ARTERIAL | Status: DC | PRN
  Administered 2021-05-28: 200 ug

## 2021-05-28 MED ORDER — METOPROLOL SUCCINATE ER 50 MG PO TB24
50.0000 mg | ORAL_TABLET | Freq: Every day | ORAL | Status: DC
  Administered 2021-05-28: 50 mg via ORAL
  Filled 2021-05-28: qty 1

## 2021-05-28 MED ORDER — HEPARIN (PORCINE) IN NACL 1000-0.9 UT/500ML-% IV SOLN
INTRAVENOUS | Status: DC | PRN
  Administered 2021-05-28 (×2): 500 mL

## 2021-05-28 MED ORDER — SODIUM CHLORIDE 0.9% FLUSH
3.0000 mL | Freq: Two times a day (BID) | INTRAVENOUS | Status: DC
  Administered 2021-05-29: 3 mL via INTRAVENOUS

## 2021-05-28 MED ORDER — ALPRAZOLAM 1 MG PO TABS: 1.0000 mg | ORAL_TABLET | Freq: Three times a day (TID) | ORAL | Status: DC | PRN

## 2021-05-28 MED ORDER — EMPAGLIFLOZIN 10 MG PO TABS
10.0000 mg | ORAL_TABLET | Freq: Every day | ORAL | Status: DC
  Administered 2021-05-29: 10 mg via ORAL
  Filled 2021-05-28: qty 1

## 2021-05-28 MED ORDER — FENTANYL CITRATE (PF) 100 MCG/2ML IJ SOLN
INTRAMUSCULAR | Status: AC
  Filled 2021-05-28: qty 2

## 2021-05-28 MED ORDER — PRIMIDONE 50 MG PO TABS
50.0000 mg | ORAL_TABLET | Freq: Every day | ORAL | Status: DC
  Administered 2021-05-28: 50 mg via ORAL
  Filled 2021-05-28 (×2): qty 1

## 2021-05-28 MED ORDER — LABETALOL HCL 5 MG/ML IV SOLN: 10.0000 mg | INTRAVENOUS | Status: AC | PRN

## 2021-05-28 MED ORDER — NITROGLYCERIN 0.4 MG SL SUBL: 0.4000 mg | SUBLINGUAL_TABLET | SUBLINGUAL | Status: DC | PRN

## 2021-05-28 MED ORDER — IOHEXOL 350 MG/ML SOLN
INTRAVENOUS | Status: DC | PRN
  Administered 2021-05-28: 110 mL

## 2021-05-28 MED ORDER — ISOSORBIDE MONONITRATE ER 60 MG PO TB24
120.0000 mg | ORAL_TABLET | Freq: Every day | ORAL | Status: DC
  Administered 2021-05-29: 120 mg via ORAL
  Filled 2021-05-28: qty 2

## 2021-05-28 MED ORDER — CLOPIDOGREL BISULFATE 75 MG PO TABS: 75.0000 mg | ORAL_TABLET | ORAL | Status: DC

## 2021-05-28 MED ORDER — HYDRALAZINE HCL 20 MG/ML IJ SOLN: 10.0000 mg | INTRAMUSCULAR | Status: AC | PRN

## 2021-05-28 MED ORDER — ALPRAZOLAM 0.5 MG PO TABS
1.0000 mg | ORAL_TABLET | Freq: Three times a day (TID) | ORAL | Status: DC | PRN
  Administered 2021-05-28: 1 mg via ORAL
  Filled 2021-05-28: qty 2

## 2021-05-28 MED ORDER — HYDRALAZINE HCL 20 MG/ML IJ SOLN
INTRAMUSCULAR | Status: DC | PRN
  Administered 2021-05-28: 10 mg via INTRAVENOUS

## 2021-05-28 MED ORDER — NITROGLYCERIN 1 MG/10 ML FOR IR/CATH LAB
INTRA_ARTERIAL | Status: AC
  Filled 2021-05-28: qty 10

## 2021-05-28 MED ORDER — MORPHINE SULFATE (PF) 2 MG/ML IV SOLN: 1.0000 mg | INTRAVENOUS | Status: DC | PRN

## 2021-05-28 MED ORDER — LIDOCAINE HCL (PF) 1 % IJ SOLN
INTRAMUSCULAR | Status: AC
  Filled 2021-05-28: qty 30

## 2021-05-28 MED ORDER — AMIODARONE HCL 200 MG PO TABS
200.0000 mg | ORAL_TABLET | ORAL | Status: DC
  Administered 2021-05-29: 200 mg via ORAL
  Filled 2021-05-28: qty 1

## 2021-05-28 MED ORDER — CLOPIDOGREL BISULFATE 75 MG PO TABS
75.0000 mg | ORAL_TABLET | Freq: Every day | ORAL | Status: DC
  Administered 2021-05-29: 75 mg via ORAL
  Filled 2021-05-28: qty 1

## 2021-05-28 MED ORDER — MIDAZOLAM HCL 2 MG/2ML IJ SOLN
INTRAMUSCULAR | Status: AC
  Filled 2021-05-28: qty 2

## 2021-05-28 MED ORDER — FUROSEMIDE 40 MG PO TABS: 40.0000 mg | ORAL_TABLET | Freq: Every day | ORAL | Status: DC | PRN

## 2021-05-28 MED ORDER — HEPARIN (PORCINE) IN NACL 1000-0.9 UT/500ML-% IV SOLN
INTRAVENOUS | Status: AC
  Filled 2021-05-28: qty 1000

## 2021-05-28 MED ORDER — LIDOCAINE HCL (PF) 1 % IJ SOLN
INTRAMUSCULAR | Status: DC | PRN
  Administered 2021-05-28: 15 mL

## 2021-05-28 MED ORDER — HEPARIN SODIUM (PORCINE) 1000 UNIT/ML IJ SOLN
INTRAMUSCULAR | Status: DC | PRN
  Administered 2021-05-28: 3000 [IU] via INTRAVENOUS
  Administered 2021-05-28: 5500 [IU] via INTRAVENOUS

## 2021-05-28 MED ORDER — SENNOSIDES-DOCUSATE SODIUM 8.6-50 MG PO TABS: 1.0000 | ORAL_TABLET | Freq: Every day | ORAL | Status: DC | PRN

## 2021-05-28 MED ORDER — RANOLAZINE ER 500 MG PO TB12
500.0000 mg | ORAL_TABLET | Freq: Two times a day (BID) | ORAL | Status: DC
  Administered 2021-05-28 – 2021-05-29 (×2): 500 mg via ORAL
  Filled 2021-05-28 (×2): qty 1

## 2021-05-28 MED ORDER — AMLODIPINE BESYLATE 5 MG PO TABS
5.0000 mg | ORAL_TABLET | Freq: Every day | ORAL | Status: DC
  Administered 2021-05-28 – 2021-05-29 (×2): 5 mg via ORAL
  Filled 2021-05-28 (×2): qty 1

## 2021-05-28 MED ORDER — PROMETHAZINE HCL 6.25 MG/5ML PO SYRP
6.2500 mg | ORAL_SOLUTION | Freq: Four times a day (QID) | ORAL | Status: DC | PRN
  Filled 2021-05-28: qty 5

## 2021-05-28 MED ORDER — ASPIRIN EC 81 MG PO TBEC
81.0000 mg | DELAYED_RELEASE_TABLET | Freq: Every day | ORAL | Status: DC
  Administered 2021-05-29: 81 mg via ORAL
  Filled 2021-05-28: qty 1

## 2021-05-28 MED ORDER — ONDANSETRON HCL 4 MG PO TABS
4.0000 mg | ORAL_TABLET | Freq: Four times a day (QID) | ORAL | Status: DC | PRN
  Administered 2021-05-29: 4 mg via ORAL
  Filled 2021-05-28: qty 1

## 2021-05-28 MED ORDER — SODIUM CHLORIDE 0.9 % WEIGHT BASED INFUSION: 1.0000 mL/kg/h | INTRAVENOUS | Status: DC

## 2021-05-28 MED ORDER — ASPIRIN 81 MG PO CHEW: 81.0000 mg | CHEWABLE_TABLET | ORAL | Status: DC

## 2021-05-28 MED ORDER — HEPARIN SODIUM (PORCINE) 1000 UNIT/ML IJ SOLN
INTRAMUSCULAR | Status: AC
  Filled 2021-05-28: qty 1

## 2021-05-28 MED ORDER — FENTANYL CITRATE (PF) 100 MCG/2ML IJ SOLN
INTRAMUSCULAR | Status: DC | PRN
  Administered 2021-05-28 (×2): 12.5 ug via INTRAVENOUS

## 2021-05-28 MED ORDER — METOPROLOL SUCCINATE ER 50 MG PO TB24
75.0000 mg | ORAL_TABLET | Freq: Every morning | ORAL | Status: DC
  Administered 2021-05-29: 75 mg via ORAL
  Filled 2021-05-28: qty 2

## 2021-05-28 MED ORDER — SODIUM CHLORIDE 0.9 % IV SOLN: INTRAVENOUS | Status: AC

## 2021-05-28 MED ORDER — PANTOPRAZOLE SODIUM 40 MG PO TBEC
40.0000 mg | DELAYED_RELEASE_TABLET | Freq: Every day | ORAL | Status: DC
  Administered 2021-05-29: 40 mg via ORAL
  Filled 2021-05-28: qty 1

## 2021-05-28 MED ORDER — POTASSIUM CHLORIDE CRYS ER 20 MEQ PO TBCR: 20.0000 meq | EXTENDED_RELEASE_TABLET | ORAL | Status: DC

## 2021-05-28 MED ORDER — ACETAMINOPHEN 325 MG PO TABS
650.0000 mg | ORAL_TABLET | ORAL | Status: DC | PRN
Start: 2021-05-28 — End: 2021-05-29

## 2021-05-28 MED ORDER — SODIUM CHLORIDE 0.9 % WEIGHT BASED INFUSION
3.0000 mL/kg/h | INTRAVENOUS | Status: DC
  Administered 2021-05-28: 3 mL/kg/h via INTRAVENOUS

## 2021-05-28 MED ORDER — ALBUTEROL SULFATE (2.5 MG/3ML) 0.083% IN NEBU: 3.0000 mL | INHALATION_SOLUTION | RESPIRATORY_TRACT | Status: DC | PRN

## 2021-05-28 SURGICAL SUPPLY — 29 items
BALL SAPPHIRE NC24 3.5X26 (BALLOONS) ×2
BALLN SAPPHIRE 2.0X12 (BALLOONS) ×2
BALLN SCOREFLEX 2.50X15 (BALLOONS) ×2
BALLN SCOREFLEX 3.0X15 (BALLOONS) ×2
BALLOON SAPPHIRE 2.0X12 (BALLOONS) IMPLANT
BALLOON SAPPHIRE NC24 3.5X26 (BALLOONS) IMPLANT
BALLOON SCOREFLEX 2.50X15 (BALLOONS) IMPLANT
BALLOON SCOREFLEX 3.0X15 (BALLOONS) IMPLANT
CATH LAUNCHER 6FR AL.75 (CATHETERS) ×1 IMPLANT
CATH OPTICROSS HD (CATHETERS) ×1 IMPLANT
CATH SHOCKWAVE 3.0X12 (CATHETERS) IMPLANT
CATHETER SHOCKWAVE 3.0X12 (CATHETERS) ×2
CLOSURE PERCLOSE PROSTYLE (VASCULAR PRODUCTS) ×1 IMPLANT
COVER SWIFTLINK CONNECTOR (BAG) ×1 IMPLANT
GUIDELINER 6F (CATHETERS) ×1 IMPLANT
KIT ENCORE 26 ADVANTAGE (KITS) ×1 IMPLANT
KIT HEART LEFT (KITS) ×2 IMPLANT
KIT MICROPUNCTURE NIT STIFF (SHEATH) ×1 IMPLANT
PACK CARDIAC CATHETERIZATION (CUSTOM PROCEDURE TRAY) ×2 IMPLANT
SHEATH PINNACLE 6F 10CM (SHEATH) ×1 IMPLANT
SLED PULL BACK IVUS (MISCELLANEOUS) ×1 IMPLANT
STENT SYNERGY XD 3.0X48 (Permanent Stent) IMPLANT
STENT SYNERGY XD 3.50X12 (Permanent Stent) IMPLANT
SYNERGY XD 3.0X48 (Permanent Stent) ×2 IMPLANT
SYNERGY XD 3.50X12 (Permanent Stent) ×2 IMPLANT
TRANSDUCER W/STOPCOCK (MISCELLANEOUS) ×2 IMPLANT
TUBING CIL FLEX 10 FLL-RA (TUBING) ×2 IMPLANT
WIRE ASAHI PROWATER 180CM (WIRE) ×1 IMPLANT
WIRE EMERALD 3MM-J .035X150CM (WIRE) ×1 IMPLANT

## 2021-05-28 NOTE — Interval H&P Note (Signed)
History and Physical Interval Note:  05/28/2021 3:00 PM  [name]  has presented today for surgery, with the diagnosis of CAD-> unstable angina, with recent non-STEMI..  The various methods of treatment have been discussed with the patient and family. After consideration of risks, benefits and other options for treatment, the patient has consented to  Procedure(s): CORONARY STENT INTERVENTION (N/A) Intravascular Ultrasound/IVUS (N/A)    as a surgical intervention.  The patient's history has been reviewed, patient examined, no change in status, stable for surgery.  I have reviewed the patient's chart and labs.  Questions were answered to the patient's satisfaction.    Cath Lab Visit (complete for each Cath Lab visit)  Clinical Evaluation Leading to the Procedure:   ACS: No. ->  Recent non-STEMI  Non-ACS:    Anginal Classification: CCS IV  Anti-ischemic medical therapy: Maximal Therapy (2 or more classes of medications)  Non-Invasive Test Results: No non-invasive testing performed  Prior CABG: No previous CABG    [name]

## 2021-05-28 NOTE — Progress Notes (Signed)
  Chaplain visited [name] at her bedside as she awaits a heart cath. Pt reports she wants to complete a living will and consented to further education. Chaplain explained advance directives, notably living will and health care power of attorney as well as [place] law regarding designated health care agents when an HCPOA has not been completed. Katrina Leisey shared some complicated family dynamics and named that she wants her daughter, [name], to be her health care agent if she is unable to make medical decisions on her behalf.  If [name] is unable to act in this role then she would like Patsy Billingsly.  Katrina Freeze called her daughter during our visit to confirm contact information and her daughter named some confusion regarding the current visitation policy. Katrina Schedler's granddaugther April brought her to the hospital since they thought she was only allowed to have one person accompany her and April will likely be the one helping Katrina Bufkin with her follow up care, however Katrina Quarry is growing anxious that something will happen during the procedure.   In the process of completing the AD documentation, Katrina Orea wanted to be more specific regarding her living will choices than the documentation allows. She named that she has had these conversations with her daughter and chaplain assured Katrina Carreras that if she does not complete that portion of the document then the decisions would default to her daughter. She named a desire to complete the documents and discuss them more with [name] alongside a fear of what would happen if things went wrong during her procedure. Chaplain had pt's RN join [place] at her bedside to clarify and witness that pt desires that her daughter be the Health Care agent (which is [place] default) and Patsy to the be second call if necessary.  Both RN and chaplain assured pt that this is standard and the documents were placed, unnotarized or signed, in pt's belonging bag for her to complete when she feels  ready.  Chaplain voiced understanding of the stress and worry of the situation and offered support and affirmation of the need to care for self. Pt was tearful, but expressed gratitude. She is hopeful that her daughter is on her way to the hospital now.  Please page as further needs arise.  Donald Prose. Elyn Peers, M.Div. [facility] Chaplain Pager [phone] Office [phone]      05/28/21 1230  Clinical Encounter Type  Visited With Patient;Health care provider  Visit Type Initial;Spiritual support;Psychological support  Stress Factors  Family Stress Factors Health changes  Advance Directives (For Healthcare)  Does Patient Have a Medical Advance Directive? No  Would patient like information on creating a medical advance directive? Yes (Inpatient - patient defers creating a medical advance directive at this time - Information given)

## 2021-05-28 NOTE — Progress Notes (Signed)
Chaplin returned call states someone will come over.

## 2021-05-28 NOTE — Progress Notes (Signed)
Pt request help with her living will. Page sent to chaplin to return call.

## 2021-05-28 NOTE — Progress Notes (Signed)
Purewick placed, tolerated well, safety maintained ° °

## 2021-05-29 ENCOUNTER — Encounter (HOSPITAL_COMMUNITY): Payer: Self-pay | Admitting: Cardiology

## 2021-05-29 ENCOUNTER — Encounter: Payer: Self-pay | Admitting: *Deleted

## 2021-05-29 DIAGNOSIS — J449 Chronic obstructive pulmonary disease, unspecified: Secondary | ICD-10-CM | POA: Diagnosis not present

## 2021-05-29 DIAGNOSIS — I495 Sick sinus syndrome: Secondary | ICD-10-CM | POA: Diagnosis not present

## 2021-05-29 DIAGNOSIS — I480 Paroxysmal atrial fibrillation: Secondary | ICD-10-CM | POA: Diagnosis not present

## 2021-05-29 DIAGNOSIS — Z79899 Other long term (current) drug therapy: Secondary | ICD-10-CM | POA: Diagnosis not present

## 2021-05-29 DIAGNOSIS — Z7982 Long term (current) use of aspirin: Secondary | ICD-10-CM | POA: Diagnosis not present

## 2021-05-29 DIAGNOSIS — I2510 Atherosclerotic heart disease of native coronary artery without angina pectoris: Secondary | ICD-10-CM | POA: Diagnosis not present

## 2021-05-29 DIAGNOSIS — Z7984 Long term (current) use of oral hypoglycemic drugs: Secondary | ICD-10-CM | POA: Diagnosis not present

## 2021-05-29 DIAGNOSIS — Z87891 Personal history of nicotine dependence: Secondary | ICD-10-CM | POA: Diagnosis not present

## 2021-05-29 DIAGNOSIS — Z7902 Long term (current) use of antithrombotics/antiplatelets: Secondary | ICD-10-CM | POA: Diagnosis not present

## 2021-05-29 DIAGNOSIS — I214 Non-ST elevation (NSTEMI) myocardial infarction: Secondary | ICD-10-CM | POA: Diagnosis not present

## 2021-05-29 DIAGNOSIS — Z950 Presence of cardiac pacemaker: Secondary | ICD-10-CM | POA: Diagnosis not present

## 2021-05-29 DIAGNOSIS — I5032 Chronic diastolic (congestive) heart failure: Secondary | ICD-10-CM | POA: Diagnosis not present

## 2021-05-29 DIAGNOSIS — I110 Hypertensive heart disease with heart failure: Secondary | ICD-10-CM | POA: Diagnosis not present

## 2021-05-29 DIAGNOSIS — R0602 Shortness of breath: Secondary | ICD-10-CM | POA: Diagnosis not present

## 2021-05-29 DIAGNOSIS — I25118 Atherosclerotic heart disease of native coronary artery with other forms of angina pectoris: Secondary | ICD-10-CM | POA: Diagnosis not present

## 2021-05-29 LAB — BASIC METABOLIC PANEL
Anion gap: 7 (ref 5–15)
BUN: 11 mg/dL (ref 8–23)
CO2: 25 mmol/L (ref 22–32)
Calcium: 9.1 mg/dL (ref 8.9–10.3)
Chloride: 105 mmol/L (ref 98–111)
Creatinine, Ser: 0.64 mg/dL (ref 0.44–1.00)
GFR, Estimated: >60 mL/min (ref >60)
Glucose, Bld: 110 mg/dL — ABNORMAL HIGH (ref 70–99)
Potassium: 3.7 mmol/L (ref 3.5–5.1)
Sodium: 137 mmol/L (ref 135–145)

## 2021-05-29 LAB — CBC
HCT: 39.6 % (ref 36.0–46.0)
Hemoglobin: 12.5 g/dL (ref 12.0–15.0)
MCH: 29.5 pg (ref 26.0–34.0)
MCHC: 31.6 g/dL (ref 30.0–36.0)
MCV: 93.4 fL (ref 80.0–100.0)
Platelets: 212 10*3/uL (ref 150–400)
RBC: 4.24 MIL/uL (ref 3.87–5.11)
RDW: 14.1 % (ref 11.5–15.5)
WBC: 7.7 10*3/uL (ref 4.0–10.5)
nRBC: 0 % (ref 0.0–0.2)

## 2021-05-29 MED ORDER — ISOSORBIDE MONONITRATE ER 30 MG PO TB24: 60.0000 mg | ORAL_TABLET | Freq: Every day | ORAL | 0 refills | Status: DC

## 2021-05-29 MED ORDER — SODIUM CHLORIDE 0.9 % IV BOLUS
250.0000 mL | Freq: Once | INTRAVENOUS | Status: AC
  Administered 2021-05-29: 250 mL via INTRAVENOUS

## 2021-05-29 MED ORDER — ISOSORBIDE MONONITRATE ER 60 MG PO TB24: 60.0000 mg | ORAL_TABLET | Freq: Every day | ORAL | Status: DC

## 2021-05-29 MED FILL — Hydralazine HCl Inj 20 MG/ML: INTRAMUSCULAR | Qty: 1 | Status: AC

## 2021-05-29 NOTE — Telephone Encounter (Signed)
This encounter was created in error - please disregard.

## 2021-05-29 NOTE — Care Management CC44 (Signed)
Condition Code 44 Documentation Completed  Patient Details  Name: [name] MRN: [id number] Date of Birth: 05/25/1936   Condition Code 44 given:  Yes Patient signature on Condition Code 44 notice:  Yes Documentation of 2 MD's agreement:  Yes Code 44 added to claim:  Yes    Bethena Roys, RN 05/29/2021, 12:46 PM

## 2021-05-29 NOTE — Discharge Summary (Signed)
Discharge Summary    Patient ID: DENESHIA [name] MRN: [id number]; DOB: 06/02/1936  Admit date: 05/28/2021 Discharge date: 05/29/2021  PCP:  Celene Squibb, MD   [facility] Providers Cardiologist:  Rozann Lesches, MD  Electrophysiologist:  [name], MD    Discharge Diagnoses    Principal Problem:   Coronary artery disease involving native heart with unstable angina pectoris [facility]) Active Problems:   Chronic diastolic heart failure ([place])   Non-STEMI (non-ST elevated myocardial infarction) ([place])   Chest pain   CAD (coronary artery disease)   Diagnostic Studies/Procedures    Cath: 05/28/21    ---------- IVUS-guided PTCA/PCI: Combination SHOCKWAVE LITHOTRIPSY, & SCOREFLEX ANGIOPLASTY with GUIDELINER support-----------   LESION #1 prox RCA lesion is 85% stenosed distal to previous stent.   Scoring balloon angioplasty was performed following SHOCKWAVE LITHOTRIPSY (3.0 x 12) BALLN (2 runs of 10 pulses) using a BALLN SCOREFLEX 3.0X15.   Lesion #2 mid RCA to Dist RCA lesion is 65% stenosed.   Scoring balloon angioplasty was performed using a BALLN SCOREFLEX 3.0X15.  With Use of GuideLiner Catheter Extension   A drug-eluting stent was successfully placed covering both LESIONS #1 and #2 (overlapping previous stent) using a SYNERGY XD 3.0X48.  Postdilated to 3.6 mm   Post intervention, there is a 0% residual stenosis in both these LESIONS #1 and #2.Marland Kitchen   LESION #3 Ost RCA to Prox RCA previously placed stents are 70% stenosed.   Scoring balloon angioplasty was performed using a BALLN SCOREFLEX 3.0X15 -> followed by post dilation with 3.5 mm x 15 mm [place] balloon. ->  Achieved 3.5 mm.   Post intervention, there is a 10% residual in-stent restenosis.   ---------------------------------   Lesion #4 Ost RCA lesion is 75% stenosed.   A drug-eluting stent was successfully placed at the ostium and overlapping previous stent proximally, using a SYNERGY XD 3.50X12.  Postdilated to 3.65 mm.   Post  intervention, there is a 0% residual stenosis.   Post intervention, there is a 0% residual stenosis with exception of the 10% residual ISR in the previously placed stents which are likely undersized 2.75 mm stents.   SUMMARY Successful Complext DES PCI of Ostial & mid to Distal RCA using Shockwave Lithotrypsy and ScoreFlex Scoring Balloon Angioplasty with Guideliner Extension Catheter. Mid-Distal RCA tandem lesions: Finally treated with distally overlapping SYNERGY XD (DES) 3.0X48 mm postdilated to 3.6 mm High-pressure score flex angioplasty followed by post dilation of previously placed ostial and proximal RCA stent dilating up to 3.5 mm-residual 10% Ostial RCA PCI with SYNERGY XD 3.5X12. ->  Deployed at 3.65 mm. --> All lesions segments reduced to 0% with exception of the in-stent restenosis portion which was 10%. --> TIMI-3 flow pre and post.   Diagnostic Dominance: Right Intervention    _____________   History of Present Illness     [name] is a 85 y.o. female with of atrial fibrillation, COPD, CAD, HTN, GERD, tachybradycardia syndrome with dual-chamber pacemaker, HLD, neuromuscular disorder   She was last seen by Cecilie Kicks, NP 04/12/2021.  She had a previous NSTEMI in August 2022.  Recommendations were for medical therapy versus CABG versus elevated risk RCA PCI.  Patient opted for medical therapy.  Plavix was added and to be continued for 1 year.  Blood pressure was elevated on admission with amlodipine added.  BiDil changed to long-acting Imdur.  Hydralazine was added.  She was continuing on beta-blocker and ARB.  She declined anticoagulation for atrial fibrillation.  Tachybradycardia  syndrome with PPM.  She was on amiodarone.  Later in August on the 27th she was readmitted for chest pain with associated hypotension.  She was COVID-positive.  Both her ARB and hydralazine were stopped.  Her Imdur was increased to 90 mg secondary to chest pain.   Seen in the office on 10/17 with  ongoing chest pain. She saw Dr. Roxan Hockey on 05/14/2021 for refractory angina despite maximal medical therapy.  She was in the hospital in August and had a catheterization which showed three-vessel disease.  There were discussions regarding medical therapy versus CABG versus PCI to RCA.  She opted for medical therapy.  She wished to have an angioplasty but was sent for cardiac surgical evaluation.  Dr. Roxan Hockey mentioned although her anatomy was best suited for CABG she was a poor candidate for surgery.  He mentioned angioplasty of RCA was the best option.  Plan was to follow-up with Dr. Domenic Polite and Dr. Lovena Le.   In the office she voiced wanting to proceed with RCA intervention.  She continued to have chest pain. The discussed the risks and benefits of cardiac catheterization/PCI to RCA.  She understands and wishes to proceed. This was arranged as an outpatient.   Hospital Course     CAD: Underwent cardiac catheterization noted above with successful complex PCI/DESx2 of ostial and mid to distal RCA with shockwave lithotripsy and scoring balloon angioplasty.  Recommendations for DAPT with aspirin/Plavix for at least 1 year, likely monotherapy with Plavix after that.  No recurrent chest pain overnight. Seen by CR and ambulated without difficulty. -- continue ASA/plavix, BB, statin, reduced Imdur (120-60mg  daily)   Hypertension: Blood pressures initially elevated, but received all of her BP meds the morning of discharge. She then felt lightheaded and dizzy.  States she normally spaces her medications out throughout the day. --We will continue metoprolol 75 mg a.m., 50 mg p.m. --Reduce Imdur from 120 mg daily to 60 mg daily, stop norvasc 10mg  daily for now --Continue Ranexa 500 mg twice daily  She was able to ambulate with CR and mobility specialist prior to discharge without complaints. BP improved as well.    Hyperlipidemia: On atorvastatin 80 mg daily   Paroxysmal atrial fibrillation: remains  on amiodarone 200mg  daily. Not a candidate for [place] historically   HFpEF: Echo 8/22 with EF of 55-60%, no rWMA. Does not appear volume overloaded on exam -- continue BB, outpatient lasix -- on jardiance PTA  Did the patient have an acute coronary syndrome (MI, NSTEMI, STEMI, etc) this admission?:  No                               Did the patient have a percutaneous coronary intervention (stent / angioplasty)?:  Yes.     Cath/PCI Registry Performance & Quality Measures: Aspirin prescribed? - Yes ADP Receptor Inhibitor (Plavix/Clopidogrel, Brilinta/Ticagrelor or Effient/Prasugrel) prescribed (includes medically managed patients)? - Yes High Intensity Statin (Lipitor 40-80mg  or Crestor 20-40mg ) prescribed? - Yes For EF <40%, was ACEI/ARB prescribed? - Not Applicable (EF >/= 28%) For EF <40%, Aldosterone Antagonist (Spironolactone or Eplerenone) prescribed? - Not Applicable (EF >/= 41%) Cardiac Rehab Phase II ordered? - Yes       The patient will be scheduled for a TOC follow up appointment in 10-14 days.  A message has been sent to the [facility] and Scheduling Pool at the office where the patient should be seen for follow up.  _____________  Discharge  Vitals Blood pressure (!) 94/48, pulse 73, temperature 98.6 F (37 C), temperature source Oral, resp. rate 17, height 5' 5.5" (1.664 m), weight 59.9 kg, SpO2 97 %.  Filed Weights   05/28/21 1108  Weight: 59.9 kg    Labs & Radiologic Studies    CBC Recent Labs    05/28/21 1048 05/29/21 0235  WBC 7.0 7.7  HGB 13.5 12.5  HCT 42.5 39.6  MCV 94.4 93.4  PLT 254 683   Basic Metabolic Panel Recent Labs    05/28/21 1048 05/29/21 0235  NA 138 137  K 4.6 3.7  CL 103 105  CO2 28 25  GLUCOSE 90 110*  BUN 10 11  CREATININE 0.74 0.64  CALCIUM 9.3 9.1   Liver Function Tests No results for input(s): AST, ALT, ALKPHOS, BILITOT, PROT, ALBUMIN in the last 72 hours. No results for input(s): LIPASE, AMYLASE in the last 72 hours. High  Sensitivity Troponin:   No results for input(s): TROPONINIHS in the last 720 hours.  BNP Invalid input(s): POCBNP D-Dimer No results for input(s): DDIMER in the last 72 hours. Hemoglobin A1C No results for input(s): HGBA1C in the last 72 hours. Fasting Lipid Panel No results for input(s): CHOL, HDL, LDLCALC, TRIG, CHOLHDL, LDLDIRECT in the last 72 hours. Thyroid Function Tests No results for input(s): TSH, T4TOTAL, T3FREE, THYROIDAB in the last 72 hours.  Invalid input(s): FREET3 _____________  US Abdomen Complete  Result Date: 05/17/2021 CLINICAL DATA:  Mass of pancreas. EXAM: ABDOMEN ULTRASOUND COMPLETE COMPARISON:  Chest abdomen pelvis CT 03/19/2021. Abdomen pelvis CT 05/25/2017 FINDINGS: Gallbladder: Surgically absent. Common bile duct: Diameter: 6 mm, normal. Liver: Scattered echogenic foci consistent with calcifications, as seen on prior CT. No focal lesion identified. Borderline increased and mildly heterogeneous in parenchymal echogenicity. Portal vein is patent on color Doppler imaging with normal direction of blood flow towards the liver. IVC: No abnormality visualized. Pancreas: Suspect hypoechoic 1.5 x 1.3 x 1.1 cm lesion in the pancreatic head at site of abnormality on CT. This is minimally complicated with some peripheral areas of increased echogenicity. The remainder of the pancreas is not well-defined. No obvious pancreatic ductal dilatation. Spleen: Normal in size. Scattered calcifications consistent with granuloma. No discrete intrasplenic lesion. Right Kidney: Length: 10.7 cm. Normal parenchymal echogenicity. Mild parenchymal thinning. No hydronephrosis. No visualized stone or focal lesion. Left Kidney: Length: 11.9 cm. Normal parenchymal echogenicity. Mild parenchymal thinning. No hydronephrosis. No visualized stone or focal lesion. Abdominal aorta: No aneurysm visualized.  Atherosclerosis. Other findings: No visualized ascites. IMPRESSION: 1. Suspected 1.5 cm hypoechoic lesion  in the pancreatic head at site of abnormality on CT. Recommend pancreatic protocol characterization with MRI (preferred if patient is able to tolerate breath hold technique) or pancreatic protocol CT if breath hold technique is not feasible. 2. Cholecystectomy without biliary dilatation. 3. Sequela prior granulomatous disease with splenic and hepatic punctate calcifications. These results will be called to the ordering clinician or representative by the Radiologist Assistant, and communication documented in the PACS or [employer]. Electronically Signed   By: Keith Rake M.D.   On: 05/17/2021 15:45   CARDIAC CATHETERIZATION  Result Date: 05/28/2021   ---------- IVUS-guided PTCA/PCI: Combination SHOCKWAVE LITHOTRIPSY, & SCOREFLEX ANGIOPLASTY with GUIDELINER support-----------   LESION #1 prox RCA lesion is 85% stenosed distal to previous stent.   Scoring balloon angioplasty was performed following SHOCKWAVE LITHOTRIPSY (3.0 x 12) BALLN (2 runs of 10 pulses) using a BALLN SCOREFLEX 3.0X15.   Lesion #2 mid RCA to Dist RCA  lesion is 65% stenosed.   Scoring balloon angioplasty was performed using a BALLN SCOREFLEX 3.0X15.  With Use of GuideLiner Catheter Extension   A drug-eluting stent was successfully placed covering both LESIONS #1 and #2 (overlapping previous stent) using a SYNERGY XD 3.0X48.  Postdilated to 3.6 mm   Post intervention, there is a 0% residual stenosis in both these LESIONS #1 and #2.Marland Kitchen   LESION #3 Ost RCA to Prox RCA previously placed stents are 70% stenosed.   Scoring balloon angioplasty was performed using a BALLN SCOREFLEX 3.0X15 -> followed by post dilation with 3.5 mm x 15 mm [place] balloon. ->  Achieved 3.5 mm.   Post intervention, there is a 10% residual in-stent restenosis.   ---------------------------------   Lesion #4 Ost RCA lesion is 75% stenosed.   A drug-eluting stent was successfully placed at the ostium and overlapping previous stent proximally, using a SYNERGY XD 3.50X12.   Postdilated to 3.65 mm.   Post intervention, there is a 0% residual stenosis.   Post intervention, there is a 0% residual stenosis with exception of the 10% residual ISR in the previously placed stents which are likely undersized 2.75 mm stents. SUMMARY Successful Complext DES PCI of Ostial & mid to Distal RCA using Shockwave Lithotrypsy and ScoreFlex Scoring Balloon Angioplasty with Guideliner Extension Catheter. Mid-Distal RCA tandem lesions: Finally treated with distally overlapping SYNERGY XD (DES) 3.0X48 mm postdilated to 3.6 mm High-pressure score flex angioplasty followed by post dilation of previously placed ostial and proximal RCA stent dilating up to 3.5 mm-residual 10% Ostial RCA PCI with SYNERGY XD 3.5X12. ->  Deployed at 3.65 mm. --> All lesions segments reduced to 0% with exception of the in-stent restenosis portion which was 10%. --> TIMI-3 flow pre and post.   CUP PACEART REMOTE DEVICE CHECK  Result Date: 05/16/2021 Scheduled remote reviewed. Normal device function.  Known PAF, not a candidate for [facility] Next remote 91 days. Kathy Breach, RN, CCDS, [employer]  Disposition   Pt is being discharged home today in good condition.  Follow-up Plans & Appointments     Follow-up Information     Verta Ellen., NP Follow up on 06/06/2021.   Specialty: Cardiology Why: at [place] for your follow up appt Contact information: [address] [id number] [phone]                Discharge Instructions     Amb Referral to Cardiac Rehabilitation   Complete by: As directed    Diagnosis:  PTCA Coronary Stents     After initial evaluation and assessments completed: Virtual Based Care may be provided alone or in conjunction with Phase 2 Cardiac Rehab based on patient barriers.: Yes   Call MD for:  difficulty breathing, headache or visual disturbances   Complete by: As directed    Call MD for:  persistant dizziness or light-headedness   Complete by: As  directed    Call MD for:  redness, tenderness, or signs of infection (pain, swelling, redness, odor or green/yellow discharge around incision site)   Complete by: As directed    Diet - low sodium heart healthy   Complete by: As directed    Discharge instructions   Complete by: As directed    Radial Site Care Refer to this sheet in the next few weeks. These instructions provide you with information on caring for yourself after your procedure. Your caregiver may also give you more specific instructions. Your treatment  has been planned according to current medical practices, but problems sometimes occur. Call your caregiver if you have any problems or questions after your procedure. HOME CARE INSTRUCTIONS You may shower the day after the procedure. Remove the bandage (dressing) and gently wash the site with plain soap and water. Gently pat the site dry.  Do not apply powder or lotion to the site.  Do not submerge the affected site in water for 3 to 5 days.  Inspect the site at least twice daily.  Do not flex or bend the affected arm for 24 hours.  No lifting over 5 pounds (2.3 kg) for 5 days after your procedure.  Do not drive home if you are discharged the same day of the procedure. Have someone else drive you.  You may drive 24 hours after the procedure unless otherwise instructed by your caregiver.  What to expect: Any bruising will usually fade within 1 to 2 weeks.  Blood that collects in the tissue (hematoma) may be painful to the touch. It should usually decrease in size and tenderness within 1 to 2 weeks.  SEEK IMMEDIATE MEDICAL CARE IF: You have unusual pain at the radial site.  You have redness, warmth, swelling, or pain at the radial site.  You have drainage (other than a small amount of blood on the dressing).  You have chills.  You have a fever or persistent symptoms for more than 72 hours.  You have a fever and your symptoms suddenly get worse.  Your arm becomes pale, cool,  tingly, or numb.  You have heavy bleeding from the site. Hold pressure on the site.   PLEASE DO NOT MISS ANY DOSES OF YOUR PLAVIX!!!!! Also keep a log of you blood pressures and bring back to your follow up appt. Please call the office with any questions.   Patients taking blood thinners should generally stay away from medicines like ibuprofen, Advil, Motrin, naproxen, and Aleve due to risk of stomach bleeding. You may take Tylenol as directed or talk to your primary doctor about alternatives.   PLEASE ENSURE THAT YOU DO NOT RUN OUT OF YOUR PLAVIX. This medication is very important to remain on for at least one year. IF you have issues obtaining this medication due to cost please CALL the office 3-5 business days prior to running out in order to prevent missing doses of this medication.   Increase activity slowly   Complete by: As directed        Discharge Medications   Allergies as of 05/29/2021       Reactions   Amitriptyline Hcl Other (See Comments)   Caused "jaws to twist and lock"   Sulfonamide Derivatives Other (See Comments)   UNKNOWN REACTION        Medication List     STOP taking these medications    amLODipine 10 MG tablet Commonly known as: NORVASC       TAKE these medications    ALPRAZolam 1 MG tablet Commonly known as: XANAX Take 1 mg by mouth 3 (three) times daily as needed for anxiety.   amiodarone 200 MG tablet Commonly known as: PACERONE Take 200 mg Daily Monday - Saturday None on Sunday   aspirin EC 81 MG tablet Take 1 tablet (81 mg total) by mouth daily. Swallow whole.   atorvastatin 80 MG tablet Commonly known as: LIPITOR TAKE (1) TABLET BY MOUTH ONCE DAILY.   clopidogrel 75 MG tablet Commonly known as: PLAVIX Take 1 tablet (75 mg total)  by mouth daily.   folic acid 076 MCG tablet Commonly known as: FOLVITE Take 400 mcg by mouth daily.   furosemide 40 MG tablet Commonly known as: LASIX Take 1 tablet (40 mg total) by mouth daily as  needed for fluid or edema.   isosorbide mononitrate 30 MG 24 hr tablet Commonly known as: IMDUR Take 2 tablets (60 mg total) by mouth daily. What changed: how much to take   Jardiance 10 MG Tabs tablet Generic drug: empagliflozin Take 10 mg by mouth daily.   metoprolol succinate 25 MG 24 hr tablet Commonly known as: TOPROL-XL Take 3 tablets (75 mg total) by mouth in the morning. & 50 mg in the evening What changed:  how much to take when to take this additional instructions   multivitamin with minerals Tabs tablet Take 1 tablet by mouth daily.   nitroGLYCERIN 0.4 MG SL tablet Commonly known as: NITROSTAT Place 1 tablet (0.4 mg total) under the tongue every 5 (five) minutes x 3 doses as needed for chest pain.   ondansetron 4 MG tablet Commonly known as: ZOFRAN Take 4 mg by mouth every 6 (six) hours as needed for nausea or vomiting.   pantoprazole 40 MG tablet Commonly known as: PROTONIX TAKE (1) TABLET BY MOUTH ONCE DAILY.   potassium chloride SA 20 MEQ tablet Commonly known as: KLOR-CON Take 20 mEq by mouth See admin instructions. Takes 1 tablet (20 meq) by mouth daily (scheduled) & may take a second dose if she has a second dose of Lasix   primidone 50 MG tablet Commonly known as: MYSOLINE Take 50 mg by mouth at bedtime.   ProAir HFA 108 (90 Base) MCG/ACT inhaler Generic drug: albuterol Inhale 1 puff into the lungs every 4 (four) hours as needed for wheezing or shortness of breath.   promethazine 6.25 MG/5ML syrup Commonly known as: PHENERGAN Take 6.25 mg by mouth every 6 (six) hours as needed for nausea or vomiting.   ranolazine 500 MG 12 hr tablet Commonly known as: Ranexa Take 1 tablet (500 mg total) by mouth 2 (two) times daily.   senna-docusate 8.6-50 MG tablet Commonly known as: Senokot-S Take 1 tablet by mouth at bedtime. What changed:  when to take this reasons to take this   Vitamin B-12 5000 MCG Subl Take 5,000 mcg by mouth daily.   VITAMIN D  (CHOLECALCIFEROL) PO Take 1 tablet by mouth daily.        Outstanding Labs/Studies   N/a   Duration of Discharge Encounter   Greater than 30 minutes including physician time.  Signed, Reino Bellis, NP 05/29/2021, 3:53 PM

## 2021-05-29 NOTE — Telephone Encounter (Signed)
error 

## 2021-05-29 NOTE — Plan of Care (Signed)
  Problem: Education: Goal: Knowledge of General Education information will improve Description: Including pain rating scale, medication(s)/side effects and non-pharmacologic comfort measures Outcome: Completed/Met

## 2021-05-29 NOTE — Progress Notes (Signed)
Mobility Specialist Progress Note:   05/29/21 1200  Mobility  Activity Ambulated to bathroom  Level of Assistance Contact guard assist, steadying assist  Assistive Device None  Distance Ambulated (ft) 25 ft  Mobility Out of bed for toileting  Mobility Response Tolerated well  Mobility performed by Mobility specialist  $Mobility charge 1 Mobility   Pt received in bed requesting to use restroom. Ambulated to bathroom then requested to sit EOB to eat lunch. Left sitting EOB with call bell in reach and all needs met.   [facility] Health and safety inspector Phone [phone]

## 2021-05-29 NOTE — Care Management Obs Status (Signed)
[place] NOTIFICATION   Patient Details  Name: [name] MRN: [id number] Date of Birth: [birth year]   Medicare Observation Status Notification Given:  Yes    Bethena Roys, RN 05/29/2021, 12:46 PM

## 2021-05-29 NOTE — Progress Notes (Signed)
Pt asking to go to BR. Able to get up and furniture surf to bathroom then back to bed, standby assist. BP 133/68. Pt without c/o dizziness now. Does c/o nausea. Daughter is present but she doesn't live close. Pt manages her meds. Sts she will take her Plavix. Reminded her to keep NTG fresh. Pt is limited on food and ambulation. Will place CRPII referral but pt probably n/a. [id number] Yves Dill CES, ACSM 3:21 PM 05/29/2021

## 2021-05-29 NOTE — Progress Notes (Addendum)
Progress Note  Patient Name: [name] Date of Encounter: 05/29/2021  [facility] Cardiologist: Rozann Lesches, MD   Subjective   Feeling light-headed, just received all her morning BP meds. States she normally spaces them out during the day. No chest pain.   Inpatient Medications    Scheduled Meds:  amiodarone  200 mg Oral Once per day on Mon Tue Wed Thu Fri Sat   amLODipine  5 mg Oral Daily   aspirin EC  81 mg Oral Daily   atorvastatin  80 mg Oral Daily   clopidogrel  75 mg Oral Daily   empagliflozin  10 mg Oral Daily   [START ON 05/30/2021] isosorbide mononitrate  60 mg Oral Daily   metoprolol succinate  50 mg Oral QHS   metoprolol succinate  75 mg Oral q morning   pantoprazole  40 mg Oral Daily   potassium chloride SA  20 mEq Oral See admin instructions   primidone  50 mg Oral QHS   ranolazine  500 mg Oral BID   sodium chloride flush  3 mL Intravenous Q12H   sodium chloride flush  3 mL Intravenous Q12H   Continuous Infusions:  sodium chloride     PRN Meds: sodium chloride, acetaminophen, albuterol, ALPRAZolam, furosemide, morphine injection, nitroGLYCERIN, ondansetron, promethazine, senna-docusate, sodium chloride flush   Vital Signs    Vitals:   05/28/21 1823 05/28/21 2126 05/29/21 0521 05/29/21 0803  BP: (!) 174/77 140/64 109/67 128/85  Pulse: (!) 33 87 90 85  Resp: 16 15  17   Temp: (!) 97.5 F (36.4 C) 97.9 F (36.6 C) 98 F (36.7 C) 98.6 F (37 C)  TempSrc: Oral Oral Oral Oral  SpO2: 97% 95% 97% 97%  Weight:      Height:        Intake/Output Summary (Last 24 hours) at 05/29/2021 0947 Last data filed at 05/28/2021 1900 Gross per 24 hour  Intake 120 ml  Output 290 ml  Net -170 ml   Last 3 Weights 05/28/2021 05/20/2021 05/14/2021  Weight (lbs) 132 lb 133 lb 132 lb  Weight (kg) 59.875 kg 60.328 kg 59.875 kg  Some encounter information is confidential and restricted. Go to Review Flowsheets activity to see all data.      Telemetry     A paced - Personally Reviewed  ECG    A paced - Personally Reviewed  Physical Exam   GEN: No acute distress. Frail older female   Neck: No JVD Cardiac: RRR, no murmurs, rubs, or gallops.  Respiratory: Clear to auscultation bilaterally. GI: Soft, nontender, non-distended  MS: No edema; No deformity. Right femoral cath site stable.  Neuro:  Nonfocal  Psych: Normal affect   Labs    High Sensitivity Troponin:  No results for input(s): TROPONINIHS in the last 720 hours.   Chemistry Recent Labs  Lab 05/28/21 1048 05/29/21 0235  NA 138 137  K 4.6 3.7  CL 103 105  CO2 28 25  GLUCOSE 90 110*  BUN 10 11  CREATININE 0.74 0.64  CALCIUM 9.3 9.1  GFRNONAA >60 >60  ANIONGAP 7 7    Lipids No results for input(s): CHOL, TRIG, HDL, LABVLDL, LDLCALC, CHOLHDL in the last 168 hours.  Hematology Recent Labs  Lab 05/28/21 1048 05/29/21 0235  WBC 7.0 7.7  RBC 4.50 4.24  HGB 13.5 12.5  HCT 42.5 39.6  MCV 94.4 93.4  MCH 30.0 29.5  MCHC 31.8 31.6  RDW 13.9 14.1  PLT 254 212  Thyroid No results for input(s): TSH, FREET4 in the last 168 hours.  BNPNo results for input(s): BNP, PROBNP in the last 168 hours.  DDimer No results for input(s): DDIMER in the last 168 hours.   Radiology    CARDIAC CATHETERIZATION  Result Date: 05/28/2021   ---------- IVUS-guided PTCA/PCI: Combination SHOCKWAVE LITHOTRIPSY, & SCOREFLEX ANGIOPLASTY with GUIDELINER support-----------   LESION #1 prox RCA lesion is 85% stenosed distal to previous stent.   Scoring balloon angioplasty was performed following SHOCKWAVE LITHOTRIPSY (3.0 x 12) BALLN (2 runs of 10 pulses) using a BALLN SCOREFLEX 3.0X15.   Lesion #2 mid RCA to Dist RCA lesion is 65% stenosed.   Scoring balloon angioplasty was performed using a BALLN SCOREFLEX 3.0X15.  With Use of GuideLiner Catheter Extension   A drug-eluting stent was successfully placed covering both LESIONS #1 and #2 (overlapping previous stent) using a SYNERGY XD 3.0X48.   Postdilated to 3.6 mm   Post intervention, there is a 0% residual stenosis in both these LESIONS #1 and #2.Marland Kitchen   LESION #3 Ost RCA to Prox RCA previously placed stents are 70% stenosed.   Scoring balloon angioplasty was performed using a BALLN SCOREFLEX 3.0X15 -> followed by post dilation with 3.5 mm x 15 mm [place]ll balloon. ->  Achieved 3.5 mm.   Post intervention, there is a 10% residual in-stent restenosis.   ---------------------------------   Lesion #4 Ost RCA lesion is 75% stenosed.   A drug-eluting stent was successfully placed at the ostium and overlapping previous stent proximally, using a SYNERGY XD 3.50X12.  Postdilated to 3.65 mm.   Post intervention, there is a 0% residual stenosis.   Post intervention, there is a 0% residual stenosis with exception of the 10% residual ISR in the previously placed stents which are likely undersized 2.75 mm stents. SUMMARY Successful Complext DES PCI of Ostial & mid to Distal RCA using Shockwave Lithotrypsy and ScoreFlex Scoring Balloon Angioplasty with Guideliner Extension Catheter. Mid-Distal RCA tandem lesions: Finally treated with distally overlapping SYNERGY XD (DES) 3.0X48 mm postdilated to 3.6 mm High-pressure score flex angioplasty followed by post dilation of previously placed ostial and proximal RCA stent dilating up to 3.5 mm-residual 10% Ostial RCA PCI with SYNERGY XD 3.5X12. ->  Deployed at 3.65 mm. --> All lesions segments reduced to 0% with exception of the in-stent restenosis portion which was 10%. --> TIMI-3 flow pre and post.    Cardiac Studies   Cath: 05/28/21  ---------- IVUS-guided PTCA/PCI: Combination SHOCKWAVE LITHOTRIPSY, & SCOREFLEX ANGIOPLASTY with GUIDELINER support-----------   LESION #1 prox RCA lesion is 85% stenosed distal to previous stent.   Scoring balloon angioplasty was performed following SHOCKWAVE LITHOTRIPSY (3.0 x 12) BALLN (2 runs of 10 pulses) using a BALLN SCOREFLEX 3.0X15.   Lesion #2 mid RCA to Dist RCA lesion is 65%  stenosed.   Scoring balloon angioplasty was performed using a BALLN SCOREFLEX 3.0X15.  With Use of GuideLiner Catheter Extension   A drug-eluting stent was successfully placed covering both LESIONS #1 and #2 (overlapping previous stent) using a SYNERGY XD 3.0X48.  Postdilated to 3.6 mm   Post intervention, there is a 0% residual stenosis in both these LESIONS #1 and #2.Marland Kitchen   LESION #3 Ost RCA to Prox RCA previously placed stents are 70% stenosed.   Scoring balloon angioplasty was performed using a BALLN SCOREFLEX 3.0X15 -> followed by post dilation with 3.5 mm x 15 mm [place] balloon. ->  Achieved 3.5 mm.   Post intervention, there is a  10% residual in-stent restenosis.   ---------------------------------   Lesion #4 Ost RCA lesion is 75% stenosed.   A drug-eluting stent was successfully placed at the ostium and overlapping previous stent proximally, using a SYNERGY XD 3.50X12.  Postdilated to 3.65 mm.   Post intervention, there is a 0% residual stenosis.   Post intervention, there is a 0% residual stenosis with exception of the 10% residual ISR in the previously placed stents which are likely undersized 2.75 mm stents.   SUMMARY Successful Complext DES PCI of Ostial & mid to Distal RCA using Shockwave Lithotrypsy and ScoreFlex Scoring Balloon Angioplasty with Guideliner Extension Catheter. Mid-Distal RCA tandem lesions: Finally treated with distally overlapping SYNERGY XD (DES) 3.0X48 mm postdilated to 3.6 mm High-pressure score flex angioplasty followed by post dilation of previously placed ostial and proximal RCA stent dilating up to 3.5 mm-residual 10% Ostial RCA PCI with SYNERGY XD 3.5X12. ->  Deployed at 3.65 mm. --> All lesions segments reduced to 0% with exception of the in-stent restenosis portion which was 10%. --> TIMI-3 flow pre and post.   Diagnostic Dominance: Right Intervention     Patient Profile     85 y.o. female with PMH of CAD, atrial fib, COPD, HTN, GERD, tachy-brady  syndrome s/p PPM, HLD and neuromuscular disorder who presented for outpatient cardiac cath.   Assessment & Plan    CAD: Underwent cardiac catheterization noted above with successful complex PCI/DESx2 of ostial and mid to distal RCA with shockwave lithotripsy and scoring balloon angioplasty.  Recommendations for DAPT with aspirin/Plavix for at least 1 year, likely monotherapy with Plavix after that.  No recurrent chest pain overnight.  -- continue ASA/plavix, BB, statin, reduce Imdur (120-60mg  daily)  Hypertension: Blood pressures initially elevated, but received all of her BP meds this morning.  She is now feeling lightheaded and dizzy.  States she normally spaces her medications out throughout the day. --We will continue metoprolol 75 mg a.m., 50 mg p.m. --Reduce Imdur from 120 mg daily to 60 mg daily, stop norvasc 5mg  daily for now --Continue Ranexa 500 mg twice daily  Hyperlipidemia: On atorvastatin 80 mg daily  Paroxysmal atrial fibrillation: remains on amiodarone 200mg  daily. Not a candidate for [place] historically  HFpEF: Echo 8/22 with EF of 55-60%, no rWMA. Does not appear volume overloaded on exam -- continue BB, outpatient lasix -- on jardiance PTA   For questions or updates, please contact [place] Please consult [email or web address] for contact info under   Signed, Reino Bellis, NP  05/29/2021, 9:47 AM    Patient seen, examined. Available data reviewed. Agree with findings, assessment, and plan as outlined by Reino Bellis, NP.  Elderly woman in no distress.  Resting tremor noted.  Lungs clear, heart regular rate and rhythm with no murmur gallop, abdomen soft and nontender, right groin site clear, no lower extremity edema, skin warm and dry with no rash.  The patient had an uncomplicated PCI procedure yesterday.  Her EKG this morning shows atrial pacing with frequent PACs, no ischemic ST changes.  She appears medically stable.  However, she received all of her  antihypertensive medications this morning and is having some dizziness and low normal blood pressure at this time.  She does not normally take all of her blood pressure medicines at one time.  I think we should watch her until this afternoon and make sure that she is normotensive prior to discharge.  We will check again after lunch and evaluate disposition at that time.  She otherwise  appears clinically stable with no signs of bleeding or complication related to her procedure yesterday.  Sherren Mocha, M.D. 05/29/2021 10:16 AM

## 2021-05-29 NOTE — Progress Notes (Signed)
CARDIAC REHAB PHASE I   PRE:  Rate/Rhythm: 85 afib, pacing    BP: 90/55  Pt lying in bed. When she sat up, c/o her head feeling "funny". Laid back down and BP 90/55, which she sts is low for her. Doesn't feel well, seems SOB. Will hold ambulation for now.  [place], ACSM 05/29/2021 9:36 AM

## 2021-06-03 DIAGNOSIS — K219 Gastro-esophageal reflux disease without esophagitis: Secondary | ICD-10-CM | POA: Diagnosis not present

## 2021-06-03 DIAGNOSIS — E119 Type 2 diabetes mellitus without complications: Secondary | ICD-10-CM | POA: Diagnosis not present

## 2021-06-03 DIAGNOSIS — I10 Essential (primary) hypertension: Secondary | ICD-10-CM | POA: Diagnosis not present

## 2021-06-04 NOTE — Progress Notes (Addendum)
Cardiology Office Note  Date: 06/06/2021   ID: NATALEA SUTLIFF, DOB November 06, 1935, MRN [id number]  PCP:  Celene Squibb, MD  Cardiologist:  Rozann Lesches, MD Electrophysiologist:  [name], MD   Chief Complaint: Status post PCI RCA  History of Present Illness: [name] is a 85 y.o. female with a history of atrial fibrillation, COPD, CAD, HTN, GERD, tachybradycardia syndrome with dual-chamber pacemaker, HLD, neuromuscular disorder   She was last here for continued chest pain.  She saw Dr. Roxan Hockey on 05/14/2021 for refractory angina despite maximal medical therapy.  She was in the hospital in August and had a catheterization which showed three-vessel disease.  There were discussions regarding medical therapy versus CABG versus PCI to RCA.  She opted for medical therapy.  She wished to have an angioplasty but was sent for cardiac surgical evaluation.  Dr. Roxan Hockey mentioned although her anatomy was best suited for CABG she was a poor candidate for surgery.  He mentioned angioplasty of RCA was the best option.  Plan was to follow-up with Dr. Domenic Polite and Dr. Lovena Le.  She is here for follow-up of recent intervention to RCA for continued chest pain.  She had initially opted for medical treatment but had continued chest pain.  It was eventually decided she would undergo intervention to her RCA secondary to continued chest pain. She had PCI on 05/28/2021 to RCA with successful complex DES PCI of ostial and mid to distal RCA using shockwave lithotripsy and circumflex coronary balloon angioplasty with guide liner extension catheter.  Mid to distal RCA tandem lesions finally treated with distally overlapping Synergy XD DES 3.0 x 48 mm postdilated to 3.6 mm.  High-pressure circumflex angioplasty followed by post dilation previously placed ostial and proximal RCA stent dilating up to 3.5 mm residual 10%.  Ostial RCA PCI with Synergy XD 3.5 x 12.8 3.65 mm.  All lesions segments were reduced to 0%  with exception of ISR portion which was 10%.  Today she states he is feeling much better and denies any chest pain.  Her right groin site has some bruising and ecchymosis but does have good pulses.  She denies any pain or discomfort.  Good femoral pulse.  Blood pressure is slightly elevated today.  Her daughter states blood pressures are usually much better at home in the 120s over 70s usually after she takes her medications.   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   CATARACT EXTRACTION     CHOLECYSTECTOMY     COLONOSCOPY  2011   COLONOSCOPY N/A 10/19/2014  Procedure: COLONOSCOPY;  Surgeon: Rogene Houston, MD;  [facility];  Service: Endoscopy;  Laterality: N/A;  1030   CORONARY STENT INTERVENTION N/A 05/28/2021   Procedure: CORONARY STENT INTERVENTION;  Surgeon: Leonie Man, MD;  Location: [place] [facility];  Service: Cardiovascular;  Laterality: N/A;   ESOPHAGEAL DILATION N/A 05/02/2020   Procedure: ESOPHAGEAL DILATION;  Surgeon: Rogene Houston, MD;  [facility];  Service: Gastroenterology;  Laterality: N/A;   ESOPHAGOGASTRODUODENOSCOPY N/A 10/26/2014   Procedure: ESOPHAGOGASTRODUODENOSCOPY  (EGD);  Surgeon: Rogene Houston, MD;  [facility];  Service: Endoscopy;  Laterality: N/A;  22 - Dr. has lunch and learn   ESOPHAGOGASTRODUODENOSCOPY (EGD) WITH PROPOFOL N/A 05/02/2020   Procedure: ESOPHAGOGASTRODUODENOSCOPY (EGD) WITH PROPOFOL;  Surgeon: Rogene Houston, MD;  [facility];  Service: Gastroenterology;  Laterality: N/A;  250   Esophagogastroduodenoscopy with esophageal dilation  2004, 2006, 2007   GIVENS CAPSULE STUDY N/A 10/31/2014   Procedure: GIVENS CAPSULE STUDY;  Surgeon: Rogene Houston, MD;  [facility];  Service: Endoscopy;  Laterality: N/A;  730 -- pacemaker--needs monitoring--outpatient bed   INSERT / REPLACE / REMOVE PACEMAKER  2010   INTRAVASCULAR ULTRASOUND/IVUS N/A 05/28/2021   Procedure: Intravascular Ultrasound/IVUS;  Surgeon: Leonie Man, MD;  Location: [place] [facility];  Service: Cardiovascular;  Laterality: N/A;   LEFT HEART CATH AND CORONARY ANGIOGRAPHY N/A 03/21/2021   Procedure: LEFT HEART CATH AND CORONARY ANGIOGRAPHY;  Surgeon: Belva Crome, MD;  Location: [place] [facility];  Service: Cardiovascular;  Laterality: N/A;   LEFT HEART CATHETERIZATION WITH CORONARY ANGIOGRAM N/A 11/17/2011   Procedure: LEFT HEART CATHETERIZATION WITH CORONARY ANGIOGRAM;  Surgeon: Sherren Mocha, MD;  Location: [facility] C[facility];  Service: Cardiovascular;  Laterality: N/A;   LEFT HEART CATHETERIZATION WITH CORONARY ANGIOGRAM N/A 09/26/2014   Procedure: LEFT HEART CATHETERIZATION WITH CORONARY ANGIOGRAM;  Surgeon: Leonie Man, MD;  Location: [facility];  Service: Cardiovascular;  Laterality: N/A;   Right rotator cuff repair     SHOULDER ACROMIOPLASTY Right 05/30/2015   Procedure: RIGHT SHOULDER ACROMIOPLASTY;  Surgeon: Carole Civil, MD;  Location: [facility];  Service: Orthopedics;  Laterality: Right;   SHOULDER OPEN ROTATOR CUFF REPAIR Right 05/30/2015   Procedure: OPEN ROTATOR CUFF REPAIR RIGHT SHOULDER;  Surgeon: Carole Civil, MD;   Location: [facility];  Service: Orthopedics;  Laterality: Right;   SHOULDER OPEN ROTATOR CUFF REPAIR Left 10/22/2016   Procedure: ROTATOR CUFF REPAIR SHOULDER OPEN;  Surgeon: Carole Civil, MD;  Location: [facility];  Service: Orthopedics;  Laterality: Left;   Subxiphoid pericardial window  11/10   VAGINAL HYSTERECTOMY      Current Outpatient Medications  Medication Sig Dispense Refill   ALPRAZolam (XANAX) 1 MG tablet Take 1 mg by mouth 3 (three) times daily as needed for anxiety.     amiodarone (PACERONE) 200 MG tablet Take 200 mg Daily Monday - Saturday None on Sunday 90 tablet 3   aspirin EC 81 MG tablet Take 1 tablet (81 mg total) by mouth daily. Swallow whole. 90 tablet 3   atorvastatin (LIPITOR) 80 MG tablet TAKE (1) TABLET BY MOUTH ONCE DAILY. 90 tablet 2   clopidogrel (PLAVIX) 75 MG tablet Take 1 tablet (75 mg total) by mouth daily. 30 tablet 0   Cyanocobalamin (VITAMIN B-12) 5000 MCG SUBL Take 5,000 mcg by mouth daily.     folic acid (FOLVITE) 379 MCG tablet Take 400 mcg by mouth daily.  furosemide (LASIX) 40 MG tablet Take 1 tablet (40 mg total) by mouth daily as needed for fluid or edema. 30 tablet 0   isosorbide mononitrate (IMDUR) 30 MG 24 hr tablet Take 2 tablets (60 mg total) by mouth daily. 180 tablet 0   JARDIANCE 10 MG TABS tablet Take 10 mg by mouth daily.     metoprolol succinate (TOPROL-XL) 25 MG 24 hr tablet Take 3 tablets (75 mg total) by mouth in the morning. & 50 mg in the evening (Patient taking differently: Take 50-75 mg by mouth See admin instructions. Take 3 tablets (75 mg) by mouth in the morning & take 2 tablets (50 mg) by mouth  in the evening) 450 tablet 3   Multiple Vitamin (MULTIVITAMIN WITH MINERALS) TABS tablet Take 1 tablet by mouth daily.      nitroGLYCERIN (NITROSTAT) 0.4 MG SL tablet Place 1 tablet (0.4 mg total) under the tongue every 5 (five) minutes x 3 doses as needed for chest pain. 30 tablet 12   ondansetron (ZOFRAN) 4 MG tablet Take 4 mg by mouth  every 6 (six) hours as needed for nausea or vomiting.      pantoprazole (PROTONIX) 40 MG tablet TAKE (1) TABLET BY MOUTH ONCE DAILY. 90 tablet 3   potassium chloride SA (K-DUR,KLOR-CON) 20 MEQ tablet Take 20 mEq by mouth See admin instructions. Takes 1 tablet (20 meq) by mouth daily (scheduled) & may take a second dose if she has a second dose of Lasix     primidone (MYSOLINE) 50 MG tablet Take 50 mg by mouth at bedtime.     PROAIR HFA 108 (90 Base) MCG/ACT inhaler Inhale 1 puff into the lungs every 4 (four) hours as needed for wheezing or shortness of breath.      promethazine (PHENERGAN) 6.25 MG/5ML syrup Take 6.25 mg by mouth every 6 (six) hours as needed for nausea or vomiting.     ranolazine (RANEXA) 500 MG 12 hr tablet Take 1 tablet (500 mg total) by mouth 2 (two) times daily. 60 tablet 11   senna-docusate (SENOKOT-S) 8.6-50 MG tablet Take 1 tablet by mouth at bedtime. (Patient taking differently: Take 1 tablet by mouth daily as needed (constipation.).) 30 tablet 0   VITAMIN D, CHOLECALCIFEROL, PO Take 1 tablet by mouth daily.     No current facility-administered medications for this visit.   Allergies:  Amitriptyline hcl and Sulfonamide derivatives   Social History: The patient  reports that she quit smoking about 32 years ago. Her smoking use included cigarettes. She has a 20.00 pack-year smoking history. She has never used smokeless tobacco. She reports that she does not drink alcohol and does not use drugs.   Family History: The patient's family history includes Arthritis in an other family member; Asthma in an other family member; Cancer in her mother; Coronary artery disease in her brother and sister; Lung disease in an other family member.   ROS:  Please see the history of present illness. Otherwise, complete review of systems is positive for none.  All other systems are reviewed and negative.   Physical Exam: VS:  BP (!) 146/84   Pulse 64   Ht 5\' 5"  (1.651 m)   Wt 133 lb (60.3  kg)   SpO2 98%   BMI 22.13 kg/m , BMI Body mass index is 22.13 kg/m.  Wt Readings from Last 3 Encounters:  06/06/21 133 lb (60.3 kg)  05/28/21 132 lb (59.9 kg)  05/20/21 133 lb (60.3 kg)  General: Patient appears comfortable at rest. Neck: Supple, no elevated JVP or carotid bruits, no thyromegaly. Lungs: Clear to auscultation, nonlabored breathing at rest. Cardiac: Irregularly irregular rate and rhythm, no S3 or significant systolic murmur, no pericardial rub. Extremities: No pitting edema, distal pulses 2+. Skin: Warm and dry.  Right groin access site with some bruising and ecchymosis.  Good femoral pulse. Musculoskeletal: No kyphosis. Neuropsychiatric: Alert and oriented x3, affect grossly appropriate.  ECG: EKG 05/28/2021 atrial flutter rate of 74 with variable AV block  Recent Labwork: 03/19/2021: ALT 20; AST 28 03/20/2021: Magnesium 2.2 03/30/2021: B Natriuretic Peptide 253.0 05/29/2021: BUN 11; Creatinine, Ser 0.64; Hemoglobin 12.5; Platelets 212; Potassium 3.7; Sodium 137     Component Value Date/Time   CHOL 200 03/20/2021 1605   TRIG 130 03/20/2021 1605   HDL 49 03/20/2021 1605   CHOLHDL 4.1 03/20/2021 1605   VLDL 26 03/20/2021 1605   LDLCALC 125 (H) 03/20/2021 1605    Other Studies Reviewed Today:  Cardiac catheterization with PCI 05/28/2021 CORONARY STENT INTERVENTION  Intravascular Ultrasound/IVUS   Conclusion      ---------- IVUS-guided PTCA/PCI: Combination SHOCKWAVE LITHOTRIPSY, & SCOREFLEX ANGIOPLASTY with GUIDELINER support-----------   LESION #1 prox RCA lesion is 85% stenosed distal to previous stent.   Scoring balloon angioplasty was performed following SHOCKWAVE LITHOTRIPSY (3.0 x 12) BALLN (2 runs of 10 pulses) using a BALLN SCOREFLEX 3.0X15.   Lesion #2 mid RCA to Dist RCA lesion is 65% stenosed.   Scoring balloon angioplasty was performed using a BALLN SCOREFLEX 3.0X15.  With Use of GuideLiner Catheter Extension   A drug-eluting stent was  successfully placed covering both LESIONS #1 and #2 (overlapping previous stent) using a SYNERGY XD 3.0X48.  Postdilated to 3.6 mm   Post intervention, there is a 0% residual stenosis in both these LESIONS #1 and #2.Marland Kitchen   LESION #3 Ost RCA to Prox RCA previously placed stents are 70% stenosed.   Scoring balloon angioplasty was performed using a BALLN SCOREFLEX 3.0X15 -> followed by post dilation with 3.5 mm x 15 mm [place] balloon. ->  Achieved 3.5 mm.   Post intervention, there is a 10% residual in-stent restenosis.   ---------------------------------   Lesion #4 Ost RCA lesion is 75% stenosed.   A drug-eluting stent was successfully placed at the ostium and overlapping previous stent proximally, using a SYNERGY XD 3.50X12.  Postdilated to 3.65 mm.   Post intervention, there is a 0% residual stenosis.   Post intervention, there is a 0% residual stenosis with exception of the 10% residual ISR in the previously placed stents which are likely undersized 2.75 mm stents.   SUMMARY Successful Complext DES PCI of Ostial & mid to Distal RCA using Shockwave Lithotrypsy and ScoreFlex Scoring Balloon Angioplasty with Guideliner Extension Catheter. Mid-Distal RCA tandem lesions: Finally treated with distally overlapping SYNERGY XD (DES) 3.0X48 mm postdilated to 3.6 mm High-pressure score flex angioplasty followed by post dilation of previously placed ostial and proximal RCA stent dilating up to 3.5 mm-residual 10% Ostial RCA PCI with SYNERGY XD 3.5X12. ->  Deployed at 3.65 mm. --> All lesions segments reduced to 0% with exception of the in-stent restenosis portion which was 10%. --> TIMI-3 flow pre and post.     Diagnostic Dominance: Right Intervention            LEFT HEART CATH AND CORONARY ANGIOGRAPHY     03/21/2021   Conclusion      Severe calcified diffuse three-vessel coronary artery disease.  Right dominant  anatomy.   Nonobstructive left main   Ostial 75% calcified LAD with tandem mid  80% and distal 80% stenoses.   Diffusely diseased circumflex with 75% proximal to mid first obtuse marginal.  Mid to distal circumflex 70% before small second obtuse marginal.   Dominant right coronary with previously placed proximal stent having diffuse 50% ISR, 80 to 90% segmental mid stenosis (probable culprit), and continuation of segmental 60% stenosis to the distal segment where the artery opens up to a 3.0 diameter.  PDA and LV branch are large.   Normal LVEDP.  EF 65%.   RECOMMENDATIONS: Medical therapy versus CABG versus elevated risk RCA PCI.  Consider heart team approach after conversation with the patient and family about level of aggressiveness that they feel is appropriate. Aggressive risk factor modification and anti-ischemic therapy. If RCA PCI, would recommend femoral approach rather than radial for better catheter support during the intervention. Diagnostic Dominance: Right       Echocardiogram 03/20/2021   1. Left ventricular ejection fraction, by estimation, is 55 to 60%. The  left ventricle has normal function. The left ventricle has no regional  wall motion abnormalities. Left ventricular diastolic parameters are  consistent with Grade II diastolic  dysfunction (pseudonormalization). Elevated left ventricular end-diastolic  pressure.   2. Right ventricular systolic function is normal. The right ventricular  size is normal. There is normal pulmonary artery systolic pressure.   3. Left atrial size was moderately dilated.   4. The mitral valve is degenerative. Mild mitral valve regurgitation. No  evidence of mitral stenosis.   5. There is partial fusion of the left and non-coronary cusps. The aortic  valve is calcified. There is mild calcification of the aortic valve. There  is mild thickening of the aortic valve. Aortic valve regurgitation is  mild. Mild aortic valve sclerosis  is present, with no evidence of aortic valve stenosis. Aortic  regurgitation PHT  measures 582 msec. Aortic valve area, by VTI measures  2.49 cm. Aortic valve mean gradient measures 3.5 mmHg. Aortic valve Vmax  measures 1.23 m/s.   6. Aortic dilatation noted. There is mild dilatation of the aortic root,  measuring 37 mm. There is mild dilatation of the ascending aorta,  measuring 38 mm.   7. The inferior vena cava is normal in size with greater than 50%  respiratory variability, suggesting right atrial pressure of 3 mmHg.    CT angio chest/abdomen/pelvis to rule out aortic dissection 03/20/2021 IMPRESSION: No evidence of thoracoabdominal aortic aneurysm or dissection.   11 mm low-density lesion in the pancreatic head, new. This is poorly evaluated on CT. Follow-up MRI abdomen with/without contrast is suggested in 4 weeks (on an outpatient basis).     Assessment and Plan:  1. CAD in native artery   2. Essential hypertension, benign   3. Mixed hyperlipidemia   4. Paroxysmal atrial fibrillation (HCC)   5. Tachycardia-bradycardia syndrome ([place])   6. Chronic diastolic heart failure ([place])      1.  CAD in native artery Recent PCI to RCA with relief of chest pain.  See cath report above.  Denies any current anginal symptoms. She is status post PCI to RCA.  See cath report above.  Denies any current chest pain.  Continue Plavix 75 mg daily, continue aspirin 81 mg daily.  Continue Imdur 60 mg daily.  Continue Ranexa 500 mg p.o. twice daily.  Continue Toprol-XL 75 mg a.m. and 50 mg p.m.  Continue sublingual nitroglycerin.  3. Essential hypertension, benign  BP 146/84 today.  Daughter states blood pressures are much better at home..    4. Mixed hyperlipidemia Continue atorvastatin 80 mg p.o. daily.  5. Paroxysmal atrial fibrillation (HCC) Continue amiodarone 200 mg daily Monday through Saturday none on Sunday..  Continue Toprol XL 75 mg a.m. and 50 mg p.m.  Rate control today at 71.  Not on [place].  On DAPT therapy with aspirin and Plavix.  6. Tachycardia-bradycardia  syndrome ([place]) Medtronic pacemaker in place.  Recent remote device check normal device function, PAF, not a candidate for [facility] Dr. Lovena Le.  7. Chronic diastolic heart failure ([place]) Most recent echocardiogram 03/20/2021 with EF of 55 to 60%.  No WMA's, G2 DD.  LA moderately dilated, mild MR.  Mild aortic regurgitation, mild dilation of aortic root measuring 37 mm.  Mild dilation of ascending aorta measuring 38 mm.  Continue Toprol-XL 75 mg daily a.m. and 50 mg p.m., continue Lasix 40 mg daily as needed for fluid or edema..  Continue potassium supplementation.  Continue Jardiance 10 mg daily.    Medication Adjustments/Labs and Tests Ordered: Current medicines are reviewed at length with the patient today.  Concerns regarding medicines are outlined above.   Disposition: Follow-up with Dr. Domenic Polite as planned  Signed, Levell July, NP 06/06/2021 9:55 AM    [facility] [place], [place], [place] [address] Phone: [phone]; Fax: [phone]

## 2021-06-05 ENCOUNTER — Other Ambulatory Visit: Payer: Self-pay | Admitting: *Deleted

## 2021-06-05 NOTE — Patient Outreach (Signed)
[facility]) Care Management  06/05/2021  RICCI PAFF [birth year] [id number]   Telephone Assessment-Successful-MI  Spoke with pt and niece who confirm the follow up appointment on tomorrow. Plan of care discussed and updated accordingly with pt's progress.   Will follow up next month with pt's ongoing discussed plan of care. Will also update the provider on pt's ongoing management of care.  Goals Addressed             This Visit's Progress    COMPLETED: [facility]) Patient will verbalize continuation of monitoring her B/P daily for the next 90 days       Timeframe:  Long-Range Goal Priority:  High Start Date: 05/07/20                            Expected End Date: 05/07/21                      Follow Up Date 06/02/21   - check blood pressure daily - write blood pressure results in a log or diary  Encouraged patient to limit her salt intake. -Discussed for patient to review color zones and rescue action plans located in her [facility] calendar booklet   Why is this important?   You won't feel high blood pressure, but it can still hurt your blood vessels.  High blood pressure can cause heart or kidney problems. It can also cause a stroke.  Making lifestyle changes like losing a little weight or eating less salt will help.  Checking your blood pressure at home and at different times of the day can help to control blood pressure.  If the doctor prescribes medicine remember to take it the way the doctor ordered.  Call the office if you cannot afford the medicine or if there are questions about it.     Notes: Patient received the scale, B/P cuff, Hypertension and CHF education. Patient states she is taking her weight and B/P daily but is unable to write down her daily values due to severe tremors. Nurse reviewed signs and symptoms action plans and zones. Updated: 07/18/20  Updated 10/02/20: Patient reports continuation of taking her weight, pulse and B/P daily. She reports that  her SOB has improved and her B/P and weight remain stable. Nurse reviewed signs and symptoms, action plans, and zones.  Updated 12/07/20: Patient continues to take her B/P and pulse daily. She states her B/P values have improved with her systolic values typically not getting higher than 150 unless she gets upset or nervous.   Updated 02/11/21: Patient states that she continues to take her B/P daily. She reports that her values have improved and typically are in the 140's/70's range. Patient explains that she is feeling better now that her B/P is in better control.      COMPLETED: [facility]) Patient will verbalize continuation of tracking or managing her heart failure within the next 90 days       Timeframe:  Long-Range Goal Priority:  High Start Date: 05/07/20                            Expected End Date: 05/07/21                    Follow Up Date 06/02/21   - eat more whole grains, fruits and vegetables, lean meats and healthy fats - follow rescue plan  if symptoms flare-up - know when to call the doctor - track symptoms and what helps feel better or worse  -Encouraged patient to limit her salt intake. -Discussed for patient to review color zones and rescue action plans located in her [facility] calendar booklet   Why is this important?   You will be able to handle your symptoms better if you keep track of them.  Making some simple changes to your lifestyle will help.  Eating healthy is one thing you can do to take good care of yourself.    Notes: Patient received the scale, B/P cuff, Hypertension and CHF education. Patient states she is taking her weight and B/P daily but is unable to write down her daily values due to severe tremors. Nurse reviewed signs and symptoms, action plans and zones. Updated: 07/18/20  Updated 10/02/20: Patient reports continuation of taking her weight, pulse and B/P daily. She reports that her SOB has improved and her B/P and weight remain stable. Nurse reviewed signs and  symptoms, action plans, and zones.  Updated 12/07/20: Patient reports that she does take her weight daily and her weight has remained stable. She is monitoring for symptoms of CHF and nurse reviewed rescue action plans with patient.   Updated 02/11/21: Patient continues to weigh, monitor her B/P, and pulse daily. She explains that her SOB and BLE swelling has improved. Currently the patient states she is feeling much better and reports that her pacemaker is functioning well.     COMPLETED: ([facility]) Remove Barriers to Better Nutrition       Timeframe:  Long-Range Goal Priority:  High Start Date: 07/18/20                            Expected End Date: 07/18/21                     Follow Up Date 07/03/21   - arrange meal delivery service like Meals on Wheels - ask for help with cooking and shopping - make food easier to swallow by adding gravy or drinking water between bites - take my supplements as prescribed  -Encouraged patient to continue to drink Ensure 2-3 times daily   Why is this important?   Eating healthy is not always easy.  You may not feel hungry or food might not taste good.  It may be a lot of work to shop or make meals.  There are things you can do to make it easier.    Notes: Patient states she forgot to order her Mom's meals on 07/10/20 and she is not receiving any delivery. Nurse will inform SW about the Mom's meals issue and see if she can call to restart food delivery. Patient is followed by Dr. Laural Golden for her swallowing difficulties. Encouraged patient to eat soft foods that are easy to swallow and to drink water in between bites. Patient supplements with Ensure and nurse will send her Ensure coupons.   Updated 10/02/20: Patient reports that she is swallowing better. She states that MOM's meals will run out within the next month. Nurse will contact community resource guide to inquire if  Mom's meals can continue while the patient waits to receive Meals On Wheels. Nurse will send  patient Ensure coupons.  Updated 12/07/20: Patient states that MOM's meals will continue until Meals on Wheels begin which may be another 6 month wait time. Patient continues to supplement with Ensure 2-3 times daily.  Nurse will send patient Ensure coupons.   Updated 02/11/21: Patient reports that MOM's meals continues to deliver her food. Her swallowing is better since her esophagus dilation but at times becomes more difficult. She continues to drink [employer] brand nutritional supplement because it is more affordable.      [facility]--Make and Keep All Appointments   On track    Timeframe:  Short-Term Goal Priority:  Medium Start Date:    03/25/2021                         Expected End Date:  08/02/2021  Follow Up Date: 07/05/2021  Barriers: Health Behaviors Knowledge                        - ask family or friend for a ride - call to cancel if needed - keep a calendar with appointment dates    Why is this important?   Part of staying healthy is seeing the doctor for follow-up care.  If you forget your appointments, there are some things you can do to stay on track.    Notes:  11/2- Recent procedure and pt continue to attend all follow up appointments with the assistance from her niece April. RN able to verify Niece (April) will be providing pt transportation services to her cardiologist on tomorrow. 9/28-Reports caregiver April (niece) continue to help within the home with all ADL and provides transportation to all her medical appointments.No missed appointments. Will continue to monitor due to her recent medical issues.  8/22- Discussed with daughter [name] all pending appointments post d/c orders and verified pt has sufficient transportation to all her pending appointments. Pt's niece April is also a caregiver and assist the pt with her pill box refills and transporting pt to all her medical appointments. In addition to running needed errands.      [facility]--Matintain My Quality of Life   On track     Timeframe:  Long-Range Goal Priority:  Medium Start Date:  03/25/2021                           Expected End Date:    10/01/2020                   Follow Up Date: 07/05/2021    - complete a living will - do one enjoyable thing every day - make shared treatment decisions with doctor - name a health care proxy (decision maker) - spend time outdoors at least 3 times a week - strengthen or fix relationships with loved ones  Barriers: Health Behaviors Knowledge    Why is this important?   Having a long-term illness can be scary.  It can also be stressful for you and your caregiver.  These steps may help.    Notes:  11/2-Reports she received the A.D and continues to completed with with family involvement. Continues to work on the relationship with her family (April and [place]). 8/22-Discussed healthy habits to improve quality of life and all goals with interventions noted above. Stress the importance of seek medical attention when needed with any acute symptoms of encounters.         [name], RN Care Management Coordinator [place] Office [phone]

## 2021-06-06 ENCOUNTER — Ambulatory Visit (INDEPENDENT_AMBULATORY_CARE_PROVIDER_SITE_OTHER): Payer: Medicare Other | Admitting: Family Medicine

## 2021-06-06 ENCOUNTER — Encounter: Payer: Self-pay | Admitting: Family Medicine

## 2021-06-06 VITALS — BP 146/84 | HR 64 | Ht 65.0 in | Wt 133.0 lb

## 2021-06-06 DIAGNOSIS — I10 Essential (primary) hypertension: Secondary | ICD-10-CM

## 2021-06-06 DIAGNOSIS — I480 Paroxysmal atrial fibrillation: Secondary | ICD-10-CM | POA: Diagnosis not present

## 2021-06-06 DIAGNOSIS — I5032 Chronic diastolic (congestive) heart failure: Secondary | ICD-10-CM

## 2021-06-06 DIAGNOSIS — I208 Other forms of angina pectoris: Secondary | ICD-10-CM

## 2021-06-06 DIAGNOSIS — I495 Sick sinus syndrome: Secondary | ICD-10-CM

## 2021-06-06 DIAGNOSIS — E782 Mixed hyperlipidemia: Secondary | ICD-10-CM

## 2021-06-06 DIAGNOSIS — I2510 Atherosclerotic heart disease of native coronary artery without angina pectoris: Secondary | ICD-10-CM | POA: Diagnosis not present

## 2021-06-06 NOTE — Patient Instructions (Addendum)
Medication Instructions:  Your physician recommends that you continue on your current medications as directed. Please refer to the Current Medication list given to you today.  Labwork: none  Testing/Procedures: none  Follow-Up: Your physician recommends that you schedule a follow-up appointment in: as planned  Any Other Special Instructions Will Be Listed Below (If Applicable).  If you need a refill on your cardiac medications before your next appointment, please call your pharmacy.

## 2021-06-14 DIAGNOSIS — R7301 Impaired fasting glucose: Secondary | ICD-10-CM | POA: Diagnosis not present

## 2021-06-14 DIAGNOSIS — E782 Mixed hyperlipidemia: Secondary | ICD-10-CM | POA: Diagnosis not present

## 2021-06-19 DIAGNOSIS — Z0001 Encounter for general adult medical examination with abnormal findings: Secondary | ICD-10-CM | POA: Diagnosis not present

## 2021-06-19 DIAGNOSIS — I10 Essential (primary) hypertension: Secondary | ICD-10-CM | POA: Diagnosis not present

## 2021-06-19 DIAGNOSIS — R809 Proteinuria, unspecified: Secondary | ICD-10-CM | POA: Diagnosis not present

## 2021-06-19 DIAGNOSIS — R7301 Impaired fasting glucose: Secondary | ICD-10-CM | POA: Diagnosis not present

## 2021-06-19 DIAGNOSIS — I480 Paroxysmal atrial fibrillation: Secondary | ICD-10-CM | POA: Diagnosis not present

## 2021-06-19 DIAGNOSIS — H9190 Unspecified hearing loss, unspecified ear: Secondary | ICD-10-CM | POA: Insufficient documentation

## 2021-06-19 DIAGNOSIS — E782 Mixed hyperlipidemia: Secondary | ICD-10-CM | POA: Diagnosis not present

## 2021-06-19 DIAGNOSIS — G250 Essential tremor: Secondary | ICD-10-CM | POA: Diagnosis not present

## 2021-06-19 DIAGNOSIS — K8689 Other specified diseases of pancreas: Secondary | ICD-10-CM | POA: Diagnosis not present

## 2021-06-19 DIAGNOSIS — R1310 Dysphagia, unspecified: Secondary | ICD-10-CM | POA: Diagnosis not present

## 2021-06-19 DIAGNOSIS — H9193 Unspecified hearing loss, bilateral: Secondary | ICD-10-CM | POA: Diagnosis not present

## 2021-06-24 ENCOUNTER — Encounter (INDEPENDENT_AMBULATORY_CARE_PROVIDER_SITE_OTHER): Payer: Self-pay

## 2021-06-25 ENCOUNTER — Ambulatory Visit (INDEPENDENT_AMBULATORY_CARE_PROVIDER_SITE_OTHER): Payer: Medicare Other | Admitting: Internal Medicine

## 2021-06-26 DIAGNOSIS — H02135 Senile ectropion of left lower eyelid: Secondary | ICD-10-CM | POA: Diagnosis not present

## 2021-06-26 DIAGNOSIS — H02132 Senile ectropion of right lower eyelid: Secondary | ICD-10-CM | POA: Diagnosis not present

## 2021-06-26 DIAGNOSIS — Z961 Presence of intraocular lens: Secondary | ICD-10-CM | POA: Diagnosis not present

## 2021-06-29 DIAGNOSIS — R0602 Shortness of breath: Secondary | ICD-10-CM | POA: Diagnosis not present

## 2021-06-29 DIAGNOSIS — I2510 Atherosclerotic heart disease of native coronary artery without angina pectoris: Secondary | ICD-10-CM | POA: Diagnosis not present

## 2021-07-01 ENCOUNTER — Other Ambulatory Visit: Payer: Self-pay

## 2021-07-01 ENCOUNTER — Ambulatory Visit (INDEPENDENT_AMBULATORY_CARE_PROVIDER_SITE_OTHER): Payer: Self-pay | Admitting: Internal Medicine

## 2021-07-01 ENCOUNTER — Encounter (INDEPENDENT_AMBULATORY_CARE_PROVIDER_SITE_OTHER): Payer: Self-pay | Admitting: Internal Medicine

## 2021-07-01 VITALS — BP 161/82 | HR 84 | Temp 98.2°F | Ht 65.0 in | Wt 135.2 lb

## 2021-07-01 DIAGNOSIS — R1030 Lower abdominal pain, unspecified: Secondary | ICD-10-CM

## 2021-07-01 DIAGNOSIS — K219 Gastro-esophageal reflux disease without esophagitis: Secondary | ICD-10-CM

## 2021-07-01 DIAGNOSIS — K869 Disease of pancreas, unspecified: Secondary | ICD-10-CM

## 2021-07-01 MED ORDER — METAMUCIL SMOOTH TEXTURE 58.6 % PO POWD: 1.0000 | Freq: Every day | ORAL | Status: DC

## 2021-07-01 MED ORDER — DICYCLOMINE HCL 10 MG PO CAPS: 10.0000 mg | ORAL_CAPSULE | Freq: Every day | ORAL | 1 refills | Status: DC

## 2021-07-01 NOTE — Progress Notes (Addendum)
Presenting complaint;  Lower abdominal pain. Recent finding of pancreatic lesion on CT and ultrasound.  Database and subjective:  Patient is 85 year old Caucasian female who is here for scheduled visit.  She was last seen in the office 1 year ago. She has chronic GERD, esophageal dysphagia felt to be secondary to esophageal motility disorder but she responded to esophageal dilation in September 2021.  She also has chronic right lower quadrant abdominal pain felt to be referred pain from her back as well as history of IBS. Patient presents with lower abdominal pain and recent finding of pancreatic lesion on 2 imaging studies.  SRS pancreatic lesion is concerned she was seen in emergency room on 03/19/2021 because of elevated blood pressure recorded on her home device.  Work-up in emergency room included CTA chest abdomen pelvis which revealed 11 mm hypodense lesion in pancreatic head.  No other abnormality noted per pancreas.  That admission was pertinent for markedly elevated troponin levels and she was felt to have non-STEMI. She could not have an MR because she has a pacemaker.  Therefore she underwent abdominal ultrasound on 05/15/2021 which revealed 15 x 13 x 11 mm hypoechoic lesion in pancreatic head.    *Lower abdominal pain is concerned it only occurs every morning when she has a bowel movement.  She generally has 1 formed stool daily.  At times she noticed thin caliber at other times caliber is normal.  Pain is relieved once she is through with defecation.  She has not experienced melena or rectal bleeding.  She continues to complain of nausea with sporadic vomiting. She also complains of right lower quadrant pain which radiates into her back.  She has had this pain for many years and has undergone work-up in the past. She denies vaginal bleeding or discharge.  She reports occasional hematuria.  Patient says heartburn is well controlled with PPI.  Now she is on pantoprazole.  She takes  pantoprazole daily before breakfast.  She says she takes Plavix in the morning as well. She says she has been able to swallow much better since her last esophageal dilation of September 2021. Patient says her appetite is fair.  She has not lost any weight since her last visit of 1 year. She takes alprazolam at bedtime so she can rest.  She continues to experience intermittent chest pain.  She was therefore admitted to [facility] and underwent cardiac cath on 05/28/2021 with balloon angio plasty as well as stenting which is reviewed below.  She has not experienced chest pain since then.  Family history is negative for pancreatic carcinoma.  It is positive for colon cancer in her mother who was in her 58s at the time of diagnosis and lived to be in the 52s.   Current Medications: Outpatient Encounter Medications as of 07/01/2021  Medication Sig   ALPRAZolam (XANAX) 1 MG tablet Take 1 mg by mouth 3 (three) times daily as needed for anxiety.   amiodarone (PACERONE) 200 MG tablet Take 200 mg Daily Monday - Saturday None on Sunday   aspirin EC 81 MG tablet Take 1 tablet (81 mg total) by mouth daily. Swallow whole.   atorvastatin (LIPITOR) 80 MG tablet TAKE (1) TABLET BY MOUTH ONCE DAILY.   clopidogrel (PLAVIX) 75 MG tablet Take 1 tablet (75 mg total) by mouth daily.   Cyanocobalamin (VITAMIN B-12) 5000 MCG SUBL Take 5,000 mcg by mouth daily.   folic acid (FOLVITE) 784 MCG tablet Take 400 mcg by mouth daily.  furosemide (LASIX) 40 MG tablet Take 1 tablet (40 mg total) by mouth daily as needed for fluid or edema.   isosorbide mononitrate (IMDUR) 30 MG 24 hr tablet Take 2 tablets (60 mg total) by mouth daily.   JARDIANCE 10 MG TABS tablet Take 10 mg by mouth daily.   metoprolol succinate (TOPROL-XL) 25 MG 24 hr tablet Take 3 tablets (75 mg total) by mouth in the morning. & 50 mg in the evening (Patient taking differently: Take 50-75 mg by mouth See admin instructions. Take 3 tablets (75 mg) by  mouth in the morning & take 2 tablets (50 mg) by mouth  in the evening)   Multiple Vitamin (MULTIVITAMIN WITH MINERALS) TABS tablet Take 1 tablet by mouth daily.    nitroGLYCERIN (NITROSTAT) 0.4 MG SL tablet Place 1 tablet (0.4 mg total) under the tongue every 5 (five) minutes x 3 doses as needed for chest pain.   ondansetron (ZOFRAN) 4 MG tablet Take 4 mg by mouth every 6 (six) hours as needed for nausea or vomiting.    pantoprazole (PROTONIX) 40 MG tablet TAKE (1) TABLET BY MOUTH ONCE DAILY.   potassium chloride SA (K-DUR,KLOR-CON) 20 MEQ tablet Take 20 mEq by mouth See admin instructions. Takes 1 tablet (20 meq) by mouth daily (scheduled) & may take a second dose if she has a second dose of Lasix   primidone (MYSOLINE) 50 MG tablet Take 50 mg by mouth at bedtime.   PROAIR HFA 108 (90 Base) MCG/ACT inhaler Inhale 1 puff into the lungs every 4 (four) hours as needed for wheezing or shortness of breath.    promethazine (PHENERGAN) 6.25 MG/5ML syrup Take 6.25 mg by mouth every 6 (six) hours as needed for nausea or vomiting.   ranolazine (RANEXA) 500 MG 12 hr tablet Take 1 tablet (500 mg total) by mouth 2 (two) times daily.   senna-docusate (SENOKOT-S) 8.6-50 MG tablet Take 1 tablet by mouth at bedtime. (Patient taking differently: Take 1 tablet by mouth daily as needed (constipation.).)   VITAMIN D, CHOLECALCIFEROL, PO Take 1 tablet by mouth daily.   No facility-administered encounter medications on file as of 07/01/2021.   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   CATARACT EXTRACTION     CHOLECYSTECTOMY     COLONOSCOPY  2011   COLONOSCOPY N/A 10/19/2014   Procedure: COLONOSCOPY;  Surgeon: Rogene Houston, MD;  [facility];  Service: Endoscopy;  Laterality: N/A;  1030   CORONARY STENT INTERVENTION N/A 05/28/2021   Procedure: CORONARY STENT INTERVENTION;  Surgeon: Leonie Man, MD;  Location:  [place] INVASIVE CV LAB;  Service: Cardiovascular;  Laterality: N/A;   ESOPHAGEAL DILATION N/A 05/02/2020   Procedure: ESOPHAGEAL DILATION;  Surgeon: Rogene Houston, MD;  [facility];  Service: Gastroenterology;  Laterality: N/A;   ESOPHAGOGASTRODUODENOSCOPY N/A 10/26/2014   Procedure: ESOPHAGOGASTRODUODENOSCOPY (EGD);  Surgeon: Rogene Houston, MD;  [facility];  Service: Endoscopy;  Laterality: N/A;  74 - Dr. has lunch and learn   ESOPHAGOGASTRODUODENOSCOPY (EGD) WITH PROPOFOL N/A 05/02/2020   Procedure: ESOPHAGOGASTRODUODENOSCOPY (EGD) WITH PROPOFOL;  Surgeon: Rogene Houston, MD;  [facility];  Service: Gastroenterology;  Laterality: N/A;  250   Esophagogastroduodenoscopy with esophageal dilation  2004, 2006, 2007   GIVENS CAPSULE STUDY N/A 10/31/2014   Procedure: GIVENS CAPSULE STUDY;  Surgeon: Rogene Houston, MD;  [facility];  Service: Endoscopy;  Laterality: N/A;  730 -- pacemaker--needs monitoring--outpatient bed   INSERT / REPLACE / REMOVE PACEMAKER  2010   INTRAVASCULAR ULTRASOUND/IVUS N/A 05/28/2021   Procedure: Intravascular Ultrasound/IVUS;  Surgeon: Leonie Man, MD;  Location: [place] CV LAB;  Service: Cardiovascular;  Laterality: N/A;   LEFT HEART CATH AND CORONARY  ANGIOGRAPHY N/A 03/21/2021   Procedure: LEFT HEART CATH AND CORONARY ANGIOGRAPHY;  Surgeon: Belva Crome, MD;  Location: [place] CV LAB;  Service: Cardiovascular;  Laterality: N/A;   LEFT HEART CATHETERIZATION WITH CORONARY ANGIOGRAM N/A 11/17/2011   Procedure: LEFT HEART CATHETERIZATION WITH CORONARY ANGIOGRAM;  Surgeon: Sherren Mocha, MD;  Location: [facility] CATH LAB;  Service: Cardiovascular;  Laterality: N/A;   LEFT HEART CATHETERIZATION WITH CORONARY ANGIOGRAM N/A 09/26/2014   Procedure: LEFT HEART CATHETERIZATION WITH CORONARY ANGIOGRAM;  Surgeon: Leonie Man, MD;  Location: [facility] CATH LAB;  Service: Cardiovascular;  Laterality: N/A;   Right rotator cuff repair     SHOULDER ACROMIOPLASTY Right 05/30/2015   Procedure: RIGHT SHOULDER ACROMIOPLASTY;  Surgeon: Carole Civil, MD;  Location: [facility];  Service: Orthopedics;  Laterality: Right;   SHOULDER OPEN ROTATOR CUFF REPAIR Right 05/30/2015   Procedure: OPEN ROTATOR CUFF REPAIR RIGHT SHOULDER;  Surgeon: Carole Civil, MD;  Location: [facility];  Service: Orthopedics;  Laterality: Right;   SHOULDER OPEN ROTATOR CUFF REPAIR Left 10/22/2016   Procedure: ROTATOR CUFF REPAIR SHOULDER OPEN;  Surgeon: Carole Civil, MD;  Location: [facility];  Service: Orthopedics;  Laterality: Left;   Subxiphoid pericardial window  11/10   VAGINAL HYSTERECTOMY        Objective: Blood pressure (!) 161/82, pulse 84, temperature 98.2 F (36.8 C), temperature source Oral, height '5\' 5"'  (1.651 m), weight 135 lb 3.2 oz (61.3 kg). Patient has severe hearing impairment which makes communication somewhat difficult. She has tremor involving her head and hands. Conjunctiva is pink. Sclera is nonicteric Oropharyngeal mucosa is normal. She has upper dentures in place.  She is edentulous lower jaw. No neck masses or thyromegaly noted. Cardiac exam with regular rhythm normal S1 and S2. No murmur or gallop noted. Lungs are clear to auscultation. Abdomen is  symmetrical.  Bowel sounds are normal.  No bruit noted.  On palpation abdomen is soft.  She has mild tenderness in right lower quadrant.  No guarding.  No organomegaly or masses.   No LE edema or clubbing noted.  Labs/studies Results:   CBC Latest Ref Rng & Units 05/29/2021 05/28/2021 03/31/2021  WBC 4.0 - 10.5 K/uL 7.7 7.0 6.9  Hemoglobin 12.0 - 15.0 g/dL 12.5 13.5 12.9  Hematocrit 36.0 - 46.0 % 39.6 42.5 40.5  Platelets 150 - 400 K/uL 212 254 218    CMP Latest Ref Rng & Units 05/29/2021 05/28/2021 03/31/2021  Glucose 70 - 99 mg/dL 110(H) 90 100(H)  BUN 8 - 23 mg/dL '11 10 15  ' Creatinine 0.44 - 1.00 mg/dL 0.64 0.74 0.68  Sodium 135 - 145 mmol/L 137 138 135  Potassium 3.5 - 5.1 mmol/L 3.7 4.6 3.9  Chloride 98 - 111 mmol/L 105 103 102  CO2 22 - 32 mmol/L '25 28 24  ' Calcium 8.9 - 10.3 mg/dL 9.1 9.3 9.1  Total Protein 6.5 - 8.1 g/dL - - -  Total Bilirubin 0.3 - 1.2 mg/dL - - -  Alkaline Phos 38 - 126 U/L - - -  AST 15 - 41 U/L - - -  ALT 0 - 44 U/L - - -    Hepatic Function Latest Ref Rng & Units 03/19/2021 06/01/2018 05/25/2017  Total Protein 6.5 - 8.1 g/dL 8.1 8.3(H) 7.9  Albumin 3.5 - 5.0 g/dL 3.9 4.0 4.0  AST 15 - 41 U/L '28 23 25  ' ALT 0 - 44 U/L '20 14 20  ' Alk Phosphatase 38 - 126 U/L 85 80 84  Total Bilirubin 0.3 - 1.2 mg/dL 0.4 0.7 0.3  Bilirubin, Direct <=0.2 mg/dL - - -    Lab Results  Component Value Date   CRP 10.5 (H) 07/01/2009      Assessment:  #1.  Pancreatic Lesion.  Patient was found to have 11 mm hypodense lesion on CTA in August 2022.  MRI not possible because she has pacemaker.  Ultrasound 6 weeks ago margin of this lesion to be 15 x 13 x 11 mm.  This lesion is felt to be an incidental finding.  This lesion does not appear to be impinging upon the main duct.  We are possibly dealing with IPMN/intraductal papillary mucinous neoplasm.  Other possibilities include neuroendocrine tumor or cyst adenoma.  This lesion is small and does not meet the threshold for  aggressive work-up i.e. EUS with FNA.  Even if this lesion was larger than 20 mm we may have to wait given her cardiac history and the fact that she had MI last month leading to multiple angioplasties and placement of 2 drug-eluting stents. Therefore best course of action would be to repeat pancreas protocol CT in February 2023 which would be 6 months from her last CT.  #2.  Lower abdominal pain.  Pain is only experience with defecation.  There is no history of fixed stool caliber change.  We will try her on low-dose antispasmodic and see how she does.  #3.  Chronic right lower quadrant abdominal pain felt to be referred pain from her back.  #4.  GERD.  She is on pantoprazole which is working well.  #5.  Patient is deemed to be high risk for colorectal cancer.  She had small tubular adenoma removed in March 2016.  Her mother had surgery for colon cancer in her early 74s.  Given recent cardiac events I would recommend holding off colonoscopy for another 5 months.  Plan:  Dicyclomine 10 mg by mouth daily 30 minutes before breakfast. Prescription given for 30 doses with 1 refill.  If she experiences side effects she will call office. Metamucil 1 packet or 1 heaping tablespoonful by mouth daily at bedtime. Office visit on 08/01/2021. Pancreas protocol CT February 2023.

## 2021-07-01 NOTE — Patient Instructions (Addendum)
Remember to take Plavix/clopidogrel in the evening since she take pantoprazole in the morning. Metamucil 1 packet or heaping tablespoonful by mouth daily at bedtime. Take Bentyl/dicyclomine 10 mg by mouth every morning 30 minutes before breakfast.  You will undergo pancreas protocol CT in February 2023. Surgery

## 2021-07-02 ENCOUNTER — Other Ambulatory Visit: Payer: Self-pay | Admitting: Cardiology

## 2021-07-02 ENCOUNTER — Other Ambulatory Visit: Payer: Self-pay | Admitting: Internal Medicine

## 2021-07-03 DIAGNOSIS — I10 Essential (primary) hypertension: Secondary | ICD-10-CM | POA: Diagnosis not present

## 2021-07-03 DIAGNOSIS — E782 Mixed hyperlipidemia: Secondary | ICD-10-CM | POA: Diagnosis not present

## 2021-07-04 ENCOUNTER — Encounter (INDEPENDENT_AMBULATORY_CARE_PROVIDER_SITE_OTHER): Payer: Self-pay

## 2021-07-05 ENCOUNTER — Other Ambulatory Visit: Payer: Self-pay | Admitting: *Deleted

## 2021-07-05 NOTE — Patient Outreach (Signed)
[facility]) Care Management  07/05/2021  ZURRI RUDDEN 17-Jun-1936 [id number]   Case Closure -Successful Outreach  Spoke with pt today and verified pt continue to do well with no acute issues however pending GI consult concerning a small pancreatic mass, states pt. No related problems or issues at this time related to this mass. Pt states she has a cold and her provider is aware. Pt aware if symptoms get worse to consult her provider or visit the urgent care if provider is unable to address her acute symptoms.   Informed pt that another case management services would be servicing her through her provider's office.   Pt aware to expect a call of inquiry and [facility] would no longer call concerning case management issues but would remains available until called by providers office or involved agency. Pt verbalized an understanding.  Case Closed.  [name], RN Care Management Coordinator [place] Office [phone]

## 2021-07-09 ENCOUNTER — Ambulatory Visit (INDEPENDENT_AMBULATORY_CARE_PROVIDER_SITE_OTHER): Payer: Medicare Other | Admitting: Internal Medicine

## 2021-07-09 ENCOUNTER — Encounter: Payer: Self-pay | Admitting: Internal Medicine

## 2021-07-09 ENCOUNTER — Other Ambulatory Visit: Payer: Self-pay

## 2021-07-09 VITALS — BP 156/92 | HR 76 | Ht 65.5 in | Wt 133.0 lb

## 2021-07-09 DIAGNOSIS — I495 Sick sinus syndrome: Secondary | ICD-10-CM

## 2021-07-09 DIAGNOSIS — I480 Paroxysmal atrial fibrillation: Secondary | ICD-10-CM | POA: Diagnosis not present

## 2021-07-09 NOTE — Progress Notes (Signed)
HPI Mrs. Hoiland returns today for followup. She is a pleasant 85 yo woman with a h/o HTN, persistent atrial fib, symptomatic tachy-brady syndrome s/p PPM insertion. In the interim, she has done well. She has minimal palpitations. She has not had syncope. She has no edema. She was admitted with an AMI and underwent stenting. She has had Covid.  Allergies  Allergen Reactions   Amitriptyline Hcl Other (See Comments)    Caused "jaws to twist and lock"   Sulfonamide Derivatives Other (See Comments)    UNKNOWN REACTION     Current Outpatient Medications  Medication Sig Dispense Refill   ALPRAZolam (XANAX) 1 MG tablet Take 1 mg by mouth 3 (three) times daily as needed for anxiety.     amiodarone (PACERONE) 200 MG tablet TAKE 1 TABLET ONCE DAILY ON MONDAY THROUGH SATURDAY, NONE ON SUNDAY. 72 tablet 0   aspirin EC 81 MG tablet Take 1 tablet (81 mg total) by mouth daily. Swallow whole. 90 tablet 3   clopidogrel (PLAVIX) 75 MG tablet Take 1 tablet (75 mg total) by mouth daily. 30 tablet 0   Cyanocobalamin (VITAMIN B-12) 5000 MCG SUBL Take 5,000 mcg by mouth daily.     dicyclomine (BENTYL) 10 MG capsule Take 1 capsule (10 mg total) by mouth daily before breakfast. 30 capsule 1   folic acid (FOLVITE) 161 MCG tablet Take 400 mcg by mouth daily.     furosemide (LASIX) 40 MG tablet Take 1 tablet (40 mg total) by mouth daily as needed for fluid or edema. 30 tablet 0   isosorbide mononitrate (IMDUR) 30 MG 24 hr tablet Take 2 tablets (60 mg total) by mouth daily. 180 tablet 0   JARDIANCE 10 MG TABS tablet Take 10 mg by mouth daily.     metoprolol succinate (TOPROL-XL) 25 MG 24 hr tablet Take 2-3 tablets (50-75 mg total) by mouth See admin instructions. Take 3 tablets (75 mg) by mouth in the morning & take 2 tablets (50 mg) by mouth  in the evening 150 tablet 3   Multiple Vitamin (MULTIVITAMIN WITH MINERALS) TABS tablet Take 1 tablet by mouth daily.      nitroGLYCERIN (NITROSTAT) 0.4 MG SL tablet  Place 1 tablet (0.4 mg total) under the tongue every 5 (five) minutes x 3 doses as needed for chest pain. 30 tablet 12   ondansetron (ZOFRAN) 4 MG tablet Take 4 mg by mouth every 6 (six) hours as needed for nausea or vomiting.      pantoprazole (PROTONIX) 40 MG tablet TAKE (1) TABLET BY MOUTH ONCE DAILY. 90 tablet 3   potassium chloride SA (K-DUR,KLOR-CON) 20 MEQ tablet Take 20 mEq by mouth See admin instructions. Takes 1 tablet (20 meq) by mouth daily (scheduled) & may take a second dose if she has a second dose of Lasix     primidone (MYSOLINE) 50 MG tablet Take 50 mg by mouth at bedtime.     PROAIR HFA 108 (90 Base) MCG/ACT inhaler Inhale 1 puff into the lungs every 4 (four) hours as needed for wheezing or shortness of breath.      promethazine (PHENERGAN) 6.25 MG/5ML syrup Take 6.25 mg by mouth every 6 (six) hours as needed for nausea or vomiting.     psyllium (METAMUCIL SMOOTH TEXTURE) 58.6 % powder Take 1 packet by mouth at bedtime.     ranolazine (RANEXA) 500 MG 12 hr tablet Take 1 tablet (500 mg total) by mouth 2 (two) times daily. 60 tablet  11   rosuvastatin (CRESTOR) 20 MG tablet Take 20 mg by mouth at bedtime.     senna-docusate (SENOKOT-S) 8.6-50 MG tablet Take 1 tablet by mouth at bedtime. (Patient taking differently: Take 1 tablet by mouth daily as needed (constipation.).) 30 tablet 0   VITAMIN D, CHOLECALCIFEROL, PO Take 1 tablet by mouth daily.     atorvastatin (LIPITOR) 80 MG tablet TAKE (1) TABLET BY MOUTH ONCE DAILY. (Patient not taking: Reported on 07/09/2021) 90 tablet 2   No current facility-administered medications for this visit.     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   Past Surgical History:  Procedure Laterality Date   APPENDECTOMY     CARDIAC CATHETERIZATION  2010   stents x2.   CATARACT EXTRACTION     CHOLECYSTECTOMY     COLONOSCOPY  2011   COLONOSCOPY N/A 10/19/2014   Procedure: COLONOSCOPY;  Surgeon: Rogene Houston, MD;  [facility];  Service: Endoscopy;  Laterality: N/A;  1030   CORONARY STENT INTERVENTION N/A 05/28/2021   Procedure: CORONARY STENT INTERVENTION;  Surgeon: Leonie Man, MD;  Location: [place] [facility];  Service: Cardiovascular;  Laterality: N/A;   ESOPHAGEAL DILATION N/A 05/02/2020   Procedure: ESOPHAGEAL DILATION;  Surgeon: Rogene Houston, MD;  Location: [facility];  Service: Gastroenterology;  Laterality: N/A;   ESOPHAGOGASTRODUODENOSCOPY N/A 10/26/2014   Procedure: ESOPHAGOGASTRODUODENOSCOPY (EGD);  Surgeon: Rogene Houston, MD;  [facility];  Service: Endoscopy;  Laterality: N/A;  26 - Dr. has lunch and learn   ESOPHAGOGASTRODUODENOSCOPY (EGD) WITH PROPOFOL N/A 05/02/2020   Procedure: ESOPHAGOGASTRODUODENOSCOPY (EGD) WITH PROPOFOL;  Surgeon: Rogene Houston, MD;  [facility];  Service: Gastroenterology;  Laterality: N/A;  250   Esophagogastroduodenoscopy with esophageal dilation  2004, 2006, 2007   GIVENS CAPSULE STUDY N/A 10/31/2014   Procedure: GIVENS CAPSULE STUDY;  Surgeon: Rogene Houston, MD;  [facility]: [facility];  Service: Endoscopy;  Laterality: N/A;  730 -- pacemaker--needs monitoring--outpatient bed   INSERT / REPLACE / REMOVE PACEMAKER  2010    INTRAVASCULAR ULTRASOUND/IVUS N/A 05/28/2021   Procedure: Intravascular Ultrasound/IVUS;  Surgeon: Leonie Man, MD;  Location: [place] [facility];  Service: Cardiovascular;  Laterality: N/A;   LEFT HEART CATH AND CORONARY ANGIOGRAPHY N/A 03/21/2021   Procedure: LEFT HEART CATH AND CORONARY ANGIOGRAPHY;  Surgeon: Belva Crome, MD;  Location: [place] [facility];  Service: Cardiovascular;  Laterality: N/A;   LEFT HEART CATHETERIZATION WITH CORONARY ANGIOGRAM N/A 11/17/2011   Procedure: LEFT HEART CATHETERIZATION WITH CORONARY ANGIOGRAM;  Surgeon: Sherren Mocha, MD;  Location: [facility] CATH  LAB;  Service: Cardiovascular;  Laterality: N/A;   LEFT HEART CATHETERIZATION WITH CORONARY ANGIOGRAM N/A 09/26/2014   Procedure: LEFT HEART CATHETERIZATION WITH CORONARY ANGIOGRAM;  Surgeon: Leonie Man, MD;  Location: [facility] CATH LAB;  Service: Cardiovascular;  Laterality: N/A;   Right rotator cuff repair     SHOULDER ACROMIOPLASTY Right 05/30/2015   Procedure: RIGHT SHOULDER ACROMIOPLASTY;  Surgeon: Carole Civil, MD;  Location: [facility];  Service: Orthopedics;  Laterality: Right;   SHOULDER OPEN ROTATOR CUFF REPAIR Right 05/30/2015   Procedure: OPEN ROTATOR CUFF REPAIR RIGHT SHOULDER;  Surgeon: Carole Civil, MD;  Location: [facility];  Service: Orthopedics;  Laterality: Right;   SHOULDER OPEN ROTATOR CUFF REPAIR Left 10/22/2016   Procedure: ROTATOR CUFF REPAIR SHOULDER OPEN;  Surgeon: Carole Civil, MD;  Location: [facility];  Service: Orthopedics;  Laterality: Left;   Subxiphoid pericardial window  11/10   VAGINAL HYSTERECTOMY       Family History  Problem Relation Age of Onset   Cancer Mother        Colon    Coronary artery disease Sister    Coronary artery disease Brother    Arthritis Other    Lung disease Other    Asthma Other      Social History   Socioeconomic History   Marital status: Widowed    Spouse name: Not on file   Number of children: 1   Years of education: Not on file    Highest education level: 8th grade  Occupational History   Occupation: Retired    Fish farm manager: RETIRED  Tobacco Use   Smoking status: Former    Packs/day: 2.00    Years: 10.00    Pack years: 20.00    Types: Cigarettes    Quit date: 05/23/1989    Years since quitting: 32.1   Smokeless tobacco: Never  Vaping Use   Vaping Use: Never used  Substance and Sexual Activity   Alcohol use: No    Alcohol/week: 0.0 standard drinks   Drug use: No   Sexual activity: Never  Other Topics Concern   Not on file  Social History Narrative   She lives alone, has adopted daughter.   Social Determinants of Health   Financial Resource Strain: Not on file  Food Insecurity: No Food Insecurity   Worried About Charity fundraiser in the Last Year: Never true   Ran Out of Food in the Last Year: Never true  Transportation Needs: No Transportation Needs   Lack of Transportation (Medical): No   Lack of Transportation (Non-Medical): No  Physical Activity: Not on file  Stress: Not on file  Social Connections: Not on file  Intimate Partner Violence: Not on file     BP (!) 156/92   Pulse 76   Ht 5' 5.5" (1.664 m)   Wt 133 lb (60.3 kg)   SpO2 96%   BMI 21.80 kg/m   Physical Exam:  Well appearing NAD HEENT: Unremarkable Neck:  No JVD, no thyromegally Lymphatics:  No adenopathy Back:  No CVA tenderness Lungs:  Clear with no wheezes HEART:  Regular rate rhythm, no murmurs, no rubs, no clicks Abd:  soft, positive bowel sounds, no organomegally, no rebound, no guarding Ext:  2 plus pulses, no edema, no cyanosis, no clubbing Skin:  No rashes no nodules Neuro:  CN II through XII intact, motor grossly intact  EKG - nsr with atrial pacing  DEVICE  Normal device function.  See PaceArt for details.  Assess/Plan:  1. Atrial fib - she is maintaining NSR almost 99% of the time. I have asked her to reduce her amiodarone so that she takes one tab daily, none on Sunday. 2. PPM -her medtronic DDD PM is  working normally. We will recheck in several months. 3. Coags - she refuses an [place] or warfarin. I will switch her to ASA as per her request.  4. HTN - her bp is fairly well controlled. She is encouraged to avoid salty foods.   Carleene Overlie Alizabeth Antonio,MD

## 2021-07-09 NOTE — Patient Instructions (Signed)
Medication Instructions:  Your physician recommends that you continue on your current medications as directed. Please refer to the Current Medication list given to you today.  *If you need a refill on your cardiac medications before your next appointment, please call your pharmacy*   Lab Work: None If you have labs (blood work) drawn today and your tests are completely normal, you will receive your results only by: [place] (if you have MyChart) OR A paper copy in the mail If you have any lab test that is abnormal or we need to change your treatment, we will call you to review the results.   Testing/Procedures: None   Follow-Up: At [facility], you and your health needs are our priority.  As part of our continuing mission to provide you with exceptional heart care, we have created designated Provider Care Teams.  These Care Teams include your primary Cardiologist (physician) and Advanced Practice Providers (APPs -  Physician Assistants and Nurse Practitioners) who all work together to provide you with the care you need, when you need it.  We recommend signing up for the patient portal called "MyChart".  Sign up information is provided on this After Visit Summary.  MyChart is used to connect with patients for Virtual Visits (Telemedicine).  Patients are able to view lab/test results, encounter notes, upcoming appointments, etc.  Non-urgent messages can be sent to your provider as well.   To learn more about what you can do with MyChart, go to [email or web address].    Your next appointment:   1 year(s)  The format for your next appointment:   In Person  Provider:   [name], MD    Other Instructions

## 2021-07-29 DIAGNOSIS — R0602 Shortness of breath: Secondary | ICD-10-CM | POA: Diagnosis not present

## 2021-07-29 DIAGNOSIS — I2510 Atherosclerotic heart disease of native coronary artery without angina pectoris: Secondary | ICD-10-CM | POA: Diagnosis not present

## 2021-08-01 ENCOUNTER — Other Ambulatory Visit: Payer: Self-pay

## 2021-08-01 ENCOUNTER — Ambulatory Visit (INDEPENDENT_AMBULATORY_CARE_PROVIDER_SITE_OTHER): Payer: Medicare Other | Admitting: Internal Medicine

## 2021-08-01 ENCOUNTER — Telehealth: Payer: Self-pay | Admitting: Cardiology

## 2021-08-01 ENCOUNTER — Encounter (INDEPENDENT_AMBULATORY_CARE_PROVIDER_SITE_OTHER): Payer: Self-pay | Admitting: Internal Medicine

## 2021-08-01 VITALS — BP 175/87 | HR 70 | Temp 97.6°F | Ht 65.5 in | Wt 133.4 lb

## 2021-08-01 DIAGNOSIS — K219 Gastro-esophageal reflux disease without esophagitis: Secondary | ICD-10-CM

## 2021-08-01 DIAGNOSIS — R1030 Lower abdominal pain, unspecified: Secondary | ICD-10-CM | POA: Diagnosis not present

## 2021-08-01 MED ORDER — TICAGRELOR 90 MG PO TABS
90.0000 mg | ORAL_TABLET | Freq: Two times a day (BID) | ORAL | 11 refills | Status: DC
Start: 2021-08-01 — End: 2022-02-24

## 2021-08-01 MED ORDER — DICYCLOMINE HCL 10 MG PO CAPS: 10.0000 mg | ORAL_CAPSULE | Freq: Two times a day (BID) | ORAL | 2 refills | Status: DC | PRN

## 2021-08-01 NOTE — Telephone Encounter (Signed)
Pt notified to start Brilinta 90 mg two times daily

## 2021-08-01 NOTE — Progress Notes (Signed)
Presenting complaint;  Follow for abdominal pain.  Database and subjective:  Patient is 85 year old Caucasian female who has chronic GERD possible esophageal motility disorder as well as IBS and chronic right lower quadrant abdominal pain relieved with defecation and history of discovered on CT and endoscopic ultrasound.  She also has history of tubular adenoma and family history of colon carcinoma. Patient was last seen 4 weeks ago for worsening right lower quadrant abdominal pain she reported pain relief with defecation.  Her pain was felt to be due to IBS.  She was begun on dicyclomine 10 mg before breakfast every morning. Patient is accompanied by her neighbor Otila Kluver.  Patient reports improvement in pain severity with dicyclomine.  Some days she does not experience pain.  She just has run out of medication has not refilled yet.  She is also taking Peri-Colace on as-needed basis.  She denies melena or rectal bleeding.  She says heartburn is well controlled with therapy and she has been able to swallow since last esophageal dilation of September 2021. Patient states she stopped clopidogrel 3 days ago because she developed rash and itching.  She recalls she had similar problem few months ago and Dr. Lovena Le had discontinued this medication.  Since she suffered MI and had coronary stenting last month she was placed back on clopidogrel.  She has started to take baby aspirin.  She has not called Dr. Myles Gip office. She states her appetite is fair.  She tries to drink Ensure and Sustacal every day.  She has not lost any weight since her last visit.  Current Medications: Outpatient Encounter Medications as of 08/01/2021  Medication Sig   ALPRAZolam (XANAX) 1 MG tablet Take 1 mg by mouth 3 (three) times daily as needed for anxiety.   amiodarone (PACERONE) 200 MG tablet TAKE 1 TABLET ONCE DAILY ON MONDAY THROUGH SATURDAY, NONE ON SUNDAY.   aspirin EC 81 MG tablet Take 1 tablet (81 mg total) by mouth  daily. Swallow whole.   clopidogrel (PLAVIX) 75 MG tablet Take 1 tablet (75 mg total) by mouth daily.   Cyanocobalamin (VITAMIN B-12) 5000 MCG SUBL Take 5,000 mcg by mouth daily.   folic acid (FOLVITE) 761 MCG tablet Take 400 mcg by mouth daily.   furosemide (LASIX) 40 MG tablet Take 1 tablet (40 mg total) by mouth daily as needed for fluid or edema.   isosorbide mononitrate (IMDUR) 30 MG 24 hr tablet Take 2 tablets (60 mg total) by mouth daily.   JARDIANCE 10 MG TABS tablet Take 10 mg by mouth daily.   metoprolol succinate (TOPROL-XL) 25 MG 24 hr tablet Take 2-3 tablets (50-75 mg total) by mouth See admin instructions. Take 3 tablets (75 mg) by mouth in the morning & take 2 tablets (50 mg) by mouth  in the evening   Multiple Vitamin (MULTIVITAMIN WITH MINERALS) TABS tablet Take 1 tablet by mouth daily.    nitroGLYCERIN (NITROSTAT) 0.4 MG SL tablet Place 1 tablet (0.4 mg total) under the tongue every 5 (five) minutes x 3 doses as needed for chest pain.   pantoprazole (PROTONIX) 40 MG tablet TAKE (1) TABLET BY MOUTH ONCE DAILY.   potassium chloride SA (K-DUR,KLOR-CON) 20 MEQ tablet Take 20 mEq by mouth See admin instructions. Takes 1 tablet (20 meq) by mouth daily (scheduled) & may take a second dose if she has a second dose of Lasix   primidone (MYSOLINE) 50 MG tablet Take 50 mg by mouth at bedtime.   PROAIR HFA 108 (90  Base) MCG/ACT inhaler Inhale 1 puff into the lungs every 4 (four) hours as needed for wheezing or shortness of breath.    ranolazine (RANEXA) 500 MG 12 hr tablet Take 1 tablet (500 mg total) by mouth 2 (two) times daily.   rosuvastatin (CRESTOR) 20 MG tablet Take 20 mg by mouth at bedtime.   senna-docusate (SENOKOT-S) 8.6-50 MG tablet Take 1 tablet by mouth at bedtime. (Patient taking differently: Take 1 tablet by mouth daily as needed (constipation.).)   VITAMIN D, CHOLECALCIFEROL, PO Take 1 tablet by mouth daily.   dicyclomine (BENTYL) 10 MG capsule Take 1 capsule (10 mg total) by  mouth daily before breakfast. (Patient not taking: Reported on 08/01/2021)   ondansetron (ZOFRAN) 4 MG tablet Take 4 mg by mouth every 6 (six) hours as needed for nausea or vomiting.  (Patient not taking: Reported on 08/01/2021)   [DISCONTINUED] atorvastatin (LIPITOR) 80 MG tablet TAKE (1) TABLET BY MOUTH ONCE DAILY. (Patient not taking: Reported on 07/09/2021)   [DISCONTINUED] promethazine (PHENERGAN) 6.25 MG/5ML syrup Take 6.25 mg by mouth every 6 (six) hours as needed for nausea or vomiting.   [DISCONTINUED] psyllium (METAMUCIL SMOOTH TEXTURE) 58.6 % powder Take 1 packet by mouth at bedtime.   No facility-administered encounter medications on file as of 08/01/2021.     Objective: Blood pressure (!) 175/87, pulse 70, temperature 97.6 F (36.4 C), temperature source Oral, height 5' 5.5" (1.664 m), weight 133 lb 6.4 oz (60.5 kg). Patient is alert and in no acute distress. She has tremors to her hands. She also has hearing impairment. Conjunctiva is pink. Sclera is nonicteric Oropharyngeal mucosa is normal. No neck masses or thyromegaly noted. Cardiac exam with regular rhythm normal S1 and S2. No murmur or gallop noted. Lungs are clear to auscultation. Abdomen is symmetrical.  She has low midline scar.  On palpation abdomen is soft.  She has mild tenderness in right lower quadrant.  No organomegaly or masses. No LE edema or clubbing noted.   Assessment:  #1.  Right lower quadrant abdominal pain.  Pain is multifactorial.  Pain due to IBS has responded to low-dose dicyclomine.  States his medication is helping she can take additional dose on days when she has pain later in the day.  As she is prone to constipation with dicyclomine she should consider taking Peri-Colace on schedule which can be 1 tablet daily or every other day.  #2.  Chronic GERD.  She is doing well with therapy.  #3.  Pruritus and rash noted secondary to clopidogrel which she has discontinued.  Given that she had MI last  month leading to coronary stenting this needs to be brought to Dr. Myles Gip attention.   Plan:  Continue dicyclomine 10 mg daily before breakfast and use second dose on as-needed basis.  If second dose is necessary it should be taken no earlier than 6 hours after her first dose and no later than 5 PM. Patient advised to take Peri-Colace 1 tablet daily or every other day in order to prevent constipation. Patient advised to stop by Dr. Myles Gip office to let them know that she has stopped clopidogrel due to rash and pruritus. Office visit in 4 months.

## 2021-08-01 NOTE — Telephone Encounter (Signed)
Patient was started on  Plavix while in the hospital recently.   She has stop taking the  med due  to itching and rash.    Saw Dr. Shelva Majestic today, who suggested she stop by our office to let us know and to see what the next step will be.

## 2021-08-01 NOTE — Patient Instructions (Addendum)
Continue dicyclomine 10 mg daily before breakfast and use second dose on as-needed basis but no earlier than 6 hours after first dose. Take senna and stool softener on schedule rather than on as needed basis.  He can take 1 tablet every night for 1 every other night. Please stop by Dr. Myles Gip office and let them know that you are not taking clopidogrel because of rash.

## 2021-08-02 DIAGNOSIS — E782 Mixed hyperlipidemia: Secondary | ICD-10-CM | POA: Diagnosis not present

## 2021-08-02 DIAGNOSIS — I10 Essential (primary) hypertension: Secondary | ICD-10-CM | POA: Diagnosis not present

## 2021-08-08 DIAGNOSIS — I10 Essential (primary) hypertension: Secondary | ICD-10-CM | POA: Diagnosis not present

## 2021-08-15 ENCOUNTER — Ambulatory Visit (INDEPENDENT_AMBULATORY_CARE_PROVIDER_SITE_OTHER): Payer: Commercial Managed Care - HMO

## 2021-08-15 DIAGNOSIS — I495 Sick sinus syndrome: Secondary | ICD-10-CM | POA: Diagnosis not present

## 2021-08-15 LAB — CUP PACEART REMOTE DEVICE CHECK
Battery Impedance: 2897 Ohm
Battery Remaining Longevity: 21 mo
Battery Voltage: 2.71 V
Brady Statistic AP VP Percent: 42 %
Brady Statistic AP VS Percent: 55 %
Brady Statistic AS VP Percent: 0 %
Brady Statistic AS VS Percent: 3 %
Date Time Interrogation Session: 20230112135351
Implantable Lead Implant Date: 20100526
Implantable Lead Implant Date: 20100526
Implantable Lead Location: 753859
Implantable Lead Location: 753860
Implantable Lead Model: 5076
Implantable Lead Model: 5076
Implantable Pulse Generator Implant Date: 20100526
Lead Channel Impedance Value: 450 Ohm
Lead Channel Impedance Value: 473 Ohm
Lead Channel Pacing Threshold Amplitude: 0.625 V
Lead Channel Pacing Threshold Amplitude: 1 V
Lead Channel Pacing Threshold Pulse Width: 0.4 ms
Lead Channel Pacing Threshold Pulse Width: 0.4 ms
Lead Channel Setting Pacing Amplitude: 2 V
Lead Channel Setting Pacing Amplitude: 2.5 V
Lead Channel Setting Pacing Pulse Width: 0.4 ms
Lead Channel Setting Sensing Sensitivity: 2.8 mV

## 2021-08-27 NOTE — Progress Notes (Signed)
Remote pacemaker transmission.   

## 2021-08-29 DIAGNOSIS — R0602 Shortness of breath: Secondary | ICD-10-CM | POA: Diagnosis not present

## 2021-08-29 DIAGNOSIS — I2510 Atherosclerotic heart disease of native coronary artery without angina pectoris: Secondary | ICD-10-CM | POA: Diagnosis not present

## 2021-08-30 ENCOUNTER — Ambulatory Visit (INDEPENDENT_AMBULATORY_CARE_PROVIDER_SITE_OTHER): Payer: Medicare Other | Admitting: Cardiology

## 2021-08-30 ENCOUNTER — Encounter: Payer: Self-pay | Admitting: Cardiology

## 2021-08-30 VITALS — BP 152/74 | HR 56 | Ht 65.0 in | Wt 132.0 lb

## 2021-08-30 DIAGNOSIS — I25119 Atherosclerotic heart disease of native coronary artery with unspecified angina pectoris: Secondary | ICD-10-CM

## 2021-08-30 DIAGNOSIS — I495 Sick sinus syndrome: Secondary | ICD-10-CM

## 2021-08-30 DIAGNOSIS — I480 Paroxysmal atrial fibrillation: Secondary | ICD-10-CM

## 2021-08-30 NOTE — Progress Notes (Signed)
Cardiology Office Note  Date: 08/30/2021   ID: SERINE KEA, DOB April 28, 1936, MRN [id number]  PCP:  Celene Squibb, MD  Cardiologist:  Rozann Lesches, MD Electrophysiologist:  [name], MD   Chief Complaint  Patient presents with   Cardiac follow-up    History of Present Illness: LEVON PENNING is a medically complex 86 y.o. female last seen in November 2022 by Mr. Leonides Sake NP.  She is here today for a follow-up visit.  She does report improvement in angina symptoms to some degree after PCI in October 2022 as discussed below.  Still chronically short of breath and NYHA class III.  No sense of palpitations.  No reported spontaneous bleeding problems or syncope.  She has a Medtronic pacemaker in place with follow-up by Dr. Lovena Le.  Office note reviewed from December 2022.  Most recent device interrogation revealed normal function.  AF burden is very low in general.  She underwent complex PCI of the RCA in October 2022 using Shockwave Lithotripsy and ScoreFlex angioplasty with DES x2 to the mid to distal RCA and angioplasty alone of the ostial/proximal RCA in-stent restenosis.  She had previously been felt to be a poor operative candidate on evaluation by Dr. Roxan Hockey.  I reviewed her lab work per Dr. Nevada Crane from November 2022 as noted below.  She was switched from Lipitor to Crestor which she is tolerating.  CHA2DS2-VASc score is 5, she has declined anticoagulation.  From the perspective of her ischemic heart disease and recent DES intervention, she is on aspirin and Brilinta, was taken off Plavix due to rash.  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   CATARACT EXTRACTION     CHOLECYSTECTOMY     COLONOSCOPY  2011   COLONOSCOPY N/A 10/19/2014   Procedure: COLONOSCOPY;  Surgeon: Rogene Houston, MD;  [facility];  Service: Endoscopy;  Laterality: N/A;  1030   CORONARY STENT INTERVENTION N/A 05/28/2021   Procedure: CORONARY STENT INTERVENTION;  Surgeon: Leonie Man, MD;  Location: [facility];  Service: Cardiovascular;  Laterality: N/A;   ESOPHAGEAL DILATION N/A 05/02/2020   Procedure: ESOPHAGEAL DILATION;  Surgeon: Rogene Houston, MD;  [facility];  Service: Gastroenterology;  Laterality: N/A;   ESOPHAGOGASTRODUODENOSCOPY N/A 10/26/2014   Procedure: ESOPHAGOGASTRODUODENOSCOPY (EGD);  Surgeon: Rogene Houston, MD;  [facility];  Service: Endoscopy;  Laterality: N/A;  64 - Dr. has lunch and learn   ESOPHAGOGASTRODUODENOSCOPY (EGD) WITH PROPOFOL N/A 05/02/2020   Procedure: ESOPHAGOGASTRODUODENOSCOPY (EGD) WITH PROPOFOL;  Surgeon: Rogene Houston, MD;  [facility];  Service: Gastroenterology;  Laterality: N/A;  250   Esophagogastroduodenoscopy with esophageal dilation  2004, 2006, 2007   GIVENS CAPSULE STUDY N/A 10/31/2014   Procedure: GIVENS CAPSULE STUDY;  Surgeon: Rogene Houston, MD;  [facility];  Service: Endoscopy;  Laterality: N/A;  730 -- pacemaker--needs monitoring--outpatient bed   INSERT / REPLACE / REMOVE PACEMAKER  2010   INTRAVASCULAR ULTRASOUND/IVUS N/A 05/28/2021   Procedure: Intravascular Ultrasound/IVUS;  Surgeon: Leonie Man, MD;   Location: [facility];  Service: Cardiovascular;  Laterality: N/A;   LEFT HEART CATH AND CORONARY ANGIOGRAPHY N/A 03/21/2021   Procedure: LEFT HEART CATH AND CORONARY ANGIOGRAPHY;  Surgeon: Belva Crome, MD;  Location: [facility];  Service: Cardiovascular;  Laterality: N/A;   LEFT HEART CATHETERIZATION WITH CORONARY ANGIOGRAM N/A 11/17/2011   Procedure: LEFT HEART CATHETERIZATION WITH CORONARY ANGIOGRAM;  Surgeon: Sherren Mocha, MD;  Location: [facility];  Service: Cardiovascular;  Laterality: N/A;   LEFT HEART CATHETERIZATION WITH CORONARY ANGIOGRAM N/A 09/26/2014   Procedure: LEFT HEART CATHETERIZATION WITH CORONARY ANGIOGRAM;  Surgeon: Leonie Man, MD;  Location: [facility] C[facility] LAB;  Service: Cardiovascular;  Laterality: N/A;   Right rotator cuff repair     SHOULDER ACROMIOPLASTY Right 05/30/2015   Procedure: RIGHT SHOULDER ACROMIOPLASTY;  Surgeon: Carole Civil, MD;  Location: [facility];  Service: Orthopedics;  Laterality: Right;   SHOULDER OPEN ROTATOR CUFF REPAIR Right 05/30/2015   Procedure: OPEN ROTATOR CUFF REPAIR RIGHT SHOULDER;  Surgeon: Carole Civil, MD;  Location: [facility];  Service: Orthopedics;  Laterality: Right;   SHOULDER OPEN ROTATOR CUFF REPAIR Left 10/22/2016   Procedure: ROTATOR CUFF REPAIR SHOULDER OPEN;  Surgeon: Carole Civil, MD;  Location: [facility];  Service: Orthopedics;  Laterality: Left;   Subxiphoid pericardial window  11/10   VAGINAL HYSTERECTOMY      Current Outpatient Medications  Medication Sig Dispense Refill   ALPRAZolam (XANAX) 1 MG tablet Take 1 mg by mouth 3 (three) times daily as needed for anxiety.     amiodarone (PACERONE) 200 MG tablet TAKE 1 TABLET ONCE DAILY ON MONDAY THROUGH SATURDAY, NONE ON SUNDAY. 72 tablet 0   aspirin EC 81 MG tablet Take 1 tablet (81 mg total) by mouth daily. Swallow whole. 90 tablet 3   Cyanocobalamin (VITAMIN B-12) 5000 MCG SUBL Take 5,000 mcg by mouth daily.     dicyclomine (BENTYL) 10 MG capsule Take  1 capsule (10 mg total) by mouth 2 (two) times daily as needed. 60 capsule 2   folic acid (FOLVITE) 423 MCG tablet Take 400 mcg by mouth daily.     furosemide (LASIX) 40 MG tablet Take 1 tablet (40 mg total) by mouth daily as needed for fluid or edema. 30 tablet 0   isosorbide mononitrate (IMDUR) 30 MG 24 hr tablet Take 2 tablets (60 mg total) by mouth daily. 180 tablet 0   JARDIANCE 10 MG TABS tablet Take 10 mg by mouth daily.     metoprolol succinate (TOPROL-XL) 25 MG 24 hr tablet Take 2-3 tablets (50-75 mg total) by mouth See admin instructions. Take 3 tablets (75 mg) by mouth in the morning & take 2 tablets (50 mg) by mouth  in the evening 150 tablet 3   Multiple Vitamin (MULTIVITAMIN WITH MINERALS) TABS tablet Take 1 tablet by mouth daily.      nitroGLYCERIN (NITROSTAT) 0.4 MG SL tablet Place 1 tablet (0.4 mg total) under the tongue every 5 (  five) minutes x 3 doses as needed for chest pain. 30 tablet 12   pantoprazole (PROTONIX) 40 MG tablet TAKE (1) TABLET BY MOUTH ONCE DAILY. 90 tablet 3   potassium chloride SA (K-DUR,KLOR-CON) 20 MEQ tablet Take 20 mEq by mouth See admin instructions. Takes 1 tablet (20 meq) by mouth daily (scheduled) & may take a second dose if she has a second dose of Lasix     primidone (MYSOLINE) 50 MG tablet Take 50 mg by mouth at bedtime.     PROAIR HFA 108 (90 Base) MCG/ACT inhaler Inhale 1 puff into the lungs every 4 (four) hours as needed for wheezing or shortness of breath.      ranolazine (RANEXA) 500 MG 12 hr tablet Take 1 tablet (500 mg total) by mouth 2 (two) times daily. 60 tablet 11   rosuvastatin (CRESTOR) 20 MG tablet Take 20 mg by mouth at bedtime.     senna-docusate (SENOKOT-S) 8.6-50 MG tablet Take 1 tablet by mouth at bedtime. (Patient taking differently: Take 1 tablet by mouth daily as needed (constipation.).) 30 tablet 0   ticagrelor (BRILINTA) 90 MG TABS tablet Take 1 tablet (90 mg total) by mouth 2 (two) times daily. 60 tablet 11   VITAMIN D,  CHOLECALCIFEROL, PO Take 1 tablet by mouth daily.     ondansetron (ZOFRAN) 4 MG tablet Take 4 mg by mouth every 6 (six) hours as needed for nausea or vomiting.  (Patient not taking: Reported on 08/01/2021)     No current facility-administered medications for this visit.   Allergies:  Amitriptyline hcl, Plavix [clopidogrel bisulfate], and Sulfonamide derivatives   ROS: Chronic hearing loss.  Physical Exam: VS:  BP (!) 152/74    Pulse (!) 56    Ht 5\' 5"  (1.651 m)    Wt 132 lb (59.9 kg)    SpO2 98%    BMI 21.97 kg/m , BMI Body mass index is 21.97 kg/m.  Wt Readings from Last 3 Encounters:  08/30/21 132 lb (59.9 kg)  08/01/21 133 lb 6.4 oz (60.5 kg)  07/09/21 133 lb (60.3 kg)    General: Elderly woman, appears comfortable at rest. HEENT: Conjunctiva and lids normal, wearing a mask. Neck: Supple, no elevated JVP or carotid bruits, no thyromegaly. Lungs: Clear to auscultation, nonlabored breathing at rest. Cardiac: Regular rate and rhythm, no S3, 1/6 systolic murmur, no pericardial rub. Extremities: No pitting edema.  ECG:  An ECG dated 07/09/2021 was personally reviewed today and demonstrated:  Atrial paced rhythm with nonspecific T wave changes.  Recent Labwork: 03/19/2021: ALT 20; AST 28 03/20/2021: Magnesium 2.2 03/30/2021: B Natriuretic Peptide 253.0 05/29/2021: BUN 11; Creatinine, Ser 0.64; Hemoglobin 12.5; Platelets 212; Potassium 3.7; Sodium 137     Component Value Date/Time   CHOL 200 03/20/2021 1605   TRIG 130 03/20/2021 1605   HDL 49 03/20/2021 1605   CHOLHDL 4.1 03/20/2021 1605   VLDL 26 03/20/2021 1605   LDLCALC 125 (H) 03/20/2021 1605  November 2022: Hemoglobin A1c 5.6%, cholesterol 235, triglycerides 337, HDL 44, LDL 131, BUN 9, creatinine 0.8, potassium 5.2, AST 23, ALT 14, hemoglobin 13.3, platelets 235  Other Studies Reviewed Today:  Echocardiogram 03/20/2021:  1. Left ventricular ejection fraction, by estimation, is 55 to 60%. The  left ventricle has normal  function. The left ventricle has no regional  wall motion abnormalities. Left ventricular diastolic parameters are  consistent with Grade II diastolic  dysfunction (pseudonormalization). Elevated left ventricular end-diastolic  pressure.   2. Right ventricular systolic  function is normal. The right ventricular  size is normal. There is normal pulmonary artery systolic pressure.   3. Left atrial size was moderately dilated.   4. The mitral valve is degenerative. Mild mitral valve regurgitation. No  evidence of mitral stenosis.   5. There is partial fusion of the left and non-coronary cusps. The aortic  valve is calcified. There is mild calcification of the aortic valve. There  is mild thickening of the aortic valve. Aortic valve regurgitation is  mild. Mild aortic valve sclerosis  is present, with no evidence of aortic valve stenosis. Aortic  regurgitation PHT measures 582 msec. Aortic valve area, by VTI measures  2.49 cm. Aortic valve mean gradient measures 3.5 mmHg. Aortic valve Vmax  measures 1.23 m/s.   6. Aortic dilatation noted. There is mild dilatation of the aortic root,  measuring 37 mm. There is mild dilatation of the ascending aorta,  measuring 38 mm.   7. The inferior vena cava is normal in size with greater than 50%  respiratory variability, suggesting right atrial pressure of 3 mmHg.   Cardiac catheterization and PCI 05/28/2021:   ---------- IVUS-guided PTCA/PCI: Combination SHOCKWAVE LITHOTRIPSY, & SCOREFLEX ANGIOPLASTY with GUIDELINER support-----------   LESION #1 prox RCA lesion is 85% stenosed distal to previous stent.   Scoring balloon angioplasty was performed following SHOCKWAVE LITHOTRIPSY (3.0 x 12) BALLN (2 runs of 10 pulses) using a BALLN SCOREFLEX 3.0X15.   Lesion #2 mid RCA to Dist RCA lesion is 65% stenosed.   Scoring balloon angioplasty was performed using a BALLN SCOREFLEX 3.0X15.  With Use of GuideLiner Catheter Extension   A drug-eluting stent was  successfully placed covering both LESIONS #1 and #2 (overlapping previous stent) using a SYNERGY XD 3.0X48.  Postdilated to 3.6 mm   Post intervention, there is a 0% residual stenosis in both these LESIONS #1 and #2.Marland Kitchen   LESION #3 Ost RCA to Prox RCA previously placed stents are 70% stenosed.   Scoring balloon angioplasty was performed using a BALLN SCOREFLEX 3.0X15 -> followed by post dilation with 3.5 mm x 15 mm [place]y balloon. ->  Achieved 3.5 mm.   Post intervention, there is a 10% residual in-stent restenosis.   ---------------------------------   Lesion #4 Ost RCA lesion is 75% stenosed.   A drug-eluting stent was successfully placed at the ostium and overlapping previous stent proximally, using a SYNERGY XD 3.50X12.  Postdilated to 3.65 mm.   Post intervention, there is a 0% residual stenosis.   Post intervention, there is a 0% residual stenosis with exception of the 10% residual ISR in the previously placed stents which are likely undersized 2.75 mm stents.   SUMMARY Successful Complext DES PCI of Ostial & mid to Distal RCA using Shockwave Lithotrypsy and ScoreFlex Scoring Balloon Angioplasty with Guideliner Extension Catheter. Mid-Distal RCA tandem lesions: Finally treated with distally overlapping SYNERGY XD (DES) 3.0X48 mm postdilated to 3.6 mm High-pressure score flex angioplasty followed by post dilation of previously placed ostial and proximal RCA stent dilating up to 3.5 mm-residual 10% Ostial RCA PCI with SYNERGY XD 3.5X12. ->  Deployed at 3.65 mm. --> All lesions segments reduced to 0% with exception of the in-stent restenosis portion which was 10%. --> TIMI-3 flow pre and post.  Assessment and Plan:  1.  Multivessel CAD, most recently status post complex PCI of the RCA including shockwave lithotripsy and score flex angioplasty, DES x2 to the mid to distal RCA, and angioplasty alone to the ostial/proximal RCA in-stent restenosis.  She is on aspirin and Brilinta, tolerating both.   Residual disease includes 75% calcified ostial LAD with 80% mid and distal stenoses, diffusely diseased circumflex.  She was felt to be a poor candidate for CABG on evaluation by Dr. Roxan Hockey prior to her most recent PCI.  Plan to continue medical therapy for now which includes Toprol-XL, Crestor, Imdur, Jardiance, and Ranexa.  2.  Paroxysmal atrial fibrillation with CHA2DS2-VASc score of 5.  She has declined anticoagulation.  Fortunately, has low arrhythmia burden on amiodarone based on advice interrogation.  3.  Sinus node dysfunction, Medtronic pacemaker in place with routine follow-up by Dr. Lovena Le.  Recent device interrogation shows normal function.  4.  Mixed hyperlipidemia, LDL 131 in November 2022.  She has been switched from Lipitor to Crestor.  Continue to follow-up repeat lab work with Dr. Nevada Crane.  Medication Adjustments/Labs and Tests Ordered: Current medicines are reviewed at length with the patient today.  Concerns regarding medicines are outlined above.   Tests Ordered: No orders of the defined types were placed in this encounter.   Medication Changes: No orders of the defined types were placed in this encounter.   Disposition:  Follow up  3 months  Signed, Satira Sark, MD, [facility] 08/30/2021 2:27 PM    [facility] at [facility] [address] [place], [place] [address] Phone: [phone]; Fax: [phone]

## 2021-08-30 NOTE — Patient Instructions (Signed)
Medication Instructions:  ?Your physician recommends that you continue on your current medications as directed. Please refer to the Current Medication list given to you today. ? ? ?Labwork: ?None today ? ?Testing/Procedures: ?None today ? ?Follow-Up: ?3 months ? ?Any Other Special Instructions Will Be Listed Below (If Applicable). ? ?If you need a refill on your cardiac medications before your next appointment, please call your pharmacy. ? ?

## 2021-09-02 ENCOUNTER — Encounter (INDEPENDENT_AMBULATORY_CARE_PROVIDER_SITE_OTHER): Payer: Self-pay | Admitting: *Deleted

## 2021-09-03 DIAGNOSIS — I10 Essential (primary) hypertension: Secondary | ICD-10-CM | POA: Diagnosis not present

## 2021-09-03 DIAGNOSIS — E785 Hyperlipidemia, unspecified: Secondary | ICD-10-CM | POA: Diagnosis not present

## 2021-09-04 ENCOUNTER — Telehealth: Payer: Self-pay | Admitting: Internal Medicine

## 2021-09-04 NOTE — Telephone Encounter (Signed)
° °  Pt c/o Shortness Of Breath: STAT if SOB developed within the last 24 hours or pt is noticeably SOB on the phone  1. Are you currently SOB (can you hear that pt is SOB on the phone)? Yes   2. How long have you been experiencing SOB? Since she saw Dr. Domenic Polite   3. Are you SOB when sitting or when up moving around?   4. Are you currently experiencing any other symptoms?   Pt said she's been SOB since she saw Dr. Domenic Polite last week, today, her pacemaker monitor keeps lighting up and going off, she wanted to know if there's something wrong with her pacemaker

## 2021-09-04 NOTE — Telephone Encounter (Signed)
Pt notified that it's common for the type of model pacemaker she has, to have the lights turning on and off.   Pt stated that she is sob, but also remarked that she is always sob. Pt reported that she cannot catch her breath when she's sitting, standing, or laying. Will fwd to provider for recommendations.

## 2021-09-04 NOTE — Telephone Encounter (Signed)
Patient notified and verbalized understanding. Pt verbalized going to ER if she gets worse.

## 2021-09-04 NOTE — Telephone Encounter (Signed)
Satira Sark, MD     She has chronic shortness of breath, recently evaluated in the office and relatively stable on current medical regimen and with interval PCI.  If her symptoms are worsening we can certainly arrange a follow-up visit for further discussion.  If she becomes acutely worse, would recommend ER evaluation

## 2021-09-09 ENCOUNTER — Other Ambulatory Visit (INDEPENDENT_AMBULATORY_CARE_PROVIDER_SITE_OTHER): Payer: Self-pay

## 2021-09-09 DIAGNOSIS — K869 Disease of pancreas, unspecified: Secondary | ICD-10-CM

## 2021-09-25 ENCOUNTER — Encounter: Payer: Self-pay | Admitting: Cardiology

## 2021-09-25 DIAGNOSIS — R7301 Impaired fasting glucose: Secondary | ICD-10-CM | POA: Diagnosis not present

## 2021-09-25 DIAGNOSIS — E782 Mixed hyperlipidemia: Secondary | ICD-10-CM | POA: Diagnosis not present

## 2021-09-26 ENCOUNTER — Other Ambulatory Visit: Payer: Self-pay | Admitting: Internal Medicine

## 2021-09-26 ENCOUNTER — Other Ambulatory Visit (INDEPENDENT_AMBULATORY_CARE_PROVIDER_SITE_OTHER): Payer: Self-pay | Admitting: Internal Medicine

## 2021-09-26 ENCOUNTER — Other Ambulatory Visit: Payer: Self-pay | Admitting: Cardiology

## 2021-09-29 DIAGNOSIS — R0602 Shortness of breath: Secondary | ICD-10-CM | POA: Diagnosis not present

## 2021-09-29 DIAGNOSIS — I2510 Atherosclerotic heart disease of native coronary artery without angina pectoris: Secondary | ICD-10-CM | POA: Diagnosis not present

## 2021-10-01 ENCOUNTER — Telehealth: Payer: Self-pay | Admitting: Cardiology

## 2021-10-01 DIAGNOSIS — I10 Essential (primary) hypertension: Secondary | ICD-10-CM | POA: Diagnosis not present

## 2021-10-01 DIAGNOSIS — K219 Gastro-esophageal reflux disease without esophagitis: Secondary | ICD-10-CM | POA: Diagnosis not present

## 2021-10-01 DIAGNOSIS — I480 Paroxysmal atrial fibrillation: Secondary | ICD-10-CM | POA: Diagnosis not present

## 2021-10-01 DIAGNOSIS — R809 Proteinuria, unspecified: Secondary | ICD-10-CM | POA: Diagnosis not present

## 2021-10-01 DIAGNOSIS — E785 Hyperlipidemia, unspecified: Secondary | ICD-10-CM

## 2021-10-01 DIAGNOSIS — R7301 Impaired fasting glucose: Secondary | ICD-10-CM | POA: Diagnosis not present

## 2021-10-01 DIAGNOSIS — K8689 Other specified diseases of pancreas: Secondary | ICD-10-CM | POA: Diagnosis not present

## 2021-10-01 DIAGNOSIS — R110 Nausea: Secondary | ICD-10-CM | POA: Diagnosis not present

## 2021-10-01 DIAGNOSIS — E782 Mixed hyperlipidemia: Secondary | ICD-10-CM | POA: Diagnosis not present

## 2021-10-01 DIAGNOSIS — H9193 Unspecified hearing loss, bilateral: Secondary | ICD-10-CM | POA: Diagnosis not present

## 2021-10-01 DIAGNOSIS — G250 Essential tremor: Secondary | ICD-10-CM | POA: Diagnosis not present

## 2021-10-01 DIAGNOSIS — R1310 Dysphagia, unspecified: Secondary | ICD-10-CM | POA: Diagnosis not present

## 2021-10-01 MED ORDER — ROSUVASTATIN CALCIUM 40 MG PO TABS
40.0000 mg | ORAL_TABLET | Freq: Every day | ORAL | 3 refills | Status: DC
Start: 2021-10-01 — End: 2022-07-02

## 2021-10-01 NOTE — Telephone Encounter (Signed)
Pt notified and verbalized understanding. Pt had no questions or concerns at this time.  

## 2021-10-01 NOTE — Telephone Encounter (Signed)
Pt c/o medication issue:  1. Name of Medication:  atorvastatin (LIPITOR) tablet 80 mg  rosuvastatin (CRESTOR) 20 MG tablet    2. How are you currently taking this medication (dosage and times per day)? Is only taking Crestor as prescribed   3. Are you having a reaction (difficulty breathing--STAT)? No   4. What is your medication issue? Patient is wanting to know which medication Dr. Domenic Polite is wanting her to take. She states she has both medications and didn't know if he'd want her to start back on the atorvastatin since her cholesterol is up.

## 2021-10-01 NOTE — Telephone Encounter (Signed)
Per Dr. Domenic Polite: Please increase Crestor to 40 mg daily.  Recheck FLP in 3 months

## 2021-10-18 ENCOUNTER — Other Ambulatory Visit: Payer: Self-pay

## 2021-10-18 ENCOUNTER — Ambulatory Visit (HOSPITAL_COMMUNITY)
Admission: RE | Admit: 2021-10-18 | Discharge: 2021-10-18 | Disposition: A | Discharge status: Home / Self Care | Payer: Medicare Other | Source: Ambulatory Visit | Attending: Internal Medicine | Admitting: Internal Medicine

## 2021-10-18 DIAGNOSIS — K862 Cyst of pancreas: Secondary | ICD-10-CM | POA: Diagnosis not present

## 2021-10-18 DIAGNOSIS — K869 Disease of pancreas, unspecified: Secondary | ICD-10-CM | POA: Diagnosis not present

## 2021-10-18 DIAGNOSIS — K7689 Other specified diseases of liver: Secondary | ICD-10-CM | POA: Diagnosis not present

## 2021-10-18 LAB — POCT I-STAT CREATININE: Creatinine, Ser: 0.7 mg/dL (ref 0.44–1.00)

## 2021-10-18 MED ORDER — IOHEXOL 300 MG/ML  SOLN
100.0000 mL | Freq: Once | INTRAMUSCULAR | Status: AC | PRN
  Administered 2021-10-18: 85 mL via INTRAVENOUS

## 2021-10-27 DIAGNOSIS — I2510 Atherosclerotic heart disease of native coronary artery without angina pectoris: Secondary | ICD-10-CM | POA: Diagnosis not present

## 2021-10-27 DIAGNOSIS — R0602 Shortness of breath: Secondary | ICD-10-CM | POA: Diagnosis not present

## 2021-11-13 ENCOUNTER — Telehealth: Payer: Self-pay | Admitting: Cardiology

## 2021-11-13 NOTE — Telephone Encounter (Signed)
Confirmed that pt is to have lab work done fasting Dec 30, 2021 between 8 am and 3:30pm.  ?

## 2021-11-13 NOTE — Telephone Encounter (Signed)
Needing confirmation as to when pt is suppose to have labs done. Please advise ?

## 2021-11-14 ENCOUNTER — Ambulatory Visit (INDEPENDENT_AMBULATORY_CARE_PROVIDER_SITE_OTHER): Payer: Medicare Other

## 2021-11-14 DIAGNOSIS — I495 Sick sinus syndrome: Secondary | ICD-10-CM

## 2021-11-14 LAB — CUP PACEART REMOTE DEVICE CHECK
Battery Impedance: 3317 Ohm
Battery Remaining Longevity: 17 mo
Battery Voltage: 2.71 V
Brady Statistic AP VP Percent: 53 %
Brady Statistic AP VS Percent: 42 %
Brady Statistic AS VP Percent: 0 %
Brady Statistic AS VS Percent: 5 %
Date Time Interrogation Session: 20230413140650
Implantable Lead Implant Date: 20100526
Implantable Lead Implant Date: 20100526
Implantable Lead Location: 753859
Implantable Lead Location: 753860
Implantable Lead Model: 5076
Implantable Lead Model: 5076
Implantable Pulse Generator Implant Date: 20100526
Lead Channel Impedance Value: 428 Ohm
Lead Channel Impedance Value: 464 Ohm
Lead Channel Pacing Threshold Amplitude: 0.625 V
Lead Channel Pacing Threshold Amplitude: 1 V
Lead Channel Pacing Threshold Pulse Width: 0.4 ms
Lead Channel Pacing Threshold Pulse Width: 0.4 ms
Lead Channel Setting Pacing Amplitude: 2 V
Lead Channel Setting Pacing Amplitude: 2.5 V
Lead Channel Setting Pacing Pulse Width: 0.4 ms
Lead Channel Setting Sensing Sensitivity: 2.8 mV

## 2021-11-20 ENCOUNTER — Other Ambulatory Visit: Payer: Self-pay | Admitting: Cardiology

## 2021-11-27 DIAGNOSIS — I2510 Atherosclerotic heart disease of native coronary artery without angina pectoris: Secondary | ICD-10-CM | POA: Diagnosis not present

## 2021-11-27 DIAGNOSIS — R0602 Shortness of breath: Secondary | ICD-10-CM | POA: Diagnosis not present

## 2021-12-02 ENCOUNTER — Encounter: Payer: Self-pay | Admitting: Cardiology

## 2021-12-02 ENCOUNTER — Ambulatory Visit (INDEPENDENT_AMBULATORY_CARE_PROVIDER_SITE_OTHER): Payer: Medicare Other | Admitting: Cardiology

## 2021-12-02 VITALS — BP 118/84 | HR 86 | Ht 65.5 in | Wt 135.6 lb

## 2021-12-02 DIAGNOSIS — I495 Sick sinus syndrome: Secondary | ICD-10-CM | POA: Diagnosis not present

## 2021-12-02 DIAGNOSIS — I25119 Atherosclerotic heart disease of native coronary artery with unspecified angina pectoris: Secondary | ICD-10-CM

## 2021-12-02 DIAGNOSIS — I480 Paroxysmal atrial fibrillation: Secondary | ICD-10-CM

## 2021-12-02 NOTE — Progress Notes (Signed)
? ? ?Cardiology Office Note ? ?Date: 12/02/2021  ? ?ID: [name], DOB May 28, 1936, MRN [id number] ? ?PCP:  Celene Squibb, MD  ?Cardiologist:  Rozann Lesches, MD ?Electrophysiologist:  [name], MD  ? ?Chief Complaint  ?Patient presents with  ? Cardiac follow-up  ? ? ?History of Present Illness: ?[name] is an 86 y.o. female last seen in January.  She is here today with a friend for a routine follow-up visit.  She does not report any angina symptoms on current medical therapy.  Chronically short of breath at NYHA class III.   ? ?She anticipates GI follow-up with Dr. Laural Golden tomorrow to discuss possibility of diagnostic colonoscopy.  She also has cystic lesions within the pancreatic head and uncinate that are being followed by CT imaging at this point. ? ?Medtronic pacemaker in place with follow-up by Dr. Lovena Le.  Device check in April revealed normal function. ? ?She underwent complex PCI of the RCA in October 2022 using Shockwave Lithotripsy and ScoreFlex angioplasty with DES x2 to the mid to distal RCA and angioplasty alone of the ostial/proximal RCA in-stent restenosis.  She had previously been felt to be a poor operative candidate on evaluation by Dr. Roxan Hockey. ? ?CHA2DS2-VASc score is 5, she has declined anticoagulation. ? ?Past Medical History:  ?Diagnosis Date  ? Anemia   ? Anxiety   ? Atrial fibrillation   ? Chronic back pain   ? COPD (chronic obstructive pulmonary disease) ([place])   ? Coronary atherosclerosis of native coronary artery   ? DES x 2 to RCA 10/10  ? 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   ? Essential hypertension   ? GERD (gastroesophageal reflux disease)   ? H/O hiatal hernia   ? Headache(784.0)   ? HOH (hard of hearing)   ? Hyperlipidemia   ? Neuromuscular disorder ([place])   ? Tremors  ? Pericardial effusion   ? Hemorrhagic   ? Presence of permanent cardiac pacemaker   ? Tachycardia-bradycardia syndrome ([place])   ? s/p Medtronic Adapta L dual chamber device  5/10   ? ? ?Past Surgical History:  ?Procedure Laterality Date  ? APPENDECTOMY    ? CARDIAC CATHETERIZATION  2010  ? stents x2.  ? CATARACT EXTRACTION    ? CHOLECYSTECTOMY    ? COLONOSCOPY  2011  ? COLONOSCOPY N/A 10/19/2014  ? Procedure: COLONOSCOPY;  Surgeon: Rogene Houston, MD;  [facility];  Service: Endoscopy;  Laterality: N/A;  1030  ? CORONARY STENT INTERVENTION N/A 05/28/2021  ? Procedure: CORONARY STENT INTERVENTION;  Surgeon: Leonie Man, MD;  Location: [place] [facility];  Service: Cardiovascular;  Laterality: N/A;  ? ESOPHAGEAL DILATION N/A 05/02/2020  ? Procedure: ESOPHAGEAL DILATION;  Surgeon: Rogene Houston, MD;  [facility];  Service: Gastroenterology;  Laterality: N/A;  ? ESOPHAGOGASTRODUODENOSCOPY N/A 10/26/2014  ? Procedure: ESOPHAGOGASTRODUODENOSCOPY (EGD);  Surgeon: Rogene Houston, MD;  [facility];  Service: Endoscopy;  Laterality: N/A;  59 - Dr. has lunch and learn  ? ESOPHAGOGASTRODUODENOSCOPY (EGD) WITH PROPOFOL N/A 05/02/2020  ? Procedure: ESOPHAGOGASTRODUODENOSCOPY (EGD) WITH PROPOFOL;  Surgeon: Rogene Houston, MD;  [facility];  Service: Gastroenterology;  Laterality: N/A;  250  ? Esophagogastroduodenoscopy with esophageal dilation  2004, 2006, 2007  ? GIVENS CAPSULE STUDY N/A 10/31/2014  ? Procedure: GIVENS CAPSULE STUDY;  Surgeon: Rogene Houston, MD;  [facility];  Service: Endoscopy;  Laterality: N/A;  730 --  pacemaker--needs monitoring--outpatient bed  ? INSERT / REPLACE / REMOVE PACEMAKER  2010  ? INTRAVASCULAR ULTRASOUND/IVUS N/A 05/28/2021  ? Procedure: Intravascular Ultrasound/IVUS;  Surgeon: Leonie Man, MD;  Location: [place] [facility];  Service: Cardiovascular;  Laterality: N/A;  ? LEFT HEART CATH AND CORONARY ANGIOGRAPHY N/A 03/21/2021  ? Procedure: LEFT HEART CATH AND CORONARY ANGIOGRAPHY;  Surgeon: Belva Crome, MD;  Location: [place] [facility];  Service: Cardiovascular;  Laterality: N/A;  ? LEFT HEART  CATHETERIZATION WITH CORONARY ANGIOGRAM N/A 11/17/2011  ? Procedure: LEFT HEART CATHETERIZATION WITH CORONARY ANGIOGRAM;  Surgeon: Sherren Mocha, MD;  Location: [facility] CATH LAB;  Service: Cardiovascular;  Laterality: N/A;  ? LEFT HEART CATHETERIZATION WITH CORONARY ANGIOGRAM N/A 09/26/2014  ? Procedure: LEFT HEART CATHETERIZATION WITH CORONARY ANGIOGRAM;  Surgeon: Leonie Man, MD;  Location: [facility] CATH LAB;  Service: Cardiovascular;  Laterality: N/A;  ? Right rotator cuff repair    ? SHOULDER ACROMIOPLASTY Right 05/30/2015  ? Procedure: RIGHT SHOULDER ACROMIOPLASTY;  Surgeon: Carole Civil, MD;  Location: [facility];  Service: Orthopedics;  Laterality: Right;  ? SHOULDER OPEN ROTATOR CUFF REPAIR Right 05/30/2015  ? Procedure: OPEN ROTATOR CUFF REPAIR RIGHT SHOULDER;  Surgeon: Carole Civil, MD;  Location: [facility];  Service: Orthopedics;  Laterality: Right;  ? SHOULDER OPEN ROTATOR CUFF REPAIR Left 10/22/2016  ? Procedure: ROTATOR CUFF REPAIR SHOULDER OPEN;  Surgeon: Carole Civil, MD;  Location: [facility];  Service: Orthopedics;  Laterality: Left;  ? Subxiphoid pericardial window  11/10  ? VAGINAL HYSTERECTOMY    ? ? ?Current Outpatient Medications  ?Medication Sig Dispense Refill  ? ALPRAZolam (XANAX) 1 MG tablet Take 1 mg by mouth 3 (three) times daily as needed for anxiety.    ? amiodarone (PACERONE) 200 MG tablet TAKE 1 TABLET ONCE DAILY ON MONDAY THROUGH SATURDAY, NONE ON SUNDAY. 72 tablet 3  ? aspirin EC 81 MG tablet Take 1 tablet (81 mg total) by mouth daily. Swallow whole. 90 tablet 3  ? Cyanocobalamin (VITAMIN B-12) 5000 MCG SUBL Take 5,000 mcg by mouth daily.    ? dicyclomine (BENTYL) 10 MG capsule TAKE (1) CAPSULE BY MOUTH TWICE DAILY. 60 capsule 0  ? folic acid (FOLVITE) 725 MCG tablet Take 400 mcg by mouth daily.    ? furosemide (LASIX) 40 MG tablet Take 1 tablet (40 mg total) by mouth daily as needed for fluid or edema. 30 tablet 0  ? isosorbide mononitrate (IMDUR) 30 MG 24 hr tablet Take 2 tablets  (60 mg total) by mouth daily. 180 tablet 2  ? JARDIANCE 10 MG TABS tablet Take 10 mg by mouth daily.    ? metoprolol succinate (TOPROL-XL) 25 MG 24 hr tablet TAKE 3 TABLETS BY MOUTH IN THE MORNING AND 2 TABLETS IN THE EVENING. 150 tablet 2  ? Multiple Vitamin (MULTIVITAMIN WITH MINERALS) TABS tablet Take 1 tablet by mouth daily.     ? nitroGLYCERIN (NITROSTAT) 0.4 MG SL tablet Place 1 tablet (0.4 mg total) under the tongue every 5 (five) minutes x 3 doses as needed for chest pain. 30 tablet 12  ? ondansetron (ZOFRAN) 4 MG tablet Take 4 mg by mouth every 6 (six) hours as needed for nausea or vomiting.    ? pantoprazole (PROTONIX) 40 MG tablet TAKE (1) TABLET BY MOUTH ONCE DAILY. 90 tablet 3  ? potassium chloride SA (K-DUR,KLOR-CON) 20 MEQ tablet Take 20 mEq by mouth See admin instructions. Takes 1 tablet (20 meq) by mouth daily (scheduled) &  may take a second dose if she has a second dose of Lasix    ? primidone (MYSOLINE) 50 MG tablet Take 50 mg by mouth at bedtime.    ? PROAIR HFA 108 (90 Base) MCG/ACT inhaler Inhale 1 puff into the lungs every 4 (four) hours as needed for wheezing or shortness of breath.     ? ranolazine (RANEXA) 500 MG 12 hr tablet Take 1 tablet (500 mg total) by mouth 2 (two) times daily. 60 tablet 11  ? rosuvastatin (CRESTOR) 40 MG tablet Take 1 tablet (40 mg total) by mouth daily. 90 tablet 3  ? senna-docusate (SENOKOT-S) 8.6-50 MG tablet Take 1 tablet by mouth at bedtime. (Patient taking differently: Take 1 tablet by mouth daily as needed (constipation.).) 30 tablet 0  ? ticagrelor (BRILINTA) 90 MG TABS tablet Take 1 tablet (90 mg total) by mouth 2 (two) times daily. 60 tablet 11  ? VITAMIN D, CHOLECALCIFEROL, PO Take 1 tablet by mouth daily.    ? ?No current facility-administered medications for this visit.  ? ?Allergies:  Amitriptyline hcl, Plavix [clopidogrel bisulfate], and Sulfonamide derivatives  ? ?ROS: No palpitations or syncope. ? ?Physical Exam: ?VS:  BP 118/84   Pulse 86   Ht 5'  5.5" (1.664 m)   Wt 135 lb 9.6 oz (61.5 kg)   SpO2 91%   BMI 22.22 kg/m? , BMI Body mass index is 22.22 kg/m?. ? ?Wt Readings from Last 3 Encounters:  ?12/02/21 135 lb 9.6 oz (61.5 kg)  ?08/30/21 132 lb (59.9 k

## 2021-12-02 NOTE — Progress Notes (Signed)
Remote pacemaker transmission.   

## 2021-12-02 NOTE — Patient Instructions (Signed)

## 2021-12-03 ENCOUNTER — Encounter (INDEPENDENT_AMBULATORY_CARE_PROVIDER_SITE_OTHER): Payer: Self-pay | Admitting: Internal Medicine

## 2021-12-03 ENCOUNTER — Ambulatory Visit (INDEPENDENT_AMBULATORY_CARE_PROVIDER_SITE_OTHER): Payer: Medicare Other | Admitting: Internal Medicine

## 2021-12-03 VITALS — BP 132/88 | HR 98 | Temp 97.9°F | Ht 65.5 in | Wt 136.2 lb

## 2021-12-03 DIAGNOSIS — R1030 Lower abdominal pain, unspecified: Secondary | ICD-10-CM

## 2021-12-03 DIAGNOSIS — K869 Disease of pancreas, unspecified: Secondary | ICD-10-CM

## 2021-12-03 DIAGNOSIS — K219 Gastro-esophageal reflux disease without esophagitis: Secondary | ICD-10-CM

## 2021-12-03 DIAGNOSIS — G8929 Other chronic pain: Secondary | ICD-10-CM | POA: Insufficient documentation

## 2021-12-03 DIAGNOSIS — R1010 Upper abdominal pain, unspecified: Secondary | ICD-10-CM

## 2021-12-03 DIAGNOSIS — R1319 Other dysphagia: Secondary | ICD-10-CM

## 2021-12-03 MED ORDER — DICYCLOMINE HCL 10 MG PO CAPS: 10.0000 mg | ORAL_CAPSULE | Freq: Two times a day (BID) | ORAL | 2 refills | Status: DC | PRN

## 2021-12-03 NOTE — Progress Notes (Signed)
Presenting complaint; ? ?Follow-up for chronic GERD dysphagia and abdominal pain.  History of pancreatic lesion. ? ? ?Database and subjective: ? ?Patient is 86 year old Caucasian female who is here for scheduled visit accompanied by her friend Otila Kluver.  She was last seen in December 2022. ?She says heartburn is well controlled with therapy.  She only has an episode with certain foods.  She remains with dysphagia to solids.  She says she rarely eats meat.  She feels dysphagia has not gotten any worse.  She has not had an episode of food impaction lately.  She continues to experience nausea and uses ondansetron almost daily.  She says pain in right lower quadrant of her abdomen eases with dicyclomine.  She generally takes 1 tablet every morning and occasionally take a second dose.  She has 1-2 formed stools daily.  She denies melena or rectal bleeding.  She is also complaining of pain across her upper abdomen which comes and goes and seems to be triggered with food. ? ?She has history of cystic pancreatic lesions discovered last year and she had follow-up CT in March 2023. ? ?Current Medications: ?Outpatient Encounter Medications as of 12/03/2021  ?Medication Sig  ? ALPRAZolam (XANAX) 1 MG tablet Take 1 mg by mouth 3 (three) times daily as needed for anxiety.  ? amiodarone (PACERONE) 200 MG tablet TAKE 1 TABLET ONCE DAILY ON MONDAY THROUGH SATURDAY, NONE ON SUNDAY.  ? aspirin EC 81 MG tablet Take 1 tablet (81 mg total) by mouth daily. Swallow whole.  ? Cyanocobalamin (VITAMIN B-12) 5000 MCG SUBL Take 5,000 mcg by mouth daily.  ? dicyclomine (BENTYL) 10 MG capsule TAKE (1) CAPSULE BY MOUTH TWICE DAILY.  ? folic acid (FOLVITE) 440 MCG tablet Take 400 mcg by mouth daily.  ? furosemide (LASIX) 40 MG tablet Take 1 tablet (40 mg total) by mouth daily as needed for fluid or edema.  ? isosorbide mononitrate (IMDUR) 30 MG 24 hr tablet Take 2 tablets (60 mg total) by mouth daily.  ? JARDIANCE 10 MG TABS tablet Take 10 mg by mouth  daily.  ? metoprolol succinate (TOPROL-XL) 25 MG 24 hr tablet TAKE 3 TABLETS BY MOUTH IN THE MORNING AND 2 TABLETS IN THE EVENING.  ? Multiple Vitamin (MULTIVITAMIN WITH MINERALS) TABS tablet Take 1 tablet by mouth daily.   ? nitroGLYCERIN (NITROSTAT) 0.4 MG SL tablet Place 1 tablet (0.4 mg total) under the tongue every 5 (five) minutes x 3 doses as needed for chest pain.  ? ondansetron (ZOFRAN) 4 MG tablet Take 4 mg by mouth every 6 (six) hours as needed for nausea or vomiting.  ? pantoprazole (PROTONIX) 40 MG tablet TAKE (1) TABLET BY MOUTH ONCE DAILY.  ? potassium chloride SA (K-DUR,KLOR-CON) 20 MEQ tablet Take 20 mEq by mouth See admin instructions. Takes 1 tablet (20 meq) by mouth daily (scheduled) & may take a second dose if she has a second dose of Lasix  ? primidone (MYSOLINE) 50 MG tablet Take 50 mg by mouth at bedtime.  ? PROAIR HFA 108 (90 Base) MCG/ACT inhaler Inhale 1 puff into the lungs every 4 (four) hours as needed for wheezing or shortness of breath.   ? rosuvastatin (CRESTOR) 40 MG tablet Take 1 tablet (40 mg total) by mouth daily.  ? senna-docusate (SENOKOT-S) 8.6-50 MG tablet Take 1 tablet by mouth at bedtime. (Patient taking differently: Take 1 tablet by mouth daily as needed (constipation.).)  ? VITAMIN D, CHOLECALCIFEROL, PO Take 1 tablet by mouth daily.  ?  vitamin E 180 MG (400 UNITS) capsule Take 400 Units by mouth daily.  ? ranolazine (RANEXA) 500 MG 12 hr tablet Take 1 tablet (500 mg total) by mouth 2 (two) times daily. (Patient not taking: Reported on 12/03/2021)  ? ticagrelor (BRILINTA) 90 MG TABS tablet Take 1 tablet (90 mg total) by mouth 2 (two) times daily. (Patient not taking: Reported on 12/03/2021)  ? ?No facility-administered encounter medications on file as of 12/03/2021.  ? ? ? ?Objective: ?Blood pressure 132/88, pulse 98, temperature 97.9 ?F (36.6 ?C), temperature source Oral, height 5' 5.5" (1.664 m), weight 136 lb 3.2 oz (61.8 kg). ?Patient is alert and in no acute distress. ?She  remains with head tremor. ?She has hearing impairment. ?Conjunctiva is pink. Sclera is nonicteric ?Oropharyngeal mucosa is normal. ?She has upper dentures in place. ?No neck masses or thyromegaly noted. ?Cardiac exam with regular rhythm normal S1 and S2. No murmur or gallop noted. ?Lungs are clear to auscultation. ?Abdomen is symmetrical.  On palpation abdomen is soft.  She has mild tenderness midepigastrium and right lower quadrant.  No organomegaly or masses. ?No LE edema or clubbing noted. ? ?Labs/studies Results: ? ? ? ?  Latest Ref Rng & Units 05/29/2021  ?  2:35 AM 05/28/2021  ? 10:48 AM 03/31/2021  ?  7:08 AM  ?CBC  ?WBC 4.0 - 10.5 K/uL 7.7   7.0   6.9    ?Hemoglobin 12.0 - 15.0 g/dL 12.5   13.5   12.9    ?Hematocrit 36.0 - 46.0 % 39.6   42.5   40.5    ?Platelets 150 - 400 K/uL 212   254   218    ?  ? ?  Latest Ref Rng & Units 10/18/2021  ? 11:45 AM 05/29/2021  ?  2:35 AM 05/28/2021  ? 10:48 AM  ?CMP  ?Glucose 70 - 99 mg/dL  110   90    ?BUN 8 - 23 mg/dL  11   10    ?Creatinine 0.44 - 1.00 mg/dL 0.70   0.64   0.74    ?Sodium 135 - 145 mmol/L  137   138    ?Potassium 3.5 - 5.1 mmol/L  3.7   4.6    ?Chloride 98 - 111 mmol/L  105   103    ?CO2 22 - 32 mmol/L  25   28    ?Calcium 8.9 - 10.3 mg/dL  9.1   9.3    ?  ? ?  Latest Ref Rng & Units 03/19/2021  ?  8:50 PM 06/01/2018  ? 12:50 PM 05/25/2017  ?  8:15 PM  ?Hepatic Function  ?Total Protein 6.5 - 8.1 g/dL 8.1   8.3   7.9    ?Albumin 3.5 - 5.0 g/dL 3.9   4.0   4.0    ?AST 15 - 41 U/L '28   23   25    ' ?ALT 0 - 44 U/L '20   14   20    ' ?Alk Phosphatase 38 - 126 U/L 85   80   84    ?Total Bilirubin 0.3 - 1.2 mg/dL 0.4   0.7   0.3    ?  ? ?CT abdomen with and without contrast 10/18/2021. ? ?Pancreas: Slight interval enlargement of fluid attenuation cystic ?lesions within the central pancreatic head measuring 1.8 x 1.3 cm, ?previously 1.5 x 1.1 cm when measured similarly (series 9, image ?34), within the pancreatic uncinate measuring 1.7 x 1.4 cm, ?previously  1.2 x 1.1 cm  (series 9, image 40). Unchanged 1.0 x 0.8 cm ?lesion of the most inferior pancreatic uncinate (series 9, image ?20). Mild, diffuse prominence of the pancreatic duct along its ?length, measuring up to 0.5 cm. No inflammatory findings ? ?Assessment: ? ?#1.  Chronic GERD.  Heartburn is well controlled with therapy. ? ?#2.  Esophageal dysphagia.  She has not responded to esophageal dilation in the past.  She is suspected to have esophageal motility disorder.  Last EGD was in September 2021 revealing nonobstructive Schatzki's ring 3 cm sliding hiatal hernia and esophageal dilation resulted in mucosal disruption of cervical esophagus indicative of a web.  She did not respond to dilation. ? ?#3.  Pancreatic lesions..  Both cystic lesions have increased in size since prior study of August 2022.  Pancreatic head lesion is increased by about 3 mm and lesion in uncinate process as increased by 5 mm.  He also has diffuse dilation of the pancreatic duct.  She is having intermittent upper abdominal pain which may or may not be secondary to these lesions.  Suspect IPMN.  Since lesions are increasing in size and she is symptomatic EUS may be appropriate but I am not sure that she is a candidate for major surgery. ? ? ?#4.  Upper abdominal pain.  It remains to be seen if pain is due to dyspepsia IBS or secondary pancreatic lesions. ? ?Right lower quadrant abdominal pain.  She is getting relief with dicyclomine at a low dose.  ? ? ?Plan: ? ?Patient will go to the lab for LFTs serum amylase and lipase. ?Continue pantoprazole at a dose of 40 mg by mouth 30 minutes before breakfast daily. ?Continue dicyclomine 10 mg before breakfast daily with second dose on as-needed basis. ?Will contact patient and/or her friend Otila Kluver with results and further recommendations. ?We will touch base with Dr. Ardis Hughs about doing an EUS. ?Office visit in 6 months. ? ? ? ?  ?

## 2021-12-03 NOTE — Patient Instructions (Addendum)
Physician will call with results of blood test and further recommendations regarding pancreatic cystic lesion. ?

## 2021-12-04 DIAGNOSIS — R1010 Upper abdominal pain, unspecified: Secondary | ICD-10-CM | POA: Diagnosis not present

## 2021-12-05 ENCOUNTER — Encounter (INDEPENDENT_AMBULATORY_CARE_PROVIDER_SITE_OTHER): Payer: Self-pay | Admitting: Internal Medicine

## 2021-12-05 LAB — AMYLASE: Amylase: 43 U/L (ref 21–101)

## 2021-12-05 LAB — HEPATIC FUNCTION PANEL
AG Ratio: 1.2 (calc) (ref 1.0–2.5)
ALT: 14 U/L (ref 6–29)
AST: 22 U/L (ref 10–35)
Albumin: 4 g/dL (ref 3.6–5.1)
Alkaline phosphatase (APISO): 73 U/L (ref 37–153)
Bilirubin, Direct: 0.1 mg/dL (ref 0.0–0.2)
Globulin: 3.4 g/dL (calc) (ref 1.9–3.7)
Indirect Bilirubin: 0.3 mg/dL (calc) (ref 0.2–1.2)
Total Bilirubin: 0.4 mg/dL (ref 0.2–1.2)
Total Protein: 7.4 g/dL (ref 6.1–8.1)

## 2021-12-05 LAB — LIPASE: Lipase: 50 U/L (ref 7–60)

## 2021-12-12 ENCOUNTER — Telehealth: Payer: Self-pay

## 2021-12-12 ENCOUNTER — Other Ambulatory Visit: Payer: Self-pay

## 2021-12-12 DIAGNOSIS — K869 Disease of pancreas, unspecified: Secondary | ICD-10-CM

## 2021-12-12 NOTE — Telephone Encounter (Signed)
[facility] Pre-operative Risk Assessment  ?   ?Request for surgical clearance:     Endoscopy Procedure ? ?What type of surgery is being performed?     EUS  ? ?When is this surgery scheduled?     02/24/22 ? ?What type of clearance is required ?   Pharmacy ? ?Are there any medications that need to be held prior to surgery and how long? Brilinta  ? ?Practice name and name of physician performing surgery?      [facility] ? ?What is your office phone and fax number?      Phone- [phone]  Fax- [phone] ? ?Anesthesia type (None, local, MAC, general) ?       MAC  ?

## 2021-12-12 NOTE — Telephone Encounter (Signed)
EUS has been scheduled for 02/24/22 at 1 pm at [facility] with GM ?[place] letter sent  ? ?Left message on machine to call back  ?

## 2021-12-12 NOTE — Telephone Encounter (Signed)
Primary Cardiologist: Rozann Lesches, MD ?  ?Chart reviewed as part of pre-operative protocol coverage. Given past medical history and time since last visit, based on ACC/AHA guidelines, Janautica B Majid would be at acceptable risk for the planned procedure without further cardiovascular testing.  ?  ?Her Brilinta may be held for 5 days prior to her procedure.  Please resume as soon as hemostasis is achieved. ?  ?I will route this recommendation to the requesting party via Epic fax function and remove from pre-op pool. ?  ?Please call with questions. ?  ?Jossie Ng. Cleaver NP-C ?  ?

## 2021-12-12 NOTE — Telephone Encounter (Signed)
-----   Message from Milus Banister, MD sent at 12/12/2021  5:58 AM EDT ----- ?Regarding: RE: EUS ?I reviewed recent CT images and also D. Rehman's recent OV note.  Seems reasonable to proceed with EUS.  Please offer her next available with either myself or GM for abnormal pancreas. ? ?Thanks ? ?[name] ? ?----- Message ----- ?From: Timothy Lasso, RN ?Sent: 12/11/2021   8:49 AM EDT ?To: Milus Banister, MD ?Subject: FW: EUS                                       ? ? ?----- Message ----- ?From: Worthy Keeler ?Sent: 12/11/2021   8:40 AM EDT ?To: Rogene Houston, MD, Timothy Lasso, RN ?Subject: EUS                                           ? ?Per Dr Laural Golden - Please send request to Dr. Ardis Hughs for pancreatic EUS for enlarging pancreatic lesions. Thanks ? ? ? ?

## 2021-12-12 NOTE — Telephone Encounter (Signed)
Tried again to reach pt and the line states voicemail not set up will try later  ?

## 2021-12-12 NOTE — Telephone Encounter (Signed)
? ?  Primary Cardiologist: Rozann Lesches, MD ? ?Chart reviewed as part of pre-operative protocol coverage. Given past medical history and time since last visit, based on ACC/AHA guidelines, [name] would be at acceptable risk for the planned procedure without further cardiovascular testing.  ? ?Her Brilinta may be held for 5 days prior to her procedure.  Please resume as soon as hemostasis is achieved. ? ?I will route this recommendation to the requesting party via Epic fax function and remove from pre-op pool. ? ?Please call with questions. ? ?Jossie Ng. Jarvis Knodel NP-C ? ?  ?12/12/2021, 11:40 AM ?[place] ?[address] ?Office [phone] Fax [phone] ? ? ? ? ?

## 2021-12-13 NOTE — Telephone Encounter (Signed)
I spoke to the pt caregiver and she has been given the EUS and Brilinta instructions.  She will call if she has any questions after reviewing the written instructions.  ?

## 2021-12-27 DIAGNOSIS — I2510 Atherosclerotic heart disease of native coronary artery without angina pectoris: Secondary | ICD-10-CM | POA: Diagnosis not present

## 2021-12-27 DIAGNOSIS — R0602 Shortness of breath: Secondary | ICD-10-CM | POA: Diagnosis not present

## 2022-01-06 DIAGNOSIS — R7301 Impaired fasting glucose: Secondary | ICD-10-CM | POA: Diagnosis not present

## 2022-01-06 DIAGNOSIS — E782 Mixed hyperlipidemia: Secondary | ICD-10-CM | POA: Diagnosis not present

## 2022-01-10 DIAGNOSIS — I2510 Atherosclerotic heart disease of native coronary artery without angina pectoris: Secondary | ICD-10-CM | POA: Diagnosis not present

## 2022-01-10 DIAGNOSIS — I480 Paroxysmal atrial fibrillation: Secondary | ICD-10-CM | POA: Diagnosis not present

## 2022-01-10 DIAGNOSIS — E782 Mixed hyperlipidemia: Secondary | ICD-10-CM | POA: Diagnosis not present

## 2022-01-10 DIAGNOSIS — K8689 Other specified diseases of pancreas: Secondary | ICD-10-CM | POA: Diagnosis not present

## 2022-01-10 DIAGNOSIS — R110 Nausea: Secondary | ICD-10-CM | POA: Diagnosis not present

## 2022-01-10 DIAGNOSIS — I10 Essential (primary) hypertension: Secondary | ICD-10-CM | POA: Diagnosis not present

## 2022-01-10 DIAGNOSIS — H9193 Unspecified hearing loss, bilateral: Secondary | ICD-10-CM | POA: Diagnosis not present

## 2022-01-10 DIAGNOSIS — G250 Essential tremor: Secondary | ICD-10-CM | POA: Diagnosis not present

## 2022-01-10 DIAGNOSIS — R1310 Dysphagia, unspecified: Secondary | ICD-10-CM | POA: Diagnosis not present

## 2022-01-10 DIAGNOSIS — R7301 Impaired fasting glucose: Secondary | ICD-10-CM | POA: Diagnosis not present

## 2022-01-10 DIAGNOSIS — R809 Proteinuria, unspecified: Secondary | ICD-10-CM | POA: Diagnosis not present

## 2022-01-27 DIAGNOSIS — R0602 Shortness of breath: Secondary | ICD-10-CM | POA: Diagnosis not present

## 2022-01-27 DIAGNOSIS — I2510 Atherosclerotic heart disease of native coronary artery without angina pectoris: Secondary | ICD-10-CM | POA: Diagnosis not present

## 2022-01-31 DIAGNOSIS — I2510 Atherosclerotic heart disease of native coronary artery without angina pectoris: Secondary | ICD-10-CM | POA: Diagnosis not present

## 2022-01-31 DIAGNOSIS — I10 Essential (primary) hypertension: Secondary | ICD-10-CM | POA: Diagnosis not present

## 2022-01-31 DIAGNOSIS — E782 Mixed hyperlipidemia: Secondary | ICD-10-CM | POA: Diagnosis not present

## 2022-01-31 DIAGNOSIS — I480 Paroxysmal atrial fibrillation: Secondary | ICD-10-CM | POA: Diagnosis not present

## 2022-02-13 ENCOUNTER — Ambulatory Visit (INDEPENDENT_AMBULATORY_CARE_PROVIDER_SITE_OTHER): Payer: Medicare Other

## 2022-02-13 DIAGNOSIS — I495 Sick sinus syndrome: Secondary | ICD-10-CM

## 2022-02-14 LAB — CUP PACEART REMOTE DEVICE CHECK
Battery Impedance: 4029 Ohm
Battery Remaining Longevity: 12 mo
Battery Voltage: 2.68 V
Brady Statistic AP VP Percent: 53 %
Brady Statistic AP VS Percent: 40 %
Brady Statistic AS VP Percent: 0 %
Brady Statistic AS VS Percent: 7 %
Date Time Interrogation Session: 20230713134027
Implantable Lead Implant Date: 20100526
Implantable Lead Implant Date: 20100526
Implantable Lead Location: 753859
Implantable Lead Location: 753860
Implantable Lead Model: 5076
Implantable Lead Model: 5076
Implantable Pulse Generator Implant Date: 20100526
Lead Channel Impedance Value: 449 Ohm
Lead Channel Impedance Value: 495 Ohm
Lead Channel Pacing Threshold Amplitude: 0.625 V
Lead Channel Pacing Threshold Amplitude: 1 V
Lead Channel Pacing Threshold Pulse Width: 0.4 ms
Lead Channel Pacing Threshold Pulse Width: 0.4 ms
Lead Channel Setting Pacing Amplitude: 2 V
Lead Channel Setting Pacing Amplitude: 2.5 V
Lead Channel Setting Pacing Pulse Width: 0.4 ms
Lead Channel Setting Sensing Sensitivity: 2.8 mV

## 2022-02-17 ENCOUNTER — Encounter (HOSPITAL_COMMUNITY): Payer: Self-pay | Admitting: Gastroenterology

## 2022-02-18 ENCOUNTER — Other Ambulatory Visit: Payer: Self-pay | Admitting: Cardiology

## 2022-02-24 ENCOUNTER — Ambulatory Visit (HOSPITAL_BASED_OUTPATIENT_CLINIC_OR_DEPARTMENT_OTHER): Payer: Medicare Other | Admitting: Anesthesiology

## 2022-02-24 ENCOUNTER — Ambulatory Visit (HOSPITAL_COMMUNITY): Payer: Medicare Other | Admitting: Anesthesiology

## 2022-02-24 ENCOUNTER — Encounter (HOSPITAL_COMMUNITY): Payer: Self-pay | Admitting: Gastroenterology

## 2022-02-24 ENCOUNTER — Ambulatory Visit (HOSPITAL_COMMUNITY)
Admission: RE | Admit: 2022-02-24 | Discharge: 2022-02-24 | Disposition: A | Discharge status: Home / Self Care | Payer: Medicare Other | Attending: Gastroenterology | Admitting: Gastroenterology

## 2022-02-24 ENCOUNTER — Encounter (HOSPITAL_COMMUNITY)
Admission: RE | Disposition: A | Discharge status: Home / Self Care | Payer: Self-pay | Source: Home / Self Care | Attending: Gastroenterology

## 2022-02-24 ENCOUNTER — Other Ambulatory Visit: Payer: Self-pay

## 2022-02-24 DIAGNOSIS — I10 Essential (primary) hypertension: Secondary | ICD-10-CM | POA: Insufficient documentation

## 2022-02-24 DIAGNOSIS — K838 Other specified diseases of biliary tract: Secondary | ICD-10-CM | POA: Diagnosis not present

## 2022-02-24 DIAGNOSIS — K3189 Other diseases of stomach and duodenum: Secondary | ICD-10-CM

## 2022-02-24 DIAGNOSIS — I252 Old myocardial infarction: Secondary | ICD-10-CM | POA: Diagnosis not present

## 2022-02-24 DIAGNOSIS — J449 Chronic obstructive pulmonary disease, unspecified: Secondary | ICD-10-CM | POA: Diagnosis not present

## 2022-02-24 DIAGNOSIS — K222 Esophageal obstruction: Secondary | ICD-10-CM | POA: Diagnosis not present

## 2022-02-24 DIAGNOSIS — Z87891 Personal history of nicotine dependence: Secondary | ICD-10-CM | POA: Diagnosis not present

## 2022-02-24 DIAGNOSIS — K449 Diaphragmatic hernia without obstruction or gangrene: Secondary | ICD-10-CM

## 2022-02-24 DIAGNOSIS — K2950 Unspecified chronic gastritis without bleeding: Secondary | ICD-10-CM | POA: Insufficient documentation

## 2022-02-24 DIAGNOSIS — Z950 Presence of cardiac pacemaker: Secondary | ICD-10-CM | POA: Diagnosis not present

## 2022-02-24 DIAGNOSIS — I2510 Atherosclerotic heart disease of native coronary artery without angina pectoris: Secondary | ICD-10-CM | POA: Insufficient documentation

## 2022-02-24 DIAGNOSIS — K2970 Gastritis, unspecified, without bleeding: Secondary | ICD-10-CM

## 2022-02-24 DIAGNOSIS — K8689 Other specified diseases of pancreas: Secondary | ICD-10-CM | POA: Diagnosis not present

## 2022-02-24 DIAGNOSIS — Z955 Presence of coronary angioplasty implant and graft: Secondary | ICD-10-CM | POA: Diagnosis not present

## 2022-02-24 DIAGNOSIS — I899 Noninfective disorder of lymphatic vessels and lymph nodes, unspecified: Secondary | ICD-10-CM | POA: Insufficient documentation

## 2022-02-24 DIAGNOSIS — K31A19 Gastric intestinal metaplasia without dysplasia, unspecified site: Secondary | ICD-10-CM | POA: Diagnosis not present

## 2022-02-24 DIAGNOSIS — Z9981 Dependence on supplemental oxygen: Secondary | ICD-10-CM | POA: Diagnosis not present

## 2022-02-24 DIAGNOSIS — K862 Cyst of pancreas: Secondary | ICD-10-CM | POA: Insufficient documentation

## 2022-02-24 HISTORY — PX: BIOPSY: SHX5522

## 2022-02-24 HISTORY — PX: FINE NEEDLE ASPIRATION: SHX6590

## 2022-02-24 HISTORY — PX: EUS: SHX5427

## 2022-02-24 HISTORY — PX: ESOPHAGOGASTRODUODENOSCOPY: SHX5428

## 2022-02-24 HISTORY — PX: SAVORY DILATION: SHX5439

## 2022-02-24 SURGERY — UPPER ENDOSCOPIC ULTRASOUND (EUS) LINEAR
Anesthesia: General

## 2022-02-24 MED ORDER — CIPROFLOXACIN HCL 500 MG PO TABS: 500.0000 mg | ORAL_TABLET | Freq: Two times a day (BID) | ORAL | 0 refills | Status: AC

## 2022-02-24 MED ORDER — LABETALOL HCL 5 MG/ML IV SOLN
5.0000 mg | Freq: Once | INTRAVENOUS | Status: AC
  Administered 2022-02-24: 5 mg via INTRAVENOUS

## 2022-02-24 MED ORDER — PROPOFOL 500 MG/50ML IV EMUL
INTRAVENOUS | Status: DC | PRN
  Administered 2022-02-24: 25 ug/kg/min via INTRAVENOUS

## 2022-02-24 MED ORDER — PROPOFOL 10 MG/ML IV BOLUS
INTRAVENOUS | Status: DC | PRN
  Administered 2022-02-24: 90 mg via INTRAVENOUS

## 2022-02-24 MED ORDER — FENTANYL CITRATE (PF) 100 MCG/2ML IJ SOLN
INTRAMUSCULAR | Status: AC
  Filled 2022-02-24: qty 2

## 2022-02-24 MED ORDER — CIPROFLOXACIN IN D5W 400 MG/200ML IV SOLN
INTRAVENOUS | Status: DC | PRN
  Administered 2022-02-24: 400 mg via INTRAVENOUS

## 2022-02-24 MED ORDER — ONDANSETRON HCL 4 MG/2ML IJ SOLN
INTRAMUSCULAR | Status: DC | PRN
  Administered 2022-02-24: 4 mg via INTRAVENOUS

## 2022-02-24 MED ORDER — PROPOFOL 1000 MG/100ML IV EMUL
INTRAVENOUS | Status: AC
  Filled 2022-02-24: qty 100

## 2022-02-24 MED ORDER — DEXAMETHASONE SODIUM PHOSPHATE 10 MG/ML IJ SOLN
INTRAMUSCULAR | Status: DC | PRN
  Administered 2022-02-24: 10 mg via INTRAVENOUS

## 2022-02-24 MED ORDER — LACTATED RINGERS IV SOLN: INTRAVENOUS | Status: DC

## 2022-02-24 MED ORDER — SUGAMMADEX SODIUM 200 MG/2ML IV SOLN
INTRAVENOUS | Status: DC | PRN
  Administered 2022-02-24: 200 mg via INTRAVENOUS

## 2022-02-24 MED ORDER — HYDRALAZINE HCL 20 MG/ML IJ SOLN
10.0000 mg | Freq: Once | INTRAMUSCULAR | Status: AC
  Administered 2022-02-24: 10 mg via INTRAVENOUS

## 2022-02-24 MED ORDER — TICAGRELOR 90 MG PO TABS: 90.0000 mg | ORAL_TABLET | Freq: Two times a day (BID) | ORAL | 11 refills | Status: DC

## 2022-02-24 MED ORDER — LABETALOL HCL 5 MG/ML IV SOLN
INTRAVENOUS | Status: AC
  Filled 2022-02-24: qty 4

## 2022-02-24 MED ORDER — HYDRALAZINE HCL 20 MG/ML IJ SOLN
INTRAMUSCULAR | Status: AC
  Filled 2022-02-24: qty 1

## 2022-02-24 MED ORDER — ROCURONIUM BROMIDE 10 MG/ML (PF) SYRINGE
PREFILLED_SYRINGE | INTRAVENOUS | Status: DC | PRN
  Administered 2022-02-24: 60 mg via INTRAVENOUS

## 2022-02-24 MED ORDER — AMOXICILLIN-POT CLAVULANATE 875-125 MG PO TABS: 1.0000 | ORAL_TABLET | Freq: Two times a day (BID) | ORAL | 0 refills | Status: AC

## 2022-02-24 MED ORDER — FENTANYL CITRATE (PF) 100 MCG/2ML IJ SOLN
INTRAMUSCULAR | Status: DC | PRN
  Administered 2022-02-24: 50 ug via INTRAVENOUS

## 2022-02-24 MED ORDER — LIDOCAINE 2% (20 MG/ML) 5 ML SYRINGE
INTRAMUSCULAR | Status: DC | PRN
  Administered 2022-02-24: 40 mg via INTRAVENOUS

## 2022-02-24 NOTE — Discharge Instructions (Addendum)
YOU HAD AN ENDOSCOPIC PROCEDURE TODAY: Refer to the procedure report and other information in the discharge instructions given to you for any specific questions about what was found during the examination. If this information does not answer your questions, please call [place] office at [phone] to clarify.   YOU SHOULD EXPECT: Some feelings of bloating in the abdomen. Passage of more gas than usual. Walking can help get rid of the air that was put into your GI tract during the procedure and reduce the bloating. If you had a lower endoscopy (such as a colonoscopy or flexible sigmoidoscopy) you may notice spotting of blood in your stool or on the toilet paper. Some abdominal soreness may be present for a day or two, also.  DIET: Your first meal following the procedure should be a light meal and then it is ok to progress to your normal diet. A half-sandwich or bowl of soup is an example of a good first meal. Heavy or fried foods are harder to digest and may make you feel nauseous or bloated. Drink plenty of fluids but you should avoid alcoholic beverages for 24 hours. If you had a esophageal dilation, please see attached instructions for diet.    ACTIVITY: Your care partner should take you home directly after the procedure. You should plan to take it easy, moving slowly for the rest of the day. You can resume normal activity the day after the procedure however YOU SHOULD NOT DRIVE, use power tools, machinery or perform tasks that involve climbing or major physical exertion for 24 hours (because of the sedation medicines used during the test).   SYMPTOMS TO REPORT IMMEDIATELY: A gastroenterologist can be reached at any hour. Please call [phone]  for any of the following symptoms:   Following upper endoscopy (EGD, EUS, ERCP, esophageal dilation) Vomiting of blood or coffee ground material  New, significant abdominal pain  New, significant chest pain or pain under the shoulder blades  Painful or  persistently difficult swallowing  New shortness of breath  Black, tarry-looking or red, bloody stools  FOLLOW UP:  If any biopsies were taken you will be contacted by phone or by letter within the next 1-3 weeks. Call [phone]  if you have not heard about the biopsies in 3 weeks.  Please also call with any specific questions about appointments or follow up tests.  

## 2022-02-24 NOTE — Transfer of Care (Signed)
Immediate Anesthesia Transfer of Care Note  Patient: [name]  Procedure(s) Performed: UPPER ENDOSCOPIC ULTRASOUND (EUS) LINEAR ESOPHAGOGASTRODUODENOSCOPY (EGD) BIOPSY FINE NEEDLE ASPIRATION SAVORY DILATION  Patient Location: PACU  Anesthesia Type:General  Level of Consciousness: sedated  Airway & Oxygen Therapy: Patient Spontanous Breathing and Patient connected to face mask oxygen  Post-op Assessment: Report given to RN and Post -op Vital signs reviewed and stable  Post vital signs: Reviewed and stable  Last Vitals:  Vitals Value Taken Time  BP 170/65 02/24/22 1542  Temp    Pulse 61 02/24/22 1546  Resp 25 02/24/22 1546  SpO2 100 % 02/24/22 1546  Vitals shown include unvalidated device data.  Last Pain:  Vitals:   02/24/22 1542  TempSrc: Temporal  PainSc:          Complications: No notable events documented.

## 2022-02-24 NOTE — Op Note (Signed)
[facility] Patient Name: [name] Procedure Date: 02/24/2022 MRN: [id number] Attending MD: Justice Britain , MD Date of Birth: 24-May-1936 CSN: [id number] Age: 86 Admit Type: Outpatient Procedure:                Upper EUS Indications:              Dilated pancreatic duct on CT scan, Pancreatic cyst                            on CT scan Providers:                Justice Britain, MD, Burtis Junes, RN, Luan Moore, Technician, Hebron Alday CRNA, CRNA Referring MD:             Edwinna Areola. [place] Crane MD, Maylon Peppers Medicines:                General Anesthesia, Cipro 660 mg IV Complications:            No immediate complications. Estimated Blood Loss:     Estimated blood loss was minimal. Procedure:                Pre-Anesthesia Assessment:                           - Prior to the procedure, a History and Physical                            was performed, and patient medications and                            allergies were reviewed. The patient's tolerance of                            previous anesthesia was also reviewed. The risks                            and benefits of the procedure and the sedation                            options and risks were discussed with the patient.                            All questions were answered, and informed consent                            was obtained. Prior Anticoagulants: The patient                            last took Ticagrelor 5 days prior to the procedure                            and has taken no previous anticoagulant or  antiplatelet agents except for aspirin. ASA Grade                            Assessment: III - A patient with severe systemic                            disease. After reviewing the risks and benefits,                            the patient was deemed in satisfactory condition to                            undergo the procedure.                            After obtaining informed consent, the endoscope was                            passed under direct vision. Throughout the                            procedure, the patient's blood pressure, pulse, and                            oxygen saturations were monitored continuously. The                            [id number]-H190 ([id number]) Olympus endoscope was introduced                            through the mouth, and advanced to the second part                            of duodenum. The [id number]-Q190V ([id number]) Olympus                            duodenoscope was introduced through the mouth, and                            advanced to the area of papilla. The GF-[id number]                            ([id number]) Olympus linear ultrasound scope was                            introduced through the mouth, and advanced to the                            duodenum for ultrasound examination from the                            stomach and duodenum. The upper EUS was  accomplished without difficulty. The patient                            tolerated the procedure. Scope In: Scope Out: Findings:      ENDOSCOPIC FINDING: :      A non-obstructing Schatzki ring was found at the gastroesophageal       junction. Biopsies were taken with a cold forceps for disruption       purposes.      No other gross lesions were noted in the entire esophagus. After       completion of the EUS, a guidewire was placed and the scope was       withdrawn. Dilation was performed with a Savary dilator with mild       resistance at 18 mm. The dilation site was examined following endoscope       reinsertion and showed mild mucosal disruption/wrent just below the UES,       mild improvement in luminal narrowing and no perforation.      A 2 cm hiatal hernia was present.      Multiple dispersed small erosions with no bleeding and no stigmata of       recent bleeding were found in the entire examined stomach.  Biopsies were       taken with a cold forceps for histology and Helicobacter pylori testing.      No other gross lesions were noted in the duodenal bulb, in the first       portion of the duodenum and in the second portion of the duodenum.      A fishmouth deformity was found at the major papilla.      ENDOSONOGRAPHIC FINDING: :      Anechoic lesions suggestive of three cysts were identified in the       pancreatic head (1) and genu of the pancreas (2). The first cyst       measured 19 mm by 10 mm in the HOP. The second cyst measured 7 mm by 6       mm in the NOP. The third cyst measured 9 mm by 7 mm in the NOP. There       was no associated masses. Diagnostic needle aspiration for fluid was       performed. Color Doppler imaging was utilized prior to needle puncture       to confirm a lack of significant vascular structures within the needle       path. One pass was made with the Expect 22 gauge needle using a       transduodenal approach to the HOP cyst. A stylet was used. The amount of       fluid collected was ~1 mL. The fluid was clear and white. Sample was       sent for amylase concentration, cytology and CEA.      Pancreatic parenchymal abnormalities were noted in the entire pancreas.       These consisted of lobularity without honeycombing, cysts and       hyperechoic strands.      The pancreatic duct had a prominently branched endosonographic       appearance and had a tortuous/ectatic appearance as well as some       prominence in the pancreatic head (2.7 -> 4.0 mm), genu of the pancreas       (3.0 mm), body of the pancreas (3.3 mm) and  tail of the pancreas (2.4       mm).      There was no sign of significant endosonographic abnormality in the       common bile duct (4.0 mm)and in the common hepatic duct (7.5 mm).      Endosonographic imaging of the ampulla showed no intramural       (subepithelial) lesion.      No malignant-appearing lymph nodes were visualized in the celiac  region       (level 20), peripancreatic region and porta hepatis region.      Endosonographic imaging in the visualized portion of the liver showed no       mass.      The celiac region was visualized.      I could not get the Linear EUS to pass into the short position from D2       and thus could not evaluate the Uncinate Process of the Pancreas. Impression:               EGD Impression:                           - Non-obstructing Schatzki ring. Disrupted.                           - No gross mucosal lesions in esophagus. Dilated                            with mucosal wrent just below UES.                           - 2 cm hiatal hernia.                           - Erosive gastropathy with no bleeding and no                            stigmata of recent bleeding. Biopsied.                           - No gross lesions in the duodenal bulb, in the                            first portion of the duodenum and in the second                            portion of the duodenum.                           - Ampullary Fish-mouth deformity.                           EUS Impression:                           - Three cystic lesions were seen in the pancreatic  head and genu of the pancreas. Cytology results are                            pending. However, the endosonographic appearance is                            suspicious for a mixed intraductal papillary                            mucinous neoplasm. Fine needle aspiration for fluid                            performed.                           - Pancreatic parenchymal abnormalities consisting                            of lobularity, cysts and hyperechoic strands were                            noted in the entire pancreas.                           - The pancreatic duct had a prominently branched                            endosonographic appearance and had a                            tortuous/ectatic appearance in  the pancreatic head,                            genu of the pancreas, body of the pancreas and tail                            of the pancreas.                           - I could not get the Linear EUS to pass into the                            short position from D2 and thus could not evaluate                            the Uncinate Process of the Pancreas.                           - There was no sign of significant pathology in the                            common bile duct and in the common hepatic duct.                           -  No malignant-appearing lymph nodes were                            visualized in the celiac region (level 20),                            peripancreatic region and porta hepatis region. Moderate Sedation:      Not Applicable - Patient had care per Anesthesia. Recommendation:           - The patient will be observed post-procedure,                            until all discharge criteria are met.                           - Discharge patient to home.                           - Patient has a contact number available for                            emergencies. The signs and symptoms of potential                            delayed complications were discussed with the                            patient. Return to normal activities tomorrow.                            Written discharge instructions were provided to the                            patient.                           - Please use Cepacol or Halls Lozenges +/-                            Chloraseptic spray for next 72-96 hours to aid in                            sore thoat should you experience this.                           - Dilation diet as per protocol.                           - Low fat diet.                           - Observe patient's clinical course.                           - Monitor for signs/symptoms of bleeding,  perforation, and infection. If issues  please call                            our number to get further assistance as needed.                           - Await cytology results and await path results.                           - Augmentin 875 mg BID x 3-days to decrease                            post-interventional infectious complications.                           - Consider repeat imaging in 1-year MRI/MRCP. Her                            overall clinical health state is such that I'm not                            sure she would ever be a candidate for surgical                            options if something were to develop, but could                            consider and monitor closely.                           - Discussion at [facility] can be had as well, pending the                            cytology/fluid analysis studies (if we get anything                            back as it was such a small amount of fluid).                           - The findings and recommendations were discussed                            with the patient.                           - The findings and recommendations were discussed                            with the designated responsible adult. Procedure Code(s):        --- Professional ---                           [phone], Esophagogastroduodenoscopy, flexible,  transoral; with transendoscopic ultrasound-guided                            intramural or transmural fine needle                            aspiration/biopsy(s), (includes endoscopic                            ultrasound examination limited to the esophagus,                            stomach or duodenum, and adjacent structures)                           [id number], Esophagogastroduodenoscopy, flexible,                            transoral; with insertion of guide wire followed by                            passage of dilator(s) through esophagus over guide                            wire Diagnosis Code(s):         --- Professional ---                           K22.2, Esophageal obstruction                           K44.9, Diaphragmatic hernia without obstruction or                            gangrene                           K31.89, Other diseases of stomach and duodenum                           K86.2, Cyst of pancreas                           K86.9, Disease of pancreas, unspecified                           I89.9, Noninfective disorder of lymphatic vessels                            and lymph nodes, unspecified                           K86.89, Other specified diseases of pancreas                           R93.3, Abnormal findings on diagnostic imaging of  other parts of digestive tract                           K83.8, Other specified diseases of biliary tract CPT copyright 2019 [employer]. All rights reserved. The codes documented in this report are preliminary and upon coder review may  be revised to meet current compliance requirements. Justice Britain, MD 02/24/2022 3:59:22 PM Number of Addenda: 0

## 2022-02-24 NOTE — Anesthesia Procedure Notes (Signed)
Procedure Name: Intubation Date/Time: 02/24/2022 2:41 PM  Performed by: Lind Covert, CRNAPre-anesthesia Checklist: Patient identified, Emergency Drugs available, Suction available, Patient being monitored and Timeout performed Patient Re-evaluated:Patient Re-evaluated prior to induction Oxygen Delivery Method: Circle system utilized Preoxygenation: Pre-oxygenation with 100% oxygen Induction Type: IV induction Ventilation: Mask ventilation without difficulty Laryngoscope Size: Mac and 3 Grade View: Grade I Tube type: Oral Tube size: 7.0 mm Number of attempts: 1 Airway Equipment and Method: Stylet Placement Confirmation: ETT inserted through vocal cords under direct vision, positive ETCO2 and breath sounds checked- equal and bilateral Secured at: 21 cm Tube secured with: Tape Dental Injury: Teeth and Oropharynx as per pre-operative assessment

## 2022-02-24 NOTE — Progress Notes (Signed)
Made dr. Ermalene Postin aware of bp 239/78 after '10mg'$  hydralazine. HR 61. Order for '5mg'$  labetolol. Will continue to assess.

## 2022-02-24 NOTE — Anesthesia Preprocedure Evaluation (Signed)
Anesthesia Evaluation  Patient identified by MRN, date of birth, ID band Patient awake    Reviewed: Allergy & Precautions, NPO status , Patient's Chart, lab work & pertinent test results  History of Anesthesia Complications Negative for: history of anesthetic complications  Airway Mallampati: II  TM Distance: >3 FB     Dental  (+) Upper Dentures, Edentulous Lower   Pulmonary COPD,  COPD inhaler and oxygen dependent, former smoker,    breath sounds clear to auscultation       Cardiovascular hypertension, Pt. on medications + CAD, + Past MI and + Cardiac Stents  + pacemaker  Rhythm:Regular  ? Successful Complext DES PCI of Ostial & mid to Distal RCA using Shockwave Lithotrypsy and ScoreFlex Scoring Balloon Angioplasty with Guideliner Extension Catheter. ? Mid-Distal RCA tandem lesions: Finally treated with distally overlapping SYNERGY XD (DES) 3.0X48 mm postdilated to 3.6 mm ? High-pressure score flex angioplasty followed by post dilation of previously placed ostial and proximal RCA stent dilating up to 3.5 mm-residual 10% ? Ostial RCA PCI with SYNERGY XD 3.5X12. ->  Deployed at 3.65 mm. --> All lesions segments reduced to 0% with exception of the in-stent restenosis portion which was 10%. --> TIMI-3 flow pre and post.   [employer] pacemaker AP ~40%, AP/VP ~50%   Neuro/Psych  Headaches, PSYCHIATRIC DISORDERS Anxiety  Neuromuscular disease    GI/Hepatic hiatal hernia, GERD  ,  Endo/Other  negative endocrine ROSLab Results      Component                Value               Date                      HGBA1C                   5.4                 03/20/2021             Renal/GU negative Renal ROSLab Results      Component                Value               Date                      CREATININE               0.70                10/18/2021                Musculoskeletal negative musculoskeletal ROS (+)   Abdominal   Peds   Hematology Lab Results      Component                Value               Date                      WBC                      7.7                 05/29/2021                HGB  12.5                05/29/2021                HCT                      39.6                05/29/2021                MCV                      93.4                05/29/2021                PLT                      212                 05/29/2021              Anesthesia Other Findings 1. Left ventricular ejection fraction, by estimation, is 55 to 60%. The  left ventricle has normal function. The left ventricle has no regional  wall motion abnormalities. Left ventricular diastolic parameters are  consistent with Grade II diastolic  dysfunction (pseudonormalization). Elevated left ventricular end-diastolic  pressure.  2. Right ventricular systolic function is normal. The right ventricular  size is normal. There is normal pulmonary artery systolic pressure.  3. Left atrial size was moderately dilated.  4. The mitral valve is degenerative. Mild mitral valve regurgitation. No  evidence of mitral stenosis.  5. There is partial fusion of the left and non-coronary cusps. The aortic  valve is calcified. There is mild calcification of the aortic valve. There  is mild thickening of the aortic valve. Aortic valve regurgitation is  mild. Mild aortic valve sclerosis  is present, with no evidence of aortic valve stenosis. Aortic  regurgitation PHT measures 582 msec. Aortic valve area, by VTI measures  2.49 cm. Aortic valve mean gradient measures 3.5 mmHg. Aortic valve Vmax  measures 1.23 m/s.  6. Aortic dilatation noted. There is mild dilatation of the aortic root,  measuring 37 mm. There is mild dilatation of the ascending aorta,  measuring 38 mm.    Reproductive/Obstetrics                             Anesthesia Physical Anesthesia Plan  ASA: 3  Anesthesia Plan: General    Post-op Pain Management:    Induction: Intravenous  PONV Risk Score and Plan: 3 and Ondansetron and Dexamethasone  Airway Management Planned: Oral ETT  Additional Equipment: None  Intra-op Plan:   Post-operative Plan: Extubation in OR  Informed Consent: I have reviewed the patients History and Physical, chart, labs and discussed the procedure including the risks, benefits and alternatives for the proposed anesthesia with the patient or authorized representative who has indicated his/her understanding and acceptance.     Dental advisory given  Plan Discussed with: CRNA  Anesthesia Plan Comments:         Anesthesia Quick Evaluation

## 2022-02-24 NOTE — Anesthesia Postprocedure Evaluation (Signed)
Anesthesia Post Note  Patient: [name]  Procedure(s) Performed: UPPER ENDOSCOPIC ULTRASOUND (EUS) LINEAR ESOPHAGOGASTRODUODENOSCOPY (EGD) BIOPSY FINE NEEDLE ASPIRATION SAVORY DILATION     Patient location during evaluation: Endoscopy Anesthesia Type: General Level of consciousness: awake and alert Pain management: pain level controlled Vital Signs Assessment: post-procedure vital signs reviewed and stable Respiratory status: spontaneous breathing, nonlabored ventilation and respiratory function stable Cardiovascular status: blood pressure returned to baseline and stable Postop Assessment: no apparent nausea or vomiting Anesthetic complications: no   No notable events documented.  Last Vitals:  Vitals:   02/24/22 1615 02/24/22 1620  BP: (!) 198/88 (!) 195/78  Pulse: 80 70  Resp: (!) 22 18  Temp:    SpO2: 95% 96%    Last Pain:  Vitals:   02/24/22 1542  TempSrc: Temporal  PainSc:                  Lidia Collum

## 2022-02-24 NOTE — H&P (Signed)
GASTROENTEROLOGY PROCEDURE H&P NOTE   Primary Care Physician: Celene Squibb, MD  HPI: [name] is a 86 y.o. female who presents for EGD/EUS to evaluate pancreatic cysts increasing in size (less than 3 cm) with new PD dilation previously followed by Dr. Laural Golden, patient also describes history of dysphagia and has required esophageal dilations previously.  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   CATARACT EXTRACTION     CHOLECYSTECTOMY     COLONOSCOPY  2011   COLONOSCOPY N/A 10/19/2014   Procedure: COLONOSCOPY;  Surgeon: Rogene Houston, MD;  [facility];  Service: Endoscopy;  Laterality: N/A;  1030   CORONARY STENT INTERVENTION N/A 05/28/2021   Procedure: CORONARY STENT INTERVENTION;  Surgeon: Leonie Man, MD;  Location: [facility];  Service: Cardiovascular;  Laterality: N/A;   ESOPHAGEAL DILATION N/A 05/02/2020   Procedure: ESOPHAGEAL DILATION;  Surgeon: Rogene Houston, MD;  [facility];  Service: Gastroenterology;  Laterality: N/A;   ESOPHAGOGASTRODUODENOSCOPY N/A 10/26/2014   Procedure: ESOPHAGOGASTRODUODENOSCOPY (EGD);  Surgeon: Rogene Houston, MD;  [facility];  Service: Endoscopy;   Laterality: N/A;  22 - Dr. has lunch and learn   ESOPHAGOGASTRODUODENOSCOPY (EGD) WITH PROPOFOL N/A 05/02/2020   Procedure: ESOPHAGOGASTRODUODENOSCOPY (EGD) WITH PROPOFOL;  Surgeon: Rogene Houston, MD;  [facility];  Service: Gastroenterology;  Laterality: N/A;  250   Esophagogastroduodenoscopy with esophageal dilation  2004, 2006, 2007   GIVENS CAPSULE STUDY N/A 10/31/2014   Procedure: GIVENS CAPSULE STUDY;  Surgeon: Rogene Houston, MD;  [facility];  Service: Endoscopy;  Laterality: N/A;  730 -- pacemaker--needs monitoring--outpatient bed   INSERT / REPLACE / REMOVE PACEMAKER  2010   INTRAVASCULAR ULTRASOUND/IVUS N/A 05/28/2021   Procedure: Intravascular Ultrasound/IVUS;  Surgeon: Leonie Man, MD;  Location: [facility];  Service: Cardiovascular;  Laterality: N/A;   LEFT HEART CATH AND CORONARY ANGIOGRAPHY N/A 03/21/2021   Procedure: LEFT HEART CATH AND CORONARY ANGIOGRAPHY;  Surgeon: Belva Crome, MD;  Location: [facility];  Service: Cardiovascular;  Laterality: N/A;   LEFT HEART CATHETERIZATION WITH CORONARY ANGIOGRAM N/A 11/17/2011   Procedure: LEFT HEART CATHETERIZATION WITH CORONARY ANGIOGRAM;  Surgeon: Sherren Mocha, MD;  Location: [facility];  Service: Cardiovascular;  Laterality: N/A;   LEFT HEART CATHETERIZATION WITH CORONARY ANGIOGRAM N/A 09/26/2014   Procedure: LEFT HEART CATHETERIZATION WITH CORONARY ANGIOGRAM;  Surgeon: Leonie Man, MD;  Location: [facility] CATH LAB;  Service: Cardiovascular;  Laterality: N/A;   Right rotator cuff repair  SHOULDER ACROMIOPLASTY Right 05/30/2015   Procedure: RIGHT SHOULDER ACROMIOPLASTY;  Surgeon: Carole Civil, MD;  Location: [facility];  Service: Orthopedics;  Laterality: Right;   SHOULDER OPEN ROTATOR CUFF REPAIR Right 05/30/2015   Procedure: OPEN ROTATOR CUFF REPAIR RIGHT SHOULDER;  Surgeon: Carole Civil, MD;  Location: [facility];  Service: Orthopedics;  Laterality: Right;   SHOULDER OPEN  ROTATOR CUFF REPAIR Left 10/22/2016   Procedure: ROTATOR CUFF REPAIR SHOULDER OPEN;  Surgeon: Carole Civil, MD;  Location: [facility];  Service: Orthopedics;  Laterality: Left;   Subxiphoid pericardial window  11/10   VAGINAL HYSTERECTOMY     Current Facility-Administered Medications  Medication Dose Route Frequency Provider Last Rate Last Admin   lactated ringers infusion   Intravenous Continuous Mansouraty, Telford Nab., MD 10 mL/hr at 02/24/22 1349 Continued from Pre-op at 02/24/22 1349    Current Facility-Administered Medications:    lactated ringers infusion, , Intravenous, Continuous, Mansouraty, Telford Nab., MD, Last Rate: 10 mL/hr at 02/24/22 1349, Continued from Pre-op at 02/24/22 1349 Allergies  Allergen Reactions   Amitriptyline Hcl Other (See Comments)    Caused "jaws to twist and lock"   Plavix [Clopidogrel Bisulfate] Hives   Sulfonamide Derivatives Other (See Comments)    UNKNOWN REACTION   Family History  Problem Relation Age of Onset   Cancer Mother        Colon    Coronary artery disease Sister    Coronary artery disease Brother    Arthritis Other    Lung disease Other    Asthma Other    Social History   Socioeconomic History   Marital status: Widowed    Spouse name: Not on file   Number of children: 1   Years of education: Not on file   Highest education level: 8th grade  Occupational History   Occupation: Retired    Fish farm manager: RETIRED  Tobacco Use   Smoking status: Former    Packs/day: 2.00    Years: 10.00    Total pack years: 20.00    Types: Cigarettes    Quit date: 05/23/1989    Years since quitting: 32.7    Passive exposure: Past   Smokeless tobacco: Never  Vaping Use   Vaping Use: Never used  Substance and Sexual Activity   Alcohol use: No    Alcohol/week: 0.0 standard drinks of alcohol   Drug use: No   Sexual activity: Never  Other Topics Concern   Not on file  Social History Narrative   She lives alone, has adopted daughter.    Social Determinants of Health   Financial Resource Strain: Not on file  Food Insecurity: No Food Insecurity (03/25/2021)   Hunger Vital Sign    Worried About Running Out of Food in the Last Year: Never true    Ran Out of Food in the Last Year: Never true  Transportation Needs: No Transportation Needs (03/25/2021)   PRAPARE - Hydrologist (Medical): No    Lack of Transportation (Non-Medical): No  Physical Activity: Not on file  Stress: Not on file  Social Connections: Not on file  Intimate Partner Violence: Not on file    Physical Exam: Today's Vitals   02/24/22 1228 02/24/22 1304 02/24/22 1320  BP: (!) 253/95 (!) 239/78 (!) 209/81  Pulse:   70  Resp: (!) 25  (!) 23  Temp: 97.6 F (36.4 C)    TempSrc: Oral    SpO2: 100%  100%  Weight: 59 kg  Height: '5\' 5"'$  (1.651 m)    PainSc: 0-No pain     Body mass index is 21.63 kg/m. GEN: NAD EYE: Sclerae anicteric ENT: MMM CV: Non-tachycardic GI: Soft, NT/ND NEURO:  Alert & Oriented x 3  Lab Results: No results for input(s): "WBC", "HGB", "HCT", "PLT" in the last 72 hours. BMET No results for input(s): "NA", "K", "CL", "CO2", "GLUCOSE", "BUN", "CREATININE", "CALCIUM" in the last 72 hours. LFT No results for input(s): "PROT", "ALBUMIN", "AST", "ALT", "ALKPHOS", "BILITOT", "BILIDIR", "IBILI" in the last 72 hours. PT/INR No results for input(s): "LABPROT", "INR" in the last 72 hours.   Impression / Plan: This is a 86 y.o.female who presents for EGD/EUS to evaluate pancreatic cysts increasing in size (less than 3 cm) with new PD dilation previously followed by Dr. Laural Golden, patient also describes history of dysphagia and has required esophageal dilations previously.  The risks of an EUS including intestinal perforation, bleeding, infection, aspiration, and medication effects were discussed as was the possibility it may not give a definitive diagnosis if a biopsy is performed.  When a biopsy of the  pancreas is done as part of the EUS, there is an additional risk of pancreatitis at the rate of about 1-2%.  It was explained that procedure related pancreatitis is typically mild, although it can be severe and even life threatening, which is why we do not perform random pancreatic biopsies and only biopsy a lesion/area we feel is concerning enough to warrant the risk.  The risks and benefits of endoscopic evaluation/treatment were discussed with the patient and/or family; these include but are not limited to the risk of perforation, infection, bleeding, missed lesions, lack of diagnosis, severe illness requiring hospitalization, as well as anesthesia and sedation related illnesses.  The patient's history has been reviewed, patient examined, no change in status, and deemed stable for procedure.  The patient and/or family is agreeable to proceed.    Justice Britain, MD [facility] Office # [phone]

## 2022-02-25 ENCOUNTER — Encounter: Payer: Self-pay | Admitting: Gastroenterology

## 2022-02-25 LAB — SURGICAL PATHOLOGY

## 2022-02-26 ENCOUNTER — Encounter (HOSPITAL_COMMUNITY): Payer: Self-pay | Admitting: Gastroenterology

## 2022-02-26 DIAGNOSIS — I2510 Atherosclerotic heart disease of native coronary artery without angina pectoris: Secondary | ICD-10-CM | POA: Diagnosis not present

## 2022-02-26 DIAGNOSIS — R0602 Shortness of breath: Secondary | ICD-10-CM | POA: Diagnosis not present

## 2022-02-28 NOTE — Progress Notes (Signed)
Remote pacemaker transmission.   

## 2022-03-11 ENCOUNTER — Encounter: Payer: Self-pay | Admitting: Gastroenterology

## 2022-03-11 NOTE — Progress Notes (Signed)
Tried to reach the pt and line rings then goes busy

## 2022-03-11 NOTE — Progress Notes (Signed)
Tried again to reach the pt and line rings then goes busy.

## 2022-03-11 NOTE — Progress Notes (Unsigned)
[facility] laboratories  Pancreatic head cyst fluid analysis CEA 22 ng/mL Amylase 21,000 144 units/L  These laboratories are most consistent with a pancreatic pseudocyst. However her overall endoscopic appearance with a significant fishmouth deformity is most consistent with IPMN.  With these discordant findings I would like to present her case at [facility] At her age, even if these are IPMN's, I am not sure that she would be a candidate for surgical intervention or evaluation. Thus I am thinking that I will probably still keep MRI/MRCP for a 1 year follow-up but we will update her after an upcoming [place] discussion in the next few weeks.  Patty, Please reach out to the patient and let her know that we will update her Thanks. GM

## 2022-03-12 NOTE — Progress Notes (Signed)
Multiple attempts made to reach the pt will mail letter to have her return call.

## 2022-03-12 NOTE — Progress Notes (Signed)
Line rings then goes busy 

## 2022-03-17 ENCOUNTER — Other Ambulatory Visit: Payer: Self-pay | Admitting: Cardiology

## 2022-03-20 ENCOUNTER — Telehealth (INDEPENDENT_AMBULATORY_CARE_PROVIDER_SITE_OTHER): Payer: Self-pay | Admitting: Gastroenterology

## 2022-03-20 NOTE — Telephone Encounter (Signed)
Patient states understanding that she will be placed on a cancellation list and will keep the 06/09/2022 appt if nothing before then.She will call us if she has worsening issues between then.

## 2022-03-20 NOTE — Telephone Encounter (Signed)
Patient presented to the office - wanted to make an appointment - she has one in November - was told she would be put on the cancellation list - not sure if she understood - she's hard of hearing - please call her at [phone]

## 2022-03-29 DIAGNOSIS — R0602 Shortness of breath: Secondary | ICD-10-CM | POA: Diagnosis not present

## 2022-03-29 DIAGNOSIS — I2510 Atherosclerotic heart disease of native coronary artery without angina pectoris: Secondary | ICD-10-CM | POA: Diagnosis not present

## 2022-04-09 ENCOUNTER — Other Ambulatory Visit: Payer: Self-pay

## 2022-04-09 ENCOUNTER — Encounter: Payer: Self-pay | Admitting: Gastroenterology

## 2022-04-09 DIAGNOSIS — K869 Disease of pancreas, unspecified: Secondary | ICD-10-CM

## 2022-04-09 NOTE — Progress Notes (Signed)
Thanks so much for the update [facility]

## 2022-04-09 NOTE — Progress Notes (Signed)
The pt has been advised to go the lab of her choice for CA-19-9.  She will go to [facility]. The order has been entered.  I have also sent a message to myself to call pt in 6 months.

## 2022-04-09 NOTE — Progress Notes (Signed)
The proposed treatment discussed in conference is for discussion purpose only and is not a binding recommendation.  The patients have not been physically examined, or presented with their treatment options.  Therefore, final treatment plans cannot be decided.  

## 2022-04-09 NOTE — Progress Notes (Signed)
Patient's case was discussed at multidisciplinary conference today.  The imaging was reviewed.  Her EUS was reviewed as well as the aspiration laboratories.  Although overall clinical picture does suggest potential mixed branch duct/main duct IPMN with slight PD dilation (5 mm or less) as well as the smaller cysts, there were no other high risk features noted.  Based on the patient's medical comorbidities it is not clear that a surgical resection would be in her best interest but continued monitoring is reasonable for a period in time.  If she were to have other medical comorbidities progress then we would potentially stop surveillance of these lesions.  The plan will be a 24-monthpancreas protocol CT abdomen to be performed.  Pending these results discussion about pushing this out further is reasonable.  A baseline CA 19-9 should be obtained as well as per discussion with [place] we will work on having her have that performed at her availability.  I will have my team reach out to the patient to work on getting this scheduled.  I will also forward this to the patient's new primary gastroenterologist and PCP.   GJustice Britain MD [facility] # [phone]

## 2022-04-14 ENCOUNTER — Other Ambulatory Visit: Payer: Self-pay | Admitting: Cardiology

## 2022-04-16 DIAGNOSIS — I10 Essential (primary) hypertension: Secondary | ICD-10-CM | POA: Diagnosis not present

## 2022-04-16 DIAGNOSIS — R7301 Impaired fasting glucose: Secondary | ICD-10-CM | POA: Diagnosis not present

## 2022-04-23 ENCOUNTER — Other Ambulatory Visit (INDEPENDENT_AMBULATORY_CARE_PROVIDER_SITE_OTHER): Payer: Self-pay

## 2022-04-23 DIAGNOSIS — R110 Nausea: Secondary | ICD-10-CM | POA: Diagnosis not present

## 2022-04-23 DIAGNOSIS — I10 Essential (primary) hypertension: Secondary | ICD-10-CM | POA: Diagnosis not present

## 2022-04-23 DIAGNOSIS — Z0000 Encounter for general adult medical examination without abnormal findings: Secondary | ICD-10-CM | POA: Diagnosis not present

## 2022-04-23 DIAGNOSIS — K869 Disease of pancreas, unspecified: Secondary | ICD-10-CM

## 2022-04-23 DIAGNOSIS — R7301 Impaired fasting glucose: Secondary | ICD-10-CM | POA: Diagnosis not present

## 2022-04-23 DIAGNOSIS — I480 Paroxysmal atrial fibrillation: Secondary | ICD-10-CM | POA: Diagnosis not present

## 2022-04-23 DIAGNOSIS — K8689 Other specified diseases of pancreas: Secondary | ICD-10-CM | POA: Diagnosis not present

## 2022-04-23 DIAGNOSIS — R1310 Dysphagia, unspecified: Secondary | ICD-10-CM | POA: Diagnosis not present

## 2022-04-23 DIAGNOSIS — R809 Proteinuria, unspecified: Secondary | ICD-10-CM | POA: Diagnosis not present

## 2022-04-23 DIAGNOSIS — E782 Mixed hyperlipidemia: Secondary | ICD-10-CM | POA: Diagnosis not present

## 2022-04-23 DIAGNOSIS — G250 Essential tremor: Secondary | ICD-10-CM | POA: Diagnosis not present

## 2022-04-23 DIAGNOSIS — H9193 Unspecified hearing loss, bilateral: Secondary | ICD-10-CM | POA: Diagnosis not present

## 2022-04-23 DIAGNOSIS — I2510 Atherosclerotic heart disease of native coronary artery without angina pectoris: Secondary | ICD-10-CM | POA: Insufficient documentation

## 2022-04-24 ENCOUNTER — Other Ambulatory Visit: Payer: Self-pay | Admitting: Cardiology

## 2022-04-29 DIAGNOSIS — R0602 Shortness of breath: Secondary | ICD-10-CM | POA: Diagnosis not present

## 2022-04-29 DIAGNOSIS — I2510 Atherosclerotic heart disease of native coronary artery without angina pectoris: Secondary | ICD-10-CM | POA: Diagnosis not present

## 2022-05-01 ENCOUNTER — Ambulatory Visit (HOSPITAL_COMMUNITY)
Admission: RE | Admit: 2022-05-01 | Discharge: 2022-05-01 | Disposition: A | Discharge status: Home / Self Care | Payer: Medicare Other | Source: Ambulatory Visit | Attending: Gastroenterology | Admitting: Gastroenterology

## 2022-05-01 DIAGNOSIS — K753 Granulomatous hepatitis, not elsewhere classified: Secondary | ICD-10-CM | POA: Diagnosis not present

## 2022-05-01 DIAGNOSIS — K869 Disease of pancreas, unspecified: Secondary | ICD-10-CM | POA: Diagnosis not present

## 2022-05-01 DIAGNOSIS — M810 Age-related osteoporosis without current pathological fracture: Secondary | ICD-10-CM | POA: Diagnosis not present

## 2022-05-01 DIAGNOSIS — D7389 Other diseases of spleen: Secondary | ICD-10-CM | POA: Diagnosis not present

## 2022-05-01 DIAGNOSIS — K862 Cyst of pancreas: Secondary | ICD-10-CM | POA: Diagnosis not present

## 2022-05-01 LAB — POCT I-STAT CREATININE: Creatinine, Ser: 0.9 mg/dL (ref 0.44–1.00)

## 2022-05-01 MED ORDER — IOHEXOL 300 MG/ML  SOLN
100.0000 mL | Freq: Once | INTRAMUSCULAR | Status: AC | PRN
  Administered 2022-05-01: 80 mL via INTRAVENOUS

## 2022-05-15 ENCOUNTER — Ambulatory Visit (INDEPENDENT_AMBULATORY_CARE_PROVIDER_SITE_OTHER): Payer: Medicare Other

## 2022-05-15 DIAGNOSIS — K8689 Other specified diseases of pancreas: Secondary | ICD-10-CM | POA: Diagnosis not present

## 2022-05-15 DIAGNOSIS — I495 Sick sinus syndrome: Secondary | ICD-10-CM

## 2022-05-15 DIAGNOSIS — I10 Essential (primary) hypertension: Secondary | ICD-10-CM | POA: Diagnosis not present

## 2022-05-15 DIAGNOSIS — Z950 Presence of cardiac pacemaker: Secondary | ICD-10-CM | POA: Diagnosis not present

## 2022-05-15 DIAGNOSIS — I480 Paroxysmal atrial fibrillation: Secondary | ICD-10-CM | POA: Diagnosis not present

## 2022-05-16 LAB — CUP PACEART REMOTE DEVICE CHECK
Battery Impedance: 5749 Ohm
Battery Remaining Longevity: 2 mo
Battery Voltage: 2.63 V
Brady Statistic AP VP Percent: 50 %
Brady Statistic AP VS Percent: 43 %
Brady Statistic AS VP Percent: 0 %
Brady Statistic AS VS Percent: 7 %
Date Time Interrogation Session: 20231013105013
Implantable Lead Implant Date: 20100526
Implantable Lead Implant Date: 20100526
Implantable Lead Location: 753859
Implantable Lead Location: 753860
Implantable Lead Model: 5076
Implantable Lead Model: 5076
Implantable Pulse Generator Implant Date: 20100526
Lead Channel Impedance Value: 466 Ohm
Lead Channel Impedance Value: 528 Ohm
Lead Channel Pacing Threshold Amplitude: 0.625 V
Lead Channel Pacing Threshold Amplitude: 1.125 V
Lead Channel Pacing Threshold Pulse Width: 0.4 ms
Lead Channel Pacing Threshold Pulse Width: 0.4 ms
Lead Channel Setting Pacing Amplitude: 2 V
Lead Channel Setting Pacing Amplitude: 2.5 V
Lead Channel Setting Pacing Pulse Width: 0.4 ms
Lead Channel Setting Sensing Sensitivity: 4 mV

## 2022-05-22 NOTE — Progress Notes (Signed)
Remote pacemaker transmission.   

## 2022-05-29 DIAGNOSIS — R0602 Shortness of breath: Secondary | ICD-10-CM | POA: Diagnosis not present

## 2022-05-29 DIAGNOSIS — I2510 Atherosclerotic heart disease of native coronary artery without angina pectoris: Secondary | ICD-10-CM | POA: Diagnosis not present

## 2022-06-05 ENCOUNTER — Ambulatory Visit (INDEPENDENT_AMBULATORY_CARE_PROVIDER_SITE_OTHER): Payer: Medicare Other | Admitting: Gastroenterology

## 2022-06-06 ENCOUNTER — Other Ambulatory Visit (INDEPENDENT_AMBULATORY_CARE_PROVIDER_SITE_OTHER): Payer: Self-pay | Admitting: Internal Medicine

## 2022-06-06 ENCOUNTER — Other Ambulatory Visit: Payer: Self-pay | Admitting: Cardiology

## 2022-06-09 ENCOUNTER — Encounter (INDEPENDENT_AMBULATORY_CARE_PROVIDER_SITE_OTHER): Payer: Self-pay | Admitting: Gastroenterology

## 2022-06-09 ENCOUNTER — Ambulatory Visit (INDEPENDENT_AMBULATORY_CARE_PROVIDER_SITE_OTHER): Payer: Medicare Other | Admitting: Gastroenterology

## 2022-06-09 VITALS — BP 146/77 | HR 76 | Temp 97.5°F | Ht 65.5 in | Wt 142.0 lb

## 2022-06-09 DIAGNOSIS — R1030 Lower abdominal pain, unspecified: Secondary | ICD-10-CM

## 2022-06-09 DIAGNOSIS — D490 Neoplasm of unspecified behavior of digestive system: Secondary | ICD-10-CM

## 2022-06-09 DIAGNOSIS — K5900 Constipation, unspecified: Secondary | ICD-10-CM | POA: Diagnosis not present

## 2022-06-09 DIAGNOSIS — K869 Disease of pancreas, unspecified: Secondary | ICD-10-CM

## 2022-06-09 DIAGNOSIS — K219 Gastro-esophageal reflux disease without esophagitis: Secondary | ICD-10-CM | POA: Diagnosis not present

## 2022-06-09 NOTE — Patient Instructions (Signed)
Start taking Miralax 1 capful every day for one week. If bowel movements do not improve, increase to 2 capfuls every day. If after two weeks there is no improvement, increase to 2 capfuls in AM and one at night. Continue Sennokot Continue dicyclomine 10 mg BID Continue with Zofran as needed for nausea Repeat CT pancreas protocol in 10/2022

## 2022-06-09 NOTE — Progress Notes (Addendum)
[name], M.D. Gastroenterology & Hepatology [facility] [address] [place], [place] [address]  Primary Care Physician: Celene Squibb, MD [address] [id number]  I will communicate my assessment and recommendations to the referring MD via EMR.  Problems: Mixed branch duct/main duct IPMN Chronic recurrent abdominal pain  History of Present Illness: [name] is a 86 y.o. female with past medical history of COPD, coronary artery disease status post stent placement, [name] syndrome, hypertension, hyperlipidemia, mixed type IPMN, atrial fibrillation, anxiety, who presents for follow up of IPMN and abdominal pain.  Patient reports that a longstanding history of pain in her abdomen and nausea. She reports that her dicyclomine and Zofran have worked with the nausea and abdominal pain. States this has controlled her symptoms controlled , as they take the pain and nausea effectively.  Her companion states that she has noticed her pain may have aggravated recently as she was having some more constipation than usual. She has a bowel movement every 1-2 days. She has pain if she has not moved her bowels. She is taking Senna-docusate and occasionally Dulcolax.  The patient was last seen on 12/03/2021 (Dr. Laural Golden). At that time, the patient was advised to continue pantoprazole 40 mg every day for GERD.  Given his history of upper abdominal pain she was advised to take dicyclomine 10 mg daily and a second dose as needed if abdominal pain recurred.Marland Kitchen  She was found to have a growing pancreatic cystic lesion with associated abdominal pain.  Due to these she was referred for endoscopic ultrasound.  Underwent endoscopic ultrasound with Dr. Rush Landmark on 02/24/2022 with fine described below: A non-obstructing Schatzki ring was found at the gastroesophageal junction. Biopsies were taken with a cold forceps for disruption purposes. No other gross  lesions were noted in the entire esophagus. After completion of the EUS, a guidewire was placed and the scope was withdrawn. Dilation was performed with a Savary dilator with mild resistance at 18 mm. The dilation site was examined following endoscope reinsertion and showed mild mucosal disruption/wrent just below the UES, mild improvement in luminal narrowing and no perforation. A 2 cm hiatal hernia was present. Multiple dispersed small erosions with no bleeding and no stigmata of recent bleeding were found in the entire examined stomach. Biopsies were taken with a cold forceps for histology and Helicobacter pylori testing. No other gross lesions were noted in the duodenal bulb, in the first portion of the duodenum and in the second portion of the duodenum. A fishmouth deformity was found at the major papilla.  Path A. STOMACH, BIOPSY:  Mild chronic gastritis with reactive epithelial changes and focal  intestinal metaplasia  Negative for H. pylori, dysplasia and carcinoma   B. SCHATZKI'S RING, BIOPSY:  Reactive squamous mucosa  Chronic gastritis  Negative for intestinal metaplasia, dysplasia and carcinoma   EUS part: Anechoic lesions suggestive of three cysts were identified in the pancreatic head (1) and genu of the pancreas (2). The first cyst measured 19 mm by 10 mm in the HOP. The second cyst measured 7 mm by 6 mm in the NOP. The third cyst measured 9 mm by 7 mm in the NOP. There was no associated masses. Diagnostic needle aspiration for fluid was performed. Color Doppler imaging was utilized prior to needle puncture to confirm a lack of significant vascular structures within the needle path. One pass was made with the Expect 22 gauge needle using a transduodenal approach  to the HOP cyst. A stylet was used. The amount of fluid collected was ~1 mL. The fluid was clear and white. Sample was sent for amylase concentration, cytology and CEA. Pancreatic parenchymal abnormalities were noted in  the entire pancreas. These consisted of lobularity without honeycombing, cysts and hyperechoic strands. The pancreatic duct had a prominently branched endosonographic appearance and had a tortuous/ectatic appearance as well as some prominence in the pancreatic head (2.7 -> 4.0 mm), genu of the pancreas (3.0 mm), body of the pancreas (3.3 mm) and tail of the pancreas (2.4 mm).  There was no sign of significant endosonographic abnormality in the common bile duct (4.0 mm)and in the common hepatic duct (7.5 mm). Endosonographic imaging of the ampulla showed no intramural (subepithelial) lesion. No malignant-appearing lymph nodes were visualized in the celiac region (level 20), peripancreatic region and porta hepatis region. Endosonographic imaging in the visualized portion of the liver showed no mass.  NOTE: could not get the Linear EUS to pass into the short position from D2 and thus could not evaluate the Uncinate Process of the Pancreas.  CEA 22 , amylase 21,144, cytology showed acellular specimen  Her case was discussed at multidisciplinary tumor board.  It was considered that she was presenting changes concerning for a mixed branch duct/main duct IPMN.  It was considered that given her medical comorbidities she would not be the best surgical candidate but she will continue monitoring.  It was decided that she should have a repeat CT pancreas protocol in 6 months.  Depending on the findings on imaging, may need to discuss again the benefits versus risk of undergoing surgical resection.  Also, she was advised to obtain a baseline CA 19-9 but she has not performed this yet.  Notably, the patient underwent a CT of the abdomen with and without IV contrast on 05/01/2022 which showed decrease size of the cystic lesions of the pancreatic head and uncinate process measuring up to 15 mm.  She was advised to have a repeat CT pancreas protocol in 6 months.  The patient denies having any vomiting, fever, chills,  hematochezia, melena, hematemesis, abdominal distention,  diarrhea, jaundice, pruritus or weight loss.  Last EGD: as above  Past Medical History: 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[place])    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    Essential hypertension    GERD (gastroesophageal reflux disease)    H/O hiatal hernia    Headache(784.0)    HOH (hard of hearing)    Hyperlipidemia    Neuromuscular disorder (HCC)    Tremors   Pericardial effusion    Hemorrhagic    Presence of permanent cardiac pacemaker    Tachycardia-bradycardia syndrome ([place])    s/p Medtronic Adapta L dual chamber device  5/10    Past Surgical History: Past Surgical History:  Procedure Laterality Date   APPENDECTOMY     BIOPSY  02/24/2022   Procedure: BIOPSY;  Surgeon: Irving Copas., MD;  Location: [facility];  Service: Gastroenterology;;   CARDIAC CATHETERIZATION  2010   stents x2.   CATARACT EXTRACTION     CHOLECYSTECTOMY     COLONOSCOPY  2011   COLONOSCOPY N/A 10/19/2014   Procedure: COLONOSCOPY;  Surgeon: Rogene Houston,  MD;  [facility]: [facility];  Service: Endoscopy;  Laterality: N/A;  1030   CORONARY STENT INTERVENTION N/A 05/28/2021   Procedure: CORONARY STENT INTERVENTION;  Surgeon: Leonie Man, MD;  Location: [facility];  Service: Cardiovascular;  Laterality: N/A;   ESOPHAGEAL DILATION N/A 05/02/2020   Procedure: ESOPHAGEAL DILATION;  Surgeon: Rogene Houston, MD;  [facility];  Service: Gastroenterology;  Laterality: N/A;   ESOPHAGOGASTRODUODENOSCOPY N/A 10/26/2014   Procedure: ESOPHAGOGASTRODUODENOSCOPY (EGD);  Surgeon: Rogene Houston, MD;  [facility];  Service: Endoscopy;  Laterality: N/A;  86 - Dr. has lunch and learn    ESOPHAGOGASTRODUODENOSCOPY N/A 02/24/2022   Procedure: ESOPHAGOGASTRODUODENOSCOPY (EGD);  Surgeon: Irving Copas., MD;  Location: [facility];  Service: Gastroenterology;  Laterality: N/A;   ESOPHAGOGASTRODUODENOSCOPY (EGD) WITH PROPOFOL N/A 05/02/2020   Procedure: ESOPHAGOGASTRODUODENOSCOPY (EGD) WITH PROPOFOL;  Surgeon: Rogene Houston, MD;  [facility];  Service: Gastroenterology;  Laterality: N/A;  250   Esophagogastroduodenoscopy with esophageal dilation  2004, 2006, 2007   EUS N/A 02/24/2022   Procedure: UPPER ENDOSCOPIC ULTRASOUND (EUS) LINEAR;  Surgeon: Irving Copas., MD;  Location: [facility];  Service: Gastroenterology;  Laterality: N/A;   FINE NEEDLE ASPIRATION  02/24/2022   Procedure: FINE NEEDLE ASPIRATION;  Surgeon: Rush Landmark Telford Nab., MD;  Location: [facility];  Service: Gastroenterology;;  red path sent   GIVENS CAPSULE STUDY N/A 10/31/2014   Procedure: GIVENS CAPSULE STUDY;  Surgeon: Rogene Houston, MD;  [facility];  Service: Endoscopy;  Laterality: N/A;  730 -- pacemaker--needs monitoring--outpatient bed   INSERT / REPLACE / REMOVE PACEMAKER  2010   INTRAVASCULAR ULTRASOUND/IVUS N/A 05/28/2021   Procedure: Intravascular Ultrasound/IVUS;  Surgeon: Leonie Man, MD;  Location: [facility];  Service: Cardiovascular;  Laterality: N/A;   LEFT HEART CATH AND CORONARY ANGIOGRAPHY N/A 03/21/2021   Procedure: LEFT HEART CATH AND CORONARY ANGIOGRAPHY;  Surgeon: Belva Crome, MD;  Location: [facility];  Service: Cardiovascular;  Laterality: N/A;   LEFT HEART CATHETERIZATION WITH CORONARY ANGIOGRAM N/A 11/17/2011   Procedure: LEFT HEART CATHETERIZATION WITH CORONARY ANGIOGRAM;  Surgeon: Sherren Mocha, MD;  Location: [facility];  Service: Cardiovascular;  Laterality: N/A;   LEFT HEART CATHETERIZATION WITH CORONARY ANGIOGRAM N/A 09/26/2014   Procedure: LEFT HEART CATHETERIZATION WITH CORONARY ANGIOGRAM;  Surgeon: Leonie Man, MD;  Location: [facility];  Service: Cardiovascular;  Laterality: N/A;   Right rotator cuff repair     SAVORY DILATION N/A 02/24/2022   Procedure: SAVORY DILATION;  Surgeon: Rush Landmark Telford Nab., MD;  Location: [facility];  Service: Gastroenterology;  Laterality: N/A;   SHOULDER ACROMIOPLASTY Right 05/30/2015   Procedure: RIGHT SHOULDER ACROMIOPLASTY;  Surgeon: Carole Civil, MD;  Location: [facility];  Service: Orthopedics;  Laterality: Right;   SHOULDER OPEN ROTATOR CUFF REPAIR Right 05/30/2015   Procedure: OPEN ROTATOR CUFF REPAIR RIGHT SHOULDER;  Surgeon: Carole Civil, MD;  Location: [facility];  Service: Orthopedics;  Laterality: Right;   SHOULDER OPEN ROTATOR CUFF REPAIR Left 10/22/2016   Procedure: ROTATOR CUFF REPAIR SHOULDER OPEN;  Surgeon: Carole Civil, MD;  Location: [facility];  Service: Orthopedics;  Laterality: Left;   Subxiphoid pericardial window  11/10   VAGINAL HYSTERECTOMY      Family History: Family History  Problem Relation Age of Onset   Cancer Mother        Colon    Coronary artery disease Sister  Coronary artery disease Brother    Arthritis Other    Lung disease Other    Asthma Other     Social History: Social History   Tobacco Use  Smoking Status Former   Packs/day: 2.00   Years: 10.00   Total pack years: 20.00   Types: Cigarettes   Quit date: 05/23/1989   Years since quitting: 33.0   Passive exposure: Past  Smokeless Tobacco Never   Social History   Substance and Sexual Activity  Alcohol Use No   Alcohol/week: 0.0 standard drinks of alcohol   Social History   Substance and Sexual Activity  Drug Use No    Allergies: Allergies  Allergen Reactions   Amitriptyline Hcl Other (See Comments)    Caused "jaws to twist and lock"   Plavix [Clopidogrel Bisulfate] Hives   Sulfonamide Derivatives Other (See Comments)    UNKNOWN REACTION    Medications: Current Outpatient Medications  Medication Sig Dispense Refill    ALPRAZolam (XANAX) 1 MG tablet Take 1 mg by mouth 3 (three) times daily as needed for anxiety.     amiodarone (PACERONE) 200 MG tablet TAKE 1 TABLET ONCE DAILY ON MONDAY THROUGH SATURDAY, NONE ON SUNDAY. 72 tablet 3   amLODipine (NORVASC) 10 MG tablet TAKE (1) TABLET BY MOUTH ONCE DAILY. 30 tablet 5   aspirin EC 81 MG tablet Take 1 tablet (81 mg total) by mouth daily. Swallow whole. 90 tablet 3   Cyanocobalamin (VITAMIN B-12) 5000 MCG SUBL Take 5,000 mcg by mouth daily.     dicyclomine (BENTYL) 10 MG capsule TAKE (1) CAPSULE BY MOUTH TWICE DAILY. 60 capsule 0   folic acid (FOLVITE) 751 MCG tablet Take 400 mcg by mouth daily.     furosemide (LASIX) 40 MG tablet Take 1 tablet (40 mg total) by mouth daily as needed for fluid or edema. 30 tablet 0   isosorbide mononitrate (IMDUR) 30 MG 24 hr tablet Take 2 tablets (60 mg total) by mouth daily. 180 tablet 3   JARDIANCE 10 MG TABS tablet Take 10 mg by mouth daily.     metoprolol succinate (TOPROL-XL) 25 MG 24 hr tablet TAKE 3 TABLETS BY MOUTH IN THE MORNING AND 2 TABLETS IN THE EVENING. 150 tablet 3   Multiple Vitamin (MULTIVITAMIN WITH MINERALS) TABS tablet Take 1 tablet by mouth daily.      nitroGLYCERIN (NITROSTAT) 0.4 MG SL tablet Place 1 tablet (0.4 mg total) under the tongue every 5 (five) minutes x 3 doses as needed for chest pain. 30 tablet 12   ondansetron (ZOFRAN) 4 MG tablet Take 4 mg by mouth every 6 (six) hours as needed for nausea or vomiting.     OXYGEN Inhale 2 L into the lungs as needed (PRN as needed at night).     pantoprazole (PROTONIX) 40 MG tablet TAKE (1) TABLET BY MOUTH ONCE DAILY. 90 tablet 0   potassium chloride SA (K-DUR,KLOR-CON) 20 MEQ tablet Take 20 mEq by mouth See admin instructions. Takes 1 tablet (20 meq) by mouth daily (scheduled) & may take a second dose if she has a second dose of Lasix prn per patient     primidone (MYSOLINE) 50 MG tablet Take 50 mg by mouth at bedtime.     PROAIR HFA 108 (90 Base) MCG/ACT inhaler  Inhale 1 puff into the lungs every 4 (four) hours as needed for wheezing or shortness of breath.      ranolazine (RANEXA) 500 MG 12 hr tablet TAKE (1) TABLET BY MOUTH  TWICE DAILY. 60 tablet 8   rosuvastatin (CRESTOR) 40 MG tablet Take 1 tablet (40 mg total) by mouth daily. (Patient taking differently: Take 40 mg by mouth daily. Takes when she feels like it.) 90 tablet 3   senna-docusate (SENOKOT-S) 8.6-50 MG tablet Take 1 tablet by mouth at bedtime. (Patient taking differently: Take 1-2 tablets by mouth daily as needed (constipation.).) 30 tablet 0   ticagrelor (BRILINTA) 90 MG TABS tablet Take 1 tablet (90 mg total) by mouth 2 (two) times daily. 60 tablet 11   VITAMIN D, CHOLECALCIFEROL, PO Take 1 tablet by mouth daily. (Patient not taking: Reported on 06/09/2022)     vitamin E 180 MG (400 UNITS) capsule Take 400 Units by mouth daily. (Patient not taking: Reported on 06/09/2022)     No current facility-administered medications for this visit.    Review of Systems: GENERAL: negative for malaise, night sweats HEENT: No changes in hearing or vision, no nose bleeds or other nasal problems. NECK: Negative for lumps, goiter, pain and significant neck swelling RESPIRATORY: Negative for cough, wheezing CARDIOVASCULAR: Negative for chest pain, leg swelling, palpitations, orthopnea GI: SEE HPI MUSCULOSKELETAL: Negative for joint pain or swelling, back pain, and muscle pain. SKIN: Negative for lesions, rash PSYCH: Negative for sleep disturbance, mood disorder and recent psychosocial stressors. HEMATOLOGY Negative for prolonged bleeding, bruising easily, and swollen nodes. ENDOCRINE: Negative for cold or heat intolerance, polyuria, polydipsia and goiter. NEURO: negative for tremor, gait imbalance, syncope and seizures. The remainder of the review of systems is noncontributory.   Physical Exam: BP (!) 146/77 (BP Location: Left Arm, Patient Position: Sitting, Cuff Size: Large)   Pulse 76   Temp (!)  97.5 F (36.4 C) (Temporal)   Ht 5' 5.5" (1.664 m)   Wt 142 lb (64.4 kg)   BMI 23.27 kg/m  GENERAL: The patient is AO x3, in no acute distress. HEENT: Head is normocephalic and atraumatic. EOMI are intact. Mouth is well hydrated and without lesions. NECK: Supple. No masses LUNGS: Clear to auscultation. No presence of rhonchi/wheezing/rales. Adequate chest expansion HEART: RRR, normal s1 and s2. ABDOMEN: mildly tender in the lower abdomen, no guarding, no peritoneal signs, and nondistended. BS +. No masses. EXTREMITIES: Without any cyanosis, clubbing, rash, lesions or edema. NEUROLOGIC: AOx3, no focal motor deficit. SKIN: no jaundice, no rashes  Imaging/Labs: as above  I personally reviewed and interpreted the available labs, imaging and endoscopic files.  Impression and Plan: RACHANA MALESKY is a 86 y.o. female with past medical history of COPD, coronary artery disease status post stent placement, [name] syndrome, hypertension, hyperlipidemia, mixed type IPMN, atrial fibrillation, anxiety, who presents for follow up of IPMN and abdominal pain.  The patient has presented relatively stable symptoms as her abdominal pain has been present but has not worsened recently.  Both her nausea and abdominal pain have been controlled with her current regimen of Zofran and dicyclomine but she has presented worsening constipation, which may aggravate her abdominal pain occasionally.  I advised to add MiraLAX to her current laxative regimen to achieve more regular larger bowel movements which she is agreeable to try.  We had a very extensive discussion regarding her mixed type IPMN.  We discussed the possibility that this lesion may become a cancer in the future but also we discussed that she had multiple comorbidities and has advanced age which make her high risk surgical candidate for a high risk surgery.  We discussed the benefits and possible risk of proceeding with a  Whipple surgery if she were to have  worsening of her pancreatic lesions.  For now, we will continue the current recommendation for surveillance as discussed in the multidisciplinary tumor board.  She will have to have a CT pancreas in March 2024.  - Start taking Miralax 1 capful every day for one week. If bowel movements do not improve, increase to 2 capfuls every day. If after two weeks there is no improvement, increase to 2 capfuls in AM and one at night. - Continue Sennokot - Continue dicyclomine 10 mg BID - Continue with Zofran as needed for nausea - Repeat CT pancreas protocol in 10/2022  All questions were answered.      Katrina Peppers, MD Gastroenterology and Hepatology [facility]

## 2022-06-16 ENCOUNTER — Ambulatory Visit: Payer: Medicare Other | Attending: Cardiology | Admitting: Cardiology

## 2022-06-16 ENCOUNTER — Encounter: Payer: Self-pay | Admitting: Cardiology

## 2022-06-16 VITALS — BP 164/88 | HR 67 | Ht 65.0 in | Wt 144.0 lb

## 2022-06-16 DIAGNOSIS — I25119 Atherosclerotic heart disease of native coronary artery with unspecified angina pectoris: Secondary | ICD-10-CM | POA: Diagnosis not present

## 2022-06-16 DIAGNOSIS — I480 Paroxysmal atrial fibrillation: Secondary | ICD-10-CM

## 2022-06-16 DIAGNOSIS — I495 Sick sinus syndrome: Secondary | ICD-10-CM

## 2022-06-16 MED ORDER — TICAGRELOR 60 MG PO TABS: 60.0000 mg | ORAL_TABLET | Freq: Two times a day (BID) | ORAL | 6 refills | Status: DC

## 2022-06-16 NOTE — Progress Notes (Signed)
Cardiology Office Note  Date: 06/16/2022   ID: [name], DOB 03-04-1936, MRN [id number]  PCP:  Celene Squibb, MD  Cardiologist:  Rozann Lesches, MD Electrophysiologist:  [name], MD   Chief Complaint  Patient presents with   Cardiac follow-up    History of Present Illness: [name] is an 86 y.o. female last seen in May.  She is here today with caregiver for a routine visit.  Reports chronic fatigue, lack of energy.  Had an episode of chest discomfort over the weekend.  Otherwise remains on stable cardiac regimen as noted below.  I personally reviewed her ECG today which shows a ventricular paced rhythm.  We continue medical therapy for management of her cardiovascular disease.  Medtronic pacemaker in place with follow-up by Dr. Lovena Le.  Most recent device interrogation indicated normal function and nearing ERI.  She sees Dr. Lovena Le next month.  She underwent complex PCI of the RCA in October 2022 using Shockwave Lithotripsy and ScoreFlex angioplasty with DES x2 to the mid to distal RCA and angioplasty alone of the ostial/proximal RCA in-stent restenosis.  She had previously been felt to be a poor operative candidate on evaluation by Dr. Roxan Hockey.   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[place])    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    Essential hypertension    GERD (gastroesophageal reflux disease)    H/O hiatal hernia    Headache(784.0)    HOH (hard of hearing)    Hyperlipidemia    Neuromuscular disorder (HCC)    Tremors   Pericardial effusion    Hemorrhagic    Presence of permanent cardiac pacemaker    Tachycardia-bradycardia syndrome ([place])    s/p Medtronic Adapta L dual chamber device  5/10    Past Surgical History:  Procedure Laterality  Date   APPENDECTOMY     BIOPSY  02/24/2022   Procedure: BIOPSY;  Surgeon: Irving Copas., MD;  Location: [facility];  Service: Gastroenterology;;   CARDIAC CATHETERIZATION  2010   stents x2.   CATARACT EXTRACTION     CHOLECYSTECTOMY     COLONOSCOPY  2011   COLONOSCOPY N/A 10/19/2014   Procedure: COLONOSCOPY;  Surgeon: Rogene Houston, MD;  Location: [facility];  Service: Endoscopy;  Laterality: N/A;  1030   CORONARY STENT INTERVENTION N/A 05/28/2021   Procedure: CORONARY STENT INTERVENTION;  Surgeon: Leonie Man, MD;  Location: [facility];  Service: Cardiovascular;  Laterality: N/A;   ESOPHAGEAL DILATION N/A 05/02/2020   Procedure: ESOPHAGEAL DILATION;  Surgeon: Rogene Houston, MD;  [facility]: [facility];  Service: Gastroenterology;  Laterality: N/A;   ESOPHAGOGASTRODUODENOSCOPY N/A 10/26/2014   Procedure: ESOPHAGOGASTRODUODENOSCOPY (EGD);  Surgeon: Rogene Houston, MD;  [facility];  Service: Endoscopy;  Laterality: N/A;  50 - Dr. has lunch and learn   ESOPHAGOGASTRODUODENOSCOPY N/A 02/24/2022   Procedure: ESOPHAGOGASTRODUODENOSCOPY (EGD);  Surgeon: Irving Copas., MD;  Location: [facility];  Service: Gastroenterology;  Laterality: N/A;   ESOPHAGOGASTRODUODENOSCOPY (EGD) WITH PROPOFOL N/A 05/02/2020   Procedure: ESOPHAGOGASTRODUODENOSCOPY (EGD) WITH PROPOFOL;  Surgeon: Rogene Houston, MD;  [facility];  Service: Gastroenterology;  Laterality: N/A;  250   Esophagogastroduodenoscopy with  esophageal dilation  2004, 2006, 2007   EUS N/A 02/24/2022   Procedure: UPPER ENDOSCOPIC ULTRASOUND (EUS) LINEAR;  Surgeon: Irving Copas., MD;  Location: [facility];  Service: Gastroenterology;  Laterality: N/A;   FINE NEEDLE ASPIRATION  02/24/2022   Procedure: FINE NEEDLE ASPIRATION;  Surgeon: Rush Landmark Telford Nab., MD;  Location: [facility];  Service: Gastroenterology;;  red path sent   GIVENS CAPSULE STUDY N/A 10/31/2014    Procedure: GIVENS CAPSULE STUDY;  Surgeon: Rogene Houston, MD;  [facility];  Service: Endoscopy;  Laterality: N/A;  730 -- pacemaker--needs monitoring--outpatient bed   INSERT / REPLACE / REMOVE PACEMAKER  2010   INTRAVASCULAR ULTRASOUND/IVUS N/A 05/28/2021   Procedure: Intravascular Ultrasound/IVUS;  Surgeon: Leonie Man, MD;  Location: [facility];  Service: Cardiovascular;  Laterality: N/A;   LEFT HEART CATH AND CORONARY ANGIOGRAPHY N/A 03/21/2021   Procedure: LEFT HEART CATH AND CORONARY ANGIOGRAPHY;  Surgeon: Belva Crome, MD;  Location: [facility];  Service: Cardiovascular;  Laterality: N/A;   LEFT HEART CATHETERIZATION WITH CORONARY ANGIOGRAM N/A 11/17/2011   Procedure: LEFT HEART CATHETERIZATION WITH CORONARY ANGIOGRAM;  Surgeon: Sherren Mocha, MD;  Location: [facility];  Service: Cardiovascular;  Laterality: N/A;   LEFT HEART CATHETERIZATION WITH CORONARY ANGIOGRAM N/A 09/26/2014   Procedure: LEFT HEART CATHETERIZATION WITH CORONARY ANGIOGRAM;  Surgeon: Leonie Man, MD;  Location: [facility];  Service: Cardiovascular;  Laterality: N/A;   Right rotator cuff repair     SAVORY DILATION N/A 02/24/2022   Procedure: SAVORY DILATION;  Surgeon: Rush Landmark Telford Nab., MD;  Location: [facility];  Service: Gastroenterology;  Laterality: N/A;   SHOULDER ACROMIOPLASTY Right 05/30/2015   Procedure: RIGHT SHOULDER ACROMIOPLASTY;  Surgeon: Carole Civil, MD;  Location: [facility];  Service: Orthopedics;  Laterality: Right;   SHOULDER OPEN ROTATOR CUFF REPAIR Right 05/30/2015   Procedure: OPEN ROTATOR CUFF REPAIR RIGHT SHOULDER;  Surgeon: Carole Civil, MD;  Location: [facility];  Service: Orthopedics;  Laterality: Right;   SHOULDER OPEN ROTATOR CUFF REPAIR Left 10/22/2016   Procedure: ROTATOR CUFF REPAIR SHOULDER OPEN;  Surgeon: Carole Civil, MD;  Location: [facility];  Service: Orthopedics;  Laterality: Left;   Subxiphoid pericardial window  11/10   VAGINAL  HYSTERECTOMY      Current Outpatient Medications  Medication Sig Dispense Refill   ALPRAZolam (XANAX) 1 MG tablet Take 1 mg by mouth 3 (three) times daily as needed for anxiety.     amiodarone (PACERONE) 200 MG tablet TAKE 1 TABLET ONCE DAILY ON MONDAY THROUGH SATURDAY, NONE ON SUNDAY. 72 tablet 3   amLODipine (NORVASC) 10 MG tablet TAKE (1) TABLET BY MOUTH ONCE DAILY. 30 tablet 5   aspirin EC 81 MG tablet Take 1 tablet (81 mg total) by mouth daily. Swallow whole. 90 tablet 3   Cyanocobalamin (VITAMIN B-12) 5000 MCG SUBL Take 5,000 mcg by mouth daily.     dicyclomine (BENTYL) 10 MG capsule TAKE (1) CAPSULE BY MOUTH TWICE DAILY. 60 capsule 0   folic acid (FOLVITE) 710 MCG tablet Take 400 mcg by mouth daily.     furosemide (LASIX) 40 MG tablet Take 1 tablet (40 mg total) by mouth daily as needed for fluid or edema. 30 tablet 0   isosorbide mononitrate (IMDUR) 30 MG 24 hr tablet Take 2 tablets (60 mg total) by mouth daily. 180 tablet 3   JARDIANCE 10 MG TABS tablet Take 10 mg by mouth daily.     metoprolol  succinate (TOPROL-XL) 25 MG 24 hr tablet TAKE 3 TABLETS BY MOUTH IN THE MORNING AND 2 TABLETS IN THE EVENING. 150 tablet 3   Multiple Vitamin (MULTIVITAMIN WITH MINERALS) TABS tablet Take 1 tablet by mouth daily.      nitroGLYCERIN (NITROSTAT) 0.4 MG SL tablet Place 1 tablet (0.4 mg total) under the tongue every 5 (five) minutes x 3 doses as needed for chest pain. 30 tablet 12   ondansetron (ZOFRAN) 4 MG tablet Take 4 mg by mouth every 6 (six) hours as needed for nausea or vomiting.     OXYGEN Inhale 2 L into the lungs as needed (PRN as needed at night).     pantoprazole (PROTONIX) 40 MG tablet TAKE (1) TABLET BY MOUTH ONCE DAILY. 90 tablet 0   potassium chloride SA (K-DUR,KLOR-CON) 20 MEQ tablet Take 20 mEq by mouth See admin instructions. Takes 1 tablet (20 meq) by mouth daily (scheduled) & may take a second dose if she has a second dose of Lasix prn per patient     primidone (MYSOLINE) 50 MG  tablet Take 50 mg by mouth at bedtime.     PROAIR HFA 108 (90 Base) MCG/ACT inhaler Inhale 1 puff into the lungs every 4 (four) hours as needed for wheezing or shortness of breath.      ranolazine (RANEXA) 500 MG 12 hr tablet TAKE (1) TABLET BY MOUTH TWICE DAILY. 60 tablet 8   rosuvastatin (CRESTOR) 40 MG tablet Take 1 tablet (40 mg total) by mouth daily. (Patient taking differently: Take 40 mg by mouth daily. Takes when she feels like it.) 90 tablet 3   senna-docusate (SENOKOT-S) 8.6-50 MG tablet Take 1 tablet by mouth at bedtime. (Patient taking differently: Take 1-2 tablets by mouth daily as needed (constipation.).) 30 tablet 0   ticagrelor (BRILINTA) 60 MG TABS tablet Take 1 tablet (60 mg total) by mouth 2 (two) times daily. 60 tablet 6   VITAMIN D, CHOLECALCIFEROL, PO Take 1 tablet by mouth daily.     vitamin E 180 MG (400 UNITS) capsule Take 400 Units by mouth daily.     No current facility-administered medications for this visit.   Allergies:  Amitriptyline hcl, Plavix [clopidogrel bisulfate], and Sulfonamide derivatives   ROS: Chronic hearing loss.  Chronic tremor.  Physical Exam: VS:  BP (!) 164/88   Pulse 67   Ht '5\' 5"'$  (1.651 m)   Wt 144 lb (65.3 kg)   SpO2 95%   BMI 23.96 kg/m , BMI Body mass index is 23.96 kg/m.  Wt Readings from Last 3 Encounters:  06/16/22 144 lb (65.3 kg)  06/09/22 142 lb (64.4 kg)  02/24/22 130 lb (59 kg)    General: Patient appears comfortable at rest. HEENT: Conjunctiva and lids normal. Neck: Supple, no elevated JVP or carotid bruits. Lungs: Clear to auscultation, nonlabored breathing at rest. Cardiac: Regular rate and rhythm, no S3, 1/6 systolic murmur. Extremities: No pitting edema.  ECG:  An ECG dated 07/09/2021 was personally reviewed today and demonstrated:  Atrial paced rhythm with nonspecific T wave changes.  Recent Labwork: 12/04/2021: ALT 14; AST 22 05/01/2022: Creatinine, Ser 0.90     Component Value Date/Time   CHOL 200 03/20/2021  1605   TRIG 130 03/20/2021 1605   HDL 49 03/20/2021 1605   CHOLHDL 4.1 03/20/2021 1605   VLDL 26 03/20/2021 1605   LDLCALC 125 (H) 03/20/2021 1605    Other Studies Reviewed Today:  Echocardiogram 03/20/2021:  1. Left ventricular ejection fraction, by estimation,  is 55 to 60%. The  left ventricle has normal function. The left ventricle has no regional  wall motion abnormalities. Left ventricular diastolic parameters are  consistent with Grade II diastolic  dysfunction (pseudonormalization). Elevated left ventricular end-diastolic  pressure.   2. Right ventricular systolic function is normal. The right ventricular  size is normal. There is normal pulmonary artery systolic pressure.   3. Left atrial size was moderately dilated.   4. The mitral valve is degenerative. Mild mitral valve regurgitation. No  evidence of mitral stenosis.   5. There is partial fusion of the left and non-coronary cusps. The aortic  valve is calcified. There is mild calcification of the aortic valve. There  is mild thickening of the aortic valve. Aortic valve regurgitation is  mild. Mild aortic valve sclerosis  is present, with no evidence of aortic valve stenosis. Aortic  regurgitation PHT measures 582 msec. Aortic valve area, by VTI measures  2.49 cm. Aortic valve mean gradient measures 3.5 mmHg. Aortic valve Vmax  measures 1.23 m/s.   6. Aortic dilatation noted. There is mild dilatation of the aortic root,  measuring 37 mm. There is mild dilatation of the ascending aorta,  measuring 38 mm.   7. The inferior vena cava is normal in size with greater than 50%  respiratory variability, suggesting right atrial pressure of 3 mmHg.   Assessment and Plan:  1.  Multivessel CAD status post complex PCI of the RCA including shockwave lithotripsy and score flex angioplasty, DES x2 to the mid to distal RCA, and angioplasty alone to the ostial/proximal RCA in-stent restenosis in October 2022 plan is to continue medical  therapy, she does have intermittent angina.  Plan to reduce Brilinta to 60 mg twice daily.  Continue aspirin, Norvasc, Imdur, Jardiance, Toprol-XL, Ranexa, Crestor, and as needed nitroglycerin.  2.  Paroxysmal atrial fibrillation with CHA2DS2-VASc score of 5.  She declines anticoagulation and otherwise remains on amiodarone for rhythm suppression.  3.  Sinus node dysfunction with Medtronic pacemaker in place and follow-up with Dr. Lovena Le.  Nearing generator change.  Medication Adjustments/Labs and Tests Ordered: Current medicines are reviewed at length with the patient today.  Concerns regarding medicines are outlined above.   Tests Ordered: Orders Placed This Encounter  Procedures   EKG 12-Lead    Medication Changes: Meds ordered this encounter  Medications   ticagrelor (BRILINTA) 60 MG TABS tablet    Sig: Take 1 tablet (60 mg total) by mouth 2 (two) times daily.    Dispense:  60 tablet    Refill:  6    06/16/2022 dose decrease    Disposition:  Follow up  6 months.  Signed, Satira Sark, MD, [facility] 06/16/2022 2:54 PM    [facility], [place], [place] [address] Phone: [phone]; Fax: [phone]

## 2022-06-16 NOTE — Patient Instructions (Addendum)
Medication Instructions:  Your physician has recommended you make the following change in your medication:  Decrease brilinta to 60 mg twice daily Continue other medications the same  Labwork: none  Testing/Procedures: none  Follow-Up: Your physician recommends that you schedule a follow-up appointment in: 6 months  Any Other Special Instructions Will Be Listed Below (If Applicable).  If you need a refill on your cardiac medications before your next appointment, please call your pharmacy.

## 2022-06-19 ENCOUNTER — Ambulatory Visit (INDEPENDENT_AMBULATORY_CARE_PROVIDER_SITE_OTHER): Payer: Medicare Other

## 2022-06-19 DIAGNOSIS — I495 Sick sinus syndrome: Secondary | ICD-10-CM

## 2022-06-20 ENCOUNTER — Telehealth: Payer: Self-pay

## 2022-06-20 LAB — CUP PACEART REMOTE DEVICE CHECK
Battery Impedance: 6965 Ohm
Battery Voltage: 2.62 V
Brady Statistic RV Percent Paced: 51 %
Date Time Interrogation Session: 20231116154228
Implantable Lead Connection Status: 753985
Implantable Lead Connection Status: 753985
Implantable Lead Implant Date: 20100526
Implantable Lead Implant Date: 20100526
Implantable Lead Location: 753859
Implantable Lead Location: 753860
Implantable Lead Model: 5076
Implantable Lead Model: 5076
Implantable Pulse Generator Implant Date: 20100526
Lead Channel Impedance Value: 419 Ohm
Lead Channel Impedance Value: 67 Ohm
Lead Channel Setting Pacing Amplitude: 2.5 V
Lead Channel Setting Pacing Pulse Width: 0.4 ms
Lead Channel Setting Sensing Sensitivity: 2.8 mV
Zone Setting Status: 755011
Zone Setting Status: 755011

## 2022-06-20 NOTE — Telephone Encounter (Signed)
Spoke with patient informed her that her PPM had reached ERI on 05/30/22, patient agreeable to apt with Dr. Lovena Le in [place] on 07/01/22 at 3:45 patient relies on transportation to bring her back and forth to appointments

## 2022-06-29 ENCOUNTER — Other Ambulatory Visit: Payer: Self-pay

## 2022-06-29 ENCOUNTER — Emergency Department (HOSPITAL_COMMUNITY): Payer: Medicare Other

## 2022-06-29 ENCOUNTER — Inpatient Hospital Stay (HOSPITAL_COMMUNITY)
Admission: EM | Admit: 2022-06-29 | Discharge: 2022-07-02 | DRG: 190 | Disposition: A | Discharge status: Home Health | Payer: Medicare Other | Attending: Family Medicine | Admitting: Family Medicine

## 2022-06-29 ENCOUNTER — Encounter (HOSPITAL_COMMUNITY): Payer: Self-pay | Admitting: *Deleted

## 2022-06-29 DIAGNOSIS — I509 Heart failure, unspecified: Principal | ICD-10-CM

## 2022-06-29 DIAGNOSIS — Z79899 Other long term (current) drug therapy: Secondary | ICD-10-CM

## 2022-06-29 DIAGNOSIS — Z7902 Long term (current) use of antithrombotics/antiplatelets: Secondary | ICD-10-CM

## 2022-06-29 DIAGNOSIS — Z950 Presence of cardiac pacemaker: Secondary | ICD-10-CM | POA: Diagnosis present

## 2022-06-29 DIAGNOSIS — Z7984 Long term (current) use of oral hypoglycemic drugs: Secondary | ICD-10-CM | POA: Diagnosis not present

## 2022-06-29 DIAGNOSIS — Z66 Do not resuscitate: Secondary | ICD-10-CM | POA: Diagnosis present

## 2022-06-29 DIAGNOSIS — I700 Atherosclerosis of aorta: Secondary | ICD-10-CM | POA: Diagnosis not present

## 2022-06-29 DIAGNOSIS — R0602 Shortness of breath: Secondary | ICD-10-CM | POA: Diagnosis not present

## 2022-06-29 DIAGNOSIS — Z955 Presence of coronary angioplasty implant and graft: Secondary | ICD-10-CM

## 2022-06-29 DIAGNOSIS — D7389 Other diseases of spleen: Secondary | ICD-10-CM | POA: Diagnosis not present

## 2022-06-29 DIAGNOSIS — I110 Hypertensive heart disease with heart failure: Secondary | ICD-10-CM | POA: Diagnosis not present

## 2022-06-29 DIAGNOSIS — J9811 Atelectasis: Secondary | ICD-10-CM | POA: Diagnosis not present

## 2022-06-29 DIAGNOSIS — Z743 Need for continuous supervision: Secondary | ICD-10-CM | POA: Diagnosis not present

## 2022-06-29 DIAGNOSIS — I5033 Acute on chronic diastolic (congestive) heart failure: Secondary | ICD-10-CM | POA: Diagnosis not present

## 2022-06-29 DIAGNOSIS — R531 Weakness: Secondary | ICD-10-CM | POA: Diagnosis not present

## 2022-06-29 DIAGNOSIS — I480 Paroxysmal atrial fibrillation: Secondary | ICD-10-CM

## 2022-06-29 DIAGNOSIS — I25110 Atherosclerotic heart disease of native coronary artery with unstable angina pectoris: Secondary | ICD-10-CM | POA: Diagnosis not present

## 2022-06-29 DIAGNOSIS — Z1152 Encounter for screening for COVID-19: Secondary | ICD-10-CM | POA: Diagnosis not present

## 2022-06-29 DIAGNOSIS — Z87891 Personal history of nicotine dependence: Secondary | ICD-10-CM | POA: Diagnosis not present

## 2022-06-29 DIAGNOSIS — I10 Essential (primary) hypertension: Secondary | ICD-10-CM | POA: Diagnosis present

## 2022-06-29 DIAGNOSIS — R069 Unspecified abnormalities of breathing: Secondary | ICD-10-CM | POA: Diagnosis not present

## 2022-06-29 DIAGNOSIS — Z8249 Family history of ischemic heart disease and other diseases of the circulatory system: Secondary | ICD-10-CM | POA: Diagnosis not present

## 2022-06-29 DIAGNOSIS — I495 Sick sinus syndrome: Secondary | ICD-10-CM | POA: Diagnosis not present

## 2022-06-29 DIAGNOSIS — R251 Tremor, unspecified: Secondary | ICD-10-CM | POA: Diagnosis present

## 2022-06-29 DIAGNOSIS — I241 Dressler's syndrome: Secondary | ICD-10-CM | POA: Diagnosis present

## 2022-06-29 DIAGNOSIS — I4891 Unspecified atrial fibrillation: Secondary | ICD-10-CM | POA: Diagnosis not present

## 2022-06-29 DIAGNOSIS — Z825 Family history of asthma and other chronic lower respiratory diseases: Secondary | ICD-10-CM | POA: Diagnosis not present

## 2022-06-29 DIAGNOSIS — I5022 Chronic systolic (congestive) heart failure: Secondary | ICD-10-CM | POA: Diagnosis present

## 2022-06-29 DIAGNOSIS — E785 Hyperlipidemia, unspecified: Secondary | ICD-10-CM | POA: Diagnosis not present

## 2022-06-29 DIAGNOSIS — K219 Gastro-esophageal reflux disease without esophagitis: Secondary | ICD-10-CM | POA: Diagnosis not present

## 2022-06-29 DIAGNOSIS — I2510 Atherosclerotic heart disease of native coronary artery without angina pectoris: Secondary | ICD-10-CM | POA: Diagnosis not present

## 2022-06-29 DIAGNOSIS — Z882 Allergy status to sulfonamides status: Secondary | ICD-10-CM

## 2022-06-29 DIAGNOSIS — R6889 Other general symptoms and signs: Secondary | ICD-10-CM | POA: Diagnosis not present

## 2022-06-29 DIAGNOSIS — J9601 Acute respiratory failure with hypoxia: Secondary | ICD-10-CM

## 2022-06-29 DIAGNOSIS — J441 Chronic obstructive pulmonary disease with (acute) exacerbation: Secondary | ICD-10-CM | POA: Diagnosis not present

## 2022-06-29 DIAGNOSIS — J432 Centrilobular emphysema: Secondary | ICD-10-CM | POA: Diagnosis not present

## 2022-06-29 DIAGNOSIS — K8689 Other specified diseases of pancreas: Secondary | ICD-10-CM | POA: Diagnosis not present

## 2022-06-29 DIAGNOSIS — I4819 Other persistent atrial fibrillation: Secondary | ICD-10-CM | POA: Diagnosis present

## 2022-06-29 DIAGNOSIS — Z7982 Long term (current) use of aspirin: Secondary | ICD-10-CM | POA: Diagnosis not present

## 2022-06-29 DIAGNOSIS — J449 Chronic obstructive pulmonary disease, unspecified: Secondary | ICD-10-CM | POA: Diagnosis present

## 2022-06-29 DIAGNOSIS — J9621 Acute and chronic respiratory failure with hypoxia: Secondary | ICD-10-CM | POA: Diagnosis not present

## 2022-06-29 DIAGNOSIS — R9431 Abnormal electrocardiogram [ECG] [EKG]: Secondary | ICD-10-CM | POA: Diagnosis not present

## 2022-06-29 DIAGNOSIS — Z888 Allergy status to other drugs, medicaments and biological substances status: Secondary | ICD-10-CM | POA: Diagnosis not present

## 2022-06-29 DIAGNOSIS — J9611 Chronic respiratory failure with hypoxia: Secondary | ICD-10-CM | POA: Diagnosis present

## 2022-06-29 DIAGNOSIS — J90 Pleural effusion, not elsewhere classified: Secondary | ICD-10-CM | POA: Diagnosis not present

## 2022-06-29 DIAGNOSIS — I7789 Other specified disorders of arteries and arterioles: Secondary | ICD-10-CM | POA: Diagnosis not present

## 2022-06-29 DIAGNOSIS — J811 Chronic pulmonary edema: Secondary | ICD-10-CM | POA: Diagnosis not present

## 2022-06-29 LAB — CBC WITH DIFFERENTIAL/PLATELET
Abs Immature Granulocytes: 0.01 10*3/uL (ref 0.00–0.07)
Basophils Absolute: 0.1 10*3/uL (ref 0.0–0.1)
Basophils Relative: 1 %
Eosinophils Absolute: 0.1 10*3/uL (ref 0.0–0.5)
Eosinophils Relative: 2 %
HCT: 42.2 % (ref 36.0–46.0)
Hemoglobin: 13 g/dL (ref 12.0–15.0)
Immature Granulocytes: 0 %
Lymphocytes Relative: 26 %
Lymphs Abs: 1.2 10*3/uL (ref 0.7–4.0)
MCH: 29.9 pg (ref 26.0–34.0)
MCHC: 30.8 g/dL (ref 30.0–36.0)
MCV: 97 fL (ref 80.0–100.0)
Monocytes Absolute: 0.4 10*3/uL (ref 0.1–1.0)
Monocytes Relative: 9 %
Neutro Abs: 2.9 10*3/uL (ref 1.7–7.7)
Neutrophils Relative %: 62 %
Platelets: 190 10*3/uL (ref 150–400)
RBC: 4.35 MIL/uL (ref 3.87–5.11)
RDW: 14.6 % (ref 11.5–15.5)
WBC: 4.7 10*3/uL (ref 4.0–10.5)
nRBC: 0 % (ref 0.0–0.2)

## 2022-06-29 LAB — I-STAT CHEM 8, ED
BUN: 7 mg/dL — ABNORMAL LOW (ref 8–23)
Calcium, Ion: 1.18 mmol/L (ref 1.15–1.40)
Chloride: 100 mmol/L (ref 98–111)
Creatinine, Ser: 0.8 mg/dL (ref 0.44–1.00)
Glucose, Bld: 97 mg/dL (ref 70–99)
HCT: 45 % (ref 36.0–46.0)
Hemoglobin: 15.3 g/dL — ABNORMAL HIGH (ref 12.0–15.0)
Potassium: 4.1 mmol/L (ref 3.5–5.1)
Sodium: 141 mmol/L (ref 135–145)
TCO2: 32 mmol/L (ref 22–32)

## 2022-06-29 LAB — COMPREHENSIVE METABOLIC PANEL
ALT: 18 U/L (ref 0–44)
AST: 28 U/L (ref 15–41)
Albumin: 4 g/dL (ref 3.5–5.0)
Alkaline Phosphatase: 87 U/L (ref 38–126)
Anion gap: 9 (ref 5–15)
BUN: 8 mg/dL (ref 8–23)
CO2: 30 mmol/L (ref 22–32)
Calcium: 9.4 mg/dL (ref 8.9–10.3)
Chloride: 101 mmol/L (ref 98–111)
Creatinine, Ser: 0.77 mg/dL (ref 0.44–1.00)
GFR, Estimated: >60 mL/min (ref >60)
Glucose, Bld: 100 mg/dL — ABNORMAL HIGH (ref 70–99)
Potassium: 4 mmol/L (ref 3.5–5.1)
Sodium: 140 mmol/L (ref 135–145)
Total Bilirubin: 0.5 mg/dL (ref 0.3–1.2)
Total Protein: 8 g/dL (ref 6.5–8.1)

## 2022-06-29 LAB — BLOOD GAS, VENOUS
Acid-Base Excess: 7.1 mmol/L — ABNORMAL HIGH (ref 0.0–2.0)
Bicarbonate: 33.9 mmol/L — ABNORMAL HIGH (ref 20.0–28.0)
Drawn by: 66460
O2 Saturation: 56.5 %
Patient temperature: 36.7
pCO2, Ven: 55 mmHg (ref 44–60)
pH, Ven: 7.39 (ref 7.25–7.43)
pO2, Ven: <31 mmHg — CL (ref 32–45)

## 2022-06-29 LAB — RESP PANEL BY RT-PCR (FLU A&B, COVID) ARPGX2
Influenza A by PCR: NEGATIVE
Influenza B by PCR: NEGATIVE
SARS Coronavirus 2 by RT PCR: NEGATIVE

## 2022-06-29 LAB — BRAIN NATRIURETIC PEPTIDE: B Natriuretic Peptide: 941 pg/mL — ABNORMAL HIGH (ref 0.0–100.0)

## 2022-06-29 LAB — TROPONIN I (HIGH SENSITIVITY)
Troponin I (High Sensitivity): 10 ng/L (ref <18)
Troponin I (High Sensitivity): 9 ng/L (ref <18)

## 2022-06-29 LAB — MAGNESIUM: Magnesium: 2.2 mg/dL (ref 1.7–2.4)

## 2022-06-29 MED ORDER — METOPROLOL SUCCINATE ER 50 MG PO TB24
50.0000 mg | ORAL_TABLET | Freq: Once | ORAL | Status: AC
  Administered 2022-06-29: 50 mg via ORAL
  Filled 2022-06-29: qty 1

## 2022-06-29 MED ORDER — ACETAMINOPHEN 325 MG PO TABS: 650.0000 mg | ORAL_TABLET | Freq: Four times a day (QID) | ORAL | Status: DC | PRN

## 2022-06-29 MED ORDER — PANTOPRAZOLE SODIUM 40 MG PO TBEC
40.0000 mg | DELAYED_RELEASE_TABLET | Freq: Every day | ORAL | Status: DC
  Administered 2022-06-30 – 2022-07-02 (×3): 40 mg via ORAL
  Filled 2022-06-29 (×3): qty 1

## 2022-06-29 MED ORDER — METHYLPREDNISOLONE SODIUM SUCC 125 MG IJ SOLR
60.0000 mg | Freq: Two times a day (BID) | INTRAMUSCULAR | Status: AC
  Administered 2022-06-30 (×2): 60 mg via INTRAVENOUS
  Filled 2022-06-29 (×2): qty 2

## 2022-06-29 MED ORDER — AMIODARONE HCL 200 MG PO TABS
200.0000 mg | ORAL_TABLET | Freq: Every day | ORAL | Status: DC
  Administered 2022-06-30 – 2022-07-02 (×3): 200 mg via ORAL
  Filled 2022-06-29 (×3): qty 1

## 2022-06-29 MED ORDER — AMLODIPINE BESYLATE 5 MG PO TABS
10.0000 mg | ORAL_TABLET | Freq: Every day | ORAL | Status: DC
  Administered 2022-06-29 – 2022-07-02 (×3): 10 mg via ORAL
  Filled 2022-06-29 (×4): qty 2

## 2022-06-29 MED ORDER — SENNOSIDES-DOCUSATE SODIUM 8.6-50 MG PO TABS: 1.0000 | ORAL_TABLET | Freq: Every day | ORAL | Status: DC | PRN

## 2022-06-29 MED ORDER — IPRATROPIUM-ALBUTEROL 0.5-2.5 (3) MG/3ML IN SOLN
3.0000 mL | Freq: Once | RESPIRATORY_TRACT | Status: AC
  Administered 2022-06-29: 3 mL via RESPIRATORY_TRACT
  Filled 2022-06-29: qty 3

## 2022-06-29 MED ORDER — METOPROLOL SUCCINATE ER 50 MG PO TB24
50.0000 mg | ORAL_TABLET | Freq: Two times a day (BID) | ORAL | Status: DC
  Administered 2022-06-30 – 2022-07-02 (×5): 50 mg via ORAL
  Filled 2022-06-29 (×5): qty 1

## 2022-06-29 MED ORDER — NITROGLYCERIN IN D5W 200-5 MCG/ML-% IV SOLN
5.0000 ug/min | INTRAVENOUS | Status: DC
  Filled 2022-06-29: qty 250

## 2022-06-29 MED ORDER — ROSUVASTATIN CALCIUM 20 MG PO TABS
40.0000 mg | ORAL_TABLET | Freq: Every day | ORAL | Status: DC
  Administered 2022-06-30 – 2022-07-02 (×3): 40 mg via ORAL
  Filled 2022-06-29 (×3): qty 2

## 2022-06-29 MED ORDER — ENOXAPARIN SODIUM 40 MG/0.4ML IJ SOSY
40.0000 mg | PREFILLED_SYRINGE | INTRAMUSCULAR | Status: DC
  Administered 2022-06-29 – 2022-07-01 (×3): 40 mg via SUBCUTANEOUS
  Filled 2022-06-29 (×3): qty 0.4

## 2022-06-29 MED ORDER — FUROSEMIDE 10 MG/ML IJ SOLN
40.0000 mg | Freq: Once | INTRAMUSCULAR | Status: AC
  Administered 2022-06-30: 40 mg via INTRAVENOUS
  Filled 2022-06-29: qty 4

## 2022-06-29 MED ORDER — FUROSEMIDE 10 MG/ML IJ SOLN
40.0000 mg | Freq: Once | INTRAMUSCULAR | Status: AC
  Administered 2022-06-29: 40 mg via INTRAVENOUS
  Filled 2022-06-29: qty 4

## 2022-06-29 MED ORDER — IPRATROPIUM-ALBUTEROL 0.5-2.5 (3) MG/3ML IN SOLN
3.0000 mL | RESPIRATORY_TRACT | Status: DC | PRN
  Administered 2022-06-29: 3 mL via RESPIRATORY_TRACT
  Filled 2022-06-29: qty 3

## 2022-06-29 MED ORDER — PREDNISONE 20 MG PO TABS
40.0000 mg | ORAL_TABLET | Freq: Every day | ORAL | Status: DC
  Administered 2022-07-01 – 2022-07-02 (×2): 40 mg via ORAL
  Filled 2022-06-29 (×2): qty 2

## 2022-06-29 MED ORDER — POLYETHYLENE GLYCOL 3350 17 G PO PACK: 17.0000 g | PACK | Freq: Every day | ORAL | Status: DC | PRN

## 2022-06-29 MED ORDER — GUAIFENESIN-DM 100-10 MG/5ML PO SYRP
10.0000 mL | ORAL_SOLUTION | Freq: Three times a day (TID) | ORAL | Status: AC
  Administered 2022-06-29 – 2022-06-30 (×4): 10 mL via ORAL
  Filled 2022-06-29 (×4): qty 10

## 2022-06-29 MED ORDER — IPRATROPIUM-ALBUTEROL 0.5-2.5 (3) MG/3ML IN SOLN: 3.0000 mL | Freq: Four times a day (QID) | RESPIRATORY_TRACT | Status: DC

## 2022-06-29 MED ORDER — ASPIRIN 81 MG PO TBEC
81.0000 mg | DELAYED_RELEASE_TABLET | Freq: Every day | ORAL | Status: DC
  Administered 2022-06-30 – 2022-07-02 (×3): 81 mg via ORAL
  Filled 2022-06-29 (×3): qty 1

## 2022-06-29 MED ORDER — ISOSORBIDE MONONITRATE ER 60 MG PO TB24
60.0000 mg | ORAL_TABLET | Freq: Every day | ORAL | Status: DC
  Administered 2022-06-29 – 2022-07-01 (×3): 60 mg via ORAL
  Filled 2022-06-29 (×5): qty 1

## 2022-06-29 MED ORDER — ACETAMINOPHEN 650 MG RE SUPP: 650.0000 mg | Freq: Four times a day (QID) | RECTAL | Status: DC | PRN

## 2022-06-29 MED ORDER — PREDNISONE 50 MG PO TABS
60.0000 mg | ORAL_TABLET | Freq: Once | ORAL | Status: AC
  Administered 2022-06-29: 60 mg via ORAL
  Filled 2022-06-29: qty 1

## 2022-06-29 MED ORDER — TICAGRELOR 60 MG PO TABS
60.0000 mg | ORAL_TABLET | Freq: Two times a day (BID) | ORAL | Status: DC
  Administered 2022-06-30: 60 mg via ORAL
  Filled 2022-06-29 (×10): qty 1

## 2022-06-29 MED ORDER — IOHEXOL 350 MG/ML SOLN
100.0000 mL | Freq: Once | INTRAVENOUS | Status: AC | PRN
  Administered 2022-06-29: 100 mL via INTRAVENOUS

## 2022-06-29 MED ORDER — RANOLAZINE ER 500 MG PO TB12
500.0000 mg | ORAL_TABLET | Freq: Two times a day (BID) | ORAL | Status: DC
  Administered 2022-06-29 – 2022-07-02 (×5): 500 mg via ORAL
  Filled 2022-06-29 (×6): qty 1

## 2022-06-29 MED ORDER — DOXYCYCLINE HYCLATE 100 MG PO TABS
100.0000 mg | ORAL_TABLET | Freq: Two times a day (BID) | ORAL | Status: DC
  Administered 2022-06-29 – 2022-07-02 (×6): 100 mg via ORAL
  Filled 2022-06-29 (×6): qty 1

## 2022-06-29 NOTE — Assessment & Plan Note (Addendum)
-  Reports chest discomfort which is likely from COPD exacerbation.   -Troponins 10 >>9.   -EKG and telemetry without significant changes suggesting ischemia.   -Resume Brilinta, aspirin, metoprolol, statin and the use of ranolazine

## 2022-06-29 NOTE — ED Provider Notes (Addendum)
[facility] EMERGENCY DEPARTMENT Provider Note   CSN: [id number] Arrival date & time: 06/29/22  1050     History  Chief Complaint  Patient presents with   hypoxic    [name] is a 86 y.o. female.  HPI Patient presents with breath.  Medical history includes HTN, CAD, CHF, HLD, pericardial effusion s/p pericardial window in 2010, atrial fibrillation, GERD, anemia, esophageal dysphagia, COPD, anxiety, and tachycardia-bradycardia syndrome s/p pacemaker placement.  Patient reports shortness of breath for the past 3 days.  She has ran out of her breathing treatment medicines.  She has had a dry cough.  Shortness of breath worsened today which prompted her to call EMS.  EMS noted hypoxia with SPO2 in the 70s with standing on room air.  She was placed on supplemental oxygen.  When she stood up, heart rate increased to 200s.  Patient reports that she has had recent chest discomfort.  Although this has improved, she does endorse continued mild left-sided chest discomfort as well as epigastric discomfort.  She endorses continued shortness of breath.  She has a tremor at baseline.    Home Medications Prior to Admission medications   Medication Sig Start Date End Date Taking? Authorizing Provider  ALPRAZolam Duanne Moron) 1 MG tablet Take 1 mg by mouth 3 (three) times daily as needed for anxiety. 04/04/21   [provider]  amiodarone (PACERONE) 200 MG tablet TAKE 1 TABLET ONCE DAILY ON MONDAY THROUGH SATURDAY, NONE ON SUNDAY. 09/26/21   Evans Lance, MD  amLODipine (NORVASC) 10 MG tablet TAKE (1) TABLET BY MOUTH ONCE DAILY. 06/06/22   Satira Sark, MD  aspirin EC 81 MG tablet Take 1 tablet (81 mg total) by mouth daily. Swallow whole. 07/06/20   Evans Lance, MD  Cyanocobalamin (VITAMIN B-12) 5000 MCG SUBL Take 5,000 mcg by mouth daily.    [provider]  dicyclomine (BENTYL) 10 MG capsule TAKE (1) CAPSULE BY MOUTH TWICE DAILY. 06/06/22   Harvel Quale, MD  folic  acid (FOLVITE) 034 MCG tablet Take 400 mcg by mouth daily.    [provider]  furosemide (LASIX) 40 MG tablet Take 1 tablet (40 mg total) by mouth daily as needed for fluid or edema. 03/22/21   Florencia Reasons, MD  isosorbide mononitrate (IMDUR) 30 MG 24 hr tablet Take 2 tablets (60 mg total) by mouth daily. 03/17/22   Satira Sark, MD  JARDIANCE 10 MG TABS tablet Take 10 mg by mouth daily. 02/07/21   [provider]  metoprolol succinate (TOPROL-XL) 25 MG 24 hr tablet TAKE 3 TABLETS BY MOUTH IN THE MORNING AND 2 TABLETS IN THE EVENING. 04/14/22   Satira Sark, MD  Multiple Vitamin (MULTIVITAMIN WITH MINERALS) TABS tablet Take 1 tablet by mouth daily.     [provider]  nitroGLYCERIN (NITROSTAT) 0.4 MG SL tablet Place 1 tablet (0.4 mg total) under the tongue every 5 (five) minutes x 3 doses as needed for chest pain. 03/22/21   Florencia Reasons, MD  ondansetron (ZOFRAN) 4 MG tablet Take 4 mg by mouth every 6 (six) hours as needed for nausea or vomiting. 03/12/20   [provider]  OXYGEN Inhale 2 L into the lungs as needed (PRN as needed at night).    [provider]  pantoprazole (PROTONIX) 40 MG tablet TAKE (1) TABLET BY MOUTH ONCE DAILY. 03/17/22   Satira Sark, MD  potassium chloride SA (K-DUR,KLOR-CON) 20 MEQ tablet Take 20 mEq by mouth  See admin instructions. Takes 1 tablet (20 meq) by mouth daily (scheduled) & may take a second dose if she has a second dose of Lasix prn per patient    Rexene Alberts, MD  primidone (MYSOLINE) 50 MG tablet Take 50 mg by mouth at bedtime. 10/31/10   [provider]  PROAIR HFA 108 (90 Base) MCG/ACT inhaler Inhale 1 puff into the lungs every 4 (four) hours as needed for wheezing or shortness of breath.  11/10/18   [provider]  ranolazine (RANEXA) 500 MG 12 hr tablet TAKE (1) TABLET BY MOUTH TWICE DAILY. 04/24/22   Isaiah Serge, NP  rosuvastatin (CRESTOR) 40 MG tablet Take 1 tablet (40 mg total) by mouth  daily. Patient taking differently: Take 40 mg by mouth daily. Takes when she feels like it. 10/01/21   Satira Sark, MD  senna-docusate (SENOKOT-S) 8.6-50 MG tablet Take 1 tablet by mouth at bedtime. Patient taking differently: Take 1-2 tablets by mouth daily as needed (constipation.). 03/22/21   Florencia Reasons, MD  ticagrelor (BRILINTA) 60 MG TABS tablet Take 1 tablet (60 mg total) by mouth 2 (two) times daily. 06/16/22   Satira Sark, MD  VITAMIN D, CHOLECALCIFEROL, PO Take 1 tablet by mouth daily.    [provider]  vitamin E 180 MG (400 UNITS) capsule Take 400 Units by mouth daily.    [provider]      Allergies    Amitriptyline hcl, Plavix [clopidogrel bisulfate], and Sulfonamide derivatives    Review of Systems   Review of Systems  Constitutional:  Positive for fatigue.  Respiratory:  Positive for cough and shortness of breath.   Cardiovascular:  Positive for chest pain.  Neurological:  Positive for tremors (Baseline) and weakness (Generalized).  All other systems reviewed and are negative.   Physical Exam Updated Vital Signs BP (!) 190/93   Pulse 66   Temp 98.1 F (36.7 C) (Oral)   Resp (!) 30   Ht '5\' 5"'$  (1.651 m)   Wt 65.3 kg   SpO2 95%   BMI 23.96 kg/m  Physical Exam Vitals and nursing note reviewed.  Constitutional:      General: She is not in acute distress.    Appearance: She is well-developed. She is ill-appearing. She is not toxic-appearing or diaphoretic.  HENT:     Head: Normocephalic and atraumatic.     Right Ear: External ear normal.     Left Ear: External ear normal.     Nose: Nose normal.     Mouth/Throat:     Mouth: Mucous membranes are dry.  Eyes:     Extraocular Movements: Extraocular movements intact.     Conjunctiva/sclera: Conjunctivae normal.  Cardiovascular:     Rate and Rhythm: Normal rate. Rhythm irregular.     Heart sounds: No murmur heard. Pulmonary:     Effort: Tachypnea present. No respiratory distress.      Breath sounds: Decreased air movement present. Decreased breath sounds present.  Chest:     Chest wall: Tenderness present.  Abdominal:     General: There is no distension.     Palpations: Abdomen is soft.     Tenderness: There is abdominal tenderness.  Musculoskeletal:        General: No swelling. Normal range of motion.     Cervical back: Normal range of motion and neck supple. No rigidity.     Right lower leg: No edema.     Left lower leg: No edema.  Skin:    General: Skin is warm and dry.     Coloration: Skin is not jaundiced or pale.  Neurological:     General: No focal deficit present.     Mental Status: She is alert and oriented to person, place, and time.  Psychiatric:        Mood and Affect: Mood normal.        Behavior: Behavior normal.        Thought Content: Thought content normal.        Judgment: Judgment normal.     ED Results / Procedures / Treatments   Labs (all labs ordered are listed, but only abnormal results are displayed) Labs Reviewed  COMPREHENSIVE METABOLIC PANEL - Abnormal; Notable for the following components:      Result Value   Glucose, Bld 100 (*)    All other components within normal limits  BRAIN NATRIURETIC PEPTIDE - Abnormal; Notable for the following components:   B Natriuretic Peptide 941.0 (*)    All other components within normal limits  BLOOD GAS, VENOUS - Abnormal; Notable for the following components:   pO2, Ven <31 (*)    Bicarbonate 33.9 (*)    Acid-Base Excess 7.1 (*)    All other components within normal limits  I-STAT CHEM 8, ED - Abnormal; Notable for the following components:   BUN 7 (*)    Hemoglobin 15.3 (*)    All other components within normal limits  RESP PANEL BY RT-PCR (FLU A&B, COVID) ARPGX2  MAGNESIUM  CBC WITH DIFFERENTIAL/PLATELET  TROPONIN I (HIGH SENSITIVITY)  TROPONIN I (HIGH SENSITIVITY)    EKG EKG Interpretation  Date/Time:  Sunday June 29 2022 11:08:11 EST Ventricular Rate:  72 PR  Interval:    QRS Duration: 92 QT Interval:  471 QTC Calculation: 554 R Axis:   53 Text Interpretation: Sinus rhythm Premature ventricular complexes Confirmed by Godfrey Pick [id number]) on 06/29/2022 3:07:05 PM  Radiology DG Chest Port 1 View  Result Date: 06/29/2022 CLINICAL DATA:  Shortness of breath EXAM: PORTABLE CHEST 1 VIEW COMPARISON:  03/30/2021 radiograph and prior studies FINDINGS: Cardiomegaly and LEFT-sided pacemaker again noted. Mild pulmonary vascular congestion is noted. Trace RIGHT pleural effusion noted with mild bibasilar atelectasis. There is no evidence of pneumothorax or acute bony abnormality. IMPRESSION: Cardiomegaly with mild pulmonary vascular congestion, trace RIGHT pleural effusion and mild bibasilar atelectasis. Electronically Signed   By: Margarette Canada M.D.   On: 06/29/2022 11:47    Procedures Procedures    Medications Ordered in ED Medications  ipratropium-albuterol (DUONEB) 0.5-2.5 (3) MG/3ML nebulizer solution 3 mL (3 mLs Nebulization Given 06/29/22 1151)  predniSONE (DELTASONE) tablet 60 mg (60 mg Oral Given 06/29/22 1229)  furosemide (LASIX) injection 40 mg (40 mg Intravenous Given 06/29/22 1340)    ED Course/ Medical Decision Making/ A&P                           Medical Decision Making Amount and/or Complexity of Data Reviewed Labs: ordered. Radiology: ordered.  Risk Prescription drug management. Decision regarding hospitalization.   This patient presents to the ED for concern of shortness of breath, this involves an extensive number of treatment options, and is a complaint that carries with it a high risk of complications and morbidity.  The differential diagnosis includes COPD exacerbation, CHF exacerbation, pericardial effusion, pulmonary edema, pleural effusion, pneumonia, URI   Co morbidities that complicate the patient evaluation  HTN, CAD, CHF, HLD, pericardial  effusion s/p pericardial window in 2010, atrial fibrillation, GERD, anemia,  esophageal dysphagia, COPD, anxiety, and tachycardia-bradycardia syndrome s/p pacemaker placement   Additional history obtained:  Additional history obtained from N/A External records from outside source obtained and reviewed including EMR   Lab Tests:  I Ordered, and personally interpreted labs.  The pertinent results include: Normal hemoglobin, no leukocytosis, normal kidney function, normal electrolytes, elevated BNP, normal troponin   Imaging Studies ordered:  I ordered imaging studies including chest x-ray I independently visualized and interpreted imaging which showed cardiomegaly with pulmonary vascular congestion and trace right pleural effusion I agree with the radiologist interpretation   Cardiac Monitoring: / EKG:  The patient was maintained on a cardiac monitor.  I personally viewed and interpreted the cardiac monitored which showed an underlying rhythm of: Sinus rhythm  Problem List / ED Course / Critical interventions / Medication management  Patient presents for shortness of breath.  EMS noted hypoxia on scene.  They placed on 4 L of supplemental oxygen.  On arrival, patient is able to be weaned down to 2 L of oxygen.  She continues to be tachypneic with mildly increased work of breathing.  On lung auscultation, she has severely diminished air movement.  Given her history of COPD, treatment for COPD exacerbation was initiated.  Patient has not had a recent dry cough which she states has been nonproductive.  She has additionally had some recent chest discomfort.  EKG shows sinus rhythm with frequent PVCs.  Diagnostic work-up was initiated.  Patient was able to maintain normal SPO2 on 2 L of supplemental oxygen.  She did continue to have tachypnea.  BNP is elevated and this suggests a CHF exacerbation component of her symptoms.  Lab work also shows normal electrolytes.  Dose of Lasix was given.  Patient has a history of pericardial effusion.  She did undergo pericardial window  in 2010.  On bedside ultrasound, no pericardial effusion is appreciated.  Attempts at pacemaker interrogation were made.  [employer] rep had to be called in.  At this time, pacemaker interrogation is pending.  Due to her persistent tachypnea, BiPAP was initiated for help with work of breathing.  Given her shortness of breath, hypoxia, and chest/epigastric discomfort, patient to undergo CTA dissection study.  She will require admission.  Patient states that she would like to be full code.  Care of patient was signed out to oncoming ED provider. I ordered medication including prednisone and DuoNeb for COPD; Lasix for diuresis Reevaluation of the patient after these medicines showed that the patient improved I have reviewed the patients home medicines and have made adjustments as needed   Social Determinants of Health:  Lives independently  CRITICAL CARE Performed by: Godfrey Pick   Total critical care time: 45 minutes  Critical care time was exclusive of separately billable procedures and treating other patients.  Critical care was necessary to treat or prevent imminent or life-threatening deterioration.  Critical care was time spent personally by me on the following activities: development of treatment plan with patient and/or surrogate as well as nursing, discussions with consultants, evaluation of patient's response to treatment, examination of patient, obtaining history from patient or surrogate, ordering and performing treatments and interventions, ordering and review of laboratory studies, ordering and review of radiographic studies, pulse oximetry and re-evaluation of patient's condition.       Final Clinical Impression(s) / ED Diagnoses Final diagnoses:  Acute on chronic congestive heart failure, unspecified heart failure type ([place])  Acute respiratory  failure with hypoxia [facility])    Rx / DC Orders ED Discharge Orders     None         Godfrey Pick, MD 06/29/22 1557     Godfrey Pick, MD 06/30/22 [phone]

## 2022-06-29 NOTE — ED Notes (Signed)
[employer] rep called back and stated she would be here approximately 4pm; EDP made aware

## 2022-06-29 NOTE — Assessment & Plan Note (Addendum)
-  Patient reports chronically using 2 L nasal cannula supplementation just at nighttime; patient was requiring 2 L around-the-clock at time of admission to maintain saturation. -Continue to wean oxygen supplementation as tolerated (attempting no supplementation during the day); follow desaturation screening. -Short winded with activity; but almost able to speak in full sentences. -Patient reports improvement in her dry intermittent coughing spells. -Expiratory wheezing appreciated on exam.  Fair air movement bilaterally. -Continue treatment with his steroids (will transition to oral route), DuoNeb and continue treatment with Pulmicort/Brovana -Continue flutter valve and the use of mucolytic's. -Continue oral doxycycline with intention to treat for 5 days.

## 2022-06-29 NOTE — Assessment & Plan Note (Signed)
O2 sats

## 2022-06-29 NOTE — Assessment & Plan Note (Addendum)
-  Rate controlled on metoprolol, and amiodarone.   -Per cardiology notes, patient declined anticoagulation.   -Status post pacemaker implantation -Cardiology consulted; patient expressed that she was to force on exchange on pacemaker on 07/01/2022; will follow recommendations. -Follow electrolytes and keep as a goal potassium above 4 and magnesium above 2.

## 2022-06-29 NOTE — Assessment & Plan Note (Addendum)
-  On 2 L nightly. -Patient is currently requiring 2 L nasal cannula supplementation around-the-clock to maintain saturation -Appears to be secondary to acute on chronic CHF exacerbation along with COPD exacerbation -Continue treatment for conditions as described under problems; wean off oxygen supplementation as tolerated.

## 2022-06-29 NOTE — Assessment & Plan Note (Addendum)
-  Follows with Dr. Lovena Le.  Patient has appointment 07/01/2022 for procedure related to her pacemaker. -Cardiology has been consulted and will follow recommendations.

## 2022-06-29 NOTE — ED Triage Notes (Signed)
Pt brought in by [facility] for c/o sob; when ems arrived pt was found to be in the 70's with standing  Pt was put on [place] at 4L  When pt stood with ems her HR increased to 200's and her defibrillator did not fire

## 2022-06-29 NOTE — Assessment & Plan Note (Deleted)
Mildly decompensated.

## 2022-06-29 NOTE — ED Notes (Signed)
Pt 91-94% on room air. Placed on 2l for comfort.  States she wears 2l at night.

## 2022-06-29 NOTE — H&P (Signed)
History and Physical    [name]:[id number] DOB: 05-Jun-1936 DOA: 06/29/2022  PCP: Celene Squibb, MD   Patient coming from: Home  I have personally briefly reviewed patient's old medical records in [place]  Chief Complaint: Difficulty breathing  HPI: [name] is a 86 y.o. female with medical history significant for COPD with chronic respiratory failure, tachycardia-bradycardia syndrome status post pacemaker, atrial fibrillation, diastolic CHF, coronary artery disease, hypertensive urgency. Patient presented to the ED with complaints of difficulty breathing that started 3 days ago.  She also reported worsening dry cough.  And chest and epigastric discomfort.  On my evaluation, patient is on BiPAP, history is limited from patient, grand-daughter- April is at bedside and answers questions.  Patient is on 2 L of oxygen at night.  She lives alone and has no memory problems.  No leg swelling. EMS reports O2 sats were in the 90s on room air, was placed on 4 L.  ED Course: Temperature 98.1.  Heart rate 60s.  Respiratory rate initially 22-32, blood pressure 625W to 389 systolic.  O2 sats went to 97% on 2 L.  ABG shows pH of 7.3, pCO2 of 55.  Troponin 10 > 9.  Chest x-ray showed pulmonary vascular congestion and trace right pleural effusion, mild bibasilar atelectasis.  She was initially placed on BiPAP due to tachypnea. CTA chest no central or proximal segmental PE, trace bilateral right greater than left pleural effusions, 60 mg prednisone, DuoNebs and 40 mg of Lasix given.   Hospitalist to admit for COPD and likely CHF exacerbation.  Review of Systems: As per HPI all other systems reviewed and negative.  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    Essential hypertension    GERD (gastroesophageal reflux disease)    H/O hiatal hernia    Headache(784.0)    HOH (hard of hearing)    Hyperlipidemia    Neuromuscular disorder (HCC)    Tremors   Pericardial effusion    Hemorrhagic    Presence of permanent cardiac pacemaker    Tachycardia-bradycardia syndrome ([place])    s/p Medtronic Adapta L dual chamber device  5/10    Past Surgical History:  Procedure Laterality Date   APPENDECTOMY     BIOPSY  02/24/2022   Procedure: BIOPSY;  Surgeon: Irving Copas., MD;  Location: [facility];  Service: Gastroenterology;;   CARDIAC CATHETERIZATION  2010   stents x2.   CATARACT EXTRACTION     CHOLECYSTECTOMY     COLONOSCOPY  2011   COLONOSCOPY N/A 10/19/2014   Procedure: COLONOSCOPY;  Surgeon: Rogene Houston, MD;  [facility];  Service: Endoscopy;  Laterality: N/A;  1030   CORONARY STENT INTERVENTION N/A 05/28/2021   Procedure: CORONARY STENT INTERVENTION;  Surgeon: Leonie Man, MD;  Location: [facility];  Service: Cardiovascular;  Laterality: N/A;   ESOPHAGEAL DILATION N/A 05/02/2020   Procedure: ESOPHAGEAL DILATION;  Surgeon: Rogene Houston, MD;  [facility];  Service: Gastroenterology;  Laterality: N/A;   ESOPHAGOGASTRODUODENOSCOPY N/A 10/26/2014   Procedure: ESOPHAGOGASTRODUODENOSCOPY (EGD);  Surgeon: Rogene Houston, MD;  [facility];  Service: Endoscopy;  Laterality: N/A;  2 - Dr. has lunch and learn   ESOPHAGOGASTRODUODENOSCOPY N/A 02/24/2022   Procedure: ESOPHAGOGASTRODUODENOSCOPY (EGD);  Surgeon: Irving Copas., MD;  Location: [facility];  Service: Gastroenterology;  Laterality: N/A;   ESOPHAGOGASTRODUODENOSCOPY (EGD) WITH PROPOFOL N/A 05/02/2020   Procedure: ESOPHAGOGASTRODUODENOSCOPY (EGD) WITH PROPOFOL;  Surgeon: Rogene Houston, MD;  [facility];  Service: Gastroenterology;  Laterality: N/A;  250   Esophagogastroduodenoscopy  with esophageal dilation  2004, 2006, 2007   EUS N/A 02/24/2022   Procedure: UPPER ENDOSCOPIC ULTRASOUND (EUS) LINEAR;  Surgeon: Irving Copas., MD;  [facility]: [facility];  Service: Gastroenterology;  Laterality: N/A;   FINE NEEDLE ASPIRATION  02/24/2022   Procedure: FINE NEEDLE ASPIRATION;  Surgeon: Rush Landmark Telford Nab., MD;  Location: [facility];  Service: Gastroenterology;;  red path sent   GIVENS CAPSULE STUDY N/A 10/31/2014   Procedure: GIVENS CAPSULE STUDY;  Surgeon: Rogene Houston, MD;  [facility];  Service: Endoscopy;  Laterality: N/A;  730 -- pacemaker--needs monitoring--outpatient bed   INSERT / REPLACE / REMOVE PACEMAKER  2010   INTRAVASCULAR ULTRASOUND/IVUS N/A 05/28/2021   Procedure: Intravascular Ultrasound/IVUS;  Surgeon: Leonie Man, MD;  Location: [facility];  Service: Cardiovascular;  Laterality: N/A;   LEFT HEART CATH AND CORONARY ANGIOGRAPHY N/A 03/21/2021   Procedure: LEFT HEART CATH AND CORONARY ANGIOGRAPHY;  Surgeon: Belva Crome, MD;  Location: [facility];  Service: Cardiovascular;  Laterality: N/A;   LEFT HEART CATHETERIZATION WITH CORONARY ANGIOGRAM N/A 11/17/2011   Procedure: LEFT HEART CATHETERIZATION WITH CORONARY ANGIOGRAM;  Surgeon: Sherren Mocha, MD;  Location: [facility] CATH LAB;  Service: Cardiovascular;  Laterality: N/A;   LEFT HEART CATHETERIZATION WITH CORONARY ANGIOGRAM N/A 09/26/2014   Procedure: LEFT HEART CATHETERIZATION WITH CORONARY ANGIOGRAM;  Surgeon: Leonie Man, MD;  Location: [facility] CATH LAB;  Service: Cardiovascular;  Laterality: N/A;   Right rotator cuff repair     SAVORY DILATION N/A 02/24/2022   Procedure: SAVORY DILATION;  Surgeon: Rush Landmark Telford Nab., MD;  [facility];  Service: Gastroenterology;  Laterality: N/A;   SHOULDER ACROMIOPLASTY Right 05/30/2015   Procedure: RIGHT SHOULDER ACROMIOPLASTY;  Surgeon: Carole Civil, MD;  Location: [facility];  Service: Orthopedics;  Laterality:  Right;   SHOULDER OPEN ROTATOR CUFF REPAIR Right 05/30/2015   Procedure: OPEN ROTATOR CUFF REPAIR RIGHT SHOULDER;  Surgeon: Carole Civil, MD;  Location: [facility];  Service: Orthopedics;  Laterality: Right;   SHOULDER OPEN ROTATOR CUFF REPAIR Left 10/22/2016   Procedure: ROTATOR CUFF REPAIR SHOULDER OPEN;  Surgeon: Carole Civil, MD;  Location: [facility];  Service: Orthopedics;  Laterality: Left;   Subxiphoid pericardial window  11/10   VAGINAL HYSTERECTOMY       reports that she quit smoking about 33 years ago. Her smoking use included cigarettes. She has a 20.00 pack-year smoking history. She has been exposed to tobacco smoke. She has never used smokeless tobacco. She reports that she does not drink alcohol and does not use drugs.  Allergies  Allergen Reactions   Amitriptyline Hcl Other (See Comments)    Caused "jaws to twist and lock"   Plavix [Clopidogrel Bisulfate] Hives   Sulfonamide Derivatives Other (See Comments)    UNKNOWN REACTION    Family History  Problem Relation Age of Onset   Cancer Mother        Colon    Coronary artery disease Sister    Coronary artery disease Brother    Arthritis Other  Lung disease Other    Asthma Other     Prior to Admission medications   Medication Sig Start Date End Date Taking? Authorizing Provider  ALPRAZolam Duanne Moron) 1 MG tablet Take 1 mg by mouth 3 (three) times daily as needed for anxiety. 04/04/21   [provider]  amiodarone (PACERONE) 200 MG tablet TAKE 1 TABLET ONCE DAILY ON MONDAY THROUGH SATURDAY, NONE ON SUNDAY. 09/26/21   Evans Lance, MD  amLODipine (NORVASC) 10 MG tablet TAKE (1) TABLET BY MOUTH ONCE DAILY. 06/06/22   Satira Sark, MD  aspirin EC 81 MG tablet Take 1 tablet (81 mg total) by mouth daily. Swallow whole. 07/06/20   Evans Lance, MD  Cyanocobalamin (VITAMIN B-12) 5000 MCG SUBL Take 5,000 mcg by mouth daily.    [provider]  dicyclomine (BENTYL) 10 MG capsule TAKE (1) CAPSULE BY  MOUTH TWICE DAILY. 06/06/22   Harvel Quale, MD  folic acid (FOLVITE) 578 MCG tablet Take 400 mcg by mouth daily.    [provider]  furosemide (LASIX) 40 MG tablet Take 1 tablet (40 mg total) by mouth daily as needed for fluid or edema. 03/22/21   Florencia Reasons, MD  isosorbide mononitrate (IMDUR) 30 MG 24 hr tablet Take 2 tablets (60 mg total) by mouth daily. 03/17/22   Satira Sark, MD  JARDIANCE 10 MG TABS tablet Take 10 mg by mouth daily. 02/07/21   [provider]  metoprolol succinate (TOPROL-XL) 25 MG 24 hr tablet TAKE 3 TABLETS BY MOUTH IN THE MORNING AND 2 TABLETS IN THE EVENING. 04/14/22   Satira Sark, MD  Multiple Vitamin (MULTIVITAMIN WITH MINERALS) TABS tablet Take 1 tablet by mouth daily.     [provider]  nitroGLYCERIN (NITROSTAT) 0.4 MG SL tablet Place 1 tablet (0.4 mg total) under the tongue every 5 (five) minutes x 3 doses as needed for chest pain. 03/22/21   Florencia Reasons, MD  ondansetron (ZOFRAN) 4 MG tablet Take 4 mg by mouth every 6 (six) hours as needed for nausea or vomiting. 03/12/20   [provider]  OXYGEN Inhale 2 L into the lungs as needed (PRN as needed at night).    [provider]  pantoprazole (PROTONIX) 40 MG tablet TAKE (1) TABLET BY MOUTH ONCE DAILY. 03/17/22   Satira Sark, MD  potassium chloride SA (K-DUR,KLOR-CON) 20 MEQ tablet Take 20 mEq by mouth See admin instructions. Takes 1 tablet (20 meq) by mouth daily (scheduled) & may take a second dose if she has a second dose of Lasix prn per patient    Rexene Alberts, MD  primidone (MYSOLINE) 50 MG tablet Take 50 mg by mouth at bedtime. 10/31/10   [provider]  PROAIR HFA 108 (90 Base) MCG/ACT inhaler Inhale 1 puff into the lungs every 4 (four) hours as needed for wheezing or shortness of breath.  11/10/18   [provider]  ranolazine (RANEXA) 500 MG 12 hr tablet TAKE (1) TABLET BY MOUTH TWICE DAILY. 04/24/22   Isaiah Serge, NP   rosuvastatin (CRESTOR) 40 MG tablet Take 1 tablet (40 mg total) by mouth daily. Patient taking differently: Take 40 mg by mouth daily. Takes when she feels like it. 10/01/21   Satira Sark, MD  senna-docusate (SENOKOT-S) 8.6-50 MG tablet Take 1 tablet by mouth at bedtime. Patient taking differently: Take 1-2 tablets by mouth daily as needed (constipation.). 03/22/21   Florencia Reasons, MD  ticagrelor (BRILINTA) 60 MG TABS  tablet Take 1 tablet (60 mg total) by mouth 2 (two) times daily. 06/16/22   Satira Sark, MD  VITAMIN D, CHOLECALCIFEROL, PO Take 1 tablet by mouth daily.    [provider]  vitamin E 180 MG (400 UNITS) capsule Take 400 Units by mouth daily.    [provider]    Physical Exam: Vitals:   06/29/22 1300 06/29/22 1400 06/29/22 1500 06/29/22 1512  BP: (!) 155/85 (!) 190/93 (!) 173/86   Pulse: 66 66 68 69  Resp: (!) 24 (!) 30 (!) 32 (!) 22  Temp:      TempSrc:      SpO2: 95% 95% 95% 99%  Weight:      Height:        Constitutional: On BiPAP, becoming agitated trying to get mask off, trying to talk, Vitals:   06/29/22 1300 06/29/22 1400 06/29/22 1500 06/29/22 1512  BP: (!) 155/85 (!) 190/93 (!) 173/86   Pulse: 66 66 68 69  Resp: (!) 24 (!) 30 (!) 32 (!) 22  Temp:      TempSrc:      SpO2: 95% 95% 95% 99%  Weight:      Height:       Eyes: PERRL, lids and conjunctivae normal ENMT: Mucous membranes are moist.  Respiratory: On BiPAP, markedly reduced air entry bilaterally, barely any air movement, no wheezing or rhonchi, no crackles. . No accessory muscle use.  Cardiovascular: Regular rate and rhythm, no murmurs / rubs / gallops. No extremity edema.  Extremities warm Abdomen: no tenderness, no masses palpated. No hepatosplenomegaly. Bowel sounds positive.  Musculoskeletal: no clubbing / cyanosis. No joint deformity upper and lower extremities.  Skin: no rashes, lesions, ulcers. No induration Neurologic: Apparent cranial abnormality, moving  extremities spontaneously.  Psychiatric: Normal judgment and insight. Alert and oriented x 3. Normal mood.   Labs on Admission: I have personally reviewed following labs and imaging studies  CBC: Recent Labs  Lab 06/29/22 1145 06/29/22 1151  WBC 4.7  --   NEUTROABS 2.9  --   HGB 13.0 15.3*  HCT 42.2 45.0  MCV 97.0  --   PLT 190  --    Basic Metabolic Panel: Recent Labs  Lab 06/29/22 1145 06/29/22 1151  NA 140 141  K 4.0 4.1  CL 101 100  CO2 30  --   GLUCOSE 100* 97  BUN 8 7*  CREATININE 0.77 0.80  CALCIUM 9.4  --   MG 2.2  --    GFR: Estimated Creatinine Clearance: 47.1 mL/min (by C-G formula based on SCr of 0.8 mg/dL). Liver Function Tests: Recent Labs  Lab 06/29/22 1145  AST 28  ALT 18  ALKPHOS 87  BILITOT 0.5  PROT 8.0  ALBUMIN 4.0   Radiological Exams on Admission: DG Chest Port 1 View  Result Date: 06/29/2022 CLINICAL DATA:  Shortness of breath EXAM: PORTABLE CHEST 1 VIEW COMPARISON:  03/30/2021 radiograph and prior studies FINDINGS: Cardiomegaly and LEFT-sided pacemaker again noted. Mild pulmonary vascular congestion is noted. Trace RIGHT pleural effusion noted with mild bibasilar atelectasis. There is no evidence of pneumothorax or acute bony abnormality. IMPRESSION: Cardiomegaly with mild pulmonary vascular congestion, trace RIGHT pleural effusion and mild bibasilar atelectasis. Electronically Signed   By: Margarette Canada M.D.   On: 06/29/2022 11:47    EKG: Independently reviewed.  Atrial fibrillation rate 72, QTc prolonged 554.  Few P waves present.  Frequent PVCs.  Assessment/Plan Principal Problem:   COPD with acute exacerbation ([place])  Active Problems:   Chronic respiratory failure with hypoxia (HCC)   Acute on chronic diastolic CHF (congestive heart failure) (HCC)   Essential hypertension, benign   Coronary artery disease involving native heart with unstable angina pectoris (HCC)   Atrial fibrillation (HCC)   PPM-Medtronic   Prolonged QT  interval    Assessment and Plan: * COPD with acute exacerbation ([place]) Chest is tight with marked reduced air entry bilaterally.  Currently off BiPAP and sats 92% on room air. EMS reported sats in the 70s and was placed on 4 L.  She was transiently placed on BiPAP due to tachypnea.  Respiratory rate 22-32.  COVID test negative.  Chronic respiratory failure on 2 L at night.  CTA chest negative for PE.  She reports chest discomfort likely from COPD exacerbation.  Troponin x 2- 10> 9. -DuoNebs as needed and scheduled -Mucolytic's, flutter valve -Prednisone 60 mg x 1 given, will continue IV Solu-Medrol 60 twice daily -Doxycycline  Acute on chronic diastolic CHF (congestive heart failure) (HCC) Decompensated.  BNP elevated at 951 compared to baseline mid 200s.  Per chart weight gain, today 143, baseline over the past he has been in the 130s.  History of diastolic CHF, but not on diuretics.  Last echo 03/2021, EF of 55 to 60%, G2DD.  No significant peripheral signs of volume overload.  CTA chest showing trace bilateral right greater than left pleural effusions. -Obtain updated echo - IV Lasix 40 twice X 2 doses, further doses pending clinical course -Strict input output, daily weights, Daily BMP  Chronic respiratory failure with hypoxia (HCC) On 2 L nightly.  Prolonged QT interval QT 554.  On amiodarone. - K- 4, magnesium 2.2. -Continue amiodarone  PPM-Medtronic Follows with Dr. Lovena Le.  Patient has appointment 07/01/2022 for procedure related to her pacemaker.  Atrial fibrillation ([place]) Rate controlled on metoprolol, and amiodarone.  Per cardiology notes, patient declined anticoagulation.   -Resume metoprolol and amiodarone  Coronary artery disease involving native heart with unstable angina pectoris ([place]) Reports chest discomfort which is likely from COPD exacerbation.  Troponins 10 > 9.  EKG without significant ST or T wave changes.  History of multivessel CAD with PCI, shockwave  lithotripsy, DES X 2, in-stent restenosis-05/2021. Follows with Dr. Conni Elliot. -Resume Brilinta, aspirin, metoprolol, statin, ranolazine  Essential hypertension, benign Elevated, systolic 161W to 960A. -Resume Norvasc, Imdur, metoprolol,    DVT prophylaxis:  Lovenox Code Status: DNR- Confirmed with patient and grand-daughter April at bedside Family Communication: Granddaughter April at [employer], patient has no designated Economist.  Great grandsons also at bedside Disposition Plan: ~ 2days Consults called: None  Admission status: Inpt Tele I certify that at the point of admission it is my clinical judgment that the patient will require inpatient hospital care spanning beyond 2 midnights from the point of admission due to high intensity of service, high risk for further deterioration and high frequency of surveillance required.    Author: Bethena Roys, MD 06/29/2022 6:54 PM  For on call review [email or web address].

## 2022-06-29 NOTE — Assessment & Plan Note (Addendum)
-  QT 554.  On amiodarone chronically. -Follow electrolytes and replete as needed -Continue telemetry monitoring.

## 2022-06-29 NOTE — ED Notes (Signed)
Pt's daughter states she would like to be notified when she is transferred to room, her name is [name] and she may be reached at [phone]

## 2022-06-29 NOTE — Assessment & Plan Note (Addendum)
-  Daily weights, low-sodium and strict intake and output to be followed -Continue IV diuresis -Closely follow electrolytes and replete as needed -Follow updated ordered echo and recommendations by cardiology service.

## 2022-06-29 NOTE — Assessment & Plan Note (Addendum)
-  Stable -Continue current antihypertensive agent -IV diuresis will also help controlling blood pressure.

## 2022-06-30 ENCOUNTER — Other Ambulatory Visit: Payer: Self-pay

## 2022-06-30 ENCOUNTER — Other Ambulatory Visit (HOSPITAL_COMMUNITY): Payer: Self-pay | Admitting: *Deleted

## 2022-06-30 ENCOUNTER — Inpatient Hospital Stay (HOSPITAL_COMMUNITY): Payer: Medicare Other

## 2022-06-30 DIAGNOSIS — I5033 Acute on chronic diastolic (congestive) heart failure: Secondary | ICD-10-CM

## 2022-06-30 DIAGNOSIS — J441 Chronic obstructive pulmonary disease with (acute) exacerbation: Secondary | ICD-10-CM | POA: Diagnosis not present

## 2022-06-30 DIAGNOSIS — I10 Essential (primary) hypertension: Secondary | ICD-10-CM | POA: Diagnosis not present

## 2022-06-30 DIAGNOSIS — Z950 Presence of cardiac pacemaker: Secondary | ICD-10-CM | POA: Diagnosis not present

## 2022-06-30 LAB — BASIC METABOLIC PANEL
Anion gap: 8 (ref 5–15)
BUN: 13 mg/dL (ref 8–23)
CO2: 30 mmol/L (ref 22–32)
Calcium: 9.2 mg/dL (ref 8.9–10.3)
Chloride: 102 mmol/L (ref 98–111)
Creatinine, Ser: 0.82 mg/dL (ref 0.44–1.00)
GFR, Estimated: >60 mL/min (ref >60)
Glucose, Bld: 88 mg/dL (ref 70–99)
Potassium: 3.5 mmol/L (ref 3.5–5.1)
Sodium: 140 mmol/L (ref 135–145)

## 2022-06-30 LAB — ECHOCARDIOGRAM COMPLETE
AR max vel: 2.73 cm2
AV Area VTI: 2.87 cm2
AV Area mean vel: 2.5 cm2
AV Mean grad: 3 mmHg
AV Peak grad: 5.9 mmHg
Ao pk vel: 1.21 m/s
Area-P 1/2: 3.6 cm2
Calc EF: 68.3 %
Height: 65 in
MV VTI: 2.28 cm2
P 1/2 time: 631 msec
S' Lateral: 2.7 cm
Single Plane A2C EF: 74.2 %
Single Plane A4C EF: 63.7 %
Weight: 2165.8 oz

## 2022-06-30 LAB — CBC
HCT: 40.6 % (ref 36.0–46.0)
Hemoglobin: 12.9 g/dL (ref 12.0–15.0)
MCH: 30.2 pg (ref 26.0–34.0)
MCHC: 31.8 g/dL (ref 30.0–36.0)
MCV: 95.1 fL (ref 80.0–100.0)
Platelets: 193 10*3/uL (ref 150–400)
RBC: 4.27 MIL/uL (ref 3.87–5.11)
RDW: 14.6 % (ref 11.5–15.5)
WBC: 7.5 10*3/uL (ref 4.0–10.5)
nRBC: 0 % (ref 0.0–0.2)

## 2022-06-30 MED ORDER — PRIMIDONE 50 MG PO TABS
50.0000 mg | ORAL_TABLET | Freq: Every day | ORAL | Status: DC
  Administered 2022-06-30 – 2022-07-01 (×2): 50 mg via ORAL
  Filled 2022-06-30 (×2): qty 1

## 2022-06-30 MED ORDER — IPRATROPIUM-ALBUTEROL 0.5-2.5 (3) MG/3ML IN SOLN
3.0000 mL | Freq: Two times a day (BID) | RESPIRATORY_TRACT | Status: DC
  Administered 2022-06-30 – 2022-07-01 (×3): 3 mL via RESPIRATORY_TRACT
  Filled 2022-06-30 (×4): qty 3

## 2022-06-30 MED ORDER — IPRATROPIUM-ALBUTEROL 0.5-2.5 (3) MG/3ML IN SOLN
3.0000 mL | Freq: Four times a day (QID) | RESPIRATORY_TRACT | Status: DC
  Administered 2022-06-30: 3 mL via RESPIRATORY_TRACT
  Filled 2022-06-30: qty 3

## 2022-06-30 MED ORDER — ARFORMOTEROL TARTRATE 15 MCG/2ML IN NEBU
15.0000 ug | INHALATION_SOLUTION | Freq: Two times a day (BID) | RESPIRATORY_TRACT | Status: DC
  Administered 2022-06-30 – 2022-07-01 (×4): 15 ug via RESPIRATORY_TRACT
  Filled 2022-06-30 (×5): qty 2

## 2022-06-30 MED ORDER — ALPRAZOLAM 0.5 MG PO TABS
0.5000 mg | ORAL_TABLET | Freq: Three times a day (TID) | ORAL | Status: DC | PRN
  Administered 2022-06-30 – 2022-07-02 (×4): 0.5 mg via ORAL
  Filled 2022-06-30 (×5): qty 1

## 2022-06-30 MED ORDER — BUDESONIDE 0.5 MG/2ML IN SUSP
0.5000 mg | Freq: Two times a day (BID) | RESPIRATORY_TRACT | Status: DC
  Administered 2022-06-30 – 2022-07-01 (×4): 0.5 mg via RESPIRATORY_TRACT
  Filled 2022-06-30 (×5): qty 2

## 2022-06-30 NOTE — Progress Notes (Signed)
  Transition of Care [facility]) Screening Note   Patient Details  Name: [name] Date of Birth: 15-May-1936   Transition of Care [facility]) CM/SW Contact:    Iona Beard, [place] Phone Number: 06/30/2022, 12:16 PM    Transition of Care Department [facility]) has reviewed patient and no TOC needs have been identified at this time. We will continue to monitor patient advancement through interdisciplinary progression rounds. If new patient transition needs arise, please place a TOC consult.

## 2022-06-30 NOTE — Progress Notes (Signed)
Pt says she does want CPR if her heart stops. She wants to be a full code. She says, "I don't want them mashing on my stomach." I explained we don't push on the stomach, we push on the chest to start the heart beating again. She said, "I want them to try to do what they can for a couple of days and then see what they think can be done". On call MD notified.

## 2022-06-30 NOTE — Plan of Care (Signed)
  Problem: Education: Goal: Ability to demonstrate management of disease process will improve Outcome: Progressing Goal: Ability to verbalize understanding of medication therapies will improve Outcome: Progressing   Problem: Activity: Goal: Capacity to carry out activities will improve Outcome: Progressing   

## 2022-06-30 NOTE — Progress Notes (Signed)
  Echocardiogram 2D Echocardiogram has been performed.  [name] 06/30/2022, 8:57 AM

## 2022-06-30 NOTE — Progress Notes (Signed)
Progress Note   Patient: [name] RXV:[id number] DOB: [birth year] DOA: 06/29/2022     1 DOS: the patient was seen and examined on 06/30/2022   Brief hospital admission course: As per H&P written by Dr. Denton Brick on 06/29/2022 [name] is a 86 y.o. female with medical history significant for COPD with chronic respiratory failure, tachycardia-bradycardia syndrome status post pacemaker, atrial fibrillation, diastolic CHF, coronary artery disease, hypertensive urgency. Patient presented to the ED with complaints of difficulty breathing that started 3 days ago.  She also reported worsening dry cough.  And chest and epigastric discomfort.  On my evaluation, patient is on BiPAP, history is limited from patient, grand-daughter- [name] is at bedside and answers questions.  Patient is on 2 L of oxygen at night.  She lives alone and has no memory problems.  No leg swelling. EMS reports O2 sats were in the 90s on room air, was placed on 4 L.   ED Course: Temperature 98.1.  Heart rate 60s.  Respiratory rate initially 22-32, blood pressure 509T to 267 systolic.  O2 sats went to 97% on 2 L.  ABG shows pH of 7.3, pCO2 of 55.  Troponin 10 > 9.  Chest x-ray showed pulmonary vascular congestion and trace right pleural effusion, mild bibasilar atelectasis.  She was initially placed on BiPAP due to tachypnea. CTA chest no central or proximal segmental PE, trace bilateral right greater than left pleural effusions, 60 mg prednisone, DuoNebs and 40 mg of Lasix given.   Hospitalist to admit for COPD and likely CHF exacerbation.  Assessment and Plan: * COPD with acute exacerbation ([place]) -Patient reports chronically using 2 L nasal cannula supplementation just at nighttime; at this moment requiring 2 L around-the-clock. -Short winded with minimal activity and having difficulty speaking in full sentences. -Patient reports dry intermittent coughing spells -Expiratory wheezing appreciated on exam.  Fair air movement  bilaterally. -Continue treatment with his steroids, DuoNeb and start Pulmicort/Brovana -Continue flutter valve and the use of mucolytic's. -Continue oral doxycycline with intention to treat for 5 days.  Acute on chronic diastolic CHF (congestive heart failure) ([place]) -Daily weights, low-sodium and strict intake and output to be followed -Continue IV diuresis -Closely follow electrolytes and replete as needed -Follow updated ordered echo and recommendations by cardiology service.  Chronic respiratory failure with hypoxia (HCC) -On 2 L nightly. -Patient is currently requiring 2 L nasal cannula supplementation around-the-clock to maintain saturation -Appears to be secondary to acute on chronic CHF exacerbation along with COPD exacerbation -Continue treatment for conditions as described under problems; wean off oxygen supplementation as tolerated.  Prolonged QT interval -QT 554.  On amiodarone chronically. -Follow electrolytes and replete as needed -Continue telemetry monitoring.  PPM-[employer]tr[employer] -Follows with Dr. Lovena Le.  Patient has appointment 07/01/2022 for procedure related to her pacemaker. -Cardiology has been consulted and will follow recommendations.  Atrial fibrillation (HCC) -Rate controlled on metoprolol, and amiodarone.   -Per cardiology notes, patient declined anticoagulation.   -Status post pacemaker implantation -Cardiology consulted; patient expressed that she was to force on exchange on pacemaker on 07/01/2022; will follow recommendations. -Follow electrolytes and keep as a goal potassium above 4 and magnesium above 2.  Coronary artery disease involving native heart with unstable angina pectoris ([place]) -Reports chest discomfort which is likely from COPD exacerbation.   -Troponins 10 >>9.   -EKG and telemetry without significant changes suggesting ischemia.   -Resume Brilinta, aspirin, metoprolol, statin and the use of ranolazine  Essential hypertension,  benign -Stable -Continue  current antihypertensive agent -IV diuresis will also help controlling blood pressure.   Subjective:  Afebrile, no chest pain, no nausea, no vomiting.  Patient is reporting shortness of breath with exertion and intermittent dry coughing spells.  On examination having difficulty speaking in full sentences.  Physical Exam: Vitals:   06/30/22 0547 06/30/22 0722 06/30/22 1024 06/30/22 1552  BP: 127/82   137/85  Pulse: 65   79  Resp: 18   (!) 21  Temp: 97.8 F (36.6 C)   98 F (36.7 C)  TempSrc: Oral   Oral  SpO2: 97% 95% 98% 94%  Weight: 61.4 kg     Height:       General exam: Alert, awake, oriented x 3; demonstrating mild difficulty speaking in full sentences.  Hard of hearing.  Denying chest pain. Respiratory system: Positive expiratory wheezing; fair air movement.  Fine crackles at the bases.  No using accessory muscle.  2 L nasal cannula supplementation in place Cardiovascular system:RRR. No rubs or gallops. Gastrointestinal system: Abdomen is nondistended, soft and nontender. No organomegaly or masses felt. Normal bowel sounds heard. Central nervous system: Alert and oriented. No focal neurological deficits. Extremities: No cyanosis or clubbing; trace edema appreciated bilaterally. Skin: No petechiae. Psychiatry: Judgement and insight appear normal.  Patient was anxious on examination; no suicidal ideation or hallucinations present.  Data Reviewed: Basic metabolic panel: Sodium 027, potassium 3.5, chloride 102, bicarb 30, BUN 13, creatinine 0.82 and GFR more than 60 CBC: WBC 7.5, hemoglobin 12.9 and platelet count 193 K  Family Communication: No family at bedside.  Disposition: Status is: Inpatient Remains inpatient appropriate because: Continue treatment for acute on chronic heart failure exacerbation and COPD.  Patient receiving IV steroids and IV diuresis.   Planned Discharge Destination: Home  Time spent: 35 minutes  Author: Barton Dubois,  MD 06/30/2022 6:10 PM  For on call review [email or web address].

## 2022-07-01 ENCOUNTER — Encounter: Payer: Medicare Other | Admitting: Internal Medicine

## 2022-07-01 DIAGNOSIS — I5033 Acute on chronic diastolic (congestive) heart failure: Secondary | ICD-10-CM | POA: Diagnosis not present

## 2022-07-01 DIAGNOSIS — I10 Essential (primary) hypertension: Secondary | ICD-10-CM | POA: Diagnosis not present

## 2022-07-01 DIAGNOSIS — Z950 Presence of cardiac pacemaker: Secondary | ICD-10-CM | POA: Diagnosis not present

## 2022-07-01 DIAGNOSIS — J441 Chronic obstructive pulmonary disease with (acute) exacerbation: Secondary | ICD-10-CM | POA: Diagnosis not present

## 2022-07-01 LAB — BASIC METABOLIC PANEL
Anion gap: 8 (ref 5–15)
BUN: 19 mg/dL (ref 8–23)
CO2: 29 mmol/L (ref 22–32)
Calcium: 9.8 mg/dL (ref 8.9–10.3)
Chloride: 102 mmol/L (ref 98–111)
Creatinine, Ser: 0.84 mg/dL (ref 0.44–1.00)
GFR, Estimated: >60 mL/min (ref >60)
Glucose, Bld: 119 mg/dL — ABNORMAL HIGH (ref 70–99)
Potassium: 3.4 mmol/L — ABNORMAL LOW (ref 3.5–5.1)
Sodium: 139 mmol/L (ref 135–145)

## 2022-07-01 LAB — GLUCOSE, CAPILLARY: Glucose-Capillary: 197 mg/dL — ABNORMAL HIGH (ref 70–99)

## 2022-07-01 LAB — MAGNESIUM: Magnesium: 2.2 mg/dL (ref 1.7–2.4)

## 2022-07-01 MED ORDER — POTASSIUM CHLORIDE CRYS ER 20 MEQ PO TBCR
40.0000 meq | EXTENDED_RELEASE_TABLET | Freq: Once | ORAL | Status: AC
  Administered 2022-07-01: 40 meq via ORAL
  Filled 2022-07-01: qty 2

## 2022-07-01 NOTE — Progress Notes (Signed)
Patient stated she does not take all blood pressure and heart medication at the same time due to blood pressure dropping often. Patients blood pressure at the time was 143/74, patient wanted to wait to take Norvasc 10 mg. Come back an rechecked BP prior to giving medication blood pressure was 107/64. MD Dyann Kief made aware. New order hold medication due to blood pressure.

## 2022-07-01 NOTE — Progress Notes (Signed)
Progress Note   Patient: [name] [id number]:[id number] DOB: Jan 20, 1936 DOA: 06/29/2022     2 DOS: the patient was seen and examined on 07/01/2022   Brief hospital admission course: As per H&P written by Dr. Denton Brick on 06/29/2022 [name] is a 86 y.o. female with medical history significant for COPD with chronic respiratory failure, tachycardia-bradycardia syndrome status post pacemaker, atrial fibrillation, diastolic CHF, coronary artery disease, hypertensive urgency. Patient presented to the ED with complaints of difficulty breathing that started 3 days ago.  She also reported worsening dry cough.  And chest and epigastric discomfort.  On my evaluation, patient is on BiPAP, history is limited from patient, grand-daughter- April is at bedside and answers questions.  Patient is on 2 L of oxygen at night.  She lives alone and has no memory problems.  No leg swelling. EMS reports O2 sats were in the 90s on room air, was placed on 4 L.   ED Course: Temperature 98.1.  Heart rate 60s.  Respiratory rate initially 22-32, blood pressure 081K to 481 systolic.  O2 sats went to 97% on 2 L.  ABG shows pH of 7.3, pCO2 of 55.  Troponin 10 > 9.  Chest x-ray showed pulmonary vascular congestion and trace right pleural effusion, mild bibasilar atelectasis.  She was initially placed on BiPAP due to tachypnea. CTA chest no central or proximal segmental PE, trace bilateral right greater than left pleural effusions, 60 mg prednisone, DuoNebs and 40 mg of Lasix given.   Hospitalist to admit for COPD and likely CHF exacerbation.  Assessment and Plan: * COPD with acute exacerbation ([place]) -Patient reports chronically using 2 L nasal cannula supplementation just at nighttime; patient was requiring 2 L around-the-clock at time of admission to maintain saturation. -Continue to wean oxygen supplementation as tolerated (attempting no supplementation during the day); follow desaturation screening. -Short winded with  activity; but almost able to speak in full sentences. -Patient reports improvement in her dry intermittent coughing spells. -Expiratory wheezing appreciated on exam.  Fair air movement bilaterally. -Continue treatment with his steroids (will transition to oral route), DuoNeb and continue treatment with Pulmicort/Brovana -Continue flutter valve and the use of mucolytic's. -Continue oral doxycycline with intention to treat for 5 days.  Acute on chronic diastolic CHF (congestive heart failure) ([place]) -Daily weights, low-sodium and strict intake and output to be followed -Continue IV diuresis -Closely follow electrolytes and replete as needed -Follow updated ordered echo and recommendations by cardiology service.  Chronic respiratory failure with hypoxia (HCC) -On 2 L nightly. -Patient is currently requiring 2 L nasal cannula supplementation around-the-clock to maintain saturation -Appears to be secondary to acute on chronic CHF exacerbation along with COPD exacerbation -Continue treatment for conditions as described under problems; wean off oxygen supplementation as tolerated.  Prolonged QT interval -QT 554.  On amiodarone chronically. -Follow electrolytes and replete as needed -Continue telemetry monitoring.  PPM-[employer]tr[employer] -Follows with Dr. Lovena Le.  Patient has appointment 07/01/2022 for procedure related to her pacemaker. -Cardiology has been consulted and will follow recommendations.  Atrial fibrillation (HCC) -Rate controlled on metoprolol, and amiodarone.   -Per cardiology notes, patient declined anticoagulation.   -Status post pacemaker implantation -Cardiology consulted; patient expressed that she was to force on exchange on pacemaker on 07/01/2022; will follow recommendations. -Follow electrolytes and keep as a goal potassium above 4 and magnesium above 2.  Coronary artery disease involving native heart with unstable angina pectoris ([place]) -Reports chest discomfort which is  likely from COPD exacerbation.   -  Troponins 10 >>9.   -EKG and telemetry without significant changes suggesting ischemia.   -Resume Brilinta, aspirin, metoprolol, statin and the use of ranolazine  Essential hypertension, benign -Stable -Continue current antihypertensive agent -IV diuresis will also help controlling blood pressure.   Subjective:  Reporting intermittent coughing spells; able to speak in almost complete full sentences and denies orthopnea.  Improved from movement bilaterally.  No overnight events.  Good urine output appreciated.  Patient is afebrile and at time of evaluation was on room air so far well-tolerated.  Physical Exam: Vitals:   07/01/22 0707 07/01/22 0902 07/01/22 1016 07/01/22 1406  BP:  (!) 143/74 107/64 115/68  Pulse:  77 65 69  Resp:    20  Temp:    98 F (36.7 C)  TempSrc:    Oral  SpO2: 95%   96%  Weight:      Height:       General exam: Alert, awake, oriented x 3; reports no chest pain, no nausea or vomiting.  At time of evaluation attempting to wean patient off to room air.  Patient demonstrating improvement in her capacity to speak in almost complete full sentences and denies orthopnea. Respiratory system: Decreased breath sounds at the bases; no frank crackles.  Improved air movement and just mild expiratory wheezing on exam.  No using accessory muscles. Cardiovascular system:Rate controlled. No rubs or gallops. Gastrointestinal system: Abdomen is nondistended, soft and nontender. No organomegaly or masses felt. Normal bowel sounds heard. Central nervous system: Alert and oriented. No focal neurological deficits. Extremities: No cyanosis or clubbing; no edema. Skin: No petechiae. Psychiatry: Judgement and insight appear normal. Mood & affect appropriate.   Data Reviewed: Basic metabolic panel: Sodium 657, potassium 3.4, chloride 102, bicarb 29, BUN 19, creatinine 0.84, GFR more than 60. CBC: WBC 7.5, hemoglobin 12.9 and platelet count 193  K Magnesium: 2.2  Family Communication: No family at bedside.  Disposition: Status is: Inpatient Remains inpatient appropriate because: Continue treatment for acute on chronic heart failure exacerbation and COPD.  Patient receiving IV steroids and IV diuresis.   Planned Discharge Destination: Home  Time spent: 35 minutes  Author: Barton Dubois, MD 07/01/2022 5:44 PM  For on call review [email or web address].

## 2022-07-02 ENCOUNTER — Encounter: Payer: Self-pay | Admitting: Internal Medicine

## 2022-07-02 LAB — BASIC METABOLIC PANEL
Anion gap: 8 (ref 5–15)
BUN: 25 mg/dL — ABNORMAL HIGH (ref 8–23)
CO2: 29 mmol/L (ref 22–32)
Calcium: 9.6 mg/dL (ref 8.9–10.3)
Chloride: 100 mmol/L (ref 98–111)
Creatinine, Ser: 0.83 mg/dL (ref 0.44–1.00)
GFR, Estimated: >60 mL/min (ref >60)
Glucose, Bld: 107 mg/dL — ABNORMAL HIGH (ref 70–99)
Potassium: 4.2 mmol/L (ref 3.5–5.1)
Sodium: 137 mmol/L (ref 135–145)

## 2022-07-02 MED ORDER — ISOSORBIDE MONONITRATE ER 30 MG PO TB24: 30.0000 mg | ORAL_TABLET | Freq: Every day | ORAL | 5 refills | Status: DC

## 2022-07-02 MED ORDER — AMLODIPINE BESYLATE 5 MG PO TABS: 5.0000 mg | ORAL_TABLET | Freq: Every day | ORAL | 4 refills | Status: DC

## 2022-07-02 MED ORDER — METOPROLOL SUCCINATE ER 50 MG PO TB24: 50.0000 mg | ORAL_TABLET | Freq: Two times a day (BID) | ORAL | 4 refills | Status: DC

## 2022-07-02 MED ORDER — LEVALBUTEROL HCL 0.63 MG/3ML IN NEBU: 0.6300 mg | INHALATION_SOLUTION | Freq: Two times a day (BID) | RESPIRATORY_TRACT | Status: DC

## 2022-07-02 MED ORDER — ROSUVASTATIN CALCIUM 40 MG PO TABS: 40.0000 mg | ORAL_TABLET | Freq: Every day | ORAL | 3 refills | Status: DC

## 2022-07-02 MED ORDER — PREDNISONE 20 MG PO TABS: 40.0000 mg | ORAL_TABLET | Freq: Every day | ORAL | 0 refills | Status: AC

## 2022-07-02 MED ORDER — POTASSIUM CHLORIDE ER 10 MEQ PO TBCR: 10.0000 meq | EXTENDED_RELEASE_TABLET | Freq: Every day | ORAL | 2 refills | Status: DC

## 2022-07-02 MED ORDER — ASPIRIN 81 MG PO TBEC: 81.0000 mg | DELAYED_RELEASE_TABLET | Freq: Every day | ORAL | 12 refills | Status: AC

## 2022-07-02 MED ORDER — DOXYCYCLINE HYCLATE 100 MG PO TABS: 100.0000 mg | ORAL_TABLET | Freq: Two times a day (BID) | ORAL | 0 refills | Status: AC

## 2022-07-02 MED ORDER — MELATONIN 3 MG PO TABS
6.0000 mg | ORAL_TABLET | Freq: Once | ORAL | Status: AC
  Administered 2022-07-02: 6 mg via ORAL
  Filled 2022-07-02: qty 2

## 2022-07-02 MED ORDER — TICAGRELOR 60 MG PO TABS: 60.0000 mg | ORAL_TABLET | Freq: Two times a day (BID) | ORAL | 6 refills | Status: DC

## 2022-07-02 MED ORDER — RANOLAZINE ER 500 MG PO TB12: 500.0000 mg | ORAL_TABLET | Freq: Two times a day (BID) | ORAL | 8 refills | Status: AC

## 2022-07-02 MED ORDER — LIVING BETTER WITH HEART FAILURE BOOK: Freq: Once | Status: AC

## 2022-07-02 MED ORDER — ALBUTEROL SULFATE (2.5 MG/3ML) 0.083% IN NEBU: 2.5000 mg | INHALATION_SOLUTION | RESPIRATORY_TRACT | 2 refills | Status: DC | PRN

## 2022-07-02 NOTE — Progress Notes (Signed)
    Notified by the admitting team that the patient missed her appointment with Dr. Lovena Le on 07/01/2022 to arrange for generator change due to being admitted. I reached out to our EP team and she reached [facility] on 05/30/2022 and has 3 months from this to have her generator changed. They have assisted with rescheduling her visit with Dr. Lovena Le and this will be on 07/15/2022 at 9:45 AM. Will add this to her AVS.   Signed, Erma Heritage, PA-C 07/02/2022, 10:29 AM

## 2022-07-02 NOTE — Progress Notes (Signed)
Patient uses home oxygen at home during the night. Oxygen saturation while sitting off side of bed was 93% at Room air, while ambulating patients oxygen saturation lowered to 88% on Room air. Patient reported some complaints of shortness of breath. Noted patient gait unsteady. MD Courage made aware.

## 2022-07-02 NOTE — Progress Notes (Signed)
Patient discharged home today, transported home by family. Discharge paperwork went over with patient and granddaughter, both verbalized understanding. Belongings sent home with patient.

## 2022-07-02 NOTE — Care Management Important Message (Signed)
Important Message  Patient Details  Name: CAMBER NINH MRN: [id number] Date of Birth: April 16, 1936   Medicare Important Message Given:  Yes     Tommy Medal 07/02/2022, 11:30 AM

## 2022-07-02 NOTE — TOC Transition Note (Signed)
Transition of Care [facility]) - CM/SW Discharge Note   Patient Details  Name: MERLINA MARCHENA MRN: [id number] Date of Birth: 1936/01/08  Transition of Care [facility]) CM/SW Contact:  Boneta Lucks, RN Phone Number: 07/02/2022, 12:35 PM   Clinical Narrative:   Patient discharging home, needing home health. MD ordering HHRN/PT. Marjory Lies with [facility] accepted the referral. Added to AVS.     Final next level of care: [place] Barriers to Discharge: Barriers Resolved   Patient Goals and CMS Choice Patient states their goals for this hospitalization and ongoing recovery are:: to go home. CMS Medicare.gov Compare Post Acute Care list provided to:: Patient Choice offered to / list presented to : Patient  Discharge Placement               Patient and family notified of of transfer: 07/02/22  Discharge Plan and Services              HH Arranged: RN, PT [facility] Agency: [place] Date [place]: 07/02/22 Time [place]: 1233 Representative spoke with at [place]: Marjory Lies  Readmission Risk Interventions    07/02/2022   12:34 PM  Readmission Risk Prevention Plan  Transportation Screening Complete  PCP or Specialist Appt within 5-7 Days Complete  Home Care Screening Complete  Medication Review (RN CM) Complete

## 2022-07-02 NOTE — Discharge Instructions (Signed)
1)Please  note that there has been several changes to your medications 2)Avoid ibuprofen/Advil/Aleve/Motrin/Goody Powders/Naproxen/BC powders/Meloxicam/Diclofenac/Indomethacin and other Nonsteroidal anti-inflammatory medications as these will make you more likely to bleed and can cause stomach ulcers, can also cause Kidney problems.  3)follow up with Dr. Lovena Le for pacemaker battery check

## 2022-07-02 NOTE — Discharge Summary (Signed)
FRANCHELLE FOSKETT, is a 86 y.o. female  DOB 1935/09/24  MRN [id number].  Admission date:  06/29/2022  Admitting Physician  Bethena Roys, MD  Discharge Date:  07/02/2022   Primary MD  Celene Squibb, MD  Recommendations for primary care physician for things to follow:   1)Please  note that there has been several changes to your medications 2)Avoid ibuprofen/Advil/Aleve/Motrin/Goody Powders/Naproxen/BC powders/Meloxicam/Diclofenac/Indomethacin and other Nonsteroidal anti-inflammatory medications as these will make you more likely to bleed and can cause stomach ulcers, can also cause Kidney problems.  3)follow up with Dr. Lovena Le for pacemaker battery check  Admission Diagnosis  Acute respiratory failure with hypoxia ([place]) [J96.01] COPD with acute exacerbation ([place]) [J44.1] Acute on chronic congestive heart failure, unspecified heart failure type ([place]) [I50.9]   Discharge Diagnosis  Acute respiratory failure with hypoxia ([place]) [J96.01] COPD with acute exacerbation (HCC) [J44.1] Acute on chronic congestive heart failure, unspecified heart failure type ([place]) [I50.9]    Principal Problem:   COPD with acute exacerbation ([place]) Active Problems:   Chronic respiratory failure with hypoxia (HCC)   Acute on chronic diastolic CHF (congestive heart failure) (HCC)   Essential hypertension, benign   Coronary artery disease involving native heart with unstable angina pectoris (HCC)   Atrial fibrillation (HCC)   PPM-Medtronic   Prolonged QT interval      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[place])    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    Essential hypertension    GERD (gastroesophageal reflux  disease)    H/O hiatal hernia    Headache(784.0)    HOH (hard of hearing)    Hyperlipidemia    Neuromuscular disorder (HCC)    Tremors   Pericardial effusion    Hemorrhagic    Presence of permanent cardiac pacemaker    Tachycardia-bradycardia syndrome ([place])    s/p Medtronic Adapta L dual chamber device  5/10    Past Surgical History:  Procedure Laterality Date   APPENDECTOMY     BIOPSY  02/24/2022   Procedure: BIOPSY;  Surgeon: Irving Copas., MD;  Location: [facility];  Service: Gastroenterology;;   CARDIAC CATHETERIZATION  2010   stents x2.   CATARACT EXTRACTION     CHOLECYSTECTOMY     COLONOSCOPY  2011   COLONOSCOPY N/A 10/19/2014   Procedure: COLONOSCOPY;  Surgeon: Rogene Houston, MD;  [facility];  Service: Endoscopy;  Laterality: N/A;  1030   CORONARY STENT INTERVENTION N/A 05/28/2021   Procedure: CORONARY STENT INTERVENTION;  Surgeon: Leonie Man, MD;  Location: [place] [facility];  Service: Cardiovascular;  Laterality: N/A;   ESOPHAGEAL DILATION N/A 05/02/2020   Procedure: ESOPHAGEAL DILATION;  Surgeon: Rogene Houston, MD;  [facility];  Service: Gastroenterology;  Laterality: N/A;   ESOPHAGOGASTRODUODENOSCOPY N/A 10/26/2014   Procedure: ESOPHAGOGASTRODUODENOSCOPY (EGD);  Surgeon: Rogene Houston, MD;  [facility];  Service: Endoscopy;  Laterality: N/A;  32 - Dr. has lunch and learn   ESOPHAGOGASTRODUODENOSCOPY N/A 02/24/2022   Procedure: ESOPHAGOGASTRODUODENOSCOPY (EGD);  Surgeon: Irving Copas., MD;  Location: [facility];  Service: Gastroenterology;  Laterality: N/A;   ESOPHAGOGASTRODUODENOSCOPY (EGD) WITH PROPOFOL N/A 05/02/2020   Procedure: ESOPHAGOGASTRODUODENOSCOPY (EGD) WITH PROPOFOL;  Surgeon: Rogene Houston, MD;  [facility];  Service: Gastroenterology;  Laterality: N/A;  250   Esophagogastroduodenoscopy with esophageal dilation  2004, 2006, 2007   EUS N/A 02/24/2022   Procedure: UPPER  ENDOSCOPIC ULTRASOUND (EUS) LINEAR;  Surgeon: Irving Copas., MD;  [facility];  Service: Gastroenterology;  Laterality: N/A;   FINE NEEDLE ASPIRATION  02/24/2022   Procedure: FINE NEEDLE ASPIRATION;  Surgeon: Rush Landmark Telford Nab., MD;  Location: [facility];  Service: Gastroenterology;;  red path sent   GIVENS CAPSULE STUDY N/A 10/31/2014   Procedure: GIVENS CAPSULE STUDY;  Surgeon: Rogene Houston, MD;  [facility];  Service: Endoscopy;  Laterality: N/A;  730 -- pacemaker--needs monitoring--outpatient bed   INSERT / REPLACE / REMOVE PACEMAKER  2010   INTRAVASCULAR ULTRASOUND/IVUS N/A 05/28/2021   Procedure: Intravascular Ultrasound/IVUS;  Surgeon: Leonie Man, MD;  Location: [place] [facility];  Service: Cardiovascular;  Laterality: N/A;   LEFT HEART CATH AND CORONARY ANGIOGRAPHY N/A 03/21/2021   Procedure: LEFT HEART CATH AND CORONARY ANGIOGRAPHY;  Surgeon: Belva Crome, MD;  Location: [place] [facility];  Service: Cardiovascular;  Laterality: N/A;   LEFT HEART CATHETERIZATION WITH CORONARY ANGIOGRAM N/A 11/17/2011   Procedure: LEFT HEART CATHETERIZATION WITH CORONARY ANGIOGRAM;  Surgeon: Sherren Mocha, MD;  Location: [facility];  Service: Cardiovascular;  Laterality: N/A;   LEFT HEART CATHETERIZATION WITH CORONARY ANGIOGRAM N/A 09/26/2014   Procedure: LEFT HEART CATHETERIZATION WITH CORONARY ANGIOGRAM;  Surgeon: Leonie Man, MD;  Location: [facility] CATH LAB;  Service: Cardiovascular;  Laterality: N/A;   Right rotator cuff repair     SAVORY DILATION N/A 02/24/2022   Procedure: SAVORY DILATION;  Surgeon: Rush Landmark Telford Nab., MD;  Location: [facility];  Service: Gastroenterology;  Laterality: N/A;   SHOULDER ACROMIOPLASTY Right 05/30/2015   Procedure: RIGHT SHOULDER ACROMIOPLASTY;  Surgeon: Carole Civil, MD;  Location: [facility];  Service: Orthopedics;  Laterality: Right;   SHOULDER OPEN ROTATOR CUFF REPAIR Right 05/30/2015   Procedure: OPEN ROTATOR  CUFF REPAIR RIGHT SHOULDER;  Surgeon: Carole Civil, MD;  Location: [facility];  Service: Orthopedics;  Laterality: Right;   SHOULDER OPEN ROTATOR CUFF REPAIR Left 10/22/2016   Procedure: ROTATOR CUFF REPAIR SHOULDER OPEN;  Surgeon: Carole Civil, MD;  Location: [facility];  Service: Orthopedics;  Laterality: Left;   Subxiphoid pericardial window  11/10   VAGINAL HYSTERECTOMY       HPI  from the history and physical done on the day of admission:    Chief Complaint: Difficulty breathing   HPI: EIRA ALPERT is a 86 y.o. female with medical history significant for COPD with chronic respiratory failure, tachycardia-bradycardia syndrome status post pacemaker, atrial fibrillation, diastolic CHF, coronary artery disease, hypertensive urgency. Patient presented to the ED with complaints of difficulty breathing that started 3 days ago.  She also reported worsening dry cough.  And chest and epigastric discomfort.  On my evaluation, patient is on BiPAP, history is limited from patient, grand-daughter- April is at bedside and  answers questions.  Patient is on 2 L of oxygen at night.  She lives alone and has no memory problems.  No leg swelling. EMS reports O2 sats were in the 90s on room air, was placed on 4 L.   ED Course: Temperature 98.1.  Heart rate 60s.  Respiratory rate initially 22-32, blood pressure 973Z to 329 systolic.  O2 sats went to 97% on 2 L.  ABG shows pH of 7.3, pCO2 of 55.  Troponin 10 > 9.  Chest x-ray showed pulmonary vascular congestion and trace right pleural effusion, mild bibasilar atelectasis.  She was initially placed on BiPAP due to tachypnea. CTA chest no central or proximal segmental PE, trace bilateral right greater than left pleural effusions, 60 mg prednisone, DuoNebs and 40 mg of Lasix given.   Hospitalist to admit for COPD and likely CHF exacerbation.   Review of Systems: As per HPI all other systems reviewed and negative.     Hospital Course:    COPD with acute  exacerbation ([place]) -Patient reports chronically using 2 L nasal cannula supplementation just at nighttime; - patient was requiring 2 L around-the-clock at time of admission to maintain saturation. Treated with antibiotics and bronchodilators and mucolytics. -Much improved overall oxygen requirement is back to baseline -   Acute on chronic diastolic CHF (congestive heart failure) ([place]) Much improved after IV diuresis -Echo from 06/30/2022 with EF of 55 to 60%, no regional wall motion abnormalities -Cardiology input appreciated   Chronic respiratory failure with hypoxia ([employer]) -Prior to admission at baseline patient only was on  2 L nightly. - on admission patient had higher oxygen requirement than baseline -Appears to be secondary to acute on chronic CHF exacerbation along with COPD exacerbation -With treatment of CHF and COPD oxygen requirement is now back to baseline.   PPM-Medtronic -Follows with Dr. Lovena Le.  -Cardiology team as been contacted to reschedule patient's appointment with Dr. Lovena Le to change her pacemaker battery   Atrial fibrillation ([place]) -Rate controlled on metoprolol, and amiodarone.   -Per cardiology notes, patient declined anticoagulation.   -Status post pacemaker implantation -Please see above for timing of pacemaker battery change   Coronary artery disease involving native heart with unstable angina pectoris ([place]) -Ruled out for ACS by EKG and cardiac enzymes -Remains chest pain-free Continue Brilinta, aspirin, metoprolol, statin and the use of ranolazine   Essential hypertension, benign -Stable -Resume PTA meds   Discharge Condition: Stable  Follow UP   Follow-up Information     Evans Lance, MD Follow up on 07/15/2022.   Specialty: Cardiology Why: Cardiology Follow-up on 07/15/2022 at 9:45 AM. Contact information: [place] [id number] [phone]                Diet and Activity recommendation:  As  advised  Discharge Instructions    Discharge Instructions     Call MD for:  difficulty breathing, headache or visual disturbances   Complete by: As directed    Call MD for:  persistant dizziness or light-headedness   Complete by: As directed    Call MD for:  persistant nausea and vomiting   Complete by: As directed    Call MD for:  temperature >100.4   Complete by: As directed    Diet - low sodium heart healthy   Complete by: As directed    Discharge instructions   Complete by: As directed    1)Please  note that there has been several changes to your medications  2)Avoid ibuprofen/Advil/Aleve/Motrin/Goody Powders/Naproxen/BC powders/Meloxicam/Diclofenac/Indomethacin and other Nonsteroidal anti-inflammatory medications as these will make you more likely to bleed and can cause stomach ulcers, can also cause Kidney problems.  3)follow up with Dr. Lovena Le for pacemaker battery check   Increase activity slowly   Complete by: As directed        Discharge Medications     Allergies as of 07/02/2022       Reactions   Amitriptyline Hcl Other (See Comments)   Caused "jaws to twist and lock"   Plavix [clopidogrel Bisulfate] Hives   Sulfonamide Derivatives Other (See Comments)   UNKNOWN REACTION        Medication List     STOP taking these medications    ondansetron 4 MG tablet Commonly known as: ZOFRAN   potassium chloride SA 20 MEQ tablet Commonly known as: KLOR-CON M       TAKE these medications    ALPRAZolam 1 MG tablet Commonly known as: XANAX Take 1 mg by mouth 3 (three) times daily as needed for anxiety.   amiodarone 200 MG tablet Commonly known as: PACERONE TAKE 1 TABLET ONCE DAILY ON MONDAY THROUGH SATURDAY, NONE ON SUNDAY.   amLODipine 5 MG tablet Commonly known as: NORVASC Take 1 tablet (5 mg total) by mouth daily. What changed:  medication strength See the new instructions.   aspirin EC 81 MG tablet Take 1 tablet (81 mg total) by mouth daily with  breakfast. Swallow whole. What changed: when to take this   dicyclomine 10 MG capsule Commonly known as: BENTYL TAKE (1) CAPSULE BY MOUTH TWICE DAILY.   doxycycline 100 MG tablet Commonly known as: VIBRA-TABS Take 1 tablet (100 mg total) by mouth 2 (two) times daily for 5 days.   furosemide 40 MG tablet Commonly known as: LASIX Take 1 tablet (40 mg total) by mouth daily as needed for fluid or edema.   isosorbide mononitrate 30 MG 24 hr tablet Commonly known as: IMDUR Take 1 tablet (30 mg total) by mouth daily. What changed: how much to take   Jardiance 25 MG Tabs tablet Generic drug: empagliflozin Take 25 mg by mouth daily.   metoprolol succinate 50 MG 24 hr tablet Commonly known as: TOPROL-XL Take 1 tablet (50 mg total) by mouth 2 (two) times daily. Take with or immediately following a meal. What changed:  medication strength See the new instructions.   multivitamin with minerals Tabs tablet Take 1 tablet by mouth daily.   nitroGLYCERIN 0.4 MG SL tablet Commonly known as: NITROSTAT Place 1 tablet (0.4 mg total) under the tongue every 5 (five) minutes x 3 doses as needed for chest pain.   OXYGEN Inhale 2 L into the lungs as needed (PRN as needed at night).   pantoprazole 40 MG tablet Commonly known as: PROTONIX TAKE (1) TABLET BY MOUTH ONCE DAILY.   potassium chloride 10 MEQ tablet Commonly known as: KLOR-CON Take 1 tablet (10 mEq total) by mouth daily. Take While taking Lasix/furosemide   predniSONE 20 MG tablet Commonly known as: DELTASONE Take 2 tablets (40 mg total) by mouth daily with breakfast for 5 days.   primidone 50 MG tablet Commonly known as: MYSOLINE Take 50 mg by mouth at bedtime.   ProAir HFA 108 (90 Base) MCG/ACT inhaler Generic drug: albuterol Inhale 1 puff into the lungs every 4 (four) hours as needed for wheezing or shortness of breath. What changed: Another medication with the same name was added. Make sure you understand how and when to  take each.   albuterol (2.5 MG/3ML) 0.083% nebulizer solution Commonly known as: PROVENTIL Take 3 mLs (2.5 mg total) by nebulization every 4 (four) hours as needed for wheezing or shortness of breath. What changed: You were already taking a medication with the same name, and this prescription was added. Make sure you understand how and when to take each.   ranolazine 500 MG 12 hr tablet Commonly known as: RANEXA Take 1 tablet (500 mg total) by mouth 2 (two) times daily. What changed: See the new instructions.   rosuvastatin 40 MG tablet Commonly known as: Crestor Take 1 tablet (40 mg total) by mouth daily. What changed: additional instructions   senna-docusate 8.6-50 MG tablet Commonly known as: Senokot-S Take 1 tablet by mouth at bedtime. What changed:  how much to take when to take this reasons to take this   ticagrelor 60 MG Tabs tablet Commonly known as: Brilinta Take 1 tablet (60 mg total) by mouth 2 (two) times daily.   Vitamin B-12 5000 MCG Subl Take 5,000 mcg by mouth daily.   VITAMIN D (CHOLECALCIFEROL) PO Take 1 tablet by mouth daily.   vitamin E 180 MG (400 UNITS) capsule Generic drug: vitamin E Take 400 Units by mouth daily.       Major procedures and Radiology Reports - PLEASE review detailed and final reports for all details, in brief -   ECHOCARDIOGRAM COMPLETE  Result Date: 06/30/2022    ECHOCARDIOGRAM REPORT   Patient Name:   CALIN ELLERY Date of Exam: 06/30/2022 Medical Rec #:  [id number]    Height:       65.0 in Accession #:    [id number]   Weight:       135.4 lb Date of Birth:  1936-04-14    BSA:          1.676 m Patient Age:    80 years     BP:           127/82 mmHg Patient Gender: F            HR:           66 bpm. Exam Location:  [facility] Procedure: 2D Echo, Cardiac Doppler and Color Doppler Indications:    I50.40* Unspecified combined systolic (congestive) and diastolic                 (congestive) heart failure  History:        Patient has  prior history of Echocardiogram examinations, most                 recent 03/20/2021. Previous Myocardial Infarction and CAD,                 Abnormal ECG and Pacemaker, COPD, Arrythmias:Atrial                 Fibrillation, Signs/Symptoms:Dyspnea, Shortness of Breath and                 Chest Pain; Risk Factors:Dyslipidemia and Hypertension.  Sonographer:    Roseanna Rainbow RDCS Referring Phys: [id number] Leanne Chang [facility]  Sonographer Comments: Suboptimal parasternal window. IMPRESSIONS  1. Left ventricular ejection fraction, by estimation, is 55 to 60%. The left ventricle has normal function. The left ventricle has no regional wall motion abnormalities. Left ventricular diastolic function could not be evaluated.  2. Right ventricular systolic function not well visualized but appears to be mildy reduced. The right ventricular size is mildly enlarged. There is normal pulmonary  artery systolic pressure.  3. Left atrial size was moderately dilated.  4. Right atrial size was moderately dilated.  5. The mitral valve is abnormal. Mild to moderate mitral valve regurgitation. No evidence of mitral stenosis. Moderate mitral annular calcification.  6. There is restricted mobility of left coronary cusp compared to right and non-coronary cusps.. The aortic valve is tricuspid. There is moderate calcification of the aortic valve. Aortic valve regurgitation is mild. No aortic stenosis is present.  7. Aortic dilatation noted. There is mild dilatation of the aortic root, measuring 38 mm.  8. The inferior vena cava is dilated in size with >50% respiratory variability, suggesting right atrial pressure of 8 mmHg. Comparison(s): No significant change from prior study. Prior images reviewed side by side. FINDINGS  Left Ventricle: Left ventricular ejection fraction, by estimation, is 55 to 60%. The left ventricle has normal function. The left ventricle has no regional wall motion abnormalities. The left ventricular internal cavity size was normal  in size. There is  borderline left ventricular hypertrophy. Abnormal (paradoxical) septal motion, consistent with RV pacemaker. Left ventricular diastolic function could not be evaluated due to paced rhythm. Left ventricular diastolic function could not be evaluated. Right Ventricle: The right ventricular size is mildly enlarged. Right vetricular wall thickness was not well visualized. Right ventricular systolic function not well visualized but appears to be mildy reduced. There is normal pulmonary artery systolic pressure. The tricuspid regurgitant velocity is 2.63 m/s, and with an assumed right atrial pressure of 8 mmHg, the estimated right ventricular systolic pressure is 53.6 mmHg. Left Atrium: Left atrial size was moderately dilated. Right Atrium: Right atrial size was moderately dilated. Pericardium: There is no evidence of pericardial effusion. Mitral Valve: The mitral valve is abnormal. Moderate mitral annular calcification. Mild to moderate mitral valve regurgitation. No evidence of mitral valve stenosis. MV peak gradient, 4.7 mmHg. The mean mitral valve gradient is 1.0 mmHg. Tricuspid Valve: The tricuspid valve is not well visualized. Tricuspid valve regurgitation is mild . No evidence of tricuspid stenosis. Aortic Valve: There is restricted mobility of left coronary cusp compared to right and non-coronary cusps. The aortic valve is tricuspid. There is moderate calcification of the aortic valve. Aortic valve regurgitation is mild. Aortic regurgitation PHT measures 631 msec. No aortic stenosis is present. Aortic valve mean gradient measures 3.0 mmHg. Aortic valve peak gradient measures 5.9 mmHg. Aortic valve area, by VTI measures 2.87 cm. Pulmonic Valve: The pulmonic valve was not well visualized. Pulmonic valve regurgitation is trivial. No evidence of pulmonic stenosis. Aorta: Aortic dilatation noted. There is mild dilatation of the aortic root, measuring 38 mm. Venous: The inferior vena cava is dilated  in size with greater than 50% respiratory variability, suggesting right atrial pressure of 8 mmHg. IAS/Shunts: No atrial level shunt detected by color flow Doppler. Additional Comments: A device lead is visualized in the right ventricle and right atrium.  LEFT VENTRICLE PLAX 2D LVIDd:         4.00 cm LVIDs:         2.70 cm LV PW:         1.10 cm LV IVS:        1.30 cm LVOT diam:     2.20 cm LV SV:         62 LV SV Index:   37 LVOT Area:     3.80 cm  LV Volumes (MOD) LV vol d, MOD A2C: 61.9 ml LV vol d, MOD A4C: 70.8 ml LV vol s,  MOD A2C: 16.0 ml LV vol s, MOD A4C: 25.7 ml LV SV MOD A2C:     45.9 ml LV SV MOD A4C:     70.8 ml LV SV MOD BP:      45.3 ml RIGHT VENTRICLE            IVC RV S prime:     7.40 cm/s  IVC diam: 2.10 cm TAPSE (M-mode): 0.7 cm LEFT ATRIUM             Index        RIGHT ATRIUM           Index LA diam:        3.90 cm 2.33 cm/m   RA Area:     18.60 cm LA Vol (A2C):   63.4 ml 37.83 ml/m  RA Volume:   52.10 ml  31.09 ml/m LA Vol (A4C):   76.5 ml 45.65 ml/m LA Biplane Vol: 71.0 ml 42.37 ml/m  AORTIC VALVE                    PULMONIC VALVE AV Area (Vmax):    2.73 cm     PR End Diast Vel: 1.54 msec AV Area (Vmean):   2.50 cm AV Area (VTI):     2.87 cm AV Vmax:           121.00 cm/s AV Vmean:          84.800 cm/s AV VTI:            0.217 m AV Peak Grad:      5.9 mmHg AV Mean Grad:      3.0 mmHg LVOT Vmax:         86.90 cm/s LVOT Vmean:        55.700 cm/s LVOT VTI:          0.164 m LVOT/AV VTI ratio: 0.76 AI PHT:            631 msec  AORTA Ao Root diam: 3.80 cm Ao Asc diam:  3.50 cm MITRAL VALVE               TRICUSPID VALVE MV Area (PHT): 3.60 cm    TR Peak grad:   27.7 mmHg MV Area VTI:   2.28 cm    TR Vmax:        263.00 cm/s MV Peak grad:  4.7 mmHg MV Mean grad:  1.0 mmHg    SHUNTS MV Vmax:       1.08 m/s    Systemic VTI:  0.16 m MV Vmean:      55.2 cm/s   Systemic Diam: 2.20 cm MV Decel Time: 211 msec MV E velocity: 94.85 cm/s MV A velocity: 47.45 cm/s MV E/A ratio:  2.00 Vishnu Priya  Mallipeddi Electronically signed by Lorelee Cover Mallipeddi Signature Date/Time: 06/30/2022/3:18:11 PM    Final    CT Angio Chest/Abd/Pel for Dissection W and/or Wo Contrast  Result Date: 06/29/2022 CLINICAL DATA:  Acute aortic syndrome (AAS) suspected Patient presents with breath. Medical history includes HTN, CAD, CHF, HLD, pericardial effusion s/p pericardial window in 2010,. Short of breath. EXAM: CT ANGIOGRAPHY CHEST, ABDOMEN AND PELVIS TECHNIQUE: Non-contrast CT of the chest was initially obtained. Multidetector CT imaging through the chest, abdomen and pelvis was performed using the standard protocol during bolus administration of intravenous contrast. Multiplanar reconstructed images and MIPs were obtained and reviewed to evaluate the vascular anatomy. RADIATION DOSE REDUCTION: This exam was performed according  to the departmental dose-optimization program which includes automated exposure control, adjustment of the mA and/or kV according to patient size and/or use of iterative reconstruction technique. CONTRAST:  119m OMNIPAQUE IOHEXOL 350 MG/ML SOLN COMPARISON:  CT abdomen pelvis 05/01/2022 FINDINGS: CTA CHEST FINDINGS Cardiovascular: Left chest wall 2 lead pacemaker in grossly appropriate position. Preferential opacification of the thoracic aorta. No evidence of thoracic aortic aneurysm or dissection. The main pulmonary artery is enlarged in caliber measuring up to at least 3.4 cm. No central or proximal segmental pulmonary embolus. Limited evaluation more distally due to timing of contrast and streak artifact. Enlarged heart size. No significant pericardial effusion. Severe atherosclerotic plaque of the thoracic aorta. No coronary artery calcifications. Mediastinum/Nodes: No enlarged mediastinal, hilar, or axillary lymph nodes. Thyroid gland, trachea, and esophagus demonstrate no significant findings. Lungs/Pleura: Biapical pleural/pulmonary scarring. Mild paraseptal and centrilobular emphysematous  changes. Right lower lobe passive atelectasis. No focal consolidation. No pulmonary nodule. No pulmonary mass. Trace bilateral, right greater than left, pleural effusions. No pneumothorax. Musculoskeletal: No chest wall abnormality. No suspicious lytic or blastic osseous lesions. No acute displaced fracture. Multilevel degenerative changes of the spine. Review of the MIP images confirms the above findings. CTA ABDOMEN AND PELVIS FINDINGS VASCULAR Aorta: Severe atherosclerotic plaque. Normal caliber aorta without aneurysm, dissection, vasculitis or significant stenosis. Celiac: Patent without evidence of aneurysm, dissection, vasculitis or significant stenosis. SMA: Patent without evidence of aneurysm, dissection, vasculitis or significant stenosis. Renals: Both renal arteries are patent without evidence of aneurysm, dissection, vasculitis, fibromuscular dysplasia or significant stenosis. IMA: Patent without evidence of aneurysm, dissection, vasculitis or significant stenosis. Inflow: Mild to moderate atherosclerotic plaque. Patent without evidence of aneurysm, dissection, vasculitis or significant stenosis. Veins: No obvious venous abnormality within the limitations of this arterial phase study. Review of the MIP images confirms the above findings. NON-VASCULAR Hepatobiliary: Chronic punctate intraparenchymal calcifications consistent with sequelae of prior granulomatous disease. No focal liver abnormality. Status post cholecystectomy. No biliary dilatation. Pancreas: Diffusely atrophic. Multiple fluid density lesions and hypodense lesions along the pancreatic uncinate process and head as well as along the pancreatic body. Otherwise normal pancreatic contour. No surrounding inflammatory changes. No main pancreatic ductal dilatation. Spleen: Chronic intraparenchymal calcifications consistent with sequelae of prior granulomatous disease. Otherwise normal in size without focal abnormality. Adrenals/Urinary Tract: No  adrenal nodule bilaterally. Bilateral kidneys enhance symmetrically. Renal cortical scarring on the left with possible partial nephrectomy. No hydronephrosis. No hydroureter. The urinary bladder is unremarkable. Stomach/Bowel: Stomach is within normal limits. No evidence of bowel wall thickening or dilatation. Status post appendectomy. Lymphatic: No lymphadenopathy. Reproductive: Status post hysterectomy. No adnexal masses. Other: No intraperitoneal free fluid. No intraperitoneal free gas. No organized fluid collection. Musculoskeletal: No abdominal wall hernia or abnormality. No suspicious lytic or blastic osseous lesions. No acute displaced fracture. Multilevel severe degenerative changes of the spine. Review of the MIP images confirms the above findings. IMPRESSION: 1. No acute aortic abnormality. 2. No central or proximal segmental pulmonary embolus. Limited evaluation more distally. 3. Trace bilateral, right greater than left, pleural effusions. 4. Cardiomegaly. 5. No acute intra-abdominal or intrapelvic abnormality. 6. Persistent pancreatic hypodense lesions that may represent IPMNs. Recommend follow-up with MR pancreatic protocol as per CT abdomen pelvis 05/01/2022. 7. Aortic Atherosclerosis (ICD10-I70.0) and Emphysema (ICD10-J43.9). Electronically Signed   By: MIven FinnM.D.   On: 06/29/2022 16:51   DG Chest Port 1 View  Result Date: 06/29/2022 CLINICAL DATA:  Shortness of breath EXAM: PORTABLE CHEST 1 VIEW COMPARISON:  03/30/2021 radiograph and  prior studies FINDINGS: Cardiomegaly and LEFT-sided pacemaker again noted. Mild pulmonary vascular congestion is noted. Trace RIGHT pleural effusion noted with mild bibasilar atelectasis. There is no evidence of pneumothorax or acute bony abnormality. IMPRESSION: Cardiomegaly with mild pulmonary vascular congestion, trace RIGHT pleural effusion and mild bibasilar atelectasis. Electronically Signed   By: Margarette Canada M.D.   On: 06/29/2022 11:47   CUP  PACEART REMOTE DEVICE CHECK  Result Date: 06/20/2022 Scheduled remote reviewed. Normal device function.  Device reached ERI 10/27 Next remote to be determined - route to triage LA   Micro Results  Recent Results (from the past 240 hour(s))  Resp Panel by RT-PCR (Flu A&B, Covid) Anterior Nasal Swab     Status: None   Collection Time: 06/29/22 12:32 PM   Specimen: Anterior Nasal Swab  Result Value Ref Range Status   SARS Coronavirus 2 by RT PCR NEGATIVE NEGATIVE Final    Comment: (NOTE) SARS-CoV-2 target nucleic acids are NOT DETECTED.  The SARS-CoV-2 RNA is generally detectable in upper respiratory specimens during the acute phase of infection. The lowest concentration of SARS-CoV-2 viral copies this assay can detect is 138 copies/mL. A negative result does not preclude SARS-Cov-2 infection and should not be used as the sole basis for treatment or other patient management decisions. A negative result may occur with  improper specimen collection/handling, submission of specimen other than nasopharyngeal swab, presence of viral mutation(s) within the areas targeted by this assay, and inadequate number of viral copies(<138 copies/mL). A negative result must be combined with clinical observations, patient history, and epidemiological information. The expected result is Negative.  Fact Sheet for Patients:  EntrepreneurPulse.com.au  Fact Sheet for Healthcare Providers:  IncredibleEmployment.be  This test is no t yet approved or cleared by the [place] FDA and  has been authorized for detection and/or diagnosis of SARS-CoV-2 by FDA under an Emergency Use Authorization (EUA). This EUA will remain  in effect (meaning this test can be used) for the duration of the COVID-19 declaration under Section 564(b)(1) of the Act, 21 U.S.C.section 360bbb-3(b)(1), unless the authorization is terminated  or revoked sooner.       Influenza A by PCR NEGATIVE  NEGATIVE Final   Influenza B by PCR NEGATIVE NEGATIVE Final    Comment: (NOTE) The Xpert Xpress SARS-CoV-2/FLU/RSV plus assay is intended as an aid in the diagnosis of influenza from Nasopharyngeal swab specimens and should not be used as a sole basis for treatment. Nasal washings and aspirates are unacceptable for Xpert Xpress SARS-CoV-2/FLU/RSV testing.  Fact Sheet for Patients: EntrepreneurPulse.com.au  Fact Sheet for Healthcare Providers: IncredibleEmployment.be  This test is not yet approved or cleared by the [place] FDA and has been authorized for detection and/or diagnosis of SARS-CoV-2 by FDA under an Emergency Use Authorization (EUA). This EUA will remain in effect (meaning this test can be used) for the duration of the COVID-19 declaration under Section 564(b)(1) of the Act, 21 U.S.C. section 360bbb-3(b)(1), unless the authorization is terminated or revoked.  Performed at [facility], [address], [place], [place] [address]    Today   Subjective    Deaun Rocha today has no new complaints Chest pain-free -No dyspnea at rest -No dyspnea on exertion -Ambulating around the room   Patient has been seen and examined prior to discharge   Objective   Blood pressure 130/76, pulse 73, temperature 97.8 F (36.6 C), resp. rate 19, height '5\' 5"'$  (1.651 m), weight 62 kg, SpO2 98 %.   Intake/Output  Summary (Last 24 hours) at 07/02/2022 1227 Last data filed at 07/02/2022 0500 Gross per 24 hour  Intake --  Output 1550 ml  Net -1550 ml    Exam Gen:- Awake Alert, no acute distress  HEENT:- [place].AT, No sclera icterus Neck-Supple Neck,No JVD,.  Lungs-  CTAB , good air movement bilaterally CV- S1, S2 normal, regular, pacemaker in situ Abd-  +ve B.Sounds, Abd Soft, No tenderness,    Extremity/Skin:- No  edema,   good pulses Psych-affect is appropriate, oriented x3 Neuro-no new focal deficits, no tremors    Data Review   CBC  w Diff:  Lab Results  Component Value Date   WBC 7.5 06/30/2022   HGB 12.9 06/30/2022   HCT 40.6 06/30/2022   PLT 193 06/30/2022   LYMPHOPCT 26 06/29/2022   MONOPCT 9 06/29/2022   EOSPCT 2 06/29/2022   BASOPCT 1 06/29/2022    CMP:  Lab Results  Component Value Date   NA 137 07/02/2022   K 4.2 07/02/2022   CL 100 07/02/2022   CO2 29 07/02/2022   BUN 25 (H) 07/02/2022   CREATININE 0.83 07/02/2022   CREATININE 0.62 04/08/2016   PROT 8.0 06/29/2022   ALBUMIN 4.0 06/29/2022   BILITOT 0.5 06/29/2022   ALKPHOS 87 06/29/2022   AST 28 06/29/2022   ALT 18 06/29/2022  .  Total Discharge time is about 33 minutes  Roxan Hockey M.D on 07/02/2022 at 12:27 PM  Go to [email or web address] -  for contact info  [facility] - Office  [phone]

## 2022-07-03 NOTE — Patient Outreach (Signed)
[name] 30-Dec-1935 [id number]   Primary Care Provider:  Celene Squibb, MD  Insurance: [facility] Liaison remote review for patient that had admitted to [facility]  Follow up for post hospital needs.  Patient was showing as active with [place] Coordinator, outreached RNCC to check follow up status and to update Care Teams, if not active.  Patient transitioned home from [facility]. Home Health noted with [facility] per inpatient [facility] team notes.  Natividad Brood, RN BSN [place]  [phone] business mobile phone Toll free office [phone]  *[place]  [phone] Fax number: [phone] [email or web address]

## 2022-07-04 ENCOUNTER — Telehealth: Payer: Self-pay | Admitting: Cardiology

## 2022-07-04 DIAGNOSIS — K219 Gastro-esophageal reflux disease without esophagitis: Secondary | ICD-10-CM | POA: Diagnosis not present

## 2022-07-04 DIAGNOSIS — I110 Hypertensive heart disease with heart failure: Secondary | ICD-10-CM | POA: Diagnosis not present

## 2022-07-04 DIAGNOSIS — I2510 Atherosclerotic heart disease of native coronary artery without angina pectoris: Secondary | ICD-10-CM | POA: Diagnosis not present

## 2022-07-04 DIAGNOSIS — M549 Dorsalgia, unspecified: Secondary | ICD-10-CM | POA: Diagnosis not present

## 2022-07-04 DIAGNOSIS — K5900 Constipation, unspecified: Secondary | ICD-10-CM | POA: Diagnosis not present

## 2022-07-04 DIAGNOSIS — I241 Dressler's syndrome: Secondary | ICD-10-CM | POA: Diagnosis not present

## 2022-07-04 DIAGNOSIS — I3139 Other pericardial effusion (noninflammatory): Secondary | ICD-10-CM | POA: Diagnosis not present

## 2022-07-04 DIAGNOSIS — M75122 Complete rotator cuff tear or rupture of left shoulder, not specified as traumatic: Secondary | ICD-10-CM | POA: Diagnosis not present

## 2022-07-04 DIAGNOSIS — Z7984 Long term (current) use of oral hypoglycemic drugs: Secondary | ICD-10-CM | POA: Diagnosis not present

## 2022-07-04 DIAGNOSIS — G709 Myoneural disorder, unspecified: Secondary | ICD-10-CM | POA: Diagnosis not present

## 2022-07-04 DIAGNOSIS — J441 Chronic obstructive pulmonary disease with (acute) exacerbation: Secondary | ICD-10-CM | POA: Diagnosis not present

## 2022-07-04 DIAGNOSIS — J9611 Chronic respiratory failure with hypoxia: Secondary | ICD-10-CM | POA: Diagnosis not present

## 2022-07-04 DIAGNOSIS — I5033 Acute on chronic diastolic (congestive) heart failure: Secondary | ICD-10-CM | POA: Diagnosis not present

## 2022-07-04 DIAGNOSIS — H04553 Acquired stenosis of bilateral nasolacrimal duct: Secondary | ICD-10-CM | POA: Diagnosis not present

## 2022-07-04 DIAGNOSIS — I4891 Unspecified atrial fibrillation: Secondary | ICD-10-CM | POA: Diagnosis not present

## 2022-07-04 DIAGNOSIS — H9190 Unspecified hearing loss, unspecified ear: Secondary | ICD-10-CM | POA: Diagnosis not present

## 2022-07-04 DIAGNOSIS — Z955 Presence of coronary angioplasty implant and graft: Secondary | ICD-10-CM | POA: Diagnosis not present

## 2022-07-04 DIAGNOSIS — I495 Sick sinus syndrome: Secondary | ICD-10-CM | POA: Diagnosis not present

## 2022-07-04 DIAGNOSIS — Z8616 Personal history of COVID-19: Secondary | ICD-10-CM | POA: Diagnosis not present

## 2022-07-04 DIAGNOSIS — G8929 Other chronic pain: Secondary | ICD-10-CM | POA: Diagnosis not present

## 2022-07-04 DIAGNOSIS — Z7951 Long term (current) use of inhaled steroids: Secondary | ICD-10-CM | POA: Diagnosis not present

## 2022-07-04 DIAGNOSIS — K869 Disease of pancreas, unspecified: Secondary | ICD-10-CM | POA: Diagnosis not present

## 2022-07-04 DIAGNOSIS — I4581 Long QT syndrome: Secondary | ICD-10-CM | POA: Diagnosis not present

## 2022-07-04 NOTE — Telephone Encounter (Signed)
Mandy, nurse with [employer] left a message stating that the patient has a rash on her legs that's caused by Brilinta and that the patient needed to be contacted for more information.  Called patient and she states that she is unsure if the Brilinta caused the rash (left lower leg) because she has found out recently that she is allergic to the laundry detergent that she has always used. States that she noticed the rash a short time after seeing Dr. Domenic Polite on 11/13 but does not know exactly when the rash started. States that she has been using lotion and the rash has since dried up and is gone for the most part. Please advise

## 2022-07-04 NOTE — Telephone Encounter (Signed)
Pt c/o medication issue:  1. Name of Medication:   ticagrelor (BRILINTA) 60 MG TABS tablet   2. How are you currently taking this medication (dosage and times per day)? Yes  3. Are you having a reaction (difficulty breathing--STAT)?   4. What is your medication issue?   Patient stated she just realized she has been taking the 90 mg of this medication but the Dr. Domenic Polite had decreased her dosage to '60mg'$ .  Patient wants to confirm she should be taking the 60 mg dosage.

## 2022-07-04 NOTE — Telephone Encounter (Signed)
Pt c/o medication issue:  1. Name of Medication: ticagrelor (BRILINTA) 60 MG TABS tablet   2. How are you currently taking this medication (dosage and times per day)? 1 tablet twice a day  3. Are you having a reaction (difficulty breathing--STAT)? no  4. What is your medication issue? Mandy with [facility] states the patient has a rash on her legs from the medication. She would like the call back to go to the patient.

## 2022-07-07 DIAGNOSIS — M75122 Complete rotator cuff tear or rupture of left shoulder, not specified as traumatic: Secondary | ICD-10-CM | POA: Diagnosis not present

## 2022-07-07 DIAGNOSIS — I4581 Long QT syndrome: Secondary | ICD-10-CM | POA: Diagnosis not present

## 2022-07-07 DIAGNOSIS — I241 Dressler's syndrome: Secondary | ICD-10-CM | POA: Diagnosis not present

## 2022-07-07 DIAGNOSIS — J9611 Chronic respiratory failure with hypoxia: Secondary | ICD-10-CM | POA: Diagnosis not present

## 2022-07-07 DIAGNOSIS — I4891 Unspecified atrial fibrillation: Secondary | ICD-10-CM | POA: Diagnosis not present

## 2022-07-07 DIAGNOSIS — H04553 Acquired stenosis of bilateral nasolacrimal duct: Secondary | ICD-10-CM | POA: Diagnosis not present

## 2022-07-07 DIAGNOSIS — Z8616 Personal history of COVID-19: Secondary | ICD-10-CM | POA: Diagnosis not present

## 2022-07-07 DIAGNOSIS — I495 Sick sinus syndrome: Secondary | ICD-10-CM | POA: Diagnosis not present

## 2022-07-07 DIAGNOSIS — Z955 Presence of coronary angioplasty implant and graft: Secondary | ICD-10-CM | POA: Diagnosis not present

## 2022-07-07 DIAGNOSIS — Z7951 Long term (current) use of inhaled steroids: Secondary | ICD-10-CM | POA: Diagnosis not present

## 2022-07-07 DIAGNOSIS — Z7984 Long term (current) use of oral hypoglycemic drugs: Secondary | ICD-10-CM | POA: Diagnosis not present

## 2022-07-07 DIAGNOSIS — G709 Myoneural disorder, unspecified: Secondary | ICD-10-CM | POA: Diagnosis not present

## 2022-07-07 DIAGNOSIS — K219 Gastro-esophageal reflux disease without esophagitis: Secondary | ICD-10-CM | POA: Diagnosis not present

## 2022-07-07 DIAGNOSIS — I5033 Acute on chronic diastolic (congestive) heart failure: Secondary | ICD-10-CM | POA: Diagnosis not present

## 2022-07-07 DIAGNOSIS — J441 Chronic obstructive pulmonary disease with (acute) exacerbation: Secondary | ICD-10-CM | POA: Diagnosis not present

## 2022-07-07 DIAGNOSIS — I110 Hypertensive heart disease with heart failure: Secondary | ICD-10-CM | POA: Diagnosis not present

## 2022-07-07 DIAGNOSIS — M549 Dorsalgia, unspecified: Secondary | ICD-10-CM | POA: Diagnosis not present

## 2022-07-07 DIAGNOSIS — K5900 Constipation, unspecified: Secondary | ICD-10-CM | POA: Diagnosis not present

## 2022-07-07 DIAGNOSIS — K869 Disease of pancreas, unspecified: Secondary | ICD-10-CM | POA: Diagnosis not present

## 2022-07-07 DIAGNOSIS — G8929 Other chronic pain: Secondary | ICD-10-CM | POA: Diagnosis not present

## 2022-07-07 DIAGNOSIS — I2510 Atherosclerotic heart disease of native coronary artery without angina pectoris: Secondary | ICD-10-CM | POA: Diagnosis not present

## 2022-07-07 DIAGNOSIS — I3139 Other pericardial effusion (noninflammatory): Secondary | ICD-10-CM | POA: Diagnosis not present

## 2022-07-07 DIAGNOSIS — H9190 Unspecified hearing loss, unspecified ear: Secondary | ICD-10-CM | POA: Diagnosis not present

## 2022-07-07 NOTE — Telephone Encounter (Signed)
Patient informed and verbalized understanding of plan. 

## 2022-07-09 NOTE — Addendum Note (Signed)
Addended by: Cheri Kearns A on: 07/09/2022 10:18 AM   Modules accepted: Level of Service

## 2022-07-09 NOTE — Progress Notes (Signed)
Remote pacemaker transmission.   

## 2022-07-10 NOTE — Telephone Encounter (Signed)
Patient informed and verbalized understanding of plan. 

## 2022-07-11 DIAGNOSIS — Z955 Presence of coronary angioplasty implant and graft: Secondary | ICD-10-CM | POA: Diagnosis not present

## 2022-07-11 DIAGNOSIS — I5033 Acute on chronic diastolic (congestive) heart failure: Secondary | ICD-10-CM | POA: Diagnosis not present

## 2022-07-11 DIAGNOSIS — I110 Hypertensive heart disease with heart failure: Secondary | ICD-10-CM | POA: Diagnosis not present

## 2022-07-11 DIAGNOSIS — Z7984 Long term (current) use of oral hypoglycemic drugs: Secondary | ICD-10-CM | POA: Diagnosis not present

## 2022-07-11 DIAGNOSIS — K219 Gastro-esophageal reflux disease without esophagitis: Secondary | ICD-10-CM | POA: Diagnosis not present

## 2022-07-11 DIAGNOSIS — M75122 Complete rotator cuff tear or rupture of left shoulder, not specified as traumatic: Secondary | ICD-10-CM | POA: Diagnosis not present

## 2022-07-11 DIAGNOSIS — I241 Dressler's syndrome: Secondary | ICD-10-CM | POA: Diagnosis not present

## 2022-07-11 DIAGNOSIS — I3139 Other pericardial effusion (noninflammatory): Secondary | ICD-10-CM | POA: Diagnosis not present

## 2022-07-11 DIAGNOSIS — J441 Chronic obstructive pulmonary disease with (acute) exacerbation: Secondary | ICD-10-CM | POA: Diagnosis not present

## 2022-07-11 DIAGNOSIS — I495 Sick sinus syndrome: Secondary | ICD-10-CM | POA: Diagnosis not present

## 2022-07-11 DIAGNOSIS — J9611 Chronic respiratory failure with hypoxia: Secondary | ICD-10-CM | POA: Diagnosis not present

## 2022-07-11 DIAGNOSIS — H04553 Acquired stenosis of bilateral nasolacrimal duct: Secondary | ICD-10-CM | POA: Diagnosis not present

## 2022-07-11 DIAGNOSIS — K869 Disease of pancreas, unspecified: Secondary | ICD-10-CM | POA: Diagnosis not present

## 2022-07-11 DIAGNOSIS — K5900 Constipation, unspecified: Secondary | ICD-10-CM | POA: Diagnosis not present

## 2022-07-11 DIAGNOSIS — I4891 Unspecified atrial fibrillation: Secondary | ICD-10-CM | POA: Diagnosis not present

## 2022-07-11 DIAGNOSIS — M549 Dorsalgia, unspecified: Secondary | ICD-10-CM | POA: Diagnosis not present

## 2022-07-11 DIAGNOSIS — G709 Myoneural disorder, unspecified: Secondary | ICD-10-CM | POA: Diagnosis not present

## 2022-07-11 DIAGNOSIS — I2510 Atherosclerotic heart disease of native coronary artery without angina pectoris: Secondary | ICD-10-CM | POA: Diagnosis not present

## 2022-07-11 DIAGNOSIS — I4581 Long QT syndrome: Secondary | ICD-10-CM | POA: Diagnosis not present

## 2022-07-11 DIAGNOSIS — Z8616 Personal history of COVID-19: Secondary | ICD-10-CM | POA: Diagnosis not present

## 2022-07-11 DIAGNOSIS — G8929 Other chronic pain: Secondary | ICD-10-CM | POA: Diagnosis not present

## 2022-07-11 DIAGNOSIS — Z7951 Long term (current) use of inhaled steroids: Secondary | ICD-10-CM | POA: Diagnosis not present

## 2022-07-11 DIAGNOSIS — H9190 Unspecified hearing loss, unspecified ear: Secondary | ICD-10-CM | POA: Diagnosis not present

## 2022-07-15 ENCOUNTER — Encounter: Payer: Self-pay | Admitting: Internal Medicine

## 2022-07-15 ENCOUNTER — Encounter: Payer: Medicare Other | Admitting: Internal Medicine

## 2022-07-15 ENCOUNTER — Other Ambulatory Visit (HOSPITAL_COMMUNITY)
Admission: RE | Admit: 2022-07-15 | Discharge: 2022-07-15 | Disposition: A | Discharge status: Home / Self Care | Payer: Medicare Other | Source: Ambulatory Visit | Attending: Internal Medicine | Admitting: Internal Medicine

## 2022-07-15 ENCOUNTER — Ambulatory Visit: Payer: Medicare Other | Attending: Internal Medicine | Admitting: Internal Medicine

## 2022-07-15 VITALS — BP 138/78 | HR 62 | Ht 65.5 in | Wt 140.0 lb

## 2022-07-15 DIAGNOSIS — Z01810 Encounter for preprocedural cardiovascular examination: Secondary | ICD-10-CM | POA: Insufficient documentation

## 2022-07-15 LAB — BASIC METABOLIC PANEL
Anion gap: 10 (ref 5–15)
BUN: 9 mg/dL (ref 8–23)
CO2: 31 mmol/L (ref 22–32)
Calcium: 9.8 mg/dL (ref 8.9–10.3)
Chloride: 99 mmol/L (ref 98–111)
Creatinine, Ser: 0.83 mg/dL (ref 0.44–1.00)
GFR, Estimated: >60 mL/min (ref >60)
Glucose, Bld: 86 mg/dL (ref 70–99)
Potassium: 4.4 mmol/L (ref 3.5–5.1)
Sodium: 140 mmol/L (ref 135–145)

## 2022-07-15 LAB — CBC
HCT: 42.2 % (ref 36.0–46.0)
Hemoglobin: 13 g/dL (ref 12.0–15.0)
MCH: 30 pg (ref 26.0–34.0)
MCHC: 30.8 g/dL (ref 30.0–36.0)
MCV: 97.2 fL (ref 80.0–100.0)
Platelets: 185 10*3/uL (ref 150–400)
RBC: 4.34 MIL/uL (ref 3.87–5.11)
RDW: 14.4 % (ref 11.5–15.5)
WBC: 9.3 10*3/uL (ref 4.0–10.5)
nRBC: 0 % (ref 0.0–0.2)

## 2022-07-15 NOTE — H&P (View-Only) (Signed)
HPI [name] returns today for followup. She is a pleasant 86 yo woman with a h/o HTN, persistent atrial fib, symptomatic tachy-brady syndrome s/p PPM insertion. In the interim, she has done well. She has minimal palpitations. She has not had syncope. She has no edema.  She feels poorly since she reverted to ERI VVI 65.  Allergies  Allergen Reactions   Amitriptyline Hcl Other (See Comments)    Caused "jaws to twist and lock"   Plavix [Clopidogrel Bisulfate] Hives   Sulfonamide Derivatives Other (See Comments)    UNKNOWN REACTION     Current Outpatient Medications  Medication Sig Dispense Refill   albuterol (PROVENTIL) (2.5 MG/3ML) 0.083% nebulizer solution Take 3 mLs (2.5 mg total) by nebulization every 4 (four) hours as needed for wheezing or shortness of breath. 75 mL 2   ALPRAZolam (XANAX) 1 MG tablet Take 1 mg by mouth 3 (three) times daily as needed for anxiety.     amiodarone (PACERONE) 200 MG tablet TAKE 1 TABLET ONCE DAILY ON MONDAY THROUGH SATURDAY, NONE ON SUNDAY. 72 tablet 3   amLODipine (NORVASC) 5 MG tablet Take 1 tablet (5 mg total) by mouth daily. 30 tablet 4   aspirin EC 81 MG tablet Take 1 tablet (81 mg total) by mouth daily with breakfast. Swallow whole. 30 tablet 12   Cyanocobalamin (VITAMIN B-12) 5000 MCG SUBL Take 5,000 mcg by mouth daily.     dicyclomine (BENTYL) 10 MG capsule TAKE (1) CAPSULE BY MOUTH TWICE DAILY. 60 capsule 0   furosemide (LASIX) 40 MG tablet Take 1 tablet (40 mg total) by mouth daily as needed for fluid or edema. 30 tablet 0   isosorbide mononitrate (IMDUR) 30 MG 24 hr tablet Take 1 tablet (30 mg total) by mouth daily. 90 tablet 5   JARDIANCE 25 MG TABS tablet Take 25 mg by mouth daily.     metoprolol succinate (TOPROL-XL) 50 MG 24 hr tablet Take 1 tablet (50 mg total) by mouth 2 (two) times daily. Take with or immediately following a meal. 60 tablet 4   Multiple Vitamin (MULTIVITAMIN WITH MINERALS) TABS tablet Take 1 tablet by mouth  daily.      nitroGLYCERIN (NITROSTAT) 0.4 MG SL tablet Place 1 tablet (0.4 mg total) under the tongue every 5 (five) minutes x 3 doses as needed for chest pain. 30 tablet 12   OXYGEN Inhale 2 L into the lungs as needed (PRN as needed at night).     pantoprazole (PROTONIX) 40 MG tablet TAKE (1) TABLET BY MOUTH ONCE DAILY. 90 tablet 0   potassium chloride (KLOR-CON) 10 MEQ tablet Take 1 tablet (10 mEq total) by mouth daily. Take While taking Lasix/furosemide 30 tablet 2   primidone (MYSOLINE) 50 MG tablet Take 50 mg by mouth at bedtime.     PROAIR HFA 108 (90 Base) MCG/ACT inhaler Inhale 1 puff into the lungs every 4 (four) hours as needed for wheezing or shortness of breath.      ranolazine (RANEXA) 500 MG 12 hr tablet Take 1 tablet (500 mg total) by mouth 2 (two) times daily. 60 tablet 8   rosuvastatin (CRESTOR) 40 MG tablet Take 1 tablet (40 mg total) by mouth daily. 90 tablet 3   senna-docusate (SENOKOT-S) 8.6-50 MG tablet Take 1 tablet by mouth at bedtime. (Patient taking differently: Take 1-2 tablets by mouth daily as needed (constipation.).) 30 tablet 0   ticagrelor (BRILINTA) 60 MG TABS tablet Take 1 tablet (60 mg total)  by mouth 2 (two) times daily. 60 tablet 6   VITAMIN D, CHOLECALCIFEROL, PO Take 1 tablet by mouth daily.     vitamin E 180 MG (400 UNITS) capsule Take 400 Units by mouth daily.     No current facility-administered medications for this visit.     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   Past Surgical History:  Procedure Laterality Date   APPENDECTOMY     BIOPSY  02/24/2022   Procedure: BIOPSY;  Surgeon: Rush Landmark Telford Nab., MD;  Location: [facility];  Service: Gastroenterology;;   CARDIAC CATHETERIZATION  2010   stents x2.   CATARACT EXTRACTION     CHOLECYSTECTOMY     COLONOSCOPY  2011   COLONOSCOPY N/A 10/19/2014   Procedure: COLONOSCOPY;  Surgeon: Rogene Houston, MD;  [facility]: [facility];  Service: Endoscopy;  Laterality: N/A;  1030   CORONARY STENT INTERVENTION N/A 05/28/2021   Procedure: CORONARY STENT INTERVENTION;  Surgeon: Leonie Man, MD;  Location: [place] [facility];  Service: Cardiovascular;  Laterality: N/A;   ESOPHAGEAL DILATION N/A 05/02/2020   Procedure: ESOPHAGEAL DILATION;  Surgeon: Rogene Houston, MD;  [facility];  Service: Gastroenterology;  Laterality: N/A;   ESOPHAGOGASTRODUODENOSCOPY N/A 10/26/2014   Procedure: ESOPHAGOGASTRODUODENOSCOPY (EGD);  Surgeon: Rogene Houston, MD;  [facility];  Service: Endoscopy;  Laterality: N/A;  67 - Dr. has lunch and learn   ESOPHAGOGASTRODUODENOSCOPY N/A 02/24/2022   Procedure: ESOPHAGOGASTRODUODENOSCOPY (EGD);  Surgeon: Irving Copas., MD;  Location: [facility];  Service: Gastroenterology;  Laterality: N/A;   ESOPHAGOGASTRODUODENOSCOPY (EGD) WITH PROPOFOL N/A 05/02/2020   Procedure: ESOPHAGOGASTRODUODENOSCOPY (EGD) WITH PROPOFOL;  Surgeon: Rogene Houston, MD;  [facility];  Service: Gastroenterology;  Laterality: N/A;  250   Esophagogastroduodenoscopy with esophageal dilation  2004, 2006, 2007   EUS N/A 02/24/2022   Procedure: UPPER ENDOSCOPIC ULTRASOUND (EUS) LINEAR;  Surgeon: Irving Copas., MD;  Location: [facility];  Service: Gastroenterology;  Laterality: N/A;   FINE NEEDLE ASPIRATION  02/24/2022   Procedure: FINE NEEDLE ASPIRATION;  Surgeon: Rush Landmark Telford Nab., MD;  Location: [facility];  Service: Gastroenterology;;  red path sent   GIVENS CAPSULE STUDY N/A 10/31/2014   Procedure: GIVENS CAPSULE STUDY;  Surgeon: Rogene Houston, MD;  [facility];  Service: Endoscopy;  Laterality: N/A;  730 -- pacemaker--needs monitoring--outpatient bed   INSERT / REPLACE / REMOVE PACEMAKER  2010   INTRAVASCULAR ULTRASOUND/IVUS N/A 05/28/2021   Procedure: Intravascular Ultrasound/IVUS;  Surgeon: Leonie Man, MD;  Location: [place] [facility];  Service: Cardiovascular;  Laterality: N/A;  LEFT HEART CATH AND CORONARY ANGIOGRAPHY N/A 03/21/2021   Procedure: LEFT HEART CATH AND CORONARY ANGIOGRAPHY;  Surgeon: Belva Crome, MD;  Location: [place] [facility];  Service: Cardiovascular;  Laterality: N/A;   LEFT HEART CATHETERIZATION WITH CORONARY ANGIOGRAM N/A 11/17/2011   Procedure: LEFT HEART CATHETERIZATION WITH CORONARY ANGIOGRAM;  Surgeon: Sherren Mocha, MD;  Location: [facility] CATH LAB;  Service: Cardiovascular;  Laterality: N/A;   LEFT HEART CATHETERIZATION WITH CORONARY ANGIOGRAM N/A 09/26/2014   Procedure: LEFT HEART CATHETERIZATION WITH CORONARY ANGIOGRAM;  Surgeon: Leonie Man, MD;  Location: [facility] CATH LAB;  Service: Cardiovascular;  Laterality: N/A;   Right rotator cuff repair     SAVORY DILATION N/A 02/24/2022   Procedure: SAVORY DILATION;  Surgeon: Rush Landmark Telford Nab., MD;  Location: [facility];  Service: Gastroenterology;  Laterality: N/A;   SHOULDER ACROMIOPLASTY Right 05/30/2015   Procedure: RIGHT SHOULDER ACROMIOPLASTY;  Surgeon: Carole Civil, MD;  Location: [facility];  Service: Orthopedics;  Laterality: Right;   SHOULDER OPEN ROTATOR CUFF REPAIR Right 05/30/2015   Procedure: OPEN ROTATOR CUFF REPAIR RIGHT SHOULDER;  Surgeon: Carole Civil, MD;  Location: [facility];   Service: Orthopedics;  Laterality: Right;   SHOULDER OPEN ROTATOR CUFF REPAIR Left 10/22/2016   Procedure: ROTATOR CUFF REPAIR SHOULDER OPEN;  Surgeon: Carole Civil, MD;  Location: [facility];  Service: Orthopedics;  Laterality: Left;   Subxiphoid pericardial window  11/10   VAGINAL HYSTERECTOMY       Family History  Problem Relation Age of Onset   Cancer Mother        Colon    Coronary artery disease Sister    Coronary artery disease Brother    Arthritis Other    Lung disease Other    Asthma Other      Social History   Socioeconomic History   Marital status: Widowed    Spouse name: Not on file   Number of children: 1   Years of education: Not on file   Highest education level: 8th grade  Occupational History   Occupation: Retired    Fish farm manager: RETIRED  Tobacco Use   Smoking status: Former    Packs/day: 2.00    Years: 10.00    Total pack years: 20.00    Types: Cigarettes    Quit date: 05/23/1989    Years since quitting: 33.1    Passive exposure: Past   Smokeless tobacco: Never  Vaping Use   Vaping Use: Never used  Substance and Sexual Activity   Alcohol use: No    Alcohol/week: 0.0 standard drinks of alcohol   Drug use: No   Sexual activity: Never  Other Topics Concern   Not on file  Social History Narrative   She lives alone, has adopted daughter.   Social Determinants of Health   Financial Resource Strain: Not on file  Food Insecurity: No Food Insecurity (06/30/2022)   Hunger Vital Sign    Worried About Running Out of Food in the Last Year: Never true    Ran Out of Food in the Last Year: Never true  Transportation Needs: No Transportation Needs (06/30/2022)   PRAPARE - Hydrologist (Medical): No    Lack of Transportation (Non-Medical): No  Physical Activity: Not on file  Stress: Not on file  Social Connections: Not on file  Intimate Partner Violence: Not At Risk (06/30/2022)   Humiliation, Afraid, Rape, and Kick  questionnaire    Fear of Current or Ex-Partner: No  Emotionally Abused: No    Physically Abused: No    Sexually Abused: No     BP 138/78   Pulse 62   Ht 5' 5.5" (1.664 m)   Wt 140 lb (63.5 kg)   SpO2 94%   BMI 22.94 kg/m   Physical Exam:  Well appearing NAD HEENT: Unremarkable Neck:  No JVD, no thyromegally Lymphatics:  No adenopathy Back:  No CVA tenderness Lungs:  Clear with no wheezes HEART:  Regular rate rhythm, no murmurs, no rubs, no clicks Abd:  soft, positive bowel sounds, no organomegally, no rebound, no guarding Ext:  2 plus pulses, no edema, no cyanosis, no clubbing Skin:  No rashes no nodules Neuro:  CN II through XII intact, motor grossly intact  DEVICE  Normal device function.  See PaceArt for details.   Assess/Plan:  1.Atrial fib - she is maintaining NSR almost 99% of the time. I have asked her to continue her current dose of amio. 2. PPM -her medtronic DDD PM is working normally. She has reached ERI and will undergo gen change out tomorrow. 3. Coags - she refuses an [place]. She will hold brilinta today and restart in a couple of days. 4. HTN - her bp is fairly well controlled. She is encouraged to avoid salty foods.   [name] Katrina Sailors,MD

## 2022-07-15 NOTE — Progress Notes (Signed)
HPI [name] returns today for followup. She is a pleasant 86 yo woman with a h/o HTN, persistent atrial fib, symptomatic tachy-brady syndrome s/p PPM insertion. In the interim, she has done well. She has minimal palpitations. She has not had syncope. She has no edema.  She feels poorly since she reverted to ERI VVI 65.  Allergies  Allergen Reactions   Amitriptyline Hcl Other (See Comments)    Caused "jaws to twist and lock"   Plavix [Clopidogrel Bisulfate] Hives   Sulfonamide Derivatives Other (See Comments)    UNKNOWN REACTION     Current Outpatient Medications  Medication Sig Dispense Refill   albuterol (PROVENTIL) (2.5 MG/3ML) 0.083% nebulizer solution Take 3 mLs (2.5 mg total) by nebulization every 4 (four) hours as needed for wheezing or shortness of breath. 75 mL 2   ALPRAZolam (XANAX) 1 MG tablet Take 1 mg by mouth 3 (three) times daily as needed for anxiety.     amiodarone (PACERONE) 200 MG tablet TAKE 1 TABLET ONCE DAILY ON MONDAY THROUGH SATURDAY, NONE ON SUNDAY. 72 tablet 3   amLODipine (NORVASC) 5 MG tablet Take 1 tablet (5 mg total) by mouth daily. 30 tablet 4   aspirin EC 81 MG tablet Take 1 tablet (81 mg total) by mouth daily with breakfast. Swallow whole. 30 tablet 12   Cyanocobalamin (VITAMIN B-12) 5000 MCG SUBL Take 5,000 mcg by mouth daily.     dicyclomine (BENTYL) 10 MG capsule TAKE (1) CAPSULE BY MOUTH TWICE DAILY. 60 capsule 0   furosemide (LASIX) 40 MG tablet Take 1 tablet (40 mg total) by mouth daily as needed for fluid or edema. 30 tablet 0   isosorbide mononitrate (IMDUR) 30 MG 24 hr tablet Take 1 tablet (30 mg total) by mouth daily. 90 tablet 5   JARDIANCE 25 MG TABS tablet Take 25 mg by mouth daily.     metoprolol succinate (TOPROL-XL) 50 MG 24 hr tablet Take 1 tablet (50 mg total) by mouth 2 (two) times daily. Take with or immediately following a meal. 60 tablet 4   Multiple Vitamin (MULTIVITAMIN WITH MINERALS) TABS tablet Take 1 tablet by mouth  daily.      nitroGLYCERIN (NITROSTAT) 0.4 MG SL tablet Place 1 tablet (0.4 mg total) under the tongue every 5 (five) minutes x 3 doses as needed for chest pain. 30 tablet 12   OXYGEN Inhale 2 L into the lungs as needed (PRN as needed at night).     pantoprazole (PROTONIX) 40 MG tablet TAKE (1) TABLET BY MOUTH ONCE DAILY. 90 tablet 0   potassium chloride (KLOR-CON) 10 MEQ tablet Take 1 tablet (10 mEq total) by mouth daily. Take While taking Lasix/furosemide 30 tablet 2   primidone (MYSOLINE) 50 MG tablet Take 50 mg by mouth at bedtime.     PROAIR HFA 108 (90 Base) MCG/ACT inhaler Inhale 1 puff into the lungs every 4 (four) hours as needed for wheezing or shortness of breath.      ranolazine (RANEXA) 500 MG 12 hr tablet Take 1 tablet (500 mg total) by mouth 2 (two) times daily. 60 tablet 8   rosuvastatin (CRESTOR) 40 MG tablet Take 1 tablet (40 mg total) by mouth daily. 90 tablet 3   senna-docusate (SENOKOT-S) 8.6-50 MG tablet Take 1 tablet by mouth at bedtime. (Patient taking differently: Take 1-2 tablets by mouth daily as needed (constipation.).) 30 tablet 0   ticagrelor (BRILINTA) 60 MG TABS tablet Take 1 tablet (60 mg total)  by mouth 2 (two) times daily. 60 tablet 6   VITAMIN D, CHOLECALCIFEROL, PO Take 1 tablet by mouth daily.     vitamin E 180 MG (400 UNITS) capsule Take 400 Units by mouth daily.     No current facility-administered medications for this visit.     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   Past Surgical History:  Procedure Laterality Date   APPENDECTOMY     BIOPSY  02/24/2022   Procedure: BIOPSY;  Surgeon: Rush Landmark Telford Nab., MD;  Location: [facility];  Service: Gastroenterology;;   CARDIAC CATHETERIZATION  2010   stents x2.   CATARACT EXTRACTION     CHOLECYSTECTOMY     COLONOSCOPY  2011   COLONOSCOPY N/A 10/19/2014   Procedure: COLONOSCOPY;  Surgeon: Rogene Houston, MD;  [facility];  Service: Endoscopy;  Laterality: N/A;  1030   CORONARY STENT INTERVENTION N/A 05/28/2021   Procedure: CORONARY STENT INTERVENTION;  Surgeon: Leonie Man, MD;  Location: [place] [facility];  Service: Cardiovascular;  Laterality: N/A;   ESOPHAGEAL DILATION N/A 05/02/2020   Procedure: ESOPHAGEAL DILATION;  Surgeon: Rogene Houston, MD;  [facility];  Service: Gastroenterology;  Laterality: N/A;   ESOPHAGOGASTRODUODENOSCOPY N/A 10/26/2014   Procedure: ESOPHAGOGASTRODUODENOSCOPY (EGD);  Surgeon: Rogene Houston, MD;  [facility];  Service: Endoscopy;  Laterality: N/A;  40 - Dr. has lunch and learn   ESOPHAGOGASTRODUODENOSCOPY N/A 02/24/2022   Procedure: ESOPHAGOGASTRODUODENOSCOPY (EGD);  Surgeon: Irving Copas., MD;  Location: [facility];  Service: Gastroenterology;  Laterality: N/A;   ESOPHAGOGASTRODUODENOSCOPY (EGD) WITH PROPOFOL N/A 05/02/2020   Procedure: ESOPHAGOGASTRODUODENOSCOPY (EGD) WITH PROPOFOL;  Surgeon: Rogene Houston, MD;  [facility];  Service: Gastroenterology;  Laterality: N/A;  250   Esophagogastroduodenoscopy with esophageal dilation  2004, 2006, 2007   EUS N/A 02/24/2022   Procedure: UPPER ENDOSCOPIC ULTRASOUND (EUS) LINEAR;  Surgeon: Irving Copas., MD;  [facility];  Service: Gastroenterology;  Laterality: N/A;   FINE NEEDLE ASPIRATION  02/24/2022   Procedure: FINE NEEDLE ASPIRATION;  Surgeon: Rush Landmark Telford Nab., MD;  Location: [facility];  Service: Gastroenterology;;  red path sent   GIVENS CAPSULE STUDY N/A 10/31/2014   Procedure: GIVENS CAPSULE STUDY;  Surgeon: Rogene Houston, MD;  [facility];  Service: Endoscopy;  Laterality: N/A;  730 -- pacemaker--needs monitoring--outpatient bed   INSERT / REPLACE / REMOVE PACEMAKER  2010   INTRAVASCULAR ULTRASOUND/IVUS N/A 05/28/2021   Procedure: Intravascular Ultrasound/IVUS;  Surgeon: Leonie Man, MD;  Location: [facility] [facility];  Service: Cardiovascular;  Laterality: N/A;  LEFT HEART CATH AND CORONARY ANGIOGRAPHY N/A 03/21/2021   Procedure: LEFT HEART CATH AND CORONARY ANGIOGRAPHY;  Surgeon: Belva Crome, MD;  Location: [facility] [facility];  Service: Cardiovascular;  Laterality: N/A;   LEFT HEART CATHETERIZATION WITH CORONARY ANGIOGRAM N/A 11/17/2011   Procedure: LEFT HEART CATHETERIZATION WITH CORONARY ANGIOGRAM;  Surgeon: Sherren Mocha, MD;  Location: [facility] CATH LAB;  Service: Cardiovascular;  Laterality: N/A;   LEFT HEART CATHETERIZATION WITH CORONARY ANGIOGRAM N/A 09/26/2014   Procedure: LEFT HEART CATHETERIZATION WITH CORONARY ANGIOGRAM;  Surgeon: Leonie Man, MD;  Location: [facility] LAB;  Service: Cardiovascular;  Laterality: N/A;   Right rotator cuff repair     SAVORY DILATION N/A 02/24/2022   Procedure: SAVORY DILATION;  Surgeon: Rush Landmark Telford Nab., MD;  [facility];  Service: Gastroenterology;  Laterality: N/A;   SHOULDER ACROMIOPLASTY Right 05/30/2015   Procedure: RIGHT SHOULDER ACROMIOPLASTY;  Surgeon: Carole Civil, MD;  [facility];  Service: Orthopedics;  Laterality: Right;   SHOULDER OPEN ROTATOR CUFF REPAIR Right 05/30/2015   Procedure: OPEN ROTATOR CUFF REPAIR RIGHT SHOULDER;  Surgeon: Carole Civil, MD;  [facility];   Service: Orthopedics;  Laterality: Right;   SHOULDER OPEN ROTATOR CUFF REPAIR Left 10/22/2016   Procedure: ROTATOR CUFF REPAIR SHOULDER OPEN;  Surgeon: Carole Civil, MD;  [facility];  Service: Orthopedics;  Laterality: Left;   Subxiphoid pericardial window  11/10   VAGINAL HYSTERECTOMY       Family History  Problem Relation Age of Onset   Cancer Mother        Colon    Coronary artery disease Sister    Coronary artery disease Brother    Arthritis Other    Lung disease Other    Asthma Other      Social History   Socioeconomic History   Marital status: Widowed    Spouse name: Not on file   Number of children: 1   Years of education: Not on file   Highest education level: 8th grade  Occupational History   Occupation: Retired    Fish farm manager: RETIRED  Tobacco Use   Smoking status: Former    Packs/day: 2.00    Years: 10.00    Total pack years: 20.00    Types: Cigarettes    Quit date: 05/23/1989    Years since quitting: 33.1    Passive exposure: Past   Smokeless tobacco: Never  Vaping Use   Vaping Use: Never used  Substance and Sexual Activity   Alcohol use: No    Alcohol/week: 0.0 standard drinks of alcohol   Drug use: No   Sexual activity: Never  Other Topics Concern   Not on file  Social History Narrative   She lives alone, has adopted daughter.   Social Determinants of Health   Financial Resource Strain: Not on file  Food Insecurity: No Food Insecurity (06/30/2022)   Hunger Vital Sign    Worried About Running Out of Food in the Last Year: Never true    Ran Out of Food in the Last Year: Never true  Transportation Needs: No Transportation Needs (06/30/2022)   PRAPARE - Hydrologist (Medical): No    Lack of Transportation (Non-Medical): No  Physical Activity: Not on file  Stress: Not on file  Social Connections: Not on file  Intimate Partner Violence: Not At Risk (06/30/2022)   Humiliation, Afraid, Rape, and Kick  questionnaire    Fear of Current or Ex-Partner: No  Emotionally Abused: No    Physically Abused: No    Sexually Abused: No     BP 138/78   Pulse 62   Ht 5' 5.5" (1.664 m)   Wt 140 lb (63.5 kg)   SpO2 94%   BMI 22.94 kg/m   Physical Exam:  Well appearing NAD HEENT: Unremarkable Neck:  No JVD, no thyromegally Lymphatics:  No adenopathy Back:  No CVA tenderness Lungs:  Clear with no wheezes HEART:  Regular rate rhythm, no murmurs, no rubs, no clicks Abd:  soft, positive bowel sounds, no organomegally, no rebound, no guarding Ext:  2 plus pulses, no edema, no cyanosis, no clubbing Skin:  No rashes no nodules Neuro:  CN II through XII intact, motor grossly intact  DEVICE  Normal device function.  See PaceArt for details.   Assess/Plan:  1.Atrial fib - she is maintaining NSR almost 99% of the time. I have asked her to continue her current dose of amio. 2. PPM -her medtronic DDD PM is working normally. She has reached ERI and will undergo gen change out tomorrow. 3. Coags - she refuses an [place]. She will hold brilinta today and restart in a couple of days. 4. HTN - her bp is fairly well controlled. She is encouraged to avoid salty foods.   Carleene Overlie Katrina Rog,MD

## 2022-07-15 NOTE — Patient Instructions (Signed)
Medication Instructions:  Your physician recommends that you continue on your current medications as directed. Please refer to the Current Medication list given to you today.   Hold Brilinta on today and Tomorrow   *If you need a refill on your cardiac medications before your next appointment, please call your pharmacy*   Lab Work: Your physician recommends that you return for lab work in: Today   If you have labs (blood work) drawn today and your tests are completely normal, you will receive your results only by: [place] (if you have MyChart) OR A paper copy in the mail If you have any lab test that is abnormal or we need to change your treatment, we will call you to review the results.   Testing/Procedures: Your physician has recommended that you have a pacemaker inserted. A pacemaker is a small device that is placed under the skin of your chest or abdomen to help control abnormal heart rhythms. This device uses electrical pulses to prompt the heart to beat at a normal rate. Pacemakers are used to treat heart rhythms that are too slow. Wire (leads) are attached to the pacemaker that goes into the chambers of you heart. This is done in the hospital and usually requires and overnight stay. Please see the instruction sheet given to you today for more information.    Follow-Up: At [facility], you and your health needs are our priority.  As part of our continuing mission to provide you with exceptional heart care, we have created designated Provider Care Teams.  These Care Teams include your primary Cardiologist (physician) and Advanced Practice Providers (APPs -  Physician Assistants and Nurse Practitioners) who all work together to provide you with the care you need, when you need it.  We recommend signing up for the patient portal called "MyChart".  Sign up information is provided on this After Visit Summary.  MyChart is used to connect with patients for Virtual Visits  (Telemedicine).  Patients are able to view lab/test results, encounter notes, upcoming appointments, etc.  Non-urgent messages can be sent to your provider as well.   To learn more about what you can do with MyChart, go to [email or web address].    Your next appointment:   3 month(s)  The format for your next appointment:   In Person  Provider:   [name], MD    Other Instructions    Implantable Device Instructions    [name]  07/15/2022  You are scheduled for a PPM generator change on Wednesday, December 13 with Dr. [name].  1. Pre procedure Lab testing:  [facility]  [address] [place], [place]     2. Please arrive at the Main Entrance A at [facility]: [place], [place] [address] on December 13 at 11:00 AM (This time is two hours before your procedure to ensure your preparation). Free valet parking service is available. You will check in at ADMITTING. The support person will be asked to wait in the waiting room.  It is OK to have someone drop you off and come back when you are ready to be discharged.        Special note: Every effort is made to have your procedure done on time. Please understand that emergencies sometimes delay  scheduled procedures.  3.  No eating or drinking after midnight prior to procedure.     4.  Medication instructions:  On the morning of your procedure hold  your Brilinta Geneticist, molecular) for 1 day(s) prior to your procedure. Your last dose will be Tuesday, December 12, AM dose.   5.  The night before your procedure and the morning of your procedure scrub your neck/chest with CHG surgical scrub.  See instruction letter.  6. Plan to go home the same day, you will only stay overnight if medically necessary. 7.  You MUST have a responsible adult to drive you home. 8.   An adult MUST be with you the first 24 hours after you arrive home. 31..  Bring a current list of your medications, and the last time and date  medication taken. 10. Bring ID and current insurance cards. 11. .Please wear clothes that are easy to get on and off and wear slip-on shoes.    You will follow up with the [facility] clinic 10-14 days after your procedure.  You will follow up with Dr. [name] 91 days after your procedure.  These appointments will be made for you.   * If you have ANY questions after you get home, please call the office at [phone] or send a MyChart message.  FYI: For your safety, and to allow [place] to monitor your vital signs accurately during the surgery/procedure we request that if you have artificial nails, gel coating, SNS etc. Please have those removed prior to your surgery/procedure. Not having the nail coverings /polish removed may result in cancellation or delay of your surgery/procedure.    [facility] - Preparing For Surgery    Before surgery, you can play an important role. Because skin is not sterile, your skin needs to be as free of germs as possible. You can reduce the number of germs on your skin by washing with CHG (chlorahexidine gluconate) Soap before surgery.  CHG is an antiseptic cleaner which kills germs and bonds with the skin to continue killing germs even after washing.  Please do not use if you have an allergy to CHG or antibacterial soaps.  If your skin becomes reddened/irritated stop using the CHG.   Do not shave (including legs and underarms) for at least 48 hours prior to first CHG shower.  It is OK to shave your face.  Please follow these instructions carefully:  1.  Shower the night before surgery and the morning of surgery with CHG.  2.  If you choose to wash your hair, wash your hair first as usual with your normal shampoo.  3.  After you shampoo, rinse your hair and body thoroughly to remove the shampoo.  4.  Use CHG as you would any other liquid soap.  You can apply CHG directly to the skin and wash gently with a clean washcloth. 5.  Apply the CHG Soap to your  body ONLY FROM THE NECK DOWN.  Do not use on open wounds or open sores.  Avoid contact with your eyes, ears, mouth and genitals (private parts).  Wash genitals (private parts) with your normal soap.  6.  Wash thoroughly, paying special attention to the area where your surgery will be performed.  7.  Thoroughly rinse your body with warm water from the neck down.   8.  DO NOT shower/wash with your normal soap after using and rinsing off the CHG soap.  9.  Pat yourself dry with a clean towel.           10.  Wear clean pajamas.           11.  Place clean sheets  on your bed the night of your first shower and do not sleep with pets.  Day of Surgery: Do not apply any deodorants/lotions.  Please wear clean clothes to the hospital/surgery center.   Thank you for choosing [facility]!   Important Information About Sugar

## 2022-07-15 NOTE — Pre-Procedure Instructions (Signed)
Instructed patient on the following items: Arrival time 1100 Nothing to eat or drink after midnight No meds AM of procedure Responsible person to drive you home and stay with you for 24 hrs Wash with special soap night before and morning of procedure  

## 2022-07-16 ENCOUNTER — Encounter (HOSPITAL_COMMUNITY)
Admission: RE | Disposition: A | Discharge status: Home / Self Care | Payer: Self-pay | Source: Home / Self Care | Attending: Internal Medicine

## 2022-07-16 ENCOUNTER — Other Ambulatory Visit: Payer: Self-pay

## 2022-07-16 ENCOUNTER — Ambulatory Visit (HOSPITAL_COMMUNITY)
Admission: RE | Admit: 2022-07-16 | Discharge: 2022-07-16 | Disposition: A | Discharge status: Home / Self Care | Payer: Medicare Other | Attending: Internal Medicine | Admitting: Internal Medicine

## 2022-07-16 DIAGNOSIS — I495 Sick sinus syndrome: Secondary | ICD-10-CM | POA: Insufficient documentation

## 2022-07-16 DIAGNOSIS — I10 Essential (primary) hypertension: Secondary | ICD-10-CM | POA: Diagnosis not present

## 2022-07-16 DIAGNOSIS — Z45010 Encounter for checking and testing of cardiac pacemaker pulse generator [battery]: Secondary | ICD-10-CM | POA: Diagnosis not present

## 2022-07-16 DIAGNOSIS — I459 Conduction disorder, unspecified: Secondary | ICD-10-CM | POA: Insufficient documentation

## 2022-07-16 DIAGNOSIS — I4819 Other persistent atrial fibrillation: Secondary | ICD-10-CM | POA: Insufficient documentation

## 2022-07-16 DIAGNOSIS — Z87891 Personal history of nicotine dependence: Secondary | ICD-10-CM | POA: Insufficient documentation

## 2022-07-16 DIAGNOSIS — I442 Atrioventricular block, complete: Secondary | ICD-10-CM | POA: Diagnosis not present

## 2022-07-16 HISTORY — PX: PPM GENERATOR CHANGEOUT: EP1233

## 2022-07-16 SURGERY — PPM GENERATOR CHANGEOUT

## 2022-07-16 MED ORDER — SODIUM CHLORIDE 0.9 % IV SOLN
INTRAVENOUS | Status: AC
  Filled 2022-07-16: qty 2

## 2022-07-16 MED ORDER — FENTANYL CITRATE (PF) 100 MCG/2ML IJ SOLN
INTRAMUSCULAR | Status: AC
  Filled 2022-07-16: qty 2

## 2022-07-16 MED ORDER — LIDOCAINE HCL (PF) 1 % IJ SOLN
INTRAMUSCULAR | Status: AC
  Filled 2022-07-16: qty 60

## 2022-07-16 MED ORDER — LIDOCAINE HCL (PF) 1 % IJ SOLN
INTRAMUSCULAR | Status: DC | PRN
  Administered 2022-07-16: 60 mL

## 2022-07-16 MED ORDER — SODIUM CHLORIDE 0.9 % IV SOLN
80.0000 mg | INTRAVENOUS | Status: AC
  Administered 2022-07-16: 80 mg

## 2022-07-16 MED ORDER — CEFAZOLIN SODIUM-DEXTROSE 2-4 GM/100ML-% IV SOLN
2.0000 g | INTRAVENOUS | Status: AC
  Administered 2022-07-16: 2 g via INTRAVENOUS

## 2022-07-16 MED ORDER — CHLORHEXIDINE GLUCONATE 4 % EX LIQD
4.0000 | Freq: Once | CUTANEOUS | Status: DC
  Filled 2022-07-16: qty 60

## 2022-07-16 MED ORDER — POVIDONE-IODINE 10 % EX SWAB: 2.0000 | Freq: Once | CUTANEOUS | Status: DC

## 2022-07-16 MED ORDER — SODIUM CHLORIDE 0.9 % IV SOLN: INTRAVENOUS | Status: DC

## 2022-07-16 MED ORDER — MIDAZOLAM HCL 5 MG/5ML IJ SOLN
INTRAMUSCULAR | Status: AC
  Filled 2022-07-16: qty 5

## 2022-07-16 MED ORDER — CEFAZOLIN SODIUM-DEXTROSE 2-4 GM/100ML-% IV SOLN
INTRAVENOUS | Status: AC
  Filled 2022-07-16: qty 100

## 2022-07-16 MED ORDER — ACETAMINOPHEN 325 MG PO TABS: 325.0000 mg | ORAL_TABLET | ORAL | Status: DC | PRN

## 2022-07-16 SURGICAL SUPPLY — 7 items
CABLE SURGICAL S-101-97-12 (CABLE) ×1 IMPLANT
IPG PACE AZUR XT DR MRI W1DR01 (Pacemaker) IMPLANT
PACE AZURE XT DR MRI W1DR01 (Pacemaker) ×1 IMPLANT
PAD DEFIB RADIO PHYSIO CONN (PAD) ×1 IMPLANT
POUCH AIGIS-R ANTIBACT PPM (Mesh General) ×1 IMPLANT
POUCH AIGIS-R ANTIBACT PPM MED (Mesh General) IMPLANT
TRAY PACEMAKER INSERTION (PACKS) ×1 IMPLANT

## 2022-07-16 NOTE — Progress Notes (Signed)
Per report from RN, MD is aware of pts current VS (high BP).. No further interventions to be done at this time.

## 2022-07-16 NOTE — Interval H&P Note (Signed)
History and Physical Interval Note:  07/16/2022 2:42 PM  [name]  has presented today for surgery, with the diagnosis of ERI.  The various methods of treatment have been discussed with the patient and family. After consideration of risks, benefits and other options for treatment, the patient has consented to  Procedure(s): PPM GENERATOR CHANGEOUT (N/A) as a surgical intervention.  The patient's history has been reviewed, patient examined, no change in status, stable for surgery.  I have reviewed the patient's chart and labs.  Questions were answered to the patient's satisfaction.     [name]

## 2022-07-16 NOTE — Progress Notes (Signed)
Pt and neighbor received d/c instructions, written and verbal. All questions and concerns addressed. Dressing to procedure site intact, no bleeding or discomfort noted. Pt left w/ all devices and belongings. In stable condition upon d/c.

## 2022-07-16 NOTE — Progress Notes (Signed)
Clarified w/ Jonni Sanger, [place] that pt does not need to be seen by MD, receive CXR, or EKG prior to d/c. Verified w/ Charge Nurse. Will continue to monitor

## 2022-07-17 ENCOUNTER — Encounter (HOSPITAL_COMMUNITY): Payer: Self-pay | Admitting: Internal Medicine

## 2022-07-17 DIAGNOSIS — I4891 Unspecified atrial fibrillation: Secondary | ICD-10-CM | POA: Diagnosis not present

## 2022-07-17 DIAGNOSIS — I241 Dressler's syndrome: Secondary | ICD-10-CM | POA: Diagnosis not present

## 2022-07-17 DIAGNOSIS — G709 Myoneural disorder, unspecified: Secondary | ICD-10-CM | POA: Diagnosis not present

## 2022-07-17 DIAGNOSIS — H04553 Acquired stenosis of bilateral nasolacrimal duct: Secondary | ICD-10-CM | POA: Diagnosis not present

## 2022-07-17 DIAGNOSIS — Z8616 Personal history of COVID-19: Secondary | ICD-10-CM | POA: Diagnosis not present

## 2022-07-17 DIAGNOSIS — I2510 Atherosclerotic heart disease of native coronary artery without angina pectoris: Secondary | ICD-10-CM | POA: Diagnosis not present

## 2022-07-17 DIAGNOSIS — I5033 Acute on chronic diastolic (congestive) heart failure: Secondary | ICD-10-CM | POA: Diagnosis not present

## 2022-07-17 DIAGNOSIS — I4581 Long QT syndrome: Secondary | ICD-10-CM | POA: Diagnosis not present

## 2022-07-17 DIAGNOSIS — M549 Dorsalgia, unspecified: Secondary | ICD-10-CM | POA: Diagnosis not present

## 2022-07-17 DIAGNOSIS — K219 Gastro-esophageal reflux disease without esophagitis: Secondary | ICD-10-CM | POA: Diagnosis not present

## 2022-07-17 DIAGNOSIS — M75122 Complete rotator cuff tear or rupture of left shoulder, not specified as traumatic: Secondary | ICD-10-CM | POA: Diagnosis not present

## 2022-07-17 DIAGNOSIS — Z955 Presence of coronary angioplasty implant and graft: Secondary | ICD-10-CM | POA: Diagnosis not present

## 2022-07-17 DIAGNOSIS — J441 Chronic obstructive pulmonary disease with (acute) exacerbation: Secondary | ICD-10-CM | POA: Diagnosis not present

## 2022-07-17 DIAGNOSIS — I3139 Other pericardial effusion (noninflammatory): Secondary | ICD-10-CM | POA: Diagnosis not present

## 2022-07-17 DIAGNOSIS — I495 Sick sinus syndrome: Secondary | ICD-10-CM | POA: Diagnosis not present

## 2022-07-17 DIAGNOSIS — I110 Hypertensive heart disease with heart failure: Secondary | ICD-10-CM | POA: Diagnosis not present

## 2022-07-17 DIAGNOSIS — J9611 Chronic respiratory failure with hypoxia: Secondary | ICD-10-CM | POA: Diagnosis not present

## 2022-07-17 DIAGNOSIS — Z7984 Long term (current) use of oral hypoglycemic drugs: Secondary | ICD-10-CM | POA: Diagnosis not present

## 2022-07-17 DIAGNOSIS — K5900 Constipation, unspecified: Secondary | ICD-10-CM | POA: Diagnosis not present

## 2022-07-17 DIAGNOSIS — K869 Disease of pancreas, unspecified: Secondary | ICD-10-CM | POA: Diagnosis not present

## 2022-07-17 DIAGNOSIS — G8929 Other chronic pain: Secondary | ICD-10-CM | POA: Diagnosis not present

## 2022-07-17 DIAGNOSIS — H9190 Unspecified hearing loss, unspecified ear: Secondary | ICD-10-CM | POA: Diagnosis not present

## 2022-07-17 DIAGNOSIS — Z7951 Long term (current) use of inhaled steroids: Secondary | ICD-10-CM | POA: Diagnosis not present

## 2022-07-24 DIAGNOSIS — I110 Hypertensive heart disease with heart failure: Secondary | ICD-10-CM | POA: Diagnosis not present

## 2022-07-24 DIAGNOSIS — H9190 Unspecified hearing loss, unspecified ear: Secondary | ICD-10-CM | POA: Diagnosis not present

## 2022-07-24 DIAGNOSIS — I4581 Long QT syndrome: Secondary | ICD-10-CM | POA: Diagnosis not present

## 2022-07-24 DIAGNOSIS — H04553 Acquired stenosis of bilateral nasolacrimal duct: Secondary | ICD-10-CM | POA: Diagnosis not present

## 2022-07-24 DIAGNOSIS — Z955 Presence of coronary angioplasty implant and graft: Secondary | ICD-10-CM | POA: Diagnosis not present

## 2022-07-24 DIAGNOSIS — Z8616 Personal history of COVID-19: Secondary | ICD-10-CM | POA: Diagnosis not present

## 2022-07-24 DIAGNOSIS — G709 Myoneural disorder, unspecified: Secondary | ICD-10-CM | POA: Diagnosis not present

## 2022-07-24 DIAGNOSIS — J441 Chronic obstructive pulmonary disease with (acute) exacerbation: Secondary | ICD-10-CM | POA: Diagnosis not present

## 2022-07-24 DIAGNOSIS — K219 Gastro-esophageal reflux disease without esophagitis: Secondary | ICD-10-CM | POA: Diagnosis not present

## 2022-07-24 DIAGNOSIS — I241 Dressler's syndrome: Secondary | ICD-10-CM | POA: Diagnosis not present

## 2022-07-24 DIAGNOSIS — I5033 Acute on chronic diastolic (congestive) heart failure: Secondary | ICD-10-CM | POA: Diagnosis not present

## 2022-07-24 DIAGNOSIS — K869 Disease of pancreas, unspecified: Secondary | ICD-10-CM | POA: Diagnosis not present

## 2022-07-24 DIAGNOSIS — K5900 Constipation, unspecified: Secondary | ICD-10-CM | POA: Diagnosis not present

## 2022-07-24 DIAGNOSIS — I495 Sick sinus syndrome: Secondary | ICD-10-CM | POA: Diagnosis not present

## 2022-07-24 DIAGNOSIS — I3139 Other pericardial effusion (noninflammatory): Secondary | ICD-10-CM | POA: Diagnosis not present

## 2022-07-24 DIAGNOSIS — Z7984 Long term (current) use of oral hypoglycemic drugs: Secondary | ICD-10-CM | POA: Diagnosis not present

## 2022-07-24 DIAGNOSIS — I4891 Unspecified atrial fibrillation: Secondary | ICD-10-CM | POA: Diagnosis not present

## 2022-07-24 DIAGNOSIS — I2510 Atherosclerotic heart disease of native coronary artery without angina pectoris: Secondary | ICD-10-CM | POA: Diagnosis not present

## 2022-07-24 DIAGNOSIS — J9611 Chronic respiratory failure with hypoxia: Secondary | ICD-10-CM | POA: Diagnosis not present

## 2022-07-24 DIAGNOSIS — G8929 Other chronic pain: Secondary | ICD-10-CM | POA: Diagnosis not present

## 2022-07-24 DIAGNOSIS — M549 Dorsalgia, unspecified: Secondary | ICD-10-CM | POA: Diagnosis not present

## 2022-07-24 DIAGNOSIS — Z7951 Long term (current) use of inhaled steroids: Secondary | ICD-10-CM | POA: Diagnosis not present

## 2022-07-24 DIAGNOSIS — M75122 Complete rotator cuff tear or rupture of left shoulder, not specified as traumatic: Secondary | ICD-10-CM | POA: Diagnosis not present

## 2022-07-29 DIAGNOSIS — R0602 Shortness of breath: Secondary | ICD-10-CM | POA: Diagnosis not present

## 2022-07-29 DIAGNOSIS — I2510 Atherosclerotic heart disease of native coronary artery without angina pectoris: Secondary | ICD-10-CM | POA: Diagnosis not present

## 2022-08-01 ENCOUNTER — Ambulatory Visit: Payer: Medicare Other | Attending: Cardiovascular Disease

## 2022-08-01 DIAGNOSIS — Z7951 Long term (current) use of inhaled steroids: Secondary | ICD-10-CM | POA: Diagnosis not present

## 2022-08-01 DIAGNOSIS — I495 Sick sinus syndrome: Secondary | ICD-10-CM

## 2022-08-01 DIAGNOSIS — I110 Hypertensive heart disease with heart failure: Secondary | ICD-10-CM | POA: Diagnosis not present

## 2022-08-01 DIAGNOSIS — H9190 Unspecified hearing loss, unspecified ear: Secondary | ICD-10-CM | POA: Diagnosis not present

## 2022-08-01 DIAGNOSIS — I2510 Atherosclerotic heart disease of native coronary artery without angina pectoris: Secondary | ICD-10-CM | POA: Diagnosis not present

## 2022-08-01 DIAGNOSIS — M75122 Complete rotator cuff tear or rupture of left shoulder, not specified as traumatic: Secondary | ICD-10-CM | POA: Diagnosis not present

## 2022-08-01 DIAGNOSIS — G8929 Other chronic pain: Secondary | ICD-10-CM | POA: Diagnosis not present

## 2022-08-01 DIAGNOSIS — K869 Disease of pancreas, unspecified: Secondary | ICD-10-CM | POA: Diagnosis not present

## 2022-08-01 DIAGNOSIS — I4581 Long QT syndrome: Secondary | ICD-10-CM | POA: Diagnosis not present

## 2022-08-01 DIAGNOSIS — I5033 Acute on chronic diastolic (congestive) heart failure: Secondary | ICD-10-CM | POA: Diagnosis not present

## 2022-08-01 DIAGNOSIS — Z955 Presence of coronary angioplasty implant and graft: Secondary | ICD-10-CM | POA: Diagnosis not present

## 2022-08-01 DIAGNOSIS — Z7984 Long term (current) use of oral hypoglycemic drugs: Secondary | ICD-10-CM | POA: Diagnosis not present

## 2022-08-01 DIAGNOSIS — K5900 Constipation, unspecified: Secondary | ICD-10-CM | POA: Diagnosis not present

## 2022-08-01 DIAGNOSIS — I4891 Unspecified atrial fibrillation: Secondary | ICD-10-CM | POA: Diagnosis not present

## 2022-08-01 DIAGNOSIS — I241 Dressler's syndrome: Secondary | ICD-10-CM | POA: Diagnosis not present

## 2022-08-01 DIAGNOSIS — I3139 Other pericardial effusion (noninflammatory): Secondary | ICD-10-CM | POA: Diagnosis not present

## 2022-08-01 DIAGNOSIS — Z8616 Personal history of COVID-19: Secondary | ICD-10-CM | POA: Diagnosis not present

## 2022-08-01 DIAGNOSIS — G709 Myoneural disorder, unspecified: Secondary | ICD-10-CM | POA: Diagnosis not present

## 2022-08-01 DIAGNOSIS — H04553 Acquired stenosis of bilateral nasolacrimal duct: Secondary | ICD-10-CM | POA: Diagnosis not present

## 2022-08-01 DIAGNOSIS — M549 Dorsalgia, unspecified: Secondary | ICD-10-CM | POA: Diagnosis not present

## 2022-08-01 DIAGNOSIS — J441 Chronic obstructive pulmonary disease with (acute) exacerbation: Secondary | ICD-10-CM | POA: Diagnosis not present

## 2022-08-01 DIAGNOSIS — K219 Gastro-esophageal reflux disease without esophagitis: Secondary | ICD-10-CM | POA: Diagnosis not present

## 2022-08-01 DIAGNOSIS — J9611 Chronic respiratory failure with hypoxia: Secondary | ICD-10-CM | POA: Diagnosis not present

## 2022-08-01 LAB — CUP PACEART INCLINIC DEVICE CHECK
Battery Remaining Longevity: 149 mo
Battery Voltage: 3.22 V
Brady Statistic AP VP Percent: 51.82 %
Brady Statistic AP VS Percent: 29.32 %
Brady Statistic AS VP Percent: 0.01 %
Brady Statistic AS VS Percent: 18.85 %
Brady Statistic RA Percent Paced: 78.64 %
Brady Statistic RV Percent Paced: 51.83 %
Date Time Interrogation Session: 20231229153343
Implantable Lead Connection Status: 753985
Implantable Lead Connection Status: 753985
Implantable Lead Implant Date: 20100526
Implantable Lead Implant Date: 20100526
Implantable Lead Location: 753859
Implantable Lead Location: 753860
Implantable Lead Model: 5076
Implantable Lead Model: 5076
Implantable Pulse Generator Implant Date: 20231213
Lead Channel Impedance Value: 323 Ohm
Lead Channel Impedance Value: 323 Ohm
Lead Channel Impedance Value: 380 Ohm
Lead Channel Impedance Value: 399 Ohm
Lead Channel Pacing Threshold Amplitude: 0.75 V
Lead Channel Pacing Threshold Amplitude: 1.125 V
Lead Channel Pacing Threshold Pulse Width: 0.4 ms
Lead Channel Pacing Threshold Pulse Width: 0.4 ms
Lead Channel Sensing Intrinsic Amplitude: 2.125 mV
Lead Channel Sensing Intrinsic Amplitude: 2.375 mV
Lead Channel Sensing Intrinsic Amplitude: 5.25 mV
Lead Channel Sensing Intrinsic Amplitude: 5.375 mV
Lead Channel Setting Pacing Amplitude: 1.5 V
Lead Channel Setting Pacing Amplitude: 2 V
Lead Channel Setting Pacing Pulse Width: 0.4 ms
Lead Channel Setting Sensing Sensitivity: 1.2 mV
Zone Setting Status: 755011

## 2022-08-01 NOTE — Patient Instructions (Signed)
   After Your Pacemaker   Monitor your pacemaker site for redness, swelling, and drainage. Call the device clinic at [phone] if you experience these symptoms or fever/chills.  Wash incision site twice a day with soap and water, gently massaging area using a clean cloth and clean towel each time. Wear clean clothes every day and clean pajamas.    You may drive, unless driving has been restricted by your healthcare providers.  Your Pacemaker is MRI compatible.  Remote monitoring is used to monitor your pacemaker from home. This monitoring is scheduled every 91 days by our office. It allows [place] to keep an eye on the functioning of your device to ensure it is working properly. You will routinely see your Electrophysiologist annually (more often if necessary).

## 2022-08-01 NOTE — Progress Notes (Signed)
Wound check appointment. Steri-strips removed prior to OV, per patient home health nurse took steri-strips off the day after procedure. Wound without redness or edema Stitch noted at midline incision unable to clip. See pic in Epic. Patient insturcted to wash BID with clean cloth and massage site.  Normal device function. Thresholds, sensing, and impedances consistent with implant measurements. Device programmed at chronic ouputs. Histogram distribution appropriate for patient and level of activity. No mode switches or high ventricular rates noted. Patient educated about wound care, arm mobility, lifting restrictions. ROV 08/06/2022 for wound re-check.

## 2022-08-06 ENCOUNTER — Ambulatory Visit: Payer: Medicare Other

## 2022-08-07 ENCOUNTER — Other Ambulatory Visit: Payer: Self-pay | Admitting: Internal Medicine

## 2022-08-13 ENCOUNTER — Telehealth: Payer: Self-pay | Admitting: Cardiology

## 2022-08-13 NOTE — Telephone Encounter (Signed)
Patient would like to reschedule a Wound re-check/ clip stitch to be done in [place].  Patient stated she is hard of hearing and may not hear the phone on the first ring.

## 2022-08-13 NOTE — Telephone Encounter (Signed)
Pt scheduled in [place] 08/20/2022 for wound care.

## 2022-08-14 DIAGNOSIS — K869 Disease of pancreas, unspecified: Secondary | ICD-10-CM | POA: Diagnosis not present

## 2022-08-14 DIAGNOSIS — M75122 Complete rotator cuff tear or rupture of left shoulder, not specified as traumatic: Secondary | ICD-10-CM | POA: Diagnosis not present

## 2022-08-14 DIAGNOSIS — J441 Chronic obstructive pulmonary disease with (acute) exacerbation: Secondary | ICD-10-CM | POA: Diagnosis not present

## 2022-08-14 DIAGNOSIS — M549 Dorsalgia, unspecified: Secondary | ICD-10-CM | POA: Diagnosis not present

## 2022-08-14 DIAGNOSIS — I4891 Unspecified atrial fibrillation: Secondary | ICD-10-CM | POA: Diagnosis not present

## 2022-08-14 DIAGNOSIS — I241 Dressler's syndrome: Secondary | ICD-10-CM | POA: Diagnosis not present

## 2022-08-14 DIAGNOSIS — J9611 Chronic respiratory failure with hypoxia: Secondary | ICD-10-CM | POA: Diagnosis not present

## 2022-08-14 DIAGNOSIS — I3139 Other pericardial effusion (noninflammatory): Secondary | ICD-10-CM | POA: Diagnosis not present

## 2022-08-14 DIAGNOSIS — K5900 Constipation, unspecified: Secondary | ICD-10-CM | POA: Diagnosis not present

## 2022-08-14 DIAGNOSIS — H9190 Unspecified hearing loss, unspecified ear: Secondary | ICD-10-CM | POA: Diagnosis not present

## 2022-08-14 DIAGNOSIS — I4581 Long QT syndrome: Secondary | ICD-10-CM | POA: Diagnosis not present

## 2022-08-14 DIAGNOSIS — I5033 Acute on chronic diastolic (congestive) heart failure: Secondary | ICD-10-CM | POA: Diagnosis not present

## 2022-08-14 DIAGNOSIS — I495 Sick sinus syndrome: Secondary | ICD-10-CM | POA: Diagnosis not present

## 2022-08-14 DIAGNOSIS — G709 Myoneural disorder, unspecified: Secondary | ICD-10-CM | POA: Diagnosis not present

## 2022-08-14 DIAGNOSIS — I2510 Atherosclerotic heart disease of native coronary artery without angina pectoris: Secondary | ICD-10-CM | POA: Diagnosis not present

## 2022-08-14 DIAGNOSIS — I110 Hypertensive heart disease with heart failure: Secondary | ICD-10-CM | POA: Diagnosis not present

## 2022-08-14 DIAGNOSIS — Z8616 Personal history of COVID-19: Secondary | ICD-10-CM | POA: Diagnosis not present

## 2022-08-14 DIAGNOSIS — Z955 Presence of coronary angioplasty implant and graft: Secondary | ICD-10-CM | POA: Diagnosis not present

## 2022-08-14 DIAGNOSIS — Z7951 Long term (current) use of inhaled steroids: Secondary | ICD-10-CM | POA: Diagnosis not present

## 2022-08-14 DIAGNOSIS — K219 Gastro-esophageal reflux disease without esophagitis: Secondary | ICD-10-CM | POA: Diagnosis not present

## 2022-08-14 DIAGNOSIS — Z7984 Long term (current) use of oral hypoglycemic drugs: Secondary | ICD-10-CM | POA: Diagnosis not present

## 2022-08-14 DIAGNOSIS — H04553 Acquired stenosis of bilateral nasolacrimal duct: Secondary | ICD-10-CM | POA: Diagnosis not present

## 2022-08-14 DIAGNOSIS — G8929 Other chronic pain: Secondary | ICD-10-CM | POA: Diagnosis not present

## 2022-08-15 DIAGNOSIS — K219 Gastro-esophageal reflux disease without esophagitis: Secondary | ICD-10-CM | POA: Diagnosis not present

## 2022-08-15 DIAGNOSIS — I241 Dressler's syndrome: Secondary | ICD-10-CM | POA: Diagnosis not present

## 2022-08-15 DIAGNOSIS — H9190 Unspecified hearing loss, unspecified ear: Secondary | ICD-10-CM | POA: Diagnosis not present

## 2022-08-15 DIAGNOSIS — K5900 Constipation, unspecified: Secondary | ICD-10-CM | POA: Diagnosis not present

## 2022-08-15 DIAGNOSIS — I495 Sick sinus syndrome: Secondary | ICD-10-CM | POA: Diagnosis not present

## 2022-08-15 DIAGNOSIS — I4891 Unspecified atrial fibrillation: Secondary | ICD-10-CM | POA: Diagnosis not present

## 2022-08-15 DIAGNOSIS — M75122 Complete rotator cuff tear or rupture of left shoulder, not specified as traumatic: Secondary | ICD-10-CM | POA: Diagnosis not present

## 2022-08-15 DIAGNOSIS — H04553 Acquired stenosis of bilateral nasolacrimal duct: Secondary | ICD-10-CM | POA: Diagnosis not present

## 2022-08-15 DIAGNOSIS — G709 Myoneural disorder, unspecified: Secondary | ICD-10-CM | POA: Diagnosis not present

## 2022-08-15 DIAGNOSIS — Z7984 Long term (current) use of oral hypoglycemic drugs: Secondary | ICD-10-CM | POA: Diagnosis not present

## 2022-08-15 DIAGNOSIS — I110 Hypertensive heart disease with heart failure: Secondary | ICD-10-CM | POA: Diagnosis not present

## 2022-08-15 DIAGNOSIS — I3139 Other pericardial effusion (noninflammatory): Secondary | ICD-10-CM | POA: Diagnosis not present

## 2022-08-15 DIAGNOSIS — Z7951 Long term (current) use of inhaled steroids: Secondary | ICD-10-CM | POA: Diagnosis not present

## 2022-08-15 DIAGNOSIS — J441 Chronic obstructive pulmonary disease with (acute) exacerbation: Secondary | ICD-10-CM | POA: Diagnosis not present

## 2022-08-15 DIAGNOSIS — G8929 Other chronic pain: Secondary | ICD-10-CM | POA: Diagnosis not present

## 2022-08-15 DIAGNOSIS — I5033 Acute on chronic diastolic (congestive) heart failure: Secondary | ICD-10-CM | POA: Diagnosis not present

## 2022-08-15 DIAGNOSIS — K869 Disease of pancreas, unspecified: Secondary | ICD-10-CM | POA: Diagnosis not present

## 2022-08-15 DIAGNOSIS — I2510 Atherosclerotic heart disease of native coronary artery without angina pectoris: Secondary | ICD-10-CM | POA: Diagnosis not present

## 2022-08-15 DIAGNOSIS — Z955 Presence of coronary angioplasty implant and graft: Secondary | ICD-10-CM | POA: Diagnosis not present

## 2022-08-15 DIAGNOSIS — Z8616 Personal history of COVID-19: Secondary | ICD-10-CM | POA: Diagnosis not present

## 2022-08-15 DIAGNOSIS — M549 Dorsalgia, unspecified: Secondary | ICD-10-CM | POA: Diagnosis not present

## 2022-08-15 DIAGNOSIS — I4581 Long QT syndrome: Secondary | ICD-10-CM | POA: Diagnosis not present

## 2022-08-15 DIAGNOSIS — J9611 Chronic respiratory failure with hypoxia: Secondary | ICD-10-CM | POA: Diagnosis not present

## 2022-08-20 ENCOUNTER — Ambulatory Visit: Payer: 59 | Attending: Cardiology | Admitting: Internal Medicine

## 2022-08-20 ENCOUNTER — Encounter: Payer: Medicare Other | Admitting: Internal Medicine

## 2022-08-20 DIAGNOSIS — I495 Sick sinus syndrome: Secondary | ICD-10-CM

## 2022-08-20 NOTE — Patient Instructions (Signed)
Follow up as scheduled.  

## 2022-08-20 NOTE — Progress Notes (Signed)
   Nurse Visit   Date of Encounter: 08/20/2022 ID: SERENITI WAN, DOB 1936-04-28, MRN [id number]  PCP:  Arnoldo Lenis, [place] Prov[facility] Cardiologist:  Rozann Lesches, MD Electrophysiologist:  [name], MD {   Visit Details   VS:  There were no vitals taken for this visit. , BMI There is no height or weight on file to calculate BMI.  Wt Readings from Last 3 Encounters:  07/16/22 140 lb (63.5 kg)  07/15/22 140 lb (63.5 kg)  07/02/22 136 lb 11 oz (62 kg)     Reason for visit: recheck wound-remove stitch  Performed today: Pt seen in clinic today to remove stitch.  Wound site well healed.  Small open area where stitch remains from wound check.  Stitch clipped.  Site reviewed by Dr. Lovena Le.  Pt advised to call if any changes to wound site prior to follow up visit. Changes (medications, testing, etc.) : none Length of Visit: 5 minutes    Medications Adjustments/Labs and Tests Ordered: No orders of the defined types were placed in this encounter.  No orders of the defined types were placed in this encounter.    Signed, Damian Leavell, RN  08/20/2022 10:00 AM

## 2022-08-25 DIAGNOSIS — L282 Other prurigo: Secondary | ICD-10-CM | POA: Diagnosis not present

## 2022-08-25 DIAGNOSIS — I110 Hypertensive heart disease with heart failure: Secondary | ICD-10-CM | POA: Diagnosis not present

## 2022-08-25 DIAGNOSIS — K869 Disease of pancreas, unspecified: Secondary | ICD-10-CM | POA: Diagnosis not present

## 2022-08-25 DIAGNOSIS — G709 Myoneural disorder, unspecified: Secondary | ICD-10-CM | POA: Diagnosis not present

## 2022-08-25 DIAGNOSIS — I2510 Atherosclerotic heart disease of native coronary artery without angina pectoris: Secondary | ICD-10-CM | POA: Diagnosis not present

## 2022-08-25 DIAGNOSIS — I241 Dressler's syndrome: Secondary | ICD-10-CM | POA: Diagnosis not present

## 2022-08-25 DIAGNOSIS — Z8616 Personal history of COVID-19: Secondary | ICD-10-CM | POA: Diagnosis not present

## 2022-08-25 DIAGNOSIS — J449 Chronic obstructive pulmonary disease, unspecified: Secondary | ICD-10-CM | POA: Diagnosis not present

## 2022-08-25 DIAGNOSIS — I5033 Acute on chronic diastolic (congestive) heart failure: Secondary | ICD-10-CM | POA: Diagnosis not present

## 2022-08-25 DIAGNOSIS — Z7951 Long term (current) use of inhaled steroids: Secondary | ICD-10-CM | POA: Diagnosis not present

## 2022-08-25 DIAGNOSIS — K219 Gastro-esophageal reflux disease without esophagitis: Secondary | ICD-10-CM | POA: Diagnosis not present

## 2022-08-25 DIAGNOSIS — I5022 Chronic systolic (congestive) heart failure: Secondary | ICD-10-CM | POA: Diagnosis not present

## 2022-08-25 DIAGNOSIS — I4581 Long QT syndrome: Secondary | ICD-10-CM | POA: Diagnosis not present

## 2022-08-25 DIAGNOSIS — H04553 Acquired stenosis of bilateral nasolacrimal duct: Secondary | ICD-10-CM | POA: Diagnosis not present

## 2022-08-25 DIAGNOSIS — J9611 Chronic respiratory failure with hypoxia: Secondary | ICD-10-CM | POA: Diagnosis not present

## 2022-08-25 DIAGNOSIS — G8929 Other chronic pain: Secondary | ICD-10-CM | POA: Diagnosis not present

## 2022-08-25 DIAGNOSIS — Z7984 Long term (current) use of oral hypoglycemic drugs: Secondary | ICD-10-CM | POA: Diagnosis not present

## 2022-08-25 DIAGNOSIS — J441 Chronic obstructive pulmonary disease with (acute) exacerbation: Secondary | ICD-10-CM | POA: Diagnosis not present

## 2022-08-25 DIAGNOSIS — M549 Dorsalgia, unspecified: Secondary | ICD-10-CM | POA: Diagnosis not present

## 2022-08-25 DIAGNOSIS — Z955 Presence of coronary angioplasty implant and graft: Secondary | ICD-10-CM | POA: Diagnosis not present

## 2022-08-25 DIAGNOSIS — I4891 Unspecified atrial fibrillation: Secondary | ICD-10-CM | POA: Diagnosis not present

## 2022-08-25 DIAGNOSIS — M75122 Complete rotator cuff tear or rupture of left shoulder, not specified as traumatic: Secondary | ICD-10-CM | POA: Diagnosis not present

## 2022-08-25 DIAGNOSIS — Z950 Presence of cardiac pacemaker: Secondary | ICD-10-CM | POA: Diagnosis not present

## 2022-08-25 DIAGNOSIS — H9190 Unspecified hearing loss, unspecified ear: Secondary | ICD-10-CM | POA: Diagnosis not present

## 2022-08-25 DIAGNOSIS — I3139 Other pericardial effusion (noninflammatory): Secondary | ICD-10-CM | POA: Diagnosis not present

## 2022-08-25 DIAGNOSIS — I495 Sick sinus syndrome: Secondary | ICD-10-CM | POA: Diagnosis not present

## 2022-08-25 DIAGNOSIS — K5900 Constipation, unspecified: Secondary | ICD-10-CM | POA: Diagnosis not present

## 2022-08-28 DIAGNOSIS — K219 Gastro-esophageal reflux disease without esophagitis: Secondary | ICD-10-CM | POA: Diagnosis not present

## 2022-08-28 DIAGNOSIS — M75122 Complete rotator cuff tear or rupture of left shoulder, not specified as traumatic: Secondary | ICD-10-CM | POA: Diagnosis not present

## 2022-08-28 DIAGNOSIS — I5033 Acute on chronic diastolic (congestive) heart failure: Secondary | ICD-10-CM | POA: Diagnosis not present

## 2022-08-28 DIAGNOSIS — I4891 Unspecified atrial fibrillation: Secondary | ICD-10-CM | POA: Diagnosis not present

## 2022-08-28 DIAGNOSIS — J9611 Chronic respiratory failure with hypoxia: Secondary | ICD-10-CM | POA: Diagnosis not present

## 2022-08-28 DIAGNOSIS — Z8616 Personal history of COVID-19: Secondary | ICD-10-CM | POA: Diagnosis not present

## 2022-08-28 DIAGNOSIS — G8929 Other chronic pain: Secondary | ICD-10-CM | POA: Diagnosis not present

## 2022-08-28 DIAGNOSIS — J441 Chronic obstructive pulmonary disease with (acute) exacerbation: Secondary | ICD-10-CM | POA: Diagnosis not present

## 2022-08-28 DIAGNOSIS — I110 Hypertensive heart disease with heart failure: Secondary | ICD-10-CM | POA: Diagnosis not present

## 2022-08-28 DIAGNOSIS — I241 Dressler's syndrome: Secondary | ICD-10-CM | POA: Diagnosis not present

## 2022-08-28 DIAGNOSIS — K869 Disease of pancreas, unspecified: Secondary | ICD-10-CM | POA: Diagnosis not present

## 2022-08-28 DIAGNOSIS — I2510 Atherosclerotic heart disease of native coronary artery without angina pectoris: Secondary | ICD-10-CM | POA: Diagnosis not present

## 2022-08-28 DIAGNOSIS — H9190 Unspecified hearing loss, unspecified ear: Secondary | ICD-10-CM | POA: Diagnosis not present

## 2022-08-28 DIAGNOSIS — I4581 Long QT syndrome: Secondary | ICD-10-CM | POA: Diagnosis not present

## 2022-08-28 DIAGNOSIS — H04553 Acquired stenosis of bilateral nasolacrimal duct: Secondary | ICD-10-CM | POA: Diagnosis not present

## 2022-08-28 DIAGNOSIS — I495 Sick sinus syndrome: Secondary | ICD-10-CM | POA: Diagnosis not present

## 2022-08-28 DIAGNOSIS — M549 Dorsalgia, unspecified: Secondary | ICD-10-CM | POA: Diagnosis not present

## 2022-08-28 DIAGNOSIS — Z7984 Long term (current) use of oral hypoglycemic drugs: Secondary | ICD-10-CM | POA: Diagnosis not present

## 2022-08-28 DIAGNOSIS — I3139 Other pericardial effusion (noninflammatory): Secondary | ICD-10-CM | POA: Diagnosis not present

## 2022-08-28 DIAGNOSIS — Z955 Presence of coronary angioplasty implant and graft: Secondary | ICD-10-CM | POA: Diagnosis not present

## 2022-08-28 DIAGNOSIS — K5900 Constipation, unspecified: Secondary | ICD-10-CM | POA: Diagnosis not present

## 2022-08-28 DIAGNOSIS — Z7951 Long term (current) use of inhaled steroids: Secondary | ICD-10-CM | POA: Diagnosis not present

## 2022-08-28 DIAGNOSIS — G709 Myoneural disorder, unspecified: Secondary | ICD-10-CM | POA: Diagnosis not present

## 2022-08-29 DIAGNOSIS — R0602 Shortness of breath: Secondary | ICD-10-CM | POA: Diagnosis not present

## 2022-08-29 DIAGNOSIS — I2510 Atherosclerotic heart disease of native coronary artery without angina pectoris: Secondary | ICD-10-CM | POA: Diagnosis not present

## 2022-09-25 ENCOUNTER — Telehealth (INDEPENDENT_AMBULATORY_CARE_PROVIDER_SITE_OTHER): Payer: Self-pay | Admitting: *Deleted

## 2022-09-25 ENCOUNTER — Ambulatory Visit (INDEPENDENT_AMBULATORY_CARE_PROVIDER_SITE_OTHER): Payer: 59 | Admitting: Gastroenterology

## 2022-09-25 ENCOUNTER — Encounter (INDEPENDENT_AMBULATORY_CARE_PROVIDER_SITE_OTHER): Payer: Self-pay | Admitting: Gastroenterology

## 2022-09-25 VITALS — BP 162/91 | HR 71 | Temp 98.5°F | Ht 65.0 in | Wt 142.1 lb

## 2022-09-25 DIAGNOSIS — R1319 Other dysphagia: Secondary | ICD-10-CM | POA: Diagnosis not present

## 2022-09-25 DIAGNOSIS — D490 Neoplasm of unspecified behavior of digestive system: Secondary | ICD-10-CM | POA: Diagnosis not present

## 2022-09-25 DIAGNOSIS — K5900 Constipation, unspecified: Secondary | ICD-10-CM

## 2022-09-25 DIAGNOSIS — R1084 Generalized abdominal pain: Secondary | ICD-10-CM

## 2022-09-25 DIAGNOSIS — R110 Nausea: Secondary | ICD-10-CM

## 2022-09-25 DIAGNOSIS — K869 Disease of pancreas, unspecified: Secondary | ICD-10-CM | POA: Diagnosis not present

## 2022-09-25 MED ORDER — DICYCLOMINE HCL 10 MG PO CAPS: 10.0000 mg | ORAL_CAPSULE | Freq: Two times a day (BID) | ORAL | 3 refills | Status: DC | PRN

## 2022-09-25 NOTE — Telephone Encounter (Signed)
CT pancreas scheduled for 3/5, arrival 11:15am at [facility] radiology, liquids ONLY 4 hours prior to test  Called pt, LMOVM

## 2022-09-25 NOTE — Progress Notes (Addendum)
Referring Provider: No ref. provider found Primary Care Physician:  Patient, No Pcp Per Primary GI Physician: Jenetta Downer   Chief Complaint  Patient presents with   Abdominal Pain    Having pain in right side and into back. Has been going on for years. Some days worse.    HPI:   [name] is a 87 y.o. female with past medical history of COPD, coronary artery disease status post stent placement, [name] syndrome, hypertension, hyperlipidemia, mixed type IPMN, atrial fibrillation, anxiet   Patient presenting today for follow up   History:  Seen 12/03/2021 (Dr. Laural Golden). found to have a growing pancreatic cystic lesion with associated abdominal pain.  Due to these she was referred for endoscopic ultrasound.  Underwent endoscopic ultrasound with Dr. Rush Landmark on 02/24/2022 with fine described below.   A non-obstructing Schatzki ring was found at the gastroesophageal junction. Biopsies were taken with a cold forceps for disruption purposes. No other gross lesions were noted in the entire esophagus. After completion of the EUS, a guidewire was placed and the scope was withdrawn. Dilation was performed with a Savary dilator with mild resistance at 18 mm. The dilation site was examined following endoscope reinsertion and showed mild mucosal disruption/wrent just below the UES, mild improvement in luminal narrowing and no perforation. A 2 cm hiatal hernia was present. Multiple dispersed small erosions with no bleeding and no stigmata of recent bleeding were found in the entire examined stomach. Biopsies were taken with a cold forceps for histology and Helicobacter pylori testing.No other gross lesions were noted in the duodenal bulb, in the first portion of the duodenum and in the second portion of the duodenum.A fishmouth deformity was found at the major papilla.   Path A. STOMACH, BIOPSY:  Mild chronic gastritis with reactive epithelial changes and focal  intestinal metaplasia  Negative for H.  pylori, dysplasia and carcinoma  B. SCHATZKI'S RING, BIOPSY:  Reactive squamous mucosa  Chronic gastritis  Negative for intestinal metaplasia, dysplasia and carcinoma    EUS part: Anechoic lesions suggestive of three cysts were identified in the pancreatic head (1) and genu of the pancreas (2). The first cyst measured 19 mm by 10 mm in the HOP. The second cyst measured 7 mm by 6 mm in the NOP. The third cyst measured 9 mm by 7 mm in the NOP. There was no associated masses. Diagnostic needle aspiration for fluid was performed. Color Doppler imaging was utilized prior to needle puncture to confirm a lack of significant vascular structures within the needle path. One pass was made with the Expect 22 gauge needle using a transduodenal approach to the HOP cyst. A stylet was used. The amount of fluid collected was ~1 mL. The fluid was clear and white. Sample was sent for amylase concentration, cytology and CEAPancreatic parenchymal abnormalities were noted in the entire pancreas. These consisted of lobularity without honeycombing, cysts and hyperechoic strands. The pancreatic duct had a prominently branched endosonographic appearance and had a tortuous/ectatic appearance as well as some prominence in the pancreatic head (2.7 -> 4.0 mm), genu of the pancreas (3.0 mm), body of the pancreas (3.3 mm) and tail of the pancreas (2.4 mm).  There was no sign of significant endosonographic abnormality in the common bile duct (4.0 mm)and in the common hepatic duct (7.5 mm).Endosonographic imaging of the ampulla showed no intramural (subepithelial) lesion.No malignant-appearing lymph nodes were visualized in the celiac region (level 20), peripancreatic region and porta hepatis region. Endosonographic imaging in the visualized portion  of the liver showed no mass.   NOTE: could not get the Linear EUS to pass into the short position from D2 and thus could not evaluate the Uncinate Process of the Pancreas.   CEA 22 ,  amylase 21,144, cytology showed acellular specimen   case was discussed at multidisciplinary tumor board. considered that she was presenting changes concerning for a mixed branch duct/main duct IPMN.  It was considered that given her medical comorbidities she would not be the best surgical candidate but she will continue monitoring.  decided that she should have a repeat CT pancreas protocol in 6 months.  Depending on the findings on imaging, may need to discuss again the benefits versus risk of undergoing surgical resection.  Also, she was advised to obtain a baseline CA 19-9 which was not completed. patient underwent a CT of the abdomen with and without IV contrast on 05/01/2022 which showed decrease size of the cystic lesions of the pancreatic head and uncinate process measuring up to 15 mm.  She was advised to have a repeat CT pancreas protocol in 6 months.  -Last seen November 2023, at that time reported longstanding history of pain in her abdomen and nausea.  dicyclomine and Zofran have worked with the nausea and abdominal pain.  Her companion states that she has noticed her pain may have aggravated recently as she was having some more constipation than usual. She has a bowel movement every 1-2 days. taking Senna-docusate and occasionally Dulcolax.   Advised to start miralax, contiue senna, continue dicyclomine '10mg'$  BID, continue zofran PRN for nausea, repeat CT pancreatic protocol 10/2022  Present:  She reports continued abdominal pain and nausea. Feels that pain is similar to previous chronic abdominal pain though maybe a little worse. She notes pain from RUQ across her abdomen. She has nausea almost daily. Using zofran once daily but this does not seem to be working very well for her. She has occasional vomiting. She is not taking dicyclomine as she ran out and was not sure if she could get refills since Dr. Laural Golden is not here.   She is having a BM every day, she is taking 2 capfuls of miralax a  few times per week when needed. She is using the senna PRN as well, does not need it usually. Unsure of presence of blood in her stools as her vision is not good. She has darker stools but no melena.   Appetite is not good due to her nausea. She has a choking sensation at times when laying down. She tends to eat more soups and does protein shakes so that she does not get choked. Weight is stable. She has some heartburn at times but it is better than previously, she notes that if she is drinking the protein shakes she does not have much issues. Only certain foods cause her to have heartburn. She is taking protonix PRN.   She got a new pacemaker in December.She notes a rash on the front of her legs, noticed this when her jardiance was increased.    Last Colonoscopy:2016  Last Endoscopy: as above  Recommendations:    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[place])    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  Essential hypertension    GERD (gastroesophageal reflux disease)    H/O hiatal hernia    Headache(784.0)    HOH (hard of hearing)    Hyperlipidemia    Neuromuscular disorder (HCC)    Tremors   Pericardial effusion    Hemorrhagic    Presence of permanent cardiac pacemaker    Tachycardia-bradycardia syndrome [facility])    s/p Medtronic Adapta L dual chamber device  5/10    Past Surgical History:  Procedure Laterality Date   APPENDECTOMY     BIOPSY  02/24/2022   Procedure: BIOPSY;  Surgeon: Irving Copas., MD;  Location: [facility];  Service: Gastroenterology;;   CARDIAC CATHETERIZATION  2010   stents x2.   CATARACT EXTRACTION     CHOLECYSTECTOMY     COLONOSCOPY  2011   COLONOSCOPY N/A 10/19/2014   Procedure: COLONOSCOPY;  Surgeon: Rogene Houston, MD;  [facility];  Service: Endoscopy;  Laterality: N/A;  1030   CORONARY STENT INTERVENTION N/A 05/28/2021   Procedure: CORONARY STENT INTERVENTION;  Surgeon: Leonie Man, MD;  Location: [place] [facility];  Service: Cardiovascular;  Laterality: N/A;   ESOPHAGEAL DILATION N/A 05/02/2020   Procedure: ESOPHAGEAL DILATION;  Surgeon: Rogene Houston, MD;  [facility];  Service: Gastroenterology;  Laterality: N/A;   ESOPHAGOGASTRODUODENOSCOPY N/A 10/26/2014   Procedure: ESOPHAGOGASTRODUODENOSCOPY (EGD);  Surgeon: Rogene Houston, MD;  [facility];  Service: Endoscopy;  Laterality: N/A;  58 - Dr. has lunch and learn   ESOPHAGOGASTRODUODENOSCOPY N/A 02/24/2022   Procedure: ESOPHAGOGASTRODUODENOSCOPY (EGD);  Surgeon: Irving Copas., MD;  Location: [facility];  Service: Gastroenterology;  Laterality: N/A;   ESOPHAGOGASTRODUODENOSCOPY (EGD) WITH PROPOFOL N/A 05/02/2020   Procedure: ESOPHAGOGASTRODUODENOSCOPY (EGD) WITH PROPOFOL;  Surgeon: Rogene Houston, MD;  [facility];  Service: Gastroenterology;  Laterality: N/A;  250   Esophagogastroduodenoscopy with esophageal dilation  2004, 2006, 2007   EUS N/A 02/24/2022   Procedure: UPPER ENDOSCOPIC ULTRASOUND (EUS) LINEAR;  Surgeon: Irving Copas., MD;  [facility];  Service: Gastroenterology;  Laterality: N/A;   FINE NEEDLE ASPIRATION  02/24/2022   Procedure: FINE NEEDLE ASPIRATION;  Surgeon: Rush Landmark Telford Nab., MD;  Location: [facility];  Service: Gastroenterology;;  red path sent   GIVENS CAPSULE STUDY N/A 10/31/2014   Procedure: GIVENS CAPSULE STUDY;  Surgeon: Rogene Houston, MD;  [facility];  Service: Endoscopy;  Laterality: N/A;  730 -- pacemaker--needs monitoring--outpatient bed   INSERT / REPLACE / REMOVE PACEMAKER  2010   INTRAVASCULAR ULTRASOUND/IVUS N/A 05/28/2021   Procedure: Intravascular Ultrasound/IVUS;  Surgeon: Leonie Man, MD;  Location: [place] [facility];  Service: Cardiovascular;  Laterality: N/A;   LEFT HEART CATH AND CORONARY ANGIOGRAPHY N/A 03/21/2021   Procedure: LEFT HEART CATH AND CORONARY ANGIOGRAPHY;  Surgeon: Belva Crome, MD;  Location: [place] [facility];  Service: Cardiovascular;  Laterality: N/A;   LEFT HEART CATHETERIZATION WITH CORONARY ANGIOGRAM N/A 11/17/2011   Procedure: LEFT HEART CATHETERIZATION WITH CORONARY ANGIOGRAM;  Surgeon: Sherren Mocha, MD;  Location: [facility] C[facility] LAB;  Service: Cardiovascular;  Laterality: N/A;   LEFT HEART CATHETERIZATION WITH CORONARY ANGIOGRAM N/A 09/26/2014   Procedure: LEFT HEART CATHETERIZATION WITH CORONARY ANGIOGRAM;  Surgeon: Leonie Man, MD;  Location: [facility] CATH LAB;  Service: Cardiovascular;  Laterality: N/A;   PPM GENERATOR CHANGEOUT N/A 07/16/2022   Procedure: PPM GENERATOR CHANGEOUT;  Surgeon: Evans Lance, MD;  Location: [place] [facility];  Service:  Cardiovascular;  Laterality: N/A;   Right rotator cuff repair     SAVORY DILATION N/A 02/24/2022   Procedure: SAVORY DILATION;  Surgeon: Rush Landmark Telford Nab., MD;  [facility];  Service: Gastroenterology;  Laterality: N/A;   SHOULDER ACROMIOPLASTY Right 05/30/2015   Procedure: RIGHT SHOULDER ACROMIOPLASTY;  Surgeon: Carole Civil, MD;  [facility];  Service: Orthopedics;  Laterality: Right;   SHOULDER OPEN ROTATOR CUFF REPAIR Right 05/30/2015   Procedure: OPEN ROTATOR CUFF REPAIR RIGHT SHOULDER;  Surgeon: Carole Civil, MD;  [facility];  Service: Orthopedics;  Laterality: Right;   SHOULDER OPEN ROTATOR CUFF REPAIR Left 10/22/2016   Procedure: ROTATOR CUFF REPAIR SHOULDER OPEN;  Surgeon: Carole Civil, MD;  [facility]: [facility];  Service: Orthopedics;  Laterality: Left;   Subxiphoid pericardial window  11/10   VAGINAL HYSTERECTOMY      Current Outpatient Medications  Medication Sig Dispense Refill   albuterol (PROVENTIL) (2.5 MG/3ML) 0.083% nebulizer solution Take 3 mLs (2.5 mg total) by  nebulization every 4 (four) hours as needed for wheezing or shortness of breath. 75 mL 2   ALPRAZolam (XANAX) 1 MG tablet Take 1 mg by mouth 3 (three) times daily as needed for anxiety.     amiodarone (PACERONE) 200 MG tablet TAKE 1 TABLET ONCE DAILY ON MONDAY THROUGH SATURDAY, NONE ON SUNDAY. 72 tablet 0   amLODipine (NORVASC) 5 MG tablet Take 1 tablet (5 mg total) by mouth daily. 30 tablet 4   aspirin EC 81 MG tablet Take 1 tablet (81 mg total) by mouth daily with breakfast. Swallow whole. 30 tablet 12   Cyanocobalamin (VITAMIN B-12) 5000 MCG SUBL Take 5,000 mcg by mouth daily.     furosemide (LASIX) 40 MG tablet Take 1 tablet (40 mg total) by mouth daily as needed for fluid or edema. 30 tablet 0   isosorbide mononitrate (IMDUR) 30 MG 24 hr tablet Take 1 tablet (30 mg total) by mouth daily. 90 tablet 5   JARDIANCE 25 MG TABS tablet Take 25 mg by mouth daily.     metoprolol succinate (TOPROL-XL) 50 MG 24 hr tablet Take 1 tablet (50 mg total) by mouth 2 (two) times daily. Take with or immediately following a meal. 60 tablet 4   Multiple Vitamin (MULTIVITAMIN WITH MINERALS) TABS tablet Take 1 tablet by mouth daily.      nitroGLYCERIN (NITROSTAT) 0.4 MG SL tablet Place 1 tablet (0.4 mg total) under the tongue every 5 (five) minutes x 3 doses as needed for chest pain. 30 tablet 12   OXYGEN Inhale 2 L into the lungs as needed (PRN as needed at night).     pantoprazole (PROTONIX) 40 MG tablet TAKE (1) TABLET BY MOUTH ONCE DAILY. 90 tablet 0   potassium chloride (KLOR-CON) 10 MEQ tablet Take 1 tablet (10 mEq total) by mouth daily. Take While taking Lasix/furosemide 30 tablet 2   primidone (MYSOLINE) 50 MG tablet Take 50 mg by mouth at bedtime.     PROAIR HFA 108 (90 Base) MCG/ACT inhaler Inhale 1 puff into the lungs every 4 (four) hours as needed for wheezing or shortness of breath.      ranolazine (RANEXA) 500 MG 12 hr tablet Take 1 tablet (500 mg total) by mouth 2 (two) times daily. 60 tablet 8    senna-docusate (SENOKOT-S) 8.6-50 MG tablet Take 1 tablet by mouth at bedtime. (Patient taking differently: Take 1-2 tablets by mouth daily as needed (constipation.).) 30 tablet 0   ticagrelor (BRILINTA)  60 MG TABS tablet Take 1 tablet (60 mg total) by mouth 2 (two) times daily. 60 tablet 6   vitamin E 180 MG (400 UNITS) capsule Take 400 Units by mouth daily.     dicyclomine (BENTYL) 10 MG capsule Take 1 capsule (10 mg total) by mouth 2 (two) times daily as needed for spasms. 60 capsule 3   rosuvastatin (CRESTOR) 40 MG tablet Take 1 tablet (40 mg total) by mouth daily. (Patient not taking: Reported on 09/25/2022) 90 tablet 3   No current facility-administered medications for this visit.    Allergies as of 09/25/2022 - Review Complete 09/25/2022  Allergen Reaction Noted   Amitriptyline hcl Other (See Comments) 06/05/2009   Plavix [clopidogrel bisulfate] Hives 08/30/2021   Sulfonamide derivatives Other (See Comments)     Family History  Problem Relation Age of Onset   Cancer Mother        Colon    Coronary artery disease Sister    Coronary artery disease Brother    Arthritis Other    Lung disease Other    Asthma Other     Social History   Socioeconomic History   Marital status: Widowed    Spouse name: Not on file   Number of children: 1   Years of education: Not on file   Highest education level: 8th grade  Occupational History   Occupation: Retired    Fish farm manager: RETIRED  Tobacco Use   Smoking status: Former    Packs/day: 2.00    Years: 10.00    Total pack years: 20.00    Types: Cigarettes    Quit date: 05/23/1989    Years since quitting: 33.3    Passive exposure: Past   Smokeless tobacco: Never  Vaping Use   Vaping Use: Never used  Substance and Sexual Activity   Alcohol use: No    Alcohol/week: 0.0 standard drinks of alcohol   Drug use: No   Sexual activity: Never  Other Topics Concern   Not on file  Social History Narrative   She lives alone, has adopted  daughter.   Social Determinants of Health   Financial Resource Strain: Not on file  Food Insecurity: No Food Insecurity (06/30/2022)   Hunger Vital Sign    Worried About Running Out of Food in the Last Year: Never true    Ran Out of Food in the Last Year: Never true  Transportation Needs: No Transportation Needs (06/30/2022)   PRAPARE - Hydrologist (Medical): No    Lack of Transportation (Non-Medical): No  Physical Activity: Not on file  Stress: Not on file  Social Connections: Not on file   Review of systems General: negative for malaise, night sweats, fever, chills, weight loss Neck: Negative for lumps, goiter, pain and significant neck swelling Resp: Negative for cough, wheezing, dyspnea at rest CV: Negative for chest pain, leg swelling, palpitations, orthopnea GI: denies melena, hematochezia, vomiting, diarrhea, constipation,odyonophagia, early satiety or unintentional weight loss. +nausea +abdominal pain  +dysphagia MSK: Negative for joint pain or swelling, back pain, and muscle pain. Derm: Negative for itching or rash Psych: Denies depression, anxiety, memory loss, confusion. No homicidal or suicidal ideation.  Heme: Negative for prolonged bleeding, bruising easily, and swollen nodes. Endocrine: Negative for cold or heat intolerance, polyuria, polydipsia and goiter. Neuro: negative for tremor, gait imbalance, syncope and seizures. The remainder of the review of systems is noncontributory.  Physical Exam: BP (!) 162/91 (BP Location: Right Arm, Patient Position: Sitting, Cuff  Size: Normal)   Pulse 71   Temp 98.5 F (36.9 C) (Oral)   Ht '5\' 5"'$  (1.651 m)   Wt 142 lb 1.6 oz (64.5 kg)   BMI 23.65 kg/m  General:   Alert and oriented. No distress noted. Pleasant and cooperative.  Head:  Normocephalic and atraumatic. Eyes:  Conjuctiva clear without scleral icterus. Mouth:  Oral mucosa pink and moist. Good dentition. No lesions. Heart: Normal rate  and rhythm, s1 and s2 heart sounds present.  Lungs: Clear lung sounds in all lobes. Respirations equal and unlabored. Abdomen:  +BS, soft, non-tender and non-distended. No rebound or guarding. No HSM or masses noted. Derm: No palmar erythema or jaundice Msk:  Symmetrical without gross deformities. Normal posture. Extremities:  Without edema. Neurologic:  Alert and  oriented x4 Psych:  Alert and cooperative. Normal mood and affect.  Invalid input(s): "6 MONTHS"   ASSESSMENT: [name] is a 87 y.o. female presenting today for follow up  Chronic abdominal pain and Nausea: extensive workup in the past. Managed with zofran and dicyclomine, she continues to use zofran though is unsure if it is working as well as previously. She ran out of dicyclomine.  Recommend she restart dicyclomine 10 mg twice daily as needed for her abdominal pain and should continue Zofran 4 mg up to twice a day as needed.  Unfortunately given her age I do not feel comfortable prescribing Compazine or promethazine for her nausea due to concern for adverse effects.  Pancreatic lesions/suspect IPMNs: she is due to CT pancreatic protocol, will get this scheduled for 6 month monitoring of pancreatic lesions. As CA 19-9 has not been completed thus far, would recommend checking this as well.   Dysphagia:history of non obstructed Schatzki's ring which was last dilated in July 2023.  She does well with softer more liquid foods, she is also doing some protein shakes and her weight is stable.  Will continue to monitor for worsening dysphagia.  Constipation: well controlled with senna and miralax PRN. Having 2 BMs per day.  The patient was found to have elevated blood pressure when vital signs were checked in the office. The blood pressure was rechecked by the nursing staff and it was found be persistently elevated >140/90 mmHg. I personally advised to the patient to follow up closely with PCP for hypertension control.   PLAN:  CT  pancreatic protocol March 2024 2. CA 19-9  3. Refill dicyclomine '10mg'$  BID PRN  4. Follow up with PCP on HTN 5.Continue zofran '4mg'$  BID PRN  6. Continue with softer foods/liquids  All questions were answered, patient verbalized understanding and is in agreement with plan as outlined above.    Follow Up: 6 months   Carey Lafon L. Alver Sorrow, MSN, APRN, AGNP-C Adult-Gerontology Nurse Practitioner [facility]  I have reviewed the note and agree with the APP's assessment as described in this progress note  Case discussed in the past at [facility] by dr. Rush Landmark. He had evidence of changes suggestive of mixed IPMN, CEA level in fluid was <192, no abnormal cellularity or nodules on EUS. Will repeat imaging this year - may discuss with them in next appointment benefits of continuing surveillance and treatment options given her frailty and comorbidities.  Maylon Peppers, MD Gastroenterology and Hepatology [facility] Gastroenterology

## 2022-09-25 NOTE — Patient Instructions (Addendum)
We will get some special imaging of your pancreas I have sent dicyclomine to use for your abdominal pain, you can take this up to twice per day Please continue to use zofran for nausea, you can take this up to twice a day for your nausea We will check the lab that was ordered previously regarding your pancreas as well (CA 19 9)  Follow up 3 months   It was a pleasure to see you today. I want to create trusting relationships with patients and provide genuine, compassionate, and quality care. I truly value your feedback! please be on the lookout for a survey regarding your visit with me today. I appreciate your input about our visit and your time in completing this!    Dyonna Jaspers L. Alver Sorrow, MSN, APRN, AGNP-C Adult-Gerontology Nurse Practitioner [facility] Gastroenterology at [facility]

## 2022-09-26 LAB — CANCER ANTIGEN 19-9: CA 19-9: 14 U/mL (ref <34)

## 2022-09-29 DIAGNOSIS — I2510 Atherosclerotic heart disease of native coronary artery without angina pectoris: Secondary | ICD-10-CM | POA: Diagnosis not present

## 2022-09-29 DIAGNOSIS — R110 Nausea: Secondary | ICD-10-CM | POA: Insufficient documentation

## 2022-09-29 DIAGNOSIS — R1084 Generalized abdominal pain: Secondary | ICD-10-CM | POA: Insufficient documentation

## 2022-09-29 DIAGNOSIS — R0602 Shortness of breath: Secondary | ICD-10-CM | POA: Diagnosis not present

## 2022-09-29 NOTE — Addendum Note (Signed)
Addended by: Harvel Quale on: 09/29/2022 09:35 PM   Modules accepted: Level of Service

## 2022-10-01 NOTE — Telephone Encounter (Signed)
Spoke with pt and she is aware of appt details.

## 2022-10-02 ENCOUNTER — Encounter: Payer: Self-pay | Admitting: Radiology

## 2022-10-06 ENCOUNTER — Telehealth (INDEPENDENT_AMBULATORY_CARE_PROVIDER_SITE_OTHER): Payer: Self-pay | Admitting: Gastroenterology

## 2022-10-06 NOTE — Telephone Encounter (Signed)
Pt left voicemail in regards to CT scan appt time for tomorrow. Contacted pt and informed her to be there at 11:15 AM to register for 11:30 AM appt. Pt verbalized understanding.

## 2022-10-07 ENCOUNTER — Ambulatory Visit (HOSPITAL_COMMUNITY)
Admission: RE | Admit: 2022-10-07 | Discharge: 2022-10-07 | Disposition: A | Discharge status: Home / Self Care | Payer: 59 | Source: Ambulatory Visit | Attending: Gastroenterology | Admitting: Gastroenterology

## 2022-10-07 ENCOUNTER — Encounter (HOSPITAL_COMMUNITY): Payer: Self-pay | Admitting: Radiology

## 2022-10-07 DIAGNOSIS — K862 Cyst of pancreas: Secondary | ICD-10-CM | POA: Diagnosis not present

## 2022-10-07 DIAGNOSIS — D490 Neoplasm of unspecified behavior of digestive system: Secondary | ICD-10-CM | POA: Diagnosis not present

## 2022-10-07 DIAGNOSIS — K869 Disease of pancreas, unspecified: Secondary | ICD-10-CM | POA: Diagnosis not present

## 2022-10-07 LAB — POCT I-STAT CREATININE: Creatinine, Ser: 0.7 mg/dL (ref 0.44–1.00)

## 2022-10-07 MED ORDER — IOHEXOL 300 MG/ML  SOLN
100.0000 mL | Freq: Once | INTRAMUSCULAR | Status: AC | PRN
  Administered 2022-10-07: 100 mL via INTRAVENOUS

## 2022-10-22 ENCOUNTER — Ambulatory Visit (INDEPENDENT_AMBULATORY_CARE_PROVIDER_SITE_OTHER): Payer: 59

## 2022-10-22 DIAGNOSIS — I495 Sick sinus syndrome: Secondary | ICD-10-CM | POA: Diagnosis not present

## 2022-10-22 DIAGNOSIS — R7301 Impaired fasting glucose: Secondary | ICD-10-CM | POA: Diagnosis not present

## 2022-10-22 DIAGNOSIS — I10 Essential (primary) hypertension: Secondary | ICD-10-CM | POA: Diagnosis not present

## 2022-10-23 LAB — CUP PACEART REMOTE DEVICE CHECK
Battery Remaining Longevity: 143 mo
Battery Voltage: 3.19 V
Brady Statistic AP VP Percent: 53.32 %
Brady Statistic AP VS Percent: 39.72 %
Brady Statistic AS VP Percent: 0 %
Brady Statistic AS VS Percent: 6.96 %
Brady Statistic RA Percent Paced: 92.19 %
Brady Statistic RV Percent Paced: 53.32 %
Date Time Interrogation Session: 20240320125921
Implantable Lead Connection Status: 753985
Implantable Lead Connection Status: 753985
Implantable Lead Implant Date: 20100526
Implantable Lead Implant Date: 20100526
Implantable Lead Location: 753859
Implantable Lead Location: 753860
Implantable Lead Model: 5076
Implantable Lead Model: 5076
Implantable Pulse Generator Implant Date: 20231213
Lead Channel Impedance Value: 342 Ohm
Lead Channel Impedance Value: 342 Ohm
Lead Channel Impedance Value: 380 Ohm
Lead Channel Impedance Value: 399 Ohm
Lead Channel Pacing Threshold Amplitude: 0.75 V
Lead Channel Pacing Threshold Amplitude: 1.125 V
Lead Channel Pacing Threshold Pulse Width: 0.4 ms
Lead Channel Pacing Threshold Pulse Width: 0.4 ms
Lead Channel Sensing Intrinsic Amplitude: 1 mV
Lead Channel Sensing Intrinsic Amplitude: 1 mV
Lead Channel Sensing Intrinsic Amplitude: 6.25 mV
Lead Channel Sensing Intrinsic Amplitude: 6.25 mV
Lead Channel Setting Pacing Amplitude: 1.5 V
Lead Channel Setting Pacing Amplitude: 2 V
Lead Channel Setting Pacing Pulse Width: 0.4 ms
Lead Channel Setting Sensing Sensitivity: 1.2 mV
Zone Setting Status: 755011

## 2022-10-27 ENCOUNTER — Other Ambulatory Visit: Payer: Self-pay

## 2022-10-27 ENCOUNTER — Emergency Department (HOSPITAL_COMMUNITY): Payer: 59

## 2022-10-27 ENCOUNTER — Encounter (HOSPITAL_COMMUNITY): Payer: Self-pay | Admitting: *Deleted

## 2022-10-27 ENCOUNTER — Inpatient Hospital Stay (HOSPITAL_COMMUNITY)
Admission: EM | Admit: 2022-10-27 | Discharge: 2022-10-30 | DRG: 194 | Disposition: A | Discharge status: Skilled Nursing Facility | Payer: 59 | Attending: Internal Medicine | Admitting: Internal Medicine

## 2022-10-27 DIAGNOSIS — Z9861 Coronary angioplasty status: Secondary | ICD-10-CM | POA: Diagnosis not present

## 2022-10-27 DIAGNOSIS — R251 Tremor, unspecified: Secondary | ICD-10-CM | POA: Diagnosis not present

## 2022-10-27 DIAGNOSIS — I480 Paroxysmal atrial fibrillation: Secondary | ICD-10-CM | POA: Diagnosis not present

## 2022-10-27 DIAGNOSIS — Z87891 Personal history of nicotine dependence: Secondary | ICD-10-CM

## 2022-10-27 DIAGNOSIS — L282 Other prurigo: Secondary | ICD-10-CM | POA: Insufficient documentation

## 2022-10-27 DIAGNOSIS — Z7902 Long term (current) use of antithrombotics/antiplatelets: Secondary | ICD-10-CM

## 2022-10-27 DIAGNOSIS — R000 Tachycardia, unspecified: Secondary | ICD-10-CM | POA: Diagnosis not present

## 2022-10-27 DIAGNOSIS — J9611 Chronic respiratory failure with hypoxia: Secondary | ICD-10-CM | POA: Diagnosis not present

## 2022-10-27 DIAGNOSIS — I2510 Atherosclerotic heart disease of native coronary artery without angina pectoris: Secondary | ICD-10-CM | POA: Diagnosis present

## 2022-10-27 DIAGNOSIS — Z515 Encounter for palliative care: Secondary | ICD-10-CM | POA: Diagnosis not present

## 2022-10-27 DIAGNOSIS — Z955 Presence of coronary angioplasty implant and graft: Secondary | ICD-10-CM

## 2022-10-27 DIAGNOSIS — C259 Malignant neoplasm of pancreas, unspecified: Secondary | ICD-10-CM | POA: Diagnosis present

## 2022-10-27 DIAGNOSIS — F600 Paranoid personality disorder: Secondary | ICD-10-CM | POA: Diagnosis present

## 2022-10-27 DIAGNOSIS — R9431 Abnormal electrocardiogram [ECG] [EKG]: Secondary | ICD-10-CM | POA: Diagnosis not present

## 2022-10-27 DIAGNOSIS — Z950 Presence of cardiac pacemaker: Secondary | ICD-10-CM

## 2022-10-27 DIAGNOSIS — Z825 Family history of asthma and other chronic lower respiratory diseases: Secondary | ICD-10-CM

## 2022-10-27 DIAGNOSIS — I159 Secondary hypertension, unspecified: Principal | ICD-10-CM

## 2022-10-27 DIAGNOSIS — I4891 Unspecified atrial fibrillation: Secondary | ICD-10-CM

## 2022-10-27 DIAGNOSIS — Z7982 Long term (current) use of aspirin: Secondary | ICD-10-CM

## 2022-10-27 DIAGNOSIS — I700 Atherosclerosis of aorta: Secondary | ICD-10-CM | POA: Insufficient documentation

## 2022-10-27 DIAGNOSIS — J440 Chronic obstructive pulmonary disease with acute lower respiratory infection: Secondary | ICD-10-CM | POA: Diagnosis not present

## 2022-10-27 DIAGNOSIS — Z888 Allergy status to other drugs, medicaments and biological substances status: Secondary | ICD-10-CM

## 2022-10-27 DIAGNOSIS — K219 Gastro-esophageal reflux disease without esophagitis: Secondary | ICD-10-CM | POA: Diagnosis not present

## 2022-10-27 DIAGNOSIS — Z9841 Cataract extraction status, right eye: Secondary | ICD-10-CM

## 2022-10-27 DIAGNOSIS — I10 Essential (primary) hypertension: Secondary | ICD-10-CM | POA: Diagnosis present

## 2022-10-27 DIAGNOSIS — E876 Hypokalemia: Secondary | ICD-10-CM | POA: Diagnosis present

## 2022-10-27 DIAGNOSIS — Z7984 Long term (current) use of oral hypoglycemic drugs: Secondary | ICD-10-CM | POA: Diagnosis not present

## 2022-10-27 DIAGNOSIS — J449 Chronic obstructive pulmonary disease, unspecified: Secondary | ICD-10-CM | POA: Diagnosis present

## 2022-10-27 DIAGNOSIS — I5032 Chronic diastolic (congestive) heart failure: Secondary | ICD-10-CM | POA: Diagnosis not present

## 2022-10-27 DIAGNOSIS — Z90710 Acquired absence of both cervix and uterus: Secondary | ICD-10-CM

## 2022-10-27 DIAGNOSIS — I160 Hypertensive urgency: Secondary | ICD-10-CM | POA: Diagnosis not present

## 2022-10-27 DIAGNOSIS — F22 Delusional disorders: Secondary | ICD-10-CM | POA: Diagnosis not present

## 2022-10-27 DIAGNOSIS — E782 Mixed hyperlipidemia: Secondary | ICD-10-CM | POA: Diagnosis not present

## 2022-10-27 DIAGNOSIS — Z9049 Acquired absence of other specified parts of digestive tract: Secondary | ICD-10-CM

## 2022-10-27 DIAGNOSIS — R0602 Shortness of breath: Secondary | ICD-10-CM | POA: Diagnosis not present

## 2022-10-27 DIAGNOSIS — I4819 Other persistent atrial fibrillation: Secondary | ICD-10-CM | POA: Diagnosis present

## 2022-10-27 DIAGNOSIS — Z79899 Other long term (current) drug therapy: Secondary | ICD-10-CM

## 2022-10-27 DIAGNOSIS — F419 Anxiety disorder, unspecified: Secondary | ICD-10-CM | POA: Diagnosis present

## 2022-10-27 DIAGNOSIS — I495 Sick sinus syndrome: Secondary | ICD-10-CM | POA: Diagnosis present

## 2022-10-27 DIAGNOSIS — J189 Pneumonia, unspecified organism: Secondary | ICD-10-CM | POA: Diagnosis present

## 2022-10-27 DIAGNOSIS — E785 Hyperlipidemia, unspecified: Secondary | ICD-10-CM | POA: Diagnosis present

## 2022-10-27 DIAGNOSIS — Z66 Do not resuscitate: Secondary | ICD-10-CM | POA: Diagnosis present

## 2022-10-27 DIAGNOSIS — Z8249 Family history of ischemic heart disease and other diseases of the circulatory system: Secondary | ICD-10-CM | POA: Diagnosis not present

## 2022-10-27 DIAGNOSIS — I5022 Chronic systolic (congestive) heart failure: Secondary | ICD-10-CM | POA: Diagnosis not present

## 2022-10-27 DIAGNOSIS — F321 Major depressive disorder, single episode, moderate: Secondary | ICD-10-CM

## 2022-10-27 DIAGNOSIS — I482 Chronic atrial fibrillation, unspecified: Secondary | ICD-10-CM | POA: Diagnosis not present

## 2022-10-27 DIAGNOSIS — R5381 Other malaise: Secondary | ICD-10-CM | POA: Diagnosis not present

## 2022-10-27 DIAGNOSIS — K8689 Other specified diseases of pancreas: Secondary | ICD-10-CM | POA: Diagnosis not present

## 2022-10-27 DIAGNOSIS — G250 Essential tremor: Secondary | ICD-10-CM | POA: Diagnosis not present

## 2022-10-27 DIAGNOSIS — Z91128 Patient's intentional underdosing of medication regimen for other reason: Secondary | ICD-10-CM

## 2022-10-27 DIAGNOSIS — I119 Hypertensive heart disease without heart failure: Secondary | ICD-10-CM | POA: Diagnosis not present

## 2022-10-27 DIAGNOSIS — Z882 Allergy status to sulfonamides status: Secondary | ICD-10-CM

## 2022-10-27 DIAGNOSIS — Z9842 Cataract extraction status, left eye: Secondary | ICD-10-CM

## 2022-10-27 DIAGNOSIS — I252 Old myocardial infarction: Secondary | ICD-10-CM | POA: Diagnosis not present

## 2022-10-27 DIAGNOSIS — H9190 Unspecified hearing loss, unspecified ear: Secondary | ICD-10-CM | POA: Diagnosis present

## 2022-10-27 DIAGNOSIS — Z0001 Encounter for general adult medical examination with abnormal findings: Secondary | ICD-10-CM | POA: Diagnosis not present

## 2022-10-27 DIAGNOSIS — R410 Disorientation, unspecified: Secondary | ICD-10-CM | POA: Diagnosis not present

## 2022-10-27 DIAGNOSIS — Z7189 Other specified counseling: Secondary | ICD-10-CM | POA: Diagnosis not present

## 2022-10-27 LAB — MAGNESIUM: Magnesium: 2.2 mg/dL (ref 1.7–2.4)

## 2022-10-27 LAB — URINALYSIS, ROUTINE W REFLEX MICROSCOPIC
Bacteria, UA: NONE SEEN
Bilirubin Urine: NEGATIVE
Glucose, UA: NEGATIVE mg/dL
Hgb urine dipstick: NEGATIVE
Ketones, ur: NEGATIVE mg/dL
Nitrite: NEGATIVE
Protein, ur: 100 mg/dL — AB
Specific Gravity, Urine: 1.006 (ref 1.005–1.030)
pH: 7 (ref 5.0–8.0)

## 2022-10-27 LAB — CBC
HCT: 43.5 % (ref 36.0–46.0)
Hemoglobin: 14.3 g/dL (ref 12.0–15.0)
MCH: 29.9 pg (ref 26.0–34.0)
MCHC: 32.9 g/dL (ref 30.0–36.0)
MCV: 91 fL (ref 80.0–100.0)
Platelets: 204 10*3/uL (ref 150–400)
RBC: 4.78 MIL/uL (ref 3.87–5.11)
RDW: 14.6 % (ref 11.5–15.5)
WBC: 11.8 10*3/uL — ABNORMAL HIGH (ref 4.0–10.5)
nRBC: 0 % (ref 0.0–0.2)

## 2022-10-27 LAB — COMPREHENSIVE METABOLIC PANEL
ALT: 22 U/L (ref 0–44)
AST: 35 U/L (ref 15–41)
Albumin: 4.2 g/dL (ref 3.5–5.0)
Alkaline Phosphatase: 97 U/L (ref 38–126)
Anion gap: 13 (ref 5–15)
BUN: 10 mg/dL (ref 8–23)
CO2: 22 mmol/L (ref 22–32)
Calcium: 9.8 mg/dL (ref 8.9–10.3)
Chloride: 104 mmol/L (ref 98–111)
Creatinine, Ser: 0.68 mg/dL (ref 0.44–1.00)
GFR, Estimated: >60 mL/min (ref >60)
Glucose, Bld: 133 mg/dL — ABNORMAL HIGH (ref 70–99)
Potassium: 3.4 mmol/L — ABNORMAL LOW (ref 3.5–5.1)
Sodium: 139 mmol/L (ref 135–145)
Total Bilirubin: 1.1 mg/dL (ref 0.3–1.2)
Total Protein: 8 g/dL (ref 6.5–8.1)

## 2022-10-27 LAB — RAPID URINE DRUG SCREEN, HOSP PERFORMED
Amphetamines: NOT DETECTED
Barbiturates: NOT DETECTED
Benzodiazepines: POSITIVE — AB
Cocaine: NOT DETECTED
Opiates: NOT DETECTED
Tetrahydrocannabinol: NOT DETECTED

## 2022-10-27 LAB — ETHANOL: Alcohol, Ethyl (B): <10 mg/dL (ref <10)

## 2022-10-27 LAB — LIPASE, BLOOD: Lipase: 33 U/L (ref 11–51)

## 2022-10-27 MED ORDER — NICARDIPINE HCL IN NACL 20-0.86 MG/200ML-% IV SOLN: 3.0000 mg/h | INTRAVENOUS | Status: DC

## 2022-10-27 MED ORDER — SODIUM CHLORIDE 0.9 % IV SOLN
100.0000 mg | Freq: Two times a day (BID) | INTRAVENOUS | Status: DC
  Administered 2022-10-28: 100 mg via INTRAVENOUS
  Filled 2022-10-27 (×5): qty 100

## 2022-10-27 MED ORDER — DIPHENHYDRAMINE HCL 50 MG/ML IJ SOLN
12.5000 mg | Freq: Once | INTRAMUSCULAR | Status: AC
  Administered 2022-10-27: 12.5 mg via INTRAVENOUS

## 2022-10-27 MED ORDER — METOPROLOL TARTRATE 25 MG PO TABS: 25.0000 mg | ORAL_TABLET | ORAL | Status: DC

## 2022-10-27 MED ORDER — METOPROLOL TARTRATE 5 MG/5ML IV SOLN
5.0000 mg | INTRAVENOUS | Status: AC
  Administered 2022-10-27: 5 mg via INTRAVENOUS
  Filled 2022-10-27: qty 5

## 2022-10-27 MED ORDER — LORAZEPAM 2 MG/ML IJ SOLN
1.0000 mg | Freq: Once | INTRAMUSCULAR | Status: AC
  Administered 2022-10-27: 1 mg via INTRAVENOUS

## 2022-10-27 MED ORDER — HALOPERIDOL LACTATE 5 MG/ML IJ SOLN: 2.5000 mg | INTRAMUSCULAR | Status: DC

## 2022-10-27 MED ORDER — DIPHENHYDRAMINE HCL 50 MG/ML IJ SOLN
12.5000 mg | Freq: Once | INTRAMUSCULAR | Status: DC
  Filled 2022-10-27: qty 1

## 2022-10-27 MED ORDER — SODIUM CHLORIDE 0.9 % IV SOLN
2.0000 g | INTRAVENOUS | Status: AC
  Administered 2022-10-28: 2 g via INTRAVENOUS
  Filled 2022-10-27: qty 20

## 2022-10-27 MED ORDER — IRBESARTAN 150 MG PO TABS: 150.0000 mg | ORAL_TABLET | ORAL | Status: DC

## 2022-10-27 MED ORDER — IRBESARTAN 150 MG PO TABS
150.0000 mg | ORAL_TABLET | ORAL | Status: DC
  Filled 2022-10-27: qty 1

## 2022-10-27 MED ORDER — LORAZEPAM 2 MG/ML IJ SOLN
1.0000 mg | Freq: Once | INTRAMUSCULAR | Status: DC
  Filled 2022-10-27: qty 1

## 2022-10-27 NOTE — H&P (Incomplete)
History and Physical    Patient: [name] [id number] DOB: July 06, 1936 DOA: 10/27/2022 DOS: the patient was seen and examined on 10/28/2022 PCP: Celene Squibb, MD  Patient coming from: Home  Chief Complaint:  Chief Complaint  Patient presents with   Hypertension   HPI: [name] is a 87 y.o. female with medical history significant of chronic respiratory failure on supplemental oxygen at 2 LPM at night, COPD, atrial fibrillation, CHF, suspected pancreatic cancer, tachycardia-bradycardia syndrome s/p pacemaker, CAD who presents to the emergency department.  Patient was unable to provide a history due to altered mental status, she was suspicious of staff wanting to kill her.  History was obtained from ED physician and ED medical record.  Per report, family noted that patient's BP was elevated, apparently she stopped taking her medications because she states that she saw her usual symptoms last night and told to stop taking all her meds and she told EMS team that she was ready to die.  Patient states that she lost her baby years ago on this date and was upset.  ED physician states that if called patient's granddaughter who told him that patient called and said that she wanted to come to die in her house. Patient lives alone and a neighbor checks on her daily.  Granddaughter states that patient was very religious and thinks that she has been depressed due to cancer, heart problems, nausea and vomiting.  There was no reported history of dementia.  ED Course: In the emergency department, temperature was 97.5 F, respiratory rate 23/min, pulse 122 bpm, blood pressure 202/117, O2 sat was 100% on room air.  Workup in the ED showed normal CBC except for WBC of 11.8, normal BMP except for potassium 3.4 and blood glucose 133 lipase 33, magnesium 2.2, alcohol level less than 10, rapid urine drug screen was positive for benzodiazepine (received Ativan in the ED).  Urinalysis was unimpressive for UTI.  Urine  culture pending. Chest x-ray showed atherogenesis groundglass opacities in the right upper lobe and left mid to lower lung, suspect for multifocal pneumonia.  Cardiomegaly CT head without contrast showed no CT evidence for acute intracranial abnormality.  Atrophy and chronic small vessel ischemic changes of the white matter.  She was treated with IV Lopressor 5 mg x 1, Ativan 1 mg, Benadryl 12.5 mg x 1.  IV ceftriaxone and doxycycline due to presumed CAP.  Hospitalist was asked to admit patient for further evaluation and management.  Review of Systems: Review of systems as noted in the HPI. All other systems reviewed and are negative.   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    Essential hypertension    GERD (gastroesophageal reflux disease)    H/O hiatal hernia    Headache(784.0)    HOH (hard of hearing)    Hyperlipidemia    Neuromuscular disorder (HCC)    Tremors   Pericardial effusion    Hemorrhagic    Presence of permanent cardiac pacemaker    Tachycardia-bradycardia syndrome (HCC)    s/p Medtronic Adapta L dual chamber device  5/10   Past Surgical History:  Procedure Laterality Date   APPENDECTOMY  BIOPSY  02/24/2022   Procedure: BIOPSY;  Surgeon: Rush Landmark Telford Nab., MD;  Location: [facility];  Service: Gastroenterology;;   CARDIAC CATHETERIZATION  2010   stents x2.   CATARACT EXTRACTION     CHOLECYSTECTOMY     COLONOSCOPY  2011   COLONOSCOPY N/A 10/19/2014   Procedure: COLONOSCOPY;  Surgeon: Rogene Houston, MD;  [facility];  Service: Endoscopy;  Laterality: N/A;  1030   CORONARY STENT INTERVENTION N/A 05/28/2021   Procedure: CORONARY STENT INTERVENTION;  Surgeon: Leonie Man, MD;   Location: [facility];  Service: Cardiovascular;  Laterality: N/A;   ESOPHAGEAL DILATION N/A 05/02/2020   Procedure: ESOPHAGEAL DILATION;  Surgeon: Rogene Houston, MD;  [facility];  Service: Gastroenterology;  Laterality: N/A;   ESOPHAGOGASTRODUODENOSCOPY N/A 10/26/2014   Procedure: ESOPHAGOGASTRODUODENOSCOPY (EGD);  Surgeon: Rogene Houston, MD;  [facility];  Service: Endoscopy;  Laterality: N/A;  90 - Dr. has lunch and learn   ESOPHAGOGASTRODUODENOSCOPY N/A 02/24/2022   Procedure: ESOPHAGOGASTRODUODENOSCOPY (EGD);  Surgeon: Irving Copas., MD;  Location: [facility];  Service: Gastroenterology;  Laterality: N/A;   ESOPHAGOGASTRODUODENOSCOPY (EGD) WITH PROPOFOL N/A 05/02/2020   Procedure: ESOPHAGOGASTRODUODENOSCOPY (EGD) WITH PROPOFOL;  Surgeon: Rogene Houston, MD;  [facility];  Service: Gastroenterology;  Laterality: N/A;  250   Esophagogastroduodenoscopy with esophageal dilation  2004, 2006, 2007   EUS N/A 02/24/2022   Procedure: UPPER ENDOSCOPIC ULTRASOUND (EUS) LINEAR;  Surgeon: Irving Copas., MD;  Location: [facility];  Service: Gastroenterology;  Laterality: N/A;   FINE NEEDLE ASPIRATION  02/24/2022   Procedure: FINE NEEDLE ASPIRATION;  Surgeon: Rush Landmark Telford Nab., MD;  Location: [facility];  Service: Gastroenterology;;  red path sent   GIVENS CAPSULE STUDY N/A 10/31/2014   Procedure: GIVENS CAPSULE STUDY;  Surgeon: Rogene Houston, MD;  [facility];  Service: Endoscopy;  Laterality: N/A;  730 -- pacemaker--needs monitoring--outpatient bed   INSERT / REPLACE / REMOVE PACEMAKER  2010   INTRAVASCULAR ULTRASOUND/IVUS N/A 05/28/2021   Procedure: Intravascular Ultrasound/IVUS;  Surgeon: Leonie Man, MD;  Location: [facility];  Service: Cardiovascular;  Laterality: N/A;   LEFT HEART CATH AND CORONARY ANGIOGRAPHY N/A 03/21/2021   Procedure: LEFT HEART CATH AND CORONARY ANGIOGRAPHY;  Surgeon: Belva Crome, MD;  Location: [facility];  Service: Cardiovascular;  Laterality: N/A;   LEFT HEART CATHETERIZATION WITH CORONARY ANGIOGRAM N/A 11/17/2011   Procedure: LEFT HEART CATHETERIZATION WITH CORONARY ANGIOGRAM;  Surgeon: Sherren Mocha, MD;  Location: [facility];  Service: Cardiovascular;  Laterality: N/A;   LEFT HEART CATHETERIZATION WITH CORONARY ANGIOGRAM N/A 09/26/2014   Procedure: LEFT HEART CATHETERIZATION WITH CORONARY ANGIOGRAM;  Surgeon: Leonie Man, MD;  Location: [facility];  Service: Cardiovascular;  Laterality: N/A;   PPM GENERATOR CHANGEOUT N/A 07/16/2022   Procedure: PPM GENERATOR CHANGEOUT;  Surgeon: Evans Lance, MD;  Location: [facility];  Service: Cardiovascular;  Laterality: N/A;   Right rotator cuff repair     SAVORY DILATION N/A 02/24/2022   Procedure: SAVORY DILATION;  Surgeon: Rush Landmark Telford Nab., MD;  Location: [facility];  Service: Gastroenterology;  Laterality: N/A;   SHOULDER ACROMIOPLASTY Right 05/30/2015   Procedure: RIGHT SHOULDER ACROMIOPLASTY;  Surgeon: Carole Civil, MD;  Location: [facility];  Service: Orthopedics;  Laterality: Right;   SHOULDER OPEN ROTATOR CUFF REPAIR Right 05/30/2015   Procedure: OPEN ROTATOR CUFF REPAIR RIGHT SHOULDER;  Surgeon: Carole Civil, MD;  Location: [facility];  Service: Orthopedics;  Laterality: Right;   SHOULDER OPEN ROTATOR CUFF REPAIR Left 10/22/2016   Procedure: ROTATOR CUFF REPAIR SHOULDER OPEN;  Surgeon: Carole Civil, MD;  Location: [facility];  Service: Orthopedics;  Laterality: Left;   Subxiphoid pericardial window  11/10   VAGINAL HYSTERECTOMY      Social History:  reports that she quit smoking about 33 years ago. Her smoking use included cigarettes. She has a 20.00 pack-year smoking history. She has been exposed to tobacco smoke. She has never used smokeless tobacco. She reports that she does not drink alcohol and does not use drugs.   Allergies  Allergen Reactions   Amitriptyline Hcl  Other (See Comments)    Caused "jaws to twist and lock"   Plavix [Clopidogrel Bisulfate] Hives   Sulfonamide Derivatives Other (See Comments)    UNKNOWN REACTION    Family History  Problem Relation Age of Onset   Cancer Mother        Colon    Coronary artery disease Sister    Coronary artery disease Brother    Arthritis Other    Lung disease Other    Asthma Other      Prior to Admission medications   Medication Sig Start Date End Date Taking? Authorizing Provider  albuterol (PROVENTIL) (2.5 MG/3ML) 0.083% nebulizer solution Take 3 mLs (2.5 mg total) by nebulization every 4 (four) hours as needed for wheezing or shortness of breath. 07/02/22 07/02/23  Roxan Hockey, MD  ALPRAZolam Duanne Moron) 1 MG tablet Take 1 mg by mouth 3 (three) times daily as needed for anxiety. 04/04/21   [provider]  amiodarone (PACERONE) 200 MG tablet TAKE 1 TABLET ONCE DAILY ON MONDAY THROUGH SATURDAY, NONE ON SUNDAY. 08/07/22   Evans Lance, MD  amLODipine (NORVASC) 5 MG tablet Take 1 tablet (5 mg total) by mouth daily. 07/02/22   Roxan Hockey, MD  aspirin EC 81 MG tablet Take 1 tablet (81 mg total) by mouth daily with breakfast. Swallow whole. 07/02/22   Roxan Hockey, MD  Cyanocobalamin (VITAMIN B-12) 5000 MCG SUBL Take 5,000 mcg by mouth daily.    [provider]  dicyclomine (BENTYL) 10 MG capsule Take 1 capsule (10 mg total) by mouth 2 (two) times daily as needed for spasms. 09/25/22   Carlan, Chelsea L, NP  furosemide (LASIX) 40 MG tablet Take 1 tablet (40 mg total) by mouth daily as needed for fluid or edema. 03/22/21   Florencia Reasons, MD  isosorbide mononitrate (IMDUR) 30 MG 24 hr tablet Take 1 tablet (30 mg total) by mouth daily. 07/02/22   Roxan Hockey, MD  JARDIANCE 25 MG TABS tablet Take 25 mg by mouth daily.    [provider]  metoprolol succinate (TOPROL-XL) 50 MG 24 hr tablet Take 1 tablet (50 mg total) by mouth 2 (two) times daily. Take with or immediately  following a meal. 07/02/22   Emokpae, Courage, MD  Multiple Vitamin (MULTIVITAMIN WITH MINERALS) TABS tablet Take 1 tablet by mouth daily.     [provider]  nitroGLYCERIN (NITROSTAT) 0.4 MG SL tablet Place 1 tablet (0.4 mg total) under the tongue every 5 (five) minutes x 3 doses as needed for chest pain. 03/22/21   Florencia Reasons, MD  OXYGEN Inhale 2 L into the lungs as needed (PRN as needed at night).    [provider]  pantoprazole (PROTONIX) 40 MG tablet TAKE (1) TABLET BY MOUTH ONCE DAILY. 03/17/22   Satira Sark, MD  potassium chloride (KLOR-CON) 10 MEQ tablet Take 1 tablet (10 mEq total) by mouth daily. Take While taking Lasix/furosemide 07/02/22   Roxan Hockey, MD  primidone (MYSOLINE) 50 MG tablet Take 50 mg by mouth at bedtime. 10/31/10   [provider]  PROAIR HFA 108 (90 Base) MCG/ACT inhaler Inhale 1 puff into the lungs every 4 (four) hours as needed for wheezing or shortness of breath.  11/10/18   [provider]  ranolazine (RANEXA) 500 MG 12 hr tablet Take 1 tablet (500 mg total) by mouth 2 (two) times daily. 07/02/22   Roxan Hockey, MD  rosuvastatin (CRESTOR) 40 MG tablet Take 1 tablet (40 mg total) by mouth daily. Patient not taking: Reported on 09/25/2022 07/02/22   Roxan Hockey, MD  senna-docusate (SENOKOT-S) 8.6-50 MG tablet Take 1 tablet by mouth at bedtime. Patient taking differently: Take 1-2 tablets by mouth daily as needed (constipation.). 03/22/21   Florencia Reasons, MD  ticagrelor (BRILINTA) 60 MG TABS tablet Take 1 tablet (60 mg total) by mouth 2 (two) times daily. 07/02/22   Roxan Hockey, MD  vitamin E 180 MG (400 UNITS) capsule Take 400 Units by mouth daily.    [provider]    Physical Exam: BP (!) 156/97   Pulse 82   Temp (!) 97.5 F (36.4 C) (Oral)   Resp 20   Ht 5\' 5"  (1.651 m)   Wt 62.1 kg   SpO2 99%   BMI 22.80 kg/m   General: 87 y.o. year-old female well developed well nourished in no acute  distress.  HEENT: NCAT, EOMI Neck: Supple, trachea medial Cardiovascular: Tachycardia, irregular rate and rhythm with no rubs or gallops.  No thyromegaly or JVD noted.  No lower extremity edema. 2/4 pulses in all 4 extremities. Respiratory: Clear to auscultation with no wheezes or rales. Good inspiratory effort. Abdomen: Soft, nontender nondistended with normal bowel sounds x4 quadrants. Muskuloskeletal: No cyanosis, clubbing or edema noted bilaterally Neuro: No focal neurological deficits noted.  Sensation, reflexes intact Skin: No ulcerative lesions noted or rashes Psychiatry: Mood is appropriate for condition and setting          Labs on Admission:  Basic Metabolic Panel: Recent Labs  Lab 10/27/22 2033  NA 139  K 3.4*  CL 104  CO2 22  GLUCOSE 133*  BUN 10  CREATININE 0.68  CALCIUM 9.8  MG 2.2   Liver Function Tests: Recent Labs  Lab 10/27/22 2033  AST 35  ALT 22  ALKPHOS 97  BILITOT 1.1  PROT 8.0  ALBUMIN 4.2   Recent Labs  Lab 10/27/22 2033  LIPASE 33   No results for input(s): "AMMONIA" in the last 168 hours. CBC: Recent Labs  Lab 10/27/22 2106  WBC 11.8*  HGB 14.3  HCT 43.5  MCV 91.0  PLT 204   Cardiac Enzymes: No results for input(s): "CKTOTAL", "CKMB", "CKMBINDEX", "TROPONINI" in the last 168 hours.  BNP (last 3 results) Recent Labs    06/29/22 1145  BNP 941.0*    ProBNP (last 3 results) No results for input(s): "PROBNP" in the last 8760 hours.  CBG: No results for input(s): "GLUCAP" in the last 168 hours.  Radiological Exams on Admission: DG Chest 2 View  Result Date: 10/27/2022 CLINICAL DATA:  Altered mental status EXAM: CHEST - 2 VIEW COMPARISON:  06/29/2022 FINDINGS: Left-sided pacing device as before. Cardiomegaly. Heterogeneous ground-glass opacities in the right upper lobe and left mid to lower lung. Aortic atherosclerosis. No pneumothorax. IMPRESSION: Heterogeneous ground-glass opacities in  the right upper lobe and left mid to  lower lung, suspect for multifocal pneumonia. Cardiomegaly. Electronically Signed   By: Donavan Foil M.D.   On: 10/27/2022 21:58   CT Head Wo Contrast  Result Date: 10/27/2022 CLINICAL DATA:  Altered mental status EXAM: CT HEAD WITHOUT CONTRAST TECHNIQUE: Contiguous axial images were obtained from the base of the skull through the vertex without intravenous contrast. RADIATION DOSE REDUCTION: This exam was performed according to the departmental dose-optimization program which includes automated exposure control, adjustment of the mA and/or kV according to patient size and/or use of iterative reconstruction technique. COMPARISON:  CT brain 12/11/2021 FINDINGS: Brain: No acute territorial infarction, hemorrhage or intracranial mass. Probable chronic lacunar infarct left basal ganglia. Patchy white matter hypodensity consistent with chronic small vessel ischemic change. Mild atrophy. Nonenlarged ventricles Vascular: No hyperdense vessels.  Carotid vascular calcification Skull: Normal. Negative for fracture or focal lesion. Sinuses/Orbits: No acute finding. Other: None IMPRESSION: 1. No CT evidence for acute intracranial abnormality. 2. Atrophy and chronic small vessel ischemic changes of the white matter. Electronically Signed   By: Donavan Foil M.D.   On: 10/27/2022 21:32    EKG: I independently viewed the EKG done and my findings are as followed: Sinus tachycardia with a regular rate of 108 bpm with QTc 507 ms  Assessment/Plan Present on Admission:  CAP (community acquired pneumonia)  Hypokalemia  Prolonged QT interval  Hypertensive urgency  Atrial fibrillation (HCC)  CAD (coronary artery disease)  BRADYCARDIA-TACHYCARDIA SYNDROME  Chronic respiratory failure with hypoxia (HCC)  COPD (chronic obstructive pulmonary disease) ([place])  Essential hypertension, benign  Chronic diastolic heart failure (HCC)  Principal Problem:   CAP (community acquired pneumonia) Active Problems:   COPD (chronic  obstructive pulmonary disease) (HCC)   Chronic respiratory failure with hypoxia (HCC)   Essential hypertension, benign   Atrial fibrillation (HCC)   BRADYCARDIA-TACHYCARDIA SYNDROME   Hypokalemia   Chronic diastolic heart failure (HCC)   Prolonged QT interval   Hypertensive urgency   CAD (coronary artery disease)   Paranoid delusion ([place])  Community-acquired pneumonia Patient was started on ceftriaxone and doxycycline, we shall continue same at this time with plan to de-escalate/discontinue based on blood culture, sputum culture, urine Legionella, strep pneumo and procalcitonin Continue Tylenol as needed Continue Mucinex, incentive spirometry, flutter valve   Paranoid delusional Patient was reported to stop taking all her medications because she states that she is also asked to stop taking her meds She also stated that she wants to die Telehealth psychiatry was consulted in the ED, unfortunately, patient was asleep and was unable to partake in teleassessment. Consider consulting telepsychiatry  Hypokalemia K+ 3.4, this will be replenished  Prolonged QT interval QTc 507 Avoid QT prolonging drugs Potassium was 3.4, this will be replenished Magnesium level will be checked Repeat EKG in the morning  Hypertensive urgency-resolved Essential hypertension Continue Toprol-XL Imdur, amlodipine Continue IV hydralazine 10 mg every 6 hours as needed for SBP > 170  Chronic atrial fibrillation s/p pacemaker implantation Continue aspirin, Toprol-XL Amiodarone currently held due to QT prolongation  Chronic diastolic CHF Echo from 0000000 with EF of 55 to 60%, no regional wall motion abnormalities Continue home meds  Tachycardia-bradycardia syndrome Patient has a permanent pacemaker and follows with Dr. Lovena Le per medical record  GERD Continue Protonix  CAD s/p stent placement Continue aspirin, Imdur, statin, Brilinta, Ranexa  Chronic respiratory failure with hypoxia Continue  home oxygen  COPD Continue Ventolin  ETHICS Palliative consult was placed  DVT  prophylaxis: Lovenox  Code Status: Full code  Consults: None  Family Communication: None at bedside  Severity of Illness: The appropriate patient status for this patient is INPATIENT. Inpatient status is judged to be reasonable and necessary in order to provide the required intensity of service to ensure the patient's safety. The patient's presenting symptoms, physical exam findings, and initial radiographic and laboratory data in the context of their chronic comorbidities is felt to place them at high risk for further clinical deterioration. Furthermore, it is not anticipated that the patient will be medically stable for discharge from the hospital within 2 midnights of admission.   * I certify that at the point of admission it is my clinical judgment that the patient will require inpatient hospital care spanning beyond 2 midnights from the point of admission due to high intensity of service, high risk for further deterioration and high frequency of surveillance required.*  Author: Bernadette Hoit, DO 10/28/2022 4:55 AM  For on call review [email or web address].

## 2022-10-27 NOTE — BH Assessment (Signed)
Clinician called ED secretary to see if patient could be assessed.  She asked RN Otho Ket who said patient had been given Benadryl and Ativan earlier.  The record shows pt was given Benadryl IM 12.5mg  at 20:47 and Ativan IM 1mg  at 20:47.  Pt is currently asleep, snoring w/ mouth agape and unable to participate in teleassessment.

## 2022-10-27 NOTE — ED Provider Notes (Incomplete)
[facility] Provider Note   CSN: [id number] Arrival date & time: 10/27/22  1437     History {Add pertinent medical, surgical, social history, OB history to HPI:1} Chief Complaint  Patient presents with   Hypertension    [name] is a 87 y.o. female.  87 year old female with a history of MI status post PCI, CHF, COPD, HTN, HLD, atrial fibrillation, and suspected pancreatic cancer who presents to the emergency department with hypertension.  Patient states that she is asymptomatic at this time and not having any new pains.  Per EMS, 911 was called due to patient's elevated blood pressure.  They report that she has stopped taking her medications and reports that she just came to her last night and told her to stop taking her medications.  She also told him she was ready to die.  She states that she lost her baby years ago on this date and is upset.  Unable to provide additional history.  Called her granddaughter April who says that Kelvin Cellar called her and said that she wanted to come to her house and die.  Reports that her grandmother lives by herself.  Has a neighbor that checks on her daily.  Says that she is very religious and thinks that she has been depressed from her heart problems and cancer and chronic nausea and vomiting.  Is unaware of any recent illnesses.  Patient does not have any history of dementia.        Home Medications Prior to Admission medications   Medication Sig Start Date End Date Taking? Authorizing Provider  albuterol (PROVENTIL) (2.5 MG/3ML) 0.083% nebulizer solution Take 3 mLs (2.5 mg total) by nebulization every 4 (four) hours as needed for wheezing or shortness of breath. 07/02/22 07/02/23  Roxan Hockey, MD  ALPRAZolam Duanne Moron) 1 MG tablet Take 1 mg by mouth 3 (three) times daily as needed for anxiety. 04/04/21   [provider]  amiodarone (PACERONE) 200 MG tablet TAKE 1 TABLET ONCE DAILY ON MONDAY  THROUGH SATURDAY, NONE ON SUNDAY. 08/07/22   Evans Lance, MD  amLODipine (NORVASC) 5 MG tablet Take 1 tablet (5 mg total) by mouth daily. 07/02/22   Roxan Hockey, MD  aspirin EC 81 MG tablet Take 1 tablet (81 mg total) by mouth daily with breakfast. Swallow whole. 07/02/22   Roxan Hockey, MD  Cyanocobalamin (VITAMIN B-12) 5000 MCG SUBL Take 5,000 mcg by mouth daily.    [provider]  dicyclomine (BENTYL) 10 MG capsule Take 1 capsule (10 mg total) by mouth 2 (two) times daily as needed for spasms. 09/25/22   Carlan, Chelsea L, NP  furosemide (LASIX) 40 MG tablet Take 1 tablet (40 mg total) by mouth daily as needed for fluid or edema. 03/22/21   Florencia Reasons, MD  isosorbide mononitrate (IMDUR) 30 MG 24 hr tablet Take 1 tablet (30 mg total) by mouth daily. 07/02/22   Roxan Hockey, MD  JARDIANCE 25 MG TABS tablet Take 25 mg by mouth daily.    [provider]  metoprolol succinate (TOPROL-XL) 50 MG 24 hr tablet Take 1 tablet (50 mg total) by mouth 2 (two) times daily. Take with or immediately following a meal. 07/02/22   Emokpae, Courage, MD  Multiple Vitamin (MULTIVITAMIN WITH MINERALS) TABS tablet Take 1 tablet by mouth daily.     [provider]  nitroGLYCERIN (NITROSTAT) 0.4 MG SL tablet Place 1 tablet (0.4 mg total) under the tongue every 5 (  five) minutes x 3 doses as needed for chest pain. 03/22/21   Florencia Reasons, MD  OXYGEN Inhale 2 L into the lungs as needed (PRN as needed at night).    [provider]  pantoprazole (PROTONIX) 40 MG tablet TAKE (1) TABLET BY MOUTH ONCE DAILY. 03/17/22   Satira Sark, MD  potassium chloride (KLOR-CON) 10 MEQ tablet Take 1 tablet (10 mEq total) by mouth daily. Take While taking Lasix/furosemide 07/02/22   Roxan Hockey, MD  primidone (MYSOLINE) 50 MG tablet Take 50 mg by mouth at bedtime. 10/31/10   [provider]  PROAIR HFA 108 (90 Base) MCG/ACT inhaler Inhale 1 puff into the lungs every 4 (four) hours as  needed for wheezing or shortness of breath.  11/10/18   [provider]  ranolazine (RANEXA) 500 MG 12 hr tablet Take 1 tablet (500 mg total) by mouth 2 (two) times daily. 07/02/22   Roxan Hockey, MD  rosuvastatin (CRESTOR) 40 MG tablet Take 1 tablet (40 mg total) by mouth daily. Patient not taking: Reported on 09/25/2022 07/02/22   Roxan Hockey, MD  senna-docusate (SENOKOT-S) 8.6-50 MG tablet Take 1 tablet by mouth at bedtime. Patient taking differently: Take 1-2 tablets by mouth daily as needed (constipation.). 03/22/21   Florencia Reasons, MD  ticagrelor (BRILINTA) 60 MG TABS tablet Take 1 tablet (60 mg total) by mouth 2 (two) times daily. 07/02/22   Roxan Hockey, MD  vitamin E 180 MG (400 UNITS) capsule Take 400 Units by mouth daily.    [provider]      Allergies    Amitriptyline hcl, Plavix [clopidogrel bisulfate], and Sulfonamide derivatives    Review of Systems   Review of Systems  Physical Exam Updated Vital Signs Ht 5\' 5"  (1.651 m)   Wt 62.1 kg   BMI 22.80 kg/m  Physical Exam Vitals and nursing note reviewed.  Constitutional:      General: She is not in acute distress.    Appearance: She is well-developed.     Comments: Alert and oriented x 3.  Tearful.  HENT:     Head: Normocephalic and atraumatic.     Right Ear: External ear normal.     Left Ear: External ear normal.     Nose: Nose normal.  Eyes:     Extraocular Movements: Extraocular movements intact.     Conjunctiva/sclera: Conjunctivae normal.     Pupils: Pupils are equal, round, and reactive to light.  Cardiovascular:     Rate and Rhythm: Tachycardia present. Rhythm irregular.     Heart sounds: No murmur heard. Pulmonary:     Effort: Pulmonary effort is normal. No respiratory distress.     Breath sounds: Normal breath sounds.  Abdominal:     General: Abdomen is flat. There is no distension.     Palpations: Abdomen is soft. There is no mass.     Tenderness: There is no abdominal  tenderness. There is no guarding.  Musculoskeletal:     Cervical back: Normal range of motion and neck supple.     Right lower leg: No edema.     Left lower leg: No edema.  Skin:    General: Skin is warm and dry.  Neurological:     General: No focal deficit present.     Mental Status: She is alert and oriented to person, place, and time. Mental status is at baseline.     Comments: Moving all 4 extremities equally     ED Results / Procedures /  Treatments   Labs (all labs ordered are listed, but only abnormal results are displayed) Labs Reviewed - No data to display  EKG None  Radiology No results found.  Procedures Procedures  {Document cardiac monitor, telemetry assessment procedure when appropriate:1}  Medications Ordered in ED Medications - No data to display  ED Course/ Medical Decision Making/ A&P   {   Click here for ABCD2, HEART and other calculatorsREFRESH Note before signing :1}                          Medical Decision Making Amount and/or Complexity of Data Reviewed Labs: ordered. Radiology: ordered.  Risk Prescription drug management.   ***  {Document critical care time when appropriate:1} {Document review of labs and clinical decision tools ie heart score, Chads2Vasc2 etc:1}  {Document your independent review of radiology images, and any outside records:1} {Document your discussion with family members, caretakers, and with consultants:1} {Document social determinants of health affecting pt's care:1} {Document your decision making why or why not admission, treatments were needed:1} Final Clinical Impression(s) / ED Diagnoses Final diagnoses:  None    Rx / DC Orders ED Discharge Orders     None

## 2022-10-27 NOTE — ED Triage Notes (Signed)
Pt brought in by [facility] for c/o hypertension. Pt told ems [name] came to her last night and told her to stop taking all her meds; pt told ems she is ready to die  Pt's friends told ems pt has a hx of cancer and has refused treatment  Pt having tremors during triage

## 2022-10-28 ENCOUNTER — Ambulatory Visit: Payer: 59 | Admitting: Internal Medicine

## 2022-10-28 ENCOUNTER — Encounter (HOSPITAL_COMMUNITY): Payer: Self-pay | Admitting: Internal Medicine

## 2022-10-28 DIAGNOSIS — J189 Pneumonia, unspecified organism: Secondary | ICD-10-CM | POA: Diagnosis not present

## 2022-10-28 DIAGNOSIS — F321 Major depressive disorder, single episode, moderate: Secondary | ICD-10-CM

## 2022-10-28 DIAGNOSIS — I159 Secondary hypertension, unspecified: Secondary | ICD-10-CM

## 2022-10-28 DIAGNOSIS — F22 Delusional disorders: Secondary | ICD-10-CM | POA: Diagnosis not present

## 2022-10-28 DIAGNOSIS — Z7189 Other specified counseling: Secondary | ICD-10-CM | POA: Diagnosis not present

## 2022-10-28 DIAGNOSIS — Z515 Encounter for palliative care: Secondary | ICD-10-CM | POA: Diagnosis not present

## 2022-10-28 LAB — COMPREHENSIVE METABOLIC PANEL
ALT: 20 U/L (ref 0–44)
AST: 30 U/L (ref 15–41)
Albumin: 3.8 g/dL (ref 3.5–5.0)
Alkaline Phosphatase: 88 U/L (ref 38–126)
Anion gap: 11 (ref 5–15)
BUN: 9 mg/dL (ref 8–23)
CO2: 24 mmol/L (ref 22–32)
Calcium: 9.3 mg/dL (ref 8.9–10.3)
Chloride: 106 mmol/L (ref 98–111)
Creatinine, Ser: 0.64 mg/dL (ref 0.44–1.00)
GFR, Estimated: >60 mL/min (ref >60)
Glucose, Bld: 105 mg/dL — ABNORMAL HIGH (ref 70–99)
Potassium: 3.2 mmol/L — ABNORMAL LOW (ref 3.5–5.1)
Sodium: 141 mmol/L (ref 135–145)
Total Bilirubin: 1 mg/dL (ref 0.3–1.2)
Total Protein: 7.3 g/dL (ref 6.5–8.1)

## 2022-10-28 LAB — CBC
HCT: 44 % (ref 36.0–46.0)
Hemoglobin: 14.3 g/dL (ref 12.0–15.0)
MCH: 30.1 pg (ref 26.0–34.0)
MCHC: 32.5 g/dL (ref 30.0–36.0)
MCV: 92.6 fL (ref 80.0–100.0)
Platelets: 199 10*3/uL (ref 150–400)
RBC: 4.75 MIL/uL (ref 3.87–5.11)
RDW: 14.6 % (ref 11.5–15.5)
WBC: 9.3 10*3/uL (ref 4.0–10.5)
nRBC: 0 % (ref 0.0–0.2)

## 2022-10-28 LAB — PHOSPHORUS: Phosphorus: 2.8 mg/dL (ref 2.5–4.6)

## 2022-10-28 LAB — MAGNESIUM: Magnesium: 2.2 mg/dL (ref 1.7–2.4)

## 2022-10-28 LAB — PROCALCITONIN: Procalcitonin: <0.1 ng/mL

## 2022-10-28 LAB — STREP PNEUMONIAE URINARY ANTIGEN: Strep Pneumo Urinary Antigen: NEGATIVE

## 2022-10-28 MED ORDER — TICAGRELOR 60 MG PO TABS
60.0000 mg | ORAL_TABLET | Freq: Two times a day (BID) | ORAL | Status: DC
  Administered 2022-10-28 – 2022-10-30 (×5): 60 mg via ORAL
  Filled 2022-10-28 (×8): qty 1

## 2022-10-28 MED ORDER — ACETAMINOPHEN 650 MG RE SUPP: 650.0000 mg | Freq: Four times a day (QID) | RECTAL | Status: DC | PRN

## 2022-10-28 MED ORDER — ALBUTEROL SULFATE (2.5 MG/3ML) 0.083% IN NEBU: 3.0000 mL | INHALATION_SOLUTION | RESPIRATORY_TRACT | Status: DC | PRN

## 2022-10-28 MED ORDER — ROSUVASTATIN CALCIUM 20 MG PO TABS
40.0000 mg | ORAL_TABLET | Freq: Every day | ORAL | Status: DC
  Administered 2022-10-28 – 2022-10-30 (×3): 40 mg via ORAL
  Filled 2022-10-28 (×3): qty 2

## 2022-10-28 MED ORDER — ISOSORBIDE MONONITRATE ER 30 MG PO TB24
30.0000 mg | ORAL_TABLET | Freq: Every day | ORAL | Status: DC
  Administered 2022-10-28 – 2022-10-30 (×3): 30 mg via ORAL
  Filled 2022-10-28 (×3): qty 1

## 2022-10-28 MED ORDER — RANOLAZINE ER 500 MG PO TB12
500.0000 mg | ORAL_TABLET | Freq: Two times a day (BID) | ORAL | Status: DC
  Administered 2022-10-28 – 2022-10-30 (×5): 500 mg via ORAL
  Filled 2022-10-28 (×5): qty 1

## 2022-10-28 MED ORDER — POTASSIUM CHLORIDE CRYS ER 20 MEQ PO TBCR
40.0000 meq | EXTENDED_RELEASE_TABLET | Freq: Once | ORAL | Status: AC
  Administered 2022-10-28: 40 meq via ORAL
  Filled 2022-10-28: qty 2

## 2022-10-28 MED ORDER — PANTOPRAZOLE SODIUM 40 MG PO TBEC
40.0000 mg | DELAYED_RELEASE_TABLET | Freq: Every day | ORAL | Status: DC
  Administered 2022-10-28 – 2022-10-30 (×3): 40 mg via ORAL
  Filled 2022-10-28 (×3): qty 1

## 2022-10-28 MED ORDER — PROCHLORPERAZINE EDISYLATE 10 MG/2ML IJ SOLN: 10.0000 mg | Freq: Four times a day (QID) | INTRAMUSCULAR | Status: DC | PRN

## 2022-10-28 MED ORDER — ACETAMINOPHEN 325 MG PO TABS: 650.0000 mg | ORAL_TABLET | Freq: Four times a day (QID) | ORAL | Status: DC | PRN

## 2022-10-28 MED ORDER — ENOXAPARIN SODIUM 40 MG/0.4ML IJ SOSY
40.0000 mg | PREFILLED_SYRINGE | INTRAMUSCULAR | Status: DC
  Administered 2022-10-28 – 2022-10-30 (×3): 40 mg via SUBCUTANEOUS
  Filled 2022-10-28 (×2): qty 0.4

## 2022-10-28 MED ORDER — POTASSIUM CHLORIDE 10 MEQ/100ML IV SOLN
10.0000 meq | INTRAVENOUS | Status: AC
  Administered 2022-10-28: 10 meq via INTRAVENOUS
  Filled 2022-10-28: qty 100

## 2022-10-28 MED ORDER — ALPRAZOLAM 1 MG PO TABS
1.0000 mg | ORAL_TABLET | Freq: Three times a day (TID) | ORAL | Status: DC | PRN
  Administered 2022-10-28 – 2022-10-30 (×4): 1 mg via ORAL
  Filled 2022-10-28 (×4): qty 1

## 2022-10-28 MED ORDER — HYDRALAZINE HCL 20 MG/ML IJ SOLN
10.0000 mg | Freq: Four times a day (QID) | INTRAMUSCULAR | Status: DC | PRN
  Administered 2022-10-28: 10 mg via INTRAVENOUS
  Filled 2022-10-28: qty 1

## 2022-10-28 MED ORDER — DM-GUAIFENESIN ER 30-600 MG PO TB12
1.0000 | ORAL_TABLET | Freq: Two times a day (BID) | ORAL | Status: DC
  Administered 2022-10-28 – 2022-10-30 (×5): 1 via ORAL
  Filled 2022-10-28 (×5): qty 1

## 2022-10-28 MED ORDER — ASPIRIN 81 MG PO TBEC
81.0000 mg | DELAYED_RELEASE_TABLET | Freq: Every day | ORAL | Status: DC
  Administered 2022-10-28 – 2022-10-30 (×3): 81 mg via ORAL
  Filled 2022-10-28 (×3): qty 1

## 2022-10-28 MED ORDER — DOXYCYCLINE HYCLATE 100 MG PO TABS
100.0000 mg | ORAL_TABLET | Freq: Two times a day (BID) | ORAL | Status: DC
  Administered 2022-10-28 – 2022-10-30 (×5): 100 mg via ORAL
  Filled 2022-10-28 (×5): qty 1

## 2022-10-28 MED ORDER — METOPROLOL SUCCINATE ER 50 MG PO TB24
50.0000 mg | ORAL_TABLET | Freq: Two times a day (BID) | ORAL | Status: DC
  Administered 2022-10-28 – 2022-10-30 (×5): 50 mg via ORAL
  Filled 2022-10-28 (×5): qty 1

## 2022-10-28 MED ORDER — AMLODIPINE BESYLATE 5 MG PO TABS
5.0000 mg | ORAL_TABLET | Freq: Every day | ORAL | Status: DC
  Administered 2022-10-28 – 2022-10-30 (×3): 5 mg via ORAL
  Filled 2022-10-28 (×3): qty 1

## 2022-10-28 NOTE — ED Notes (Signed)
ED TO INPATIENT HANDOFF REPORT  ED Nurse Name and Phone #: Terisa Starr Name/Age/Gender [name] 87 y.o. female Room/Bed: APA03/APA03  Code Status   Code Status: Full Code  Home/SNF/Other Home Patient oriented to: self, place, time, and situation Is this baseline? Yes   Triage Complete: Triage complete  Chief Complaint CAP (community acquired pneumonia) [J18.9]  Triage Note Pt brought in by [facility] for c/o hypertension. Pt told ems [name] came to her last night and told her to stop taking all her meds; pt told ems she is ready to die  Pt's friends told ems pt has a hx of cancer and has refused treatment  Pt having tremors during triage    Allergies Allergies  Allergen Reactions   Amitriptyline Hcl Other (See Comments)    Caused "jaws to twist and lock"   Plavix [Clopidogrel Bisulfate] Hives   Sulfonamide Derivatives Other (See Comments)    UNKNOWN REACTION    Level of Care/Admitting Diagnosis ED Disposition     ED Disposition  Admit   Condition  --   [place]: [facility] [id number]  Level of Care: Telemetry [5]  Covid Evaluation: Asymptomatic - no recent exposure (last 10 days) testing not required  Diagnosis: CAP (community acquired pneumonia) [id number]  Admitting Physician: Bernadette Hoit [id number]  Attending Physician: Bernadette Hoit [id number]  Certification:: I certify this patient will need inpatient services for at least 2 midnights          B Medical/Surgery History Past Medical History:  Diagnosis Date   Anemia    Anxiety    Atrial fibrillation    Chronic back pain    COPD (chronic obstructive pulmonary disease) ([place])    Coronary atherosclerosis of native coronary artery    DES x 2 to RCA 10/10   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    Essential hypertension    GERD (gastroesophageal reflux disease)    H/O hiatal hernia    Headache(784.0)    HOH (hard of hearing)    Hyperlipidemia     Neuromuscular disorder ([place])    Tremors   Pericardial effusion    Hemorrhagic    Presence of permanent cardiac pacemaker    Tachycardia-bradycardia syndrome ([place])    s/p Medtronic Adapta L dual chamber device  5/10   Past Surgical History:  Procedure Laterality Date   APPENDECTOMY     BIOPSY  02/24/2022   Procedure: BIOPSY;  Surgeon: Irving Copas., MD;  [facility];  Service: Gastroenterology;;   CARDIAC CATHETERIZATION  2010   stents x2.   CATARACT EXTRACTION     CHOLECYSTECTOMY     COLONOSCOPY  2011   COLONOSCOPY N/A 10/19/2014   Procedure: COLONOSCOPY;  Surgeon: Rogene Houston, MD;  [facility];  Service: Endoscopy;  Laterality: N/A;  1030   CORONARY STENT INTERVENTION N/A 05/28/2021   Procedure: CORONARY STENT INTERVENTION;  Surgeon: Leonie Man, MD;  Location: [place] [facility];  Service: Cardiovascular;  Laterality: N/A;   ESOPHAGEAL DILATION N/A 05/02/2020   Procedure: ESOPHAGEAL DILATION;  Surgeon: Rogene Houston, MD;  [facility];  Service: Gastroenterology;  Laterality: N/A;   ESOPHAGOGASTRODUODENOSCOPY N/A 10/26/2014   Procedure: ESOPHAGOGASTRODUODENOSCOPY (EGD);  Surgeon: Rogene Houston, MD;  [facility];  Service: Endoscopy;  Laterality: N/A;  20 - Dr. has lunch and learn   ESOPHAGOGASTRODUODENOSCOPY N/A 02/24/2022   Procedure: ESOPHAGOGASTRODUODENOSCOPY (EGD);  Surgeon: Irving Copas., MD;  [facility];  Service: Gastroenterology;  Laterality: N/A;   ESOPHAGOGASTRODUODENOSCOPY (EGD) WITH PROPOFOL N/A 05/02/2020   Procedure: ESOPHAGOGASTRODUODENOSCOPY (EGD) WITH PROPOFOL;  Surgeon: Rogene Houston, MD;  [facility];  Service: Gastroenterology;  Laterality: N/A;  250   Esophagogastroduodenoscopy with esophageal dilation  2004, 2006, 2007   EUS N/A 02/24/2022   Procedure: UPPER ENDOSCOPIC ULTRASOUND (EUS) LINEAR;  Surgeon: Irving Copas., MD;  [facility];   Service: Gastroenterology;  Laterality: N/A;   FINE NEEDLE ASPIRATION  02/24/2022   Procedure: FINE NEEDLE ASPIRATION;  Surgeon: Rush Landmark Telford Nab., MD;  Location: [facility];  Service: Gastroenterology;;  red path sent   GIVENS CAPSULE STUDY N/A 10/31/2014   Procedure: GIVENS CAPSULE STUDY;  Surgeon: Rogene Houston, MD;  [facility];  Service: Endoscopy;  Laterality: N/A;  730 -- pacemaker--needs monitoring--outpatient bed   INSERT / REPLACE / REMOVE PACEMAKER  2010   INTRAVASCULAR ULTRASOUND/IVUS N/A 05/28/2021   Procedure: Intravascular Ultrasound/IVUS;  Surgeon: Leonie Man, MD;  Location: [place] [facility];  Service: Cardiovascular;  Laterality: N/A;   LEFT HEART CATH AND CORONARY ANGIOGRAPHY N/A 03/21/2021   Procedure: LEFT HEART CATH AND CORONARY ANGIOGRAPHY;  Surgeon: Belva Crome, MD;  Location: [place] [facility];  Service: Cardiovascular;  Laterality: N/A;   LEFT HEART CATHETERIZATION WITH CORONARY ANGIOGRAM N/A 11/17/2011   Procedure: LEFT HEART CATHETERIZATION WITH CORONARY ANGIOGRAM;  Surgeon: Sherren Mocha, MD;  Location: [facility] CATH LAB;  Service: Cardiovascular;  Laterality: N/A;   LEFT HEART CATHETERIZATION WITH CORONARY ANGIOGRAM N/A 09/26/2014   Procedure: LEFT HEART CATHETERIZATION WITH CORONARY ANGIOGRAM;  Surgeon: Leonie Man, MD;  Location: [facility] CATH LAB;  Service: Cardiovascular;  Laterality: N/A;   PPM GENERATOR CHANGEOUT N/A 07/16/2022   Procedure: PPM GENERATOR CHANGEOUT;  Surgeon: Evans Lance, MD;  Location: [place] [facility];  Service: Cardiovascular;  Laterality: N/A;   Right rotator cuff repair     SAVORY DILATION N/A 02/24/2022   Procedure: SAVORY DILATION;  Surgeon: Rush Landmark Telford Nab., MD;  [facility];  Service: Gastroenterology;  Laterality: N/A;   SHOULDER ACROMIOPLASTY Right 05/30/2015   Procedure: RIGHT SHOULDER ACROMIOPLASTY;  Surgeon: Carole Civil, MD;  [facility];  Service: Orthopedics;  Laterality:  Right;   SHOULDER OPEN ROTATOR CUFF REPAIR Right 05/30/2015   Procedure: OPEN ROTATOR CUFF REPAIR RIGHT SHOULDER;  Surgeon: Carole Civil, MD;  [facility];  Service: Orthopedics;  Laterality: Right;   SHOULDER OPEN ROTATOR CUFF REPAIR Left 10/22/2016   Procedure: ROTATOR CUFF REPAIR SHOULDER OPEN;  Surgeon: Carole Civil, MD;  [facility];  Service: Orthopedics;  Laterality: Left;   Subxiphoid pericardial window  11/10   VAGINAL HYSTERECTOMY       A IV Location/Drains/Wounds Patient Lines/Drains/Airways Status     Active Line/Drains/Airways     Name Placement date Placement time Site Days   Peripheral IV 10/27/22 22 G Anterior;Distal;Right Forearm 10/27/22  2043  Forearm  1            Intake/Output Last 24 hours No intake or output data in the 24 hours ending 10/28/22 0737  Labs/Imaging Results for orders placed or performed during the hospital encounter of 10/27/22 (from the past 48 hour(s))  Urinalysis, Routine w reflex microscopic -Urine, Clean Catch     Status: Abnormal   Collection Time: 10/27/22  8:31 PM  Result Value Ref Range   Color, Urine YELLOW YELLOW   APPearance CLEAR CLEAR  Specific Gravity, Urine 1.006 1.005 - 1.030   pH 7.0 5.0 - 8.0   Glucose, UA NEGATIVE NEGATIVE mg/dL   Hgb urine dipstick NEGATIVE NEGATIVE   Bilirubin Urine NEGATIVE NEGATIVE   Ketones, ur NEGATIVE NEGATIVE mg/dL   Protein, ur 100 (A) NEGATIVE mg/dL   Nitrite NEGATIVE NEGATIVE   Leukocytes,Ua SMALL (A) NEGATIVE   RBC / HPF 0-5 0 - 5 RBC/hpf   WBC, UA 0-5 0 - 5 WBC/hpf   Bacteria, UA NONE SEEN NONE SEEN   Squamous Epithelial / HPF 0-5 0 - 5 /HPF    Comment: Performed at [facility], [address] [place], [place] [address]  Rapid urine drug screen (hospital performed)     Status: Abnormal   Collection Time: 10/27/22  8:31 PM  Result Value Ref Range   Opiates NONE DETECTED NONE DETECTED   Cocaine NONE DETECTED NONE DETECTED   Benzodiazepines POSITIVE (A) NONE  DETECTED   Amphetamines NONE DETECTED NONE DETECTED   Tetrahydrocannabinol NONE DETECTED NONE DETECTED   Barbiturates NONE DETECTED NONE DETECTED    Comment: (NOTE) DRUG SCREEN FOR MEDICAL PURPOSES ONLY.  IF CONFIRMATION IS NEEDED FOR ANY PURPOSE, NOTIFY LAB WITHIN 5 DAYS.  LOWEST DETECTABLE LIMITS FOR URINE DRUG SCREEN Drug Class                     Cutoff (ng/mL) Amphetamine and metabolites    1000 Barbiturate and metabolites    200 Benzodiazepine                 200 Opiates and metabolites        300 Cocaine and metabolites        300 THC                            50 Performed at [facility], [address], [place], [place] [address]   Comprehensive metabolic panel     Status: Abnormal   Collection Time: 10/27/22  8:33 PM  Result Value Ref Range   Sodium 139 135 - 145 mmol/L   Potassium 3.4 (L) 3.5 - 5.1 mmol/L   Chloride 104 98 - 111 mmol/L   CO2 22 22 - 32 mmol/L   Glucose, Bld 133 (H) 70 - 99 mg/dL    Comment: Glucose reference range applies only to samples taken after fasting for at least 8 hours.   BUN 10 8 - 23 mg/dL   Creatinine, Ser 0.68 0.44 - 1.00 mg/dL   Calcium 9.8 8.9 - 10.3 mg/dL   Total Protein 8.0 6.5 - 8.1 g/dL   Albumin 4.2 3.5 - 5.0 g/dL   AST 35 15 - 41 U/L   ALT 22 0 - 44 U/L   Alkaline Phosphatase 97 38 - 126 U/L   Total Bilirubin 1.1 0.3 - 1.2 mg/dL   GFR, Estimated >60 >60 mL/min    Comment: (NOTE) Calculated using the CKD-EPI Creatinine Equation (2021)    Anion gap 13 5 - 15    Comment: Performed at [facility], [address], [place], [place] [address]  Lipase, blood     Status: None   Collection Time: 10/27/22  8:33 PM  Result Value Ref Range   Lipase 33 11 - 51 U/L    Comment: Performed at [facility], [address] [place], [place] [address]  Magnesium     Status: None   Collection Time: 10/27/22  8:33 PM  Result  Value Ref Range   Magnesium 2.2 1.7 - 2.4 mg/dL    Comment: Performed at [facility], [address],  [place], [place] [address]  Ethanol     Status: None   Collection Time: 10/27/22  8:33 PM  Result Value Ref Range   Alcohol, Ethyl (B) <10 <10 mg/dL    Comment: (NOTE) Lowest detectable limit for serum alcohol is 10 mg/dL.  For medical purposes only. Performed at [facility], [address] [place], [place] [address]   CBC     Status: Abnormal   Collection Time: 10/27/22  9:06 PM  Result Value Ref Range   WBC 11.8 (H) 4.0 - 10.5 K/uL   RBC 4.78 3.87 - 5.11 MIL/uL   Hemoglobin 14.3 12.0 - 15.0 g/dL   HCT 43.5 36.0 - 46.0 %   MCV 91.0 80.0 - 100.0 fL   MCH 29.9 26.0 - 34.0 pg   MCHC 32.9 30.0 - 36.0 g/dL   RDW 14.6 11.5 - 15.5 %   Platelets 204 150 - 400 K/uL   nRBC 0.0 0.0 - 0.2 %    Comment: Performed at [facility], [address] [place], [place] [address]  Blood culture (routine x 2)     Status: None (Preliminary result)   Collection Time: 10/27/22 11:53 PM   Specimen: BLOOD  Result Value Ref Range   Specimen Description BLOOD BLOOD RIGHT ARM    Special Requests      BOTTLES DRAWN AEROBIC AND ANAEROBIC Blood Culture adequate volume Performed at [facility], [address] [place], [place] [address]    Culture PENDING    Report Status PENDING   Blood culture (routine x 2)     Status: None (Preliminary result)   Collection Time: 10/27/22 11:53 PM   Specimen: BLOOD  Result Value Ref Range   Specimen Description BLOOD BLOOD RIGHT HAND    Special Requests      BOTTLES DRAWN AEROBIC AND ANAEROBIC Blood Culture results may not be optimal due to an excessive volume of blood received in culture bottles Performed at [facility], [address], [place], [place] [address]    Culture PENDING    Report Status PENDING   Comprehensive metabolic panel     Status: Abnormal   Collection Time: 10/28/22  5:01 AM  Result Value Ref Range   Sodium 141 135 - 145 mmol/L   Potassium 3.2 (L) 3.5 - 5.1 mmol/L   Chloride 106 98 - 111 mmol/L   CO2 24 22 - 32 mmol/L   Glucose, Bld 105 (H) 70 -  99 mg/dL    Comment: Glucose reference range applies only to samples taken after fasting for at least 8 hours.   BUN 9 8 - 23 mg/dL   Creatinine, Ser 0.64 0.44 - 1.00 mg/dL   Calcium 9.3 8.9 - 10.3 mg/dL   Total Protein 7.3 6.5 - 8.1 g/dL   Albumin 3.8 3.5 - 5.0 g/dL   AST 30 15 - 41 U/L   ALT 20 0 - 44 U/L   Alkaline Phosphatase 88 38 - 126 U/L   Total Bilirubin 1.0 0.3 - 1.2 mg/dL   GFR, Estimated >60 >60 mL/min    Comment: (NOTE) Calculated using the CKD-EPI Creatinine Equation (2021)    Anion gap 11 5 - 15    Comment: Performed at [facility], [address], [place],  [address]  CBC     Status: None   Collection Time: 10/28/22  5:01 AM  Result Value Ref Range   WBC 9.3 4.0 - 10.5 K/uL   RBC 4.75 3.87 - 5.11 MIL/uL   Hemoglobin 14.3 12.0 - 15.0 g/dL   HCT 44.0 36.0 - 46.0 %   MCV 92.6 80.0 - 100.0 fL   MCH 30.1 26.0 - 34.0 pg   MCHC 32.5 30.0 - 36.0 g/dL   RDW 14.6 11.5 - 15.5 %   Platelets 199 150 - 400 K/uL   nRBC 0.0 0.0 - 0.2 %    Comment: Performed at [facility], [address] [place], [place] [address]  Magnesium     Status: None   Collection Time: 10/28/22  5:01 AM  Result Value Ref Range   Magnesium 2.2 1.7 - 2.4 mg/dL    Comment: Performed at [facility], [address] [place], [place] [address]  Phosphorus     Status: None   Collection Time: 10/28/22  5:01 AM  Result Value Ref Range   Phosphorus 2.8 2.5 - 4.6 mg/dL    Comment: Performed at [facility], [address] [place], [place] [address]   DG Chest 2 View  Result Date: 10/27/2022 CLINICAL DATA:  Altered mental status EXAM: CHEST - 2 VIEW COMPARISON:  06/29/2022 FINDINGS: Left-sided pacing device as before. Cardiomegaly. Heterogeneous ground-glass opacities in the right upper lobe and left mid to lower lung. Aortic atherosclerosis. No pneumothorax. IMPRESSION: Heterogeneous ground-glass opacities in the right upper lobe and left mid to lower lung, suspect for multifocal pneumonia.  Cardiomegaly. Electronically Signed   By: Donavan Foil M.D.   On: 10/27/2022 21:58   CT Head Wo Contrast  Result Date: 10/27/2022 CLINICAL DATA:  Altered mental status EXAM: CT HEAD WITHOUT CONTRAST TECHNIQUE: Contiguous axial images were obtained from the base of the skull through the vertex without intravenous contrast. RADIATION DOSE REDUCTION: This exam was performed according to the departmental dose-optimization program which includes automated exposure control, adjustment of the mA and/or kV according to patient size and/or use of iterative reconstruction technique. COMPARISON:  CT brain 12/11/2021 FINDINGS: Brain: No acute territorial infarction, hemorrhage or intracranial mass. Probable chronic lacunar infarct left basal ganglia. Patchy white matter hypodensity consistent with chronic small vessel ischemic change. Mild atrophy. Nonenlarged ventricles Vascular: No hyperdense vessels.  Carotid vascular calcification Skull: Normal. Negative for fracture or focal lesion. Sinuses/Orbits: No acute finding. Other: None IMPRESSION: 1. No CT evidence for acute intracranial abnormality. 2. Atrophy and chronic small vessel ischemic changes of the white matter. Electronically Signed   By: Donavan Foil M.D.   On: 10/27/2022 21:32    Pending Labs Unresulted Labs (From admission, onward)     Start     Ordered   11/04/22 0500  Creatinine, serum  (enoxaparin (LOVENOX)    CrCl >/= 30 ml/min)  Weekly,   R     Comments: while on enoxaparin therapy    10/28/22 0424   10/28/22 0420  Legionella Pneumophila Serogp 1 Ur Ag  (COPD / Pneumonia / Cellulitis / Lower Extremity Wound)  Once,   R        10/28/22 0424   10/28/22 0420  Strep pneumoniae urinary antigen  (COPD / Pneumonia / Cellulitis / Lower Extremity Wound)  Once,   R        10/28/22 0424   10/28/22 0420  Expectorated Sputum Assessment w Gram Stain, Rflx to Resp Cult  (COPD / Pneumonia / Cellulitis / Lower Extremity Wound)  Once,   R        10/28/22  0424            Vitals/Pain Today's Vitals   10/28/22 0530 10/28/22 0600 10/28/22 0630 10/28/22 0655  BP: (!) 165/70 (!) 145/69 139/72   Pulse: (!) 103 98 93 97  Resp: 20 (!) 23 20 19   Temp:      TempSrc:      SpO2: 99% 98% 96% 96%  Weight:      Height:      PainSc:        Isolation Precautions No active isolations  Medications Medications  doxycycline (VIBRAMYCIN) 100 mg in sodium chloride 0.9 % 250 mL IVPB (0 mg Intravenous Stopped 10/28/22 0343)  enoxaparin (LOVENOX) injection 40 mg (has no administration in time range)  acetaminophen (TYLENOL) tablet 650 mg (has no administration in time range)    Or  acetaminophen (TYLENOL) suppository 650 mg (has no administration in time range)  prochlorperazine (COMPAZINE) injection 10 mg (has no administration in time range)  dextromethorphan-guaiFENesin (MUCINEX DM) 30-600 MG per 12 hr tablet 1 tablet (has no administration in time range)  potassium chloride 10 mEq in 100 mL IVPB (10 mEq Intravenous Patient Refused/Not Given 10/28/22 0602)  hydrALAZINE (APRESOLINE) injection 10 mg (10 mg Intravenous Given 10/28/22 0519)  metoprolol tartrate (LOPRESSOR) injection 5 mg (5 mg Intravenous Given 10/27/22 2226)  diphenhydrAMINE (BENADRYL) injection 12.5 mg (12.5 mg Intravenous Given 10/27/22 2047)  LORazepam (ATIVAN) injection 1 mg (1 mg Intravenous Given 10/27/22 2047)  cefTRIAXone (ROCEPHIN) 2 g in sodium chloride 0.9 % 100 mL IVPB (0 g Intravenous Stopped 10/28/22 0041)    Mobility walks with device     Focused Assessments Neuro Assessment Handoff:  Swallow screen pass?  N/a         Neuro Assessment:   Neuro Checks:      Has TPA been given? No If patient is a Neuro Trauma and patient is going to OR before floor call report to [place] nurse: [phone] or [phone]   R Recommendations: See Admitting Provider Note  Report given to:   Additional Notes: Pt is refusing all care but needs Palliative Care consult.

## 2022-10-28 NOTE — Progress Notes (Addendum)
PROGRESS NOTE    [name]  [id number] DOB: 04-22-1936 DOA: 10/27/2022 PCP: Celene Squibb, MD    Brief Narrative:   [name] is a 87 y.o. female with medical history significant of chronic respiratory failure on supplemental oxygen at 2 LPM at night, COPD, atrial fibrillation, CHF, suspected pancreatic cancer, tachycardia-bradycardia syndrome s/p pacemaker, CAD who presents to the emergency department.  Patient was unable to provide a history due to altered mental status, she was suspicious of staff wanting to kill her.  History was obtained from ED physician and ED medical record.  Per report, family noted that patient's BP was elevated, apparently she stopped taking her medications because she states that she saw her usual symptoms last night and told to stop taking all her meds and she told EMS team that she was ready to die.  Patient states that she lost her baby years ago on this date and was upset.  ED physician states that if called patient's granddaughter who told him that patient called and said that she wanted to come to die in her house. Patient lives alone and a neighbor checks on her daily.  Granddaughter states that patient was very religious and thinks that she has been depressed due to cancer, heart problems, nausea and vomiting.  There was no reported history of dementia.   Assessment and Plan: Community-acquired pneumonia Patient was started on ceftriaxone and doxycycline-- will change to PO doxy to see if patient will take as currently refusing -procalcitonin -Continue Tylenol as needed -Continue Mucinex, incentive spirometry, flutter valve    Paranoid delusional Patient was reported to stop taking all her medications because she states that she is also asked to stop taking her meds She also stated that she wants to die Telehealth psychiatry was consulted in the ED, unfortunately, patient was asleep and was unable to partake in teleassessment. -consulting  telepsychiatry  -this weekend patient was normal per neighbor (some baseline paranoia but not to this extent)  Hypokalemia -replace   Prolonged QT interval QTc 507 Avoid QT prolonging drugs Replace K   Hypertensive urgency-resolved Continue Toprol-XL Imdur, amlodipine Continue IV hydralazine 10 mg every 6 hours as needed for SBP > 170   Chronic atrial fibrillation s/p pacemaker implantation Continue aspirin, Toprol-XL Amiodarone currently held due to QT prolongation   Suspected pancreatic cancer -last GI note: mixed type IPMN. We discussed the possibility that this lesion may become a cancer in the future but also we discussed that she had multiple comorbidities and has advanced age which make her high risk surgical candidate for a high risk surgery. We discussed the benefits and possible risk of proceeding with a Whipple surgery if she were to have worsening of her pancreatic lesions. For now, we will continue the current recommendation for surveillance as discussed in the multidisciplinary tumor board. She will have to have a CT pancreas in March 2024.   Chronic diastolic CHF Echo from 0000000 with EF of 55 to 60%, no regional wall motion abnormalities Continue home meds   Tachycardia-bradycardia syndrome Patient has a permanent pacemaker and follows with Dr. Lovena Le per medical record   GERD Continue Protonix   CAD s/p stent placement Continue aspirin, Imdur, statin, Brilinta, Ranexa   Chronic respiratory failure with hypoxia Continue home oxygen   COPD Continue Ventolin   Anxiety -on xanax at home-- will continue  Tremor -has had long-time  Palliative consult was placed    DVT prophylaxis: enoxaparin (LOVENOX) injection 40 mg Start:  10/28/22 1000 SCDs Start: 10/28/22 0417    Code Status: DNR Family Communication: called granddaughter, no answer, called neighbor   Disposition Plan:  Level of care: Telemetry Status is: Inpatient Remains inpatient  appropriate because: needs palliative care and psych consult    Consultants:  Psych Palliative care   Subjective: Does not think she has a PNA and is refusing to take any antibiotics-- will take her potassium Would like to see Dr. Lovena Le  Objective: Vitals:   10/28/22 0655 10/28/22 0700 10/28/22 0730 10/28/22 0800  BP:  (!) 140/63 120/73 (!) 154/87  Pulse: 97 93 97 97  Resp: 19 (!) 31 (!) 24 19  Temp:      TempSrc:      SpO2: 96% 97% 97% 99%  Weight:      Height:       No intake or output data in the 24 hours ending 10/28/22 1306 Filed Weights   10/27/22 1445  Weight: 62.1 kg    Examination:   General: Appearance:    Elderly female in no acute distress     Lungs:     Clear to auscultation bilaterally, respirations unlabored  Heart:    Normal heart rate. irregular   MS:   All extremities are intact.    Neurologic:   Oriented to person, tremulous, very hard of hearing       Data Reviewed: I have personally reviewed following labs and imaging studies  CBC: Recent Labs  Lab 10/27/22 2106 10/28/22 0501  WBC 11.8* 9.3  HGB 14.3 14.3  HCT 43.5 44.0  MCV 91.0 92.6  PLT 204 [id number]   Basic Metabolic Panel: Recent Labs  Lab 10/27/22 2033 10/28/22 0501  NA 139 141  K 3.4* 3.2*  CL 104 106  CO2 22 24  GLUCOSE 133* 105*  BUN 10 9  CREATININE 0.68 0.64  CALCIUM 9.8 9.3  MG 2.2 2.2  PHOS  --  2.8   GFR: Estimated Creatinine Clearance: 46.3 mL/min (by C-G formula based on SCr of 0.64 mg/dL). Liver Function Tests: Recent Labs  Lab 10/27/22 2033 10/28/22 0501  AST 35 30  ALT 22 20  ALKPHOS 97 88  BILITOT 1.1 1.0  PROT 8.0 7.3  ALBUMIN 4.2 3.8   Recent Labs  Lab 10/27/22 2033  LIPASE 33   No results for input(s): "AMMONIA" in the last 168 hours. Coagulation Profile: No results for input(s): "INR", "PROTIME" in the last 168 hours. Cardiac Enzymes: No results for input(s): "CKTOTAL", "CKMB", "CKMBINDEX", "TROPONINI" in the last 168 hours. BNP  (last 3 results) No results for input(s): "PROBNP" in the last 8760 hours. HbA1C: No results for input(s): "HGBA1C" in the last 72 hours. CBG: No results for input(s): "GLUCAP" in the last 168 hours. Lipid Profile: No results for input(s): "CHOL", "HDL", "LDLCALC", "TRIG", "CHOLHDL", "LDLDIRECT" in the last 72 hours. Thyroid Function Tests: No results for input(s): "TSH", "T4TOTAL", "FREET4", "T3FREE", "THYROIDAB" in the last 72 hours. Anemia Panel: No results for input(s): "VITAMINB12", "FOLATE", "FERRITIN", "TIBC", "IRON", "RETICCTPCT" in the last 72 hours. Sepsis Labs: Recent Labs  Lab 10/28/22 0600  PROCALCITON <0.10    Recent Results (from the past 240 hour(s))  Blood culture (routine x 2)     Status: None (Preliminary result)   Collection Time: 10/27/22 11:53 PM   Specimen: BLOOD  Result Value Ref Range Status   Specimen Description BLOOD BLOOD RIGHT ARM  Final   Special Requests   Final    BOTTLES DRAWN AEROBIC AND  ANAEROBIC Blood Culture adequate volume   Culture   Final    NO GROWTH < 12 HOURS Performed at [facility], [address], [place], [place] [address]    Report Status PENDING  Incomplete  Blood culture (routine x 2)     Status: None (Preliminary result)   Collection Time: 10/27/22 11:53 PM   Specimen: BLOOD  Result Value Ref Range Status   Specimen Description BLOOD BLOOD RIGHT HAND  Final   Special Requests   Final    BOTTLES DRAWN AEROBIC AND ANAEROBIC Blood Culture results may not be optimal due to an excessive volume of blood received in culture bottles   Culture   Final    NO GROWTH < 12 HOURS Performed at [facility], [address], [place], [place] [address]    Report Status PENDING  Incomplete         Radiology Studies: DG Chest 2 View  Result Date: 10/27/2022 CLINICAL DATA:  Altered mental status EXAM: CHEST - 2 VIEW COMPARISON:  06/29/2022 FINDINGS: Left-sided pacing device as before. Cardiomegaly. Heterogeneous ground-glass opacities  in the right upper lobe and left mid to lower lung. Aortic atherosclerosis. No pneumothorax. IMPRESSION: Heterogeneous ground-glass opacities in the right upper lobe and left mid to lower lung, suspect for multifocal pneumonia. Cardiomegaly. Electronically Signed   By: Donavan Foil M.D.   On: 10/27/2022 21:58   CT Head Wo Contrast  Result Date: 10/27/2022 CLINICAL DATA:  Altered mental status EXAM: CT HEAD WITHOUT CONTRAST TECHNIQUE: Contiguous axial images were obtained from the base of the skull through the vertex without intravenous contrast. RADIATION DOSE REDUCTION: This exam was performed according to the departmental dose-optimization program which includes automated exposure control, adjustment of the mA and/or kV according to patient size and/or use of iterative reconstruction technique. COMPARISON:  CT brain 12/11/2021 FINDINGS: Brain: No acute territorial infarction, hemorrhage or intracranial mass. Probable chronic lacunar infarct left basal ganglia. Patchy white matter hypodensity consistent with chronic small vessel ischemic change. Mild atrophy. Nonenlarged ventricles Vascular: No hyperdense vessels.  Carotid vascular calcification Skull: Normal. Negative for fracture or focal lesion. Sinuses/Orbits: No acute finding. Other: None IMPRESSION: 1. No CT evidence for acute intracranial abnormality. 2. Atrophy and chronic small vessel ischemic changes of the white matter. Electronically Signed   By: Donavan Foil M.D.   On: 10/27/2022 21:32        Scheduled Meds:  amLODipine  5 mg Oral Daily   aspirin EC  81 mg Oral Q breakfast   dextromethorphan-guaiFENesin  1 tablet Oral BID   doxycycline  100 mg Oral Q12H   enoxaparin (LOVENOX) injection  40 mg Subcutaneous Q24H   isosorbide mononitrate  30 mg Oral Daily   metoprolol succinate  50 mg Oral BID   pantoprazole  40 mg Oral Daily   ranolazine  500 mg Oral BID   rosuvastatin  40 mg Oral Daily   ticagrelor  60 mg Oral BID   Continuous  Infusions:     LOS: 1 day    Time spent: 45 minutes spent on chart review, discussion with nursing staff, consultants, updating family and interview/physical exam; more than 50% of that time was spent in counseling and/or coordination of care.    Geradine Girt, DO [facility] Available via Epic secure chat 7am-7pm After these hours, please refer to coverage provider listed on [email or web address] 10/28/2022, 1:06 PM

## 2022-10-28 NOTE — BH Assessment (Addendum)
Comprehensive Clinical Assessment (CCA) Note  10/28/2022 [name] [id number]  Disposition: Per Tharon Aquas, NP patient is psychiatrically cleared.    The patient demonstrates the following risk factors for suicide: Chronic risk factors for suicide include: medical illness multiple medical issues . Acute risk factors for suicide include: loss (financial, interpersonal, professional). Protective factors for this patient include: positive social support, responsibility to others (children, family), and religious beliefs against suicide. Considering these factors, the overall suicide risk at this point appears to be low. Patient is appropriate for outpatient follow up.  Per EDP note: "Patient is an 87 year old female with a history of MI status post PCI, CHF, COPD, HTN, HLD, atrial fibrillation, and suspected pancreatic cancer who presents to the emergency department with hypertension.  Patient states that she is asymptomatic at this time and not having any new pains.  Per EMS, 911 was called due to patient's elevated blood pressure.  They report that she has stopped taking her medications and reports that [name] came to her last night and told her to stop taking her medications.  She also told him she was ready to die.  She states that she lost her baby years ago on this date and is upset.  Unable to provide additional history.  Provider called her granddaughter April who says that the patient  called her and said that she wanted to come to her house to die.  Reports that her grandmother lives by herself.  Has a neighbor that checks on her daily.  Says that she is very religious and thinks that she has been depressed from her heart problems and cancer and chronic nausea and vomiting.  Is unaware of any recent illnesses.  Patient does not have any history of dementia."  Upon assessment, patient struggles to engage due to hearing difficulties.  With RN's assistance, patient was able to answer some  questions.  She was mostly disorganized and would begin to discuss her dissatisfaction with the sheriff's department.  She struggles to recall the reason for her presentation to the ED.  She does remember calling EMS "for help."  She is unable to clarify what type of help she needed.   Patient has no psychiatric history and she adamantly denies SI, stating she doesn't believe in that and wants to go home to be with the [name], however she states she would "never think of hurting myself."   Patient states she still lives alone, "well it's me and the [name]."  She denies any concerns and made several statements about continuing to take her medicine and wanting to go home.  She also denies HI.  No indication of AVH or history.  She does appear to be experiencing mild paranoia at points.  Shortly after starting the assessment, patient looked at clinician via Telepsych monitor and stated, "I don't know you.  I don't like the way you are speaking and I don't know what you want.  I don't like your eyes.  Don't look at me."  Within moments, she seemed to forget these statements and again began discussing random topics.  She gives verbal consent for clinician to speak with her daughter.  Clinician spoke with patient's daughter, [name], who shares concern for her mother's physical decline.  She was aware that patient was taken to the ED, however states the neighbor called when patient stated she needed help.  On EMS arrival, patient was weak and unable to ambulate without assist.  [name] states she lost her husband two  years ago and had asked patient at that time to come live with her in [place], [place].  Patient declined at that time, stating she wanted to stay in this area as she likes all of her doctors.  Patient is no longer able to drive or cook.  She has meals on wheels bringing meals daily.  Her granddaughter and a neighbor also check on her regularly and often bring her food and take her to appointments.   Patient has  recently been telling her daughter she needs to go into assisted living, due to having frequent falls.  She has mentioned being scared to live alone.  Daughter believes patient, at the least, would need home health support with meals, ambulating and with some ADLs. She does believe it would probably be best for patient to be considered for assisted living.  It seems patient prefers assisted living at this point.  No other mental health needs at this time.     Chief Complaint:  Chief Complaint  Patient presents with   Hypertension   Visit Diagnosis: Depressive Disorder Unspecified    CCA Screening, Triage and Referral (STR)  Patient Reported Information How did you hear about [place]? Other (Comment) (EMS)  What Is the Reason for Your Visit/Call Today? Patient presents via EMS with altered mental status.  She struggled to provide history on arrival to the ED.  She seemed suspicious of ED staff, believing they wanted to kill her.  Family report patient stopped taking her medications since she's ready to die.  She reportedly called her granddaughter asking to come die in her home.  Patient adamantly denies SI on assessment. She is somewhat disorganized, often discussing random concerns about laws and the sheriff's department.  How Long Has This Been Causing You Problems? 1 wk - 1 month  What Do You Feel Would Help You the Most Today? Social Support   Have You Recently Had Any Thoughts About Hurting Yourself? No  Are You Planning to Commit Suicide/Harm Yourself At This time? No  Flowsheet Row ED to Hosp-Admission (Current) from 10/27/2022 in [place] ED to Hosp-Admission (Discharged) from 06/29/2022 in [facility] Admission (Discharged) from 02/24/2022 in [facility] No Risk No Risk No Risk         Have you Recently Had Thoughts About [place]? No  Are You Planning to Harm Someone at  This Time? No  Explanation: N/A   Have You Used Any Alcohol or Drugs in the Past 24 Hours? No  What Did You Use and How Much? N/A   Do You Currently Have a Therapist/Psychiatrist? No  Name of Therapist/Psychiatrist: Name of Therapist/Psychiatrist: N/A   Have You Been Recently Discharged From Any Office Practice or Programs? No  Explanation of Discharge From Practice/Program: No data recorded    CCA Screening Triage Referral Assessment Type of Contact: Face-to-Face  Telemedicine Service Delivery:   Is this Initial or Reassessment?   Date Telepsych consult ordered in [facility]:    Time Telepsych consult ordered in [facility]:    Location of Assessment: [facility]  Provider Location: [facility] Services   Collateral Involvement: Daughter, [name], provided collateral.   Does Patient Have a [place]? No  Legal Guardian Contact Information: N/A  Copy of Legal Guardianship Form: -- (N/A)  Legal Guardian Notified of Arrival: -- (N/A)  Legal Guardian Notified of Pending Discharge: -- (N/A)  If Minor and  Not Living with Parent(s), Who has Custody? N/A  Is CPS involved or ever been involved? -- (UTA d/t patient's current mental status)  Is APS involved or ever been involved? -- (UTA d/t patient's current mental status)   Patient Determined To Be At Risk for Harm To Self or Others Based on Review of Patient Reported Information or Presenting Complaint? No  Method: -- (N/A, no HI)  Availability of Means: -- (N/A, no HI)  Intent: -- (N/A, no HI)  Notification Required: -- (N/A, no HI)  Additional Information for Danger to Others Potential: -- (N/A, no HI)  Additional Comments for Danger to Others Potential: N/A, no HI  Are There Guns or Other Weapons in [place]? No  Types of Guns/Weapons: N/A  Are These Weapons Safely Secured?                            -- (N/A)  Who Could Verify You Are Able To Have These Secured: N/A  Do You Have any  Outstanding Charges, Pending Court Dates, Parole/Probation? None  Contacted To Inform of Risk of Harm To Self or Others: -- (N/A)    Does Patient Present under Involuntary Commitment? No    [place] of Residence: [place]   Patient Currently Receiving the Following Services: Not Receiving Services   Determination of Need: Urgent (48 hours)   Options For Referral: ALF/SNF; Medication Management     CCA Biopsychosocial Patient Reported Schizophrenia/Schizoaffective Diagnosis in Past: No   Strengths: Patient has been living independently, she has family support   Mental Health Symptoms Depression:   Hopelessness   Duration of Depressive symptoms:  Duration of Depressive Symptoms: Greater than two weeks   Mania:   None   Anxiety:    Worrying; Tension   Psychosis:   Delusions   Duration of Psychotic symptoms:  Duration of Psychotic Symptoms: Less than six months   Trauma:   None   Obsessions:   None   Compulsions:   None   Inattention:   N/A   Hyperactivity/Impulsivity:   N/A   Oppositional/Defiant Behaviors:   N/A   Emotional Irregularity:  No data recorded  Other Mood/Personality Symptoms:   recent onset of depression, related to ongoing medical issues and now feeling weak with recent falls/mobility issues    Mental Status Exam Appearance and self-care  Stature:   Small   Weight:   Thin   Clothing:   Casual   Grooming:   Normal   Cosmetic use:   None   Posture/gait:   -- (UTA, lying in bed)   Motor activity:   -- (UTA, lying in bed)   Sensorium  Attention:   Confused; Normal   Concentration:   Focuses on irrelevancies; Variable; Normal   Orientation:   Person; Place; Object   Recall/memory:  No data recorded  Affect and Mood  Affect:   Flat   Mood:   Depressed   Relating  Eye contact:   Normal   Facial expression:   Constricted   Attitude toward examiner:   Guarded; Suspicious; Cooperative    Thought and Language  Speech flow:  Soft   Thought content:   Delusions (paranoia at points, no current ideation)   Preoccupation:   None   Hallucinations:   None   Organization:   Loose; Perseverations; [place] of Knowledge:   Impoverished by (Comment) (declining cognitive functioning)   Intelligence:   Average  Abstraction:   Functional   Judgement:   Impaired   Reality Testing:   Distorted   Insight:   Flashes of insight; Lacking   Decision Making:   Confused; Vacilates   Social Functioning  Social Maturity:   Responsible   Social Judgement:   Normal   Stress  Stressors:   Illness; Grief/losses   Coping Ability:   Programme researcher, broadcasting/film/video Deficits:   Activities of daily living; Communication; Environmental health practitioner; Self-care   Supports:   Family; Friends/Service system     Religion: Religion/Spirituality Are You A Religious Person?: Yes What is Your Religious Affiliation?:  (NA) How Might This Affect Treatment?: NA  Leisure/Recreation: Leisure / Recreation Do You Have Hobbies?: No  Exercise/Diet: Exercise/Diet Do You Exercise?: No Have You Gained or Lost A Significant Amount of Weight in the Past Six Months?: No Do You Follow a Special Diet?: No Do You Have Any Trouble Sleeping?:  (UTA, d/t patient's altered mental status)   CCA Employment/Education Employment/Work Situation: Employment / Work Situation Employment Situation:  (UTA, d/c patient's AMS) Patient's Job has Been Impacted by Current Illness:  (N/A) Has Patient ever Been in the [employer]?: No  Education: Education Is Patient Currently Attending School?: No Last Grade Completed: 12 Did You Attend College?:  (UTA, d/c patient's AMS) Did You Have An Individualized Education Program (IIEP):  (UTA, d/c patient's AMS) Did You Have Any Difficulty At School?:  (UTA, d/c patient's AMS) Patient's Education Has Been Impacted by Current Illness:  (UTA,  d/c patient's AMS)   CCA Family/Childhood History Family and Relationship History: Family history Marital status: Widowed Widowed, when?: NA Does patient have children?: Yes How many children?:  (UTA, d/c patient's AMS) How is patient's relationship with their children?: UTA, d/c patient's AMS  Childhood History:  Childhood History By whom was/is the patient raised?: Mother, Father Did patient suffer any verbal/emotional/physical/sexual abuse as a child?:  (UTA, d/c patient's AMS) Did patient suffer from severe childhood neglect?:  (UTA, d/c patient's AMS) Has patient ever been sexually abused/assaulted/raped as an adolescent or adult?:  (UTA, d/c patient's AMS) Was the patient ever a victim of a crime or a disaster?:  (UTA, d/c patient's AMS) Witnessed domestic violence?:  (UTA, d/c patient's AMS) Has patient been affected by domestic violence as an adult?:  (UTA, d/c patient's AMS)       CCA Substance Use Alcohol/Drug Use: Alcohol / Drug Use Pain Medications: See MAR Prescriptions: See MAR Over the Counter: See MAR History of alcohol / drug use?: No history of alcohol / drug abuse                         ASAM's:  Six Dimensions of Multidimensional Assessment  Dimension 1:  Acute Intoxication and/or Withdrawal Potential:      Dimension 2:  Biomedical Conditions and Complications:      Dimension 3:  Emotional, Behavioral, or Cognitive Conditions and Complications:     Dimension 4:  Readiness to Change:     Dimension 5:  Relapse, Continued use, or Continued Problem Potential:     Dimension 6:  Recovery/Living Environment:     ASAM Severity Score:    ASAM Recommended Level of Treatment:     Substance use Disorder (SUD)    Recommendations for Services/Supports/Treatments:    Discharge Disposition:    DSM5 Diagnoses: Patient Active Problem List   Diagnosis Date Noted   Paranoid delusion ([place]) 10/28/2022   Current moderate episode of  major depressive  disorder ([place]) 10/28/2022   CAP (community acquired pneumonia) 10/27/2022   Generalized abdominal pain 09/29/2022   Nausea without vomiting 09/29/2022   COPD (chronic obstructive pulmonary disease) ([place]) 06/29/2022   Chronic respiratory failure with hypoxia ([place]) 06/29/2022   Acute on chronic diastolic CHF (congestive heart failure) ([place]) 06/29/2022   IPMN (intraductal papillary mucinous neoplasm) 06/09/2022   Constipation 06/09/2022   Upper abdominal pain 12/03/2021   CAD (coronary artery disease) 05/28/2021   Chest pain 03/30/2021   COVID-19 virus infection 03/30/2021   Non-STEMI (non-ST elevated myocardial infarction) ([place])    Hypertensive urgency    Pancreatic lesion 03/20/2021   Hypertensive crisis 03/19/2021   Elevated troponin 03/19/2021   Prolonged QT interval 03/19/2021   Esophageal dysphagia 07/03/2020   Nonspecific chest pain 09/15/2016   Complete rotator cuff tear of left shoulder    Anemia 10/31/2014   H/O heart artery stent 10/30/2014   Chest pain with moderate risk for cardiac etiology [id number]   Diastolic dysfunction with chronic heart failure ([place]) 08/23/2014   Chronic diastolic heart failure ([place]) 08/23/2014   Acute chest pain 08/23/2014   Acquired obstruction of both nasolacrimal ducts 06/15/2014   Lower abdominal pain 01/27/2012   Hypokalemia 06/03/2011   PERICARDIAL EFFUSION 07/10/2009   Disease of pericardium 07/10/2009   Essential hypertension, benign 04/18/2009   Coronary artery disease involving native heart with unstable angina pectoris ([place]) 04/18/2009   Benign essential hypertension 04/18/2009   PPM-Medtronic 04/04/2009   BRADYCARDIA-TACHYCARDIA SYNDROME 01/26/2009   HLD (hyperlipidemia) 12/19/2008   Anxiety 12/19/2008   Atrial fibrillation ([place]) 12/19/2008   GERD (gastroesophageal reflux disease) 12/19/2008   Abnormal involuntary movements 12/19/2008   Afib ([place]) 12/19/2008   Familial multiple lipoprotein-type hyperlipidemia 12/19/2008      Referrals to Alternative Service(s): Referred to Alternative Service(s):   Place:   Date:   Time:    Referred to Alternative Service(s):   Place:   Date:   Time:    Referred to Alternative Service(s):   Place:   Date:   Time:    Referred to Alternative Service(s):   Place:   Date:   Time:     Fransico Meadow, [facility]

## 2022-10-28 NOTE — ED Notes (Signed)
Pt refused Temp. Started to become combative

## 2022-10-28 NOTE — Progress Notes (Signed)
Pt trying to climb out of bed and is worried that people are out to get her. Floor mats placed and bed rearranged closer to wall so that she will not climb out on other side. Pt repositioned and is comfortable at this time. Bed alarm on call light within reach.

## 2022-10-28 NOTE — ED Notes (Signed)
Pt continued to refuse temp recheck prior to transport.    All belongings transported w/ Pt.

## 2022-10-28 NOTE — TOC Progression Note (Signed)
  Transition of Care [facility]) Screening Note   Patient Details  Name: [name] Date of Birth: 1936/07/25   Transition of Care [facility]) CM/SW Contact:    [name], LCSW Phone Number: 10/28/2022, 10:32 AM    Anticipating pt will have dc planning needs. Awaiting further workup from MD, Behavioral Medicine, and Palliative and then [facility] will follow up.  Transition of Care Department [facility]) has reviewed patient and no TOC needs have been identified at this time. We will continue to monitor patient advancement through interdisciplinary progression rounds. If new patient transition needs arise, please place a TOC consult.

## 2022-10-28 NOTE — Progress Notes (Signed)
Pt refuses for temperature to be taken. Says she doesn't have a fever and has told us this already.

## 2022-10-28 NOTE — Consult Note (Signed)
Consultation Note Date: 10/28/2022   Patient Name: [name]  DOB: 1935/09/25  MRN: [id number]  Age / Sex: 87 y.o., female  PCP: Celene Squibb, MD Referring Physician: Geradine Girt, DO  Reason for Consultation: Establishing goals of care  HPI/Patient Profile: 87 y.o. female  with past medical history of respiratory failure on oxygen at 2 L nightly, COPD, HTN/HLD, heart failure, suspected pancreatic cancer, tachybradycardia syndrome with a pacer, CAD, anxiety, chronic back pain, tremors, hiatal hernia/GERD admitted on 10/27/2022 with community-acquired pneumonia, paranoid delusions resolved hypertensive urgency.   Clinical Assessment and Goals of Care: I have reviewed medical records including EPIC notes, labs and imaging, received report from RN, assessed the patient.  [name] is resting quietly in bed.  She appears chronically ill and frail, pale.  She greets me, making and mostly keeping eye contact.  She is alert and oriented x 3, but does have times of confusion.  She has rather severe hearing loss.  She can clearly make her needs known.  There is no family at bedside at this time.  [name] is well-known to me after many years of caring for her daughter.  We talk about [name]'s acute and chronic health concerns.  She tells me that she has been living alone, able to do her own bathing and dressing.  She tells me that she prefers to return to her own home.  She shares that she has help from family and friends when needed.  We talk about her acute illness and the treatment plan including, but not limited to, pneumonia, high blood pressure, her desire to take medications. We talk about CODE STATUS.  [name] endorses DNR.   We talk about hospice care.  [name] daughter, [name], had hospice care.  She is agreeable for at home hospice care.  Provider choice offered, [facility]  chosen.  Call to daughter/healthcare surrogate, [name], to discuss diagnosis prognosis, [place], EOL wishes, disposition and options.  I introduced Palliative Medicine as specialized medical care for people living with serious illness. It focuses on providing relief from the symptoms and stress of a serious illness. The goal is to improve quality of life for both the patient and the family.  We discussed a brief life review of the patient.  [name] worked for many years in [employer], which led to hearing loss.  She had 3 children, 1 is deceased, her son is not active in her life.  She has been living independently, even driving some.  She has neighbors and a granddaughter who check on her daily and assist with bringing food and getting groceries.  [name] talks about her faith.  [name] shares that she is concerned that [name] is unable to live on her own any longer.  We then focused on their current illness.  We talked about community acquired pneumonia and the treatment plan.  When asked about what is going on with the GI doctor, [name] states that she was told that Mrs.  Bushey had "cysts" on her pancreas.  I encouraged her to discuss this further with her mother.  We talk about finding a balance in caring for [name], she is declining to take medications while in the hospital, I am not sure how much investigation she wants to do with her GI/pancreatic issue.  [name] states agreement that her mother may not want to go back and forth to doctors offices. We talk about CODE STATUS, [name] endorses DNR.  We talk about hospice care, [name] is in agreement for at home "treat the treatable" hospice care.  The natural disease trajectory and expectations at EOL were discussed.  Advanced directives, concepts specific to code status, artifical feeding and hydration, and rehospitalization were considered and discussed.  Both [name] and her healthcare surrogate endorsed DNR.  Orders  adjusted.  Hospice and Palliative Care services outpatient were explained and offered.  [name] is familiar with hospice care through her daughter [name].  She is agreeable to "treat the treatable" hospice care.  Discussed the importance of continued conversation with family and the medical providers regarding overall plan of care and treatment options, ensuring decisions are within the context of the patient's values and GOCs.  Questions and concerns were addressed.  The patient and family were encouraged to call with questions or concerns.  PMT will continue to support holistically.  Conference with attending, bedside nursing staff, transition of care team related to patient condition, needs, goals of care, disposition.   HCPOA NEXT OF KIN -daughter, [name]    SUMMARY OF RECOMMENDATIONS   At this point continue to treat the treatable but no CPR or intubation Time for outcomes. Prefers to return home but may need short-term rehab At home "treat the treatable" hospice care with [place]   Code Status/Advance Care Planning: DNR -patient states that she would not want attempted life support, "when [name] calls let me go".  Her daughter/healthcare surrogate, [name], is in agreement.  Symptom Management:  Per hospitalist, no additional needs at this time.  Palliative Prophylaxis:  Frequent Pain Assessment and Oral Care  Additional Recommendations (Limitations, Scope, Preferences): Continue to treat the treatable but no CPR or intubation.  Psycho-social/Spiritual:  Desire for further Chaplaincy support:no Additional Recommendations: Caregiving  Support/Resources and Education on Hospice  Prognosis:  < 6 months, would not be surprising based on likely pancreatic cancer, advanced age, declining functional status.  Discharge Planning: To be determined, based on outcomes.  Would prefer to return home, may need short-term rehab.      Primary  Diagnoses: Present on Admission:  CAP (community acquired pneumonia)  Hypokalemia  Prolonged QT interval  Hypertensive urgency  Atrial fibrillation (HCC)  CAD (coronary artery disease)  BRADYCARDIA-TACHYCARDIA SYNDROME  Chronic respiratory failure with hypoxia (HCC)  COPD (chronic obstructive pulmonary disease) ([place])  Essential hypertension, benign  Chronic diastolic heart failure ([place])   I have reviewed the medical record, interviewed the patient and family, and examined the patient. The following aspects are pertinent.  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    Essential hypertension    GERD (gastroesophageal reflux disease)    H/O hiatal hernia    Headache(784.0)  HOH (hard of hearing)    Hyperlipidemia    Neuromuscular disorder (HCC)    Tremors   Pericardial effusion    Hemorrhagic    Presence of permanent cardiac pacemaker    Tachycardia-bradycardia syndrome (HCC)    s/p Medtronic Adapta L dual chamber device  5/10   Social History   Socioeconomic History   Marital status: Widowed    Spouse name: Not on file   Number of children: 1   Years of education: Not on file   Highest education level: 8th grade  Occupational History   Occupation: Retired    Fish farm manager: RETIRED  Tobacco Use   Smoking status: Former    Packs/day: 2.00    Years: 10.00    Additional pack years: 0.00    Total pack years: 20.00    Types: Cigarettes    Quit date: 05/23/1989    Years since quitting: 33.4    Passive exposure: Past   Smokeless tobacco: Never  Vaping Use   Vaping Use: Never used  Substance and Sexual Activity   Alcohol use: No    Alcohol/week: 0.0 standard drinks of alcohol   Drug use: No   Sexual activity: Never   Other Topics Concern   Not on file  Social History Narrative   She lives alone, has adopted daughter.   Social Determinants of Health   Financial Resource Strain: Not on file  Food Insecurity: No Food Insecurity (06/30/2022)   Hunger Vital Sign    Worried About Running Out of Food in the Last Year: Never true    Ran Out of Food in the Last Year: Never true  Transportation Needs: No Transportation Needs (06/30/2022)   PRAPARE - Hydrologist (Medical): No    Lack of Transportation (Non-Medical): No  Physical Activity: Not on file  Stress: Not on file  Social Connections: Not on file   Family History  Problem Relation Age of Onset   Cancer Mother        Colon    Coronary artery disease Sister    Coronary artery disease Brother    Arthritis Other    Lung disease Other    Asthma Other    Scheduled Meds:  amLODipine  5 mg Oral Daily   aspirin EC  81 mg Oral Q breakfast   dextromethorphan-guaiFENesin  1 tablet Oral BID   doxycycline  100 mg Oral Q12H   enoxaparin (LOVENOX) injection  40 mg Subcutaneous Q24H   isosorbide mononitrate  30 mg Oral Daily   metoprolol succinate  50 mg Oral BID   pantoprazole  40 mg Oral Daily   ranolazine  500 mg Oral BID   rosuvastatin  40 mg Oral Daily   ticagrelor  60 mg Oral BID   Continuous Infusions: PRN Meds:.acetaminophen **OR** acetaminophen, albuterol, ALPRAZolam, hydrALAZINE, prochlorperazine Medications Prior to Admission:  Prior to Admission medications   Medication Sig Start Date End Date Taking? Authorizing Provider  albuterol (PROVENTIL) (2.5 MG/3ML) 0.083% nebulizer solution Take 3 mLs (2.5 mg total) by nebulization every 4 (four) hours as needed for wheezing or shortness of breath. 07/02/22 07/02/23  Roxan Hockey, MD  ALPRAZolam Duanne Moron) 1 MG tablet Take 1 mg by mouth 3 (three) times daily as needed for anxiety. 04/04/21   [provider]  amiodarone (PACERONE) 200 MG tablet TAKE 1 TABLET  ONCE DAILY ON MONDAY THROUGH SATURDAY, NONE ON SUNDAY. 08/07/22   Evans Lance, MD  amLODipine (NORVASC) 5 MG tablet Take  1 tablet (5 mg total) by mouth daily. 07/02/22   Roxan Hockey, MD  aspirin EC 81 MG tablet Take 1 tablet (81 mg total) by mouth daily with breakfast. Swallow whole. 07/02/22   Roxan Hockey, MD  Cyanocobalamin (VITAMIN B-12) 5000 MCG SUBL Take 5,000 mcg by mouth daily.    [provider]  dicyclomine (BENTYL) 10 MG capsule Take 1 capsule (10 mg total) by mouth 2 (two) times daily as needed for spasms. 09/25/22   Carlan, Chelsea L, NP  furosemide (LASIX) 40 MG tablet Take 1 tablet (40 mg total) by mouth daily as needed for fluid or edema. 03/22/21   Florencia Reasons, MD  isosorbide mononitrate (IMDUR) 30 MG 24 hr tablet Take 1 tablet (30 mg total) by mouth daily. 07/02/22   Roxan Hockey, MD  JARDIANCE 25 MG TABS tablet Take 25 mg by mouth daily.    [provider]  metoprolol succinate (TOPROL-XL) 50 MG 24 hr tablet Take 1 tablet (50 mg total) by mouth 2 (two) times daily. Take with or immediately following a meal. 07/02/22   Emokpae, Courage, MD  Multiple Vitamin (MULTIVITAMIN WITH MINERALS) TABS tablet Take 1 tablet by mouth daily.     [provider]  nitroGLYCERIN (NITROSTAT) 0.4 MG SL tablet Place 1 tablet (0.4 mg total) under the tongue every 5 (five) minutes x 3 doses as needed for chest pain. 03/22/21   Florencia Reasons, MD  OXYGEN Inhale 2 L into the lungs as needed (PRN as needed at night).    [provider]  pantoprazole (PROTONIX) 40 MG tablet TAKE (1) TABLET BY MOUTH ONCE DAILY. 03/17/22   Satira Sark, MD  potassium chloride (KLOR-CON) 10 MEQ tablet Take 1 tablet (10 mEq total) by mouth daily. Take While taking Lasix/furosemide 07/02/22   Roxan Hockey, MD  primidone (MYSOLINE) 50 MG tablet Take 50 mg by mouth at bedtime. 10/31/10   [provider]  PROAIR HFA 108 (90 Base) MCG/ACT inhaler Inhale 1 puff into the lungs every  4 (four) hours as needed for wheezing or shortness of breath.  11/10/18   [provider]  ranolazine (RANEXA) 500 MG 12 hr tablet Take 1 tablet (500 mg total) by mouth 2 (two) times daily. 07/02/22   Roxan Hockey, MD  rosuvastatin (CRESTOR) 40 MG tablet Take 1 tablet (40 mg total) by mouth daily. Patient not taking: Reported on 09/25/2022 07/02/22   Roxan Hockey, MD  senna-docusate (SENOKOT-S) 8.6-50 MG tablet Take 1 tablet by mouth at bedtime. Patient taking differently: Take 1-2 tablets by mouth daily as needed (constipation.). 03/22/21   Florencia Reasons, MD  ticagrelor (BRILINTA) 60 MG TABS tablet Take 1 tablet (60 mg total) by mouth 2 (two) times daily. 07/02/22   Roxan Hockey, MD  vitamin E 180 MG (400 UNITS) capsule Take 400 Units by mouth daily.    [provider]   Allergies  Allergen Reactions   Amitriptyline Hcl Other (See Comments)    Caused "jaws to twist and lock"   Plavix [Clopidogrel Bisulfate] Hives   Sulfonamide Derivatives Other (See Comments)    UNKNOWN REACTION   Review of Systems  Unable to perform ROS: Age    Physical Exam Vitals and nursing note reviewed.  Constitutional:      General: She is not in acute distress.    Appearance: She is ill-appearing.  HENT:     Mouth/Throat:     Mouth: Mucous membranes are moist.  Cardiovascular:     Rate and  Rhythm: Normal rate.  Pulmonary:     Effort: Pulmonary effort is normal. No respiratory distress.  Skin:    General: Skin is warm and dry.  Neurological:     General: No focal deficit present.     Mental Status: She is alert and oriented to person, place, and time.     Comments: Oriented x 3, but with some confusion     Vital Signs: BP (!) 154/87   Pulse 97   Temp (!) 97.5 F (36.4 C) (Oral)   Resp 19   Ht 5\' 5"  (1.651 m)   Wt 62.1 kg   SpO2 99%   BMI 22.80 kg/m  Pain Scale: 0-10   Pain Score: 0-No pain   SpO2: SpO2: 99 % O2 Device:SpO2: 99 % O2 Flow Rate: .   IO:  Intake/output summary: No intake or output data in the 24 hours ending 10/28/22 1200  LBM:   Baseline Weight: Weight: 62.1 kg Most recent weight: Weight: 62.1 kg     Palliative Assessment/Data:     Time In: 0820 Time Out: 0935 Time Total: 75 minutes  Greater than 50%  of this time was spent counseling and coordinating care related to the above assessment and plan.  Signed by: Drue Novel, NP   Please contact Palliative Medicine Team phone at [phone] for questions and concerns.  For individual provider: See Shea Evans

## 2022-10-28 NOTE — ED Notes (Signed)
Pt continues to refuse to have temperature checked and all other medications.  Pt reports she just wants to go home.

## 2022-10-28 NOTE — Progress Notes (Signed)
TTS cart at bedside. 

## 2022-10-28 NOTE — ED Notes (Signed)
Pt is alert and oriented at this time, able to answer all questions appropriately. Pt requesting not to receive any more medications. States "it is my right to refuse and I do not want y'all to give me anything against my will"  Dr. Josephine Cables made aware.

## 2022-10-28 NOTE — BH Assessment (Signed)
@  10:30 - Clinician attempting to connect to [employer] cart to complete [facility] Assessment.  No answer for call.

## 2022-10-29 DIAGNOSIS — Z7189 Other specified counseling: Secondary | ICD-10-CM | POA: Diagnosis not present

## 2022-10-29 DIAGNOSIS — F22 Delusional disorders: Secondary | ICD-10-CM | POA: Diagnosis not present

## 2022-10-29 DIAGNOSIS — Z515 Encounter for palliative care: Secondary | ICD-10-CM | POA: Diagnosis not present

## 2022-10-29 DIAGNOSIS — J189 Pneumonia, unspecified organism: Secondary | ICD-10-CM | POA: Diagnosis not present

## 2022-10-29 LAB — BASIC METABOLIC PANEL
Anion gap: 7 (ref 5–15)
BUN: 17 mg/dL (ref 8–23)
CO2: 24 mmol/L (ref 22–32)
Calcium: 9.1 mg/dL (ref 8.9–10.3)
Chloride: 106 mmol/L (ref 98–111)
Creatinine, Ser: 0.68 mg/dL (ref 0.44–1.00)
GFR, Estimated: >60 mL/min (ref >60)
Glucose, Bld: 89 mg/dL (ref 70–99)
Potassium: 3.8 mmol/L (ref 3.5–5.1)
Sodium: 137 mmol/L (ref 135–145)

## 2022-10-29 LAB — CBC
HCT: 37.2 % (ref 36.0–46.0)
Hemoglobin: 12 g/dL (ref 12.0–15.0)
MCH: 30.5 pg (ref 26.0–34.0)
MCHC: 32.3 g/dL (ref 30.0–36.0)
MCV: 94.7 fL (ref 80.0–100.0)
Platelets: 168 10*3/uL (ref 150–400)
RBC: 3.93 MIL/uL (ref 3.87–5.11)
RDW: 15.1 % (ref 11.5–15.5)
WBC: 7.6 10*3/uL (ref 4.0–10.5)
nRBC: 0 % (ref 0.0–0.2)

## 2022-10-29 MED ORDER — MELATONIN 3 MG PO TABS
6.0000 mg | ORAL_TABLET | Freq: Once | ORAL | Status: AC
  Administered 2022-10-29: 6 mg via ORAL
  Filled 2022-10-29: qty 2

## 2022-10-29 MED ORDER — POTASSIUM CHLORIDE CRYS ER 20 MEQ PO TBCR
40.0000 meq | EXTENDED_RELEASE_TABLET | Freq: Once | ORAL | Status: AC
  Administered 2022-10-29: 40 meq via ORAL
  Filled 2022-10-29: qty 2

## 2022-10-29 MED ORDER — OLANZAPINE 5 MG PO TABS
5.0000 mg | ORAL_TABLET | Freq: Every day | ORAL | Status: DC
  Administered 2022-10-29: 5 mg via ORAL
  Filled 2022-10-29: qty 1

## 2022-10-29 MED ORDER — OXYCODONE HCL 5 MG PO TABS: 5.0000 mg | ORAL_TABLET | Freq: Four times a day (QID) | ORAL | Status: DC | PRN

## 2022-10-29 NOTE — Progress Notes (Signed)
PROGRESS NOTE    [name]  [id number] DOB: 05-14-1936 DOA: 10/27/2022 PCP: Celene Squibb, MD    Brief Narrative:  [name] is a 87 y.o. female with medical history significant of chronic respiratory failure on supplemental oxygen at 2 LPM at night, COPD, atrial fibrillation, CHF, suspected pancreatic cancer, tachycardia-bradycardia syndrome s/p pacemaker, CAD who presented to the emergency department.  Patient was unable to provide a history due to altered mental status, she was suspicious of staff wanting to kill her.  History was obtained from ED physician and ED medical record.  Per report, family noted that patient's BP was elevated, apparently she stopped taking her medications because she states that she saw her usual symptoms last night and told to stop taking all her meds and she told EMS team that she was ready to die.  Patient states that she lost her baby years ago on this date and was upset.  ED physician states that if called patient's granddaughter who told him that patient called and said that she wanted to come to die in her house. Patient lives alone and a neighbor checks on her daily.  Granddaughter states that patient was very religious and thinks that she has been depressed due to cancer, heart problems, nausea and vomiting.  There was no reported history of dementia.   Assessment and Plan:  Community-acquired pneumonia Patient was started on ceftriaxone and doxycycline.  Now just on oral doxycycline.  Respiratory status noted to be stable. She is noted to be on 2 L of oxygen by nasal cannula.  Apparently uses oxygen at home. -Continue Mucinex, incentive spirometry, flutter valve    Paranoid delusional Patient was reported to stop taking all her medications because she states that she is also asked to stop taking her meds She also stated that she wants to die Seen by behavioral health.  No indication for behavioral health hospitalization. Add low-dose Zyprexa at  bedtime.  Hypokalemia -Supplemented.  Will give additional dose today.   Prolonged QT interval QTc 507 Likely due to hypokalemia.  Will supplement potassium and recheck EKG tomorrow.   Hypertensive urgency-resolved Continue Toprol-XL Imdur, amlodipine Continue IV hydralazine 10 mg every 6 hours as needed for SBP > 170   Chronic atrial fibrillation s/p pacemaker implantation Continue aspirin, Toprol-XL Amiodarone currently held due to QT prolongation Not on anticoagulation.   Suspected pancreatic cancer -last GI note: mixed type IPMN. We discussed the possibility that this lesion may become a cancer in the future but also we discussed that she had multiple comorbidities and has advanced age which make her high risk surgical candidate for a high risk surgery. We discussed the benefits and possible risk of proceeding with a Whipple surgery if she were to have worsening of her pancreatic lesions. For now, we will continue the current recommendation for surveillance as discussed in the multidisciplinary tumor board.  Plan is to repeat a CTA pancreatic study in the near future. CA 19-9 was noted to be normal at 14 back in February 2024.  Chronic diastolic CHF Echo from 0000000 with EF of 55 to 60%, no regional wall motion abnormalities Continue home meds   Tachycardia-bradycardia syndrome Patient has a permanent pacemaker and follows with Dr. Lovena Le per medical record   GERD Continue Protonix   CAD s/p stent placement Continue aspirin, Imdur, statin, Brilinta, Ranexa   Chronic respiratory failure with hypoxia Continue home oxygen   COPD Continue Ventolin   Anxiety -on xanax at home-- will  continue  Tremor -has had long-time  Goals of care Palliative medicine was consulted.  Noted to be DNR.    DVT prophylaxis: enoxaparin (LOVENOX) injection 40 mg Start: 10/28/22 1000 SCDs Start: 10/28/22 0417    Code Status: DNR Family Communication: No family at bed  side  Disposition Plan: PT and OT evaluation    Consultants:  Psych Palliative care   Subjective: Patient denies any chest pain.  Complains of abdominal discomfort at times.  No nausea or vomiting.  Wants to use the bathroom.  Objective: Vitals:   10/29/22 0147 10/29/22 0152 10/29/22 0407 10/29/22 1000  BP: (!) 160/91 (!) 156/80 112/61   Pulse: 76 76 69   Resp: (!) 30 (!) 30 (!) 30 (!) 31  Temp: 98 F (36.7 C) 98 F (36.7 C) 97.9 F (36.6 C)   TempSrc: Oral  Oral   SpO2: 99%  98%   Weight:      Height:        Intake/Output Summary (Last 24 hours) at 10/29/2022 1200 Last data filed at 10/29/2022 0500 Gross per 24 hour  Intake 580 ml  Output 250 ml  Net 330 ml   Filed Weights   10/27/22 1445  Weight: 62.1 kg    Examination:  General appearance: Awake alert.  In no distress.  Somewhat distracted Resp: Clear to auscultation bilaterally.  Normal effort Cardio: S1-S2 is normal regular.  No S3-S4.  No rubs murmurs or bruit GI: Abdomen is soft.  Tender diffusely without any rebound rigidity or guarding.  No obvious masses organomegaly appreciated. Extremities: No edema.   Neurologic:  No focal neurological deficits.     Data Reviewed: I have personally reviewed following labs and imaging studies  CBC: Recent Labs  Lab 10/27/22 2106 10/28/22 0501 10/29/22 0358  WBC 11.8* 9.3 7.6  HGB 14.3 14.3 12.0  HCT 43.5 44.0 37.2  MCV 91.0 92.6 94.7  PLT 204 199 [id number]    Basic Metabolic Panel: Recent Labs  Lab 10/27/22 2033 10/28/22 0501 10/29/22 0358  NA 139 141 137  K 3.4* 3.2* 3.8  CL 104 106 106  CO2 22 24 24   GLUCOSE 133* 105* 89  BUN 10 9 17   CREATININE 0.68 0.64 0.68  CALCIUM 9.8 9.3 9.1  MG 2.2 2.2  --   PHOS  --  2.8  --     GFR: Estimated Creatinine Clearance: 46.3 mL/min (by C-G formula based on SCr of 0.68 mg/dL). Liver Function Tests: Recent Labs  Lab 10/27/22 2033 10/28/22 0501  AST 35 30  ALT 22 20  ALKPHOS 97 88  BILITOT 1.1 1.0   PROT 8.0 7.3  ALBUMIN 4.2 3.8    Recent Labs  Lab 10/27/22 2033  LIPASE 33     Sepsis Labs: Recent Labs  Lab 10/28/22 0600  PROCALCITON <0.10     Recent Results (from the past 240 hour(s))  Blood culture (routine x 2)     Status: None (Preliminary result)   Collection Time: 10/27/22 11:53 PM   Specimen: BLOOD  Result Value Ref Range Status   Specimen Description BLOOD BLOOD RIGHT ARM  Final   Special Requests   Final    BOTTLES DRAWN AEROBIC AND ANAEROBIC Blood Culture adequate volume   Culture   Final    NO GROWTH < 12 HOURS Performed at [facility], [address], [place], [place] [address]    Report Status PENDING  Incomplete  Blood culture (routine x 2)     Status:  None (Preliminary result)   Collection Time: 10/27/22 11:53 PM   Specimen: BLOOD  Result Value Ref Range Status   Specimen Description BLOOD BLOOD RIGHT HAND  Final   Special Requests   Final    BOTTLES DRAWN AEROBIC AND ANAEROBIC Blood Culture results may not be optimal due to an excessive volume of blood received in culture bottles   Culture   Final    NO GROWTH < 12 HOURS Performed at [facility], [address] [place], [place] [address]    Report Status PENDING  Incomplete         Radiology Studies: DG Chest 2 View  Result Date: 10/27/2022 CLINICAL DATA:  Altered mental status EXAM: CHEST - 2 VIEW COMPARISON:  06/29/2022 FINDINGS: Left-sided pacing device as before. Cardiomegaly. Heterogeneous ground-glass opacities in the right upper lobe and left mid to lower lung. Aortic atherosclerosis. No pneumothorax. IMPRESSION: Heterogeneous ground-glass opacities in the right upper lobe and left mid to lower lung, suspect for multifocal pneumonia. Cardiomegaly. Electronically Signed   By: Donavan Foil M.D.   On: 10/27/2022 21:58   CT Head Wo Contrast  Result Date: 10/27/2022 CLINICAL DATA:  Altered mental status EXAM: CT HEAD WITHOUT CONTRAST TECHNIQUE: Contiguous axial images were obtained  from the base of the skull through the vertex without intravenous contrast. RADIATION DOSE REDUCTION: This exam was performed according to the departmental dose-optimization program which includes automated exposure control, adjustment of the mA and/or kV according to patient size and/or use of iterative reconstruction technique. COMPARISON:  CT brain 12/11/2021 FINDINGS: Brain: No acute territorial infarction, hemorrhage or intracranial mass. Probable chronic lacunar infarct left basal ganglia. Patchy white matter hypodensity consistent with chronic small vessel ischemic change. Mild atrophy. Nonenlarged ventricles Vascular: No hyperdense vessels.  Carotid vascular calcification Skull: Normal. Negative for fracture or focal lesion. Sinuses/Orbits: No acute finding. Other: None IMPRESSION: 1. No CT evidence for acute intracranial abnormality. 2. Atrophy and chronic small vessel ischemic changes of the white matter. Electronically Signed   By: Donavan Foil M.D.   On: 10/27/2022 21:32        Scheduled Meds:  amLODipine  5 mg Oral Daily   aspirin EC  81 mg Oral Q breakfast   dextromethorphan-guaiFENesin  1 tablet Oral BID   doxycycline  100 mg Oral Q12H   enoxaparin (LOVENOX) injection  40 mg Subcutaneous Q24H   isosorbide mononitrate  30 mg Oral Daily   metoprolol succinate  50 mg Oral BID   pantoprazole  40 mg Oral Daily   ranolazine  500 mg Oral BID   rosuvastatin  40 mg Oral Daily   ticagrelor  60 mg Oral BID   Continuous Infusions:     LOS: 2 days      Bonnielee Haff,  [facility]   10/29/2022, 12:00 PM

## 2022-10-29 NOTE — Evaluation (Signed)
Physical Therapy Evaluation Patient Details Name: [name] MRN: [id number] DOB: Dec 27, 1935 Today's Date: 10/29/2022  History of Present Illness  87 y.o. female with medical history significant of chronic respiratory failure on supplemental oxygen at 2 LPM at night, COPD, atrial fibrillation, CHF, suspected pancreatic cancer, tachycardia-bradycardia syndrome s/p pacemaker, CAD who presented to the emergency department.  Patient was unable to provide a history due to altered mental status, she was suspicious of staff wanting to kill her.  History was obtained from ED physician and ED medical record.  Per report, family noted that patient's BP was elevated, apparently she stopped taking her medications because she states that she saw her usual symptoms last night and told to stop taking all her meds and she told EMS team that she was ready to die.  Patient states that she lost her baby years ago on this date and was upset.  ED physician states that if called patient's granddaughter who told him that patient called and said that she wanted to come to die in her house.  Clinical Impression   Pt is a 87yo female presenting to [facility] due to reason stated in the HPI.   Pt tolerated today's Physical Therapy Evaluation, demonstrating slow, labored, fatigued movements with increase time required. Pt performed modified independent w/ bed mobility, min guard w/ HHA w/ functional transfers, and min guard w/ HHA for ~43ft for gait/ambulation.   Based upon objective information, pt's functional deficits and impairments included reduced functional mobility, gait, transfers and ADLs in setting of generalized weakness, balance deficits, and reduced activity tolerance Based upon these deficits/impairments, pt would benefit from skilled acute physical therapy services to address the above deficits and improve their functional status.  PT recommends pt discharge to short term skilled rehab  facility prior to DC home in order to improve pt's functional status, safety and independence with functional mobility and overall QOL.            Recommendations for follow up therapy are one component of a multi-disciplinary discharge planning process, led by the attending physician.  Recommendations may be updated based on patient status, additional functional criteria and insurance authorization.  Follow Up Recommendations Can patient physically be transported by private vehicle: Yes     Assistance Recommended at Discharge Intermittent Supervision/Assistance  Patient can return home with the following  A little help with walking and/or transfers;A little help with bathing/dressing/bathroom    Equipment Recommendations Rolling walker (2 wheels);BSC/3in1  Recommendations for Other Services  Rehab consult    Functional Status Assessment Patient has had a recent decline in their functional status and demonstrates the ability to make significant improvements in function in a reasonable and predictable amount of time.     Precautions / Restrictions Restrictions Weight Bearing Restrictions: No      Mobility  Bed Mobility Overal bed mobility: Needs Assistance Bed Mobility: Supine to Sit, Sit to Supine     Supine to sit: Modified independent (Device/Increase time) Sit to supine: Modified independent (Device/Increase time)   General bed mobility comments: slow movements with use of bed rails; use of adjusted bed head height Patient Response: Cooperative  Transfers Overall transfer level: Needs assistance Equipment used: None Transfers: Sit to/from Stand Sit to Stand: Min guard           General transfer comment: hand held min guard, appropriate but delayed weight shifts, no major LOB. Slow, labored movements    Ambulation/Gait Ambulation/Gait assistance: Counsellor (  Feet): 80 Feet Assistive device: 1 person hand held assist Gait Pattern/deviations:  Step-through pattern, Decreased step length - right, Decreased step length - left, Decreased stride length, Decreased dorsiflexion - right, Shuffle, Decreased dorsiflexion - left, Narrow base of support       General Gait Details: slow, labored with slow turning with 1 HHA from therapist.  Stairs            Wheelchair Mobility    Modified Rankin (Stroke Patients Only)       Balance Overall balance assessment: Needs assistance Sitting-balance support: No upper extremity supported Sitting balance-Leahy Scale: Fair Sitting balance - Comments: fair/fair posterior lean when MMT of BLE Postural control: Posterior lean Standing balance support: Single extremity supported Standing balance-Leahy Scale: Fair Standing balance comment: fair/fair requiring 1 HHA, delayed postural/anticipatory reactions.                             Pertinent Vitals/Pain Pain Assessment Pain Assessment: Faces Faces Pain Scale: No hurt    Home Living Family/patient expects to be discharged to:: Private residence Living Arrangements: Alone Available Help at Discharge: Family;Friend(s) Type of Home: Mobile home Home Access: Level entry       Home Layout: One level Home Equipment: Cane - single point      Prior Function Prior Level of Function : Independent/Modified Independent             Mobility Comments: Reports independence with all functional mobility; intermittently uses SPC when getting fatigued. ADLs Comments: Independent with all clothing, showering, cleaning, per daughter, pt's friend come provide meals     Hand Dominance   Dominant Hand: Right    Extremity/Trunk Assessment   Upper Extremity Assessment Upper Extremity Assessment: Generalized weakness    Lower Extremity Assessment Lower Extremity Assessment: Generalized weakness    Cervical / Trunk Assessment Cervical / Trunk Assessment: Kyphotic  Communication   Communication: No difficulties   Cognition Arousal/Alertness: Awake/alert Behavior During Therapy: WFL for tasks assessed/performed Overall Cognitive Status: Within Functional Limits for tasks assessed                                 General Comments: Hard of hearing        General Comments      Exercises     Assessment/Plan    PT Assessment Patient needs continued PT services  PT Problem List Decreased strength;Decreased activity tolerance;Decreased balance;Decreased mobility;Decreased coordination       PT Treatment Interventions Neuromuscular re-education;Gait training;Functional mobility training;Therapeutic activities;Therapeutic exercise;Balance training    PT Goals (Current goals can be found in the Care Plan section)  Acute Rehab PT Goals Patient Stated Goal: "go home" PT Goal Formulation: With patient Time For Goal Achievement: 11/12/22 Potential to Achieve Goals: Good    Frequency Min 3X/week     Co-evaluation               AM-PAC PT "6 Clicks" Mobility  Outcome Measure Help needed turning from your back to your side while in a flat bed without using bedrails?: None Help needed moving from lying on your back to sitting on the side of a flat bed without using bedrails?: None Help needed moving to and from a bed to a chair (including a wheelchair)?: None Help needed standing up from a chair using your arms (e.g., wheelchair or bedside chair)?: A Little Help needed to  walk in hospital room?: A Little Help needed climbing 3-5 steps with a railing? : A Lot 6 Click Score: 20    End of Session Equipment Utilized During Treatment: Gait belt Activity Tolerance: Patient tolerated treatment well Patient left: in bed;with call bell/phone within reach;with family/visitor present;with bed alarm set Nurse Communication: Mobility status PT Visit Diagnosis: Unsteadiness on feet (R26.81);Other abnormalities of gait and mobility (R26.89);Muscle weakness (generalized) (M62.81)     Time: [id number] PT Time Calculation (min) (ACUTE ONLY): 30 min   Charges:   PT Evaluation $PT Eval Moderate Complexity: 1 Mod PT Treatments $Therapeutic Activity: 8-22 mins        Wonda Olds PT, DPT Physical Therapist with [facility] 336 [phone] office  Wonda Olds 10/29/2022, 3:53 PM

## 2022-10-29 NOTE — Progress Notes (Signed)
Bladder scan patient this morning revealed 205cc in urinary bladder
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
## 2022-10-29 NOTE — Progress Notes (Signed)
Continues to be Yellow MEWS d/t RR  10/29/22 1000  Assess: MEWS Score  ECG Heart Rate 72  Resp (!) 31  Assess: MEWS Score  MEWS Temp 0  MEWS Systolic 0  MEWS Pulse 0  MEWS RR 2  MEWS LOC 0  MEWS Score 2  MEWS Score Color Yellow  Assess: SIRS CRITERIA  SIRS Temperature  0  SIRS Pulse 0  SIRS Respirations  1  SIRS WBC 0  SIRS Score Sum  1

## 2022-10-29 NOTE — Progress Notes (Signed)
Patient lying in bed with eyes closed resting. All safety protocols still remain in place for patient safety.

## 2022-10-29 NOTE — Evaluation (Signed)
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    Essential hypertension    GERD (gastroesophageal reflux disease)    H/O hiatal hernia    Headache(784.0)    HOH (hard of hearing)    Hyperlipidemia    Neuromuscular disorder ([place])    Tremors   Pericardial effusion    Hemorrhagic    Presence of permanent cardiac pacemaker    Tachycardia-bradycardia syndrome ([place])    s/p Medtronic Adapta L dual chamber device  5/10   Past Surgical History:  Past Surgical History:  Procedure Laterality Date   APPENDECTOMY     BIOPSY  02/24/2022   Procedure: BIOPSY;  Surgeon: Irving Copas., MD;  Location: [facility];  Service: Gastroenterology;;   CARDIAC CATHETERIZATION  2010   stents x2.   CATARACT EXTRACTION     CHOLECYSTECTOMY     COLONOSCOPY  2011   COLONOSCOPY N/A 10/19/2014   Procedure: COLONOSCOPY;  Surgeon: Rogene Houston, MD;  [facility]: [facility];  Service: Endoscopy;  Laterality: N/A;  1030   CORONARY STENT INTERVENTION N/A 05/28/2021   Procedure: CORONARY STENT INTERVENTION;  Surgeon: Leonie Man, MD;  Location: [place] [facility];  Service: Cardiovascular;  Laterality: N/A;   ESOPHAGEAL DILATION N/A 05/02/2020   Procedure: ESOPHAGEAL DILATION;  Surgeon: Rogene Houston, MD;  [facility];  Service: Gastroenterology;  Laterality: N/A;   ESOPHAGOGASTRODUODENOSCOPY N/A 10/26/2014   Procedure: ESOPHAGOGASTRODUODENOSCOPY (EGD);   Surgeon: Rogene Houston, MD;  [facility];  Service: Endoscopy;  Laterality: N/A;  13 - Dr. has lunch and learn   ESOPHAGOGASTRODUODENOSCOPY N/A 02/24/2022   Procedure: ESOPHAGOGASTRODUODENOSCOPY (EGD);  Surgeon: Irving Copas., MD;  Location: [facility];  Service: Gastroenterology;  Laterality: N/A;   ESOPHAGOGASTRODUODENOSCOPY (EGD) WITH PROPOFOL N/A 05/02/2020   Procedure: ESOPHAGOGASTRODUODENOSCOPY (EGD) WITH PROPOFOL;  Surgeon: Rogene Houston, MD;  [facility];  Service: Gastroenterology;  Laterality: N/A;  250   Esophagogastroduodenoscopy with esophageal dilation  2004, 2006, 2007   EUS N/A 02/24/2022   Procedure: UPPER ENDOSCOPIC ULTRASOUND (EUS) LINEAR;  Surgeon: Irving Copas., MD;  Location: [facility];  Service: Gastroenterology;  Laterality: N/A;   FINE NEEDLE ASPIRATION  02/24/2022   Procedure: FINE NEEDLE ASPIRATION;  Surgeon: Rush Landmark Telford Nab., MD;  Location: [facility];  Service: Gastroenterology;;  red path sent   GIVENS CAPSULE STUDY N/A 10/31/2014   Procedure: GIVENS CAPSULE STUDY;  Surgeon: Rogene Houston, MD;  [facility];  Service: Endoscopy;  Laterality: N/A;  730 -- pacemaker--needs monitoring--outpatient bed   INSERT / REPLACE / REMOVE PACEMAKER  2010   INTRAVASCULAR ULTRASOUND/IVUS N/A 05/28/2021   Procedure: Intravascular Ultrasound/IVUS;  Surgeon: Leonie Man, MD;  Location: [place] [facility];  Service: Cardiovascular;  Laterality: N/A;   LEFT HEART CATH AND CORONARY ANGIOGRAPHY N/A 03/21/2021   Procedure: LEFT HEART CATH AND CORONARY ANGIOGRAPHY;  Surgeon: Tamala Julian,  Lynnell Dike, MD;  Location: [place] [facility];  Service: Cardiovascular;  Laterality: N/A;   LEFT HEART CATHETERIZATION WITH CORONARY ANGIOGRAM N/A 11/17/2011   Procedure: LEFT HEART CATHETERIZATION WITH CORONARY ANGIOGRAM;  Surgeon: Sherren Mocha, MD;  Location: [facility] CATH LAB;  Service: Cardiovascular;  Laterality: N/A;   LEFT HEART  CATHETERIZATION WITH CORONARY ANGIOGRAM N/A 09/26/2014   Procedure: LEFT HEART CATHETERIZATION WITH CORONARY ANGIOGRAM;  Surgeon: Leonie Man, MD;  [facility] C[facility];  Service: Cardiovascular;  Laterality: N/A;   PPM GENERATOR CHANGEOUT N/A 07/16/2022   Procedure: PPM GENERATOR CHANGEOUT;  Surgeon: Evans Lance, MD;  Location: [place] [facility];  Service: Cardiovascular;  Laterality: N/A;   Right rotator cuff repair     SAVORY DILATION N/A 02/24/2022   Procedure: SAVORY DILATION;  Surgeon: Rush Landmark Telford Nab., MD;  Location: [facility];  Service: Gastroenterology;  Laterality: N/A;   SHOULDER ACROMIOPLASTY Right 05/30/2015   Procedure: RIGHT SHOULDER ACROMIOPLASTY;  Surgeon: Carole Civil, MD;  Location: [facility];  Service: Orthopedics;  Laterality: Right;   SHOULDER OPEN ROTATOR CUFF REPAIR Right 05/30/2015   Procedure: OPEN ROTATOR CUFF REPAIR RIGHT SHOULDER;  Surgeon: Carole Civil, MD;  Location: [facility];  Service: Orthopedics;  Laterality: Right;   SHOULDER OPEN ROTATOR CUFF REPAIR Left 10/22/2016   Procedure: ROTATOR CUFF REPAIR SHOULDER OPEN;  Surgeon: Carole Civil, MD;  Location: [facility];  Service: Orthopedics;  Laterality: Left;   Subxiphoid pericardial window  11/10   VAGINAL HYSTERECTOMY     HPI:  [name] is a 87 y.o. female with medical history significant of chronic respiratory failure on supplemental oxygen at 2 LPM at night, COPD, atrial fibrillation, CHF, suspected pancreatic cancer, tachycardia-bradycardia syndrome s/p pacemaker, CAD who presented to the emergency department.  Patient was unable to provide a history due to altered mental status, she was suspicious of staff wanting to kill her.  History was obtained from ED physician and ED medical record.  Per report, family noted that patient's BP was elevated, apparently she stopped taking her medications because she states that she saw her usual symptoms last night and told to stop taking all her  meds and she told EMS team that she was ready to die.  Patient states that she lost her baby years ago on this date and was upset.  ED physician states that if called patient's granddaughter who told him that patient called and said that she wanted to come to die in her house.    Assessment / Plan / Recommendation  Clinical Impression  Clinical swallowing evaluation completed; Pt reports difficulty "swallowing", reporting food just "won't go down". Pt reports for months she has been eating mostly puree type food and drinks protein shakes; she reports she is unable to eat meats. Further reports hx of esophageal dilation, however she reports "I'm not doing that again though, I just want to go home". Pt consumed thin liquids without overt s/sx of aspiration. She consumed solids and puree with esophageal symptoms including belching, facial grimacing and reports of globus sensation. Recommend consider GI consult, however Pt indicates she may decline any recommended procedures. Recommend downgrade Pt's diet to puree/D1 and continue with thin liquids. Recommend crush meds in puree. ST will continue to follow for diet tolerance SLP Visit Diagnosis: Dysphagia, unspecified (R13.10)    Aspiration Risk  Mild aspiration risk    Diet Recommendation Dysphagia 1 (Puree);Thin liquid   Liquid Administration via: Cup;Straw Medication Administration: Crushed with puree Supervision:  Patient able to self feed Compensations: Slow rate;Small sips/bites;Follow solids with liquid Postural Changes: Seated upright at 90 degrees    Other  Recommendations Recommended Consults: Consider GI evaluation;Consider esophageal assessment Oral Care Recommendations: Oral care BID    Recommendations for follow up therapy are one component of a multi-disciplinary discharge planning process, led by the attending physician.  Recommendations may be updated based on patient status, additional functional criteria and insurance  authorization.           Frequency and Duration min 1 x/week  1 week       Prognosis Prognosis for improved oropharyngeal function: Fair      Swallow Study   General Date of Onset: 10/27/22 HPI: [name] is a 87 y.o. female with medical history significant of chronic respiratory failure on supplemental oxygen at 2 LPM at night, COPD, atrial fibrillation, CHF, suspected pancreatic cancer, tachycardia-bradycardia syndrome s/p pacemaker, CAD who presented to the emergency department.  Patient was unable to provide a history due to altered mental status, she was suspicious of staff wanting to kill her.  History was obtained from ED physician and ED medical record.  Per report, family noted that patient's BP was elevated, apparently she stopped taking her medications because she states that she saw her usual symptoms last night and told to stop taking all her meds and she told EMS team that she was ready to die.  Patient states that she lost her baby years ago on this date and was upset.  ED physician states that if called patient's granddaughter who told him that patient called and said that she wanted to come to die in her house. Type of Study: Bedside Swallow Evaluation Previous Swallow Assessment: none in chart Diet Prior to this Study: Dysphagia 3 (mechanical soft);Thin liquids (Level 0) Temperature Spikes Noted: No Respiratory Status: Room air History of Recent Intubation: No Behavior/Cognition: Alert;Cooperative;Pleasant mood Oral Cavity Assessment: Within Functional Limits Oral Care Completed by SLP: Recent completion by staff Oral Cavity - Dentition: Edentulous Vision: Functional for self-feeding Self-Feeding Abilities: Able to feed self Patient Positioning: Upright in bed Baseline Vocal Quality: Normal Volitional Swallow: Able to elicit    Oral/Motor/Sensory Function Overall Oral Motor/Sensory Function: Within functional limits   Ice Chips Ice chips: Within functional  limits   Thin Liquid Thin Liquid: Within functional limits    Nectar Thick Nectar Thick Liquid: Not tested   Honey Thick Honey Thick Liquid: Not tested   Puree Puree: Within functional limits   Solid    Ellianah Cordy H. Roddie Mc, CCC-SLP Speech Language Pathologist  Solid: Within functional limits      Wende Bushy 10/29/2022,2:36 PM

## 2022-10-29 NOTE — Progress Notes (Signed)
   10/29/22 0147  Assess: MEWS Score  Temp 98 F (36.7 C)  BP (!) 160/91  MAP (mmHg) 111  Pulse Rate 76  Resp (!) 30  Level of Consciousness Alert  SpO2 99 %  O2 Device Nasal Cannula  O2 Flow Rate (L/min) 2 L/min  Assess: MEWS Score  MEWS Temp 0  MEWS Systolic 0  MEWS Pulse 0  MEWS RR 2  MEWS LOC 0  MEWS Score 2  MEWS Score Color Yellow  Assess: if the MEWS score is Yellow or Red  Were vital signs taken at a resting state? Yes  Focused Assessment Change from prior assessment (see assessment flowsheet)  Does the patient meet 2 or more of the SIRS criteria? No  Does the patient have a confirmed or suspected source of infection? Yes  Provider and Rapid Response Notified? No (MD notified)  MEWS guidelines implemented  Yes, yellow  Treat  MEWS Interventions Considered administering scheduled or prn medications/treatments as ordered  Take Vital Signs  Increase Vital Sign Frequency  Yellow: Q2hr x1, continue Q4hrs until patient remains green for 12hrs  Escalate  MEWS: Escalate Yellow: Discuss with charge nurse and consider notifying provider and/or RRT  Notify: Charge Nurse/RN  Name of Charge Nurse/RN Notified  Lawernce Keas, RN)  Provider Notification  Provider Name/Title  (Dr. Josephine Cables)  Date Provider Notified 10/29/22  Time Provider Notified 0150  Method of Notification Page  Notification Reason Other (Comment) (BP and RR and yellow mews)  Provider response No new orders;Other (Comment) (no response)  Date of Provider Response  (N/A)  Time of Provider Response  (N/A)  Assess: SIRS CRITERIA  SIRS Temperature  0  SIRS Pulse 0  SIRS Respirations  1  SIRS WBC 0  SIRS Score Sum  1   Was called into room by patient requesting something to help her sleep. Pt. Lying in bed with eyes open and mouth open (mouth breathing). Upon checking on patient, pt. Seemed more SOB than previous assessment. Patient denied chest pain, however, did complain of abdomen discomfort, stated  that she felt like her belly felt very full, adding pressure on her chest, causing her to have to have to breath harder. Head of bed elevated. Pt. Already had O2 on 2 L via nasal cannula. Vitals signs were taken and see above. Manuel BP taken and was 156/80 HR 76. Patient turned a yellow mews. Charge nurse notified. MD notified. No new orders. Yellow Mews protocol implemented. Patient stated that she feels sleepy. Stayed with patient approximately 30 minutes to monitor. Patient in bed with eyes closed resting, has mouth open. All safety protocols in place: bed in lowest position, bed alarm on, yellow sock son, call bed and belongings within reach, yellow fall risk bracelet on, patient told to call if she needs anything.

## 2022-10-29 NOTE — Progress Notes (Signed)
Mobility Specialist Progress Note:    10/29/22 1057  Mobility  Activity Ambulated with assistance in hallway  Level of Assistance Contact guard assist, steadying assist  Assistive Device None  Distance Ambulated (ft) 140 ft  Activity Response Tolerated well  Mobility Referral Yes  $Mobility charge 1 Mobility   Pt agreeable to mobility session. Tolerated well, asx throughout. SpO2 96% on RA during ambulation. Returned pt to bed, alarm on, call bell in reach, all needs met.   Royetta Crochet Mobility Specialist Please contact via [facility] or  Rehab office at [phone]

## 2022-10-29 NOTE — Plan of Care (Signed)
  Problem: Acute Rehab PT Goals(only PT should resolve) Goal: Pt Will Go Supine/Side To Sit Flowsheets (Taken 10/29/2022 1557) Pt will go Supine/Side to Sit: Independently Goal: Patient Will Perform Sitting Balance Flowsheets (Taken 10/29/2022 1557) Patient will perform sitting balance: Independently Goal: Patient Will Transfer Sit To/From Stand Flowsheets (Taken 10/29/2022 1557) Patient will transfer sit to/from stand: Independently Goal: Pt Will Ambulate Flowsheets (Taken 10/29/2022 1557) Pt will Ambulate:  > 125 feet  Independently  with modified independence   [name] PT, DPT Physical Therapist with [facility] [phone] office

## 2022-10-29 NOTE — Progress Notes (Signed)
   10/29/22 2028  Assess: MEWS Score  Temp 97.6 F (36.4 C)  BP (!) 150/77  MAP (mmHg) 100  Pulse Rate 66  Resp (!) 26  Level of Consciousness Alert  SpO2 100 %  O2 Device Nasal Cannula  O2 Flow Rate (L/min) 2 L/min  Assess: MEWS Score  MEWS Temp 0  MEWS Systolic 0  MEWS Pulse 0  MEWS RR 2  MEWS LOC 0  MEWS Score 2  MEWS Score Color Yellow  Assess: if the MEWS score is Yellow or Red  Were vital signs taken at a resting state? Yes  Focused Assessment No change from prior assessment  Does the patient meet 2 or more of the SIRS criteria? No  Does the patient have a confirmed or suspected source of infection? Yes  Provider and Rapid Response Notified? No  MEWS guidelines implemented  Yes, yellow  Treat  MEWS Interventions Considered administering scheduled or prn medications/treatments as ordered  Take Vital Signs  Increase Vital Sign Frequency  Yellow: Q2hr x1, continue Q4hrs until patient remains green for 12hrs  Escalate  MEWS: Escalate Yellow: Discuss with charge nurse and consider notifying provider and/or RRT  Notify: Charge Nurse/RN  Name of Charge Nurse/RN Notified  Gordy Savers, RN)  Provider Notification  Provider Name/Title  (Dr. Clearence Ped)  Date Provider Notified 10/29/22  Time Provider Notified 2130  Method of Notification Page (Secure chat)  Notification Reason Other (Comment) (Yellow mews)  Provider response No new orders  Date of Provider Response 10/29/22  Time of Provider Response 2350  Assess: SIRS CRITERIA  SIRS Temperature  0  SIRS Pulse 0  SIRS Respirations  1  SIRS WBC 0  SIRS Score Sum  1   Was notified by tech that patient turned  a yellow mews due to RR. Went to assess pt.,pt. lying in bed with eyes open. Asked patient if she felt okay and she became very tearful and started crying.  Pt. Was asking to explain her emotions and feelings if she felt comfortable or up to it. Patient started discussing about how her son had died when he  was a baby and about how her other son died when he was 31 years old. She told em about how she wants all of her kids to go to be with the [place] one day, but that she is fearful for their souls since they aren't living right. Noticed patient started breathing a little heavier while discussing information. Emotional support was given to patient. Patient started to calm down a little bit. Will continue to monitor patient.

## 2022-10-30 ENCOUNTER — Telehealth: Payer: Self-pay | Admitting: *Deleted

## 2022-10-30 ENCOUNTER — Other Ambulatory Visit: Payer: Self-pay | Admitting: *Deleted

## 2022-10-30 DIAGNOSIS — J189 Pneumonia, unspecified organism: Secondary | ICD-10-CM

## 2022-10-30 LAB — LEGIONELLA PNEUMOPHILA SEROGP 1 UR AG: L. pneumophila Serogp 1 Ur Ag: NEGATIVE

## 2022-10-30 MED ORDER — DOXYCYCLINE HYCLATE 100 MG PO TABS: 100.0000 mg | ORAL_TABLET | Freq: Two times a day (BID) | ORAL | 0 refills | Status: AC

## 2022-10-30 MED ORDER — MELATONIN 3 MG PO TABS
6.0000 mg | ORAL_TABLET | Freq: Every day | ORAL | Status: DC
  Administered 2022-10-30: 6 mg via ORAL
  Filled 2022-10-30: qty 2

## 2022-10-30 NOTE — Progress Notes (Signed)
  Care Coordination   Note   10/30/2022 Name: [name] MRN: [id number] DOB: 12-08-1935  [name] is a 87 y.o. year old female who sees [place] Crane, Edwinna Areola, MD for primary care. I reached out to Rich Number by phone today to offer care coordination services.  Ms. Bhandari was given information about Care Coordination services today including:   The Care Coordination services include support from the care team which includes your Nurse Coordinator, Clinical Social Worker, or Pharmacist.  The Care Coordination team is here to help remove barriers to the health concerns and goals most important to you. Care Coordination services are voluntary, and the patient may decline or stop services at any time by request to their care team member.   Care Coordination Consent Status: Patient agreed to services and verbal consent obtained.   Follow up plan:  Telephone appointment with care coordination team member scheduled for:  10/31/22  Encounter Outcome:  Pt. Scheduled  [place]  Direct Dial: [phone]

## 2022-10-30 NOTE — Care Management Important Message (Signed)
Important Message  Patient Details  Name: [name] MRN: [id number] Date of Birth: 29-Jul-1936   Medicare Important Message Given:  N/A - LOS <3 / Initial given by admissions     Tommy Medal 10/30/2022, 11:04 AM

## 2022-10-30 NOTE — Progress Notes (Signed)
Patient complained that she could not sleep. Requested something to help her sleep. Messaged MD to request medication for sleep. Med given. Patient lying in bed with eyes closed resting comfortably.   Was told by nurse tech and another nurse that patient attempted to get out of bed and took nasal cannula off stating that this was another way for them to control her and it was a work of the Hydrologist. Patient was reoriented.   Was told again by 2 nurse techs that patient was attempting to get out of bed and by the time tech got into the room, pt. Was out of the bed at the window in her room stating that she needed to go to the bathroom. Patient was assisted to the bathroom, then assisted back to bed. Patient was rambling on about the end of the world and the mark of the beast, per techs. Pt was reoriented accordingly. All safety protocols in place. Went to assess patient after being informed of situation. Pt. Lying in bed with eyes closed, resting. Verified that safety protocols were in place. Will continue to monitor and report off to oncoming shift.

## 2022-10-30 NOTE — Discharge Summary (Signed)
[facility]  Physician Discharge Summary   Patient ID: [name] MRN: [id number] DOB/AGE: [birth year] 85 y.o.  Admit date: 10/27/2022 Discharge date: 10/30/2022    PCP: Celene Squibb, MD  DISCHARGE DIAGNOSES:    CAP (community acquired pneumonia)   COPD (chronic obstructive pulmonary disease) ([place])   Essential hypertension, benign   Atrial fibrillation ([place])   Chronic diastolic heart failure ([place])   CAD (coronary artery disease)   Paranoid delusion ([place])   RECOMMENDATIONS FOR OUTPATIENT FOLLOW UP: Patient declines skilled nursing facility placement.  Also declines home health.    Home Health: None Equipment/Devices: None  CODE STATUS: DNR  DISCHARGE CONDITION: fair  Diet recommendation: As before  INITIAL HISTORY: [name] is a 87 y.o. female with medical history significant of chronic respiratory failure on supplemental oxygen at 2 LPM at night, COPD, atrial fibrillation, CHF, suspected pancreatic cancer, tachycardia-bradycardia syndrome s/p pacemaker, CAD who presented to the emergency department.  Patient was unable to provide a history due to altered mental status, she was suspicious of staff wanting to kill her.  History was obtained from ED physician and ED medical record.  Per report, family noted that patient's BP was elevated, apparently she stopped taking her medications because she states that she saw her usual symptoms last night and told to stop taking all her meds and she told EMS team that she was ready to die.  Patient states that she lost her baby years ago on this date and was upset.  ED physician states that if called patient's granddaughter who told him that patient called and said that she wanted to come to die in her house. Patient lives alone and a neighbor checks on her daily.  Granddaughter states that patient was very religious and thinks that she has been depressed due to cancer, heart problems, nausea and vomiting.  There was no reported  history of dementia.  Consultations: Psychiatry Palliative medicine  Procedures: None   HOSPITAL COURSE:   Community-acquired pneumonia Patient was started on ceftriaxone and doxycycline.  Now just on oral doxycycline.  Respiratory status noted to be stable. She is noted to be on 2 L of oxygen by nasal cannula.  Apparently uses oxygen at home.   Paranoid delusional Patient was reported to stop taking all her medications because she states that she is also asked to stop taking her meds She also stated that she wants to die Seen by behavioral health.  No indication for behavioral health hospitalization. Given Zyprexa here in the hospital.  Will not be continued at home since she is on other psychotropic agents at home.   Hypokalemia   Prolonged QT interval 8 EKG shows improvement in QT interval   Hypertensive urgency-resolved Continue home medications   Chronic atrial fibrillation s/p pacemaker implantation Continue aspirin, Toprol-XL Amiodarone currently held due to QT prolongation Not on anticoagulation for unclear reasons.  Will defer to outpatient providers..   Suspected pancreatic cancer -last GI note: mixed type IPMN. We discussed the possibility that this lesion may become a cancer in the future but also we discussed that she had multiple comorbidities and has advanced age which make her high risk surgical candidate for a high risk surgery. We discussed the benefits and possible risk of proceeding with a Whipple surgery if she were to have worsening of her pancreatic lesions. For now, we will continue the current recommendation for surveillance as discussed in the multidisciplinary tumor board.  Plan is to repeat a CTA pancreatic  study in the near future. CA 19-9 was noted to be normal at 14 back in February 2024. Outpatient follow-up with gastroenterology   Chronic diastolic CHF Echo from 0000000 with EF of 55 to 60%, no regional wall motion abnormalities Continue  home meds   Tachycardia-bradycardia syndrome Patient has a permanent pacemaker and follows with Dr. Lovena Le per medical record   GERD   CAD s/p stent placement   Continue home oxygen   COPD   Anxiety   Tremor   Goals of care Palliative medicine was consulted.  Noted to be DNR.   Patient is stable.  Seen by PT who recommends short-term rehab and skilled nursing facility.  Patient declines this.  Does not want to go to nursing facility.  Wants to go home.  Discussed with her daughter who mentioned that she will try to convince her.  Informed by case management that patient and family have decided against skilled nursing facility as well as home health.  Patient is stable for discharge home today.   PERTINENT LABS:  The results of significant diagnostics from this hospitalization (including imaging, microbiology, ancillary and laboratory) are listed below for reference.    Microbiology: Recent Results (from the past 240 hour(s))  Blood culture (routine x 2)     Status: None (Preliminary result)   Collection Time: 10/27/22 11:53 PM   Specimen: BLOOD  Result Value Ref Range Status   Specimen Description BLOOD BLOOD RIGHT ARM  Final   Special Requests   Final    BOTTLES DRAWN AEROBIC AND ANAEROBIC Blood Culture adequate volume   Culture   Final    NO GROWTH 2 DAYS Performed at [facility], [address], [place], [place] [address]    Report Status PENDING  Incomplete  Blood culture (routine x 2)     Status: None (Preliminary result)   Collection Time: 10/27/22 11:53 PM   Specimen: BLOOD  Result Value Ref Range Status   Specimen Description BLOOD BLOOD RIGHT HAND  Final   Special Requests   Final    BOTTLES DRAWN AEROBIC AND ANAEROBIC Blood Culture results may not be optimal due to an excessive volume of blood received in culture bottles   Culture   Final    NO GROWTH 2 DAYS Performed at [facility], [address] [place], [place] [address]    Report Status PENDING   Incomplete     Labs:   Basic Metabolic Panel: Recent Labs  Lab 10/27/22 2033 10/28/22 0501 10/29/22 0358  NA 139 141 137  K 3.4* 3.2* 3.8  CL 104 106 106  CO2 22 24 24   GLUCOSE 133* 105* 89  BUN 10 9 17   CREATININE 0.68 0.64 0.68  CALCIUM 9.8 9.3 9.1  MG 2.2 2.2  --   PHOS  --  2.8  --    Liver Function Tests: Recent Labs  Lab 10/27/22 2033 10/28/22 0501  AST 35 30  ALT 22 20  ALKPHOS 97 88  BILITOT 1.1 1.0  PROT 8.0 7.3  ALBUMIN 4.2 3.8   Recent Labs  Lab 10/27/22 2033  LIPASE 33    CBC: Recent Labs  Lab 10/27/22 2106 10/28/22 0501 10/29/22 0358  WBC 11.8* 9.3 7.6  HGB 14.3 14.3 12.0  HCT 43.5 44.0 37.2  MCV 91.0 92.6 94.7  PLT 204 199 168     IMAGING STUDIES DG Chest 2 View  Result Date: 10/27/2022 CLINICAL DATA:  Altered mental status EXAM: CHEST - 2 VIEW COMPARISON:  06/29/2022 FINDINGS: Left-sided pacing device as before. Cardiomegaly. Heterogeneous ground-glass opacities in the right upper lobe and left mid to lower lung. Aortic atherosclerosis. No pneumothorax. IMPRESSION: Heterogeneous ground-glass opacities in the right upper lobe and left mid to lower lung, suspect for multifocal pneumonia. Cardiomegaly. Electronically Signed   By: Donavan Foil M.D.   On: 10/27/2022 21:58   CT Head Wo Contrast  Result Date: 10/27/2022 CLINICAL DATA:  Altered mental status EXAM: CT HEAD WITHOUT CONTRAST TECHNIQUE: Contiguous axial images were obtained from the base of the skull through the vertex without intravenous contrast. RADIATION DOSE REDUCTION: This exam was performed according to the departmental dose-optimization program which includes automated exposure control, adjustment of the mA and/or kV according to patient size and/or use of iterative reconstruction technique. COMPARISON:  CT brain 12/11/2021 FINDINGS: Brain: No acute territorial infarction, hemorrhage or intracranial mass. Probable chronic lacunar infarct left basal ganglia. Patchy white matter  hypodensity consistent with chronic small vessel ischemic change. Mild atrophy. Nonenlarged ventricles Vascular: No hyperdense vessels.  Carotid vascular calcification Skull: Normal. Negative for fracture or focal lesion. Sinuses/Orbits: No acute finding. Other: None IMPRESSION: 1. No CT evidence for acute intracranial abnormality. 2. Atrophy and chronic small vessel ischemic changes of the white matter. Electronically Signed   By: Donavan Foil M.D.   On: 10/27/2022 21:32   CUP PACEART REMOTE DEVICE CHECK  Result Date: 10/23/2022 Scheduled remote reviewed. Normal device function.  Next remote 91 days. LA  CT PANCREAS ABD W/WO  Result Date: 10/08/2022 CLINICAL DATA:  Follow-up pancreatic lesions. EXAM: CT ABDOMEN WITHOUT AND WITH CONTRAST TECHNIQUE: Multidetector CT imaging of the abdomen was performed following the standard protocol before and following the bolus administration of intravenous contrast. RADIATION DOSE REDUCTION: This exam was performed according to the departmental dose-optimization program which includes automated exposure control, adjustment of the mA and/or kV according to patient size and/or use of iterative reconstruction technique. CONTRAST:  168mL OMNIPAQUE IOHEXOL 300 MG/ML  SOLN COMPARISON:  Multiple priors including CT June 29, 2022 and May 01, 2022. FINDINGS: Lower chest: No acute abnormality. Right-sided cardiac enlargement. Partially visualized intracardiac leads. Calcifications of the mitral annulus. Hepatobiliary: Calcified hepatic granulomata. No suspicious hepatic lesion. Gallbladder surgically absent. No biliary ductal dilation. Pancreas: Overall similar size and appearance of the multiple pancreatic cystic lesions: For reference: -cystic lesion in the central pancreatic head adjacent to the portal confluence measures 17 x 12 mm on image 51/11 previously 15 x 10 mm. -cystic lesion in the pancreatic uncinate process measures 12 x 8 mm on image 58/11 previously 13 x  9 mm -cystic lesion in the inferior aspect of the pancreatic uncinate process measures 8 mm on image 67/11, unchanged. -cystic lesion in the superior aspect of the pancreatic head measuring 6 mm on image 40/11, unchanged when remeasured for consistency -Adjacent cystic lesion in the superior aspect of the pancreatic head measures 9 x 5 mm on image 38/11 previously 10 x 6 mm when remeasured for consistency. Similar pancreatic atrophy in the body and tail with prominence of the pancreatic duct in the head of the pancreas measuring 3.5 cm on image 47/11. No evidence of acute pancreatic inflammation. Spleen: Calcified splenic granulomata. Adrenals/Urinary Tract: Bilateral adrenal glands appear normal. Left lower pole renal scarring. Kidneys demonstrate symmetric enhancement and excretion of contrast material. Stomach/Bowel: No radiopaque enteric contrast material was administered. Stomach is unremarkable for degree of distension. No pathologic dilation or evidence of acute inflammation involving loops of large or small bowel in the  abdomen. Vascular/Lymphatic: Aortic atherosclerosis. Smooth IVC contours. No pathologically enlarged abdominal or pelvic lymph nodes. Other: No significant abdominal free fluid. Musculoskeletal: Diffuse demineralization of bone. Multilevel degenerative changes spine with multilevel vacuum disc artifact. No acute osseous abnormality. IMPRESSION: 1. Overall similar size and appearance of the multiple pancreatic cystic lesions measuring up to 17 mm in the central pancreatic head adjacent to the portal confluence. Recommend follow up imaging in 6 months preferably with pre and postcontrast enhanced MRI/MRCP. Otherwise continued surveillance with pre and postcontrast enhanced pancreatic protocol CT. This recommendation follows ACR consensus guidelines: Management of Incidental Pancreatic Cysts: A White Paper of the ACR Incidental Findings Committee. J Am Coll Radiol [id number]. 2. Similar  pancreatic atrophy with prominence of the pancreatic duct in the head of the pancreas measuring 3.5 mm. 3. No evidence of acute pancreatic inflammation. 4.  Aortic Atherosclerosis (ICD10-I70.0). Electronically Signed   By: Dahlia Bailiff M.D.   On: 10/08/2022 13:47    DISCHARGE EXAMINATION: Vitals:   10/29/22 2217 10/29/22 2252 10/30/22 0224 10/30/22 0532  BP: (!) 128/56  (!) 128/56 138/80  Pulse: 67  67 69  Resp: (!) 26 (!) 27 (!) 26 (!) 28  Temp: 97.7 F (36.5 C) 97.6 F (36.4 C) 97.7 F (36.5 C) 97.6 F (36.4 C)  TempSrc:   Oral   SpO2:   98% 100%  Weight:      Height:       General appearance: Awake alert.  In no distress Resp: Clear to auscultation bilaterally.  Normal effort Cardio: S1-S2 is normal regular.  No S3-S4.  No rubs murmurs or bruit GI: Abdomen is soft.  Nontender nondistended.  Bowel sounds are present normal.  No masses organomegaly   DISPOSITION: Home with family  Discharge Instructions     Call MD for:  difficulty breathing, headache or visual disturbances   Complete by: As directed    Call MD for:  extreme fatigue   Complete by: As directed    Call MD for:  persistant dizziness or light-headedness   Complete by: As directed    Call MD for:  persistant nausea and vomiting   Complete by: As directed    Call MD for:  severe uncontrolled pain   Complete by: As directed    Call MD for:  temperature >100.4   Complete by: As directed    Discharge instructions   Complete by: As directed    Please take your medications as prescribed.  Please be sure to follow-up with your primary care provider within 1 to 2 weeks.  You will need to eat pured diet for safety.  You were cared for by a hospitalist during your hospital stay. If you have any questions about your discharge medications or the care you received while you were in the hospital after you are discharged, you can call the unit and asked to speak with the hospitalist on call if the hospitalist that took  care of you is not available. Once you are discharged, your primary care physician will handle any further medical issues. Please note that NO REFILLS for any discharge medications will be authorized once you are discharged, as it is imperative that you return to your primary care physician (or establish a relationship with a primary care physician if you do not have one) for your aftercare needs so that they can reassess your need for medications and monitor your lab values. If you do not have a primary care physician, you can call [phone] for  a physician referral.   Increase activity slowly   Complete by: As directed           Allergies as of 10/30/2022       Reactions   Amitriptyline Hcl Other (See Comments)   Caused "jaws to twist and lock"   Plavix [clopidogrel Bisulfate] Hives   Sulfonamide Derivatives Other (See Comments)   UNKNOWN REACTION        Medication List     TAKE these medications    ALPRAZolam 1 MG tablet Commonly known as: XANAX Take 1 mg by mouth 3 (three) times daily as needed for anxiety.   [place]ist Spray 0.65 % nasal spray Generic drug: sodium chloride   amiodarone 200 MG tablet Commonly known as: PACERONE TAKE 1 TABLET ONCE DAILY ON MONDAY THROUGH SATURDAY, NONE ON SUNDAY. What changed: See the new instructions.   amLODipine 5 MG tablet Commonly known as: NORVASC Take 1 tablet (5 mg total) by mouth daily.   aspirin EC 81 MG tablet Take 1 tablet (81 mg total) by mouth daily with breakfast. Swallow whole.   dicyclomine 10 MG capsule Commonly known as: BENTYL Take 1 capsule (10 mg total) by mouth 2 (two) times daily as needed for spasms.   doxycycline 100 MG tablet Commonly known as: VIBRA-TABS Take 1 tablet (100 mg total) by mouth every 12 (twelve) hours for 4 days.   furosemide 40 MG tablet Commonly known as: LASIX Take 1 tablet (40 mg total) by mouth daily as needed for fluid or edema.   isosorbide mononitrate 30 MG 24 hr  tablet Commonly known as: IMDUR Take 1 tablet (30 mg total) by mouth daily.   Jardiance 25 MG Tabs tablet Generic drug: empagliflozin Take 25 mg by mouth daily.   metoprolol succinate 50 MG 24 hr tablet Commonly known as: TOPROL-XL Take 1 tablet (50 mg total) by mouth 2 (two) times daily. Take with or immediately following a meal.   multivitamin with minerals Tabs tablet Take 1 tablet by mouth daily.   nitroGLYCERIN 0.4 MG SL tablet Commonly known as: NITROSTAT Place 1 tablet (0.4 mg total) under the tongue every 5 (five) minutes x 3 doses as needed for chest pain.   ondansetron 4 MG tablet Commonly known as: ZOFRAN Take 4 mg by mouth every 6 (six) hours.   OXYGEN Inhale 2 L into the lungs as needed (PRN as needed at night).   pantoprazole 40 MG tablet Commonly known as: PROTONIX TAKE (1) TABLET BY MOUTH ONCE DAILY.   potassium chloride 10 MEQ tablet Commonly known as: KLOR-CON Take 1 tablet (10 mEq total) by mouth daily. Take While taking Lasix/furosemide   primidone 50 MG tablet Commonly known as: MYSOLINE Take 50 mg by mouth at bedtime.   ProAir HFA 108 (90 Base) MCG/ACT inhaler Generic drug: albuterol Inhale 1 puff into the lungs every 4 (four) hours as needed for wheezing or shortness of breath.   albuterol (2.5 MG/3ML) 0.083% nebulizer solution Commonly known as: PROVENTIL Take 3 mLs (2.5 mg total) by nebulization every 4 (four) hours as needed for wheezing or shortness of breath.   ranolazine 500 MG 12 hr tablet Commonly known as: RANEXA Take 1 tablet (500 mg total) by mouth 2 (two) times daily.   rosuvastatin 40 MG tablet Commonly known as: Crestor Take 1 tablet (40 mg total) by mouth daily.   senna-docusate 8.6-50 MG tablet Commonly known as: Senokot-S Take 1 tablet by mouth at bedtime. What changed:  how much to take  when to take this reasons to take this   ticagrelor 60 MG Tabs tablet Commonly known as: Brilinta Take 1 tablet (60 mg total) by  mouth 2 (two) times daily.   triamcinolone cream 0.1 % Commonly known as: KENALOG APPLY A THIN LAYER TO THE AFFECTED AREA(S) BY TOPICAL ROUTE 2 TIMES PER DAY   Vitamin B-12 5000 MCG Subl Take 5,000 mcg by mouth daily.   vitamin E 180 MG (400 UNITS) capsule Generic drug: vitamin E Take 400 Units by mouth daily.          Follow-up Information     Celene Squibb, MD. Schedule an appointment as soon as possible for a visit in 1 week(s).   Specialty: Internal Medicine Why: post hospitalization follow up Contact information: [facility] [facility] [address] [phone]                 TOTAL DISCHARGE TIME: 44 minutes  [place]  [facility] Pager on [email or web address]  10/30/2022, 11:29 AM

## 2022-10-30 NOTE — Consult Note (Signed)
   [facility] CM Inpatient Consult   10/30/2022  [name] [birth year] [id number]   Orientation with Natividad Brood, [facility] Liaison for review.   Location: [facility] Liaison screened remotely [facility]).   [facility]) [place] Patient: Insurance [facility] Dual)    Primary Care Provider:  Celene Squibb, MD at [place], MD [facility]   Patient screened for readmission hospitalization with noted Medium risk score and 2 IP in 6 months for unplanned readmission risk. [facility] RN liaison assessed for potential [facility]) Care Management service needs for post hospital transition for care coordination.  Spoke with the daughter April who indicates the pt has admantely declined the SNF for STR and will go home with the supportive care from her and neighbors. States pt drives but will have available transportation to take her to all her medical appointments. Daughter inquired about MCD as pt noted to have [facility] Dual plan however [facility] liaison encouraged daughter to call the customer number on the [facility] care to inquire further on pt's insurance.  Liaison inquired on [employer] as daughter states pt has declined this in the pass also and "probable won't participate".  Educated on prevention measures to keep pt safe and avoid readmission with available assistance. Daughter states she will visit daily and pt has a Life Alert set-up in the home along with neighbors that are readily available. Also states pt's floors have been done to prevent pt from falls.  Liaison provided 24 hours Nurse Advise contact number for any related issues to present readmission.     Plan:  HIPAA verified and [facility] RN liaison will make a referral for post hospital community care coordination/management to intervene on hospital prevention readmission measures, address admitting diagnosis and risk scoring.  Referral request for community care coordination: will be submitted today upon  pt's discharge.   [place] does not replace or interfere with any arrangements made by the Inpatient Transition of Care team.   For questions contact:    [name], RN, [place] Hours M-F 8:00 am to 5 pm [phone] mobile [phone] [Office toll free line][facility] Office Hours are M-F 8:30 - 5 pm 24 hour nurse advise line [phone] Conceirge  [email or web address]

## 2022-10-30 NOTE — TOC Transition Note (Signed)
Transition of Care [facility]) - CM/SW Discharge Note   Patient Details  Name: [name] MRN: [id number] Date of Birth: October 15, 1935  Transition of [facility]) CM/SW Contact:  [name], LCSW Phone Number: 10/30/2022, 10:06 AM   Clinical Narrative:     Pt stable for dc per MD. [name] with pt at bedside about SNF vs HH and she is not agreeable to either. Spoke with pt's granddaughter, [name], with pt consent. [name] states she will bring patient to her home for as long as patient agreeable to stay. When pt does go back to her home, there are family members and neighbors who check on her regularly.  Pt has a cane and a walker at home. Granddaughter does not think there are any TOC needs for dc.  Expected Discharge Plan: Skilled Nursing Facility Barriers to Discharge: Barriers Resolved   Patient Goals and CMS Choice Patient states their goals for this hospitalization and ongoing recovery are:: go home      Expected Discharge Plan and Services Expected Discharge Plan: [place] In-house Referral: Clinical Social Work     Living arrangements for the past 2 months: Single Family Home Expected Discharge Date: 10/30/22                         HH Arranged: Refused HH          Prior Living Arrangements/Services Living arrangements for the past 2 months: Single Family Home Lives with:: Self Patient language and need for interpreter reviewed:: Yes Do you feel safe going back to the place where you live?: Yes      Need for Family Participation in Patient Care: Yes (Comment) Care giver support system in place?: Yes (comment) Current home services: DME Criminal Activity/Legal Involvement Pertinent to Current Situation/Hospitalization: No - Comment as needed  Activities of Daily Living Home Assistive Devices/Equipment: Shower chair with back ADL Screening (condition at time of admission) Patient's cognitive ability adequate to safely complete daily activities?:  No Is the patient deaf or have difficulty hearing?: Yes Does the patient have difficulty seeing, even when wearing glasses/contacts?: No Does the patient have difficulty concentrating, remembering, or making decisions?: Yes Patient able to express need for assistance with ADLs?: Yes Does the patient have difficulty dressing or bathing?: Yes Independently performs ADLs?: Yes (appropriate for developmental age) Does the patient have difficulty walking or climbing stairs?: Yes Weakness of Legs: Left Weakness of Arms/Hands: None  Permission Sought/Granted                  Emotional Assessment Appearance:: Appears stated age Attitude/Demeanor/Rapport: Engaged Affect (typically observed): Pleasant Orientation: : Oriented to Self, Oriented to Place, Oriented to  Time, Oriented to Situation Alcohol / Substance Use: Not Applicable Psych Involvement: No (comment)  Admission diagnosis:  Disorientation [R41.0] CAP (community acquired pneumonia) [J18.9] Atrial fibrillation with RVR ([place]) [I48.91] Paranoid delusion ([place]) [F22] Secondary hypertension [I15.9] Multifocal pneumonia [J18.9] Patient Active Problem List   Diagnosis Date Noted   Paranoid delusion ([place]) 10/28/2022   Current moderate episode of major depressive disorder ([place]) 10/28/2022   CAP (community acquired pneumonia) 10/27/2022   Generalized abdominal pain 09/29/2022   Nausea without vomiting 09/29/2022   COPD (chronic obstructive pulmonary disease) ([place]) 06/29/2022   Chronic respiratory failure with hypoxia ([place]) 06/29/2022   Acute on chronic diastolic CHF (congestive heart failure) ([place]) 06/29/2022   IPMN (intraductal papillary mucinous neoplasm) 06/09/2022   Constipation 06/09/2022   Upper abdominal pain 12/03/2021  CAD (coronary artery disease) 05/28/2021   Chest pain 03/30/2021   COVID-19 virus infection 03/30/2021   Non-STEMI (non-ST elevated myocardial infarction) ([place])    Hypertensive urgency    Pancreatic  lesion 03/20/2021   Hypertensive crisis 03/19/2021   Elevated troponin 03/19/2021   Prolonged QT interval 03/19/2021   Esophageal dysphagia 07/03/2020   Nonspecific chest pain 09/15/2016   Complete rotator cuff tear of left shoulder    Anemia 10/31/2014   H/O heart artery stent 10/30/2014   Chest pain with moderate risk for cardiac etiology [id number]   Diastolic dysfunction with chronic heart failure ([place]) 08/23/2014   Chronic diastolic heart failure ([place]) 08/23/2014   Acute chest pain 08/23/2014   Acquired obstruction of both nasolacrimal ducts 06/15/2014   Lower abdominal pain 01/27/2012   Hypokalemia 06/03/2011   PERICARDIAL EFFUSION 07/10/2009   Disease of pericardium 07/10/2009   Essential hypertension, benign 04/18/2009   Coronary artery disease involving native heart with unstable angina pectoris ([place]) 04/18/2009   Benign essential hypertension 04/18/2009   PPM-Medtronic 04/04/2009   BRADYCARDIA-TACHYCARDIA SYNDROME 01/26/2009   HLD (hyperlipidemia) 12/19/2008   Anxiety 12/19/2008   Atrial fibrillation ([place]) 12/19/2008   GERD (gastroesophageal reflux disease) 12/19/2008   Abnormal involuntary movements 12/19/2008   Afib ([place]) 12/19/2008   Familial multiple lipoprotein-type hyperlipidemia 12/19/2008   PCP:  Celene Squibb, MD Pharmacy:   [facility] [place] [facility] [id number] [place] [id number] Phone: [phone] Fax: [phone]     Social Determinants of Health ([place]) Interventions    Readmission Risk Interventions    07/02/2022   12:34 PM  Readmission Risk Prevention Plan  Transportation Screening Complete  PCP or Specialist Appt within 5-7 Days Complete  Home Care Screening Complete  Medication Review (RN CM) Complete     Final next level of care: Home/Self Care Barriers to Discharge: Barriers Resolved   Patient Goals and CMS Choice      Discharge Placement                          Discharge Plan and Services Additional resources added to the After Visit Summary for   In-house Referral: Clinical Social Work                        HH Arranged: Refused HH          Social Determinants of Health (SDOH) Interventions [place]: No Food Insecurity (06/30/2022)  Housing: Low Risk  (06/30/2022)  Transportation Needs: No Transportation Needs (06/30/2022)  Utilities: Not At Risk (10/28/2022)  Depression (PHQ2-9): Low Risk  (03/25/2021)  Tobacco Use: Medium Risk (10/28/2022)     Readmission Risk Interventions    07/02/2022   12:34 PM  Readmission Risk Prevention Plan  Transportation Screening Complete  PCP or Specialist Appt within 5-7 Days Complete  Home Care Screening Complete  Medication Review (RN CM) Complete

## 2022-10-31 ENCOUNTER — Ambulatory Visit: Payer: Self-pay | Admitting: *Deleted
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
## 2022-11-01 ENCOUNTER — Encounter (HOSPITAL_COMMUNITY): Payer: Self-pay

## 2022-11-01 ENCOUNTER — Emergency Department (HOSPITAL_COMMUNITY)
Admission: EM | Admit: 2022-11-01 | Discharge: 2022-11-03 | Disposition: A | Discharge status: Home / Self Care | Payer: 59 | Attending: Emergency Medicine | Admitting: Emergency Medicine

## 2022-11-01 ENCOUNTER — Emergency Department (HOSPITAL_COMMUNITY): Payer: 59

## 2022-11-01 ENCOUNTER — Other Ambulatory Visit: Payer: Self-pay

## 2022-11-01 DIAGNOSIS — R2681 Unsteadiness on feet: Secondary | ICD-10-CM | POA: Insufficient documentation

## 2022-11-01 DIAGNOSIS — I6782 Cerebral ischemia: Secondary | ICD-10-CM | POA: Diagnosis not present

## 2022-11-01 DIAGNOSIS — R109 Unspecified abdominal pain: Secondary | ICD-10-CM | POA: Diagnosis not present

## 2022-11-01 DIAGNOSIS — S0990XA Unspecified injury of head, initial encounter: Secondary | ICD-10-CM | POA: Diagnosis not present

## 2022-11-01 DIAGNOSIS — T68XXXA Hypothermia, initial encounter: Secondary | ICD-10-CM

## 2022-11-01 DIAGNOSIS — R2689 Other abnormalities of gait and mobility: Secondary | ICD-10-CM | POA: Insufficient documentation

## 2022-11-01 DIAGNOSIS — I10 Essential (primary) hypertension: Secondary | ICD-10-CM | POA: Diagnosis not present

## 2022-11-01 DIAGNOSIS — K8689 Other specified diseases of pancreas: Secondary | ICD-10-CM | POA: Diagnosis not present

## 2022-11-01 DIAGNOSIS — M549 Dorsalgia, unspecified: Secondary | ICD-10-CM | POA: Diagnosis not present

## 2022-11-01 DIAGNOSIS — M6281 Muscle weakness (generalized): Secondary | ICD-10-CM | POA: Insufficient documentation

## 2022-11-01 DIAGNOSIS — S299XXA Unspecified injury of thorax, initial encounter: Secondary | ICD-10-CM | POA: Diagnosis not present

## 2022-11-01 DIAGNOSIS — Z743 Need for continuous supervision: Secondary | ICD-10-CM | POA: Diagnosis not present

## 2022-11-01 DIAGNOSIS — W19XXXA Unspecified fall, initial encounter: Secondary | ICD-10-CM | POA: Insufficient documentation

## 2022-11-01 DIAGNOSIS — Z043 Encounter for examination and observation following other accident: Secondary | ICD-10-CM | POA: Diagnosis not present

## 2022-11-01 DIAGNOSIS — S3991XA Unspecified injury of abdomen, initial encounter: Secondary | ICD-10-CM | POA: Diagnosis not present

## 2022-11-01 DIAGNOSIS — T1490XA Injury, unspecified, initial encounter: Secondary | ICD-10-CM | POA: Diagnosis not present

## 2022-11-01 DIAGNOSIS — J90 Pleural effusion, not elsewhere classified: Secondary | ICD-10-CM | POA: Diagnosis not present

## 2022-11-01 DIAGNOSIS — M545 Low back pain, unspecified: Secondary | ICD-10-CM | POA: Diagnosis not present

## 2022-11-01 LAB — URINALYSIS, W/ REFLEX TO CULTURE (INFECTION SUSPECTED)
Bacteria, UA: NONE SEEN
Bilirubin Urine: NEGATIVE
Glucose, UA: >=500 mg/dL — AB
Hgb urine dipstick: NEGATIVE
Ketones, ur: NEGATIVE mg/dL
Leukocytes,Ua: NEGATIVE
Nitrite: NEGATIVE
Protein, ur: NEGATIVE mg/dL
Specific Gravity, Urine: 1.002 — ABNORMAL LOW (ref 1.005–1.030)
pH: 5 (ref 5.0–8.0)

## 2022-11-01 LAB — LIPASE, BLOOD: Lipase: 39 U/L (ref 11–51)

## 2022-11-01 LAB — CBC WITH DIFFERENTIAL/PLATELET
Abs Immature Granulocytes: 0.06 10*3/uL (ref 0.00–0.07)
Basophils Absolute: 0 10*3/uL (ref 0.0–0.1)
Basophils Relative: 1 %
Eosinophils Absolute: 0 10*3/uL (ref 0.0–0.5)
Eosinophils Relative: 1 %
HCT: 41.5 % (ref 36.0–46.0)
Hemoglobin: 13.7 g/dL (ref 12.0–15.0)
Immature Granulocytes: 1 %
Lymphocytes Relative: 21 %
Lymphs Abs: 1.7 10*3/uL (ref 0.7–4.0)
MCH: 30.4 pg (ref 26.0–34.0)
MCHC: 33 g/dL (ref 30.0–36.0)
MCV: 92.2 fL (ref 80.0–100.0)
Monocytes Absolute: 0.8 10*3/uL (ref 0.1–1.0)
Monocytes Relative: 10 %
Neutro Abs: 5.3 10*3/uL (ref 1.7–7.7)
Neutrophils Relative %: 66 %
Platelets: 208 10*3/uL (ref 150–400)
RBC: 4.5 MIL/uL (ref 3.87–5.11)
RDW: 14.4 % (ref 11.5–15.5)
WBC: 7.9 10*3/uL (ref 4.0–10.5)
nRBC: 0 % (ref 0.0–0.2)

## 2022-11-01 LAB — LACTIC ACID, PLASMA
Lactic Acid, Venous: 1.9 mmol/L (ref 0.5–1.9)
Lactic Acid, Venous: 1.7 mmol/L (ref 0.5–1.9)

## 2022-11-01 LAB — COMPREHENSIVE METABOLIC PANEL
ALT: 26 U/L (ref 0–44)
AST: 36 U/L (ref 15–41)
Albumin: 4.2 g/dL (ref 3.5–5.0)
Alkaline Phosphatase: 87 U/L (ref 38–126)
Anion gap: 13 (ref 5–15)
BUN: 22 mg/dL (ref 8–23)
CO2: 23 mmol/L (ref 22–32)
Calcium: 9.7 mg/dL (ref 8.9–10.3)
Chloride: 100 mmol/L (ref 98–111)
Creatinine, Ser: 1.09 mg/dL — ABNORMAL HIGH (ref 0.44–1.00)
GFR, Estimated: 50 mL/min — ABNORMAL LOW (ref >60)
Glucose, Bld: 98 mg/dL (ref 70–99)
Potassium: 3.4 mmol/L — ABNORMAL LOW (ref 3.5–5.1)
Sodium: 136 mmol/L (ref 135–145)
Total Bilirubin: 0.6 mg/dL (ref 0.3–1.2)
Total Protein: 7.9 g/dL (ref 6.5–8.1)

## 2022-11-01 LAB — TSH: TSH: 1.82 u[IU]/mL (ref 0.350–4.500)

## 2022-11-01 LAB — TROPONIN I (HIGH SENSITIVITY)
Troponin I (High Sensitivity): 26 ng/L — ABNORMAL HIGH (ref <18)
Troponin I (High Sensitivity): 26 ng/L — ABNORMAL HIGH (ref <18)

## 2022-11-01 LAB — APTT: aPTT: 27 seconds (ref 24–36)

## 2022-11-01 LAB — PROTIME-INR
INR: 1 (ref 0.8–1.2)
Prothrombin Time: 13.5 seconds (ref 11.4–15.2)

## 2022-11-01 MED ORDER — ASPIRIN 81 MG PO TBEC
81.0000 mg | DELAYED_RELEASE_TABLET | Freq: Every day | ORAL | Status: DC
  Administered 2022-11-01 – 2022-11-02 (×2): 81 mg via ORAL
  Filled 2022-11-01 (×2): qty 1

## 2022-11-01 MED ORDER — RANOLAZINE ER 500 MG PO TB12
500.0000 mg | ORAL_TABLET | Freq: Two times a day (BID) | ORAL | Status: DC
  Administered 2022-11-01 – 2022-11-02 (×4): 500 mg via ORAL
  Filled 2022-11-01 (×4): qty 1

## 2022-11-01 MED ORDER — DICYCLOMINE HCL 10 MG PO CAPS: 10.0000 mg | ORAL_CAPSULE | Freq: Two times a day (BID) | ORAL | Status: DC | PRN

## 2022-11-01 MED ORDER — ISOSORBIDE MONONITRATE ER 30 MG PO TB24
30.0000 mg | ORAL_TABLET | Freq: Every day | ORAL | Status: DC
  Administered 2022-11-01 – 2022-11-02 (×2): 30 mg via ORAL
  Filled 2022-11-01 (×2): qty 1

## 2022-11-01 MED ORDER — POTASSIUM CHLORIDE 20 MEQ PO PACK
40.0000 meq | PACK | Freq: Once | ORAL | Status: AC
  Administered 2022-11-01: 40 meq via ORAL
  Filled 2022-11-01: qty 2

## 2022-11-01 MED ORDER — QUETIAPINE FUMARATE 25 MG PO TABS
25.0000 mg | ORAL_TABLET | Freq: Every day | ORAL | Status: DC
  Administered 2022-11-01 – 2022-11-02 (×2): 25 mg via ORAL
  Filled 2022-11-01 (×2): qty 1

## 2022-11-01 MED ORDER — SODIUM CHLORIDE 0.9 % IV BOLUS
1000.0000 mL | Freq: Once | INTRAVENOUS | Status: AC
  Administered 2022-11-01: 1000 mL via INTRAVENOUS

## 2022-11-01 MED ORDER — ALBUTEROL SULFATE HFA 108 (90 BASE) MCG/ACT IN AERS: 1.0000 | INHALATION_SPRAY | RESPIRATORY_TRACT | Status: DC | PRN

## 2022-11-01 MED ORDER — AMLODIPINE BESYLATE 5 MG PO TABS
5.0000 mg | ORAL_TABLET | Freq: Every day | ORAL | Status: DC
  Administered 2022-11-01 – 2022-11-02 (×2): 5 mg via ORAL
  Filled 2022-11-01 (×2): qty 1

## 2022-11-01 MED ORDER — SENNOSIDES-DOCUSATE SODIUM 8.6-50 MG PO TABS: 1.0000 | ORAL_TABLET | Freq: Every day | ORAL | Status: DC | PRN

## 2022-11-01 MED ORDER — TICAGRELOR 60 MG PO TABS
60.0000 mg | ORAL_TABLET | Freq: Two times a day (BID) | ORAL | Status: DC
  Administered 2022-11-01 – 2022-11-02 (×4): 60 mg via ORAL
  Filled 2022-11-01 (×9): qty 1

## 2022-11-01 MED ORDER — IOHEXOL 300 MG/ML  SOLN
100.0000 mL | Freq: Once | INTRAMUSCULAR | Status: AC | PRN
  Administered 2022-11-01: 100 mL via INTRAVENOUS

## 2022-11-01 MED ORDER — EMPAGLIFLOZIN 25 MG PO TABS
25.0000 mg | ORAL_TABLET | Freq: Every day | ORAL | Status: DC
  Administered 2022-11-01 – 2022-11-02 (×2): 25 mg via ORAL
  Filled 2022-11-01 (×5): qty 1

## 2022-11-01 MED ORDER — METOPROLOL SUCCINATE ER 50 MG PO TB24
50.0000 mg | ORAL_TABLET | Freq: Two times a day (BID) | ORAL | Status: DC
  Administered 2022-11-01 – 2022-11-02 (×4): 50 mg via ORAL
  Filled 2022-11-01 (×4): qty 1

## 2022-11-01 MED ORDER — DOXYCYCLINE HYCLATE 100 MG PO TABS
100.0000 mg | ORAL_TABLET | Freq: Two times a day (BID) | ORAL | Status: DC
  Administered 2022-11-01 – 2022-11-02 (×4): 100 mg via ORAL
  Filled 2022-11-01 (×4): qty 1

## 2022-11-01 MED ORDER — ACETAMINOPHEN 325 MG PO TABS: 650.0000 mg | ORAL_TABLET | ORAL | Status: DC | PRN

## 2022-11-01 MED ORDER — PRIMIDONE 50 MG PO TABS
50.0000 mg | ORAL_TABLET | Freq: Every day | ORAL | Status: DC
  Administered 2022-11-01 – 2022-11-02 (×2): 50 mg via ORAL
  Filled 2022-11-01 (×2): qty 1

## 2022-11-01 MED ORDER — ALBUTEROL SULFATE (2.5 MG/3ML) 0.083% IN NEBU: 2.5000 mg | INHALATION_SOLUTION | RESPIRATORY_TRACT | Status: DC | PRN

## 2022-11-01 NOTE — ED Notes (Signed)
Spoke with grand-daughter updates given regarding medical conditions.

## 2022-11-01 NOTE — ED Provider Notes (Signed)
  Physical Exam  BP 114/64   Pulse (!) 27   Temp (!) 96.7 F (35.9 C) (Rectal)   Resp (!) 23   SpO2 92%   Physical Exam Vitals and nursing note reviewed.  Constitutional:      General: She is not in acute distress.    Appearance: She is well-developed.  HENT:     Head: Normocephalic and atraumatic.     Mouth/Throat:     Mouth: Mucous membranes are moist.  Eyes:     Pupils: Pupils are equal, round, and reactive to light.  Cardiovascular:     Rate and Rhythm: Normal rate and regular rhythm.     Heart sounds: No murmur heard. Pulmonary:     Effort: Pulmonary effort is normal. No respiratory distress.     Breath sounds: Normal breath sounds.  Abdominal:     General: Abdomen is flat.     Palpations: Abdomen is soft.     Tenderness: There is no abdominal tenderness.  Musculoskeletal:     Comments: No midline C, T, L-spine tenderness.  No chest wall tenderness or crepitus.  Full painless range of motion at the bilateral upper extremities including the shoulders, elbows, wrists, hand and fingers, and in the bilateral lower extremities including the hips, knees, ankle, toes.  No focal bony tenderness, injury or deformity.  Skin:    General: Skin is warm and dry.  Neurological:     General: No focal deficit present.     Mental Status: She is alert. Mental status is at baseline.  Psychiatric:        Mood and Affect: Mood normal.        Behavior: Behavior normal.     Procedures  Procedures  ED Course / MDM   Clinical Course as of 11/01/22 0850  Sat Nov 01, 2022  0741 Received sign out pending TSH, troponin, re-assessment. Had a fall at home. Workup overall reassuring but lives alone. May need social work consult [WS]  [place] troponin unchanged.  It appears patient has had mildly elevated troponins before.  Laboratory testing notable for mild AKI, will give small fluid bolus.  Other workup reassuring.  Reassessed patient, no new injuries noted on my secondary exam.  I  discussed the patient's daughter [name], she would prefer patient go to a rehab facility given that she lives alone and now has had another fall.  Will place her as boarding status consult transitions of care team and physical therapy.  Given reassuring medical workup, no clear indication for hospital admission at this time.  Will be monitored in the emergency department. [WS]    Clinical Course User Index [WS] Cristie Hem, MD   Medical Decision Making Amount and/or Complexity of Data Reviewed Labs: ordered. Radiology: ordered. ECG/medicine tests: ordered.  Risk OTC drugs. Prescription drug management.          Cristie Hem, MD 11/01/22 [phone]

## 2022-11-01 NOTE — ED Notes (Signed)
Spoke with grand-daughter  updates given regarding her medical care.

## 2022-11-01 NOTE — ED Provider Notes (Signed)
[facility] Provider Note   CSN: [id number] Arrival date & time: 11/01/22  [id number]     History {Add pertinent medical, surgical, social history, OB history to HPI:1} Chief Complaint  Patient presents with   [name] is a 87 y.o. female.  Patient comes to the emergency department after a fall.  Patient reportedly lives alone.  She states that she was going outside to "have a look" when she fell.  Was unable to get up, pressed her life alert.  First responders found her on the floor.  Patient complaining of chest and abdominal pain at arrival.       Home Medications Prior to Admission medications   Medication Sig Start Date End Date Taking? Authorizing Provider  albuterol (PROVENTIL) (2.5 MG/3ML) 0.083% nebulizer solution Take 3 mLs (2.5 mg total) by nebulization every 4 (four) hours as needed for wheezing or shortness of breath. 07/02/22 07/02/23  Roxan Hockey, MD  ALPRAZolam Duanne Moron) 1 MG tablet Take 1 mg by mouth 3 (three) times daily as needed for anxiety. 04/04/21   [provider]  amiodarone (PACERONE) 200 MG tablet TAKE 1 TABLET ONCE DAILY ON MONDAY THROUGH SATURDAY, NONE ON SUNDAY. Patient taking differently: Take 200 mg by mouth See admin instructions. TAKE 1 TABLET ONCE DAILY ON MONDAY THROUGH SATURDAY, NONE ON SUNDAY. 08/07/22   Evans Lance, MD  amLODipine (NORVASC) 5 MG tablet Take 1 tablet (5 mg total) by mouth daily. 07/02/22   Roxan Hockey, MD  aspirin EC 81 MG tablet Take 1 tablet (81 mg total) by mouth daily with breakfast. Swallow whole. 07/02/22   Roxan Hockey, MD  Cyanocobalamin (VITAMIN B-12) 5000 MCG SUBL Take 5,000 mcg by mouth daily.    [provider]  dicyclomine (BENTYL) 10 MG capsule Take 1 capsule (10 mg total) by mouth 2 (two) times daily as needed for spasms. 09/25/22   Carlan, Deatra Robinson, NP  doxycycline (VIBRA-TABS) 100 MG tablet Take 1 tablet (100 mg total) by mouth  every 12 (twelve) hours for 4 days. 10/30/22 11/03/22  Bonnielee Haff, MD  furosemide (LASIX) 40 MG tablet Take 1 tablet (40 mg total) by mouth daily as needed for fluid or edema. 03/22/21   Florencia Reasons, MD  isosorbide mononitrate (IMDUR) 30 MG 24 hr tablet Take 1 tablet (30 mg total) by mouth daily. 07/02/22   Roxan Hockey, MD  JARDIANCE 25 MG TABS tablet Take 25 mg by mouth daily.    [provider]  metoprolol succinate (TOPROL-XL) 50 MG 24 hr tablet Take 1 tablet (50 mg total) by mouth 2 (two) times daily. Take with or immediately following a meal. 07/02/22   Emokpae, Courage, MD  Multiple Vitamin (MULTIVITAMIN WITH MINERALS) TABS tablet Take 1 tablet by mouth daily.     [provider]  nitroGLYCERIN (NITROSTAT) 0.4 MG SL tablet Place 1 tablet (0.4 mg total) under the tongue every 5 (five) minutes x 3 doses as needed for chest pain. 03/22/21   Florencia Reasons, MD  ondansetron (ZOFRAN) 4 MG tablet Take 4 mg by mouth every 6 (six) hours.    [provider]  OXYGEN Inhale 2 L into the lungs as needed (PRN as needed at night).    [provider]  pantoprazole (PROTONIX) 40 MG tablet TAKE (1) TABLET BY MOUTH ONCE DAILY. Patient not taking: Reported on 10/28/2022 03/17/22   Satira Sark, MD  potassium chloride (KLOR-CON) 10 MEQ tablet Take  1 tablet (10 mEq total) by mouth daily. Take While taking Lasix/furosemide 07/02/22   Roxan Hockey, MD  primidone (MYSOLINE) 50 MG tablet Take 50 mg by mouth at bedtime. 10/31/10   [provider]  PROAIR HFA 108 (90 Base) MCG/ACT inhaler Inhale 1 puff into the lungs every 4 (four) hours as needed for wheezing or shortness of breath.  11/10/18   [provider]  ranolazine (RANEXA) 500 MG 12 hr tablet Take 1 tablet (500 mg total) by mouth 2 (two) times daily. 07/02/22   Roxan Hockey, MD  rosuvastatin (CRESTOR) 40 MG tablet Take 1 tablet (40 mg total) by mouth daily. Patient not taking: Reported on 09/25/2022  07/02/22   Roxan Hockey, MD  senna-docusate (SENOKOT-S) 8.6-50 MG tablet Take 1 tablet by mouth at bedtime. Patient taking differently: Take 1-2 tablets by mouth daily as needed (constipation.). 03/22/21   Florencia Reasons, MD  sodium chloride (ALTAMIST SPRAY) 0.65 % nasal spray  07/20/14   [provider]  ticagrelor (BRILINTA) 60 MG TABS tablet Take 1 tablet (60 mg total) by mouth 2 (two) times daily. 07/02/22   Roxan Hockey, MD  triamcinolone cream (KENALOG) 0.1 % APPLY A THIN LAYER TO THE AFFECTED AREA(S) BY TOPICAL ROUTE 2 TIMES PER DAY 08/25/22   [provider]  vitamin E 180 MG (400 UNITS) capsule Take 400 Units by mouth daily.    [provider]      Allergies    Amitriptyline hcl, Plavix [clopidogrel bisulfate], and Sulfonamide derivatives    Review of Systems   Review of Systems  Physical Exam Updated Vital Signs BP 116/84   Pulse 92   Temp (!) 96.7 F (35.9 C) (Rectal)   Resp (!) 30   SpO2 100%  Physical Exam Vitals and nursing note reviewed.  Constitutional:      General: She is not in acute distress.    Appearance: She is well-developed.     Comments: Patient is shivering at arrival  HENT:     Head: Normocephalic and atraumatic.     Mouth/Throat:     Mouth: Mucous membranes are moist.  Eyes:     General: Vision grossly intact. Gaze aligned appropriately.     Extraocular Movements: Extraocular movements intact.     Conjunctiva/sclera: Conjunctivae normal.  Cardiovascular:     Rate and Rhythm: Normal rate and regular rhythm.     Pulses: Normal pulses.     Heart sounds: Normal heart sounds, S1 normal and S2 normal. No murmur heard.    No friction rub. No gallop.  Pulmonary:     Effort: Pulmonary effort is normal. No respiratory distress.     Breath sounds: Normal breath sounds.  Chest:     Chest wall: Tenderness present.  Abdominal:     General: Bowel sounds are normal.     Palpations: Abdomen is soft.     Tenderness: There is  generalized abdominal tenderness. There is no guarding or rebound.     Hernia: No hernia is present.  Musculoskeletal:        General: No swelling.     Cervical back: Full passive range of motion without pain, normal range of motion and neck supple. No spinous process tenderness or muscular tenderness. Normal range of motion.     Right lower leg: No edema.     Left lower leg: No edema.  Skin:    General: Skin is warm and dry.     Capillary Refill: Capillary refill takes less than  2 seconds.     Findings: No ecchymosis, erythema, rash or wound.  Neurological:     General: No focal deficit present.     Mental Status: She is alert and oriented to person, place, and time.     GCS: GCS eye subscore is 4. GCS verbal subscore is 5. GCS motor subscore is 6.     Cranial Nerves: Cranial nerves 2-12 are intact.     Sensory: Sensation is intact.     Motor: Motor function is intact.     Coordination: Coordination is intact.  Psychiatric:        Attention and Perception: Attention normal.        Mood and Affect: Mood normal.        Speech: Speech normal.        Behavior: Behavior normal.     ED Results / Procedures / Treatments   Labs (all labs ordered are listed, but only abnormal results are displayed) Labs Reviewed  COMPREHENSIVE METABOLIC PANEL - Abnormal; Notable for the following components:      Result Value   Potassium 3.4 (*)    Creatinine, Ser 1.09 (*)    GFR, Estimated 50 (*)    All other components within normal limits  URINALYSIS, W/ REFLEX TO CULTURE (INFECTION SUSPECTED) - Abnormal; Notable for the following components:   Color, Urine STRAW (*)    Specific Gravity, Urine 1.002 (*)    Glucose, UA >=500 (*)    All other components within normal limits  CULTURE, BLOOD (ROUTINE X 2)  CULTURE, BLOOD (ROUTINE X 2)  CBC WITH DIFFERENTIAL/PLATELET  PROTIME-INR  APTT  LIPASE, BLOOD  LACTIC ACID, PLASMA  LACTIC ACID, PLASMA  TROPONIN I (HIGH SENSITIVITY)    EKG EKG  Interpretation  Date/Time:  Saturday November 01 2022 05:02:53 EDT Ventricular Rate:  81 PR Interval:  48 QRS Duration: 91 QT Interval:  432 QTC Calculation: 508 R Axis:   10 Text Interpretation: Atrial-paced complexes Ventricular premature complex Borderline low voltage, extremity leads Abnormal R-wave progression, early transition Prolonged QT interval Confirmed by Orpah Greek [phone]) on 11/01/2022 5:06:19 AM  Radiology DG Pelvis Portable  Result Date: 11/01/2022 CLINICAL DATA:  Fall EXAM: PORTABLE PELVIS 1 VIEWS COMPARISON:  10/07/2022 FINDINGS: There is no evidence of pelvic fracture or diastasis. No pelvic bone lesions are seen. Generalized osteopenia. IMPRESSION: Negative. Electronically Signed   By: Jorje Guild M.D.   On: 11/01/2022 05:29   CT HEAD WO CONTRAST (5MM)  Result Date: 11/01/2022 CLINICAL DATA:  Head trauma, minor.  Unwitnessed fall. EXAM: CT HEAD WITHOUT CONTRAST CT CERVICAL SPINE WITHOUT CONTRAST TECHNIQUE: Multidetector CT imaging of the head and cervical spine was performed following the standard protocol without intravenous contrast. Multiplanar CT image reconstructions of the cervical spine were also generated. RADIATION DOSE REDUCTION: This exam was performed according to the departmental dose-optimization program which includes automated exposure control, adjustment of the mA and/or kV according to patient size and/or use of iterative reconstruction technique. COMPARISON:  Five days ago FINDINGS: CT HEAD FINDINGS Brain: No evidence of acute infarction, hemorrhage, hydrocephalus, extra-axial collection or mass lesion/mass effect. Chronic small vessel ischemia in the cerebral white matter with chronic perforator infarct at the left basal ganglia. Vascular: No hyperdense vessel or unexpected calcification. Skull: Normal. Negative for fracture or focal lesion. Sinuses/Orbits: No evidence of injury. CT CERVICAL SPINE FINDINGS Alignment: Normal. Skull base and  vertebrae: No acute fracture. No primary bone lesion or focal pathologic process. Soft tissues and spinal  canal: No prevertebral fluid or swelling. No visible canal hematoma. Disc levels:  Ordinary and generalized cervical spine degeneration Upper chest: Biapical scarring with no evidence of injury. IMPRESSION: No evidence of acute intracranial or cervical spine injury. Electronically Signed   By: Jorje Guild M.D.   On: 11/01/2022 05:28   CT CERVICAL SPINE WO CONTRAST  Result Date: 11/01/2022 CLINICAL DATA:  Head trauma, minor.  Unwitnessed fall. EXAM: CT HEAD WITHOUT CONTRAST CT CERVICAL SPINE WITHOUT CONTRAST TECHNIQUE: Multidetector CT imaging of the head and cervical spine was performed following the standard protocol without intravenous contrast. Multiplanar CT image reconstructions of the cervical spine were also generated. RADIATION DOSE REDUCTION: This exam was performed according to the departmental dose-optimization program which includes automated exposure control, adjustment of the mA and/or kV according to patient size and/or use of iterative reconstruction technique. COMPARISON:  Five days ago FINDINGS: CT HEAD FINDINGS Brain: No evidence of acute infarction, hemorrhage, hydrocephalus, extra-axial collection or mass lesion/mass effect. Chronic small vessel ischemia in the cerebral white matter with chronic perforator infarct at the left basal ganglia. Vascular: No hyperdense vessel or unexpected calcification. Skull: Normal. Negative for fracture or focal lesion. Sinuses/Orbits: No evidence of injury. CT CERVICAL SPINE FINDINGS Alignment: Normal. Skull base and vertebrae: No acute fracture. No primary bone lesion or focal pathologic process. Soft tissues and spinal canal: No prevertebral fluid or swelling. No visible canal hematoma. Disc levels:  Ordinary and generalized cervical spine degeneration Upper chest: Biapical scarring with no evidence of injury. IMPRESSION: No evidence of acute  intracranial or cervical spine injury. Electronically Signed   By: Jorje Guild M.D.   On: 11/01/2022 05:28   DG Chest Port 1 View  Result Date: 11/01/2022 CLINICAL DATA:  87 year old female with possible sepsis. EXAM: PORTABLE CHEST 1 VIEW COMPARISON:  Chest x-ray 10/27/2022. FINDINGS: Lung volumes are very low. Low opacity at the left base favored to reflect small left pleural effusion with associated passive subsegmental atelectasis in the left lower lobe. No definite consolidative airspace disease. No right pleural effusion. No pneumothorax. No evidence of pulmonary edema. Heart size is normal. The patient is severely rotated to the left on today's exam, resulting in distortion of the mediastinal contours and reduced diagnostic sensitivity and specificity for mediastinal pathology. Atherosclerotic calcifications in the thoracic aorta. Left-sided pacemaker device in place with lead tips projecting over the expected location of the right atrium and right ventricle. Soft tissue anchor in the right humeral head incidentally noted. IMPRESSION: 1. Low lung volumes with small left pleural effusion and probable subsegmental atelectasis in the left lung base. 2. Aortic atherosclerosis. Electronically Signed   By: Vinnie Langton M.D.   On: 11/01/2022 05:26    Procedures Procedures  {Document cardiac monitor, telemetry assessment procedure when appropriate:1}  Medications Ordered in ED Medications  iohexol (OMNIPAQUE) 300 MG/ML solution 100 mL (100 mLs Intravenous Contrast Given 11/01/22 0557)    ED Course/ Medical Decision Making/ A&P   {   Click here for ABCD2, HEART and other calculatorsREFRESH Note before signing :1}                          Medical Decision Making Amount and/or Complexity of Data Reviewed Labs: ordered. Radiology: ordered. ECG/medicine tests: ordered.  Risk Prescription drug management.   Patient presents to the emergency department for a fall.  It is unclear what  caused her to fall, she was unable to get back up.  First responders  came to her home and called EMS to bring her to the ED.  Patient complaining of some chest and abdominal pain.  It is unclear if this was present before the fall but she does have some generalized tenderness.  Chest and pelvic x-ray unremarkable.  Normal range of motion of the hips bilaterally.  Patient found to be mildly hypothermic at arrival.  I suspect that this was exposure after her fall, lab work is unremarkable.  No signs of infection.  No leukocytosis, no lactic acidosis.  Urinalysis clear, chest x-ray without pneumonia.  EKG shows atrial paced rhythm.  No ischemia, no infarct.  First troponin slightly elevated, but at her baseline.  Second troponin pending.  CT chest, abdomen, pelvis performed.  No injury noted, no other pathology to explain her pains.  She is now reporting that she has no pain.  She still is cold and having some shivering, will check TSH.  I do not believe there is any medical condition that requires hospitalization.  I am, however, concerned about her going home alone.  She will likely need social work consultation.    {Document critical care time when appropriate:1} {Document review of labs and clinical decision tools ie heart score, Chads2Vasc2 etc:1}  {Document your independent review of radiology images, and any outside records:1} {Document your discussion with family members, caretakers, and with consultants:1} {Document social determinants of health affecting pt's care:1} {Document your decision making why or why not admission, treatments were needed:1} Final Clinical Impression(s) / ED Diagnoses Final diagnoses:  None    Rx / DC Orders ED Discharge Orders     None

## 2022-11-01 NOTE — ED Triage Notes (Signed)
Pt arrived from home via [facility] s/p unwitnessed fall. Fire found pt on floor when call came out

## 2022-11-01 NOTE — ED Notes (Signed)
Patient drink 8 oz of water and 4 oz of apple sauce

## 2022-11-01 NOTE — ED Notes (Signed)
TOC consulted for SNF placement/home health.CSW awaiting PT recommendations. TOC to follow up.

## 2022-11-01 NOTE — ED Notes (Signed)
Family left for the night

## 2022-11-02 LAB — CULTURE, BLOOD (ROUTINE X 2)
Culture: NO GROWTH
Culture: NO GROWTH
Special Requests: ADEQUATE

## 2022-11-02 MED ORDER — ALPRAZOLAM 0.5 MG PO TABS
0.5000 mg | ORAL_TABLET | Freq: Once | ORAL | Status: DC
  Filled 2022-11-02: qty 1

## 2022-11-02 NOTE — ED Notes (Signed)
Pt's adopted granddaughter Judson Roch called to check on Pts status. This RN also spoke with April, Pts daughter/granddaughter, who reports she will try to reach out to Pts PCP Dr Nevada Crane to expedite [facility] arrangements for the Pt this week. April also reports she will be here to [facility] tomorrow during the early afternoon to pick the Pt up and bring her home. This RN did mention concern of an evolving abandonment issue regarding this Pt being left in [facility] due to family refusal to provide Pt transport home following the Pt being medically cleared for discharge home. April expressed concern to this RN she was worried Medicare would not financially support the Pt going to SNF post-discharge, and this is why they would like to trial Home Health. April did verbalize the Pt lives independently at home and agrees the Pt is alert and oriented but may be developing 'Sun-downing' in the evening which is contributing to her falling.

## 2022-11-02 NOTE — ED Provider Notes (Signed)
Emergency Medicine Observation Re-evaluation Note  MARISSIA GRATHWOHL is a 87 y.o. female, seen on rounds today.  Pt initially presented to the ED for complaints of Fall Currently, the patient is Sting quietly in bed.  Physical Exam  BP 100/87 (BP Location: Left Arm)   Pulse 74   Temp 97.9 F (36.6 C) (Oral)   Resp (!) 21   SpO2 97%  Physical Exam General: No acute distress Cardiac: Well-perfused Lungs: Nonlabored Psych: Cooperative  ED Course / MDM  EKG:EKG Interpretation  Date/Time:  Saturday November 01 2022 05:36:43 EDT Ventricular Rate:  85 PR Interval:  122 QRS Duration: 92 QT Interval:  442 QTC Calculation: 565 R Axis:   1 Text Interpretation: Atrial-paced complexes Abnormal R-wave progression, early transition Prolonged QT interval Confirmed by Garnette Gunner [phone]) on 11/01/2022 7:06:13 AM  I have reviewed the labs performed to date as well as medications administered while in observation.  Recent changes in the last 24 hours include medical evaluation and TOC consult.  Plan  Current plan is for PT consult and looking towards rehab.  12:30 PM.  Patient was seen by physical therapy and social work.  She was cleared for home PT and return to home.  She lives alone but granddaughter sounds like she is going to make sure she is getting meals and check on her.  2:30 PM.  I have heard through the nurse that she may have been accepted to a rehab.  Will hold on discharge for further information.   Hayden Rasmussen, MD 11/02/22 1452

## 2022-11-02 NOTE — ED Notes (Signed)
Pt up in recliner with call bell in reach.  Pt is talking on her cell phone.

## 2022-11-02 NOTE — ED Notes (Signed)
Pt again trying to get out of bed unassisted.  Pt is upset stating she has the right to go home & will call a cab if she has to in order to leave facility.  Pt redirected, in line of sight of nurse's station.  MD made aware.

## 2022-11-02 NOTE — ED Notes (Signed)
Pt trying to get up unassisted. Changed pt's bed linens & repositioned.  Pt is upset & wants to go home.  Redirected, in direct line of view from nurse's station.

## 2022-11-02 NOTE — ED Notes (Signed)
Pt spit out medication, stating "I'm not taking any more medications unless it's what Dr. Lovena Le gave me."

## 2022-11-02 NOTE — ED Notes (Addendum)
CSW updated that PT is recommending HH PT for pt at D/C. CSW met with pts granddaughter in ED who states that she would be agreeable to [facility] services. CSW requested that MD place orders for [facility]. CSW reaching out to agencies to find an accepting provider. Pts granddaughter asking about CAP[employer] program, CSW explained that she will need to reach out to the social services office to work on getting pt an Engineer, production. Caryl Pina with [facility] accepted pts HH referral. TOC signing off.

## 2022-11-02 NOTE — Evaluation (Signed)
Physical Therapy Evaluation Patient Details Name: [name] MRN: [id number] DOB: 07-Apr-1936 Today's Date: 11/02/2022  History of Present Illness  87 y.o. female with medical history significant of chronic respiratory failure on supplemental oxygen at 2 LPM at night, COPD, atrial fibrillation, CHF, suspected pancreatic cancer, tachycardia-bradycardia syndrome s/p pacemaker, CAD who presented to the emergency department.  Patient was unable to provide a history due to altered mental status, she was suspicious of staff wanting to kill her.  History was obtained from ED physician and ED medical record.  Per report, family noted that patient's BP was elevated, apparently she stopped taking her medications because she states that she saw her usual symptoms last night and told to stop taking all her meds and she told EMS team that she was ready to die.  Patient states that she lost her baby years ago on this date and was upset.  ED physician states that if called patient's granddaughter who told him that patient called and said that she wanted to come to die in her house.    Clinical Impression  Patient lying in bed but alert on therapist arrival.  She is very talkative and talks at length about her doctors, her medications, her family, her cancer diagnosis.  She performs bed mobility with min guard to min assist.  She is somewhat impulsive with her movements.  Patient is able to perform sit to stand from edge of bed with 1 HHA from PT. She initially tends to lean backwards but is able to recover with verbal and tactile cueing from the therapist.  Patient is able to stand and march in place 5 times each leg without loss of balance.  Patient able to get back into bed with min guard assist.  She is left in bed with call button in reach.  Nursing notified of mobility status. Patient will benefit from continued skilled therapy services during the remainder of her hospital stay and at the next recommended venue of  care to address deficits and promote return to optimal function.           Recommendations for follow up therapy are one component of a multi-disciplinary discharge planning process, led by the attending physician.  Recommendations may be updated based on patient status, additional functional criteria and insurance authorization.  Follow Up Recommendations Can patient physically be transported by private vehicle: Yes     Assistance Recommended at Discharge Intermittent Supervision/Assistance  Patient can return home with the following  A little help with walking and/or transfers;A little help with bathing/dressing/bathroom;Help with stairs or ramp for entrance    Equipment Recommendations None recommended by PT  Recommendations for Other Services       Functional Status Assessment Patient has had a recent decline in their functional status and demonstrates the ability to make significant improvements in function in a reasonable and predictable amount of time.     Precautions / Restrictions Precautions Precautions: Fall Restrictions Weight Bearing Restrictions: No      Mobility  Bed Mobility Overal bed mobility: Modified Independent, Needs Assistance Bed Mobility: Supine to Sit, Sit to Supine           General bed mobility comments: takes extra time; needs some assit with managing clothing; decreased safety awareness    Transfers Overall transfer level: Needs assistance Equipment used: 1 person hand held assist Transfers: Sit to/from Stand Sit to Stand: Min guard           General transfer comment: hand held  min guard, appropriate but delayed weight shifts, no major LOB. Slow, labored movements    Ambulation/Gait                  Stairs            Wheelchair Mobility    Modified Rankin (Stroke Patients Only)       Balance Overall balance assessment: Needs assistance Sitting-balance support: No upper extremity supported Sitting  balance-Leahy Scale: Fair Sitting balance - Comments: fair/fair posterior lean when MMT of BLE Postural control: Posterior lean Standing balance support: Single extremity supported Standing balance-Leahy Scale: Fair Standing balance comment: fair/fair requiring 1 HHA, delayed postural/anticipatory reactions.                             Pertinent Vitals/Pain Pain Assessment Pain Assessment: Faces Pain Score: 4  Faces Pain Scale: Hurts little more Pain Location: right side trunk and low back    Home Living Family/patient expects to be discharged to:: Private residence Living Arrangements: Alone Available Help at Discharge: Family;Friend(s) Type of Home: Mobile home Home Access: Level entry       Home Layout: One level Home Equipment: Cane - single point      Prior Function Prior Level of Function : History of Falls (last six months);Needs assist (meals on wheels; transportation)             Mobility Comments: uses cane sometimes; gets Meals on wheels and needs assist with transportation to appointments. ADLs Comments: Independent with all clothing, showering, cleaning, per daughter, pt's friend come provide meals     Hand Dominance   Dominant Hand: Right    Extremity/Trunk Assessment   Upper Extremity Assessment Upper Extremity Assessment: Generalized weakness    Lower Extremity Assessment Lower Extremity Assessment: Generalized weakness    Cervical / Trunk Assessment Cervical / Trunk Assessment: Kyphotic  Communication   Communication: No difficulties  Cognition Arousal/Alertness: Awake/alert Behavior During Therapy: WFL for tasks assessed/performed Overall Cognitive Status: Within Functional Limits for tasks assessed                                 General Comments: Hard of hearing        General Comments General comments (skin integrity, edema, etc.): noted bruising upper extremities    Exercises     Assessment/Plan     PT Assessment Patient needs continued PT services  PT Problem List Decreased strength;Decreased activity tolerance;Decreased balance;Decreased mobility;Decreased coordination       PT Treatment Interventions Neuromuscular re-education;Gait training;Functional mobility training;Therapeutic activities;Therapeutic exercise;Balance training    PT Goals (Current goals can be found in the Care Plan section)  Acute Rehab PT Goals Patient Stated Goal: "go home" PT Goal Formulation: With patient Time For Goal Achievement: 11/16/22 Potential to Achieve Goals: Good    Frequency Min 2X/week     Co-evaluation               AM-PAC PT "6 Clicks" Mobility  Outcome Measure Help needed turning from your back to your side while in a flat bed without using bedrails?: A Little Help needed moving from lying on your back to sitting on the side of a flat bed without using bedrails?: A Little Help needed moving to and from a bed to a chair (including a wheelchair)?: A Little Help needed standing up from a chair using your arms (e.g.,  wheelchair or bedside chair)?: A Little Help needed to walk in hospital room?: A Little Help needed climbing 3-5 steps with a railing? : A Lot 6 Click Score: 17    End of Session   Activity Tolerance: Patient tolerated treatment well Patient left: in bed;with call bell/phone within reach (in ED) Nurse Communication: Mobility status PT Visit Diagnosis: Unsteadiness on feet (R26.81);Other abnormalities of gait and mobility (R26.89);Muscle weakness (generalized) (M62.81)    Time: [id number] PT Time Calculation (min) (ACUTE ONLY): 25 min   Charges:   PT Evaluation $PT Eval Low Complexity: 1 Low          12:15 PM, 11/02/22 Tika Hannis Small Karaline Buresh MPT [place] [place] [phone] [phone]

## 2022-11-02 NOTE — ED Notes (Signed)
Pt resting in bed with eyes closed.

## 2022-11-02 NOTE — ED Provider Notes (Signed)
Patient was apparently cleared by [facility] and will do home health instead.  Was not discharged earlier.  Will discharge now though daughter states she cannot pick her up until tomorrow morning. Home health has been ordered.   Sherwood Gambler, MD 11/02/22 [phone]

## 2022-11-03 ENCOUNTER — Ambulatory Visit: Payer: Self-pay | Admitting: *Deleted

## 2022-11-03 DIAGNOSIS — E860 Dehydration: Secondary | ICD-10-CM | POA: Diagnosis not present

## 2022-11-03 DIAGNOSIS — I4819 Other persistent atrial fibrillation: Secondary | ICD-10-CM | POA: Diagnosis not present

## 2022-11-03 DIAGNOSIS — I160 Hypertensive urgency: Secondary | ICD-10-CM | POA: Diagnosis not present

## 2022-11-03 DIAGNOSIS — F22 Delusional disorders: Secondary | ICD-10-CM | POA: Diagnosis not present

## 2022-11-03 DIAGNOSIS — I10 Essential (primary) hypertension: Secondary | ICD-10-CM | POA: Diagnosis not present

## 2022-11-03 DIAGNOSIS — W19XXXD Unspecified fall, subsequent encounter: Secondary | ICD-10-CM | POA: Diagnosis not present

## 2022-11-03 DIAGNOSIS — R296 Repeated falls: Secondary | ICD-10-CM | POA: Diagnosis not present

## 2022-11-03 DIAGNOSIS — J9611 Chronic respiratory failure with hypoxia: Secondary | ICD-10-CM | POA: Diagnosis not present

## 2022-11-03 DIAGNOSIS — I499 Cardiac arrhythmia, unspecified: Secondary | ICD-10-CM | POA: Diagnosis not present

## 2022-11-03 DIAGNOSIS — I482 Chronic atrial fibrillation, unspecified: Secondary | ICD-10-CM | POA: Diagnosis not present

## 2022-11-03 DIAGNOSIS — R531 Weakness: Secondary | ICD-10-CM | POA: Diagnosis not present

## 2022-11-03 DIAGNOSIS — E876 Hypokalemia: Secondary | ICD-10-CM | POA: Diagnosis not present

## 2022-11-03 DIAGNOSIS — R627 Adult failure to thrive: Secondary | ICD-10-CM | POA: Diagnosis not present

## 2022-11-03 DIAGNOSIS — Z743 Need for continuous supervision: Secondary | ICD-10-CM | POA: Diagnosis not present

## 2022-11-03 DIAGNOSIS — Z8249 Family history of ischemic heart disease and other diseases of the circulatory system: Secondary | ICD-10-CM | POA: Diagnosis not present

## 2022-11-03 DIAGNOSIS — K862 Cyst of pancreas: Secondary | ICD-10-CM | POA: Diagnosis not present

## 2022-11-03 DIAGNOSIS — R404 Transient alteration of awareness: Secondary | ICD-10-CM | POA: Diagnosis not present

## 2022-11-03 DIAGNOSIS — I5022 Chronic systolic (congestive) heart failure: Secondary | ICD-10-CM | POA: Diagnosis not present

## 2022-11-03 DIAGNOSIS — I6523 Occlusion and stenosis of bilateral carotid arteries: Secondary | ICD-10-CM | POA: Diagnosis not present

## 2022-11-03 DIAGNOSIS — G928 Other toxic encephalopathy: Secondary | ICD-10-CM | POA: Diagnosis not present

## 2022-11-03 DIAGNOSIS — Z66 Do not resuscitate: Secondary | ICD-10-CM | POA: Diagnosis not present

## 2022-11-03 DIAGNOSIS — I2510 Atherosclerotic heart disease of native coronary artery without angina pectoris: Secondary | ICD-10-CM | POA: Diagnosis not present

## 2022-11-03 DIAGNOSIS — I495 Sick sinus syndrome: Secondary | ICD-10-CM | POA: Diagnosis not present

## 2022-11-03 DIAGNOSIS — J449 Chronic obstructive pulmonary disease, unspecified: Secondary | ICD-10-CM | POA: Diagnosis not present

## 2022-11-03 DIAGNOSIS — T82855A Stenosis of coronary artery stent, initial encounter: Secondary | ICD-10-CM | POA: Diagnosis not present

## 2022-11-03 DIAGNOSIS — M47812 Spondylosis without myelopathy or radiculopathy, cervical region: Secondary | ICD-10-CM | POA: Diagnosis not present

## 2022-11-03 DIAGNOSIS — G9341 Metabolic encephalopathy: Secondary | ICD-10-CM | POA: Diagnosis not present

## 2022-11-03 DIAGNOSIS — K8689 Other specified diseases of pancreas: Secondary | ICD-10-CM | POA: Diagnosis not present

## 2022-11-03 DIAGNOSIS — E785 Hyperlipidemia, unspecified: Secondary | ICD-10-CM | POA: Diagnosis not present

## 2022-11-03 DIAGNOSIS — R6889 Other general symptoms and signs: Secondary | ICD-10-CM | POA: Diagnosis not present

## 2022-11-03 DIAGNOSIS — R55 Syncope and collapse: Secondary | ICD-10-CM | POA: Diagnosis not present

## 2022-11-03 DIAGNOSIS — Z79899 Other long term (current) drug therapy: Secondary | ICD-10-CM | POA: Diagnosis not present

## 2022-11-03 DIAGNOSIS — I480 Paroxysmal atrial fibrillation: Secondary | ICD-10-CM | POA: Diagnosis not present

## 2022-11-03 DIAGNOSIS — E782 Mixed hyperlipidemia: Secondary | ICD-10-CM | POA: Diagnosis not present

## 2022-11-03 DIAGNOSIS — R410 Disorientation, unspecified: Secondary | ICD-10-CM | POA: Diagnosis not present

## 2022-11-03 DIAGNOSIS — R29818 Other symptoms and signs involving the nervous system: Secondary | ICD-10-CM | POA: Diagnosis not present

## 2022-11-03 DIAGNOSIS — I5032 Chronic diastolic (congestive) heart failure: Secondary | ICD-10-CM | POA: Diagnosis not present

## 2022-11-03 DIAGNOSIS — I6503 Occlusion and stenosis of bilateral vertebral arteries: Secondary | ICD-10-CM | POA: Diagnosis not present

## 2022-11-03 DIAGNOSIS — I110 Hypertensive heart disease with heart failure: Secondary | ICD-10-CM | POA: Diagnosis not present

## 2022-11-03 DIAGNOSIS — E872 Acidosis, unspecified: Secondary | ICD-10-CM | POA: Diagnosis not present

## 2022-11-03 DIAGNOSIS — Z87891 Personal history of nicotine dependence: Secondary | ICD-10-CM | POA: Diagnosis not present

## 2022-11-03 MED ORDER — ALPRAZOLAM 0.5 MG PO TABS
0.5000 mg | ORAL_TABLET | Freq: Once | ORAL | Status: AC
  Administered 2022-11-03: 0.5 mg via ORAL
  Filled 2022-11-03: qty 1

## 2022-11-03 NOTE — Patient Outreach (Signed)
Care Coordination   Follow Up Visit Note   03/13/2023 Late update entry for 11/03/22 Name: [name] MRN: [id number] DOB: 12/20/1935  [name] is a 87 y.o. year old female who sees [name], Katrina Hazel, MD for primary care. I spoke with  [name] by phone today.  What matters to the patients health and wellness today?  [name] and life alert was alerted to call EMS on 11/01/22 0400 Patient taken to ED.   To see pcp on 11/04/22 per [name] who scheduled an appointment. [name] confirms [name] will be taken to the 11/04/22 appointment by friend [name] Officers are checking the patient's home perimeter per [name]  [name] is encouraging more assistance from patient's daughter.  Her son has cancer with very limited assistance Center well scheduled to visit per [name]   Patient medicaid is used to pay her medicare premium  Patient has hearing aides but remains hard of hearing   Agrees to have [facility] SW to assist with level of care, safety and other community resources      Goals Addressed             This Visit's Progress    [facility] care coordination services   Not on track    Interventions Today    Flowsheet Row Most Recent Value  Chronic Disease   Chronic disease during today's visit Other  [Fall, pcp follow up, home safety, home support, SW services]  General Interventions   General Interventions Discussed/Reviewed General Interventions Reviewed, Level of Care, Doctor Visits, Community Resources  Doctor Visits Discussed/Reviewed Doctor Visits Reviewed, PCP  PCP/Specialist Visits Compliance with follow-up visit  Communication with Social Work  Level of Care Adult Daycare, Skilled Nursing Facility, Development worker, international aid, Assisted Living  Education Interventions   Education Provided Provided Education  [employer] SW services/Referral]  Provided Verbal Education On [employer], Mental Health/Coping with Illness  Mental Health Interventions   Mental Health Discussed/Reviewed Mental Health  Reviewed, Coping Strategies, Refer to Social Work for resources  Refer to Social Work for resources regarding Adult Daycare, Assisted Living/Skilled Nursing Facility, Other  [? facility placement]  Pharmacy Interventions   Pharmacy Dicussed/Reviewed Pharmacy Topics Reviewed  Safety Interventions   Safety Discussed/Reviewed Safety Reviewed, Community education officer, Refer for community resources  Advanced Directive Interventions   Advanced Directives Discussed/Reviewed Advanced Directives Discussed, Advanced Care Planning, Guardianship  Guardianship Provide resources           COMPLETED: [facility]--Make and Keep All Appointments       Timeframe:  Short-Term Goal Priority:  Medium Start Date:    03/25/2021                         Expected End Date:  08/02/2021  Follow Up Date: 07/05/2021  Barriers: Health Behaviors Knowledge                        - ask family or friend for a ride - call to cancel if needed - keep a calendar with appointment dates    Why is this important?   Part of staying healthy is seeing the doctor for follow-up care.  If you forget your appointments, there are some things you can do to stay on track.    Notes:  11/2- Recent procedure and pt continue to attend all follow up appointments with the assistance from her niece [name]. RN able to verify Niece (  [name]) will be providing pt transportation services to her cardiologist on tomorrow. 9/28-Reports caregiver [name] (niece) continue to help within the home with all ADL and provides transportation to all her medical appointments.No missed appointments. Will continue to monitor due to her recent medical issues.  8/22- Discussed with daughter [name] all pending appointments post d/c orders and verified pt has sufficient transportation to all her pending appointments. Pt's niece [name] is also a caregiver and assist the pt with her pill box refills and transporting pt to all her medical appointments. In  addition to running needed errands.  CASE CLOSURE DUE TO EXTERNAL CASE MANAGEMENT PROGRAM     COMPLETED: [facility]--[place] My Quality of Life       Nurse care plan type changed for year 2024 11/03/22 Closing this Goal - Duplicate goal information for 2024  L. Noelle Penner, RN, BSN, CCM [facility] Care Management Community Coordinator Office number [phone]      Timeframe:  Long-Range Goal Priority:  Medium Start Date:  03/25/2021                           Expected End Date:    10/01/2020                   Follow Up Date: 07/05/2021    - complete a living will - do one enjoyable thing every day - make shared treatment decisions with doctor - name a health care proxy (decision maker) - spend time outdoors at least 3 times a week - strengthen or fix relationships with loved ones  Barriers: Health Behaviors Knowledge    Why is this important?   Having a long-term illness can be scary.  It can also be stressful for you and your caregiver.  These steps may help.    Notes:  11/2-Reports she received the A.D and continues to completed with with family involvement. Continues to work on the relationship with her family ([name] and [name]). 8/22-Discussed healthy habits to improve quality of life and all goals with interventions noted above. Stress the importance of seek medical attention when needed with any acute symptoms of encounters. CASE CLOSURE DUE TO EXTERNAL CASE MANAGEMENT PROGRAM        SDOH assessments and interventions completed:  No     Care Coordination Interventions:  Yes, provided   Follow up plan: Follow up call scheduled for 11/10/22    Encounter Outcome:  Pt. Visit Completed    L. Noelle Penner, RN, BSN, CCM [facility] Care Management Community Coordinator Office number [phone]

## 2022-11-04 DIAGNOSIS — I480 Paroxysmal atrial fibrillation: Secondary | ICD-10-CM | POA: Diagnosis not present

## 2022-11-04 DIAGNOSIS — J9611 Chronic respiratory failure with hypoxia: Secondary | ICD-10-CM | POA: Diagnosis not present

## 2022-11-04 DIAGNOSIS — K8689 Other specified diseases of pancreas: Secondary | ICD-10-CM | POA: Diagnosis not present

## 2022-11-04 DIAGNOSIS — J449 Chronic obstructive pulmonary disease, unspecified: Secondary | ICD-10-CM | POA: Diagnosis not present

## 2022-11-04 DIAGNOSIS — I110 Hypertensive heart disease with heart failure: Secondary | ICD-10-CM | POA: Diagnosis not present

## 2022-11-04 DIAGNOSIS — W19XXXD Unspecified fall, subsequent encounter: Secondary | ICD-10-CM | POA: Diagnosis not present

## 2022-11-04 DIAGNOSIS — E782 Mixed hyperlipidemia: Secondary | ICD-10-CM | POA: Diagnosis not present

## 2022-11-04 DIAGNOSIS — E876 Hypokalemia: Secondary | ICD-10-CM | POA: Diagnosis not present

## 2022-11-04 DIAGNOSIS — I5022 Chronic systolic (congestive) heart failure: Secondary | ICD-10-CM | POA: Diagnosis not present

## 2022-11-05 ENCOUNTER — Encounter: Payer: Self-pay | Admitting: *Deleted

## 2022-11-05 ENCOUNTER — Emergency Department (HOSPITAL_COMMUNITY): Payer: 59

## 2022-11-05 ENCOUNTER — Encounter (HOSPITAL_COMMUNITY): Payer: Self-pay | Admitting: Emergency Medicine

## 2022-11-05 ENCOUNTER — Inpatient Hospital Stay (HOSPITAL_COMMUNITY)
Admission: EM | Admit: 2022-11-05 | Discharge: 2022-11-12 | DRG: 071 | Disposition: A | Discharge status: Skilled Nursing Facility | Payer: 59 | Attending: Internal Medicine | Admitting: Internal Medicine

## 2022-11-05 ENCOUNTER — Other Ambulatory Visit: Payer: Self-pay

## 2022-11-05 ENCOUNTER — Ambulatory Visit: Payer: Self-pay | Admitting: *Deleted

## 2022-11-05 DIAGNOSIS — Z91148 Patient's other noncompliance with medication regimen for other reason: Secondary | ICD-10-CM

## 2022-11-05 DIAGNOSIS — G928 Other toxic encephalopathy: Secondary | ICD-10-CM | POA: Diagnosis not present

## 2022-11-05 DIAGNOSIS — Z9049 Acquired absence of other specified parts of digestive tract: Secondary | ICD-10-CM

## 2022-11-05 DIAGNOSIS — Z950 Presence of cardiac pacemaker: Secondary | ICD-10-CM

## 2022-11-05 DIAGNOSIS — R531 Weakness: Secondary | ICD-10-CM | POA: Diagnosis not present

## 2022-11-05 DIAGNOSIS — W1830XA Fall on same level, unspecified, initial encounter: Secondary | ICD-10-CM | POA: Diagnosis present

## 2022-11-05 DIAGNOSIS — F419 Anxiety disorder, unspecified: Secondary | ICD-10-CM | POA: Diagnosis present

## 2022-11-05 DIAGNOSIS — I5032 Chronic diastolic (congestive) heart failure: Secondary | ICD-10-CM | POA: Diagnosis present

## 2022-11-05 DIAGNOSIS — K219 Gastro-esophageal reflux disease without esophagitis: Secondary | ICD-10-CM | POA: Diagnosis present

## 2022-11-05 DIAGNOSIS — I6503 Occlusion and stenosis of bilateral vertebral arteries: Secondary | ICD-10-CM | POA: Diagnosis not present

## 2022-11-05 DIAGNOSIS — R296 Repeated falls: Secondary | ICD-10-CM | POA: Diagnosis not present

## 2022-11-05 DIAGNOSIS — I2510 Atherosclerotic heart disease of native coronary artery without angina pectoris: Secondary | ICD-10-CM | POA: Diagnosis not present

## 2022-11-05 DIAGNOSIS — Z825 Family history of asthma and other chronic lower respiratory diseases: Secondary | ICD-10-CM

## 2022-11-05 DIAGNOSIS — Z90710 Acquired absence of both cervix and uterus: Secondary | ICD-10-CM

## 2022-11-05 DIAGNOSIS — M549 Dorsalgia, unspecified: Secondary | ICD-10-CM | POA: Diagnosis present

## 2022-11-05 DIAGNOSIS — E876 Hypokalemia: Secondary | ICD-10-CM | POA: Diagnosis present

## 2022-11-05 DIAGNOSIS — K862 Cyst of pancreas: Secondary | ICD-10-CM | POA: Diagnosis present

## 2022-11-05 DIAGNOSIS — Z8701 Personal history of pneumonia (recurrent): Secondary | ICD-10-CM

## 2022-11-05 DIAGNOSIS — F22 Delusional disorders: Secondary | ICD-10-CM | POA: Diagnosis present

## 2022-11-05 DIAGNOSIS — I160 Hypertensive urgency: Secondary | ICD-10-CM | POA: Diagnosis present

## 2022-11-05 DIAGNOSIS — R410 Disorientation, unspecified: Secondary | ICD-10-CM | POA: Diagnosis not present

## 2022-11-05 DIAGNOSIS — I10 Essential (primary) hypertension: Secondary | ICD-10-CM | POA: Diagnosis not present

## 2022-11-05 DIAGNOSIS — Z882 Allergy status to sulfonamides status: Secondary | ICD-10-CM

## 2022-11-05 DIAGNOSIS — Z66 Do not resuscitate: Secondary | ICD-10-CM | POA: Diagnosis present

## 2022-11-05 DIAGNOSIS — Z955 Presence of coronary angioplasty implant and graft: Secondary | ICD-10-CM

## 2022-11-05 DIAGNOSIS — E872 Acidosis, unspecified: Secondary | ICD-10-CM | POA: Diagnosis not present

## 2022-11-05 DIAGNOSIS — Z79899 Other long term (current) drug therapy: Secondary | ICD-10-CM

## 2022-11-05 DIAGNOSIS — R627 Adult failure to thrive: Secondary | ICD-10-CM | POA: Diagnosis present

## 2022-11-05 DIAGNOSIS — R6889 Other general symptoms and signs: Secondary | ICD-10-CM | POA: Diagnosis not present

## 2022-11-05 DIAGNOSIS — Z8249 Family history of ischemic heart disease and other diseases of the circulatory system: Secondary | ICD-10-CM

## 2022-11-05 DIAGNOSIS — I495 Sick sinus syndrome: Secondary | ICD-10-CM | POA: Diagnosis present

## 2022-11-05 DIAGNOSIS — I4819 Other persistent atrial fibrillation: Secondary | ICD-10-CM | POA: Diagnosis present

## 2022-11-05 DIAGNOSIS — I6523 Occlusion and stenosis of bilateral carotid arteries: Secondary | ICD-10-CM | POA: Diagnosis not present

## 2022-11-05 DIAGNOSIS — G8929 Other chronic pain: Secondary | ICD-10-CM | POA: Diagnosis present

## 2022-11-05 DIAGNOSIS — Z7984 Long term (current) use of oral hypoglycemic drugs: Secondary | ICD-10-CM

## 2022-11-05 DIAGNOSIS — I499 Cardiac arrhythmia, unspecified: Secondary | ICD-10-CM | POA: Diagnosis not present

## 2022-11-05 DIAGNOSIS — Y831 Surgical operation with implant of artificial internal device as the cause of abnormal reaction of the patient, or of later complication, without mention of misadventure at the time of the procedure: Secondary | ICD-10-CM | POA: Diagnosis present

## 2022-11-05 DIAGNOSIS — J449 Chronic obstructive pulmonary disease, unspecified: Secondary | ICD-10-CM | POA: Diagnosis present

## 2022-11-05 DIAGNOSIS — R29818 Other symptoms and signs involving the nervous system: Secondary | ICD-10-CM | POA: Diagnosis not present

## 2022-11-05 DIAGNOSIS — J9611 Chronic respiratory failure with hypoxia: Secondary | ICD-10-CM | POA: Diagnosis present

## 2022-11-05 DIAGNOSIS — Z7982 Long term (current) use of aspirin: Secondary | ICD-10-CM

## 2022-11-05 DIAGNOSIS — Z87891 Personal history of nicotine dependence: Secondary | ICD-10-CM

## 2022-11-05 DIAGNOSIS — R29810 Facial weakness: Secondary | ICD-10-CM | POA: Diagnosis present

## 2022-11-05 DIAGNOSIS — R4189 Other symptoms and signs involving cognitive functions and awareness: Secondary | ICD-10-CM | POA: Diagnosis present

## 2022-11-05 DIAGNOSIS — I482 Chronic atrial fibrillation, unspecified: Secondary | ICD-10-CM | POA: Diagnosis present

## 2022-11-05 DIAGNOSIS — E860 Dehydration: Secondary | ICD-10-CM | POA: Diagnosis present

## 2022-11-05 DIAGNOSIS — M47812 Spondylosis without myelopathy or radiculopathy, cervical region: Secondary | ICD-10-CM | POA: Diagnosis not present

## 2022-11-05 DIAGNOSIS — E785 Hyperlipidemia, unspecified: Secondary | ICD-10-CM | POA: Diagnosis present

## 2022-11-05 DIAGNOSIS — T82855A Stenosis of coronary artery stent, initial encounter: Secondary | ICD-10-CM | POA: Diagnosis present

## 2022-11-05 DIAGNOSIS — G9341 Metabolic encephalopathy: Secondary | ICD-10-CM | POA: Diagnosis not present

## 2022-11-05 DIAGNOSIS — R404 Transient alteration of awareness: Secondary | ICD-10-CM | POA: Diagnosis not present

## 2022-11-05 DIAGNOSIS — I110 Hypertensive heart disease with heart failure: Secondary | ICD-10-CM | POA: Diagnosis present

## 2022-11-05 DIAGNOSIS — Z888 Allergy status to other drugs, medicaments and biological substances status: Secondary | ICD-10-CM

## 2022-11-05 DIAGNOSIS — Z743 Need for continuous supervision: Secondary | ICD-10-CM | POA: Diagnosis not present

## 2022-11-05 LAB — COMPREHENSIVE METABOLIC PANEL
ALT: 23 U/L (ref 0–44)
AST: 33 U/L (ref 15–41)
Albumin: 3.9 g/dL (ref 3.5–5.0)
Alkaline Phosphatase: 80 U/L (ref 38–126)
Anion gap: 11 (ref 5–15)
BUN: 15 mg/dL (ref 8–23)
CO2: 23 mmol/L (ref 22–32)
Calcium: 9.5 mg/dL (ref 8.9–10.3)
Chloride: 103 mmol/L (ref 98–111)
Creatinine, Ser: 0.84 mg/dL (ref 0.44–1.00)
GFR, Estimated: >60 mL/min (ref >60)
Glucose, Bld: 98 mg/dL (ref 70–99)
Potassium: 3.2 mmol/L — ABNORMAL LOW (ref 3.5–5.1)
Sodium: 137 mmol/L (ref 135–145)
Total Bilirubin: 1.1 mg/dL (ref 0.3–1.2)
Total Protein: 7.6 g/dL (ref 6.5–8.1)

## 2022-11-05 LAB — RAPID URINE DRUG SCREEN, HOSP PERFORMED
Amphetamines: NOT DETECTED
Barbiturates: NOT DETECTED
Benzodiazepines: POSITIVE — AB
Cocaine: NOT DETECTED
Opiates: NOT DETECTED
Tetrahydrocannabinol: NOT DETECTED

## 2022-11-05 LAB — CBC WITH DIFFERENTIAL/PLATELET
Abs Immature Granulocytes: 0.03 10*3/uL (ref 0.00–0.07)
Basophils Absolute: 0 10*3/uL (ref 0.0–0.1)
Basophils Relative: 1 %
Eosinophils Absolute: 0 10*3/uL (ref 0.0–0.5)
Eosinophils Relative: 0 %
HCT: 40.3 % (ref 36.0–46.0)
Hemoglobin: 13.1 g/dL (ref 12.0–15.0)
Immature Granulocytes: 0 %
Lymphocytes Relative: 18 %
Lymphs Abs: 1.5 10*3/uL (ref 0.7–4.0)
MCH: 30.2 pg (ref 26.0–34.0)
MCHC: 32.5 g/dL (ref 30.0–36.0)
MCV: 92.9 fL (ref 80.0–100.0)
Monocytes Absolute: 1.1 10*3/uL — ABNORMAL HIGH (ref 0.1–1.0)
Monocytes Relative: 13 %
Neutro Abs: 5.9 10*3/uL (ref 1.7–7.7)
Neutrophils Relative %: 68 %
Platelets: 208 10*3/uL (ref 150–400)
RBC: 4.34 MIL/uL (ref 3.87–5.11)
RDW: 14.6 % (ref 11.5–15.5)
WBC: 8.6 10*3/uL (ref 4.0–10.5)
nRBC: 0 % (ref 0.0–0.2)

## 2022-11-05 LAB — I-STAT CHEM 8, ED
BUN: 14 mg/dL (ref 8–23)
Calcium, Ion: 1.11 mmol/L — ABNORMAL LOW (ref 1.15–1.40)
Chloride: 105 mmol/L (ref 98–111)
Creatinine, Ser: 0.7 mg/dL (ref 0.44–1.00)
Glucose, Bld: 98 mg/dL (ref 70–99)
HCT: 40 % (ref 36.0–46.0)
Hemoglobin: 13.6 g/dL (ref 12.0–15.0)
Potassium: 3.4 mmol/L — ABNORMAL LOW (ref 3.5–5.1)
Sodium: 140 mmol/L (ref 135–145)
TCO2: 21 mmol/L — ABNORMAL LOW (ref 22–32)

## 2022-11-05 LAB — URINALYSIS, ROUTINE W REFLEX MICROSCOPIC
Bacteria, UA: NONE SEEN
Bilirubin Urine: NEGATIVE
Glucose, UA: >=500 mg/dL — AB
Hgb urine dipstick: NEGATIVE
Ketones, ur: 5 mg/dL — AB
Nitrite: NEGATIVE
Protein, ur: NEGATIVE mg/dL
Specific Gravity, Urine: 1.023 (ref 1.005–1.030)
pH: 8 (ref 5.0–8.0)

## 2022-11-05 LAB — PROTIME-INR
INR: 1.1 (ref 0.8–1.2)
Prothrombin Time: 13.7 seconds (ref 11.4–15.2)

## 2022-11-05 LAB — LACTIC ACID, PLASMA
Lactic Acid, Venous: 2.6 mmol/L (ref 0.5–1.9)
Lactic Acid, Venous: 1.7 mmol/L (ref 0.5–1.9)

## 2022-11-05 LAB — AMMONIA: Ammonia: 19 umol/L (ref 9–35)

## 2022-11-05 LAB — ETHANOL: Alcohol, Ethyl (B): <10 mg/dL (ref <10)

## 2022-11-05 LAB — TSH: TSH: 1.218 u[IU]/mL (ref 0.350–4.500)

## 2022-11-05 LAB — APTT: aPTT: 26 seconds (ref 24–36)

## 2022-11-05 MED ORDER — IOHEXOL 350 MG/ML SOLN
100.0000 mL | Freq: Once | INTRAVENOUS | Status: AC | PRN
  Administered 2022-11-05: 100 mL via INTRAVENOUS

## 2022-11-05 MED ORDER — LORAZEPAM 2 MG/ML IJ SOLN
1.0000 mg | Freq: Once | INTRAMUSCULAR | Status: AC
  Administered 2022-11-05: 1 mg via INTRAVENOUS

## 2022-11-05 MED ORDER — LORAZEPAM 2 MG/ML IJ SOLN
INTRAMUSCULAR | Status: AC
  Filled 2022-11-05: qty 1

## 2022-11-05 MED ORDER — QUETIAPINE FUMARATE 25 MG PO TABS
50.0000 mg | ORAL_TABLET | Freq: Once | ORAL | Status: AC
  Administered 2022-11-05: 50 mg via ORAL
  Filled 2022-11-05: qty 2

## 2022-11-05 MED ORDER — LACTATED RINGERS IV BOLUS
1000.0000 mL | Freq: Once | INTRAVENOUS | Status: AC
  Administered 2022-11-05: 1000 mL via INTRAVENOUS

## 2022-11-05 NOTE — Patient Outreach (Signed)
Care Coordination   Initial Visit Note   11/05/2022  Name: [name] MRN: [id number] DOB: 1936-06-15  [name] is a 87 y.o. year old female who sees Nevada Crane, Edwinna Areola, MD for primary care. I spoke with granddaughter, [name] by phone today.  What matters to the patients health and wellness today? Assist with [place].   Goals Addressed             This Visit's Progress    Assist with [place].   On track    Care Coordination Interventions:  Interventions Today    Flowsheet Row Most Recent Value  Chronic Disease   Chronic disease during today's visit Other  [Generalized Anxiety Disorder, Caregiver Stress/Burnout]  General Interventions   General Interventions Discussed/Reviewed General Interventions Discussed, Labs, Vaccines, Doctor Visits, Health Screening, General Interventions Reviewed, Annual Eye Exam, Durable Medical Equipment (DME), Community Resources, Level of Care, Communication with  [Communication with Primary Care Provider]  Vaccines COVID-19, Flu, Pneumonia, RSV, Shingles, Tetanus/Pertussis/Diphtheria  [Encouraged]  Doctor Visits Discussed/Reviewed Doctor Visits Discussed, Specialist, Doctor Visits Reviewed, Annual Wellness Visits, PCP  [Encouraged]  Health Screening Bone Density, Colonoscopy, Mammogram  [Encouraged]  Durable Medical Equipment (DME) Shower bench, BP Cuff, Walker  PCP/Specialist Visits Compliance with follow-up visit  [Encouraged]  Communication with PCP/Specialists, RN  Level of Care Adult Daycare, Personal Care Services, Applications, Assisted Living, [place]  [Encouraged]  Applications Medicaid, Personal Care Services, [place]-2  Exercise Interventions   Exercise Discussed/Reviewed Exercise Discussed, Assistive device use and maintanence, Exercise Reviewed, Physical Activity, Weight Managment  [Encouraged]  Physical Activity Discussed/Reviewed Physical Activity Discussed, Home  Exercise Program (HEP), Physical Activity Reviewed, Types of exercise  [Encouraged]  Weight Management Weight maintenance  [Encouraged]  Education Interventions   Education Provided Provided Engineer, site, Provided Web-based Education, Provided Education  Provided Verbal Education On Nutrition, Mental Health/Coping with Illness, When to see the doctor, [employer], [employer], Medication, Exercise, Applications, Foot Care, Eye Care, Labs  [Encouraged]  Applications Medicaid, Personal Care Services, [place] Discussed, Anxiety, Depression, Grief and Loss, Substance Abuse, Suicide, Other, Mental Health Reviewed, Coping Strategies, Crisis  [Domestic Violence]  Nutrition Interventions   Nutrition Discussed/Reviewed Nutrition Discussed, Adding fruits and vegetables, Increaing proteins, Decreasing fats, Decreasing salt, Supplmental nutrition, Decreasing sugar intake, Carbohydrate meal planning, Nutrition Reviewed, Fluid intake, Portion sizes  [Encouraged]  Pharmacy Interventions   Pharmacy Dicussed/Reviewed Pharmacy Topics Discussed, Pharmacy Topics Reviewed, Medications and their functions, Medication Adherence, Affording Medications  [Encouraged]  Safety Interventions   Safety Discussed/Reviewed Safety Discussed, Safety Reviewed, Fall Risk, Home Safety  [Encouraged]  Home Safety Assistive Devices, Need for home safety assessment, Refer for community resources  Advanced Directive Interventions   Advanced Directives Discussed/Reviewed Advanced Directives Discussed, Advanced Directives Reviewed     Assessed Social Determinant of Health Barriers. Discussed Plans for Ongoing Care Management Follow Up. Provided Tree surgeon Information for Care Management Team Members. Screened for Signs & Symptoms of Depression, Related to Chronic Disease State.  PHQ2 & PHQ9 Depression Screen Completed & Results Reviewed.   Suicidal Ideation & Homicidal Ideation Assessed - None Present.   Domestic Violence Assessed - None Present. Access to Weapons Assessed - None Present.   Active Listening & Reflection Utilized.  Verbalization of Feelings Encouraged.  Emotional Support Provided. Feelings of Caregiver Burnout Validated. Caregiver Stress Acknowledged. Caregiver Resources Reviewed. Caregiver Support Groups Mailed. Self-Enrollment in Caregiver Support Group of  Interest Emphasized. Crisis Support Information, Agencies, Services & Resources Discussed. Problem Solving Interventions Identified. Task-Centered Solutions Implemented.   Solution-Focused Strategies Developed. Acceptance & Commitment Therapy Introduced. Brief Cognitive Behavioral Therapy Initiated. Client-Centered Therapy Enacted. Reviewed Prescription Medications & Discussed Importance of Compliance. Quality of Sleep Assessed & Sleep Hygiene Techniques Promoted. CSW Collaboration with Granddaughter, [name] to Discuss Higher Level of Care Options (I.e. [place], [place]) & Encourage Consideration. CSW Collaboration with Granddaughter, [name] to [employer] [place]., Covered Under Teaching laboratory technician through [employer].  CSW Collaboration with Granddaughter, [name] to Verify No Long-Term [employer], [employer], Plans, Coverage, Etc.  CSW Collaboration with Granddaughter, [name] to Confirm Patient, Nor Deceased Husband Were Veterans, Making Patient Ineligible to Apply for Aid & Attendance Benefits, Through [employer]. CSW Collaboration with Granddaughter, [name] to Request Review of The Following List of Express Scripts, [employer], [employer], Mailed on 11/05/2022: ~ Adult Day Care  Programs  ~ [place] ~ [place] ~ [place] ~ [place] Application  ~ [place] Providers ~ 2023 Medicaid Tips ~ [employer] [place] with Granddaughter, [name] to Environmental health practitioner with [employer] Scripts, [place], in An Effort to [place] for Patient in [place]. CSW Collaboration with Granddaughter, [name] to Encourage Completion of Application for [employer], through [employer] [phone]), Once Patient is Approved for Full Medicaid Coverage & Benefits, through [place] [phone]). CSW Collaboration with Granddaughter, [name] to Confirm Patient's Disinterest in Receiving [place], through [facility] [phone]). CSW Collaboration with Primary Care Provider, Dr. Allyn Kenner (# [phone]), to Request Referral for Neurology Consult, Per Granddaughter, [name]'s Request. [place] with Granddaughter, [name] to [employer] with CSW (# [phone]), if She Has Questions, Needs Assistance, or If Additional Social Work Needs Are Identified Between Now & Our Next Scheduled Telephone [employer].        SDOH assessments and interventions completed:  Yes.  SDOH Interventions Today    Flowsheet Row Most Recent Value  SDOH Interventions   Food Insecurity Interventions Intervention Not Indicated  [Verified by Granddaughter, [name]]  Housing Interventions Intervention Not Indicated  [Verified by Granddaughter, [name]]  Transportation Interventions Intervention Not Indicated, Patient Resources (Friends/Family), Payor Benefit  [Verified by Granddaughter, [name]]  Utilities Interventions  Intervention Not Indicated  Alcohol Usage Interventions Intervention Not Indicated (Score <7)  [Verified by Granddaughter, [name]]  Financial Strain Interventions Intervention Not Indicated  [Verified by Granddaughter, [name]]  Physical Activity Interventions Patient Refused  [Verified by Granddaughter, [name]]  Stress Interventions Intervention Not Indicated  [Verified by Granddaughter, [name]]  Social Connections Interventions Intervention Not Indicated  [Verified by Granddaughter, [name]]     Care Coordination Interventions:  Yes, provided.   Follow up plan: Follow up call scheduled for 11/19/2022 at 9:00 am.   Encounter Outcome:  Pt. Visit Completed.   Nat Christen, BSW, MSW, LCSW  Licensed Education officer, environmental Health System  Mailing [place] [address], [place], [place] [address] Physical Address-[address] [place],  [place], [place] [address] [place] # [phone] Fax # [phone] Cell # [phone] [email or web address]

## 2022-11-05 NOTE — Consult Note (Signed)
Telestroke cart was activated at [employer]. Per treatment team, pt's LKW was unclear with c/o R sided weakness, R facial droop, and unable to follow commands. EDP at bedside assessing patient at 83. Pt transported to CT at 1813. [facility] was paged for code stroke at 1811. Dr. Rory Percy, [facility] appeared on telestroke cart at 1815 to assess the patient. Based on [facility]'s assessment, pt does not meet criteria for emergent interventions at this time 2/2 onset time. [facility] to f/u with EDP regarding recommendations. No further needs from Telestroke RN at this time. Telestroke cart disconnected at [employer].

## 2022-11-05 NOTE — H&P (Signed)
History and Physical    Patient: [name] DOB: 05/29/1936 DOA: 11/05/2022 DOS: the patient was seen and examined on 11/06/2022 PCP: Celene Squibb, MD  Patient coming from: Home  Chief Complaint:  Chief Complaint  Patient presents with   Stroke Symptoms   HPI: [name] is an 87 y.o. female with medical history significant of chronic respiratory failure on supplemental oxygen at 2 LPM at night, COPD, atrial fibrillation, CHF, suspected pancreatic cancer, tachycardia-bradycardia syndrome s/p pacemaker, CAD who presented to the emergency department due to strokelike symptoms.  Patient was unable to provide history, history was obtained from EDP and ED medical record.  Per report, granddaughter reported that patient last well known time was 8:30PM, she was noted to be confused when seen today around 10 AM by granddaughter.  Patient was reported to have had multiple falls at home and was noted with right-sided weakness.  She sustained another fall while trying to pick up a call from her Dr. this morning.  EMS was initially activated, but patient refused to go to the hospital, EMS was reactivated later in the day by granddaughter due to concern for patient's right-sided weakness and right-sided facial droop with difficulty in being able to follow commands. Patient was recently admitted from 3/25 to 10/30/2022 due to community acquired pneumonia, primary delusional and hypertensive urgency.  ED Course:  In the emergency department, she was tachypneic, tachycardic, BP was 174/121, other vital signs were within normal range.  Workup in the ED showed normal CBC and BMP except for potassium of 3.2, lactic acid 2.6 > 1.7, urinalysis was unimpressive for UTI, TSH 1.18, urine drug screen positive for benzodiazepine (patient received Ativan in the ED), ammonia 19.  Blood culture pending. Chest x-ray showed developing mild cardiogenic failure.  Tortuosity and ectasia of the thoracic aorta. CT  angiography head and neck 1. Limited study due to motion artifact. 2. Severe narrowing at the left P1 segment. On coronal thick MIPS there is question of luminal filling defect but given the degree of motion this may instead be atheromatous. 3. Right ICA origin stenosis that is likely flow reducing with possible downstream underfilling. 4. 70% narrowing at the right subclavian origin. 5. High-grade narrowing of the right vertebral artery at its origin and vertebrobasilar junction. Advanced narrowing of the non dominant left V4 segment as well. 6. Moderate distal basilar stenosis. 7. Severe PCA atheromatous irregularity. 8. Noncontributory CT perfusion. CT cervical spine without contrast Motion limited study. No recent fracture is seen in cervical spine. Cervical spondylosis with encroachment of neural foramina at multiple levels. There is mild extrinsic pressure over the ventral margin of thecal sac caused by posterior bony spurs without significant central spinal stenosis.  Thyroid is enlarged with inhomogeneous attenuation. Nonemergent thyroid sonogram may be considered. CT head without contrast showed no acute or interval finding IV Ativan 1 mg x 1 and Seroquel 50 mg orally x 1 was given.  IV hydration of 1 L LR was provided. Teleneurologist was consulted and recommended repeat CT head in 24 hours and to see if there is any evidence of evolving stroke.   Review of Systems: Review of systems as noted in the HPI. All other systems reviewed and are negative.   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   CATARACT EXTRACTION     CHOLECYSTECTOMY     COLONOSCOPY  2011   COLONOSCOPY N/A 10/19/2014   Procedure: COLONOSCOPY;  Surgeon: Rogene Houston, MD;  Location: [facility];  Service: Endoscopy;  Laterality: N/A;  1030   CORONARY STENT INTERVENTION N/A 05/28/2021   Procedure: CORONARY STENT INTERVENTION;  Surgeon: Leonie Man, MD;  Location: [place] [facility];  Service: Cardiovascular;  Laterality: N/A;   CORONARY ULTRASOUND/IVUS N/A 05/28/2021   Procedure: Intravascular Ultrasound/IVUS;  Surgeon: Leonie Man, MD;  Location: [place] [facility];  Service: Cardiovascular;  Laterality: N/A;   ESOPHAGEAL DILATION N/A 05/02/2020   Procedure: ESOPHAGEAL DILATION;  Surgeon: Rogene Houston, MD;  [facility];  Service: Gastroenterology;  Laterality: N/A;   ESOPHAGOGASTRODUODENOSCOPY N/A 10/26/2014   Procedure: ESOPHAGOGASTRODUODENOSCOPY (EGD);  Surgeon: Rogene Houston, MD;  [facility];  Service: Endoscopy;  Laterality: N/A;  70 - Dr. has lunch and learn   ESOPHAGOGASTRODUODENOSCOPY N/A 02/24/2022   Procedure: ESOPHAGOGASTRODUODENOSCOPY (EGD);  Surgeon: Irving Copas., MD;  Location: [facility];  Service: Gastroenterology;  Laterality: N/A;   ESOPHAGOGASTRODUODENOSCOPY (EGD) WITH PROPOFOL N/A 05/02/2020   Procedure: ESOPHAGOGASTRODUODENOSCOPY (EGD) WITH PROPOFOL;  Surgeon: Rogene Houston, MD;  [facility];  Service: Gastroenterology;  Laterality: N/A;  250    Esophagogastroduodenoscopy with esophageal dilation  2004, 2006, 2007   EUS N/A 02/24/2022   Procedure: UPPER ENDOSCOPIC ULTRASOUND (EUS) LINEAR;  Surgeon: Irving Copas., MD;  Location: [facility];  Service: Gastroenterology;  Laterality: N/A;   FINE NEEDLE ASPIRATION  02/24/2022   Procedure: FINE NEEDLE ASPIRATION;  Surgeon: Rush Landmark Telford Nab., MD;  Location: [facility];  Service: Gastroenterology;;  red path sent   GIVENS CAPSULE STUDY N/A 10/31/2014   Procedure: GIVENS CAPSULE STUDY;  Surgeon: Rogene Houston, MD;  [facility];  Service: Endoscopy;  Laterality: N/A;  730 -- pacemaker--needs monitoring--outpatient bed   INSERT / REPLACE / REMOVE PACEMAKER  2010   LEFT HEART CATH AND CORONARY ANGIOGRAPHY N/A 03/21/2021   Procedure: LEFT HEART CATH AND CORONARY ANGIOGRAPHY;  Surgeon: Belva Crome, MD;  Location: [place] [facility];  Service: Cardiovascular;  Laterality: N/A;   LEFT HEART CATHETERIZATION WITH CORONARY ANGIOGRAM N/A 11/17/2011   Procedure: LEFT HEART CATHETERIZATION WITH CORONARY ANGIOGRAM;  Surgeon: Sherren Mocha, MD;  Location: [facility];  Service: Cardiovascular;  Laterality: N/A;   LEFT HEART CATHETERIZATION WITH CORONARY ANGIOGRAM N/A 09/26/2014   Procedure: LEFT HEART CATHETERIZATION WITH CORONARY ANGIOGRAM;  Surgeon: Leonie Man, MD;  Location: [facility] CATH LAB;  Service: Cardiovascular;  Laterality: N/A;   PPM GENERATOR CHANGEOUT N/A 07/16/2022   Procedure: PPM  GENERATOR CHANGEOUT;  Surgeon: Evans Lance, MD;  Location: [place] [facility];  Service: Cardiovascular;  Laterality: N/A;   Right rotator cuff repair     SAVORY DILATION N/A 02/24/2022   Procedure: SAVORY DILATION;  Surgeon: Rush Landmark Telford Nab., MD;  Location: [facility];  Service: Gastroenterology;  Laterality: N/A;   SHOULDER ACROMIOPLASTY Right 05/30/2015   Procedure: RIGHT SHOULDER ACROMIOPLASTY;  Surgeon: Carole Civil, MD;  Location: [facility];  Service: Orthopedics;   Laterality: Right;   SHOULDER OPEN ROTATOR CUFF REPAIR Right 05/30/2015   Procedure: OPEN ROTATOR CUFF REPAIR RIGHT SHOULDER;  Surgeon: Carole Civil, MD;  Location: [facility];  Service: Orthopedics;  Laterality: Right;   SHOULDER OPEN ROTATOR CUFF REPAIR Left 10/22/2016   Procedure: ROTATOR CUFF REPAIR SHOULDER OPEN;  Surgeon: Carole Civil, MD;  Location: [facility];  Service: Orthopedics;  Laterality: Left;   Subxiphoid pericardial window  11/10   VAGINAL HYSTERECTOMY      Social History:  reports that she quit smoking about 33 years ago. Her smoking use included cigarettes. She has a 20.00 pack-year smoking history. She has been exposed to tobacco smoke. She has never used smokeless tobacco. She reports that she does not drink alcohol and does not use drugs.   Allergies  Allergen Reactions   Amitriptyline Hcl Other (See Comments)    Caused "jaws to twist and lock"   Plavix [Clopidogrel Bisulfate] Hives   Sulfonamide Derivatives Other (See Comments)    UNKNOWN REACTION    Family History  Problem Relation Age of Onset   Cancer Mother        Colon    Coronary artery disease Sister    Coronary artery disease Brother    Arthritis Other    Lung disease Other    Asthma Other      Prior to Admission medications   Medication Sig Start Date End Date Taking? Authorizing Provider  albuterol (PROVENTIL) (2.5 MG/3ML) 0.083% nebulizer solution Take 3 mLs (2.5 mg total) by nebulization every 4 (four) hours as needed for wheezing or shortness of breath. 07/02/22 07/02/23 Yes Emokpae, Courage, MD  ALPRAZolam Duanne Moron) 1 MG tablet Take 1 mg by mouth 3 (three) times daily as needed for anxiety. 04/04/21  Yes [provider]  amiodarone (PACERONE) 200 MG tablet TAKE 1 TABLET ONCE DAILY ON MONDAY THROUGH SATURDAY, NONE ON SUNDAY. Patient taking differently: Take 200 mg by mouth See admin instructions. TAKE 1 TABLET ONCE DAILY ON MONDAY THROUGH SATURDAY, NONE ON SUNDAY. 08/07/22  Yes Evans Lance, MD  amLODipine (NORVASC) 5 MG tablet Take 1 tablet (5 mg total) by mouth daily. 07/02/22  Yes Roxan Hockey, MD  aspirin EC 81 MG tablet Take 1 tablet (81 mg total) by mouth daily with breakfast. Swallow whole. 07/02/22  Yes Emokpae, Courage, MD  Cyanocobalamin (VITAMIN B-12) 5000 MCG SUBL Take 5,000 mcg by mouth daily.   Yes [provider]  dicyclomine (BENTYL) 10 MG capsule Take 1 capsule (10 mg total) by mouth 2 (two) times daily as needed for spasms. 09/25/22  Yes Carlan, Chelsea L, NP  furosemide (LASIX) 40 MG tablet Take 1 tablet (40 mg total) by mouth daily as needed for fluid or edema. Patient taking differently: Take 20 mg by mouth daily. 03/22/21  Yes Florencia Reasons, MD  isosorbide mononitrate (IMDUR) 30 MG 24 hr tablet Take 1 tablet (30 mg total) by mouth daily. 07/02/22  Yes Emokpae, Courage, MD  JARDIANCE 25 MG TABS tablet Take 25  mg by mouth daily.   Yes [provider]  metoprolol succinate (TOPROL-XL) 50 MG 24 hr tablet Take 1 tablet (50 mg total) by mouth 2 (two) times daily. Take with or immediately following a meal. 07/02/22  Yes Emokpae, Courage, MD  Multiple Vitamin (MULTIVITAMIN WITH MINERALS) TABS tablet Take 1 tablet by mouth daily.    Yes [provider]  nitroGLYCERIN (NITROSTAT) 0.4 MG SL tablet Place 1 tablet (0.4 mg total) under the tongue every 5 (five) minutes x 3 doses as needed for chest pain. 03/22/21  Yes Florencia Reasons, MD  ondansetron (ZOFRAN) 4 MG tablet Take 4 mg by mouth every 6 (six) hours.   Yes [provider]  OXYGEN Inhale 2 L into the lungs as needed (PRN as needed at night).   Yes [provider]  potassium chloride (KLOR-CON) 10 MEQ tablet Take 1 tablet (10 mEq total) by mouth daily. Take While taking Lasix/furosemide 07/02/22  Yes Emokpae, Courage, MD  primidone (MYSOLINE) 50 MG tablet Take 50 mg by mouth at bedtime. 10/31/10  Yes [provider]  PROAIR HFA 108 (90 Base) MCG/ACT inhaler Inhale 1 puff  into the lungs every 4 (four) hours as needed for wheezing or shortness of breath.  11/10/18  Yes [provider]  ranolazine (RANEXA) 500 MG 12 hr tablet Take 1 tablet (500 mg total) by mouth 2 (two) times daily. 07/02/22  Yes Emokpae, Courage, MD  rosuvastatin (CRESTOR) 40 MG tablet Take 1 tablet (40 mg total) by mouth daily. 07/02/22  Yes Emokpae, Courage, MD  senna-docusate (SENOKOT-S) 8.6-50 MG tablet Take 1 tablet by mouth at bedtime. Patient taking differently: Take 1-2 tablets by mouth daily as needed (constipation.). 03/22/21  Yes Florencia Reasons, MD  triamcinolone cream (KENALOG) 0.1 % Apply 1 Application topically daily as needed (for rash/skin irritation). 08/25/22  Yes [provider]  vitamin E 180 MG (400 UNITS) capsule Take 400 Units by mouth daily.   Yes [provider]    Physical Exam: BP (!) 157/90   Pulse 100   Temp 98.2 F (36.8 C)   Resp 20   Ht 5\' 5"  (1.651 m)   Wt 64.6 kg   SpO2 99%   BMI 23.70 kg/m   General: 87 y.o. year-old female well developed well nourished in no acute distress.   HEENT: NCAT, EOMI Neck: Supple, trachea medial Cardiovascular: Regular rate and rhythm with no rubs or gallops.  No thyromegaly or JVD noted.  No lower extremity edema. 2/4 pulses in all 4 extremities. Respiratory: Clear to auscultation with no wheezes or rales. Good inspiratory effort. Abdomen: Soft, nontender nondistended with normal bowel sounds x4 quadrants. Muskuloskeletal: No cyanosis, clubbing or edema noted bilaterally Neuro: CN II-XII intact, strength 5/5 x 4, sensation, reflexes intact Skin: Scattered ecchymosis.  Skin moist and dry.   Psychiatry: Mood is appropriate for condition and setting          Labs on Admission:  Basic Metabolic Panel: Recent Labs  Lab 11/01/22 0446 11/05/22 1817 11/05/22 1833  NA 136 140 137  K 3.4* 3.4* 3.2*  CL 100 105 103  CO2 23  --  23  GLUCOSE 98 98 98  BUN 22 14 15   CREATININE 1.09* 0.70 0.84  CALCIUM  9.7  --  9.5   Liver Function Tests: Recent Labs  Lab 11/01/22 0446 11/05/22 1833  AST 36 33  ALT 26 23  ALKPHOS 87 80  BILITOT 0.6 1.1  PROT 7.9 7.6  ALBUMIN  4.2 3.9   Recent Labs  Lab 11/01/22 0446  LIPASE 39   Recent Labs  Lab 11/05/22 1833  AMMONIA 19   CBC: Recent Labs  Lab 11/01/22 0446 11/05/22 1817 11/05/22 1908  WBC 7.9  --  8.6  NEUTROABS 5.3  --  5.9  HGB 13.7 13.6 13.1  HCT 41.5 40.0 40.3  MCV 92.2  --  92.9  PLT 208  --  208   Cardiac Enzymes: No results for input(s): "CKTOTAL", "CKMB", "CKMBINDEX", "TROPONINI" in the last 168 hours.  BNP (last 3 results) Recent Labs    06/29/22 1145  BNP 941.0*    ProBNP (last 3 results) No results for input(s): "PROBNP" in the last 8760 hours.  CBG: No results for input(s): "GLUCAP" in the last 168 hours.  Radiological Exams on Admission: DG Chest Port 1 View  Result Date: 11/05/2022 CLINICAL DATA:  Weakness, coronary artery disease, COPD EXAM: PORTABLE CHEST 1 VIEW COMPARISON:  11/01/2022 FINDINGS: The lungs are symmetrically well expanded. No pneumothorax or pleural effusion. Mild progressive central pulmonary vascular congestion and trace diffuse interstitial pulmonary edema, likely cardiogenic in nature, are new since prior examination. Cardiac size is within normal limits. Tortuosity and ectasia of the thoracic aorta is noted, better assessed on CT examination of 11/01/2022. Left subclavian dual lead pacemaker is unchanged. IMPRESSION: 1. Developing, mild cardiogenic failure. 2. Tortuosity and ectasia of the thoracic aorta. Electronically Signed   By: Fidela Salisbury M.D.   On: 11/05/2022 20:04   CT ANGIO HEAD NECK W WO CM W PERF (CODE STROKE)  Result Date: 11/05/2022 CLINICAL DATA:  Neuro deficit with acute stroke suspected. Right-sided weakness and facial droop EXAM: CT ANGIOGRAPHY HEAD AND NECK CT PERFUSION BRAIN TECHNIQUE: Multidetector CT imaging of the head and neck was performed using the standard  protocol during bolus administration of intravenous contrast. Multiplanar CT image reconstructions and MIPs were obtained to evaluate the vascular anatomy. Carotid stenosis measurements (when applicable) are obtained utilizing NASCET criteria, using the distal internal carotid diameter as the denominator. Multiphase CT imaging of the brain was performed following IV bolus contrast injection. Subsequent parametric perfusion maps were calculated using RAPID software. RADIATION DOSE REDUCTION: This exam was performed according to the departmental dose-optimization program which includes automated exposure control, adjustment of the mA and/or kV according to patient size and/or use of iterative reconstruction technique. CONTRAST:  177mL OMNIPAQUE IOHEXOL 350 MG/ML SOLN COMPARISON:  Head CT from earlier today. FINDINGS: Aortic arch: Atheromatous plaque.  Three vessel branching Right carotid system: Atheromatous plaque at the bifurcation. High-grade stenosis is suspected although there is limited quantification due to more shin artifact. Narrowing is over 50%. Left carotid system: Atheromatous plaque at the common carotid origin and at the bifurcation without flow reducing stenosis or ulceration. Vertebral arteries: Calcified plaque at the proximal right subclavian causes 70% stenosis as measured on coronal reformats. Advanced narrowing at the right vertebral origin due to atheromatous plaque. Moderate narrowing at the left vertebral origin. No dissection or beading. Skeleton: Ordinary cervical spine degeneration Other neck: No acute finding Upper chest: Emphysema. Review of the MIP images confirms the above findings CTA HEAD FINDINGS Anterior circulation: Limited by motion. Extensive atheromatous calcification of the cavernous carotids. There may be significant narrowing at the right MCA bifurcation, although degraded by motion. No emergent large vessel occlusion Posterior circulation: Right dominant vertebral artery.  Before atheromatous plaque with high-grade narrowing affecting the right more than left vertebral artery. Moderately extensive atheromatous irregularity of the basilar. Severe  left P1 segment stenosis with downstream underfilling. Bilateral PCA atheromatous irregularity is extensive. Venous sinuses: Unremarkable. CT Brain Perfusion Findings: ASPECTS: 10 CBF (<30%) Volume: 51mL Perfusion (Tmax>6.0s) volume: 45mL There is notable motion artifact. IMPRESSION: 1. Limited study due to motion artifact. 2. Severe narrowing at the left P1 segment. On coronal thick MIPS there is question of luminal filling defect but given the degree of motion this may instead be atheromatous. 3. Right ICA origin stenosis that is likely flow reducing with possible downstream underfilling. 4. 70% narrowing at the right subclavian origin. 5. High-grade narrowing of the right vertebral artery at its origin and vertebrobasilar junction. Advanced narrowing of the non dominant left V4 segment as well. 6. Moderate distal basilar stenosis. 7. Severe PCA atheromatous irregularity. 8. Noncontributory CT perfusion. Electronically Signed   By: Jorje Guild M.D.   On: 11/05/2022 19:26   CT Cervical Spine Wo Contrast  Result Date: 11/05/2022 CLINICAL DATA:  Trauma, multiple falls EXAM: CT CERVICAL SPINE WITHOUT CONTRAST TECHNIQUE: Multidetector CT imaging of the cervical spine was performed without intravenous contrast. Multiplanar CT image reconstructions were also generated. RADIATION DOSE REDUCTION: This exam was performed according to the departmental dose-optimization program which includes automated exposure control, adjustment of the mA and/or kV according to patient size and/or use of iterative reconstruction technique. COMPARISON:  11/01/2022 FINDINGS: There is patient motion in some of the images limiting the study. Alignment: Alignment of posterior margins of vertebral bodies is within normal limits. Skull base and vertebrae: No recent  fracture is seen. Soft tissues and spinal canal: Posterior bony spurs causing mild extrinsic pressure on the ventral margin of thecal sac from C3 to C6 levels. Disc levels: There is minimal encroachment of right neural foramen at C2-C3 level. There is mild encroachment of neural foramina from C3-C7 levels. Upper chest: Linear patchy pleural densities are seen in both apices. Other: Thyroid is enlarged with inhomogeneous attenuation. IMPRESSION: Motion limited study. No recent fracture is seen in cervical spine. Cervical spondylosis with encroachment of neural foramina at multiple levels. There is mild extrinsic pressure over the ventral margin of thecal sac caused by posterior bony spurs without significant central spinal stenosis. Thyroid is enlarged with inhomogeneous attenuation. Nonemergent thyroid sonogram may be considered. Electronically Signed   By: Elmer Picker M.D.   On: 11/05/2022 19:17   CT HEAD CODE STROKE WO CONTRAST  Result Date: 11/05/2022 CLINICAL DATA:  Code stroke. Neuro deficit with acute stroke suspected EXAM: CT HEAD WITHOUT CONTRAST TECHNIQUE: Contiguous axial images were obtained from the base of the skull through the vertex without intravenous contrast. RADIATION DOSE REDUCTION: This exam was performed according to the departmental dose-optimization program which includes automated exposure control, adjustment of the mA and/or kV according to patient size and/or use of iterative reconstruction technique. COMPARISON:  Head CT 11/01/2022 FINDINGS: Brain: No evidence of acute infarction, hemorrhage, hydrocephalus, extra-axial collection or mass lesion/mass effect. Chronic small vessel ischemia with chronic perforator infarct at the left corona radiata. Vascular: No hyperdense vessel or unexpected calcification. Skull: Normal. Negative for fracture or focal lesion. Sinuses/Orbits: No acute finding. Other: Prelim sent in epic chat. ASPECTS [facility] Stroke Program Early CT Score) Not  scored without localizing history. IMPRESSION: No acute or interval finding. Electronically Signed   By: Jorje Guild M.D.   On: 11/05/2022 18:32    EKG: I independently viewed the EKG done and my findings are as followed: Atrial paced rhythm at a rate of 101 bpm  Assessment/Plan Present on Admission:  Hypokalemia  Hypertensive urgency  Essential hypertension  Chronic diastolic heart failure  Chronic respiratory failure with hypoxia  Atrial fibrillation, chronic  Tachycardia-bradycardia syndrome  Chronic obstructive lung disease  Principal Problem:   Right sided weakness Active Problems:   Chronic obstructive lung disease   Chronic respiratory failure with hypoxia   Tachycardia-bradycardia syndrome   Hypokalemia   Chronic diastolic heart failure   Atrial fibrillation, chronic   Hypertensive urgency   Essential hypertension   Acute metabolic encephalopathy   Recurrent falls  Right-sided weakness, rule out acute ischemic stroke CT of head without contrast will be repeated in 24 hours per teleneurologist recommendation  Acute metabolic encephalopathy possibly secondary to multifactorial Patient symptoms may be due to underlying dementia CBC and BMP were normal except for hypokalemia, TSH was normal, B12 2691, urinalysis was positive for glycosuria. Continue fall precaution Consider psychiatric evaluation prior to discharge to find out what she would need that would best serve her from her healthcare needs perspective per neurologist recommendation Refer patient to outpatient neurology in 8 to 12 weeks for formal outpatient neuropsychological testing per neurologist's recommendation  Hypokalemia  K+ 3.2, this will be replenished  Recurrent falls at home Continue fall precaution Consult PT/OT eval and treat  Hypertensive urgency-resolved Essential hypertension-uncontrolled Continue Toprol-XL Imdur, amlodipine Continue IV hydralazine 10 mg every 6 hours as needed for SBP  > 170   Chronic atrial fibrillation s/p pacemaker implantation Continue aspirin, Toprol-XL, amiodarone   Chronic diastolic CHF Echo from 0000000 with EF of 55 to 60%, no regional wall motion abnormalities Continue home meds   Tachycardia-bradycardia syndrome Patient has a permanent pacemaker and follows with Dr. Lovena Le per medical record   CAD s/p stent placement Continue aspirin, Imdur, statin, Brilinta, Ranexa   Chronic respiratory failure with hypoxia Continue home oxygen   COPD Continue Proventil   DVT prophylaxis: Lovenox  Code Status: DNR  Consults: None  Family Communication: None at bedside  Severity of Illness: The appropriate patient status for this patient is OBSERVATION. Observation status is judged to be reasonable and necessary in order to provide the required intensity of service to ensure the patient's safety. The patient's presenting symptoms, physical exam findings, and initial radiographic and laboratory data in the context of their medical condition is felt to place them at decreased risk for further clinical deterioration. Furthermore, it is anticipated that the patient will be medically stable for discharge from the hospital within 2 midnights of [email or web address]   Author: Bernadette Hoit, DO 11/06/2022 2:24 AM  For on call review [email or web address]

## 2022-11-05 NOTE — ED Provider Notes (Signed)
[facility] Provider Note  CSN: [id number] Arrival date & time: 11/05/22 1809  Chief Complaint(s) Stroke Symptoms  HPI [name] is a 87 y.o. female with history of coronary artery disease, tachybradycardia syndrome status post pacemaker, atrial fibrillation presenting to the emergency department with right-sided weakness.  Per granddaughter, patient last seen normal yesterday, today seemed increasingly confused, with multiple falls and weakness on the right side.  EMS was called to the house around 10 AM but the patient refused to come to the hospital.  She had a few further falls at home per granddaughter.  Granddaughter called EMS again out of concern for the right-sided weakness and they brought her in as a code stroke.  Patient seems very confused and unable to provide much history, with hyperreligious speech.  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    Essential hypertension    GERD (gastroesophageal reflux disease)    H/O hiatal hernia    Headache(784.0)    HOH (hard of hearing)    Hyperlipidemia    Neuromuscular disorder    Tremors   Pericardial effusion    Hemorrhagic    Presence of permanent cardiac pacemaker    Tachycardia-bradycardia syndrome    s/p Medtronic Adapta L dual chamber device  5/10   Patient Active Problem List   Diagnosis Date Noted   Right sided weakness 11/05/2022   Paranoid delusion 10/28/2022   Current moderate episode of major depressive disorder 10/28/2022   CAP (community acquired pneumonia) 10/27/2022   Hardening of the aorta (main artery of the heart) 10/27/2022   Pruritic rash 10/27/2022    Generalized abdominal pain 09/29/2022   Chronic obstructive lung disease 06/29/2022   Chronic respiratory failure with hypoxia [id number]   Chronic systolic heart failure 123XX123   IPMN (intraductal papillary mucinous neoplasm) 06/09/2022   Constipation 06/09/2022   Atherosclerosis of coronary artery without angina pectoris 04/23/2022   Upper abdominal pain 12/03/2021   Hearing difficulty 06/19/2021   Neck pain 05/16/2021   Coronary arteriosclerosis 05/02/2021   Nausea 04/17/2021   Productive cough 04/17/2021   Chest pain 03/30/2021   COVID-19 virus infection 03/30/2021   Non-STEMI (non-ST elevated myocardial infarction)    Mass of pancreas 03/20/2021   Hypertensive crisis 03/19/2021   Elevated troponin 03/19/2021   Prolonged QT interval 03/19/2021   Edema of lower extremity 01/14/2021   Impaired fasting glucose 01/07/2021   Generalized anxiety disorder 01/07/2021   Essential tremor 01/07/2021   Proteinuria 01/07/2021   Essential hypertension 01/02/2021   Iron deficiency anemia 01/02/2021   Prediabetes 01/02/2021   Difficulty swallowing 07/03/2020   Nonspecific chest pain 09/15/2016   Complete rotator cuff tear of left shoulder    Anemia 10/31/2014   H/O heart artery stent 10/30/2014   Chest pain with moderate risk for cardiac etiology [id number]   Diastolic dysfunction with chronic heart failure 08/23/2014   Chronic diastolic heart failure [id number]   Abdominal pain 08/23/2014   Acquired obstruction of both nasolacrimal ducts 06/15/2014   Lower abdominal pain 01/27/2012   Hypokalemia 06/03/2011   PERICARDIAL EFFUSION 07/10/2009  Disease of pericardium 07/10/2009   Essential hypertension, benign 04/18/2009   Triple vessel disease of the heart 04/18/2009   Benign essential hypertension 04/18/2009   PPM-Medtronic 04/04/2009   Sinoatrial node dysfunction 01/26/2009   HLD (hyperlipidemia) 12/19/2008   Anxiety 12/19/2008   Atrial fibrillation 12/19/2008   GERD  (gastroesophageal reflux disease) 12/19/2008   Abnormal involuntary movements 12/19/2008   Afib 12/19/2008   Mixed hyperlipidemia 12/19/2008   Home Medication(s) Prior to Admission medications   Medication Sig Start Date End Date Taking? Authorizing Provider  albuterol (PROVENTIL) (2.5 MG/3ML) 0.083% nebulizer solution Take 3 mLs (2.5 mg total) by nebulization every 4 (four) hours as needed for wheezing or shortness of breath. 07/02/22 07/02/23 Yes Emokpae, Courage, MD  ALPRAZolam Duanne Moron) 1 MG tablet Take 1 mg by mouth 3 (three) times daily as needed for anxiety. 04/04/21  Yes [provider]  amiodarone (PACERONE) 200 MG tablet TAKE 1 TABLET ONCE DAILY ON MONDAY THROUGH SATURDAY, NONE ON SUNDAY. Patient taking differently: Take 200 mg by mouth See admin instructions. TAKE 1 TABLET ONCE DAILY ON MONDAY THROUGH SATURDAY, NONE ON SUNDAY. 08/07/22  Yes Evans Lance, MD  amLODipine (NORVASC) 5 MG tablet Take 1 tablet (5 mg total) by mouth daily. 07/02/22  Yes Roxan Hockey, MD  aspirin EC 81 MG tablet Take 1 tablet (81 mg total) by mouth daily with breakfast. Swallow whole. 07/02/22  Yes Emokpae, Courage, MD  Cyanocobalamin (VITAMIN B-12) 5000 MCG SUBL Take 5,000 mcg by mouth daily.   Yes [provider]  dicyclomine (BENTYL) 10 MG capsule Take 1 capsule (10 mg total) by mouth 2 (two) times daily as needed for spasms. 09/25/22  Yes Carlan, Chelsea L, NP  furosemide (LASIX) 40 MG tablet Take 1 tablet (40 mg total) by mouth daily as needed for fluid or edema. Patient taking differently: Take 20 mg by mouth daily. 03/22/21  Yes Florencia Reasons, MD  isosorbide mononitrate (IMDUR) 30 MG 24 hr tablet Take 1 tablet (30 mg total) by mouth daily. 07/02/22  Yes Emokpae, Courage, MD  JARDIANCE 25 MG TABS tablet Take 25 mg by mouth daily.   Yes [provider]  metoprolol succinate (TOPROL-XL) 50 MG 24 hr tablet Take 1 tablet (50 mg total) by mouth 2 (two) times daily. Take with or  immediately following a meal. 07/02/22  Yes Emokpae, Courage, MD  Multiple Vitamin (MULTIVITAMIN WITH MINERALS) TABS tablet Take 1 tablet by mouth daily.    Yes [provider]  nitroGLYCERIN (NITROSTAT) 0.4 MG SL tablet Place 1 tablet (0.4 mg total) under the tongue every 5 (five) minutes x 3 doses as needed for chest pain. 03/22/21  Yes Florencia Reasons, MD  ondansetron (ZOFRAN) 4 MG tablet Take 4 mg by mouth every 6 (six) hours.   Yes [provider]  OXYGEN Inhale 2 L into the lungs as needed (PRN as needed at night).   Yes [provider]  potassium chloride (KLOR-CON) 10 MEQ tablet Take 1 tablet (10 mEq total) by mouth daily. Take While taking Lasix/furosemide 07/02/22  Yes Emokpae, Courage, MD  primidone (MYSOLINE) 50 MG tablet Take 50 mg by mouth at bedtime. 10/31/10  Yes [provider]  PROAIR HFA 108 (90 Base) MCG/ACT inhaler Inhale 1 puff into the lungs every 4 (four) hours as needed for wheezing or shortness of breath.  11/10/18  Yes [provider]  ranolazine (RANEXA) 500 MG 12 hr tablet Take 1 tablet (500 mg total) by mouth 2 (two) times daily.  07/02/22  Yes Emokpae, Courage, MD  rosuvastatin (CRESTOR) 40 MG tablet Take 1 tablet (40 mg total) by mouth daily. 07/02/22  Yes Emokpae, Courage, MD  senna-docusate (SENOKOT-S) 8.6-50 MG tablet Take 1 tablet by mouth at bedtime. Patient taking differently: Take 1-2 tablets by mouth daily as needed (constipation.). 03/22/21  Yes Florencia Reasons, MD  triamcinolone cream (KENALOG) 0.1 % Apply 1 Application topically daily as needed (for rash/skin irritation). 08/25/22  Yes [provider]  vitamin E 180 MG (400 UNITS) capsule Take 400 Units by mouth daily.   Yes [provider]                                                                                                                                    Past Surgical History Past Surgical History:  Procedure Laterality Date   APPENDECTOMY      BIOPSY  02/24/2022   Procedure: BIOPSY;  Surgeon: Rush Landmark Telford Nab., MD;  Location: [facility];  Service: Gastroenterology;;   CARDIAC CATHETERIZATION  2010   stents x2.   CATARACT EXTRACTION     CHOLECYSTECTOMY     COLONOSCOPY  2011   COLONOSCOPY N/A 10/19/2014   Procedure: COLONOSCOPY;  Surgeon: Rogene Houston, MD;  Location: [facility];  Service: Endoscopy;  Laterality: N/A;  1030   CORONARY STENT INTERVENTION N/A 05/28/2021   Procedure: CORONARY STENT INTERVENTION;  Surgeon: Leonie Man, MD;  Location: [facility];  Service: Cardiovascular;  Laterality: N/A;   CORONARY ULTRASOUND/IVUS N/A 05/28/2021   Procedure: Intravascular Ultrasound/IVUS;  Surgeon: Leonie Man, MD;  Location: [facility];  Service: Cardiovascular;  Laterality: N/A;   ESOPHAGEAL DILATION N/A 05/02/2020   Procedure: ESOPHAGEAL DILATION;  Surgeon: Rogene Houston, MD;  [facility]: [facility];  Service: Gastroenterology;  Laterality: N/A;   ESOPHAGOGASTRODUODENOSCOPY N/A 10/26/2014   Procedure: ESOPHAGOGASTRODUODENOSCOPY (EGD);  Surgeon: Rogene Houston, MD;  [facility]: [facility];  Service: Endoscopy;  Laterality: N/A;  72 - Dr. has lunch and learn   ESOPHAGOGASTRODUODENOSCOPY N/A 02/24/2022   Procedure: ESOPHAGOGASTRODUODENOSCOPY (EGD);  Surgeon: Irving Copas., MD;  Location: [facility];  Service: Gastroenterology;  Laterality: N/A;   ESOPHAGOGASTRODUODENOSCOPY (EGD) WITH PROPOFOL N/A 05/02/2020   Procedure: ESOPHAGOGASTRODUODENOSCOPY (EGD) WITH PROPOFOL;  Surgeon: Rogene Houston, MD;  [facility];  Service: Gastroenterology;  Laterality: N/A;  250   Esophagogastroduodenoscopy with esophageal dilation  2004, 2006, 2007   EUS N/A 02/24/2022   Procedure: UPPER ENDOSCOPIC ULTRASOUND (EUS) LINEAR;  Surgeon: Irving Copas., MD;  Location: [facility];  Service: Gastroenterology;  Laterality: N/A;   FINE NEEDLE ASPIRATION  02/24/2022   Procedure: FINE  NEEDLE ASPIRATION;  Surgeon: Rush Landmark Telford Nab., MD;  Location: [facility];  Service: Gastroenterology;;  red path sent   GIVENS CAPSULE STUDY N/A 10/31/2014   Procedure: GIVENS CAPSULE STUDY;  Surgeon: Rogene Houston, MD;  [facility];  Service: Endoscopy;  Laterality: N/A;  730 -- pacemaker--needs monitoring--outpatient bed   INSERT / REPLACE / REMOVE PACEMAKER  2010   LEFT HEART CATH AND CORONARY ANGIOGRAPHY N/A 03/21/2021   Procedure: LEFT HEART CATH AND CORONARY ANGIOGRAPHY;  Surgeon: Belva Crome, MD;  Location: [facility];  Service: Cardiovascular;  Laterality: N/A;   LEFT HEART CATHETERIZATION WITH CORONARY ANGIOGRAM N/A 11/17/2011   Procedure: LEFT HEART CATHETERIZATION WITH CORONARY ANGIOGRAM;  Surgeon: Sherren Mocha, MD;  Location: [facility];  Service: Cardiovascular;  Laterality: N/A;   LEFT HEART CATHETERIZATION WITH CORONARY ANGIOGRAM N/A 09/26/2014   Procedure: LEFT HEART CATHETERIZATION WITH CORONARY ANGIOGRAM;  Surgeon: Leonie Man, MD;  Location: [facility] CATH LAB;  Service: Cardiovascular;  Laterality: N/A;   PPM GENERATOR CHANGEOUT N/A 07/16/2022   Procedure: PPM GENERATOR CHANGEOUT;  Surgeon: Evans Lance, MD;  Location: [facility];  Service: Cardiovascular;  Laterality: N/A;   Right rotator cuff repair     SAVORY DILATION N/A 02/24/2022   Procedure: SAVORY DILATION;  Surgeon: Rush Landmark Telford Nab., MD;  Location: [facility];  Service: Gastroenterology;  Laterality: N/A;   SHOULDER ACROMIOPLASTY Right 05/30/2015   Procedure: RIGHT SHOULDER ACROMIOPLASTY;  Surgeon: Carole Civil, MD;  Location: [facility];  Service: Orthopedics;  Laterality: Right;   SHOULDER OPEN ROTATOR CUFF REPAIR Right 05/30/2015   Procedure: OPEN ROTATOR CUFF REPAIR RIGHT SHOULDER;  Surgeon: Carole Civil, MD;  Location: [facility];  Service: Orthopedics;  Laterality: Right;   SHOULDER OPEN ROTATOR CUFF REPAIR Left 10/22/2016   Procedure: ROTATOR CUFF REPAIR  SHOULDER OPEN;  Surgeon: Carole Civil, MD;  Location: [facility];  Service: Orthopedics;  Laterality: Left;   Subxiphoid pericardial window  11/10   VAGINAL HYSTERECTOMY     Family History Family History  Problem Relation Age of Onset   Cancer Mother        Colon    Coronary artery disease Sister    Coronary artery disease Brother    Arthritis Other    Lung disease Other    Asthma Other     Social History Social History   Tobacco Use   Smoking status: Former    Packs/day: 2.00    Years: 10.00    Additional pack years: 0.00    Total pack years: 20.00    Types: Cigarettes    Quit date: 05/23/1989    Years since quitting: 33.4    Passive exposure: Past   Smokeless tobacco: Never   Tobacco comments:    Verified by Granddaughter, [name]  Vaping Use   Vaping Use: Never used  Substance Use Topics   Alcohol use: No    Alcohol/week: 0.0 standard drinks of alcohol   Drug use: No   Allergies Amitriptyline hcl, Plavix [clopidogrel bisulfate], and Sulfonamide derivatives  Review of Systems Review of Systems  All other systems reviewed and are negative.   Physical Exam Vital Signs  I have reviewed the triage vital signs BP (!) 174/96   Pulse 88   Temp 98.2 F (36.8 C) (Rectal)   Resp (!) 28   Ht 5\' 5"  (1.651 m)   Wt 62.1 kg   SpO2 100%   BMI 22.78 kg/m  Physical Exam Vitals and nursing note reviewed.  Constitutional:      General: She is in acute distress.     Appearance: She is well-developed.  HENT:     Head: Normocephalic and atraumatic.     Mouth/Throat:  Mouth: Mucous membranes are moist.  Eyes:     Pupils: Pupils are equal, round, and reactive to light.  Cardiovascular:     Rate and Rhythm: Normal rate and regular rhythm.     Heart sounds: No murmur heard. Pulmonary:     Effort: Pulmonary effort is normal. No respiratory distress.     Breath sounds: Normal breath sounds.  Abdominal:     General: Abdomen is flat.     Palpations: Abdomen  is soft.     Tenderness: There is no abdominal tenderness.  Musculoskeletal:        General: No tenderness.     Right lower leg: No edema.     Left lower leg: No edema.     Comments: No midline C, T, L-spine tenderness.  No chest wall tenderness or crepitus.  Full painless range of motion at the bilateral upper extremities including the shoulders, elbows, wrists, hand and fingers, and in the bilateral lower extremities including the hips, knees, ankle, toes.  No focal bony tenderness, injury or deformity.  Skin:    General: Skin is warm and dry.     Comments: Scattered ecchymoses  Neurological:     Mental Status: She is alert.     Comments: Oriented to self, not oriented to place or situation.  Follows some commands.  Diffuse tremor.  Possible slight right facial droop but moving all extremities spontaneously, strength 5 out of 5 in the bilateral upper and lower extremities, cranial nerves otherwise intact  Psychiatric:        Mood and Affect: Mood normal.        Behavior: Behavior normal.     ED Results and Treatments Labs (all labs ordered are listed, but only abnormal results are displayed) Labs Reviewed  COMPREHENSIVE METABOLIC PANEL - Abnormal; Notable for the following components:      Result Value   Potassium 3.2 (*)    All other components within normal limits  RAPID URINE DRUG SCREEN, HOSP PERFORMED - Abnormal; Notable for the following components:   Benzodiazepines POSITIVE (*)    All other components within normal limits  URINALYSIS, ROUTINE W REFLEX MICROSCOPIC - Abnormal; Notable for the following components:   APPearance HAZY (*)    Glucose, UA >=500 (*)    Ketones, ur 5 (*)    Leukocytes,Ua TRACE (*)    All other components within normal limits  CBC WITH DIFFERENTIAL/PLATELET - Abnormal; Notable for the following components:   Monocytes Absolute 1.1 (*)    All other components within normal limits  LACTIC ACID, PLASMA - Abnormal; Notable for the following  components:   Lactic Acid, Venous 2.6 (*)    All other components within normal limits  I-STAT CHEM 8, ED - Abnormal; Notable for the following components:   Potassium 3.4 (*)    Calcium, Ion 1.11 (*)    TCO2 21 (*)    All other components within normal limits  CULTURE, BLOOD (ROUTINE X 2)  CULTURE, BLOOD (ROUTINE X 2)  ETHANOL  PROTIME-INR  APTT  TSH  LACTIC ACID, PLASMA  AMMONIA  VITAMIN B12  VITAMIN B1  Radiology DG Chest Port 1 View  Result Date: 11/05/2022 CLINICAL DATA:  Weakness, coronary artery disease, COPD EXAM: PORTABLE CHEST 1 VIEW COMPARISON:  11/01/2022 FINDINGS: The lungs are symmetrically well expanded. No pneumothorax or pleural effusion. Mild progressive central pulmonary vascular congestion and trace diffuse interstitial pulmonary edema, likely cardiogenic in nature, are new since prior examination. Cardiac size is within normal limits. Tortuosity and ectasia of the thoracic aorta is noted, better assessed on CT examination of 11/01/2022. Left subclavian dual lead pacemaker is unchanged. IMPRESSION: 1. Developing, mild cardiogenic failure. 2. Tortuosity and ectasia of the thoracic aorta. Electronically Signed   By: Fidela Salisbury M.D.   On: 11/05/2022 20:04   CT ANGIO HEAD NECK W WO CM W PERF (CODE STROKE)  Result Date: 11/05/2022 CLINICAL DATA:  Neuro deficit with acute stroke suspected. Right-sided weakness and facial droop EXAM: CT ANGIOGRAPHY HEAD AND NECK CT PERFUSION BRAIN TECHNIQUE: Multidetector CT imaging of the head and neck was performed using the standard protocol during bolus administration of intravenous contrast. Multiplanar CT image reconstructions and MIPs were obtained to evaluate the vascular anatomy. Carotid stenosis measurements (when applicable) are obtained utilizing NASCET criteria, using the distal internal carotid diameter as the  denominator. Multiphase CT imaging of the brain was performed following IV bolus contrast injection. Subsequent parametric perfusion maps were calculated using RAPID software. RADIATION DOSE REDUCTION: This exam was performed according to the departmental dose-optimization program which includes automated exposure control, adjustment of the mA and/or kV according to patient size and/or use of iterative reconstruction technique. CONTRAST:  146mL OMNIPAQUE IOHEXOL 350 MG/ML SOLN COMPARISON:  Head CT from earlier today. FINDINGS: Aortic arch: Atheromatous plaque.  Three vessel branching Right carotid system: Atheromatous plaque at the bifurcation. High-grade stenosis is suspected although there is limited quantification due to more shin artifact. Narrowing is over 50%. Left carotid system: Atheromatous plaque at the common carotid origin and at the bifurcation without flow reducing stenosis or ulceration. Vertebral arteries: Calcified plaque at the proximal right subclavian causes 70% stenosis as measured on coronal reformats. Advanced narrowing at the right vertebral origin due to atheromatous plaque. Moderate narrowing at the left vertebral origin. No dissection or beading. Skeleton: Ordinary cervical spine degeneration Other neck: No acute finding Upper chest: Emphysema. Review of the MIP images confirms the above findings CTA HEAD FINDINGS Anterior circulation: Limited by motion. Extensive atheromatous calcification of the cavernous carotids. There may be significant narrowing at the right MCA bifurcation, although degraded by motion. No emergent large vessel occlusion Posterior circulation: Right dominant vertebral artery. Before atheromatous plaque with high-grade narrowing affecting the right more than left vertebral artery. Moderately extensive atheromatous irregularity of the basilar. Severe left P1 segment stenosis with downstream underfilling. Bilateral PCA atheromatous irregularity is extensive. Venous  sinuses: Unremarkable. CT Brain Perfusion Findings: ASPECTS: 10 CBF (<30%) Volume: 23mL Perfusion (Tmax>6.0s) volume: 83mL There is notable motion artifact. IMPRESSION: 1. Limited study due to motion artifact. 2. Severe narrowing at the left P1 segment. On coronal thick MIPS there is question of luminal filling defect but given the degree of motion this may instead be atheromatous. 3. Right ICA origin stenosis that is likely flow reducing with possible downstream underfilling. 4. 70% narrowing at the right subclavian origin. 5. High-grade narrowing of the right vertebral artery at its origin and vertebrobasilar junction. Advanced narrowing of the non dominant left V4 segment as well. 6. Moderate distal basilar stenosis. 7. Severe PCA atheromatous irregularity. 8. Noncontributory CT perfusion. Electronically Signed   By: Roderic Palau  Watts M.D.   On: 11/05/2022 19:26   CT Cervical Spine Wo Contrast  Result Date: 11/05/2022 CLINICAL DATA:  Trauma, multiple falls EXAM: CT CERVICAL SPINE WITHOUT CONTRAST TECHNIQUE: Multidetector CT imaging of the cervical spine was performed without intravenous contrast. Multiplanar CT image reconstructions were also generated. RADIATION DOSE REDUCTION: This exam was performed according to the departmental dose-optimization program which includes automated exposure control, adjustment of the mA and/or kV according to patient size and/or use of iterative reconstruction technique. COMPARISON:  11/01/2022 FINDINGS: There is patient motion in some of the images limiting the study. Alignment: Alignment of posterior margins of vertebral bodies is within normal limits. Skull base and vertebrae: No recent fracture is seen. Soft tissues and spinal canal: Posterior bony spurs causing mild extrinsic pressure on the ventral margin of thecal sac from C3 to C6 levels. Disc levels: There is minimal encroachment of right neural foramen at C2-C3 level. There is mild encroachment of neural foramina from  C3-C7 levels. Upper chest: Linear patchy pleural densities are seen in both apices. Other: Thyroid is enlarged with inhomogeneous attenuation. IMPRESSION: Motion limited study. No recent fracture is seen in cervical spine. Cervical spondylosis with encroachment of neural foramina at multiple levels. There is mild extrinsic pressure over the ventral margin of thecal sac caused by posterior bony spurs without significant central spinal stenosis. Thyroid is enlarged with inhomogeneous attenuation. Nonemergent thyroid sonogram may be considered. Electronically Signed   By: Elmer Picker M.D.   On: 11/05/2022 19:17   CT HEAD CODE STROKE WO CONTRAST  Result Date: 11/05/2022 CLINICAL DATA:  Code stroke. Neuro deficit with acute stroke suspected EXAM: CT HEAD WITHOUT CONTRAST TECHNIQUE: Contiguous axial images were obtained from the base of the skull through the vertex without intravenous contrast. RADIATION DOSE REDUCTION: This exam was performed according to the departmental dose-optimization program which includes automated exposure control, adjustment of the mA and/or kV according to patient size and/or use of iterative reconstruction technique. COMPARISON:  Head CT 11/01/2022 FINDINGS: Brain: No evidence of acute infarction, hemorrhage, hydrocephalus, extra-axial collection or mass lesion/mass effect. Chronic small vessel ischemia with chronic perforator infarct at the left corona radiata. Vascular: No hyperdense vessel or unexpected calcification. Skull: Normal. Negative for fracture or focal lesion. Sinuses/Orbits: No acute finding. Other: Prelim sent in epic chat. ASPECTS [facility] Stroke Program Early CT Score) Not scored without localizing history. IMPRESSION: No acute or interval finding. Electronically Signed   By: Jorje Guild M.D.   On: 11/05/2022 18:32    Pertinent labs & imaging results that were available during my care of the patient were reviewed by me and considered in my medical decision  making (see MDM for details).  Medications Ordered in ED Medications  LORazepam (ATIVAN) injection 1 mg (1 mg Intravenous Given 11/05/22 1917)  iohexol (OMNIPAQUE) 350 MG/ML injection 100 mL (100 mLs Intravenous Contrast Given 11/05/22 1858)  lactated ringers bolus 1,000 mL (0 mLs Intravenous Stopped 11/05/22 2233)  QUEtiapine (SEROQUEL) tablet 50 mg (50 mg Oral Given 11/05/22 2237)  Procedures Procedures  (including critical care time)  Medical Decision Making / ED Course   MDM:  87 year old female presenting to the emergency department as a code stroke for right-sided weakness.  On exam, patient has may be some slight facial droop, does seem pretty confused.  Was evaluated by teleneurology, did not feel patient met tPA criteria.  Does not have LVO on CTA.  Has had some recent falls but no intracranial bleeding, no cervical spine fracture, trauma exam otherwise without focal abnormality.  Laboratory testing notable for lactic acidosis.  Otherwise, no signs of urinary infection, chest x-ray clear with no signs of pneumonia, TSH and ammonia reassuring.  Lactic acid improved on recheck.  Blood cultures obtained but antibiotics deferred given no fever.  Unclear cause of patient's symptoms.  Could be due to underlying psychiatric disorder, or delirium.  Neurology recommends given right-sided weakness obtaining repeat CT head in 24 hours to evaluate for interval changes.  Patient may not be eligible for MRI given pacemaker.  Discussed with the hospitalist to admit for further monitoring.      Additional history obtained: -Additional history obtained from family and ems -External records from outside source obtained and reviewed including: Chart review including previous notes, labs, imaging, consultation notes including recent ER visit, admission for pneumonia in late  march   Lab Tests: -I ordered, reviewed, and interpreted labs.   The pertinent results include:   Labs Reviewed  COMPREHENSIVE METABOLIC PANEL - Abnormal; Notable for the following components:      Result Value   Potassium 3.2 (*)    All other components within normal limits  RAPID URINE DRUG SCREEN, HOSP PERFORMED - Abnormal; Notable for the following components:   Benzodiazepines POSITIVE (*)    All other components within normal limits  URINALYSIS, ROUTINE W REFLEX MICROSCOPIC - Abnormal; Notable for the following components:   APPearance HAZY (*)    Glucose, UA >=500 (*)    Ketones, ur 5 (*)    Leukocytes,Ua TRACE (*)    All other components within normal limits  CBC WITH DIFFERENTIAL/PLATELET - Abnormal; Notable for the following components:   Monocytes Absolute 1.1 (*)    All other components within normal limits  LACTIC ACID, PLASMA - Abnormal; Notable for the following components:   Lactic Acid, Venous 2.6 (*)    All other components within normal limits  I-STAT CHEM 8, ED - Abnormal; Notable for the following components:   Potassium 3.4 (*)    Calcium, Ion 1.11 (*)    TCO2 21 (*)    All other components within normal limits  CULTURE, BLOOD (ROUTINE X 2)  CULTURE, BLOOD (ROUTINE X 2)  ETHANOL  PROTIME-INR  APTT  TSH  LACTIC ACID, PLASMA  AMMONIA  VITAMIN B12  VITAMIN B1    Notable for mild hypokalemia, mild elevation in lactic acid, no signs of UTI, normal alcohol, normal ammonia, normal TSH  EKG   EKG Interpretation  Date/Time:  Wednesday November 05 2022 19:06:50 EDT Ventricular Rate:  101 PR Interval:  116 QRS Duration: 138 QT Interval:  286 QTC Calculation: 375 R Axis:   0 Text Interpretation: Atrial-paced rhythm Nonspecific intraventricular conduction delay Artifact in lead(s) I II III aVR aVL aVF Interpretation limited secondary to artifact Confirmed by Garnette Gunner [phone]) on 11/05/2022 7:22:20 PM         Imaging Studies ordered: I ordered  imaging studies including CT head, CXR On my interpretation imaging demonstrates no acute process I independently visualized and  interpreted imaging. I agree with the radiologist interpretation   Medicines ordered and prescription drug management: Meds ordered this encounter  Medications   LORazepam (ATIVAN) injection 1 mg   iohexol (OMNIPAQUE) 350 MG/ML injection 100 mL   LORazepam (ATIVAN) 2 MG/ML injection    Eanes, Morgan P: cabinet override   lactated ringers bolus 1,000 mL   QUEtiapine (SEROQUEL) tablet 50 mg    -I have reviewed the patients home medicines and have made adjustments as needed   Consultations Obtained: I requested consultation with the neurologist,  and discussed lab and imaging findings as well as pertinent plan - they recommend: repeat head CT in 24 hours   Cardiac Monitoring: The patient was maintained on a cardiac monitor.  I personally viewed and interpreted the cardiac monitored which showed an underlying rhythm of: NSR  Social Determinants of Health:  Diagnosis or treatment significantly limited by social determinants of health: lives alone   Reevaluation: After the interventions noted above, I reevaluated the patient and found that their symptoms have improved  Co morbidities that complicate the patient evaluation  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    Final Clinical Impression(s) / ED Diagnoses Final diagnoses:  Right sided weakness  Confusion  Lactic acidosis     This chart was dictated using voice recognition software.  Despite best efforts to proofread,  errors can occur which can change the documentation meaning.    Cristie Hem, MD 11/05/22 2259

## 2022-11-05 NOTE — ED Notes (Signed)
Placed pt on bed alarm pad.  Tolerated well.

## 2022-11-05 NOTE — ED Notes (Signed)
Assumed care of patient. Patient is alert to her name and birthday. Patient can follow some commands. Patient has intermittent outburst of song and words. Patient also appears to be uncontrollable seizure like movements.

## 2022-11-05 NOTE — ED Triage Notes (Signed)
Pt has right sided weakness. Lkn [id number].

## 2022-11-05 NOTE — ED Notes (Signed)
Per teleneurologist, pt is not a candidate for TNK/LVO

## 2022-11-05 NOTE — Consult Note (Signed)
[facility] Telemedicine Consult   Requesting Provider: Dr Viona Gilmore. Truett Mainland Consult Participants: Dr. Jerelyn Charles, Telespecialist RN [place]   Bedside RN more than Location of the provider: [facility] Location of the patient: [facility] 05 This consult was provided via telemedicine with 2-way video and audio communication. The patient/family was informed that care would be provided in this way and agreed to receive care in this manner.   Chart and granddaughter were the source of history along with details given by EMS  Chief Complaint: Initial chief complaint was right-sided facial droop and weakness, inability to follow commands.  Later on-complaints by the family is frequent falls-4 falls today  HPI: 87 year old woman past medical history of atrial fibrillation, coronary artery disease, chronic back pain, COPD, tremors at baseline, tachycardia-bradycardia syndrome status post pacemaker placement-not on anticoagulation due to patient declining anticoagulation, presenting to the emergency room today with multiple complaints. According to the granddaughter who saw her last in her usual state of health when she spoke with her on the phone was 8:30 PM yesterday.  She checked on her at 10 AM and found her to be sitting in her chair with her Bible only mumbling.  She did not make much of it, she got a call from the doctor's office and while she took the call the patient had a fall.  This was her third or fourth fall for the day.  EMS was called but the patient was with enough to refuse transport to the hospital.  Later on it was noted that she is having trouble talking and following commands and EMS was called again and they brought her in with concerns for LVO positive code stroke. Daughter-in-law-April Blanca Friend was contacted later.  She was very tearful and extremely flustered with her grandmother being in the hospital and very unhappy that she was discharged a few days back.  She said that her grandmother  was not feeling well for the past few days but was still talking and able to walk. She reports that the patient lives alone, but needs help with every single ADL, which the granddaughter does for her every day.   Last admission note: Admitted for hypertensive urgency and paranoid delusion disorder along with community-acquired pneumonia.  No indication for inpatient psychiatric admission and was discharged home.  Also says suspected pancreatic cancer that needed outpatient workup.   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    Essential hypertension    GERD (gastroesophageal reflux disease)    H/O hiatal hernia    Headache(784.0)    HOH (hard of hearing)    Hyperlipidemia    Neuromuscular disorder    Tremors   Pericardial effusion    Hemorrhagic    Presence of permanent cardiac pacemaker    Tachycardia-bradycardia syndrome    s/p Medtronic Adapta L dual chamber device  5/10     Current Facility-Administered Medications:    LORazepam (ATIVAN) 2 MG/ML injection, , , ,    LORazepam (ATIVAN) injection 1 mg, 1 mg, Intravenous, Once, Amie Portland, MD  Current Outpatient Medications:    albuterol (PROVENTIL) (2.5 MG/3ML) 0.083% nebulizer solution, Take 3 mLs (2.5 mg total) by nebulization every 4 (four) hours as needed  for wheezing or shortness of breath., Disp: 75 mL, Rfl: 2   ALPRAZolam (XANAX) 1 MG tablet, Take 1 mg by mouth 3 (three) times daily as needed for anxiety., Disp: , Rfl:    amiodarone (PACERONE) 200 MG tablet, TAKE 1 TABLET ONCE DAILY ON MONDAY THROUGH SATURDAY, NONE ON SUNDAY. (Patient taking differently: Take 200 mg by mouth See admin instructions. TAKE 1 TABLET ONCE DAILY ON MONDAY THROUGH SATURDAY, NONE ON SUNDAY.), Disp: 72  tablet, Rfl: 0   amLODipine (NORVASC) 5 MG tablet, Take 1 tablet (5 mg total) by mouth daily., Disp: 30 tablet, Rfl: 4   aspirin EC 81 MG tablet, Take 1 tablet (81 mg total) by mouth daily with breakfast. Swallow whole., Disp: 30 tablet, Rfl: 12   Cyanocobalamin (VITAMIN B-12) 5000 MCG SUBL, Take 5,000 mcg by mouth daily., Disp: , Rfl:    dicyclomine (BENTYL) 10 MG capsule, Take 1 capsule (10 mg total) by mouth 2 (two) times daily as needed for spasms., Disp: 60 capsule, Rfl: 3   furosemide (LASIX) 40 MG tablet, Take 1 tablet (40 mg total) by mouth daily as needed for fluid or edema. (Patient taking differently: Take 20 mg by mouth daily.), Disp: 30 tablet, Rfl: 0   isosorbide mononitrate (IMDUR) 30 MG 24 hr tablet, Take 1 tablet (30 mg total) by mouth daily., Disp: 90 tablet, Rfl: 5   JARDIANCE 25 MG TABS tablet, Take 25 mg by mouth daily., Disp: , Rfl:    metoprolol succinate (TOPROL-XL) 50 MG 24 hr tablet, Take 1 tablet (50 mg total) by mouth 2 (two) times daily. Take with or immediately following a meal., Disp: 60 tablet, Rfl: 4   Multiple Vitamin (MULTIVITAMIN WITH MINERALS) TABS tablet, Take 1 tablet by mouth daily. , Disp: , Rfl:    nitroGLYCERIN (NITROSTAT) 0.4 MG SL tablet, Place 1 tablet (0.4 mg total) under the tongue every 5 (five) minutes x 3 doses as needed for chest pain., Disp: 30 tablet, Rfl: 12   ondansetron (ZOFRAN) 4 MG tablet, Take 4 mg by mouth every 6 (six) hours., Disp: , Rfl:    OXYGEN, Inhale 2 L into the lungs as needed (PRN as needed at night)., Disp: , Rfl:    potassium chloride (KLOR-CON) 10 MEQ tablet, Take 1 tablet (10 mEq total) by mouth daily. Take While taking Lasix/furosemide, Disp: 30 tablet, Rfl: 2   primidone (MYSOLINE) 50 MG tablet, Take 50 mg by mouth at bedtime., Disp: , Rfl:    PROAIR HFA 108 (90 Base) MCG/ACT inhaler, Inhale 1 puff into the lungs every 4 (four) hours as needed for wheezing or shortness of breath. , Disp: , Rfl:    ranolazine (RANEXA) 500 MG  12 hr tablet, Take 1 tablet (500 mg total) by mouth 2 (two) times daily., Disp: 60 tablet, Rfl: 8   rosuvastatin (CRESTOR) 40 MG tablet, Take 1 tablet (40 mg total) by mouth daily., Disp: 90 tablet, Rfl: 3   senna-docusate (SENOKOT-S) 8.6-50 MG tablet, Take 1 tablet by mouth at bedtime. (Patient taking differently: Take 1-2 tablets by mouth daily as needed (constipation.).), Disp: 30 tablet, Rfl: 0   triamcinolone cream (KENALOG) 0.1 %, Apply 1 Application topically daily as needed (for rash/skin irritation)., Disp: , Rfl:    vitamin E 180 MG (400 UNITS) capsule, Take 400 Units by mouth daily., Disp: , Rfl:     LKW: Unclear-either last night or this morning but beyond the [facility] window. tpa given?:  Outside the window IR Thrombectomy? No, no  ELVO Modified Rankin Scale: 3-Moderate disability-requires help but walks WITHOUT assistance Time of teleneurologist evaluation: 1815 hrs.  Exam: I was told on verbal report that she was not hypertensive on arrival.  General:  Awake alert tremulousness of the face and arms addressed. Mumbling, moving all 4 extremities, following simple commands, able to tell her name, dysarthria present.  No clear evidence of aphasia, poor attention concentration.   NIHSS 1A: Level of Consciousness - 0 1B: Ask Month and Age - 1 1C: 'Blink Eyes' & 'Squeeze Hands' - 0 2: Test Horizontal Extraocular Movements - 0 3: Test Visual Fields - 0 4: Test Facial Palsy - 0 5A: Test Left Arm Motor Drift - 0 5B: Test Right Arm Motor Drift - 0 6A: Test Left Leg Motor Drift - 1 6B: Test Right Leg Motor Drift - 1 7: Test Limb Ataxia - 0 8: Test Sensation - 0 9: Test Language/Aphasia- 0 10: Test Dysarthria - 1 11: Test Extinction/Inattention - 0 NIHSS score: 4   Imaging Reviewed: CT head without contrast-no acute changes CT angiography head and neck-delay in obtaining IV access and had to give patient Ativan due to her agitation-reviewed-no emergent LVO-formal read  pending CT perfusion studies-no perfusion deficit  Labs reviewed in epic and pertinent values follow: CBC    Component Value Date/Time   WBC 7.9 11/01/2022 0446   RBC 4.50 11/01/2022 0446   HGB 13.6 11/05/2022 1817   HCT 40.0 11/05/2022 1817   PLT 208 11/01/2022 0446   MCV 92.2 11/01/2022 0446   MCH 30.4 11/01/2022 0446   MCHC 33.0 11/01/2022 0446   RDW 14.4 11/01/2022 0446   LYMPHSABS 1.7 11/01/2022 0446   MONOABS 0.8 11/01/2022 0446   EOSABS 0.0 11/01/2022 0446   BASOSABS 0.0 11/01/2022 0446   CMP     Component Value Date/Time   NA 137 11/05/2022 1833   K 3.2 (L) 11/05/2022 1833   CL 103 11/05/2022 1833   CO2 23 11/05/2022 1833   GLUCOSE 98 11/05/2022 1833   BUN 15 11/05/2022 1833   CREATININE 0.84 11/05/2022 1833   CREATININE 0.62 04/08/2016 1140   CALCIUM 9.5 11/05/2022 1833   PROT 7.6 11/05/2022 1833   ALBUMIN 3.9 11/05/2022 1833   AST 33 11/05/2022 1833   ALT 23 11/05/2022 1833   ALKPHOS 80 11/05/2022 1833   BILITOT 1.1 11/05/2022 1833   GFRNONAA >60 11/05/2022 1833   GFRAA >60 05/01/2020 1109     Assessment: 87 year old with above past medical history presenting for evaluation of what sounds like altered mental status and frequent falls-4 falls since this morning followed by inability to follow commands.  On my examination on the camera within its constraints, she was unable to follow simple commands.  She did not have any focal deficits-has generalized weakness of both legs that is very symmetric. She keeps shaking her head and tremulousness movements, some of which are listed as baseline. I am not sure if the underlying etiology of her presentation is truly neurological or is more likely behavioral. Irrespective, CT head was done stat-no acute changes.  CT angiography head and neck and CT perfusion study done stat-lots of motion and technical limitations due to patient not cooperative-no emergent LVO.  No perfusion deficit.  Impression Multifactorial toxic  metabolic encephalopathy Question underlying dementia with acute worsening in the setting of recent hospitalization and infection Evaluate for underlying delusional disorder in the setting of above   Recommendations:  At this point, I do not think she needs any  further inpatient neurological workup.  The only thing I can recommend is to obtain a CT head in 24 hours and to see if there is any evidence of evolving stroke but it does not appear to be anything that needs emergent intervention. Her pacemaker might not be MRI compatible-I do not see any MRIs on her since 2006 and her pacemaker was initially put in 2010 and then swapped out again later. I would recommend checking chest x-ray, UA, basic labs.  I would also recommend checking TSH, B12, thiamine levels. Ultimately I think she needs a detailed psychiatric evaluation to see what is it that she would need that would best serve her from her healthcare needs perspective. Formal outpatient neuropsychological testing-refer to outpatient neurology in 8 to 12 weeks.  Plan was discussed with Dr. Garnette Gunner via secure chat.   -- Amie Portland, MD Neurologist [facility] Pager: [phone]

## 2022-11-05 NOTE — Patient Instructions (Signed)
Visit Information  Thank you for taking time to visit with me today. Please don't hesitate to contact me if I can be of assistance to you.   Following are the goals we discussed today:   Goals Addressed             This Visit's Progress    Assist with [place].   On track    Care Coordination Interventions:  Interventions Today    Flowsheet Row Most Recent Value  Chronic Disease   Chronic disease during today's visit Other  [Generalized Anxiety Disorder, Caregiver Stress/Burnout]  General Interventions   General Interventions Discussed/Reviewed General Interventions Discussed, Labs, Vaccines, Doctor Visits, Health Screening, General Interventions Reviewed, Annual Eye Exam, Durable Medical Equipment (DME), Community Resources, Level of Care, Communication with  [Communication with Primary Care Provider]  Vaccines COVID-19, Flu, Pneumonia, RSV, Shingles, Tetanus/Pertussis/Diphtheria  [Encouraged]  Doctor Visits Discussed/Reviewed Doctor Visits Discussed, Specialist, Doctor Visits Reviewed, Annual Wellness Visits, PCP  [Encouraged]  Health Screening Bone Density, Colonoscopy, Mammogram  [Encouraged]  Durable Medical Equipment (DME) Shower bench, BP Cuff, Walker  PCP/Specialist Visits Compliance with follow-up visit  [Encouraged]  Communication with PCP/Specialists, RN  Level of Care Adult Daycare, Personal Care Services, Applications, Assisted Living, [place]  [Encouraged]  Applications Medicaid, Personal Care Services, [place]-2  Exercise Interventions   Exercise Discussed/Reviewed Exercise Discussed, Assistive device use and maintanence, Exercise Reviewed, Physical Activity, Weight Managment  [Encouraged]  Physical Activity Discussed/Reviewed Physical Activity Discussed, Home Exercise Program (HEP), Physical Activity Reviewed, Types of exercise  [Encouraged]  Weight Management Weight maintenance  [Encouraged]  Education Interventions   Education  Provided Provided Engineer, site, Provided Web-based Education, Provided Education  Provided Verbal Education On Nutrition, Mental Health/Coping with Illness, When to see the doctor, [employer], [employer], Medication, Exercise, Applications, Foot Care, Eye Care, Labs  [Encouraged]  Applications Medicaid, Personal Care Services, [place] Discussed, Anxiety, Depression, Grief and Loss, Substance Abuse, Suicide, Other, Mental Health Reviewed, Coping Strategies, Crisis  [Domestic Violence]  Nutrition Interventions   Nutrition Discussed/Reviewed Nutrition Discussed, Adding fruits and vegetables, Increaing proteins, Decreasing fats, Decreasing salt, Supplmental nutrition, Decreasing sugar intake, Carbohydrate meal planning, Nutrition Reviewed, Fluid intake, Portion sizes  [Encouraged]  Pharmacy Interventions   Pharmacy Dicussed/Reviewed Pharmacy Topics Discussed, Pharmacy Topics Reviewed, Medications and their functions, Medication Adherence, Affording Medications  [Encouraged]  Safety Interventions   Safety Discussed/Reviewed Safety Discussed, Safety Reviewed, Fall Risk, Home Safety  [Encouraged]  Home Safety Assistive Devices, Need for home safety assessment, Refer for community resources  Advanced Directive Interventions   Advanced Directives Discussed/Reviewed Advanced Directives Discussed, Advanced Directives Reviewed     Assessed Social Determinant of Health Barriers. Discussed Plans for Ongoing Care Management Follow Up. Provided Tree surgeon Information for Care Management Team Members. Screened for Signs & Symptoms of Depression, Related to Chronic Disease State.  PHQ2 & PHQ9 Depression Screen Completed & Results Reviewed.  Suicidal Ideation & Homicidal Ideation Assessed - None Present.   Domestic Violence Assessed - None Present. Access to Weapons Assessed - None Present.   Active Listening &  Reflection Utilized.  Verbalization of Feelings Encouraged.  Emotional Support Provided. Feelings of Caregiver Burnout Validated. Caregiver Stress Acknowledged. Caregiver Resources Reviewed. Caregiver Support Groups Mailed. Self-Enrollment in Caregiver Support Group of Interest Emphasized. Crisis Support Information, Agencies, Services & Resources Discussed. Problem Solving Interventions Identified. Task-Centered Solutions Implemented.   Solution-Focused Strategies Developed. Acceptance & Commitment Therapy Introduced. Brief  Cognitive Behavioral Therapy Initiated. Client-Centered Therapy Enacted. Reviewed Prescription Medications & Discussed Importance of Compliance. Quality of Sleep Assessed & Sleep Hygiene Techniques Promoted. CSW Collaboration with Granddaughter, [name] to Discuss Higher Level of Care Options (I.e. [place], [place]) & Encourage Consideration. CSW Collaboration with Granddaughter, [name] to [employer] No [place]., Covered Under Teaching laboratory technician through [employer].  CSW Collaboration with Granddaughter, [name] to Verify No Long-Term [employer], [employer], Plans, Coverage, Etc.  CSW Collaboration with Granddaughter, [name] to Confirm Patient, Nor Deceased Husband Were Veterans, Making Patient Ineligible to Apply for Aid & Attendance Benefits, Through [employer]. CSW Collaboration with Granddaughter, [name] to Request Review of The Following List of Express Scripts, [employer], [employer], Mailed on 11/05/2022: ~ Adult Day Care Programs  ~ [place] ~ [place] ~ [place] ~ [place] Application  ~ [place] Bar Providers ~ 2023 Medicaid Tips ~ [employer] [place] with Granddaughter, [name] to Environmental health practitioner with Express Scripts, [place], in An Effort to [place] for Patient in [place]. CSW Collaboration with Granddaughter, [name] to Encourage Completion of Application for [employer], through [employer] [phone]), Once Patient is Approved for Full Medicaid Coverage & Benefits, through [employer] [phone]). CSW Collaboration with Granddaughter, [name] to Confirm Patient's Disinterest in Receiving [place], through [facility] [phone]). CSW Collaboration with Primary Care Provider, Dr. Allyn Kenner (# [phone]), to Request Referral for Neurology Consult, Per Granddaughter, [name]'s Request. [place] with Granddaughter, [name] to [employer] with CSW (# [phone]), if She Has Questions, Needs Assistance, or If Additional Social Work Needs Are Identified Between Now & Our Next Scheduled Telephone [employer].      Our next appointment is by telephone on  11/19/2022 at 9:00 am.   Please call the care guide team at [phone] if you need to cancel or reschedule your appointment.   If you are experiencing a Mental Health or [place] or need someone to talk to, please call the Suicide and Crisis Lifeline: 988 call the [place] National Suicide Prevention Lifeline: 951-319-7820 or TTY: 845-521-6356 TTY 431 312 7976) to talk to a trained counselor call 1-800-273-TALK (toll free, 24 hour hotline) go to [facility] [address] [place] [phone]) call the [place]: [phone] call 911  Patient verbalizes  understanding of instructions and care plan provided today and agrees to view in [place]. Active MyChart status and patient understanding of how to access instructions and care plan via MyChart confirmed with patient.     Telephone follow up appointment with care management team member scheduled for:   11/19/2022 at 9:00 am.   Nat Christen, BSW, MSW, [place]  Licensed Clinical Social Worker  [place]  Mailing [place]. [address] [place], [place] [address] Physical Address[address] [place], [place] [address] Toll Free Main # [phone] Fax # [phone] Cell # [phone] [email or web address]

## 2022-11-06 ENCOUNTER — Encounter: Payer: Self-pay | Admitting: *Deleted

## 2022-11-06 ENCOUNTER — Ambulatory Visit: Payer: Self-pay | Admitting: *Deleted

## 2022-11-06 ENCOUNTER — Observation Stay (HOSPITAL_COMMUNITY): Payer: 59

## 2022-11-06 DIAGNOSIS — F22 Delusional disorders: Secondary | ICD-10-CM | POA: Diagnosis present

## 2022-11-06 DIAGNOSIS — E876 Hypokalemia: Secondary | ICD-10-CM | POA: Diagnosis present

## 2022-11-06 DIAGNOSIS — E872 Acidosis, unspecified: Secondary | ICD-10-CM | POA: Diagnosis not present

## 2022-11-06 DIAGNOSIS — R531 Weakness: Secondary | ICD-10-CM | POA: Diagnosis not present

## 2022-11-06 DIAGNOSIS — Z79899 Other long term (current) drug therapy: Secondary | ICD-10-CM | POA: Diagnosis not present

## 2022-11-06 DIAGNOSIS — K862 Cyst of pancreas: Secondary | ICD-10-CM | POA: Diagnosis not present

## 2022-11-06 DIAGNOSIS — T82855A Stenosis of coronary artery stent, initial encounter: Secondary | ICD-10-CM | POA: Diagnosis present

## 2022-11-06 DIAGNOSIS — Y831 Surgical operation with implant of artificial internal device as the cause of abnormal reaction of the patient, or of later complication, without mention of misadventure at the time of the procedure: Secondary | ICD-10-CM | POA: Diagnosis present

## 2022-11-06 DIAGNOSIS — R410 Disorientation, unspecified: Secondary | ICD-10-CM | POA: Diagnosis present

## 2022-11-06 DIAGNOSIS — I160 Hypertensive urgency: Secondary | ICD-10-CM | POA: Diagnosis present

## 2022-11-06 DIAGNOSIS — F419 Anxiety disorder, unspecified: Secondary | ICD-10-CM | POA: Diagnosis present

## 2022-11-06 DIAGNOSIS — Z66 Do not resuscitate: Secondary | ICD-10-CM | POA: Diagnosis not present

## 2022-11-06 DIAGNOSIS — D490 Neoplasm of unspecified behavior of digestive system: Secondary | ICD-10-CM | POA: Diagnosis not present

## 2022-11-06 DIAGNOSIS — G9341 Metabolic encephalopathy: Secondary | ICD-10-CM | POA: Insufficient documentation

## 2022-11-06 DIAGNOSIS — R000 Tachycardia, unspecified: Secondary | ICD-10-CM | POA: Diagnosis not present

## 2022-11-06 DIAGNOSIS — R2689 Other abnormalities of gait and mobility: Secondary | ICD-10-CM | POA: Diagnosis not present

## 2022-11-06 DIAGNOSIS — J449 Chronic obstructive pulmonary disease, unspecified: Secondary | ICD-10-CM | POA: Diagnosis not present

## 2022-11-06 DIAGNOSIS — I2510 Atherosclerotic heart disease of native coronary artery without angina pectoris: Secondary | ICD-10-CM

## 2022-11-06 DIAGNOSIS — Z87891 Personal history of nicotine dependence: Secondary | ICD-10-CM | POA: Diagnosis not present

## 2022-11-06 DIAGNOSIS — R627 Adult failure to thrive: Secondary | ICD-10-CM | POA: Diagnosis not present

## 2022-11-06 DIAGNOSIS — R55 Syncope and collapse: Secondary | ICD-10-CM

## 2022-11-06 DIAGNOSIS — I480 Paroxysmal atrial fibrillation: Secondary | ICD-10-CM

## 2022-11-06 DIAGNOSIS — Z8249 Family history of ischemic heart disease and other diseases of the circulatory system: Secondary | ICD-10-CM | POA: Diagnosis not present

## 2022-11-06 DIAGNOSIS — J439 Emphysema, unspecified: Secondary | ICD-10-CM | POA: Diagnosis not present

## 2022-11-06 DIAGNOSIS — I6381 Other cerebral infarction due to occlusion or stenosis of small artery: Secondary | ICD-10-CM | POA: Diagnosis not present

## 2022-11-06 DIAGNOSIS — W1830XA Fall on same level, unspecified, initial encounter: Secondary | ICD-10-CM | POA: Diagnosis present

## 2022-11-06 DIAGNOSIS — I495 Sick sinus syndrome: Secondary | ICD-10-CM | POA: Diagnosis not present

## 2022-11-06 DIAGNOSIS — I482 Chronic atrial fibrillation, unspecified: Secondary | ICD-10-CM | POA: Diagnosis not present

## 2022-11-06 DIAGNOSIS — E785 Hyperlipidemia, unspecified: Secondary | ICD-10-CM | POA: Diagnosis not present

## 2022-11-06 DIAGNOSIS — M6281 Muscle weakness (generalized): Secondary | ICD-10-CM | POA: Diagnosis not present

## 2022-11-06 DIAGNOSIS — R4189 Other symptoms and signs involving cognitive functions and awareness: Secondary | ICD-10-CM | POA: Diagnosis present

## 2022-11-06 DIAGNOSIS — R5381 Other malaise: Secondary | ICD-10-CM | POA: Diagnosis not present

## 2022-11-06 DIAGNOSIS — I110 Hypertensive heart disease with heart failure: Secondary | ICD-10-CM | POA: Diagnosis not present

## 2022-11-06 DIAGNOSIS — I700 Atherosclerosis of aorta: Secondary | ICD-10-CM | POA: Diagnosis not present

## 2022-11-06 DIAGNOSIS — Z743 Need for continuous supervision: Secondary | ICD-10-CM | POA: Diagnosis not present

## 2022-11-06 DIAGNOSIS — I5022 Chronic systolic (congestive) heart failure: Secondary | ICD-10-CM | POA: Diagnosis not present

## 2022-11-06 DIAGNOSIS — I5032 Chronic diastolic (congestive) heart failure: Secondary | ICD-10-CM | POA: Diagnosis not present

## 2022-11-06 DIAGNOSIS — J9611 Chronic respiratory failure with hypoxia: Secondary | ICD-10-CM | POA: Diagnosis not present

## 2022-11-06 DIAGNOSIS — R296 Repeated falls: Secondary | ICD-10-CM | POA: Diagnosis present

## 2022-11-06 DIAGNOSIS — E860 Dehydration: Secondary | ICD-10-CM | POA: Diagnosis not present

## 2022-11-06 DIAGNOSIS — I4819 Other persistent atrial fibrillation: Secondary | ICD-10-CM | POA: Diagnosis not present

## 2022-11-06 DIAGNOSIS — I10 Essential (primary) hypertension: Secondary | ICD-10-CM | POA: Diagnosis not present

## 2022-11-06 LAB — CULTURE, BLOOD (ROUTINE X 2)
Culture: NO GROWTH
Culture: NO GROWTH
Special Requests: ADEQUATE
Special Requests: ADEQUATE

## 2022-11-06 LAB — CBC
HCT: 36.4 % (ref 36.0–46.0)
Hemoglobin: 11.9 g/dL — ABNORMAL LOW (ref 12.0–15.0)
MCH: 30.7 pg (ref 26.0–34.0)
MCHC: 32.7 g/dL (ref 30.0–36.0)
MCV: 94.1 fL (ref 80.0–100.0)
Platelets: 191 10*3/uL (ref 150–400)
RBC: 3.87 MIL/uL (ref 3.87–5.11)
RDW: 14.7 % (ref 11.5–15.5)
WBC: 6.7 10*3/uL (ref 4.0–10.5)
nRBC: 0 % (ref 0.0–0.2)

## 2022-11-06 LAB — COMPREHENSIVE METABOLIC PANEL
ALT: 20 U/L (ref 0–44)
AST: 26 U/L (ref 15–41)
Albumin: 3.2 g/dL — ABNORMAL LOW (ref 3.5–5.0)
Alkaline Phosphatase: 68 U/L (ref 38–126)
Anion gap: 8 (ref 5–15)
BUN: 11 mg/dL (ref 8–23)
CO2: 25 mmol/L (ref 22–32)
Calcium: 8.9 mg/dL (ref 8.9–10.3)
Chloride: 106 mmol/L (ref 98–111)
Creatinine, Ser: 0.66 mg/dL (ref 0.44–1.00)
GFR, Estimated: >60 mL/min (ref >60)
Glucose, Bld: 86 mg/dL (ref 70–99)
Potassium: 3 mmol/L — ABNORMAL LOW (ref 3.5–5.1)
Sodium: 139 mmol/L (ref 135–145)
Total Bilirubin: 1.3 mg/dL — ABNORMAL HIGH (ref 0.3–1.2)
Total Protein: 6.3 g/dL — ABNORMAL LOW (ref 6.5–8.1)

## 2022-11-06 LAB — BLOOD GAS, VENOUS
Acid-Base Excess: 6.4 mmol/L — ABNORMAL HIGH (ref 0.0–2.0)
Bicarbonate: 32.4 mmol/L — ABNORMAL HIGH (ref 20.0–28.0)
Drawn by: 7012
O2 Saturation: 40.3 %
Patient temperature: 36.4
pCO2, Ven: 49 mmHg (ref 44–60)
pH, Ven: 7.43 (ref 7.25–7.43)
pO2, Ven: <31 mmHg — CL (ref 32–45)

## 2022-11-06 LAB — PHOSPHORUS: Phosphorus: 3 mg/dL (ref 2.5–4.6)

## 2022-11-06 LAB — MAGNESIUM: Magnesium: 2 mg/dL (ref 1.7–2.4)

## 2022-11-06 LAB — BRAIN NATRIURETIC PEPTIDE: B Natriuretic Peptide: 354 pg/mL — ABNORMAL HIGH (ref 0.0–100.0)

## 2022-11-06 LAB — VITAMIN B12: Vitamin B-12: 2691 pg/mL — ABNORMAL HIGH (ref 180–914)

## 2022-11-06 LAB — PROCALCITONIN: Procalcitonin: <0.1 ng/mL

## 2022-11-06 MED ORDER — ACETAMINOPHEN 650 MG RE SUPP: 650.0000 mg | Freq: Four times a day (QID) | RECTAL | Status: DC | PRN

## 2022-11-06 MED ORDER — POTASSIUM CHLORIDE CRYS ER 20 MEQ PO TBCR
40.0000 meq | EXTENDED_RELEASE_TABLET | Freq: Once | ORAL | Status: AC
  Administered 2022-11-06: 40 meq via ORAL
  Filled 2022-11-06: qty 2

## 2022-11-06 MED ORDER — ONDANSETRON HCL 4 MG/2ML IJ SOLN: 4.0000 mg | Freq: Four times a day (QID) | INTRAMUSCULAR | Status: DC | PRN

## 2022-11-06 MED ORDER — ALBUTEROL SULFATE (2.5 MG/3ML) 0.083% IN NEBU: 2.5000 mg | INHALATION_SOLUTION | RESPIRATORY_TRACT | Status: DC | PRN

## 2022-11-06 MED ORDER — ASPIRIN 81 MG PO TBEC
81.0000 mg | DELAYED_RELEASE_TABLET | Freq: Every day | ORAL | Status: DC
  Administered 2022-11-06 – 2022-11-12 (×7): 81 mg via ORAL
  Filled 2022-11-06 (×7): qty 1

## 2022-11-06 MED ORDER — RANOLAZINE ER 500 MG PO TB12
500.0000 mg | ORAL_TABLET | Freq: Two times a day (BID) | ORAL | Status: DC
  Administered 2022-11-06 – 2022-11-12 (×12): 500 mg via ORAL
  Filled 2022-11-06 (×13): qty 1

## 2022-11-06 MED ORDER — TICAGRELOR 60 MG PO TABS
60.0000 mg | ORAL_TABLET | Freq: Two times a day (BID) | ORAL | Status: DC
  Administered 2022-11-06 – 2022-11-12 (×12): 60 mg via ORAL
  Filled 2022-11-06 (×17): qty 1

## 2022-11-06 MED ORDER — ONDANSETRON HCL 4 MG PO TABS: 4.0000 mg | ORAL_TABLET | Freq: Four times a day (QID) | ORAL | Status: DC | PRN

## 2022-11-06 MED ORDER — ISOSORBIDE MONONITRATE ER 60 MG PO TB24
30.0000 mg | ORAL_TABLET | Freq: Every day | ORAL | Status: DC
  Administered 2022-11-06 – 2022-11-12 (×7): 30 mg via ORAL
  Filled 2022-11-06 (×7): qty 1

## 2022-11-06 MED ORDER — AMIODARONE HCL 200 MG PO TABS
200.0000 mg | ORAL_TABLET | ORAL | Status: DC
  Administered 2022-11-06 – 2022-11-12 (×6): 200 mg via ORAL
  Filled 2022-11-06 (×7): qty 1

## 2022-11-06 MED ORDER — METOPROLOL SUCCINATE ER 50 MG PO TB24
50.0000 mg | ORAL_TABLET | Freq: Two times a day (BID) | ORAL | Status: DC
  Administered 2022-11-06 – 2022-11-12 (×11): 50 mg via ORAL
  Filled 2022-11-06 (×13): qty 1

## 2022-11-06 MED ORDER — ENOXAPARIN SODIUM 40 MG/0.4ML IJ SOSY
40.0000 mg | PREFILLED_SYRINGE | INTRAMUSCULAR | Status: DC
  Administered 2022-11-06 – 2022-11-10 (×5): 40 mg via SUBCUTANEOUS
  Filled 2022-11-06 (×7): qty 0.4

## 2022-11-06 MED ORDER — ALPRAZOLAM 1 MG PO TABS
1.0000 mg | ORAL_TABLET | Freq: Three times a day (TID) | ORAL | Status: DC | PRN
  Administered 2022-11-06 – 2022-11-12 (×13): 1 mg via ORAL
  Filled 2022-11-06 (×13): qty 1

## 2022-11-06 MED ORDER — ACETAMINOPHEN 325 MG PO TABS
650.0000 mg | ORAL_TABLET | Freq: Four times a day (QID) | ORAL | Status: DC | PRN
  Administered 2022-11-08: 650 mg via ORAL
  Filled 2022-11-06 (×2): qty 2

## 2022-11-06 NOTE — Patient Instructions (Signed)
Visit Information  Thank you for taking time to visit with me today. Please don't hesitate to contact me if I can be of assistance to you.   Following are the goals we discussed today:   Goals Addressed             This Visit's Progress    Assist with [place].   On track    Care Coordination Interventions:  Interventions Today    Flowsheet Row Most Recent Value  Chronic Disease   Chronic disease during today's visit Other  [Generalized Anxiety Disorder, Caregiver Stress/Burnout]  General Interventions   General Interventions Discussed/Reviewed General Interventions Discussed, Labs, Vaccines, Doctor Visits, Health Screening, General Interventions Reviewed, Annual Eye Exam, Durable Medical Equipment (DME), Community Resources, Level of Care, Communication with  [Communication with Primary Care Provider]  Vaccines COVID-19, Flu, Pneumonia, RSV, Shingles, Tetanus/Pertussis/Diphtheria  [Encouraged]  Doctor Visits Discussed/Reviewed Doctor Visits Discussed, Specialist, Doctor Visits Reviewed, Annual Wellness Visits, PCP  [Encouraged]  Health Screening Bone Density, Colonoscopy, Mammogram  [Encouraged]  Durable Medical Equipment (DME) Shower bench, BP Cuff, Walker  PCP/Specialist Visits Compliance with follow-up visit  [Encouraged]  Communication with PCP/Specialists, RN  Level of Care Adult Daycare, Personal Care Services, Applications, Assisted Living, [place]  [Encouraged]  Applications Medicaid, Personal Care Services, [place]-2  Exercise Interventions   Exercise Discussed/Reviewed Exercise Discussed, Assistive device use and maintanence, Exercise Reviewed, Physical Activity, Weight Managment  [Encouraged]  Physical Activity Discussed/Reviewed Physical Activity Discussed, Home Exercise Program (HEP), Physical Activity Reviewed, Types of exercise  [Encouraged]  Weight Management Weight maintenance  [Encouraged]  Education Interventions   Education  Provided Provided Engineer, site, Provided Web-based Education, Provided Education  Provided Verbal Education On Nutrition, Mental Health/Coping with Illness, When to see the doctor, [employer], [employer], Medication, Exercise, Applications, Foot Care, Eye Care, Labs  [Encouraged]  Applications Medicaid, Personal Care Services, [place] Discussed, Anxiety, Depression, Grief and Loss, Substance Abuse, Suicide, Other, Mental Health Reviewed, Coping Strategies, Crisis  [Domestic Violence]  Nutrition Interventions   Nutrition Discussed/Reviewed Nutrition Discussed, Adding fruits and vegetables, Increaing proteins, Decreasing fats, Decreasing salt, Supplmental nutrition, Decreasing sugar intake, Carbohydrate meal planning, Nutrition Reviewed, Fluid intake, Portion sizes  [Encouraged]  Pharmacy Interventions   Pharmacy Dicussed/Reviewed Pharmacy Topics Discussed, Pharmacy Topics Reviewed, Medications and their functions, Medication Adherence, Affording Medications  [Encouraged]  Safety Interventions   Safety Discussed/Reviewed Safety Discussed, Safety Reviewed, Fall Risk, Home Safety  [Encouraged]  Home Safety Assistive Devices, Need for home safety assessment, Refer for community resources  Advanced Directive Interventions   Advanced Directives Discussed/Reviewed Advanced Directives Discussed, Advanced Directives Reviewed     Active Listening & Reflection Utilized.  Verbalization of Feelings Encouraged.  Emotional Support Provided. Feelings of Caregiver Burnout Validated. Caregiver Stress Acknowledged. Caregiver Resources Reviewed. Caregiver Support Groups Mailed. Self-Enrollment in Caregiver Support Group of Interest Emphasized. Crisis Support Information, Agencies, [place]. Problem Solving Interventions Indicated. Task-Centered Solutions Implemented.   Solution-Focused Strategies  Activated. CSW Collaboration with Granddaughter, [name] to [facility] Admission on 11/05/2022, to [facility], with Chief Complaint of Right-Sided Facial Droop, Weakness, Inability to Follow Commands & Frequent Falls (X 4). CSW Collaboration with Granddaughter, [name] to Confirm Patient & Family's Interest in Patient Receiving Placement into a [place] for [employer], Upon Medical Stabilization & Discharge from [place] with Granddaughter, [name]  to [place] Placement for Patient, Upon Completion of Short-Term Rehabilitative Services at [place]. CSW Collaboration with Granddaughter, [name] to Request Review of The Following List of Express Scripts, Bank [employer] Company, [employer], Mailed on 11/05/2022: ~ Adult Day Care Programs  ~ [place] ~ [place] ~ [place] ~ [place] Application  ~ [place] Providers ~ 2023 Medicaid Tips ~ [employer] [place] with Granddaughter, [name] to Environmental health practitioner with Express Scripts, [place], in An Effort to [place] for Patient in [place], If She Decides Patient Will Return Home to Live Alone Upon Discharge from [place]. CSW Collaboration with Granddaughter, [name] to [employer] with CSW [phone]# [phone]), if She Has Questions, Needs Assistance, or If Additional Social Work Needs Are Identified Between Now & Our Next Scheduled Telephone [employer].      Our next appointment is by telephone on 11/19/2022 at 9:00 am.   Please call the care guide team at [phone] if you need to cancel or reschedule your appointment.   If you are  experiencing a Mental Health or [place] or need someone to talk to, please call the Suicide and Crisis Lifeline: 988 call the [place] National Suicide Prevention Lifeline: (704) 372-7232 or TTY: 386-688-2078 TTY 510-512-2522) to talk to a trained counselor call 1-800-273-TALK (toll free, 24 hour hotline) go to [facility] [address], [place] [phone]) call the [place]: [phone] call 911  Patient verbalizes understanding of instructions and care plan provided today and agrees to view in [place]. Active MyChart status and patient understanding of how to access instructions and care plan via MyChart confirmed with patient.     Telephone follow up appointment with care management team member scheduled for:  11/19/2022 at 9:00 am.   Nat Christen, BSW, MSW, [place]  Licensed Clinical Social Worker  [place]  Mailing [place]. [address], [place], [place] [address] Physical Address-[address], [place], [place] [address] Toll Free Main # [phone] Fax # [phone] Cell # [phone] [email or web address]

## 2022-11-06 NOTE — Evaluation (Signed)
Occupational Therapy Evaluation Patient Details Name: [name] MRN: [id number] DOB: 04-29-1936 Today's Date: 11/06/2022   History of Present Illness [name] is an 87 y.o. female with medical history significant of chronic respiratory failure on supplemental oxygen at 2 LPM at night, COPD, atrial fibrillation, CHF, suspected pancreatic cancer, tachycardia-bradycardia syndrome s/p pacemaker, CAD who presented to the emergency department due to strokelike symptoms.  Patient was unable to provide history, history was obtained from EDP and ED medical record.  Per report, granddaughter reported that patient last well known time was 8:30PM, she was noted to be confused when seen today around 10 AM by granddaughter.  Patient was reported to have had multiple falls at home and was noted with right-sided weakness.  She sustained another fall while trying to pick up a call from her Dr. this morning.  EMS was initially activated, but patient refused to go to the hospital, EMS was reactivated later in the day by granddaughter due to concern for patient's right-sided weakness and right-sided facial droop with difficulty in being able to follow commands.  Patient was recently admitted from 3/25 to 10/30/2022 due to community acquired pneumonia, primary delusional and hypertensive urgency.   Clinical Impression   Pt agreeable to OT evaluation. Pt reports independence for ADL's and mobility at baseline. Pt demosntrated some expressive difficulty at times and poor safety awareness. Pt is unsteady in standing and generally weak. Pt is a high fall rist in her current state. Pt left in chair with chair alarm set, tray in front, and floor mat in place. Nursing present in room at end of session. Pt will benefit from continued OT in the hospital and recommended venue below to increase strength, balance, and endurance for safe ADL's.         Recommendations for follow up therapy are one component of a multi-disciplinary  discharge planning process, led by the attending physician.  Recommendations may be updated based on patient status, additional functional criteria and insurance authorization.   Assistance Recommended at Discharge Intermittent Supervision/Assistance  Patient can return home with the following A little help with walking and/or transfers;A little help with bathing/dressing/bathroom;Assist for transportation;Help with stairs or ramp for entrance;Assistance with cooking/housework    Functional Status Assessment  Patient has had a recent decline in their functional status and demonstrates the ability to make significant improvements in function in a reasonable and predictable amount of time.  Equipment Recommendations  None recommended by OT           Precautions / Restrictions Precautions Precautions: Fall Restrictions Weight Bearing Restrictions: No      Mobility Bed Mobility Overal bed mobility: Needs Assistance Bed Mobility: Supine to Sit     Supine to sit: Supervision, Min guard     General bed mobility comments: Decreased safety awareness.    Transfers Overall transfer level: Needs assistance Equipment used: None Transfers: Sit to/from Stand, Bed to chair/wheelchair/BSC Sit to Stand: Min guard     Step pivot transfers: Min guard     General transfer comment: Unsteady in standing. Verbal cuing needed.      Balance Overall balance assessment: Needs assistance Sitting-balance support: No upper extremity supported Sitting balance-Leahy Scale: Good Sitting balance - Comments: seated at EOB   Standing balance support: No upper extremity supported, During functional activity Standing balance-Leahy Scale: Poor Standing balance comment: poor to fair RW  ADL either performed or assessed with clinical judgement   ADL Overall ADL's : Needs assistance/impaired     Grooming: Set up;Sitting   Upper Body Bathing: Set up;Sitting    Lower Body Bathing: Set up;Sitting/lateral leans   Upper Body Dressing : Set up;Sitting   Lower Body Dressing: Set up;Sitting/lateral leans   Toilet Transfer: Min guard;Ambulation Toilet Transfer Details (indicate cue type and reason): Pt ambulated to and from toilet with min G assist. Verbal cuing needed. Toileting- Clothing Manipulation and Hygiene: Set up;Sitting/lateral lean       Functional mobility during ADLs: Min guard       Vision Baseline Vision/History: 1 Wears glasses Ability to See in Adequate Light: 1 Impaired Patient Visual Report: No change from baseline Vision Assessment?: No apparent visual deficits Additional Comments: Outside baseline deficits. Difficult to formally assess due to pt's difficulty following directions at times.                Pertinent Vitals/Pain Pain Assessment Pain Assessment: No/denies pain     Hand Dominance Right   Extremity/Trunk Assessment Upper Extremity Assessment Upper Extremity Assessment: Generalized weakness   Lower Extremity Assessment Lower Extremity Assessment: Defer to PT evaluation   Cervical / Trunk Assessment Cervical / Trunk Assessment: Kyphotic   Communication Communication Communication: HOH;Expressive difficulties   Cognition Arousal/Alertness: Awake/alert Behavior During Therapy: WFL for tasks assessed/performed Overall Cognitive Status: No family/caregiver present to determine baseline cognitive functioning                                 General Comments: Hard of hearing                      Home Living Family/patient expects to be discharged to:: Private residence Living Arrangements: Alone Available Help at Discharge: Family;Friend(s) Type of Home: Mobile home Home Access: Level entry     Home Layout: One level     Bathroom Shower/Tub: Occupational psychologist: Standard Bathroom Accessibility: Yes   Home Equipment: Cane - single point           Prior Functioning/Environment Prior Level of Function : History of Falls (last six months);Needs assist             Mobility Comments: uses cane and RW PRN; gets Meals on wheels and needs assist with transportation to appointments. ADLs Comments: Pt reports indepndence with ADL's most of the time. Help given PRN per pt. Assist for IADL's.        OT Problem List: Decreased strength;Decreased range of motion;Impaired balance (sitting and/or standing);Decreased cognition      OT Treatment/Interventions: Self-care/ADL training;Therapeutic exercise;Therapeutic activities;Balance training;Patient/family education;DME and/or AE instruction;Neuromuscular education    OT Goals(Current goals can be found in the care plan section) Acute Rehab OT Goals Patient Stated Goal: return home OT Goal Formulation: With patient Time For Goal Achievement: 11/20/22 Potential to Achieve Goals: Good  OT Frequency: Min 1X/week                  AM-PAC OT "6 Clicks" Daily Activity     Outcome Measure Help from another person eating meals?: None Help from another person taking care of personal grooming?: A Little Help from another person toileting, which includes using toliet, bedpan, or urinal?: A Little Help from another person bathing (including washing, rinsing, drying)?: A Little Help from another person to put on and taking off regular  upper body clothing?: A Little Help from another person to put on and taking off regular lower body clothing?: A Little 6 Click Score: 19   End of Session    Activity Tolerance: Patient tolerated treatment well Patient left: in chair;with call bell/phone within reach;with chair alarm set;with nursing/sitter in room  OT Visit Diagnosis: Unsteadiness on feet (R26.81);Other abnormalities of gait and mobility (R26.89);Muscle weakness (generalized) (M62.81);Cognitive communication deficit (R41.841) Symptoms and signs involving cognitive functions:  (Possible CVA)                 Time: [id number] OT Time Calculation (min): 26 min Charges:  OT General Charges $OT Visit: 1 Visit OT Evaluation $OT Eval Low Complexity: 1 Low  Ingvald Theisen OT, MOT  Larey Seat 11/06/2022, 10:10 AM

## 2022-11-06 NOTE — Progress Notes (Signed)
Nurse at bedside with patient.

## 2022-11-06 NOTE — Progress Notes (Signed)
Responded to nursing call:  syncope -pt walking out of bathroom to wash hands at sink.  While passing flatus, pt had momentary episode LOC.  Lasted few seconds.  Mental status back to usual immediately after and conversant.   Subjective: Pt denies cp, sob, dizziness.  Does not recall what happened  Vitals:   11/06/22 0700 11/06/22 0734 11/06/22 0931 11/06/22 1133  BP: 120/68 (!) 145/62 (!) 165/63 108/81  Pulse: 66 83 87 77  Resp: 18 19 19 20   Temp: 98.4 F (36.9 C) 98 F (36.7 C) 97.6 F (36.4 C) 97.6 F (36.4 C)  TempSrc:  Oral Oral Oral  SpO2: 96% 95% 95% 100%  Weight:      Height:       CV--RRR Lung--CTA Abd--soft+BS/NT   Assessment/Plan: Syncope -suspect vagal -check orthostatics -cardiology eval     Orson Eva, DO [facility]

## 2022-11-06 NOTE — Progress Notes (Signed)
Patient is uncooperative. She is insisting that she does not need an EEG. She said that, "she knows her body, and she knows her rights, and there is nothing wrong with her brain, and she is going home".  Nurse stated that she has been uncooperative all day and has prevented other procedures.

## 2022-11-06 NOTE — Consult Note (Signed)
CARDIOLOGY CONSULT NOTE    Patient ID: [name]; [id number]; 05-15-1936   Admit date: 11/05/2022 Date of Consult: 11/06/2022  Primary Care Provider: Celene Squibb, MD Primary Cardiologist:  Primary Electrophysiologist:     Patient Profile:   [name] is a 87 y.o. female with a hx of multivessel CAD s/p complex RCA PCI and angioplasty of RCA ISR in 05/2021 with residual critical LCx and LAD disease on medical management with normal LVEF, paroxysmal atrial fibrillation refused anticoagulation, sinus node dysfunction s/p Medtronic PPM with recent generator change in 2023 who is being seen today for the evaluation of syncope at the request of Dr Tat.  History of Present Illness:   [name]  is a 87 year old F known to have multivessel CAD s/p complex RCA PCI and angioplasty of RCA ISR in 05/2021 with residual critical LCx and LAD disease on medical management with normal LVEF, paroxysmal atrial fibrillation refused anticoagulation, sinus node dysfunction s/p Medtronic PPM with recent generator change in 2023 is currently admitted to hospitalist team for the management of acute encephalopathy, unclear cause and delusions. Cardiologist consulted for the management of syncope.  Patient is not a reliable historian. She was previously admitted from 3/25 to 10/30/2022 due to community-acquired pneumonia, delusions, hypertensive urgency and was treated with IV antibiotics. She was discharged but returned back with multiple falls with right-sided weakness. CT imaging of the brain did not show any evidence of acute stroke. Neurology on board did not think it was stroke. Cardiologist consulted for the management of syncope. Patient is alert oriented x 3 but agitated and wants to go home. She also has delusions that her family wants to put her in nursing home against her wishes. Orthostatic vitals not obtained due to patient noncooperation. EKG and telemetry consistent with atrial paced ventricular  sensed rhythm and intermittent atrial fibrillation paroxysms on the telemetry.  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   CATARACT EXTRACTION     CHOLECYSTECTOMY     COLONOSCOPY  2011   COLONOSCOPY N/A 10/19/2014   Procedure: COLONOSCOPY;  Surgeon: Rogene Houston, MD;  Location: [facility];  Service: Endoscopy;  Laterality: N/A;  1030   CORONARY STENT INTERVENTION N/A 05/28/2021   Procedure: CORONARY STENT INTERVENTION;  Surgeon: Leonie Man, MD;  Location: [place] [facility];  Service: Cardiovascular;  Laterality: N/A;   CORONARY ULTRASOUND/IVUS N/A 05/28/2021   Procedure: Intravascular Ultrasound/IVUS;  Surgeon: Leonie Man, MD;  Location: [place] [facility];  Service: Cardiovascular;  Laterality: N/A;   ESOPHAGEAL DILATION N/A 05/02/2020   Procedure: ESOPHAGEAL DILATION;  Surgeon: Rogene Houston, MD;  [facility];  Service: Gastroenterology;  Laterality: N/A;   ESOPHAGOGASTRODUODENOSCOPY N/A 10/26/2014   Procedure: ESOPHAGOGASTRODUODENOSCOPY (EGD);  Surgeon: Rogene Houston, MD;  [facility];   Service: Endoscopy;  Laterality: N/A;  53 - Dr. has lunch and learn   ESOPHAGOGASTRODUODENOSCOPY N/A 02/24/2022   Procedure: ESOPHAGOGASTRODUODENOSCOPY (EGD);  Surgeon: Irving Copas., MD;  Location: [facility];  Service: Gastroenterology;  Laterality: N/A;   ESOPHAGOGASTRODUODENOSCOPY (EGD) WITH PROPOFOL N/A 05/02/2020   Procedure: ESOPHAGOGASTRODUODENOSCOPY (EGD) WITH PROPOFOL;  Surgeon: Rogene Houston, MD;  [facility];  Service: Gastroenterology;  Laterality: N/A;  250   Esophagogastroduodenoscopy with esophageal dilation  2004, 2006, 2007   EUS N/A 02/24/2022   Procedure: UPPER ENDOSCOPIC ULTRASOUND (EUS) LINEAR;  Surgeon: Irving Copas., MD;  Location: [facility];  Service: Gastroenterology;  Laterality: N/A;   FINE NEEDLE ASPIRATION  02/24/2022   Procedure: FINE NEEDLE ASPIRATION;  Surgeon: Rush Landmark Telford Nab., MD;  Location: [facility];  Service: Gastroenterology;;  red path sent   GIVENS CAPSULE STUDY N/A 10/31/2014   Procedure: GIVENS CAPSULE STUDY;  Surgeon: Rogene Houston, MD;  [facility];  Service: Endoscopy;  Laterality: N/A;  730 -- pacemaker--needs monitoring--outpatient bed   INSERT / REPLACE / REMOVE PACEMAKER  2010   LEFT HEART CATH AND CORONARY ANGIOGRAPHY N/A 03/21/2021   Procedure: LEFT HEART CATH AND CORONARY ANGIOGRAPHY;  Surgeon: Belva Crome, MD;  Location: [place] [facility];  Service: Cardiovascular;  Laterality: N/A;   LEFT HEART CATHETERIZATION WITH CORONARY ANGIOGRAM N/A 11/17/2011   Procedure: LEFT HEART CATHETERIZATION WITH CORONARY ANGIOGRAM;  Surgeon: Sherren Mocha, MD;  Location: [facility];  Service: Cardiovascular;  Laterality: N/A;   LEFT HEART CATHETERIZATION WITH CORONARY ANGIOGRAM N/A 09/26/2014   Procedure: LEFT HEART CATHETERIZATION WITH CORONARY ANGIOGRAM;  Surgeon: Leonie Man, MD;  Location: [facility];  Service: Cardiovascular;  Laterality: N/A;   PPM GENERATOR CHANGEOUT N/A 07/16/2022    Procedure: PPM GENERATOR CHANGEOUT;  Surgeon: Evans Lance, MD;  Location: [place] [facility];  Service: Cardiovascular;  Laterality: N/A;   Right rotator cuff repair     SAVORY DILATION N/A 02/24/2022   Procedure: SAVORY DILATION;  Surgeon: Rush Landmark Telford Nab., MD;  Location: [facility];  Service: Gastroenterology;  Laterality: N/A;   SHOULDER ACROMIOPLASTY Right 05/30/2015   Procedure: RIGHT SHOULDER ACROMIOPLASTY;  Surgeon: Carole Civil, MD;  Location: [facility];  Service: Orthopedics;  Laterality: Right;   SHOULDER OPEN ROTATOR CUFF REPAIR Right 05/30/2015   Procedure: OPEN ROTATOR CUFF REPAIR RIGHT SHOULDER;  Surgeon: Carole Civil, MD;  Location: [facility];  Service: Orthopedics;  Laterality: Right;   SHOULDER OPEN ROTATOR CUFF REPAIR Left 10/22/2016   Procedure: ROTATOR CUFF REPAIR SHOULDER OPEN;  Surgeon: Carole Civil, MD;  Location: [facility];  Service: Orthopedics;  Laterality: Left;   Subxiphoid pericardial window  11/10   VAGINAL HYSTERECTOMY         Inpatient Medications: Scheduled Meds:  amiodarone  200 mg Oral Once per day on Mon Tue Wed Thu Fri Sat   aspirin EC  81 mg Oral Q breakfast   enoxaparin (LOVENOX) injection  40 mg Subcutaneous Q24H   isosorbide mononitrate  30 mg Oral Daily   metoprolol succinate  50 mg Oral BID   ranolazine  500 mg Oral BID   ticagrelor  60 mg Oral BID   Continuous Infusions:  PRN Meds: acetaminophen **OR** acetaminophen, albuterol, ALPRAZolam, ondansetron **OR** ondansetron (ZOFRAN) IV  Allergies:    Allergies  Allergen Reactions   Amitriptyline Hcl Other (See Comments)    Caused "jaws to twist and lock"   Plavix [Clopidogrel Bisulfate] Hives   Sulfonamide Derivatives Other (See Comments)    UNKNOWN REACTION    Social History:   Social History   Socioeconomic History   Marital status: Widowed    Spouse name: Not on file   Number of children: 1   Years of education: 8   Highest education level: 8th grade   Occupational History   Occupation: Retired    Fish farm manager: RETIRED  Tobacco Use   Smoking status: Former    Packs/day: 2.00    Years: 10.00    Additional pack years: 0.00    Total pack years: 20.00    Types: Cigarettes    Quit date: 05/23/1989    Years since quitting: 33.4    Passive exposure: Past   Smokeless tobacco: Never   Tobacco comments:    Verified by Granddaughter, [name]  Vaping Use   Vaping Use: Never used  Substance and Sexual Activity   Alcohol use: No    Alcohol/week: 0.0 standard drinks of alcohol   Drug use: No   Sexual activity: Not Currently    Partners: Male  Other Topics Concern   Not on file  Social History Narrative   She lives alone, has adopted daughter.   Social Determinants of Health   Financial Resource Strain: Low Risk  (11/05/2022)   Overall Financial Resource Strain (CARDIA)    Difficulty of Paying Living Expenses: Not hard at all  Food Insecurity: No Food Insecurity (11/06/2022)   Hunger Vital Sign    Worried About Running Out of Food in the Last Year: Never true    Ran Out of Food in the Last Year: Never true  Transportation Needs: No Transportation Needs (11/06/2022)   PRAPARE - Hydrologist (Medical): No    Lack of Transportation (Non-Medical): No  Physical Activity: Inactive (11/05/2022)   Exercise Vital Sign    Days of Exercise per Week: 0 days    Minutes of Exercise per Session: 0 min  Stress: No Stress Concern Present (11/05/2022)   [place]    Feeling of Stress : Not at all  Social Connections: Moderately Integrated (11/05/2022)   Social Connection and Isolation Panel [NHANES]    Frequency of Communication with Friends and Family: More than three times a week    Frequency of Social Gatherings with Friends and Family: More than three times a week    Attends Religious Services: More than 4 times per year    Active Member of [employer] or  Organizations: Yes    Attends Archivist Meetings: Never    Marital Status: Widowed  Intimate Partner Violence: Not At Risk (11/06/2022)   Humiliation, Afraid, Rape, and Kick questionnaire    Fear of Current or Ex-Partner: No    Emotionally Abused: No    Physically Abused: No    Sexually Abused: No    Family History:    Family History  Problem Relation Age of Onset   Cancer Mother        Colon    Coronary artery disease Sister    Coronary artery disease Brother  Arthritis Other    Lung disease Other    Asthma Other      ROS:  Please see the history of present illness.  ROS  All other ROS reviewed and negative.     Physical Exam/Data:   Vitals:   11/06/22 0734 11/06/22 0931 11/06/22 1133 11/06/22 1447  BP: (!) 145/62 (!) 165/63 108/81 97/71  Pulse: 83 87 77 82  Resp: 19 19 20 18   Temp: 98 F (36.7 C) 97.6 F (36.4 C) 97.6 F (36.4 C) 97.9 F (36.6 C)  TempSrc: Oral Oral Oral Oral  SpO2: 95% 95% 100% 99%  Weight:      Height:        Intake/Output Summary (Last 24 hours) at 11/06/2022 1558 Last data filed at 11/06/2022 1254 Gross per 24 hour  Intake 240 ml  Output 600 ml  Net -360 ml   Filed Weights   11/05/22 1845 11/05/22 2329  Weight: 62.1 kg 64.6 kg   Body mass index is 23.7 kg/m.  Patient refused physical examination  Relevant CV Studies:   Laboratory Data:  Chemistry Recent Labs  Lab 11/01/22 0446 11/05/22 1817 11/05/22 1833 11/06/22 0407  NA 136 140 137 139  K 3.4* 3.4* 3.2* 3.0*  CL 100 105 103 106  CO2 23  --  23 25  GLUCOSE 98 98 98 86  BUN 22 14 15 11   CREATININE 1.09* 0.70 0.84 0.66  CALCIUM 9.7  --  9.5 8.9  GFRNONAA 50*  --  >60 >60  ANIONGAP 13  --  11 8    Recent Labs  Lab 11/01/22 0446 11/05/22 1833 11/06/22 0407  PROT 7.9 7.6 6.3*  ALBUMIN 4.2 3.9 3.2*  AST 36 33 26  ALT 26 23 20   ALKPHOS 87 80 68  BILITOT 0.6 1.1 1.3*   Hematology Recent Labs  Lab 11/01/22 0446 11/05/22 1817 11/05/22 1908  11/06/22 0407  WBC 7.9  --  8.6 6.7  RBC 4.50  --  4.34 3.87  HGB 13.7 13.6 13.1 11.9*  HCT 41.5 40.0 40.3 36.4  MCV 92.2  --  92.9 94.1  MCH 30.4  --  30.2 30.7  MCHC 33.0  --  32.5 32.7  RDW 14.4  --  14.6 14.7  PLT 208  --  208 191   Cardiac EnzymesNo results for input(s): "TROPONINI" in the last 168 hours. No results for input(s): "TROPIPOC" in the last 168 hours.  BNP Recent Labs  Lab 11/06/22 0407  BNP 354.0*    DDimer No results for input(s): "DDIMER" in the last 168 hours.  Radiology/Studies:  CT HEAD WO CONTRAST (5MM)  Result Date: 11/06/2022 CLINICAL DATA:  Neuro deficit, acute, stroke suspected. Altered mental status. EXAM: CT HEAD WITHOUT CONTRAST TECHNIQUE: Contiguous axial images were obtained from the base of the skull through the vertex without intravenous contrast. RADIATION DOSE REDUCTION: This exam was performed according to the departmental dose-optimization program which includes automated exposure control, adjustment of the mA and/or kV according to patient size and/or use of iterative reconstruction technique. COMPARISON:  Head CT 11/05/2022. FINDINGS: Brain: No acute intracranial hemorrhage. Unchanged mild chronic small-vessel disease with old lacunar infarct in the left lentiform nucleus. Gray-white differentiation is otherwise preserved. No hydrocephalus or extra-axial collection. No mass effect or midline shift. Vascular: No hyperdense vessel or unexpected calcification. Skull: No calvarial fracture or suspicious bone lesion. Skull base is unremarkable. Sinuses/Orbits: Unremarkable. Other: None. IMPRESSION: 1. No acute intracranial process. 2. Unchanged mild chronic small-vessel disease.  Electronically Signed   By: Emmit Alexanders M.D.   On: 11/06/2022 08:27   DG Chest Port 1 View  Result Date: 11/05/2022 CLINICAL DATA:  Weakness, coronary artery disease, COPD EXAM: PORTABLE CHEST 1 VIEW COMPARISON:  11/01/2022 FINDINGS: The lungs are symmetrically well expanded.  No pneumothorax or pleural effusion. Mild progressive central pulmonary vascular congestion and trace diffuse interstitial pulmonary edema, likely cardiogenic in nature, are new since prior examination. Cardiac size is within normal limits. Tortuosity and ectasia of the thoracic aorta is noted, better assessed on CT examination of 11/01/2022. Left subclavian dual lead pacemaker is unchanged. IMPRESSION: 1. Developing, mild cardiogenic failure. 2. Tortuosity and ectasia of the thoracic aorta. Electronically Signed   By: Fidela Salisbury M.D.   On: 11/05/2022 20:04   CT ANGIO HEAD NECK W WO CM W PERF (CODE STROKE)  Result Date: 11/05/2022 CLINICAL DATA:  Neuro deficit with acute stroke suspected. Right-sided weakness and facial droop EXAM: CT ANGIOGRAPHY HEAD AND NECK CT PERFUSION BRAIN TECHNIQUE: Multidetector CT imaging of the head and neck was performed using the standard protocol during bolus administration of intravenous contrast. Multiplanar CT image reconstructions and MIPs were obtained to evaluate the vascular anatomy. Carotid stenosis measurements (when applicable) are obtained utilizing NASCET criteria, using the distal internal carotid diameter as the denominator. Multiphase CT imaging of the brain was performed following IV bolus contrast injection. Subsequent parametric perfusion maps were calculated using RAPID software. RADIATION DOSE REDUCTION: This exam was performed according to the departmental dose-optimization program which includes automated exposure control, adjustment of the mA and/or kV according to patient size and/or use of iterative reconstruction technique. CONTRAST:  121mL OMNIPAQUE IOHEXOL 350 MG/ML SOLN COMPARISON:  Head CT from earlier today. FINDINGS: Aortic arch: Atheromatous plaque.  Three vessel branching Right carotid system: Atheromatous plaque at the bifurcation. High-grade stenosis is suspected although there is limited quantification due to more shin artifact. Narrowing is  over 50%. Left carotid system: Atheromatous plaque at the common carotid origin and at the bifurcation without flow reducing stenosis or ulceration. Vertebral arteries: Calcified plaque at the proximal right subclavian causes 70% stenosis as measured on coronal reformats. Advanced narrowing at the right vertebral origin due to atheromatous plaque. Moderate narrowing at the left vertebral origin. No dissection or beading. Skeleton: Ordinary cervical spine degeneration Other neck: No acute finding Upper chest: Emphysema. Review of the MIP images confirms the above findings CTA HEAD FINDINGS Anterior circulation: Limited by motion. Extensive atheromatous calcification of the cavernous carotids. There may be significant narrowing at the right MCA bifurcation, although degraded by motion. No emergent large vessel occlusion Posterior circulation: Right dominant vertebral artery. Before atheromatous plaque with high-grade narrowing affecting the right more than left vertebral artery. Moderately extensive atheromatous irregularity of the basilar. Severe left P1 segment stenosis with downstream underfilling. Bilateral PCA atheromatous irregularity is extensive. Venous sinuses: Unremarkable. CT Brain Perfusion Findings: ASPECTS: 10 CBF (<30%) Volume: 23mL Perfusion (Tmax>6.0s) volume: 53mL There is notable motion artifact. IMPRESSION: 1. Limited study due to motion artifact. 2. Severe narrowing at the left P1 segment. On coronal thick MIPS there is question of luminal filling defect but given the degree of motion this may instead be atheromatous. 3. Right ICA origin stenosis that is likely flow reducing with possible downstream underfilling. 4. 70% narrowing at the right subclavian origin. 5. High-grade narrowing of the right vertebral artery at its origin and vertebrobasilar junction. Advanced narrowing of the non dominant left V4 segment as well. 6. Moderate distal basilar stenosis.  7. Severe PCA atheromatous irregularity. 8.  Noncontributory CT perfusion. Electronically Signed   By: Jorje Guild M.D.   On: 11/05/2022 19:26   CT Cervical Spine Wo Contrast  Result Date: 11/05/2022 CLINICAL DATA:  Trauma, multiple falls EXAM: CT CERVICAL SPINE WITHOUT CONTRAST TECHNIQUE: Multidetector CT imaging of the cervical spine was performed without intravenous contrast. Multiplanar CT image reconstructions were also generated. RADIATION DOSE REDUCTION: This exam was performed according to the departmental dose-optimization program which includes automated exposure control, adjustment of the mA and/or kV according to patient size and/or use of iterative reconstruction technique. COMPARISON:  11/01/2022 FINDINGS: There is patient motion in some of the images limiting the study. Alignment: Alignment of posterior margins of vertebral bodies is within normal limits. Skull base and vertebrae: No recent fracture is seen. Soft tissues and spinal canal: Posterior bony spurs causing mild extrinsic pressure on the ventral margin of thecal sac from C3 to C6 levels. Disc levels: There is minimal encroachment of right neural foramen at C2-C3 level. There is mild encroachment of neural foramina from C3-C7 levels. Upper chest: Linear patchy pleural densities are seen in both apices. Other: Thyroid is enlarged with inhomogeneous attenuation. IMPRESSION: Motion limited study. No recent fracture is seen in cervical spine. Cervical spondylosis with encroachment of neural foramina at multiple levels. There is mild extrinsic pressure over the ventral margin of thecal sac caused by posterior bony spurs without significant central spinal stenosis. Thyroid is enlarged with inhomogeneous attenuation. Nonemergent thyroid sonogram may be considered. Electronically Signed   By: Elmer Picker M.D.   On: 11/05/2022 19:17   CT HEAD CODE STROKE WO CONTRAST  Result Date: 11/05/2022 CLINICAL DATA:  Code stroke. Neuro deficit with acute stroke suspected EXAM: CT HEAD  WITHOUT CONTRAST TECHNIQUE: Contiguous axial images were obtained from the base of the skull through the vertex without intravenous contrast. RADIATION DOSE REDUCTION: This exam was performed according to the departmental dose-optimization program which includes automated exposure control, adjustment of the mA and/or kV according to patient size and/or use of iterative reconstruction technique. COMPARISON:  Head CT 11/01/2022 FINDINGS: Brain: No evidence of acute infarction, hemorrhage, hydrocephalus, extra-axial collection or mass lesion/mass effect. Chronic small vessel ischemia with chronic perforator infarct at the left corona radiata. Vascular: No hyperdense vessel or unexpected calcification. Skull: Normal. Negative for fracture or focal lesion. Sinuses/Orbits: No acute finding. Other: Prelim sent in epic chat. ASPECTS [facility] Stroke Program Early CT Score) Not scored without localizing history. IMPRESSION: No acute or interval finding. Electronically Signed   By: Jorje Guild M.D.   On: 11/05/2022 18:32    Assessment and Plan:   Patient is a 87 year old F known to have multivessel CAD s/p complex RCA PCI and angioplasty of RCA ISR in 05/2021 with residual critical LCx and LAD disease on medical management with normal LVEF, paroxysmal atrial fibrillation refused anticoagulation, sinus node dysfunction s/p Medtronic PPM with recent generator change in 2023 is currently admitted to hospitalist team for the management of acute encephalopathy, unclear cause and delusions. Cardiologist consulted for the management of syncope.  # Syncope unlikely cardiac -Patient had an episode of syncope this morning at 11-ish with loss of consciousness for a few seconds. Patient woke up after sternal rub and was back to baseline. She is not reliable historian. She is alert oriented x 3 but agitated. She kept repeating that she wants to go home. Telemetry reviewed and did not show any evidence of ventricular tachycardia  or pauses as etiology of syncope.  Atrial paced ventricular sensed rhythm and brief paroxysms of atrial fibrillation noted intermittently. It is unclear at this time if patient had unsteadiness from her feet and fell down. Obtain orthostatic vitals to rule out orthostatic hypotension.  # Delusions: Patient has been on amiodarone since 2010.  Although amiodarone is known to cause psychosis, hallucinations, delusions etc., in the short-term I do not think amiodarone is the cause for her delusions as she has been on it since 2010. # Multivessel CAD: She is complaining of chest pain after sternal rub was given this morning.  I do not think this is cardiac in nature.  Continue aspirin 81 mg once daily and Brilinta 60 mg twice daily. Continue metoprolol succinate 50 mg once daily, Imdur 30 mg once daily and ranolazine 500 mg once daily. # Paroxysmal atrial fibrillation s/p Medtronic PPM: Telemetry noted brief paroxysms of atrial fibrillation, rate controlled. Continue amiodarone 200 mg once daily, metoprolol succinate 50 mg once daily. She refused to be on systemic anticoagulation.  I have spent a total of 70 minutes with patient reviewing chart , telemetry, EKGs, labs and examining patient as well as establishing an assessment and plan that was discussed with the patient.  > 50% of time was spent in direct patient care.      For questions or updates, please contact [facility] Please consult [email or web address] for contact info under Cardiology/STEMI.   Signed, Vangie Bicker, MD 11/06/2022 3:58 PM

## 2022-11-06 NOTE — Plan of Care (Signed)
  Problem: Acute Rehab PT Goals(only PT should resolve) Goal: Pt Will Go Supine/Side To Sit Outcome: Progressing Flowsheets (Taken 11/06/2022 0820) Pt will go Supine/Side to Sit:  with modified independence  with supervision Goal: Pt Will Go Sit To Supine/Side Outcome: Progressing Flowsheets (Taken 11/06/2022 0820) Pt will go Sit to Supine/Side:  with modified independence  with supervision Goal: Patient Will Transfer Sit To/From Stand Outcome: Progressing Flowsheets (Taken 11/06/2022 0820) Patient will transfer sit to/from stand:  with supervision  with min guard assist Goal: Pt Will Transfer Bed To Chair/Chair To Bed Outcome: Progressing Flowsheets (Taken 11/06/2022 0820) Pt will Transfer Bed to Chair/Chair to Bed:  with supervision  min guard assist Goal: Pt Will Ambulate Outcome: Progressing Flowsheets (Taken 11/06/2022 0820) Pt will Ambulate:  100 feet  with least restrictive assistive device  with min guard assist  with supervision Goal: Pt/caregiver will Perform Home Exercise Program Outcome: Progressing Flowsheets (Taken 11/06/2022 0820) Pt/caregiver will Perform Home Exercise Program:  For increased strengthening  For improved balance  With Supervision, verbal cues required/provided  8:21 AM, 11/06/22 Mearl Latin PT, DPT Physical Therapist at [facility]

## 2022-11-06 NOTE — Evaluation (Signed)
Physical Therapy Evaluation Patient Details Name: [name] MRN: [id number] DOB: 07-29-1936 Today's Date: 11/06/2022  History of Present Illness  [name] is an 87 y.o. female with medical history significant of chronic respiratory failure on supplemental oxygen at 2 LPM at night, COPD, atrial fibrillation, CHF, suspected pancreatic cancer, tachycardia-bradycardia syndrome s/p pacemaker, CAD who presented to the emergency department due to strokelike symptoms.  Patient was unable to provide history, history was obtained from EDP and ED medical record.  Per report, granddaughter reported that patient last well known time was 8:30PM, she was noted to be confused when seen today around 10 AM by granddaughter.  Patient was reported to have had multiple falls at home and was noted with right-sided weakness.  She sustained another fall while trying to pick up a call from her Dr. this morning.  EMS was initially activated, but patient refused to go to the hospital, EMS was reactivated later in the day by granddaughter due to concern for patient's right-sided weakness and right-sided facial droop with difficulty in being able to follow commands.  Patient was recently admitted from 3/25 to 10/30/2022 due to community acquired pneumonia, primary delusional and hypertensive urgency.    Clinical Impression  Patient limited for functional mobility as stated below secondary to BLE weakness, fatigue and impaired standing balance. Patient with labored transition to seated EOB and demonstrates good sitting tolerance and sitting balance at EOB. She attempts to transfer to standing with HHA but is very unsteady and unable to transfer to full standing position. Patient able to transfer to standing with RW and assist with continued unsteadiness upon standing. Patient ambulates with slow, mildly unsteady cadence with RW without loss of balance. Patient returned to bed at end of session and then assisted patient to  wheelchair with hospital transport staff for testing. Patient will benefit from continued physical therapy in hospital and recommended venue below to increase strength, balance, endurance for safe ADLs and gait.        Recommendations for follow up therapy are one component of a multi-disciplinary discharge planning process, led by the attending physician.  Recommendations may be updated based on patient status, additional functional criteria and insurance authorization.  Follow Up Recommendations Can patient physically be transported by private vehicle: Yes     Assistance Recommended at Discharge Frequent or constant Supervision/Assistance  Patient can return home with the following  A little help with walking and/or transfers;A little help with bathing/dressing/bathroom;Help with stairs or ramp for entrance;Assistance with cooking/housework    Equipment Recommendations None recommended by PT  Recommendations for Other Services       Functional Status Assessment Patient has had a recent decline in their functional status and demonstrates the ability to make significant improvements in function in a reasonable and predictable amount of time.     Precautions / Restrictions Precautions Precautions: Fall Restrictions Weight Bearing Restrictions: No      Mobility  Bed Mobility Overal bed mobility: Needs Assistance Bed Mobility: Supine to Sit, Sit to Supine     Supine to sit: Min guard Sit to supine: Min guard   General bed mobility comments: takes extra time; decreased safety awareness    Transfers Overall transfer level: Needs assistance Equipment used: 1 person hand held assist, Rolling walker (2 wheels) Transfers: Sit to/from Stand Sit to Stand: Min assist           General transfer comment: hand held with min assist was very unsteady and unable to come to  fully standing, min assist with RW to transfer to standing    Ambulation/Gait Ambulation/Gait assistance:  Min assist Gait Distance (Feet): 35 Feet Assistive device: Rolling walker (2 wheels) Gait Pattern/deviations: Step-through pattern, Decreased stride length Gait velocity: decreased     General Gait Details: slow, labored, unsteady, cueing for proper RW use  Stairs            Wheelchair Mobility    Modified Rankin (Stroke Patients Only)       Balance Overall balance assessment: Needs assistance   Sitting balance-Leahy Scale: Fair     Standing balance support: Bilateral upper extremity supported, Reliant on assistive device for balance Standing balance-Leahy Scale: Poor Standing balance comment: fair/poor with RW                             Pertinent Vitals/Pain      Home Living Family/patient expects to be discharged to:: Private residence Living Arrangements: Alone Available Help at Discharge: Family;Friend(s) Type of Home: Mobile home Home Access: Level entry       Home Layout: One level Home Equipment: Cane - single point      Prior Function Prior Level of Function : History of Falls (last six months);Needs assist (meals on wheels; transportation)             Mobility Comments: uses cane sometimes; gets Meals on wheels and needs assist with transportation to appointments. ADLs Comments: assisted with ADL     Hand Dominance   Dominant Hand: Right    Extremity/Trunk Assessment   Upper Extremity Assessment Upper Extremity Assessment: Defer to OT evaluation    Lower Extremity Assessment Lower Extremity Assessment: Generalized weakness    Cervical / Trunk Assessment Cervical / Trunk Assessment: Kyphotic  Communication   Communication: No difficulties  Cognition Arousal/Alertness: Awake/alert Behavior During Therapy: WFL for tasks assessed/performed Overall Cognitive Status: Within Functional Limits for tasks assessed                                 General Comments: Hard of hearing        General Comments       Exercises     Assessment/Plan    PT Assessment Patient needs continued PT services  PT Problem List Decreased strength;Decreased activity tolerance;Decreased balance;Decreased mobility;Decreased coordination       PT Treatment Interventions Neuromuscular re-education;Gait training;Functional mobility training;Therapeutic activities;Therapeutic exercise;Balance training    PT Goals (Current goals can be found in the Care Plan section)  Acute Rehab PT Goals Patient Stated Goal: "go home" PT Goal Formulation: With patient Time For Goal Achievement: 11/20/22 Potential to Achieve Goals: Fair    Frequency Min 3X/week     Co-evaluation               AM-PAC PT "6 Clicks" Mobility  Outcome Measure Help needed turning from your back to your side while in a flat bed without using bedrails?: A Little Help needed moving from lying on your back to sitting on the side of a flat bed without using bedrails?: A Little Help needed moving to and from a bed to a chair (including a wheelchair)?: A Lot Help needed standing up from a chair using your arms (e.g., wheelchair or bedside chair)?: A Lot Help needed to walk in hospital room?: A Lot Help needed climbing 3-5 steps with a railing? : A Lot 6 Click  Score: 14    End of Session Equipment Utilized During Treatment: Gait belt Activity Tolerance: Patient tolerated treatment well;Patient limited by fatigue Patient left: Other (comment) (with hospital transport staff for test) Nurse Communication: Mobility status PT Visit Diagnosis: Unsteadiness on feet (R26.81);Other abnormalities of gait and mobility (R26.89);Muscle weakness (generalized) (M62.81)    Time: [id number] PT Time Calculation (min) (ACUTE ONLY): 10 min   Charges:   PT Evaluation $PT Eval Low Complexity: 1 Low          8:19 AM, 11/06/22 Mearl Latin PT, DPT Physical Therapist at [facility]

## 2022-11-06 NOTE — Progress Notes (Signed)
Present time nurse at bedside with patient, Labs ordered, patient is allowing them to collect blood work. Alert and oriented to person,place ,and time, confused to situation. Plan of care on going.

## 2022-11-06 NOTE — TOC Progression Note (Signed)
Transition of Care [facility]) - Progression Note    Patient Details  Name: [name] MRN: [id number] Date of Birth: [birth year]  Transition [facility]) CM/SW Contact  [name], LCSW Phone Number: 11/06/2022, 1:27 PM  Clinical Narrative:     Per MD, pt does not have capacity to make decisions for dc planning.   Pt has been re-hospitalized several times in the last two weeks for falls at home. Family has been attempting to assist pt though she is not agreeable to the needed care.   Pt's daughter and granddaughter, April, are now in agreement with SNF placement which has been recommended by PT. Pt does not have a POA-HC. Granddaughter agreeable to file for guardianship due to pt's lack of capacity.   Provided April with directions on how to initiate the process. Once filed, the court will make decision on interim guardianship within 72 business hours. If pt is found to lack capacity by the court, April will sign pt into a SNF. Pt will also need insurance auth.  Updated MD. Will refer out for bed offers and continue to follow.   Expected Discharge Plan: Skilled Nursing Facility Barriers to Discharge: Ship broker, Unsafe home situation, SNF Pending bed offer, Continued Medical Work up  Expected Discharge Plan and [place] Choice: [place] arrangements for the past 2 months: Single Family Home                                       Social Determinants of Health (SDOH) Interventions SDOH Screenings   Food Insecurity: No Food Insecurity (11/06/2022)  Housing: Low Risk  (11/06/2022)  Transportation Needs: No Transportation Needs (11/06/2022)  Utilities: At Risk (11/06/2022)  Alcohol Screen: Low Risk  (11/05/2022)  Depression (PHQ2-9): Low Risk  (11/05/2022)  Financial Resource Strain: Low Risk  (11/05/2022)  Physical Activity: Inactive (11/05/2022)  Social Connections: Moderately Integrated (11/05/2022)  Stress: No Stress Concern  Present (11/05/2022)  Tobacco Use: Medium Risk (11/05/2022)    Readmission Risk Interventions    07/02/2022   12:34 PM  Readmission Risk Prevention Plan  Transportation Screening Complete  PCP or Specialist Appt within 5-7 Days Complete  Home Care Screening Complete  Medication Review (RN CM) Complete

## 2022-11-06 NOTE — Plan of Care (Signed)
  Problem: Acute Rehab OT Goals (only OT should resolve) Goal: Pt. Will Perform Grooming Flowsheets (Taken 11/06/2022 1012) Pt Will Perform Grooming:  Independently  standing Goal: Pt. Will Perform Lower Body Dressing Flowsheets (Taken 11/06/2022 1012) Pt Will Perform Lower Body Dressing:  Independently  sit to/from stand  sitting/lateral leans Goal: Pt. Will Transfer To Toilet Flowsheets (Taken 11/06/2022 1012) Pt Will Transfer to Toilet:  Independently  ambulating Goal: Pt. Will Perform Toileting-Clothing Manipulation Flowsheets (Taken 11/06/2022 1012) Pt Will Perform Toileting - Clothing Manipulation and hygiene:  Independently  sitting/lateral leans Goal: Pt/Caregiver Will Perform Home Exercise Program Flowsheets (Taken 11/06/2022 1012) Pt/caregiver will Perform Home Exercise Program:  Increased strength  Both right and left upper extremity  Independently  Harnoor Reta OT, MOT

## 2022-11-06 NOTE — Progress Notes (Addendum)
Patient refused Labs this am,was reported to me by third shift nurse Bre' RN. Assessment of patient this am, very hard of hearing and also agitated that she is here in the hospital,states" I want to go home". Vital signs stable. Dr Tat notified. Plan of care on going.

## 2022-11-06 NOTE — Progress Notes (Signed)
Patient has rested well.     Stroke scale unremarkable with no new deficits, although patient is confused at times.  Patient used restroom with one assist, and spoke to granddaughter and was adament about returning home and said " Aint nobody gonna be putting me no where."   Vitals stable.

## 2022-11-06 NOTE — NC FL2 (Signed)
[place] LEVEL OF CARE FORM     IDENTIFICATION  Patient Name: [name] Birthdate: 1935/12/23 Sex: female Admission Date (Current Location): 11/05/2022  [facility] and [place] Number:  [employer] and Address:  [place] [address], [place]      Provider Number: [id number]  Attending Physician Name and Address:  Orson Eva, MD  Relative Name and Phone Number:       Current Level of Care: Hospital Recommended Level of Care: [place] Prior Approval Number:    Date Approved/Denied:   PASRR Number: [id number]  Discharge Plan: SNF    Current Diagnoses: Patient Active Problem List   Diagnosis Date Noted   Acute metabolic encephalopathy [id number]   Recurrent falls 11/06/2022   Right sided weakness 11/05/2022   Paranoid delusion 10/28/2022   Current moderate episode of major depressive disorder 10/28/2022   CAP (community acquired pneumonia) 10/27/2022   Hardening of the aorta (main artery of the heart) 10/27/2022   Pruritic rash 10/27/2022   Generalized abdominal pain 09/29/2022   Chronic obstructive lung disease 06/29/2022   Chronic respiratory failure with hypoxia [id number]   Chronic systolic heart failure [id number]   IPMN (intraductal papillary mucinous neoplasm) 06/09/2022   Constipation 06/09/2022   Atherosclerosis of coronary artery without angina pectoris 04/23/2022   Upper abdominal pain 12/03/2021   Hearing difficulty 06/19/2021   Neck pain 05/16/2021   Coronary arteriosclerosis 05/02/2021   Nausea 04/17/2021   Productive cough 04/17/2021   Chest pain 03/30/2021   COVID-19 virus infection 03/30/2021   Non-STEMI (non-ST elevated myocardial infarction)    Mass of pancreas 03/20/2021   Hypertensive urgency 03/19/2021   Elevated troponin 03/19/2021   Prolonged QT interval 03/19/2021   Edema of lower extremity 01/14/2021   Impaired fasting glucose 01/07/2021   Generalized anxiety  disorder 01/07/2021   Essential tremor 01/07/2021   Proteinuria 01/07/2021   Essential hypertension 01/02/2021   Iron deficiency anemia 01/02/2021   Prediabetes 01/02/2021   Difficulty swallowing 07/03/2020   Nonspecific chest pain 09/15/2016   Complete rotator cuff tear of left shoulder    Anemia 10/31/2014   H/O heart artery stent 10/30/2014   Chest pain with moderate risk for cardiac etiology [id number]   Diastolic dysfunction with chronic heart failure 08/23/2014   Chronic diastolic heart failure [id number]   Abdominal pain 08/23/2014   Acquired obstruction of both nasolacrimal ducts 06/15/2014   Lower abdominal pain 01/27/2012   Hypokalemia 06/03/2011   PERICARDIAL EFFUSION 07/10/2009   Disease of pericardium 07/10/2009   Essential hypertension, benign 04/18/2009   Triple vessel disease of the heart 04/18/2009   Benign essential hypertension 04/18/2009   PPM-Medtronic 04/04/2009   Tachycardia-bradycardia syndrome 01/26/2009   HLD (hyperlipidemia) 12/19/2008   Anxiety 12/19/2008   Atrial fibrillation 12/19/2008   GERD (gastroesophageal reflux disease) 12/19/2008   Abnormal involuntary movements 12/19/2008   Atrial fibrillation, chronic 12/19/2008   Mixed hyperlipidemia 12/19/2008    Orientation RESPIRATION BLADDER Height & Weight     Self, Place  Normal Incontinent Weight: 142 lb 6.7 oz (64.6 kg) Height:  5\' 5"  (165.1 cm)  BEHAVIORAL SYMPTOMS/MOOD NEUROLOGICAL BOWEL NUTRITION STATUS      Continent Diet (see dc summary)  AMBULATORY STATUS COMMUNICATION OF NEEDS Skin   Extensive Assist Verbally Normal                       Personal Care Assistance Level of Assistance  Bathing, Feeding, Dressing  Bathing Assistance: Limited assistance Feeding assistance: Independent Dressing Assistance: Limited assistance     Functional Limitations Info  Sight, Hearing, Speech Sight Info: Impaired Hearing Info: Impaired Speech Info: Adequate    SPECIAL CARE FACTORS  FREQUENCY  PT (By licensed PT), OT (By licensed OT)     PT Frequency: 5x week OT Frequency: 3x week            Contractures Contractures Info: Not present    Additional Factors Info  Code Status, Allergies Code Status Info: DNR Allergies Info: Amitriptyline Hcl, Plavix, Sulfonamide Derivatives           Current Medications (11/06/2022):  This is the current hospital active medication list Current Facility-Administered Medications  Medication Dose Route Frequency Provider Last Rate Last Admin   acetaminophen (TYLENOL) tablet 650 mg  650 mg Oral Q6H PRN Adefeso, Oladapo, DO       Or   acetaminophen (TYLENOL) suppository 650 mg  650 mg Rectal Q6H PRN Adefeso, Oladapo, DO       albuterol (PROVENTIL) (2.5 MG/3ML) 0.083% nebulizer solution 2.5 mg  2.5 mg Nebulization Q4H PRN Adefeso, Oladapo, DO       ALPRAZolam (XANAX) tablet 1 mg  1 mg Oral TID PRN Orson Eva, MD   1 mg at 11/06/22 1045   amiodarone (PACERONE) tablet 200 mg  200 mg Oral Once per day on Mon Tue Wed Thu Fri Sat Bernadette Hoit, DO   200 mg at 11/06/22 0932   aspirin EC tablet 81 mg  81 mg Oral Q breakfast Adefeso, Oladapo, DO   81 mg at 11/06/22 0932   enoxaparin (LOVENOX) injection 40 mg  40 mg Subcutaneous Q24H Adefeso, Oladapo, DO   40 mg at 11/06/22 0933   isosorbide mononitrate (IMDUR) 24 hr tablet 30 mg  30 mg Oral Daily Adefeso, Oladapo, DO   30 mg at 11/06/22 0932   metoprolol succinate (TOPROL-XL) 24 hr tablet 50 mg  50 mg Oral BID Adefeso, Oladapo, DO   50 mg at 11/06/22 0932   ondansetron (ZOFRAN) tablet 4 mg  4 mg Oral Q6H PRN Adefeso, Oladapo, DO       Or   ondansetron (ZOFRAN) injection 4 mg  4 mg Intravenous Q6H PRN Adefeso, Oladapo, DO       ranolazine (RANEXA) 12 hr tablet 500 mg  500 mg Oral BID Adefeso, Oladapo, DO   500 mg at 11/06/22 0932   ticagrelor (BRILINTA) tablet 60 mg  60 mg Oral BID Tat, Shanon Brow, MD         Discharge Medications: Please see discharge summary for a list of discharge  medications.  Relevant Imaging Results:  Relevant Lab Results:   Additional Information SSN: [place], LCSW

## 2022-11-06 NOTE — Progress Notes (Signed)
Patient had a syncopal episode while at sink while washing her hands, I was right behind the patient, I used my leg to help prop the patient up.I begin to give the  patient a sternum rub,she opened her eyes.Patient was placed back in bed by staff. I continued to rub her sternum,she became more awake, and also passing gas. Vital signs were stable. Dr Tat notified. Family also notified.Plan of care on going. Nurse at bedside.

## 2022-11-06 NOTE — Progress Notes (Addendum)
PROGRESS NOTE  GENNETTE [name] [id number] DOB: 1935-11-14 DOA: 11/05/2022 PCP: Celene Squibb, MD  Brief History:  87 year old female with a history of chronic respiratory failure on 2 L, atrial fibrillation, IPMN, tachybradycardia syndrome status post PPM, coronary artery disease, hyperlipidemia presenting with confusion and right-sided weakness.  The patient was not able to provide any significant history secondary to her confusion.  History is obtained from review the medical record and speaking with the patient's family.  Notably, the patient was last noted to be normal when she spoke on the phone with her granddaughter 8:30 PM on 11/04/2022.  The patient's granddaughter checked on her at 71 AM on 11/05/2022 and found the patient to be sitting in the chair with her Bible mumbling.  The patient received a call from a doctor's office and while getting up to take the call, the patient had a mechanical fall.  Apparently the patient has fallen 3-4 times in the last 24 hours.  EMS was activated, but the patient refused to come to the hospital.  Later on the day, the patient had an additional fall, and there was concern with the patient having some right-sided weakness and trouble speaking.  EMS was called and code stroke was activated. Notably, the patient was recently admitted to the hospital from 10/27/2022 to 10/30/2022.  The patient was treated for pneumonia with ceftriaxone and doxycycline.  Apparently the patient was having paranoid delusions at that time.  She was evaluated by behavioral health and was not found to have any indication for inpatient psychiatric admission.  She was recommended to go to SNF after PT evaluation, but patient refused and family subsequently took the patient home.   Patient has a history of mixed type IPMN.  She was initially seen on 12/03/2021 by Dr. Laural Golden, and she was found  to have a growing pancreatic cystic lesion with associated abdominal pain.  Due to these she was  referred for endoscopic ultrasound.  Underwent endoscopic ultrasound with Dr. Rush Landmark on 02/24/2022 which found Anechoic lesions suggestive of three cysts were identified in the pancreatic head.  Cytology showed an acellular specimen.  The case was ultimately discussed at multidisciplinary tumor board.   They considered that she was presenting changes concerning for a mixed branch duct/main duct IPMN.  It was considered that given her medical comorbidities she would not be the best surgical candidate but she will continue monitoring. They  decided that she should have a repeat CT pancreas protocol in 6 months.  Depending on the findings on imaging, may need to discuss again the benefits versus risk of undergoing surgical resection.  patient underwent a CT of the abdomen with and without IV contrast on 05/01/2022 which showed decrease size of the cystic lesions of the pancreatic head and uncinate process measuring up to 15 mm.  She was advised to have a repeat CT pancreas protocol in 6 months.  She was last seen in the GI office on 09/25/2022 at which time there were plans for repeat imaging later this year.   Assessment/Plan: Acute metabolic encephalopathy -Multifactorial including electrolyte disturbance, dehydration, and progression of underlying cognitive impairment -Serum B12 2691 -TSH 1.218 -UA negative for pyuria -CT brain negative for acute findings -CTA head and neck negative for LVO -EEG -Check VBG -Ammonia 19 -RPR -Patient remains confused -It does not appear that the patient currently has capacity to make her own decisions -based upon my evaluation of patient and discussion  with family>> Patient is not longer able to make rational decisions regarding her own healthcare and well being.  She does not exhibit understanding of the consequences of her decisions regarding her healthcare and well being.  She continues to impulsive behavior and is not able to make rational decisions regarding her  person, finances or personal property.  Family states that patient continues to have gradual cognitive decline which has impaired the patient's ability to manage her finances and ability to care for herself  and perform appropriate activities of daily living.  Hypokalemia -Replete -Magnesium 2.0  Frequent falls/failure to thrive -PT evaluation -Workup as discussed above  Persistent atrial fibrillation -Rate controlled -Continue amiodarone -Patient has declined anticoagulation in the past  Chronic respiratory failure with hypoxia -She is on 2 L  -Stable  Coronary artery disease -No chest pain presently -Reviewed Dr. Tanna Furry 07/15/22 note--patient has been on Brilinta in the past>> stopped temporarily for generator exchange on her pacemaker -Reviewed Dr. Myles Gip 06/16/2022 note--Brilinta reduced to 60 mg twice daily -Continue Ranexa and metoprolol succinate  Sinus node dysfunction -Status post PPM -Generator exchanged 07/15/2022 -Follow-up Dr. Crissie Sickles  Essential hypertension -Continue amlodipine  Chronic diastolic CHF -Clinically euvolemic -Echo from 06/30/2022 with EF of 55 to 60%, no regional wall motion abnormalities   IPMN -As discussed above in the history -Outpatient follow-up with GI -CA 19-9 was noted to be normal at 14 back in February 2024.  -She will need repeat CT with pancreatic protocol as an outpatient  Anxiety -Patient is on chronic alprazolam -PDMP reviewed--alprazolam 1 mg, #90, last refill 10/04/22  COPD -Continue as needed bronchodilators -Stable    Family Communication:  grand daughter updated 4/4  Consultants:  none  Code Status:  DNR  DVT Prophylaxis:   [place] Lovenox   Procedures: As Listed in Progress Note Above  Antibiotics: None        Subjective: Patient denies fevers, chills, headache, chest pain, dyspnea, nausea, vomiting, diarrhea, abdominal pain, dysuria, hematuria, hematochezia, and  melena.   Objective: Vitals:   11/06/22 0330 11/06/22 0500 11/06/22 0700 11/06/22 0734  BP: (!) 101/49 (!) 101/37 120/68 (!) 145/62  Pulse: 60 (!) 58 66 83  Resp: 18 18 18 19   Temp: 98.5 F (36.9 C) 98.2 F (36.8 C) 98.4 F (36.9 C) 98 F (36.7 C)  TempSrc:    Oral  SpO2: 98% 97% 96% 95%  Weight:      Height:        Intake/Output Summary (Last 24 hours) at 11/06/2022 0820 Last data filed at 11/06/2022 0600 Gross per 24 hour  Intake --  Output 600 ml  Net -600 ml   Weight change:  Exam:  General:  Pt is alert, follows commands appropriately, not in acute distress; intermittently confused HEENT: No icterus, No thrush, No neck mass, [place]/AT Cardiovascular: IRRR, S1/S2, no rubs, no gallops Respiratory: Fine bibasilar rales.  No wheezing Abdomen: Soft/+BS, non tender, non distended, no guarding Extremities: No edema, No lymphangitis, No petechiae, No rashes, no synovitis   Data Reviewed: I have personally reviewed following labs and imaging studies Basic Metabolic Panel: Recent Labs  Lab 11/01/22 0446 11/05/22 1817 11/05/22 1833 11/06/22 0407  NA 136 140 137 139  K 3.4* 3.4* 3.2* 3.0*  CL 100 105 103 106  CO2 23  --  23 25  GLUCOSE 98 98 98 86  BUN 22 14 15 11   CREATININE 1.09* 0.70 0.84 0.66  CALCIUM 9.7  --  9.5 8.9  MG  --   --   --  2.0  PHOS  --   --   --  3.0   Liver Function Tests: Recent Labs  Lab 11/01/22 0446 11/05/22 1833 11/06/22 0407  AST 36 33 26  ALT 26 23 20   ALKPHOS 87 80 68  BILITOT 0.6 1.1 1.3*  PROT 7.9 7.6 6.3*  ALBUMIN 4.2 3.9 3.2*   Recent Labs  Lab 11/01/22 0446  LIPASE 39   Recent Labs  Lab 11/05/22 1833  AMMONIA 19   Coagulation Profile: Recent Labs  Lab 11/01/22 0446 11/05/22 1833  INR 1.0 1.1   CBC: Recent Labs  Lab 11/01/22 0446 11/05/22 1817 11/05/22 1908 11/06/22 0407  WBC 7.9  --  8.6 6.7  NEUTROABS 5.3  --  5.9  --   HGB 13.7 13.6 13.1 11.9*  HCT 41.5 40.0 40.3 36.4  MCV 92.2  --  92.9 94.1  PLT  208  --  208 191   Cardiac Enzymes: No results for input(s): "CKTOTAL", "CKMB", "CKMBINDEX", "TROPONINI" in the last 168 hours. BNP: Invalid input(s): "POCBNP" CBG: No results for input(s): "GLUCAP" in the last 168 hours. HbA1C: No results for input(s): "HGBA1C" in the last 72 hours. Urine analysis:    Component Value Date/Time   COLORURINE YELLOW 11/05/2022 2210   APPEARANCEUR HAZY (A) 11/05/2022 2210   LABSPEC 1.023 11/05/2022 2210   PHURINE 8.0 11/05/2022 2210   GLUCOSEU >=500 (A) 11/05/2022 2210   HGBUR NEGATIVE 11/05/2022 2210   BILIRUBINUR NEGATIVE 11/05/2022 2210   KETONESUR 5 (A) 11/05/2022 2210   PROTEINUR NEGATIVE 11/05/2022 2210   UROBILINOGEN 0.2 12/07/2009 1532   NITRITE NEGATIVE 11/05/2022 2210   LEUKOCYTESUR TRACE (A) 11/05/2022 2210   Sepsis Labs: @LABRCNTIP (procalcitonin:4,lacticidven:4) ) Recent Results (from the past 240 hour(s))  Blood culture (routine x 2)     Status: None   Collection Time: 10/27/22 11:53 PM   Specimen: BLOOD  Result Value Ref Range Status   Specimen Description BLOOD BLOOD RIGHT ARM  Final   Special Requests   Final    BOTTLES DRAWN AEROBIC AND ANAEROBIC Blood Culture adequate volume   Culture   Final    NO GROWTH 5 DAYS Performed at [facility], [address], [place], [place] [address]    Report Status 11/02/2022 FINAL  Final  Blood culture (routine x 2)     Status: None   Collection Time: 10/27/22 11:53 PM   Specimen: BLOOD  Result Value Ref Range Status   Specimen Description BLOOD BLOOD RIGHT HAND  Final   Special Requests   Final    BOTTLES DRAWN AEROBIC AND ANAEROBIC Blood Culture results may not be optimal due to an excessive volume of blood received in culture bottles   Culture   Final    NO GROWTH 5 DAYS Performed at [facility], [address], [place], [place] [address]    Report Status 11/02/2022 FINAL  Final  Blood Culture (routine x 2)     Status: None (Preliminary result)   Collection Time: 11/01/22  5:30  AM   Specimen: BLOOD RIGHT FOREARM  Result Value Ref Range Status   Specimen Description   Final    BLOOD RIGHT FOREARM BOTTLES DRAWN AEROBIC AND ANAEROBIC Performed at [facility], [address] [place], [place] [address]    Special Requests   Final    Blood Culture adequate volume Performed at [facility], [address], [place], [place] [address]    Culture   Final    NO GROWTH 4 DAYS Performed at  [facility], [place] [address], [place], [place] [address]    Report Status PENDING  Incomplete  Blood Culture (routine x 2)     Status: None (Preliminary result)   Collection Time: 11/01/22  5:38 AM   Specimen: BLOOD RIGHT HAND  Result Value Ref Range Status   Specimen Description   Final    BLOOD RIGHT HAND BOTTLES DRAWN AEROBIC AND ANAEROBIC Performed at [facility], [address] [place], [place] [address]    Special Requests   Final    Blood Culture adequate volume Performed at [facility], [address] [place], [place] [address]    Culture   Final    NO GROWTH 4 DAYS Performed at [facility], [place] [address] [place], [place] [address]    Report Status PENDING  Incomplete  Blood culture (routine x 2)     Status: None (Preliminary result)   Collection Time: 11/05/22  8:23 PM   Specimen: BLOOD RIGHT FOREARM  Result Value Ref Range Status   Specimen Description BLOOD RIGHT FOREARM  Final   Special Requests   Final    BOTTLES DRAWN AEROBIC AND ANAEROBIC Blood Culture adequate volume Performed at [facility], [address] [place], [place] [address]    Culture PENDING  Incomplete   Report Status PENDING  Incomplete  Blood culture (routine x 2)     Status: None (Preliminary result)   Collection Time: 11/05/22  8:28 PM   Specimen: BLOOD RIGHT FOREARM  Result Value Ref Range Status   Specimen Description BLOOD RIGHT FOREARM  Final   Special Requests   Final    BOTTLES DRAWN AEROBIC ONLY Blood Culture adequate volume Performed at [facility],  [address] [place], [place] [address]    Culture PENDING  Incomplete   Report Status PENDING  Incomplete     Scheduled Meds:  amiodarone  200 mg Oral See admin instructions   aspirin EC  81 mg Oral Q breakfast   enoxaparin (LOVENOX) injection  40 mg Subcutaneous Q24H   isosorbide mononitrate  30 mg Oral Daily   metoprolol succinate  50 mg Oral BID   potassium chloride  40 mEq Oral Once   ranolazine  500 mg Oral BID   Continuous Infusions:  Procedures/Studies: DG Chest Port 1 View  Result Date: 11/05/2022 CLINICAL DATA:  Weakness, coronary artery disease, COPD EXAM: PORTABLE CHEST 1 VIEW COMPARISON:  11/01/2022 FINDINGS: The lungs are symmetrically well expanded. No pneumothorax or pleural effusion. Mild progressive central pulmonary vascular congestion and trace diffuse interstitial pulmonary edema, likely cardiogenic in nature, are new since prior examination. Cardiac size is within normal limits. Tortuosity and ectasia of the thoracic aorta is noted, better assessed on CT examination of 11/01/2022. Left subclavian dual lead pacemaker is unchanged. IMPRESSION: 1. Developing, mild cardiogenic failure. 2. Tortuosity and ectasia of the thoracic aorta. Electronically Signed   By: Fidela Salisbury M.D.   On: 11/05/2022 20:04   CT ANGIO HEAD NECK W WO CM W PERF (CODE STROKE)  Result Date: 11/05/2022 CLINICAL DATA:  Neuro deficit with acute stroke suspected. Right-sided weakness and facial droop EXAM: CT ANGIOGRAPHY HEAD AND NECK CT PERFUSION BRAIN TECHNIQUE: Multidetector CT imaging of the head and neck was performed using the standard protocol during bolus administration of intravenous contrast. Multiplanar CT image reconstructions and MIPs were obtained to evaluate the vascular anatomy. Carotid stenosis measurements (when applicable) are obtained utilizing NASCET criteria, using the distal internal carotid diameter as the  denominator. Multiphase CT imaging of the brain was performed following IV bolus  contrast injection. Subsequent parametric perfusion maps were calculated using RAPID software. RADIATION DOSE REDUCTION: This exam was performed according to the departmental dose-optimization program which includes automated exposure control, adjustment of the mA and/or kV according to patient size and/or use of iterative reconstruction technique. CONTRAST:  125mL OMNIPAQUE IOHEXOL 350 MG/ML SOLN COMPARISON:  Head CT from earlier today. FINDINGS: Aortic arch: Atheromatous plaque.  Three vessel branching Right carotid system: Atheromatous plaque at the bifurcation. High-grade stenosis is suspected although there is limited quantification due to more shin artifact. Narrowing is over 50%. Left carotid system: Atheromatous plaque at the common carotid origin and at the bifurcation without flow reducing stenosis or ulceration. Vertebral arteries: Calcified plaque at the proximal right subclavian causes 70% stenosis as measured on coronal reformats. Advanced narrowing at the right vertebral origin due to atheromatous plaque. Moderate narrowing at the left vertebral origin. No dissection or beading. Skeleton: Ordinary cervical spine degeneration Other neck: No acute finding Upper chest: Emphysema. Review of the MIP images confirms the above findings CTA HEAD FINDINGS Anterior circulation: Limited by motion. Extensive atheromatous calcification of the cavernous carotids. There may be significant narrowing at the right MCA bifurcation, although degraded by motion. No emergent large vessel occlusion Posterior circulation: Right dominant vertebral artery. Before atheromatous plaque with high-grade narrowing affecting the right more than left vertebral artery. Moderately extensive atheromatous irregularity of the basilar. Severe left P1 segment stenosis with downstream underfilling. Bilateral PCA atheromatous irregularity is extensive. Venous sinuses: Unremarkable. CT Brain Perfusion Findings: ASPECTS: 10 CBF (<30%) Volume:  64mL Perfusion (Tmax>6.0s) volume: 65mL There is notable motion artifact. IMPRESSION: 1. Limited study due to motion artifact. 2. Severe narrowing at the left P1 segment. On coronal thick MIPS there is question of luminal filling defect but given the degree of motion this may instead be atheromatous. 3. Right ICA origin stenosis that is likely flow reducing with possible downstream underfilling. 4. 70% narrowing at the right subclavian origin. 5. High-grade narrowing of the right vertebral artery at its origin and vertebrobasilar junction. Advanced narrowing of the non dominant left V4 segment as well. 6. Moderate distal basilar stenosis. 7. Severe PCA atheromatous irregularity. 8. Noncontributory CT perfusion. Electronically Signed   By: Jorje Guild M.D.   On: 11/05/2022 19:26   CT Cervical Spine Wo Contrast  Result Date: 11/05/2022 CLINICAL DATA:  Trauma, multiple falls EXAM: CT CERVICAL SPINE WITHOUT CONTRAST TECHNIQUE: Multidetector CT imaging of the cervical spine was performed without intravenous contrast. Multiplanar CT image reconstructions were also generated. RADIATION DOSE REDUCTION: This exam was performed according to the departmental dose-optimization program which includes automated exposure control, adjustment of the mA and/or kV according to patient size and/or use of iterative reconstruction technique. COMPARISON:  11/01/2022 FINDINGS: There is patient motion in some of the images limiting the study. Alignment: Alignment of posterior margins of vertebral bodies is within normal limits. Skull base and vertebrae: No recent fracture is seen. Soft tissues and spinal canal: Posterior bony spurs causing mild extrinsic pressure on the ventral margin of thecal sac from C3 to C6 levels. Disc levels: There is minimal encroachment of right neural foramen at C2-C3 level. There is mild encroachment of neural foramina from C3-C7 levels. Upper chest: Linear patchy pleural densities are seen in both apices.  Other: Thyroid is enlarged with inhomogeneous attenuation. IMPRESSION: Motion limited study. No recent fracture is seen in cervical spine. Cervical spondylosis with encroachment of neural foramina at  multiple levels. There is mild extrinsic pressure over the ventral margin of thecal sac caused by posterior bony spurs without significant central spinal stenosis. Thyroid is enlarged with inhomogeneous attenuation. Nonemergent thyroid sonogram may be considered. Electronically Signed   By: Elmer Picker M.D.   On: 11/05/2022 19:17   CT HEAD CODE STROKE WO CONTRAST  Result Date: 11/05/2022 CLINICAL DATA:  Code stroke. Neuro deficit with acute stroke suspected EXAM: CT HEAD WITHOUT CONTRAST TECHNIQUE: Contiguous axial images were obtained from the base of the skull through the vertex without intravenous contrast. RADIATION DOSE REDUCTION: This exam was performed according to the departmental dose-optimization program which includes automated exposure control, adjustment of the mA and/or kV according to patient size and/or use of iterative reconstruction technique. COMPARISON:  Head CT 11/01/2022 FINDINGS: Brain: No evidence of acute infarction, hemorrhage, hydrocephalus, extra-axial collection or mass lesion/mass effect. Chronic small vessel ischemia with chronic perforator infarct at the left corona radiata. Vascular: No hyperdense vessel or unexpected calcification. Skull: Normal. Negative for fracture or focal lesion. Sinuses/Orbits: No acute finding. Other: Prelim sent in epic chat. ASPECTS [facility] Stroke Program Early CT Score) Not scored without localizing history. IMPRESSION: No acute or interval finding. Electronically Signed   By: Jorje Guild M.D.   On: 11/05/2022 18:32   CT CHEST ABDOMEN PELVIS W CONTRAST  Result Date: 11/01/2022 CLINICAL DATA:  Unwitnessed fall.  Blunt poly trauma. EXAM: CT CHEST, ABDOMEN, AND PELVIS WITH CONTRAST TECHNIQUE: Multidetector CT imaging of the chest, abdomen and  pelvis was performed following the standard protocol during bolus administration of intravenous contrast. RADIATION DOSE REDUCTION: This exam was performed according to the departmental dose-optimization program which includes automated exposure control, adjustment of the mA and/or kV according to patient size and/or use of iterative reconstruction technique. CONTRAST:  149mL OMNIPAQUE IOHEXOL 300 MG/ML  SOLN COMPARISON:  06/29/2022 FINDINGS: CT CHEST FINDINGS Cardiovascular: No significant vascular findings. Normal heart size. No pericardial effusion. Dual-chamber pacer leads in unremarkable position. Atheromatous calcification of the aorta and coronaries. Mediastinum/Nodes: No hematoma or pneumomediastinum Lungs/Pleura: There is no edema, consolidation, effusion, or pneumothorax. Musculoskeletal: No acute finding CT ABDOMEN PELVIS FINDINGS Hepatobiliary: No hepatic injury or perihepatic hematoma. Gallbladder is surgically absent Pancreas: Prominent main duct size (up to 3 mm) with multiple cystic densities in the head and body, measuring up to 9 mm at the uncinate process. MRCP was recommended 05/01/2022, follow-up to depend on comorbidities. No acute finding. Spleen: Coarse calcifications.  No visible injury Adrenals/Urinary Tract: Left renal cortical scarring. Stomach/Bowel: No evidence of injury Vascular/Lymphatic: No evidence of injury. Extensive atheromatous calcification. Reproductive: Hysterectomy. Other: No ascites or pneumoperitoneum Musculoskeletal: Generalized osteopenia. Chronic L1 inferior endplate fracture. Generalized osteopenia. No evidence of acute injury. IMPRESSION: 1. No evidence of injury to the chest or abdomen. 2. Chronic findings, including cystic pancreas lesions, are stable from previous imaging and described above. Electronically Signed   By: Jorje Guild M.D.   On: 11/01/2022 06:22   DG Pelvis Portable  Result Date: 11/01/2022 CLINICAL DATA:  Fall EXAM: PORTABLE PELVIS 1 VIEWS  COMPARISON:  10/07/2022 FINDINGS: There is no evidence of pelvic fracture or diastasis. No pelvic bone lesions are seen. Generalized osteopenia. IMPRESSION: Negative. Electronically Signed   By: Jorje Guild M.D.   On: 11/01/2022 05:29   CT HEAD WO CONTRAST (5MM)  Result Date: 11/01/2022 CLINICAL DATA:  Head trauma, minor.  Unwitnessed fall. EXAM: CT HEAD WITHOUT CONTRAST CT CERVICAL SPINE WITHOUT CONTRAST TECHNIQUE: Multidetector CT imaging of the head and  cervical spine was performed following the standard protocol without intravenous contrast. Multiplanar CT image reconstructions of the cervical spine were also generated. RADIATION DOSE REDUCTION: This exam was performed according to the departmental dose-optimization program which includes automated exposure control, adjustment of the mA and/or kV according to patient size and/or use of iterative reconstruction technique. COMPARISON:  Five days ago FINDINGS: CT HEAD FINDINGS Brain: No evidence of acute infarction, hemorrhage, hydrocephalus, extra-axial collection or mass lesion/mass effect. Chronic small vessel ischemia in the cerebral white matter with chronic perforator infarct at the left basal ganglia. Vascular: No hyperdense vessel or unexpected calcification. Skull: Normal. Negative for fracture or focal lesion. Sinuses/Orbits: No evidence of injury. CT CERVICAL SPINE FINDINGS Alignment: Normal. Skull base and vertebrae: No acute fracture. No primary bone lesion or focal pathologic process. Soft tissues and spinal canal: No prevertebral fluid or swelling. No visible canal hematoma. Disc levels:  Ordinary and generalized cervical spine degeneration Upper chest: Biapical scarring with no evidence of injury. IMPRESSION: No evidence of acute intracranial or cervical spine injury. Electronically Signed   By: Jorje Guild M.D.   On: 11/01/2022 05:28   CT CERVICAL SPINE WO CONTRAST  Result Date: 11/01/2022 CLINICAL DATA:  Head trauma, minor.   Unwitnessed fall. EXAM: CT HEAD WITHOUT CONTRAST CT CERVICAL SPINE WITHOUT CONTRAST TECHNIQUE: Multidetector CT imaging of the head and cervical spine was performed following the standard protocol without intravenous contrast. Multiplanar CT image reconstructions of the cervical spine were also generated. RADIATION DOSE REDUCTION: This exam was performed according to the departmental dose-optimization program which includes automated exposure control, adjustment of the mA and/or kV according to patient size and/or use of iterative reconstruction technique. COMPARISON:  Five days ago FINDINGS: CT HEAD FINDINGS Brain: No evidence of acute infarction, hemorrhage, hydrocephalus, extra-axial collection or mass lesion/mass effect. Chronic small vessel ischemia in the cerebral white matter with chronic perforator infarct at the left basal ganglia. Vascular: No hyperdense vessel or unexpected calcification. Skull: Normal. Negative for fracture or focal lesion. Sinuses/Orbits: No evidence of injury. CT CERVICAL SPINE FINDINGS Alignment: Normal. Skull base and vertebrae: No acute fracture. No primary bone lesion or focal pathologic process. Soft tissues and spinal canal: No prevertebral fluid or swelling. No visible canal hematoma. Disc levels:  Ordinary and generalized cervical spine degeneration Upper chest: Biapical scarring with no evidence of injury. IMPRESSION: No evidence of acute intracranial or cervical spine injury. Electronically Signed   By: Jorje Guild M.D.   On: 11/01/2022 05:28   DG Chest Port 1 View  Result Date: 11/01/2022 CLINICAL DATA:  86 year old female with possible sepsis. EXAM: PORTABLE CHEST 1 VIEW COMPARISON:  Chest x-ray 10/27/2022. FINDINGS: Lung volumes are very low. Low opacity at the left base favored to reflect small left pleural effusion with associated passive subsegmental atelectasis in the left lower lobe. No definite consolidative airspace disease. No right pleural effusion. No  pneumothorax. No evidence of pulmonary edema. Heart size is normal. The patient is severely rotated to the left on today's exam, resulting in distortion of the mediastinal contours and reduced diagnostic sensitivity and specificity for mediastinal pathology. Atherosclerotic calcifications in the thoracic aorta. Left-sided pacemaker device in place with lead tips projecting over the expected location of the right atrium and right ventricle. Soft tissue anchor in the right humeral head incidentally noted. IMPRESSION: 1. Low lung volumes with small left pleural effusion and probable subsegmental atelectasis in the left lung base. 2. Aortic atherosclerosis. Electronically Signed   By: Mauri Brooklyn.D.  On: 11/01/2022 05:26   DG Chest 2 View  Result Date: 10/27/2022 CLINICAL DATA:  Altered mental status EXAM: CHEST - 2 VIEW COMPARISON:  06/29/2022 FINDINGS: Left-sided pacing device as before. Cardiomegaly. Heterogeneous ground-glass opacities in the right upper lobe and left mid to lower lung. Aortic atherosclerosis. No pneumothorax. IMPRESSION: Heterogeneous ground-glass opacities in the right upper lobe and left mid to lower lung, suspect for multifocal pneumonia. Cardiomegaly. Electronically Signed   By: Donavan Foil M.D.   On: 10/27/2022 21:58   CT Head Wo Contrast  Result Date: 10/27/2022 CLINICAL DATA:  Altered mental status EXAM: CT HEAD WITHOUT CONTRAST TECHNIQUE: Contiguous axial images were obtained from the base of the skull through the vertex without intravenous contrast. RADIATION DOSE REDUCTION: This exam was performed according to the departmental dose-optimization program which includes automated exposure control, adjustment of the mA and/or kV according to patient size and/or use of iterative reconstruction technique. COMPARISON:  CT brain 12/11/2021 FINDINGS: Brain: No acute territorial infarction, hemorrhage or intracranial mass. Probable chronic lacunar infarct left basal ganglia.  Patchy white matter hypodensity consistent with chronic small vessel ischemic change. Mild atrophy. Nonenlarged ventricles Vascular: No hyperdense vessels.  Carotid vascular calcification Skull: Normal. Negative for fracture or focal lesion. Sinuses/Orbits: No acute finding. Other: None IMPRESSION: 1. No CT evidence for acute intracranial abnormality. 2. Atrophy and chronic small vessel ischemic changes of the white matter. Electronically Signed   By: Donavan Foil M.D.   On: 10/27/2022 21:32   CUP PACEART REMOTE DEVICE CHECK  Result Date: 10/23/2022 Scheduled remote reviewed. Normal device function.  Next remote 91 days. LA  CT PANCREAS ABD W/WO  Result Date: 10/08/2022 CLINICAL DATA:  Follow-up pancreatic lesions. EXAM: CT ABDOMEN WITHOUT AND WITH CONTRAST TECHNIQUE: Multidetector CT imaging of the abdomen was performed following the standard protocol before and following the bolus administration of intravenous contrast. RADIATION DOSE REDUCTION: This exam was performed according to the departmental dose-optimization program which includes automated exposure control, adjustment of the mA and/or kV according to patient size and/or use of iterative reconstruction technique. CONTRAST:  121mL OMNIPAQUE IOHEXOL 300 MG/ML  SOLN COMPARISON:  Multiple priors including CT June 29, 2022 and May 01, 2022. FINDINGS: Lower chest: No acute abnormality. Right-sided cardiac enlargement. Partially visualized intracardiac leads. Calcifications of the mitral annulus. Hepatobiliary: Calcified hepatic granulomata. No suspicious hepatic lesion. Gallbladder surgically absent. No biliary ductal dilation. Pancreas: Overall similar size and appearance of the multiple pancreatic cystic lesions: For reference: -cystic lesion in the central pancreatic head adjacent to the portal confluence measures 17 x 12 mm on image 51/11 previously 15 x 10 mm. -cystic lesion in the pancreatic uncinate process measures 12 x 8 mm on image  58/11 previously 13 x 9 mm -cystic lesion in the inferior aspect of the pancreatic uncinate process measures 8 mm on image 67/11, unchanged. -cystic lesion in the superior aspect of the pancreatic head measuring 6 mm on image 40/11, unchanged when remeasured for consistency -Adjacent cystic lesion in the superior aspect of the pancreatic head measures 9 x 5 mm on image 38/11 previously 10 x 6 mm when remeasured for consistency. Similar pancreatic atrophy in the body and tail with prominence of the pancreatic duct in the head of the pancreas measuring 3.5 cm on image 47/11. No evidence of acute pancreatic inflammation. Spleen: Calcified splenic granulomata. Adrenals/Urinary Tract: Bilateral adrenal glands appear normal. Left lower pole renal scarring. Kidneys demonstrate symmetric enhancement and excretion of contrast material. Stomach/Bowel: No radiopaque enteric contrast material  was administered. Stomach is unremarkable for degree of distension. No pathologic dilation or evidence of acute inflammation involving loops of large or small bowel in the abdomen. Vascular/Lymphatic: Aortic atherosclerosis. Smooth IVC contours. No pathologically enlarged abdominal or pelvic lymph nodes. Other: No significant abdominal free fluid. Musculoskeletal: Diffuse demineralization of bone. Multilevel degenerative changes spine with multilevel vacuum disc artifact. No acute osseous abnormality. IMPRESSION: 1. Overall similar size and appearance of the multiple pancreatic cystic lesions measuring up to 17 mm in the central pancreatic head adjacent to the portal confluence. Recommend follow up imaging in 6 months preferably with pre and postcontrast enhanced MRI/MRCP. Otherwise continued surveillance with pre and postcontrast enhanced pancreatic protocol CT. This recommendation follows ACR consensus guidelines: Management of Incidental Pancreatic Cysts: A White Paper of the ACR Incidental Findings Committee. J Am Coll Radiol  [id number]. 2. Similar pancreatic atrophy with prominence of the pancreatic duct in the head of the pancreas measuring 3.5 mm. 3. No evidence of acute pancreatic inflammation. 4.  Aortic Atherosclerosis (ICD10-I70.0). Electronically Signed   By: Dahlia Bailiff M.D.   On: 10/08/2022 13:47    Orson Eva, DO  [facility]  If 7PM-7AM, please contact night-coverage [email or web address] Password [email or web address] 11/06/2022, 8:20 AM   LOS: 0 days
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
## 2022-11-06 NOTE — Hospital Course (Addendum)
87 year old female with a history of chronic respiratory failure on 2 L, atrial fibrillation, IPMN, tachybradycardia syndrome status post PPM, coronary artery disease, hyperlipidemia presenting with confusion and right-sided weakness.  The patient was not able to provide any significant history secondary to her confusion.  History is obtained from review the medical record and speaking with the patient's family.  Notably, the patient was last noted to be normal when she spoke on the phone with her granddaughter 8:30 PM on 11/04/2022.  The patient's granddaughter checked on her at 77 AM on 11/05/2022 and found the patient to be sitting in the chair with her Bible mumbling.  The patient received a call from a doctor's office and while getting up to take the call, the patient had a mechanical fall.  Apparently the patient has fallen 3-4 times in the last 24 hours.  EMS was activated, but the patient refused to come to the hospital.  Later on the day, the patient had an additional fall, and there was concern with the patient having some right-sided weakness and trouble speaking.  EMS was called and code stroke was activated. Notably, the patient was recently admitted to the hospital from 10/27/2022 to 10/30/2022.  The patient was treated for pneumonia with ceftriaxone and doxycycline.  Apparently the patient was having paranoid delusions at that time.  She was evaluated by behavioral health and was not found to have any indication for inpatient psychiatric admission.  She was recommended to go to SNF after PT evaluation, but patient refused and family subsequently took the patient home. In speaking with the patient's granddaughter, it appears that [name] is the patient's main advocate and caretaker. [name] states that the patient has not been taking her medications properly.  The patient frequently forgets to take her medications.  In the last 6 months, the patient has had a cognitive decline with increasing  paranoid behavior.  In addition, the patient the patient has also had some impulsive behavior.  Patient has a history of mixed type IPMN.  She was initially seen on 12/03/2021 by [name], and she was found  to have a growing pancreatic cystic lesion with associated abdominal pain.  Due to these she was referred for endoscopic ultrasound.  Underwent endoscopic ultrasound with [name] on 02/24/2022 which found Anechoic lesions suggestive of three cysts were identified in the pancreatic head.  Cytology showed an acellular specimen.  The case was ultimately discussed at multidisciplinary tumor board.   They considered that she was presenting changes concerning for a mixed branch duct/main duct IPMN.  It was considered that given her medical comorbidities she would not be the best surgical candidate but she will continue monitoring. They  decided that she should have a repeat CT pancreas protocol in 6 months.  Depending on the findings on imaging, may need to discuss again the benefits versus risk of undergoing surgical resection.  patient underwent a CT of the abdomen with and without IV contrast on 05/01/2022 which showed decrease size of the cystic lesions of the pancreatic head and uncinate process measuring up to 15 mm.  She was advised to have a repeat CT pancreas protocol in 6 months.  She was last seen in the GI office on 09/25/2022 at which time there were plans for repeat imaging later this year.  Throughout the hospitalization, the patient remained alert and oriented, although not fully to time.  She would intermittently become agitated.  She remained intermittently confused and have episodes of  paranoia.  Clearly, the patient continued to not exhibit capacity to make decisions regarding her own person, property, or healthcare.  She continued to lack the insight to understand the consequences of her decisions.  Unfortunately, the patient's hospitalization was prolonged secondary to the requirement  for the patient's granddaughter to obtain guardianship.  This guardianship was required to facilitate the patient's transition to short-term rehab as the patient continued to refuse, but did not have the insight to make her own medical decisions.  Fortunately, the patient remained medically stable.  Guardianship was ultimately obtained on 11/11/2022.  Her discharge was delayed until 11/12/2022 secondary to requirement of having 24 hours without a sitter.

## 2022-11-07 ENCOUNTER — Ambulatory Visit: Payer: Self-pay | Admitting: *Deleted

## 2022-11-07 ENCOUNTER — Encounter: Payer: Self-pay | Admitting: *Deleted

## 2022-11-07 DIAGNOSIS — R531 Weakness: Secondary | ICD-10-CM | POA: Diagnosis not present

## 2022-11-07 DIAGNOSIS — I495 Sick sinus syndrome: Secondary | ICD-10-CM | POA: Diagnosis not present

## 2022-11-07 DIAGNOSIS — I10 Essential (primary) hypertension: Secondary | ICD-10-CM | POA: Diagnosis not present

## 2022-11-07 DIAGNOSIS — I482 Chronic atrial fibrillation, unspecified: Secondary | ICD-10-CM | POA: Diagnosis not present

## 2022-11-07 LAB — BASIC METABOLIC PANEL
Anion gap: 7 (ref 5–15)
Anion gap: 7 (ref 5–15)
BUN: 11 mg/dL (ref 8–23)
BUN: 13 mg/dL (ref 8–23)
CO2: 23 mmol/L (ref 22–32)
CO2: 24 mmol/L (ref 22–32)
Calcium: 9 mg/dL (ref 8.9–10.3)
Calcium: 9 mg/dL (ref 8.9–10.3)
Chloride: 107 mmol/L (ref 98–111)
Chloride: 108 mmol/L (ref 98–111)
Creatinine, Ser: 0.72 mg/dL (ref 0.44–1.00)
Creatinine, Ser: 0.8 mg/dL (ref 0.44–1.00)
GFR, Estimated: >60 mL/min (ref >60)
GFR, Estimated: >60 mL/min (ref >60)
Glucose, Bld: 84 mg/dL (ref 70–99)
Glucose, Bld: 91 mg/dL (ref 70–99)
Potassium: 3.4 mmol/L — ABNORMAL LOW (ref 3.5–5.1)
Potassium: 3.6 mmol/L (ref 3.5–5.1)
Sodium: 137 mmol/L (ref 135–145)
Sodium: 139 mmol/L (ref 135–145)

## 2022-11-07 LAB — RPR: RPR Ser Ql: NONREACTIVE

## 2022-11-07 LAB — MAGNESIUM: Magnesium: 2 mg/dL (ref 1.7–2.4)

## 2022-11-07 LAB — GLUCOSE, CAPILLARY: Glucose-Capillary: 111 mg/dL — ABNORMAL HIGH (ref 70–99)

## 2022-11-07 MED ORDER — FOLIC ACID 1 MG PO TABS
1.0000 mg | ORAL_TABLET | Freq: Every day | ORAL | Status: DC
  Administered 2022-11-07 – 2022-11-12 (×6): 1 mg via ORAL
  Filled 2022-11-07 (×6): qty 1

## 2022-11-07 MED ORDER — HALOPERIDOL LACTATE 5 MG/ML IJ SOLN
2.0000 mg | Freq: Once | INTRAMUSCULAR | Status: AC
  Administered 2022-11-07: 2 mg via INTRAMUSCULAR
  Filled 2022-11-07: qty 1

## 2022-11-07 MED ORDER — POTASSIUM CHLORIDE 10 MEQ/100ML IV SOLN
10.0000 meq | INTRAVENOUS | Status: AC
  Administered 2022-11-07 (×2): 10 meq via INTRAVENOUS
  Filled 2022-11-07 (×2): qty 100

## 2022-11-07 NOTE — TOC Progression Note (Signed)
Transition of Care [facility]) - Progression Note    Patient Details  Name: [name] MRN: [id number] Date of Birth: 1935/08/11  Transition of [facility]) CM/SW Contact  Annice Needy, [place] Phone Number: 11/07/2022, 12:01 PM  Clinical Narrative:    Granddaughter, April, indicates that she completed paperwork for guardianship and interim guardianship hearing is 4/9 and full guardianship hearing is on 5/1.    Expected Discharge Plan: Skilled Nursing Facility Barriers to Discharge: English as a second language teacher, Unsafe home situation, SNF Pending bed offer, Continued Medical Work up  Expected Discharge Plan and Services     Post Acute Care Choice: Skilled Nursing Facility Living arrangements for the past 2 months: Single Family Home                                       Social Determinants of Health (SDOH) Interventions SDOH Screenings   Food Insecurity: No Food Insecurity (11/06/2022)  Housing: Low Risk  (11/06/2022)  Transportation Needs: No Transportation Needs (11/06/2022)  Utilities: At Risk (11/06/2022)  Alcohol Screen: Low Risk  (11/05/2022)  Depression (PHQ2-9): Low Risk  (11/05/2022)  Financial Resource Strain: Low Risk  (11/05/2022)  Physical Activity: Inactive (11/05/2022)  Social Connections: Moderately Integrated (11/05/2022)  Stress: No Stress Concern Present (11/05/2022)  Tobacco Use: Medium Risk (11/07/2022)    Readmission Risk Interventions    07/02/2022   12:34 PM  Readmission Risk Prevention Plan  Transportation Screening Complete  PCP or Specialist Appt within 5-7 Days Complete  Home Care Screening Complete  Medication Review (RN CM) Complete

## 2022-11-07 NOTE — Patient Outreach (Signed)
  Care Coordination   Follow Up Visit Note   11/07/2022 Name: JAQUETTE REGIER MRN: [id number] DOB: [birth year]  CHLORIS ZUKAUSKAS is a 87 y.o. year old female who sees Margo Aye, Kathleene Hazel, MD for primary care. I spoke with April, grand daughter of TAYELOR FABIEN by phone today.  What matters to the patients health and wellness today?  Follow up outreach after a message left by April April reports she was tearful on 11/06/22 about how Mrs Pustejovsky is noted to present during her visit to see her at [facility] April reports she is on her way to [employer] to get help with forms for Mrs Cisek. April expresses concern with Mrs Lumia becoming upset with her after the forms are completed. April indicates that Bonita Quin, Mrs Leachman's daughter is still not actively participating in the care of Mrs Heavrin. April expresses the concern of making sure Mrs Mcneish is safe    Goals Addressed             This Visit's Progress    [facility] care coordination services       Interventions Today    Flowsheet Row Most Recent Value  Chronic Disease   Chronic disease during today's visit Other  [falls recently x 4 after becoming dizzy, admission to [facility]22, Returned a call to April after a message was left on 11/06/22,]  General Interventions   General Interventions Discussed/Reviewed General Interventions Discussed, Communication with, [employer]  Doctor Visits Discussed/Reviewed PCP, Specialist  [confirmed patient has been seen by pcp, neurology during 11/05/22 admission,]  Communication with Social Work  [confirmed April has spoken with [facility] SW 11/06/22 and is contact with the [facility] hospital SW. Collaborated with [facility] SW while on this 11/07/22 call with April.  Mardene Celeste assisting April with guardianship process (not IVC as misunderstood by RN CM )]  Education Interventions   Education Provided Provided Education  [facility] process, release of records, safety for the patient]  Safety Interventions   Safety Discussed/Reviewed  Safety Reviewed, Fall Risk              SDOH assessments and interventions completed:  No     Care Coordination Interventions:  Yes, provided   Follow up plan: Follow up call scheduled for 11/10/22    Encounter Outcome:  Pt. Visit Completed   Joslynn Jamroz L. Noelle Penner, RN, BSN, CCM [facility] Care Management Community Coordinator Office number [phone]
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
## 2022-11-07 NOTE — Progress Notes (Addendum)
PROGRESS NOTE  [name] YNW:[id number] DOB: February 26, 1936 DOA: 11/05/2022 PCP: Katrina Stabile, MD  Brief History:  87 year old female with a history of chronic respiratory failure on 2 L, atrial fibrillation, IPMN, tachybradycardia syndrome status post PPM, coronary artery disease, hyperlipidemia presenting with confusion and right-sided weakness.  The patient was not able to provide any significant history secondary to her confusion.  History is obtained from review the medical record and speaking with the patient's family.  Notably, the patient was last noted to be normal when she spoke on the phone with her granddaughter 8:30 PM on 11/04/2022.  The patient's granddaughter checked on her at 54 AM on 11/05/2022 and found the patient to be sitting in the chair with her Bible mumbling.  The patient received a call from a doctor's office and while getting up to take the call, the patient had a mechanical fall.  Apparently the patient has fallen 3-4 times in the last 24 hours.  EMS was activated, but the patient refused to come to the hospital.  Later on the day, the patient had an additional fall, and there was concern with the patient having some right-sided weakness and trouble speaking.  EMS was called and code stroke was activated. Notably, the patient was recently admitted to the hospital from 10/27/2022 to 10/30/2022.  The patient was treated for pneumonia with ceftriaxone and doxycycline.  Apparently the patient was having paranoid delusions at that time.  She was evaluated by behavioral health and was not found to have any indication for inpatient psychiatric admission.  She was recommended to go to SNF after PT evaluation, but patient refused and family subsequently took the patient home. In speaking with the patient's granddaughter, it appears that [name] is the patient's main advocate and caretaker. Ms. [name] states that the patient has not been taking her medications properly.  The  patient frequently forgets to take her medications.  In the last 6 months, the patient has had a cognitive decline with increasing paranoid behavior.  In addition, the patient the patient has also had some impulsive behavior.  Patient has a history of mixed type IPMN.  She was initially seen on 12/03/2021 by [name], and she was found  to have a growing pancreatic cystic lesion with associated abdominal pain.  Due to these she was referred for endoscopic ultrasound.  Underwent endoscopic ultrasound with [name] on 02/24/2022 which found Anechoic lesions suggestive of three cysts were identified in the pancreatic head.  Cytology showed an acellular specimen.  The case was ultimately discussed at multidisciplinary tumor board.   They considered that she was presenting changes concerning for a mixed branch duct/main duct IPMN.  It was considered that given her medical comorbidities she would not be the best surgical candidate but she will continue monitoring. They  decided that she should have a repeat CT pancreas protocol in 6 months.  Depending on the findings on imaging, may need to discuss again the benefits versus risk of undergoing surgical resection.  patient underwent a CT of the abdomen with and without IV contrast on 05/01/2022 which showed decrease size of the cystic lesions of the pancreatic head and uncinate process measuring up to 15 mm.  She was advised to have a repeat CT pancreas protocol in 6 months.  She was last seen in the GI office on 09/25/2022 at which time there were plans for repeat imaging later this year.  Assessment/Plan: Acute metabolic encephalopathy -Multifactorial including electrolyte disturbance, dehydration, and progression of underlying cognitive impairment -Serum [id number] -TSH 1.218 -UA negative for pyuria -4/3 CT brain negative for acute findings -4/4 repeat CT brain--neg -CTA head and neck negative for LVO -EEG -Check VBG 7.43/49/<31/40 -Ammonia  19 -RPR--neg -Patient remains confused with intermittent agitation -this is likely the patient's baseline representing progression of underlying cognitive impairment -It does not appear that the patient currently has capacity to make her own decisions -based upon my evaluation of patient and discussion with family>> Patient is not longer able to make rational decisions regarding her own healthcare and well being.  She does not exhibit understanding of the consequences of her decisions regarding her healthcare and well being.  She continues to have impulsive behavior and is not able to make rational decisions regarding her person, finances or personal property.  Family states that patient continues to have gradual cognitive decline which has impaired the patient's ability to manage her finances and ability to care for herself  and perform appropriate activities of daily living.   Hypokalemia -Replete -Magnesium 2.0  Syncope -pt had syncopal episode 4/4 -felt to be vasovagal -telemetry showed atrial paced with intermittent afib -appreciate cardiology consult   Frequent falls/failure to thrive -PT evaluation>>plan for SNF -Workup as discussed above   Persistent atrial fibrillation -Rate controlled -Continue amiodarone -Patient has declined anticoagulation in the past   Chronic respiratory failure with hypoxia -She is on 2 L  -Stable   Coronary artery disease -No chest pain presently -Reviewed Dr. Lubertha Basque 07/15/22 note--patient has been on Brilinta in the past>> stopped temporarily for generator exchange on her pacemaker -Reviewed Dr. Ival Bible 06/16/2022 note--Brilinta reduced to 60 mg twice daily -Continue Ranexa, imdur, and metoprolol succinate   Sinus node dysfunction -Status post PPM -Generator exchanged 07/15/2022 -Follow-up Dr. Sharrell Ku   Essential hypertension -Continue amlodipine   Chronic diastolic CHF -Clinically euvolemic -Echo from 06/30/2022 with EF of 55  to 60%, no regional wall motion abnormalities    IPMN -As discussed above in the history -Outpatient follow-up with GI -CA 19-9 was noted to be normal at 14 back in February 2024.  -She will need repeat CT with pancreatic protocol as an outpatient   Anxiety -Patient is on chronic alprazolam -PDMP reviewed--alprazolam 1 mg, #90, last refill 10/04/22   COPD -Continue as needed bronchodilators -Stable       Family Communication:  grand daughter updated 4/5   Consultants:  none   Code Status:  DNR   DVT Prophylaxis:   [place] Lovenox   DISPO: patient is medically stable for d/c.  Barrier includes awaiting interim guardianship hearing on 4/9     Subjective: Patient denies fevers, chills, headache, chest pain, dyspnea, nausea, vomiting, diarrhea, abdominal pain, dysuria,   Objective: Vitals:   11/06/22 1954 11/06/22 2347 11/07/22 0423 11/07/22 1300  BP: 134/73 (!) 164/65 (!) 145/69 108/62  Pulse: 77 64 62 65  Resp: 20 (!) 22  16  Temp: 98.5 F (36.9 C) 98.9 F (37.2 C) 98.4 F (36.9 C) 97.8 F (36.6 C)  TempSrc:  Oral Oral Oral  SpO2: 99% 92% 97%   Weight:      Height:        Intake/Output Summary (Last 24 hours) at 11/07/2022 1444 Last data filed at 11/07/2022 1044 Gross per 24 hour  Intake 379.65 ml  Output --  Net 379.65 ml   Weight change:  Exam:  General:  Pt is alert, follows commands appropriately, not  in acute distress HEENT: No icterus, No thrush, No neck mass, [place]/AT Cardiovascular: RRR, S1/S2, no rubs, no gallops Respiratory: fine bibasilar crackles. No wheeze Abdomen: Soft/+BS, non tender, non distended, no guarding Extremities: No edema, No lymphangitis, No petechiae, No rashes, no synovitis   Data Reviewed: I have personally reviewed following labs and imaging studies Basic Metabolic Panel: Recent Labs  Lab 11/01/22 0446 11/05/22 1817 11/05/22 1833 11/06/22 0407 11/07/22 0013 11/07/22 0406  NA 136 140 137 139 137 139  K 3.4* 3.4* 3.2* 3.0*  3.4* 3.6  CL 100 105 103 106 107 108  CO2 23  --  23 25 23 24   GLUCOSE 98 98 98 86 91 84  BUN 22 14 15 11 13 11   CREATININE 1.09* 0.70 0.84 0.66 0.80 0.72  CALCIUM 9.7  --  9.5 8.9 9.0 9.0  MG  --   --   --  2.0  --  2.0  PHOS  --   --   --  3.0  --   --    Liver Function Tests: Recent Labs  Lab 11/01/22 0446 11/05/22 1833 11/06/22 0407  AST 36 33 26  ALT 26 23 20   ALKPHOS 87 80 68  BILITOT 0.6 1.1 1.3*  PROT 7.9 7.6 6.3*  ALBUMIN 4.2 3.9 3.2*   Recent Labs  Lab 11/01/22 0446  LIPASE 39   Recent Labs  Lab 11/05/22 1833  AMMONIA 19   Coagulation Profile: Recent Labs  Lab 11/01/22 0446 11/05/22 1833  INR 1.0 1.1   CBC: Recent Labs  Lab 11/01/22 0446 11/05/22 1817 11/05/22 1908 11/06/22 0407  WBC 7.9  --  8.6 6.7  NEUTROABS 5.3  --  5.9  --   HGB 13.7 13.6 13.1 11.9*  HCT 41.5 40.0 40.3 36.4  MCV 92.2  --  92.9 94.1  PLT 208  --  208 191   Cardiac Enzymes: No results for input(s): "CKTOTAL", "CKMB", "CKMBINDEX", "TROPONINI" in the last 168 hours. BNP: Invalid input(s): "POCBNP" CBG: Recent Labs  Lab 11/05/22 1811  GLUCAP 111*   HbA1C: No results for input(s): "HGBA1C" in the last 72 hours. Urine analysis:    Component Value Date/Time   COLORURINE YELLOW 11/05/2022 2210   APPEARANCEUR HAZY (A) 11/05/2022 2210   LABSPEC 1.023 11/05/2022 2210   PHURINE 8.0 11/05/2022 2210   GLUCOSEU >=500 (A) 11/05/2022 2210   HGBUR NEGATIVE 11/05/2022 2210   BILIRUBINUR NEGATIVE 11/05/2022 2210   KETONESUR 5 (A) 11/05/2022 2210   PROTEINUR NEGATIVE 11/05/2022 2210   UROBILINOGEN 0.2 12/07/2009 1532   NITRITE NEGATIVE 11/05/2022 2210   LEUKOCYTESUR TRACE (A) 11/05/2022 2210   Sepsis Labs: @LABRCNTIP (procalcitonin:4,lacticidven:4) ) Recent Results (from the past 240 hour(s))  Blood Culture (routine x 2)     Status: None   Collection Time: 11/01/22  5:30 AM   Specimen: BLOOD RIGHT FOREARM  Result Value Ref Range Status   Specimen Description   Final     BLOOD RIGHT FOREARM BOTTLES DRAWN AEROBIC AND ANAEROBIC   Special Requests Blood Culture adequate volume  Final   Culture   Final    NO GROWTH 5 DAYS Performed at [facility], [address] [place], [place] [address]    Report Status 11/06/2022 FINAL  Final  Blood Culture (routine x 2)     Status: None   Collection Time: 11/01/22  5:38 AM   Specimen: BLOOD RIGHT HAND  Result Value Ref Range Status   Specimen Description   Final  BLOOD RIGHT HAND BOTTLES DRAWN AEROBIC AND ANAEROBIC   Special Requests Blood Culture adequate volume  Final   Culture   Final    NO GROWTH 5 DAYS Performed at [facility], [address], [place], [place] [address]    Report Status 11/06/2022 FINAL  Final  Blood culture (routine x 2)     Status: None (Preliminary result)   Collection Time: 11/05/22  8:23 PM   Specimen: BLOOD RIGHT FOREARM  Result Value Ref Range Status   Specimen Description BLOOD RIGHT FOREARM  Final   Special Requests   Final    BOTTLES DRAWN AEROBIC AND ANAEROBIC Blood Culture adequate volume   Culture   Final    NO GROWTH 2 DAYS Performed at [facility], [address], [place], [place] [address]    Report Status PENDING  Incomplete  Blood culture (routine x 2)     Status: None (Preliminary result)   Collection Time: 11/05/22  8:28 PM   Specimen: BLOOD RIGHT FOREARM  Result Value Ref Range Status   Specimen Description BLOOD RIGHT FOREARM  Final   Special Requests   Final    BOTTLES DRAWN AEROBIC ONLY Blood Culture adequate volume   Culture   Final    NO GROWTH 2 DAYS Performed at [facility], [address] [place], [place] [address]    Report Status PENDING  Incomplete     Scheduled Meds:  amiodarone  200 mg Oral Once per day on Mon Tue Wed Thu Fri Sat   aspirin EC  81 mg Oral Q breakfast   enoxaparin (LOVENOX) injection  40 mg Subcutaneous Q24H   isosorbide mononitrate  30 mg Oral Daily   metoprolol succinate  50 mg Oral BID   ranolazine  500 mg Oral BID    ticagrelor  60 mg Oral BID   Continuous Infusions:  Procedures/Studies: CT HEAD WO CONTRAST ( )  Result Date: 11/06/2022 CLINICAL DATA:  Neuro deficit, acute, stroke suspected. Altered mental status. EXAM: CT HEAD WITHOUT CONTRAST TECHNIQUE: Contiguous axial images were obtained from the base of the skull through the vertex without intravenous contrast. RADIATION DOSE REDUCTION: This exam was performed according to the departmental dose-optimization program which includes automated exposure control, adjustment of the mA and/or kV according to patient size and/or use of iterative reconstruction technique. COMPARISON:  Head CT 11/05/2022. FINDINGS: Brain: No acute intracranial hemorrhage. Unchanged mild chronic small-vessel disease with old lacunar infarct in the left lentiform nucleus. Gray-white differentiation is otherwise preserved. No hydrocephalus or extra-axial collection. No mass effect or midline shift. Vascular: No hyperdense vessel or unexpected calcification. Skull: No calvarial fracture or suspicious bone lesion. Skull base is unremarkable. Sinuses/Orbits: Unremarkable. Other: None. IMPRESSION: 1. No acute intracranial process. 2. Unchanged mild chronic small-vessel disease. Electronically Signed   By: Orvan Falconer M.D.   On: 11/06/2022 08:27   DG Chest Port 1 View  Result Date: 11/05/2022 CLINICAL DATA:  Weakness, coronary artery disease, COPD EXAM: PORTABLE CHEST 1 VIEW COMPARISON:  11/01/2022 FINDINGS: The lungs are symmetrically well expanded. No pneumothorax or pleural effusion. Mild progressive central pulmonary vascular congestion and trace diffuse interstitial pulmonary edema, likely cardiogenic in nature, are new since prior examination. Cardiac size is within normal limits. Tortuosity and ectasia of the thoracic aorta is noted, better assessed on CT examination of 11/01/2022. Left subclavian dual lead pacemaker is unchanged. IMPRESSION: 1. Developing, mild cardiogenic failure. 2.  Tortuosity and ectasia of the thoracic aorta. Electronically Signed   By: Helyn Numbers  M.D.   On: 11/05/2022 20:04   CT ANGIO HEAD NECK W WO CM W PERF (CODE STROKE)  Result Date: 11/05/2022 CLINICAL DATA:  Neuro deficit with acute stroke suspected. Right-sided weakness and facial droop EXAM: CT ANGIOGRAPHY HEAD AND NECK CT PERFUSION BRAIN TECHNIQUE: Multidetector CT imaging of the head and neck was performed using the standard protocol during bolus administration of intravenous contrast. Multiplanar CT image reconstructions and MIPs were obtained to evaluate the vascular anatomy. Carotid stenosis measurements (when applicable) are obtained utilizing NASCET criteria, using the distal internal carotid diameter as the denominator. Multiphase CT imaging of the brain was performed following IV bolus contrast injection. Subsequent parametric perfusion maps were calculated using RAPID software. RADIATION DOSE REDUCTION: This exam was performed according to the departmental dose-optimization program which includes automated exposure control, adjustment of the mA and/or kV according to patient size and/or use of iterative reconstruction technique. CONTRAST:  OMNIPAQUE IOHEXOL 350 MG/ML SOLN COMPARISON:  Head CT from earlier today. FINDINGS: Aortic arch: Atheromatous plaque.  Three vessel branching Right carotid system: Atheromatous plaque at the bifurcation. High-grade stenosis is suspected although there is limited quantification due to more shin artifact. Narrowing is over 50%. Left carotid system: Atheromatous plaque at the common carotid origin and at the bifurcation without flow reducing stenosis or ulceration. Vertebral arteries: Calcified plaque at the proximal right subclavian causes 70% stenosis as measured on coronal reformats. Advanced narrowing at the right vertebral origin due to atheromatous plaque. Moderate narrowing at the left vertebral origin. No dissection or beading. Skeleton: Ordinary cervical  spine degeneration Other neck: No acute finding Upper chest: Emphysema. Review of the MIP images confirms the above findings CTA HEAD FINDINGS Anterior circulation: Limited by motion. Extensive atheromatous calcification of the cavernous carotids. There may be significant narrowing at the right MCA bifurcation, although degraded by motion. No emergent large vessel occlusion Posterior circulation: Right dominant vertebral artery. Before atheromatous plaque with high-grade narrowing affecting the right more than left vertebral artery. Moderately extensive atheromatous irregularity of the basilar. Severe left P1 segment stenosis with downstream underfilling. Bilateral PCA atheromatous irregularity is extensive. Venous sinuses: Unremarkable. CT Brain Perfusion Findings: ASPECTS: 10 CBF (<30%) Volume: 0mL Perfusion (Tmax>6.0s) volume: 3mL There is notable motion artifact. IMPRESSION: 1. Limited study due to motion artifact. 2. Severe narrowing at the left P1 segment. On coronal thick MIPS there is question of luminal filling defect but given the degree of motion this may instead be atheromatous. 3. Right ICA origin stenosis that is likely flow reducing with possible downstream underfilling. 4. 70% narrowing at the right subclavian origin. 5. High-grade narrowing of the right vertebral artery at its origin and vertebrobasilar junction. Advanced narrowing of the non dominant left V4 segment as well. 6. Moderate distal basilar stenosis. 7. Severe PCA atheromatous irregularity. 8. Noncontributory CT perfusion. Electronically Signed   By: Tiburcio Pea M.D.   On: 11/05/2022 19:26   CT Cervical Spine Wo Contrast  Result Date: 11/05/2022 CLINICAL DATA:  Trauma, multiple falls EXAM: CT CERVICAL SPINE WITHOUT CONTRAST TECHNIQUE: Multidetector CT imaging of the cervical spine was performed without intravenous contrast. Multiplanar CT image reconstructions were also generated. RADIATION DOSE REDUCTION: This exam was performed  according to the departmental dose-optimization program which includes automated exposure control, adjustment of the mA and/or kV according to patient size and/or use of iterative reconstruction technique. COMPARISON:  11/01/2022 FINDINGS: There is patient motion in some of the images limiting the study. Alignment: Alignment of posterior margins of vertebral bodies is  within normal limits. Skull base and vertebrae: No recent fracture is seen. Soft tissues and spinal canal: Posterior bony spurs causing mild extrinsic pressure on the ventral margin of thecal sac from C3 to C6 levels. Disc levels: There is minimal encroachment of right neural foramen at C2-C3 level. There is mild encroachment of neural foramina from C3-C7 levels. Upper chest: Linear patchy pleural densities are seen in both apices. Other: Thyroid is enlarged with inhomogeneous attenuation. IMPRESSION: Motion limited study. No recent fracture is seen in cervical spine. Cervical spondylosis with encroachment of neural foramina at multiple levels. There is mild extrinsic pressure over the ventral margin of thecal sac caused by posterior bony spurs without significant central spinal stenosis. Thyroid is enlarged with inhomogeneous attenuation. Nonemergent thyroid sonogram may be considered. Electronically Signed   By: Ernie Avena M.D.   On: 11/05/2022 19:17   CT HEAD CODE STROKE WO CONTRAST  Result Date: 11/05/2022 CLINICAL DATA:  Code stroke. Neuro deficit with acute stroke suspected EXAM: CT HEAD WITHOUT CONTRAST TECHNIQUE: Contiguous axial images were obtained from the base of the skull through the vertex without intravenous contrast. RADIATION DOSE REDUCTION: This exam was performed according to the departmental dose-optimization program which includes automated exposure control, adjustment of the mA and/or kV according to patient size and/or use of iterative reconstruction technique. COMPARISON:  Head CT 11/01/2022 FINDINGS: Brain: No  evidence of acute infarction, hemorrhage, hydrocephalus, extra-axial collection or mass lesion/mass effect. Chronic small vessel ischemia with chronic perforator infarct at the left corona radiata. Vascular: No hyperdense vessel or unexpected calcification. Skull: Normal. Negative for fracture or focal lesion. Sinuses/Orbits: No acute finding. Other: Prelim sent in epic chat. ASPECTS [facility] Stroke Program Early CT Score) Not scored without localizing history. IMPRESSION: No acute or interval finding. Electronically Signed   By: Tiburcio Pea M.D.   On: 11/05/2022 18:32   CT CHEST ABDOMEN PELVIS W CONTRAST  Result Date: 11/01/2022 CLINICAL DATA:  Unwitnessed fall.  Blunt poly trauma. EXAM: CT CHEST, ABDOMEN, AND PELVIS WITH CONTRAST TECHNIQUE: Multidetector CT imaging of the chest, abdomen and pelvis was performed following the standard protocol during bolus administration of intravenous contrast. RADIATION DOSE REDUCTION: This exam was performed according to the departmental dose-optimization program which includes automated exposure control, adjustment of the mA and/or kV according to patient size and/or use of iterative reconstruction technique. CONTRAST:  OMNIPAQUE IOHEXOL 300 MG/ML  SOLN COMPARISON:  06/29/2022 FINDINGS: CT CHEST FINDINGS Cardiovascular: No significant vascular findings. Normal heart size. No pericardial effusion. Dual-chamber pacer leads in unremarkable position. Atheromatous calcification of the aorta and coronaries. Mediastinum/Nodes: No hematoma or pneumomediastinum Lungs/Pleura: There is no edema, consolidation, effusion, or pneumothorax. Musculoskeletal: No acute finding CT ABDOMEN PELVIS FINDINGS Hepatobiliary: No hepatic injury or perihepatic hematoma. Gallbladder is surgically absent Pancreas: Prominent main duct size (up to 3 mm) with multiple cystic densities in the head and body, measuring up to 9 mm at the uncinate process. MRCP was recommended 05/01/2022, follow-up to  depend on comorbidities. No acute finding. Spleen: Coarse calcifications.  No visible injury Adrenals/Urinary Tract: Left renal cortical scarring. Stomach/Bowel: No evidence of injury Vascular/Lymphatic: No evidence of injury. Extensive atheromatous calcification. Reproductive: Hysterectomy. Other: No ascites or pneumoperitoneum Musculoskeletal: Generalized osteopenia. Chronic L1 inferior endplate fracture. Generalized osteopenia. No evidence of acute injury. IMPRESSION: 1. No evidence of injury to the chest or abdomen. 2. Chronic findings, including cystic pancreas lesions, are stable from previous imaging and described above. Electronically Signed   By: Audry Riles.D.  On: 11/01/2022 06:22   DG Pelvis Portable  Result Date: 11/01/2022 CLINICAL DATA:  Fall EXAM: PORTABLE PELVIS 1 VIEWS COMPARISON:  10/07/2022 FINDINGS: There is no evidence of pelvic fracture or diastasis. No pelvic bone lesions are seen. Generalized osteopenia. IMPRESSION: Negative. Electronically Signed   By: Tiburcio Pea M.D.   On: 11/01/2022 05:29   CT HEAD WO CONTRAST ( )  Result Date: 11/01/2022 CLINICAL DATA:  Head trauma, minor.  Unwitnessed fall. EXAM: CT HEAD WITHOUT CONTRAST CT CERVICAL SPINE WITHOUT CONTRAST TECHNIQUE: Multidetector CT imaging of the head and cervical spine was performed following the standard protocol without intravenous contrast. Multiplanar CT image reconstructions of the cervical spine were also generated. RADIATION DOSE REDUCTION: This exam was performed according to the departmental dose-optimization program which includes automated exposure control, adjustment of the mA and/or kV according to patient size and/or use of iterative reconstruction technique. COMPARISON:  Five days ago FINDINGS: CT HEAD FINDINGS Brain: No evidence of acute infarction, hemorrhage, hydrocephalus, extra-axial collection or mass lesion/mass effect. Chronic small vessel ischemia in the cerebral white matter with chronic  perforator infarct at the left basal ganglia. Vascular: No hyperdense vessel or unexpected calcification. Skull: Normal. Negative for fracture or focal lesion. Sinuses/Orbits: No evidence of injury. CT CERVICAL SPINE FINDINGS Alignment: Normal. Skull base and vertebrae: No acute fracture. No primary bone lesion or focal pathologic process. Soft tissues and spinal canal: No prevertebral fluid or swelling. No visible canal hematoma. Disc levels:  Ordinary and generalized cervical spine degeneration Upper chest: Biapical scarring with no evidence of injury. IMPRESSION: No evidence of acute intracranial or cervical spine injury. Electronically Signed   By: Tiburcio Pea M.D.   On: 11/01/2022 05:28   CT CERVICAL SPINE WO CONTRAST  Result Date: 11/01/2022 CLINICAL DATA:  Head trauma, minor.  Unwitnessed fall. EXAM: CT HEAD WITHOUT CONTRAST CT CERVICAL SPINE WITHOUT CONTRAST TECHNIQUE: Multidetector CT imaging of the head and cervical spine was performed following the standard protocol without intravenous contrast. Multiplanar CT image reconstructions of the cervical spine were also generated. RADIATION DOSE REDUCTION: This exam was performed according to the departmental dose-optimization program which includes automated exposure control, adjustment of the mA and/or kV according to patient size and/or use of iterative reconstruction technique. COMPARISON:  Five days ago FINDINGS: CT HEAD FINDINGS Brain: No evidence of acute infarction, hemorrhage, hydrocephalus, extra-axial collection or mass lesion/mass effect. Chronic small vessel ischemia in the cerebral white matter with chronic perforator infarct at the left basal ganglia. Vascular: No hyperdense vessel or unexpected calcification. Skull: Normal. Negative for fracture or focal lesion. Sinuses/Orbits: No evidence of injury. CT CERVICAL SPINE FINDINGS Alignment: Normal. Skull base and vertebrae: No acute fracture. No primary bone lesion or focal pathologic  process. Soft tissues and spinal canal: No prevertebral fluid or swelling. No visible canal hematoma. Disc levels:  Ordinary and generalized cervical spine degeneration Upper chest: Biapical scarring with no evidence of injury. IMPRESSION: No evidence of acute intracranial or cervical spine injury. Electronically Signed   By: Tiburcio Pea M.D.   On: 11/01/2022 05:28   DG Chest Port 1 View  Result Date: 11/01/2022 CLINICAL DATA:  87 year old female with possible sepsis. EXAM: PORTABLE CHEST 1 VIEW COMPARISON:  Chest x-ray 10/27/2022. FINDINGS: Lung volumes are very low. Low opacity at the left base favored to reflect small left pleural effusion with associated passive subsegmental atelectasis in the left lower lobe. No definite consolidative airspace disease. No right pleural effusion. No pneumothorax. No evidence of pulmonary edema. Heart size  is normal. The patient is severely rotated to the left on today's exam, resulting in distortion of the mediastinal contours and reduced diagnostic sensitivity and specificity for mediastinal pathology. Atherosclerotic calcifications in the thoracic aorta. Left-sided pacemaker device in place with lead tips projecting over the expected location of the right atrium and right ventricle. Soft tissue anchor in the right humeral head incidentally noted. IMPRESSION: 1. Low lung volumes with small left pleural effusion and probable subsegmental atelectasis in the left lung base. 2. Aortic atherosclerosis. Electronically Signed   By: Trudie Reedaniel  Entrikin M.D.   On: 11/01/2022 05:26   DG Chest 2 View  Result Date: 10/27/2022 CLINICAL DATA:  Altered mental status EXAM: CHEST - 2 VIEW COMPARISON:  06/29/2022 FINDINGS: Left-sided pacing device as before. Cardiomegaly. Heterogeneous ground-glass opacities in the right upper lobe and left mid to lower lung. Aortic atherosclerosis. No pneumothorax. IMPRESSION: Heterogeneous ground-glass opacities in the right upper lobe and left mid to  lower lung, suspect for multifocal pneumonia. Cardiomegaly. Electronically Signed   By: Jasmine PangKim  Fujinaga M.D.   On: 10/27/2022 21:58   CT Head Wo Contrast  Result Date: 10/27/2022 CLINICAL DATA:  Altered mental status EXAM: CT HEAD WITHOUT CONTRAST TECHNIQUE: Contiguous axial images were obtained from the base of the skull through the vertex without intravenous contrast. RADIATION DOSE REDUCTION: This exam was performed according to the departmental dose-optimization program which includes automated exposure control, adjustment of the mA and/or kV according to patient size and/or use of iterative reconstruction technique. COMPARISON:  CT brain 12/11/2021 FINDINGS: Brain: No acute territorial infarction, hemorrhage or intracranial mass. Probable chronic lacunar infarct left basal ganglia. Patchy white matter hypodensity consistent with chronic small vessel ischemic change. Mild atrophy. Nonenlarged ventricles Vascular: No hyperdense vessels.  Carotid vascular calcification Skull: Normal. Negative for fracture or focal lesion. Sinuses/Orbits: No acute finding. Other: None IMPRESSION: 1. No CT evidence for acute intracranial abnormality. 2. Atrophy and chronic small vessel ischemic changes of the white matter. Electronically Signed   By: Jasmine PangKim  Fujinaga M.D.   On: 10/27/2022 21:32   CUP PACEART REMOTE DEVICE CHECK  Result Date: 10/23/2022 Scheduled remote reviewed. Normal device function.  Next remote 91 days. LA   Catarina Hartshornavid Handy Mcloud, DO  [facility]  If 7PM-7AM, please contact night-coverage [email or web address] Password [email or web address] 11/07/2022, 2:44 PM   LOS: 1 day
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
## 2022-11-07 NOTE — Care Management Note (Signed)
Attempted EEG again, patient refused.  I asked multiple time, trying to explain the necessity.  She refused.

## 2022-11-07 NOTE — Patient Instructions (Signed)
Visit Information  Thank you for taking time to visit with me today. Please don't hesitate to contact me if I can be of assistance to you.   Following are the goals we discussed today:   Goals Addressed             This Visit's Progress    [facility] care coordination services       Interventions Today    Flowsheet Row Most Recent Value  Chronic Disease   Chronic disease during today's visit Other  [falls recently x 4 after becoming dizzy, [facility], Returned a call to April after a message was left on 11/06/22,]  General Interventions   General Interventions Discussed/Reviewed General Interventions Discussed, Communication with, [employer]  Doctor Visits Discussed/Reviewed PCP, Specialist  [confirmed patient has been seen by pcp, neurology during 11/05/22 admission,]  Communication with Social Work  [confirmed April has spoken with [facility] SW 11/06/22 and is contact with the [facility] hospital SW. Collaborated with [facility] SW while on this 11/07/22 call with April.  Mardene Celeste assisting April with guardianship process (not IVC as misunderstood by RN CM )]  Education Interventions   Education Provided Provided Education  [facility] process, release of records, safety for the patient]  Safety Interventions   Safety Discussed/Reviewed Safety Reviewed, Fall Risk              Our next appointment is by telephone on 11/10/22 at 1:30 pm  Please call the care guide team at [phone] if you need to cancel or reschedule your appointment.   If you are experiencing a Mental Health or Behavioral Health Crisis or need someone to talk to, please call the Suicide and Crisis Lifeline: 988 call the [place] National Suicide Prevention Lifeline: 929-305-0003 or TTY: 430 535 3679 TTY (760)455-7993) to talk to a trained counselor call 1-800-273-TALK (toll free, 24 hour hotline) call the [facility]: 330-044-6526 call 911   Patient verbalizes understanding of instructions and  care plan provided today and agrees to view in MyChart. Active MyChart status and patient understanding of how to access instructions and care plan via MyChart confirmed with patient.     The patient has been provided with contact information for the care management team and has been advised to call with any health related questions or concerns.   Corben Auzenne L. Noelle Penner, RN, BSN, CCM [facility] Care Management Community Coordinator Office number [phone]

## 2022-11-07 NOTE — Progress Notes (Signed)
   Please refer to the full Cardiology consult note from 11/06/2022. She was evaluated for syncope which was not felt to be of a cardiac etiology. Orthostatics previously recommended but not able to be obtained given the patient's agitation. Can try again later today if this improves. Telemetry reviewed and she has been in an A-paced rhythm with HR in the 60's. No additional Cardiology recommendations at this time. Please reach out to the on-call provider on Amion if we can be of further assistance this admission.   [facility] will sign off.   Medication Recommendations: Continue current cardiac medications. Other recommendations (labs, testing, etc): Check orthostatics if able this admission.  Follow up as an outpatient: She has previously scheduled Cardiology follow-up with Dr. Diona Browner in 6 weeks and can keep that appointment.   Signed, Ellsworth Lennox, PA-C 11/07/2022, 8:55 AM
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
## 2022-11-08 DIAGNOSIS — I495 Sick sinus syndrome: Secondary | ICD-10-CM | POA: Diagnosis not present

## 2022-11-08 DIAGNOSIS — J9611 Chronic respiratory failure with hypoxia: Secondary | ICD-10-CM | POA: Diagnosis not present

## 2022-11-08 DIAGNOSIS — I482 Chronic atrial fibrillation, unspecified: Secondary | ICD-10-CM | POA: Diagnosis not present

## 2022-11-08 DIAGNOSIS — R531 Weakness: Secondary | ICD-10-CM | POA: Diagnosis not present

## 2022-11-08 LAB — VITAMIN B1: Vitamin B1 (Thiamine): 142 nmol/L (ref 66.5–200.0)

## 2022-11-08 NOTE — Progress Notes (Addendum)
PROGRESS NOTE  [name] YNW:[id number] DOB: February 26, 1936 DOA: 11/05/2022 PCP: [name], MD  Brief History:  87 year old female with a history of chronic respiratory failure on 2 L, atrial fibrillation, IPMN, tachybradycardia syndrome status post PPM, coronary artery disease, hyperlipidemia presenting with confusion and right-sided weakness.  The patient was not able to provide any significant history secondary to her confusion.  History is obtained from review the medical record and speaking with the patient's family.  Notably, the patient was last noted to be normal when she spoke on the phone with her granddaughter 8:30 PM on 11/04/2022.  The patient's granddaughter checked on her at 54 AM on 11/05/2022 and found the patient to be sitting in the chair with her Bible mumbling.  The patient received a call from a doctor's office and while getting up to take the call, the patient had a mechanical fall.  Apparently the patient has fallen 3-4 times in the last 24 hours.  EMS was activated, but the patient refused to come to the hospital.  Later on the day, the patient had an additional fall, and there was concern with the patient having some right-sided weakness and trouble speaking.  EMS was called and code stroke was activated. Notably, the patient was recently admitted to the hospital from 10/27/2022 to 10/30/2022.  The patient was treated for pneumonia with ceftriaxone and doxycycline.  Apparently the patient was having paranoid delusions at that time.  She was evaluated by behavioral health and was not found to have any indication for inpatient psychiatric admission.  She was recommended to go to SNF after PT evaluation, but patient refused and family subsequently took the patient home. In speaking with the patient's granddaughter, it appears that [name] is the patient's main advocate and caretaker. [name] states that the patient has not been taking her medications properly.  The  patient frequently forgets to take her medications.  In the last 6 months, the patient has had a cognitive decline with increasing paranoid behavior.  In addition, the patient the patient has also had some impulsive behavior.  Patient has a history of mixed type IPMN.  She was initially seen on 12/03/2021 by [name], and she was found  to have a growing pancreatic cystic lesion with associated abdominal pain.  Due to these she was referred for endoscopic ultrasound.  Underwent endoscopic ultrasound with [name] on 02/24/2022 which found Anechoic lesions suggestive of three cysts were identified in the pancreatic head.  Cytology showed an acellular specimen.  The case was ultimately discussed at multidisciplinary tumor board.   They considered that she was presenting changes concerning for a mixed branch duct/main duct IPMN.  It was considered that given her medical comorbidities she would not be the best surgical candidate but she will continue monitoring. They  decided that she should have a repeat CT pancreas protocol in 6 months.  Depending on the findings on imaging, may need to discuss again the benefits versus risk of undergoing surgical resection.  patient underwent a CT of the abdomen with and without IV contrast on 05/01/2022 which showed decrease size of the cystic lesions of the pancreatic head and uncinate process measuring up to 15 mm.  She was advised to have a repeat CT pancreas protocol in 6 months.  She was last seen in the GI office on 09/25/2022 at which time there were plans for repeat imaging later this year.  Assessment/Plan: Acute metabolic encephalopathy -Multifactorial including electrolyte disturbance, dehydration, and progression of underlying cognitive impairment -Serum [id number] -TSH 1.218 -UA negative for pyuria -4/3 CT brain negative for acute findings -4/4 repeat CT brain--neg -CTA head and neck negative for LVO -EEG--deferred due to pt refusal and agitation -Check  VBG 7.43/49/<31/40 -Ammonia 19 -RPR--neg -Patient remains confused with intermittent agitation -this is likely the patient's baseline representing progression of underlying cognitive impairment -It does not appear that the patient currently has capacity to make her own decisions.  She has poor insight regarding her medical condition -based upon my evaluation of patient and discussion with family>> Patient is not longer able to make rational decisions regarding her own healthcare and well being.  She does not exhibit understanding of the consequences of her decisions regarding her healthcare and well being.  She continues to have impulsive behavior and is not able to make rational decisions regarding her person, finances or personal property.  Family states that patient continues to have gradual cognitive decline which has impaired the patient's ability to manage her finances and ability to care for herself  and perform appropriate activities of daily living.   Hypokalemia -Repleted -Magnesium 2.0   Syncope -pt had syncopal episode 4/4 -felt to be vasovagal -telemetry showed atrial paced with intermittent afib -appreciate cardiology consult   Frequent falls/failure to thrive -PT evaluation>>plan for SNF -Workup as discussed above   Persistent atrial fibrillation -Rate controlled -Continue amiodarone -Patient has declined anticoagulation in the past   Chronic respiratory failure with hypoxia -She is on 2 L  -Stable   Coronary artery disease -No chest pain presently -Reviewed Dr. Lubertha Basqueaylor's 07/15/22 note--patient has been on Brilinta in the past>> stopped temporarily for generator exchange on her pacemaker -Reviewed Dr. Ival BibleMcDowell's 06/16/2022 note--Brilinta reduced to 60 mg twice daily -Continue Ranexa, imdur, and metoprolol succinate   Sinus node dysfunction -Status post PPM -Generator exchanged 07/15/2022 -Follow-up Dr. Sharrell KuGreg Taylor   Essential hypertension -Continue  amlodipine   Chronic diastolic CHF -Clinically euvolemic -Echo from 06/30/2022 with EF of 55 to 60%, no regional wall motion abnormalities    IPMN -As discussed above in the history -Outpatient follow-up with GI -CA 19-9 was noted to be normal at 14 back in February 2024.  -She will need repeat CT with pancreatic protocol as an outpatient   Anxiety -Patient is on chronic alprazolam -PDMP reviewed--alprazolam 1 mg, #90, last refill 10/04/22   COPD -Continue as needed bronchodilators -Stable       Family Communication:  grand daughter updated 4/5   Consultants:  none   Code Status:  DNR   DVT Prophylaxis:   [place] Lovenox   DISPO: patient is medically stable for d/c.  Barrier includes awaiting interim guardianship hearing on 4/9    Subjective: Patient denies fevers, chills, headache, chest pain, dyspnea, nausea, vomiting, diarrhea, abdominal pain,   Objective: Vitals:   11/07/22 2005 11/07/22 2111 11/08/22 0410 11/08/22 1416  BP: 126/85  (!) 156/76 125/83  Pulse: (!) 58 77 68 62  Resp: 16  16   Temp: 97.7 F (36.5 C)  97.8 F (36.6 C) 97.6 F (36.4 C)  TempSrc: Axillary  Axillary Oral  SpO2: 97%  96% 100%  Weight:      Height:        Intake/Output Summary (Last 24 hours) at 11/08/2022 1701 Last data filed at 11/08/2022 0832 Gross per 24 hour  Intake 240 ml  Output --  Net 240 ml   Weight change:  Exam:  General:  Pt is alert, follows commands appropriately, not in acute distress HEENT: No icterus, No thrush, No neck mass, [place]/AT Cardiovascular: IRRR, S1/S2, no rubs, no gallops Respiratory: diminished BS.  Bibasilar rales.  No wheeze Abdomen: Soft/+BS, non tender, non distended, no guarding Extremities: No edema, No lymphangitis, No petechiae, No rashes, no synovitis Neuro:  CN II-XII intact, strength 4-/5 in RUE, RLE, strength 4-/5 LUE, LLE; sensation intact bilateral; no dysmetria; babinski equivocal    Data Reviewed: I have personally reviewed following  labs and imaging studies Basic Metabolic Panel: Recent Labs  Lab 11/05/22 1817 11/05/22 1833 11/06/22 0407 11/07/22 0013 11/07/22 0406  NA 140 137 139 137 139  K 3.4* 3.2* 3.0* 3.4* 3.6  CL 105 103 106 107 108  CO2  --  23 25 23 24   GLUCOSE 98 98 86 91 84  BUN 14 15 11 13 11   CREATININE 0.70 0.84 0.66 0.80 0.72  CALCIUM  --  9.5 8.9 9.0 9.0  MG  --   --  2.0  --  2.0  PHOS  --   --  3.0  --   --    Liver Function Tests: Recent Labs  Lab 11/05/22 1833 11/06/22 0407  AST 33 26  ALT 23 20  ALKPHOS 80 68  BILITOT 1.1 1.3*  PROT 7.6 6.3*  ALBUMIN 3.9 3.2*   No results for input(s): "LIPASE", "AMYLASE" in the last 168 hours. Recent Labs  Lab 11/05/22 1833  AMMONIA 19   Coagulation Profile: Recent Labs  Lab 11/05/22 1833  INR 1.1   CBC: Recent Labs  Lab 11/05/22 1817 11/05/22 1908 11/06/22 0407  WBC  --  8.6 6.7  NEUTROABS  --  5.9  --   HGB 13.6 13.1 11.9*  HCT 40.0 40.3 36.4  MCV  --  92.9 94.1  PLT  --  208 191   Cardiac Enzymes: No results for input(s): "CKTOTAL", "CKMB", "CKMBINDEX", "TROPONINI" in the last 168 hours. BNP: Invalid input(s): "POCBNP" CBG: Recent Labs  Lab 11/05/22 1811  GLUCAP 111*   HbA1C: No results for input(s): "HGBA1C" in the last 72 hours. Urine analysis:    Component Value Date/Time   COLORURINE YELLOW 11/05/2022 2210   APPEARANCEUR HAZY (A) 11/05/2022 2210   LABSPEC 1.023 11/05/2022 2210   PHURINE 8.0 11/05/2022 2210   GLUCOSEU >=500 (A) 11/05/2022 2210   HGBUR NEGATIVE 11/05/2022 2210   BILIRUBINUR NEGATIVE 11/05/2022 2210   KETONESUR 5 (A) 11/05/2022 2210   PROTEINUR NEGATIVE 11/05/2022 2210   UROBILINOGEN 0.2 12/07/2009 1532   NITRITE NEGATIVE 11/05/2022 2210   LEUKOCYTESUR TRACE (A) 11/05/2022 2210   Sepsis Labs: @LABRCNTIP (procalcitonin:4,lacticidven:4) ) Recent Results (from the past 240 hour(s))  Blood Culture (routine x 2)     Status: None   Collection Time: 11/01/22  5:30 AM   Specimen: BLOOD  RIGHT FOREARM  Result Value Ref Range Status   Specimen Description   Final    BLOOD RIGHT FOREARM BOTTLES DRAWN AEROBIC AND ANAEROBIC   Special Requests Blood Culture adequate volume  Final   Culture   Final    NO GROWTH 5 DAYS Performed at [facility], [address] [place], [place] [address]    Report Status 11/06/2022 FINAL  Final  Blood Culture (routine x 2)     Status: None   Collection Time: 11/01/22  5:38 AM   Specimen: BLOOD RIGHT HAND  Result Value Ref Range Status   Specimen Description   Final    BLOOD  RIGHT HAND BOTTLES DRAWN AEROBIC AND ANAEROBIC   Special Requests Blood Culture adequate volume  Final   Culture   Final    NO GROWTH 5 DAYS Performed at [facility], [address], [place], [place] [address]    Report Status 11/06/2022 FINAL  Final  Blood culture (routine x 2)     Status: None (Preliminary result)   Collection Time: 11/05/22  8:23 PM   Specimen: BLOOD RIGHT FOREARM  Result Value Ref Range Status   Specimen Description BLOOD RIGHT FOREARM  Final   Special Requests   Final    BOTTLES DRAWN AEROBIC AND ANAEROBIC Blood Culture adequate volume   Culture   Final    NO GROWTH 3 DAYS Performed at [facility], [address], [place], [place] [address]    Report Status PENDING  Incomplete  Blood culture (routine x 2)     Status: None (Preliminary result)   Collection Time: 11/05/22  8:28 PM   Specimen: BLOOD RIGHT FOREARM  Result Value Ref Range Status   Specimen Description BLOOD RIGHT FOREARM  Final   Special Requests   Final    BOTTLES DRAWN AEROBIC ONLY Blood Culture adequate volume   Culture   Final    NO GROWTH 3 DAYS Performed at [facility], [address] [place], [place] [address]    Report Status PENDING  Incomplete     Scheduled Meds:  amiodarone  200 mg Oral Once per day on Mon Tue Wed Thu Fri Sat   aspirin EC  81 mg Oral Q breakfast   enoxaparin (LOVENOX) injection  40 mg Subcutaneous Q24H   folic acid  1 mg Oral Daily    isosorbide mononitrate  30 mg Oral Daily   metoprolol succinate  50 mg Oral BID   ranolazine  500 mg Oral BID   ticagrelor  60 mg Oral BID   Continuous Infusions:  Procedures/Studies: CT HEAD WO CONTRAST ( )  Result Date: 11/06/2022 CLINICAL DATA:  Neuro deficit, acute, stroke suspected. Altered mental status. EXAM: CT HEAD WITHOUT CONTRAST TECHNIQUE: Contiguous axial images were obtained from the base of the skull through the vertex without intravenous contrast. RADIATION DOSE REDUCTION: This exam was performed according to the departmental dose-optimization program which includes automated exposure control, adjustment of the mA and/or kV according to patient size and/or use of iterative reconstruction technique. COMPARISON:  Head CT 11/05/2022. FINDINGS: Brain: No acute intracranial hemorrhage. Unchanged mild chronic small-vessel disease with old lacunar infarct in the left lentiform nucleus. Gray-white differentiation is otherwise preserved. No hydrocephalus or extra-axial collection. No mass effect or midline shift. Vascular: No hyperdense vessel or unexpected calcification. Skull: No calvarial fracture or suspicious bone lesion. Skull base is unremarkable. Sinuses/Orbits: Unremarkable. Other: None. IMPRESSION: 1. No acute intracranial process. 2. Unchanged mild chronic small-vessel disease. Electronically Signed   By: Orvan Falconer M.D.   On: 11/06/2022 08:27   DG Chest Port 1 View  Result Date: 11/05/2022 CLINICAL DATA:  Weakness, coronary artery disease, COPD EXAM: PORTABLE CHEST 1 VIEW COMPARISON:  11/01/2022 FINDINGS: The lungs are symmetrically well expanded. No pneumothorax or pleural effusion. Mild progressive central pulmonary vascular congestion and trace diffuse interstitial pulmonary edema, likely cardiogenic in nature, are new since prior examination. Cardiac size is within normal limits. Tortuosity and ectasia of the thoracic aorta is noted, better assessed on CT examination of  11/01/2022. Left subclavian dual lead pacemaker is unchanged. IMPRESSION: 1. Developing, mild cardiogenic failure. 2. Tortuosity and ectasia of the thoracic aorta.  Electronically Signed   By: Helyn Numbers M.D.   On: 11/05/2022 20:04   CT ANGIO HEAD NECK W WO CM W PERF (CODE STROKE)  Result Date: 11/05/2022 CLINICAL DATA:  Neuro deficit with acute stroke suspected. Right-sided weakness and facial droop EXAM: CT ANGIOGRAPHY HEAD AND NECK CT PERFUSION BRAIN TECHNIQUE: Multidetector CT imaging of the head and neck was performed using the standard protocol during bolus administration of intravenous contrast. Multiplanar CT image reconstructions and MIPs were obtained to evaluate the vascular anatomy. Carotid stenosis measurements (when applicable) are obtained utilizing NASCET criteria, using the distal internal carotid diameter as the denominator. Multiphase CT imaging of the brain was performed following IV bolus contrast injection. Subsequent parametric perfusion maps were calculated using RAPID software. RADIATION DOSE REDUCTION: This exam was performed according to the departmental dose-optimization program which includes automated exposure control, adjustment of the mA and/or kV according to patient size and/or use of iterative reconstruction technique. CONTRAST:  OMNIPAQUE IOHEXOL 350 MG/ML SOLN COMPARISON:  Head CT from earlier today. FINDINGS: Aortic arch: Atheromatous plaque.  Three vessel branching Right carotid system: Atheromatous plaque at the bifurcation. High-grade stenosis is suspected although there is limited quantification due to more shin artifact. Narrowing is over 50%. Left carotid system: Atheromatous plaque at the common carotid origin and at the bifurcation without flow reducing stenosis or ulceration. Vertebral arteries: Calcified plaque at the proximal right subclavian causes 70% stenosis as measured on coronal reformats. Advanced narrowing at the right vertebral origin due to  atheromatous plaque. Moderate narrowing at the left vertebral origin. No dissection or beading. Skeleton: Ordinary cervical spine degeneration Other neck: No acute finding Upper chest: Emphysema. Review of the MIP images confirms the above findings CTA HEAD FINDINGS Anterior circulation: Limited by motion. Extensive atheromatous calcification of the cavernous carotids. There may be significant narrowing at the right MCA bifurcation, although degraded by motion. No emergent large vessel occlusion Posterior circulation: Right dominant vertebral artery. Before atheromatous plaque with high-grade narrowing affecting the right more than left vertebral artery. Moderately extensive atheromatous irregularity of the basilar. Severe left P1 segment stenosis with downstream underfilling. Bilateral PCA atheromatous irregularity is extensive. Venous sinuses: Unremarkable. CT Brain Perfusion Findings: ASPECTS: 10 CBF (<30%) Volume: 0mL Perfusion (Tmax>6.0s) volume: 3mL There is notable motion artifact. IMPRESSION: 1. Limited study due to motion artifact. 2. Severe narrowing at the left P1 segment. On coronal thick MIPS there is question of luminal filling defect but given the degree of motion this may instead be atheromatous. 3. Right ICA origin stenosis that is likely flow reducing with possible downstream underfilling. 4. 70% narrowing at the right subclavian origin. 5. High-grade narrowing of the right vertebral artery at its origin and vertebrobasilar junction. Advanced narrowing of the non dominant left V4 segment as well. 6. Moderate distal basilar stenosis. 7. Severe PCA atheromatous irregularity. 8. Noncontributory CT perfusion. Electronically Signed   By: Tiburcio Pea M.D.   On: 11/05/2022 19:26   CT Cervical Spine Wo Contrast  Result Date: 11/05/2022 CLINICAL DATA:  Trauma, multiple falls EXAM: CT CERVICAL SPINE WITHOUT CONTRAST TECHNIQUE: Multidetector CT imaging of the cervical spine was performed without  intravenous contrast. Multiplanar CT image reconstructions were also generated. RADIATION DOSE REDUCTION: This exam was performed according to the departmental dose-optimization program which includes automated exposure control, adjustment of the mA and/or kV according to patient size and/or use of iterative reconstruction technique. COMPARISON:  11/01/2022 FINDINGS: There is patient motion in some of the images limiting the study. Alignment:  Alignment of posterior margins of vertebral bodies is within normal limits. Skull base and vertebrae: No recent fracture is seen. Soft tissues and spinal canal: Posterior bony spurs causing mild extrinsic pressure on the ventral margin of thecal sac from C3 to C6 levels. Disc levels: There is minimal encroachment of right neural foramen at C2-C3 level. There is mild encroachment of neural foramina from C3-C7 levels. Upper chest: Linear patchy pleural densities are seen in both apices. Other: Thyroid is enlarged with inhomogeneous attenuation. IMPRESSION: Motion limited study. No recent fracture is seen in cervical spine. Cervical spondylosis with encroachment of neural foramina at multiple levels. There is mild extrinsic pressure over the ventral margin of thecal sac caused by posterior bony spurs without significant central spinal stenosis. Thyroid is enlarged with inhomogeneous attenuation. Nonemergent thyroid sonogram may be considered. Electronically Signed   By: Ernie Avena M.D.   On: 11/05/2022 19:17   CT HEAD CODE STROKE WO CONTRAST  Result Date: 11/05/2022 CLINICAL DATA:  Code stroke. Neuro deficit with acute stroke suspected EXAM: CT HEAD WITHOUT CONTRAST TECHNIQUE: Contiguous axial images were obtained from the base of the skull through the vertex without intravenous contrast. RADIATION DOSE REDUCTION: This exam was performed according to the departmental dose-optimization program which includes automated exposure control, adjustment of the mA and/or kV  according to patient size and/or use of iterative reconstruction technique. COMPARISON:  Head CT 11/01/2022 FINDINGS: Brain: No evidence of acute infarction, hemorrhage, hydrocephalus, extra-axial collection or mass lesion/mass effect. Chronic small vessel ischemia with chronic perforator infarct at the left corona radiata. Vascular: No hyperdense vessel or unexpected calcification. Skull: Normal. Negative for fracture or focal lesion. Sinuses/Orbits: No acute finding. Other: Prelim sent in epic chat. ASPECTS [facility] Stroke Program Early CT Score) Not scored without localizing history. IMPRESSION: No acute or interval finding. Electronically Signed   By: Tiburcio Pea M.D.   On: 11/05/2022 18:32   CT CHEST ABDOMEN PELVIS W CONTRAST  Result Date: 11/01/2022 CLINICAL DATA:  Unwitnessed fall.  Blunt poly trauma. EXAM: CT CHEST, ABDOMEN, AND PELVIS WITH CONTRAST TECHNIQUE: Multidetector CT imaging of the chest, abdomen and pelvis was performed following the standard protocol during bolus administration of intravenous contrast. RADIATION DOSE REDUCTION: This exam was performed according to the departmental dose-optimization program which includes automated exposure control, adjustment of the mA and/or kV according to patient size and/or use of iterative reconstruction technique. CONTRAST:  OMNIPAQUE IOHEXOL 300 MG/ML  SOLN COMPARISON:  06/29/2022 FINDINGS: CT CHEST FINDINGS Cardiovascular: No significant vascular findings. Normal heart size. No pericardial effusion. Dual-chamber pacer leads in unremarkable position. Atheromatous calcification of the aorta and coronaries. Mediastinum/Nodes: No hematoma or pneumomediastinum Lungs/Pleura: There is no edema, consolidation, effusion, or pneumothorax. Musculoskeletal: No acute finding CT ABDOMEN PELVIS FINDINGS Hepatobiliary: No hepatic injury or perihepatic hematoma. Gallbladder is surgically absent Pancreas: Prominent main duct size (up to 3 mm) with multiple  cystic densities in the head and body, measuring up to 9 mm at the uncinate process. MRCP was recommended 05/01/2022, follow-up to depend on comorbidities. No acute finding. Spleen: Coarse calcifications.  No visible injury Adrenals/Urinary Tract: Left renal cortical scarring. Stomach/Bowel: No evidence of injury Vascular/Lymphatic: No evidence of injury. Extensive atheromatous calcification. Reproductive: Hysterectomy. Other: No ascites or pneumoperitoneum Musculoskeletal: Generalized osteopenia. Chronic L1 inferior endplate fracture. Generalized osteopenia. No evidence of acute injury. IMPRESSION: 1. No evidence of injury to the chest or abdomen. 2. Chronic findings, including cystic pancreas lesions, are stable from previous imaging and described above. Electronically Signed  By: Tiburcio Pea M.D.   On: 11/01/2022 06:22   DG Pelvis Portable  Result Date: 11/01/2022 CLINICAL DATA:  Fall EXAM: PORTABLE PELVIS 1 VIEWS COMPARISON:  10/07/2022 FINDINGS: There is no evidence of pelvic fracture or diastasis. No pelvic bone lesions are seen. Generalized osteopenia. IMPRESSION: Negative. Electronically Signed   By: Tiburcio Pea M.D.   On: 11/01/2022 05:29   CT HEAD WO CONTRAST ( )  Result Date: 11/01/2022 CLINICAL DATA:  Head trauma, minor.  Unwitnessed fall. EXAM: CT HEAD WITHOUT CONTRAST CT CERVICAL SPINE WITHOUT CONTRAST TECHNIQUE: Multidetector CT imaging of the head and cervical spine was performed following the standard protocol without intravenous contrast. Multiplanar CT image reconstructions of the cervical spine were also generated. RADIATION DOSE REDUCTION: This exam was performed according to the departmental dose-optimization program which includes automated exposure control, adjustment of the mA and/or kV according to patient size and/or use of iterative reconstruction technique. COMPARISON:  Five days ago FINDINGS: CT HEAD FINDINGS Brain: No evidence of acute infarction, hemorrhage,  hydrocephalus, extra-axial collection or mass lesion/mass effect. Chronic small vessel ischemia in the cerebral white matter with chronic perforator infarct at the left basal ganglia. Vascular: No hyperdense vessel or unexpected calcification. Skull: Normal. Negative for fracture or focal lesion. Sinuses/Orbits: No evidence of injury. CT CERVICAL SPINE FINDINGS Alignment: Normal. Skull base and vertebrae: No acute fracture. No primary bone lesion or focal pathologic process. Soft tissues and spinal canal: No prevertebral fluid or swelling. No visible canal hematoma. Disc levels:  Ordinary and generalized cervical spine degeneration Upper chest: Biapical scarring with no evidence of injury. IMPRESSION: No evidence of acute intracranial or cervical spine injury. Electronically Signed   By: Tiburcio Pea M.D.   On: 11/01/2022 05:28   CT CERVICAL SPINE WO CONTRAST  Result Date: 11/01/2022 CLINICAL DATA:  Head trauma, minor.  Unwitnessed fall. EXAM: CT HEAD WITHOUT CONTRAST CT CERVICAL SPINE WITHOUT CONTRAST TECHNIQUE: Multidetector CT imaging of the head and cervical spine was performed following the standard protocol without intravenous contrast. Multiplanar CT image reconstructions of the cervical spine were also generated. RADIATION DOSE REDUCTION: This exam was performed according to the departmental dose-optimization program which includes automated exposure control, adjustment of the mA and/or kV according to patient size and/or use of iterative reconstruction technique. COMPARISON:  Five days ago FINDINGS: CT HEAD FINDINGS Brain: No evidence of acute infarction, hemorrhage, hydrocephalus, extra-axial collection or mass lesion/mass effect. Chronic small vessel ischemia in the cerebral white matter with chronic perforator infarct at the left basal ganglia. Vascular: No hyperdense vessel or unexpected calcification. Skull: Normal. Negative for fracture or focal lesion. Sinuses/Orbits: No evidence of injury. CT  CERVICAL SPINE FINDINGS Alignment: Normal. Skull base and vertebrae: No acute fracture. No primary bone lesion or focal pathologic process. Soft tissues and spinal canal: No prevertebral fluid or swelling. No visible canal hematoma. Disc levels:  Ordinary and generalized cervical spine degeneration Upper chest: Biapical scarring with no evidence of injury. IMPRESSION: No evidence of acute intracranial or cervical spine injury. Electronically Signed   By: Tiburcio Pea M.D.   On: 11/01/2022 05:28   DG Chest Port 1 View  Result Date: 11/01/2022 CLINICAL DATA:  87 year old female with possible sepsis. EXAM: PORTABLE CHEST 1 VIEW COMPARISON:  Chest x-ray 10/27/2022. FINDINGS: Lung volumes are very low. Low opacity at the left base favored to reflect small left pleural effusion with associated passive subsegmental atelectasis in the left lower lobe. No definite consolidative airspace disease. No right pleural effusion. No pneumothorax.  No evidence of pulmonary edema. Heart size is normal. The patient is severely rotated to the left on today's exam, resulting in distortion of the mediastinal contours and reduced diagnostic sensitivity and specificity for mediastinal pathology. Atherosclerotic calcifications in the thoracic aorta. Left-sided pacemaker device in place with lead tips projecting over the expected location of the right atrium and right ventricle. Soft tissue anchor in the right humeral head incidentally noted. IMPRESSION: 1. Low lung volumes with small left pleural effusion and probable subsegmental atelectasis in the left lung base. 2. Aortic atherosclerosis. Electronically Signed   By: Trudie Reed M.D.   On: 11/01/2022 05:26   DG Chest 2 View  Result Date: 10/27/2022 CLINICAL DATA:  Altered mental status EXAM: CHEST - 2 VIEW COMPARISON:  06/29/2022 FINDINGS: Left-sided pacing device as before. Cardiomegaly. Heterogeneous ground-glass opacities in the right upper lobe and left mid to lower  lung. Aortic atherosclerosis. No pneumothorax. IMPRESSION: Heterogeneous ground-glass opacities in the right upper lobe and left mid to lower lung, suspect for multifocal pneumonia. Cardiomegaly. Electronically Signed   By: Jasmine Pang M.D.   On: 10/27/2022 21:58   CT Head Wo Contrast  Result Date: 10/27/2022 CLINICAL DATA:  Altered mental status EXAM: CT HEAD WITHOUT CONTRAST TECHNIQUE: Contiguous axial images were obtained from the base of the skull through the vertex without intravenous contrast. RADIATION DOSE REDUCTION: This exam was performed according to the departmental dose-optimization program which includes automated exposure control, adjustment of the mA and/or kV according to patient size and/or use of iterative reconstruction technique. COMPARISON:  CT brain 12/11/2021 FINDINGS: Brain: No acute territorial infarction, hemorrhage or intracranial mass. Probable chronic lacunar infarct left basal ganglia. Patchy white matter hypodensity consistent with chronic small vessel ischemic change. Mild atrophy. Nonenlarged ventricles Vascular: No hyperdense vessels.  Carotid vascular calcification Skull: Normal. Negative for fracture or focal lesion. Sinuses/Orbits: No acute finding. Other: None IMPRESSION: 1. No CT evidence for acute intracranial abnormality. 2. Atrophy and chronic small vessel ischemic changes of the white matter. Electronically Signed   By: Jasmine Pang M.D.   On: 10/27/2022 21:32   CUP PACEART REMOTE DEVICE CHECK  Result Date: 10/23/2022 Scheduled remote reviewed. Normal device function.  Next remote 91 days. [place]   Catarina Hartshorn, DO  [facility]  If 7PM-7AM, please contact night-coverage [email or web address] Password [email or web address] 11/08/2022, 5:01 PM   LOS: 2 days

## 2022-11-08 NOTE — Progress Notes (Signed)
Patient pulled out IV. She stated, " The doctor came in my room this morning and told me I was going home, so I pulled it out." Site is bleeding-small amount of blood. Pressure dressing applied.

## 2022-11-08 NOTE — Progress Notes (Signed)
Notified by RN Halford Chessman -tried to call grand daughter to assist with PIV placement -granddaughter not able to come here presently -PIV deferred till grand daughter able to come here  DTat

## 2022-11-08 NOTE — Progress Notes (Signed)
Telemetry order D/C per doctor. Telemetry removed and telemetry nurses notified.

## 2022-11-09 DIAGNOSIS — I495 Sick sinus syndrome: Secondary | ICD-10-CM | POA: Diagnosis not present

## 2022-11-09 DIAGNOSIS — G9341 Metabolic encephalopathy: Secondary | ICD-10-CM | POA: Diagnosis not present

## 2022-11-09 DIAGNOSIS — I5032 Chronic diastolic (congestive) heart failure: Secondary | ICD-10-CM | POA: Diagnosis not present

## 2022-11-09 DIAGNOSIS — J9611 Chronic respiratory failure with hypoxia: Secondary | ICD-10-CM | POA: Diagnosis not present

## 2022-11-09 LAB — BASIC METABOLIC PANEL
Anion gap: 7 (ref 5–15)
BUN: 12 mg/dL (ref 8–23)
CO2: 25 mmol/L (ref 22–32)
Calcium: 9.2 mg/dL (ref 8.9–10.3)
Chloride: 106 mmol/L (ref 98–111)
Creatinine, Ser: 0.81 mg/dL (ref 0.44–1.00)
GFR, Estimated: >60 mL/min (ref >60)
Glucose, Bld: 92 mg/dL (ref 70–99)
Potassium: 3.3 mmol/L — ABNORMAL LOW (ref 3.5–5.1)
Sodium: 138 mmol/L (ref 135–145)

## 2022-11-09 LAB — CBC
HCT: 39 % (ref 36.0–46.0)
Hemoglobin: 12.4 g/dL (ref 12.0–15.0)
MCH: 30.2 pg (ref 26.0–34.0)
MCHC: 31.8 g/dL (ref 30.0–36.0)
MCV: 95.1 fL (ref 80.0–100.0)
Platelets: 195 10*3/uL (ref 150–400)
RBC: 4.1 MIL/uL (ref 3.87–5.11)
RDW: 14.6 % (ref 11.5–15.5)
WBC: 6.4 10*3/uL (ref 4.0–10.5)
nRBC: 0 % (ref 0.0–0.2)

## 2022-11-09 LAB — MAGNESIUM: Magnesium: 1.9 mg/dL (ref 1.7–2.4)

## 2022-11-09 LAB — PHOSPHORUS: Phosphorus: 3.7 mg/dL (ref 2.5–4.6)

## 2022-11-09 MED ORDER — POTASSIUM CHLORIDE CRYS ER 20 MEQ PO TBCR
40.0000 meq | EXTENDED_RELEASE_TABLET | Freq: Once | ORAL | Status: AC
  Administered 2022-11-09: 40 meq via ORAL
  Filled 2022-11-09: qty 2

## 2022-11-09 NOTE — Progress Notes (Signed)
PROGRESS NOTE  [name] YNW:[id number] DOB: February 26, 1936 DOA: 11/05/2022 PCP: [name], MD  Brief History:  87 year old female with a history of chronic respiratory failure on 2 L, atrial fibrillation, IPMN, tachybradycardia syndrome status post PPM, coronary artery disease, hyperlipidemia presenting with confusion and right-sided weakness.  The patient was not able to provide any significant history secondary to her confusion.  History is obtained from review the medical record and speaking with the patient's family.  Notably, the patient was last noted to be normal when she spoke on the phone with her granddaughter 8:30 PM on 11/04/2022.  The patient's granddaughter checked on her at 54 AM on 11/05/2022 and found the patient to be sitting in the chair with her [name] mumbling.  The patient received a call from a doctor's office and while getting up to take the call, the patient had a mechanical fall.  Apparently the patient has fallen 3-4 times in the last 24 hours.  EMS was activated, but the patient refused to come to the hospital.  Later on the day, the patient had an additional fall, and there was concern with the patient having some right-sided weakness and trouble speaking.  EMS was called and code stroke was activated. Notably, the patient was recently admitted to the hospital from 10/27/2022 to 10/30/2022.  The patient was treated for pneumonia with ceftriaxone and doxycycline.  Apparently the patient was having paranoid delusions at that time.  She was evaluated by behavioral health and was not found to have any indication for inpatient psychiatric admission.  She was recommended to go to SNF after PT evaluation, but patient refused and family subsequently took the patient home. In speaking with the patient's granddaughter, it appears that [name] is the patient's main advocate and caretaker. [name] states that the patient has not been taking her medications properly.  The  patient frequently forgets to take her medications.  In the last 6 months, the patient has had a cognitive decline with increasing paranoid behavior.  In addition, the patient the patient has also had some impulsive behavior.  Patient has a history of mixed type IPMN.  She was initially seen on 12/03/2021 by [name], and she was found  to have a growing pancreatic cystic lesion with associated abdominal pain.  Due to these she was referred for endoscopic ultrasound.  Underwent endoscopic ultrasound with [name] on 02/24/2022 which found Anechoic lesions suggestive of three cysts were identified in the pancreatic head.  Cytology showed an acellular specimen.  The case was ultimately discussed at multidisciplinary tumor board.   They considered that she was presenting changes concerning for a mixed branch duct/main duct IPMN.  It was considered that given her medical comorbidities she would not be the best surgical candidate but she will continue monitoring. They  decided that she should have a repeat CT pancreas protocol in 6 months.  Depending on the findings on imaging, may need to discuss again the benefits versus risk of undergoing surgical resection.  patient underwent a CT of the abdomen with and without IV contrast on 05/01/2022 which showed decrease size of the cystic lesions of the pancreatic head and uncinate process measuring up to 15 mm.  She was advised to have a repeat CT pancreas protocol in 6 months.  She was last seen in the GI office on 09/25/2022 at which time there were plans for repeat imaging later this year.  Assessment/Plan: Acute metabolic encephalopathy -Multifactorial including electrolyte disturbance, dehydration, and progression of underlying cognitive impairment -Serum [id number] -TSH 1.218 -UA negative for pyuria -4/3 CT brain negative for acute findings -4/4 repeat CT brain--neg -CTA head and neck negative for LVO -EEG--deferred due to pt refusal and agitation -Check  VBG 7.43/49/<31/40 -Ammonia 19 -RPR--neg -Patient remains confused with intermittent agitation -this is likely the patient's baseline representing progression of underlying cognitive impairment -It does not appear that the patient currently has capacity to make her own decisions.  She has poor insight regarding her medical condition -based upon my evaluation of patient and discussion with family>> Patient is not longer able to make rational decisions regarding her own healthcare and well being.  She does not exhibit understanding of the consequences of her decisions regarding her healthcare and well being.  She continues to have impulsive behavior and is not able to make rational decisions regarding her person, finances or personal property.  Family states that patient continues to have gradual cognitive decline which has impaired the patient's ability to manage her finances and ability to care for herself  and perform appropriate activities of daily living.   Hypokalemia -Repleted -Magnesium 2.0   Syncope -pt had syncopal episode 4/4 -felt to be vasovagal -telemetry showed atrial paced with intermittent afib -appreciate cardiology consult   Frequent falls/failure to thrive -PT evaluation>>plan for SNF -Workup as discussed above   Persistent atrial fibrillation -Rate controlled -Continue amiodarone -Patient has declined anticoagulation in the past   Chronic respiratory failure with hypoxia -She is on 2 L  -Stable   Coronary artery disease -No chest pain presently -Reviewed [name] 07/15/22 note--patient has been on Brilinta in the past>> stopped temporarily for generator exchange on her pacemaker -Reviewed [name] 06/16/2022 note--Brilinta reduced to 60 mg twice daily -Continue Ranexa, imdur, and metoprolol succinate   Sinus node dysfunction -Status post PPM -Generator exchanged 07/15/2022 -Follow-up [name]   Essential hypertension -Continue metoprolol  succinate   Chronic diastolic CHF -Clinically euvolemic -Echo from 06/30/2022 with EF of 55 to 60%, no regional wall motion abnormalities    IPMN -As discussed above in the history -Outpatient follow-up with GI -CA 19-9 was noted to be normal at 14 back in February 2024.  -She will need repeat CT with pancreatic protocol as an outpatient   Anxiety -Patient is on chronic alprazolam -PDMP reviewed--alprazolam 1 mg, #90, last refill 10/04/22   COPD -Continue as needed bronchodilators -Stable       Family Communication:  grand daughter updated 4/7   Consultants:  none   Code Status:  DNR   DVT Prophylaxis:   [place] Lovenox   DISPO: patient is medically stable for d/c.  Barrier includes awaiting interim guardianship hearing on 4/9          Subjective: Patient denies fevers, chills, headache, chest pain, dyspnea, nausea, vomiting, diarrhea, abdominal pain   Objective: Vitals:   11/08/22 0410 11/08/22 1416 11/08/22 2008 11/09/22 0445  BP: (!) 156/76 125/83 (!) 142/92 (!) 140/66  Pulse: 68 62 67 (!) 59  Resp: 16  16 16   Temp: 97.8 F (36.6 C) 97.6 F (36.4 C) 97.8 F (36.6 C) (!) 97.5 F (36.4 C)  TempSrc: Axillary Oral Oral Oral  SpO2: 96% 100% 94% 96%  Weight:      Height:        Intake/Output Summary (Last 24 hours) at 11/09/2022 1347 Last data filed at 11/09/2022 1030 Gross per 24 hour  Intake 480 ml  Output --  Net 480 ml   Weight change:  Exam:  General:  Pt is alert, follows commands appropriately, not in acute distress HEENT: No icterus, No thrush, No neck mass, [place]/AT Cardiovascular: RRR, S1/S2, no rubs, no gallops Respiratory: diminished BS at bases.  No wheeze Abdomen: Soft/+BS, non tender, non distended, no guarding Extremities: No edema, No lymphangitis, No petechiae, No rashes, no synovitis   Data Reviewed: I have personally reviewed following labs and imaging studies Basic Metabolic Panel: Recent Labs  Lab 11/05/22 1833 11/06/22 0407  11/07/22 0013 11/07/22 0406 11/09/22 0404  NA 137 139 137 139 138  K 3.2* 3.0* 3.4* 3.6 3.3*  CL 103 106 107 108 106  CO2 23 25 23 24 25   GLUCOSE 98 86 91 84 92  BUN 15 11 13 11 12   CREATININE 0.84 0.66 0.80 0.72 0.81  CALCIUM 9.5 8.9 9.0 9.0 9.2  MG  --  2.0  --  2.0 1.9  PHOS  --  3.0  --   --  3.7   Liver Function Tests: Recent Labs  Lab 11/05/22 1833 11/06/22 0407  AST 33 26  ALT 23 20  ALKPHOS 80 68  BILITOT 1.1 1.3*  PROT 7.6 6.3*  ALBUMIN 3.9 3.2*   No results for input(s): "LIPASE", "AMYLASE" in the last 168 hours. Recent Labs  Lab 11/05/22 1833  AMMONIA 19   Coagulation Profile: Recent Labs  Lab 11/05/22 1833  INR 1.1   CBC: Recent Labs  Lab 11/05/22 1817 11/05/22 1908 11/06/22 0407 11/09/22 0404  WBC  --  8.6 6.7 6.4  NEUTROABS  --  5.9  --   --   HGB 13.6 13.1 11.9* 12.4  HCT 40.0 40.3 36.4 39.0  MCV  --  92.9 94.1 95.1  PLT  --  208 191 195   Cardiac Enzymes: No results for input(s): "CKTOTAL", "CKMB", "CKMBINDEX", "TROPONINI" in the last 168 hours. BNP: Invalid input(s): "POCBNP" CBG: Recent Labs  Lab 11/05/22 1811  GLUCAP 111*   HbA1C: No results for input(s): "HGBA1C" in the last 72 hours. Urine analysis:    Component Value Date/Time   COLORURINE YELLOW 11/05/2022 2210   APPEARANCEUR HAZY (A) 11/05/2022 2210   LABSPEC 1.023 11/05/2022 2210   PHURINE 8.0 11/05/2022 2210   GLUCOSEU >=500 (A) 11/05/2022 2210   HGBUR NEGATIVE 11/05/2022 2210   BILIRUBINUR NEGATIVE 11/05/2022 2210   KETONESUR 5 (A) 11/05/2022 2210   PROTEINUR NEGATIVE 11/05/2022 2210   UROBILINOGEN 0.2 12/07/2009 1532   NITRITE NEGATIVE 11/05/2022 2210   LEUKOCYTESUR TRACE (A) 11/05/2022 2210   Sepsis Labs: @LABRCNTIP (procalcitonin:4,lacticidven:4) ) Recent Results (from the past 240 hour(s))  Blood Culture (routine x 2)     Status: None   Collection Time: 11/01/22  5:30 AM   Specimen: BLOOD RIGHT FOREARM  Result Value Ref Range Status   Specimen  Description   Final    BLOOD RIGHT FOREARM BOTTLES DRAWN AEROBIC AND ANAEROBIC   Special Requests Blood Culture adequate volume  Final   Culture   Final    NO GROWTH 5 DAYS Performed at [facility], [address], [place], [place] [address]    Report Status 11/06/2022 FINAL  Final  Blood Culture (routine x 2)     Status: None   Collection Time: 11/01/22  5:38 AM   Specimen: BLOOD RIGHT HAND  Result Value Ref Range Status   Specimen Description   Final    BLOOD RIGHT HAND BOTTLES DRAWN AEROBIC AND ANAEROBIC  Special Requests Blood Culture adequate volume  Final   Culture   Final    NO GROWTH 5 DAYS Performed at [facility], [address] [place], [place] [address]    Report Status 11/06/2022 FINAL  Final  Blood culture (routine x 2)     Status: None (Preliminary result)   Collection Time: 11/05/22  8:23 PM   Specimen: BLOOD RIGHT FOREARM  Result Value Ref Range Status   Specimen Description BLOOD RIGHT FOREARM  Final   Special Requests   Final    BOTTLES DRAWN AEROBIC AND ANAEROBIC Blood Culture adequate volume   Culture   Final    NO GROWTH 4 DAYS Performed at [facility], [address] [place], [place] [address]    Report Status PENDING  Incomplete  Blood culture (routine x 2)     Status: None (Preliminary result)   Collection Time: 11/05/22  8:28 PM   Specimen: BLOOD RIGHT FOREARM  Result Value Ref Range Status   Specimen Description BLOOD RIGHT FOREARM  Final   Special Requests   Final    BOTTLES DRAWN AEROBIC ONLY Blood Culture adequate volume   Culture   Final    NO GROWTH 4 DAYS Performed at [facility], [address] [place], [place] [address]    Report Status PENDING  Incomplete     Scheduled Meds:  amiodarone  200 mg Oral Once per day on Mon Tue Wed Thu Fri Sat   aspirin EC  81 mg Oral Q breakfast   enoxaparin (LOVENOX) injection  40 mg Subcutaneous Q24H   folic acid  1 mg Oral Daily   isosorbide mononitrate  30 mg Oral Daily   metoprolol succinate   50 mg Oral BID   ranolazine  500 mg Oral BID   ticagrelor  60 mg Oral BID   Continuous Infusions:  Procedures/Studies: CT HEAD WO CONTRAST (5MM)  Result Date: 11/06/2022 CLINICAL DATA:  Neuro deficit, acute, stroke suspected. Altered mental status. EXAM: CT HEAD WITHOUT CONTRAST TECHNIQUE: Contiguous axial images were obtained from the base of the skull through the vertex without intravenous contrast. RADIATION DOSE REDUCTION: This exam was performed according to the departmental dose-optimization program which includes automated exposure control, adjustment of the mA and/or kV according to patient size and/or use of iterative reconstruction technique. COMPARISON:  Head CT 11/05/2022. FINDINGS: Brain: No acute intracranial hemorrhage. Unchanged mild chronic small-vessel disease with old lacunar infarct in the left lentiform nucleus. Gray-white differentiation is otherwise preserved. No hydrocephalus or extra-axial collection. No mass effect or midline shift. Vascular: No hyperdense vessel or unexpected calcification. Skull: No calvarial fracture or suspicious bone lesion. Skull base is unremarkable. Sinuses/Orbits: Unremarkable. Other: None. IMPRESSION: 1. No acute intracranial process. 2. Unchanged mild chronic small-vessel disease. Electronically Signed   By: Orvan FalconerWalter  Wiggins M.D.   On: 11/06/2022 08:27   DG Chest Port 1 View  Result Date: 11/05/2022 CLINICAL DATA:  Weakness, coronary artery disease, COPD EXAM: PORTABLE CHEST 1 VIEW COMPARISON:  11/01/2022 FINDINGS: The lungs are symmetrically well expanded. No pneumothorax or pleural effusion. Mild progressive central pulmonary vascular congestion and trace diffuse interstitial pulmonary edema, likely cardiogenic in nature, are new since prior examination. Cardiac size is within normal limits. Tortuosity and ectasia of the thoracic aorta is noted, better assessed on CT examination of 11/01/2022. Left subclavian dual lead pacemaker is unchanged.  IMPRESSION: 1. Developing, mild cardiogenic failure. 2. Tortuosity and ectasia of the thoracic aorta. Electronically Signed   By: Lyda KalataAshesh  Parikh M.D.  On: 11/05/2022 20:04   CT ANGIO HEAD NECK W WO CM W PERF (CODE STROKE)  Result Date: 11/05/2022 CLINICAL DATA:  Neuro deficit with acute stroke suspected. Right-sided weakness and facial droop EXAM: CT ANGIOGRAPHY HEAD AND NECK CT PERFUSION BRAIN TECHNIQUE: Multidetector CT imaging of the head and neck was performed using the standard protocol during bolus administration of intravenous contrast. Multiplanar CT image reconstructions and MIPs were obtained to evaluate the vascular anatomy. Carotid stenosis measurements (when applicable) are obtained utilizing NASCET criteria, using the distal internal carotid diameter as the denominator. Multiphase CT imaging of the brain was performed following IV bolus contrast injection. Subsequent parametric perfusion maps were calculated using RAPID software. RADIATION DOSE REDUCTION: This exam was performed according to the departmental dose-optimization program which includes automated exposure control, adjustment of the mA and/or kV according to patient size and/or use of iterative reconstruction technique. CONTRAST:  OMNIPAQUE IOHEXOL 350 MG/ML SOLN COMPARISON:  Head CT from earlier today. FINDINGS: Aortic arch: Atheromatous plaque.  Three vessel branching Right carotid system: Atheromatous plaque at the bifurcation. High-grade stenosis is suspected although there is limited quantification due to more shin artifact. Narrowing is over 50%. Left carotid system: Atheromatous plaque at the common carotid origin and at the bifurcation without flow reducing stenosis or ulceration. Vertebral arteries: Calcified plaque at the proximal right subclavian causes 70% stenosis as measured on coronal reformats. Advanced narrowing at the right vertebral origin due to atheromatous plaque. Moderate narrowing at the left vertebral  origin. No dissection or beading. Skeleton: Ordinary cervical spine degeneration Other neck: No acute finding Upper chest: Emphysema. Review of the MIP images confirms the above findings CTA HEAD FINDINGS Anterior circulation: Limited by motion. Extensive atheromatous calcification of the cavernous carotids. There may be significant narrowing at the right MCA bifurcation, although degraded by motion. No emergent large vessel occlusion Posterior circulation: Right dominant vertebral artery. Before atheromatous plaque with high-grade narrowing affecting the right more than left vertebral artery. Moderately extensive atheromatous irregularity of the basilar. Severe left P1 segment stenosis with downstream underfilling. Bilateral PCA atheromatous irregularity is extensive. Venous sinuses: Unremarkable. CT Brain Perfusion Findings: ASPECTS: 10 CBF (<30%) Volume: 0mL Perfusion (Tmax>6.0s) volume: 3mL There is notable motion artifact. IMPRESSION: 1. Limited study due to motion artifact. 2. Severe narrowing at the left P1 segment. On coronal thick MIPS there is question of luminal filling defect but given the degree of motion this may instead be atheromatous. 3. Right ICA origin stenosis that is likely flow reducing with possible downstream underfilling. 4. 70% narrowing at the right subclavian origin. 5. High-grade narrowing of the right vertebral artery at its origin and vertebrobasilar junction. Advanced narrowing of the non dominant left V4 segment as well. 6. Moderate distal basilar stenosis. 7. Severe PCA atheromatous irregularity. 8. Noncontributory CT perfusion. Electronically Signed   By: Tiburcio Pea M.D.   On: 11/05/2022 19:26   CT Cervical Spine Wo Contrast  Result Date: 11/05/2022 CLINICAL DATA:  Trauma, multiple falls EXAM: CT CERVICAL SPINE WITHOUT CONTRAST TECHNIQUE: Multidetector CT imaging of the cervical spine was performed without intravenous contrast. Multiplanar CT image reconstructions were also  generated. RADIATION DOSE REDUCTION: This exam was performed according to the departmental dose-optimization program which includes automated exposure control, adjustment of the mA and/or kV according to patient size and/or use of iterative reconstruction technique. COMPARISON:  11/01/2022 FINDINGS: There is patient motion in some of the images limiting the study. Alignment: Alignment of posterior margins of vertebral bodies is within normal limits.  Skull base and vertebrae: No recent fracture is seen. Soft tissues and spinal canal: Posterior bony spurs causing mild extrinsic pressure on the ventral margin of thecal sac from C3 to C6 levels. Disc levels: There is minimal encroachment of right neural foramen at C2-C3 level. There is mild encroachment of neural foramina from C3-C7 levels. Upper chest: Linear patchy pleural densities are seen in both apices. Other: Thyroid is enlarged with inhomogeneous attenuation. IMPRESSION: Motion limited study. No recent fracture is seen in cervical spine. Cervical spondylosis with encroachment of neural foramina at multiple levels. There is mild extrinsic pressure over the ventral margin of thecal sac caused by posterior bony spurs without significant central spinal stenosis. Thyroid is enlarged with inhomogeneous attenuation. Nonemergent thyroid sonogram may be considered. Electronically Signed   By: Ernie Avena M.D.   On: 11/05/2022 19:17   CT HEAD CODE STROKE WO CONTRAST  Result Date: 11/05/2022 CLINICAL DATA:  Code stroke. Neuro deficit with acute stroke suspected EXAM: CT HEAD WITHOUT CONTRAST TECHNIQUE: Contiguous axial images were obtained from the base of the skull through the vertex without intravenous contrast. RADIATION DOSE REDUCTION: This exam was performed according to the departmental dose-optimization program which includes automated exposure control, adjustment of the mA and/or kV according to patient size and/or use of iterative reconstruction  technique. COMPARISON:  Head CT 11/01/2022 FINDINGS: Brain: No evidence of acute infarction, hemorrhage, hydrocephalus, extra-axial collection or mass lesion/mass effect. Chronic small vessel ischemia with chronic perforator infarct at the left corona radiata. Vascular: No hyperdense vessel or unexpected calcification. Skull: Normal. Negative for fracture or focal lesion. Sinuses/Orbits: No acute finding. Other: Prelim sent in epic chat. ASPECTS [facility] Stroke Program Early CT [name]) Not scored without localizing history. IMPRESSION: No acute or interval finding. Electronically Signed   By: Tiburcio Pea M.D.   On: 11/05/2022 18:32   CT CHEST ABDOMEN PELVIS W CONTRAST  Result Date: 11/01/2022 CLINICAL DATA:  Unwitnessed fall.  Blunt poly trauma. EXAM: CT CHEST, ABDOMEN, AND PELVIS WITH CONTRAST TECHNIQUE: Multidetector CT imaging of the chest, abdomen and pelvis was performed following the standard protocol during bolus administration of intravenous contrast. RADIATION DOSE REDUCTION: This exam was performed according to the departmental dose-optimization program which includes automated exposure control, adjustment of the mA and/or kV according to patient size and/or use of iterative reconstruction technique. CONTRAST:  OMNIPAQUE IOHEXOL 300 MG/ML  SOLN COMPARISON:  06/29/2022 FINDINGS: CT CHEST FINDINGS Cardiovascular: No significant vascular findings. Normal heart size. No pericardial effusion. Dual-chamber pacer leads in unremarkable position. Atheromatous calcification of the aorta and coronaries. Mediastinum/Nodes: No hematoma or pneumomediastinum Lungs/Pleura: There is no edema, consolidation, effusion, or pneumothorax. Musculoskeletal: No acute finding CT ABDOMEN PELVIS FINDINGS Hepatobiliary: No hepatic injury or perihepatic hematoma. Gallbladder is surgically absent Pancreas: Prominent main duct size (up to 3 mm) with multiple cystic densities in the head and body, measuring up to 9 mm at the  uncinate process. MRCP was recommended 05/01/2022, follow-up to depend on comorbidities. No acute finding. Spleen: Coarse calcifications.  No visible injury Adrenals/Urinary Tract: Left renal cortical scarring. Stomach/Bowel: No evidence of injury Vascular/Lymphatic: No evidence of injury. Extensive atheromatous calcification. Reproductive: Hysterectomy. Other: No ascites or pneumoperitoneum Musculoskeletal: Generalized osteopenia. Chronic L1 inferior endplate fracture. Generalized osteopenia. No evidence of acute injury. IMPRESSION: 1. No evidence of injury to the chest or abdomen. 2. Chronic findings, including cystic pancreas lesions, are stable from previous imaging and described above. Electronically Signed   By: Tiburcio Pea M.D.   On: 11/01/2022  06:22   DG Pelvis Portable  Result Date: 11/01/2022 CLINICAL DATA:  Fall EXAM: PORTABLE PELVIS 1 VIEWS COMPARISON:  10/07/2022 FINDINGS: There is no evidence of pelvic fracture or diastasis. No pelvic bone lesions are seen. Generalized osteopenia. IMPRESSION: Negative. Electronically Signed   By: Tiburcio Pea M.D.   On: 11/01/2022 05:29   CT HEAD WO CONTRAST ( )  Result Date: 11/01/2022 CLINICAL DATA:  Head trauma, minor.  Unwitnessed fall. EXAM: CT HEAD WITHOUT CONTRAST CT CERVICAL SPINE WITHOUT CONTRAST TECHNIQUE: Multidetector CT imaging of the head and cervical spine was performed following the standard protocol without intravenous contrast. Multiplanar CT image reconstructions of the cervical spine were also generated. RADIATION DOSE REDUCTION: This exam was performed according to the departmental dose-optimization program which includes automated exposure control, adjustment of the mA and/or kV according to patient size and/or use of iterative reconstruction technique. COMPARISON:  Five days ago FINDINGS: CT HEAD FINDINGS Brain: No evidence of acute infarction, hemorrhage, hydrocephalus, extra-axial collection or mass lesion/mass effect. Chronic  small vessel ischemia in the cerebral white matter with chronic perforator infarct at the left basal ganglia. Vascular: No hyperdense vessel or unexpected calcification. Skull: Normal. Negative for fracture or focal lesion. Sinuses/Orbits: No evidence of injury. CT CERVICAL SPINE FINDINGS Alignment: Normal. Skull base and vertebrae: No acute fracture. No primary bone lesion or focal pathologic process. Soft tissues and spinal canal: No prevertebral fluid or swelling. No visible canal hematoma. Disc levels:  Ordinary and generalized cervical spine degeneration Upper chest: Biapical scarring with no evidence of injury. IMPRESSION: No evidence of acute intracranial or cervical spine injury. Electronically Signed   By: Tiburcio Pea M.D.   On: 11/01/2022 05:28   CT CERVICAL SPINE WO CONTRAST  Result Date: 11/01/2022 CLINICAL DATA:  Head trauma, minor.  Unwitnessed fall. EXAM: CT HEAD WITHOUT CONTRAST CT CERVICAL SPINE WITHOUT CONTRAST TECHNIQUE: Multidetector CT imaging of the head and cervical spine was performed following the standard protocol without intravenous contrast. Multiplanar CT image reconstructions of the cervical spine were also generated. RADIATION DOSE REDUCTION: This exam was performed according to the departmental dose-optimization program which includes automated exposure control, adjustment of the mA and/or kV according to patient size and/or use of iterative reconstruction technique. COMPARISON:  Five days ago FINDINGS: CT HEAD FINDINGS Brain: No evidence of acute infarction, hemorrhage, hydrocephalus, extra-axial collection or mass lesion/mass effect. Chronic small vessel ischemia in the cerebral white matter with chronic perforator infarct at the left basal ganglia. Vascular: No hyperdense vessel or unexpected calcification. Skull: Normal. Negative for fracture or focal lesion. Sinuses/Orbits: No evidence of injury. CT CERVICAL SPINE FINDINGS Alignment: Normal. Skull base and vertebrae: No  acute fracture. No primary bone lesion or focal pathologic process. Soft tissues and spinal canal: No prevertebral fluid or swelling. No visible canal hematoma. Disc levels:  Ordinary and generalized cervical spine degeneration Upper chest: Biapical scarring with no evidence of injury. IMPRESSION: No evidence of acute intracranial or cervical spine injury. Electronically Signed   By: Tiburcio Pea M.D.   On: 11/01/2022 05:28   DG Chest Port 1 View  Result Date: 11/01/2022 CLINICAL DATA:  87 year old female with possible sepsis. EXAM: PORTABLE CHEST 1 VIEW COMPARISON:  Chest x-ray 10/27/2022. FINDINGS: Lung volumes are very low. Low opacity at the left base favored to reflect small left pleural effusion with associated passive subsegmental atelectasis in the left lower lobe. No definite consolidative airspace disease. No right pleural effusion. No pneumothorax. No evidence of pulmonary edema. Heart size is normal.  The patient is severely rotated to the left on today's exam, resulting in distortion of the mediastinal contours and reduced diagnostic sensitivity and specificity for mediastinal pathology. Atherosclerotic calcifications in the thoracic aorta. Left-sided pacemaker device in place with lead tips projecting over the expected location of the right atrium and right ventricle. Soft tissue anchor in the right humeral head incidentally noted. IMPRESSION: 1. Low lung volumes with small left pleural effusion and probable subsegmental atelectasis in the left lung base. 2. Aortic atherosclerosis. Electronically Signed   By: Trudie Reed M.D.   On: 11/01/2022 05:26   DG Chest 2 View  Result Date: 10/27/2022 CLINICAL DATA:  Altered mental status EXAM: CHEST - 2 VIEW COMPARISON:  06/29/2022 FINDINGS: Left-sided pacing device as before. Cardiomegaly. Heterogeneous ground-glass opacities in the right upper lobe and left mid to lower lung. Aortic atherosclerosis. No pneumothorax. IMPRESSION: Heterogeneous  ground-glass opacities in the right upper lobe and left mid to lower lung, suspect for multifocal pneumonia. Cardiomegaly. Electronically Signed   By: Jasmine Pang M.D.   On: 10/27/2022 21:58   CT Head Wo Contrast  Result Date: 10/27/2022 CLINICAL DATA:  Altered mental status EXAM: CT HEAD WITHOUT CONTRAST TECHNIQUE: Contiguous axial images were obtained from the base of the skull through the vertex without intravenous contrast. RADIATION DOSE REDUCTION: This exam was performed according to the departmental dose-optimization program which includes automated exposure control, adjustment of the mA and/or kV according to patient size and/or use of iterative reconstruction technique. COMPARISON:  CT brain 12/11/2021 FINDINGS: Brain: No acute territorial infarction, hemorrhage or intracranial mass. Probable chronic lacunar infarct left basal ganglia. Patchy white matter hypodensity consistent with chronic small vessel ischemic change. Mild atrophy. Nonenlarged ventricles Vascular: No hyperdense vessels.  Carotid vascular calcification Skull: Normal. Negative for fracture or focal lesion. Sinuses/Orbits: No acute finding. Other: None IMPRESSION: 1. No CT evidence for acute intracranial abnormality. 2. Atrophy and chronic small vessel ischemic changes of the white matter. Electronically Signed   By: Jasmine Pang M.D.   On: 10/27/2022 21:32   CUP PACEART REMOTE DEVICE CHECK  Result Date: 10/23/2022 Scheduled remote reviewed. Normal device function.  Next remote 91 days. [place]   Catarina Hartshorn, DO  [facility]  If 7PM-7AM, please contact night-coverage [email or web address] Password [email or web address] 11/09/2022, 1:47 PM   LOS: 3 days

## 2022-11-10 ENCOUNTER — Ambulatory Visit: Payer: Self-pay | Admitting: *Deleted

## 2022-11-10 DIAGNOSIS — I495 Sick sinus syndrome: Secondary | ICD-10-CM | POA: Diagnosis not present

## 2022-11-10 DIAGNOSIS — I5032 Chronic diastolic (congestive) heart failure: Secondary | ICD-10-CM | POA: Diagnosis not present

## 2022-11-10 DIAGNOSIS — I482 Chronic atrial fibrillation, unspecified: Secondary | ICD-10-CM | POA: Diagnosis not present

## 2022-11-10 DIAGNOSIS — R531 Weakness: Secondary | ICD-10-CM | POA: Diagnosis not present

## 2022-11-10 LAB — BASIC METABOLIC PANEL
Anion gap: 6 (ref 5–15)
BUN: 14 mg/dL (ref 8–23)
CO2: 26 mmol/L (ref 22–32)
Calcium: 9.2 mg/dL (ref 8.9–10.3)
Chloride: 105 mmol/L (ref 98–111)
Creatinine, Ser: 0.74 mg/dL (ref 0.44–1.00)
GFR, Estimated: >60 mL/min (ref >60)
Glucose, Bld: 86 mg/dL (ref 70–99)
Potassium: 3.6 mmol/L (ref 3.5–5.1)
Sodium: 137 mmol/L (ref 135–145)

## 2022-11-10 LAB — CULTURE, BLOOD (ROUTINE X 2)
Culture: NO GROWTH
Culture: NO GROWTH
Special Requests: ADEQUATE
Special Requests: ADEQUATE

## 2022-11-10 LAB — HEMOGLOBIN AND HEMATOCRIT, BLOOD
HCT: 37.9 % (ref 36.0–46.0)
Hemoglobin: 12.1 g/dL (ref 12.0–15.0)

## 2022-11-10 LAB — MAGNESIUM: Magnesium: 1.9 mg/dL (ref 1.7–2.4)

## 2022-11-10 MED ORDER — POTASSIUM CHLORIDE CRYS ER 20 MEQ PO TBCR
20.0000 meq | EXTENDED_RELEASE_TABLET | Freq: Once | ORAL | Status: AC
  Administered 2022-11-10: 20 meq via ORAL
  Filled 2022-11-10: qty 1

## 2022-11-10 MED ORDER — MAGNESIUM OXIDE -MG SUPPLEMENT 400 (240 MG) MG PO TABS
400.0000 mg | ORAL_TABLET | Freq: Once | ORAL | Status: AC
  Administered 2022-11-10: 400 mg via ORAL
  Filled 2022-11-10: qty 1

## 2022-11-10 NOTE — Progress Notes (Signed)
Physical Therapy Treatment Patient Details Name: [name] MRN: [id number] DOB: 01-Jul-1936 Today's Date: 11/10/2022   History of Present Illness [name] is an 87 y.o. female with medical history significant of chronic respiratory failure on supplemental oxygen at 2 LPM at night, COPD, atrial fibrillation, CHF, suspected pancreatic cancer, tachycardia-bradycardia syndrome s/p pacemaker, CAD who presented to the emergency department due to strokelike symptoms.  Patient was unable to provide history, history was obtained from EDP and ED medical record.  Per report, granddaughter reported that patient last well known time was 8:30PM, she was noted to be confused when seen today around 10 AM by granddaughter.  Patient was reported to have had multiple falls at home and was noted with right-sided weakness.  She sustained another fall while trying to pick up a call from her Dr. this morning.  EMS was initially activated, but patient refused to go to the hospital, EMS was reactivated later in the day by granddaughter due to concern for patient's right-sided weakness and right-sided facial droop with difficulty in being able to follow commands.  Patient was recently admitted from 3/25 to 10/30/2022 due to community acquired pneumonia, primary delusional and hypertensive urgency.    PT Comments    Patient agreeable for therapy.  Patient demonstrates increased endurance/distance for gait training with slow labored cadence without loss of balance, limited mostly due to fatigue and put back to bed after therapy with nursing staff present in room.  Patient will benefit from continued skilled physical therapy in hospital and recommended venue below to increase strength, balance, endurance for safe ADLs and gait.     Recommendations for follow up therapy are one component of a multi-disciplinary discharge planning process, led by the attending physician.  Recommendations may be updated based on patient status,  additional functional criteria and insurance authorization.  Follow Up Recommendations  Can patient physically be transported by private vehicle: Yes    Assistance Recommended at Discharge Frequent or constant Supervision/Assistance  Patient can return home with the following A little help with walking and/or transfers;A little help with bathing/dressing/bathroom;Help with stairs or ramp for entrance;Assistance with cooking/housework   Equipment Recommendations  None recommended by PT    Recommendations for Other Services       Precautions / Restrictions Precautions Precautions: Fall Restrictions Weight Bearing Restrictions: No     Mobility  Bed Mobility Overal bed mobility: Needs Assistance Bed Mobility: Supine to Sit, Sit to Supine     Supine to sit: Supervision, Min guard Sit to supine: Supervision, Min guard   General bed mobility comments: increased time, labored movement    Transfers Overall transfer level: Needs assistance Equipment used: Rolling walker (2 wheels) Transfers: Sit to/from Stand, Bed to chair/wheelchair/BSC Sit to Stand: Min guard   Step pivot transfers: Min guard       General transfer comment: slightly labored movement    Ambulation/Gait Ambulation/Gait assistance: Min guard, Min assist Gait Distance (Feet): 45 Feet Assistive device: Rolling walker (2 wheels) Gait Pattern/deviations: Decreased step length - left, Decreased stance time - right, Decreased stride length Gait velocity: decreased     General Gait Details: slow labored cadence wtihout loss of balance, limited mostly due to fatigue   Stairs             Wheelchair Mobility    Modified Rankin (Stroke Patients Only)       Balance Overall balance assessment: Needs assistance Sitting-balance support: Feet supported, No upper extremity supported Sitting balance-Leahy Scale: Good  Sitting balance - Comments: seated at EOB   Standing balance support: During  functional activity, No upper extremity supported Standing balance-Leahy Scale: Poor Standing balance comment: fair using RW                            Cognition Arousal/Alertness: Awake/alert Behavior During Therapy: WFL for tasks assessed/performed Overall Cognitive Status: No family/caregiver present to determine baseline cognitive functioning                                 General Comments: Hard of hearing (good ear on left side)        Exercises Total Joint Exercises Ankle Circles/Pumps: Supine, 10 reps, Both, Strengthening, AROM Heel Slides: AROM, Strengthening, Both, 10 reps, Supine    General Comments        Pertinent Vitals/Pain Pain Assessment Pain Assessment: No/denies pain    Home Living                          Prior Function            PT Goals (current goals can now be found in the care plan section) Acute Rehab PT Goals Patient Stated Goal: "go home" PT Goal Formulation: With patient Time For Goal Achievement: 11/20/22 Potential to Achieve Goals: Good Progress towards PT goals: Progressing toward goals    Frequency    Min 3X/week      PT Plan Current plan remains appropriate    Co-evaluation              AM-PAC PT "6 Clicks" Mobility   Outcome Measure  Help needed turning from your back to your side while in a flat bed without using bedrails?: A Little Help needed moving from lying on your back to sitting on the side of a flat bed without using bedrails?: A Little Help needed moving to and from a bed to a chair (including a wheelchair)?: A Little Help needed standing up from a chair using your arms (e.g., wheelchair or bedside chair)?: A Little Help needed to walk in hospital room?: A Lot Help needed climbing 3-5 steps with a railing? : A Lot 6 Click Score: 16    End of Session Equipment Utilized During Treatment: Gait belt Activity Tolerance: Patient tolerated treatment well;Patient limited  by fatigue Patient left: in bed;with bed alarm set;with nursing/sitter in room Nurse Communication: Mobility status PT Visit Diagnosis: Unsteadiness on feet (R26.81);Other abnormalities of gait and mobility (R26.89);Muscle weakness (generalized) (M62.81)     Time: 6067-7034 PT Time Calculation (min) (ACUTE ONLY): 13 min  Charges:  $Therapeutic Activity: 8-22 mins                     3:06 PM, 11/10/22 Ocie Bob, MPT Physical Therapist with [facility] 336 [phone] office [phone] mobile phone

## 2022-11-10 NOTE — Plan of Care (Signed)
Patient AOX3, disoriented to time fully.  VSS throughout shift.  All meds given on time as ordered.  Metoprolol held d/t HR 59.  Pt denied pain.  Diminished lungs, IS encouraged.  Purewick in place.  Pt has impaired hearing, hearing aids at home.  Pt encouraged to get family to bring in hearing aids.  Pt said that she throws away all her meds at home since it's more harmful than good and that God would care for her.  When trying to educate pt on medication compliance importance, she couldn't hear well.  Needs follow up.  POC maintained, will continue to monitor.  Problem: Education: Goal: Knowledge of General Education information will improve Description: Including pain rating scale, medication(s)/side effects and non-pharmacologic comfort measures Outcome: Progressing   Problem: Health Behavior/Discharge Planning: Goal: Ability to manage health-related needs will improve Outcome: Progressing   Problem: Clinical Measurements: Goal: Ability to maintain clinical measurements within normal limits will improve Outcome: Progressing Goal: Will remain free from infection Outcome: Progressing Goal: Diagnostic test results will improve Outcome: Progressing Goal: Respiratory complications will improve Outcome: Progressing Goal: Cardiovascular complication will be avoided Outcome: Progressing   Problem: Activity: Goal: Risk for activity intolerance will decrease Outcome: Progressing   Problem: Nutrition: Goal: Adequate nutrition will be maintained Outcome: Progressing   Problem: Coping: Goal: Level of anxiety will decrease Outcome: Progressing   Problem: Elimination: Goal: Will not experience complications related to bowel motility Outcome: Progressing Goal: Will not experience complications related to urinary retention Outcome: Progressing   Problem: Pain Managment: Goal: General experience of comfort will improve Outcome: Progressing   Problem: Safety: Goal: Ability to remain  free from injury will improve Outcome: Progressing   Problem: Skin Integrity: Goal: Risk for impaired skin integrity will decrease Outcome: Progressing   Problem: Education: Goal: Knowledge of General Education information will improve Description: Including pain rating scale, medication(s)/side effects and non-pharmacologic comfort measures Outcome: Progressing   Problem: Health Behavior/Discharge Planning: Goal: Ability to manage health-related needs will improve Outcome: Progressing   Problem: Clinical Measurements: Goal: Ability to maintain clinical measurements within normal limits will improve Outcome: Progressing Goal: Will remain free from infection Outcome: Progressing Goal: Diagnostic test results will improve Outcome: Progressing Goal: Respiratory complications will improve Outcome: Progressing Goal: Cardiovascular complication will be avoided Outcome: Progressing   Problem: Activity: Goal: Risk for activity intolerance will decrease Outcome: Progressing   Problem: Nutrition: Goal: Adequate nutrition will be maintained Outcome: Progressing   Problem: Coping: Goal: Level of anxiety will decrease Outcome: Progressing   Problem: Elimination: Goal: Will not experience complications related to bowel motility Outcome: Progressing Goal: Will not experience complications related to urinary retention Outcome: Progressing   Problem: Pain Managment: Goal: General experience of comfort will improve Outcome: Progressing   Problem: Safety: Goal: Ability to remain free from injury will improve Outcome: Progressing   Problem: Skin Integrity: Goal: Risk for impaired skin integrity will decrease Outcome: Progressing   Problem: Education: Goal: Knowledge of General Education information will improve Description: Including pain rating scale, medication(s)/side effects and non-pharmacologic comfort measures Outcome: Progressing   Problem: Health Behavior/Discharge  Planning: Goal: Ability to manage health-related needs will improve Outcome: Progressing   Problem: Clinical Measurements: Goal: Ability to maintain clinical measurements within normal limits will improve Outcome: Progressing Goal: Will remain free from infection Outcome: Progressing Goal: Diagnostic test results will improve Outcome: Progressing Goal: Respiratory complications will improve Outcome: Progressing Goal: Cardiovascular complication will be avoided Outcome: Progressing   Problem: Activity: Goal: Risk for activity  intolerance will decrease Outcome: Progressing   Problem: Nutrition: Goal: Adequate nutrition will be maintained Outcome: Progressing   Problem: Coping: Goal: Level of anxiety will decrease Outcome: Progressing   Problem: Elimination: Goal: Will not experience complications related to bowel motility Outcome: Progressing Goal: Will not experience complications related to urinary retention Outcome: Progressing   Problem: Pain Managment: Goal: General experience of comfort will improve Outcome: Progressing   Problem: Safety: Goal: Ability to remain free from injury will improve Outcome: Progressing   Problem: Skin Integrity: Goal: Risk for impaired skin integrity will decrease Outcome: Progressing

## 2022-11-10 NOTE — Progress Notes (Signed)
PROGRESS NOTE  [name] ZOX:[id number] DOB: [birth year] DOA: 11/05/2022 PCP: [name], MD  Brief History:  87 year old female with a history of chronic respiratory failure on 2 L, atrial fibrillation, IPMN, tachybradycardia syndrome status post PPM, coronary artery disease, hyperlipidemia presenting with confusion and right-sided weakness.  The patient was not able to provide any significant history secondary to her confusion.  History is obtained from review the medical record and speaking with the patient's family.  Notably, the patient was last noted to be normal when she spoke on the phone with her granddaughter 8:30 PM on 11/04/2022.  The patient's granddaughter checked on her at 21 AM on 11/05/2022 and found the patient to be sitting in the chair with her Bible mumbling.  The patient received a call from a doctor's office and while getting up to take the call, the patient had a mechanical fall.  Apparently the patient has fallen 3-4 times in the last 24 hours.  EMS was activated, but the patient refused to come to the hospital.  Later on the day, the patient had an additional fall, and there was concern with the patient having some right-sided weakness and trouble speaking.  EMS was called and code stroke was activated. Notably, the patient was recently admitted to the hospital from 10/27/2022 to 10/30/2022.  The patient was treated for pneumonia with ceftriaxone and doxycycline.  Apparently the patient was having paranoid delusions at that time.  She was evaluated by behavioral health and was not found to have any indication for inpatient psychiatric admission.  She was recommended to go to SNF after PT evaluation, but patient refused and family subsequently took the patient home. In speaking with the patient's granddaughter, it appears that [name] is the patient's main advocate and caretaker. [name] states that the patient has not been taking her medications properly.  The  patient frequently forgets to take her medications.  In the last 6 months, the patient has had a cognitive decline with increasing paranoid behavior.  In addition, the patient the patient has also had some impulsive behavior.  Patient has a history of mixed type IPMN.  She was initially seen on 12/03/2021 by [name], and she was found  to have a growing pancreatic cystic lesion with associated abdominal pain.  Due to these she was referred for endoscopic ultrasound.  Underwent endoscopic ultrasound with [name] on 02/24/2022 which found Anechoic lesions suggestive of three cysts were identified in the pancreatic head.  Cytology showed an acellular specimen.  The case was ultimately discussed at multidisciplinary tumor board.   They considered that she was presenting changes concerning for a mixed branch duct/main duct IPMN.  It was considered that given her medical comorbidities she would not be the best surgical candidate but she will continue monitoring. They  decided that she should have a repeat CT pancreas protocol in 6 months.  Depending on the findings on imaging, may need to discuss again the benefits versus risk of undergoing surgical resection.  patient underwent a CT of the abdomen with and without IV contrast on 05/01/2022 which showed decrease size of the cystic lesions of the pancreatic head and uncinate process measuring up to 15 mm.  She was advised to have a repeat CT pancreas protocol in 6 months.  She was last seen in the GI office on 09/25/2022 at which time there were plans for repeat imaging later this year.  Throughout the hospitalization, the patient remained alert and oriented, although not fully to time.  She would intermittently become agitated.  She remained intermittently confused and have episodes of paranoia.  Clearly, the patient continued to not exhibit capacity to make decisions regarding her own person, property, or healthcare.  She continued to lack the insight to  understand the consequences of her decisions.   Assessment/Plan:  Acute metabolic encephalopathy -Multifactorial including electrolyte disturbance, dehydration, and progression of underlying cognitive impairment -Serum [id number] -TSH 1.218 -UA negative for pyuria -4/3 CT brain negative for acute findings -4/4 repeat CT brain--neg -CTA head and neck negative for LVO -EEG--deferred due to pt refusal and agitation -Check VBG 7.43/49/<31/40 -Ammonia 19 -RPR--neg -Patient remains confused with intermittent agitation -this is likely the patient's baseline representing progression of underlying cognitive impairment -It does not appear that the patient currently has capacity to make her own decisions.  She has poor insight regarding her medical condition -based upon my evaluation of patient and discussion with family>> Patient is not longer able to make rational decisions regarding her own healthcare and well being.  She does not exhibit understanding of the consequences of her decisions regarding her healthcare and well being.  She continues to have impulsive behavior and is not able to make rational decisions regarding her person, finances or personal property.  Family states that patient continues to have gradual cognitive decline which has impaired the patient's ability to manage her finances and ability to care for herself  and perform appropriate activities of daily living.   Hypokalemia -Repleted -Magnesium 2.0   Syncope -pt had syncopal episode 4/4 -felt to be vasovagal -telemetry showed atrial paced with intermittent afib -appreciate cardiology consult   Frequent falls/failure to thrive -PT evaluation>>plan for SNF -Workup as discussed above   Persistent atrial fibrillation -Rate controlled -Continue amiodarone -Patient has declined anticoagulation in the past   Chronic respiratory failure with hypoxia -She is on 2 L  -Stable   Coronary artery disease -No chest pain  presently -Reviewed Dr. Lubertha Basque 07/15/22 note--patient has been on Brilinta in the past>> stopped temporarily for generator exchange on her pacemaker -Reviewed Dr. Ival Bible 06/16/2022 note--Brilinta reduced to 60 mg twice daily -Continue Ranexa, imdur, and metoprolol succinate   Sinus node dysfunction -Status post PPM -Generator exchanged 07/15/2022 -Follow-up Dr. Sharrell Ku   Essential hypertension -Continue metoprolol succinate   Chronic diastolic CHF -Clinically euvolemic -Echo from 06/30/2022 with EF of 55 to 60%, no regional wall motion abnormalities    IPMN -As discussed above in the history -Outpatient follow-up with GI -CA 19-9 was noted to be normal at 14 back in February 2024.  -She will need repeat CT with pancreatic protocol as an outpatient   Anxiety -Patient is on chronic alprazolam -PDMP reviewed--alprazolam 1 mg, #90, last refill 10/04/22   COPD -Continue as needed bronchodilators -Stable       Family Communication:  grand daughter updated 4/7   Consultants:  none   Code Status:  DNR   DVT Prophylaxis:   [place] Lovenox   DISPO: patient is medically stable for d/c.  Barrier includes awaiting interim guardianship hearing on 4/9              Subjective: Patient denies fevers, chills, headache, chest pain, dyspnea, nausea, vomiting, diarrhea, abdominal pain, dysuria, hematuria, hematochezia, and melena.   Objective: Vitals:   11/09/22 1400 11/09/22 2100 11/10/22 0500 11/10/22 1012  BP: 101/63 (!) 135/58 (!) 145/56 (!) 151/79  Pulse: 64 (!) 59 60  61  Resp: 18 16 14    Temp:  97.9 F (36.6 C) 97.7 F (36.5 C)   TempSrc:  Oral Oral   SpO2: 98% 99% 97%   Weight:      Height:        Intake/Output Summary (Last 24 hours) at 11/10/2022 1336 Last data filed at 11/10/2022 1239 Gross per 24 hour  Intake 620 ml  Output --  Net 620 ml   Weight change:  Exam:  General:  Pt is alert, follows commands appropriately, not in acute distress HEENT:  No icterus, No thrush, No neck mass, Speculator/AT Cardiovascular: RRR, S1/S2, no rubs, no gallops Respiratory: CTA bilaterally, no wheezing, no crackles, no rhonchi Abdomen: Soft/+BS, non tender, non distended, no guarding Extremities: No edema, No lymphangitis, No petechiae, No rashes, no synovitis   Data Reviewed: I have personally reviewed following labs and imaging studies Basic Metabolic Panel: Recent Labs  Lab 11/06/22 0407 11/07/22 0013 11/07/22 0406 11/09/22 0404 11/10/22 0451  NA 139 137 139 138 137  K 3.0* 3.4* 3.6 3.3* 3.6  CL 106 107 108 106 105  CO2 25 23 24 25 26   GLUCOSE 86 91 84 92 86  BUN 11 13 11 12 14   CREATININE 0.66 0.80 0.72 0.81 0.74  CALCIUM 8.9 9.0 9.0 9.2 9.2  MG 2.0  --  2.0 1.9 1.9  PHOS 3.0  --   --  3.7  --    Liver Function Tests: Recent Labs  Lab 11/05/22 1833 11/06/22 0407  AST 33 26  ALT 23 20  ALKPHOS 80 68  BILITOT 1.1 1.3*  PROT 7.6 6.3*  ALBUMIN 3.9 3.2*   No results for input(s): "LIPASE", "AMYLASE" in the last 168 hours. Recent Labs  Lab 11/05/22 1833  AMMONIA 19   Coagulation Profile: Recent Labs  Lab 11/05/22 1833  INR 1.1   CBC: Recent Labs  Lab 11/05/22 1817 11/05/22 1908 11/06/22 0407 11/09/22 0404 11/10/22 0451  WBC  --  8.6 6.7 6.4  --   NEUTROABS  --  5.9  --   --   --   HGB 13.6 13.1 11.9* 12.4 12.1  HCT 40.0 40.3 36.4 39.0 37.9  MCV  --  92.9 94.1 95.1  --   PLT  --  208 191 195  --    Cardiac Enzymes: No results for input(s): "CKTOTAL", "CKMB", "CKMBINDEX", "TROPONINI" in the last 168 hours. BNP: Invalid input(s): "POCBNP" CBG: Recent Labs  Lab 11/05/22 1811  GLUCAP 111*   HbA1C: No results for input(s): "HGBA1C" in the last 72 hours. Urine analysis:    Component Value Date/Time   COLORURINE YELLOW 11/05/2022 2210   APPEARANCEUR HAZY (A) 11/05/2022 2210   LABSPEC 1.023 11/05/2022 2210   PHURINE 8.0 11/05/2022 2210   GLUCOSEU >=500 (A) 11/05/2022 2210   HGBUR NEGATIVE 11/05/2022 2210    BILIRUBINUR NEGATIVE 11/05/2022 2210   KETONESUR 5 (A) 11/05/2022 2210   PROTEINUR NEGATIVE 11/05/2022 2210   UROBILINOGEN 0.2 12/07/2009 1532   NITRITE NEGATIVE 11/05/2022 2210   LEUKOCYTESUR TRACE (A) 11/05/2022 2210   Sepsis Labs: @LABRCNTIP (procalcitonin:4,lacticidven:4) ) Recent Results (from the past 240 hour(s))  Blood Culture (routine x 2)     Status: None   Collection Time: 11/01/22  5:30 AM   Specimen: BLOOD RIGHT FOREARM  Result Value Ref Range Status   Specimen Description   Final    BLOOD RIGHT FOREARM BOTTLES DRAWN AEROBIC AND ANAEROBIC   Special Requests Blood Culture adequate volume  Final  Culture   Final    NO GROWTH 5 DAYS Performed at [facility], [address] [place], [place] [address]    Report Status 11/06/2022 FINAL  Final  Blood Culture (routine x 2)     Status: None   Collection Time: 11/01/22  5:38 AM   Specimen: BLOOD RIGHT HAND  Result Value Ref Range Status   Specimen Description   Final    BLOOD RIGHT HAND BOTTLES DRAWN AEROBIC AND ANAEROBIC   Special Requests Blood Culture adequate volume  Final   Culture   Final    NO GROWTH 5 DAYS Performed at [facility], [address], [place], [place] [address]    Report Status 11/06/2022 FINAL  Final  Blood culture (routine x 2)     Status: None (Preliminary result)   Collection Time: 11/05/22  8:23 PM   Specimen: BLOOD RIGHT FOREARM  Result Value Ref Range Status   Specimen Description BLOOD RIGHT FOREARM  Final   Special Requests   Final    BOTTLES DRAWN AEROBIC AND ANAEROBIC Blood Culture adequate volume   Culture   Final    NO GROWTH 4 DAYS Performed at [facility], [address] [place], [place] [address]    Report Status PENDING  Incomplete  Blood culture (routine x 2)     Status: None (Preliminary result)   Collection Time: 11/05/22  8:28 PM   Specimen: BLOOD RIGHT FOREARM  Result Value Ref Range Status   Specimen Description BLOOD RIGHT FOREARM  Final   Special Requests   Final     BOTTLES DRAWN AEROBIC ONLY Blood Culture adequate volume   Culture   Final    NO GROWTH 4 DAYS Performed at [facility], [address], [place], [place] [address]    Report Status PENDING  Incomplete     Scheduled Meds:  amiodarone  200 mg Oral Once per day on Mon Tue Wed Thu Fri Sat   aspirin EC  81 mg Oral Q breakfast   enoxaparin (LOVENOX) injection  40 mg Subcutaneous Q24H   folic acid  1 mg Oral Daily   isosorbide mononitrate  30 mg Oral Daily   metoprolol succinate  50 mg Oral BID   ranolazine  500 mg Oral BID   ticagrelor  60 mg Oral BID   Continuous Infusions:  Procedures/Studies: CT HEAD WO CONTRAST ( )  Result Date: 11/06/2022 CLINICAL DATA:  Neuro deficit, acute, stroke suspected. Altered mental status. EXAM: CT HEAD WITHOUT CONTRAST TECHNIQUE: Contiguous axial images were obtained from the base of the skull through the vertex without intravenous contrast. RADIATION DOSE REDUCTION: This exam was performed according to the departmental dose-optimization program which includes automated exposure control, adjustment of the mA and/or kV according to patient size and/or use of iterative reconstruction technique. COMPARISON:  Head CT 11/05/2022. FINDINGS: Brain: No acute intracranial hemorrhage. Unchanged mild chronic small-vessel disease with old lacunar infarct in the left lentiform nucleus. Gray-white differentiation is otherwise preserved. No hydrocephalus or extra-axial collection. No mass effect or midline shift. Vascular: No hyperdense vessel or unexpected calcification. Skull: No calvarial fracture or suspicious bone lesion. Skull base is unremarkable. Sinuses/Orbits: Unremarkable. Other: None. IMPRESSION: 1. No acute intracranial process. 2. Unchanged mild chronic small-vessel disease. Electronically Signed   By: Orvan Falconer M.D.   On: 11/06/2022 08:27   DG Chest Port 1 View  Result Date: 11/05/2022 CLINICAL DATA:  Weakness, coronary artery disease, COPD EXAM:  PORTABLE CHEST 1 VIEW COMPARISON:  11/01/2022 FINDINGS: The  lungs are symmetrically well expanded. No pneumothorax or pleural effusion. Mild progressive central pulmonary vascular congestion and trace diffuse interstitial pulmonary edema, likely cardiogenic in nature, are new since prior examination. Cardiac size is within normal limits. Tortuosity and ectasia of the thoracic aorta is noted, better assessed on CT examination of 11/01/2022. Left subclavian dual lead pacemaker is unchanged. IMPRESSION: 1. Developing, mild cardiogenic failure. 2. Tortuosity and ectasia of the thoracic aorta. Electronically Signed   By: Helyn Numbers M.D.   On: 11/05/2022 20:04   CT ANGIO HEAD NECK W WO CM W PERF (CODE STROKE)  Result Date: 11/05/2022 CLINICAL DATA:  Neuro deficit with acute stroke suspected. Right-sided weakness and facial droop EXAM: CT ANGIOGRAPHY HEAD AND NECK CT PERFUSION BRAIN TECHNIQUE: Multidetector CT imaging of the head and neck was performed using the standard protocol during bolus administration of intravenous contrast. Multiplanar CT image reconstructions and MIPs were obtained to evaluate the vascular anatomy. Carotid stenosis measurements (when applicable) are obtained utilizing NASCET criteria, using the distal internal carotid diameter as the denominator. Multiphase CT imaging of the brain was performed following IV bolus contrast injection. Subsequent parametric perfusion maps were calculated using RAPID software. RADIATION DOSE REDUCTION: This exam was performed according to the departmental dose-optimization program which includes automated exposure control, adjustment of the mA and/or kV according to patient size and/or use of iterative reconstruction technique. CONTRAST:  OMNIPAQUE IOHEXOL 350 MG/ML SOLN COMPARISON:  Head CT from earlier today. FINDINGS: Aortic arch: Atheromatous plaque.  Three vessel branching Right carotid system: Atheromatous plaque at the bifurcation. High-grade  stenosis is suspected although there is limited quantification due to more shin artifact. Narrowing is over 50%. Left carotid system: Atheromatous plaque at the common carotid origin and at the bifurcation without flow reducing stenosis or ulceration. Vertebral arteries: Calcified plaque at the proximal right subclavian causes 70% stenosis as measured on coronal reformats. Advanced narrowing at the right vertebral origin due to atheromatous plaque. Moderate narrowing at the left vertebral origin. No dissection or beading. Skeleton: Ordinary cervical spine degeneration Other neck: No acute finding Upper chest: Emphysema. Review of the MIP images confirms the above findings CTA HEAD FINDINGS Anterior circulation: Limited by motion. Extensive atheromatous calcification of the cavernous carotids. There may be significant narrowing at the right MCA bifurcation, although degraded by motion. No emergent large vessel occlusion Posterior circulation: Right dominant vertebral artery. Before atheromatous plaque with high-grade narrowing affecting the right more than left vertebral artery. Moderately extensive atheromatous irregularity of the basilar. Severe left P1 segment stenosis with downstream underfilling. Bilateral PCA atheromatous irregularity is extensive. Venous sinuses: Unremarkable. CT Brain Perfusion Findings: ASPECTS: 10 CBF (<30%) Volume: 0mL Perfusion (Tmax>6.0s) volume: 3mL There is notable motion artifact. IMPRESSION: 1. Limited study due to motion artifact. 2. Severe narrowing at the left P1 segment. On coronal thick MIPS there is question of luminal filling defect but given the degree of motion this may instead be atheromatous. 3. Right ICA origin stenosis that is likely flow reducing with possible downstream underfilling. 4. 70% narrowing at the right subclavian origin. 5. High-grade narrowing of the right vertebral artery at its origin and vertebrobasilar junction. Advanced narrowing of the non dominant  left V4 segment as well. 6. Moderate distal basilar stenosis. 7. Severe PCA atheromatous irregularity. 8. Noncontributory CT perfusion. Electronically Signed   By: Tiburcio Pea M.D.   On: 11/05/2022 19:26   CT Cervical Spine Wo Contrast  Result Date: 11/05/2022 CLINICAL DATA:  Trauma, multiple falls EXAM: CT CERVICAL  SPINE WITHOUT CONTRAST TECHNIQUE: Multidetector CT imaging of the cervical spine was performed without intravenous contrast. Multiplanar CT image reconstructions were also generated. RADIATION DOSE REDUCTION: This exam was performed according to the departmental dose-optimization program which includes automated exposure control, adjustment of the mA and/or kV according to patient size and/or use of iterative reconstruction technique. COMPARISON:  11/01/2022 FINDINGS: There is patient motion in some of the images limiting the study. Alignment: Alignment of posterior margins of vertebral bodies is within normal limits. Skull base and vertebrae: No recent fracture is seen. Soft tissues and spinal canal: Posterior bony spurs causing mild extrinsic pressure on the ventral margin of thecal sac from C3 to C6 levels. Disc levels: There is minimal encroachment of right neural foramen at C2-C3 level. There is mild encroachment of neural foramina from C3-C7 levels. Upper chest: Linear patchy pleural densities are seen in both apices. Other: Thyroid is enlarged with inhomogeneous attenuation. IMPRESSION: Motion limited study. No recent fracture is seen in cervical spine. Cervical spondylosis with encroachment of neural foramina at multiple levels. There is mild extrinsic pressure over the ventral margin of thecal sac caused by posterior bony spurs without significant central spinal stenosis. Thyroid is enlarged with inhomogeneous attenuation. Nonemergent thyroid sonogram may be considered. Electronically Signed   By: Ernie Avena M.D.   On: 11/05/2022 19:17   CT HEAD CODE STROKE WO  CONTRAST  Result Date: 11/05/2022 CLINICAL DATA:  Code stroke. Neuro deficit with acute stroke suspected EXAM: CT HEAD WITHOUT CONTRAST TECHNIQUE: Contiguous axial images were obtained from the base of the skull through the vertex without intravenous contrast. RADIATION DOSE REDUCTION: This exam was performed according to the departmental dose-optimization program which includes automated exposure control, adjustment of the mA and/or kV according to patient size and/or use of iterative reconstruction technique. COMPARISON:  Head CT 11/01/2022 FINDINGS: Brain: No evidence of acute infarction, hemorrhage, hydrocephalus, extra-axial collection or mass lesion/mass effect. Chronic small vessel ischemia with chronic perforator infarct at the left corona radiata. Vascular: No hyperdense vessel or unexpected calcification. Skull: Normal. Negative for fracture or focal lesion. Sinuses/Orbits: No acute finding. Other: Prelim sent in epic chat. ASPECTS [facility] Stroke Program Early CT Score) Not scored without localizing history. IMPRESSION: No acute or interval finding. Electronically Signed   By: Tiburcio Pea M.D.   On: 11/05/2022 18:32   CT CHEST ABDOMEN PELVIS W CONTRAST  Result Date: 11/01/2022 CLINICAL DATA:  Unwitnessed fall.  Blunt poly trauma. EXAM: CT CHEST, ABDOMEN, AND PELVIS WITH CONTRAST TECHNIQUE: Multidetector CT imaging of the chest, abdomen and pelvis was performed following the standard protocol during bolus administration of intravenous contrast. RADIATION DOSE REDUCTION: This exam was performed according to the departmental dose-optimization program which includes automated exposure control, adjustment of the mA and/or kV according to patient size and/or use of iterative reconstruction technique. CONTRAST:  OMNIPAQUE IOHEXOL 300 MG/ML  SOLN COMPARISON:  06/29/2022 FINDINGS: CT CHEST FINDINGS Cardiovascular: No significant vascular findings. Normal heart size. No pericardial effusion.  Dual-chamber pacer leads in unremarkable position. Atheromatous calcification of the aorta and coronaries. Mediastinum/Nodes: No hematoma or pneumomediastinum Lungs/Pleura: There is no edema, consolidation, effusion, or pneumothorax. Musculoskeletal: No acute finding CT ABDOMEN PELVIS FINDINGS Hepatobiliary: No hepatic injury or perihepatic hematoma. Gallbladder is surgically absent Pancreas: Prominent main duct size (up to 3 mm) with multiple cystic densities in the head and body, measuring up to 9 mm at the uncinate process. MRCP was recommended 05/01/2022, follow-up to depend on comorbidities. No acute finding. Spleen: Coarse  calcifications.  No visible injury Adrenals/Urinary Tract: Left renal cortical scarring. Stomach/Bowel: No evidence of injury Vascular/Lymphatic: No evidence of injury. Extensive atheromatous calcification. Reproductive: Hysterectomy. Other: No ascites or pneumoperitoneum Musculoskeletal: Generalized osteopenia. Chronic L1 inferior endplate fracture. Generalized osteopenia. No evidence of acute injury. IMPRESSION: 1. No evidence of injury to the chest or abdomen. 2. Chronic findings, including cystic pancreas lesions, are stable from previous imaging and described above. Electronically Signed   By: Tiburcio Pea M.D.   On: 11/01/2022 06:22   DG Pelvis Portable  Result Date: 11/01/2022 CLINICAL DATA:  Fall EXAM: PORTABLE PELVIS 1 VIEWS COMPARISON:  10/07/2022 FINDINGS: There is no evidence of pelvic fracture or diastasis. No pelvic bone lesions are seen. Generalized osteopenia. IMPRESSION: Negative. Electronically Signed   By: Tiburcio Pea M.D.   On: 11/01/2022 05:29   CT HEAD WO CONTRAST ( )  Result Date: 11/01/2022 CLINICAL DATA:  Head trauma, minor.  Unwitnessed fall. EXAM: CT HEAD WITHOUT CONTRAST CT CERVICAL SPINE WITHOUT CONTRAST TECHNIQUE: Multidetector CT imaging of the head and cervical spine was performed following the standard protocol without intravenous contrast.  Multiplanar CT image reconstructions of the cervical spine were also generated. RADIATION DOSE REDUCTION: This exam was performed according to the departmental dose-optimization program which includes automated exposure control, adjustment of the mA and/or kV according to patient size and/or use of iterative reconstruction technique. COMPARISON:  Five days ago FINDINGS: CT HEAD FINDINGS Brain: No evidence of acute infarction, hemorrhage, hydrocephalus, extra-axial collection or mass lesion/mass effect. Chronic small vessel ischemia in the cerebral white matter with chronic perforator infarct at the left basal ganglia. Vascular: No hyperdense vessel or unexpected calcification. Skull: Normal. Negative for fracture or focal lesion. Sinuses/Orbits: No evidence of injury. CT CERVICAL SPINE FINDINGS Alignment: Normal. Skull base and vertebrae: No acute fracture. No primary bone lesion or focal pathologic process. Soft tissues and spinal canal: No prevertebral fluid or swelling. No visible canal hematoma. Disc levels:  Ordinary and generalized cervical spine degeneration Upper chest: Biapical scarring with no evidence of injury. IMPRESSION: No evidence of acute intracranial or cervical spine injury. Electronically Signed   By: Tiburcio Pea M.D.   On: 11/01/2022 05:28   CT CERVICAL SPINE WO CONTRAST  Result Date: 11/01/2022 CLINICAL DATA:  Head trauma, minor.  Unwitnessed fall. EXAM: CT HEAD WITHOUT CONTRAST CT CERVICAL SPINE WITHOUT CONTRAST TECHNIQUE: Multidetector CT imaging of the head and cervical spine was performed following the standard protocol without intravenous contrast. Multiplanar CT image reconstructions of the cervical spine were also generated. RADIATION DOSE REDUCTION: This exam was performed according to the departmental dose-optimization program which includes automated exposure control, adjustment of the mA and/or kV according to patient size and/or use of iterative reconstruction technique.  COMPARISON:  Five days ago FINDINGS: CT HEAD FINDINGS Brain: No evidence of acute infarction, hemorrhage, hydrocephalus, extra-axial collection or mass lesion/mass effect. Chronic small vessel ischemia in the cerebral white matter with chronic perforator infarct at the left basal ganglia. Vascular: No hyperdense vessel or unexpected calcification. Skull: Normal. Negative for fracture or focal lesion. Sinuses/Orbits: No evidence of injury. CT CERVICAL SPINE FINDINGS Alignment: Normal. Skull base and vertebrae: No acute fracture. No primary bone lesion or focal pathologic process. Soft tissues and spinal canal: No prevertebral fluid or swelling. No visible canal hematoma. Disc levels:  Ordinary and generalized cervical spine degeneration Upper chest: Biapical scarring with no evidence of injury. IMPRESSION: No evidence of acute intracranial or cervical spine injury. Electronically Signed   By: Christiane Ha  Watts M.D.   On: 11/01/2022 05:28   DG Chest Port 1 View  Result Date: 11/01/2022 CLINICAL DATA:  87 year old female with possible sepsis. EXAM: PORTABLE CHEST 1 VIEW COMPARISON:  Chest x-ray 10/27/2022. FINDINGS: Lung volumes are very low. Low opacity at the left base favored to reflect small left pleural effusion with associated passive subsegmental atelectasis in the left lower lobe. No definite consolidative airspace disease. No right pleural effusion. No pneumothorax. No evidence of pulmonary edema. Heart size is normal. The patient is severely rotated to the left on today's exam, resulting in distortion of the mediastinal contours and reduced diagnostic sensitivity and specificity for mediastinal pathology. Atherosclerotic calcifications in the thoracic aorta. Left-sided pacemaker device in place with lead tips projecting over the expected location of the right atrium and right ventricle. Soft tissue anchor in the right humeral head incidentally noted. IMPRESSION: 1. Low lung volumes with small left pleural  effusion and probable subsegmental atelectasis in the left lung base. 2. Aortic atherosclerosis. Electronically Signed   By: Trudie Reedaniel  Entrikin M.D.   On: 11/01/2022 05:26   DG Chest 2 View  Result Date: 10/27/2022 CLINICAL DATA:  Altered mental status EXAM: CHEST - 2 VIEW COMPARISON:  06/29/2022 FINDINGS: Left-sided pacing device as before. Cardiomegaly. Heterogeneous ground-glass opacities in the right upper lobe and left mid to lower lung. Aortic atherosclerosis. No pneumothorax. IMPRESSION: Heterogeneous ground-glass opacities in the right upper lobe and left mid to lower lung, suspect for multifocal pneumonia. Cardiomegaly. Electronically Signed   By: Jasmine PangKim  Fujinaga M.D.   On: 10/27/2022 21:58   CT Head Wo Contrast  Result Date: 10/27/2022 CLINICAL DATA:  Altered mental status EXAM: CT HEAD WITHOUT CONTRAST TECHNIQUE: Contiguous axial images were obtained from the base of the skull through the vertex without intravenous contrast. RADIATION DOSE REDUCTION: This exam was performed according to the departmental dose-optimization program which includes automated exposure control, adjustment of the mA and/or kV according to patient size and/or use of iterative reconstruction technique. COMPARISON:  CT brain 12/11/2021 FINDINGS: Brain: No acute territorial infarction, hemorrhage or intracranial mass. Probable chronic lacunar infarct left basal ganglia. Patchy white matter hypodensity consistent with chronic small vessel ischemic change. Mild atrophy. Nonenlarged ventricles Vascular: No hyperdense vessels.  Carotid vascular calcification Skull: Normal. Negative for fracture or focal lesion. Sinuses/Orbits: No acute finding. Other: None IMPRESSION: 1. No CT evidence for acute intracranial abnormality. 2. Atrophy and chronic small vessel ischemic changes of the white matter. Electronically Signed   By: Jasmine PangKim  Fujinaga M.D.   On: 10/27/2022 21:32   CUP PACEART REMOTE DEVICE CHECK  Result Date: 10/23/2022 Scheduled  remote reviewed. Normal device function.  Next remote 91 days. [place]   Catarina Hartshornavid Akiko Schexnider, DO  [facility]  If 7PM-7AM, please contact night-coverage [email or web address] Password [email or web address] 11/10/2022, 1:36 PM   LOS: 4 days

## 2022-11-10 NOTE — Patient Outreach (Signed)
  Care Coordination   Follow Up Visit Note   03/13/2023 Late update entry for 11/10/22 Name: [name] MRN: [id number] DOB: July 11, 1936  [name] is a 87 y.o. year old female who sees Margo Aye, Kathleene Hazel, MD for primary care. I spoke with April, granddaughter of [name] by phone today.  What matters to the patients health and wellness today?  Inpatient admission since 11/05/22 Facility placement pending Concerns with "ery feelings" in the patient's home discussed with April Sleeping poorly prior to and during this inpatient stay per April Answered questions about who can assist with changing to full medicaid    Goals Addressed             This Visit's Progress    [facility] care coordination services   On track    Interventions Today    Flowsheet Row Most Recent Value  Chronic Disease   Chronic disease during today's visit Other  [hospital admission, pending facility placement, home safety, medicaid active with [facility]]  General Interventions   General Interventions Discussed/Reviewed General Interventions Reviewed, [employer], Level of Care  Level of Care Skilled Nursing Facility, Public librarian Medicaid  Education Interventions   Education Provided Provided Education  [facility placement from hospital, medicaid application process, resources/assist]  Provided Verbal Education On [employer], Production assistant, radio  Mental Health Interventions   Mental Health Discussed/Reviewed Mental Health Reviewed, Coping Strategies  Safety Interventions   Safety Discussed/Reviewed Safety Reviewed              SDOH assessments and interventions completed:  No     Care Coordination Interventions:  Yes, provided   Follow up plan:  Pending hospital discharge, social work interventions    Encounter Outcome:  Pt. Visit Completed    L. Noelle Penner, RN, BSN, CCM [facility] Care Management Community Coordinator Office number [phone]

## 2022-11-10 NOTE — Patient Outreach (Signed)
  Care Coordination   11/10/2022 Name: [name] MRN: [id number] DOB: [birth year]   Care Coordination Outreach Attempts:  An unsuccessful telephone outreach was attempted today to offer the patient information about available care coordination services as a benefit of their health plan.   Follow Up Plan:  Additional outreach attempts will be made to offer the patient care coordination information and services.   Encounter Outcome:  No Answer   Care Coordination Interventions:  No, not indicated    Gracelynn Bircher L. Noelle Penner, RN, BSN, CCM [facility] Care Management Community Coordinator Office number [phone]

## 2022-11-11 ENCOUNTER — Encounter: Payer: Self-pay | Admitting: *Deleted

## 2022-11-11 ENCOUNTER — Ambulatory Visit: Payer: Self-pay | Admitting: *Deleted

## 2022-11-11 DIAGNOSIS — J9611 Chronic respiratory failure with hypoxia: Secondary | ICD-10-CM | POA: Diagnosis not present

## 2022-11-11 DIAGNOSIS — I495 Sick sinus syndrome: Secondary | ICD-10-CM | POA: Diagnosis not present

## 2022-11-11 DIAGNOSIS — G9341 Metabolic encephalopathy: Secondary | ICD-10-CM | POA: Diagnosis not present

## 2022-11-11 DIAGNOSIS — I5032 Chronic diastolic (congestive) heart failure: Secondary | ICD-10-CM | POA: Diagnosis not present

## 2022-11-11 MED ORDER — FOLIC ACID 1 MG PO TABS: 1.0000 mg | ORAL_TABLET | Freq: Every day | ORAL | Status: AC
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
## 2022-11-11 NOTE — Progress Notes (Addendum)
Physical Therapy Treatment Patient Details Name: [name] MRN: [id number] DOB: Mar 19, 1936 Today's Date: 11/11/2022   History of Present Illness [name] is an 87 y.o. female with medical history significant of chronic respiratory failure on supplemental oxygen at 2 LPM at night, COPD, atrial fibrillation, CHF, suspected pancreatic cancer, tachycardia-bradycardia syndrome s/p pacemaker, CAD who presented to the emergency department due to strokelike symptoms.  Patient was unable to provide history, history was obtained from EDP and ED medical record.  Per report, granddaughter reported that patient last well known time was 8:30PM, she was noted to be confused when seen today around 10 AM by granddaughter.  Patient was reported to have had multiple falls at home and was noted with right-sided weakness.  She sustained another fall while trying to pick up a call from her Dr. this morning.  EMS was initially activated, but patient refused to go to the hospital, EMS was reactivated later in the day by granddaughter due to concern for patient's right-sided weakness and right-sided facial droop with difficulty in being able to follow commands.  Patient was recently admitted from 3/25 to 10/30/2022 due to community acquired pneumonia, primary delusional and hypertensive urgency.    PT Comments    Pt willing to participate with therapy, A&O x3 though somewhat difficult to understand speech.  Pt mod I with bed mobility, slow labored movements with use of handrails.  Cueing for proper hand placement for safe mechanics during STS and cueing for posture and to stand within walker during gait training, no LOB but slow cadence.  EOS pt left in chair with chair alarm set in reclined position, call bell within reach, no reports of pain was limited by fatigue.     Recommendations for follow up therapy are one component of a multi-disciplinary discharge planning process, led by the attending physician.   Recommendations may be updated based on patient status, additional functional criteria and insurance authorization.  Follow Up Recommendations  Can patient physically be transported by private vehicle: Yes    Assistance Recommended at Discharge Frequent or constant Supervision/Assistance  Patient can return home with the following A little help with walking and/or transfers;A little help with bathing/dressing/bathroom;Help with stairs or ramp for entrance;Assistance with cooking/housework   Equipment Recommendations  None recommended by PT    Recommendations for Other Services Rehab consult     Precautions / Restrictions Precautions Precautions: Fall Restrictions Weight Bearing Restrictions: No     Mobility  Bed Mobility Overal bed mobility: Modified Independent Bed Mobility: Supine to Sit     Supine to sit: Min guard     General bed mobility comments: increased time, labored movement    Transfers Overall transfer level: Needs assistance Equipment used: Rolling walker (2 wheels) Transfers: Sit to/from Stand Sit to Stand: Min guard           General transfer comment: cueing for hand placement for safe STS    Ambulation/Gait Ambulation/Gait assistance: Min guard, Min assist Gait Distance (Feet): 45 Feet Assistive device: Rolling walker (2 wheels) Gait Pattern/deviations: Decreased step length - left, Decreased stance time - right, Decreased stride length, Trunk flexed Gait velocity: decreased     General Gait Details: slow labored cadence wtihout loss of balance, limited mostly due to fatigue, cueing for posture   Stairs             Wheelchair Mobility    Modified Rankin (Stroke Patients Only)       Balance  Cognition Arousal/Alertness: Awake/alert Behavior During Therapy: WFL for tasks assessed/performed Overall Cognitive Status: No family/caregiver present to determine baseline  cognitive functioning                                 General Comments: Hard of hearing (good ear on left side)        Exercises      General Comments        Pertinent Vitals/Pain Pain Assessment Pain Assessment: No/denies pain    Home Living                          Prior Function            PT Goals (current goals can now be found in the care plan section)      Frequency    Min 3X/week      PT Plan Current plan remains appropriate    Co-evaluation              AM-PAC PT "6 Clicks" Mobility   Outcome Measure  Help needed turning from your back to your side while in a flat bed without using bedrails?: A Little Help needed moving from lying on your back to sitting on the side of a flat bed without using bedrails?: A Little Help needed moving to and from a bed to a chair (including a wheelchair)?: A Little Help needed standing up from a chair using your arms (e.g., wheelchair or bedside chair)?: A Little Help needed to walk in hospital room?: A Lot Help needed climbing 3-5 steps with a railing? : A Lot 6 Click Score: 16    End of Session Equipment Utilized During Treatment: Gait belt Activity Tolerance: Patient tolerated treatment well;Patient limited by fatigue Patient left: in chair;with call bell/phone within reach;with chair alarm set;Other (comment) (reclined in chair) Nurse Communication: Mobility status PT Visit Diagnosis: Unsteadiness on feet (R26.81);Other abnormalities of gait and mobility (R26.89);Muscle weakness (generalized) (M62.81)     Time: 0912-0930 PT Time Calculation (min) (ACUTE ONLY): 18 min  Charges:  $Therapeutic Activity: 8-22 mins                    Becky Sax, LPTA/CLT; CBIS [phone]  Juel Burrow 11/11/2022, 3:32 PM

## 2022-11-11 NOTE — Progress Notes (Signed)
Mobility Specialist Progress Note:    11/11/22 1000  Mobility  Activity Ambulated with assistance in hallway  Level of Assistance Contact guard assist, steadying assist  Assistive Device Front wheel walker  Distance Ambulated (ft) 80 ft  Activity Response Tolerated well  Mobility Referral Yes  $Mobility charge 1 Mobility   Pt agreeable to mobility session. Tolerated well, asx throughout. Required CGA for safety during ambulation. Returned pt to chair in room, alarm on, all needs met.   Feliciana Rossetti Mobility Specialist Please contact via Special educational needs teacher or  Rehab office at [phone]
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
## 2022-11-11 NOTE — Discharge Summary (Signed)
Physician Discharge Summary   Patient: [name] MRN: [id number] DOB: 07/26/1936  Admit date:     11/05/2022  Discharge date: 11/12/2022  Discharge Physician: [name]   PCP: Katrina Stabile, MD   Recommendations at discharge:   Please follow up with primary care provider within 1-2 weeks  Please repeat BMP and CBC in one week     Hospital Course: 87 year old female with a history of chronic respiratory failure on 2 L, atrial fibrillation, IPMN, tachybradycardia syndrome status post PPM, coronary artery disease, hyperlipidemia presenting with confusion and right-sided weakness.  The patient was not able to provide any significant history secondary to her confusion.  History is obtained from review the medical record and speaking with the patient's family.  Notably, the patient was last noted to be normal when she spoke on the phone with her granddaughter 8:30 PM on 11/04/2022.  The patient's granddaughter checked on her at 60 AM on 11/05/2022 and found the patient to be sitting in the chair with her Bible mumbling.  The patient received a call from a doctor's office and while getting up to take the call, the patient had a mechanical fall.  Apparently the patient has fallen 3-4 times in the last 24 hours.  EMS was activated, but the patient refused to come to the hospital.  Later on the day, the patient had an additional fall, and there was concern with the patient having some right-sided weakness and trouble speaking.  EMS was called and code stroke was activated. Notably, the patient was recently admitted to the hospital from 10/27/2022 to 10/30/2022.  The patient was treated for pneumonia with ceftriaxone and doxycycline.  Apparently the patient was having paranoid delusions at that time.  She was evaluated by behavioral health and was not found to have any indication for inpatient psychiatric admission.  She was recommended to go to SNF after PT evaluation, but patient refused and family subsequently  took the patient home. In speaking with the patient's granddaughter, it appears that Ms. [name] is the patient's main advocate and caretaker. [name] states that the patient has not been taking her medications properly.  The patient frequently forgets to take her medications.  In the last 6 months, the patient has had a cognitive decline with increasing paranoid behavior.  In addition, the patient the patient has also had some impulsive behavior.  Patient has a history of mixed type IPMN.  She was initially seen on 12/03/2021 by [name], and she was found  to have a growing pancreatic cystic lesion with associated abdominal pain.  Due to these she was referred for endoscopic ultrasound.  Underwent endoscopic ultrasound with Dr. Meridee [name] on 02/24/2022 which found Anechoic lesions suggestive of three cysts were identified in the pancreatic head.  Cytology showed an acellular specimen.  The case was ultimately discussed at multidisciplinary tumor board.   They considered that she was presenting changes concerning for a mixed branch duct/main duct IPMN.  It was considered that given her medical comorbidities she would not be the best surgical candidate but she will continue monitoring. They  decided that she should have a repeat CT pancreas protocol in 6 months.  Depending on the findings on imaging, may need to discuss again the benefits versus risk of undergoing surgical resection.  patient underwent a CT of the abdomen with and without IV contrast on 05/01/2022 which showed decrease size of the cystic lesions of the pancreatic head and uncinate process measuring up to  15 mm.  She was advised to have a repeat CT pancreas protocol in 6 months.  She was last seen in the GI office on 09/25/2022 at which time there were plans for repeat imaging later this year.  Throughout the hospitalization, the patient remained alert and oriented, although not fully to time.  She would intermittently become agitated.   She remained intermittently confused and have episodes of paranoia.  Clearly, the patient continued to not exhibit capacity to make decisions regarding her own person, property, or healthcare.  She continued to lack the insight to understand the consequences of her decisions.  Unfortunately, the patient's hospitalization was prolonged secondary to the requirement for the patient's granddaughter to obtain guardianship.  This guardianship was required to facilitate the patient's transition to short-term rehab as the patient continued to refuse, but did not have the insight to make her own medical decisions.  Fortunately, the patient remained medically stable.  Guardianship was ultimately obtained on 11/11/2022.  Her discharge was delayed until 11/12/2022 secondary to requirement of having 24 hours without a sitter.  Assessment and Plan: Acute metabolic encephalopathy -Multifactorial including electrolyte disturbance, dehydration, and progression of underlying cognitive impairment -Serum [id number] -TSH 1.218 -UA negative for pyuria -4/3 CT brain negative for acute findings -4/4 repeat CT brain--neg -CTA head and neck negative for LVO -EEG--deferred due to pt refusal and agitation -Check VBG 7.43/49/<31/40 -Ammonia 19 -RPR--neg -Patient remains confused with intermittent agitation -this is likely the patient's baseline representing progression of underlying cognitive impairment -It does not appear that the patient currently has capacity to make her own decisions.  She has poor insight regarding her medical condition -based upon my evaluation of patient and discussion with family>> Patient is not longer able to make rational decisions regarding her own healthcare and well being.  She does not exhibit understanding of the consequences of her decisions regarding her healthcare and well being.  She continues to have impulsive behavior and is not able to make rational decisions regarding her person, finances  or personal property.  Family states that patient continues to have gradual cognitive decline which has impaired the patient's ability to manage her finances and ability to care for herself  and perform appropriate activities of daily living.   Hypokalemia -Repleted -Magnesium 2.0   Syncope -pt had syncopal episode 4/4 -felt to be vasovagal -telemetry showed atrial paced with intermittent afib -appreciate cardiology consult -no further episodes during the hospitalization   Frequent falls/failure to thrive -PT evaluation>>plan for SNF -Workup as discussed above   Persistent atrial fibrillation -Rate controlled -Continue amiodarone -Patient has declined anticoagulation in the past   Chronic respiratory failure with hypoxia -She is on 2 L at home -Stable on RA throughout the hospitalization with saturation 99-99% on RA   Coronary artery disease -No chest pain presently -Reviewed Dr. Lubertha Basque 07/15/22 note--patient has been on Brilinta in the past>> stopped temporarily for generator exchange on her pacemaker -Reviewed Dr. Ival Bible 06/16/2022 note--Brilinta reduced to 60 mg twice daily -Continue Ranexa, imdur, and metoprolol succinate   Sinus node dysfunction -Status post PPM -Generator exchanged 07/15/2022 -Follow-up Dr. Sharrell Ku   Essential hypertension -Continue metoprolol succinate   Chronic diastolic CHF -Clinically euvolemic -Echo from 06/30/2022 with EF of 55 to 60%, no regional wall motion abnormalities    IPMN -As discussed above in the history -Outpatient follow-up with GI -CA 19-9 was noted to be normal at 14 back in February 2024.  -She will need repeat CT with pancreatic protocol as  an outpatient   Anxiety -Patient is on chronic alprazolam -PDMP reviewed--alprazolam 1 mg, #90, last refill 10/04/22   COPD -Continue as needed bronchodilators -Stable on RA         Consultants: cardiology Procedures performed: none  Disposition: Skilled nursing  facility Diet recommendation:  Cardiac diet dys 3 with thin liquids DISCHARGE MEDICATION: Allergies as of 11/11/2022       Reactions   Amitriptyline Hcl Other (See Comments)   Caused "jaws to twist and lock"   Plavix [clopidogrel Bisulfate] Hives   Sulfonamide Derivatives Other (See Comments)   UNKNOWN REACTION        Medication List     STOP taking these medications    amLODipine 5 MG tablet Commonly known as: NORVASC   furosemide 40 MG tablet Commonly known as: LASIX   potassium chloride 10 MEQ tablet Commonly known as: KLOR-CON   primidone 50 MG tablet Commonly known as: MYSOLINE       TAKE these medications    ALPRAZolam 1 MG tablet Commonly known as: XANAX Take 1 mg by mouth 3 (three) times daily as needed for anxiety.   amiodarone 200 MG tablet Commonly known as: PACERONE TAKE 1 TABLET ONCE DAILY ON MONDAY THROUGH SATURDAY, NONE ON SUNDAY. What changed: See the new instructions.   aspirin EC 81 MG tablet Take 1 tablet (81 mg total) by mouth daily with breakfast. Swallow whole.   folic acid 1 MG tablet Commonly known as: FOLVITE Take 1 tablet (1 mg total) by mouth daily. Start taking on: Katrina 10, 2024   isosorbide mononitrate 30 MG 24 hr tablet Commonly known as: IMDUR Take 1 tablet (30 mg total) by mouth daily.   Jardiance 25 MG Tabs tablet Generic drug: empagliflozin Take 25 mg by mouth daily.   metoprolol succinate 50 MG 24 hr tablet Commonly known as: TOPROL-XL Take 1 tablet (50 mg total) by mouth 2 (two) times daily. Take with or immediately following a meal.   multivitamin with minerals Tabs tablet Take 1 tablet by mouth daily.   nitroGLYCERIN 0.4 MG SL tablet Commonly known as: NITROSTAT Place 1 tablet (0.4 mg total) under the tongue every 5 (five) minutes x 3 doses as needed for chest pain.   ondansetron 4 MG tablet Commonly known as: ZOFRAN Take 4 mg by mouth every 6 (six) hours.   OXYGEN Inhale 2 L into the lungs as needed  (PRN as needed at night).   ProAir HFA 108 (90 Base) MCG/ACT inhaler Generic drug: albuterol Inhale 1 puff into the lungs every 4 (four) hours as needed for wheezing or shortness of breath.   albuterol (2.5 MG/3ML) 0.083% nebulizer solution Commonly known as: PROVENTIL Take 3 mLs (2.5 mg total) by nebulization every 4 (four) hours as needed for wheezing or shortness of breath.   ranolazine 500 MG 12 hr tablet Commonly known as: RANEXA Take 1 tablet (500 mg total) by mouth 2 (two) times daily.   rosuvastatin 40 MG tablet Commonly known as: Crestor Take 1 tablet (40 mg total) by mouth daily.   senna-docusate 8.6-50 MG tablet Commonly known as: Senokot-S Take 1 tablet by mouth at bedtime.   ticagrelor 60 MG Tabs tablet Commonly known as: BRILINTA Take 60 mg by mouth 2 (two) times daily.   triamcinolone cream 0.1 % Commonly known as: KENALOG Apply 1 Application topically daily as needed (for rash/skin irritation).   Vitamin B-12 5000 MCG Subl Take 5,000 mcg by mouth daily.   vitamin E 180 MG (  400 UNITS) capsule Generic drug: vitamin E Take 400 Units by mouth daily.        Contact information for follow-up providers     Jonelle Sidle, MD Follow up on 12/22/2022.   Specialty: Cardiology Why: Keep scheduled Cardiology follow-up for 12/22/2022 at 1:40 PM. Contact information: [address] [place] [address] [phone]              Contact information for after-discharge care     Destination     [facility] Preferred SNF .   Service: Skilled Nursing Contact information: [address] [place] [address] [phone]                    Discharge Exam: Ceasar Mons Weights   11/05/22 1845 11/05/22 2329  Weight: 62.1 kg 64.6 kg   HEENT:  [place]/AT, No thrush, no icterus CV:  IRRR, no rub, no S3, no S4 Lung:  CTA, no wheeze, no rhonchi Abd:  soft/+BS, NT Ext:  No edema, no  lymphangitis, no synovitis, no rash   Condition at discharge: stable  The results of significant diagnostics from this hospitalization (including imaging, microbiology, ancillary and laboratory) are listed below for reference.   Imaging Studies: CT HEAD WO CONTRAST ( )  Result Date: 11/06/2022 CLINICAL DATA:  Neuro deficit, acute, stroke suspected. Altered mental status. EXAM: CT HEAD WITHOUT CONTRAST TECHNIQUE: Contiguous axial images were obtained from the base of the skull through the vertex without intravenous contrast. RADIATION DOSE REDUCTION: This exam was performed according to the departmental dose-optimization program which includes automated exposure control, adjustment of the mA and/or kV according to patient size and/or use of iterative reconstruction technique. COMPARISON:  Head CT 11/05/2022. FINDINGS: Brain: No acute intracranial hemorrhage. Unchanged mild chronic small-vessel disease with old lacunar infarct in the left lentiform nucleus. Gray-white differentiation is otherwise preserved. No hydrocephalus or extra-axial collection. No mass effect or midline shift. Vascular: No hyperdense vessel or unexpected calcification. Skull: No calvarial fracture or suspicious bone lesion. Skull base is unremarkable. Sinuses/Orbits: Unremarkable. Other: None. IMPRESSION: 1. No acute intracranial process. 2. Unchanged mild chronic small-vessel disease. Electronically Signed   By: Orvan Falconer M.D.   On: 11/06/2022 08:27   DG Chest Port 1 View  Result Date: 11/05/2022 CLINICAL DATA:  Weakness, coronary artery disease, COPD EXAM: PORTABLE CHEST 1 VIEW COMPARISON:  11/01/2022 FINDINGS: The lungs are symmetrically well expanded. No pneumothorax or pleural effusion. Mild progressive central pulmonary vascular congestion and trace diffuse interstitial pulmonary edema, likely cardiogenic in nature, are new since prior examination. Cardiac size is within normal limits. Tortuosity and ectasia of the  thoracic aorta is noted, better assessed on CT examination of 11/01/2022. Left subclavian dual lead pacemaker is unchanged. IMPRESSION: 1. Developing, mild cardiogenic failure. 2. Tortuosity and ectasia of the thoracic aorta. Electronically Signed   By: Helyn Numbers M.D.   On: 11/05/2022 20:04   CT ANGIO HEAD NECK W WO CM W PERF (CODE STROKE)  Result Date: 11/05/2022 CLINICAL DATA:  Neuro deficit with acute stroke suspected. Right-sided weakness and facial droop EXAM: CT ANGIOGRAPHY HEAD AND NECK CT PERFUSION BRAIN TECHNIQUE: Multidetector CT imaging of the head and neck was performed using the standard protocol during bolus administration of intravenous contrast. Multiplanar CT image reconstructions and MIPs were obtained to evaluate the vascular anatomy. Carotid stenosis measurements (when applicable) are obtained utilizing NASCET criteria, using the distal internal carotid diameter as the denominator. Multiphase CT  imaging of the brain was performed following IV bolus contrast injection. Subsequent parametric perfusion maps were calculated using RAPID software. RADIATION DOSE REDUCTION: This exam was performed according to the departmental dose-optimization program which includes automated exposure control, adjustment of the mA and/or kV according to patient size and/or use of iterative reconstruction technique. CONTRAST:  OMNIPAQUE IOHEXOL 350 MG/ML SOLN COMPARISON:  Head CT from earlier today. FINDINGS: Aortic arch: Atheromatous plaque.  Three vessel branching Right carotid system: Atheromatous plaque at the bifurcation. High-grade stenosis is suspected although there is limited quantification due to more shin artifact. Narrowing is over 50%. Left carotid system: Atheromatous plaque at the common carotid origin and at the bifurcation without flow reducing stenosis or ulceration. Vertebral arteries: Calcified plaque at the proximal right subclavian causes 70% stenosis as measured on coronal reformats.  Advanced narrowing at the right vertebral origin due to atheromatous plaque. Moderate narrowing at the left vertebral origin. No dissection or beading. Skeleton: Ordinary cervical spine degeneration Other neck: No acute finding Upper chest: Emphysema. Review of the MIP images confirms the above findings CTA HEAD FINDINGS Anterior circulation: Limited by motion. Extensive atheromatous calcification of the cavernous carotids. There may be significant narrowing at the right MCA bifurcation, although degraded by motion. No emergent large vessel occlusion Posterior circulation: Right dominant vertebral artery. Before atheromatous plaque with high-grade narrowing affecting the right more than left vertebral artery. Moderately extensive atheromatous irregularity of the basilar. Severe left P1 segment stenosis with downstream underfilling. Bilateral PCA atheromatous irregularity is extensive. Venous sinuses: Unremarkable. CT Brain Perfusion Findings: ASPECTS: 10 CBF (<30%) Volume: 0mL Perfusion (Tmax>6.0s) volume: 3mL There is notable motion artifact. IMPRESSION: 1. Limited study due to motion artifact. 2. Severe narrowing at the left P1 segment. On coronal thick MIPS there is question of luminal filling defect but given the degree of motion this may instead be atheromatous. 3. Right ICA origin stenosis that is likely flow reducing with possible downstream underfilling. 4. 70% narrowing at the right subclavian origin. 5. High-grade narrowing of the right vertebral artery at its origin and vertebrobasilar junction. Advanced narrowing of the non dominant left V4 segment as well. 6. Moderate distal basilar stenosis. 7. Severe PCA atheromatous irregularity. 8. Noncontributory CT perfusion. Electronically Signed   By: Tiburcio Pea M.D.   On: 11/05/2022 19:26   CT Cervical Spine Wo Contrast  Result Date: 11/05/2022 CLINICAL DATA:  Trauma, multiple falls EXAM: CT CERVICAL SPINE WITHOUT CONTRAST TECHNIQUE: Multidetector CT  imaging of the cervical spine was performed without intravenous contrast. Multiplanar CT image reconstructions were also generated. RADIATION DOSE REDUCTION: This exam was performed according to the departmental dose-optimization program which includes automated exposure control, adjustment of the mA and/or kV according to patient size and/or use of iterative reconstruction technique. COMPARISON:  11/01/2022 FINDINGS: There is patient motion in some of the images limiting the study. Alignment: Alignment of posterior margins of vertebral bodies is within normal limits. Skull base and vertebrae: No recent fracture is seen. Soft tissues and spinal canal: Posterior bony spurs causing mild extrinsic pressure on the ventral margin of thecal sac from C3 to C6 levels. Disc levels: There is minimal encroachment of right neural foramen at C2-C3 level. There is mild encroachment of neural foramina from C3-C7 levels. Upper chest: Linear patchy pleural densities are seen in both apices. Other: Thyroid is enlarged with inhomogeneous attenuation. IMPRESSION: Motion limited study. No recent fracture is seen in cervical spine. Cervical spondylosis with encroachment of neural foramina at multiple levels. There is  mild extrinsic pressure over the ventral margin of thecal sac caused by posterior bony spurs without significant central spinal stenosis. Thyroid is enlarged with inhomogeneous attenuation. Nonemergent thyroid sonogram may be considered. Electronically Signed   By: Ernie Avena M.D.   On: 11/05/2022 19:17   CT HEAD CODE STROKE WO CONTRAST  Result Date: 11/05/2022 CLINICAL DATA:  Code stroke. Neuro deficit with acute stroke suspected EXAM: CT HEAD WITHOUT CONTRAST TECHNIQUE: Contiguous axial images were obtained from the base of the skull through the vertex without intravenous contrast. RADIATION DOSE REDUCTION: This exam was performed according to the departmental dose-optimization program which includes automated  exposure control, adjustment of the mA and/or kV according to patient size and/or use of iterative reconstruction technique. COMPARISON:  Head CT 11/01/2022 FINDINGS: Brain: No evidence of acute infarction, hemorrhage, hydrocephalus, extra-axial collection or mass lesion/mass effect. Chronic small vessel ischemia with chronic perforator infarct at the left corona radiata. Vascular: No hyperdense vessel or unexpected calcification. Skull: Normal. Negative for fracture or focal lesion. Sinuses/Orbits: No acute finding. Other: Prelim sent in epic chat. ASPECTS [facility] Stroke Program Early CT [name]) Not scored without localizing history. IMPRESSION: No acute or interval finding. Electronically Signed   By: Tiburcio Pea M.D.   On: 11/05/2022 18:32   CT CHEST ABDOMEN PELVIS W CONTRAST  Result Date: 11/01/2022 CLINICAL DATA:  Unwitnessed fall.  Blunt poly trauma. EXAM: CT CHEST, ABDOMEN, AND PELVIS WITH CONTRAST TECHNIQUE: Multidetector CT imaging of the chest, abdomen and pelvis was performed following the standard protocol during bolus administration of intravenous contrast. RADIATION DOSE REDUCTION: This exam was performed according to the departmental dose-optimization program which includes automated exposure control, adjustment of the mA and/or kV according to patient size and/or use of iterative reconstruction technique. CONTRAST:  OMNIPAQUE IOHEXOL 300 MG/ML  SOLN COMPARISON:  06/29/2022 FINDINGS: CT CHEST FINDINGS Cardiovascular: No significant vascular findings. Normal heart size. No pericardial effusion. Dual-chamber pacer leads in unremarkable position. Atheromatous calcification of the aorta and coronaries. Mediastinum/Nodes: No hematoma or pneumomediastinum Lungs/Pleura: There is no edema, consolidation, effusion, or pneumothorax. Musculoskeletal: No acute finding CT ABDOMEN PELVIS FINDINGS Hepatobiliary: No hepatic injury or perihepatic hematoma. Gallbladder is surgically absent Pancreas:  Prominent main duct size (up to 3 mm) with multiple cystic densities in the head and body, measuring up to 9 mm at the uncinate process. MRCP was recommended 05/01/2022, follow-up to depend on comorbidities. No acute finding. Spleen: Coarse calcifications.  No visible injury Adrenals/Urinary Tract: Left renal cortical scarring. Stomach/Bowel: No evidence of injury Vascular/Lymphatic: No evidence of injury. Extensive atheromatous calcification. Reproductive: Hysterectomy. Other: No ascites or pneumoperitoneum Musculoskeletal: Generalized osteopenia. Chronic L1 inferior endplate fracture. Generalized osteopenia. No evidence of acute injury. IMPRESSION: 1. No evidence of injury to the chest or abdomen. 2. Chronic findings, including cystic pancreas lesions, are stable from previous imaging and described above. Electronically Signed   By: Tiburcio Pea M.D.   On: 11/01/2022 06:22   DG Pelvis Portable  Result Date: 11/01/2022 CLINICAL DATA:  Fall EXAM: PORTABLE PELVIS 1 VIEWS COMPARISON:  10/07/2022 FINDINGS: There is no evidence of pelvic fracture or diastasis. No pelvic bone lesions are seen. Generalized osteopenia. IMPRESSION: Negative. Electronically Signed   By: Tiburcio Pea M.D.   On: 11/01/2022 05:29   CT HEAD WO CONTRAST ( )  Result Date: 11/01/2022 CLINICAL DATA:  Head trauma, minor.  Unwitnessed fall. EXAM: CT HEAD WITHOUT CONTRAST CT CERVICAL SPINE WITHOUT CONTRAST TECHNIQUE: Multidetector CT imaging of the head and cervical spine was performed  following the standard protocol without intravenous contrast. Multiplanar CT image reconstructions of the cervical spine were also generated. RADIATION DOSE REDUCTION: This exam was performed according to the departmental dose-optimization program which includes automated exposure control, adjustment of the mA and/or kV according to patient size and/or use of iterative reconstruction technique. COMPARISON:  Five days ago FINDINGS: CT HEAD FINDINGS Brain:  No evidence of acute infarction, hemorrhage, hydrocephalus, extra-axial collection or mass lesion/mass effect. Chronic small vessel ischemia in the cerebral white matter with chronic perforator infarct at the left basal ganglia. Vascular: No hyperdense vessel or unexpected calcification. Skull: Normal. Negative for fracture or focal lesion. Sinuses/Orbits: No evidence of injury. CT CERVICAL SPINE FINDINGS Alignment: Normal. Skull base and vertebrae: No acute fracture. No primary bone lesion or focal pathologic process. Soft tissues and spinal canal: No prevertebral fluid or swelling. No visible canal hematoma. Disc levels:  Ordinary and generalized cervical spine degeneration Upper chest: Biapical scarring with no evidence of injury. IMPRESSION: No evidence of acute intracranial or cervical spine injury. Electronically Signed   By: Tiburcio Pea M.D.   On: 11/01/2022 05:28   CT CERVICAL SPINE WO CONTRAST  Result Date: 11/01/2022 CLINICAL DATA:  Head trauma, minor.  Unwitnessed fall. EXAM: CT HEAD WITHOUT CONTRAST CT CERVICAL SPINE WITHOUT CONTRAST TECHNIQUE: Multidetector CT imaging of the head and cervical spine was performed following the standard protocol without intravenous contrast. Multiplanar CT image reconstructions of the cervical spine were also generated. RADIATION DOSE REDUCTION: This exam was performed according to the departmental dose-optimization program which includes automated exposure control, adjustment of the mA and/or kV according to patient size and/or use of iterative reconstruction technique. COMPARISON:  Five days ago FINDINGS: CT HEAD FINDINGS Brain: No evidence of acute infarction, hemorrhage, hydrocephalus, extra-axial collection or mass lesion/mass effect. Chronic small vessel ischemia in the cerebral white matter with chronic perforator infarct at the left basal ganglia. Vascular: No hyperdense vessel or unexpected calcification. Skull: Normal. Negative for fracture or focal  lesion. Sinuses/Orbits: No evidence of injury. CT CERVICAL SPINE FINDINGS Alignment: Normal. Skull base and vertebrae: No acute fracture. No primary bone lesion or focal pathologic process. Soft tissues and spinal canal: No prevertebral fluid or swelling. No visible canal hematoma. Disc levels:  Ordinary and generalized cervical spine degeneration Upper chest: Biapical scarring with no evidence of injury. IMPRESSION: No evidence of acute intracranial or cervical spine injury. Electronically Signed   By: Tiburcio Pea M.D.   On: 11/01/2022 05:28   DG Chest Port 1 View  Result Date: 11/01/2022 CLINICAL DATA:  87 year old female with possible sepsis. EXAM: PORTABLE CHEST 1 VIEW COMPARISON:  Chest x-ray 10/27/2022. FINDINGS: Lung volumes are very low. Low opacity at the left base favored to reflect small left pleural effusion with associated passive subsegmental atelectasis in the left lower lobe. No definite consolidative airspace disease. No right pleural effusion. No pneumothorax. No evidence of pulmonary edema. Heart size is normal. The patient is severely rotated to the left on today's exam, resulting in distortion of the mediastinal contours and reduced diagnostic sensitivity and specificity for mediastinal pathology. Atherosclerotic calcifications in the thoracic aorta. Left-sided pacemaker device in place with lead tips projecting over the expected location of the right atrium and right ventricle. Soft tissue anchor in the right humeral head incidentally noted. IMPRESSION: 1. Low lung volumes with small left pleural effusion and probable subsegmental atelectasis in the left lung base. 2. Aortic atherosclerosis. Electronically Signed   By: Trudie Reed M.D.   On: 11/01/2022  05:26   DG Chest 2 View  Result Date: 10/27/2022 CLINICAL DATA:  Altered mental status EXAM: CHEST - 2 VIEW COMPARISON:  06/29/2022 FINDINGS: Left-sided pacing device as before. Cardiomegaly. Heterogeneous ground-glass opacities  in the right upper lobe and left mid to lower lung. Aortic atherosclerosis. No pneumothorax. IMPRESSION: Heterogeneous ground-glass opacities in the right upper lobe and left mid to lower lung, suspect for multifocal pneumonia. Cardiomegaly. Electronically Signed   By: Jasmine PangKim  Fujinaga M.D.   On: 10/27/2022 21:58   CT Head Wo Contrast  Result Date: 10/27/2022 CLINICAL DATA:  Altered mental status EXAM: CT HEAD WITHOUT CONTRAST TECHNIQUE: Contiguous axial images were obtained from the base of the skull through the vertex without intravenous contrast. RADIATION DOSE REDUCTION: This exam was performed according to the departmental dose-optimization program which includes automated exposure control, adjustment of the mA and/or kV according to patient size and/or use of iterative reconstruction technique. COMPARISON:  CT brain 12/11/2021 FINDINGS: Brain: No acute territorial infarction, hemorrhage or intracranial mass. Probable chronic lacunar infarct left basal ganglia. Patchy white matter hypodensity consistent with chronic small vessel ischemic change. Mild atrophy. Nonenlarged ventricles Vascular: No hyperdense vessels.  Carotid vascular calcification Skull: Normal. Negative for fracture or focal lesion. Sinuses/Orbits: No acute finding. Other: None IMPRESSION: 1. No CT evidence for acute intracranial abnormality. 2. Atrophy and chronic small vessel ischemic changes of the white matter. Electronically Signed   By: Jasmine PangKim  Fujinaga M.D.   On: 10/27/2022 21:32   CUP PACEART REMOTE DEVICE CHECK  Result Date: 10/23/2022 Scheduled remote reviewed. Normal device function.  Next remote 91 days. [place]   Microbiology: Results for orders placed or performed during the hospital encounter of 11/05/22  Blood culture (routine x 2)     Status: None   Collection Time: 11/05/22  8:23 PM   Specimen: BLOOD RIGHT FOREARM  Result Value Ref Range Status   Specimen Description BLOOD RIGHT FOREARM  Final   Special Requests   Final     BOTTLES DRAWN AEROBIC AND ANAEROBIC Blood Culture adequate volume   Culture   Final    NO GROWTH 5 DAYS Performed at [facility], [address] [place], [place] [address]    Report Status 11/10/2022 FINAL  Final  Blood culture (routine x 2)     Status: None   Collection Time: 11/05/22  8:28 PM   Specimen: BLOOD RIGHT FOREARM  Result Value Ref Range Status   Specimen Description BLOOD RIGHT FOREARM  Final   Special Requests   Final    BOTTLES DRAWN AEROBIC ONLY Blood Culture adequate volume   Culture   Final    NO GROWTH 5 DAYS Performed at [facility], [address] [place], [place] [address]    Report Status 11/10/2022 FINAL  Final    Labs: CBC: Recent Labs  Lab 11/05/22 1817 11/05/22 1908 11/06/22 0407 11/09/22 0404 11/10/22 0451  WBC  --  8.6 6.7 6.4  --   NEUTROABS  --  5.9  --   --   --   HGB 13.6 13.1 11.9* 12.4 12.1  HCT 40.0 40.3 36.4 39.0 37.9  MCV  --  92.9 94.1 95.1  --   PLT  --  208 191 195  --    Basic Metabolic Panel: Recent Labs  Lab 11/06/22 0407 11/07/22 0013 11/07/22 0406 11/09/22 0404 11/10/22 0451  NA 139 137 139 138 137  K 3.0* 3.4* 3.6 3.3* 3.6  CL 106 107 108 106 105  CO2 25  23 24 25 26   GLUCOSE 86 91 84 92 86  BUN 11 13 11 12 14   CREATININE 0.66 0.80 0.72 0.81 0.74  CALCIUM 8.9 9.0 9.0 9.2 9.2  MG 2.0  --  2.0 1.9 1.9  PHOS 3.0  --   --  3.7  --    Liver Function Tests: Recent Labs  Lab 11/05/22 1833 11/06/22 0407  AST 33 26  ALT 23 20  ALKPHOS 80 68  BILITOT 1.1 1.3*  PROT 7.6 6.3*  ALBUMIN 3.9 3.2*   CBG: Recent Labs  Lab 11/05/22 1811  GLUCAP 111*    Discharge time spent: greater than 30 minutes.  Signed: Catarina Hartshorn, MD [facility] 11/11/2022

## 2022-11-11 NOTE — Plan of Care (Signed)
Patient AOX3, disoriented to time fully. VSS throughout shift. All meds refused by pt.  Pt only took xanax. Pt denied pain. Diminished lungs, IS encouraged. Purewick in place. Pt has impaired hearing, hearing aids at home. POC maintained, will continue to monitor.   Problem: Education: Goal: Knowledge of General Education information will improve Description: Including pain rating scale, medication(s)/side effects and non-pharmacologic comfort measures Outcome: Progressing   Problem: Health Behavior/Discharge Planning: Goal: Ability to manage health-related needs will improve Outcome: Progressing   Problem: Clinical Measurements: Goal: Ability to maintain clinical measurements within normal limits will improve Outcome: Progressing Goal: Will remain free from infection Outcome: Progressing Goal: Diagnostic test results will improve Outcome: Progressing Goal: Respiratory complications will improve Outcome: Progressing Goal: Cardiovascular complication will be avoided Outcome: Progressing   Problem: Activity: Goal: Risk for activity intolerance will decrease Outcome: Progressing   Problem: Nutrition: Goal: Adequate nutrition will be maintained Outcome: Progressing   Problem: Coping: Goal: Level of anxiety will decrease Outcome: Progressing   Problem: Elimination: Goal: Will not experience complications related to bowel motility Outcome: Progressing Goal: Will not experience complications related to urinary retention Outcome: Progressing   Problem: Pain Managment: Goal: General experience of comfort will improve Outcome: Progressing   Problem: Safety: Goal: Ability to remain free from injury will improve Outcome: Progressing   Problem: Skin Integrity: Goal: Risk for impaired skin integrity will decrease Outcome: Progressing   Problem: Education: Goal: Knowledge of General Education information will improve Description: Including pain rating scale, medication(s)/side  effects and non-pharmacologic comfort measures Outcome: Progressing   Problem: Health Behavior/Discharge Planning: Goal: Ability to manage health-related needs will improve Outcome: Progressing   Problem: Clinical Measurements: Goal: Ability to maintain clinical measurements within normal limits will improve Outcome: Progressing Goal: Will remain free from infection Outcome: Progressing Goal: Diagnostic test results will improve Outcome: Progressing Goal: Respiratory complications will improve Outcome: Progressing Goal: Cardiovascular complication will be avoided Outcome: Progressing   Problem: Activity: Goal: Risk for activity intolerance will decrease Outcome: Progressing   Problem: Nutrition: Goal: Adequate nutrition will be maintained Outcome: Progressing   Problem: Coping: Goal: Level of anxiety will decrease Outcome: Progressing   Problem: Elimination: Goal: Will not experience complications related to bowel motility Outcome: Progressing Goal: Will not experience complications related to urinary retention Outcome: Progressing   Problem: Pain Managment: Goal: General experience of comfort will improve Outcome: Progressing   Problem: Safety: Goal: Ability to remain free from injury will improve Outcome: Progressing   Problem: Skin Integrity: Goal: Risk for impaired skin integrity will decrease Outcome: Progressing   Problem: Education: Goal: Knowledge of General Education information will improve Description: Including pain rating scale, medication(s)/side effects and non-pharmacologic comfort measures Outcome: Progressing   Problem: Health Behavior/Discharge Planning: Goal: Ability to manage health-related needs will improve Outcome: Progressing   Problem: Clinical Measurements: Goal: Ability to maintain clinical measurements within normal limits will improve Outcome: Progressing Goal: Will remain free from infection Outcome: Progressing Goal:  Diagnostic test results will improve Outcome: Progressing Goal: Respiratory complications will improve Outcome: Progressing Goal: Cardiovascular complication will be avoided Outcome: Progressing   Problem: Activity: Goal: Risk for activity intolerance will decrease Outcome: Progressing   Problem: Nutrition: Goal: Adequate nutrition will be maintained Outcome: Progressing   Problem: Coping: Goal: Level of anxiety will decrease Outcome: Progressing   Problem: Elimination: Goal: Will not experience complications related to bowel motility Outcome: Progressing Goal: Will not experience complications related to urinary retention Outcome: Progressing   Problem: Pain Managment: Goal: General experience  of comfort will improve Outcome: Progressing   Problem: Safety: Goal: Ability to remain free from injury will improve Outcome: Progressing   Problem: Skin Integrity: Goal: Risk for impaired skin integrity will decrease Outcome: Progressing

## 2022-11-12 DIAGNOSIS — D490 Neoplasm of unspecified behavior of digestive system: Secondary | ICD-10-CM | POA: Diagnosis not present

## 2022-11-12 DIAGNOSIS — E876 Hypokalemia: Secondary | ICD-10-CM | POA: Diagnosis not present

## 2022-11-12 DIAGNOSIS — I498 Other specified cardiac arrhythmias: Secondary | ICD-10-CM | POA: Diagnosis not present

## 2022-11-12 DIAGNOSIS — Z950 Presence of cardiac pacemaker: Secondary | ICD-10-CM | POA: Diagnosis not present

## 2022-11-12 DIAGNOSIS — E860 Dehydration: Secondary | ICD-10-CM | POA: Diagnosis not present

## 2022-11-12 DIAGNOSIS — Z743 Need for continuous supervision: Secondary | ICD-10-CM | POA: Diagnosis not present

## 2022-11-12 DIAGNOSIS — Z955 Presence of coronary angioplasty implant and graft: Secondary | ICD-10-CM | POA: Diagnosis not present

## 2022-11-12 DIAGNOSIS — G9341 Metabolic encephalopathy: Secondary | ICD-10-CM | POA: Diagnosis not present

## 2022-11-12 DIAGNOSIS — Z87891 Personal history of nicotine dependence: Secondary | ICD-10-CM | POA: Diagnosis not present

## 2022-11-12 DIAGNOSIS — I5032 Chronic diastolic (congestive) heart failure: Secondary | ICD-10-CM | POA: Diagnosis not present

## 2022-11-12 DIAGNOSIS — I700 Atherosclerosis of aorta: Secondary | ICD-10-CM | POA: Diagnosis not present

## 2022-11-12 DIAGNOSIS — I495 Sick sinus syndrome: Secondary | ICD-10-CM | POA: Diagnosis not present

## 2022-11-12 DIAGNOSIS — I482 Chronic atrial fibrillation, unspecified: Secondary | ICD-10-CM | POA: Diagnosis not present

## 2022-11-12 DIAGNOSIS — J9611 Chronic respiratory failure with hypoxia: Secondary | ICD-10-CM | POA: Diagnosis not present

## 2022-11-12 DIAGNOSIS — I4891 Unspecified atrial fibrillation: Secondary | ICD-10-CM | POA: Diagnosis not present

## 2022-11-12 DIAGNOSIS — E785 Hyperlipidemia, unspecified: Secondary | ICD-10-CM | POA: Diagnosis not present

## 2022-11-12 DIAGNOSIS — R109 Unspecified abdominal pain: Secondary | ICD-10-CM | POA: Diagnosis not present

## 2022-11-12 DIAGNOSIS — R5381 Other malaise: Secondary | ICD-10-CM | POA: Diagnosis not present

## 2022-11-12 DIAGNOSIS — J439 Emphysema, unspecified: Secondary | ICD-10-CM | POA: Diagnosis not present

## 2022-11-12 DIAGNOSIS — I110 Hypertensive heart disease with heart failure: Secondary | ICD-10-CM | POA: Diagnosis not present

## 2022-11-12 DIAGNOSIS — Z79899 Other long term (current) drug therapy: Secondary | ICD-10-CM | POA: Diagnosis not present

## 2022-11-12 DIAGNOSIS — Z7982 Long term (current) use of aspirin: Secondary | ICD-10-CM | POA: Diagnosis not present

## 2022-11-12 DIAGNOSIS — J449 Chronic obstructive pulmonary disease, unspecified: Secondary | ICD-10-CM | POA: Diagnosis not present

## 2022-11-12 DIAGNOSIS — R296 Repeated falls: Secondary | ICD-10-CM | POA: Diagnosis not present

## 2022-11-12 DIAGNOSIS — R2689 Other abnormalities of gait and mobility: Secondary | ICD-10-CM | POA: Diagnosis not present

## 2022-11-12 DIAGNOSIS — R531 Weakness: Secondary | ICD-10-CM | POA: Diagnosis not present

## 2022-11-12 DIAGNOSIS — I5022 Chronic systolic (congestive) heart failure: Secondary | ICD-10-CM | POA: Diagnosis not present

## 2022-11-12 DIAGNOSIS — F0390 Unspecified dementia without behavioral disturbance: Secondary | ICD-10-CM | POA: Diagnosis not present

## 2022-11-12 DIAGNOSIS — I4819 Other persistent atrial fibrillation: Secondary | ICD-10-CM | POA: Diagnosis not present

## 2022-11-12 DIAGNOSIS — R410 Disorientation, unspecified: Secondary | ICD-10-CM | POA: Diagnosis not present

## 2022-11-12 DIAGNOSIS — I2510 Atherosclerotic heart disease of native coronary artery without angina pectoris: Secondary | ICD-10-CM | POA: Diagnosis not present

## 2022-11-12 DIAGNOSIS — M6281 Muscle weakness (generalized): Secondary | ICD-10-CM | POA: Diagnosis not present

## 2022-11-12 DIAGNOSIS — K5900 Constipation, unspecified: Secondary | ICD-10-CM | POA: Diagnosis not present

## 2022-11-12 DIAGNOSIS — R627 Adult failure to thrive: Secondary | ICD-10-CM | POA: Diagnosis not present

## 2022-11-12 DIAGNOSIS — R000 Tachycardia, unspecified: Secondary | ICD-10-CM | POA: Diagnosis not present

## 2022-11-12 MED ORDER — ALPRAZOLAM 1 MG PO TABS: 1.0000 mg | ORAL_TABLET | Freq: Three times a day (TID) | ORAL | 0 refills | Status: DC | PRN

## 2022-11-12 MED ORDER — MELATONIN 3 MG PO TABS
6.0000 mg | ORAL_TABLET | Freq: Once | ORAL | Status: AC
  Administered 2022-11-12: 6 mg via ORAL
  Filled 2022-11-12: qty 2

## 2022-11-12 NOTE — Progress Notes (Signed)
11/12/2022 10:42 AM  Pt was seen and examined at bedside, She remains stable to discharge to SNF today.  Please see dictated DC summary from Dr. Onalee Hua Tat.    Vitals:   11/11/22 1900 11/12/22 0400  BP: (!) 155/73 (!) 163/62  Pulse: 60 67  Resp: 18 16  Temp: (!) 97.4 F (36.3 C) (!) 97.4 F (36.3 C)  SpO2: 98% 99%   Rodney Langton, MD How to contact the [facility] Attending or Consulting provider 7A - 7P or covering provider during after hours 7P -7A, for this patient?  Check the care team in [facility] and look for a) attending/consulting TRH provider listed and b) the [facility] team listed Log into [email or web address] and use [facility] universal password to access. If you do not have the password, please contact the hospital operator. Locate the [facility] provider you are looking for under [facility] and page to a number that you can be directly reached. If you still have difficulty reaching the provider, please page the [facility] (Director on Call) for the Hospitalists listed on amion for assistance.

## 2022-11-12 NOTE — Progress Notes (Signed)
Pt has been tearful during the night and did not sleep, despite receiving xanax and melatonin. BP elevated.

## 2022-11-12 NOTE — Discharge Instructions (Signed)
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
## 2022-11-12 NOTE — TOC Transition Note (Signed)
Transition of Care [facility]) - CM/SW Discharge Note   Patient Details  Name: [name] MRN: [id number] Date of Birth: September 01, 1935  [facility] of [facility]) CM/SW Contact:  Annice Needy, LCSW Phone Number: 11/12/2022, 12:18 PM   Clinical Narrative:    Discharge clinicals sent to facility. Nurse to call report. TOC signing off.    Final next level of care: Skilled Nursing Facility Barriers to Discharge: No Barriers Identified   Patient Goals and CMS Choice CMS Medicare.gov Compare Post Acute Care list provided to:: Patient Represenative (must comment) Choice offered to / list presented to : Adult Children  Discharge Placement                Patient chooses bed at: Other - please specify in the comment section below: ([facility]) Patient to be transferred to facility by: [facility] Name of family member notified: granddaughter, April Patient and family notified of of transfer: 11/12/22  Discharge Plan and Services Additional resources added to the After Visit Summary for       Post Acute Care Choice: Skilled Nursing Facility                               Social Determinants of Health (SDOH) Interventions SDOH Screenings   Food Insecurity: No Food Insecurity (11/06/2022)  Housing: Low Risk  (11/06/2022)  Transportation Needs: No Transportation Needs (11/06/2022)  Utilities: At Risk (11/06/2022)  Alcohol Screen: Low Risk  (11/05/2022)  Depression (PHQ2-9): Low Risk  (11/05/2022)  Financial Resource Strain: Low Risk  (11/05/2022)  Physical Activity: Inactive (11/05/2022)  Social Connections: Moderately Integrated (11/05/2022)  Stress: No Stress Concern Present (11/05/2022)  Tobacco Use: Medium Risk (11/11/2022)     Readmission Risk Interventions    07/02/2022   12:34 PM  Readmission Risk Prevention Plan  Transportation Screening Complete  PCP or Specialist Appt within 5-7 Days Complete  Home Care Screening Complete  Medication Review (RN CM) Complete

## 2022-11-12 NOTE — Care Management Important Message (Signed)
Important Message  Patient Details  Name: [name] MRN: [id number] Date of Birth: 10/25/1935   Medicare Important Message Given:  Yes (spoke with family member [name] at [phone] to reveiw letter.  No additonal copy needed)     Corey Harold 11/12/2022, 10:59 AM

## 2022-11-17 DIAGNOSIS — I2510 Atherosclerotic heart disease of native coronary artery without angina pectoris: Secondary | ICD-10-CM | POA: Diagnosis not present

## 2022-11-17 DIAGNOSIS — G9341 Metabolic encephalopathy: Secondary | ICD-10-CM | POA: Diagnosis not present

## 2022-11-17 DIAGNOSIS — J449 Chronic obstructive pulmonary disease, unspecified: Secondary | ICD-10-CM | POA: Diagnosis not present

## 2022-11-17 DIAGNOSIS — I4891 Unspecified atrial fibrillation: Secondary | ICD-10-CM | POA: Diagnosis not present

## 2022-11-17 DIAGNOSIS — K5900 Constipation, unspecified: Secondary | ICD-10-CM | POA: Diagnosis not present

## 2022-11-17 DIAGNOSIS — R296 Repeated falls: Secondary | ICD-10-CM | POA: Diagnosis not present

## 2022-11-19 ENCOUNTER — Ambulatory Visit: Payer: Self-pay | Admitting: *Deleted

## 2022-11-19 ENCOUNTER — Encounter: Payer: Self-pay | Admitting: *Deleted
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
## 2022-11-19 NOTE — Patient Instructions (Signed)
Visit Information  Thank you for taking time to visit with me today. Please don't hesitate to contact me if I can be of assistance to you.   Following are the goals we discussed today:   Goals Addressed             This Visit's Progress    Assist with Arranging In-Home Care Services.   On track    Care Coordination Interventions:  Interventions Today    Flowsheet Row Most Recent Value  Chronic Disease   Chronic disease during today's visit Other  [Generalized Anxiety Disorder, Caregiver Stress/Burnout]  General Interventions   General Interventions Discussed/Reviewed General Interventions Discussed, Labs, Vaccines, Doctor Visits, Health Screening, General Interventions Reviewed, Annual Eye Exam, Durable Medical Equipment (DME), Community Resources, Level of Care, Communication with  [Communication with Primary Care Provider]  Vaccines COVID-19, Flu, Pneumonia, RSV, Shingles, Tetanus/Pertussis/Diphtheria  [Encouraged]  Doctor Visits Discussed/Reviewed Doctor Visits Discussed, Specialist, Doctor Visits Reviewed, Annual Wellness Visits, PCP  [Encouraged]  Health Screening Bone Density, Colonoscopy, Mammogram  [Encouraged]  Durable Medical Equipment (DME) Shower bench, BP Cuff, Walker  PCP/Specialist Visits Compliance with follow-up visit  [Encouraged]  Communication with PCP/Specialists, RN  Level of Care Adult Daycare, Personal Care Services, Applications, Assisted Living, Skilled Nursing Facility  [Encouraged]  Applications Medicaid, Personal Care Services, [place]-2  Exercise Interventions   Exercise Discussed/Reviewed Exercise Discussed, Assistive device use and maintanence, Exercise Reviewed, Physical Activity, Weight Managment  [Encouraged]  Physical Activity Discussed/Reviewed Physical Activity Discussed, Home Exercise Program (HEP), Physical Activity Reviewed, Types of exercise  [Encouraged]  Weight Management Weight maintenance  [Encouraged]  Education Interventions   Education  Provided Provided Therapist, sports, Provided Web-based Education, Provided Education  Provided Verbal Education On Nutrition, Mental Health/Coping with Illness, When to see the doctor, [employer], [employer], Medication, Exercise, Applications, Foot Care, Eye Care, Labs  [Encouraged]  Applications Medicaid, Personal Care Services, [place]  Mental Health Interventions   Mental Health Discussed/Reviewed Mental Health Discussed, Anxiety, Depression, Grief and Loss, Substance Abuse, Suicide, Other, Mental Health Reviewed, Coping Strategies, Crisis  [Domestic Violence]  Nutrition Interventions   Nutrition Discussed/Reviewed Nutrition Discussed, Adding fruits and vegetables, Increaing proteins, Decreasing fats, Decreasing salt, Supplmental nutrition, Decreasing sugar intake, Carbohydrate meal planning, Nutrition Reviewed, Fluid intake, Portion sizes  [Encouraged]  Pharmacy Interventions   Pharmacy Dicussed/Reviewed Pharmacy Topics Discussed, Pharmacy Topics Reviewed, Medications and their functions, Medication Adherence, Affording Medications  [Encouraged]  Safety Interventions   Safety Discussed/Reviewed Safety Discussed, Safety Reviewed, Fall Risk, Home Safety  [Encouraged]  Home Safety Assistive Devices, Need for home safety assessment, Refer for community resources  Advanced Directive Interventions   Advanced Directives Discussed/Reviewed Advanced Directives Discussed, Advanced Directives Reviewed     Active Listening & Reflection Utilized.  Verbalization of Feelings Encouraged.  Emotional Support Provided. Feelings of Caregiver Burnout Validated. Caregiver Stress Acknowledged. Caregiver Resources Reviewed. Caregiver Support Groups Mailed. Self-Enrollment in Caregiver Support Group of Interest Emphasized. Crisis Support Information, Agencies, Services & Resources Revisited. Problem Solving Interventions Indicated. Task-Centered Solutions Implemented.   Solution-Focused Strategies  Activated. CSW Collaboration with Granddaughter, [name] to Confirm Patient's Continued Residency at [facility] [phone]), to Receive Short-Term Rehabilitative Services. CSW Collaboration with Granddaughter, [name] to Encourage Completion of Application for [employer] & Submission to [facility] [phone]), for Processing. CSW Collaboration with Granddaughter, [name] to Encourage Completion of Application for [employer], through [employer] [phone]), if Approved for Medicaid &  Not Agreeable to Long-Term Care Placement.  CSW Collaboration with Granddaughter, [name] to [employer] for [employer], Scheduled on 12/03/2022. CSW Collaboration with Granddaughter, [name] to [employer] with CSW [phone]# [phone]), if She Has Questions, Needs Assistance, or If Additional Social Work Needs Are Identified Between Now & Our Next Scheduled Telephone [employer].      Our next appointment is by telephone on 12/02/2022 at 2:15 pm.  Please call the care guide team at [phone] if you need to cancel or reschedule your appointment.   If you are experiencing a Mental Health or Behavioral Health Crisis or need someone to talk to, please call the Suicide and Crisis Lifeline: 988 call the [place] National Suicide Prevention Lifeline: 579-205-4689 or TTY: 3050850349 TTY 705-648-7025) to talk to a trained counselor call 1-800-273-TALK (toll free, 24 hour hotline) go to [facility] [address], [place] [phone]) call the [facility] Crisis Line: [phone] call 911  Patient verbalizes understanding of instructions and care plan provided today and agrees to view in MyChart. Active MyChart status and patient understanding of how to access instructions and care plan via MyChart confirmed with  patient.     Telephone follow up appointment with care management team member scheduled for:  12/02/2022 at 2:15 pm.  Danford Bad, BSW, MSW, LCSW  Licensed Clinical Social Worker  [facility]  Mailing [place]. [address], [place], [place] [address] Physical Address-[address], [place], [place] [address] Toll Free Main # [phone] Fax # [phone] Cell # [phone] [email or web address]

## 2022-11-20 DIAGNOSIS — J9611 Chronic respiratory failure with hypoxia: Secondary | ICD-10-CM | POA: Diagnosis not present

## 2022-11-20 DIAGNOSIS — G9341 Metabolic encephalopathy: Secondary | ICD-10-CM | POA: Diagnosis not present

## 2022-11-20 DIAGNOSIS — R627 Adult failure to thrive: Secondary | ICD-10-CM | POA: Diagnosis not present

## 2022-11-20 DIAGNOSIS — I495 Sick sinus syndrome: Secondary | ICD-10-CM | POA: Diagnosis not present

## 2022-11-20 DIAGNOSIS — R296 Repeated falls: Secondary | ICD-10-CM | POA: Diagnosis not present

## 2022-11-20 DIAGNOSIS — I4891 Unspecified atrial fibrillation: Secondary | ICD-10-CM | POA: Diagnosis not present

## 2022-11-20 DIAGNOSIS — I2510 Atherosclerotic heart disease of native coronary artery without angina pectoris: Secondary | ICD-10-CM | POA: Diagnosis not present

## 2022-11-24 ENCOUNTER — Emergency Department (HOSPITAL_COMMUNITY): Payer: 59

## 2022-11-24 ENCOUNTER — Other Ambulatory Visit: Payer: Self-pay

## 2022-11-24 ENCOUNTER — Encounter (HOSPITAL_COMMUNITY): Payer: Self-pay | Admitting: Emergency Medicine

## 2022-11-24 ENCOUNTER — Observation Stay (HOSPITAL_COMMUNITY)
Admission: EM | Admit: 2022-11-24 | Discharge: 2022-11-25 | Disposition: A | Discharge status: Skilled Nursing Facility | Payer: 59 | Attending: Family Medicine | Admitting: Family Medicine

## 2022-11-24 DIAGNOSIS — F0390 Unspecified dementia without behavioral disturbance: Secondary | ICD-10-CM | POA: Insufficient documentation

## 2022-11-24 DIAGNOSIS — I10 Essential (primary) hypertension: Secondary | ICD-10-CM | POA: Diagnosis present

## 2022-11-24 DIAGNOSIS — Z87891 Personal history of nicotine dependence: Secondary | ICD-10-CM | POA: Diagnosis not present

## 2022-11-24 DIAGNOSIS — I498 Other specified cardiac arrhythmias: Secondary | ICD-10-CM | POA: Diagnosis not present

## 2022-11-24 DIAGNOSIS — I495 Sick sinus syndrome: Secondary | ICD-10-CM | POA: Diagnosis not present

## 2022-11-24 DIAGNOSIS — R627 Adult failure to thrive: Secondary | ICD-10-CM | POA: Diagnosis not present

## 2022-11-24 DIAGNOSIS — E860 Dehydration: Secondary | ICD-10-CM | POA: Insufficient documentation

## 2022-11-24 DIAGNOSIS — E876 Hypokalemia: Secondary | ICD-10-CM | POA: Insufficient documentation

## 2022-11-24 DIAGNOSIS — I4891 Unspecified atrial fibrillation: Secondary | ICD-10-CM | POA: Diagnosis not present

## 2022-11-24 DIAGNOSIS — Z7982 Long term (current) use of aspirin: Secondary | ICD-10-CM | POA: Diagnosis not present

## 2022-11-24 DIAGNOSIS — I110 Hypertensive heart disease with heart failure: Secondary | ICD-10-CM | POA: Insufficient documentation

## 2022-11-24 DIAGNOSIS — J449 Chronic obstructive pulmonary disease, unspecified: Secondary | ICD-10-CM | POA: Diagnosis not present

## 2022-11-24 DIAGNOSIS — Z950 Presence of cardiac pacemaker: Secondary | ICD-10-CM | POA: Diagnosis not present

## 2022-11-24 DIAGNOSIS — Z79899 Other long term (current) drug therapy: Secondary | ICD-10-CM | POA: Insufficient documentation

## 2022-11-24 DIAGNOSIS — J9611 Chronic respiratory failure with hypoxia: Secondary | ICD-10-CM | POA: Diagnosis not present

## 2022-11-24 DIAGNOSIS — I2510 Atherosclerotic heart disease of native coronary artery without angina pectoris: Secondary | ICD-10-CM | POA: Insufficient documentation

## 2022-11-24 DIAGNOSIS — I5032 Chronic diastolic (congestive) heart failure: Secondary | ICD-10-CM | POA: Diagnosis not present

## 2022-11-24 DIAGNOSIS — Z955 Presence of coronary angioplasty implant and graft: Secondary | ICD-10-CM | POA: Insufficient documentation

## 2022-11-24 DIAGNOSIS — R410 Disorientation, unspecified: Secondary | ICD-10-CM | POA: Diagnosis not present

## 2022-11-24 DIAGNOSIS — R296 Repeated falls: Secondary | ICD-10-CM | POA: Diagnosis not present

## 2022-11-24 DIAGNOSIS — G9341 Metabolic encephalopathy: Secondary | ICD-10-CM | POA: Diagnosis not present

## 2022-11-24 DIAGNOSIS — F419 Anxiety disorder, unspecified: Secondary | ICD-10-CM | POA: Diagnosis present

## 2022-11-24 DIAGNOSIS — R531 Weakness: Secondary | ICD-10-CM | POA: Diagnosis present

## 2022-11-24 DIAGNOSIS — I700 Atherosclerosis of aorta: Secondary | ICD-10-CM | POA: Diagnosis not present

## 2022-11-24 DIAGNOSIS — R109 Unspecified abdominal pain: Secondary | ICD-10-CM | POA: Diagnosis not present

## 2022-11-24 DIAGNOSIS — I4819 Other persistent atrial fibrillation: Secondary | ICD-10-CM | POA: Insufficient documentation

## 2022-11-24 LAB — COMPREHENSIVE METABOLIC PANEL
ALT: 20 U/L (ref 0–44)
AST: 29 U/L (ref 15–41)
Albumin: 3.9 g/dL (ref 3.5–5.0)
Alkaline Phosphatase: 83 U/L (ref 38–126)
Anion gap: 12 (ref 5–15)
BUN: 18 mg/dL (ref 8–23)
CO2: 21 mmol/L — ABNORMAL LOW (ref 22–32)
Calcium: 9.9 mg/dL (ref 8.9–10.3)
Chloride: 99 mmol/L (ref 98–111)
Creatinine, Ser: 0.86 mg/dL (ref 0.44–1.00)
GFR, Estimated: >60 mL/min (ref >60)
Glucose, Bld: 131 mg/dL — ABNORMAL HIGH (ref 70–99)
Potassium: 2.7 mmol/L — CL (ref 3.5–5.1)
Sodium: 132 mmol/L — ABNORMAL LOW (ref 135–145)
Total Bilirubin: 1.1 mg/dL (ref 0.3–1.2)
Total Protein: 7.7 g/dL (ref 6.5–8.1)

## 2022-11-24 LAB — CBC WITH DIFFERENTIAL/PLATELET
Abs Immature Granulocytes: 0.04 10*3/uL (ref 0.00–0.07)
Basophils Absolute: 0 10*3/uL (ref 0.0–0.1)
Basophils Relative: 0 %
Eosinophils Absolute: 0 10*3/uL (ref 0.0–0.5)
Eosinophils Relative: 0 %
HCT: 40.6 % (ref 36.0–46.0)
Hemoglobin: 13.5 g/dL (ref 12.0–15.0)
Immature Granulocytes: 0 %
Lymphocytes Relative: 8 %
Lymphs Abs: 0.8 10*3/uL (ref 0.7–4.0)
MCH: 30.5 pg (ref 26.0–34.0)
MCHC: 33.3 g/dL (ref 30.0–36.0)
MCV: 91.6 fL (ref 80.0–100.0)
Monocytes Absolute: 0.9 10*3/uL (ref 0.1–1.0)
Monocytes Relative: 9 %
Neutro Abs: 8.4 10*3/uL — ABNORMAL HIGH (ref 1.7–7.7)
Neutrophils Relative %: 83 %
Platelets: 223 10*3/uL (ref 150–400)
RBC: 4.43 MIL/uL (ref 3.87–5.11)
RDW: 14.8 % (ref 11.5–15.5)
WBC: 10.2 10*3/uL (ref 4.0–10.5)
nRBC: 0 % (ref 0.0–0.2)

## 2022-11-24 LAB — LIPASE, BLOOD: Lipase: 32 U/L (ref 11–51)

## 2022-11-24 MED ORDER — POTASSIUM CHLORIDE CRYS ER 20 MEQ PO TBCR
40.0000 meq | EXTENDED_RELEASE_TABLET | Freq: Once | ORAL | Status: DC
  Filled 2022-11-24: qty 2

## 2022-11-24 MED ORDER — POTASSIUM CHLORIDE 10 MEQ/100ML IV SOLN
10.0000 meq | Freq: Once | INTRAVENOUS | Status: AC
  Administered 2022-11-24: 10 meq via INTRAVENOUS
  Filled 2022-11-24: qty 100

## 2022-11-24 MED ORDER — SODIUM CHLORIDE 0.9 % IV BOLUS
500.0000 mL | Freq: Once | INTRAVENOUS | Status: AC
  Administered 2022-11-24: 500 mL via INTRAVENOUS

## 2022-11-24 MED ORDER — IOHEXOL 300 MG/ML  SOLN
100.0000 mL | Freq: Once | INTRAMUSCULAR | Status: AC | PRN
  Administered 2022-11-24: 100 mL via INTRAVENOUS

## 2022-11-24 NOTE — ED Provider Notes (Signed)
[place] EMERGENCY DEPARTMENT AT [facility] Provider Note   CSN: [id number] Arrival date & time: 11/24/22  2051     History {Add pertinent medical, surgical, social history, OB history to HPI:1} Chief Complaint  Patient presents with   SNF Placement    [name] is a 87 y.o. female.  Patient has a history of atrial fibs coronary artery disease COPD and dementia.  She presents with weakness and nausea   Weakness      Home Medications Prior to Admission medications   Medication Sig Start Date End Date Taking? Authorizing Provider  albuterol (PROVENTIL) (2.5 MG/3ML) 0.083% nebulizer solution Take 3 mLs (2.5 mg total) by nebulization every 4 (four) hours as needed for wheezing or shortness of breath. 07/02/22 07/02/23  Shon Hale, MD  ALPRAZolam Prudy Feeler) 1 MG tablet Take 1 tablet (1 mg total) by mouth 3 (three) times daily as needed for anxiety. 11/12/22   Johnson, Clanford L, MD  amiodarone (PACERONE) 200 MG tablet TAKE 1 TABLET ONCE DAILY ON MONDAY THROUGH SATURDAY, NONE ON SUNDAY. Patient taking differently: Take 200 mg by mouth See admin instructions. TAKE 1 TABLET ONCE DAILY ON MONDAY THROUGH SATURDAY, NONE ON SUNDAY. 08/07/22   Marinus Maw, MD  aspirin EC 81 MG tablet Take 1 tablet (81 mg total) by mouth daily with breakfast. Swallow whole. 07/02/22   Shon Hale, MD  Cyanocobalamin (VITAMIN B-12) 5000 MCG SUBL Take 5,000 mcg by mouth daily.    [provider]  folic acid (FOLVITE) 1 MG tablet Take 1 tablet (1 mg total) by mouth daily. 11/12/22   Catarina Hartshorn, MD  isosorbide mononitrate (IMDUR) 30 MG 24 hr tablet Take 1 tablet (30 mg total) by mouth daily. 07/02/22   Shon Hale, MD  JARDIANCE 25 MG TABS tablet Take 25 mg by mouth daily.    [provider]  metoprolol succinate (TOPROL-XL) 50 MG 24 hr tablet Take 1 tablet (50 mg total) by mouth 2 (two) times daily. Take with or immediately following a meal. 07/02/22   Emokpae,  Courage, MD  Multiple Vitamin (MULTIVITAMIN WITH MINERALS) TABS tablet Take 1 tablet by mouth daily.     [provider]  nitroGLYCERIN (NITROSTAT) 0.4 MG SL tablet Place 1 tablet (0.4 mg total) under the tongue every 5 (five) minutes x 3 doses as needed for chest pain. 03/22/21   Albertine Grates, MD  ondansetron (ZOFRAN) 4 MG tablet Take 4 mg by mouth every 6 (six) hours.    [provider]  OXYGEN Inhale 2 L into the lungs as needed (PRN as needed at night).    [provider]  PROAIR HFA 108 (90 Base) MCG/ACT inhaler Inhale 1 puff into the lungs every 4 (four) hours as needed for wheezing or shortness of breath.  11/10/18   [provider]  ranolazine (RANEXA) 500 MG 12 hr tablet Take 1 tablet (500 mg total) by mouth 2 (two) times daily. 07/02/22   Shon Hale, MD  rosuvastatin (CRESTOR) 40 MG tablet Take 1 tablet (40 mg total) by mouth daily. 07/02/22   Shon Hale, MD  senna-docusate (SENOKOT-S) 8.6-50 MG tablet Take 1 tablet by mouth at bedtime. 03/22/21   Albertine Grates, MD  ticagrelor (BRILINTA) 60 MG TABS tablet Take 60 mg by mouth 2 (two) times daily.    [provider]  triamcinolone cream (KENALOG) 0.1 % Apply 1 Application topically daily as needed (for rash/skin irritation). 08/25/22   [provider]  vitamin E  180 MG (400 UNITS) capsule Take 400 Units by mouth daily.    [provider]      Allergies    Amitriptyline hcl, Plavix [clopidogrel bisulfate], and Sulfonamide derivatives    Review of Systems   Review of Systems  Neurological:  Positive for weakness.    Physical Exam Updated Vital Signs BP (!) 156/70   Pulse 76   Temp 97.8 F (36.6 C) (Oral)   Resp 20   Ht  (1.651 m)   Wt 64.6 kg   SpO2 96%   BMI 23.70 kg/m  Physical Exam  ED Results / Procedures / Treatments   Labs (all labs ordered are listed, but only abnormal results are displayed) Labs Reviewed  CBC WITH DIFFERENTIAL/PLATELET - Abnormal;  Notable for the following components:      Result Value   Neutro Abs 8.4 (*)    All other components within normal limits  COMPREHENSIVE METABOLIC PANEL - Abnormal; Notable for the following components:   Sodium 132 (*)    Potassium 2.7 (*)    CO2 21 (*)    Glucose, Bld 131 (*)    All other components within normal limits  LIPASE, BLOOD  URINALYSIS, ROUTINE W REFLEX MICROSCOPIC  RAPID URINE DRUG SCREEN, HOSP PERFORMED    EKG None  Radiology DG Chest Port 1 View  Result Date: 11/24/2022 CLINICAL DATA:  Confusion. EXAM: PORTABLE CHEST 1 VIEW COMPARISON:  11/05/2022 FINDINGS: Cardiac pacemaker. Cardiac enlargement with mild central vascular congestion. Interstitial changes may represent fibrosis or mild edema. Appearances are similar to prior study. No pleural effusions. No pneumothorax. No focal consolidation. Mediastinal contours appear intact. Calcified and tortuous aorta. IMPRESSION: Cardiac enlargement with mild interstitial changes, possibly fibrosis or edema. No change since prior study. Electronically Signed   By: Burman Nieves M.D.   On: 11/24/2022 23:00   CT ABDOMEN PELVIS W CONTRAST  Result Date: 11/24/2022 CLINICAL DATA:  Acute nonlocalized abdominal pain. Patient [place] church confused. EXAM: CT ABDOMEN AND PELVIS WITH CONTRAST TECHNIQUE: Multidetector CT imaging of the abdomen and pelvis was performed using the standard protocol following bolus administration of intravenous contrast. RADIATION DOSE REDUCTION: This exam was performed according to the departmental dose-optimization program which includes automated exposure control, adjustment of the mA and/or kV according to patient size and/or use of iterative reconstruction technique. CONTRAST:  OMNIPAQUE IOHEXOL 300 MG/ML  SOLN COMPARISON:  11/01/2022 FINDINGS: Lower chest: Lung bases are clear. Hepatobiliary: Surgical absence of the gallbladder. No bile duct dilatation. Multiple tiny calcifications throughout the  liver, likely calcified granulomas. No focal lesions. Pancreas: Diffuse pancreatic ductal dilatation likely resulting from chronic pancreatitis. Similar appearance to previous study. No acute inflammatory changes or mass lesion identified. Spleen: Calcifications in the spleen, likely granulomas. Adrenals/Urinary Tract: Adrenal glands are unremarkable. Kidneys are normal, without renal calculi, focal lesion, or hydronephrosis. Bladder is unremarkable. Stomach/Bowel: Stomach, small bowel, and colon are not abnormally distended. Scattered stool in the colon. No wall thickening or inflammatory changes. Appendix is not identified. Vascular/Lymphatic: Aortic atherosclerosis. No enlarged abdominal or pelvic lymph nodes. Reproductive: No pelvic masses. Other: No abdominal wall hernia or abnormality. No abdominopelvic ascites. Musculoskeletal: Degenerative changes in the spine. IMPRESSION: 1. No acute process demonstrated in the abdomen or pelvis. No evidence of bowel obstruction or inflammation. 2. Calcified granulomas in the liver and spleen. 3. Diffuse pancreatic ductal dilatation, unchanged since prior study, likely chronic pancreatitis. No acute changes. 4. Aortic atherosclerosis. Electronically Signed   By: Chrissie Noa  Andria Meuse M.D.   On: 11/24/2022 22:58   CT Head Wo Contrast  Result Date: 11/24/2022 CLINICAL DATA:  Acute neurological deficit with stroke suspected. Patient was found at church confused. EXAM: CT HEAD WITHOUT CONTRAST TECHNIQUE: Contiguous axial images were obtained from the base of the skull through the vertex without intravenous contrast. RADIATION DOSE REDUCTION: This exam was performed according to the departmental dose-optimization program which includes automated exposure control, adjustment of the mA and/or kV according to patient size and/or use of iterative reconstruction technique. COMPARISON:  11/06/2022 FINDINGS: Brain: Diffuse cerebral atrophy. Ventricular dilatation consistent with central  atrophy. Low-attenuation changes in the deep white matter consistent with small vessel ischemia. No abnormal extra-axial fluid collections. No mass effect or midline shift. Gray-white matter junctions are distinct. Basal cisterns are not effaced. No acute intracranial hemorrhage. Vascular: No hyperdense vessel or unexpected calcification. Skull: Normal. Negative for fracture or focal lesion. Sinuses/Orbits: No acute finding. Other: None. IMPRESSION: No acute intracranial abnormalities. Chronic atrophy and small vessel ischemic changes. Electronically Signed   By: Burman Nieves M.D.   On: 11/24/2022 22:54    Procedures Procedures  {Document cardiac monitor, telemetry assessment procedure when appropriate:1}  Medications Ordered in ED Medications  potassium chloride 10 mEq in 100 mL IVPB (10 mEq Intravenous New Bag/Given 11/24/22 2255)  potassium chloride SA (KLOR-CON M) CR tablet 40 mEq (40 mEq Oral Not Given 11/24/22 2250)  sodium chloride 0.9 % bolus 500 mL (500 mLs Intravenous New Bag/Given 11/24/22 2254)  iohexol (OMNIPAQUE) 300 MG/ML solution 100 mL (100 mLs Intravenous Contrast Given 11/24/22 2228)    ED Course/ Medical Decision Making/ A&P   {   Click here for ABCD2, HEART and other calculatorsREFRESH Note before signing :1}                          Medical Decision Making Amount and/or Complexity of Data Reviewed Labs: ordered. Radiology: ordered. ECG/medicine tests: ordered.  Risk Prescription drug management. Decision regarding hospitalization.   Patient with hypokalemia.  And dehydration.  She is admitted to medicine  {Document critical care time when appropriate:1} {Document review of labs and clinical decision tools ie heart score, Chads2Vasc2 etc:1}  {Document your independent review of radiology images, and any outside records:1} {Document your discussion with family members, caretakers, and with consultants:1} {Document social determinants of health affecting pt's  care:1} {Document your decision making why or why not admission, treatments were needed:1} Final Clinical Impression(s) / ED Diagnoses Final diagnoses:  Hypokalemia    Rx / DC Orders ED Discharge Orders     None

## 2022-11-24 NOTE — ED Triage Notes (Signed)
Pt brought in by [facility] after concerned bystander called them due to pt being found at a church confused. Pt apparently had drove to church. Pt currently lives alone and has her granddaughter come to check on her.   [facility] was contacted prior to bringing pt in and they advised [facility] to bring pt to ED for now until placement can be found. Pts granddaughter told [facility] that she is unable to take care of pt anymore.

## 2022-11-25 ENCOUNTER — Other Ambulatory Visit: Payer: Self-pay

## 2022-11-25 ENCOUNTER — Encounter: Payer: Self-pay | Admitting: *Deleted

## 2022-11-25 ENCOUNTER — Ambulatory Visit: Payer: Self-pay | Admitting: *Deleted

## 2022-11-25 DIAGNOSIS — I4819 Other persistent atrial fibrillation: Secondary | ICD-10-CM

## 2022-11-25 DIAGNOSIS — J9611 Chronic respiratory failure with hypoxia: Secondary | ICD-10-CM | POA: Diagnosis not present

## 2022-11-25 DIAGNOSIS — I5032 Chronic diastolic (congestive) heart failure: Secondary | ICD-10-CM | POA: Diagnosis not present

## 2022-11-25 DIAGNOSIS — R279 Unspecified lack of coordination: Secondary | ICD-10-CM | POA: Diagnosis not present

## 2022-11-25 DIAGNOSIS — G9341 Metabolic encephalopathy: Secondary | ICD-10-CM | POA: Diagnosis not present

## 2022-11-25 DIAGNOSIS — E876 Hypokalemia: Secondary | ICD-10-CM | POA: Diagnosis not present

## 2022-11-25 DIAGNOSIS — I10 Essential (primary) hypertension: Secondary | ICD-10-CM | POA: Diagnosis not present

## 2022-11-25 DIAGNOSIS — R296 Repeated falls: Secondary | ICD-10-CM | POA: Diagnosis not present

## 2022-11-25 DIAGNOSIS — J449 Chronic obstructive pulmonary disease, unspecified: Secondary | ICD-10-CM | POA: Diagnosis not present

## 2022-11-25 DIAGNOSIS — R627 Adult failure to thrive: Secondary | ICD-10-CM | POA: Diagnosis not present

## 2022-11-25 DIAGNOSIS — E860 Dehydration: Secondary | ICD-10-CM | POA: Diagnosis not present

## 2022-11-25 DIAGNOSIS — I2510 Atherosclerotic heart disease of native coronary artery without angina pectoris: Secondary | ICD-10-CM

## 2022-11-25 DIAGNOSIS — Z743 Need for continuous supervision: Secondary | ICD-10-CM | POA: Diagnosis not present

## 2022-11-25 LAB — CBC
HCT: 40.5 % (ref 36.0–46.0)
Hemoglobin: 13.4 g/dL (ref 12.0–15.0)
MCH: 30.7 pg (ref 26.0–34.0)
MCHC: 33.1 g/dL (ref 30.0–36.0)
MCV: 92.9 fL (ref 80.0–100.0)
Platelets: 197 10*3/uL (ref 150–400)
RBC: 4.36 MIL/uL (ref 3.87–5.11)
RDW: 15.2 % (ref 11.5–15.5)
WBC: 9.3 10*3/uL (ref 4.0–10.5)
nRBC: 0 % (ref 0.0–0.2)

## 2022-11-25 LAB — URINALYSIS, ROUTINE W REFLEX MICROSCOPIC
Bacteria, UA: NONE SEEN
Bilirubin Urine: NEGATIVE
Glucose, UA: >=500 mg/dL — AB
Hgb urine dipstick: NEGATIVE
Ketones, ur: 5 mg/dL — AB
Nitrite: NEGATIVE
Protein, ur: NEGATIVE mg/dL
Specific Gravity, Urine: 1.023 (ref 1.005–1.030)
pH: 6 (ref 5.0–8.0)

## 2022-11-25 LAB — RAPID URINE DRUG SCREEN, HOSP PERFORMED
Amphetamines: NOT DETECTED
Barbiturates: NOT DETECTED
Benzodiazepines: NOT DETECTED
Cocaine: NOT DETECTED
Opiates: NOT DETECTED
Tetrahydrocannabinol: NOT DETECTED

## 2022-11-25 LAB — PHOSPHORUS: Phosphorus: 2.6 mg/dL (ref 2.5–4.6)

## 2022-11-25 LAB — MAGNESIUM: Magnesium: 2 mg/dL (ref 1.7–2.4)

## 2022-11-25 LAB — COMPREHENSIVE METABOLIC PANEL
ALT: 17 U/L (ref 0–44)
AST: 30 U/L (ref 15–41)
Albumin: 3.5 g/dL (ref 3.5–5.0)
Alkaline Phosphatase: 77 U/L (ref 38–126)
Anion gap: 10 (ref 5–15)
BUN: 12 mg/dL (ref 8–23)
CO2: 24 mmol/L (ref 22–32)
Calcium: 9 mg/dL (ref 8.9–10.3)
Chloride: 103 mmol/L (ref 98–111)
Creatinine, Ser: 0.66 mg/dL (ref 0.44–1.00)
GFR, Estimated: >60 mL/min (ref >60)
Glucose, Bld: 96 mg/dL (ref 70–99)
Potassium: 2.7 mmol/L — CL (ref 3.5–5.1)
Sodium: 137 mmol/L (ref 135–145)
Total Bilirubin: 1.5 mg/dL — ABNORMAL HIGH (ref 0.3–1.2)
Total Protein: 6.8 g/dL (ref 6.5–8.1)

## 2022-11-25 MED ORDER — LORAZEPAM 2 MG/ML IJ SOLN: 1.0000 mg | Freq: Once | INTRAMUSCULAR | Status: DC

## 2022-11-25 MED ORDER — METOPROLOL SUCCINATE ER 50 MG PO TB24
50.0000 mg | ORAL_TABLET | Freq: Two times a day (BID) | ORAL | Status: DC
  Filled 2022-11-25: qty 1

## 2022-11-25 MED ORDER — ACETAMINOPHEN 650 MG RE SUPP: 650.0000 mg | Freq: Four times a day (QID) | RECTAL | Status: DC | PRN

## 2022-11-25 MED ORDER — TICAGRELOR 60 MG PO TABS
60.0000 mg | ORAL_TABLET | Freq: Two times a day (BID) | ORAL | Status: DC
  Filled 2022-11-25 (×3): qty 1

## 2022-11-25 MED ORDER — AMIODARONE HCL 200 MG PO TABS
200.0000 mg | ORAL_TABLET | ORAL | Status: DC
  Filled 2022-11-25: qty 1

## 2022-11-25 MED ORDER — POTASSIUM CHLORIDE CRYS ER 20 MEQ PO TBCR
40.0000 meq | EXTENDED_RELEASE_TABLET | Freq: Once | ORAL | Status: AC
  Administered 2022-11-25: 40 meq via ORAL
  Filled 2022-11-25: qty 2

## 2022-11-25 MED ORDER — ACETAMINOPHEN 325 MG PO TABS: 650.0000 mg | ORAL_TABLET | Freq: Four times a day (QID) | ORAL | Status: DC | PRN

## 2022-11-25 MED ORDER — POTASSIUM CHLORIDE ER 20 MEQ PO TBCR: 20.0000 meq | EXTENDED_RELEASE_TABLET | Freq: Every day | ORAL | 0 refills | Status: DC

## 2022-11-25 MED ORDER — ACETAMINOPHEN 325 MG PO TABS: 650.0000 mg | ORAL_TABLET | Freq: Four times a day (QID) | ORAL | 1 refills | Status: DC | PRN

## 2022-11-25 MED ORDER — ROSUVASTATIN CALCIUM 20 MG PO TABS
40.0000 mg | ORAL_TABLET | Freq: Every day | ORAL | Status: DC
  Filled 2022-11-25: qty 2

## 2022-11-25 MED ORDER — ISOSORBIDE MONONITRATE ER 60 MG PO TB24
30.0000 mg | ORAL_TABLET | Freq: Every day | ORAL | Status: DC
  Filled 2022-11-25: qty 1

## 2022-11-25 MED ORDER — PROCHLORPERAZINE EDISYLATE 10 MG/2ML IJ SOLN: 10.0000 mg | Freq: Four times a day (QID) | INTRAMUSCULAR | Status: DC | PRN

## 2022-11-25 MED ORDER — ALBUTEROL SULFATE (2.5 MG/3ML) 0.083% IN NEBU: 2.5000 mg | INHALATION_SOLUTION | RESPIRATORY_TRACT | Status: DC | PRN

## 2022-11-25 MED ORDER — ALPRAZOLAM 1 MG PO TABS: 1.0000 mg | ORAL_TABLET | Freq: Three times a day (TID) | ORAL | Status: DC | PRN

## 2022-11-25 MED ORDER — ENOXAPARIN SODIUM 40 MG/0.4ML IJ SOSY
40.0000 mg | PREFILLED_SYRINGE | INTRAMUSCULAR | Status: DC
  Filled 2022-11-25: qty 0.4

## 2022-11-25 MED ORDER — POTASSIUM CHLORIDE 10 MEQ/100ML IV SOLN: 10.0000 meq | INTRAVENOUS | Status: AC

## 2022-11-25 MED ORDER — ONDANSETRON HCL 4 MG PO TABS: 4.0000 mg | ORAL_TABLET | Freq: Every day | ORAL | 1 refills | Status: DC | PRN

## 2022-11-25 MED ORDER — LORAZEPAM 2 MG/ML IJ SOLN
INTRAMUSCULAR | Status: AC
  Filled 2022-11-25: qty 1

## 2022-11-25 MED ORDER — SENNOSIDES-DOCUSATE SODIUM 8.6-50 MG PO TABS: 2.0000 | ORAL_TABLET | Freq: Every day | ORAL | 1 refills | Status: AC

## 2022-11-25 MED ORDER — SODIUM CHLORIDE 0.9 % IV SOLN: INTRAVENOUS | Status: AC

## 2022-11-25 MED ORDER — ALPRAZOLAM 1 MG PO TABS: 1.0000 mg | ORAL_TABLET | Freq: Two times a day (BID) | ORAL | 0 refills | Status: DC | PRN

## 2022-11-25 MED ORDER — RANOLAZINE ER 500 MG PO TB12
500.0000 mg | ORAL_TABLET | Freq: Two times a day (BID) | ORAL | Status: DC
  Filled 2022-11-25: qty 1

## 2022-11-25 NOTE — TOC Transition Note (Signed)
Transition of Care [facility]) - CM/SW Discharge Note   Patient Details  Name: [name] MRN: [id number] Date of Birth: 04-24-1936  [facility] CM/SW Contact:  Elliot Gault, LCSW Phone Number: 11/25/2022, 5:00 PM   Clinical Narrative:     [facility]ice Blase at [facility] states they will take pt today with medicaid pending.   Pt will dc to [facility] today via EMS. DC clinical sent electronically. RN to call report.   Granddaughter/guardian aware of dc plan and in agreement.  No other TOC needs for dc.    Final next level of care: Long Term Nursing Home Barriers to Discharge: Barriers Resolved   Patient Goals and CMS Choice CMS Medicare.gov Compare Post Acute Care list provided to:: Legal Guardian Choice offered to / list presented to : [facility] POA / Guardian  Discharge Placement                         Discharge Plan and Services Additional resources added to the After Visit Summary for   In-house Referral: Clinical Social Work   Post Acute Care Choice: Nursing Home                               Social Determinants of Health (SDOH) Interventions SDOH Screenings   Food Insecurity: No Food Insecurity (11/25/2022)  Housing: Low Risk  (11/25/2022)  Transportation Needs: No Transportation Needs (11/25/2022)  Utilities: Not At Risk (11/25/2022)  Recent Concern: Utilities - At Risk (11/06/2022)  Alcohol Screen: Low Risk  (11/05/2022)  Depression (PHQ2-9): Low Risk  (11/05/2022)  Financial Resource Strain: Low Risk  (11/05/2022)  Physical Activity: Inactive (11/05/2022)  Social Connections: Moderately Integrated (11/05/2022)  Stress: No Stress Concern Present (11/05/2022)  Tobacco Use: Medium Risk (11/25/2022)     Readmission Risk Interventions    07/02/2022   12:34 PM  Readmission Risk Prevention Plan  Transportation Screening Complete  PCP or Specialist Appt within 5-7 Days Complete  Home Care Screening Complete  Medication Review (RN CM) Complete

## 2022-11-25 NOTE — Progress Notes (Signed)
   11/25/22 1206  Vitals  Temp 98.1 F (36.7 C)  Temp Source Oral  BP (!) 170/79  BP Location Left Arm  BP Method Automatic  Patient Position (if appropriate) Lying  Pulse Rate 80  Pulse Rate Source Monitor  Resp 20  Level of Consciousness  Level of Consciousness Alert  MEWS COLOR  MEWS Score Color Green  Oxygen Therapy  SpO2 99 %  O2 Device Room Air  Patient Activity (if Appropriate) In bed  Pulse Oximetry Type Intermittent  MEWS Score  MEWS Temp 0  MEWS Systolic 0  MEWS Pulse 0  MEWS RR 0  MEWS LOC 0  MEWS Score 0

## 2022-11-25 NOTE — Progress Notes (Signed)
   11/25/22 1115  Provider Notification  Provider Name/Title Dr Shon Hale  Date Provider Notified 11/25/22  Time Provider Notified 1115  Method of Notification Page  Notification Reason Critical Result (K+ 2.7)  Date Critical Result Received 11/25/22  Time Critical Result Received 1111  Provider response  (just paged, awaiting response)
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
## 2022-11-25 NOTE — Discharge Summary (Signed)
[name], is a 87 y.o. female  DOB 1936-03-25  MRN [id number].  Admission date:  11/24/2022  Admitting Physician  Frankey Shown, DO  Discharge Date:  11/25/2022   Primary MD  [name], MD  Recommendations for primary care physician for things to follow:   1)Repeat CBC and BMP --on Thursday 11/27/22  Admission Diagnosis  Hypokalemia [E87.6]   Discharge Diagnosis  Hypokalemia [E87.6]    Principal Problem:   Acute metabolic encephalopathy Active Problems:   COPD (chronic obstructive pulmonary disease)   Chronic respiratory failure with hypoxia   Anxiety   Persistent atrial fibrillation   Sinus node dysfunction   Hypokalemia   Chronic diastolic heart failure   Essential hypertension   CAD (coronary artery disease)   Frequent falls   Dehydration   Failure to thrive in adult      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    Essential hypertension    GERD (gastroesophageal reflux disease)    H/O hiatal hernia    Headache(784.0)    HOH (hard of hearing)    Hyperlipidemia    Neuromuscular disorder    Tremors   Pericardial effusion    Hemorrhagic    Presence of permanent cardiac pacemaker    Tachycardia-bradycardia syndrome    s/p Medtronic Adapta L dual chamber device  5/10    Past Surgical History:  Procedure Laterality Date   APPENDECTOMY     BIOPSY  02/24/2022   Procedure: BIOPSY;  Surgeon: Lemar Lofty., MD;  Location: [facility];  Service: Gastroenterology;;   CARDIAC CATHETERIZATION  2010   stents x2.   CATARACT EXTRACTION     CHOLECYSTECTOMY     COLONOSCOPY  2011   COLONOSCOPY N/A 10/19/2014   Procedure:  COLONOSCOPY;  Surgeon: Malissa Hippo, MD;  Location: [facility];  Service: Endoscopy;  Laterality: N/A;  1030   CORONARY STENT INTERVENTION N/A 05/28/2021   Procedure: CORONARY STENT INTERVENTION;  Surgeon: Marykay Lex, MD;  Location: [facility] INVASIVE CV LAB;  Service: Cardiovascular;  Laterality: N/A;   CORONARY ULTRASOUND/IVUS N/A 05/28/2021   Procedure: Intravascular Ultrasound/IVUS;  Surgeon: Marykay Lex, MD;  Location: [facility] INVASIVE CV LAB;  Service: Cardiovascular;  Laterality: N/A;   ESOPHAGEAL DILATION N/A 05/02/2020   Procedure: ESOPHAGEAL DILATION;  Surgeon: Malissa Hippo, MD;  [facility];  Service: Gastroenterology;  Laterality: N/A;   ESOPHAGOGASTRODUODENOSCOPY N/A 10/26/2014   Procedure: ESOPHAGOGASTRODUODENOSCOPY (EGD);  Surgeon: Malissa Hippo, MD;  [facility];  Service: Endoscopy;  Laterality: N/A;  230 - Dr. has lunch and learn   ESOPHAGOGASTRODUODENOSCOPY N/A 02/24/2022   Procedure: ESOPHAGOGASTRODUODENOSCOPY (EGD);  Surgeon: Lemar Lofty., MD;  Location: [facility];  Service: Gastroenterology;  Laterality: N/A;   ESOPHAGOGASTRODUODENOSCOPY (EGD) WITH PROPOFOL N/A 05/02/2020   Procedure: ESOPHAGOGASTRODUODENOSCOPY (EGD) WITH PROPOFOL;  Surgeon: Malissa Hippo, MD;  [facility];  Service: Gastroenterology;  Laterality: N/A;  250   Esophagogastroduodenoscopy with esophageal dilation  2004, 2006, 2007   EUS N/A 02/24/2022   Procedure: UPPER ENDOSCOPIC ULTRASOUND (EUS) LINEAR;  Surgeon: Lemar Lofty., MD;  Location: [facility];  Service: Gastroenterology;  Laterality: N/A;   FINE NEEDLE ASPIRATION  02/24/2022   Procedure: FINE NEEDLE ASPIRATION;  Surgeon: Meridee Score Netty Starring., MD;  Location: [facility]OS[facility];  Service: Gastroenterology;;  red path sent   GIVENS CAPSULE STUDY N/A 10/31/2014   Procedure: GIVENS CAPSULE STUDY;  Surgeon: Malissa Hippo, MD;  [facility];  Service: Endoscopy;  Laterality:  N/A;  730 -- pacemaker--needs monitoring--outpatient bed   INSERT / REPLACE / REMOVE PACEMAKER  2010   LEFT HEART CATH AND CORONARY ANGIOGRAPHY N/A 03/21/2021   Procedure: LEFT HEART CATH AND CORONARY ANGIOGRAPHY;  Surgeon: Lyn Records, MD;  Location: [facility] INVASIVE CV LAB;  Service: Cardiovascular;  Laterality: N/A;   LEFT HEART CATHETERIZATION WITH CORONARY ANGIOGRAM N/A 11/17/2011   Procedure: LEFT HEART CATHETERIZATION WITH CORONARY ANGIOGRAM;  Surgeon: Tonny Bollman, MD;  Location: [facility] CATH LAB;  Service: Cardiovascular;  Laterality: N/A;   LEFT HEART CATHETERIZATION WITH CORONARY ANGIOGRAM N/A 09/26/2014   Procedure: LEFT HEART CATHETERIZATION WITH CORONARY ANGIOGRAM;  Surgeon: Marykay Lex, MD;  Location: [facility] CATH LAB;  Service: Cardiovascular;  Laterality: N/A;   PPM GENERATOR CHANGEOUT N/A 07/16/2022   Procedure: PPM GENERATOR CHANGEOUT;  Surgeon: Marinus Maw, MD;  Location: [facility] INVASIVE CV LAB;  Service: Cardiovascular;  Laterality: N/A;   Right rotator cuff repair     SAVORY DILATION N/A 02/24/2022   Procedure: SAVORY DILATION;  Surgeon: Meridee Score Netty Starring., MD;  Location: [facility];  Service: Gastroenterology;  Laterality: N/A;   SHOULDER ACROMIOPLASTY Right 05/30/2015   Procedure: RIGHT SHOULDER ACROMIOPLASTY;  Surgeon: Vickki Hearing, MD;  Location: [facility];  Service: Orthopedics;  Laterality: Right;   SHOULDER OPEN ROTATOR CUFF REPAIR Right 05/30/2015   Procedure: OPEN ROTATOR CUFF REPAIR RIGHT SHOULDER;  Surgeon: Vickki Hearing, MD;  Location: [facility];  Service: Orthopedics;  Laterality: Right;   SHOULDER OPEN ROTATOR CUFF REPAIR Left 10/22/2016   Procedure: ROTATOR CUFF REPAIR SHOULDER OPEN;  Surgeon: Vickki Hearing, MD;  Location: [facility];  Service: Orthopedics;  Laterality: Left;   Subxiphoid pericardial window  11/10   VAGINAL HYSTERECTOMY       HPI  from the history and physical done on the day of admission:      HPI: [name] is a 87 y.o. female  with medical history significant of  chronic respiratory failure on supplemental oxygen at 2 LPM at night, COPD, atrial fibrillation, CHF, suspected pancreatic cancer, tachycardia-bradycardia syndrome s/p pacemaker, CAD who presented to the emergency department due to generalized weakness and confusion.  Patient was unable to provide history possibly due to underlying confusion, history was obtained from ED physician and ED medical record.  Per report, patient was seen at a church confused, it appeared that she drove to the church, she was seen by a bystander who called [facility] that took her to the ER.  [facility] was contacted prior to patient being taken to the ED.  Granddaughter was contacted by [employer] and she states that she was incapable of being able to take care of patient  anymore. Patient was recently admitted from 4/3 to 4/10 due to acute metabolic encephalopathy and hypokalemia   ED Course:  In the emergency department, BP was 146/93 and other vital signs are within normal range when she arrived to the ED.  Workup in the ED showed normal CBC, BMP showed sodium of 132, potassium 2.7, chloride 99, bicarb 21, blood glucose 131, BUN 18, creatinine 0.86.  Urinalysis was positive for glycosuria and trace leukocytes, urine drug screen was normal, lipase 32. Chest x-ray showed cardiac enlargement with mild interstitial changes, possibly fibrosis or edema no change since prior study CT abdomen and pelvis with contrast showed no acute process demonstrated in the abdominal pelvis.  No evidence of bowel obstruction or inflammation.  Calcified granulomas in the liver and spleen.  Diffuse pancreatic ductal dilatation, unchanged since prior study, likely chronic pancreatitis.  No acute changes CT head without contrast showed no acute intracranial abnormalities.  Chronic atrophy and small vessel ischemic changes. Potassium was replenished, IV NS 500 ml was given.  Hospitalist was asked to admit patient for further evaluation  and management.     Review of Systems: Review of systems as noted in the HPI. All other systems reviewed and are negative.     Hospital Course:     Brief Summary:- 87 y.o. female with medical history significant of  chronic hypoxic respiratory failure on supplemental oxygen at 2 LPM at night, COPD, atrial fibrillation, CHF, suspected pancreatic cancer, tachycardia-bradycardia syndrome s/p pacemaker, CAD admitted on 11/25/22 with acute metabolic cephalopathy superimposed on underlying dementia/cognitive decline as well as hypokalemia   A/p 1)Hypokalemia-- potassium is 2.7 Mag 2.0, phosphorous is 2.6, creatinine is 0.66 -Replaced -No vomiting or diarrhea   2) social/ethics--- patient was discharged to [facility] on 11/11/2022, but did not leave until 11/12/22 she was actually discharged from [facility] on 11/24/22, against all medical advice, and returned home to live alone. She got in her car and drove until she ran out of gas, which was near the church parking lot. [employer] is involved. Granddaughter, [name] is in the process of pursuing guardianship, with hearing on May 1st.  -So for now patient's granddaughter [name] is acting as interim guardianship    3)Pancreatic mass---Patient has a history of mixed type IPMN.  She was initially seen on 12/03/2021 by [name], and she was found  to have a growing pancreatic cystic lesion with associated abdominal pain.  Due to these she was referred for endoscopic ultrasound.  Underwent endoscopic ultrasound with [name] on 02/24/2022 which found Anechoic lesions suggestive of three cysts were identified in the pancreatic head.  Cytology showed an acellular specimen.  The case was ultimately discussed at multidisciplinary tumor board.   They considered that she was presenting changes concerning for a mixed branch duct/main duct IPMN.  It was considered that given her medical comorbidities she would not be the best surgical  candidate but she will continue monitoring. They  decided that she should have a repeat CT pancreas protocol in 6 months.  Depending on the findings on imaging, may need to discuss again the benefits versus risk of undergoing surgical resection.  patient underwent a CT of the abdomen with and without IV contrast on 05/01/2022 which showed decrease size of the cystic lesions of the pancreatic head and uncinate process measuring up to 15 mm.  She was advised to have a repeat CT pancreas protocol in 6 months.  She was last seen in the GI  office on 09/25/2022 at which time there were plans for repeat imaging later this year.  -CA 19-9 was noted to be normal at 14 back in February 2024.    4)H/o Persistent A-fib/Tachybradycardia Syndrome-- -Status post PPM -Generator exchanged 07/15/2022 -Follow-up Dr. Sharrell Ku -continue amiodarone for rate control -Patient declined anticoagulation previously   5) chronic hypoxic respiratory failure--- continue PTA oxygen requirement of 2 L via nasal cannula mostly nightly   6)Chronic diastolic CHF -Clinically euvolemic -Echo from 06/30/2022 with EF of 55 to 60%, no regional wall motion abnormalities    7)Coronary artery disease -No chest pain presently -Reviewed Dr. Lubertha Basque 07/15/22 note--patient has been on Brilinta in the past>> stopped temporarily for generator exchange on her pacemaker -Reviewed Dr. Ival Bible 06/16/2022 note--Brilinta reduced to 60 mg twice daily -Continue Ranexa, imdur, Crestor, Brilinta and metoprolol succinate   8) dementia/cognitive decline and anxiety disorder---.Xanax as needed -Patient with poor decision-making abilities -Pt lacks the capacity to make informed decisions for herself   Discharge Condition: Stable  Follow UP--PCP  Diet and Activity recommendation:  As advised  Discharge Instructions    Discharge Instructions     Call MD for:  difficulty breathing, headache or visual disturbances   Complete by: As directed     Call MD for:  persistant dizziness or light-headedness   Complete by: As directed    Call MD for:  persistant nausea and vomiting   Complete by: As directed    Call MD for:  temperature >100.4   Complete by: As directed    Diet - low sodium heart healthy   Complete by: As directed    Discharge instructions   Complete by: As directed    1)Repeat CBC and BMP --on Thursday 11/27/22   Increase activity slowly   Complete by: As directed        Discharge Medications     Allergies as of 11/25/2022       Reactions   Amitriptyline Hcl Other (See Comments)   Caused "jaws to twist and lock"   Plavix [clopidogrel Bisulfate] Hives   Sulfonamide Derivatives Other (See Comments)   UNKNOWN REACTION        Medication List     TAKE these medications    acetaminophen 325 MG tablet Commonly known as: TYLENOL Take 2 tablets (650 mg total) by mouth every 6 (six) hours as needed for mild pain (or Fever >/= 101).   ALPRAZolam 1 MG tablet Commonly known as: XANAX Take 1 tablet (1 mg total) by mouth 2 (two) times daily as needed for anxiety. What changed: when to take this   amiodarone 200 MG tablet Commonly known as: PACERONE TAKE 1 TABLET ONCE DAILY ON MONDAY THROUGH SATURDAY, NONE ON SUNDAY. What changed: See the new instructions.   aspirin EC 81 MG tablet Take 1 tablet (81 mg total) by mouth daily with breakfast. Swallow whole.   folic acid 1 MG tablet Commonly known as: FOLVITE Take 1 tablet (1 mg total) by mouth daily.   isosorbide mononitrate 30 MG 24 hr tablet Commonly known as: IMDUR Take 1 tablet (30 mg total) by mouth daily.   Jardiance 25 MG Tabs tablet Generic drug: empagliflozin Take 25 mg by mouth daily.   metoprolol succinate 50 MG 24 hr tablet Commonly known as: TOPROL-XL Take 1 tablet (50 mg total) by mouth 2 (two) times daily. Take with or immediately following a meal.   multivitamin with minerals Tabs tablet Take 1 tablet by mouth daily.  nitroGLYCERIN 0.4 MG SL tablet Commonly known as: NITROSTAT Place 1 tablet (0.4 mg total) under the tongue every 5 (five) minutes x 3 doses as needed for chest pain.   ondansetron 4 MG tablet Commonly known as: ZOFRAN Take 4 mg by mouth every 6 (six) hours. What changed: Another medication with the same name was added. Make sure you understand how and when to take each.   ondansetron 4 MG tablet Commonly known as: Zofran Take 1 tablet (4 mg total) by mouth daily as needed for nausea or vomiting. What changed: You were already taking a medication with the same name, and this prescription was added. Make sure you understand how and when to take each.   OXYGEN Inhale 2 L into the lungs as needed (PRN as needed at night).   Potassium Chloride ER 20 MEQ Tbcr Take 1 tablet (20 mEq total) by mouth daily for 4 days. 1 tab daily by mouth   ProAir HFA 108 (90 Base) MCG/ACT inhaler Generic drug: albuterol Inhale 1 puff into the lungs every 4 (four) hours as needed for wheezing or shortness of breath.   albuterol (2.5 MG/3ML) 0.083% nebulizer solution Commonly known as: PROVENTIL Take 3 mLs (2.5 mg total) by nebulization every 4 (four) hours as needed for wheezing or shortness of breath.   ranolazine 500 MG 12 hr tablet Commonly known as: RANEXA Take 1 tablet (500 mg total) by mouth 2 (two) times daily.   rosuvastatin 40 MG tablet Commonly known as: Crestor Take 1 tablet (40 mg total) by mouth daily.   senna-docusate 8.6-50 MG tablet Commonly known as: Senokot-S Take 2 tablets by mouth at bedtime. What changed: how much to take   ticagrelor 60 MG Tabs tablet Commonly known as: BRILINTA Take 60 mg by mouth 2 (two) times daily.   triamcinolone cream 0.1 % Commonly known as: KENALOG Apply 1 Application topically daily as needed (for rash/skin irritation).   Vitamin B-12 5000 MCG Subl Take 5,000 mcg by mouth daily.   vitamin E 180 MG (400 UNITS) capsule Generic drug: vitamin  E Take 400 Units by mouth daily.        Major procedures and Radiology Reports - PLEASE review detailed and final reports for all details, in brief -   DG Chest Port 1 View  Result Date: 11/24/2022 CLINICAL DATA:  Confusion. EXAM: PORTABLE CHEST 1 VIEW COMPARISON:  11/05/2022 FINDINGS: Cardiac pacemaker. Cardiac enlargement with mild central vascular congestion. Interstitial changes may represent fibrosis or mild edema. Appearances are similar to prior study. No pleural effusions. No pneumothorax. No focal consolidation. Mediastinal contours appear intact. Calcified and tortuous aorta. IMPRESSION: Cardiac enlargement with mild interstitial changes, possibly fibrosis or edema. No change since prior study. Electronically Signed   By: Burman Nieves M.D.   On: 11/24/2022 23:00   CT ABDOMEN PELVIS W CONTRAST  Result Date: 11/24/2022 CLINICAL DATA:  Acute nonlocalized abdominal pain. Patient [place] church confused. EXAM: CT ABDOMEN AND PELVIS WITH CONTRAST TECHNIQUE: Multidetector CT imaging of the abdomen and pelvis was performed using the standard protocol following bolus administration of intravenous contrast. RADIATION DOSE REDUCTION: This exam was performed according to the departmental dose-optimization program which includes automated exposure control, adjustment of the mA and/or kV according to patient size and/or use of iterative reconstruction technique. CONTRAST:  OMNIPAQUE IOHEXOL 300 MG/ML  SOLN COMPARISON:  11/01/2022 FINDINGS: Lower chest: Lung bases are clear. Hepatobiliary: Surgical absence of the gallbladder. No bile duct dilatation. Multiple tiny calcifications throughout  the liver, likely calcified granulomas. No focal lesions. Pancreas: Diffuse pancreatic ductal dilatation likely resulting from chronic pancreatitis. Similar appearance to previous study. No acute inflammatory changes or mass lesion identified. Spleen: Calcifications in the spleen, likely granulomas.  Adrenals/Urinary Tract: Adrenal glands are unremarkable. Kidneys are normal, without renal calculi, focal lesion, or hydronephrosis. Bladder is unremarkable. Stomach/Bowel: Stomach, small bowel, and colon are not abnormally distended. Scattered stool in the colon. No wall thickening or inflammatory changes. Appendix is not identified. Vascular/Lymphatic: Aortic atherosclerosis. No enlarged abdominal or pelvic lymph nodes. Reproductive: No pelvic masses. Other: No abdominal wall hernia or abnormality. No abdominopelvic ascites. Musculoskeletal: Degenerative changes in the spine. IMPRESSION: 1. No acute process demonstrated in the abdomen or pelvis. No evidence of bowel obstruction or inflammation. 2. Calcified granulomas in the liver and spleen. 3. Diffuse pancreatic ductal dilatation, unchanged since prior study, likely chronic pancreatitis. No acute changes. 4. Aortic atherosclerosis. Electronically Signed   By: Burman Nieves M.D.   On: 11/24/2022 22:58   CT Head Wo Contrast  Result Date: 11/24/2022 CLINICAL DATA:  Acute neurological deficit with stroke suspected. Patient was [place] church confused. EXAM: CT HEAD WITHOUT CONTRAST TECHNIQUE: Contiguous axial images were obtained from the base of the skull through the vertex without intravenous contrast. RADIATION DOSE REDUCTION: This exam was performed according to the departmental dose-optimization program which includes automated exposure control, adjustment of the mA and/or kV according to patient size and/or use of iterative reconstruction technique. COMPARISON:  11/06/2022 FINDINGS: Brain: Diffuse cerebral atrophy. Ventricular dilatation consistent with central atrophy. Low-attenuation changes in the deep white matter consistent with small vessel ischemia. No abnormal extra-axial fluid collections. No mass effect or midline shift. Gray-white matter junctions are distinct. Basal cisterns are not effaced. No acute intracranial hemorrhage. Vascular: No  hyperdense vessel or unexpected calcification. Skull: Normal. Negative for fracture or focal lesion. Sinuses/Orbits: No acute finding. Other: None. IMPRESSION: No acute intracranial abnormalities. Chronic atrophy and small vessel ischemic changes. Electronically Signed   By: Burman Nieves M.D.   On: 11/24/2022 22:54   CT HEAD WO CONTRAST ( )  Result Date: 11/06/2022 CLINICAL DATA:  Neuro deficit, acute, stroke suspected. Altered mental status. EXAM: CT HEAD WITHOUT CONTRAST TECHNIQUE: Contiguous axial images were obtained from the base of the skull through the vertex without intravenous contrast. RADIATION DOSE REDUCTION: This exam was performed according to the departmental dose-optimization program which includes automated exposure control, adjustment of the mA and/or kV according to patient size and/or use of iterative reconstruction technique. COMPARISON:  Head CT 11/05/2022. FINDINGS: Brain: No acute intracranial hemorrhage. Unchanged mild chronic small-vessel disease with old lacunar infarct in the left lentiform nucleus. Gray-white differentiation is otherwise preserved. No hydrocephalus or extra-axial collection. No mass effect or midline shift. Vascular: No hyperdense vessel or unexpected calcification. Skull: No calvarial fracture or suspicious bone lesion. Skull base is unremarkable. Sinuses/Orbits: Unremarkable. Other: None. IMPRESSION: 1. No acute intracranial process. 2. Unchanged mild chronic small-vessel disease. Electronically Signed   By: Orvan Falconer M.D.   On: 11/06/2022 08:27   DG Chest Port 1 View  Result Date: 11/05/2022 CLINICAL DATA:  Weakness, coronary artery disease, COPD EXAM: PORTABLE CHEST 1 VIEW COMPARISON:  11/01/2022 FINDINGS: The lungs are symmetrically well expanded. No pneumothorax or pleural effusion. Mild progressive central pulmonary vascular congestion and trace diffuse interstitial pulmonary edema, likely cardiogenic in nature, are new since prior examination.  Cardiac size is within normal limits. Tortuosity and ectasia of the thoracic aorta is noted, better assessed on  CT examination of 11/01/2022. Left subclavian dual lead pacemaker is unchanged. IMPRESSION: 1. Developing, mild cardiogenic failure. 2. Tortuosity and ectasia of the thoracic aorta. Electronically Signed   By: Helyn Numbers M.D.   On: 11/05/2022 20:04   CT ANGIO HEAD NECK W WO CM W PERF (CODE STROKE)  Result Date: 11/05/2022 CLINICAL DATA:  Neuro deficit with acute stroke suspected. Right-sided weakness and facial droop EXAM: CT ANGIOGRAPHY HEAD AND NECK CT PERFUSION BRAIN TECHNIQUE: Multidetector CT imaging of the head and neck was performed using the standard protocol during bolus administration of intravenous contrast. Multiplanar CT image reconstructions and MIPs were obtained to evaluate the vascular anatomy. Carotid stenosis measurements (when applicable) are obtained utilizing NASCET criteria, using the distal internal carotid diameter as the denominator. Multiphase CT imaging of the brain was performed following IV bolus contrast injection. Subsequent parametric perfusion maps were calculated using RAPID software. RADIATION DOSE REDUCTION: This exam was performed according to the departmental dose-optimization program which includes automated exposure control, adjustment of the mA and/or kV according to patient size and/or use of iterative reconstruction technique. CONTRAST:  OMNIPAQUE IOHEXOL 350 MG/ML SOLN COMPARISON:  Head CT from earlier today. FINDINGS: Aortic arch: Atheromatous plaque.  Three vessel branching Right carotid system: Atheromatous plaque at the bifurcation. High-grade stenosis is suspected although there is limited quantification due to more shin artifact. Narrowing is over 50%. Left carotid system: Atheromatous plaque at the common carotid origin and at the bifurcation without flow reducing stenosis or ulceration. Vertebral arteries: Calcified plaque at the proximal  right subclavian causes 70% stenosis as measured on coronal reformats. Advanced narrowing at the right vertebral origin due to atheromatous plaque. Moderate narrowing at the left vertebral origin. No dissection or beading. Skeleton: Ordinary cervical spine degeneration Other neck: No acute finding Upper chest: Emphysema. Review of the MIP images confirms the above findings CTA HEAD FINDINGS Anterior circulation: Limited by motion. Extensive atheromatous calcification of the cavernous carotids. There may be significant narrowing at the right MCA bifurcation, although degraded by motion. No emergent large vessel occlusion Posterior circulation: Right dominant vertebral artery. Before atheromatous plaque with high-grade narrowing affecting the right more than left vertebral artery. Moderately extensive atheromatous irregularity of the basilar. Severe left P1 segment stenosis with downstream underfilling. Bilateral PCA atheromatous irregularity is extensive. Venous sinuses: Unremarkable. CT Brain Perfusion Findings: ASPECTS: 10 CBF (<30%) Volume: 0mL Perfusion (Tmax>6.0s) volume: 3mL There is notable motion artifact. IMPRESSION: 1. Limited study due to motion artifact. 2. Severe narrowing at the left P1 segment. On coronal thick MIPS there is question of luminal filling defect but given the degree of motion this may instead be atheromatous. 3. Right ICA origin stenosis that is likely flow reducing with possible downstream underfilling. 4. 70% narrowing at the right subclavian origin. 5. High-grade narrowing of the right vertebral artery at its origin and vertebrobasilar junction. Advanced narrowing of the non dominant left V4 segment as well. 6. Moderate distal basilar stenosis. 7. Severe PCA atheromatous irregularity. 8. Noncontributory CT perfusion. Electronically Signed   By: Tiburcio Pea M.D.   On: 11/05/2022 19:26   CT Cervical Spine Wo Contrast  Result Date: 11/05/2022 CLINICAL DATA:  Trauma, multiple falls  EXAM: CT CERVICAL SPINE WITHOUT CONTRAST TECHNIQUE: Multidetector CT imaging of the cervical spine was performed without intravenous contrast. Multiplanar CT image reconstructions were also generated. RADIATION DOSE REDUCTION: This exam was performed according to the departmental dose-optimization program which includes automated exposure control, adjustment of the mA and/or kV according to  patient size and/or use of iterative reconstruction technique. COMPARISON:  11/01/2022 FINDINGS: There is patient motion in some of the images limiting the study. Alignment: Alignment of posterior margins of vertebral bodies is within normal limits. Skull base and vertebrae: No recent fracture is seen. Soft tissues and spinal canal: Posterior bony spurs causing mild extrinsic pressure on the ventral margin of thecal sac from C3 to C6 levels. Disc levels: There is minimal encroachment of right neural foramen at C2-C3 level. There is mild encroachment of neural foramina from C3-C7 levels. Upper chest: Linear patchy pleural densities are seen in both apices. Other: Thyroid is enlarged with inhomogeneous attenuation. IMPRESSION: Motion limited study. No recent fracture is seen in cervical spine. Cervical spondylosis with encroachment of neural foramina at multiple levels. There is mild extrinsic pressure over the ventral margin of thecal sac caused by posterior bony spurs without significant central spinal stenosis. Thyroid is enlarged with inhomogeneous attenuation. Nonemergent thyroid sonogram may be considered. Electronically Signed   By: Ernie Avena M.D.   On: 11/05/2022 19:17   CT HEAD CODE STROKE WO CONTRAST  Result Date: 11/05/2022 CLINICAL DATA:  Code stroke. Neuro deficit with acute stroke suspected EXAM: CT HEAD WITHOUT CONTRAST TECHNIQUE: Contiguous axial images were obtained from the base of the skull through the vertex without intravenous contrast. RADIATION DOSE REDUCTION: This exam was performed according to  the departmental dose-optimization program which includes automated exposure control, adjustment of the mA and/or kV according to patient size and/or use of iterative reconstruction technique. COMPARISON:  Head CT 11/01/2022 FINDINGS: Brain: No evidence of acute infarction, hemorrhage, hydrocephalus, extra-axial collection or mass lesion/mass effect. Chronic small vessel ischemia with chronic perforator infarct at the left corona radiata. Vascular: No hyperdense vessel or unexpected calcification. Skull: Normal. Negative for fracture or focal lesion. Sinuses/Orbits: No acute finding. Other: Prelim sent in epic chat. ASPECTS [facility] Stroke Program Early CT Score) Not scored without localizing history. IMPRESSION: No acute or interval finding. Electronically Signed   By: Tiburcio Pea M.D.   On: 11/05/2022 18:32   CT CHEST ABDOMEN PELVIS W CONTRAST  Result Date: 11/01/2022 CLINICAL DATA:  Unwitnessed fall.  Blunt poly trauma. EXAM: CT CHEST, ABDOMEN, AND PELVIS WITH CONTRAST TECHNIQUE: Multidetector CT imaging of the chest, abdomen and pelvis was performed following the standard protocol during bolus administration of intravenous contrast. RADIATION DOSE REDUCTION: This exam was performed according to the departmental dose-optimization program which includes automated exposure control, adjustment of the mA and/or kV according to patient size and/or use of iterative reconstruction technique. CONTRAST:  OMNIPAQUE IOHEXOL 300 MG/ML  SOLN COMPARISON:  06/29/2022 FINDINGS: CT CHEST FINDINGS Cardiovascular: No significant vascular findings. Normal heart size. No pericardial effusion. Dual-chamber pacer leads in unremarkable position. Atheromatous calcification of the aorta and coronaries. Mediastinum/Nodes: No hematoma or pneumomediastinum Lungs/Pleura: There is no edema, consolidation, effusion, or pneumothorax. Musculoskeletal: No acute finding CT ABDOMEN PELVIS FINDINGS Hepatobiliary: No hepatic injury or  perihepatic hematoma. Gallbladder is surgically absent Pancreas: Prominent main duct size (up to 3 mm) with multiple cystic densities in the head and body, measuring up to 9 mm at the uncinate process. MRCP was recommended 05/01/2022, follow-up to depend on comorbidities. No acute finding. Spleen: Coarse calcifications.  No visible injury Adrenals/Urinary Tract: Left renal cortical scarring. Stomach/Bowel: No evidence of injury Vascular/Lymphatic: No evidence of injury. Extensive atheromatous calcification. Reproductive: Hysterectomy. Other: No ascites or pneumoperitoneum Musculoskeletal: Generalized osteopenia. Chronic L1 inferior endplate fracture. Generalized osteopenia. No evidence of acute injury. IMPRESSION: 1. No  evidence of injury to the chest or abdomen. 2. Chronic findings, including cystic pancreas lesions, are stable from previous imaging and described above. Electronically Signed   By: Tiburcio Pea M.D.   On: 11/01/2022 06:22   DG Pelvis Portable  Result Date: 11/01/2022 CLINICAL DATA:  Fall EXAM: PORTABLE PELVIS 1 VIEWS COMPARISON:  10/07/2022 FINDINGS: There is no evidence of pelvic fracture or diastasis. No pelvic bone lesions are seen. Generalized osteopenia. IMPRESSION: Negative. Electronically Signed   By: Tiburcio Pea M.D.   On: 11/01/2022 05:29   CT HEAD WO CONTRAST ( )  Result Date: 11/01/2022 CLINICAL DATA:  Head trauma, minor.  Unwitnessed fall. EXAM: CT HEAD WITHOUT CONTRAST CT CERVICAL SPINE WITHOUT CONTRAST TECHNIQUE: Multidetector CT imaging of the head and cervical spine was performed following the standard protocol without intravenous contrast. Multiplanar CT image reconstructions of the cervical spine were also generated. RADIATION DOSE REDUCTION: This exam was performed according to the departmental dose-optimization program which includes automated exposure control, adjustment of the mA and/or kV according to patient size and/or use of iterative reconstruction  technique. COMPARISON:  Five days ago FINDINGS: CT HEAD FINDINGS Brain: No evidence of acute infarction, hemorrhage, hydrocephalus, extra-axial collection or mass lesion/mass effect. Chronic small vessel ischemia in the cerebral white matter with chronic perforator infarct at the left basal ganglia. Vascular: No hyperdense vessel or unexpected calcification. Skull: Normal. Negative for fracture or focal lesion. Sinuses/Orbits: No evidence of injury. CT CERVICAL SPINE FINDINGS Alignment: Normal. Skull base and vertebrae: No acute fracture. No primary bone lesion or focal pathologic process. Soft tissues and spinal canal: No prevertebral fluid or swelling. No visible canal hematoma. Disc levels:  Ordinary and generalized cervical spine degeneration Upper chest: Biapical scarring with no evidence of injury. IMPRESSION: No evidence of acute intracranial or cervical spine injury. Electronically Signed   By: Tiburcio Pea M.D.   On: 11/01/2022 05:28   CT CERVICAL SPINE WO CONTRAST  Result Date: 11/01/2022 CLINICAL DATA:  Head trauma, minor.  Unwitnessed fall. EXAM: CT HEAD WITHOUT CONTRAST CT CERVICAL SPINE WITHOUT CONTRAST TECHNIQUE: Multidetector CT imaging of the head and cervical spine was performed following the standard protocol without intravenous contrast. Multiplanar CT image reconstructions of the cervical spine were also generated. RADIATION DOSE REDUCTION: This exam was performed according to the departmental dose-optimization program which includes automated exposure control, adjustment of the mA and/or kV according to patient size and/or use of iterative reconstruction technique. COMPARISON:  Five days ago FINDINGS: CT HEAD FINDINGS Brain: No evidence of acute infarction, hemorrhage, hydrocephalus, extra-axial collection or mass lesion/mass effect. Chronic small vessel ischemia in the cerebral white matter with chronic perforator infarct at the left basal ganglia. Vascular: No hyperdense vessel or  unexpected calcification. Skull: Normal. Negative for fracture or focal lesion. Sinuses/Orbits: No evidence of injury. CT CERVICAL SPINE FINDINGS Alignment: Normal. Skull base and vertebrae: No acute fracture. No primary bone lesion or focal pathologic process. Soft tissues and spinal canal: No prevertebral fluid or swelling. No visible canal hematoma. Disc levels:  Ordinary and generalized cervical spine degeneration Upper chest: Biapical scarring with no evidence of injury. IMPRESSION: No evidence of acute intracranial or cervical spine injury. Electronically Signed   By: Tiburcio Pea M.D.   On: 11/01/2022 05:28   DG Chest Port 1 View  Result Date: 11/01/2022 CLINICAL DATA:  87 year old female with possible sepsis. EXAM: PORTABLE CHEST 1 VIEW COMPARISON:  Chest x-ray 10/27/2022. FINDINGS: Lung volumes are very low. Low opacity at the left base favored  to reflect small left pleural effusion with associated passive subsegmental atelectasis in the left lower lobe. No definite consolidative airspace disease. No right pleural effusion. No pneumothorax. No evidence of pulmonary edema. Heart size is normal. The patient is severely rotated to the left on today's exam, resulting in distortion of the mediastinal contours and reduced diagnostic sensitivity and specificity for mediastinal pathology. Atherosclerotic calcifications in the thoracic aorta. Left-sided pacemaker device in place with lead tips projecting over the expected location of the right atrium and right ventricle. Soft tissue anchor in the right humeral head incidentally noted. IMPRESSION: 1. Low lung volumes with small left pleural effusion and probable subsegmental atelectasis in the left lung base. 2. Aortic atherosclerosis. Electronically Signed   By: Trudie Reed M.D.   On: 11/01/2022 05:26   DG Chest 2 View  Result Date: 10/27/2022 CLINICAL DATA:  Altered mental status EXAM: CHEST - 2 VIEW COMPARISON:  06/29/2022 FINDINGS: Left-sided  pacing device as before. Cardiomegaly. Heterogeneous ground-glass opacities in the right upper lobe and left mid to lower lung. Aortic atherosclerosis. No pneumothorax. IMPRESSION: Heterogeneous ground-glass opacities in the right upper lobe and left mid to lower lung, suspect for multifocal pneumonia. Cardiomegaly. Electronically Signed   By: Jasmine Pang M.D.   On: 10/27/2022 21:58   CT Head Wo Contrast  Result Date: 10/27/2022 CLINICAL DATA:  Altered mental status EXAM: CT HEAD WITHOUT CONTRAST TECHNIQUE: Contiguous axial images were obtained from the base of the skull through the vertex without intravenous contrast. RADIATION DOSE REDUCTION: This exam was performed according to the departmental dose-optimization program which includes automated exposure control, adjustment of the mA and/or kV according to patient size and/or use of iterative reconstruction technique. COMPARISON:  CT brain 12/11/2021 FINDINGS: Brain: No acute territorial infarction, hemorrhage or intracranial mass. Probable chronic lacunar infarct left basal ganglia. Patchy white matter hypodensity consistent with chronic small vessel ischemic change. Mild atrophy. Nonenlarged ventricles Vascular: No hyperdense vessels.  Carotid vascular calcification Skull: Normal. Negative for fracture or focal lesion. Sinuses/Orbits: No acute finding. Other: None IMPRESSION: 1. No CT evidence for acute intracranial abnormality. 2. Atrophy and chronic small vessel ischemic changes of the white matter. Electronically Signed   By: Jasmine Pang M.D.   On: 10/27/2022 21:32    Today   Subjective    [name] today has no new complaints  No fever  Or chills   No Nausea, Vomiting or Diarrhea -No chest pains no palpitations no dizziness -Patient is at times not very cooperative, refusing medications from time to time -Obvious cognitive and memory deficits noted            Patient has been seen and examined prior to discharge   Objective    Blood pressure (!) 153/81, pulse 88, temperature 97.8 F (36.6 C), temperature source Oral, resp. rate (!) 24, height 5\' 5"  (1.651 m), weight 64.6 kg, SpO2 98 %.   Intake/Output Summary (Last 24 hours) at 11/25/2022 1639 Last data filed at 11/25/2022 1356 Gross per 24 hour  Intake 240 ml  Output 350 ml  Net -110 ml    Exam Gen:- Awake Alert, no acute distress  HEENT:- [place].AT, No sclera icterus Neck-Supple Neck,No JVD,.  Lungs-  CTAB , good air movement bilaterally CV- S1, S2 normal, regular Abd-  +ve B.Sounds, Abd Soft, No tenderness,    Extremity/Skin:- No  edema,   good pulses Psych--Patient is at times not very cooperative, refusing medications from time to time -Obvious cognitive and memory deficits noted  Neuro-no new focal deficits, no tremors    Data Review   CBC w Diff:  Lab Results  Component Value Date   WBC 9.3 11/25/2022   HGB 13.4 11/25/2022   HCT 40.5 11/25/2022   PLT 197 11/25/2022   LYMPHOPCT 8 11/24/2022   MONOPCT 9 11/24/2022   EOSPCT 0 11/24/2022   BASOPCT 0 11/24/2022    CMP:  Lab Results  Component Value Date   NA 137 11/25/2022   K 2.7 (LL) 11/25/2022   CL 103 11/25/2022   CO2 24 11/25/2022   BUN 12 11/25/2022   CREATININE 0.66 11/25/2022   CREATININE 0.62 04/08/2016   PROT 6.8 11/25/2022   ALBUMIN 3.5 11/25/2022   BILITOT 1.5 (H) 11/25/2022   ALKPHOS 77 11/25/2022   AST 30 11/25/2022   ALT 17 11/25/2022  .  Total Discharge time is about 33 minutes  Shon Hale M.D on 11/25/2022 at 4:39 PM  Go to [email or web address] -  for contact info  [facility] - Office  [phone]
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
## 2022-11-25 NOTE — Progress Notes (Signed)
Patient seen and evaluated, chart reviewed, please see EMR for updated orders. Please see full H&P dictated by admitting physician Dr. Thomes Dinning for same date of service.  - Brief Summary:- 87 y.o. female with medical history significant of  chronic hypoxic respiratory failure on supplemental oxygen at 2 LPM at night, COPD, atrial fibrillation, CHF, suspected pancreatic cancer, tachycardia-bradycardia syndrome s/p pacemaker, CAD admitted on 11/25/22 with acute metabolic cephalopathy superimposed on underlying dementia/cognitive decline as well as hypokalemia  A/p 1)Hypokalemia-- potassium is 2.7 Mag 2.0 -Replace and recheck -No vomiting or diarrhea  2) social/ethics--- patient was discharged to [facility] on 11/11/2022, but did not leave until 11/12/22 she was actually discharged from [facility] on 11/24/22, against all medical advice, and returned home to live alone. She got in her car and drove until she ran out of gas, which was near the church parking lot. APS is involved. Granddaughter, [name] is in the process of pursuing guardianship, with hearing on May 1st.  -So for now patient's granddaughter [name] is acting as interim guardianship   3)Pancreatic mass---Patient has a history of mixed type IPMN.  She was initially seen on 12/03/2021 by [name], and she was found  to have a growing pancreatic cystic lesion with associated abdominal pain.  Due to these she was referred for endoscopic ultrasound.  Underwent endoscopic ultrasound with [name] on 02/24/2022 which found Anechoic lesions suggestive of three cysts were identified in the pancreatic head.  Cytology showed an acellular specimen.  The case was ultimately discussed at multidisciplinary tumor board.   They considered that she was presenting changes concerning for a mixed branch duct/main duct IPMN.  It was considered that given her medical comorbidities she would not be the best surgical candidate but she will  continue monitoring. They  decided that she should have a repeat CT pancreas protocol in 6 months.  Depending on the findings on imaging, may need to discuss again the benefits versus risk of undergoing surgical resection.  patient underwent a CT of the abdomen with and without IV contrast on 05/01/2022 which showed decrease size of the cystic lesions of the pancreatic head and uncinate process measuring up to 15 mm.  She was advised to have a repeat CT pancreas protocol in 6 months.  She was last seen in the GI office on 09/25/2022 at which time there were plans for repeat imaging later this year.  -CA 19-9 was noted to be normal at 14 back in February 2024.   4)H/o Persistent A-fib/Tachybradycardia Syndrome-- -Status post PPM -Generator exchanged 07/15/2022 -Follow-up Dr. Sharrell Ku -continue amiodarone for rate control -Patient declined anticoagulation previously  5) chronic hypoxic respiratory failure--- continue PTA oxygen requirement of 2 L via nasal cannula mostly nightly  6)Chronic diastolic CHF -Clinically euvolemic -Echo from 06/30/2022 with EF of 55 to 60%, no regional wall motion abnormalities   7)Coronary artery disease -No chest pain presently -Reviewed Dr. Lubertha Basque 07/15/22 note--patient has been on Brilinta in the past>> stopped temporarily for generator exchange on her pacemaker -Reviewed Dr. Ival Bible 06/16/2022 note--Brilinta reduced to 60 mg twice daily -Continue Ranexa, imdur, Crestor, Brilinta and metoprolol succinate  8) dementia/cognitive decline and anxiety disorder---.Xanax as needed -Patient with poor decision-making abilities -Pt lacks the capacity to make informed decisions for herself  Total care time is 57 minutes--- including time spent coordinating care with social workers  Dispo--- anticipate discharge back to SNF facility  Shon Hale, MD

## 2022-11-25 NOTE — Progress Notes (Signed)
While in room speaking with pt and pt family member, this Clinical research associate witnessed pt start to have jerking like movements, lasted about 10 seconds, and pt immediately started speaking again. No intervention needed. Alert and oriented to self and dob, aware of her surrounding and reason she is currently in the hospital. Pt eating lunch at this time, bed in low position, call bell in reach. Plan of care ongoing.

## 2022-11-25 NOTE — NC FL2 (Signed)
[place] MEDICAID FL2 LEVEL OF CARE FORM     IDENTIFICATION  Patient Name: [name] Birthdate: Oct 28, 1935 Sex: female Admission Date (Current Location): 11/24/2022  [facility] and [place] Number:  [employer] and Address:  [facility],  [address] [place] [address]      Provider Number: [id number]  Attending Physician Name and Address:  Shon Hale, MD  Relative Name and Phone Number:       Current Level of Care: Hospital Recommended Level of Care: Skilled Nursing Facility Prior Approval Number:    Date Approved/Denied:   PASRR Number: [id number]  Discharge Plan: SNF    Current Diagnoses: Patient Active Problem List   Diagnosis Date Noted   Dehydration 11/25/2022   Failure to thrive in adult 11/25/2022   Acute metabolic encephalopathy 11/06/2022   Frequent falls 11/06/2022   Right sided weakness 11/05/2022   Paranoid delusion 10/28/2022   Current moderate episode of major depressive disorder 10/28/2022   Community acquired pneumonia 10/27/2022   Hardening of the aorta (main artery of the heart) 10/27/2022   Pruritic rash 10/27/2022   Generalized abdominal pain 09/29/2022   COPD (chronic obstructive pulmonary disease) 06/29/2022   Chronic respiratory failure with hypoxia 06/29/2022   Chronic systolic heart failure 06/29/2022   IPMN (intraductal papillary mucinous neoplasm) 06/09/2022   Constipation 06/09/2022   Atherosclerosis of coronary artery without angina pectoris 04/23/2022   Upper abdominal pain 12/03/2021   Hearing difficulty 06/19/2021   Neck pain 05/16/2021   CAD (coronary artery disease) 05/02/2021   Nausea 04/17/2021   Productive cough 04/17/2021   Chest pain 03/30/2021   COVID-19 virus infection 03/30/2021   Non-STEMI (non-ST elevated myocardial infarction)    Mass of pancreas 03/20/2021   Hypertensive urgency 03/19/2021   Elevated troponin 03/19/2021   Edema of lower extremity 01/14/2021   Impaired  fasting glucose 01/07/2021   Generalized anxiety disorder 01/07/2021   Essential tremor 01/07/2021   Proteinuria 01/07/2021   Essential hypertension 01/02/2021   Iron deficiency anemia 01/02/2021   Prediabetes 01/02/2021   Difficulty swallowing 07/03/2020   Nonspecific chest pain 09/15/2016   Complete rotator cuff tear of left shoulder    Anemia 10/31/2014   H/O heart artery stent 10/30/2014   Chest pain with moderate risk for cardiac etiology 08/23/2014   Diastolic dysfunction with chronic heart failure 08/23/2014   Chronic diastolic heart failure 08/23/2014   Abdominal pain 08/23/2014   Acquired obstruction of both nasolacrimal ducts 06/15/2014   Lower abdominal pain 01/27/2012   Hypokalemia 06/03/2011   PERICARDIAL EFFUSION 07/10/2009   Disease of pericardium 07/10/2009   Essential hypertension, benign 04/18/2009   Triple vessel disease of the heart 04/18/2009   Benign essential hypertension 04/18/2009   PPM-Medtronic 04/04/2009   Sinus node dysfunction 01/26/2009   HLD (hyperlipidemia) 12/19/2008   Anxiety 12/19/2008   Persistent atrial fibrillation 12/19/2008   GERD (gastroesophageal reflux disease) 12/19/2008   Abnormal involuntary movements 12/19/2008   Atrial fibrillation, chronic 12/19/2008   Mixed hyperlipidemia 12/19/2008    Orientation RESPIRATION BLADDER Height & Weight     Self  Normal Incontinent Weight: 142 lb 6.7 oz (64.6 kg) Height:   (165.1 cm)  BEHAVIORAL SYMPTOMS/MOOD NEUROLOGICAL BOWEL NUTRITION STATUS      Continent Diet (see dc summary)  AMBULATORY STATUS COMMUNICATION OF NEEDS Skin   Supervision Verbally Normal                       Personal Care  Assistance Level of Assistance    Bathing Assistance: Limited assistance Feeding assistance: Independent Dressing Assistance: Limited assistance     Functional Limitations Info    Sight Info: Impaired Hearing Info: Impaired Speech Info: Adequate    SPECIAL CARE FACTORS FREQUENCY                        Contractures Contractures Info: Not present    Additional Factors Info    Code Status Info: DNR Allergies Info: Amitriptyline Hcl, Plavix, Sulfonamide Derivatives           Current Medications (11/25/2022):  This is the current hospital active medication list Current Facility-Administered Medications  Medication Dose Route Frequency Provider Last Rate Last Admin   0.9 %  sodium chloride infusion   Intravenous Continuous Adefeso, Oladapo, DO       acetaminophen (TYLENOL) tablet 650 mg  650 mg Oral Q6H PRN Adefeso, Oladapo, DO       Or   acetaminophen (TYLENOL) suppository 650 mg  650 mg Rectal Q6H PRN Adefeso, Oladapo, DO       albuterol (PROVENTIL) (2.5 MG/3ML) 0.083% nebulizer solution 2.5 mg  2.5 mg Nebulization Q4H PRN Adefeso, Oladapo, DO       ALPRAZolam (XANAX) tablet 1 mg  1 mg Oral TID PRN Frankey Shown, DO       amiodarone (PACERONE) tablet 200 mg  200 mg Oral Once per day on Mon Tue Wed Thu Fri Sat Adefeso, Oladapo, DO       enoxaparin (LOVENOX) injection 40 mg  40 mg Subcutaneous Q24H Adefeso, Oladapo, DO       isosorbide mononitrate (IMDUR) 24 hr tablet 30 mg  30 mg Oral Daily Adefeso, Oladapo, DO       metoprolol succinate (TOPROL-XL) 24 hr tablet 50 mg  50 mg Oral BID Adefeso, Oladapo, DO       potassium chloride 10 mEq in 100 mL IVPB  10 mEq Intravenous Q1 Hr x 4 Emokpae, Courage, MD       potassium chloride SA (KLOR-CON M) CR tablet 40 mEq  40 mEq Oral Once Adefeso, Oladapo, DO       prochlorperazine (COMPAZINE) injection 10 mg  10 mg Intravenous Q6H PRN Adefeso, Oladapo, DO       ranolazine (RANEXA) 12 hr tablet 500 mg  500 mg Oral BID Adefeso, Oladapo, DO       rosuvastatin (CRESTOR) tablet 40 mg  40 mg Oral Daily Adefeso, Oladapo, DO       ticagrelor (BRILINTA) tablet 60 mg  60 mg Oral BID Frankey Shown, DO         Discharge Medications: Please see discharge summary for a list of discharge medications.  Relevant Imaging  Results:  Relevant Lab Results:   Additional Information SSN: [id number], LCSW

## 2022-11-25 NOTE — TOC Initial Note (Signed)
Transition of Care [facility]) - Initial/Assessment Note    Patient Details  Name: [name] MRN: [id number] Date of Birth: 08/05/1935  [facility]) CM/SW Contact:    Elliot Gault, LCSW Phone Number: 11/25/2022, 2:58 PM  Clinical Narrative:                  Pt known to [facility] from recent admission. During pt's recent hospitalization, Pt's granddaughter, April, was appointed interim guardian by the court due to pt's incapacity.  Pt discharged to [facility] for SNF rehab after her last stay. Pt's insurance discontinued payment for rehab on 4/22 and per April, she was told she needed to bring pt home. April brought pt to her own home.  Pt then took herself out in her car and then was wandering in a church parking lot. APS and the [employer] were called. Pt was brought to ED.  Pt is medically stable for dc today if she will take oral Potassium per MD. If she will need IV, will have to dc tomorrow.  Discussed pt with Debbie at [facility] and they will take pt back with LTC medicaid pending. April went to DSS and started the LTC Medicaid application today and she went to [facility] to complete paperwork.  Will arrange EMS transport if pt able to take oral K+.  TOC will follow. Expected Discharge Plan: Long Term Nursing Home Barriers to Discharge: Continued Medical Work up   Patient Goals and CMS Choice Patient states their goals for this hospitalization and ongoing recovery are:: LTC CMS Medicare.gov Compare Post Acute Care list provided to:: Legal Guardian Choice offered to / list presented to : [facility] POA / Guardian      Expected Discharge Plan and Services In-house Referral: Clinical Social Work   Post Acute Care Choice: Nursing Home                                        Prior Living Arrangements/Services   Lives with:: Self Patient language and need for interpreter reviewed:: Yes        Need for Family Participation in Patient Care: Yes  (Comment) Care giver support system in place?: No (comment)   Criminal Activity/Legal Involvement Pertinent to Current Situation/Hospitalization: No - Comment as needed  Activities of Daily Living Home Assistive Devices/Equipment: Walker (specify type) ADL Screening (condition at time of admission) Patient's cognitive ability adequate to safely complete daily activities?: No Is the patient deaf or have difficulty hearing?: Yes Does the patient have difficulty seeing, even when wearing glasses/contacts?: Yes Does the patient have difficulty concentrating, remembering, or making decisions?: Yes Patient able to express need for assistance with ADLs?: Yes Does the patient have difficulty dressing or bathing?: Yes Independently performs ADLs?: No Communication: Independent Dressing (OT): Needs assistance Is this a change from baseline?: Pre-admission baseline Grooming: Needs assistance Is this a change from baseline?: Pre-admission baseline Feeding: Independent Bathing: Needs assistance Is this a change from baseline?: Pre-admission baseline Toileting: Needs assistance Is this a change from baseline?: Pre-admission baseline In/Out Bed: Needs assistance Is this a change from baseline?: Pre-admission baseline Walks in Home: Needs assistance Is this a change from baseline?: Pre-admission baseline Does the patient have difficulty walking or climbing stairs?: Yes Weakness of Legs: Both Weakness of Arms/Hands: None  Permission Sought/Granted Permission sought to share information with : Facility Industrial/product designer granted to share information  with : Yes, Verbal Permission Granted     Permission granted to share info w AGENCY: [facility]        Emotional Assessment Appearance:: Appears stated age     Orientation: : Oriented to Self, Oriented to Place Alcohol / Substance Use: Not Applicable Psych Involvement: No (comment)  Admission diagnosis:  Hypokalemia  [E87.6] Patient Active Problem List   Diagnosis Date Noted   Dehydration 11/25/2022   Failure to thrive in adult 11/25/2022   Acute metabolic encephalopathy 11/06/2022   Frequent falls 11/06/2022   Right sided weakness 11/05/2022   Paranoid delusion 10/28/2022   Current moderate episode of major depressive disorder 10/28/2022   Community acquired pneumonia 10/27/2022   Hardening of the aorta (main artery of the heart) 10/27/2022   Pruritic rash 10/27/2022   Generalized abdominal pain 09/29/2022   COPD (chronic obstructive pulmonary disease) 06/29/2022   Chronic respiratory failure with hypoxia 06/29/2022   Chronic systolic heart failure 06/29/2022   IPMN (intraductal papillary mucinous neoplasm) 06/09/2022   Constipation 06/09/2022   Atherosclerosis of coronary artery without angina pectoris 04/23/2022   Upper abdominal pain 12/03/2021   Hearing difficulty 06/19/2021   Neck pain 05/16/2021   CAD (coronary artery disease) 05/02/2021   Nausea 04/17/2021   Productive cough 04/17/2021   Chest pain 03/30/2021   COVID-19 virus infection 03/30/2021   Non-STEMI (non-ST elevated myocardial infarction)    Mass of pancreas 03/20/2021   Hypertensive urgency 03/19/2021   Elevated troponin 03/19/2021   Edema of lower extremity 01/14/2021   Impaired fasting glucose 01/07/2021   Generalized anxiety disorder 01/07/2021   Essential tremor 01/07/2021   Proteinuria 01/07/2021   Essential hypertension 01/02/2021   Iron deficiency anemia 01/02/2021   Prediabetes 01/02/2021   Difficulty swallowing 07/03/2020   Nonspecific chest pain 09/15/2016   Complete rotator cuff tear of left shoulder    Anemia 10/31/2014   H/O heart artery stent 10/30/2014   Chest pain with moderate risk for cardiac etiology 08/23/2014   Diastolic dysfunction with chronic heart failure 08/23/2014   Chronic diastolic heart failure 08/23/2014   Abdominal pain 08/23/2014   Acquired obstruction of both nasolacrimal ducts  06/15/2014   Lower abdominal pain 01/27/2012   Hypokalemia 06/03/2011   PERICARDIAL EFFUSION 07/10/2009   Disease of pericardium 07/10/2009   Essential hypertension, benign 04/18/2009   Triple vessel disease of the heart 04/18/2009   Benign essential hypertension 04/18/2009   PPM-Medtronic 04/04/2009   Sinus node dysfunction 01/26/2009   HLD (hyperlipidemia) 12/19/2008   Anxiety 12/19/2008   Persistent atrial fibrillation 12/19/2008   GERD (gastroesophageal reflux disease) 12/19/2008   Abnormal involuntary movements 12/19/2008   Atrial fibrillation, chronic 12/19/2008   Mixed hyperlipidemia 12/19/2008   PCP:  [name], MD Pharmacy:   [facility] [place], [place] - [id number] [place] [address] Phone: [phone] Fax: [phone]     Social Determinants of Health (SDOH) Social History: SDOH Screenings   Food Insecurity: No Food Insecurity (11/25/2022)  Housing: Low Risk  (11/25/2022)  Transportation Needs: No Transportation Needs (11/25/2022)  Utilities: Not At Risk (11/25/2022)  Recent Concern: Utilities - At Risk (11/06/2022)  Alcohol Screen: Low Risk  (11/05/2022)  Depression (PHQ2-9): Low Risk  (11/05/2022)  Financial Resource Strain: Low Risk  (11/05/2022)  Physical Activity: Inactive (11/05/2022)  Social Connections: Moderately Integrated (11/05/2022)  Stress: No Stress Concern Present (11/05/2022)  Tobacco Use: Medium Risk (11/25/2022)   SDOH Interventions:     Readmission Risk Interventions  07/02/2022   12:34 PM  Readmission Risk Prevention Plan  Transportation Screening Complete  PCP or Specialist Appt within 5-7 Days Complete  Home Care Screening Complete  Medication Review (RN CM) Complete

## 2022-11-25 NOTE — H&P (Signed)
History and Physical    Patient: [name] ZOX:[id number] DOB: July 16, 1936 DOA: 11/24/2022 DOS: the patient was seen and examined on 11/25/2022 PCP: [name], MD  Patient coming from: Home  Chief Complaint:  Chief Complaint  Patient presents with   SNF Placement   HPI: [name] is a 87 y.o. female with medical history significant of  chronic respiratory failure on supplemental oxygen at 2 LPM at night, COPD, atrial fibrillation, CHF, suspected pancreatic cancer, tachycardia-bradycardia syndrome s/p pacemaker, CAD who presented to the emergency department due to generalized weakness and confusion.  Patient was unable to provide history possibly due to underlying confusion, history was obtained from ED physician and ED medical record.  Per report, patient was seen at a church confused, it appeared that she drove to the church, she was seen by a bystander who called [facility] that took her to the ER.  [facility] was contacted prior to patient being taken to the ED.  Granddaughter was contacted by [employer] and she states that she was incapable of being able to take care of patient anymore. Patient was recently admitted from 4/3 to 4/10 due to acute metabolic encephalopathy and hypokalemia  ED Course:  In the emergency department, BP was 146/93 and other vital signs are within normal range when she arrived to the ED.  Workup in the ED showed normal CBC, BMP showed sodium of 132, potassium 2.7, chloride 99, bicarb 21, blood glucose 131, BUN 18, creatinine 0.86.  Urinalysis was positive for glycosuria and trace leukocytes, urine drug screen was normal, lipase 32. Chest x-ray showed cardiac enlargement with mild interstitial changes, possibly fibrosis or edema no change since prior study CT abdomen and pelvis with contrast showed no acute process demonstrated in the abdominal pelvis.  No evidence of bowel obstruction or inflammation.  Calcified granulomas in the liver and spleen.  Diffuse pancreatic ductal  dilatation, unchanged since prior study, likely chronic pancreatitis.  No acute changes CT head without contrast showed no acute intracranial abnormalities.  Chronic atrophy and small vessel ischemic changes. Potassium was replenished, IV NS 500 ml was given.  Hospitalist was asked to admit patient for further evaluation and management.   Review of Systems: Review of systems as noted in the HPI. All other systems reviewed and are negative.   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   CATARACT EXTRACTION     CHOLECYSTECTOMY     COLONOSCOPY  2011   COLONOSCOPY N/A 10/19/2014  Procedure: COLONOSCOPY;  Surgeon: Malissa Hippo, MD;  Location: [facility];  Service: Endoscopy;  Laterality: N/A;  1030   CORONARY STENT INTERVENTION N/A 05/28/2021   Procedure: CORONARY STENT INTERVENTION;  Surgeon: Marykay Lex, MD;  Location: [facility] INVASIVE CV LAB;  Service: Cardiovascular;  Laterality: N/A;   CORONARY ULTRASOUND/IVUS N/A 05/28/2021   Procedure: Intravascular Ultrasound/IVUS;  Surgeon: Marykay Lex, MD;  Location: [facility] INVASIVE CV LAB;   Service: Cardiovascular;  Laterality: N/A;   ESOPHAGEAL DILATION N/A 05/02/2020   Procedure: ESOPHAGEAL DILATION;  Surgeon: Malissa Hippo, MD;  [facility]: [facility];  Service: Gastroenterology;  Laterality: N/A;   ESOPHAGOGASTRODUODENOSCOPY N/A 10/26/2014   Procedure: ESOPHAGOGASTRODUODENOSCOPY (EGD);  Surgeon: Malissa Hippo, MD;  Location: [facility];  Service: Endoscopy;  Laterality: N/A;  230 - Dr. has lunch and learn   ESOPHAGOGASTRODUODENOSCOPY N/A 02/24/2022   Procedure: ESOPHAGOGASTRODUODENOSCOPY (EGD);  Surgeon: Lemar Lofty., MD;  Location: [facility];  Service: Gastroenterology;  Laterality: N/A;   ESOPHAGOGASTRODUODENOSCOPY (EGD) WITH PROPOFOL N/A 05/02/2020   Procedure: ESOPHAGOGASTRODUODENOSCOPY (EGD) WITH PROPOFOL;  Surgeon: Malissa Hippo, MD;  [facility]: [facility];  Service: Gastroenterology;  Laterality: N/A;  250   Esophagogastroduodenoscopy with esophageal dilation  2004, 2006, 2007   EUS N/A 02/24/2022   Procedure: UPPER ENDOSCOPIC ULTRASOUND (EUS) LINEAR;  Surgeon: Lemar Lofty., MD;  Location: [facility];  Service: Gastroenterology;  Laterality: N/A;   FINE NEEDLE ASPIRATION  02/24/2022   Procedure: FINE NEEDLE ASPIRATION;  Surgeon: Meridee Score Netty Starring., MD;  Location: [facility];  Service: Gastroenterology;;  red path sent   GIVENS CAPSULE STUDY N/A 10/31/2014   Procedure: GIVENS CAPSULE STUDY;  Surgeon: Malissa Hippo, MD;  Location: [facility];  Service: Endoscopy;  Laterality: N/A;  730 -- pacemaker--needs monitoring--outpatient bed   INSERT / REPLACE / REMOVE PACEMAKER  2010   LEFT HEART CATH AND CORONARY ANGIOGRAPHY N/A 03/21/2021   Procedure: LEFT HEART CATH AND CORONARY ANGIOGRAPHY;  Surgeon: Lyn Records, MD;  Location: [facility] INVASIVE CV LAB;  Service: Cardiovascular;  Laterality: N/A;   LEFT HEART CATHETERIZATION WITH CORONARY ANGIOGRAM N/A 11/17/2011   Procedure: LEFT HEART CATHETERIZATION WITH CORONARY ANGIOGRAM;  Surgeon:  Tonny Bollman, MD;  Location: [facility] CATH LAB;  Service: Cardiovascular;  Laterality: N/A;   LEFT HEART CATHETERIZATION WITH CORONARY ANGIOGRAM N/A 09/26/2014   Procedure: LEFT HEART CATHETERIZATION WITH CORONARY ANGIOGRAM;  Surgeon: Marykay Lex, MD;  Location: [facility] CATH LAB;  Service: Cardiovascular;  Laterality: N/A;   PPM GENERATOR CHANGEOUT N/A 07/16/2022   Procedure: PPM GENERATOR CHANGEOUT;  Surgeon: Marinus Maw, MD;  Location: [facility] INVASIVE CV LAB;  Service: Cardiovascular;  Laterality: N/A;   Right rotator cuff repair     SAVORY DILATION N/A 02/24/2022   Procedure: SAVORY DILATION;  Surgeon: Meridee Score Netty Starring., MD;  Location: [facility];  Service: Gastroenterology;  Laterality: N/A;   SHOULDER ACROMIOPLASTY Right 05/30/2015   Procedure: RIGHT SHOULDER ACROMIOPLASTY;  Surgeon: Vickki Hearing, MD;  Location: [facility];  Service: Orthopedics;  Laterality: Right;   SHOULDER OPEN ROTATOR CUFF REPAIR Right 05/30/2015   Procedure: OPEN ROTATOR CUFF REPAIR RIGHT SHOULDER;  Surgeon: Vickki Hearing, MD;  Location: [facility];  Service: Orthopedics;  Laterality: Right;   SHOULDER OPEN ROTATOR CUFF REPAIR Left 10/22/2016   Procedure: ROTATOR CUFF REPAIR SHOULDER OPEN;  Surgeon: Vickki Hearing, MD;  Location: [facility];  Service: Orthopedics;  Laterality: Left;   Subxiphoid pericardial window  11/10   VAGINAL  HYSTERECTOMY      Social History:  reports that she quit smoking about 33 years ago. Her smoking use included cigarettes. She has a 20.00 pack-year smoking history. She has been exposed to tobacco smoke. She has never used smokeless tobacco. She reports that she does not drink alcohol and does not use drugs.   Allergies  Allergen Reactions   Amitriptyline Hcl Other (See Comments)    Caused "jaws to twist and lock"   Plavix [Clopidogrel Bisulfate] Hives   Sulfonamide Derivatives Other (See Comments)    UNKNOWN REACTION    Family History  Problem Relation Age of Onset   Cancer  Mother        Colon    Coronary artery disease Sister    Coronary artery disease Brother    Arthritis Other    Lung disease Other    Asthma Other     Prior to Admission medications   Medication Sig Start Date End Date Taking? Authorizing Provider  albuterol (PROVENTIL) (2.5 MG/3ML) 0.083% nebulizer solution Take 3 mLs (2.5 mg total) by nebulization every 4 (four) hours as needed for wheezing or shortness of breath. 07/02/22 07/02/23  Shon Hale, MD  ALPRAZolam Prudy Feeler) 1 MG tablet Take 1 tablet (1 mg total) by mouth 3 (three) times daily as needed for anxiety. 11/12/22   Johnson, Clanford L, MD  amiodarone (PACERONE) 200 MG tablet TAKE 1 TABLET ONCE DAILY ON MONDAY THROUGH SATURDAY, NONE ON SUNDAY. Patient taking differently: Take 200 mg by mouth See admin instructions. TAKE 1 TABLET ONCE DAILY ON MONDAY THROUGH SATURDAY, NONE ON SUNDAY. 08/07/22   Marinus Maw, MD  aspirin EC 81 MG tablet Take 1 tablet (81 mg total) by mouth daily with breakfast. Swallow whole. 07/02/22   Shon Hale, MD  Cyanocobalamin (VITAMIN B-12) 5000 MCG SUBL Take 5,000 mcg by mouth daily.    [provider]  folic acid (FOLVITE) 1 MG tablet Take 1 tablet (1 mg total) by mouth daily. 11/12/22   Catarina Hartshorn, MD  isosorbide mononitrate (IMDUR) 30 MG 24 hr tablet Take 1 tablet (30 mg total) by mouth daily. 07/02/22   Shon Hale, MD  JARDIANCE 25 MG TABS tablet Take 25 mg by mouth daily.    [provider]  metoprolol succinate (TOPROL-XL) 50 MG 24 hr tablet Take 1 tablet (50 mg total) by mouth 2 (two) times daily. Take with or immediately following a meal. 07/02/22   Emokpae, Courage, MD  Multiple Vitamin (MULTIVITAMIN WITH MINERALS) TABS tablet Take 1 tablet by mouth daily.     [provider]  nitroGLYCERIN (NITROSTAT) 0.4 MG SL tablet Place 1 tablet (0.4 mg total) under the tongue every 5 (five) minutes x 3 doses as needed for chest pain. 03/22/21   Albertine Grates, MD  ondansetron  (ZOFRAN) 4 MG tablet Take 4 mg by mouth every 6 (six) hours.    [provider]  OXYGEN Inhale 2 L into the lungs as needed (PRN as needed at night).    [provider]  PROAIR HFA 108 (90 Base) MCG/ACT inhaler Inhale 1 puff into the lungs every 4 (four) hours as needed for wheezing or shortness of breath.  11/10/18   [provider]  ranolazine (RANEXA) 500 MG 12 hr tablet Take 1 tablet (500 mg total) by mouth 2 (two) times daily. 07/02/22   Shon Hale, MD  rosuvastatin (CRESTOR) 40 MG tablet Take 1 tablet (40 mg total) by mouth daily. 07/02/22   Shon Hale, MD  senna-docusate (SENOKOT-S) 8.6-50 MG tablet Take 1 tablet by mouth at bedtime. 03/22/21   Albertine Grates, MD  ticagrelor (BRILINTA) 60 MG TABS tablet Take 60 mg by mouth 2 (two) times daily.    [provider]  triamcinolone cream (KENALOG) 0.1 % Apply 1 Application topically daily as needed (for rash/skin irritation). 08/25/22   [provider]  vitamin E 180 MG (400 UNITS) capsule Take 400 Units by mouth daily.    [provider]    Physical Exam: BP (!) 165/67   Pulse 71   Temp 97.8 F (36.6 C) (Oral)   Resp (!) 22   Ht  (1.651 m)   Wt 64.6 kg   SpO2 98%   BMI 23.70 kg/m   General: 87 y.o. year-old female elderly female, lying in bed and pleasantly confused, but in no acute distress.  HEENT: NCAT, EOMI Neck: Supple, trachea medial Cardiovascular: Regular rate and rhythm with no rubs or gallops.  No thyromegaly or JVD noted.  No lower extremity edema. 2/4 pulses in all 4 extremities. Respiratory: Clear to auscultation with no wheezes or rales. Good inspiratory effort. Abdomen: Soft, nontender nondistended with normal bowel sounds x4 quadrants. Muskuloskeletal: No cyanosis, clubbing or edema noted bilaterally Neuro: CN II-XII intact, strength 5/5 x 4, sensation, reflexes intact Skin: No ulcerative lesions noted or rashes Psychiatry: Mood is appropriate for  condition and setting          Labs on Admission:  Basic Metabolic Panel: Recent Labs  Lab 11/24/22 2124  NA 132*  K 2.7*  CL 99  CO2 21*  GLUCOSE 131*  BUN 18  CREATININE 0.86  CALCIUM 9.9   Liver Function Tests: Recent Labs  Lab 11/24/22 2124  AST 29  ALT 20  ALKPHOS 83  BILITOT 1.1  PROT 7.7  ALBUMIN 3.9   Recent Labs  Lab 11/24/22 2124  LIPASE 32   No results for input(s): "AMMONIA" in the last 168 hours. CBC: Recent Labs  Lab 11/24/22 2124  WBC 10.2  NEUTROABS 8.4*  HGB 13.5  HCT 40.6  MCV 91.6  PLT 223   Cardiac Enzymes: No results for input(s): "CKTOTAL", "CKMB", "CKMBINDEX", "TROPONINI" in the last 168 hours.  BNP (last 3 results) Recent Labs    06/29/22 1145 11/06/22 0407  BNP 941.0* 354.0*    ProBNP (last 3 results) No results for input(s): "PROBNP" in the last 8760 hours.  CBG: No results for input(s): "GLUCAP" in the last 168 hours.  Radiological Exams on Admission: DG Chest Port 1 View  Result Date: 11/24/2022 CLINICAL DATA:  Confusion. EXAM: PORTABLE CHEST 1 VIEW COMPARISON:  11/05/2022 FINDINGS: Cardiac pacemaker. Cardiac enlargement with mild central vascular congestion. Interstitial changes may represent fibrosis or mild edema. Appearances are similar to prior study. No pleural effusions. No pneumothorax. No focal consolidation. Mediastinal contours appear intact. Calcified and tortuous aorta. IMPRESSION: Cardiac enlargement with mild interstitial changes, possibly fibrosis or edema. No change since prior study. Electronically Signed   By: Burman Nieves M.D.   On: 11/24/2022 23:00   CT ABDOMEN PELVIS W CONTRAST  Result Date: 11/24/2022 CLINICAL DATA:  Acute nonlocalized abdominal pain. Patient was [place] church confused. EXAM: CT ABDOMEN AND PELVIS WITH CONTRAST TECHNIQUE: Multidetector CT imaging of the abdomen and pelvis was performed using the standard protocol following bolus administration of intravenous contrast.  RADIATION DOSE REDUCTION: This exam was performed according to the departmental dose-optimization program which includes automated exposure control, adjustment of the mA and/or kV  according to patient size and/or use of iterative reconstruction technique. CONTRAST:  OMNIPAQUE IOHEXOL 300 MG/ML  SOLN COMPARISON:  11/01/2022 FINDINGS: Lower chest: Lung bases are clear. Hepatobiliary: Surgical absence of the gallbladder. No bile duct dilatation. Multiple tiny calcifications throughout the liver, likely calcified granulomas. No focal lesions. Pancreas: Diffuse pancreatic ductal dilatation likely resulting from chronic pancreatitis. Similar appearance to previous study. No acute inflammatory changes or mass lesion identified. Spleen: Calcifications in the spleen, likely granulomas. Adrenals/Urinary Tract: Adrenal glands are unremarkable. Kidneys are normal, without renal calculi, focal lesion, or hydronephrosis. Bladder is unremarkable. Stomach/Bowel: Stomach, small bowel, and colon are not abnormally distended. Scattered stool in the colon. No wall thickening or inflammatory changes. Appendix is not identified. Vascular/Lymphatic: Aortic atherosclerosis. No enlarged abdominal or pelvic lymph nodes. Reproductive: No pelvic masses. Other: No abdominal wall hernia or abnormality. No abdominopelvic ascites. Musculoskeletal: Degenerative changes in the spine. IMPRESSION: 1. No acute process demonstrated in the abdomen or pelvis. No evidence of bowel obstruction or inflammation. 2. Calcified granulomas in the liver and spleen. 3. Diffuse pancreatic ductal dilatation, unchanged since prior study, likely chronic pancreatitis. No acute changes. 4. Aortic atherosclerosis. Electronically Signed   By: Burman Nieves M.D.   On: 11/24/2022 22:58   CT Head Wo Contrast  Result Date: 11/24/2022 CLINICAL DATA:  Acute neurological deficit with stroke suspected. Patient was found at church confused. EXAM: CT HEAD WITHOUT  CONTRAST TECHNIQUE: Contiguous axial images were obtained from the base of the skull through the vertex without intravenous contrast. RADIATION DOSE REDUCTION: This exam was performed according to the departmental dose-optimization program which includes automated exposure control, adjustment of the mA and/or kV according to patient size and/or use of iterative reconstruction technique. COMPARISON:  11/06/2022 FINDINGS: Brain: Diffuse cerebral atrophy. Ventricular dilatation consistent with central atrophy. Low-attenuation changes in the deep white matter consistent with small vessel ischemia. No abnormal extra-axial fluid collections. No mass effect or midline shift. Gray-white matter junctions are distinct. Basal cisterns are not effaced. No acute intracranial hemorrhage. Vascular: No hyperdense vessel or unexpected calcification. Skull: Normal. Negative for fracture or focal lesion. Sinuses/Orbits: No acute finding. Other: None. IMPRESSION: No acute intracranial abnormalities. Chronic atrophy and small vessel ischemic changes. Electronically Signed   By: Burman Nieves M.D.   On: 11/24/2022 22:54    EKG: I independently viewed the EKG done and my findings are as followed: Atrial fibrillation with rate controlled  Assessment/Plan Present on Admission:  Hypokalemia  Acute metabolic encephalopathy  Persistent atrial fibrillation  CAD (coronary artery disease)  Chronic respiratory failure with hypoxia  Sinus node dysfunction  Essential hypertension  COPD (chronic obstructive pulmonary disease)  Chronic diastolic heart failure  Anxiety  Principal Problem:   Acute metabolic encephalopathy Active Problems:   COPD (chronic obstructive pulmonary disease)   Chronic respiratory failure with hypoxia   Anxiety   Persistent atrial fibrillation   Sinus node dysfunction   Hypokalemia   Chronic diastolic heart failure   Essential hypertension   CAD (coronary artery disease)   Frequent falls    Dehydration   Failure to thrive in adult  Acute metabolic encephalopathy Patient had similar presentation during last admission This is possibly patient's baseline of cognition Patient lives alone, she does not appear to be able to take care of herself and granddaughter is unable to take care of her anymore. TOC will be consulted for SNF placement  Hypokalemia K+ is 2.7 K+ will be replenished Please monitor for AM K+ for further replenishmemnt  Dehydration Continue IV hydration  Frequent falls/failure to thrive Continue PT eval and treat   Persistent atrial fibrillation EKG personally reviewed showed atrial fibrillation with rate controlled Continue amiodarone Patient has declined anticoagulation in the past per medical record   Chronic respiratory failure with hypoxia Continue home oxygen   Coronary artery disease Stable. Continue Ranexa, Brilinta, imdur, and metoprolol succinate   Sinus node dysfunction Patient is status post PPM   Essential hypertension Continue metoprolol succinate   Chronic diastolic CHF Clinically euvolemic Echo from 06/30/2022 showed LVEF of 55 to 60%, no RWMA   Anxiety Continue Xanax   COPD Stable, continue home meds    DVT prophylaxis: Lovenox   Advance Care Planning: DNR  Consults: None  Family Communication: None at bedside  Severity of Illness: The appropriate patient status for this patient is INPATIENT. Inpatient status is judged to be reasonable and necessary in order to provide the required intensity of service to ensure the patient's safety. The patient's presenting symptoms, physical exam findings, and initial radiographic and laboratory data in the context of their chronic comorbidities is felt to place them at high risk for further clinical deterioration. Furthermore, it is not anticipated that the patient will be medically stable for discharge from the hospital within 2 midnights of admission.   * I certify that at the  point of admission it is my clinical judgment that the patient will require inpatient hospital care spanning beyond 2 midnights from the point of admission due to high intensity of service, high risk for further deterioration and high frequency of surveillance required.*  Author: Frankey Shown, DO 11/25/2022 9:06 AM  For on call review [email or web address].

## 2022-11-26 DIAGNOSIS — I4891 Unspecified atrial fibrillation: Secondary | ICD-10-CM | POA: Diagnosis not present

## 2022-11-26 DIAGNOSIS — I10 Essential (primary) hypertension: Secondary | ICD-10-CM | POA: Diagnosis not present

## 2022-11-26 DIAGNOSIS — I2510 Atherosclerotic heart disease of native coronary artery without angina pectoris: Secondary | ICD-10-CM | POA: Diagnosis not present

## 2022-11-26 DIAGNOSIS — R627 Adult failure to thrive: Secondary | ICD-10-CM | POA: Diagnosis not present

## 2022-11-26 DIAGNOSIS — E785 Hyperlipidemia, unspecified: Secondary | ICD-10-CM | POA: Diagnosis not present

## 2022-11-26 DIAGNOSIS — G9341 Metabolic encephalopathy: Secondary | ICD-10-CM | POA: Diagnosis not present

## 2022-11-26 DIAGNOSIS — E876 Hypokalemia: Secondary | ICD-10-CM | POA: Diagnosis not present

## 2022-11-28 DIAGNOSIS — R0602 Shortness of breath: Secondary | ICD-10-CM | POA: Diagnosis not present

## 2022-11-28 DIAGNOSIS — I2510 Atherosclerotic heart disease of native coronary artery without angina pectoris: Secondary | ICD-10-CM | POA: Diagnosis not present

## 2022-12-01 DIAGNOSIS — E876 Hypokalemia: Secondary | ICD-10-CM | POA: Diagnosis not present

## 2022-12-01 DIAGNOSIS — I4891 Unspecified atrial fibrillation: Secondary | ICD-10-CM | POA: Diagnosis not present

## 2022-12-01 DIAGNOSIS — I2510 Atherosclerotic heart disease of native coronary artery without angina pectoris: Secondary | ICD-10-CM | POA: Diagnosis not present

## 2022-12-01 DIAGNOSIS — G9341 Metabolic encephalopathy: Secondary | ICD-10-CM | POA: Diagnosis not present

## 2022-12-01 DIAGNOSIS — I10 Essential (primary) hypertension: Secondary | ICD-10-CM | POA: Diagnosis not present

## 2022-12-01 DIAGNOSIS — R627 Adult failure to thrive: Secondary | ICD-10-CM | POA: Diagnosis not present

## 2022-12-01 DIAGNOSIS — E785 Hyperlipidemia, unspecified: Secondary | ICD-10-CM | POA: Diagnosis not present

## 2022-12-01 NOTE — Progress Notes (Signed)
Remote pacemaker transmission.   

## 2022-12-02 ENCOUNTER — Encounter: Payer: Self-pay | Admitting: *Deleted

## 2022-12-02 ENCOUNTER — Ambulatory Visit: Payer: Self-pay | Admitting: *Deleted
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
## 2022-12-03 DIAGNOSIS — I2510 Atherosclerotic heart disease of native coronary artery without angina pectoris: Secondary | ICD-10-CM | POA: Diagnosis not present

## 2022-12-05 DIAGNOSIS — I2510 Atherosclerotic heart disease of native coronary artery without angina pectoris: Secondary | ICD-10-CM | POA: Diagnosis not present

## 2022-12-05 DIAGNOSIS — R451 Restlessness and agitation: Secondary | ICD-10-CM | POA: Diagnosis not present

## 2022-12-05 DIAGNOSIS — E876 Hypokalemia: Secondary | ICD-10-CM | POA: Diagnosis not present

## 2022-12-08 ENCOUNTER — Ambulatory Visit (INDEPENDENT_AMBULATORY_CARE_PROVIDER_SITE_OTHER): Payer: 59 | Admitting: Gastroenterology

## 2022-12-08 ENCOUNTER — Encounter (INDEPENDENT_AMBULATORY_CARE_PROVIDER_SITE_OTHER): Payer: Self-pay | Admitting: Gastroenterology

## 2022-12-08 VITALS — BP 134/81 | HR 66 | Temp 98.2°F | Ht 62.0 in | Wt 126.5 lb

## 2022-12-08 DIAGNOSIS — R627 Adult failure to thrive: Secondary | ICD-10-CM

## 2022-12-08 DIAGNOSIS — R110 Nausea: Secondary | ICD-10-CM | POA: Diagnosis not present

## 2022-12-08 DIAGNOSIS — E876 Hypokalemia: Secondary | ICD-10-CM

## 2022-12-08 DIAGNOSIS — G8929 Other chronic pain: Secondary | ICD-10-CM | POA: Diagnosis not present

## 2022-12-08 DIAGNOSIS — R1011 Right upper quadrant pain: Secondary | ICD-10-CM | POA: Diagnosis not present

## 2022-12-08 DIAGNOSIS — D490 Neoplasm of unspecified behavior of digestive system: Secondary | ICD-10-CM | POA: Diagnosis not present

## 2022-12-08 MED ORDER — DICYCLOMINE HCL 10 MG PO CAPS
10.0000 mg | ORAL_CAPSULE | Freq: Two times a day (BID) | ORAL | 1 refills | Status: AC | PRN
Start: 2022-12-08 — End: ?

## 2022-12-08 NOTE — Patient Instructions (Addendum)
Perform blood workup Continue Zofran as needed for nausea Referral for palliative care evaluation Continue Zofran 4 mg q8h as needed for nausea Start Bentyl 1 tablet q12h as needed for abdominal pain Will hold off on further imaging for cyst surveillance given dementia, frailty and comorbidities Take protein shakes 2-3 times a day

## 2022-12-08 NOTE — Progress Notes (Signed)
[name], M.D. Gastroenterology & Hepatology [name] [address] [place], [place] [address]  Primary Care Physician: Katrina Stabile, MD [address] [name] [address]  I will communicate my assessment and recommendations to the referring MD via EMR.  Problems: Mixed branch duct/main duct IPMN Chronic recurrent abdominal pain  History of Present Illness: [name] is a 87 y.o. female with past medical history of Alzheimer's dementia, COPD, coronary artery disease status post stent placement, [name] syndrome, hypertension, hyperlipidemia, mixed type IPMN, atrial fibrillation, anxiety, who presents for follow up of IPMN and abdominal pain.   The patient was last seen on 09/25/2022. At that time, the patient was ordered to have repeat CT of the pancreas in March as described below.  Patient comes to the office with her daughter which helps providing information as the patient is very forgetful and does not provide reliable history. States her mom is living in a nursing home.  Based on her most recent clinical notes, the patient was not able to take care of herself given her dementia.    The patient is having frequent intermittent episodes of chest pain, which they believe are related to acid reflux episodes. She has presented some episodes of pain in the R side of her abdomen - states this has been present for long time (unclear since when). She has presented intermittent nausea chronically -  has a prescription for Zofran but it seems she is not asking for this medication at the nursing home as she is not asking for this medicine.  The patient denies having any vomiting, fever, chills, hematochezia, melena, hematemesis, abdominal distention, diarrhea, jaundice, pruritus.  Patient was [facility] to [name] on 11[facility]/2022 after presenting confusion.  Was found to have severe hypokalemia which was repleted adequately.  No  repeat blood testing is available.  Patient has history of an enlarging pancreatic cystic lesion with associated abdominal pain.  Due to this she was referred for endoscopic ultrasound.  Underwent endoscopic ultrasound with [name] on 02/24/2022 with findings described below: A non-obstructing Schatzki ring was found at the gastroesophageal junction. Biopsies were taken with a cold forceps for disruption purposes. No other gross lesions were noted in the entire esophagus. After completion of the EUS, a guidewire was placed and the scope was withdrawn. Dilation was performed with a Savary dilator with mild resistance at 18 mm. The dilation site was examined following endoscope reinsertion and showed mild mucosal disruption/wrent just below the UES, mild improvement in luminal narrowing and no perforation. A 2 cm hiatal hernia was present. Multiple dispersed small erosions with no bleeding and no stigmata of recent bleeding were found in the entire examined stomach. Biopsies were taken with a cold forceps for histology and Helicobacter pylori testing. No other gross lesions were noted in the duodenal bulb, in the first portion of the duodenum and in the second portion of the duodenum. A fishmouth deformity was found at the major papilla.  EUS part: Anechoic lesions suggestive of three cysts were identified in the pancreatic head (1) and genu of the pancreas (2). The first cyst measured 19 mm by 10 mm in the HOP. The second cyst measured 7 mm by 6 mm in the NOP. The third cyst measured 9 mm by 7 mm in the NOP. There was no associated masses. Diagnostic needle aspiration for fluid was performed. Color Doppler imaging was utilized prior to needle puncture to confirm a lack of significant  vascular structures within the needle path. One pass was made with the Expect 22 gauge needle using a transduodenal approach to the HOP cyst. A stylet was used. The amount of fluid collected was ~1 mL. The fluid was  clear and white. Sample was sent for amylase concentration, cytology and CEA. Pancreatic parenchymal abnormalities were noted in the entire pancreas. These consisted of lobularity without honeycombing, cysts and hyperechoic strands. The pancreatic duct had a prominently branched endosonographic appearance and had a tortuous/ectatic appearance as well as some prominence in the pancreatic head (2.7 -> 4.0 mm), genu of the pancreas (3.0 mm), body of the pancreas (3.3 mm) and tail of the pancreas (2.4 mm).  There was no sign of significant endosonographic abnormality in the common bile duct (4.0 mm)and in the common hepatic duct (7.5 mm). Endosonographic imaging of the ampulla showed no intramural (subepithelial) lesion. No malignant-appearing lymph nodes were visualized in the celiac region (level 20), peripancreatic region and porta hepatis region. Endosonographic imaging in the visualized portion of the liver showed no mass.   NOTE: could not get the Linear EUS to pass into the short position from D2 and thus could not evaluate the Uncinate Process of the Pancreas.   CEA 22 , amylase 21,144, cytology showed acellular specimen   Her case was discussed at multidisciplinary tumor board.  It was considered that she was presenting changes concerning for a mixed branch duct/main duct IPMN.  It was considered that given her medical comorbidities she would not be the best surgical candidate but she will continue monitoring.  It was decided that she should have a repeat CT pancreas protocol in 6 months.  Depending on the findings on imaging, may need to discuss again the benefits versus risk of undergoing surgical resection.   Notably, the patient underwent a CT of the abdomen with and without IV contrast on 05/01/2022 which showed decrease size of the cystic lesions of the pancreatic head and uncinate process measuring up to 15 mm.  She was advised to have a repeat CT pancreas protocol in 6 months.   The  patient had a repeat CT pancreas protocol on 10/07/2022 which showed stable size of cystic lesions measuring up to 17 mm in the central pancreatic head proximity to the portal confluence.  Compared to the previous study, the largest lesion was 2 mm larger.  Last CA 19-9 was 14.  Last EGD: as above  Past Medical History: 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    Essential hypertension    GERD (gastroesophageal reflux disease)    H/O hiatal hernia    Headache(784.0)    HOH (hard of hearing)    Hyperlipidemia    Neuromuscular disorder (HCC)    Tremors   Pericardial effusion    Hemorrhagic    Presence of permanent cardiac pacemaker    Tachycardia-bradycardia syndrome (HCC)    s/p Medtronic Adapta L dual chamber device  5/10    Past Surgical History: Past Surgical History:  Procedure Laterality Date   APPENDECTOMY     BIOPSY  02/24/2022   Procedure: BIOPSY;  Surgeon: Lemar Lofty., MD;  Location: [facility];  Service: Gastroenterology;;  CARDIAC CATHETERIZATION  2010   stents x2.   CATARACT EXTRACTION     CHOLECYSTECTOMY     COLONOSCOPY  2011   COLONOSCOPY N/A 10/19/2014   Procedure: COLONOSCOPY;  Surgeon: Malissa Hippo, MD;  Location: [facility];  Service: Endoscopy;  Laterality: N/A;  1030   CORONARY STENT INTERVENTION N/A 05/28/2021   Procedure: CORONARY STENT INTERVENTION;  Surgeon: Marykay Lex, MD;  Location: [name];  Service: Cardiovascular;  Laterality: N/A;   CORONARY ULTRASOUND/IVUS N/A 05/28/2021   Procedure: Intravascular Ultrasound/IVUS;  Surgeon: Marykay Lex, MD;  Location: [facility] INVASIVE CV LAB;  Service: Cardiovascular;  Laterality: N/A;   ESOPHAGEAL DILATION N/A 05/02/2020    Procedure: ESOPHAGEAL DILATION;  Surgeon: Malissa Hippo, MD;  [facility];  Service: Gastroenterology;  Laterality: N/A;   ESOPHAGOGASTRODUODENOSCOPY N/A 10/26/2014   Procedure: ESOPHAGOGASTRODUODENOSCOPY (EGD);  Surgeon: Malissa Hippo, MD;  [facility]: [facility];  Service: Endoscopy;  Laterality: N/A;  230 - Dr. has lunch and learn   ESOPHAGOGASTRODUODENOSCOPY N/A 02/24/2022   Procedure: ESOPHAGOGASTRODUODENOSCOPY (EGD);  Surgeon: Lemar Lofty., MD;  Location: [facility];  Service: Gastroenterology;  Laterality: N/A;   ESOPHAGOGASTRODUODENOSCOPY (EGD) WITH PROPOFOL N/A 05/02/2020   Procedure: ESOPHAGOGASTRODUODENOSCOPY (EGD) WITH PROPOFOL;  Surgeon: Malissa Hippo, MD;  [facility];  Service: Gastroenterology;  Laterality: N/A;  250   Esophagogastroduodenoscopy with esophageal dilation  2004, 2006, 2007   EUS N/A 02/24/2022   Procedure: UPPER ENDOSCOPIC ULTRASOUND (EUS) LINEAR;  Surgeon: Lemar Lofty., MD;  Location: [facility];  Service: Gastroenterology;  Laterality: N/A;   FINE NEEDLE ASPIRATION  02/24/2022   Procedure: FINE NEEDLE ASPIRATION;  Surgeon: Meridee Score Netty Starring., MD;  Location: [facility];  Service: Gastroenterology;;  red path sent   GIVENS CAPSULE STUDY N/A 10/31/2014   Procedure: GIVENS CAPSULE STUDY;  Surgeon: Malissa Hippo, MD;  [facility]: [facility];  Service: Endoscopy;  Laterality: N/A;  730 -- pacemaker--needs monitoring--outpatient bed   INSERT / REPLACE / REMOVE PACEMAKER  2010   LEFT HEART CATH AND CORONARY ANGIOGRAPHY N/A 03/21/2021   Procedure: LEFT HEART CATH AND CORONARY ANGIOGRAPHY;  Surgeon: Lyn Records, MD;  Location: [facility] INVASIVE CV LAB;  Service: Cardiovascular;  Laterality: N/A;   LEFT HEART CATHETERIZATION WITH CORONARY ANGIOGRAM N/A 11/17/2011   Procedure: LEFT HEART CATHETERIZATION WITH CORONARY ANGIOGRAM;  Surgeon: Tonny Bollman, MD;  Location: [facility] CATH LAB;  Service: Cardiovascular;   Laterality: N/A;   LEFT HEART CATHETERIZATION WITH CORONARY ANGIOGRAM N/A 09/26/2014   Procedure: LEFT HEART CATHETERIZATION WITH CORONARY ANGIOGRAM;  Surgeon: Marykay Lex, MD;  Location: [facility] CATH LAB;  Service: Cardiovascular;  Laterality: N/A;   PPM GENERATOR CHANGEOUT N/A 07/16/2022   Procedure: PPM GENERATOR CHANGEOUT;  Surgeon: Marinus Maw, MD;  Location: [name];  Service: Cardiovascular;  Laterality: N/A;   Right rotator cuff repair     SAVORY DILATION N/A 02/24/2022   Procedure: SAVORY DILATION;  Surgeon: Meridee Score Netty Starring., MD;  Location: [facility];  Service: Gastroenterology;  Laterality: N/A;   SHOULDER ACROMIOPLASTY Right 05/30/2015   Procedure: RIGHT SHOULDER ACROMIOPLASTY;  Surgeon: Vickki Hearing, MD;  Location: [facility];  Service: Orthopedics;  Laterality: Right;   SHOULDER OPEN ROTATOR CUFF REPAIR Right 05/30/2015   Procedure: OPEN ROTATOR CUFF REPAIR RIGHT SHOULDER;  Surgeon: Vickki Hearing, MD;  Location: [facility];  Service: Orthopedics;  Laterality: Right;   SHOULDER OPEN ROTATOR CUFF REPAIR Left 10/22/2016   Procedure:  ROTATOR CUFF REPAIR SHOULDER OPEN;  Surgeon: Vickki Hearing, MD;  Location: [facility];  Service: Orthopedics;  Laterality: Left;   Subxiphoid pericardial window  11/10   VAGINAL HYSTERECTOMY      Family History: Family History  Problem Relation Age of Onset   Cancer Mother        Colon    Coronary artery disease Sister    Coronary artery disease Brother    Arthritis Other    Lung disease Other    Asthma Other     Social History: Social History   Tobacco Use  Smoking Status Former   Packs/day: 2.00   Years: 10.00   Additional pack years: 0.00   Total pack years: 20.00   Types: Cigarettes   Quit date: 05/23/1989   Years since quitting: 33.5   Passive exposure: Past  Smokeless Tobacco Never  Tobacco Comments   Verified by Granddaughter, [name]   Social History   Substance and Sexual Activity  Alcohol  Use No   Alcohol/week: 0.0 standard drinks of alcohol   Social History   Substance and Sexual Activity  Drug Use No    Allergies: Allergies  Allergen Reactions   Amitriptyline Hcl Other (See Comments)    Caused "jaws to twist and lock"   Plavix [Clopidogrel Bisulfate] Hives   Sulfonamide Derivatives Other (See Comments)    UNKNOWN REACTION    Medications: Current Outpatient Medications  Medication Sig Dispense Refill   acetaminophen (TYLENOL) 325 MG tablet Take 2 tablets (650 mg total) by mouth every 6 (six) hours as needed for mild pain (or Fever >/= 101). 100 tablet 1   albuterol (PROVENTIL) (2.5 MG/3ML) 0.083% nebulizer solution Take 3 mLs (2.5 mg total) by nebulization every 4 (four) hours as needed for wheezing or shortness of breath. 75 mL 2   ALPRAZolam (XANAX) 1 MG tablet Take 1 tablet (1 mg total) by mouth 2 (two) times daily as needed for anxiety. 12 tablet 0   amiodarone (PACERONE) 200 MG tablet TAKE 1 TABLET ONCE DAILY ON MONDAY THROUGH SATURDAY, NONE ON SUNDAY. (Patient taking differently: Take 200 mg by mouth See admin instructions. TAKE 1 TABLET ONCE DAILY ON MONDAY THROUGH SATURDAY, NONE ON SUNDAY.) 72 tablet 0   aspirin EC 81 MG tablet Take 1 tablet (81 mg total) by mouth daily with breakfast. Swallow whole. 30 tablet 12   Cyanocobalamin (VITAMIN B-12) 5000 MCG SUBL Take 5,000 mcg by mouth daily.     ertugliflozin L-PyroglutamicAc (STEGLATRO) 15 MG TABS tablet Take 15 mg by mouth daily.     folic acid (FOLVITE) 1 MG tablet Take 1 tablet (1 mg total) by mouth daily.     isosorbide mononitrate (IMDUR) 30 MG 24 hr tablet Take 1 tablet (30 mg total) by mouth daily. 90 tablet 5   metoprolol succinate (TOPROL-XL) 50 MG 24 hr tablet Take 1 tablet (50 mg total) by mouth 2 (two) times daily. Take with or immediately following a meal. 60 tablet 4   Multiple Vitamin (MULTIVITAMIN WITH MINERALS) TABS tablet Take 1 tablet by mouth daily.      nitroGLYCERIN (NITROSTAT) 0.4 MG SL  tablet Place 1 tablet (0.4 mg total) under the tongue every 5 (five) minutes x 3 doses as needed for chest pain. 30 tablet 12   ondansetron (ZOFRAN) 4 MG tablet Take 4 mg by mouth every 6 (six) hours.     ondansetron (ZOFRAN) 4 MG tablet Take 1 tablet (4 mg total) by mouth daily as needed  for nausea or vomiting. 30 tablet 1   OXYGEN Inhale 2 L into the lungs as needed (PRN as needed at night).     Potassium Chloride ER 20 MEQ TBCR Take 1 tablet (20 mEq total) by mouth daily for 4 days. 1 tab daily by mouth 4 tablet 0   PROAIR HFA 108 (90 Base) MCG/ACT inhaler Inhale 1 puff into the lungs every 4 (four) hours as needed for wheezing or shortness of breath.      ranolazine (RANEXA) 500 MG 12 hr tablet Take 1 tablet (500 mg total) by mouth 2 (two) times daily. 60 tablet 8   rosuvastatin (CRESTOR) 40 MG tablet Take 1 tablet (40 mg total) by mouth daily. 90 tablet 3   senna-docusate (SENOKOT-S) 8.6-50 MG tablet Take 2 tablets by mouth at bedtime. 120 tablet 1   ticagrelor (BRILINTA) 60 MG TABS tablet Take 60 mg by mouth 2 (two) times daily.     triamcinolone cream (KENALOG) 0.1 % Apply 1 Application topically daily as needed (for rash/skin irritation).     vitamin E 180 MG (400 UNITS) capsule Take 400 Units by mouth daily.     JARDIANCE 25 MG TABS tablet Take 25 mg by mouth daily.     No current [name]-administered medications for this visit.    Review of Systems: GENERAL: negative for malaise, night sweats HEENT: No changes in hearing or vision, no nose bleeds or other nasal problems. NECK: Negative for lumps, goiter, pain and significant neck swelling RESPIRATORY: Negative for cough, wheezing CARDIOVASCULAR: Negative for chest pain, leg swelling, palpitations, orthopnea GI: SEE HPI MUSCULOSKELETAL: Negative for joint pain or swelling, back pain, and muscle pain. SKIN: Negative for lesions, rash PSYCH: Negative for sleep disturbance, mood disorder and recent psychosocial stressors. HEMATOLOGY  Negative for prolonged bleeding, bruising easily, and swollen nodes. ENDOCRINE: Negative for cold or heat intolerance, polyuria, polydipsia and goiter. NEURO: negative for tremor, gait imbalance, syncope and seizures. The remainder of the review of systems is noncontributory.   Physical Exam: BP 134/81 (BP Location: Left Arm, Patient Position: Sitting, Cuff Size: Normal)   Pulse 66   Temp 98.2 F (36.8 C) (Temporal)   Ht 5\' 2"  (1.575 m)   Wt 126 lb 8 oz (57.4 kg)   BMI 23.14 kg/m  GENERAL: The patient is AO x2, forgetful, in no acute distress. Looks frail. Sitting in wheelchair. HEENT: Head is normocephalic and atraumatic. EOMI are intact. Mouth is well hydrated and without lesions. Has temporal wasting. NECK: Supple. No masses LUNGS: Clear to auscultation. No presence of rhonchi/wheezing/rales. Adequate chest expansion HEART: RRR, normal s1 and s2. ABDOMEN: Soft, nontender, no guarding, no peritoneal signs, and nondistended. BS +. No masses. EXTREMITIES: Without any cyanosis, clubbing, rash, lesions or edema. NEUROLOGIC: AOx2, no focal motor deficit. SKIN: no jaundice, no rashes  Imaging/Labs: as above  I personally reviewed and interpreted the available labs, imaging and endoscopic files.  Impression and Plan: [name] is a 87 y.o. female with past medical history of Alzheimer's dementia, COPD, coronary artery disease status post stent placement, [name] syndrome, hypertension, hyperlipidemia, mixed type IPMN, atrial fibrillation, anxiety, who presents for follow up of IPMN and abdominal pain.  Patient has presented chronic issues with abdominal pain for multiple years.  She has had a complex pancreatic lesion that has been enlarging through the years but it has remained relatively stable recently.  Consideration for possible repeat imaging has been helpful with the family in the past after her case was  discussed in the multidisciplinary meeting at [facility].  Nevertheless, the  patient's comorbidities have worsened with time and her dementia has progressed as well, she is no longer able to take care of herself.  Due to this, I had a very thorough discussion with her daughter regarding the goals of care, potential treatment options and poor prognosis if she undergoes surgical approach in the future.  Daughter understood and states that she is not interested in pursuing any aggressive measures for her.  We discussed the possibility of palliative care involvement to discuss symptom control and establish goals of care in the long run.  For now, we will provide some symptom relief with Bentyl as needed, she can continue taking Zofran as needed for nausea as well.  Finally, she had some significant hypokalemia in her most recent blood workup, we will recheck this today.  - Perform blood workup - Continue Zofran as needed for nausea - Referral for palliative care evaluation - Continue Zofran 4 mg q8h as needed for nausea - Start Bentyl 1 tablet q12h as needed for abdominal pain - Will hold off on further imaging for cyst surveillance given dementia, frailty and comorbidities - Take protein shakes 2-3 times a day  All questions were answered.      [name], MD Gastroenterology and Hepatology [name]

## 2022-12-09 ENCOUNTER — Encounter: Payer: Self-pay | Admitting: *Deleted

## 2022-12-09 ENCOUNTER — Ambulatory Visit: Payer: Self-pay | Admitting: *Deleted

## 2022-12-09 DIAGNOSIS — D649 Anemia, unspecified: Secondary | ICD-10-CM | POA: Diagnosis not present

## 2022-12-09 DIAGNOSIS — G8929 Other chronic pain: Secondary | ICD-10-CM | POA: Diagnosis not present

## 2022-12-09 DIAGNOSIS — E569 Vitamin deficiency, unspecified: Secondary | ICD-10-CM | POA: Diagnosis not present

## 2022-12-09 DIAGNOSIS — Z760 Encounter for issue of repeat prescription: Secondary | ICD-10-CM | POA: Diagnosis not present

## 2022-12-09 DIAGNOSIS — R627 Adult failure to thrive: Secondary | ICD-10-CM | POA: Diagnosis not present

## 2022-12-09 DIAGNOSIS — Z79899 Other long term (current) drug therapy: Secondary | ICD-10-CM | POA: Diagnosis not present

## 2022-12-09 DIAGNOSIS — R079 Chest pain, unspecified: Secondary | ICD-10-CM | POA: Diagnosis not present

## 2022-12-09 NOTE — Patient Outreach (Signed)
Care Coordination   Follow Up Visit Note   12/09/2022  Name: [name] MRN: [id number] DOB: April 16, 1936  [name] is a 87 y.o. year old female who sees [name], Katrina Hazel, MD for primary care. I spoke with granddaughter and legal guardian, [name] by phone today.  What matters to the patients health and wellness today?  Assist with Arranging In-Home Care Services.   Goals Addressed             This Visit's Progress    COMPLETED: Assist with Arranging In-Home Care Services.   On track    Care Coordination Interventions:  Interventions Today    Flowsheet Row Most Recent Value  Chronic Disease   Chronic disease during today's visit Other  [Generalized Anxiety Disorder, Caregiver Stress/Burnout]  General Interventions   General Interventions Discussed/Reviewed General Interventions Discussed, Labs, Vaccines, Doctor Visits, Health Screening, General Interventions Reviewed, Annual Eye Exam, Durable Medical Equipment (DME), Community Resources, Level of Care, Communication with  [Communication with Primary Care Provider]  Vaccines COVID-19, Flu, Pneumonia, RSV, Shingles, Tetanus/Pertussis/Diphtheria  [Encouraged]  Doctor Visits Discussed/Reviewed Doctor Visits Discussed, Specialist, Doctor Visits Reviewed, Annual Wellness Visits, PCP  [Encouraged]  Health Screening Bone Density, Colonoscopy, Mammogram  [Encouraged]  Durable Medical Equipment (DME) Shower bench, BP Cuff, Walker  PCP/Specialist Visits Compliance with follow-up visit  [Encouraged]  Communication with PCP/Specialists, RN  Level of Care Adult Daycare, Personal Care Services, Applications, Assisted Living, Skilled Nursing Facility  [Encouraged]  Applications Medicaid, Personal Care Services, [place]  Exercise Interventions   Exercise Discussed/Reviewed Exercise Discussed, Assistive device use and maintanence, Exercise Reviewed, Physical Activity, Weight Managment  [Encouraged]  Physical Activity Discussed/Reviewed Physical  Activity Discussed, Home Exercise Program (HEP), Physical Activity Reviewed, Types of exercise  [Encouraged]  Weight Management Weight maintenance  [Encouraged]  Education Interventions   Education Provided Provided Therapist, sports, Provided Web-based Education, Provided Education  Provided Verbal Education On Nutrition, Mental Health/Coping with Illness, When to see the doctor, [employer], [employer], Medication, Exercise, Applications, Foot Care, Eye Care, Labs  [Encouraged]  Applications Medicaid, Personal Care Services, [place]  Mental Health Interventions   Mental Health Discussed/Reviewed Mental Health Discussed, Anxiety, Depression, Grief and Loss, Substance Abuse, Suicide, Other, Mental Health Reviewed, Coping Strategies, Crisis  [Domestic Violence]  Nutrition Interventions   Nutrition Discussed/Reviewed Nutrition Discussed, Adding fruits and vegetables, Increaing proteins, Decreasing fats, Decreasing salt, Supplmental nutrition, Decreasing sugar intake, Carbohydrate meal planning, Nutrition Reviewed, Fluid intake, Portion sizes  [Encouraged]  Pharmacy Interventions   Pharmacy Dicussed/Reviewed Pharmacy Topics Discussed, Pharmacy Topics Reviewed, Medications and their functions, Medication Adherence, Affording Medications  [Encouraged]  Safety Interventions   Safety Discussed/Reviewed Safety Discussed, Safety Reviewed, Fall Risk, Home Safety  [Encouraged]  Home Safety Assistive Devices, Need for home safety assessment, Refer for community resources  Advanced Directive Interventions   Advanced Directives Discussed/Reviewed Advanced Directives Discussed, Advanced Directives Reviewed     Active Listening & Reflection Utilized.  Verbalization of Feelings Encouraged.  Emotional Support Provided. Feelings of Caregiver Burnout Validated. Caregiver Stress Acknowledged. Caregiver Resources Provided. Caregiver Support Groups Reviewed. Problem Solving Interventions  Indicated. Task-Centered Solutions Employed.   Solution-Focused Strategies Activated. CSW Collaboration with Granddaughter, [name] to Confirm Patient Continues to Reside at [facility] [phone]), for Long-Term Care Placement. CSW Collaboration with Granddaughter, [name] to [place] Continued Adult [employer] Involvement, through [facility] [phone]). CSW Collaboration with Granddaughter, [name] to [employer] of Legal  Guardianship During [employer] on 12/03/2022. CSW Collaboration with Granddaughter, [name] to [employer] with CSW [phone]# [phone]), if She Has Questions, Needs Assistance, or If Additional Social Work Needs Are Identified in [employer].      SDOH assessments and interventions completed:  Yes.  Care Coordination Interventions:  Yes, provided.   Follow up plan: No further intervention required.   Encounter Outcome:  Pt. Visit Completed.   Danford Bad, BSW, MSW, LCSW  Licensed Restaurant manager, fast food [facility]  Mailing [place] [address] [place], [place] [address] Physical Address-[address] [place], [place] [address] Toll Free Main # [phone] Fax # [phone] Cell # [phone] [email or web address]
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
## 2022-12-10 DIAGNOSIS — I10 Essential (primary) hypertension: Secondary | ICD-10-CM | POA: Diagnosis not present

## 2022-12-10 DIAGNOSIS — R2681 Unsteadiness on feet: Secondary | ICD-10-CM | POA: Diagnosis not present

## 2022-12-10 DIAGNOSIS — M6281 Muscle weakness (generalized): Secondary | ICD-10-CM | POA: Diagnosis not present

## 2022-12-10 DIAGNOSIS — E876 Hypokalemia: Secondary | ICD-10-CM | POA: Diagnosis not present

## 2022-12-10 DIAGNOSIS — Z9181 History of falling: Secondary | ICD-10-CM | POA: Diagnosis not present

## 2022-12-10 DIAGNOSIS — G9341 Metabolic encephalopathy: Secondary | ICD-10-CM | POA: Diagnosis not present

## 2022-12-10 DIAGNOSIS — R296 Repeated falls: Secondary | ICD-10-CM | POA: Diagnosis not present

## 2022-12-10 DIAGNOSIS — J439 Emphysema, unspecified: Secondary | ICD-10-CM | POA: Diagnosis not present

## 2022-12-10 DIAGNOSIS — R627 Adult failure to thrive: Secondary | ICD-10-CM | POA: Diagnosis not present

## 2022-12-10 DIAGNOSIS — I2510 Atherosclerotic heart disease of native coronary artery without angina pectoris: Secondary | ICD-10-CM | POA: Diagnosis not present

## 2022-12-10 DIAGNOSIS — J9611 Chronic respiratory failure with hypoxia: Secondary | ICD-10-CM | POA: Diagnosis not present

## 2022-12-10 DIAGNOSIS — E569 Vitamin deficiency, unspecified: Secondary | ICD-10-CM | POA: Diagnosis not present

## 2022-12-11 DIAGNOSIS — I2510 Atherosclerotic heart disease of native coronary artery without angina pectoris: Secondary | ICD-10-CM | POA: Diagnosis not present

## 2022-12-11 DIAGNOSIS — Z9181 History of falling: Secondary | ICD-10-CM | POA: Diagnosis not present

## 2022-12-11 DIAGNOSIS — R2681 Unsteadiness on feet: Secondary | ICD-10-CM | POA: Diagnosis not present

## 2022-12-11 DIAGNOSIS — J439 Emphysema, unspecified: Secondary | ICD-10-CM | POA: Diagnosis not present

## 2022-12-11 DIAGNOSIS — M6281 Muscle weakness (generalized): Secondary | ICD-10-CM | POA: Diagnosis not present

## 2022-12-12 DIAGNOSIS — R451 Restlessness and agitation: Secondary | ICD-10-CM | POA: Diagnosis not present

## 2022-12-12 DIAGNOSIS — R2681 Unsteadiness on feet: Secondary | ICD-10-CM | POA: Diagnosis not present

## 2022-12-12 DIAGNOSIS — I2510 Atherosclerotic heart disease of native coronary artery without angina pectoris: Secondary | ICD-10-CM | POA: Diagnosis not present

## 2022-12-12 DIAGNOSIS — Z9181 History of falling: Secondary | ICD-10-CM | POA: Diagnosis not present

## 2022-12-12 DIAGNOSIS — J439 Emphysema, unspecified: Secondary | ICD-10-CM | POA: Diagnosis not present

## 2022-12-12 DIAGNOSIS — M6281 Muscle weakness (generalized): Secondary | ICD-10-CM | POA: Diagnosis not present

## 2022-12-12 DIAGNOSIS — G250 Essential tremor: Secondary | ICD-10-CM | POA: Diagnosis not present

## 2022-12-15 DIAGNOSIS — M6281 Muscle weakness (generalized): Secondary | ICD-10-CM | POA: Diagnosis not present

## 2022-12-15 DIAGNOSIS — Z9181 History of falling: Secondary | ICD-10-CM | POA: Diagnosis not present

## 2022-12-15 DIAGNOSIS — R627 Adult failure to thrive: Secondary | ICD-10-CM | POA: Diagnosis not present

## 2022-12-15 DIAGNOSIS — K5900 Constipation, unspecified: Secondary | ICD-10-CM | POA: Diagnosis not present

## 2022-12-15 DIAGNOSIS — G9341 Metabolic encephalopathy: Secondary | ICD-10-CM | POA: Diagnosis not present

## 2022-12-15 DIAGNOSIS — I4891 Unspecified atrial fibrillation: Secondary | ICD-10-CM | POA: Diagnosis not present

## 2022-12-15 DIAGNOSIS — J439 Emphysema, unspecified: Secondary | ICD-10-CM | POA: Diagnosis not present

## 2022-12-15 DIAGNOSIS — R2681 Unsteadiness on feet: Secondary | ICD-10-CM | POA: Diagnosis not present

## 2022-12-15 DIAGNOSIS — J449 Chronic obstructive pulmonary disease, unspecified: Secondary | ICD-10-CM | POA: Diagnosis not present

## 2022-12-15 DIAGNOSIS — I2510 Atherosclerotic heart disease of native coronary artery without angina pectoris: Secondary | ICD-10-CM | POA: Diagnosis not present

## 2022-12-15 DIAGNOSIS — R296 Repeated falls: Secondary | ICD-10-CM | POA: Diagnosis not present

## 2022-12-16 DIAGNOSIS — J439 Emphysema, unspecified: Secondary | ICD-10-CM | POA: Diagnosis not present

## 2022-12-16 DIAGNOSIS — R2681 Unsteadiness on feet: Secondary | ICD-10-CM | POA: Diagnosis not present

## 2022-12-16 DIAGNOSIS — I2510 Atherosclerotic heart disease of native coronary artery without angina pectoris: Secondary | ICD-10-CM | POA: Diagnosis not present

## 2022-12-16 DIAGNOSIS — M6281 Muscle weakness (generalized): Secondary | ICD-10-CM | POA: Diagnosis not present

## 2022-12-16 DIAGNOSIS — Z9181 History of falling: Secondary | ICD-10-CM | POA: Diagnosis not present

## 2022-12-17 DIAGNOSIS — J439 Emphysema, unspecified: Secondary | ICD-10-CM | POA: Diagnosis not present

## 2022-12-17 DIAGNOSIS — M6281 Muscle weakness (generalized): Secondary | ICD-10-CM | POA: Diagnosis not present

## 2022-12-17 DIAGNOSIS — R2681 Unsteadiness on feet: Secondary | ICD-10-CM | POA: Diagnosis not present

## 2022-12-17 DIAGNOSIS — I2510 Atherosclerotic heart disease of native coronary artery without angina pectoris: Secondary | ICD-10-CM | POA: Diagnosis not present

## 2022-12-17 DIAGNOSIS — Z9181 History of falling: Secondary | ICD-10-CM | POA: Diagnosis not present

## 2022-12-18 DIAGNOSIS — M6281 Muscle weakness (generalized): Secondary | ICD-10-CM | POA: Diagnosis not present

## 2022-12-18 DIAGNOSIS — I2510 Atherosclerotic heart disease of native coronary artery without angina pectoris: Secondary | ICD-10-CM | POA: Diagnosis not present

## 2022-12-18 DIAGNOSIS — Z9181 History of falling: Secondary | ICD-10-CM | POA: Diagnosis not present

## 2022-12-18 DIAGNOSIS — J439 Emphysema, unspecified: Secondary | ICD-10-CM | POA: Diagnosis not present

## 2022-12-18 DIAGNOSIS — R2681 Unsteadiness on feet: Secondary | ICD-10-CM | POA: Diagnosis not present

## 2022-12-19 DIAGNOSIS — K5900 Constipation, unspecified: Secondary | ICD-10-CM | POA: Diagnosis not present

## 2022-12-19 DIAGNOSIS — R627 Adult failure to thrive: Secondary | ICD-10-CM | POA: Diagnosis not present

## 2022-12-19 DIAGNOSIS — R2681 Unsteadiness on feet: Secondary | ICD-10-CM | POA: Diagnosis not present

## 2022-12-19 DIAGNOSIS — I4891 Unspecified atrial fibrillation: Secondary | ICD-10-CM | POA: Diagnosis not present

## 2022-12-19 DIAGNOSIS — Z9181 History of falling: Secondary | ICD-10-CM | POA: Diagnosis not present

## 2022-12-19 DIAGNOSIS — J449 Chronic obstructive pulmonary disease, unspecified: Secondary | ICD-10-CM | POA: Diagnosis not present

## 2022-12-19 DIAGNOSIS — M6281 Muscle weakness (generalized): Secondary | ICD-10-CM | POA: Diagnosis not present

## 2022-12-19 DIAGNOSIS — R296 Repeated falls: Secondary | ICD-10-CM | POA: Diagnosis not present

## 2022-12-19 DIAGNOSIS — G9341 Metabolic encephalopathy: Secondary | ICD-10-CM | POA: Diagnosis not present

## 2022-12-19 DIAGNOSIS — I2510 Atherosclerotic heart disease of native coronary artery without angina pectoris: Secondary | ICD-10-CM | POA: Diagnosis not present

## 2022-12-19 DIAGNOSIS — J439 Emphysema, unspecified: Secondary | ICD-10-CM | POA: Diagnosis not present

## 2022-12-20 DIAGNOSIS — Z9181 History of falling: Secondary | ICD-10-CM | POA: Diagnosis not present

## 2022-12-20 DIAGNOSIS — R2681 Unsteadiness on feet: Secondary | ICD-10-CM | POA: Diagnosis not present

## 2022-12-20 DIAGNOSIS — J439 Emphysema, unspecified: Secondary | ICD-10-CM | POA: Diagnosis not present

## 2022-12-20 DIAGNOSIS — I2510 Atherosclerotic heart disease of native coronary artery without angina pectoris: Secondary | ICD-10-CM | POA: Diagnosis not present

## 2022-12-20 DIAGNOSIS — M6281 Muscle weakness (generalized): Secondary | ICD-10-CM | POA: Diagnosis not present

## 2022-12-21 ENCOUNTER — Emergency Department (HOSPITAL_COMMUNITY)
Admission: EM | Admit: 2022-12-21 | Discharge: 2022-12-22 | Disposition: A | Discharge status: Home / Self Care | Payer: 59 | Attending: Emergency Medicine | Admitting: Emergency Medicine

## 2022-12-21 ENCOUNTER — Emergency Department (HOSPITAL_COMMUNITY): Payer: 59

## 2022-12-21 ENCOUNTER — Other Ambulatory Visit: Payer: Self-pay

## 2022-12-21 ENCOUNTER — Encounter (HOSPITAL_COMMUNITY): Payer: Self-pay | Admitting: Emergency Medicine

## 2022-12-21 DIAGNOSIS — Z7982 Long term (current) use of aspirin: Secondary | ICD-10-CM | POA: Insufficient documentation

## 2022-12-21 DIAGNOSIS — J189 Pneumonia, unspecified organism: Secondary | ICD-10-CM

## 2022-12-21 DIAGNOSIS — Z7951 Long term (current) use of inhaled steroids: Secondary | ICD-10-CM | POA: Insufficient documentation

## 2022-12-21 DIAGNOSIS — R069 Unspecified abnormalities of breathing: Secondary | ICD-10-CM | POA: Diagnosis not present

## 2022-12-21 DIAGNOSIS — R6889 Other general symptoms and signs: Secondary | ICD-10-CM | POA: Diagnosis not present

## 2022-12-21 DIAGNOSIS — J90 Pleural effusion, not elsewhere classified: Secondary | ICD-10-CM | POA: Diagnosis not present

## 2022-12-21 DIAGNOSIS — R0781 Pleurodynia: Secondary | ICD-10-CM | POA: Diagnosis not present

## 2022-12-21 DIAGNOSIS — R079 Chest pain, unspecified: Secondary | ICD-10-CM | POA: Diagnosis not present

## 2022-12-21 DIAGNOSIS — J168 Pneumonia due to other specified infectious organisms: Secondary | ICD-10-CM | POA: Diagnosis not present

## 2022-12-21 DIAGNOSIS — Z743 Need for continuous supervision: Secondary | ICD-10-CM | POA: Diagnosis not present

## 2022-12-21 DIAGNOSIS — R0789 Other chest pain: Secondary | ICD-10-CM | POA: Diagnosis present

## 2022-12-21 MED ORDER — AMOXICILLIN-POT CLAVULANATE 875-125 MG PO TABS: 1.0000 | ORAL_TABLET | Freq: Two times a day (BID) | ORAL | 0 refills | Status: DC

## 2022-12-21 MED ORDER — KETOROLAC TROMETHAMINE 60 MG/2ML IM SOLN
30.0000 mg | Freq: Once | INTRAMUSCULAR | Status: AC
  Administered 2022-12-21: 30 mg via INTRAMUSCULAR
  Filled 2022-12-21: qty 2

## 2022-12-21 MED ORDER — AMOXICILLIN-POT CLAVULANATE 875-125 MG PO TABS
1.0000 | ORAL_TABLET | Freq: Once | ORAL | Status: AC
  Administered 2022-12-21: 1 via ORAL
  Filled 2022-12-21: qty 1

## 2022-12-21 MED ORDER — AZITHROMYCIN 250 MG PO TABS: 250.0000 mg | ORAL_TABLET | Freq: Every day | ORAL | 0 refills | Status: DC

## 2022-12-21 NOTE — ED Triage Notes (Signed)
Pt brought in by EMS from [facility] after pt c/o R. Sided rib pain. Per EMS, staff at facility reported family requested transport for unrelieved rib pain x 3 days despite prn Tylenol at facility. EMS also stated NH reported pt's O2 sats on room air were 87% prior to transport so they placed pt on O2 @ 2l [place] and sats improved to 93%.

## 2022-12-21 NOTE — Discharge Instructions (Signed)
The x-ray does have a subtle hint that there may be a pneumonia in the right lung.  Thankfully the oxygen has been 97 or 98% without any supplemental oxygen blood pressure is normal, heart rate is normal, there is no fever.  I would like for her to be rechecked within 2 to 3 days by the primary care provider that is taking care of her, ER for worsening pain difficulty breathing or fevers.  Please take Augmentin, 1 tablet twice a day for the next 7 days, this is used to treat infections, it treats a variety of infections including animal bites, bacterial infections, and some gastrointestinal infections.   It is related to amoxicillin so do not take this medication if you are allergic to amoxicillin or penicillin.  effects of medications such as antibiotics include diarrhea which may occur as well as potentially inactivating birth control so if you are using a birth control pill please use an alternative form of birth control for the next 2 weeks.  There is occasions where this antibiotic does not work so if you are not improving within 48 hours you will need to be reevaluated immediately by your doctor or in the emergency department if your symptoms are worsening  She is not to take azithromycin while she is taking amiodarone.  Please do not give her azithromycin

## 2022-12-21 NOTE — ED Provider Notes (Signed)
[place] EMERGENCY DEPARTMENT AT [facility] Provider Note   CSN: [id number] Arrival date & time: 12/21/22  2036     History  Chief Complaint  Patient presents with   Rt sided pain    [name] is a 87 y.o. female.  HPI   Patient is an 89 female, she is currently at [facility] nursing facility, evidently the patient has a history of intermittent pneumonia, she has been complaining of some right-sided chest discomfort for the last couple of days, she has been given Tylenol by the nursing staff but continues to complain about it.  There was a report that she may have been some transient hypoxia at the facility that she was transported for evaluation for pneumonia.  The patient is not coughing she is not febrile and she has complaints only of right-sided chest pain which is worse with palpation.  Home Medications Prior to Admission medications   Medication Sig Start Date End Date Taking? Authorizing Provider  amoxicillin-clavulanate (AUGMENTIN) 875-125 MG tablet Take 1 tablet by mouth every 12 (twelve) hours. 12/21/22  Yes Eber Hong, MD  acetaminophen (TYLENOL) 325 MG tablet Take 2 tablets (650 mg total) by mouth every 6 (six) hours as needed for mild pain (or Fever >/= 101). 11/25/22   Emokpae, Courage, MD  albuterol (PROVENTIL) (2.5 MG/3ML) 0.083% nebulizer solution Take 3 mLs (2.5 mg total) by nebulization every 4 (four) hours as needed for wheezing or shortness of breath. 07/02/22 07/02/23  Shon Hale, MD  ALPRAZolam Prudy Feeler) 1 MG tablet Take 1 tablet (1 mg total) by mouth 2 (two) times daily as needed for anxiety. 11/25/22   Shon Hale, MD  amiodarone (PACERONE) 200 MG tablet TAKE 1 TABLET ONCE DAILY ON MONDAY THROUGH SATURDAY, NONE ON SUNDAY. Patient taking differently: Take 200 mg by mouth See admin instructions. TAKE 1 TABLET ONCE DAILY ON MONDAY THROUGH SATURDAY, NONE ON SUNDAY. 08/07/22   Marinus Maw, MD  aspirin EC 81 MG tablet Take 1 tablet (81  mg total) by mouth daily with breakfast. Swallow whole. 07/02/22   Shon Hale, MD  Cyanocobalamin (VITAMIN B-12) 5000 MCG SUBL Take 5,000 mcg by mouth daily.    [provider]  dicyclomine (BENTYL) 10 MG capsule Take 1 capsule (10 mg total) by mouth every 12 (twelve) hours as needed for spasms (abdominal pain). 12/08/22   Dolores Frame, MD  ertugliflozin L-PyroglutamicAc (STEGLATRO) 15 MG TABS tablet Take 15 mg by mouth daily.    [provider]  folic acid (FOLVITE) 1 MG tablet Take 1 tablet (1 mg total) by mouth daily. 11/12/22   Catarina Hartshorn, MD  isosorbide mononitrate (IMDUR) 30 MG 24 hr tablet Take 1 tablet (30 mg total) by mouth daily. 07/02/22   Shon Hale, MD  JARDIANCE 25 MG TABS tablet Take 25 mg by mouth daily.    [provider]  metoprolol succinate (TOPROL-XL) 50 MG 24 hr tablet Take 1 tablet (50 mg total) by mouth 2 (two) times daily. Take with or immediately following a meal. 07/02/22   Emokpae, Courage, MD  Multiple Vitamin (MULTIVITAMIN WITH MINERALS) TABS tablet Take 1 tablet by mouth daily.     [provider]  nitroGLYCERIN (NITROSTAT) 0.4 MG SL tablet Place 1 tablet (0.4 mg total) under the tongue every 5 (five) minutes x 3 doses as needed for chest pain. 03/22/21   Albertine Grates, MD  ondansetron (ZOFRAN) 4 MG tablet Take 4 mg by mouth every 6 (six) hours.  [provider]  ondansetron (ZOFRAN) 4 MG tablet Take 1 tablet (4 mg total) by mouth daily as needed for nausea or vomiting. 11/25/22 11/25/23  Shon Hale, MD  OXYGEN Inhale 2 L into the lungs as needed (PRN as needed at night).    [provider]  Potassium Chloride ER 20 MEQ TBCR Take 1 tablet (20 mEq total) by mouth daily for 4 days. 1 tab daily by mouth 11/25/22 12/08/22  Shon Hale, MD  PROAIR HFA 108 (90 Base) MCG/ACT inhaler Inhale 1 puff into the lungs every 4 (four) hours as needed for wheezing or shortness of breath.  11/10/18   [provider]  ranolazine (RANEXA) 500 MG 12 hr tablet Take 1 tablet (500 mg total) by mouth 2 (two) times daily. 07/02/22   Shon Hale, MD  rosuvastatin (CRESTOR) 40 MG tablet Take 1 tablet (40 mg total) by mouth daily. 07/02/22   Shon Hale, MD  senna-docusate (SENOKOT-S) 8.6-50 MG tablet Take 2 tablets by mouth at bedtime. 11/25/22 11/25/23  Shon Hale, MD  ticagrelor (BRILINTA) 60 MG TABS tablet Take 60 mg by mouth 2 (two) times daily.    [provider]  triamcinolone cream (KENALOG) 0.1 % Apply 1 Application topically daily as needed (for rash/skin irritation). 08/25/22   [provider]  vitamin E 180 MG (400 UNITS) capsule Take 400 Units by mouth daily.    [provider]      Allergies    Amitriptyline hcl, Plavix [clopidogrel bisulfate], and Sulfonamide derivatives    Review of Systems   Review of Systems  All other systems reviewed and are negative.   Physical Exam Updated Vital Signs BP (!) 155/75 (BP Location: Left Arm)   Pulse 76   Temp 97.9 F (36.6 C) (Oral)   Resp (!) 22   Ht 1.575 m (5\' 2" )   Wt 57.4 kg   SpO2 97%   BMI 23.15 kg/m  Physical Exam Vitals and nursing note reviewed.  Constitutional:      General: She is not in acute distress.    Appearance: She is well-developed.  HENT:     Head: Normocephalic and atraumatic.     Mouth/Throat:     Pharynx: No oropharyngeal exudate.  Eyes:     General: No scleral icterus.       Right eye: No discharge.        Left eye: No discharge.     Conjunctiva/sclera: Conjunctivae normal.     Pupils: Pupils are equal, round, and reactive to light.  Neck:     Thyroid: No thyromegaly.     Vascular: No JVD.  Cardiovascular:     Rate and Rhythm: Normal rate and regular rhythm.     Heart sounds: Normal heart sounds. No murmur heard.    No friction rub. No gallop.  Pulmonary:     Effort: Pulmonary effort is normal. No respiratory distress.     Breath sounds: Normal breath  sounds. No wheezing or rales.  Abdominal:     General: Bowel sounds are normal. There is no distension.     Palpations: Abdomen is soft. There is no mass.     Tenderness: There is no abdominal tenderness.  Musculoskeletal:        General: Tenderness present. Normal range of motion.     Cervical back: Normal range of motion and neck supple.     Right lower leg: No edema.     Left lower leg: No edema.  Comments: Mild tenderness over the right lateral rib cage, there is no rash overlying this area, there is no crepitance or subcutaneous emphysema or ecchymosis  Lymphadenopathy:     Cervical: No cervical adenopathy.  Skin:    General: Skin is warm and dry.     Findings: No erythema or rash.  Neurological:     Mental Status: She is alert.     Coordination: Coordination normal.     Comments: Able to lift both legs, use both arms, she has a diffuse tremor of arms and legs.  She is able to open and close her mouth to request  Psychiatric:        Behavior: Behavior normal.     ED Results / Procedures / Treatments   Labs (all labs ordered are listed, but only abnormal results are displayed) Labs Reviewed - No data to display  EKG None  Radiology DG Chest [facility] 1 View  Result Date: 12/21/2022 CLINICAL DATA:  Right-sided chest pain for 3 days EXAM: PORTABLE CHEST 1 VIEW COMPARISON:  11/24/2022 FINDINGS: Single frontal view of the chest demonstrates stable enlargement of the cardiac silhouette and ectasia of the thoracic aorta. Dual lead pacer unchanged. There is chronic interstitial prominence throughout the lungs, with new patchy consolidation at the right lung base and trace right pleural effusion. No pneumothorax. No acute bony abnormalities. IMPRESSION: 1. New patchy right basilar airspace disease and trace right pleural effusion, which could reflect pneumonia or asymmetric edema. 2. Chronic enlarged cardiac silhouette and central vascular congestion. Electronically Signed   By: Sharlet Salina M.D.   On: 12/21/2022 21:46    Procedures Procedures    Medications Ordered in ED Medications  amoxicillin-clavulanate (AUGMENTIN) 875-125 MG per tablet 1 tablet (has no administration in time range)    ED Course/ Medical Decision Making/ A&P                             Medical Decision Making Amount and/or Complexity of Data Reviewed Radiology: ordered. ECG/medicine tests: ordered.  Risk Prescription drug management.   No abnormal lung sounds, cardiac exam is unremarkable, there is tenderness without any other findings on exam to suggest zoster or trauma and there has been no reported falls.  There is no signs of edema of the legs she is not tachycardic and her oxygen is 97% on room air.  Will proceed with chest x-ray and EKG.  EKG performed on Dec 21, 2022 at 8:51 PM shows atrial fibrillation, slight right axis deviation, abnormal T waves, not entirely different from prior EKGs.  Chest x-ray does not fact confirm that the patient has what appears to be a right lower lobe pneumonia.  Her oxygen is 98% on room air, heart rate of 76, afebrile with a blood pressure of 155 systolic.  This patient is very stable appearing, she has been given antibiotics, she will be discharged back to her nursing facility, communication with the nursing facility to let them know about medication management.        Final Clinical Impression(s) / ED Diagnoses Final diagnoses:  Pneumonia of right lung due to infectious organism, unspecified part of lung    Rx / DC Orders ED Discharge Orders          Ordered    amoxicillin-clavulanate (AUGMENTIN) 875-125 MG tablet  Every 12 hours        12/21/22 2219    azithromycin (ZITHROMAX Z-PAK) 250 MG tablet  Daily,   Status:  Discontinued        12/21/22 2219              Eber Hong, MD 12/21/22 2222

## 2022-12-22 ENCOUNTER — Ambulatory Visit: Payer: Medicare Other | Admitting: Cardiology

## 2022-12-23 DIAGNOSIS — R2681 Unsteadiness on feet: Secondary | ICD-10-CM | POA: Diagnosis not present

## 2022-12-23 DIAGNOSIS — J9611 Chronic respiratory failure with hypoxia: Secondary | ICD-10-CM | POA: Diagnosis not present

## 2022-12-23 DIAGNOSIS — J449 Chronic obstructive pulmonary disease, unspecified: Secondary | ICD-10-CM | POA: Diagnosis not present

## 2022-12-23 DIAGNOSIS — I2510 Atherosclerotic heart disease of native coronary artery without angina pectoris: Secondary | ICD-10-CM | POA: Diagnosis not present

## 2022-12-23 DIAGNOSIS — M6281 Muscle weakness (generalized): Secondary | ICD-10-CM | POA: Diagnosis not present

## 2022-12-23 DIAGNOSIS — Z9181 History of falling: Secondary | ICD-10-CM | POA: Diagnosis not present

## 2022-12-23 DIAGNOSIS — I10 Essential (primary) hypertension: Secondary | ICD-10-CM | POA: Diagnosis not present

## 2022-12-23 DIAGNOSIS — J439 Emphysema, unspecified: Secondary | ICD-10-CM | POA: Diagnosis not present

## 2022-12-25 DIAGNOSIS — G8929 Other chronic pain: Secondary | ICD-10-CM | POA: Diagnosis not present

## 2022-12-26 DIAGNOSIS — J449 Chronic obstructive pulmonary disease, unspecified: Secondary | ICD-10-CM | POA: Diagnosis not present

## 2022-12-26 DIAGNOSIS — G250 Essential tremor: Secondary | ICD-10-CM | POA: Diagnosis not present

## 2022-12-26 DIAGNOSIS — R296 Repeated falls: Secondary | ICD-10-CM | POA: Diagnosis not present

## 2022-12-26 DIAGNOSIS — F03B18 Unspecified dementia, moderate, with other behavioral disturbance: Secondary | ICD-10-CM | POA: Diagnosis not present

## 2022-12-26 DIAGNOSIS — Z515 Encounter for palliative care: Secondary | ICD-10-CM | POA: Diagnosis not present

## 2022-12-28 DIAGNOSIS — I2510 Atherosclerotic heart disease of native coronary artery without angina pectoris: Secondary | ICD-10-CM | POA: Diagnosis not present

## 2022-12-28 DIAGNOSIS — R0602 Shortness of breath: Secondary | ICD-10-CM | POA: Diagnosis not present

## 2022-12-29 DIAGNOSIS — J449 Chronic obstructive pulmonary disease, unspecified: Secondary | ICD-10-CM | POA: Diagnosis not present

## 2022-12-29 DIAGNOSIS — R296 Repeated falls: Secondary | ICD-10-CM | POA: Diagnosis not present

## 2022-12-29 DIAGNOSIS — G250 Essential tremor: Secondary | ICD-10-CM | POA: Diagnosis not present

## 2023-01-06 DIAGNOSIS — K219 Gastro-esophageal reflux disease without esophagitis: Secondary | ICD-10-CM | POA: Diagnosis not present

## 2023-01-06 DIAGNOSIS — I10 Essential (primary) hypertension: Secondary | ICD-10-CM | POA: Diagnosis not present

## 2023-01-06 DIAGNOSIS — E569 Vitamin deficiency, unspecified: Secondary | ICD-10-CM | POA: Diagnosis not present

## 2023-01-06 DIAGNOSIS — Z79899 Other long term (current) drug therapy: Secondary | ICD-10-CM | POA: Diagnosis not present

## 2023-01-06 DIAGNOSIS — Z760 Encounter for issue of repeat prescription: Secondary | ICD-10-CM | POA: Diagnosis not present

## 2023-01-09 DIAGNOSIS — R627 Adult failure to thrive: Secondary | ICD-10-CM | POA: Diagnosis not present

## 2023-01-09 DIAGNOSIS — E569 Vitamin deficiency, unspecified: Secondary | ICD-10-CM | POA: Diagnosis not present

## 2023-01-09 DIAGNOSIS — I10 Essential (primary) hypertension: Secondary | ICD-10-CM | POA: Diagnosis not present

## 2023-01-09 DIAGNOSIS — Z760 Encounter for issue of repeat prescription: Secondary | ICD-10-CM | POA: Diagnosis not present

## 2023-01-09 DIAGNOSIS — R296 Repeated falls: Secondary | ICD-10-CM | POA: Diagnosis not present

## 2023-01-09 DIAGNOSIS — Z79899 Other long term (current) drug therapy: Secondary | ICD-10-CM | POA: Diagnosis not present

## 2023-01-09 DIAGNOSIS — K219 Gastro-esophageal reflux disease without esophagitis: Secondary | ICD-10-CM | POA: Diagnosis not present

## 2023-01-12 DIAGNOSIS — I4891 Unspecified atrial fibrillation: Secondary | ICD-10-CM | POA: Diagnosis not present

## 2023-01-12 DIAGNOSIS — E119 Type 2 diabetes mellitus without complications: Secondary | ICD-10-CM | POA: Diagnosis not present

## 2023-01-12 DIAGNOSIS — I2510 Atherosclerotic heart disease of native coronary artery without angina pectoris: Secondary | ICD-10-CM | POA: Diagnosis not present

## 2023-01-12 DIAGNOSIS — I5030 Unspecified diastolic (congestive) heart failure: Secondary | ICD-10-CM | POA: Diagnosis not present

## 2023-01-13 ENCOUNTER — Ambulatory Visit: Payer: Self-pay | Admitting: Nurse Practitioner

## 2023-01-15 ENCOUNTER — Ambulatory Visit (INDEPENDENT_AMBULATORY_CARE_PROVIDER_SITE_OTHER): Payer: 59

## 2023-01-15 DIAGNOSIS — I495 Sick sinus syndrome: Secondary | ICD-10-CM

## 2023-01-15 LAB — CUP PACEART REMOTE DEVICE CHECK
Battery Remaining Longevity: 150 mo
Battery Voltage: 3.16 V
Brady Statistic AP VP Percent: 0.5 %
Brady Statistic AP VS Percent: 59.62 %
Brady Statistic AS VP Percent: 0.51 %
Brady Statistic AS VS Percent: 39.37 %
Brady Statistic RA Percent Paced: 58.32 %
Brady Statistic RV Percent Paced: 1 %
Date Time Interrogation Session: 20240613064202
Implantable Lead Connection Status: 753985
Implantable Lead Connection Status: 753985
Implantable Lead Implant Date: 20100526
Implantable Lead Implant Date: 20100526
Implantable Lead Location: 753859
Implantable Lead Location: 753860
Implantable Lead Model: 5076
Implantable Lead Model: 5076
Implantable Pulse Generator Implant Date: 20231213
Lead Channel Impedance Value: 342 Ohm
Lead Channel Impedance Value: 380 Ohm
Lead Channel Impedance Value: 399 Ohm
Lead Channel Impedance Value: 418 Ohm
Lead Channel Pacing Threshold Amplitude: 0.875 V
Lead Channel Pacing Threshold Amplitude: 1 V
Lead Channel Pacing Threshold Pulse Width: 0.4 ms
Lead Channel Pacing Threshold Pulse Width: 0.4 ms
Lead Channel Sensing Intrinsic Amplitude: 1.5 mV
Lead Channel Sensing Intrinsic Amplitude: 1.5 mV
Lead Channel Sensing Intrinsic Amplitude: 7.375 mV
Lead Channel Sensing Intrinsic Amplitude: 7.375 mV
Lead Channel Setting Pacing Amplitude: 1.5 V
Lead Channel Setting Pacing Amplitude: 2 V
Lead Channel Setting Pacing Pulse Width: 0.4 ms
Lead Channel Setting Sensing Sensitivity: 1.2 mV
Zone Setting Status: 755011

## 2023-01-19 ENCOUNTER — Emergency Department (HOSPITAL_COMMUNITY)
Admission: EM | Admit: 2023-01-19 | Discharge: 2023-01-19 | Disposition: A | Discharge status: Home / Self Care | Payer: 59 | Attending: Emergency Medicine | Admitting: Emergency Medicine

## 2023-01-19 ENCOUNTER — Encounter (HOSPITAL_COMMUNITY): Payer: Self-pay

## 2023-01-19 ENCOUNTER — Emergency Department (HOSPITAL_COMMUNITY): Payer: 59

## 2023-01-19 ENCOUNTER — Other Ambulatory Visit: Payer: Self-pay

## 2023-01-19 DIAGNOSIS — R0602 Shortness of breath: Secondary | ICD-10-CM | POA: Diagnosis not present

## 2023-01-19 DIAGNOSIS — Z7982 Long term (current) use of aspirin: Secondary | ICD-10-CM | POA: Diagnosis not present

## 2023-01-19 DIAGNOSIS — R531 Weakness: Secondary | ICD-10-CM | POA: Diagnosis not present

## 2023-01-19 DIAGNOSIS — I2510 Atherosclerotic heart disease of native coronary artery without angina pectoris: Secondary | ICD-10-CM | POA: Diagnosis not present

## 2023-01-19 DIAGNOSIS — J449 Chronic obstructive pulmonary disease, unspecified: Secondary | ICD-10-CM | POA: Diagnosis not present

## 2023-01-19 DIAGNOSIS — Z7951 Long term (current) use of inhaled steroids: Secondary | ICD-10-CM | POA: Insufficient documentation

## 2023-01-19 DIAGNOSIS — E119 Type 2 diabetes mellitus without complications: Secondary | ICD-10-CM | POA: Diagnosis not present

## 2023-01-19 DIAGNOSIS — I700 Atherosclerosis of aorta: Secondary | ICD-10-CM | POA: Diagnosis not present

## 2023-01-19 DIAGNOSIS — R040 Epistaxis: Secondary | ICD-10-CM | POA: Insufficient documentation

## 2023-01-19 DIAGNOSIS — Z7401 Bed confinement status: Secondary | ICD-10-CM | POA: Diagnosis not present

## 2023-01-19 DIAGNOSIS — R5381 Other malaise: Secondary | ICD-10-CM | POA: Diagnosis not present

## 2023-01-19 DIAGNOSIS — J439 Emphysema, unspecified: Secondary | ICD-10-CM | POA: Diagnosis not present

## 2023-01-19 DIAGNOSIS — Z743 Need for continuous supervision: Secondary | ICD-10-CM | POA: Diagnosis not present

## 2023-01-19 DIAGNOSIS — R634 Abnormal weight loss: Secondary | ICD-10-CM | POA: Diagnosis not present

## 2023-01-19 DIAGNOSIS — R58 Hemorrhage, not elsewhere classified: Secondary | ICD-10-CM | POA: Diagnosis not present

## 2023-01-19 LAB — CBC
HCT: 34.2 % — ABNORMAL LOW (ref 36.0–46.0)
Hemoglobin: 10.8 g/dL — ABNORMAL LOW (ref 12.0–15.0)
MCH: 29.5 pg (ref 26.0–34.0)
MCHC: 31.6 g/dL (ref 30.0–36.0)
MCV: 93.4 fL (ref 80.0–100.0)
Platelets: 214 10*3/uL (ref 150–400)
RBC: 3.66 MIL/uL — ABNORMAL LOW (ref 3.87–5.11)
RDW: 15.9 % — ABNORMAL HIGH (ref 11.5–15.5)
WBC: 8.3 10*3/uL (ref 4.0–10.5)
nRBC: 0 % (ref 0.0–0.2)

## 2023-01-19 LAB — BASIC METABOLIC PANEL
Anion gap: 7 (ref 5–15)
BUN: 15 mg/dL (ref 8–23)
CO2: 24 mmol/L (ref 22–32)
Calcium: 8.8 mg/dL — ABNORMAL LOW (ref 8.9–10.3)
Chloride: 101 mmol/L (ref 98–111)
Creatinine, Ser: 0.85 mg/dL (ref 0.44–1.00)
GFR, Estimated: >60 mL/min (ref >60)
Glucose, Bld: 116 mg/dL — ABNORMAL HIGH (ref 70–99)
Potassium: 3.1 mmol/L — ABNORMAL LOW (ref 3.5–5.1)
Sodium: 132 mmol/L — ABNORMAL LOW (ref 135–145)

## 2023-01-19 MED ORDER — OXYMETAZOLINE HCL 0.05 % NA SOLN
1.0000 | Freq: Once | NASAL | Status: AC
  Administered 2023-01-19: 1 via NASAL
  Filled 2023-01-19: qty 30

## 2023-01-19 MED ORDER — TRANEXAMIC ACID FOR EPISTAXIS
500.0000 mg | Freq: Once | TOPICAL | Status: DC
  Filled 2023-01-19: qty 10

## 2023-01-19 NOTE — ED Notes (Signed)
PA at bedside assessing patient.

## 2023-01-19 NOTE — ED Notes (Signed)
Notified [place] [employer] of patient needing transportation back to [facility].
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
## 2023-01-19 NOTE — ED Provider Notes (Signed)
[place] [facility]ERGENCY DEPARTMENT AT [facility] Provider Note   CSN: [id number] Arrival date & time: 01/19/23  1443     History  Chief Complaint  Patient presents with   Epistaxis   HPI [name] is a 87 y.o. female with history of COPD, anemia, atrial fibrillation, CAD s/p coronary stent placement presenting for epistaxis.  States she has had intermittent nosebleeding for the last 3 days.  Bleeding is coming from the left side of her nose.  States she intermittently uses oxygen and reports that the bleeding is usually worse after she uses the oxygen.  States that sometimes she feels like blood runs down the back of her throat causing her to cough up blood times.  States she has felt more lightheaded last day.  Endorses shortness of breath all the time but states it has been worse since her nurse has been bleeding.  Also endorsing lightheadedness and fatigue. Denies chest pain.  Is on a daily aspirin and Brilinta.   Epistaxis      Home Medications Prior to Admission medications   Medication Sig Start Date End Date Taking? Authorizing Provider  acetaminophen (TYLENOL) 325 MG tablet Take 2 tablets (650 mg total) by mouth every 6 (six) hours as needed for mild pain (or Fever >/= 101). 11/25/22   Emokpae, Courage, MD  albuterol (PROVENTIL) (2.5 MG/3ML) 0.083% nebulizer solution Take 3 mLs (2.5 mg total) by nebulization every 4 (four) hours as needed for wheezing or shortness of breath. 07/02/22 07/02/23  Shon Hale, MD  ALPRAZolam Prudy Feeler) 1 MG tablet Take 1 tablet (1 mg total) by mouth 2 (two) times daily as needed for anxiety. 11/25/22   Shon Hale, MD  amiodarone (PACERONE) 200 MG tablet TAKE 1 TABLET ONCE DAILY ON MONDAY THROUGH SATURDAY, NONE ON SUNDAY. Patient taking differently: Take 200 mg by mouth See admin instructions. TAKE 1 TABLET ONCE DAILY ON MONDAY THROUGH SATURDAY, NONE ON SUNDAY. 08/07/22   Marinus Maw, MD  amoxicillin-clavulanate (AUGMENTIN)  875-125 MG tablet Take 1 tablet by mouth every 12 (twelve) hours. 12/21/22   Eber Hong, MD  aspirin EC 81 MG tablet Take 1 tablet (81 mg total) by mouth daily with breakfast. Swallow whole. 07/02/22   Shon Hale, MD  Cyanocobalamin (VITAMIN B-12) 5000 MCG SUBL Take 5,000 mcg by mouth daily.    [provider]  dicyclomine (BENTYL) 10 MG capsule Take 1 capsule (10 mg total) by mouth every 12 (twelve) hours as needed for spasms (abdominal pain). 12/08/22   Dolores Frame, MD  ertugliflozin L-PyroglutamicAc (STEGLATRO) 15 MG TABS tablet Take 15 mg by mouth daily.    [provider]  folic acid (FOLVITE) 1 MG tablet Take 1 tablet (1 mg total) by mouth daily. 11/12/22   Catarina Hartshorn, MD  isosorbide mononitrate (IMDUR) 30 MG 24 hr tablet Take 1 tablet (30 mg total) by mouth daily. 07/02/22   Shon Hale, MD  JARDIANCE 25 MG TABS tablet Take 25 mg by mouth daily.    [provider]  metoprolol succinate (TOPROL-XL) 50 MG 24 hr tablet Take 1 tablet (50 mg total) by mouth 2 (two) times daily. Take with or immediately following a meal. 07/02/22   Emokpae, Courage, MD  Multiple Vitamin (MULTIVITAMIN WITH MINERALS) TABS tablet Take 1 tablet by mouth daily.     [provider]  nitroGLYCERIN (NITROSTAT) 0.4 MG SL tablet Place 1 tablet (0.4 mg total) under the tongue every 5 (five) minutes x 3 doses as  needed for chest pain. 03/22/21   Albertine Grates, MD  ondansetron (ZOFRAN) 4 MG tablet Take 4 mg by mouth every 6 (six) hours.    [provider]  ondansetron (ZOFRAN) 4 MG tablet Take 1 tablet (4 mg total) by mouth daily as needed for nausea or vomiting. 11/25/22 11/25/23  Shon Hale, MD  OXYGEN Inhale 2 L into the lungs as needed (PRN as needed at night).    [provider]  Potassium Chloride ER 20 MEQ TBCR Take 1 tablet (20 mEq total) by mouth daily for 4 days. 1 tab daily by mouth 11/25/22 12/08/22  Shon Hale, MD  PROAIR HFA 108 (90 Base)  MCG/ACT inhaler Inhale 1 puff into the lungs every 4 (four) hours as needed for wheezing or shortness of breath.  11/10/18   [provider]  ranolazine (RANEXA) 500 MG 12 hr tablet Take 1 tablet (500 mg total) by mouth 2 (two) times daily. 07/02/22   Shon Hale, MD  rosuvastatin (CRESTOR) 40 MG tablet Take 1 tablet (40 mg total) by mouth daily. 07/02/22   Shon Hale, MD  senna-docusate (SENOKOT-S) 8.6-50 MG tablet Take 2 tablets by mouth at bedtime. 11/25/22 11/25/23  Shon Hale, MD  ticagrelor (BRILINTA) 60 MG TABS tablet Take 60 mg by mouth 2 (two) times daily.    [provider]  triamcinolone cream (KENALOG) 0.1 % Apply 1 Application topically daily as needed (for rash/skin irritation). 08/25/22   [provider]  vitamin E 180 MG (400 UNITS) capsule Take 400 Units by mouth daily.    [provider]      Allergies    Amitriptyline hcl, Plavix [clopidogrel bisulfate], and Sulfonamide derivatives    Review of Systems   Review of Systems  HENT:  Positive for nosebleeds.     Physical Exam Updated Vital Signs BP (!) 160/71   Pulse 69   Temp 97.7 F (36.5 C)   Resp 18   SpO2 100%  Physical Exam Vitals and nursing note reviewed.  HENT:     Head: Normocephalic and atraumatic.     Nose:     Comments: New blood noted in the left nare.  Bleeding source appears to be in the anterior aspect of the left nare.  Not actively bleeding.  Both nares are patent.  Posterior nasopharynx appears normal without blood.    Mouth/Throat:     Mouth: Mucous membranes are moist.  Eyes:     General:        Right eye: No discharge.        Left eye: No discharge.     Conjunctiva/sclera: Conjunctivae normal.  Cardiovascular:     Rate and Rhythm: Normal rate and regular rhythm.     Pulses: Normal pulses.     Heart sounds: Normal heart sounds.  Pulmonary:     Effort: Pulmonary effort is normal.     Breath sounds: Normal breath sounds.  Abdominal:      General: Abdomen is flat.     Palpations: Abdomen is soft.  Skin:    General: Skin is warm and dry.  Neurological:     General: No focal deficit present.  Psychiatric:        Mood and Affect: Mood normal.     ED Results / Procedures / Treatments   Labs (all labs ordered are listed, but only abnormal results are displayed) Labs Reviewed  CBC - Abnormal; Notable for the following components:      Result Value  RBC 3.66 (*)    Hemoglobin 10.8 (*)    HCT 34.2 (*)    RDW 15.9 (*)    All other components within normal limits  BASIC METABOLIC PANEL - Abnormal; Notable for the following components:   Sodium 132 (*)    Potassium 3.1 (*)    Glucose, Bld 116 (*)    Calcium 8.8 (*)    All other components within normal limits    EKG None  Radiology DG Chest 1 View  Result Date: 01/19/2023 CLINICAL DATA:  Shortness of breath. Frequent recent epistaxis possibly related to oxygen tubing and flow rate (4 L by nasal cannula). EXAM: CHEST  1 VIEW COMPARISON:  12/21/2022 FINDINGS: Dual lead pacer remains in place. Bony demineralization. Atherosclerotic calcification of the aortic arch. Heart size within normal limits. Stable mild scarring or atelectasis peripherally along the right lung base. Dense mitral calcification. Tapering of the peripheral pulmonary vasculature favors emphysema. Apparent angulation between the left humeral head and metaphysis is probably related to orientation, consider dedicated shoulder radiographs to exclude a new proximal humeral fracture. IMPRESSION: 1. Stable mild scarring or atelectasis peripherally along the right lung base. 2. Tapering of the peripheral pulmonary vasculature favors emphysema. 3. Apparent angulation between the left humeral head and metaphysis is probably related to orientation, consider dedicated shoulder radiographs to exclude a new proximal humeral fracture. Electronically Signed   By: Gaylyn Rong M.D.   On: 01/19/2023 17:53     Procedures Procedures    Medications Ordered in ED Medications  oxymetazoline (AFRIN) 0.05 % nasal spray 1 spray (1 spray Each Nare Given 01/19/23 1812)    ED Course/ Medical Decision Making/ A&P Clinical Course as of 01/20/23 2148  Mon Jan 19, 2023  1810 DG Chest 1 View [JR]    Clinical Course User Index [JR] Gareth Eagle, PA-C                             Medical Decision Making Amount and/or Complexity of Data Reviewed Labs: ordered. Radiology: ordered. Decision-making details documented in ED Course.  Risk OTC drugs.   87 year old well-appearing female presenting for epistaxis.  Exam revealed concern for anterior epistaxis in the left nare with no evidence of active bleeding.  Labs did indicate anemia but patient appears overall clinically well.  Her nose did begin to bleed minimally.  Treated with Afrin and direct pressure and bleeding subsided.  Sent home with Afrin.  Advised to follow-up with her PCP for recheck of her blood work and reevaluation of her anterior epistaxis.  Vitals stable at discharge.  Discharged home in good condition.        Final Clinical Impression(s) / ED Diagnoses Final diagnoses:  Anterior epistaxis    Rx / DC Orders ED Discharge Orders     None         Gareth Eagle, PA-C 01/20/23 2149    Edwin Dada P, DO 01/26/23 1229

## 2023-01-19 NOTE — ED Notes (Signed)
Patient verbalizes understanding of discharge instructions. Opportunity for questioning and answers were provided. Armband removed by staff, pt discharged from ED. Left via [facility] back to facility

## 2023-01-19 NOTE — Discharge Instructions (Addendum)
Evaluation today revealed that you have a nosebleed in the anterior aspect of your nose. You can continue to apply the Afrin as needed.  You can also apply direct pressure.  Please do not pick your nose as it will precipitate worsening bleeding.  If you have new bleeding, or worsening shortness of breath, chest pain or fatigue or any other concerning symptom please return emerged part for further evaluation.  Recommend you do follow-up with your PCP for recheck of your labs in the next 48 to 72 hours otherwise.

## 2023-01-19 NOTE — ED Notes (Signed)
Pt family requesting to speak with EDP. PA notified.

## 2023-01-19 NOTE — ED Triage Notes (Signed)
Pt BIB [facility] from [facility] C/O intermittent nosebleeds X 3 days. Pt reports nosebleeds happen when she wears her oxygen (4L [place]) and the blood runs down the back of her throat, making her cough the blood up. Not wearing oxygen on arrival. No blood noted in nares on arrival.

## 2023-01-26 DIAGNOSIS — I4891 Unspecified atrial fibrillation: Secondary | ICD-10-CM | POA: Diagnosis not present

## 2023-01-26 DIAGNOSIS — R634 Abnormal weight loss: Secondary | ICD-10-CM | POA: Diagnosis not present

## 2023-01-28 DIAGNOSIS — I2510 Atherosclerotic heart disease of native coronary artery without angina pectoris: Secondary | ICD-10-CM | POA: Diagnosis not present

## 2023-01-28 DIAGNOSIS — R0602 Shortness of breath: Secondary | ICD-10-CM | POA: Diagnosis not present

## 2023-02-03 NOTE — Progress Notes (Signed)
Remote pacemaker transmission.   

## 2023-02-04 DIAGNOSIS — R1312 Dysphagia, oropharyngeal phase: Secondary | ICD-10-CM | POA: Diagnosis not present

## 2023-02-04 DIAGNOSIS — I2510 Atherosclerotic heart disease of native coronary artery without angina pectoris: Secondary | ICD-10-CM | POA: Diagnosis not present

## 2023-02-06 ENCOUNTER — Encounter: Payer: Self-pay | Admitting: Nurse Practitioner

## 2023-02-06 ENCOUNTER — Ambulatory Visit: Payer: 59 | Attending: Cardiology | Admitting: Nurse Practitioner

## 2023-02-06 VITALS — BP 130/64 | HR 81 | Ht 65.5 in | Wt 115.4 lb

## 2023-02-06 DIAGNOSIS — E785 Hyperlipidemia, unspecified: Secondary | ICD-10-CM

## 2023-02-06 DIAGNOSIS — I77819 Aortic ectasia, unspecified site: Secondary | ICD-10-CM

## 2023-02-06 DIAGNOSIS — R627 Adult failure to thrive: Secondary | ICD-10-CM

## 2023-02-06 DIAGNOSIS — I38 Endocarditis, valve unspecified: Secondary | ICD-10-CM | POA: Diagnosis not present

## 2023-02-06 DIAGNOSIS — Z79899 Other long term (current) drug therapy: Secondary | ICD-10-CM

## 2023-02-06 DIAGNOSIS — F03918 Unspecified dementia, unspecified severity, with other behavioral disturbance: Secondary | ICD-10-CM | POA: Diagnosis not present

## 2023-02-06 DIAGNOSIS — Z950 Presence of cardiac pacemaker: Secondary | ICD-10-CM

## 2023-02-06 DIAGNOSIS — R634 Abnormal weight loss: Secondary | ICD-10-CM

## 2023-02-06 DIAGNOSIS — R296 Repeated falls: Secondary | ICD-10-CM

## 2023-02-06 DIAGNOSIS — I495 Sick sinus syndrome: Secondary | ICD-10-CM

## 2023-02-06 DIAGNOSIS — I480 Paroxysmal atrial fibrillation: Secondary | ICD-10-CM

## 2023-02-06 DIAGNOSIS — I358 Other nonrheumatic aortic valve disorders: Secondary | ICD-10-CM | POA: Diagnosis not present

## 2023-02-06 DIAGNOSIS — E876 Hypokalemia: Secondary | ICD-10-CM | POA: Diagnosis not present

## 2023-02-06 DIAGNOSIS — I2510 Atherosclerotic heart disease of native coronary artery without angina pectoris: Secondary | ICD-10-CM

## 2023-02-06 MED ORDER — ATORVASTATIN CALCIUM 80 MG PO TABS: 80.0000 mg | ORAL_TABLET | Freq: Every day | ORAL | 1 refills | Status: DC

## 2023-02-06 MED ORDER — NITROGLYCERIN 0.4 MG SL SUBL: 0.4000 mg | SUBLINGUAL_TABLET | SUBLINGUAL | 12 refills | Status: DC | PRN

## 2023-02-06 MED ORDER — POTASSIUM CHLORIDE ER 20 MEQ PO TBCR: 20.0000 meq | EXTENDED_RELEASE_TABLET | Freq: Every day | ORAL | 2 refills | Status: DC

## 2023-02-06 NOTE — Progress Notes (Signed)
Cardiology Office Note:  .   Date:  02/06/2023  ID:  [name], DOB 1935/12/14, MRN [id number] PCP: [name], MD  [facility] Providers Cardiologist:  Nona Dell, MD Electrophysiologist:  Lewayne Bunting, MD    History of Present Illness: .   [name] is a 87 y.o. female with a PMH of multivessel CAD, s/p complex PCI of RCA, history of past stents and shockwave lithotripsy, PAF, sinus node dysfunction, s/p PPM (follows Dr. Ladona Ridgel), COPD, hypertension, hyperlipidemia, history of past pericardial effusion, GERD, hiatal hernia, IPMN, dementia, and anemia, who presents today for scheduled follow-up.  Underwent complex PCI to RCA 05/2021, shockwave lithotripsy and angioplasty with DES x 2 to mid to distal RCA, angioplasty alone of ostial/proximal RCA with in-stent restenosis.  Previously felt to be poor operative candidate.  Last seen by Dr. Diona Browner on June 16, 2022.  Patient noted chronic fatigue and lack of energy.  Underwent generator change out 07/2022 with Dr. Ladona Ridgel.  In the interim, she has had multiple ED visits and admissions.  In March 2024 was admitted for CAP. Amiodarone held due to prolonged QT interval.  She has suspected pancreatic cancer, recommended to continue surveillance and observation at the time.  Plan will be to repeat a CTA pancreatic study in the future.  Palliative medicine consulted.    History of frequent falls and syncopal episode 4/4, felt to be vasovagal. Admitted [name] 2024 with strokelike symptoms, noted to have acute metabolic encephalopathy. R/o for stroke.   Re-admitted 11/2022 for acute metabolic encephalopathy and hypokalemia. Recent ED visit 12/2022 for PNA and 01/2023 for anterior epistaxis.   She presents for scheduled follow-up today. She is a poor historian d/t hx of dementia that appears to be progressing, wondering if this is Alzheimer's. Pt states she has had weight loss this year. Per review of records, since the last time she has  been seen in this office, she has lost around 30 lbs unintentionally. Denies any recent chest pain, shortness of breath, palpitations, syncope, presyncope, dizziness, orthopnea, PND, swelling, acute bleeding, or claudication.  Spoke with guardian, [name] (listed on DPR) says that she has noticed her dementia symptoms recently, including: Confusion, stated she tried to escape the [facility] Niece while being transported for office visit today.   Studies Reviewed: .    Echo 06/2022:  1. Left ventricular ejection fraction, by estimation, is 55 to 60%. The  left ventricle has normal function. The left ventricle has no regional  wall motion abnormalities. Left ventricular diastolic function could not  be evaluated.   2. Right ventricular systolic function not well visualized but appears to  be mildy reduced. The right ventricular size is mildly enlarged. There is  normal pulmonary artery systolic pressure.   3. Left atrial size was moderately dilated.   4. Right atrial size was moderately dilated.   5. The mitral valve is abnormal. Mild to moderate mitral valve  regurgitation. No evidence of mitral stenosis. Moderate mitral annular  calcification.   6. There is restricted mobility of left coronary cusp compared to right  and non-coronary cusps.. The aortic valve is tricuspid. There is moderate  calcification of the aortic valve. Aortic valve regurgitation is mild. No  aortic stenosis is present.   7. Aortic dilatation noted. There is mild dilatation of the aortic root,  measuring 38 mm.   8. The inferior vena cava is dilated in size with >50% respiratory  variability, suggesting right atrial pressure of 8 mmHg.  Comparison(s): No significant change from prior study. Prior images  reviewed side by side.  Coronary stent intervention 05/2021:   ---------- IVUS-guided PTCA/PCI: Combination SHOCKWAVE LITHOTRIPSY, & SCOREFLEX ANGIOPLASTY with GUIDELINER support-----------   LESION #1 prox RCA lesion is 85%  stenosed distal to previous stent.   Scoring balloon angioplasty was performed following SHOCKWAVE LITHOTRIPSY (3.0 x 12) BALLN (2 runs of 10 pulses) using a BALLN SCOREFLEX 3.0X15.   Lesion #2 mid RCA to Dist RCA lesion is 65% stenosed.   Scoring balloon angioplasty was performed using a BALLN SCOREFLEX 3.0X15.  With Use of GuideLiner Catheter Extension   A drug-eluting stent was successfully placed covering both LESIONS #1 and #2 (overlapping previous stent) using a SYNERGY XD 3.0X48.  Postdilated to 3.6 mm   Post intervention, there is a 0% residual stenosis in both these LESIONS #1 and #2.Marland Kitchen   LESION #3 Ost RCA to Prox RCA previously placed stents are 70% stenosed.   Scoring balloon angioplasty was performed using a BALLN SCOREFLEX 3.0X15 -> followed by post dilation with 3.5 mm x 15 mm Juliaetta balloon. ->  Achieved 3.5 mm.   Post intervention, there is a 10% residual in-stent restenosis.   ---------------------------------   Lesion #4 Ost RCA lesion is 75% stenosed.   A drug-eluting stent was successfully placed at the ostium and overlapping previous stent proximally, using a SYNERGY XD 3.50X12.  Postdilated to 3.65 mm.   Post intervention, there is a 0% residual stenosis.   Post intervention, there is a 0% residual stenosis with exception of the 10% residual ISR in the previously placed stents which are likely undersized 2.75 mm stents.   SUMMARY Successful Complext DES PCI of Ostial & mid to Distal RCA using Shockwave Lithotrypsy and ScoreFlex Scoring Balloon Angioplasty with Guideliner Extension Catheter. Mid-Distal RCA tandem lesions: Finally treated with distally overlapping SYNERGY XD (DES) 3.0X48 mm postdilated to 3.6 mm High-pressure score flex angioplasty followed by post dilation of previously placed ostial and proximal RCA stent dilating up to 3.5 mm-residual 10% Ostial RCA PCI with SYNERGY XD 3.5X12. ->  Deployed at 3.65 mm. --> All lesions segments reduced to 0% with exception of the  in-stent restenosis portion which was 10%. --> TIMI-3 flow pre and post.  LHC 05/2021:   Severe calcified diffuse three-vessel coronary artery disease.  Right dominant anatomy.   Nonobstructive left main   Ostial 75% calcified LAD with tandem mid 80% and distal 80% stenoses.   Diffusely diseased circumflex with 75% proximal to mid first obtuse marginal.  Mid to distal circumflex 70% before small second obtuse marginal.   Dominant right coronary with previously placed proximal stent having diffuse 50% ISR, 80 to 90% segmental mid stenosis (probable culprit), and continuation of segmental 60% stenosis to the distal segment where the artery opens up to a 3.0 diameter.  PDA and LV branch are large.   Normal LVEDP.  EF 65%.   RECOMMENDATIONS: Medical therapy versus CABG versus elevated risk RCA PCI.  Consider heart team approach after conversation with the patient and family about level of aggressiveness that they feel is appropriate. Aggressive risk factor modification and anti-ischemic therapy. If RCA PCI, would recommend femoral approach rather than radial for better catheter support during the intervention.  Physical Exam:   VS:  BP 130/64   Pulse 81   Ht 5' 5.5" (1.664 m)   Wt 115 lb 6.4 oz (52.3 kg)   SpO2 95%   BMI 18.91 kg/m    Wt Readings from  Last 3 Encounters:  02/06/23 115 lb 6.4 oz (52.3 kg)  12/21/22 126 lb 8.7 oz (57.4 kg)  12/08/22 126 lb 8 oz (57.4 kg)    GEN: Thin, very frail 87 y.o. female in no acute distress, tremors of head noted NECK: No JVD; No carotid bruits CARDIAC: S1/S2, RRR, no murmurs, rubs, gallops RESPIRATORY:  Clear to auscultation without rales, wheezing or rhonchi  ABDOMEN: Soft, non-tender, non-distended EXTREMITIES:  No edema; No deformity   ASSESSMENT AND PLAN: .    CAD History of complex PCI to RCA including shockwave lithotripsy and angioplasty.  Received 2 drug-eluting stents to the mid to distal RCA, underwent angioplasty to ostial/proximal  RCA with in-stent restenosis noted.  Stable with no anginal symptoms. No indication for ischemic evaluation.  Continue aspirin, Imdur, metoprolol succinate, Brilinta, Ranexa, and will refill nitroglycerin.  Stopping rosuvastatin and starting atorvastatin 80 mg daily-see below.  Heart healthy diet recommended.  PAF Denies any tachycardia or palpitations.  Heart rate well-controlled today.  Continue amiodarone and metoprolol succinate.  She is not on anticoagulation due to history of frequent falls and confusion.  Heart healthy diet encouraged.  SND, s/p PPM Normal device function seen via remote device check last month.  Continue to follow-up with Dr. Ladona Ridgel.  HLD, medication management LDL 149 10/2022.  I am highly suspicious that she is not taking her cholesterol medication at the nursing facility, although she tells me she is "taking what they give me."  Will stop rosuvastatin and begin atorvastatin 80 mg daily.  Will repeat FLP and LFT in 2 months per protocol. Heart healthy diet encouraged.   Aortic dilatation, aortic sclerosis with valvular regurgitation Echocardiogram 06/2022 revealed moderately calcified aortic valve with mild aortic valve regurgitation, mild to mod MR.  Mildly dilated aortic root measuring 38 mm.  Will update echocardiogram 06/2023 for monitoring.  If dementia continues to progress, may need to consider/discuss holding off repeat imaging.   6. Hypokalemia, medication management Last set of labs on January 19, 2023 revealed potassium at 3.1.  Will refill potassium supplement.  Continue potassium chloride 20 mill equivalent daily.  Plan to repeat BMET in 1 week.  7. Dementia, unintentional weight loss, frequent falls, failure to thrive Patient is currently at [facility].  Legal guardian today denies any recent falls.  Continue follow-up with PCP and Palliative Care. Recommended stopping Jardiance (being managed by PCP). If dementia continues to progress, may need to  consider telehealth visits for patient.   Dispo: Follow-up with me or APP in 6 months or sooner if anything changes.  Signed, Sharlene Dory, NP

## 2023-02-06 NOTE — Patient Instructions (Addendum)
Medication Instructions:  Your physician has recommended you make the following change in your medication:  Stop taking Crestor Start taking Atorvastatin 80 Mg  Continue all other medications as prescribed   Labwork: BMET in 1 week at [facility], Hepatic function in 2 months at [facility]   Testing/Procedures: Your physician has requested that you have an echocardiogram. Echocardiography is a painless test that uses sound waves to create images of your heart. It provides your doctor with information about the size and shape of your heart and how well your heart's chambers and valves are working. This procedure takes approximately one hour. There are no restrictions for this procedure. Please do NOT wear cologne, perfume, aftershave, or lotions (deodorant is allowed). Please arrive 15 minutes prior to your appointment time.   Scarlette Calico)  Follow-Up: Your physician recommends that you schedule a follow-up appointment in: 6 Months with Philis Nettle   Any Other Special Instructions Will Be Listed Below (If Applicable).  If you need a refill on your cardiac medications before your next appointment, please call your pharmacy.

## 2023-02-09 DIAGNOSIS — I2510 Atherosclerotic heart disease of native coronary artery without angina pectoris: Secondary | ICD-10-CM | POA: Diagnosis not present

## 2023-02-09 DIAGNOSIS — R1312 Dysphagia, oropharyngeal phase: Secondary | ICD-10-CM | POA: Diagnosis not present

## 2023-02-10 DIAGNOSIS — I2510 Atherosclerotic heart disease of native coronary artery without angina pectoris: Secondary | ICD-10-CM | POA: Diagnosis not present

## 2023-02-10 DIAGNOSIS — R1312 Dysphagia, oropharyngeal phase: Secondary | ICD-10-CM | POA: Diagnosis not present

## 2023-02-11 ENCOUNTER — Telehealth: Payer: Self-pay | Admitting: Nurse Practitioner

## 2023-02-11 NOTE — Telephone Encounter (Signed)
Pt c/o medication issue:  1. Name of Medication: Atorvastatin 80 mg  2. How are you currently taking this medication (dosage and times per day)? 1 time a day  3. Are you having a reaction (difficulty breathing--STAT)?   4. What is your medication issue? Has a rash and itching a lot

## 2023-02-12 ENCOUNTER — Other Ambulatory Visit (HOSPITAL_COMMUNITY)
Admission: RE | Admit: 2023-02-12 | Discharge: 2023-02-12 | Disposition: A | Discharge status: Home / Self Care | Payer: 59 | Source: Ambulatory Visit | Attending: Cardiology | Admitting: Cardiology

## 2023-02-12 DIAGNOSIS — Z79899 Other long term (current) drug therapy: Secondary | ICD-10-CM | POA: Diagnosis not present

## 2023-02-12 DIAGNOSIS — E876 Hypokalemia: Secondary | ICD-10-CM | POA: Insufficient documentation

## 2023-02-12 DIAGNOSIS — I2510 Atherosclerotic heart disease of native coronary artery without angina pectoris: Secondary | ICD-10-CM | POA: Diagnosis not present

## 2023-02-12 DIAGNOSIS — R1312 Dysphagia, oropharyngeal phase: Secondary | ICD-10-CM | POA: Diagnosis not present

## 2023-02-12 LAB — BASIC METABOLIC PANEL
Anion gap: 7 (ref 5–15)
BUN: 13 mg/dL (ref 8–23)
CO2: 25 mmol/L (ref 22–32)
Calcium: 9.3 mg/dL (ref 8.9–10.3)
Chloride: 103 mmol/L (ref 98–111)
Creatinine, Ser: 0.83 mg/dL (ref 0.44–1.00)
GFR, Estimated: >60 mL/min (ref >60)
Glucose, Bld: 99 mg/dL (ref 70–99)
Potassium: 3.5 mmol/L (ref 3.5–5.1)
Sodium: 135 mmol/L (ref 135–145)

## 2023-02-12 NOTE — Telephone Encounter (Signed)
Called Living Facility spoke with Metallurgist) inquired about a rash. She stated that there wasn't any documentation regarding such and patient has complained. Verified medications she was taking. Mentioned a rash but was skin related due to aging. Se stated that she will acall her daughter and speak with her and that a NP and DR will be on site today to check in on patient. She did not think patient was having allergic reaction. Will call back with update

## 2023-02-16 DIAGNOSIS — I2510 Atherosclerotic heart disease of native coronary artery without angina pectoris: Secondary | ICD-10-CM | POA: Diagnosis not present

## 2023-02-16 DIAGNOSIS — R1312 Dysphagia, oropharyngeal phase: Secondary | ICD-10-CM | POA: Diagnosis not present

## 2023-02-17 DIAGNOSIS — R1312 Dysphagia, oropharyngeal phase: Secondary | ICD-10-CM | POA: Diagnosis not present

## 2023-02-17 DIAGNOSIS — I2510 Atherosclerotic heart disease of native coronary artery without angina pectoris: Secondary | ICD-10-CM | POA: Diagnosis not present

## 2023-02-18 DIAGNOSIS — E119 Type 2 diabetes mellitus without complications: Secondary | ICD-10-CM | POA: Diagnosis not present

## 2023-02-18 DIAGNOSIS — I2510 Atherosclerotic heart disease of native coronary artery without angina pectoris: Secondary | ICD-10-CM | POA: Diagnosis not present

## 2023-02-18 DIAGNOSIS — R1312 Dysphagia, oropharyngeal phase: Secondary | ICD-10-CM | POA: Diagnosis not present

## 2023-02-18 DIAGNOSIS — I4891 Unspecified atrial fibrillation: Secondary | ICD-10-CM | POA: Diagnosis not present

## 2023-02-19 DIAGNOSIS — R1312 Dysphagia, oropharyngeal phase: Secondary | ICD-10-CM | POA: Diagnosis not present

## 2023-02-19 DIAGNOSIS — I2510 Atherosclerotic heart disease of native coronary artery without angina pectoris: Secondary | ICD-10-CM | POA: Diagnosis not present

## 2023-02-20 DIAGNOSIS — I2510 Atherosclerotic heart disease of native coronary artery without angina pectoris: Secondary | ICD-10-CM | POA: Diagnosis not present

## 2023-02-20 DIAGNOSIS — R1312 Dysphagia, oropharyngeal phase: Secondary | ICD-10-CM | POA: Diagnosis not present

## 2023-02-25 DIAGNOSIS — R451 Restlessness and agitation: Secondary | ICD-10-CM | POA: Diagnosis not present

## 2023-02-26 DIAGNOSIS — I4891 Unspecified atrial fibrillation: Secondary | ICD-10-CM | POA: Diagnosis not present

## 2023-02-26 DIAGNOSIS — J449 Chronic obstructive pulmonary disease, unspecified: Secondary | ICD-10-CM | POA: Diagnosis not present

## 2023-02-27 DIAGNOSIS — I2510 Atherosclerotic heart disease of native coronary artery without angina pectoris: Secondary | ICD-10-CM | POA: Diagnosis not present

## 2023-02-27 DIAGNOSIS — R1312 Dysphagia, oropharyngeal phase: Secondary | ICD-10-CM | POA: Diagnosis not present

## 2023-02-27 DIAGNOSIS — R0602 Shortness of breath: Secondary | ICD-10-CM | POA: Diagnosis not present

## 2023-03-02 DIAGNOSIS — I4891 Unspecified atrial fibrillation: Secondary | ICD-10-CM | POA: Diagnosis not present

## 2023-03-02 DIAGNOSIS — R627 Adult failure to thrive: Secondary | ICD-10-CM | POA: Diagnosis not present

## 2023-03-02 DIAGNOSIS — I2510 Atherosclerotic heart disease of native coronary artery without angina pectoris: Secondary | ICD-10-CM | POA: Diagnosis not present

## 2023-03-02 DIAGNOSIS — E119 Type 2 diabetes mellitus without complications: Secondary | ICD-10-CM | POA: Diagnosis not present

## 2023-03-02 DIAGNOSIS — R451 Restlessness and agitation: Secondary | ICD-10-CM | POA: Diagnosis not present

## 2023-03-02 DIAGNOSIS — R1312 Dysphagia, oropharyngeal phase: Secondary | ICD-10-CM | POA: Diagnosis not present

## 2023-03-03 DIAGNOSIS — I2510 Atherosclerotic heart disease of native coronary artery without angina pectoris: Secondary | ICD-10-CM | POA: Diagnosis not present

## 2023-03-03 DIAGNOSIS — R1312 Dysphagia, oropharyngeal phase: Secondary | ICD-10-CM | POA: Diagnosis not present

## 2023-03-04 DIAGNOSIS — I2510 Atherosclerotic heart disease of native coronary artery without angina pectoris: Secondary | ICD-10-CM | POA: Diagnosis not present

## 2023-03-04 DIAGNOSIS — R1312 Dysphagia, oropharyngeal phase: Secondary | ICD-10-CM | POA: Diagnosis not present

## 2023-03-05 DIAGNOSIS — R1312 Dysphagia, oropharyngeal phase: Secondary | ICD-10-CM | POA: Diagnosis not present

## 2023-03-05 DIAGNOSIS — J449 Chronic obstructive pulmonary disease, unspecified: Secondary | ICD-10-CM | POA: Diagnosis not present

## 2023-03-05 DIAGNOSIS — M6281 Muscle weakness (generalized): Secondary | ICD-10-CM | POA: Diagnosis not present

## 2023-03-05 DIAGNOSIS — I2510 Atherosclerotic heart disease of native coronary artery without angina pectoris: Secondary | ICD-10-CM | POA: Diagnosis not present

## 2023-03-10 DIAGNOSIS — J449 Chronic obstructive pulmonary disease, unspecified: Secondary | ICD-10-CM | POA: Diagnosis not present

## 2023-03-10 DIAGNOSIS — M6281 Muscle weakness (generalized): Secondary | ICD-10-CM | POA: Diagnosis not present

## 2023-03-10 DIAGNOSIS — I2510 Atherosclerotic heart disease of native coronary artery without angina pectoris: Secondary | ICD-10-CM | POA: Diagnosis not present

## 2023-03-10 DIAGNOSIS — R1312 Dysphagia, oropharyngeal phase: Secondary | ICD-10-CM | POA: Diagnosis not present

## 2023-03-11 ENCOUNTER — Encounter (HOSPITAL_COMMUNITY): Payer: Self-pay | Admitting: *Deleted

## 2023-03-11 ENCOUNTER — Other Ambulatory Visit: Payer: Self-pay

## 2023-03-11 ENCOUNTER — Emergency Department (HOSPITAL_COMMUNITY): Payer: 59

## 2023-03-11 ENCOUNTER — Inpatient Hospital Stay (HOSPITAL_COMMUNITY)
Admission: EM | Admit: 2023-03-11 | Discharge: 2023-03-18 | DRG: 280 | Disposition: A | Discharge status: Skilled Nursing Facility | Payer: 59 | Source: Skilled Nursing Facility | Attending: Internal Medicine | Admitting: Internal Medicine

## 2023-03-11 DIAGNOSIS — Z87891 Personal history of nicotine dependence: Secondary | ICD-10-CM | POA: Diagnosis not present

## 2023-03-11 DIAGNOSIS — I21A1 Myocardial infarction type 2: Secondary | ICD-10-CM | POA: Diagnosis not present

## 2023-03-11 DIAGNOSIS — Z825 Family history of asthma and other chronic lower respiratory diseases: Secondary | ICD-10-CM

## 2023-03-11 DIAGNOSIS — F419 Anxiety disorder, unspecified: Secondary | ICD-10-CM | POA: Diagnosis present

## 2023-03-11 DIAGNOSIS — K219 Gastro-esophageal reflux disease without esophagitis: Secondary | ICD-10-CM | POA: Diagnosis present

## 2023-03-11 DIAGNOSIS — Z955 Presence of coronary angioplasty implant and graft: Secondary | ICD-10-CM | POA: Diagnosis not present

## 2023-03-11 DIAGNOSIS — Z66 Do not resuscitate: Secondary | ICD-10-CM | POA: Diagnosis not present

## 2023-03-11 DIAGNOSIS — K8689 Other specified diseases of pancreas: Secondary | ICD-10-CM | POA: Diagnosis not present

## 2023-03-11 DIAGNOSIS — Z7982 Long term (current) use of aspirin: Secondary | ICD-10-CM

## 2023-03-11 DIAGNOSIS — Z8249 Family history of ischemic heart disease and other diseases of the circulatory system: Secondary | ICD-10-CM

## 2023-03-11 DIAGNOSIS — I110 Hypertensive heart disease with heart failure: Secondary | ICD-10-CM | POA: Diagnosis not present

## 2023-03-11 DIAGNOSIS — J90 Pleural effusion, not elsewhere classified: Secondary | ICD-10-CM | POA: Diagnosis not present

## 2023-03-11 DIAGNOSIS — I509 Heart failure, unspecified: Secondary | ICD-10-CM | POA: Diagnosis not present

## 2023-03-11 DIAGNOSIS — R296 Repeated falls: Secondary | ICD-10-CM | POA: Diagnosis not present

## 2023-03-11 DIAGNOSIS — Z681 Body mass index (BMI) 19 or less, adult: Secondary | ICD-10-CM

## 2023-03-11 DIAGNOSIS — E876 Hypokalemia: Secondary | ICD-10-CM | POA: Diagnosis not present

## 2023-03-11 DIAGNOSIS — I2510 Atherosclerotic heart disease of native coronary artery without angina pectoris: Secondary | ICD-10-CM | POA: Diagnosis present

## 2023-03-11 DIAGNOSIS — R778 Other specified abnormalities of plasma proteins: Secondary | ICD-10-CM | POA: Diagnosis not present

## 2023-03-11 DIAGNOSIS — I5023 Acute on chronic systolic (congestive) heart failure: Secondary | ICD-10-CM | POA: Diagnosis not present

## 2023-03-11 DIAGNOSIS — I480 Paroxysmal atrial fibrillation: Secondary | ICD-10-CM | POA: Diagnosis not present

## 2023-03-11 DIAGNOSIS — Z79899 Other long term (current) drug therapy: Secondary | ICD-10-CM

## 2023-03-11 DIAGNOSIS — I214 Non-ST elevation (NSTEMI) myocardial infarction: Secondary | ICD-10-CM | POA: Diagnosis not present

## 2023-03-11 DIAGNOSIS — R0602 Shortness of breath: Secondary | ICD-10-CM

## 2023-03-11 DIAGNOSIS — Z515 Encounter for palliative care: Secondary | ICD-10-CM | POA: Diagnosis not present

## 2023-03-11 DIAGNOSIS — K862 Cyst of pancreas: Secondary | ICD-10-CM | POA: Diagnosis not present

## 2023-03-11 DIAGNOSIS — R110 Nausea: Secondary | ICD-10-CM

## 2023-03-11 DIAGNOSIS — R079 Chest pain, unspecified: Secondary | ICD-10-CM

## 2023-03-11 DIAGNOSIS — D649 Anemia, unspecified: Secondary | ICD-10-CM | POA: Diagnosis present

## 2023-03-11 DIAGNOSIS — I5031 Acute diastolic (congestive) heart failure: Secondary | ICD-10-CM | POA: Diagnosis not present

## 2023-03-11 DIAGNOSIS — I482 Chronic atrial fibrillation, unspecified: Secondary | ICD-10-CM | POA: Diagnosis present

## 2023-03-11 DIAGNOSIS — Z9849 Cataract extraction status, unspecified eye: Secondary | ICD-10-CM

## 2023-03-11 DIAGNOSIS — Z882 Allergy status to sulfonamides status: Secondary | ICD-10-CM

## 2023-03-11 DIAGNOSIS — E119 Type 2 diabetes mellitus without complications: Secondary | ICD-10-CM | POA: Diagnosis not present

## 2023-03-11 DIAGNOSIS — I499 Cardiac arrhythmia, unspecified: Secondary | ICD-10-CM | POA: Diagnosis not present

## 2023-03-11 DIAGNOSIS — J449 Chronic obstructive pulmonary disease, unspecified: Secondary | ICD-10-CM | POA: Diagnosis present

## 2023-03-11 DIAGNOSIS — I4891 Unspecified atrial fibrillation: Secondary | ICD-10-CM | POA: Diagnosis not present

## 2023-03-11 DIAGNOSIS — R0789 Other chest pain: Secondary | ICD-10-CM | POA: Diagnosis not present

## 2023-03-11 DIAGNOSIS — Z9181 History of falling: Secondary | ICD-10-CM

## 2023-03-11 DIAGNOSIS — I5022 Chronic systolic (congestive) heart failure: Secondary | ICD-10-CM | POA: Diagnosis not present

## 2023-03-11 DIAGNOSIS — H9190 Unspecified hearing loss, unspecified ear: Secondary | ICD-10-CM | POA: Diagnosis present

## 2023-03-11 DIAGNOSIS — Z9981 Dependence on supplemental oxygen: Secondary | ICD-10-CM

## 2023-03-11 DIAGNOSIS — I252 Old myocardial infarction: Secondary | ICD-10-CM | POA: Diagnosis not present

## 2023-03-11 DIAGNOSIS — R6889 Other general symptoms and signs: Secondary | ICD-10-CM | POA: Diagnosis not present

## 2023-03-11 DIAGNOSIS — I10 Essential (primary) hypertension: Secondary | ICD-10-CM | POA: Diagnosis present

## 2023-03-11 DIAGNOSIS — Z950 Presence of cardiac pacemaker: Secondary | ICD-10-CM | POA: Diagnosis not present

## 2023-03-11 DIAGNOSIS — E785 Hyperlipidemia, unspecified: Secondary | ICD-10-CM | POA: Diagnosis present

## 2023-03-11 DIAGNOSIS — R935 Abnormal findings on diagnostic imaging of other abdominal regions, including retroperitoneum: Secondary | ICD-10-CM | POA: Diagnosis not present

## 2023-03-11 DIAGNOSIS — R627 Adult failure to thrive: Secondary | ICD-10-CM | POA: Diagnosis not present

## 2023-03-11 DIAGNOSIS — Z7902 Long term (current) use of antithrombotics/antiplatelets: Secondary | ICD-10-CM

## 2023-03-11 DIAGNOSIS — I517 Cardiomegaly: Secondary | ICD-10-CM | POA: Diagnosis not present

## 2023-03-11 DIAGNOSIS — Z7189 Other specified counseling: Secondary | ICD-10-CM | POA: Diagnosis not present

## 2023-03-11 DIAGNOSIS — R0989 Other specified symptoms and signs involving the circulatory and respiratory systems: Secondary | ICD-10-CM | POA: Diagnosis not present

## 2023-03-11 DIAGNOSIS — R109 Unspecified abdominal pain: Secondary | ICD-10-CM | POA: Diagnosis not present

## 2023-03-11 DIAGNOSIS — Z888 Allergy status to other drugs, medicaments and biological substances status: Secondary | ICD-10-CM

## 2023-03-11 DIAGNOSIS — F0390 Unspecified dementia without behavioral disturbance: Secondary | ICD-10-CM | POA: Diagnosis present

## 2023-03-11 DIAGNOSIS — Z743 Need for continuous supervision: Secondary | ICD-10-CM | POA: Diagnosis not present

## 2023-03-11 DIAGNOSIS — I169 Hypertensive crisis, unspecified: Secondary | ICD-10-CM | POA: Diagnosis not present

## 2023-03-11 DIAGNOSIS — J9611 Chronic respiratory failure with hypoxia: Secondary | ICD-10-CM | POA: Diagnosis present

## 2023-03-11 DIAGNOSIS — Z555 Less than a high school diploma: Secondary | ICD-10-CM

## 2023-03-11 DIAGNOSIS — R7989 Other specified abnormal findings of blood chemistry: Principal | ICD-10-CM

## 2023-03-11 LAB — URINALYSIS, ROUTINE W REFLEX MICROSCOPIC
Bacteria, UA: NONE SEEN
Bilirubin Urine: NEGATIVE
Glucose, UA: >=500 mg/dL — AB
Ketones, ur: NEGATIVE mg/dL
Leukocytes,Ua: NEGATIVE
Nitrite: NEGATIVE
Protein, ur: NEGATIVE mg/dL
Specific Gravity, Urine: 1.014 (ref 1.005–1.030)
pH: 7 (ref 5.0–8.0)

## 2023-03-11 LAB — CBC
HCT: 38.3 % (ref 36.0–46.0)
Hemoglobin: 12.1 g/dL (ref 12.0–15.0)
MCH: 30.3 pg (ref 26.0–34.0)
MCHC: 31.6 g/dL (ref 30.0–36.0)
MCV: 95.8 fL (ref 80.0–100.0)
Platelets: 256 10*3/uL (ref 150–400)
RBC: 4 MIL/uL (ref 3.87–5.11)
RDW: 18.7 % — ABNORMAL HIGH (ref 11.5–15.5)
WBC: 8.4 10*3/uL (ref 4.0–10.5)
nRBC: 0 % (ref 0.0–0.2)

## 2023-03-11 LAB — TROPONIN I (HIGH SENSITIVITY)
Troponin I (High Sensitivity): 199 ng/L (ref <18)
Troponin I (High Sensitivity): 252 ng/L (ref <18)

## 2023-03-11 LAB — COMPREHENSIVE METABOLIC PANEL
ALT: 16 U/L (ref 0–44)
AST: 28 U/L (ref 15–41)
Albumin: 3.4 g/dL — ABNORMAL LOW (ref 3.5–5.0)
Alkaline Phosphatase: 71 U/L (ref 38–126)
Anion gap: 10 (ref 5–15)
BUN: 11 mg/dL (ref 8–23)
CO2: 28 mmol/L (ref 22–32)
Calcium: 9.6 mg/dL (ref 8.9–10.3)
Chloride: 101 mmol/L (ref 98–111)
Creatinine, Ser: 0.72 mg/dL (ref 0.44–1.00)
GFR, Estimated: >60 mL/min (ref >60)
Glucose, Bld: 115 mg/dL — ABNORMAL HIGH (ref 70–99)
Potassium: 4.7 mmol/L (ref 3.5–5.1)
Sodium: 139 mmol/L (ref 135–145)
Total Bilirubin: 1.2 mg/dL (ref 0.3–1.2)
Total Protein: 7.5 g/dL (ref 6.5–8.1)

## 2023-03-11 LAB — LIPASE, BLOOD: Lipase: 30 U/L (ref 11–51)

## 2023-03-11 LAB — BRAIN NATRIURETIC PEPTIDE: B Natriuretic Peptide: 1151 pg/mL — ABNORMAL HIGH (ref 0.0–100.0)

## 2023-03-11 MED ORDER — TICAGRELOR 90 MG PO TABS
90.0000 mg | ORAL_TABLET | Freq: Once | ORAL | Status: AC
  Administered 2023-03-11: 90 mg via ORAL
  Filled 2023-03-11: qty 1

## 2023-03-11 MED ORDER — ALPRAZOLAM 0.5 MG PO TABS
1.0000 mg | ORAL_TABLET | Freq: Once | ORAL | Status: AC
  Administered 2023-03-11: 1 mg via ORAL
  Filled 2023-03-11: qty 2

## 2023-03-11 MED ORDER — NITROGLYCERIN 0.4 MG SL SUBL
0.4000 mg | SUBLINGUAL_TABLET | Freq: Once | SUBLINGUAL | Status: AC
  Administered 2023-03-11: 0.4 mg via SUBLINGUAL
  Filled 2023-03-11: qty 1

## 2023-03-11 MED ORDER — HEPARIN (PORCINE) 25000 UT/250ML-% IV SOLN
800.0000 [IU]/h | INTRAVENOUS | Status: DC
  Administered 2023-03-11: 650 [IU]/h via INTRAVENOUS

## 2023-03-11 MED ORDER — ASPIRIN 325 MG PO TABS
325.0000 mg | ORAL_TABLET | Freq: Once | ORAL | Status: AC
  Administered 2023-03-11: 325 mg via ORAL
  Filled 2023-03-11: qty 1

## 2023-03-11 MED ORDER — HEPARIN BOLUS VIA INFUSION: 4000.0000 [IU] | Freq: Once | INTRAVENOUS | Status: DC

## 2023-03-11 MED ORDER — IOHEXOL 300 MG/ML  SOLN
100.0000 mL | Freq: Once | INTRAMUSCULAR | Status: AC | PRN
  Administered 2023-03-11: 100 mL via INTRAVENOUS

## 2023-03-11 MED ORDER — HEPARIN SODIUM (PORCINE) 1000 UNIT/ML IJ SOLN
3100.0000 [IU] | Freq: Once | INTRAMUSCULAR | Status: AC
  Administered 2023-03-11: 3100 [IU] via INTRAVENOUS
  Filled 2023-03-11: qty 4

## 2023-03-11 MED ORDER — IPRATROPIUM-ALBUTEROL 0.5-2.5 (3) MG/3ML IN SOLN
3.0000 mL | Freq: Once | RESPIRATORY_TRACT | Status: AC
  Administered 2023-03-11: 3 mL via RESPIRATORY_TRACT
  Filled 2023-03-11: qty 3

## 2023-03-11 NOTE — ED Notes (Signed)
Patient alert and oriented.  Explained to patient meds administered.

## 2023-03-11 NOTE — Assessment & Plan Note (Signed)
Initial wheezing on presentation to ED, has resolved. -Nebs.  And scheduled.

## 2023-03-11 NOTE — ED Provider Notes (Signed)
[place] EMERGENCY DEPARTMENT AT [facility] Provider Note   CSN: [id number] Arrival date & time: 03/11/23  1350     History  Chief Complaint  Patient presents with   Abdominal Pain    [name] is a 87 y.o. female. Presenting to the ER with increased SoB compared to baseline. Pt reports associated sx of diaphoresis, generalized abdominal pain, chest pain and abdominal pain. Pt is reporting pmhx of CAD, HLD, HTN, CHF, COPD. Pt reports pacemaker. She states the increased difficulty breathing started this morning when she was sitting down. States taking Brilinta and ASA. Denies chest pain and current abd pain. Denies NVD.    Abdominal Pain Associated symptoms: nausea and shortness of breath   Associated symptoms: no chest pain, no chills, no constipation, no cough, no diarrhea, no dysuria, no fever and no vomiting        Home Medications Prior to Admission medications   Medication Sig Start Date End Date Taking? Authorizing Provider  acetaminophen (TYLENOL) 325 MG tablet Take 2 tablets (650 mg total) by mouth every 6 (six) hours as needed for mild pain (or Fever >/= 101). 11/25/22   Emokpae, Courage, MD  albuterol (PROVENTIL) (2.5 MG/3ML) 0.083% nebulizer solution Take 3 mLs (2.5 mg total) by nebulization every 4 (four) hours as needed for wheezing or shortness of breath. 07/02/22 07/02/23  Shon Hale, MD  ALPRAZolam Prudy Feeler) 1 MG tablet Take 1 tablet (1 mg total) by mouth 2 (two) times daily as needed for anxiety. Patient not taking: Reported on 02/06/2023 11/25/22   Shon Hale, MD  amiodarone (PACERONE) 200 MG tablet TAKE 1 TABLET ONCE DAILY ON MONDAY THROUGH SATURDAY, NONE ON SUNDAY. Patient taking differently: Take 200 mg by mouth See admin instructions. TAKE 1 TABLET ONCE DAILY ON MONDAY THROUGH SATURDAY, NONE ON SUNDAY. 08/07/22   Marinus Maw, MD  amoxicillin-clavulanate (AUGMENTIN) 875-125 MG tablet Take 1 tablet by mouth every 12 (twelve) hours. 12/21/22    Eber Hong, MD  aspirin EC 81 MG tablet Take 1 tablet (81 mg total) by mouth daily with breakfast. Swallow whole. 07/02/22   Shon Hale, MD  atorvastatin (LIPITOR) 80 MG tablet Take 1 tablet (80 mg total) by mouth daily. 02/06/23   Jonelle Sidle, MD  Cyanocobalamin (VITAMIN B-12) 5000 MCG SUBL Take 5,000 mcg by mouth daily.    [provider]  dicyclomine (BENTYL) 10 MG capsule Take 1 capsule (10 mg total) by mouth every 12 (twelve) hours as needed for spasms (abdominal pain). 12/08/22   Dolores Frame, MD  ertugliflozin L-PyroglutamicAc (STEGLATRO) 15 MG TABS tablet Take 15 mg by mouth daily.    [provider]  folic acid (FOLVITE) 1 MG tablet Take 1 tablet (1 mg total) by mouth daily. 11/12/22   Catarina Hartshorn, MD  HYDROcodone-acetaminophen (NORCO) 7.5-325 MG tablet Take 1 tablet by mouth 3 (three) times daily as needed. 12/26/22   [provider]  isosorbide mononitrate (IMDUR) 30 MG 24 hr tablet Take 1 tablet (30 mg total) by mouth daily. 07/02/22   Shon Hale, MD  JARDIANCE 25 MG TABS tablet Take 25 mg by mouth daily. Patient not taking: Reported on 02/06/2023    [provider]  LORazepam (ATIVAN) 0.5 MG tablet Take by mouth.    [provider]  metoprolol succinate (TOPROL-XL) 50 MG 24 hr tablet Take 1 tablet (50 mg total) by mouth 2 (two) times daily. Take with or immediately following a meal. 07/02/22   Emokpae, Courage,  MD  Multiple Vitamin (MULTIVITAMIN WITH MINERALS) TABS tablet Take 1 tablet by mouth daily.     [provider]  nitroGLYCERIN (NITROSTAT) 0.4 MG SL tablet Place 1 tablet (0.4 mg total) under the tongue every 5 (five) minutes x 3 doses as needed for chest pain. 02/06/23   Jonelle Sidle, MD  ondansetron (ZOFRAN) 4 MG tablet Take 4 mg by mouth every 6 (six) hours.    [provider]  ondansetron (ZOFRAN) 4 MG tablet Take 1 tablet (4 mg total) by mouth daily as needed for nausea or  vomiting. Patient not taking: Reported on 02/06/2023 11/25/22 11/25/23  Shon Hale, MD  OXYGEN Inhale 2 L into the lungs as needed (PRN as needed at night).    [provider]  Potassium Chloride ER 20 MEQ TBCR Take 1 tablet (20 mEq total) by mouth daily. 1 tab daily by mouth 02/06/23 11/03/23  Jonelle Sidle, MD  PROAIR HFA 108 [phone] Base) MCG/ACT inhaler Inhale 1 puff into the lungs every 4 (four) hours as needed for wheezing or shortness of breath.  11/10/18   [provider]  ranolazine (RANEXA) 500 MG 12 hr tablet Take 1 tablet (500 mg total) by mouth 2 (two) times daily. 07/02/22   Shon Hale, MD  rOPINIRole (REQUIP) 0.5 MG tablet Take 0.5 mg by mouth daily. 01/20/23   [provider]  senna-docusate (SENOKOT-S) 8.6-50 MG tablet Take 2 tablets by mouth at bedtime. 11/25/22 11/25/23  Shon Hale, MD  ticagrelor (BRILINTA) 60 MG TABS tablet Take 60 mg by mouth 2 (two) times daily.    [provider]  triamcinolone cream (KENALOG) 0.1 % Apply 1 Application topically daily as needed (for rash/skin irritation). 08/25/22   [provider]  vitamin E 180 MG (400 UNITS) capsule Take 400 Units by mouth daily.    [provider]      Allergies    Amitriptyline hcl, Plavix [clopidogrel bisulfate], and Sulfonamide derivatives    Review of Systems   Review of Systems  Constitutional:  Positive for diaphoresis. Negative for chills and fever.  Respiratory:  Positive for chest tightness and shortness of breath. Negative for cough.   Cardiovascular:  Negative for chest pain.  Gastrointestinal:  Positive for abdominal pain and nausea. Negative for abdominal distention, blood in stool, constipation, diarrhea and vomiting.  Genitourinary:  Negative for difficulty urinating and dysuria.    Physical Exam Updated Vital Signs BP (!) 146/86   Pulse 70   Resp (!) 27   SpO2 94%  Physical Exam Constitutional:      Appearance: She is not  diaphoretic.  Cardiovascular:     Rate and Rhythm: Normal rate and regular rhythm.  Pulmonary:     Effort: Accessory muscle usage and prolonged expiration present.     Breath sounds: Wheezing present.  Chest:     Chest wall: No tenderness.  Abdominal:     General: Bowel sounds are normal. There is no distension.     Palpations: Abdomen is soft.     Tenderness: There is abdominal tenderness.     ED Results / Procedures / Treatments   Labs (all labs ordered are listed, but only abnormal results are displayed) Labs Reviewed  CBC  COMPREHENSIVE METABOLIC PANEL  LIPASE, BLOOD  URINALYSIS, ROUTINE W REFLEX MICROSCOPIC  TROPONIN I (HIGH SENSITIVITY)    EKG None  Radiology No results found.  Procedures Procedures    Medications Ordered in ED Medications  ipratropium-albuterol (DUONEB) 0.5-2.5 (3)  MG/3ML nebulizer solution 3 mL (has no administration in time range)    ED Course/ Medical Decision Making/ A&P                                 Medical Decision Making Amount and/or Complexity of Data Reviewed Labs: ordered. Radiology: ordered.  Risk Prescription drug management. Decision regarding hospitalization.   This patient presents to the ED for concern of increased Sob and abd pain this involves an extensive number of treatment options, and is a complaint that carries with it a high risk of complications and morbidity.  The differential diagnosis includes COPD exacerbation, ACS, pneumonia, UTI, diverticulitis   Co morbidities that complicate the patient evaluation  Hx of CAD, COPD, pacemaker, CHF   Additional history obtained:  Additional history obtained from reviewed prior EKG from 12/22/23 and reviewed prior ED note 01/19/23 for pmhx.     Lab Tests:  I Ordered, and personally interpreted labs.  The pertinent results include:   Cbc unremarkable  Cmp unremarkable, glucose 115 Trop 199, repeat Trop ordered -- 252 Lipase unremarkable  Urinalysis --  unremarkable    Imaging Studies ordered:  I ordered imaging studies including Cxray, CT abd/pel  I independently visualized and interpreted imaging which showed  CT abd/pel unremarkable Cxray showing small bilateral pleural effusion and interstital changes  I agree with the radiologist interpretation   Cardiac Monitoring: / EKG:  The patient was maintained on a cardiac monitor.  I personally viewed and interpreted the cardiac monitored which showed an underlying rhythm of: normal sinus w/ prolonged QT interval    Consultations Obtained:  Langston Masker PA-c requested consultation with the IP cardiology for elevated Trop given pmhx,  and discussed lab and imaging findings as well as pertinent plan - Clydie Braun PA spoke with cardio Dr Paulino Rily @8 :45p who recommend: advised hospitalist to admit to [facility], cardiologist will see pt in AM. Recommending medications Brilnta, Heparin bolus, and ASA.  Dr Mariea Clonts will admit to hospital    Problem List / ED Course / Critical interventions / Medication management  Presenting to the ER with increased SoB compared to baseline. Pt reports associated sx of diaphoresis, occasional generalized abdominal pain, and diaphoresis. Reports feeling very anxious. Pt reassessed at 6:20p continues to have difficulty breathing, but wheezing on PE is improved. Pt was put on 2L nasal cannula. Denies CP or abd pain at this time.  I ordered medication including DuoNeb for SoB and Ativan  for anxiety   Reevaluation of the patient after these medicines showed that the patient improved I have reviewed the patients home medicines and have made adjustments as needed   Social Determinants of Health:  Patient from [facility] care facility    Plan: advised to admit to [facility], cardiologist will see pt in AM. Recommending medications Brilnta, Heparin bolus, and ASA. These medications were given.  Dr Mariea Clonts will admit to hospital         Final Clinical Impression(s) / ED  Diagnoses Final diagnoses:  None    Rx / DC Orders ED Discharge Orders     None         Raford Pitcher Evalee Jefferson 03/11/23 2321    Glendora Score, MD 03/12/23 [phone]

## 2023-03-11 NOTE — ED Triage Notes (Signed)
Pt BIB [facility] from [facility] for chest pain that started this am; pt states she has also felt "sick to my stomach"

## 2023-03-11 NOTE — Progress Notes (Signed)
ANTICOAGULATION CONSULT NOTE - Initial Consult  Pharmacy Consult for IV heparin  Indication: chest pain/ACS  Allergies  Allergen Reactions   Amitriptyline Hcl Other (See Comments)    Caused "jaws to twist and lock"   Plavix [Clopidogrel Bisulfate] Hives   Sulfonamide Derivatives Other (See Comments)    UNKNOWN REACTION    Patient Measurements: Height: 5\' 5"  (165.1 cm) Weight: 52.3 kg (115 lb 4.8 oz) IBW/kg (Calculated) : 57 Heparin Dosing Weight: 52.3 kg  Vital Signs: Temp: 98.2 F (36.8 C) (08/07 1829) Temp Source: Rectal (08/07 1829) BP: 119/95 (08/07 2100) Pulse Rate: 65 (08/07 2100)  Labs: Recent Labs    03/11/23 1625 03/11/23 1748  HGB 12.1  --   HCT 38.3  --   PLT 256  --   CREATININE 0.72  --   TROPONINIHS 199* 252*    Estimated Creatinine Clearance: 42.4 mL/min (by C-G formula based on SCr of 0.72 mg/dL).   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  Received bolus of 3100 units at 2055 pm.  On Brilinta 60 mg bid PTA, but no anticoagulants noted.  Goal of Therapy:  Heparin level 0.3-0.7 units/ml Monitor platelets by anticoagulation protocol: Yes   Plan:  Start IV  heparin drip at 650 units/hr. Check heparin level 8 hrs after gtt starts. Daily heparin level and CBC.  Reece Leader, Colon Flattery, BCCP Clinical Pharmacist  03/11/2023 9:23 PM   [facility] pharmacy phone numbers are listed on [email or web address]

## 2023-03-11 NOTE — Assessment & Plan Note (Signed)
Stable. -Resume metoprolol,

## 2023-03-11 NOTE — ED Notes (Signed)
ED TO INPATIENT HANDOFF REPORT  ED Nurse Name and Phone #: Jacques Earthly Name/Age/Gender [name] 87 y.o. female Room/Bed: APA01/APA01  Code Status   Code Status: DNR  Home/SNF/Other [facility] Patient oriented to: self, place, time, and situation Is this baseline? Yes   Triage Complete: Triage complete  Chief Complaint NSTEMI (non-ST elevated myocardial infarction) [facility]) [I21.4]  Triage Note Pt BIB [facility] from [facility] for chest pain that started this am; pt states she has also felt "sick to my stomach"   Allergies Allergies  Allergen Reactions   Amitriptyline Hcl Other (See Comments)    Caused "jaws to twist and lock"   Plavix [Clopidogrel Bisulfate] Hives   Sulfonamide Derivatives Other (See Comments)    UNKNOWN REACTION    Level of Care/Admitting Diagnosis ED Disposition     ED Disposition  Admit   Condition  --   Comment  Hospital Area: [facility] [[id number]]  Level of Care: Telemetry Medical [[id number]]  May place patient in observation at [facility] or [facility] if equivalent level of care is available:: No  Covid Evaluation: Asymptomatic - no recent exposure (last 10 days) testing not required  Diagnosis: NSTEMI (non-ST elevated myocardial infarction) [facility]) [010272]  Admitting Physician: Onnie Boer [[id number]]  Attending Physician: Onnie Boer [email or web address]          B Medical/Surgery History 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    Essential hypertension    GERD (gastroesophageal reflux disease)    H/O hiatal hernia    Headache(784.0)    HOH (hard of hearing)    Hyperlipidemia    Neuromuscular disorder (HCC)    Tremors   Pericardial effusion     Hemorrhagic    Presence of permanent cardiac pacemaker    Tachycardia-bradycardia syndrome [facility])    s/p Medtronic Adapta L dual chamber device  5/10   Past Surgical History:  Procedure Laterality Date   APPENDECTOMY     BIOPSY  02/24/2022   Procedure: BIOPSY;  Surgeon: Lemar Lofty., MD;  Location: [facility]OS[facility];  Service: Gastroenterology;;   CARDIAC CATHETERIZATION  2010   stents x2.   CATARACT EXTRACTION     CHOLECYSTECTOMY     COLONOSCOPY  2011   COLONOSCOPY N/A 10/19/2014   Procedure: COLONOSCOPY;  Surgeon: Malissa Hippo, MD;  [facility];  Service: Endoscopy;  Laterality: N/A;  1030   CORONARY STENT INTERVENTION N/A 05/28/2021   Procedure: CORONARY STENT INTERVENTION;  Surgeon: Marykay Lex, MD;  Location: [facility] INVASIVE CV LAB;  Service: Cardiovascular;  Laterality: N/A;   CORONARY ULTRASOUND/IVUS N/A 05/28/2021   Procedure: Intravascular Ultrasound/IVUS;  Surgeon: Marykay Lex, MD;  Location: [facility] INVASIVE CV LAB;  Service: Cardiovascular;  Laterality: N/A;   ESOPHAGEAL DILATION N/A 05/02/2020   Procedure: ESOPHAGEAL DILATION;  Surgeon: Malissa Hippo, MD;  [facility];  Service: Gastroenterology;  Laterality: N/A;   ESOPHAGOGASTRODUODENOSCOPY N/A 10/26/2014   Procedure: ESOPHAGOGASTRODUODENOSCOPY (EGD);  Surgeon: Malissa Hippo, MD;  [facility];  Service: Endoscopy;  Laterality: N/A;  230 - Dr. has lunch and learn   ESOPHAGOGASTRODUODENOSCOPY N/A 02/24/2022  Procedure: ESOPHAGOGASTRODUODENOSCOPY (EGD);  Surgeon: Lemar Lofty., MD;  Location: [facility] END[facility];  Service: Gastroenterology;  Laterality: N/A;   ESOPHAGOGASTRODUODENOSCOPY (EGD) WITH PROPOFOL N/A 05/02/2020   Procedure: ESOPHAGOGASTRODUODENOSCOPY (EGD) WITH PROPOFOL;  Surgeon: Malissa Hippo, MD;  [facility];  Service: Gastroenterology;  Laterality: N/A;  250   Esophagogastroduodenoscopy with esophageal dilation  2004, 2006, 2007   EUS N/A 02/24/2022    Procedure: UPPER ENDOSCOPIC ULTRASOUND (EUS) LINEAR;  Surgeon: Lemar Lofty., MD;  Location: [facility];  Service: Gastroenterology;  Laterality: N/A;   FINE NEEDLE ASPIRATION  02/24/2022   Procedure: FINE NEEDLE ASPIRATION;  Surgeon: Meridee Score Netty Starring., MD;  Location: [facility] ENDOSCOPY;  Service: Gastroenterology;;  red path sent   GIVENS CAPSULE STUDY N/A 10/31/2014   Procedure: GIVENS CAPSULE STUDY;  Surgeon: Malissa Hippo, MD;  [facility];  Service: Endoscopy;  Laterality: N/A;  730 -- pacemaker--needs monitoring--outpatient bed   INSERT / REPLACE / REMOVE PACEMAKER  2010   LEFT HEART CATH AND CORONARY ANGIOGRAPHY N/A 03/21/2021   Procedure: LEFT HEART CATH AND CORONARY ANGIOGRAPHY;  Surgeon: Lyn Records, MD;  Location: [facility] INVASIVE CV LAB;  Service: Cardiovascular;  Laterality: N/A;   LEFT HEART CATHETERIZATION WITH CORONARY ANGIOGRAM N/A 11/17/2011   Procedure: LEFT HEART CATHETERIZATION WITH CORONARY ANGIOGRAM;  Surgeon: Tonny Bollman, MD;  Location: [facility] CATH LAB;  Service: Cardiovascular;  Laterality: N/A;   LEFT HEART CATHETERIZATION WITH CORONARY ANGIOGRAM N/A 09/26/2014   Procedure: LEFT HEART CATHETERIZATION WITH CORONARY ANGIOGRAM;  Surgeon: Marykay Lex, MD;  Location: [facility] CATH LAB;  Service: Cardiovascular;  Laterality: N/A;   PPM GENERATOR CHANGEOUT N/A 07/16/2022   Procedure: PPM GENERATOR CHANGEOUT;  Surgeon: Marinus Maw, MD;  Location: [facility] INVASIVE CV LAB;  Service: Cardiovascular;  Laterality: N/A;   Right rotator cuff repair     SAVORY DILATION N/A 02/24/2022   Procedure: SAVORY DILATION;  Surgeon: Meridee Score Netty Starring., MD;  Location: [facility];  Service: Gastroenterology;  Laterality: N/A;   SHOULDER ACROMIOPLASTY Right 05/30/2015   Procedure: RIGHT SHOULDER ACROMIOPLASTY;  Surgeon: Vickki Hearing, MD;  Location: [facility] OR[facility];  Service: Orthopedics;  Laterality: Right;   SHOULDER OPEN ROTATOR CUFF REPAIR Right 05/30/2015   Procedure: OPEN  ROTATOR CUFF REPAIR RIGHT SHOULDER;  Surgeon: Vickki Hearing, MD;  Location: [facility];  Service: Orthopedics;  Laterality: Right;   SHOULDER OPEN ROTATOR CUFF REPAIR Left 10/22/2016   Procedure: ROTATOR CUFF REPAIR SHOULDER OPEN;  Surgeon: Vickki Hearing, MD;  Location: [facility];  Service: Orthopedics;  Laterality: Left;   Subxiphoid pericardial window  11/10   VAGINAL HYSTERECTOMY       A IV Location/Drains/Wounds Patient Lines/Drains/Airways Status     Active Line/Drains/Airways     Name Placement date Placement time Site Days   Peripheral IV 03/11/23 20 G Right Antecubital 03/11/23  2055  Antecubital  less than 1   Peripheral IV 03/11/23 22 G Right Wrist 03/11/23  2125  Wrist  less than 1            Intake/Output Last 24 hours  Intake/Output Summary (Last 24 hours) at 03/11/2023 2343 Last data filed at 03/11/2023 1829 Gross per 24 hour  Intake --  Output 13 ml  Net -13 ml    Labs/Imaging Results for orders placed or performed during the hospital encounter of 03/11/23 (from the past 48 hour(s))  CBC     Status: Abnormal   Collection Time: 03/11/23  4:25 PM  Result Value  Ref Range   WBC 8.4 4.0 - 10.5 K/uL   RBC 4.00 3.87 - 5.11 MIL/uL   Hemoglobin 12.1 12.0 - 15.0 g/dL   HCT 16.1 09.6 - 04.5 %   MCV 95.8 80.0 - 100.0 fL   MCH 30.3 26.0 - 34.0 pg   MCHC 31.6 30.0 - 36.0 g/dL   RDW 40.9 (H) 81.1 - 91.4 %   Platelets 256 150 - 400 K/uL   nRBC 0.0 0.0 - 0.2 %    Comment: Performed at [facility], [address] [place], [place] [address]  Troponin I (High Sensitivity)     Status: Abnormal   Collection Time: 03/11/23  4:25 PM  Result Value Ref Range   Troponin I (High Sensitivity) 199 (HH) <18 ng/L    Comment: CRITICAL RESULT CALLED TO, READ BACK BY AND VERIFIED WITH FOWLER,D ON 03/11/23 AT 1705 BY LOY,C (NOTE) Elevated high sensitivity troponin I (hsTnI) values and significant  changes across serial measurements may suggest ACS but many other  chronic and acute  conditions are known to elevate hsTnI results.  Refer to the "Links" section for chest pain algorithms and additional  guidance. Performed at [facility], [address], [place], [place] [address]   Comprehensive metabolic panel     Status: Abnormal   Collection Time: 03/11/23  4:25 PM  Result Value Ref Range   Sodium 139 135 - 145 mmol/L   Potassium 4.7 3.5 - 5.1 mmol/L   Chloride 101 98 - 111 mmol/L   CO2 28 22 - 32 mmol/L   Glucose, Bld 115 (H) 70 - 99 mg/dL    Comment: Glucose reference range applies only to samples taken after fasting for at least 8 hours.   BUN 11 8 - 23 mg/dL   Creatinine, Ser 8.65 0.44 - 1.00 mg/dL   Calcium 9.6 8.9 - 78.4 mg/dL   Total Protein 7.5 6.5 - 8.1 g/dL   Albumin 3.4 (L) 3.5 - 5.0 g/dL   AST 28 15 - 41 U/L   ALT 16 0 - 44 U/L   Alkaline Phosphatase 71 38 - 126 U/L   Total Bilirubin 1.2 0.3 - 1.2 mg/dL   GFR, Estimated >69 >62 mL/min    Comment: (NOTE) Calculated using the CKD-EPI Creatinine Equation (2021)    Anion gap 10 5 - 15    Comment: Performed at [facility], [address] [place], [place] [address]  Lipase, blood     Status: None   Collection Time: 03/11/23  4:25 PM  Result Value Ref Range   Lipase 30 11 - 51 U/L    Comment: Performed at [facility], [address] [place], [place] [address]  Brain natriuretic peptide     Status: Abnormal   Collection Time: 03/11/23  4:25 PM  Result Value Ref Range   B Natriuretic Peptide 1,151.0 (H) 0.0 - 100.0 pg/mL    Comment: Performed at [facility], [address], [place], [place] [address]  Troponin I (High Sensitivity)     Status: Abnormal   Collection Time: 03/11/23  5:48 PM  Result Value Ref Range   Troponin I (High Sensitivity) 252 (HH) <18 ng/L    Comment: DELTA CHECK NOTED CRITICAL VALUE NOTED. VALUE IS CONSISTENT WITH PREVIOUSLY REPORTED/CALLED VALUE (NOTE) Elevated high sensitivity troponin I (hsTnI) values and significant  changes across serial measurements may suggest ACS  but many other  chronic and acute conditions are known to elevate hsTnI results.  Refer to the "Links" section  for chest pain algorithms and additional  guidance. Performed at [facility], [address] [place], [place] [address]   Urinalysis, Routine w reflex microscopic -Urine, Clean Catch     Status: Abnormal   Collection Time: 03/11/23  6:29 PM  Result Value Ref Range   Color, Urine STRAW (A) YELLOW   APPearance CLEAR CLEAR   Specific Gravity, Urine 1.014 1.005 - 1.030   pH 7.0 5.0 - 8.0   Glucose, UA >=500 (A) NEGATIVE mg/dL   Hgb urine dipstick SMALL (A) NEGATIVE   Bilirubin Urine NEGATIVE NEGATIVE   Ketones, ur NEGATIVE NEGATIVE mg/dL   Protein, ur NEGATIVE NEGATIVE mg/dL   Nitrite NEGATIVE NEGATIVE   Leukocytes,Ua NEGATIVE NEGATIVE   RBC / HPF 0-5 0 - 5 RBC/hpf   WBC, UA 0-5 0 - 5 WBC/hpf   Bacteria, UA NONE SEEN NONE SEEN   Squamous Epithelial / HPF 0-5 0 - 5 /HPF    Comment: Performed at [facility], [address], [place], [place] [address]   CT ABDOMEN PELVIS W CONTRAST  Result Date: 03/11/2023 CLINICAL DATA:  Acute generalized abdominal pain. EXAM: CT ABDOMEN AND PELVIS WITH CONTRAST TECHNIQUE: Multidetector CT imaging of the abdomen and pelvis was performed using the standard protocol following bolus administration of intravenous contrast. RADIATION DOSE REDUCTION: This exam was performed according to the departmental dose-optimization program which includes automated exposure control, adjustment of the mA and/or kV according to patient size and/or use of iterative reconstruction technique. CONTRAST:  OMNIPAQUE IOHEXOL 300 MG/ML  SOLN COMPARISON:  November 24, 2022. FINDINGS: Lower chest: Small bilateral pleural effusions are noted with minimal adjacent subsegmental atelectasis. Hepatobiliary: No focal liver abnormality is seen. Status post cholecystectomy. No biliary dilatation. Pancreas: Stable pancreatic ductal dilatation. No acute inflammation is noted. Spleen:  Calcified granulomas are noted in the spleen. Adrenals/Urinary Tract: Adrenal glands are unremarkable. Kidneys are normal, without renal calculi, focal lesion, or hydronephrosis. Bladder is unremarkable. Stomach/Bowel: Stomach is unremarkable. There is no evidence of bowel obstruction or inflammation. Vascular/Lymphatic: Aortic atherosclerosis. No enlarged abdominal or pelvic lymph nodes. Reproductive: Status post hysterectomy. No adnexal masses. Other: No abdominal wall hernia or abnormality. No abdominopelvic ascites. Musculoskeletal: No acute or significant osseous findings. IMPRESSION: Small bilateral pleural effusions with minimal adjacent subsegmental atelectasis. No definite acute abnormality seen in the abdomen or pelvis. Aortic Atherosclerosis (ICD10-I70.0). Electronically Signed   By: Lupita Raider M.D.   On: 03/11/2023 17:52   DG Chest 2 View  Result Date: 03/11/2023 CLINICAL DATA:  Shortness of breath EXAM: CHEST - 2 VIEW COMPARISON:  X-ray 01/19/2023 and older FINDINGS: Enlarged heart with vascular congestion and interstitial changes. Small effusions. Calcified tortuous aorta. Left upper chest pacemaker. No pneumothorax. Osteopenia with degenerative changes. Surgical Burrs along the right humeral head. IMPRESSION: Enlarged heart with calcified tortuous aorta.  Pacemaker. Developing small effusions with the adjacent opacity and interstitial changes. Recommend follow-up Electronically Signed   By: Karen Kays M.D.   On: 03/11/2023 17:44    Pending Labs Unresulted Labs (From admission, onward)     Start     Ordered   03/13/23 0500  Heparin level (unfractionated)  Daily,   R      03/11/23 2202   03/12/23 0600  Heparin level (unfractionated)  Once-Timed,   TIMED        03/11/23 2202   03/12/23 0500  CBC  Daily,   R      03/11/23 2202   Signed and Held  Basic metabolic  panel  Tomorrow morning,   R        Signed and Held            Vitals/Pain Today's Vitals   03/11/23 2200  03/11/23 2230 03/11/23 2259 03/11/23 2309  BP: 136/69 123/78  125/69  Pulse: 80 67  60  Resp: 19 18  18   Temp: 98 F (36.7 C)     TempSrc:      SpO2: 97% 98%  98%  Weight:      Height:      PainSc:   0-No pain     Isolation Precautions No active isolations  Medications Medications  heparin ADULT infusion 100 units/mL (25000 units/228mL) (650 Units/hr Intravenous New Bag/Given 03/11/23 2128)  ipratropium-albuterol (DUONEB) 0.5-2.5 (3) MG/3ML nebulizer solution 3 mL (3 mLs Nebulization Given 03/11/23 1628)  ALPRAZolam (XANAX) tablet 1 mg (1 mg Oral Given 03/11/23 1718)  iohexol (OMNIPAQUE) 300 MG/ML solution 100 mL (100 mLs Intravenous Contrast Given 03/11/23 1726)  nitroGLYCERIN (NITROSTAT) SL tablet 0.4 mg (0.4 mg Sublingual Given 03/11/23 2010)  aspirin tablet 325 mg (325 mg Oral Given 03/11/23 2053)  ticagrelor (BRILINTA) tablet 90 mg (90 mg Oral Given 03/11/23 2053)  heparin sodium (porcine) injection 3,100 Units (3,100 Units Intravenous Given 03/11/23 2055)    Mobility non-ambulatory     Focused Assessments    R Recommendations: See Admitting Provider Note  Report given to:   Additional Notes: A&O; Nonambu; Incontinent of urine; 2L O2(baseline is at bedtime); DNR; HOH(L ear is better) 20G RAC; 22G R wrist Heparin 650units/hr

## 2023-03-11 NOTE — ED Notes (Signed)
Grand-daughter called and requesting medical information on patient.  Explained that patient will be transferred to [place] once a bed becomes available.  Explained to grand-daughter that I can't give out medical information over the phone due to no way for me to verfiy that she is legally who she says she is. Grand-daughter gets mad and states"I will be calling my lawyer about this."  Asked grand-daughter if there was anything else I could help her with and she states "you will be hearing from me."

## 2023-03-11 NOTE — ED Notes (Signed)
Patient was completely soiled. This tech and another tech cleaned patient. Placed new sheets, incontinence pad, brief, and gown. Patient is comfortable in bed asking for anxiety medication. Nurse and paramedic aware.

## 2023-03-11 NOTE — Assessment & Plan Note (Signed)
BNP elevated at 1151 from baseline.  No peripheral sign of volume overload, per chart weight today has improved compared to past year.  Chest imaging showing small bilateral pleural effusions.  Last echo 06/2022, EF of 55 to 60%. -IV Lasix 20 mg Po x 1, will hold off on further Lasix dosing

## 2023-03-11 NOTE — Assessment & Plan Note (Addendum)
Presents with chest pain.  Troponin 199 > 252.  EKG unchanged.  History of 3V CAD s/p complex PCI to RCA in 05/2021  - EDP talked to Dr. Gwendlyn Deutscher history, increasing troponin, transfer to [facility] for cardiology consultation, give aspirin load, ticagrelor, initiate heparin drip for presumed NSTEMI - Trend troponin - NPO midnight

## 2023-03-11 NOTE — Treatment Plan (Signed)
Transfer Acceptance Note  87 yo w/history of Dementia, known 3V CAD s/p complex PCI to RCA in 05/2021 who presents with chest pain and nausea. Vitals signs notable for mild hypoxia but otherwise hemodynamically stable. The patient's labs demonstrated initial hstrop of 199 -> 252. EKG without acute ischemia.  Given the patient's history, clinical symptoms and rising troponin, will plan to transfer over to [facility] for cardiology consultation. Recommendations to give aspirin load, ticagrelor 180mg  and initiate heparin gtt for presumed NSTEMI.  Katrina Mayo,  MD MPH [facility]

## 2023-03-11 NOTE — H&P (Signed)
History and Physical    ELENOA SPRAGGINS ZOX:[id number] DOB: January 31, 1936 DOA: 03/11/2023  PCP: [name], MD   Patient coming from: [facility]  I have personally briefly reviewed patient's old medical records in [facility]  Chief Complaint: Chest Pain  HPI: JOANY HASLETT is a 87 y.o. female with medical history significant for atrial fibrillation, pace maker status, hypertension, coronary artery disease, systolic and diastolic CHF, chronic respiratory failure, COPD. On my evaluation, patient is very hard of hearing, limiting history. Patient presented to ED with complaints of chest pain, difficulty breathing without associated diaphoresis today.  She reports she was sitting down when chest pain started.  Chest pain has resolved.  She reported some abdominal pain-which is right-sided and ongoing.  ED Course: O2 sats 88% on room air, placed on 2 L sats greater than 94%.  Respiratory rate 14-29.  Blood pressure systolic 103-1 40s. BNP elevated at 1151. Troponin 199 >> 152.  EKG without changes. Two-view chest x-ray shows developing small effusions with recent opacity and interstitial changes. CT abdomen and pelvis shows small bilateral pleural effusions, no acute abdominal pelvic abnormality. EDP-talked to cardiology- Dr. Mickey Farber [facility], give aspirin load, ticagrelor, and initiate heparin drip for presumed NSTEMI.  Review of Systems: As per HPI all other systems reviewed and negative.  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    Essential hypertension    GERD (gastroesophageal reflux disease)    H/O hiatal hernia    Headache(784.0)    HOH (hard of hearing)    Hyperlipidemia    Neuromuscular disorder (HCC)     Tremors   Pericardial effusion    Hemorrhagic    Presence of permanent cardiac pacemaker    Tachycardia-bradycardia syndrome (HCC)    s/p Medtronic Adapta L dual chamber device  5/10    Past Surgical History:  Procedure Laterality Date   APPENDECTOMY     BIOPSY  02/24/2022   Procedure: BIOPSY;  Surgeon: Lemar Lofty., MD;  Location: [facility];  Service: Gastroenterology;;   CARDIAC CATHETERIZATION  2010   stents x2.   CATARACT EXTRACTION     CHOLECYSTECTOMY     COLONOSCOPY  2011   COLONOSCOPY N/A 10/19/2014   Procedure: COLONOSCOPY;  Surgeon: Malissa Hippo, MD;  Location: [facility];  Service: Endoscopy;  Laterality: N/A;  1030   CORONARY STENT INTERVENTION N/A 05/28/2021   Procedure: CORONARY STENT INTERVENTION;  Surgeon: Marykay Lex, MD;  Location: [facility] INVASIVE CV LAB;  Service: Cardiovascular;  Laterality: N/A;   CORONARY ULTRASOUND/IVUS N/A 05/28/2021   Procedure: Intravascular Ultrasound/IVUS;  Surgeon: Marykay Lex, MD;  Location: [facility] INVASIVE CV LAB;  Service: Cardiovascular;  Laterality: N/A;   ESOPHAGEAL DILATION N/A 05/02/2020   Procedure: ESOPHAGEAL DILATION;  Surgeon: Malissa Hippo, MD;  [facility]: [facility];  Service: Gastroenterology;  Laterality: N/A;   ESOPHAGOGASTRODUODENOSCOPY N/A 10/26/2014   Procedure: ESOPHAGOGASTRODUODENOSCOPY (EGD);  Surgeon: Malissa Hippo, MD;  [facility];  Service: Endoscopy;  Laterality: N/A;  230 - Dr. has lunch and learn   ESOPHAGOGASTRODUODENOSCOPY N/A 02/24/2022   Procedure: ESOPHAGOGASTRODUODENOSCOPY (EGD);  Surgeon: Lemar Lofty., MD;  Location: [facility];  Service: Gastroenterology;  Laterality: N/A;   ESOPHAGOGASTRODUODENOSCOPY (EGD) WITH PROPOFOL N/A 05/02/2020   Procedure: ESOPHAGOGASTRODUODENOSCOPY (EGD) WITH PROPOFOL;  Surgeon: Malissa Hippo, MD;  [facility];  Service: Gastroenterology;  Laterality: N/A;  250   Esophagogastroduodenoscopy with esophageal dilation   2004, 2006, 2007   EUS N/A 02/24/2022   Procedure: UPPER ENDOSCOPIC ULTRASOUND (EUS) LINEAR;  Surgeon: Lemar Lofty., MD;  Location: [facility];  Service: Gastroenterology;  Laterality: N/A;   FINE NEEDLE ASPIRATION  02/24/2022   Procedure: FINE NEEDLE ASPIRATION;  Surgeon: Meridee Score Netty Starring., MD;  Location: [facility];  Service: Gastroenterology;;  red path sent   GIVENS CAPSULE STUDY N/A 10/31/2014   Procedure: GIVENS CAPSULE STUDY;  Surgeon: Malissa Hippo, MD;  [facility];  Service: Endoscopy;  Laterality: N/A;  730 -- pacemaker--needs monitoring--outpatient bed   INSERT / REPLACE / REMOVE PACEMAKER  2010   LEFT HEART CATH AND CORONARY ANGIOGRAPHY N/A 03/21/2021   Procedure: LEFT HEART CATH AND CORONARY ANGIOGRAPHY;  Surgeon: Lyn Records, MD;  Location: [facility] INVASIVE CV LAB;  Service: Cardiovascular;  Laterality: N/A;   LEFT HEART CATHETERIZATION WITH CORONARY ANGIOGRAM N/A 11/17/2011   Procedure: LEFT HEART CATHETERIZATION WITH CORONARY ANGIOGRAM;  Surgeon: Tonny Bollman, MD;  Location: [facility] CATH LAB;  Service: Cardiovascular;  Laterality: N/A;   LEFT HEART CATHETERIZATION WITH CORONARY ANGIOGRAM N/A 09/26/2014   Procedure: LEFT HEART CATHETERIZATION WITH CORONARY ANGIOGRAM;  Surgeon: Marykay Lex, MD;  Location: [facility] CATH LAB;  Service: Cardiovascular;  Laterality: N/A;   PPM GENERATOR CHANGEOUT N/A 07/16/2022   Procedure: PPM GENERATOR CHANGEOUT;  Surgeon: Marinus Maw, MD;  Location: [facility] INVASIVE CV LAB;  Service: Cardiovascular;  Laterality: N/A;   Right rotator cuff repair     SAVORY DILATION N/A 02/24/2022   Procedure: SAVORY DILATION;  Surgeon: Meridee Score Netty Starring., MD;  Location: [facility];  Service: Gastroenterology;  Laterality: N/A;   SHOULDER ACROMIOPLASTY Right 05/30/2015   Procedure: RIGHT SHOULDER ACROMIOPLASTY;  Surgeon: Vickki Hearing, MD;  Location: [facility];  Service: Orthopedics;  Laterality: Right;   SHOULDER OPEN ROTATOR CUFF REPAIR  Right 05/30/2015   Procedure: OPEN ROTATOR CUFF REPAIR RIGHT SHOULDER;  Surgeon: Vickki Hearing, MD;  Location: [facility];  Service: Orthopedics;  Laterality: Right;   SHOULDER OPEN ROTATOR CUFF REPAIR Left 10/22/2016   Procedure: ROTATOR CUFF REPAIR SHOULDER OPEN;  Surgeon: Vickki Hearing, MD;  Location: [facility];  Service: Orthopedics;  Laterality: Left;   Subxiphoid pericardial window  11/10   VAGINAL HYSTERECTOMY       reports that she quit smoking about 33 years ago. Her smoking use included cigarettes. She started smoking about 43 years ago. She has a 20 pack-year smoking history. She has been exposed to tobacco smoke. She has never used smokeless tobacco. She reports that she does not drink alcohol and does not use drugs.  Allergies  Allergen Reactions   Amitriptyline Hcl Other (See Comments)    Caused "jaws to twist and lock"   Plavix [Clopidogrel Bisulfate] Hives   Sulfonamide Derivatives Other (See Comments)    UNKNOWN REACTION    Family History  Problem Relation Age of Onset   Cancer Mother        Colon    Coronary artery disease Sister    Coronary artery disease Brother    Arthritis Other    Lung disease Other    Asthma Other     Prior to Admission medications  Medication Sig Start Date End Date Taking? Authorizing Provider  acetaminophen (TYLENOL) 325 MG tablet Take 2 tablets (650 mg total) by mouth every 6 (six) hours as needed for mild pain (or Fever >/= 101). 11/25/22   , Courage, MD  albuterol (PROVENTIL) (2.5 MG/3ML) 0.083% nebulizer solution Take 3 mLs (2.5 mg total) by nebulization every 4 (four) hours as needed for wheezing or shortness of breath. 07/02/22 07/02/23  Shon Hale, MD  ALPRAZolam Prudy Feeler) 1 MG tablet Take 1 tablet (1 mg total) by mouth 2 (two) times daily as needed for anxiety. Patient not taking: Reported on 02/06/2023 11/25/22   Shon Hale, MD  amiodarone (PACERONE) 200 MG tablet TAKE 1 TABLET ONCE DAILY ON MONDAY THROUGH  SATURDAY, NONE ON SUNDAY. Patient taking differently: Take 200 mg by mouth See admin instructions. TAKE 1 TABLET ONCE DAILY ON MONDAY THROUGH SATURDAY, NONE ON SUNDAY. 08/07/22   Marinus Maw, MD  amoxicillin-clavulanate (AUGMENTIN) 875-125 MG tablet Take 1 tablet by mouth every 12 (twelve) hours. 12/21/22   Eber Hong, MD  aspirin EC 81 MG tablet Take 1 tablet (81 mg total) by mouth daily with breakfast. Swallow whole. 07/02/22   Shon Hale, MD  atorvastatin (LIPITOR) 80 MG tablet Take 1 tablet (80 mg total) by mouth daily. 02/06/23   Jonelle Sidle, MD  Cyanocobalamin (VITAMIN B-12) 5000 MCG SUBL Take 5,000 mcg by mouth daily.    [provider]  dicyclomine (BENTYL) 10 MG capsule Take 1 capsule (10 mg total) by mouth every 12 (twelve) hours as needed for spasms (abdominal pain). 12/08/22   Dolores Frame, MD  ertugliflozin L-PyroglutamicAc (STEGLATRO) 15 MG TABS tablet Take 15 mg by mouth daily.    [provider]  folic acid (FOLVITE) 1 MG tablet Take 1 tablet (1 mg total) by mouth daily. 11/12/22   Catarina Hartshorn, MD  HYDROcodone-acetaminophen (NORCO) 7.5-325 MG tablet Take 1 tablet by mouth 3 (three) times daily as needed. 12/26/22   [provider]  isosorbide mononitrate (IMDUR) 30 MG 24 hr tablet Take 1 tablet (30 mg total) by mouth daily. 07/02/22   Shon Hale, MD  JARDIANCE 25 MG TABS tablet Take 25 mg by mouth daily. Patient not taking: Reported on 02/06/2023    [provider]  LORazepam (ATIVAN) 0.5 MG tablet Take by mouth.    [provider]  metoprolol succinate (TOPROL-XL) 50 MG 24 hr tablet Take 1 tablet (50 mg total) by mouth 2 (two) times daily. Take with or immediately following a meal. 07/02/22   , Courage, MD  Multiple Vitamin (MULTIVITAMIN WITH MINERALS) TABS tablet Take 1 tablet by mouth daily.     [provider]  nitroGLYCERIN (NITROSTAT) 0.4 MG SL tablet Place 1 tablet (0.4 mg total) under the  tongue every 5 (five) minutes x 3 doses as needed for chest pain. 02/06/23   Jonelle Sidle, MD  ondansetron (ZOFRAN) 4 MG tablet Take 4 mg by mouth every 6 (six) hours.    [provider]  ondansetron (ZOFRAN) 4 MG tablet Take 1 tablet (4 mg total) by mouth daily as needed for nausea or vomiting. Patient not taking: Reported on 02/06/2023 11/25/22 11/25/23  Shon Hale, MD  OXYGEN Inhale 2 L into the lungs as needed (PRN as needed at night).    [provider]  Potassium Chloride ER 20 MEQ TBCR Take 1 tablet (20 mEq total) by mouth daily. 1 tab daily by mouth 02/06/23 11/03/23  Jonelle Sidle, MD  PROAIR HFA 108 (90 Base) MCG/ACT inhaler Inhale 1 puff into the lungs every 4 (four) hours as needed for wheezing or shortness of breath.  11/10/18   [provider]  ranolazine (RANEXA) 500 MG 12 hr tablet Take 1 tablet (500 mg total) by mouth 2 (two) times daily. 07/02/22   Shon Hale, MD  rOPINIRole (REQUIP) 0.5 MG tablet Take 0.5 mg by mouth daily. 01/20/23   [provider]  senna-docusate (SENOKOT-S) 8.6-50 MG tablet Take 2 tablets by mouth at bedtime. 11/25/22 11/25/23  Shon Hale, MD  ticagrelor (BRILINTA) 60 MG TABS tablet Take 60 mg by mouth 2 (two) times daily.    [provider]  triamcinolone cream (KENALOG) 0.1 % Apply 1 Application topically daily as needed (for rash/skin irritation). 08/25/22   [provider]  vitamin E 180 MG (400 UNITS) capsule Take 400 Units by mouth daily.    [provider]    Physical Exam: Vitals:   03/11/23 2005 03/11/23 2015 03/11/23 2030 03/11/23 2100  BP: 133/80 106/69 112/74 (!) 119/95  Pulse:  68 60 65  Resp: (!) 21 18 19 14   Temp:      TempSrc:      SpO2: 95% 94% 94% 100%  Weight:      Height:        Constitutional: Hard of hearing, calm, comfortable Vitals:   03/11/23 2005 03/11/23 2015 03/11/23 2030 03/11/23 2100  BP: 133/80 106/69 112/74 (!) 119/95  Pulse:  68 60 65   Resp: (!) 21 18 19 14   Temp:      TempSrc:      SpO2: 95% 94% 94% 100%  Weight:      Height:       Eyes: PERRL, lids and conjunctivae normal ENMT: Mucous membranes are moist.  Neck: normal, supple, no masses, no thyromegaly Respiratory: clear to auscultation bilaterally, no wheezing, no crackles. Normal respiratory effort. No accessory muscle use.  Cardiovascular: Regular rate and rhythm, no murmurs / rubs / gallops. No extremity edema.  Abdomen: no tenderness, no masses palpated. No hepatosplenomegaly. Bowel sounds positive.  Musculoskeletal: no clubbing / cyanosis. No joint deformity upper and lower extremities.  Skin: Purpura to hands of upper extremities, no ulcers. No induration Neurologic: No facial symmetry, 4+/5 strength in all extremities.Marland Kitchen  Psychiatric: Hard of hearing,  Alert and oriented x 3. Normal mood.   Labs on Admission: I have personally reviewed following labs and imaging studies  CBC: Recent Labs  Lab 03/11/23 1625  WBC 8.4  HGB 12.1  HCT 38.3  MCV 95.8  PLT 256   Basic Metabolic Panel: Recent Labs  Lab 03/11/23 1625  NA 139  K 4.7  CL 101  CO2 28  GLUCOSE 115*  BUN 11  CREATININE 0.72  CALCIUM 9.6   GFR: Estimated Creatinine Clearance: 42.4 mL/min (by C-G formula based on SCr of 0.72 mg/dL). Liver Function Tests: Recent Labs  Lab 03/11/23 1625  AST 28  ALT 16  ALKPHOS 71  BILITOT 1.2  PROT 7.5  ALBUMIN 3.4*   Recent Labs  Lab 03/11/23 1625  LIPASE 30   Urine analysis:    Component Value Date/Time   COLORURINE STRAW (A) 03/11/2023 1829   APPEARANCEUR CLEAR 03/11/2023 1829   LABSPEC 1.014 03/11/2023 1829   PHURINE 7.0 03/11/2023 1829   GLUCOSEU >=500 (A) 03/11/2023 1829   HGBUR SMALL (A) 03/11/2023 1829   BILIRUBINUR NEGATIVE 03/11/2023 1829   KETONESUR NEGATIVE 03/11/2023 1829   PROTEINUR NEGATIVE 03/11/2023  1829   UROBILINOGEN 0.2 12/07/2009 1532   NITRITE NEGATIVE 03/11/2023 1829   LEUKOCYTESUR NEGATIVE 03/11/2023 1829     Radiological Exams on Admission: CT ABDOMEN PELVIS W CONTRAST  Result Date: 03/11/2023 CLINICAL DATA:  Acute generalized abdominal pain. EXAM: CT ABDOMEN AND PELVIS WITH CONTRAST TECHNIQUE: Multidetector CT imaging of the abdomen and pelvis was performed using the standard protocol following bolus administration of intravenous contrast. RADIATION DOSE REDUCTION: This exam was performed according to the departmental dose-optimization program which includes automated exposure control, adjustment of the mA and/or kV according to patient size and/or use of iterative reconstruction technique. CONTRAST:  OMNIPAQUE IOHEXOL 300 MG/ML  SOLN COMPARISON:  November 24, 2022. FINDINGS: Lower chest: Small bilateral pleural effusions are noted with minimal adjacent subsegmental atelectasis. Hepatobiliary: No focal liver abnormality is seen. Status post cholecystectomy. No biliary dilatation. Pancreas: Stable pancreatic ductal dilatation. No acute inflammation is noted. Spleen: Calcified granulomas are noted in the spleen. Adrenals/Urinary Tract: Adrenal glands are unremarkable. Kidneys are normal, without renal calculi, focal lesion, or hydronephrosis. Bladder is unremarkable. Stomach/Bowel: Stomach is unremarkable. There is no evidence of bowel obstruction or inflammation. Vascular/Lymphatic: Aortic atherosclerosis. No enlarged abdominal or pelvic lymph nodes. Reproductive: Status post hysterectomy. No adnexal masses. Other: No abdominal wall hernia or abnormality. No abdominopelvic ascites. Musculoskeletal: No acute or significant osseous findings. IMPRESSION: Small bilateral pleural effusions with minimal adjacent subsegmental atelectasis. No definite acute abnormality seen in the abdomen or pelvis. Aortic Atherosclerosis (ICD10-I70.0). Electronically Signed   By: Lupita Raider M.D.   On: 03/11/2023 17:52   DG Chest 2 View  Result Date: 03/11/2023 CLINICAL DATA:  Shortness of breath EXAM: CHEST - 2 VIEW  COMPARISON:  X-ray 01/19/2023 and older FINDINGS: Enlarged heart with vascular congestion and interstitial changes. Small effusions. Calcified tortuous aorta. Left upper chest pacemaker. No pneumothorax. Osteopenia with degenerative changes. Surgical Burrs along the right humeral head. IMPRESSION: Enlarged heart with calcified tortuous aorta.  Pacemaker. Developing small effusions with the adjacent opacity and interstitial changes. Recommend follow-up Electronically Signed   By: Karen Kays M.D.   On: 03/11/2023 17:44    EKG: Independently reviewed.  Sinus rhythm, rate 70, QTc prolonged at 522.  No significant change from prior.  Assessment/Plan Principal Problem:   NSTEMI (non-ST elevated myocardial infarction) (HCC) Active Problems:   COPD (chronic obstructive pulmonary disease) (HCC)   Chronic respiratory failure with hypoxia (HCC)   Chronic systolic heart failure (HCC)   Atrial fibrillation, chronic (HCC)   PPM-Medtronic   Essential hypertension   CAD (coronary artery disease)  Assessment and Plan: * NSTEMI (non-ST elevated myocardial infarction) (HCC) Presents with chest pain.  Troponin 199 > 252.  EKG unchanged.  History of 3V CAD s/p complex PCI to RCA in 05/2021  - EDP talked to Dr. Gwendlyn Deutscher history, increasing troponin, transfer to [facility] for cardiology consultation, give aspirin load, ticagrelor, initiate heparin drip for presumed NSTEMI - Trend troponin - NPO midnight  Chronic systolic heart failure (HCC) BNP elevated at 1151 from baseline.  No peripheral sign of volume overload, per chart weight today has improved compared to past year.  Chest imaging showing small bilateral pleural effusions.  Last echo 06/2022, EF of 55 to 60%. -IV Lasix 20 mg Po x 1, will hold off on further Lasix dosing  Chronic respiratory failure with hypoxia (HCC) On chronic O2- 2L.  Sats Initially 88% on room air, placed on 2 L.  COPD (chronic obstructive pulmonary disease) (HCC) Initial  wheezing on  presentation to ED, has resolved. -Nebs.  And scheduled.  Atrial fibrillation, chronic (HCC) Pacemaker status.  Currently in sinus rhythm.  Follows with cardiology.  Not on anticoagulation due to frequent falls and confusion. -Continue amiodarone and metoprolol  Essential hypertension Stable. -Resume metoprolol,   DVT prophylaxis: Heparin Code Status: DNR-ACP documents reviewed. Family Communication: None at bedside Disposition Plan: ~ 2 days Consults called: Cardiology Admission status: Obs tele   Author: Onnie Boer, MD 03/11/2023 11:01 PM  For on call review [email or web address].

## 2023-03-11 NOTE — Assessment & Plan Note (Signed)
On chronic O2- 2L.  Sats Initially 88% on room air, placed on 2 L.

## 2023-03-11 NOTE — Assessment & Plan Note (Signed)
Pacemaker status.  Currently in sinus rhythm.  Follows with cardiology.  Not on anticoagulation due to frequent falls and confusion. -Continue amiodarone and metoprolol

## 2023-03-12 ENCOUNTER — Observation Stay (HOSPITAL_COMMUNITY): Payer: 59

## 2023-03-12 DIAGNOSIS — Z87891 Personal history of nicotine dependence: Secondary | ICD-10-CM | POA: Diagnosis not present

## 2023-03-12 DIAGNOSIS — R531 Weakness: Secondary | ICD-10-CM | POA: Diagnosis not present

## 2023-03-12 DIAGNOSIS — R0602 Shortness of breath: Secondary | ICD-10-CM | POA: Diagnosis present

## 2023-03-12 DIAGNOSIS — J9611 Chronic respiratory failure with hypoxia: Secondary | ICD-10-CM | POA: Diagnosis not present

## 2023-03-12 DIAGNOSIS — I509 Heart failure, unspecified: Secondary | ICD-10-CM

## 2023-03-12 DIAGNOSIS — Z950 Presence of cardiac pacemaker: Secondary | ICD-10-CM | POA: Diagnosis not present

## 2023-03-12 DIAGNOSIS — K862 Cyst of pancreas: Secondary | ICD-10-CM | POA: Diagnosis not present

## 2023-03-12 DIAGNOSIS — Z741 Need for assistance with personal care: Secondary | ICD-10-CM | POA: Diagnosis not present

## 2023-03-12 DIAGNOSIS — Z743 Need for continuous supervision: Secondary | ICD-10-CM | POA: Diagnosis not present

## 2023-03-12 DIAGNOSIS — Z79899 Other long term (current) drug therapy: Secondary | ICD-10-CM | POA: Diagnosis not present

## 2023-03-12 DIAGNOSIS — E785 Hyperlipidemia, unspecified: Secondary | ICD-10-CM | POA: Diagnosis not present

## 2023-03-12 DIAGNOSIS — Z955 Presence of coronary angioplasty implant and graft: Secondary | ICD-10-CM | POA: Diagnosis not present

## 2023-03-12 DIAGNOSIS — I21A1 Myocardial infarction type 2: Secondary | ICD-10-CM | POA: Diagnosis not present

## 2023-03-12 DIAGNOSIS — I110 Hypertensive heart disease with heart failure: Secondary | ICD-10-CM | POA: Diagnosis not present

## 2023-03-12 DIAGNOSIS — I5023 Acute on chronic systolic (congestive) heart failure: Secondary | ICD-10-CM | POA: Diagnosis not present

## 2023-03-12 DIAGNOSIS — I214 Non-ST elevation (NSTEMI) myocardial infarction: Secondary | ICD-10-CM | POA: Diagnosis not present

## 2023-03-12 DIAGNOSIS — I480 Paroxysmal atrial fibrillation: Secondary | ICD-10-CM | POA: Diagnosis not present

## 2023-03-12 DIAGNOSIS — F0390 Unspecified dementia without behavioral disturbance: Secondary | ICD-10-CM | POA: Diagnosis present

## 2023-03-12 DIAGNOSIS — J449 Chronic obstructive pulmonary disease, unspecified: Secondary | ICD-10-CM | POA: Diagnosis not present

## 2023-03-12 DIAGNOSIS — I5031 Acute diastolic (congestive) heart failure: Secondary | ICD-10-CM

## 2023-03-12 DIAGNOSIS — I482 Chronic atrial fibrillation, unspecified: Secondary | ICD-10-CM | POA: Diagnosis not present

## 2023-03-12 DIAGNOSIS — Z7189 Other specified counseling: Secondary | ICD-10-CM | POA: Diagnosis not present

## 2023-03-12 DIAGNOSIS — Z681 Body mass index (BMI) 19 or less, adult: Secondary | ICD-10-CM | POA: Diagnosis not present

## 2023-03-12 DIAGNOSIS — R278 Other lack of coordination: Secondary | ICD-10-CM | POA: Diagnosis not present

## 2023-03-12 DIAGNOSIS — M6281 Muscle weakness (generalized): Secondary | ICD-10-CM | POA: Diagnosis not present

## 2023-03-12 DIAGNOSIS — D649 Anemia, unspecified: Secondary | ICD-10-CM | POA: Diagnosis not present

## 2023-03-12 DIAGNOSIS — Z515 Encounter for palliative care: Secondary | ICD-10-CM | POA: Diagnosis not present

## 2023-03-12 DIAGNOSIS — R627 Adult failure to thrive: Secondary | ICD-10-CM | POA: Diagnosis not present

## 2023-03-12 DIAGNOSIS — I5022 Chronic systolic (congestive) heart failure: Secondary | ICD-10-CM | POA: Diagnosis not present

## 2023-03-12 DIAGNOSIS — E876 Hypokalemia: Secondary | ICD-10-CM | POA: Diagnosis not present

## 2023-03-12 DIAGNOSIS — Z8249 Family history of ischemic heart disease and other diseases of the circulatory system: Secondary | ICD-10-CM | POA: Diagnosis not present

## 2023-03-12 DIAGNOSIS — Z66 Do not resuscitate: Secondary | ICD-10-CM | POA: Diagnosis not present

## 2023-03-12 DIAGNOSIS — I252 Old myocardial infarction: Secondary | ICD-10-CM | POA: Diagnosis not present

## 2023-03-12 DIAGNOSIS — I2510 Atherosclerotic heart disease of native coronary artery without angina pectoris: Secondary | ICD-10-CM | POA: Diagnosis not present

## 2023-03-12 DIAGNOSIS — R296 Repeated falls: Secondary | ICD-10-CM | POA: Diagnosis not present

## 2023-03-12 DIAGNOSIS — R079 Chest pain, unspecified: Secondary | ICD-10-CM | POA: Diagnosis not present

## 2023-03-12 LAB — ECHOCARDIOGRAM COMPLETE
AR max vel: 1.79 cm2
AV Area VTI: 1.94 cm2
AV Area mean vel: 2.13 cm2
AV Mean grad: 3 mmHg
AV Peak grad: 7.5 mmHg
Ao pk vel: 1.37 m/s
Area-P 1/2: 2.83 cm2
Height: 65 in
P 1/2 time: 689 msec
S' Lateral: 3.9 cm
Weight: 1894.19 oz

## 2023-03-12 LAB — BASIC METABOLIC PANEL
Anion gap: 11 (ref 5–15)
BUN: 13 mg/dL (ref 8–23)
CO2: 26 mmol/L (ref 22–32)
Calcium: 8.9 mg/dL (ref 8.9–10.3)
Chloride: 102 mmol/L (ref 98–111)
Creatinine, Ser: 1.04 mg/dL — ABNORMAL HIGH (ref 0.44–1.00)
GFR, Estimated: 53 mL/min — ABNORMAL LOW (ref >60)
Glucose, Bld: 79 mg/dL (ref 70–99)
Potassium: 3.6 mmol/L (ref 3.5–5.1)
Sodium: 139 mmol/L (ref 135–145)

## 2023-03-12 LAB — CBC
HCT: 34.4 % — ABNORMAL LOW (ref 36.0–46.0)
Hemoglobin: 11 g/dL — ABNORMAL LOW (ref 12.0–15.0)
MCH: 29.6 pg (ref 26.0–34.0)
MCHC: 32 g/dL (ref 30.0–36.0)
MCV: 92.7 fL (ref 80.0–100.0)
Platelets: 228 10*3/uL (ref 150–400)
RBC: 3.71 MIL/uL — ABNORMAL LOW (ref 3.87–5.11)
RDW: 18.7 % — ABNORMAL HIGH (ref 11.5–15.5)
WBC: 8.9 10*3/uL (ref 4.0–10.5)
nRBC: 0 % (ref 0.0–0.2)

## 2023-03-12 LAB — GLUCOSE, CAPILLARY
Glucose-Capillary: 84 mg/dL (ref 70–99)
Glucose-Capillary: 92 mg/dL (ref 70–99)
Glucose-Capillary: 81 mg/dL (ref 70–99)
Glucose-Capillary: 133 mg/dL — ABNORMAL HIGH (ref 70–99)

## 2023-03-12 LAB — HEPARIN LEVEL (UNFRACTIONATED)
Heparin Unfractionated: 0.11 IU/mL — ABNORMAL LOW (ref 0.30–0.70)
Heparin Unfractionated: 0.19 IU/mL — ABNORMAL LOW (ref 0.30–0.70)

## 2023-03-12 LAB — TROPONIN I (HIGH SENSITIVITY): Troponin I (High Sensitivity): 168 ng/L (ref <18)

## 2023-03-12 MED ORDER — POLYETHYLENE GLYCOL 3350 17 G PO PACK
17.0000 g | PACK | Freq: Every day | ORAL | Status: DC | PRN
  Administered 2023-03-13 – 2023-03-17 (×2): 17 g via ORAL
  Filled 2023-03-12 (×2): qty 1

## 2023-03-12 MED ORDER — ALPRAZOLAM 0.5 MG PO TABS
0.5000 mg | ORAL_TABLET | Freq: Three times a day (TID) | ORAL | Status: DC | PRN
  Administered 2023-03-13 – 2023-03-18 (×11): 0.5 mg via ORAL
  Filled 2023-03-12 (×11): qty 1

## 2023-03-12 MED ORDER — ASPIRIN 81 MG PO TBEC
81.0000 mg | DELAYED_RELEASE_TABLET | Freq: Every day | ORAL | Status: DC
  Administered 2023-03-12 – 2023-03-18 (×7): 81 mg via ORAL
  Filled 2023-03-12 (×7): qty 1

## 2023-03-12 MED ORDER — ATORVASTATIN CALCIUM 80 MG PO TABS
80.0000 mg | ORAL_TABLET | Freq: Every day | ORAL | Status: DC
  Administered 2023-03-12 – 2023-03-18 (×7): 80 mg via ORAL
  Filled 2023-03-12 (×7): qty 1

## 2023-03-12 MED ORDER — FUROSEMIDE 10 MG/ML IJ SOLN
40.0000 mg | Freq: Once | INTRAMUSCULAR | Status: AC
  Administered 2023-03-12: 40 mg via INTRAVENOUS
  Filled 2023-03-12: qty 4

## 2023-03-12 MED ORDER — ALPRAZOLAM 0.5 MG PO TABS
0.5000 mg | ORAL_TABLET | Freq: Once | ORAL | Status: AC
  Administered 2023-03-13: 0.5 mg via ORAL
  Filled 2023-03-12: qty 1

## 2023-03-12 MED ORDER — POTASSIUM CHLORIDE CRYS ER 20 MEQ PO TBCR
40.0000 meq | EXTENDED_RELEASE_TABLET | Freq: Once | ORAL | Status: AC
  Administered 2023-03-12: 40 meq via ORAL
  Filled 2023-03-12: qty 2

## 2023-03-12 MED ORDER — ISOSORBIDE MONONITRATE ER 30 MG PO TB24
30.0000 mg | ORAL_TABLET | Freq: Every day | ORAL | Status: DC
  Administered 2023-03-12 – 2023-03-18 (×7): 30 mg via ORAL
  Filled 2023-03-12 (×7): qty 1

## 2023-03-12 MED ORDER — ACETAMINOPHEN 325 MG PO TABS
650.0000 mg | ORAL_TABLET | Freq: Four times a day (QID) | ORAL | Status: DC | PRN
  Administered 2023-03-12 – 2023-03-17 (×13): 650 mg via ORAL
  Filled 2023-03-12 (×15): qty 2

## 2023-03-12 MED ORDER — INSULIN ASPART 100 UNIT/ML IJ SOLN: 0.0000 [IU] | Freq: Every day | INTRAMUSCULAR | Status: DC

## 2023-03-12 MED ORDER — TICAGRELOR 60 MG PO TABS
60.0000 mg | ORAL_TABLET | Freq: Two times a day (BID) | ORAL | Status: DC
  Administered 2023-03-12 – 2023-03-18 (×13): 60 mg via ORAL
  Filled 2023-03-12 (×14): qty 1

## 2023-03-12 MED ORDER — AMIODARONE HCL 200 MG PO TABS
200.0000 mg | ORAL_TABLET | ORAL | Status: DC
  Administered 2023-03-12: 200 mg via ORAL
  Filled 2023-03-12: qty 1

## 2023-03-12 MED ORDER — INSULIN ASPART 100 UNIT/ML IJ SOLN
0.0000 [IU] | Freq: Three times a day (TID) | INTRAMUSCULAR | Status: DC
  Administered 2023-03-18: 1 [IU] via SUBCUTANEOUS

## 2023-03-12 MED ORDER — ACETAMINOPHEN 650 MG RE SUPP: 650.0000 mg | Freq: Four times a day (QID) | RECTAL | Status: DC | PRN

## 2023-03-12 MED ORDER — METOPROLOL SUCCINATE ER 50 MG PO TB24
50.0000 mg | ORAL_TABLET | Freq: Two times a day (BID) | ORAL | Status: DC
  Administered 2023-03-12 – 2023-03-18 (×14): 50 mg via ORAL
  Filled 2023-03-12 (×14): qty 1

## 2023-03-12 NOTE — Progress Notes (Signed)
ANTICOAGULATION CONSULT NOTE   Pharmacy Consult for IV heparin  Indication: chest pain/ACS  Allergies  Allergen Reactions   Amitriptyline Hcl Other (See Comments)    Caused "jaws to twist and lock"   Plavix [Clopidogrel Bisulfate] Hives   Sulfonamide Derivatives Other (See Comments)    UNKNOWN REACTION    Patient Measurements: Height: 5\' 5"  (165.1 cm) Weight: 53.7 kg (118 lb 6.2 oz) IBW/kg (Calculated) : 57 Heparin Dosing Weight: 52.3 kg  Vital Signs: Temp: 98 F (36.7 C) (08/08 1145) Temp Source: Oral (08/08 1145) BP: 128/71 (08/08 1145) Pulse Rate: 61 (08/08 1145)  Labs: Recent Labs    03/11/23 1625 03/11/23 1748 03/11/23 2340 03/12/23 0537 03/12/23 0554 03/12/23 0731 03/12/23 1537  HGB 12.1  --   --   --   --  11.0*  --   HCT 38.3  --   --   --   --  34.4*  --   PLT 256  --   --   --   --  228  --   HEPARINUNFRC  --   --   --  0.11*  --   --  0.19*  CREATININE 0.72  --   --  1.04*  --   --   --   TROPONINIHS 199* 252* 261*  --  168*  --   --     Estimated Creatinine Clearance: 33.5 mL/min (A) (by C-G formula based on SCr of 1.04 mg/dL (H)).   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  Received bolus of 3100 units at 2055 pm.  On Brilinta 60 mg bid PTA, but no anticoagulants noted.  Heparin level 0.19 (sub-therapeutic) on heparin 800 units/hr.  Hgb 11, Plts down 168k. No issues with infusion or bleeding reported.  Goal of Therapy:  Heparin level 0.3-0.7 units/ml Monitor platelets by anticoagulation protocol: Yes   Plan:  Increase heparin to 900 units/hr Recheck heparin level in 8hrs Daily heparin level and CBC.   Loralee Pacas, PharmD, BCPS 03/12/2023 4:07 PM  Please check AMION for all [facility] phone numbers After 10:00 PM, call Main Pharmacy [phone]

## 2023-03-12 NOTE — Progress Notes (Signed)
ANTICOAGULATION CONSULT NOTE   Pharmacy Consult for IV heparin  Indication: chest pain/ACS  Allergies  Allergen Reactions   Amitriptyline Hcl Other (See Comments)    Caused "jaws to twist and lock"   Plavix [Clopidogrel Bisulfate] Hives   Sulfonamide Derivatives Other (See Comments)    UNKNOWN REACTION    Patient Measurements: Height: 5\' 5"  (165.1 cm) Weight: 53.7 kg (118 lb 6.2 oz) IBW/kg (Calculated) : 57 Heparin Dosing Weight: 52.3 kg  Vital Signs: Temp: 97.6 F (36.4 C) (08/08 0409) Temp Source: Oral (08/08 0409) BP: 138/69 (08/08 0409) Pulse Rate: 61 (08/08 0409)  Labs: Recent Labs    03/11/23 1625 03/11/23 1748 03/11/23 2340 03/12/23 0537  HGB 12.1  --   --   --   HCT 38.3  --   --   --   PLT 256  --   --   --   HEPARINUNFRC  --   --   --  0.11*  CREATININE 0.72  --   --  1.04*  TROPONINIHS 199* 252* 261*  --     Estimated Creatinine Clearance: 33.5 mL/min (A) (by C-G formula based on SCr of 1.04 mg/dL (H)).   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  Received bolus of 3100 units at 2055 pm.  On Brilinta 60 mg bid PTA, but no anticoagulants  noted.  8/8 AM update:  Heparin level sub-therapeutic   Goal of Therapy:  Heparin level 0.3-0.7 units/ml Monitor platelets by anticoagulation protocol: Yes   Plan:  Inc heparin to 800 units/hr 1500 heparin level  Abran Duke, PharmD, BCPS Clinical Pharmacist Phone: [phone]

## 2023-03-12 NOTE — Consult Note (Addendum)
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 History: 0 Diabetes  History: 0 Stroke History: 0 Vascular Disease History: 1 Age Score: 2 Gender Score: 1    For questions or updates, please contact [facility] Please consult [email or web address] for contact info under    Signed, Abagail Kitchens, PA-C  03/12/2023 12:04 PM   Personally seen and examined. Agree with APP above with the following comments:  Briefly 87 yo F with a history of CAD s/p PCI last in 2022 (non-surgical candidate) PAF on no AC per patient on low dose amiodarone, COPD on 2 L O2, HTN and HLD.  She has been diagnosed with dementia.  Patient notes that she was doing well at her recent APP visit.  She notes that she has not had residual chest pain since before she has percutaneous intervention with Dr. Herbie Baltimore.  She is able to recall both her cath experience with Drs. Sheralyn Boatman, Dr. Diona Browner' s care plan (echo planned for MR and aorta follow up) and her recent PCP visit with Dr. Margo Aye.     She notes both chest pain and shortness of breath, both of which has improved.  She noted improving DOE with diuretics.  She notes HA, but attributes this to her not eating in two days.  Did not necessarily feel this yesterday s/p PRN nitroglycerin.  She notes to me that she feels her daughter does not care about her and just wants her for her money.  She notes that had been undergoing paperwork to change her POA to remove her daughter from her care team.  Exam notable for systolic murmur, regular rhythm, crackles in the bases thin female.  At time of Assessment AOX3  EKG: SR 1st heart block, PR prolongation  Would recommend  - will get echo today and IV lasix dose in PM - holding Ranexa and amiodarone, EKG tomorrow - if creatinine stable and persistent sx despite medical therapy, if her mentation is stable I would offer LHC (diagnostic); discussed with [facility] teammates- I do not have any evidence presently of dementia - continue DAPT and heparin (planned for 48 hours) - can eat today, NPO at midnight.  Riley Lam, MD F[facility] Cardiologist [facility]  [address] [place], [address] [place], [place] [address] [phone]  1:52 PM

## 2023-03-12 NOTE — Plan of Care (Signed)
  Problem: Education: Goal: Ability to describe self-care measures that may prevent or decrease complications (Diabetes Survival Skills Education) will improve Outcome: Progressing   Problem: Coping: Goal: Ability to adjust to condition or change in health will improve Outcome: Progressing   Problem: Education: Goal: Knowledge of General Education information will improve Description: Including pain rating scale, medication(s)/side effects and non-pharmacologic comfort measures Outcome: Progressing   

## 2023-03-12 NOTE — Progress Notes (Signed)
PROGRESS NOTE    JANUS RAAD  NWG:[id number] DOB: 02-Jan-1936 DOA: 03/11/2023 PCP: [name], MD  87/F with history of paroxysmal A-fib, PPM, CAD, chronic systolic and diastolic CHF, COPD, chronic hypoxic respiratory failure presented to the ED from her SNF with acute onset chest pain dyspnea and diaphoresis. -In the ED sats were 88% on 2 L, mildly tachypneic, BNP 1151, troponin 190, 260 -Chest x-ray with small pleural effusions and interstitial findings, CT chest noted small bilateral pleural effusions -Case was discussed with cardiology and she was transferred to [facility] for cards eval   Subjective: -Feels a little better, still with some dyspnea  Assessment and Plan:  NSTEMI versus demand ischemia -Presented with acute onset chest pain and dyspnea, troponin peaked at 260, EKG unchanged -History of multivessel CAD and complex PCI to RCA in 10/22 -Continue aspirin, metoprolol, statin, also started on Brilinta last night per cards recommendation along with heparin -Continue Imdur -Check echocardiogram -Further workup per cardiology  Acute on chronic diastolic CHF -Last echo 11/23 with EF of 55%, mildly reduced RV -Slightly volume overloaded, with bilateral pleural effusions -Repeat IV Lasix 40 Mg x 1 today -Follow-up echocardiogram  Chronic respiratory failure with hypoxia (HCC) On chronic O2- 2L.  Stable, monitor  COPD (chronic obstructive pulmonary disease) (HCC) -Nebs.  And scheduled.  Atrial fibrillation, chronic (HCC) Pacemaker status.   -Currently in sinus rhythm, followed by cards, not on anticoagulation due to frequent falls and confusion. -Continue amiodarone and metoprolol  Essential hypertension Stable. -Resume metoprolol,  DVT prophylaxis: Heparin Code Status: DNR Family Communication: No family at bedside Disposition Plan: Back to SNF pending cardiac workup  Consultants:    Procedures:   Antimicrobials:    Objective: Vitals:   03/12/23 0000  03/12/23 0126 03/12/23 0409 03/12/23 0810  BP: 129/63 128/65 138/69 120/69  Pulse: 67 62 61 61  Resp: 18 (!) 23 20 19   Temp:  97.6 F (36.4 C) 97.6 F (36.4 C) 97.8 F (36.6 C)  TempSrc:  Oral Oral Oral  SpO2: 93% 97% 96% 95%  Weight:  53.7 kg    Height:  5\' 5"  (1.651 m)      Intake/Output Summary (Last 24 hours) at 03/12/2023 0946 Last data filed at 03/12/2023 0400 Gross per 24 hour  Intake 42.43 ml  Output 213 ml  Net -170.57 ml   Filed Weights   03/11/23 1820 03/12/23 0126  Weight: 52.3 kg 53.7 kg    Examination:  General exam: Appears calm and comfortable  Respiratory system: Clear to auscultation Cardiovascular system: S1 & S2 heard, RRR.  Abd: nondistended, soft and nontender.Normal bowel sounds heard. Central nervous system: Alert and oriented. No focal neurological deficits. Extremities: no edema Skin: No rashes Psychiatry:  Mood & affect appropriate.     Data Reviewed:   CBC: Recent Labs  Lab 03/11/23 1625 03/12/23 0731  WBC 8.4 8.9  HGB 12.1 11.0*  HCT 38.3 34.4*  MCV 95.8 92.7  PLT 256 228   Basic Metabolic Panel: Recent Labs  Lab 03/11/23 1625 03/12/23 0537  NA 139 139  K 4.7 3.6  CL 101 102  CO2 28 26  GLUCOSE 115* 79  BUN 11 13  CREATININE 0.72 1.04*  CALCIUM 9.6 8.9   GFR: Estimated Creatinine Clearance: 33.5 mL/min (A) (by C-G formula based on SCr of 1.04 mg/dL (H)). Liver Function Tests: Recent Labs  Lab 03/11/23 1625  AST 28  ALT 16  ALKPHOS 71  BILITOT 1.2  PROT 7.5  ALBUMIN  3.4*   Recent Labs  Lab 03/11/23 1625  LIPASE 30   No results for input(s): "AMMONIA" in the last 168 hours. Coagulation Profile: No results for input(s): "INR", "PROTIME" in the last 168 hours. Cardiac Enzymes: No results for input(s): "CKTOTAL", "CKMB", "CKMBINDEX", "TROPONINI" in the last 168 hours. BNP (last 3 results) No results for input(s): "PROBNP" in the last 8760 hours. HbA1C: No results for input(s): "HGBA1C" in the last 72  hours. CBG: Recent Labs  Lab 03/12/23 0555  GLUCAP 84   Lipid Profile: No results for input(s): "CHOL", "HDL", "LDLCALC", "TRIG", "CHOLHDL", "LDLDIRECT" in the last 72 hours. Thyroid Function Tests: No results for input(s): "TSH", "T4TOTAL", "FREET4", "T3FREE", "THYROIDAB" in the last 72 hours. Anemia Panel: No results for input(s): "VITAMINB12", "FOLATE", "FERRITIN", "TIBC", "IRON", "RETICCTPCT" in the last 72 hours. Urine analysis:    Component Value Date/Time   COLORURINE STRAW (A) 03/11/2023 1829   APPEARANCEUR CLEAR 03/11/2023 1829   LABSPEC 1.014 03/11/2023 1829   PHURINE 7.0 03/11/2023 1829   GLUCOSEU >=500 (A) 03/11/2023 1829   HGBUR SMALL (A) 03/11/2023 1829   BILIRUBINUR NEGATIVE 03/11/2023 1829   KETONESUR NEGATIVE 03/11/2023 1829   PROTEINUR NEGATIVE 03/11/2023 1829   UROBILINOGEN 0.2 12/07/2009 1532   NITRITE NEGATIVE 03/11/2023 1829   LEUKOCYTESUR NEGATIVE 03/11/2023 1829   Sepsis Labs: @LABRCNTIP (procalcitonin:4,lacticidven:4)  )No results found for this or any previous visit (from the past 240 hour(s)).   Radiology Studies: CT ABDOMEN PELVIS W CONTRAST  Result Date: 03/11/2023 CLINICAL DATA:  Acute generalized abdominal pain. EXAM: CT ABDOMEN AND PELVIS WITH CONTRAST TECHNIQUE: Multidetector CT imaging of the abdomen and pelvis was performed using the standard protocol following bolus administration of intravenous contrast. RADIATION DOSE REDUCTION: This exam was performed according to the departmental dose-optimization program which includes automated exposure control, adjustment of the mA and/or kV according to patient size and/or use of iterative reconstruction technique. CONTRAST:  OMNIPAQUE IOHEXOL 300 MG/ML  SOLN COMPARISON:  November 24, 2022. FINDINGS: Lower chest: Small bilateral pleural effusions are noted with minimal adjacent subsegmental atelectasis. Hepatobiliary: No focal liver abnormality is seen. Status post cholecystectomy. No biliary  dilatation. Pancreas: Stable pancreatic ductal dilatation. No acute inflammation is noted. Spleen: Calcified granulomas are noted in the spleen. Adrenals/Urinary Tract: Adrenal glands are unremarkable. Kidneys are normal, without renal calculi, focal lesion, or hydronephrosis. Bladder is unremarkable. Stomach/Bowel: Stomach is unremarkable. There is no evidence of bowel obstruction or inflammation. Vascular/Lymphatic: Aortic atherosclerosis. No enlarged abdominal or pelvic lymph nodes. Reproductive: Status post hysterectomy. No adnexal masses. Other: No abdominal wall hernia or abnormality. No abdominopelvic ascites. Musculoskeletal: No acute or significant osseous findings. IMPRESSION: Small bilateral pleural effusions with minimal adjacent subsegmental atelectasis. No definite acute abnormality seen in the abdomen or pelvis. Aortic Atherosclerosis (ICD10-I70.0). Electronically Signed   By: Lupita Raider M.D.   On: 03/11/2023 17:52   DG Chest 2 View  Result Date: 03/11/2023 CLINICAL DATA:  Shortness of breath EXAM: CHEST - 2 VIEW COMPARISON:  X-ray 01/19/2023 and older FINDINGS: Enlarged heart with vascular congestion and interstitial changes. Small effusions. Calcified tortuous aorta. Left upper chest pacemaker. No pneumothorax. Osteopenia with degenerative changes. Surgical Burrs along the right humeral head. IMPRESSION: Enlarged heart with calcified tortuous aorta.  Pacemaker. Developing small effusions with the adjacent opacity and interstitial changes. Recommend follow-up Electronically Signed   By: Karen Kays M.D.   On: 03/11/2023 17:44     Scheduled Meds:  amiodarone  200 mg Oral Once per day on Monday  Tuesday Wednesday Thursday Friday Saturday   aspirin EC  81 mg Oral Daily   atorvastatin  80 mg Oral Daily   furosemide  40 mg Intravenous Once   insulin aspart  0-5 Units Subcutaneous QHS   insulin aspart  0-9 Units Subcutaneous TID WC   isosorbide mononitrate  30 mg Oral Daily   metoprolol  succinate  50 mg Oral BID   ticagrelor  60 mg Oral BID   Continuous Infusions:  heparin 800 Units/hr (03/12/23 0727)     LOS: 0 days    Time spent:    Zannie Cove, MD [facility]   03/12/2023, 9:46 AM

## 2023-03-12 NOTE — Progress Notes (Signed)
TRH night cross cover note:   I was notified by RN of the patient's request for resumption of her outpatient xanax, which she takes 0.5 mg PO BID on a scheduled basis. Additionally, as an outpatient, she has the option to take an additional as needed dose of 1.0 mg of xanax qdaily prn in between the aforementioned scheduled doses.   With her age, I held off on scheduling her xanax for now, but I did order xanax 0.5 mg po tid prn and ordered a one-time scheduled dose of her evening xanax tonight.     Newton Pigg, DO Hospitalist

## 2023-03-12 NOTE — Progress Notes (Signed)
Pt. With critical Troponin of 168. On call for [facility] paged to make aware.

## 2023-03-12 NOTE — Plan of Care (Signed)
Alert, oriented to self and place. Heparin gtt remains infusing. EKG completed.   Problem: Coping: Goal: Ability to adjust to condition or change in health will improve Outcome: Progressing   Problem: Fluid Volume: Goal: Ability to maintain a balanced intake and output will improve Outcome: Progressing   Problem: Health Behavior/Discharge Planning: Goal: Ability to identify and utilize available resources and services will improve Outcome: Progressing Goal: Ability to manage health-related needs will improve Outcome: Progressing   Problem: Metabolic: Goal: Ability to maintain appropriate glucose levels will improve Outcome: Progressing

## 2023-03-13 ENCOUNTER — Ambulatory Visit: Payer: Self-pay | Admitting: *Deleted

## 2023-03-13 DIAGNOSIS — I214 Non-ST elevation (NSTEMI) myocardial infarction: Secondary | ICD-10-CM | POA: Diagnosis not present

## 2023-03-13 LAB — BASIC METABOLIC PANEL
Anion gap: 14 (ref 5–15)
Anion gap: 9 (ref 5–15)
BUN: 18 mg/dL (ref 8–23)
BUN: 16 mg/dL (ref 8–23)
CO2: 27 mmol/L (ref 22–32)
CO2: 28 mmol/L (ref 22–32)
Calcium: 8.6 mg/dL — ABNORMAL LOW (ref 8.9–10.3)
Calcium: 9.1 mg/dL (ref 8.9–10.3)
Chloride: 96 mmol/L — ABNORMAL LOW (ref 98–111)
Chloride: 99 mmol/L (ref 98–111)
Creatinine, Ser: 1.04 mg/dL — ABNORMAL HIGH (ref 0.44–1.00)
Creatinine, Ser: 1.02 mg/dL — ABNORMAL HIGH (ref 0.44–1.00)
GFR, Estimated: 53 mL/min — ABNORMAL LOW (ref >60)
GFR, Estimated: 54 mL/min — ABNORMAL LOW (ref >60)
Glucose, Bld: 94 mg/dL (ref 70–99)
Glucose, Bld: 129 mg/dL — ABNORMAL HIGH (ref 70–99)
Potassium: 2.7 mmol/L — CL (ref 3.5–5.1)
Potassium: 4.7 mmol/L (ref 3.5–5.1)
Sodium: 137 mmol/L (ref 135–145)
Sodium: 136 mmol/L (ref 135–145)

## 2023-03-13 LAB — GLUCOSE, CAPILLARY
Glucose-Capillary: 92 mg/dL (ref 70–99)
Glucose-Capillary: 74 mg/dL (ref 70–99)
Glucose-Capillary: 112 mg/dL — ABNORMAL HIGH (ref 70–99)
Glucose-Capillary: 90 mg/dL (ref 70–99)

## 2023-03-13 MED ORDER — POTASSIUM CHLORIDE 20 MEQ PO PACK: 40.0000 meq | PACK | Freq: Once | ORAL | Status: DC

## 2023-03-13 MED ORDER — HEPARIN (PORCINE) 25000 UT/250ML-% IV SOLN
700.0000 [IU]/h | INTRAVENOUS | Status: DC
  Administered 2023-03-13: 700 [IU]/h via INTRAVENOUS
  Filled 2023-03-13: qty 250

## 2023-03-13 MED ORDER — POTASSIUM CHLORIDE 10 MEQ/100ML IV SOLN: 10.0000 meq | INTRAVENOUS | Status: DC

## 2023-03-13 MED ORDER — POTASSIUM CHLORIDE 20 MEQ PO PACK: 40.0000 meq | PACK | ORAL | Status: DC

## 2023-03-13 MED ORDER — ENOXAPARIN SODIUM 60 MG/0.6ML IJ SOSY
55.0000 mg | PREFILLED_SYRINGE | Freq: Once | INTRAMUSCULAR | Status: AC
  Administered 2023-03-13: 55 mg via SUBCUTANEOUS
  Filled 2023-03-13: qty 0.6

## 2023-03-13 MED ORDER — FUROSEMIDE 10 MG/ML IJ SOLN
40.0000 mg | Freq: Once | INTRAMUSCULAR | Status: AC
  Administered 2023-03-13: 40 mg via INTRAVENOUS
  Filled 2023-03-13: qty 4

## 2023-03-13 MED ORDER — POTASSIUM CHLORIDE CRYS ER 20 MEQ PO TBCR: 40.0000 meq | EXTENDED_RELEASE_TABLET | ORAL | Status: DC

## 2023-03-13 MED ORDER — POTASSIUM CHLORIDE 20 MEQ PO PACK
40.0000 meq | PACK | ORAL | Status: AC
  Administered 2023-03-13 (×3): 40 meq via ORAL
  Filled 2023-03-13 (×3): qty 2

## 2023-03-13 NOTE — Progress Notes (Signed)
ANTICOAGULATION CONSULT NOTE   Pharmacy Consult for IV heparin>>Lovenox Indication: chest pain/ACS  Allergies  Allergen Reactions   Amitriptyline Hcl Other (See Comments)    Caused "jaws to twist and lock"   Plavix [Clopidogrel Bisulfate] Hives   Sulfonamide Derivatives Other (See Comments)    UNKNOWN REACTION    Patient Measurements: Height: 5\' 5"  (165.1 cm) Weight: 53.8 kg (118 lb 9.7 oz) IBW/kg (Calculated) : 57 Heparin Dosing Weight: 52.3 kg  Vital Signs: Temp: 97.8 F (36.6 C) (08/09 0727) Temp Source: Oral (08/09 0727) BP: 110/59 (08/09 0727) Pulse Rate: 64 (08/09 0727)  Labs: Recent Labs    03/11/23 1625 03/11/23 1748 03/11/23 2340 03/12/23 0537 03/12/23 0554 03/12/23 0731 03/12/23 1537 03/13/23 0137 03/13/23 0635  HGB 12.1  --   --   --   --  11.0*  --  11.4*  --   HCT 38.3  --   --   --   --  34.4*  --  36.1  --   PLT 256  --   --   --   --  228  --  221  --   HEPARINUNFRC  --   --   --  0.11*  --   --  0.19* >1.10*  --   CREATININE 0.72  --   --  1.04*  --   --   --   --  1.04*  TROPONINIHS 199* 252* 261*  --  168*  --   --   --   --     Estimated Creatinine Clearance: 33.6 mL/min (A) (by C-G formula based on SCr of 1.04 mg/dL (H)).   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  Received bolus of 3100 units at 2055 pm.  On Brilinta 60 mg bid PTA, but no anticoagulants noted.  No plan for intervention. Plan for 48 hrs of heparin only which is to end tonight. D/w Dr. Izora Ribas, we will transition to lovenox x1 to complete course.    Goal of Therapy:  Heparin level 0.3-0.7 units/ml Monitor platelets by anticoagulation protocol: Yes   Plan:  Dc heparin Lovenox 55mg  SQ x1  Ulyses Southward, PharmD, BCIDP, AAHIVP, CPP Infectious Disease Pharmacist 03/13/2023 9:43 AM

## 2023-03-13 NOTE — Progress Notes (Signed)
PROGRESS NOTE    SIAH ROUNSVILLE  NWG:[id number] DOB: 1935-09-29 DOA: 03/11/2023 PCP: [name], MD  87/F with history of paroxysmal A-fib, PPM, CAD, chronic systolic and diastolic CHF, COPD, chronic hypoxic respiratory failure presented to the ED from her SNF with acute onset chest pain dyspnea and diaphoresis. -In the ED sats were 88% on 2 L, mildly tachypneic, BNP 1151, troponin 190, 260 -Chest x-ray with small pleural effusions and interstitial findings, CT chest noted small bilateral pleural effusions -Case was discussed with cardiology and she was transferred to [facility] for cards eval -Treated with IV heparin X40 8 hours, 2D echo with preserved EF, no wall motion abnormalities, medical management recommended  Subjective: -Feels a little better, some improvement in dyspnea, denies chest pain as long as she is laying in bed  Assessment and Plan:  NSTEMI  -Presented with acute onset chest pain and dyspnea, troponin peaked at 260, EKG unchanged -History of multivessel CAD and complex PCI to RCA in 10/22 -Continue aspirin, metoprolol, statin, also started on Brilinta  -She will complete 48 hours of heparin -Continue Imdur -2D echo with preserved EF, no wall motion abnormality  Acute on chronic diastolic CHF -Repeat echo with EF of 55%, mildly reduced RV, no wall motion abnormalities -Slightly volume overloaded, with bilateral pleural effusions on admission, diuresed with multiple doses of IV Lasix, volume status is improving  Severe hypokalemia -Replete  Chronic respiratory failure with hypoxia (HCC) On chronic O2- 2L.  Stable, monitor  COPD (chronic obstructive pulmonary disease) (HCC) -Nebs.  And scheduled.  Atrial fibrillation, chronic (HCC) Pacemaker status.   -Currently in sinus rhythm, followed by cards, not on anticoagulation due to frequent falls and confusion. -Continue amiodarone and metoprolol  Essential hypertension Stable. -Resume metoprolol,  DVT prophylaxis:  Heparin Code Status: DNR Family Communication: No family at bedside Disposition Plan: Back to SNF likely 1 to 2 days  Consultants:    Procedures:   Antimicrobials:    Objective: Vitals:   03/12/23 2159 03/12/23 2342 03/13/23 0433 03/13/23 0727  BP:  (!) 100/58 (!) 102/54 (!) 110/59  Pulse: (!) 59 62 60 64  Resp: (!) 25 20 20 20   Temp:  97.6 F (36.4 C) 97.7 F (36.5 C) 97.8 F (36.6 C)  TempSrc:  Oral Oral Oral  SpO2: 99% 100% 93% 98%  Weight:   53.8 kg   Height:        Intake/Output Summary (Last 24 hours) at 03/13/2023 1030 Last data filed at 03/13/2023 0851 Gross per 24 hour  Intake 475.98 ml  Output 1750 ml  Net -1274.02 ml   Filed Weights   03/11/23 1820 03/12/23 0126 03/13/23 0433  Weight: 52.3 kg 53.7 kg 53.8 kg    Examination:  General exam: Frail elderly chronically ill female laying in bed, AAOx3 HEENT: No JVD CVS: S1-S2, regular rhythm Lungs: Decreased breath sounds at the bases Abdomen: Soft, nontender, bowel sounds present Extremities: No edema  Skin: No rashes Psychiatry:  Mood & affect appropriate.     Data Reviewed:   CBC: Recent Labs  Lab 03/11/23 1625 03/12/23 0731 03/13/23 0137  WBC 8.4 8.9 8.3  HGB 12.1 11.0* 11.4*  HCT 38.3 34.4* 36.1  MCV 95.8 92.7 95.5  PLT 256 228 221   Basic Metabolic Panel: Recent Labs  Lab 03/11/23 1625 03/12/23 0537 03/13/23 0635  NA 139 139 137  K 4.7 3.6 2.7*  CL 101 102 96*  CO2 28 26 27   GLUCOSE 115* 79 94  BUN  11 13 18   CREATININE 0.72 1.04* 1.04*  CALCIUM 9.6 8.9 8.6*   GFR: Estimated Creatinine Clearance: 33.6 mL/min (A) (by C-G formula based on SCr of 1.04 mg/dL (H)). Liver Function Tests: Recent Labs  Lab 03/11/23 1625  AST 28  ALT 16  ALKPHOS 71  BILITOT 1.2  PROT 7.5  ALBUMIN 3.4*   Recent Labs  Lab 03/11/23 1625  LIPASE 30   No results for input(s): "AMMONIA" in the last 168 hours. Coagulation Profile: No results for input(s): "INR", "PROTIME" in the last 168  hours. Cardiac Enzymes: No results for input(s): "CKTOTAL", "CKMB", "CKMBINDEX", "TROPONINI" in the last 168 hours. BNP (last 3 results) No results for input(s): "PROBNP" in the last 8760 hours. HbA1C: No results for input(s): "HGBA1C" in the last 72 hours. CBG: Recent Labs  Lab 03/12/23 0555 03/12/23 1144 03/12/23 1515 03/12/23 2055 03/13/23 0609  GLUCAP 84 92 81 133* 92   Lipid Profile: No results for input(s): "CHOL", "HDL", "LDLCALC", "TRIG", "CHOLHDL", "LDLDIRECT" in the last 72 hours. Thyroid Function Tests: No results for input(s): "TSH", "T4TOTAL", "FREET4", "T3FREE", "THYROIDAB" in the last 72 hours. Anemia Panel: No results for input(s): "VITAMINB12", "FOLATE", "FERRITIN", "TIBC", "IRON", "RETICCTPCT" in the last 72 hours. Urine analysis:    Component Value Date/Time   COLORURINE STRAW (A) 03/11/2023 1829   APPEARANCEUR CLEAR 03/11/2023 1829   LABSPEC 1.014 03/11/2023 1829   PHURINE 7.0 03/11/2023 1829   GLUCOSEU >=500 (A) 03/11/2023 1829   HGBUR SMALL (A) 03/11/2023 1829   BILIRUBINUR NEGATIVE 03/11/2023 1829   KETONESUR NEGATIVE 03/11/2023 1829   PROTEINUR NEGATIVE 03/11/2023 1829   UROBILINOGEN 0.2 12/07/2009 1532   NITRITE NEGATIVE 03/11/2023 1829   LEUKOCYTESUR NEGATIVE 03/11/2023 1829   Sepsis Labs: @LABRCNTIP (procalcitonin:4,lacticidven:4)  )No results found for this or any previous visit (from the past 240 hour(s)).   Radiology Studies: ECHOCARDIOGRAM COMPLETE  Result Date: 03/12/2023    ECHOCARDIOGRAM REPORT   Patient Name:   DAURICE SHADDIX Date of Exam: 03/12/2023 Medical Rec #:  [id number]    Height:       65.0 in Accession #:    [id number]   Weight:       118.4 lb Date of Birth:  [birth year]    BSA:          1.583 m Patient Age:    87 years     BP:           120/69 mmHg Patient Gender: F            HR:           62 bpm. Exam Location:  Inpatient Procedure: 2D Echo, Cardiac Doppler and Color Doppler Indications:    CHF-Acute Diastolic I50.31  History:         Patient has prior history of Echocardiogram examinations, most                 recent 03/20/2021. Abnormal ECG and Pacemaker.  Sonographer:    Darlys Gales Referring Phys: Zannie Cove IMPRESSIONS  1. Left ventricular ejection fraction, by estimation, is 55 to 60%. The left ventricle has normal function. The left ventricle has no regional wall motion abnormalities. There is mild asymmetric left ventricular hypertrophy of the basal-septal segment. Left ventricular diastolic parameters are indeterminate.  2. Right ventricular systolic function is mildly reduced. The right ventricular size is normal. There is mildly elevated pulmonary artery systolic pressure. The estimated right ventricular systolic pressure is 38.8 mmHg.  3. Left atrial size was  severely dilated.  4. Right atrial size was moderately dilated.  5. The mitral valve is degenerative. Moderate mitral valve regurgitation. No evidence of mitral stenosis. Severe mitral annular calcification.  6. The aortic valve is abnormal. There is mild calcification of the aortic valve. There is moderate thickening of the aortic valve. Aortic valve regurgitation is mild. Aortic valve sclerosis/calcification is present, without any evidence of aortic stenosis.  7. The inferior vena cava is normal in size with greater than 50% respiratory variability, suggesting right atrial pressure of 3 mmHg. FINDINGS  Left Ventricle: Left ventricular ejection fraction, by estimation, is 55 to 60%. The left ventricle has normal function. The left ventricle has no regional wall motion abnormalities. The left ventricular internal cavity size was normal in size. There is  mild asymmetric left ventricular hypertrophy of the basal-septal segment. Left ventricular diastolic function could not be evaluated due to mitral annular calcification (moderate or greater). Left ventricular diastolic parameters are indeterminate. Right Ventricle: The right ventricular size is normal. No increase in  right ventricular wall thickness. Right ventricular systolic function is mildly reduced. There is mildly elevated pulmonary artery systolic pressure. The tricuspid regurgitant velocity  is 2.99 m/s, and with an assumed right atrial pressure of 3 mmHg, the estimated right ventricular systolic pressure is 38.8 mmHg. Left Atrium: Left atrial size was severely dilated. Right Atrium: Right atrial size was moderately dilated. Pericardium: There is no evidence of pericardial effusion. Mitral Valve: The mitral valve is degenerative in appearance. Severe mitral annular calcification. Moderate mitral valve regurgitation. No evidence of mitral valve stenosis. Tricuspid Valve: The tricuspid valve is normal in structure. Tricuspid valve regurgitation is mild . No evidence of tricuspid stenosis. Aortic Valve: The aortic valve is abnormal. There is mild calcification of the aortic valve. There is moderate thickening of the aortic valve. Aortic valve regurgitation is mild. Aortic regurgitation PHT measures 689 msec. Aortic valve sclerosis/calcification is present, without any evidence of aortic stenosis. Aortic valve mean gradient measures 3.0 mmHg. Aortic valve peak gradient measures 7.5 mmHg. Aortic valve area, by VTI measures 1.94 cm. Pulmonic Valve: The pulmonic valve was normal in structure. Pulmonic valve regurgitation is mild to moderate. No evidence of pulmonic stenosis. Aorta: The aortic root is normal in size and structure. Venous: The inferior vena cava is normal in size with greater than 50% respiratory variability, suggesting right atrial pressure of 3 mmHg. IAS/Shunts: No atrial level shunt detected by color flow Doppler. Additional Comments: A device lead is visualized in the right atrium and right ventricle.  LEFT VENTRICLE PLAX 2D LVIDd:         4.70 cm   Diastology LVIDs:         3.90 cm   LV e' medial:    5.11 cm/s LV PW:         0.70 cm   LV E/e' medial:  17.8 LV IVS:        1.20 cm   LV e' lateral:   7.62 cm/s  LVOT diam:     1.80 cm   LV E/e' lateral: 12.0 LV SV:         57 LV SV Index:   36 LVOT Area:     2.54 cm  RIGHT VENTRICLE RV S prime:     11.20 cm/s TAPSE (M-mode): 2.5 cm LEFT ATRIUM           Index        RIGHT ATRIUM  Index LA Vol (A2C): 73.1 ml 46.18 ml/m  RA Area:     14.70 cm LA Vol (A4C): 93.9 ml 59.32 ml/m  RA Volume:   31.40 ml  19.84 ml/m  AORTIC VALVE AV Area (Vmax):    1.79 cm AV Area (Vmean):   2.13 cm AV Area (VTI):     1.94 cm AV Vmax:           137.00 cm/s AV Vmean:          87.100 cm/s AV VTI:            0.294 m AV Peak Grad:      7.5 mmHg AV Mean Grad:      3.0 mmHg LVOT Vmax:         96.20 cm/s LVOT Vmean:        72.800 cm/s LVOT VTI:          0.224 m LVOT/AV VTI ratio: 0.76 AI PHT:            689 msec  AORTA Ao Root diam: 3.40 cm MITRAL VALVE               TRICUSPID VALVE MV Area (PHT): 2.83 cm    TR Peak grad:   35.8 mmHg MV Decel Time: 268 msec    TR Vmax:        299.00 cm/s MV E velocity: 91.10 cm/s MV A velocity: 35.30 cm/s  SHUNTS MV E/A ratio:  2.58        Systemic VTI:  0.22 m                            Systemic Diam: 1.80 cm Weston Brass MD Electronically signed by Weston Brass MD Signature Date/Time: 03/12/2023/2:46:38 PM    Final    CT ABDOMEN PELVIS W CONTRAST  Result Date: 03/11/2023 CLINICAL DATA:  Acute generalized abdominal pain. EXAM: CT ABDOMEN AND PELVIS WITH CONTRAST TECHNIQUE: Multidetector CT imaging of the abdomen and pelvis was performed using the standard protocol following bolus administration of intravenous contrast. RADIATION DOSE REDUCTION: This exam was performed according to the departmental dose-optimization program which includes automated exposure control, adjustment of the mA and/or kV according to patient size and/or use of iterative reconstruction technique. CONTRAST:  OMNIPAQUE IOHEXOL 300 MG/ML  SOLN COMPARISON:  November 24, 2022. FINDINGS: Lower chest: Small bilateral pleural effusions are noted with minimal adjacent  subsegmental atelectasis. Hepatobiliary: No focal liver abnormality is seen. Status post cholecystectomy. No biliary dilatation. Pancreas: Stable pancreatic ductal dilatation. No acute inflammation is noted. Spleen: Calcified granulomas are noted in the spleen. Adrenals/Urinary Tract: Adrenal glands are unremarkable. Kidneys are normal, without renal calculi, focal lesion, or hydronephrosis. Bladder is unremarkable. Stomach/Bowel: Stomach is unremarkable. There is no evidence of bowel obstruction or inflammation. Vascular/Lymphatic: Aortic atherosclerosis. No enlarged abdominal or pelvic lymph nodes. Reproductive: Status post hysterectomy. No adnexal masses. Other: No abdominal wall hernia or abnormality. No abdominopelvic ascites. Musculoskeletal: No acute or significant osseous findings. IMPRESSION: Small bilateral pleural effusions with minimal adjacent subsegmental atelectasis. No definite acute abnormality seen in the abdomen or pelvis. Aortic Atherosclerosis (ICD10-I70.0). Electronically Signed   By: Lupita Raider M.D.   On: 03/11/2023 17:52   DG Chest 2 View  Result Date: 03/11/2023 CLINICAL DATA:  Shortness of breath EXAM: CHEST - 2 VIEW COMPARISON:  X-ray 01/19/2023 and older FINDINGS: Enlarged heart with vascular congestion and interstitial changes. Small effusions. Calcified tortuous aorta.  Left upper chest pacemaker. No pneumothorax. Osteopenia with degenerative changes. Surgical Burrs along the right humeral head. IMPRESSION: Enlarged heart with calcified tortuous aorta.  Pacemaker. Developing small effusions with the adjacent opacity and interstitial changes. Recommend follow-up Electronically Signed   By: Karen Kays M.D.   On: 03/11/2023 17:44     Scheduled Meds:  aspirin EC  81 mg Oral Daily   atorvastatin  80 mg Oral Daily   enoxaparin (LOVENOX) injection  55 mg Subcutaneous Once   furosemide  40 mg Intravenous Once   insulin aspart  0-5 Units Subcutaneous QHS   insulin aspart  0-9  Units Subcutaneous TID WC   isosorbide mononitrate  30 mg Oral Daily   metoprolol succinate  50 mg Oral BID   potassium chloride  40 mEq Oral Q4H   ticagrelor  60 mg Oral BID   Continuous Infusions:     LOS: 1 day    Time spent:    Zannie Cove, MD [facility]   03/13/2023, 10:30 AM

## 2023-03-13 NOTE — Progress Notes (Signed)
ANTICOAGULATION CONSULT NOTE   Pharmacy Consult for IV heparin  Indication: chest pain/ACS  Allergies  Allergen Reactions   Amitriptyline Hcl Other (See Comments)    Caused "jaws to twist and lock"   Plavix [Clopidogrel Bisulfate] Hives   Sulfonamide Derivatives Other (See Comments)    UNKNOWN REACTION    Patient Measurements: Height: 5\' 5"  (165.1 cm) Weight: 53.7 kg (118 lb 6.2 oz) IBW/kg (Calculated) : 57 Heparin Dosing Weight: 52.3 kg  Vital Signs: Temp: 97.6 F (36.4 C) (08/08 2342) Temp Source: Oral (08/08 2342) BP: 100/58 (08/08 2342) Pulse Rate: 62 (08/08 2342)  Labs: Recent Labs    03/11/23 1625 03/11/23 1748 03/11/23 2340 03/12/23 0537 03/12/23 0554 03/12/23 0731 03/12/23 1537 03/13/23 0137  HGB 12.1  --   --   --   --  11.0*  --  11.4*  HCT 38.3  --   --   --   --  34.4*  --  36.1  PLT 256  --   --   --   --  228  --  221  HEPARINUNFRC  --   --   --  0.11*  --   --  0.19* >1.10*  CREATININE 0.72  --   --  1.04*  --   --   --   --   TROPONINIHS 199* 252* 261*  --  168*  --   --   --     Estimated Creatinine Clearance: 33.5 mL/min (A) (by C-G formula based on SCr of 1.04 mg/dL (H)).   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  Received bolus of 3100 units at 2055 pm.  On Brilinta 60 mg bid PTA, but no anticoagulants noted.  8/9 AM update:  Heparin level elevated  Drawn from arm opposite heparin  Goal of Therapy:  Heparin level 0.3-0.7 units/ml Monitor platelets by anticoagulation protocol: Yes   Plan:  Hold heparin x 1 hr Re-start heparin at 700 units/hr 1300 heparin level  Abran Duke, PharmD, BCPS Clinical Pharmacist Phone: [phone]

## 2023-03-13 NOTE — Progress Notes (Signed)
CSW was informed pt is from [facility]. CSW spoke with Eunice Blase from [facility] who confirm pt is LTC and can return when medically stable.

## 2023-03-13 NOTE — Patient Instructions (Signed)
Visit Information  Thank you for taking time to visit with me today. Please don't hesitate to contact me if I can be of assistance to you.   Following are the goals we discussed today:   Goals Addressed             This Visit's Progress    COMPLETED: [facility] care coordination services   On track    Interventions Today    Flowsheet Row Most Recent Value  Chronic Disease   Chronic disease during today's visit Other  [updated from [facility] hospital liaison with case closure Patient was discharged to skilled nursing and will remain at facility]  General Interventions   General Interventions Discussed/Reviewed General Interventions Reviewed, Level of Care  Level of Care Skilled Nursing Facility              Our next appointment is  n/a  on n/a at n/a  Please call the care guide team at [phone] if you need to cancel or reschedule your appointment.   If you are experiencing a Mental Health or Behavioral Health Crisis or need someone to talk to, please call the Suicide and Crisis Lifeline: 988 call the [place] National Suicide Prevention Lifeline: 913-054-3229 or TTY: 803 694 9186 TTY 715-560-7037) to talk to a trained counselor call 1-800-273-TALK (toll free, 24 hour hotline) call the [facility]: [phone] call 911   Patient verbalizes understanding of instructions and care plan provided today and agrees to view in MyChart. Active MyChart status and patient understanding of how to access instructions and care plan via MyChart confirmed with patient.     The patient has been provided with contact information for the care management team and has been advised to call with any health related questions or concerns.    L. Noelle Penner, RN, BSN, CCM [facility] Care Management Community Coordinator Office number [phone]

## 2023-03-13 NOTE — Plan of Care (Signed)
Pt trained regarding discharge planning for following medical regimen. Pt stated understanding

## 2023-03-13 NOTE — Progress Notes (Addendum)
Due to patient's NPO status, change kcl to IV x6 dose + x1 this PM per Dr. Jomarie Longs.  Addendum  Change back to x 3 doses  Ulyses Southward, PharmD, [place], AAHIVP, CPP Infectious Disease Pharmacist 03/13/2023 8:28 AM

## 2023-03-13 NOTE — Patient Outreach (Signed)
  Care Coordination   Case closure   Visit Note   03/13/2023 Name: DEMANI ZULOAGA MRN: [id number] DOB: [birth year]  TAMULA CHIAPPONE is a 87 y.o. year old female who sees Margo Aye, Kathleene Hazel, MD for primary care. I  was updated from [facility] hospital liaison  What matters to the patients health and wellness today?  updated from [facility] hospital liaison with case closure Patient was discharged to skilled nursing and will remain at facility     Goals Addressed             This Visit's Progress    COMPLETED: [facility] care coordination services   On track    Interventions Today    Flowsheet Row Most Recent Value  Chronic Disease   Chronic disease during today's visit Other  [updated from [facility] hospital liaison with case closure Patient was discharged to skilled nursing and will remain at facility]  General Interventions   General Interventions Discussed/Reviewed General Interventions Reviewed, Level of Care  Level of Care Skilled Nursing Facility              SDOH assessments and interventions completed:  No     Care Coordination Interventions:  Yes, provided   Follow up plan: No further intervention required.   Encounter Outcome:  Pt. Visit Completed    L. Noelle Penner, RN, BSN, CCM [facility] Care Management Community Coordinator Office number [phone]

## 2023-03-13 NOTE — Evaluation (Signed)
Physical Therapy Evaluation Patient Details Name: [name] MRN: [id number] DOB: 01-24-1936 Today's Date: 03/13/2023  History of Present Illness  Pt is an 87 y/o female admitted 8/7 with complaints of Chest pain, difficulty breathing, but without associated diaphoresis.   PMHx:  afib, Chronic back pain, COPD, HTN, HOH, neuromuscular d/o, tachy/brady syndrome.  Clinical Impression  Pt admitted with/for CP and difficulty breathing.  Pt needing minimal to CGA assist overall.  Pt currently limited functionally due to the problems listed. ( See problems list.)   Pt will benefit from PT to maximize function and safety in order to get ready for next venue listed below.         If plan is discharge home, recommend the following: A little help with walking and/or transfers;A little help with bathing/dressing/bathroom   Can travel by private vehicle   Yes    Equipment Recommendations None recommended by PT  Recommendations for Other Services       Functional Status Assessment Patient has had a recent decline in their functional status and demonstrates the ability to make significant improvements in function in a reasonable and predictable amount of time.     Precautions / Restrictions Precautions Precautions: Fall      Mobility  Bed Mobility Overal bed mobility: Needs Assistance Bed Mobility: Supine to Sit (lower HOB)     Supine to sit: Supervision          Transfers Overall transfer level: Needs assistance Equipment used: Rolling walker (2 wheels) Transfers: Sit to/from Stand Sit to Stand: Contact guard assist           General transfer comment: cues for hand placement    Ambulation/Gait Ambulation/Gait assistance: Min assist Gait Distance (Feet): 20 Feet Assistive device: Rolling walker (2 wheels) Gait Pattern/deviations: Step-through pattern   Gait velocity interpretation: <1.8 ft/sec, indicate of risk for recurrent falls   General Gait Details: slow,  periods of mild instability, good use of the RW, but needing minimal assist overall  Stairs            Wheelchair Mobility     Tilt Bed    Modified Rankin (Stroke Patients Only)       Balance Overall balance assessment: Needs assistance Sitting-balance support: No upper extremity supported Sitting balance-Leahy Scale: Good     Standing balance support: Single extremity supported, No upper extremity supported, During functional activity Standing balance-Leahy Scale: Fair                               Pertinent Vitals/Pain Pain Assessment Pain Assessment: Faces Faces Pain Scale: Hurts a little bit Pain Location: chest Pain Descriptors / Indicators: Tightness Pain Intervention(s): Monitored during session    Home Living Family/patient expects to be discharged to:: Skilled nursing facility                        Prior Function Prior Level of Function : Needs assist             Mobility Comments: pt reports she gets few chances to walk in the SNF.  Does use RW and accepts assist when does walk ADLs Comments: Assist for bathing in a shower room     Extremity/Trunk Assessment   Upper Extremity Assessment Upper Extremity Assessment: Generalized weakness;Overall [facility] for tasks assessed    Lower Extremity Assessment Lower Extremity Assessment: Generalized weakness;Overall [facility] for tasks assessed  Cervical / Trunk Assessment Cervical / Trunk Assessment: Kyphotic  Communication   Communication Communication: Hearing impairment  Cognition Arousal: Alert Behavior During Therapy: WFL for tasks assessed/performed Overall Cognitive Status: Difficult to assess                                          General Comments General comments (skin integrity, edema, etc.): VSS on RA, sats up to 100% and HR in the 80/90's    Exercises Other Exercises Other Exercises: general  warm up ROM exercise prior to mobility.    Assessment/Plan    PT Assessment Patient needs continued PT services  PT Problem List Decreased strength;Decreased activity tolerance;Decreased balance;Decreased mobility       PT Treatment Interventions DME instruction;Gait training;Functional mobility training;Therapeutic activities;Patient/family education    PT Goals (Current goals can be found in the Care Plan section)  Acute Rehab PT Goals Patient Stated Goal: get walking more PT Goal Formulation: With patient Time For Goal Achievement: 03/27/23 Potential to Achieve Goals: Good    Frequency Min 1X/week     Co-evaluation               AM-PAC PT "6 Clicks" Mobility  Outcome Measure Help needed turning from your back to your side while in a flat bed without using bedrails?: None Help needed moving from lying on your back to sitting on the side of a flat bed without using bedrails?: None Help needed moving to and from a bed to a chair (including a wheelchair)?: None Help needed standing up from a chair using your arms (e.g., wheelchair or bedside chair)?: A Little Help needed to walk in hospital room?: A Little Help needed climbing 3-5 steps with a railing? : A Lot 6 Click Score: 20    End of Session   Activity Tolerance: Patient tolerated treatment well Patient left: in chair;with call bell/phone within reach Nurse Communication: Mobility status PT Visit Diagnosis: Other abnormalities of gait and mobility (R26.89);Muscle weakness (generalized) (M62.81);Difficulty in walking, not elsewhere classified (R26.2)    Time: 8119-1478 PT Time Calculation (min) (ACUTE ONLY): 40 min   Charges:   PT Evaluation $PT Eval Moderate Complexity: 1 Mod PT Treatments $Gait Training: 8-22 mins $Therapeutic Activity: 8-22 mins PT General Charges $$ ACUTE PT VISIT: 1 Visit         03/13/2023  Jacinto Halim., PT Acute Rehabilitation Services [phone]  (office)  Eliseo Gum  03/13/2023, 11:38 AM

## 2023-03-13 NOTE — Patient Instructions (Signed)
Visit Information  Thank you for taking time to visit with me today. Please don't hesitate to contact me if I can be of assistance to you.   Following are the goals we discussed today:   Goals Addressed             This Visit's Progress    [facility] care coordination services   On track    Interventions Today    Flowsheet Row Most Recent Value  Chronic Disease   Chronic disease during today's visit Other  [hospital admission, pending facility placement, home safety, medicaid active with [facility] SW]  General Interventions   General Interventions Discussed/Reviewed General Interventions Reviewed, [employer], Level of Care  Level of Care Skilled Nursing Facility, Public librarian Medicaid  Education Interventions   Education Provided Provided Education  [facility placement from hospital, medicaid application process, resources/assist]  Provided Verbal Education On [employer], Production assistant, radio  Mental Health Interventions   Mental Health Discussed/Reviewed Mental Health Reviewed, Coping Strategies  Safety Interventions   Safety Discussed/Reviewed Safety Reviewed              Our next appointment is by telephone on Pending hospital services at ?  Please call the care guide team at [phone] if you need to cancel or reschedule your appointment.   If you are experiencing a Mental Health or Behavioral Health Crisis or need someone to talk to, please call the Suicide and Crisis Lifeline: 988 call the [place] National Suicide Prevention Lifeline: 947-072-7421 or TTY: 586-613-6879 TTY 9791649443) to talk to a trained counselor call 1-800-273-TALK (toll free, 24 hour hotline) call the [facility]: 508-769-8610 call 911   Patient verbalizes understanding of instructions and care plan provided today and agrees to view in MyChart. Active MyChart status and patient understanding of how to access instructions and care plan  via MyChart confirmed with patient.     The patient has been provided with contact information for the care management team and has been advised to call with any health related questions or concerns.    L. Noelle Penner, RN, BSN, CCM [facility] Care Management Community Coordinator Office number [phone]

## 2023-03-13 NOTE — Progress Notes (Signed)
Progress Note  Patient Name: [name] Date of Encounter: 03/13/2023 Primary Cardiologist: Nona Dell, MD   Subjective   Overnight Echo with no WMAs. Patient notes no CP.  She's notes pain in her neck and R hip from fall PTA.  Notes that she still cannot breath well  Vital Signs    Vitals:   03/12/23 2159 03/12/23 2342 03/13/23 0433 03/13/23 0727  BP:  (!) 100/58 (!) 102/54 (!) 110/59  Pulse: (!) 59 62 60 64  Resp: (!) 25 20 20 20   Temp:  97.6 F (36.4 C) 97.7 F (36.5 C) 97.8 F (36.6 C)  TempSrc:  Oral Oral Oral  SpO2: 99% 100% 93% 98%  Weight:   53.8 kg   Height:        Intake/Output Summary (Last 24 hours) at 03/13/2023 0929 Last data filed at 03/13/2023 0851 Gross per 24 hour  Intake 475.98 ml  Output 1750 ml  Net -1274.02 ml   Filed Weights   03/11/23 1820 03/12/23 0126 03/13/23 0433  Weight: 52.3 kg 53.7 kg 53.8 kg    Physical Exam   GEN: No acute distress.   Neck: No JVD Cardiac: RRR, systolic murmur, no rubs, or gallops.  Respiratory: Clear to auscultation bilaterally. GI: Soft, nontender, non-distended  MS: Non pitting edema  Labs   Telemetry: SR with rare A pacing   Chemistry Recent Labs  Lab 03/11/23 1625 03/12/23 0537 03/13/23 0635  NA 139 139 137  K 4.7 3.6 2.7*  CL 101 102 96*  CO2 28 26 27   GLUCOSE 115* 79 94  BUN 11 13 18   CREATININE 0.72 1.04* 1.04*  CALCIUM 9.6 8.9 8.6*  PROT 7.5  --   --   ALBUMIN 3.4*  --   --   AST 28  --   --   ALT 16  --   --   ALKPHOS 71  --   --   BILITOT 1.2  --   --   GFRNONAA >60 53* 53*  ANIONGAP 10 11 14      Hematology Recent Labs  Lab 03/11/23 1625 03/12/23 0731 03/13/23 0137  WBC 8.4 8.9 8.3  RBC 4.00 3.71* 3.78*  HGB 12.1 11.0* 11.4*  HCT 38.3 34.4* 36.1  MCV 95.8 92.7 95.5  MCH 30.3 29.6 30.2  MCHC 31.6 32.0 31.6  RDW 18.7* 18.7* 18.8*  PLT 256 228 221    Cardiac EnzymesNo results for input(s): "TROPONINI" in the last 168 hours. No results for input(s): "TROPIPOC" in  the last 168 hours.   BNP Recent Labs  Lab 03/11/23 1625  BNP 1,151.0*     DDimer No results for input(s): "DDIMER" in the last 168 hours.   Cardiac Studies   Cardiac Studies & Procedures   CARDIAC CATHETERIZATION  CARDIAC CATHETERIZATION 05/28/2021  Narrative   ---------- IVUS-guided PTCA/PCI: Combination SHOCKWAVE LITHOTRIPSY, & SCOREFLEX ANGIOPLASTY with GUIDELINER support-----------   LESION #1 prox RCA lesion is 85% stenosed distal to previous stent.   Scoring balloon angioplasty was performed following SHOCKWAVE LITHOTRIPSY (3.0 x 12) BALLN (2 runs of 10 pulses) using a BALLN SCOREFLEX 3.0X15.   Lesion #2 mid RCA to Dist RCA lesion is 65% stenosed.   Scoring balloon angioplasty was performed using a BALLN SCOREFLEX 3.0X15.  With Use of GuideLiner Catheter Extension   A drug-eluting stent was successfully placed covering both LESIONS #1 and #2 (overlapping previous stent) using a SYNERGY XD 3.0X48.  Postdilated to 3.6 mm   Post intervention, there  is a 0% residual stenosis in both these LESIONS #1 and #2.Marland Kitchen   LESION #3 Ost RCA to Prox RCA previously placed stents are 70% stenosed.   Scoring balloon angioplasty was performed using a BALLN SCOREFLEX 3.0X15 -> followed by post dilation with 3.5 mm x 15 mm Cogswell balloon. ->  Achieved 3.5 mm.   Post intervention, there is a 10% residual in-stent restenosis.   ---------------------------------   Lesion #4 Ost RCA lesion is 75% stenosed.   A drug-eluting stent was successfully placed at the ostium and overlapping previous stent proximally, using a SYNERGY XD 3.50X12.  Postdilated to 3.65 mm.   Post intervention, there is a 0% residual stenosis.   Post intervention, there is a 0% residual stenosis with exception of the 10% residual ISR in the previously placed stents which are likely undersized 2.75 mm stents.  SUMMARY Successful Complext DES PCI of Ostial & mid to Distal RCA using Shockwave Lithotrypsy and ScoreFlex Scoring Balloon  Angioplasty with Guideliner Extension Catheter. Mid-Distal RCA tandem lesions: Finally treated with distally overlapping SYNERGY XD (DES) 3.0X48 mm postdilated to 3.6 mm High-pressure score flex angioplasty followed by post dilation of previously placed ostial and proximal RCA stent dilating up to 3.5 mm-residual 10% Ostial RCA PCI with SYNERGY XD 3.5X12. ->  Deployed at 3.65 mm. --> All lesions segments reduced to 0% with exception of the in-stent restenosis portion which was 10%. --> TIMI-3 flow pre and post.  Findings Coronary Findings Diagnostic  Dominance: Right  Left Main Vessel was not injected. Ost LM to Dist LM lesion is 35% stenosed.  Left Anterior Descending There is moderate diffuse disease throughout the vessel. Ost LAD to Prox LAD lesion is 75% stenosed. Mid LAD lesion is 80% stenosed. Mid LAD to Dist LAD lesion is 80% stenosed.  Left Circumflex There is moderate diffuse disease throughout the vessel. Mid Cx to Dist Cx lesion is 70% stenosed. Dist Cx lesion is 70% stenosed.  First Obtuse Marginal Branch 1st Mrg lesion is 80% stenosed.  Right Coronary Artery Vessel is large. There is moderate diffuse disease throughout the vessel. Ost RCA lesion is 75% stenosed. The lesion is eccentric. Potentially related to guide catheter Ost RCA to Prox RCA lesion is 70% stenosed. The lesion was previously treated using a drug eluting stent over 2 years ago. Previously placed stent displays restenosis. Ultrasound (IVUS) was performed. Minimum lumen diameter: 2 mm. Severe plaque burden was detected. IVUS has determined that the lesion is fibrinous and heterogeneous. Prox RCA lesion is 85% stenosed. Mid RCA to Dist RCA lesion is 65% stenosed.  Right Ventricular Branch Vessel is small in size.  Intervention  Ost RCA lesion Stent Lesion length:  8 mm. CATH LAUNCHER 6FR AL.75 guide catheter was inserted. Lesion crossed with guidewire using a WIRE ASAHI PROWATER 180CM. Pre-stent  angioplasty was not performed. A drug-eluting stent was successfully placed using a SYNERGY XD 3.50X12. Maximum pressure: 18 atm. Inflation time: 20 sec. Stent strut is well apposed. Postdilated with stent balloon to 3.65 mm Stent overlaps previously placed stent. Post-stent angioplasty was performed. Maximum pressure:  20 atm. Inflation time: 20 sec. Stent balloon, high ATM Stent (Also treats lesions: Ost RCA to Prox RCA, Prox RCA, and Mid RCA to Dist RCA) Lesion length:  40 mm. Lesion crossed with guidewire using a WIRE ASAHI PROWATER 180CM. Pre-stent angioplasty was performed using a BALLN SAPPHIRE 2.0X12. Maximum pressure:  12 atm. Inflation time:  20 sec. Initial PTCA followed by Shockwave &amp; ScoreFlex A drug-eluting  stent was successfully placed using a SYNERGY XD 3.0X48. Maximum pressure: 18 atm. Inflation time: 20 sec. Stent strut is well apposed. Postdilated to 3.6 mm Stent overlaps previously placed stent. Post-stent angioplasty was performed using a BALL SAPPHIRE NC24 3.5X26. Maximum pressure:  18 atm. Inflation time:  10 sec. GuideLiner catheter was used to advance the Scoreflex balloon to the distal part of the lesion, and then also to place the stent distally. Post-Intervention Lesion Assessment The intervention was successful. Pre-interventional TIMI flow is 3. Post-intervention TIMI flow is 3. Treated lesion length:  8 mm. No complications occurred at this lesion. There is a 0% residual stenosis post intervention.  Ost RCA to Prox RCA lesion Angioplasty Lesion length:  28 mm. CATHETER SHOCKWAVE 3.0X12 guide catheter was inserted. WIRE ASAHI PROWATER 180CM guidewire used to cross lesion. Scoring balloon angioplasty was performed using a BALLN SCOREFLEX 3.0X15. Maximum pressure: 16 atm. Inflation time: 20 sec. Stent (Also treats lesions: Ost RCA, Prox RCA, and Mid RCA to Dist RCA) See details in Ost RCA lesion. Post-Intervention Lesion Assessment The intervention was successful.  Pre-interventional TIMI flow is 3. Post-intervention TIMI flow is 3. Treated lesion length:  28 mm. No complications occurred at this lesion. There is a 10% residual stenosis post intervention.  Prox RCA lesion Angioplasty Lesion length:  44 mm. CATHETER SHOCKWAVE 3.0X12 guide catheter was inserted. WIRE ASAHI PROWATER 180CM guidewire used to cross lesion. Scoring balloon angioplasty was performed using a BALLN SCOREFLEX 3.0X15. BALLN SAPPHIRE 2.0X12 3.0 mm  x 12 mm shockwave lithotripsy balloon -&gt; 2 cycles of 10 pulses 3.0 mm x 15 mm core flex balloon at this lesion and several other lesions downstream.. Maximum pressure: 14 atm. Inflation time: 15 sec. Stent (Also treats lesions: Ost RCA, Ost RCA to Prox RCA, and Mid RCA to Dist RCA) See details in Ost RCA lesion. Post-Intervention Lesion Assessment The intervention was successful. Pre-interventional TIMI flow is 3. Post-intervention TIMI flow is 3. Treated lesion length:  44 mm. No complications occurred at this lesion. There is a 0% residual stenosis post intervention.  Mid RCA to Dist RCA lesion Stent (Also treats lesions: Ost RCA, Ost RCA to Prox RCA, and Prox RCA) See details in Ost RCA lesion. Post-Intervention Lesion Assessment The intervention was successful. Pre-interventional TIMI flow is 3. Post-intervention TIMI flow is 3. No complications occurred at this lesion. There is a 0% residual stenosis post intervention.   CARDIAC CATHETERIZATION  CARDIAC CATHETERIZATION 03/21/2021  Narrative   Severe calcified diffuse three-vessel coronary artery disease.  Right dominant anatomy.   Nonobstructive left main   Ostial 75% calcified LAD with tandem mid 80% and distal 80% stenoses.   Diffusely diseased circumflex with 75% proximal to mid first obtuse marginal.  Mid to distal circumflex 70% before small second obtuse marginal.   Dominant right coronary with previously placed proximal stent having diffuse 50% ISR, 80 to 90% segmental  mid stenosis (probable culprit), and continuation of segmental 60% stenosis to the distal segment where the artery opens up to a 3.0 diameter.  PDA and LV branch are large.   Normal LVEDP.  EF 65%.  RECOMMENDATIONS: Medical therapy versus CABG versus elevated risk RCA PCI.  Consider heart team approach after conversation with the patient and family about level of aggressiveness that they feel is appropriate. Aggressive risk factor modification and anti-ischemic therapy. If RCA PCI, would recommend femoral approach rather than radial for better catheter support during the intervention.  Findings Coronary Findings Diagnostic  Dominance: Right  Left Main Ost LM to Dist LM lesion is 35% stenosed.  Left Anterior Descending There is moderate diffuse disease throughout the vessel. Ost LAD to Prox LAD lesion is 75% stenosed. Mid LAD lesion is 80% stenosed. Mid LAD to Dist LAD lesion is 80% stenosed.  Left Circumflex There is moderate diffuse disease throughout the vessel. Mid Cx to Dist Cx lesion is 70% stenosed. Dist Cx lesion is 70% stenosed.  First Obtuse Marginal Branch 1st Mrg lesion is 80% stenosed.  Right Coronary Artery There is moderate diffuse disease throughout the vessel. Ost RCA to Prox RCA lesion is 50% stenosed. The lesion was previously treated . Prox RCA lesion is 85% stenosed. Mid RCA to Dist RCA lesion is 65% stenosed.  Intervention  No interventions have been documented.   STRESS TESTS  MYOCARDIAL PERFUSION IMAGING 08/25/2014   ECHOCARDIOGRAM  ECHOCARDIOGRAM COMPLETE 03/12/2023  Narrative ECHOCARDIOGRAM REPORT    Patient Name:   TREZURE ANDING Date of Exam: 03/12/2023 Medical Rec #:  [id number]    Height:       65.0 in Accession #:    [id number]   Weight:       118.4 lb Date of Birth:  1935/11/10    BSA:          1.583 m Patient Age:    87 years     BP:           120/69 mmHg Patient Gender: F            HR:           62 bpm. Exam Location:   Inpatient  Procedure: 2D Echo, Cardiac Doppler and Color Doppler  Indications:    CHF-Acute Diastolic I50.31  History:        Patient has prior history of Echocardiogram examinations, most recent 03/20/2021. Abnormal ECG and Pacemaker.  Sonographer:    Darlys Gales Referring Phys: Zannie Cove  IMPRESSIONS   1. Left ventricular ejection fraction, by estimation, is 55 to 60%. The left ventricle has normal function. The left ventricle has no regional wall motion abnormalities. There is mild asymmetric left ventricular hypertrophy of the basal-septal segment. Left ventricular diastolic parameters are indeterminate. 2. Right ventricular systolic function is mildly reduced. The right ventricular size is normal. There is mildly elevated pulmonary artery systolic pressure. The estimated right ventricular systolic pressure is 38.8 mmHg. 3. Left atrial size was severely dilated. 4. Right atrial size was moderately dilated. 5. The mitral valve is degenerative. Moderate mitral valve regurgitation. No evidence of mitral stenosis. Severe mitral annular calcification. 6. The aortic valve is abnormal. There is mild calcification of the aortic valve. There is moderate thickening of the aortic valve. Aortic valve regurgitation is mild. Aortic valve sclerosis/calcification is present, without any evidence of aortic stenosis. 7. The inferior vena cava is normal in size with greater than 50% respiratory variability, suggesting right atrial pressure of 3 mmHg.  FINDINGS Left Ventricle: Left ventricular ejection fraction, by estimation, is 55 to 60%. The left ventricle has normal function. The left ventricle has no regional wall motion abnormalities. The left ventricular internal cavity size was normal in size. There is mild asymmetric left ventricular hypertrophy of the basal-septal segment. Left ventricular diastolic function could not be evaluated due to mitral annular calcification (moderate or greater).  Left ventricular diastolic parameters are indeterminate.  Right Ventricle: The right ventricular size is normal. No increase in right ventricular wall thickness. Right ventricular systolic function is mildly reduced. There is mildly elevated  pulmonary artery systolic pressure. The tricuspid regurgitant velocity is 2.99 m/s, and with an assumed right atrial pressure of 3 mmHg, the estimated right ventricular systolic pressure is 38.8 mmHg.  Left Atrium: Left atrial size was severely dilated.  Right Atrium: Right atrial size was moderately dilated.  Pericardium: There is no evidence of pericardial effusion.  Mitral Valve: The mitral valve is degenerative in appearance. Severe mitral annular calcification. Moderate mitral valve regurgitation. No evidence of mitral valve stenosis.  Tricuspid Valve: The tricuspid valve is normal in structure. Tricuspid valve regurgitation is mild . No evidence of tricuspid stenosis.  Aortic Valve: The aortic valve is abnormal. There is mild calcification of the aortic valve. There is moderate thickening of the aortic valve. Aortic valve regurgitation is mild. Aortic regurgitation PHT measures 689 msec. Aortic valve sclerosis/calcification is present, without any evidence of aortic stenosis. Aortic valve mean gradient measures 3.0 mmHg. Aortic valve peak gradient measures 7.5 mmHg. Aortic valve area, by VTI measures 1.94 cm.  Pulmonic Valve: The pulmonic valve was normal in structure. Pulmonic valve regurgitation is mild to moderate. No evidence of pulmonic stenosis.  Aorta: The aortic root is normal in size and structure.  Venous: The inferior vena cava is normal in size with greater than 50% respiratory variability, suggesting right atrial pressure of 3 mmHg.  IAS/Shunts: No atrial level shunt detected by color flow Doppler.  Additional Comments: A device lead is visualized in the right atrium and right ventricle.   LEFT VENTRICLE PLAX 2D LVIDd:          4.70 cm   Diastology LVIDs:         3.90 cm   LV e' medial:    5.11 cm/s LV PW:         0.70 cm   LV E/e' medial:  17.8 LV IVS:        1.20 cm   LV e' lateral:   7.62 cm/s LVOT diam:     1.80 cm   LV E/e' lateral: 12.0 LV SV:         57 LV SV Index:   36 LVOT Area:     2.54 cm   RIGHT VENTRICLE RV S prime:     11.20 cm/s TAPSE (M-mode): 2.5 cm  LEFT ATRIUM           Index        RIGHT ATRIUM           Index LA Vol (A2C): 73.1 ml 46.18 ml/m  RA Area:     14.70 cm LA Vol (A4C): 93.9 ml 59.32 ml/m  RA Volume:   31.40 ml  19.84 ml/m AORTIC VALVE AV Area (Vmax):    1.79 cm AV Area (Vmean):   2.13 cm AV Area (VTI):     1.94 cm AV Vmax:           137.00 cm/s AV Vmean:          87.100 cm/s AV VTI:            0.294 m AV Peak Grad:      7.5 mmHg AV Mean Grad:      3.0 mmHg LVOT Vmax:         96.20 cm/s LVOT Vmean:        72.800 cm/s LVOT VTI:          0.224 m LVOT/AV VTI ratio: 0.76 AI PHT:            689 msec  AORTA Ao Root diam: 3.40 cm  MITRAL VALVE               TRICUSPID VALVE MV Area (PHT): 2.83 cm    TR Peak grad:   35.8 mmHg MV Decel Time: 268 msec    TR Vmax:        299.00 cm/s MV E velocity: 91.10 cm/s MV A velocity: 35.30 cm/s  SHUNTS MV E/A ratio:  2.58        Systemic VTI:  0.22 m Systemic Diam: 1.80 cm  Weston Brass MD Electronically signed by Weston Brass MD Signature Date/Time: 03/12/2023/2:46:38 PM    Final                  Assessment & Plan   NSTEMI  - suspect Type II MI, currently no plans for LHC, diet resumed - planned for 48 hours of heparin - No wall motion abnormalities on Echo - A pacing with 1st HB and prolonged Qtc, ranexa stopped - continue DAPT and Imdur   Acute on chronic with Preserved EF Moderate MR HTN - complicated by hypokalemia - planned for K repletion, BMP at 1500, if resolved repeat lasix 40 IV X1  COPD (chronic obstructive pulmonary disease) (HCC)  Chronic respiratory failure with hypoxia  - On chronic  O2- 2L    Paroxysmal Atrial fibrillation  With Tachy/brady s/p PPM - SR with rare A pacing -Currently in sinus rhythm, not on anticoagulation due to frequent falls and confusion. -Continue metoprolol - will resume home amiodarone as outpatient   For questions or updates, please contact [facility] Please consult [email or web address] for contact info under Cardiology/STEMI.      Riley Lam, MD FASE [facility] Cardiologist [facility]  [address] [place], [address] [place], [place] [address] [phone]  9:29 AM

## 2023-03-13 NOTE — Patient Instructions (Addendum)
Visit Information  Thank you for taking time to visit with me today. Please don't hesitate to contact me if I can be of assistance to you.   Following are the goals we discussed today:   Goals Addressed             This Visit's Progress    [facility] care coordination services   Not on track    Interventions Today    Flowsheet Row Most Recent Value  Chronic Disease   Chronic disease during today's visit Other  [Fall, pcp follow up, home safety, home support, SW services]  General Interventions   General Interventions Discussed/Reviewed General Interventions Reviewed, Level of Care, Doctor Visits, Community Resources  Doctor Visits Discussed/Reviewed Doctor Visits Reviewed, PCP  PCP/Specialist Visits Compliance with follow-up visit  Communication with Social Work  Level of Care Adult Daycare, Skilled Nursing Facility, Development worker, international aid, Assisted Living  Education Interventions   Education Provided Provided Education  [employer] SW services/Referral]  Provided Verbal Education On [employer], Mental Health/Coping with Illness  Mental Health Interventions   Mental Health Discussed/Reviewed Mental Health Reviewed, Coping Strategies, Refer to Social Work for resources  Refer to Social Work for resources regarding Adult Daycare, Assisted Living/Skilled Nursing Facility, Other  [? facility placement]  Pharmacy Interventions   Pharmacy Dicussed/Reviewed Pharmacy Topics Reviewed  Safety Interventions   Safety Discussed/Reviewed Safety Reviewed, Community education officer, Refer for community resources  Advanced Directive Interventions   Advanced Directives Discussed/Reviewed Advanced Directives Discussed, Advanced Care Planning, Guardianship  Guardianship Provide resources           COMPLETED: [facility]--Make and Keep All Appointments       Timeframe:  Short-Term Goal Priority:  Medium Start Date:    03/25/2021                         Expected End Date:   08/02/2021  Follow Up Date: 07/05/2021  Barriers: Health Behaviors Knowledge                        - ask family or friend for a ride - call to cancel if needed - keep a calendar with appointment dates    Why is this important?   Part of staying healthy is seeing the doctor for follow-up care.  If you forget your appointments, there are some things you can do to stay on track.    Notes:  11/2- Recent procedure and pt continue to attend all follow up appointments with the assistance from her niece April. RN able to verify Niece (April) will be providing pt transportation services to her cardiologist on tomorrow. 9/28-Reports caregiver April (niece) continue to help within the home with all ADL and provides transportation to all her medical appointments.No missed appointments. Will continue to monitor due to her recent medical issues.  8/22- Discussed with daughter Bonita Quin all pending appointments post d/c orders and verified pt has sufficient transportation to all her pending appointments. Pt's niece April is also a caregiver and assist the pt with her pill box refills and transporting pt to all her medical appointments. In addition to running needed errands.  CASE CLOSURE DUE TO EXTERNAL CASE MANAGEMENT PROGRAM     COMPLETED: [facility]--Matintain My Quality of Life       Nurse care plan type changed for year 2024 11/03/22 Closing this Goal - Duplicate goal information for 2024  L. Noelle Penner, RN, BSN, CCM [facility]  Care Management Community Coordinator Office number [phone]      Timeframe:  Long-Range Goal Priority:  Medium Start Date:  03/25/2021                           Expected End Date:    10/01/2020                   Follow Up Date: 07/05/2021    - complete a living will - do one enjoyable thing every day - make shared treatment decisions with doctor - name a health care proxy (decision maker) - spend time outdoors at least 3 times a week - strengthen or fix relationships  with loved ones  Barriers: Health Behaviors Knowledge    Why is this important?   Having a long-term illness can be scary.  It can also be stressful for you and your caregiver.  These steps may help.    Notes:  11/2-Reports she received the A.D and continues to completed with with family involvement. Continues to work on the relationship with her family (April and [place]). 8/22-Discussed healthy habits to improve quality of life and all goals with interventions noted above. Stress the importance of seek medical attention when needed with any acute symptoms of encounters. CASE CLOSURE DUE TO EXTERNAL CASE MANAGEMENT PROGRAM        Our next appointment is by telephone on 11/10/22 at 1:30 pm  Please call the care guide team at [phone] if you need to cancel or reschedule your appointment.   If you are experiencing a Mental Health or Behavioral Health Crisis or need someone to talk to, please call the Suicide and Crisis Lifeline: 988 call the [place] National Suicide Prevention Lifeline: (548)495-2114 or TTY: 779-208-3496 TTY 207-720-4469) to talk to a trained counselor call 1-800-273-TALK (toll free, 24 hour hotline) call the [facility]: [phone] call 911   Patient verbalizes understanding of instructions and care plan provided today and agrees to view in MyChart. Active MyChart status and patient understanding of how to access instructions and care plan via MyChart confirmed with patient.     The patient has been provided with contact information for the care management team and has been advised to call with any health related questions or concerns.    L. Noelle Penner, RN, BSN, CCM [facility] Care Management Community Coordinator Office number [phone]

## 2023-03-13 NOTE — Consult Note (Signed)
   [facility] CM Inpatient Consult   03/13/2023  ALEYA MALOOF 01/16/1936 [id number]  [facility] [[facility]]  Accountable Care Organization [ACO] Patient: [name]  Primary Care Provider:  [name], MD   Patient had active with [facility] [[facility]] Care Management for chronic disease management services.  Patient has been engaged by a [facility] LCSW and Airline pilot.  Our community based plan of care has focused on disease management and community resource support.    2;00 PM Met with patient at the bedside.  She was up in recliner she is very hard of hearing she state to speak in her left ear.  She spoke a great deal of time with this Clinical research associate about how she is in the "nursing home" because of her granddaughter.  She speaks about all of her medical issues and the cause of coming to the hospital.  She states she is better but still feel she has some fluid in her chest.  She speaks of her pancreatic cyst [one precancerous] and that she wasn't a candidate for surgery. Explained reason for rounding visit to check on her status with SNF.  She believes she is there long term.   Plan: Will alert RN Care Coordinator that patient is to return to SNF.  Of note, [facility] Care Management services does not replace or interfere with any services that are needed or arranged by inpatient [facility] care management team.   For additional questions or referrals please contact:  Charlesetta Shanks, RN BSN CCM [facility]  [phone] business mobile phone Toll free office [phone]  *Concierge Line  [phone] Fax number: [phone] [email or web address]

## 2023-03-13 NOTE — Patient Instructions (Signed)
Visit Information  Thank you for taking time to visit with me today. Please don't hesitate to contact me if I can be of assistance to you.   Following are the goals we discussed today:   Goals Addressed             This Visit's Progress    [facility] care coordination services   Not on track    Interventions Today    Flowsheet Row Most Recent Value  Chronic Disease   Chronic disease during today's visit Other  [Assessed voiced concerns for Increase confusion, home safety, level of care concerns]  General Interventions   General Interventions Discussed/Reviewed General Interventions Discussed, Doctor Visits, Level of Care, Community Resources  Doctor Visits Discussed/Reviewed Doctor Visits Discussed, PCP, Specialist  PCP/Specialist Visits Compliance with follow-up visit  Communication with Social Work  Level of Care Personal Care Services, Skilled Nursing Facility, Adult Daycare, Assisted Living  Education Interventions   Education Provided Provided Education  [Discussed neurology & psychology consults, discussed results of psych evaluation (competence), assessed support system & discussed general state next of kin guidelines, POA, reviewed involuntary committment process+]  Provided Verbal Education On [employer]  Mental Health Interventions   Mental Health Discussed/Reviewed Mental Health Discussed, Coping Strategies, Other  Safety Interventions   Safety Discussed/Reviewed Safety Discussed, Home Safety, Fall Risk  [April was encouraged to let the county officers and EMS staff guide her as needed to get emergency services. Provided [facility] 24 hour nurse line number plus RN CM availability & office number]  Home Safety Assistive Devices, Refer for community resources  [Reviewed a safety plan for patient with April, especially for this Easter holiday weekend to include scheduled call to the patient, If no answer afte 3 outreaches encourage her to call the [facility] no emergency  line as the officers encouraged]              Our next appointment is by telephone on 11/03/22 at 3 pm  Please call the care guide team at [phone] if you need to cancel or reschedule your appointment.   If you are experiencing a Mental Health or Behavioral Health Crisis or need someone to talk to, please call the Suicide and Crisis Lifeline: 988 call the [place] National Suicide Prevention Lifeline: 248-545-6080 or TTY: 579-846-0578 TTY 947-230-5203) to talk to a trained counselor call 1-800-273-TALK (toll free, 24 hour hotline) call the [facility]: 250-007-6425 call 911   Patient verbalizes understanding of instructions and care plan provided today and agrees to view in MyChart. Active MyChart status and patient understanding of how to access instructions and care plan via MyChart confirmed with patient.     The patient has been provided with contact information for the care management team and has been advised to call with any health related questions or concerns.    L. Noelle Penner, RN, BSN, CCM [facility] Care Management Community Coordinator Office number [phone]

## 2023-03-14 DIAGNOSIS — I214 Non-ST elevation (NSTEMI) myocardial infarction: Secondary | ICD-10-CM | POA: Diagnosis not present

## 2023-03-14 LAB — GLUCOSE, CAPILLARY
Glucose-Capillary: 98 mg/dL (ref 70–99)
Glucose-Capillary: 87 mg/dL (ref 70–99)
Glucose-Capillary: 92 mg/dL (ref 70–99)
Glucose-Capillary: 106 mg/dL — ABNORMAL HIGH (ref 70–99)

## 2023-03-14 MED ORDER — GUAIFENESIN 100 MG/5ML PO LIQD: 5.0000 mL | ORAL | Status: DC | PRN

## 2023-03-14 MED ORDER — POTASSIUM CHLORIDE 20 MEQ PO PACK
40.0000 meq | PACK | Freq: Two times a day (BID) | ORAL | Status: DC
  Administered 2023-03-14 – 2023-03-18 (×9): 40 meq via ORAL
  Filled 2023-03-14 (×9): qty 2

## 2023-03-14 MED ORDER — FUROSEMIDE 10 MG/ML IJ SOLN
40.0000 mg | Freq: Two times a day (BID) | INTRAMUSCULAR | Status: DC
  Administered 2023-03-14 (×2): 40 mg via INTRAVENOUS
  Filled 2023-03-14 (×2): qty 4

## 2023-03-14 MED ORDER — IPRATROPIUM-ALBUTEROL 0.5-2.5 (3) MG/3ML IN SOLN: 3.0000 mL | RESPIRATORY_TRACT | Status: DC | PRN

## 2023-03-14 MED ORDER — TRAZODONE HCL 50 MG PO TABS: 50.0000 mg | ORAL_TABLET | Freq: Every evening | ORAL | Status: DC | PRN

## 2023-03-14 MED ORDER — HYDRALAZINE HCL 20 MG/ML IJ SOLN
10.0000 mg | INTRAMUSCULAR | Status: DC | PRN
  Filled 2023-03-14: qty 1

## 2023-03-14 MED ORDER — METOPROLOL TARTRATE 5 MG/5ML IV SOLN: 5.0000 mg | INTRAVENOUS | Status: DC | PRN

## 2023-03-14 MED ORDER — SENNOSIDES-DOCUSATE SODIUM 8.6-50 MG PO TABS: 1.0000 | ORAL_TABLET | Freq: Every evening | ORAL | Status: DC | PRN

## 2023-03-14 NOTE — Progress Notes (Addendum)
PROGRESS NOTE    [name]  EXB:[id number] DOB: [birth year] DOA: 03/11/2023 PCP: [name], MD   Brief Narrative:   87 year old with history of paroxysmal A-fib status post pacemaker, CAD, diet CHF EF 55%, chronic hypoxia, COPD, CAD comes to the hospital with chest pain and diaphoresis.  Chest x-ray showed small pleural effusion.  Patient was transferred from [facility] to [facility] secondary to elevated troponin/NSTEMI and started on heparin drip.  Assessment & Plan:  Principal Problem:   NSTEMI (non-ST elevated myocardial infarction) (HCC) Active Problems:   COPD (chronic obstructive pulmonary disease) (HCC)   Chronic respiratory failure with hypoxia (HCC)   Chronic systolic heart failure (HCC)   Atrial fibrillation, chronic (HCC)   PPM-Medtronic   Essential hypertension   CAD (coronary artery disease)   CHF (congestive heart failure) (HCC)      NSTEMI  History of multivessel CAD status post complex PCI to RCA in 2022 -48 hours of heparin drip .  Echo shows no wall motion abnormality.  Continue aspirin, statin, metoprolol and Imdur    Acute on chronic diastolic CHF, EF 10% -Repeat echo with EF of 55%, mildly reduced RV, no wall motion abnormalities Volume status improved after receiving Lasix Cardiology following.    Severe hypokalemia -Replete as needed   Chronic respiratory failure with hypoxia (HCC) On chronic O2- 2L.  Stable, monitor   COPD (chronic obstructive pulmonary disease) (HCC) -Nebs.  And scheduled.   Atrial fibrillation, chronic (HCC) Pacemaker status.   In normal sinus rhythm.  Not on anticoagulation due to risk of falls.  Continue Toprol-XL.  Cardiology planning to resume amiodarone as outpatient   Essential hypertension Stable. -Resume metoprolol,   DVT prophylaxis: Heparin Code Status: DNR Family Communication: Called April, went to voicemail.  Disposition Plan: Back to SNF likely 1 to 2 days       Diet Orders (From  admission, onward)     Start     Ordered   03/13/23 1018  Diet Heart Room service appropriate? Yes with Assist; Fluid consistency: Thin  Diet effective now       Question Answer Comment  Room service appropriate? Yes with Assist   Fluid consistency: Thin      03/13/23 1017            Subjective: Doing ok, no new complaints  Overall weak   Examination:  General exam: Appears calm and comfortable; frail elderly.  Respiratory system: b/l diminished BS Cardiovascular system: S1 & S2 heard, RRR. No JVD, murmurs, rubs, gallops or clicks. No pedal edema. Gastrointestinal system: Abdomen is nondistended, soft and nontender. No organomegaly or masses felt. Normal bowel sounds heard. Central nervous system: Alert and oriented. No focal neurological deficits. Extremities: Symmetric 4 x 5 power. Skin: No rashes, lesions or ulcers Psychiatry: Judgement and insight appear normal. Mood & affect appropriate.  Objective: Vitals:   03/13/23 1923 03/13/23 2205 03/14/23 0013 03/14/23 0528  BP: (!) 89/55 107/63 101/61 (!) 120/50  Pulse: 61 (!) 110 (!) 59 98  Resp: 20  (!) 23 15  Temp: (!) 97.4 F (36.3 C)  97.8 F (36.6 C) 97.8 F (36.6 C)  TempSrc: Oral  Oral Oral  SpO2: 100%  100% 97%  Weight:   52.1 kg 53.6 kg  Height:        Intake/Output Summary (Last 24 hours) at 03/14/2023 0738 Last data filed at 03/14/2023 0500 Gross per 24 hour  Intake 475.2 ml  Output 1250 ml  Net -  774.8 ml   Filed Weights   03/13/23 0433 03/14/23 0013 03/14/23 0528  Weight: 53.8 kg 52.1 kg 53.6 kg    Scheduled Meds:  aspirin EC  81 mg Oral Daily   atorvastatin  80 mg Oral Daily   insulin aspart  0-5 Units Subcutaneous QHS   insulin aspart  0-9 Units Subcutaneous TID WC   isosorbide mononitrate  30 mg Oral Daily   metoprolol succinate  50 mg Oral BID   ticagrelor  60 mg Oral BID   Continuous Infusions:  Nutritional status     Body mass index is 19.66 kg/m.  Data Reviewed:    CBC: Recent Labs  Lab 03/11/23 1625 03/12/23 0731 03/13/23 0137 03/14/23 0130  WBC 8.4 8.9 8.3 6.8  HGB 12.1 11.0* 11.4* 11.5*  HCT 38.3 34.4* 36.1 35.9*  MCV 95.8 92.7 95.5 94.5  PLT 256 228 221 238   Basic Metabolic Panel: Recent Labs  Lab 03/11/23 1625 03/12/23 0537 03/13/23 0635 03/13/23 1520 03/14/23 0130  NA 139 139 137 136 137  K 4.7 3.6 2.7* 4.7 4.4  CL 101 102 96* 99 100  CO2 28 26 27 28 28   GLUCOSE 115* 79 94 129* 104*  BUN 11 13 18 16 16   CREATININE 0.72 1.04* 1.04* 1.02* 1.18*  CALCIUM 9.6 8.9 8.6* 9.1 9.0   GFR: Estimated Creatinine Clearance: 29.5 mL/min (A) (by C-G formula based on SCr of 1.18 mg/dL (H)). Liver Function Tests: Recent Labs  Lab 03/11/23 1625  AST 28  ALT 16  ALKPHOS 71  BILITOT 1.2  PROT 7.5  ALBUMIN 3.4*   Recent Labs  Lab 03/11/23 1625  LIPASE 30   No results for input(s): "AMMONIA" in the last 168 hours. Coagulation Profile: No results for input(s): "INR", "PROTIME" in the last 168 hours. Cardiac Enzymes: No results for input(s): "CKTOTAL", "CKMB", "CKMBINDEX", "TROPONINI" in the last 168 hours. BNP (last 3 results) No results for input(s): "PROBNP" in the last 8760 hours. HbA1C: No results for input(s): "HGBA1C" in the last 72 hours. CBG: Recent Labs  Lab 03/12/23 2055 03/13/23 0609 03/13/23 1057 03/13/23 1604 03/13/23 2055  GLUCAP 133* 92 74 112* 90   Lipid Profile: No results for input(s): "CHOL", "HDL", "LDLCALC", "TRIG", "CHOLHDL", "LDLDIRECT" in the last 72 hours. Thyroid Function Tests: No results for input(s): "TSH", "T4TOTAL", "FREET4", "T3FREE", "THYROIDAB" in the last 72 hours. Anemia Panel: No results for input(s): "VITAMINB12", "FOLATE", "FERRITIN", "TIBC", "IRON", "RETICCTPCT" in the last 72 hours. Sepsis Labs: No results for input(s): "PROCALCITON", "LATICACIDVEN" in the last 168 hours.  No results found for this or any previous visit (from the past 240 hour(s)).       Radiology  Studies: ECHOCARDIOGRAM COMPLETE  Result Date: 03/12/2023    ECHOCARDIOGRAM REPORT   Patient Name:   [name] Date of Exam: 03/12/2023 Medical Rec #:  [id number]    Height:       65.0 in Accession #:    [id number]   Weight:       118.4 lb Date of Birth:  1936-06-12    BSA:          1.583 m Patient Age:    87 years     BP:           120/69 mmHg Patient Gender: F            HR:           62 bpm. Exam Location:  Inpatient Procedure: 2D Echo, Cardiac  Doppler and Color Doppler Indications:    CHF-Acute Diastolic I50.31  History:        Patient has prior history of Echocardiogram examinations, most                 recent 03/20/2021. Abnormal ECG and Pacemaker.  Sonographer:    Darlys Gales Referring Phys: Zannie Cove IMPRESSIONS  1. Left ventricular ejection fraction, by estimation, is 55 to 60%. The left ventricle has normal function. The left ventricle has no regional wall motion abnormalities. There is mild asymmetric left ventricular hypertrophy of the basal-septal segment. Left ventricular diastolic parameters are indeterminate.  2. Right ventricular systolic function is mildly reduced. The right ventricular size is normal. There is mildly elevated pulmonary artery systolic pressure. The estimated right ventricular systolic pressure is 38.8 mmHg.  3. Left atrial size was severely dilated.  4. Right atrial size was moderately dilated.  5. The mitral valve is degenerative. Moderate mitral valve regurgitation. No evidence of mitral stenosis. Severe mitral annular calcification.  6. The aortic valve is abnormal. There is mild calcification of the aortic valve. There is moderate thickening of the aortic valve. Aortic valve regurgitation is mild. Aortic valve sclerosis/calcification is present, without any evidence of aortic stenosis.  7. The inferior vena cava is normal in size with greater than 50% respiratory variability, suggesting right atrial pressure of 3 mmHg. FINDINGS  Left Ventricle: Left ventricular  ejection fraction, by estimation, is 55 to 60%. The left ventricle has normal function. The left ventricle has no regional wall motion abnormalities. The left ventricular internal cavity size was normal in size. There is  mild asymmetric left ventricular hypertrophy of the basal-septal segment. Left ventricular diastolic function could not be evaluated due to mitral annular calcification (moderate or greater). Left ventricular diastolic parameters are indeterminate. Right Ventricle: The right ventricular size is normal. No increase in right ventricular wall thickness. Right ventricular systolic function is mildly reduced. There is mildly elevated pulmonary artery systolic pressure. The tricuspid regurgitant velocity  is 2.99 m/s, and with an assumed right atrial pressure of 3 mmHg, the estimated right ventricular systolic pressure is 38.8 mmHg. Left Atrium: Left atrial size was severely dilated. Right Atrium: Right atrial size was moderately dilated. Pericardium: There is no evidence of pericardial effusion. Mitral Valve: The mitral valve is degenerative in appearance. Severe mitral annular calcification. Moderate mitral valve regurgitation. No evidence of mitral valve stenosis. Tricuspid Valve: The tricuspid valve is normal in structure. Tricuspid valve regurgitation is mild . No evidence of tricuspid stenosis. Aortic Valve: The aortic valve is abnormal. There is mild calcification of the aortic valve. There is moderate thickening of the aortic valve. Aortic valve regurgitation is mild. Aortic regurgitation PHT measures 689 msec. Aortic valve sclerosis/calcification is present, without any evidence of aortic stenosis. Aortic valve mean gradient measures 3.0 mmHg. Aortic valve peak gradient measures 7.5 mmHg. Aortic valve area, by VTI measures 1.94 cm. Pulmonic Valve: The pulmonic valve was normal in structure. Pulmonic valve regurgitation is mild to moderate. No evidence of pulmonic stenosis. Aorta: The aortic  root is normal in size and structure. Venous: The inferior vena cava is normal in size with greater than 50% respiratory variability, suggesting right atrial pressure of 3 mmHg. IAS/Shunts: No atrial level shunt detected by color flow Doppler. Additional Comments: A device lead is visualized in the right atrium and right ventricle.  LEFT VENTRICLE PLAX 2D LVIDd:         4.70 cm   Diastology LVIDs:  3.90 cm   LV e' medial:    5.11 cm/s LV PW:         0.70 cm   LV E/e' medial:  17.8 LV IVS:        1.20 cm   LV e' lateral:   7.62 cm/s LVOT diam:     1.80 cm   LV E/e' lateral: 12.0 LV SV:         57 LV SV Index:   36 LVOT Area:     2.54 cm  RIGHT VENTRICLE RV S prime:     11.20 cm/s TAPSE (M-mode): 2.5 cm LEFT ATRIUM           Index        RIGHT ATRIUM           Index LA Vol (A2C): 73.1 ml 46.18 ml/m  RA Area:     14.70 cm LA Vol (A4C): 93.9 ml 59.32 ml/m  RA Volume:   31.40 ml  19.84 ml/m  AORTIC VALVE AV Area (Vmax):    1.79 cm AV Area (Vmean):   2.13 cm AV Area (VTI):     1.94 cm AV Vmax:           137.00 cm/s AV Vmean:          87.100 cm/s AV VTI:            0.294 m AV Peak Grad:      7.5 mmHg AV Mean Grad:      3.0 mmHg LVOT Vmax:         96.20 cm/s LVOT Vmean:        72.800 cm/s LVOT VTI:          0.224 m LVOT/AV VTI ratio: 0.76 AI PHT:            689 msec  AORTA Ao Root diam: 3.40 cm MITRAL VALVE               TRICUSPID VALVE MV Area (PHT): 2.83 cm    TR Peak grad:   35.8 mmHg MV Decel Time: 268 msec    TR Vmax:        299.00 cm/s MV E velocity: 91.10 cm/s MV A velocity: 35.30 cm/s  SHUNTS MV E/A ratio:  2.58        Systemic VTI:  0.22 m                            Systemic Diam: 1.80 cm Weston Brass MD Electronically signed by Weston Brass MD Signature Date/Time: 03/12/2023/2:46:38 PM    Final            LOS: 2 days   Time spent= 35 mins     Joline Maxcy, MD [facility]  If 7PM-7AM, please contact night-coverage  03/14/2023, 7:38 AM

## 2023-03-14 NOTE — TOC Initial Note (Addendum)
Transition of Care [facility]) - Initial/Assessment Note    Patient Details  Name: [name] MRN: [id number] Date of Birth: [birth year]  Transition of Care [facility]) CM/SW Contact:    Carmina Miller, LCSWA Phone Number: 03/14/2023, 8:47 AM  Clinical Narrative:                 Update-received return call from April, she is ok with pt returning to [facility] for STR. April requested MD call for medical update, CSW notified MD.  Pt is being recommended for STR, from [facility]. CSW attempted to reach pt's LG April, had to leave a vm. CSW spoke with pt's daughter Bonita Quin, she states April is the decision maker and defers to her. CSW spoke with Debbie-AD, she states pt can come for STR if April chooses, she states she spoke with April last week and believes April will choose [facility]. Pt has been worked up, referral sent out to [facility]. Insurance Berkley Harvey will be started when pt is closer to being medically clear.        Patient Goals and CMS Choice            Expected Discharge Plan and Services                                              Prior Living Arrangements/Services                       Activities of Daily Living Home Assistive Devices/Equipment: None ADL Screening (condition at time of admission) Patient's cognitive ability adequate to safely complete daily activities?: Yes Is the patient deaf or have difficulty hearing?: No Does the patient have difficulty seeing, even when wearing glasses/contacts?: No Does the patient have difficulty concentrating, remembering, or making decisions?: No Patient able to express need for assistance with ADLs?: Yes Does the patient have difficulty dressing or bathing?: No Independently performs ADLs?: Yes (appropriate for developmental age) Does the patient have difficulty walking or climbing stairs?: No Weakness of Legs: Both Weakness of Arms/Hands: Both  Permission Sought/Granted                  Emotional Assessment               Admission diagnosis:  Shortness of breath [R06.02] Nausea [R11.0] Elevated troponin [R79.89] NSTEMI (non-ST elevated myocardial infarction) (HCC) [I21.4] Chest pain, unspecified type [R07.9] Acute on chronic congestive heart failure, unspecified heart failure type (HCC) [I50.9] CHF (congestive heart failure) (HCC) [I50.9] Patient Active Problem List   Diagnosis Date Noted   CHF (congestive heart failure) (HCC) 03/12/2023   NSTEMI (non-ST elevated myocardial infarction) (HCC) 03/11/2023   Dehydration 11/25/2022   Failure to thrive in adult 11/25/2022   Acute metabolic encephalopathy 11/06/2022   Frequent falls 11/06/2022   Right sided weakness 11/05/2022   Paranoid delusion (HCC) 10/28/2022   Current moderate episode of major depressive disorder (HCC) 10/28/2022   Community acquired pneumonia 10/27/2022   Hardening of the aorta (main artery of the heart) (HCC) 10/27/2022   Pruritic rash 10/27/2022   Generalized abdominal pain 09/29/2022   COPD (chronic obstructive pulmonary disease) (HCC) 06/29/2022   Chronic respiratory failure with hypoxia (HCC) 06/29/2022   Chronic systolic heart failure (HCC) 06/29/2022   IPMN (intraductal papillary mucinous neoplasm) 06/09/2022   Constipation 06/09/2022   Atherosclerosis of coronary artery  without angina pectoris 04/23/2022   Abdominal pain, chronic, right upper quadrant 12/03/2021   Hearing difficulty 06/19/2021   Neck pain 05/16/2021   CAD (coronary artery disease) 05/02/2021   Nausea without vomiting 04/17/2021   Productive cough 04/17/2021   Chest pain 03/30/2021   COVID-19 virus infection 03/30/2021   Non-STEMI (non-ST elevated myocardial infarction) [facility])    Mass of pancreas 03/20/2021   Hypertensive urgency 03/19/2021   Elevated troponin 03/19/2021   Edema of lower extremity 01/14/2021   Impaired fasting glucose 01/07/2021   Generalized anxiety disorder 01/07/2021   Essential tremor 01/07/2021   Proteinuria  01/07/2021   Essential hypertension 01/02/2021   Iron deficiency anemia 01/02/2021   Prediabetes 01/02/2021   Difficulty swallowing 07/03/2020   Nonspecific chest pain 09/15/2016   Complete rotator cuff tear of left shoulder    Anemia 10/31/2014   H/O heart artery stent 10/30/2014   Chest pain with moderate risk for cardiac etiology 08/23/2014   Diastolic dysfunction with chronic heart failure (HCC) 08/23/2014   Chronic diastolic heart failure (HCC) 08/23/2014   Abdominal pain 08/23/2014   Acquired obstruction of both nasolacrimal ducts 06/15/2014   Lower abdominal pain 01/27/2012   Hypokalemia 06/03/2011   PERICARDIAL EFFUSION 07/10/2009   Disease of pericardium 07/10/2009   Essential hypertension, benign 04/18/2009   Triple vessel disease of the heart 04/18/2009   Benign essential hypertension 04/18/2009   PPM-Medtronic 04/04/2009   Sinus node dysfunction (HCC) 01/26/2009   HLD (hyperlipidemia) 12/19/2008   Anxiety 12/19/2008   Persistent atrial fibrillation (HCC) 12/19/2008   GERD (gastroesophageal reflux disease) 12/19/2008   Abnormal involuntary movements 12/19/2008   Atrial fibrillation, chronic (HCC) 12/19/2008   Mixed hyperlipidemia 12/19/2008   PCP:  [name], MD Pharmacy:   [facility] - [place], [place] - [phone] [place] [address] Phone: [phone] Fax: [phone]     Social Determinants of Health (SDOH) Social History: SDOH Screenings   Food Insecurity: No Food Insecurity (03/12/2023)  Housing: Low Risk  (03/12/2023)  Transportation Needs: No Transportation Needs (03/12/2023)  Utilities: Not At Risk (03/12/2023)  Alcohol Screen: Low Risk  (11/05/2022)  Depression (PHQ2-9): Low Risk  (11/05/2022)  Financial Resource Strain: Low Risk  (11/05/2022)  Physical Activity: Inactive (11/05/2022)  Social Connections: Moderately Integrated (11/05/2022)  Stress: No Stress Concern Present (11/05/2022)  Tobacco Use: Medium Risk  (03/11/2023)   SDOH Interventions:     Readmission Risk Interventions    07/02/2022   12:34 PM  Readmission Risk Prevention Plan  Transportation Screening Complete  PCP or Specialist Appt within 5-7 Days Complete  Home Care Screening Complete  Medication Review (RN CM) Complete

## 2023-03-14 NOTE — Progress Notes (Signed)
Progress Note  Patient Name: [name] Date of Encounter: 03/14/2023 Primary Cardiologist: Nona Dell, MD   Subjective   Low BP over night with low temp. Patient notes no CP.  Persistent SOB.  She is worried that her family only cares about her being alive because they get a check from her.  Vital Signs    Vitals:   03/13/23 1923 03/13/23 2205 03/14/23 0013 03/14/23 0528  BP: (!) 89/55 107/63 101/61 (!) 120/50  Pulse: 61 (!) 110 (!) 59 98  Resp: 20  (!) 23 15  Temp: (!) 97.4 F (36.3 C)  97.8 F (36.6 C) 97.8 F (36.6 C)  TempSrc: Oral  Oral Oral  SpO2: 100%  100% 97%  Weight:   52.1 kg 53.6 kg  Height:        Intake/Output Summary (Last 24 hours) at 03/14/2023 0713 Last data filed at 03/14/2023 0500 Gross per 24 hour  Intake 475.2 ml  Output 1250 ml  Net -774.8 ml   Filed Weights   03/13/23 0433 03/14/23 0013 03/14/23 0528  Weight: 53.8 kg 52.1 kg 53.6 kg    Physical Exam   GEN: No acute distress.   Neck: No JVD Cardiac: RRR, systolic murmur, no rubs, or gallops.  Respiratory: Decreased breath sounds GI: Soft, nontender, non-distended  MS: Non pitting edema  Labs   Telemetry:  A pacing and SR   Chemistry Recent Labs  Lab 03/11/23 1625 03/12/23 0537 03/13/23 0635 03/13/23 1520 03/14/23 0130  NA 139   < > 137 136 137  K 4.7   < > 2.7* 4.7 4.4  CL 101   < > 96* 99 100  CO2 28   < > 27 28 28   GLUCOSE 115*   < > 94 129* 104*  BUN 11   < > 18 16 16   CREATININE 0.72   < > 1.04* 1.02* 1.18*  CALCIUM 9.6   < > 8.6* 9.1 9.0  PROT 7.5  --   --   --   --   ALBUMIN 3.4*  --   --   --   --   AST 28  --   --   --   --   ALT 16  --   --   --   --   ALKPHOS 71  --   --   --   --   BILITOT 1.2  --   --   --   --   GFRNONAA >60   < > 53* 54* 45*  ANIONGAP 10   < > 14 9 9    < > = values in this interval not displayed.     Hematology Recent Labs  Lab 03/12/23 0731 03/13/23 0137 03/14/23 0130  WBC 8.9 8.3 6.8  RBC 3.71* 3.78* 3.80*  HGB  11.0* 11.4* 11.5*  HCT 34.4* 36.1 35.9*  MCV 92.7 95.5 94.5  MCH 29.6 30.2 30.3  MCHC 32.0 31.6 32.0  RDW 18.7* 18.8* 18.7*  PLT 228 221 238    Cardiac EnzymesNo results for input(s): "TROPONINI" in the last 168 hours. No results for input(s): "TROPIPOC" in the last 168 hours.   BNP Recent Labs  Lab 03/11/23 1625  BNP 1,151.0*     DDimer No results for input(s): "DDIMER" in the last 168 hours.   Cardiac Studies   Cardiac Studies & Procedures   CARDIAC CATHETERIZATION  CARDIAC CATHETERIZATION 05/28/2021  Narrative   ---------- IVUS-guided PTCA/PCI: Combination SHOCKWAVE LITHOTRIPSY, & SCOREFLEX  ANGIOPLASTY with GUIDELINER support-----------   LESION #1 prox RCA lesion is 85% stenosed distal to previous stent.   Scoring balloon angioplasty was performed following SHOCKWAVE LITHOTRIPSY (3.0 x 12) BALLN (2 runs of 10 pulses) using a BALLN SCOREFLEX 3.0X15.   Lesion #2 mid RCA to Dist RCA lesion is 65% stenosed.   Scoring balloon angioplasty was performed using a BALLN SCOREFLEX 3.0X15.  With Use of GuideLiner Catheter Extension   A drug-eluting stent was successfully placed covering both LESIONS #1 and #2 (overlapping previous stent) using a SYNERGY XD 3.0X48.  Postdilated to 3.6 mm   Post intervention, there is a 0% residual stenosis in both these LESIONS #1 and #2.Marland Kitchen   LESION #3 Ost RCA to Prox RCA previously placed stents are 70% stenosed.   Scoring balloon angioplasty was performed using a BALLN SCOREFLEX 3.0X15 -> followed by post dilation with 3.5 mm x 15 mm [place] balloon. ->  Achieved 3.5 mm.   Post intervention, there is a 10% residual in-stent restenosis.   ---------------------------------   Lesion #4 Ost RCA lesion is 75% stenosed.   A drug-eluting stent was successfully placed at the ostium and overlapping previous stent proximally, using a SYNERGY XD 3.50X12.  Postdilated to 3.65 mm.   Post intervention, there is a 0% residual stenosis.   Post intervention, there is a  0% residual stenosis with exception of the 10% residual ISR in the previously placed stents which are likely undersized 2.75 mm stents.  SUMMARY Successful Complext DES PCI of Ostial & mid to Distal RCA using Shockwave Lithotrypsy and ScoreFlex Scoring Balloon Angioplasty with Guideliner Extension Catheter. Mid-Distal RCA tandem lesions: Finally treated with distally overlapping SYNERGY XD (DES) 3.0X48 mm postdilated to 3.6 mm High-pressure score flex angioplasty followed by post dilation of previously placed ostial and proximal RCA stent dilating up to 3.5 mm-residual 10% Ostial RCA PCI with SYNERGY XD 3.5X12. ->  Deployed at 3.65 mm. --> All lesions segments reduced to 0% with exception of the in-stent restenosis portion which was 10%. --> TIMI-3 flow pre and post.  Findings Coronary Findings Diagnostic  Dominance: Right  Left Main Vessel was not injected. Ost LM to Dist LM lesion is 35% stenosed.  Left Anterior Descending There is moderate diffuse disease throughout the vessel. Ost LAD to Prox LAD lesion is 75% stenosed. Mid LAD lesion is 80% stenosed. Mid LAD to Dist LAD lesion is 80% stenosed.  Left Circumflex There is moderate diffuse disease throughout the vessel. Mid Cx to Dist Cx lesion is 70% stenosed. Dist Cx lesion is 70% stenosed.  First Obtuse Marginal Branch 1st Mrg lesion is 80% stenosed.  Right Coronary Artery Vessel is large. There is moderate diffuse disease throughout the vessel. Ost RCA lesion is 75% stenosed. The lesion is eccentric. Potentially related to guide catheter Ost RCA to Prox RCA lesion is 70% stenosed. The lesion was previously treated using a drug eluting stent over 2 years ago. Previously placed stent displays restenosis. Ultrasound (IVUS) was performed. Minimum lumen diameter: 2 mm. Severe plaque burden was detected. IVUS has determined that the lesion is fibrinous and heterogeneous. Prox RCA lesion is 85% stenosed. Mid RCA to Dist RCA lesion  is 65% stenosed.  Right Ventricular Branch Vessel is small in size.  Intervention  Ost RCA lesion Stent Lesion length:  8 mm. CATH LAUNCHER 6FR AL.75 guide catheter was inserted. Lesion crossed with guidewire using a WIRE ASAHI PROWATER 180CM. Pre-stent angioplasty was not performed. A drug-eluting stent was successfully placed using  a SYNERGY XD [id number]. Maximum pressure: 18 atm. Inflation time: 20 sec. Stent strut is well apposed. Postdilated with stent balloon to 3.65 mm Stent overlaps previously placed stent. Post-stent angioplasty was performed. Maximum pressure:  20 atm. Inflation time: 20 sec. Stent balloon, high ATM Stent (Also treats lesions: Ost RCA to Prox RCA, Prox RCA, and Mid RCA to Dist RCA) Lesion length:  40 mm. Lesion crossed with guidewire using a WIRE ASAHI PROWATER 180CM. Pre-stent angioplasty was performed using a BALLN SAPPHIRE 2.0X12. Maximum pressure:  12 atm. Inflation time:  20 sec. Initial PTCA followed by Shockwave &amp; ScoreFlex A drug-eluting stent was successfully placed using a SYNERGY XD 3.0X48. Maximum pressure: 18 atm. Inflation time: 20 sec. Stent strut is well apposed. Postdilated to 3.6 mm Stent overlaps previously placed stent. Post-stent angioplasty was performed using a BALL SAPPHIRE NC24 3.5X26. Maximum pressure:  18 atm. Inflation time:  10 sec. GuideLiner catheter was used to advance the Scoreflex balloon to the distal part of the lesion, and then also to place the stent distally. Post-Intervention Lesion Assessment The intervention was successful. Pre-interventional TIMI flow is 3. Post-intervention TIMI flow is 3. Treated lesion length:  8 mm. No complications occurred at this lesion. There is a 0% residual stenosis post intervention.  Ost RCA to Prox RCA lesion Angioplasty Lesion length:  28 mm. CATHETER SHOCKWAVE 3.0X12 guide catheter was inserted. WIRE ASAHI PROWATER 180CM guidewire used to cross lesion. Scoring balloon angioplasty was performed  using a BALLN SCOREFLEX 3.0X15. Maximum pressure: 16 atm. Inflation time: 20 sec. Stent (Also treats lesions: Ost RCA, Prox RCA, and Mid RCA to Dist RCA) See details in Ost RCA lesion. Post-Intervention Lesion Assessment The intervention was successful. Pre-interventional TIMI flow is 3. Post-intervention TIMI flow is 3. Treated lesion length:  28 mm. No complications occurred at this lesion. There is a 10% residual stenosis post intervention.  Prox RCA lesion Angioplasty Lesion length:  44 mm. CATHETER SHOCKWAVE 3.0X12 guide catheter was inserted. WIRE ASAHI PROWATER 180CM guidewire used to cross lesion. Scoring balloon angioplasty was performed using a BALLN SCOREFLEX 3.0X15. BALLN SAPPHIRE 2.0X12 3.0 mm  x 12 mm shockwave lithotripsy balloon -&gt; 2 cycles of 10 pulses 3.0 mm x 15 mm core flex balloon at this lesion and several other lesions downstream.. Maximum pressure: 14 atm. Inflation time: 15 sec. Stent (Also treats lesions: Ost RCA, Ost RCA to Prox RCA, and Mid RCA to Dist RCA) See details in Ost RCA lesion. Post-Intervention Lesion Assessment The intervention was successful. Pre-interventional TIMI flow is 3. Post-intervention TIMI flow is 3. Treated lesion length:  44 mm. No complications occurred at this lesion. There is a 0% residual stenosis post intervention.  Mid RCA to Dist RCA lesion Stent (Also treats lesions: Ost RCA, Ost RCA to Prox RCA, and Prox RCA) See details in Ost RCA lesion. Post-Intervention Lesion Assessment The intervention was successful. Pre-interventional TIMI flow is 3. Post-intervention TIMI flow is 3. No complications occurred at this lesion. There is a 0% residual stenosis post intervention.   CARDIAC CATHETERIZATION  CARDIAC CATHETERIZATION 03/21/2021  Narrative   Severe calcified diffuse three-vessel coronary artery disease.  Right dominant anatomy.   Nonobstructive left main   Ostial 75% calcified LAD with tandem mid 80% and distal 80%  stenoses.   Diffusely diseased circumflex with 75% proximal to mid first obtuse marginal.  Mid to distal circumflex 70% before small second obtuse marginal.   Dominant right coronary with previously placed proximal stent having diffuse 50% ISR,  80 to 90% segmental mid stenosis (probable culprit), and continuation of segmental 60% stenosis to the distal segment where the artery opens up to a 3.0 diameter.  PDA and LV branch are large.   Normal LVEDP.  EF 65%.  RECOMMENDATIONS: Medical therapy versus CABG versus elevated risk RCA PCI.  Consider heart team approach after conversation with the patient and family about level of aggressiveness that they feel is appropriate. Aggressive risk factor modification and anti-ischemic therapy. If RCA PCI, would recommend femoral approach rather than radial for better catheter support during the intervention.  Findings Coronary Findings Diagnostic  Dominance: Right  Left Main Ost LM to Dist LM lesion is 35% stenosed.  Left Anterior Descending There is moderate diffuse disease throughout the vessel. Ost LAD to Prox LAD lesion is 75% stenosed. Mid LAD lesion is 80% stenosed. Mid LAD to Dist LAD lesion is 80% stenosed.  Left Circumflex There is moderate diffuse disease throughout the vessel. Mid Cx to Dist Cx lesion is 70% stenosed. Dist Cx lesion is 70% stenosed.  First Obtuse Marginal Branch 1st Mrg lesion is 80% stenosed.  Right Coronary Artery There is moderate diffuse disease throughout the vessel. Ost RCA to Prox RCA lesion is 50% stenosed. The lesion was previously treated . Prox RCA lesion is 85% stenosed. Mid RCA to Dist RCA lesion is 65% stenosed.  Intervention  No interventions have been documented.   STRESS TESTS  MYOCARDIAL PERFUSION IMAGING 08/25/2014   ECHOCARDIOGRAM  ECHOCARDIOGRAM COMPLETE 03/12/2023  Narrative ECHOCARDIOGRAM REPORT    Patient Name:   FLORABELL VIOLETT Date of Exam: 03/12/2023 Medical Rec #:  [id number]     Height:       65.0 in Accession #:    [id number]   Weight:       118.4 lb Date of Birth:  May 24, 1936    BSA:          1.583 m Patient Age:    87 years     BP:           120/69 mmHg Patient Gender: F            HR:           62 bpm. Exam Location:  Inpatient  Procedure: 2D Echo, Cardiac Doppler and Color Doppler  Indications:    CHF-Acute Diastolic I50.31  History:        Patient has prior history of Echocardiogram examinations, most recent 03/20/2021. Abnormal ECG and Pacemaker.  Sonographer:    Darlys Gales Referring Phys: Zannie Cove  IMPRESSIONS   1. Left ventricular ejection fraction, by estimation, is 55 to 60%. The left ventricle has normal function. The left ventricle has no regional wall motion abnormalities. There is mild asymmetric left ventricular hypertrophy of the basal-septal segment. Left ventricular diastolic parameters are indeterminate. 2. Right ventricular systolic function is mildly reduced. The right ventricular size is normal. There is mildly elevated pulmonary artery systolic pressure. The estimated right ventricular systolic pressure is 38.8 mmHg. 3. Left atrial size was severely dilated. 4. Right atrial size was moderately dilated. 5. The mitral valve is degenerative. Moderate mitral valve regurgitation. No evidence of mitral stenosis. Severe mitral annular calcification. 6. The aortic valve is abnormal. There is mild calcification of the aortic valve. There is moderate thickening of the aortic valve. Aortic valve regurgitation is mild. Aortic valve sclerosis/calcification is present, without any evidence of aortic stenosis. 7. The inferior vena cava is normal in size with greater than 50% respiratory variability, suggesting  right atrial pressure of 3 mmHg.  FINDINGS Left Ventricle: Left ventricular ejection fraction, by estimation, is 55 to 60%. The left ventricle has normal function. The left ventricle has no regional wall motion abnormalities. The left  ventricular internal cavity size was normal in size. There is mild asymmetric left ventricular hypertrophy of the basal-septal segment. Left ventricular diastolic function could not be evaluated due to mitral annular calcification (moderate or greater). Left ventricular diastolic parameters are indeterminate.  Right Ventricle: The right ventricular size is normal. No increase in right ventricular wall thickness. Right ventricular systolic function is mildly reduced. There is mildly elevated pulmonary artery systolic pressure. The tricuspid regurgitant velocity is 2.99 m/s, and with an assumed right atrial pressure of 3 mmHg, the estimated right ventricular systolic pressure is 38.8 mmHg.  Left Atrium: Left atrial size was severely dilated.  Right Atrium: Right atrial size was moderately dilated.  Pericardium: There is no evidence of pericardial effusion.  Mitral Valve: The mitral valve is degenerative in appearance. Severe mitral annular calcification. Moderate mitral valve regurgitation. No evidence of mitral valve stenosis.  Tricuspid Valve: The tricuspid valve is normal in structure. Tricuspid valve regurgitation is mild . No evidence of tricuspid stenosis.  Aortic Valve: The aortic valve is abnormal. There is mild calcification of the aortic valve. There is moderate thickening of the aortic valve. Aortic valve regurgitation is mild. Aortic regurgitation PHT measures 689 msec. Aortic valve sclerosis/calcification is present, without any evidence of aortic stenosis. Aortic valve mean gradient measures 3.0 mmHg. Aortic valve peak gradient measures 7.5 mmHg. Aortic valve area, by VTI measures 1.94 cm.  Pulmonic Valve: The pulmonic valve was normal in structure. Pulmonic valve regurgitation is mild to moderate. No evidence of pulmonic stenosis.  Aorta: The aortic root is normal in size and structure.  Venous: The inferior vena cava is normal in size with greater than 50% respiratory  variability, suggesting right atrial pressure of 3 mmHg.  IAS/Shunts: No atrial level shunt detected by color flow Doppler.  Additional Comments: A device lead is visualized in the right atrium and right ventricle.   LEFT VENTRICLE PLAX 2D LVIDd:         4.70 cm   Diastology LVIDs:         3.90 cm   LV e' medial:    5.11 cm/s LV PW:         0.70 cm   LV E/e' medial:  17.8 LV IVS:        1.20 cm   LV e' lateral:   7.62 cm/s LVOT diam:     1.80 cm   LV E/e' lateral: 12.0 LV SV:         57 LV SV Index:   36 LVOT Area:     2.54 cm   RIGHT VENTRICLE RV S prime:     11.20 cm/s TAPSE (M-mode): 2.5 cm  LEFT ATRIUM           Index        RIGHT ATRIUM           Index LA Vol (A2C): 73.1 ml 46.18 ml/m  RA Area:     14.70 cm LA Vol (A4C): 93.9 ml 59.32 ml/m  RA Volume:   31.40 ml  19.84 ml/m AORTIC VALVE AV Area (Vmax):    1.79 cm AV Area (Vmean):   2.13 cm AV Area (VTI):     1.94 cm AV Vmax:  137.00 cm/s AV Vmean:          87.100 cm/s AV VTI:            0.294 m AV Peak Grad:      7.5 mmHg AV Mean Grad:      3.0 mmHg LVOT Vmax:         96.20 cm/s LVOT Vmean:        72.800 cm/s LVOT VTI:          0.224 m LVOT/AV VTI ratio: 0.76 AI PHT:            689 msec  AORTA Ao Root diam: 3.40 cm  MITRAL VALVE               TRICUSPID VALVE MV Area (PHT): 2.83 cm    TR Peak grad:   35.8 mmHg MV Decel Time: 268 msec    TR Vmax:        299.00 cm/s MV E velocity: 91.10 cm/s MV A velocity: 35.30 cm/s  SHUNTS MV E/A ratio:  2.58        Systemic VTI:  0.22 m Systemic Diam: 1.80 cm  Weston Brass MD Electronically signed by Weston Brass MD Signature Date/Time: 03/12/2023/2:46:38 PM    Final                  Assessment & Plan    Acute on chronic with Preserved EF Moderate MR HTN - K has resolved, will get 40 IV LASiX BID and K - BNP tomorrow  NSTEMI  - suspect Type II MI, currently no plans for LHC - transitioning off of AC today. - No wall motion  abnormalities on Echo - A pacing with 1st HB and prolonged Qtc, ranexa stopped - continue DAPT and Imdur   COPD (chronic obstructive pulmonary disease) (HCC)  Chronic respiratory failure with hypoxia  - On chronic O2- 2L    Paroxysmal Atrial fibrillation  With Tachy/brady s/p PPM - SR with rare A pacing -Currently in sinus rhythm, not on anticoagulation due to frequent falls and confusion. -Continue metoprolol - will resume home amiodarone as outpatient (200 mg PO Daily)  For questions or updates, please contact [facility] Please consult [email or web address] for contact info under Cardiology/STEMI.      Riley Lam, MD FASE [facility] Cardiologist [facility]  [address] [place], [address] [place], [place] [address] [phone]  7:13 AM

## 2023-03-14 NOTE — [place] FL2 (Signed)
Home MEDICAID FL2 LEVEL OF CARE FORM     IDENTIFICATION  Patient Name: [name] Birthdate: 03/24/1936 Sex: female Admission Date (Current Location): 03/11/2023  [facility] and [place] Number:  [employer] and Address:  [facility], [address], [place], [place] [address]      Provider Number: [id number]  Attending Physician Name and Address:  Dimple Nanas, MD  Relative Name and Phone Number:  [name] [phone]    Current Level of Care: Hospital Recommended Level of Care: Skilled Nursing Facility Prior Approval Number:    Date Approved/Denied:   PASRR Number: [id number]  Discharge Plan: SNF    Current Diagnoses: Patient Active Problem List   Diagnosis Date Noted   CHF (congestive heart failure) (HCC) 03/12/2023   NSTEMI (non-ST elevated myocardial infarction) (HCC) 03/11/2023   Dehydration 11/25/2022   Failure to thrive in adult 11/25/2022   Acute metabolic encephalopathy 11/06/2022   Frequent falls 11/06/2022   Right sided weakness 11/05/2022   Paranoid delusion (HCC) 10/28/2022   Current moderate episode of major depressive disorder (HCC) 10/28/2022   Community acquired pneumonia 10/27/2022   Hardening of the aorta (main artery of the heart) (HCC) 10/27/2022   Pruritic rash 10/27/2022   Generalized abdominal pain 09/29/2022   COPD (chronic obstructive pulmonary disease) (HCC) 06/29/2022   Chronic respiratory failure with hypoxia (HCC) 06/29/2022   Chronic systolic heart failure (HCC) 06/29/2022   IPMN (intraductal papillary mucinous neoplasm) 06/09/2022   Constipation 06/09/2022   Atherosclerosis of coronary artery without angina pectoris 04/23/2022   Abdominal pain, chronic, right upper quadrant 12/03/2021   Hearing difficulty 06/19/2021   Neck pain 05/16/2021   CAD (coronary artery disease) 05/02/2021   Nausea without vomiting 04/17/2021   Productive cough 04/17/2021   Chest pain 03/30/2021    COVID-19 virus infection 03/30/2021   Non-STEMI (non-ST elevated myocardial infarction) (HCC)    Mass of pancreas 03/20/2021   Hypertensive urgency 03/19/2021   Elevated troponin 03/19/2021   Edema of lower extremity 01/14/2021   Impaired fasting glucose 01/07/2021   Generalized anxiety disorder 01/07/2021   Essential tremor 01/07/2021   Proteinuria 01/07/2021   Essential hypertension 01/02/2021   Iron deficiency anemia 01/02/2021   Prediabetes 01/02/2021   Difficulty swallowing 07/03/2020   Nonspecific chest pain 09/15/2016   Complete rotator cuff tear of left shoulder    Anemia 10/31/2014   H/O heart artery stent 10/30/2014   Chest pain with moderate risk for cardiac etiology 08/23/2014   Diastolic dysfunction with chronic heart failure (HCC) 08/23/2014   Chronic diastolic heart failure (HCC) 08/23/2014   Abdominal pain 08/23/2014   Acquired obstruction of both nasolacrimal ducts 06/15/2014   Lower abdominal pain 01/27/2012   Hypokalemia 06/03/2011   PERICARDIAL EFFUSION 07/10/2009   Disease of pericardium 07/10/2009   Essential hypertension, benign 04/18/2009   Triple vessel disease of the heart 04/18/2009   Benign essential hypertension 04/18/2009   PPM-Medtronic 04/04/2009   Sinus node dysfunction (HCC) 01/26/2009   HLD (hyperlipidemia) 12/19/2008   Anxiety 12/19/2008   Persistent atrial fibrillation (HCC) 12/19/2008   GERD (gastroesophageal reflux disease) 12/19/2008   Abnormal involuntary movements 12/19/2008   Atrial fibrillation, chronic (HCC) 12/19/2008   Mixed hyperlipidemia 12/19/2008    Orientation RESPIRATION BLADDER Height & Weight        O2 ([place] 2L) Incontinent, External catheter Weight: 118 lb 2.7 oz (53.6 kg) Height:  5\' 5"  (165.1 cm)  BEHAVIORAL SYMPTOMS/MOOD NEUROLOGICAL BOWEL NUTRITION STATUS  Continent Diet (see dc summary)  AMBULATORY STATUS COMMUNICATION OF NEEDS Skin   Extensive Assist Verbally Normal                       Personal  Care Assistance Level of Assistance  Bathing, Feeding, Dressing Bathing Assistance: Limited assistance Feeding assistance: Limited assistance Dressing Assistance: Limited assistance     Functional Limitations Info  Sight, Hearing, Speech Sight Info: Impaired Hearing Info: Impaired Speech Info: Adequate    SPECIAL CARE FACTORS FREQUENCY  PT (By licensed PT), OT (By licensed OT)     PT Frequency: 5x week OT Frequency: 5x week            Contractures      Additional Factors Info  Code Status, Allergies, Insulin Sliding Scale Code Status Info: DNR Allergies Info: Amitriptyline Hcl  Plavix (Clopidogrel Bisulfate)  Sulfonamide Derivatives   Insulin Sliding Scale Info: see dc summary       Current Medications (03/14/2023):  This is the current hospital active medication list Current Facility-Administered Medications  Medication Dose Route Frequency Provider Last Rate Last Admin   acetaminophen (TYLENOL) tablet 650 mg  650 mg Oral Q6H PRN Emokpae, Ejiroghene E, MD   650 mg at 03/13/23 1741   Or   acetaminophen (TYLENOL) suppository 650 mg  650 mg Rectal Q6H PRN Emokpae, Ejiroghene E, MD       ALPRAZolam Prudy Feeler) tablet 0.5 mg  0.5 mg Oral TID PRN Howerter, Justin B, DO   0.5 mg at 03/13/23 1741   aspirin EC tablet 81 mg  81 mg Oral Daily Emokpae, Ejiroghene E, MD   81 mg at 03/13/23 1127   atorvastatin (LIPITOR) tablet 80 mg  80 mg Oral Daily Emokpae, Ejiroghene E, MD   80 mg at 03/13/23 1127   furosemide (LASIX) injection 40 mg  40 mg Intravenous BID Chandrasekhar, Mahesh A, MD       guaiFENesin (ROBITUSSIN) 100 MG/5ML liquid 5 mL  5 mL Oral Q4H PRN Amin, Ankit Chirag, MD       hydrALAZINE (APRESOLINE) injection 10 mg  10 mg Intravenous Q4H PRN Amin, Ankit Chirag, MD       insulin aspart (novoLOG) injection 0-5 Units  0-5 Units Subcutaneous QHS Emokpae, Ejiroghene E, MD       insulin aspart (novoLOG) injection 0-9 Units  0-9 Units Subcutaneous TID WC Emokpae, Ejiroghene E, MD        ipratropium-albuterol (DUONEB) 0.5-2.5 (3) MG/3ML nebulizer solution 3 mL  3 mL Nebulization Q4H PRN Amin, Ankit Chirag, MD       isosorbide mononitrate (IMDUR) 24 hr tablet 30 mg  30 mg Oral Daily Emokpae, Ejiroghene E, MD   30 mg at 03/13/23 1127   metoprolol succinate (TOPROL-XL) 24 hr tablet 50 mg  50 mg Oral BID Emokpae, Ejiroghene E, MD   50 mg at 03/13/23 2205   metoprolol tartrate (LOPRESSOR) injection 5 mg  5 mg Intravenous Q4H PRN Amin, Ankit Chirag, MD       polyethylene glycol (MIRALAX / GLYCOLAX) packet 17 g  17 g Oral Daily PRN Emokpae, Ejiroghene E, MD   17 g at 03/13/23 1741   potassium chloride (KLOR-CON) packet 40 mEq  40 mEq Oral BID Chandrasekhar, Mahesh A, MD       senna-docusate (Senokot-S) tablet 1 tablet  1 tablet Oral QHS PRN Amin, Ankit Chirag, MD       ticagrelor (BRILINTA) tablet 60 mg  60 mg Oral BID Emokpae,  Ejiroghene E, MD   60 mg at 03/13/23 2200   traZODone (DESYREL) tablet 50 mg  50 mg Oral QHS PRN Dimple Nanas, MD         Discharge Medications: Please see discharge summary for a list of discharge medications.  Relevant Imaging Results:  Relevant Lab Results:   Additional Information     B , LCSWA

## 2023-03-15 DIAGNOSIS — I214 Non-ST elevation (NSTEMI) myocardial infarction: Secondary | ICD-10-CM | POA: Diagnosis not present

## 2023-03-15 LAB — GLUCOSE, CAPILLARY
Glucose-Capillary: 68 mg/dL — ABNORMAL LOW (ref 70–99)
Glucose-Capillary: 86 mg/dL (ref 70–99)
Glucose-Capillary: 112 mg/dL — ABNORMAL HIGH (ref 70–99)
Glucose-Capillary: 95 mg/dL (ref 70–99)
Glucose-Capillary: 111 mg/dL — ABNORMAL HIGH (ref 70–99)

## 2023-03-15 LAB — CBC
HCT: 37.1 % (ref 36.0–46.0)
Hemoglobin: 11.5 g/dL — ABNORMAL LOW (ref 12.0–15.0)
MCH: 30.2 pg (ref 26.0–34.0)
MCHC: 31 g/dL (ref 30.0–36.0)
MCV: 97.4 fL (ref 80.0–100.0)
Platelets: 235 10*3/uL (ref 150–400)
RBC: 3.81 MIL/uL — ABNORMAL LOW (ref 3.87–5.11)
RDW: 18.5 % — ABNORMAL HIGH (ref 11.5–15.5)
WBC: 7.8 10*3/uL (ref 4.0–10.5)
nRBC: 0 % (ref 0.0–0.2)

## 2023-03-15 LAB — BASIC METABOLIC PANEL WITH GFR
Anion gap: 11 (ref 5–15)
BUN: 22 mg/dL (ref 8–23)
CO2: 27 mmol/L (ref 22–32)
Calcium: 9 mg/dL (ref 8.9–10.3)
Chloride: 99 mmol/L (ref 98–111)
Creatinine, Ser: 0.95 mg/dL (ref 0.44–1.00)
GFR, Estimated: 59 mL/min — ABNORMAL LOW (ref >60)
Glucose, Bld: 109 mg/dL — ABNORMAL HIGH (ref 70–99)
Potassium: 4.7 mmol/L (ref 3.5–5.1)
Sodium: 137 mmol/L (ref 135–145)

## 2023-03-15 LAB — BRAIN NATRIURETIC PEPTIDE: B Natriuretic Peptide: 148.5 pg/mL — ABNORMAL HIGH (ref 0.0–100.0)

## 2023-03-15 LAB — MAGNESIUM: Magnesium: 2.1 mg/dL (ref 1.7–2.4)

## 2023-03-15 MED ORDER — AMIODARONE HCL 200 MG PO TABS: 200.0000 mg | ORAL_TABLET | Freq: Every day | ORAL | Status: DC

## 2023-03-15 MED ORDER — METOPROLOL SUCCINATE ER 50 MG PO TB24: 50.0000 mg | ORAL_TABLET | Freq: Two times a day (BID) | ORAL | Status: AC

## 2023-03-15 MED ORDER — FUROSEMIDE 40 MG PO TABS
40.0000 mg | ORAL_TABLET | Freq: Every day | ORAL | Status: DC
  Administered 2023-03-15 – 2023-03-18 (×4): 40 mg via ORAL
  Filled 2023-03-15 (×4): qty 1

## 2023-03-15 MED ORDER — AMIODARONE HCL 200 MG PO TABS
200.0000 mg | ORAL_TABLET | Freq: Every day | ORAL | Status: DC
  Administered 2023-03-15 – 2023-03-18 (×4): 200 mg via ORAL
  Filled 2023-03-15 (×4): qty 1

## 2023-03-15 MED ORDER — ISOSORBIDE MONONITRATE ER 30 MG PO TB24: 30.0000 mg | ORAL_TABLET | Freq: Every day | ORAL | Status: AC

## 2023-03-15 MED ORDER — FUROSEMIDE 40 MG PO TABS: 40.0000 mg | ORAL_TABLET | Freq: Every day | ORAL | Status: DC

## 2023-03-15 MED ORDER — LORAZEPAM 0.5 MG PO TABS: 0.5000 mg | ORAL_TABLET | Freq: Three times a day (TID) | ORAL | 0 refills | Status: DC | PRN

## 2023-03-15 NOTE — TOC Progression Note (Signed)
Transition of Care [facility]) - Progression Note    Patient Details  Name: [name] MRN: [id number] Date of Birth: [birth year]  Transition of [facility]) CM/SW Contact  Patrice Paradise, LCSW Phone Number: 03/15/2023, 10:02 AM  Clinical Narrative:     Pt's auth started and is pending.  TOC team will continue to assist with discharge planning needs.   Expected Discharge Plan: Skilled Nursing Facility Barriers to Discharge: Continued Medical Work up  Expected Discharge Plan and Services In-house Referral: Clinical Social Work     Living arrangements for the past 2 months: Assisted Living Facility                                       Social Determinants of Health (SDOH) Interventions SDOH Screenings   Food Insecurity: No Food Insecurity (03/12/2023)  Housing: Low Risk  (03/12/2023)  Transportation Needs: No Transportation Needs (03/12/2023)  Utilities: Not At Risk (03/12/2023)  Alcohol Screen: Low Risk  (11/05/2022)  Depression (PHQ2-9): Low Risk  (11/05/2022)  Financial Resource Strain: Low Risk  (11/05/2022)  Physical Activity: Inactive (11/05/2022)  Social Connections: Moderately Integrated (11/05/2022)  Stress: No Stress Concern Present (11/05/2022)  Tobacco Use: Medium Risk (03/11/2023)    Readmission Risk Interventions    07/02/2022   12:34 PM  Readmission Risk Prevention Plan  Transportation Screening Complete  PCP or Specialist Appt within 5-7 Days Complete  Home Care Screening Complete  Medication Review (RN CM) Complete

## 2023-03-15 NOTE — Progress Notes (Signed)
PROGRESS NOTE    [name]  WUJ:[id number] DOB: 07/21/1936 DOA: 03/11/2023 PCP: [name], MD   Brief Narrative:   87 year old with history of paroxysmal A-fib status post pacemaker, CAD, diet CHF EF 55%, chronic hypoxia, COPD, CAD comes to the hospital with chest pain and diaphoresis.  Chest x-ray showed small pleural effusion.  Patient was [facility] from [facility] to [facility] secondary to elevated troponin/NSTEMI and started on heparin drip.  Seen by cardiology, completed 48 hours of heparin.  Currently medical management.  TOC working on SNF placement.  Assessment & Plan:  Principal Problem:   NSTEMI (non-ST elevated myocardial infarction) (HCC) Active Problems:   COPD (chronic obstructive pulmonary disease) (HCC)   Chronic respiratory failure with hypoxia (HCC)   Chronic systolic heart failure (HCC)   Atrial fibrillation, chronic (HCC)   PPM-Medtronic   Essential hypertension   CAD (coronary artery disease)   CHF (congestive heart failure) (HCC)      NSTEMI  History of multivessel CAD status post complex PCI to RCA in 2022 -48 hours of heparin drip completed.  Echo shows no wall motion abnormality.  Continue aspirin, statin, metoprolol and Imdur.  Cardiology managing.  Transitioning to oral diuretics today.    Acute on chronic diastolic CHF, EF 95% -Repeat echo with EF of 55%, mildly reduced RV, no wall motion abnormalities Volume status improving, cardiology transitioning patient to p.o. Lasix. Cardiology following.    Severe hypokalemia, improved -Replete as needed   Chronic respiratory failure with hypoxia (HCC) On chronic O2- 2L.  Stable, monitor   COPD (chronic obstructive pulmonary disease) (HCC) -Nebs.  And scheduled.   Atrial fibrillation, chronic (HCC) Pacemaker status.   In normal sinus rhythm.  Not on anticoagulation due to risk of falls.  Continue Toprol-XL.  Cardiology planning to resume amiodarone as outpatient   Essential  hypertension Stable. -Resume metoprolol,   DVT prophylaxis: Heparin Code Status: DNR Family Communication: Called April again, went to voicemail.  Disposition Plan: Working on SNF placement Waiting on auth      Diet Orders (From admission, onward)     Start     Ordered   03/13/23 1018  Diet Heart Room service appropriate? Yes with Assist; Fluid consistency: Thin  Diet effective now       Question Answer Comment  Room service appropriate? Yes with Assist   Fluid consistency: Thin      03/13/23 1017            Subjective: Doping ok no complaints.    Examination:  Constitutional: Not in acute distress.  Elderly frail Respiratory: Bilateral diminished breath sounds at the bases Cardiovascular: Normal sinus rhythm, no rubs Abdomen: Nontender nondistended good bowel sounds Musculoskeletal: No edema noted Skin: No rashes seen Neurologic: CN 2-12 grossly intact.  And nonfocal Psychiatric: Normal judgment and insight. Alert and oriented x 3. Normal mood.  Objective: Vitals:   03/14/23 1509 03/14/23 2328 03/15/23 0024 03/15/23 0441  BP: (!) 103/59 (!) 121/53 98/61 119/71  Pulse: 72 62 60 60  Resp: 20  (!) 22 20  Temp: 97.8 F (36.6 C)  (!) 97.4 F (36.3 C) 97.7 F (36.5 C)  TempSrc: Oral  Oral Oral  SpO2: 100%  99% 99%  Weight:    53.2 kg  Height:        Intake/Output Summary (Last 24 hours) at 03/15/2023 0738 Last data filed at 03/15/2023 0300 Gross per 24 hour  Intake 840 ml  Output 4350 ml  Net -3510 ml   Filed Weights   03/14/23 0013 03/14/23 0528 03/15/23 0441  Weight: 52.1 kg 53.6 kg 53.2 kg    Scheduled Meds:  aspirin EC  81 mg Oral Daily   atorvastatin  80 mg Oral Daily   furosemide  40 mg Intravenous BID   insulin aspart  0-5 Units Subcutaneous QHS   insulin aspart  0-9 Units Subcutaneous TID WC   isosorbide mononitrate  30 mg Oral Daily   metoprolol succinate  50 mg Oral BID   potassium chloride  40 mEq Oral BID   ticagrelor  60 mg Oral  BID   Continuous Infusions:  Nutritional status     Body mass index is 19.52 kg/m.  Data Reviewed:   CBC: Recent Labs  Lab 03/11/23 1625 03/12/23 0731 03/13/23 0137 03/14/23 0130 03/15/23 0436  WBC 8.4 8.9 8.3 6.8 7.8  HGB 12.1 11.0* 11.4* 11.5* 11.5*  HCT 38.3 34.4* 36.1 35.9* 37.1  MCV 95.8 92.7 95.5 94.5 97.4  PLT 256 228 221 238 235   Basic Metabolic Panel: Recent Labs  Lab 03/12/23 0537 03/13/23 0635 03/13/23 1520 03/14/23 0130 03/15/23 0121  NA 139 137 136 137 137  K 3.6 2.7* 4.7 4.4 4.7  CL 102 96* 99 100 99  CO2 26 27 28 28 27   GLUCOSE 79 94 129* 104* 109*  BUN 13 18 16 16 22   CREATININE 1.04* 1.04* 1.02* 1.18* 0.95  CALCIUM 8.9 8.6* 9.1 9.0 9.0  MG  --   --   --   --  2.1   GFR: Estimated Creatinine Clearance: 36.4 mL/min (by C-G formula based on SCr of 0.95 mg/dL). Liver Function Tests: Recent Labs  Lab 03/11/23 1625  AST 28  ALT 16  ALKPHOS 71  BILITOT 1.2  PROT 7.5  ALBUMIN 3.4*   Recent Labs  Lab 03/11/23 1625  LIPASE 30   No results for input(s): "AMMONIA" in the last 168 hours. Coagulation Profile: No results for input(s): "INR", "PROTIME" in the last 168 hours. Cardiac Enzymes: No results for input(s): "CKTOTAL", "CKMB", "CKMBINDEX", "TROPONINI" in the last 168 hours. BNP (last 3 results) No results for input(s): "PROBNP" in the last 8760 hours. HbA1C: No results for input(s): "HGBA1C" in the last 72 hours. CBG: Recent Labs  Lab 03/13/23 2055 03/14/23 1038 03/14/23 1151 03/14/23 1628 03/14/23 2107  GLUCAP 90 98 87 92 106*   Lipid Profile: No results for input(s): "CHOL", "HDL", "LDLCALC", "TRIG", "CHOLHDL", "LDLDIRECT" in the last 72 hours. Thyroid Function Tests: No results for input(s): "TSH", "T4TOTAL", "FREET4", "T3FREE", "THYROIDAB" in the last 72 hours. Anemia Panel: No results for input(s): "VITAMINB12", "FOLATE", "FERRITIN", "TIBC", "IRON", "RETICCTPCT" in the last 72 hours. Sepsis Labs: No results for  input(s): "PROCALCITON", "LATICACIDVEN" in the last 168 hours.  No results found for this or any previous visit (from the past 240 hour(s)).       Radiology Studies: No results found.         LOS: 3 days   Time spent= 35 mins     Joline Maxcy, MD [facility]  If 7PM-7AM, please contact night-coverage  03/15/2023, 7:38 AM

## 2023-03-15 NOTE — Plan of Care (Signed)

## 2023-03-15 NOTE — Progress Notes (Addendum)
Progress Note  Patient Name: SCARLET RYKER Date of Encounter: 03/15/2023 Primary Cardiologist: Nona Dell, MD   Subjective   Low BP over night with low temp. BNP much improved from DC Patient notes many concerns about her living situation.  Most of our discussion relates to her wish to change POA. Difficult to redirect.  Vital Signs    Vitals:   03/14/23 1509 03/14/23 2328 03/15/23 0024 03/15/23 0441  BP: (!) 103/59 (!) 121/53 98/61 119/71  Pulse: 72 62 60 60  Resp: 20  (!) 22 20  Temp: 97.8 F (36.6 C)  (!) 97.4 F (36.3 C) 97.7 F (36.5 C)  TempSrc: Oral  Oral Oral  SpO2: 100%  99% 99%  Weight:    53.2 kg  Height:        Intake/Output Summary (Last 24 hours) at 03/15/2023 0649 Last data filed at 03/15/2023 0300 Gross per 24 hour  Intake 840 ml  Output 4350 ml  Net -3510 ml   Filed Weights   03/14/23 0013 03/14/23 0528 03/15/23 0441  Weight: 52.1 kg 53.6 kg 53.2 kg    Physical Exam   GEN: No acute distress.   Neck: No JVD Cardiac: RRR, systolic murmur, no rubs, or gallops.  Respiratory: CTAB GI: Soft, nontender, non-distended  MS: Non pitting edema  Labs   Telemetry: SR with some atrial pacing   Chemistry Recent Labs  Lab 03/11/23 1625 03/12/23 0537 03/13/23 1520 03/14/23 0130 03/15/23 0121  NA 139   < > 136 137 137  K 4.7   < > 4.7 4.4 4.7  CL 101   < > 99 100 99  CO2 28   < > 28 28 27   GLUCOSE 115*   < > 129* 104* 109*  BUN 11   < > 16 16 22   CREATININE 0.72   < > 1.02* 1.18* 0.95  CALCIUM 9.6   < > 9.1 9.0 9.0  PROT 7.5  --   --   --   --   ALBUMIN 3.4*  --   --   --   --   AST 28  --   --   --   --   ALT 16  --   --   --   --   ALKPHOS 71  --   --   --   --   BILITOT 1.2  --   --   --   --   GFRNONAA >60   < > 54* 45* 59*  ANIONGAP 10   < > 9 9 11    < > = values in this interval not displayed.     Hematology Recent Labs  Lab 03/13/23 0137 03/14/23 0130 03/15/23 0436  WBC 8.3 6.8 7.8  RBC 3.78* 3.80* 3.81*  HGB 11.4*  11.5* 11.5*  HCT 36.1 35.9* 37.1  MCV 95.5 94.5 97.4  MCH 30.2 30.3 30.2  MCHC 31.6 32.0 31.0  RDW 18.8* 18.7* 18.5*  PLT 221 238 235    Cardiac EnzymesNo results for input(s): "TROPONINI" in the last 168 hours. No results for input(s): "TROPIPOC" in the last 168 hours.   BNP Recent Labs  Lab 03/11/23 1625 03/15/23 0436  BNP 1,151.0* 148.5*     DDimer No results for input(s): "DDIMER" in the last 168 hours.   Cardiac Studies   Cardiac Studies & Procedures   CARDIAC CATHETERIZATION  CARDIAC CATHETERIZATION 05/28/2021  Narrative   ---------- IVUS-guided PTCA/PCI: Combination SHOCKWAVE LITHOTRIPSY, & SCOREFLEX ANGIOPLASTY  with GUIDELINER support-----------   LESION #1 prox RCA lesion is 85% stenosed distal to previous stent.   Scoring balloon angioplasty was performed following SHOCKWAVE LITHOTRIPSY (3.0 x 12) BALLN (2 runs of 10 pulses) using a BALLN SCOREFLEX 3.0X15.   Lesion #2 mid RCA to Dist RCA lesion is 65% stenosed.   Scoring balloon angioplasty was performed using a BALLN SCOREFLEX 3.0X15.  With Use of GuideLiner Catheter Extension   A drug-eluting stent was successfully placed covering both LESIONS #1 and #2 (overlapping previous stent) using a SYNERGY XD 3.0X48.  Postdilated to 3.6 mm   Post intervention, there is a 0% residual stenosis in both these LESIONS #1 and #2.Marland Kitchen   LESION #3 Ost RCA to Prox RCA previously placed stents are 70% stenosed.   Scoring balloon angioplasty was performed using a BALLN SCOREFLEX 3.0X15 -> followed by post dilation with 3.5 mm x 15 mm [place] balloon. ->  Achieved 3.5 mm.   Post intervention, there is a 10% residual in-stent restenosis.   ---------------------------------   Lesion #4 Ost RCA lesion is 75% stenosed.   A drug-eluting stent was successfully placed at the ostium and overlapping previous stent proximally, using a SYNERGY XD 3.50X12.  Postdilated to 3.65 mm.   Post intervention, there is a 0% residual stenosis.   Post  intervention, there is a 0% residual stenosis with exception of the 10% residual ISR in the previously placed stents which are likely undersized 2.75 mm stents.  SUMMARY Successful Complext DES PCI of Ostial & mid to Distal RCA using Shockwave Lithotrypsy and ScoreFlex Scoring Balloon Angioplasty with Guideliner Extension Catheter. Mid-Distal RCA tandem lesions: Finally treated with distally overlapping SYNERGY XD (DES) 3.0X48 mm postdilated to 3.6 mm High-pressure score flex angioplasty followed by post dilation of previously placed ostial and proximal RCA stent dilating up to 3.5 mm-residual 10% Ostial RCA PCI with SYNERGY XD 3.5X12. ->  Deployed at 3.65 mm. --> All lesions segments reduced to 0% with exception of the in-stent restenosis portion which was 10%. --> TIMI-3 flow pre and post.  Findings Coronary Findings Diagnostic  Dominance: Right  Left Main Vessel was not injected. Ost LM to Dist LM lesion is 35% stenosed.  Left Anterior Descending There is moderate diffuse disease throughout the vessel. Ost LAD to Prox LAD lesion is 75% stenosed. Mid LAD lesion is 80% stenosed. Mid LAD to Dist LAD lesion is 80% stenosed.  Left Circumflex There is moderate diffuse disease throughout the vessel. Mid Cx to Dist Cx lesion is 70% stenosed. Dist Cx lesion is 70% stenosed.  First Obtuse Marginal Branch 1st Mrg lesion is 80% stenosed.  Right Coronary Artery Vessel is large. There is moderate diffuse disease throughout the vessel. Ost RCA lesion is 75% stenosed. The lesion is eccentric. Potentially related to guide catheter Ost RCA to Prox RCA lesion is 70% stenosed. The lesion was previously treated using a drug eluting stent over 2 years ago. Previously placed stent displays restenosis. Ultrasound (IVUS) was performed. Minimum lumen diameter: 2 mm. Severe plaque burden was detected. IVUS has determined that the lesion is fibrinous and heterogeneous. Prox RCA lesion is 85%  stenosed. Mid RCA to Dist RCA lesion is 65% stenosed.  Right Ventricular Branch Vessel is small in size.  Intervention  Ost RCA lesion Stent Lesion length:  8 mm. CATH LAUNCHER 6FR AL.75 guide catheter was inserted. Lesion crossed with guidewire using a WIRE ASAHI PROWATER 180CM. Pre-stent angioplasty was not performed. A drug-eluting stent was successfully placed using a  SYNERGY XD 3.50X12. Maximum pressure: 18 atm. Inflation time: 20 sec. Stent strut is well apposed. Postdilated with stent balloon to 3.65 mm Stent overlaps previously placed stent. Post-stent angioplasty was performed. Maximum pressure:  20 atm. Inflation time: 20 sec. Stent balloon, high ATM Stent (Also treats lesions: Ost RCA to Prox RCA, Prox RCA, and Mid RCA to Dist RCA) Lesion length:  40 mm. Lesion crossed with guidewire using a WIRE ASAHI PROWATER 180CM. Pre-stent angioplasty was performed using a BALLN SAPPHIRE 2.0X12. Maximum pressure:  12 atm. Inflation time:  20 sec. Initial PTCA followed by Shockwave &amp; ScoreFlex A drug-eluting stent was successfully placed using a SYNERGY XD 3.0X48. Maximum pressure: 18 atm. Inflation time: 20 sec. Stent strut is well apposed. Postdilated to 3.6 mm Stent overlaps previously placed stent. Post-stent angioplasty was performed using a BALL SAPPHIRE NC24 3.5X26. Maximum pressure:  18 atm. Inflation time:  10 sec. GuideLiner catheter was used to advance the Scoreflex balloon to the distal part of the lesion, and then also to place the stent distally. Post-Intervention Lesion Assessment The intervention was successful. Pre-interventional TIMI flow is 3. Post-intervention TIMI flow is 3. Treated lesion length:  8 mm. No complications occurred at this lesion. There is a 0% residual stenosis post intervention.  Ost RCA to Prox RCA lesion Angioplasty Lesion length:  28 mm. CATHETER SHOCKWAVE 3.0X12 guide catheter was inserted. WIRE ASAHI PROWATER 180CM guidewire used to cross lesion.  Scoring balloon angioplasty was performed using a BALLN SCOREFLEX 3.0X15. Maximum pressure: 16 atm. Inflation time: 20 sec. Stent (Also treats lesions: Ost RCA, Prox RCA, and Mid RCA to Dist RCA) See details in Ost RCA lesion. Post-Intervention Lesion Assessment The intervention was successful. Pre-interventional TIMI flow is 3. Post-intervention TIMI flow is 3. Treated lesion length:  28 mm. No complications occurred at this lesion. There is a 10% residual stenosis post intervention.  Prox RCA lesion Angioplasty Lesion length:  44 mm. CATHETER SHOCKWAVE 3.0X12 guide catheter was inserted. WIRE ASAHI PROWATER 180CM guidewire used to cross lesion. Scoring balloon angioplasty was performed using a BALLN SCOREFLEX 3.0X15. BALLN SAPPHIRE 2.0X12 3.0 mm  x 12 mm shockwave lithotripsy balloon -&gt; 2 cycles of 10 pulses 3.0 mm x 15 mm core flex balloon at this lesion and several other lesions downstream.. Maximum pressure: 14 atm. Inflation time: 15 sec. Stent (Also treats lesions: Ost RCA, Ost RCA to Prox RCA, and Mid RCA to Dist RCA) See details in Ost RCA lesion. Post-Intervention Lesion Assessment The intervention was successful. Pre-interventional TIMI flow is 3. Post-intervention TIMI flow is 3. Treated lesion length:  44 mm. No complications occurred at this lesion. There is a 0% residual stenosis post intervention.  Mid RCA to Dist RCA lesion Stent (Also treats lesions: Ost RCA, Ost RCA to Prox RCA, and Prox RCA) See details in Ost RCA lesion. Post-Intervention Lesion Assessment The intervention was successful. Pre-interventional TIMI flow is 3. Post-intervention TIMI flow is 3. No complications occurred at this lesion. There is a 0% residual stenosis post intervention.   CARDIAC CATHETERIZATION  CARDIAC CATHETERIZATION 03/21/2021  Narrative   Severe calcified diffuse three-vessel coronary artery disease.  Right dominant anatomy.   Nonobstructive left main   Ostial 75% calcified LAD  with tandem mid 80% and distal 80% stenoses.   Diffusely diseased circumflex with 75% proximal to mid first obtuse marginal.  Mid to distal circumflex 70% before small second obtuse marginal.   Dominant right coronary with previously placed proximal stent having diffuse 50% ISR, 80  to 90% segmental mid stenosis (probable culprit), and continuation of segmental 60% stenosis to the distal segment where the artery opens up to a 3.0 diameter.  PDA and LV branch are large.   Normal LVEDP.  EF 65%.  RECOMMENDATIONS: Medical therapy versus CABG versus elevated risk RCA PCI.  Consider heart team approach after conversation with the patient and family about level of aggressiveness that they feel is appropriate. Aggressive risk factor modification and anti-ischemic therapy. If RCA PCI, would recommend femoral approach rather than radial for better catheter support during the intervention.  Findings Coronary Findings Diagnostic  Dominance: Right  Left Main Ost LM to Dist LM lesion is 35% stenosed.  Left Anterior Descending There is moderate diffuse disease throughout the vessel. Ost LAD to Prox LAD lesion is 75% stenosed. Mid LAD lesion is 80% stenosed. Mid LAD to Dist LAD lesion is 80% stenosed.  Left Circumflex There is moderate diffuse disease throughout the vessel. Mid Cx to Dist Cx lesion is 70% stenosed. Dist Cx lesion is 70% stenosed.  First Obtuse Marginal Branch 1st Mrg lesion is 80% stenosed.  Right Coronary Artery There is moderate diffuse disease throughout the vessel. Ost RCA to Prox RCA lesion is 50% stenosed. The lesion was previously treated . Prox RCA lesion is 85% stenosed. Mid RCA to Dist RCA lesion is 65% stenosed.  Intervention  No interventions have been documented.   STRESS TESTS  MYOCARDIAL PERFUSION IMAGING 08/25/2014   ECHOCARDIOGRAM  ECHOCARDIOGRAM COMPLETE 03/12/2023  Narrative ECHOCARDIOGRAM REPORT    Patient Name:   TORYA CAREY Date of Exam:  03/12/2023 Medical Rec #:  [id number]    Height:       65.0 in Accession #:    [id number]   Weight:       118.4 lb Date of Birth:  29-Jan-1936    BSA:          1.583 m Patient Age:    87 years     BP:           120/69 mmHg Patient Gender: F            HR:           62 bpm. Exam Location:  Inpatient  Procedure: 2D Echo, Cardiac Doppler and Color Doppler  Indications:    CHF-Acute Diastolic I50.31  History:        Patient has prior history of Echocardiogram examinations, most recent 03/20/2021. Abnormal ECG and Pacemaker.  Sonographer:    Darlys Gales Referring Phys: Zannie Cove  IMPRESSIONS   1. Left ventricular ejection fraction, by estimation, is 55 to 60%. The left ventricle has normal function. The left ventricle has no regional wall motion abnormalities. There is mild asymmetric left ventricular hypertrophy of the basal-septal segment. Left ventricular diastolic parameters are indeterminate. 2. Right ventricular systolic function is mildly reduced. The right ventricular size is normal. There is mildly elevated pulmonary artery systolic pressure. The estimated right ventricular systolic pressure is 38.8 mmHg. 3. Left atrial size was severely dilated. 4. Right atrial size was moderately dilated. 5. The mitral valve is degenerative. Moderate mitral valve regurgitation. No evidence of mitral stenosis. Severe mitral annular calcification. 6. The aortic valve is abnormal. There is mild calcification of the aortic valve. There is moderate thickening of the aortic valve. Aortic valve regurgitation is mild. Aortic valve sclerosis/calcification is present, without any evidence of aortic stenosis. 7. The inferior vena cava is normal in size with greater than 50% respiratory variability, suggesting right  atrial pressure of 3 mmHg.  FINDINGS Left Ventricle: Left ventricular ejection fraction, by estimation, is 55 to 60%. The left ventricle has normal function. The left ventricle has no regional  wall motion abnormalities. The left ventricular internal cavity size was normal in size. There is mild asymmetric left ventricular hypertrophy of the basal-septal segment. Left ventricular diastolic function could not be evaluated due to mitral annular calcification (moderate or greater). Left ventricular diastolic parameters are indeterminate.  Right Ventricle: The right ventricular size is normal. No increase in right ventricular wall thickness. Right ventricular systolic function is mildly reduced. There is mildly elevated pulmonary artery systolic pressure. The tricuspid regurgitant velocity is 2.99 m/s, and with an assumed right atrial pressure of 3 mmHg, the estimated right ventricular systolic pressure is 38.8 mmHg.  Left Atrium: Left atrial size was severely dilated.  Right Atrium: Right atrial size was moderately dilated.  Pericardium: There is no evidence of pericardial effusion.  Mitral Valve: The mitral valve is degenerative in appearance. Severe mitral annular calcification. Moderate mitral valve regurgitation. No evidence of mitral valve stenosis.  Tricuspid Valve: The tricuspid valve is normal in structure. Tricuspid valve regurgitation is mild . No evidence of tricuspid stenosis.  Aortic Valve: The aortic valve is abnormal. There is mild calcification of the aortic valve. There is moderate thickening of the aortic valve. Aortic valve regurgitation is mild. Aortic regurgitation PHT measures 689 msec. Aortic valve sclerosis/calcification is present, without any evidence of aortic stenosis. Aortic valve mean gradient measures 3.0 mmHg. Aortic valve peak gradient measures 7.5 mmHg. Aortic valve area, by VTI measures 1.94 cm.  Pulmonic Valve: The pulmonic valve was normal in structure. Pulmonic valve regurgitation is mild to moderate. No evidence of pulmonic stenosis.  Aorta: The aortic root is normal in size and structure.  Venous: The inferior vena cava is normal in size with  greater than 50% respiratory variability, suggesting right atrial pressure of 3 mmHg.  IAS/Shunts: No atrial level shunt detected by color flow Doppler.  Additional Comments: A device lead is visualized in the right atrium and right ventricle.   LEFT VENTRICLE PLAX 2D LVIDd:         4.70 cm   Diastology LVIDs:         3.90 cm   LV e' medial:    5.11 cm/s LV PW:         0.70 cm   LV E/e' medial:  17.8 LV IVS:        1.20 cm   LV e' lateral:   7.62 cm/s LVOT diam:     1.80 cm   LV E/e' lateral: 12.0 LV SV:         57 LV SV Index:   36 LVOT Area:     2.54 cm   RIGHT VENTRICLE RV S prime:     11.20 cm/s TAPSE (M-mode): 2.5 cm  LEFT ATRIUM           Index        RIGHT ATRIUM           Index LA Vol (A2C): 73.1 ml 46.18 ml/m  RA Area:     14.70 cm LA Vol (A4C): 93.9 ml 59.32 ml/m  RA Volume:   31.40 ml  19.84 ml/m AORTIC VALVE AV Area (Vmax):    1.79 cm AV Area (Vmean):   2.13 cm AV Area (VTI):     1.94 cm AV Vmax:           137.00  cm/s AV Vmean:          87.100 cm/s AV VTI:            0.294 m AV Peak Grad:      7.5 mmHg AV Mean Grad:      3.0 mmHg LVOT Vmax:         96.20 cm/s LVOT Vmean:        72.800 cm/s LVOT VTI:          0.224 m LVOT/AV VTI ratio: 0.76 AI PHT:            689 msec  AORTA Ao Root diam: 3.40 cm  MITRAL VALVE               TRICUSPID VALVE MV Area (PHT): 2.83 cm    TR Peak grad:   35.8 mmHg MV Decel Time: 268 msec    TR Vmax:        299.00 cm/s MV E velocity: 91.10 cm/s MV A velocity: 35.30 cm/s  SHUNTS MV E/A ratio:  2.58        Systemic VTI:  0.22 m Systemic Diam: 1.80 cm  Weston Brass MD Electronically signed by Weston Brass MD Signature Date/Time: 03/12/2023/2:46:38 PM    Final                  Assessment & Plan    Acute on chronic with Preserved EF Moderate MR HTN - BNP much improved - transitioned to lasix 40 mg PO daily (not prior on diuretics)  NSTEMI  - suspect Type II MI, no plans for LHC - completed 48 hrs  of AC - No wall motion abnormalities on Echo - A pacing with 1st HB and prolonged Qtc, ranexa stopped - continue DAPT and Imdur   COPD (chronic obstructive pulmonary disease) (HCC)  Chronic respiratory failure with hypoxia  - On chronic O2- 2L, currently off O2    Paroxysmal Atrial fibrillation  With Tachy/brady s/p PPM - SR with rare A pacing -Currently in sinus rhythm, not on anticoagulation due to frequent falls and confusion. -Continue metoprolol - will resume home amiodarone  Signing off: arranging outpatient f/u   For questions or updates, please contact [facility] Please consult [email or web address] for contact info under Cardiology/STEMI.      Riley Lam, MD FASE [facility] Cardiologist [facility]  [address] [place], [address] [place], [place] [address] [phone]  6:50 AM

## 2023-03-16 DIAGNOSIS — I214 Non-ST elevation (NSTEMI) myocardial infarction: Secondary | ICD-10-CM | POA: Diagnosis not present

## 2023-03-16 LAB — GLUCOSE, CAPILLARY
Glucose-Capillary: 98 mg/dL (ref 70–99)
Glucose-Capillary: 105 mg/dL — ABNORMAL HIGH (ref 70–99)
Glucose-Capillary: 90 mg/dL (ref 70–99)
Glucose-Capillary: 107 mg/dL — ABNORMAL HIGH (ref 70–99)
Glucose-Capillary: 148 mg/dL — ABNORMAL HIGH (ref 70–99)

## 2023-03-16 MED ORDER — ENOXAPARIN SODIUM 30 MG/0.3ML IJ SOSY
30.0000 mg | PREFILLED_SYRINGE | INTRAMUSCULAR | Status: DC
  Administered 2023-03-16 – 2023-03-17 (×2): 30 mg via SUBCUTANEOUS
  Filled 2023-03-16 (×2): qty 0.3

## 2023-03-16 MED ORDER — ORAL CARE MOUTH RINSE: 15.0000 mL | OROMUCOSAL | Status: DC | PRN

## 2023-03-16 NOTE — Progress Notes (Signed)
Mobility Specialist Progress Note:    03/16/23 1654  Mobility  Activity Ambulated with assistance in hallway  Level of Assistance Contact guard assist, steadying assist  Assistive Device Front wheel walker  Distance Ambulated (ft) 90 ft  Activity Response Tolerated well  Mobility Referral Yes  $Mobility charge 1 Mobility  Mobility Specialist Start Time (ACUTE ONLY) 1513  Mobility Specialist Stop Time (ACUTE ONLY) 1533  Mobility Specialist Time Calculation (min) (ACUTE ONLY) 20 min   Pt received in bed, hesitant but agreeable to ambulate. Pt needed minA w/ bed mobility and contact guard for STS/ambulation. C/o feeling weak and SOB (ambulated on 1L/min SPO2 95%-98%). Pt needed a short seated break d/t SOB, offered to be wheeled back to room but pt requested to walk back. Situated back in bed w/ call bell and personal belongings in reach all needs met.  Thompson Grayer Mobility Specialist  Please contact vis Secure Chat or  Rehab Office [phone]

## 2023-03-16 NOTE — Progress Notes (Signed)
Physical Therapy Treatment Patient Details Name: [name] MRN: [id number] DOB: 1936/06/15 Today's Date: 03/16/2023   History of Present Illness Pt is an 87 y/o female admitted 8/7 with complaints of Chest pain, difficulty breathing, but without associated diaphoresis.  Dx of NSTEMI. PMHx:  afib, Chronic back pain, COPD, HTN, HOH, neuromuscular d/o, tachy/brady syndrome.    PT Comments  Pt agreeable to OOB gait training. Pt demonstrating good mobility progression, but does show signs of fatigue throughout mobility (RR up to 30s, UE trembling, standing rest breaks x2). Pt with stable VS throughout session and no complaints of pain. Pt overall requiring min physical assist for all mobility, d/c plan remains appropriate at this time.       If plan is discharge home, recommend the following: A little help with walking and/or transfers;A little help with bathing/dressing/bathroom   Can travel by private vehicle        Equipment Recommendations  None recommended by PT    Recommendations for Other Services       Precautions / Restrictions Precautions Precautions: Fall Restrictions Weight Bearing Restrictions: No     Mobility  Bed Mobility Overal bed mobility: Needs Assistance Bed Mobility: Supine to Sit     Supine to sit: Min assist, Used rails, HOB elevated          Transfers Overall transfer level: Needs assistance Equipment used: Rolling walker (2 wheels) Transfers: Sit to/from Stand Sit to Stand: Min assist           General transfer comment: steadying assist, cues for hand placement    Ambulation/Gait Ambulation/Gait assistance: Min assist Gait Distance (Feet): 100 Feet Assistive device: Rolling walker (2 wheels) Gait Pattern/deviations: Step-through pattern, Decreased stride length, Trunk flexed Gait velocity: decr     General Gait Details: assist to steady and physically maneuver RW around obstacles in hallway, cues for upright posture and proximity  to RW. VSS on RA   Stairs             Wheelchair Mobility     Tilt Bed    Modified Rankin (Stroke Patients Only)       Balance Overall balance assessment: Needs assistance Sitting-balance support: No upper extremity supported Sitting balance-Leahy Scale: Good     Standing balance support: Single extremity supported, No upper extremity supported, During functional activity Standing balance-Leahy Scale: Fair                              Cognition Arousal: Alert Behavior During Therapy: WFL for tasks assessed/performed Overall Cognitive Status: Difficult to assess Area of Impairment: Attention, Following commands, Memory                   Current Attention Level: Sustained Memory: Decreased short-term memory Following Commands: Follows one step commands with increased time       General Comments: pt requires repeated cuing throughout session, suspect due to hearing loss more than cog. Pt perseverative on her granddaughter "trying to kill me" throughout session, pt states she alerted her MD to her fears        Exercises      General Comments General comments (skin integrity, edema, etc.): SPO2 98-100% on RA, placed back on 1LO2 at end of session as pt was on this upon PT arrival      Pertinent Vitals/Pain Pain Assessment Pain Assessment: Faces Faces Pain Scale: No hurt Pain Intervention(s): Monitored during session, Limited activity within  patient's tolerance    Home Living                          Prior Function            PT Goals (current goals can now be found in the care plan section) Acute Rehab PT Goals Patient Stated Goal: get walking more PT Goal Formulation: With patient Time For Goal Achievement: 03/27/23 Potential to Achieve Goals: Good Progress towards PT goals: Progressing toward goals    Frequency    Min 1X/week      PT Plan      Co-evaluation              AM-PAC PT "6 Clicks"  Mobility   Outcome Measure  Help needed turning from your back to your side while in a flat bed without using bedrails?: None Help needed moving from lying on your back to sitting on the side of a flat bed without using bedrails?: A Little Help needed moving to and from a bed to a chair (including a wheelchair)?: A Little Help needed standing up from a chair using your arms (e.g., wheelchair or bedside chair)?: A Little Help needed to walk in hospital room?: A Lot Help needed climbing 3-5 steps with a railing? : Total 6 Click Score: 16    End of Session   Activity Tolerance: Patient tolerated treatment well Patient left: in chair;with call bell/phone within reach;with chair alarm set Nurse Communication: Mobility status PT Visit Diagnosis: Other abnormalities of gait and mobility (R26.89);Muscle weakness (generalized) (M62.81);Difficulty in walking, not elsewhere classified (R26.2)     Time: 1610-9604 PT Time Calculation (min) (ACUTE ONLY): 30 min  Charges:    $Gait Training: 8-22 mins $Therapeutic Activity: 8-22 mins PT General Charges $$ ACUTE PT VISIT: 1 Visit                    Marye Round, PT DPT Acute Rehabilitation Services Secure Chat Preferred  Office [phone]     Sheliah Plane 03/16/2023, 10:47 AM

## 2023-03-16 NOTE — Progress Notes (Signed)
PROGRESS NOTE [name]  ZOX:[id number] DOB: [birth year] DOA: 03/11/2023 PCP: [name], MD  Brief Narrative/Hospital Course: 87 year old with history of paroxysmal A-fib status post pacemaker, CAD, diet CHF EF 55%, chronic hypoxia, COPD, CAD comes to the hospital with chest pain and diaphoresis.  Chest x-ray showed small pleural effusion.  Patient was transferred from [facility] to [facility] secondary to elevated troponin/NSTEMI and started on heparin drip-Seen by cardiology, completed 48 hours of heparin.  Currently medical management.  Patient is deconditioned and weak and will need placement TOC has been consulted    Subjective: Patient seen and examined this morning She is alert awake oriented complains of right-sided chest pain Would like to talk to Child psychotherapist, reports she is not ready for skilled nursing facility today   Assessment and Plan: Principal Problem:   NSTEMI (non-ST elevated myocardial infarction) (HCC) Active Problems:   COPD (chronic obstructive pulmonary disease) (HCC)   Chronic respiratory failure with hypoxia (HCC)   Chronic systolic heart failure (HCC)   Atrial fibrillation, chronic (HCC)   PPM-Medtronic   Essential hypertension   CAD (coronary artery disease)   CHF (congestive heart failure) (HCC)    NSTEMI  Multivessel CAD s/p PCI to RCA in 2022: Suspect type II MI, seen by cardiology managed conservatively with heparin drip x 48 hours. Continue left-sided chest pain. Continue aspirin 81, Lipitor, Brilinta, Imdur, Toprol and oral diuretics   Acute on chronic diastolic CHF, EF 81%: Repeat echo with EF of 55%, mildly reduced RV, no wall motion abnormalities Slightly congested today keep on diuretics on p.o. Lasix monitor intake output  So far net balance and weight as below Cont to monitor daily I/O,weight, electrolytes and net balance as below. Keep on  salt/fluid restricted diet and monitor in tele. Net IO Since Admission: -6,359.39 mL  [03/16/23 1020]  Filed Weights   03/15/23 0441 03/16/23 0023 03/16/23 0500  Weight: 53.2 kg 52 kg 52 kg   Severe hypokalemia, improved Improved to 4.2  Chronic respiratory failure with hypoxia COPD: Stable, on chronic O2- 2L. Cont nebs   Atrial fibrillation, chroni S/p PPM: In NSR.Not on anticoagulation due to risk of falls.Continue Toprol-XL and po amio   Essential hypertension Bp stable.  Continue metoprolol Imdur and diuretics  DVT prophylaxis: enoxaparin (LOVENOX) injection 30 mg Start: 03/16/23 0830 Code Status:   Code Status: DNR Family Communication: plan of care discussed with patient at bedside. Patient status is: Inpatient because of nstemi Level of care: Telemetry Medical   Dispo: The patient is from: alf            Anticipated disposition: SNF  Objective: Vitals last 24 hrs: Vitals:   03/16/23 0023 03/16/23 0319 03/16/23 0500 03/16/23 0740  BP: (!) 109/59 117/77  132/80  Pulse:  79  78  Resp: 18 20  19   Temp: (!) 97.5 F (36.4 C) 97.6 F (36.4 C)  97.8 F (36.6 C)  TempSrc: Axillary Oral  Oral  SpO2:  100%  100%  Weight: 52 kg  52 kg   Height:       Weight change: -1.2 kg  Physical Examination: General exam: alert awake, elderly and frail older than stated age HEENT:Oral mucosa moist, Ear/Nose WNL grossly Respiratory system: bilaterally clear BS, no use of accessory muscle Cardiovascular system: S1 & S2 +, No JVD. Gastrointestinal system: Abdomen soft,NT,ND, BS+ Nervous System:Alert, awake, moving extremities. Extremities: LE edema neg,distal peripheral pulses palpable.  Skin: No rashes,no icterus. MSK: Normal muscle bulk,tone,  power  Medications reviewed:  Scheduled Meds:  amiodarone  200 mg Oral Daily   aspirin EC  81 mg Oral Daily   atorvastatin  80 mg Oral Daily   enoxaparin (LOVENOX) injection  30 mg Subcutaneous Q24H   furosemide  40 mg Oral Daily   insulin aspart  0-5 Units Subcutaneous QHS   insulin aspart  0-9 Units Subcutaneous  TID WC   isosorbide mononitrate  30 mg Oral Daily   metoprolol succinate  50 mg Oral BID   potassium chloride  40 mEq Oral BID   ticagrelor  60 mg Oral BID  Continuous Infusions:   Diet Order             Diet Heart Room service appropriate? Yes with Assist; Fluid consistency: Thin  Diet effective now                   Intake/Output Summary (Last 24 hours) at 03/16/2023 1017 Last data filed at 03/16/2023 0838 Gross per 24 hour  Intake 220 ml  Output 950 ml  Net -730 ml   Net IO Since Admission: -6,359.39 mL [03/16/23 1017]  Wt Readings from Last 3 Encounters:  03/16/23 52 kg  02/06/23 52.3 kg  12/21/22 57.4 kg     Unresulted Labs (From admission, onward)     Start     Ordered   03/15/23 0500  Basic metabolic panel  Daily,   R     Question:  Specimen collection method  Answer:  Lab=Lab collect   03/14/23 0744   03/15/23 0500  CBC  Daily,   R     Question:  Specimen collection method  Answer:  Lab=Lab collect   03/14/23 0744   03/15/23 0500  Magnesium  Daily,   R     Question:  Specimen collection method  Answer:  Lab=Lab collect   03/14/23 0744   03/12/23 0500  CBC  Daily,   R      03/12/23 0146          Data Reviewed: I have personally reviewed following labs and imaging studies CBC: Recent Labs  Lab 03/12/23 0731 03/13/23 0137 03/14/23 0130 03/15/23 0436 03/16/23 0329  WBC 8.9 8.3 6.8 7.8 7.2  HGB 11.0* 11.4* 11.5* 11.5* 11.7*  HCT 34.4* 36.1 35.9* 37.1 38.8  MCV 92.7 95.5 94.5 97.4 95.8  PLT 228 221 238 235 240   Basic Metabolic Panel: Recent Labs  Lab 03/13/23 0635 03/13/23 1520 03/14/23 0130 03/15/23 0121 03/16/23 0329  NA 137 136 137 137 137  K 2.7* 4.7 4.4 4.7 4.2  CL 96* 99 100 99 102  CO2 27 28 28 27 25   GLUCOSE 94 129* 104* 109* 102*  BUN 18 16 16 22 23   CREATININE 1.04* 1.02* 1.18* 0.95 1.19*  CALCIUM 8.6* 9.1 9.0 9.0 9.2  MG  --   --   --  2.1 2.2   GFR: Estimated Creatinine Clearance: 28.4 mL/min (A) (by C-G formula based  on SCr of 1.19 mg/dL (H)). Liver Function Tests: Recent Labs  Lab 03/11/23 1625  AST 28  ALT 16  ALKPHOS 71  BILITOT 1.2  PROT 7.5  ALBUMIN 3.4*   Recent Labs  Lab 03/11/23 1625  LIPASE 30   Recent Labs  Lab 03/15/23 0814 03/15/23 1153 03/15/23 1539 03/15/23 2040 03/16/23 0624  GLUCAP 86 112* 95 111* 105*   Antimicrobials: Anti-infectives (From admission, onward)    None      Culture/Microbiology  Component Value Date/Time   SDES BLOOD RIGHT FOREARM 11/05/2022 2028   SPECREQUEST  11/05/2022 2028    BOTTLES DRAWN AEROBIC ONLY Blood Culture adequate volume   CULT  11/05/2022 2028    NO GROWTH 5 DAYS Performed at [facility], [address] [place], [place] [address]    REPTSTATUS 11/10/2022 FINAL 11/05/2022 2028    Radiology Studies: No results found.   LOS: 4 days   Lanae Boast, MD [facility]  03/16/2023, 10:17 AM

## 2023-03-16 NOTE — Hospital Course (Signed)
87 year old with history of paroxysmal A-fib status post pacemaker, CAD, diet CHF EF 55%, chronic hypoxia, COPD, CAD comes to the hospital with chest pain and diaphoresis.  Chest x-ray showed small pleural effusion.  Patient was transferred from [facility] to [facility] secondary to elevated troponin/NSTEMI and started on heparin drip-Seen by cardiology, completed 48 hours of heparin.  Currently medical management.  Patient is deconditioned and weak and will need placement TOC has been consulted

## 2023-03-16 NOTE — Care Management Important Message (Signed)
Important Message  Patient Details  Name: [name] MRN: [id number] Date of Birth: 05-May-1936   Medicare Important Message Given:  Yes     Renie Ora 03/16/2023, 9:17 AM

## 2023-03-16 NOTE — TOC Progression Note (Signed)
Transition of Care [facility]) - Progression Note    Patient Details  Name: [name] MRN: [id number] Date of Birth: 01/29/1936  [facility] of [facility]) CM/SW Contact  Leander Rams, LCSW Phone Number: 03/16/2023, 4:25 PM  Clinical Narrative:    CSW called [employer] for update regarding pt insurance auth. Insurance auth still pending.TOC will continue to follow.    Expected Discharge Plan: Skilled Nursing Facility Barriers to Discharge: Continued Medical Work up  Expected Discharge Plan and Services In-house Referral: Clinical Social Work     Living arrangements for the past 2 months: Assisted Living Facility                                       Social Determinants of Health (SDOH) Interventions SDOH Screenings   Food Insecurity: No Food Insecurity (03/12/2023)  Housing: Low Risk  (03/12/2023)  Transportation Needs: No Transportation Needs (03/12/2023)  Utilities: Not At Risk (03/12/2023)  Alcohol Screen: Low Risk  (11/05/2022)  Depression (PHQ2-9): Low Risk  (11/05/2022)  Financial Resource Strain: Low Risk  (11/05/2022)  Physical Activity: Inactive (11/05/2022)  Social Connections: Moderately Integrated (11/05/2022)  Stress: No Stress Concern Present (11/05/2022)  Tobacco Use: Medium Risk (03/11/2023)    Readmission Risk Interventions    07/02/2022   12:34 PM  Readmission Risk Prevention Plan  Transportation Screening Complete  PCP or Specialist Appt within 5-7 Days Complete  Home Care Screening Complete  Medication Review (RN CM) Complete   Oletta Lamas, MSW, LCSWA, LCASA Transitions of Care  Clinical Social Worker I

## 2023-03-16 NOTE — Progress Notes (Signed)
Ok to add Lovenox 30mg  for DVT px while here per Dr. Angus Seller, PharmD, BCIDP, AAHIVP, CPP Infectious Disease Pharmacist 03/16/2023 7:44 AM

## 2023-03-16 NOTE — Plan of Care (Signed)

## 2023-03-17 DIAGNOSIS — I214 Non-ST elevation (NSTEMI) myocardial infarction: Secondary | ICD-10-CM | POA: Diagnosis not present

## 2023-03-17 DIAGNOSIS — Z515 Encounter for palliative care: Secondary | ICD-10-CM

## 2023-03-17 DIAGNOSIS — J9611 Chronic respiratory failure with hypoxia: Secondary | ICD-10-CM | POA: Diagnosis not present

## 2023-03-17 DIAGNOSIS — R079 Chest pain, unspecified: Secondary | ICD-10-CM

## 2023-03-17 DIAGNOSIS — Z7189 Other specified counseling: Secondary | ICD-10-CM

## 2023-03-17 DIAGNOSIS — J449 Chronic obstructive pulmonary disease, unspecified: Secondary | ICD-10-CM | POA: Diagnosis not present

## 2023-03-17 LAB — GLUCOSE, CAPILLARY
Glucose-Capillary: 105 mg/dL — ABNORMAL HIGH (ref 70–99)
Glucose-Capillary: 74 mg/dL (ref 70–99)
Glucose-Capillary: 98 mg/dL (ref 70–99)
Glucose-Capillary: 102 mg/dL — ABNORMAL HIGH (ref 70–99)

## 2023-03-17 LAB — HEMOGLOBIN AND HEMATOCRIT, BLOOD
HCT: 39.4 % (ref 36.0–46.0)
Hemoglobin: 12.2 g/dL (ref 12.0–15.0)

## 2023-03-17 MED ORDER — ENOXAPARIN SODIUM 40 MG/0.4ML IJ SOSY
40.0000 mg | PREFILLED_SYRINGE | INTRAMUSCULAR | Status: DC
  Administered 2023-03-18: 40 mg via SUBCUTANEOUS
  Filled 2023-03-17: qty 0.4

## 2023-03-17 NOTE — TOC Progression Note (Addendum)
Transition of Care [facility]) - Progression Note    Patient Details  Name: [name] MRN: [id number] Date of Birth: Oct 01, 1935  Transition of Care [facility]) CM/SW Contact  Leander Rams, LCSW Phone Number: 03/17/2023, 11:09 AM  Clinical Narrative:    Additional documents uploaded into [employer] per request. Auth still pending.  TOC will continue to follow.  4:00PM Insurance Berkley Harvey for [facility] has been approved. CSW called Debbie with [facility] who confirmed pt can admit tomorrow.   Expected Discharge Plan: Skilled Nursing Facility Barriers to Discharge: Continued Medical Work up  Expected Discharge Plan and Services In-house Referral: Clinical Social Work     Living arrangements for the past 2 months: Assisted Living Facility                                       Social Determinants of Health (SDOH) Interventions SDOH Screenings   Food Insecurity: No Food Insecurity (03/12/2023)  Housing: Low Risk  (03/12/2023)  Transportation Needs: No Transportation Needs (03/12/2023)  Utilities: Not At Risk (03/12/2023)  Alcohol Screen: Low Risk  (11/05/2022)  Depression (PHQ2-9): Low Risk  (11/05/2022)  Financial Resource Strain: Low Risk  (11/05/2022)  Physical Activity: Inactive (11/05/2022)  Social Connections: Moderately Integrated (11/05/2022)  Stress: No Stress Concern Present (11/05/2022)  Tobacco Use: Medium Risk (03/11/2023)    Readmission Risk Interventions    07/02/2022   12:34 PM  Readmission Risk Prevention Plan  Transportation Screening Complete  PCP or Specialist Appt within 5-7 Days Complete  Home Care Screening Complete  Medication Review (RN CM) Complete   Oletta Lamas, MSW, LCSWA, LCASA Transitions of Care  Clinical Social Worker I

## 2023-03-17 NOTE — Progress Notes (Signed)
PROGRESS NOTE [name]  GEX:[id number] DOB: 04-Jun-1936 DOA: 03/11/2023 PCP: [name], MD  Brief Narrative/Hospital Course: 87 year old with history of paroxysmal A-fib status post pacemaker, CAD, diet CHF EF 55%, chronic hypoxia, COPD, CAD comes to the hospital with chest pain and diaphoresis.  Chest x-ray showed small pleural effusion.  Patient was transferred from [facility] to [facility] secondary to elevated troponin/NSTEMI and started on heparin drip-Seen by cardiology, completed 48 hours of heparin.  Currently medical management.  Patient is deconditioned and weak and will need placement TOC has been consulted    Subjective: Patient seen and examined this morning Overnight afebrile, BP stable.   She has no new complaints  Assessment and Plan: Principal Problem:   NSTEMI (non-ST elevated myocardial infarction) (HCC) Active Problems:   COPD (chronic obstructive pulmonary disease) (HCC)   Chronic respiratory failure with hypoxia (HCC)   Chronic systolic heart failure (HCC)   Atrial fibrillation, chronic (HCC)   PPM-Medtronic   Essential hypertension   CAD (coronary artery disease)   CHF (congestive heart failure) (HCC)   NSTEMI  Multivessel CAD s/p PCI to RCA in 2022: Suspect type II MI, seen by cardiology managed conservatively with heparin drip x 48 hours. No chest pain,cardiology signed off. Continue aspirin 81,Lipitor, Brilinta, Imdur, Toprol and oral diuretics   Acute on chronic diastolic CHF, EF 01%: Repeat echo with EF of 55%, mildly reduced RV, no wall motion abnormalities Slightly congested today keep on diuretics on p.o. Lasix monitor intake output  Cont to monitor daily I/O,weight, electrolytes and net balance as below.Keep on  salt/fluid restricted diet and monitor in tele. Net IO Since Admission: -7,019.39 mL [03/17/23 1121].  Her weight is stable Filed Weights   03/16/23 0023 03/16/23 0500 03/17/23 0511  Weight: 52 kg 52 kg 52.3 kg   Severe  hypokalemia: Improved   Chronic respiratory failure with hypoxia COPD: Stable, on chronic O2- 2L. Cont nebs   Atrial fibrillation, chroni S/p PPM: In NSR.Not on anticoagulation due to risk of falls.Continue Toprol-XL and po amio   Essential hypertension Bp stable.  Continue metoprolol Imdur and diuretics  Pancreatic cystic lesion-suspected IPMN's Chronic abdominal pain Dysphagia Schatzki's ring dilated in July 2023: She is followed by Dr. Drema Pry sent as patient request to arrange outpatient follow-up   with associated abdominal pain  DVT prophylaxis: enoxaparin (LOVENOX) injection 40 mg Start: 03/18/23 0830 Code Status:   Code Status: DNR Family Communication: plan of care discussed with patient at bedside. Patient status is: Inpatient because of nstemi Level of care: Telemetry Medical   Dispo: The patient is from: alf            Anticipated disposition: SNF once bed available.  She is medically stable  Objective: Vitals last 24 hrs: Vitals:   03/17/23 0320 03/17/23 0344 03/17/23 0511 03/17/23 0753  BP: 119/63   112/78  Pulse: 65     Resp: (!) 28 20  19   Temp: 97.7 F (36.5 C)   97.8 F (36.6 C)  TempSrc: Oral   Oral  SpO2: 100%   98%  Weight:   52.3 kg   Height:       Weight change: 0.345 kg  Physical Examination: General exam: alert awake, oriented, hard of hearing pleasant communicative HEENT:Oral mucosa moist, Ear/Nose WNL grossly Respiratory system: Bilaterally clear BS,no use of accessory muscle Cardiovascular system: S1 & S2 +, No JVD. Gastrointestinal system: Abdomen soft,NT,ND, BS+ Nervous System: Alert, awake, moving all extremities,and following commands.  Extremities: LE edema neg,distal peripheral pulses palpable and warm.  Skin: No rashes,no icterus. MSK: Normal muscle bulk,tone, power   Medications reviewed:  Scheduled Meds:  amiodarone  200 mg Oral Daily   aspirin EC  81 mg Oral Daily   atorvastatin  80 mg Oral Daily   [START ON  03/18/2023] enoxaparin (LOVENOX) injection  40 mg Subcutaneous Q24H   furosemide  40 mg Oral Daily   insulin aspart  0-5 Units Subcutaneous QHS   insulin aspart  0-9 Units Subcutaneous TID WC   isosorbide mononitrate  30 mg Oral Daily   metoprolol succinate  50 mg Oral BID   potassium chloride  40 mEq Oral BID   ticagrelor  60 mg Oral BID  Continuous Infusions:   Diet Order             Diet Heart Room service appropriate? Yes with Assist; Fluid consistency: Thin  Diet effective now                   Intake/Output Summary (Last 24 hours) at 03/17/2023 1121 Last data filed at 03/17/2023 0630 Gross per 24 hour  Intake 740 ml  Output 1400 ml  Net -660 ml   Net IO Since Admission: -7,019.39 mL [03/17/23 1121]  Wt Readings from Last 3 Encounters:  03/17/23 52.3 kg  02/06/23 52.3 kg  12/21/22 57.4 kg     Unresulted Labs (From admission, onward)     Start     Ordered   03/15/23 0500  Basic metabolic panel  Daily,   R     Question:  Specimen collection method  Answer:  Lab=Lab collect   03/14/23 0744   03/15/23 0500  CBC  Daily,   R     Question:  Specimen collection method  Answer:  Lab=Lab collect   03/14/23 0744   03/15/23 0500  Magnesium  Daily,   R     Question:  Specimen collection method  Answer:  Lab=Lab collect   03/14/23 0744   03/12/23 0500  CBC  Daily,   R      03/12/23 0146          Data Reviewed: I have personally reviewed following labs and imaging studies CBC: Recent Labs  Lab 03/13/23 0137 03/14/23 0130 03/15/23 0436 03/16/23 0329 03/17/23 0422  WBC 8.3 6.8 7.8 7.2 6.2  HGB 11.4* 11.5* 11.5* 11.7* 11.4*  HCT 36.1 35.9* 37.1 38.8 36.9  MCV 95.5 94.5 97.4 95.8 96.3  PLT 221 238 235 240 217   Basic Metabolic Panel: Recent Labs  Lab 03/13/23 1520 03/14/23 0130 03/15/23 0121 03/16/23 0329 03/17/23 0422  NA 136 137 137 137 137  K 4.7 4.4 4.7 4.2 4.4  CL 99 100 99 102 102  CO2 28 28 27 25 24   GLUCOSE 129* 104* 109* 102* 98  BUN 16 16 22  23  24*  CREATININE 1.02* 1.18* 0.95 1.19* 1.07*  CALCIUM 9.1 9.0 9.0 9.2 9.2  MG  --   --  2.1 2.2 2.2   GFR: Estimated Creatinine Clearance: 31.7 mL/min (A) (by C-G formula based on SCr of 1.07 mg/dL (H)). Liver Function Tests: Recent Labs  Lab 03/11/23 1625  AST 28  ALT 16  ALKPHOS 71  BILITOT 1.2  PROT 7.5  ALBUMIN 3.4*   Recent Labs  Lab 03/11/23 1625  LIPASE 30   Recent Labs  Lab 03/16/23 1106 03/16/23 1540 03/16/23 2109 03/17/23 0616 03/17/23 1117  GLUCAP 90 107* 148* 105* 74  Antimicrobials: Anti-infectives (From admission, onward)    None      Culture/Microbiology    Component Value Date/Time   SDES BLOOD RIGHT FOREARM 11/05/2022 2028   SPECREQUEST  11/05/2022 2028    BOTTLES DRAWN AEROBIC ONLY Blood Culture adequate volume   CULT  11/05/2022 2028    NO GROWTH 5 DAYS Performed at [facility], [address] [place], [place] [address]    REPTSTATUS 11/10/2022 FINAL 11/05/2022 2028    Radiology Studies: No results found.   LOS: 5 days   Lanae Boast, MD [facility]  03/17/2023, 11:21 AM
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
## 2023-03-17 NOTE — Plan of Care (Signed)
  Problem: Education: Goal: Ability to describe self-care measures that may prevent or decrease complications (Diabetes Survival Skills Education) will improve Outcome: Progressing   Problem: Coping: Goal: Ability to adjust to condition or change in health will improve Outcome: Progressing   Problem: Fluid Volume: Goal: Ability to maintain a balanced intake and output will improve Outcome: Progressing   Problem: Metabolic: Goal: Ability to maintain appropriate glucose levels will improve Outcome: Progressing   Problem: Nutritional: Goal: Maintenance of adequate nutrition will improve Outcome: Progressing Goal: Progress toward achieving an optimal weight will improve Outcome: Progressing   Problem: Health Behavior/Discharge Planning: Goal: Ability to manage health-related needs will improve Outcome: Progressing   Problem: Clinical Measurements: Goal: Will remain free from infection Outcome: Progressing   Problem: Education: Goal: Ability to describe self-care measures that may prevent or decrease complications (Diabetes Survival Skills Education) will improve Outcome: Progressing   Problem: Coping: Goal: Ability to adjust to condition or change in health will improve Outcome: Progressing   Problem: Fluid Volume: Goal: Ability to maintain a balanced intake and output will improve Outcome: Progressing   Problem: Metabolic: Goal: Ability to maintain appropriate glucose levels will improve Outcome: Progressing   Problem: Nutritional: Goal: Maintenance of adequate nutrition will improve Outcome: Progressing Goal: Progress toward achieving an optimal weight will improve Outcome: Progressing   Problem: Health Behavior/Discharge Planning: Goal: Ability to manage health-related needs will improve Outcome: Progressing   Problem: Clinical Measurements: Goal: Will remain free from infection Outcome: Progressing   Problem: Education: Goal: Ability to describe self-care  measures that may prevent or decrease complications (Diabetes Survival Skills Education) will improve Outcome: Progressing   Problem: Coping: Goal: Ability to adjust to condition or change in health will improve Outcome: Progressing   Problem: Fluid Volume: Goal: Ability to maintain a balanced intake and output will improve Outcome: Progressing   Problem: Metabolic: Goal: Ability to maintain appropriate glucose levels will improve Outcome: Progressing   Problem: Nutritional: Goal: Maintenance of adequate nutrition will improve Outcome: Progressing Goal: Progress toward achieving an optimal weight will improve Outcome: Progressing   Problem: Health Behavior/Discharge Planning: Goal: Ability to manage health-related needs will improve Outcome: Progressing   Problem: Clinical Measurements: Goal: Will remain free from infection Outcome: Progressing   Problem: Education: Goal: Ability to describe self-care measures that may prevent or decrease complications (Diabetes Survival Skills Education) will improve Outcome: Progressing   Problem: Coping: Goal: Ability to adjust to condition or change in health will improve Outcome: Progressing   Problem: Fluid Volume: Goal: Ability to maintain a balanced intake and output will improve Outcome: Progressing   Problem: Metabolic: Goal: Ability to maintain appropriate glucose levels will improve Outcome: Progressing   Problem: Nutritional: Goal: Maintenance of adequate nutrition will improve Outcome: Progressing Goal: Progress toward achieving an optimal weight will improve Outcome: Progressing   Problem: Health Behavior/Discharge Planning: Goal: Ability to manage health-related needs will improve Outcome: Progressing   Problem: Clinical Measurements: Goal: Will remain free from infection Outcome: Progressing

## 2023-03-17 NOTE — Progress Notes (Signed)
Mobility Specialist Progress Note:    03/17/23 1157  Mobility  Activity Transferred from bed to chair  Level of Assistance Minimal assist, patient does 75% or more  Assistive Device Front wheel walker  Distance Ambulated (ft) 6 ft  Activity Response Tolerated well  Mobility Referral Yes  $Mobility charge 1 Mobility  Mobility Specialist Start Time (ACUTE ONLY) 1115  Mobility Specialist Stop Time (ACUTE ONLY) 1130  Mobility Specialist Time Calculation (min) (ACUTE ONLY) 15 min   Pt received in bed, agreeable to transfer to the chair. Pt needed MinA w/ bed mobility and for STS. Ambulated on 2L/min, VSS. No c/o throughout. Situated in chair w/ call bell and personal belongings in reach. All needs met and chair alarm on.  Thompson Grayer Mobility Specialist  Please contact vis Secure Chat or  Rehab Office [phone]

## 2023-03-18 DIAGNOSIS — D490 Neoplasm of unspecified behavior of digestive system: Secondary | ICD-10-CM | POA: Diagnosis not present

## 2023-03-18 DIAGNOSIS — M6281 Muscle weakness (generalized): Secondary | ICD-10-CM | POA: Diagnosis not present

## 2023-03-18 DIAGNOSIS — I509 Heart failure, unspecified: Secondary | ICD-10-CM | POA: Diagnosis not present

## 2023-03-18 DIAGNOSIS — I5022 Chronic systolic (congestive) heart failure: Secondary | ICD-10-CM | POA: Diagnosis not present

## 2023-03-18 DIAGNOSIS — G250 Essential tremor: Secondary | ICD-10-CM | POA: Diagnosis not present

## 2023-03-18 DIAGNOSIS — I214 Non-ST elevation (NSTEMI) myocardial infarction: Secondary | ICD-10-CM | POA: Diagnosis not present

## 2023-03-18 DIAGNOSIS — Z741 Need for assistance with personal care: Secondary | ICD-10-CM | POA: Diagnosis not present

## 2023-03-18 DIAGNOSIS — I2510 Atherosclerotic heart disease of native coronary artery without angina pectoris: Secondary | ICD-10-CM | POA: Diagnosis not present

## 2023-03-18 DIAGNOSIS — R531 Weakness: Secondary | ICD-10-CM | POA: Diagnosis not present

## 2023-03-18 DIAGNOSIS — R278 Other lack of coordination: Secondary | ICD-10-CM | POA: Diagnosis not present

## 2023-03-18 DIAGNOSIS — Z515 Encounter for palliative care: Secondary | ICD-10-CM | POA: Diagnosis not present

## 2023-03-18 DIAGNOSIS — R634 Abnormal weight loss: Secondary | ICD-10-CM | POA: Diagnosis not present

## 2023-03-18 DIAGNOSIS — I4891 Unspecified atrial fibrillation: Secondary | ICD-10-CM | POA: Diagnosis not present

## 2023-03-18 DIAGNOSIS — Z743 Need for continuous supervision: Secondary | ICD-10-CM | POA: Diagnosis not present

## 2023-03-18 DIAGNOSIS — I5032 Chronic diastolic (congestive) heart failure: Secondary | ICD-10-CM | POA: Diagnosis not present

## 2023-03-18 DIAGNOSIS — E785 Hyperlipidemia, unspecified: Secondary | ICD-10-CM | POA: Diagnosis not present

## 2023-03-18 DIAGNOSIS — E440 Moderate protein-calorie malnutrition: Secondary | ICD-10-CM | POA: Diagnosis not present

## 2023-03-18 DIAGNOSIS — R451 Restlessness and agitation: Secondary | ICD-10-CM | POA: Diagnosis not present

## 2023-03-18 LAB — GLUCOSE, CAPILLARY
Glucose-Capillary: 83 mg/dL (ref 70–99)
Glucose-Capillary: 125 mg/dL — ABNORMAL HIGH (ref 70–99)

## 2023-03-18 NOTE — Progress Notes (Signed)
Mobility Specialist Progress Note:    03/18/23 0950  Mobility  Activity Transferred from bed to chair  Level of Assistance Minimal assist, patient does 75% or more  Assistive Device Front wheel walker  Distance Ambulated (ft) 6 ft  Activity Response Tolerated well  Mobility Referral Yes  $Mobility charge 1 Mobility  Mobility Specialist Start Time (ACUTE ONLY) 0900  Mobility Specialist Stop Time (ACUTE ONLY) 0910  Mobility Specialist Time Calculation (min) (ACUTE ONLY) 10 min   Pt received in bed, hesitant but agreeable to participate in session. Pt needed MinA throughout session. They were able to take a couple of steps towards the chair w/ fault. Ambulated on 2L/min, VSS. Situated in chair w/ personal belongings and call bell in reach. All needs met and chair alarm on.    Thompson Grayer Mobility Specialist  Please contact vis Secure Chat or  Rehab Office [phone]

## 2023-03-18 NOTE — TOC Transition Note (Signed)
Transition of Care [facility]) - CM/SW Discharge Note   Patient Details  Name: [name] MRN: [id number] Date of Birth: 17-Dec-1935  [facility] CM/SW Contact:  Leander Rams, LCSW Phone Number: 03/18/2023, 1:58 PM   Clinical Narrative:    Patient will DC to: [facility] Anticipated DC date: 03/18/2023 Family notified: April Transport by: Sharin Mons   Per MD patient ready for DC to [facility]. RN, patient, patient's family, and facility notified of DC. Discharge Summary and FL2 sent to facility. RN to call report prior to discharge [phone]. DC packet on chart. Ambulance transport requested for patient.   CSW will sign off for now as social work intervention is no longer needed. Please consult [place] again if new needs arise.    Final next level of care: Skilled Nursing Facility Barriers to Discharge: No Barriers Identified   Patient Goals and CMS Choice      Discharge Placement                Patient chooses bed at:  [facility]) Patient to be transferred to facility by: [facility] Name of family member notified: April Patient and family notified of of transfer: 03/18/23  Discharge Plan and Services Additional resources added to the After Visit Summary for   In-house Referral: Clinical Social Work                                   Social Determinants of Health (SDOH) Interventions SDOH Screenings   Food Insecurity: No Food Insecurity (03/12/2023)  Housing: Low Risk  (03/12/2023)  Transportation Needs: No Transportation Needs (03/12/2023)  Utilities: Not At Risk (03/12/2023)  Alcohol Screen: Low Risk  (11/05/2022)  Depression (PHQ2-9): Low Risk  (11/05/2022)  Financial Resource Strain: Low Risk  (11/05/2022)  Physical Activity: Inactive (11/05/2022)  Social Connections: Moderately Integrated (11/05/2022)  Stress: No Stress Concern Present (11/05/2022)  Tobacco Use: Medium Risk (03/11/2023)     Readmission Risk Interventions    07/02/2022   12:34 PM   Readmission Risk Prevention Plan  Transportation Screening Complete  PCP or Specialist Appt within 5-7 Days Complete  Home Care Screening Complete  Medication Review (RN CM) Complete     Oletta Lamas, MSW, LCSWA, LCASA Transitions of Care  Clinical Social Worker I

## 2023-03-18 NOTE — Discharge Summary (Signed)
Physician Discharge Summary  [name] UJW:[id number] DOB: 1935-12-30 DOA: 03/11/2023  PCP: [name], MD  Admit date: 03/11/2023 Discharge date: 03/18/2023 Recommendations for Outpatient Follow-up:  Follow up with PCP in 1 weeks-call for appointment Please obtain BMP/CBC in one week F/u Dr Earmon Phoenix w/ GI a  Discharge Dispo: SNF Discharge Condition: Stable Code Status:   Code Status: DNR Diet recommendation:  Diet Order             Diet Heart Room service appropriate? Yes with Assist; Fluid consistency: Thin  Diet effective now                    Brief/Interim Summary: 87 year old with history of paroxysmal A-fib status post pacemaker, CAD, diet CHF EF 55%, chronic hypoxia, COPD, CAD comes to the hospital with chest pain and diaphoresis.  Chest x-ray showed small pleural effusion.  Patient was transferred from [facility] to [facility] secondary to elevated troponin/NSTEMI and started on heparin drip-Seen by cardiology, completed 48 hours of heparin.  Currently medical management.  Patient is deconditioned and weak and will need placement TOC has been consulted  Disposition plan discussed with patient's family/daughter social worker Discharge Diagnoses:  Principal Problem:   NSTEMI (non-ST elevated myocardial infarction) (HCC) Active Problems:   COPD (chronic obstructive pulmonary disease) (HCC)   Chronic respiratory failure with hypoxia (HCC)   Chronic systolic heart failure (HCC)   Atrial fibrillation, chronic (HCC)   PPM-[employer]   Essential hypertension   CAD (coronary artery disease)   CHF (congestive heart failure) (HCC)   NSTEMI  Multivessel CAD s/p PCI to RCA in 2022: Suspect type II MI, seen by cardiology managed conservatively with heparin drip x 48 hours. No chest pain,cardiology signed off. Continue aspirin 81,Lipitor, Brilinta, Imdur, Toprol and oral diuretics   Acute on chronic diastolic CHF, EF 56%: Repeat echo with EF of 55%, mildly reduced  RV, no wall motion abnormalities Slightly congested today keep on diuretics on p.o. Lasix monitor intake output  Cont to monitor daily I/O,weight, electrolytes and net balance as below.Keep on  salt/fluid restricted diet and monitor in tele. Net IO Since Admission: -8,279.39 mL [03/18/23 1131].  Her weight is stable Filed Weights   03/16/23 0500 03/17/23 0511 03/18/23 0415  Weight: 52 kg 52.3 kg 53.2 kg   Severe hypokalemia: Improved   Chronic respiratory failure with hypoxia COPD: Stable, on chronic O2- 2L. Cont nebs   Atrial fibrillation, chroni S/p PPM: In NSR.Not on anticoagulation due to risk of falls.Continue Toprol-XL and po amio  Mild anemia chronic: hemoglobin stable uptrending:PT denies any GI bleeding Recent Labs  Lab 03/16/23 0329 03/17/23 0422 03/17/23 1820 03/18/23 0159 03/18/23 1021  HGB 11.7* 11.4* 12.2 10.7* 11.4*  HCT 38.8 36.9 39.4 35.2* 37.5    Essential hypertension Bp stable.  Continue metoprolol Imdur and diuretics  Pancreatic cystic lesion-suspected IPMN's Chronic abdominal pain Dysphagia Schatzki's ring dilated in July 2023: She is followed by Dr. Drema Pry sent as patient request to arrange outpatient follow-up  Consults: Cardio Gi over secure chat Subjective: Alert and oriented resting comfortably PRN Agreeable for discharge to skilled nursing facility today   Discharge Exam: Vitals:   03/18/23 0415 03/18/23 0750  BP: (!) 116/58 127/67  Pulse: (!) 58   Resp: 20 (!) 22  Temp: 97.8 F (36.6 C) 97.9 F (36.6 C)  SpO2: 100%    General: Pt is alert, awake, not in acute distress Cardiovascular: RRR, S1/S2 +, no rubs, no  gallops Respiratory: CTA bilaterally, no wheezing, no rhonchi Abdominal: Soft, NT, ND, bowel sounds + Extremities: no edema, no cyanosis  Discharge Instructions  Discharge Instructions     (HEART FAILURE PATIENTS) Call MD:  Anytime you have any of the following symptoms: 1) 3 pound weight gain in 24 hours  or 5 pounds in 1 week 2) shortness of breath, with or without a dry hacking cough 3) swelling in the hands, feet or stomach 4) if you have to sleep on extra pillows at night in order to breathe.   Complete by: As directed    Discharge instructions   Complete by: As directed    Please call call MD or return to ER for similar or worsening recurring problem that brought you to hospital or if any fever,nausea/vomiting,abdominal pain, uncontrolled pain, chest pain,  shortness of breath or any other alarming symptoms.  Please follow-up your doctor as instructed in a week time and call the office for appointment.  Please avoid alcohol, smoking, or any other illicit substance and maintain healthy habits including taking your regular medications as prescribed.  You were cared for by a hospitalist during your hospital stay. If you have any questions about your discharge medications or the care you received while you were in the hospital after you are discharged, you can call the unit and ask to speak with the hospitalist on call if the hospitalist that took care of you is not available.  Once you are discharged, your primary care physician will handle any further medical issues. Please note that NO REFILLS for any discharge medications will be authorized once you are discharged, as it is imperative that you return to your primary care physician (or establish a relationship with a primary care physician if you do not have one) for your aftercare needs so that they can reassess your need for medications and monitor your lab values      Allergies as of 03/18/2023       Reactions   Amitriptyline Hcl Other (See Comments)   Caused "jaws to twist and lock"   Plavix [clopidogrel Bisulfate] Hives   Sulfonamide Derivatives Other (See Comments)   UNKNOWN REACTION        Medication List     TAKE these medications    acetaminophen 325 MG tablet Commonly known as: TYLENOL Take 2 tablets (650 mg total) by  mouth every 6 (six) hours as needed for mild pain (or Fever >/= 101).   albuterol 108 (90 Base) MCG/ACT inhaler Commonly known as: VENTOLIN HFA Inhale 1 puff into the lungs every 4 (four) hours as needed for wheezing or shortness of breath.   albuterol (2.5 MG/3ML) 0.083% nebulizer solution Commonly known as: PROVENTIL Take 3 mLs (2.5 mg total) by nebulization every 4 (four) hours as needed for wheezing or shortness of breath.   amiodarone 200 MG tablet Commonly known as: PACERONE Take 1 tablet (200 mg total) by mouth daily. What changed: See the new instructions.   aspirin EC 81 MG tablet Take 1 tablet (81 mg total) by mouth daily with breakfast. Swallow whole.   atorvastatin 80 MG tablet Commonly known as: Lipitor Take 1 tablet (80 mg total) by mouth daily.   bisacodyl 10 MG suppository Commonly known as: DULCOLAX Place 10 mg rectally once.   dicyclomine 10 MG capsule Commonly known as: BENTYL Take 1 capsule (10 mg total) by mouth every 12 (twelve) hours as needed for spasms (abdominal pain). What changed: when to take this  folic acid 1 MG tablet Commonly known as: FOLVITE Take 1 tablet (1 mg total) by mouth daily.   furosemide 40 MG tablet Commonly known as: LASIX Take 1 tablet (40 mg total) by mouth daily.   guaifenesin 100 MG/5ML syrup Commonly known as: ROBITUSSIN Take 200 mg by mouth every 6 (six) hours as needed for cough.   HYDROcodone-acetaminophen 7.5-325 MG tablet Commonly known as: NORCO Take 1 tablet by mouth every 8 (eight) hours as needed for moderate pain.   isosorbide mononitrate 30 MG 24 hr tablet Commonly known as: IMDUR Take 1 tablet (30 mg total) by mouth daily.   LORazepam 0.5 MG tablet Commonly known as: ATIVAN Take 1 tablet (0.5 mg total) by mouth every 8 (eight) hours as needed for anxiety.   melatonin 5 MG Tabs Take 5 mg by mouth at bedtime.   metoprolol succinate 50 MG 24 hr tablet Commonly known as: TOPROL-XL Take 1 tablet (50  mg total) by mouth 2 (two) times daily. Take with or immediately following a meal.   nitroGLYCERIN 0.4 MG SL tablet Commonly known as: NITROSTAT Place 1 tablet (0.4 mg total) under the tongue every 5 (five) minutes x 3 doses as needed for chest pain.   ondansetron 4 MG tablet Commonly known as: ZOFRAN Take 4 mg by mouth every 6 (six) hours as needed for vomiting or nausea.   OXYGEN Inhale 2 L into the lungs as needed (PRN as needed at night).   polyethylene glycol 17 g packet Commonly known as: MIRALAX / GLYCOLAX Take 17 g by mouth daily.   Potassium Chloride ER 20 MEQ Tbcr Take 1 tablet (20 mEq total) by mouth daily. 1 tab daily by mouth   ranolazine 500 MG 12 hr tablet Commonly known as: RANEXA Take 1 tablet (500 mg total) by mouth 2 (two) times daily.   rOPINIRole 0.5 MG tablet Commonly known as: REQUIP Take 0.5 mg by mouth daily.   senna-docusate 8.6-50 MG tablet Commonly known as: Senokot-S Take 2 tablets by mouth at bedtime.   Steglatro 15 MG Tabs tablet Generic drug: ertugliflozin L-PyroglutamicAc Take 15 mg by mouth daily.   ticagrelor 60 MG Tabs tablet Commonly known as: BRILINTA Take 60 mg by mouth 2 (two) times daily.   Vitamin B-12 5000 MCG Subl Take 5,000 mcg by mouth daily.   vitamin E 180 MG (400 UNITS) capsule Take 400 Units by mouth daily.        Follow-up Information     Furth, Cadence H, PA-C Follow up on 04/07/2023.   Specialty: Cardiology Why: Cardiology Hospital Follow-up on 04/07/2023 at 2:30 PM. Contact information: [address] [place] [address] [phone]                Allergies  Allergen Reactions   Amitriptyline Hcl Other (See Comments)    Caused "jaws to twist and lock"   Plavix [Clopidogrel Bisulfate] Hives   Sulfonamide Derivatives Other (See Comments)    UNKNOWN REACTION    The results of significant diagnostics from this hospitalization (including imaging, microbiology, ancillary and laboratory) are  listed below for reference.    Microbiology: No results found for this or any previous visit (from the past 240 hour(s)).  Procedures/Studies: ECHOCARDIOGRAM COMPLETE  Result Date: 03/12/2023    ECHOCARDIOGRAM REPORT   Patient Name:   KIPPY CRUME Date of Exam: 03/12/2023 Medical Rec #:  [id number]    Height:       65.0 in Accession #:  [id number]   Weight:       118.4 lb Date of Birth:  11-14-1935    BSA:          1.583 m Patient Age:    87 years     BP:           120/69 mmHg Patient Gender: F            HR:           62 bpm. Exam Location:  Inpatient Procedure: 2D Echo, Cardiac Doppler and Color Doppler Indications:    CHF-Acute Diastolic I50.31  History:        Patient has prior history of Echocardiogram examinations, most                 recent 03/20/2021. Abnormal ECG and Pacemaker.  Sonographer:    Darlys Gales Referring Phys: Zannie Cove IMPRESSIONS  1. Left ventricular ejection fraction, by estimation, is 55 to 60%. The left ventricle has normal function. The left ventricle has no regional wall motion abnormalities. There is mild asymmetric left ventricular hypertrophy of the basal-septal segment. Left ventricular diastolic parameters are indeterminate.  2. Right ventricular systolic function is mildly reduced. The right ventricular size is normal. There is mildly elevated pulmonary artery systolic pressure. The estimated right ventricular systolic pressure is 38.8 mmHg.  3. Left atrial size was severely dilated.  4. Right atrial size was moderately dilated.  5. The mitral valve is degenerative. Moderate mitral valve regurgitation. No evidence of mitral stenosis. Severe mitral annular calcification.  6. The aortic valve is abnormal. There is mild calcification of the aortic valve. There is moderate thickening of the aortic valve. Aortic valve regurgitation is mild. Aortic valve sclerosis/calcification is present, without any evidence of aortic stenosis.  7. The inferior vena cava is normal in size  with greater than 50% respiratory variability, suggesting right atrial pressure of 3 mmHg. FINDINGS  Left Ventricle: Left ventricular ejection fraction, by estimation, is 55 to 60%. The left ventricle has normal function. The left ventricle has no regional wall motion abnormalities. The left ventricular internal cavity size was normal in size. There is  mild asymmetric left ventricular hypertrophy of the basal-septal segment. Left ventricular diastolic function could not be evaluated due to mitral annular calcification (moderate or greater). Left ventricular diastolic parameters are indeterminate. Right Ventricle: The right ventricular size is normal. No increase in right ventricular wall thickness. Right ventricular systolic function is mildly reduced. There is mildly elevated pulmonary artery systolic pressure. The tricuspid regurgitant velocity  is 2.99 m/s, and with an assumed right atrial pressure of 3 mmHg, the estimated right ventricular systolic pressure is 38.8 mmHg. Left Atrium: Left atrial size was severely dilated. Right Atrium: Right atrial size was moderately dilated. Pericardium: There is no evidence of pericardial effusion. Mitral Valve: The mitral valve is degenerative in appearance. Severe mitral annular calcification. Moderate mitral valve regurgitation. No evidence of mitral valve stenosis. Tricuspid Valve: The tricuspid valve is normal in structure. Tricuspid valve regurgitation is mild . No evidence of tricuspid stenosis. Aortic Valve: The aortic valve is abnormal. There is mild calcification of the aortic valve. There is moderate thickening of the aortic valve. Aortic valve regurgitation is mild. Aortic regurgitation PHT measures 689 msec. Aortic valve sclerosis/calcification is present, without any evidence of aortic stenosis. Aortic valve mean gradient measures 3.0 mmHg. Aortic valve peak gradient measures 7.5 mmHg. Aortic valve area, by VTI measures 1.94 cm. Pulmonic Valve: The pulmonic  valve was normal  in structure. Pulmonic valve regurgitation is mild to moderate. No evidence of pulmonic stenosis. Aorta: The aortic root is normal in size and structure. Venous: The inferior vena cava is normal in size with greater than 50% respiratory variability, suggesting right atrial pressure of 3 mmHg. IAS/Shunts: No atrial level shunt detected by color flow Doppler. Additional Comments: A device lead is visualized in the right atrium and right ventricle.  LEFT VENTRICLE PLAX 2D LVIDd:         4.70 cm   Diastology LVIDs:         3.90 cm   LV e' medial:    5.11 cm/s LV PW:         0.70 cm   LV E/e' medial:  17.8 LV IVS:        1.20 cm   LV e' lateral:   7.62 cm/s LVOT diam:     1.80 cm   LV E/e' lateral: 12.0 LV SV:         57 LV SV Index:   36 LVOT Area:     2.54 cm  RIGHT VENTRICLE RV S prime:     11.20 cm/s TAPSE (M-mode): 2.5 cm LEFT ATRIUM           Index        RIGHT ATRIUM           Index LA Vol (A2C): 73.1 ml 46.18 ml/m  RA Area:     14.70 cm LA Vol (A4C): 93.9 ml 59.32 ml/m  RA Volume:   31.40 ml  19.84 ml/m  AORTIC VALVE AV Area (Vmax):    1.79 cm AV Area (Vmean):   2.13 cm AV Area (VTI):     1.94 cm AV Vmax:           137.00 cm/s AV Vmean:          87.100 cm/s AV VTI:            0.294 m AV Peak Grad:      7.5 mmHg AV Mean Grad:      3.0 mmHg LVOT Vmax:         96.20 cm/s LVOT Vmean:        72.800 cm/s LVOT VTI:          0.224 m LVOT/AV VTI ratio: 0.76 AI PHT:            689 msec  AORTA Ao Root diam: 3.40 cm MITRAL VALVE               TRICUSPID VALVE MV Area (PHT): 2.83 cm    TR Peak grad:   35.8 mmHg MV Decel Time: 268 msec    TR Vmax:        299.00 cm/s MV E velocity: 91.10 cm/s MV A velocity: 35.30 cm/s  SHUNTS MV E/A ratio:  2.58        Systemic VTI:  0.22 m                            Systemic Diam: 1.80 cm Weston Brass MD Electronically signed by Weston Brass MD Signature Date/Time: 03/12/2023/2:46:38 PM    Final    CT ABDOMEN PELVIS W CONTRAST  Result Date: 03/11/2023 CLINICAL  DATA:  Acute generalized abdominal pain. EXAM: CT ABDOMEN AND PELVIS WITH CONTRAST TECHNIQUE: Multidetector CT imaging of the abdomen and pelvis was performed using the standard protocol following bolus administration of intravenous contrast. RADIATION DOSE  REDUCTION: This exam was performed according to the departmental dose-optimization program which includes automated exposure control, adjustment of the mA and/or kV according to patient size and/or use of iterative reconstruction technique. CONTRAST:  OMNIPAQUE IOHEXOL 300 MG/ML  SOLN COMPARISON:  November 24, 2022. FINDINGS: Lower chest: Small bilateral pleural effusions are noted with minimal adjacent subsegmental atelectasis. Hepatobiliary: No focal liver abnormality is seen. Status post cholecystectomy. No biliary dilatation. Pancreas: Stable pancreatic ductal dilatation. No acute inflammation is noted. Spleen: Calcified granulomas are noted in the spleen. Adrenals/Urinary Tract: Adrenal glands are unremarkable. Kidneys are normal, without renal calculi, focal lesion, or hydronephrosis. Bladder is unremarkable. Stomach/Bowel: Stomach is unremarkable. There is no evidence of bowel obstruction or inflammation. Vascular/Lymphatic: Aortic atherosclerosis. No enlarged abdominal or pelvic lymph nodes. Reproductive: Status post hysterectomy. No adnexal masses. Other: No abdominal wall hernia or abnormality. No abdominopelvic ascites. Musculoskeletal: No acute or significant osseous findings. IMPRESSION: Small bilateral pleural effusions with minimal adjacent subsegmental atelectasis. No definite acute abnormality seen in the abdomen or pelvis. Aortic Atherosclerosis (ICD10-I70.0). Electronically Signed   By: Lupita Raider M.D.   On: 03/11/2023 17:52   DG Chest 2 View  Result Date: 03/11/2023 CLINICAL DATA:  Shortness of breath EXAM: CHEST - 2 VIEW COMPARISON:  X-ray 01/19/2023 and older FINDINGS: Enlarged heart with vascular congestion and interstitial  changes. Small effusions. Calcified tortuous aorta. Left upper chest pacemaker. No pneumothorax. Osteopenia with degenerative changes. Surgical Burrs along the right humeral head. IMPRESSION: Enlarged heart with calcified tortuous aorta.  Pacemaker. Developing small effusions with the adjacent opacity and interstitial changes. Recommend follow-up Electronically Signed   By: Karen Kays M.D.   On: 03/11/2023 17:44    Labs: BNP (last 3 results) Recent Labs    11/06/22 0407 03/11/23 1625 03/15/23 0436  BNP 354.0* 1,151.0* 148.5*   Basic Metabolic Panel: Recent Labs  Lab 03/14/23 0130 03/15/23 0121 03/16/23 0329 03/17/23 0422 03/18/23 0159  NA 137 137 137 137 135  K 4.4 4.7 4.2 4.4 4.2  CL 100 99 102 102 103  CO2 28 27 25 24 25   GLUCOSE 104* 109* 102* 98 107*  BUN 16 22 23  24* 24*  CREATININE 1.18* 0.95 1.19* 1.07* 0.95  CALCIUM 9.0 9.0 9.2 9.2 9.0  MG  --  2.1 2.2 2.2 2.2   Liver Function Tests: Recent Labs  Lab 03/11/23 1625  AST 28  ALT 16  ALKPHOS 71  BILITOT 1.2  PROT 7.5  ALBUMIN 3.4*   Recent Labs  Lab 03/11/23 1625  LIPASE 30   No results for input(s): "AMMONIA" in the last 168 hours. CBC: Recent Labs  Lab 03/14/23 0130 03/15/23 0436 03/16/23 0329 03/17/23 0422 03/17/23 1820 03/18/23 0159 03/18/23 1021  WBC 6.8 7.8 7.2 6.2  --  5.9  --   HGB 11.5* 11.5* 11.7* 11.4* 12.2 10.7* 11.4*  HCT 35.9* 37.1 38.8 36.9 39.4 35.2* 37.5  MCV 94.5 97.4 95.8 96.3  --  96.2  --   PLT 238 235 240 217  --  203  --    Recent Labs  Lab 03/17/23 1117 03/17/23 1543 03/17/23 2122 03/18/23 0626 03/18/23 1115  GLUCAP 74 98 102* 83 125*      Component Value Date/Time   COLORURINE STRAW (A) 03/11/2023 1829   APPEARANCEUR CLEAR 03/11/2023 1829   LABSPEC 1.014 03/11/2023 1829   PHURINE 7.0 03/11/2023 1829   GLUCOSEU >=500 (A) 03/11/2023 1829   HGBUR SMALL (A) 03/11/2023 1829   BILIRUBINUR NEGATIVE 03/11/2023  1829   KETONESUR NEGATIVE 03/11/2023 1829   PROTEINUR  NEGATIVE 03/11/2023 1829   UROBILINOGEN 0.2 12/07/2009 1532   NITRITE NEGATIVE 03/11/2023 1829   LEUKOCYTESUR NEGATIVE 03/11/2023 1829   Sepsis Labs Recent Labs  Lab 03/15/23 0436 03/16/23 0329 03/17/23 0422 03/18/23 0159  WBC 7.8 7.2 6.2 5.9   Microbiology No results found for this or any previous visit (from the past 240 hour(s)).   Time coordinating discharge: 35 minutes  SIGNED: Lanae Boast, MD  [facility] 03/18/2023, 11:31 AM  If 7PM-7AM, please contact night-coverage [email or web address]

## 2023-03-18 NOTE — Progress Notes (Signed)
Report given to [place] at [facility] reflecting patient's current status. Legal guardian notified.

## 2023-03-19 DIAGNOSIS — Z515 Encounter for palliative care: Secondary | ICD-10-CM | POA: Diagnosis not present

## 2023-03-19 DIAGNOSIS — R634 Abnormal weight loss: Secondary | ICD-10-CM | POA: Diagnosis not present

## 2023-03-19 DIAGNOSIS — E440 Moderate protein-calorie malnutrition: Secondary | ICD-10-CM | POA: Diagnosis not present

## 2023-03-19 DIAGNOSIS — I5022 Chronic systolic (congestive) heart failure: Secondary | ICD-10-CM | POA: Diagnosis not present

## 2023-03-19 DIAGNOSIS — I5032 Chronic diastolic (congestive) heart failure: Secondary | ICD-10-CM | POA: Diagnosis not present

## 2023-03-19 DIAGNOSIS — I2510 Atherosclerotic heart disease of native coronary artery without angina pectoris: Secondary | ICD-10-CM | POA: Diagnosis not present

## 2023-03-19 DIAGNOSIS — G250 Essential tremor: Secondary | ICD-10-CM | POA: Diagnosis not present

## 2023-03-19 DIAGNOSIS — D490 Neoplasm of unspecified behavior of digestive system: Secondary | ICD-10-CM | POA: Diagnosis not present

## 2023-03-19 DIAGNOSIS — E785 Hyperlipidemia, unspecified: Secondary | ICD-10-CM | POA: Diagnosis not present

## 2023-03-19 DIAGNOSIS — I4891 Unspecified atrial fibrillation: Secondary | ICD-10-CM | POA: Diagnosis not present

## 2023-03-23 DIAGNOSIS — I214 Non-ST elevation (NSTEMI) myocardial infarction: Secondary | ICD-10-CM | POA: Diagnosis not present

## 2023-03-23 DIAGNOSIS — I509 Heart failure, unspecified: Secondary | ICD-10-CM | POA: Diagnosis not present

## 2023-03-23 DIAGNOSIS — M6281 Muscle weakness (generalized): Secondary | ICD-10-CM | POA: Diagnosis not present

## 2023-03-30 DIAGNOSIS — I214 Non-ST elevation (NSTEMI) myocardial infarction: Secondary | ICD-10-CM | POA: Diagnosis not present

## 2023-03-30 DIAGNOSIS — M6281 Muscle weakness (generalized): Secondary | ICD-10-CM | POA: Diagnosis not present

## 2023-03-30 DIAGNOSIS — I509 Heart failure, unspecified: Secondary | ICD-10-CM | POA: Diagnosis not present

## 2023-04-01 DIAGNOSIS — I509 Heart failure, unspecified: Secondary | ICD-10-CM | POA: Diagnosis not present

## 2023-04-01 DIAGNOSIS — I214 Non-ST elevation (NSTEMI) myocardial infarction: Secondary | ICD-10-CM | POA: Diagnosis not present

## 2023-04-01 DIAGNOSIS — M6281 Muscle weakness (generalized): Secondary | ICD-10-CM | POA: Diagnosis not present

## 2023-04-03 DIAGNOSIS — I214 Non-ST elevation (NSTEMI) myocardial infarction: Secondary | ICD-10-CM | POA: Diagnosis not present

## 2023-04-03 DIAGNOSIS — I509 Heart failure, unspecified: Secondary | ICD-10-CM | POA: Diagnosis not present

## 2023-04-03 DIAGNOSIS — M6281 Muscle weakness (generalized): Secondary | ICD-10-CM | POA: Diagnosis not present

## 2023-04-03 DIAGNOSIS — R451 Restlessness and agitation: Secondary | ICD-10-CM | POA: Diagnosis not present

## 2023-04-06 DIAGNOSIS — I10 Essential (primary) hypertension: Secondary | ICD-10-CM | POA: Diagnosis not present

## 2023-04-06 DIAGNOSIS — E538 Deficiency of other specified B group vitamins: Secondary | ICD-10-CM | POA: Diagnosis not present

## 2023-04-06 DIAGNOSIS — E782 Mixed hyperlipidemia: Secondary | ICD-10-CM | POA: Diagnosis not present

## 2023-04-06 DIAGNOSIS — I509 Heart failure, unspecified: Secondary | ICD-10-CM | POA: Diagnosis not present

## 2023-04-06 DIAGNOSIS — J449 Chronic obstructive pulmonary disease, unspecified: Secondary | ICD-10-CM | POA: Diagnosis not present

## 2023-04-07 ENCOUNTER — Ambulatory Visit: Payer: 59 | Attending: Medical | Admitting: Medical

## 2023-04-07 ENCOUNTER — Other Ambulatory Visit (HOSPITAL_COMMUNITY)
Admission: RE | Admit: 2023-04-07 | Discharge: 2023-04-07 | Disposition: A | Discharge status: Home / Self Care | Payer: 59 | Source: Ambulatory Visit | Attending: Medical | Admitting: Medical

## 2023-04-07 ENCOUNTER — Encounter: Payer: Self-pay | Admitting: Medical

## 2023-04-07 VITALS — BP 108/58 | HR 55 | Ht 65.5 in | Wt 123.4 lb

## 2023-04-07 DIAGNOSIS — I2510 Atherosclerotic heart disease of native coronary artery without angina pectoris: Secondary | ICD-10-CM | POA: Diagnosis not present

## 2023-04-07 DIAGNOSIS — I495 Sick sinus syndrome: Secondary | ICD-10-CM | POA: Diagnosis not present

## 2023-04-07 DIAGNOSIS — Z79899 Other long term (current) drug therapy: Secondary | ICD-10-CM | POA: Insufficient documentation

## 2023-04-07 DIAGNOSIS — I214 Non-ST elevation (NSTEMI) myocardial infarction: Secondary | ICD-10-CM

## 2023-04-07 DIAGNOSIS — I480 Paroxysmal atrial fibrillation: Secondary | ICD-10-CM | POA: Insufficient documentation

## 2023-04-07 DIAGNOSIS — I5032 Chronic diastolic (congestive) heart failure: Secondary | ICD-10-CM | POA: Diagnosis not present

## 2023-04-07 LAB — BASIC METABOLIC PANEL
Anion gap: 8 (ref 5–15)
BUN: 13 mg/dL (ref 8–23)
CO2: 29 mmol/L (ref 22–32)
Calcium: 8.7 mg/dL — ABNORMAL LOW (ref 8.9–10.3)
Chloride: 100 mmol/L (ref 98–111)
Creatinine, Ser: 0.95 mg/dL (ref 0.44–1.00)
GFR, Estimated: 59 mL/min — ABNORMAL LOW (ref >60)
Glucose, Bld: 97 mg/dL (ref 70–99)
Potassium: 4.1 mmol/L (ref 3.5–5.1)
Sodium: 137 mmol/L (ref 135–145)

## 2023-04-07 LAB — CBC
HCT: 34.7 % — ABNORMAL LOW (ref 36.0–46.0)
Hemoglobin: 10.7 g/dL — ABNORMAL LOW (ref 12.0–15.0)
MCH: 29.7 pg (ref 26.0–34.0)
MCHC: 30.8 g/dL (ref 30.0–36.0)
MCV: 96.4 fL (ref 80.0–100.0)
Platelets: 201 10*3/uL (ref 150–400)
RBC: 3.6 MIL/uL — ABNORMAL LOW (ref 3.87–5.11)
RDW: 17.2 % — ABNORMAL HIGH (ref 11.5–15.5)
WBC: 5.1 10*3/uL (ref 4.0–10.5)
nRBC: 0 % (ref 0.0–0.2)

## 2023-04-07 NOTE — Patient Instructions (Signed)
Medication Instructions:  Your physician recommends that you continue on your current medications as directed. Please refer to the Current Medication list given to you today.  *If you need a refill on your cardiac medications before your next appointment, please call your pharmacy*   Lab Work: Your physician recommends that you return for lab work in: Today   If you have labs (blood work) drawn today and your tests are completely normal, you will receive your results only by: MyChart Message (if you have MyChart) OR A paper copy in the mail If you have any lab test that is abnormal or we need to change your treatment, we will call you to review the results.   Testing/Procedures: NONE    Follow-Up: At [facility], you and your health needs are our priority.  As part of our continuing mission to provide you with exceptional heart care, we have created designated Provider Care Teams.  These Care Teams include your primary Cardiologist (physician) and Advanced Practice Providers (APPs -  Physician Assistants and Nurse Practitioners) who all work together to provide you with the care you need, when you need it.  We recommend signing up for the patient portal called "MyChart".  Sign up information is provided on this After Visit Summary.  MyChart is used to connect with patients for Virtual Visits (Telemedicine).  Patients are able to view lab/test results, encounter notes, upcoming appointments, etc.  Non-urgent messages can be sent to your provider as well.   To learn more about what you can do with MyChart, go to [email or web address].    Your next appointment:   3 month(s)  Provider:   You may see Nona Dell, MD or one of the following Advanced Practice Providers on your designated Care Team:   Randall An, PA-C  Jacolyn Reedy, PA-C     Other Instructions  Thank you for choosing [facility]!

## 2023-04-07 NOTE — Progress Notes (Signed)
Cardiology Office Note:    Date:  04/08/2023   ID:  [name], DOB 1936-06-10, MRN [id number]  PCP:  [name], MD  [facility] Cardiologist:  Nona Dell, MD  [facility] Electrophysiologist:  Lewayne Bunting, MD   Referring MD: [name], MD   Chief Complaint: Hospital follow-up  History of Present Illness:    [name] is a 87 y.o. female with a hx of CAD s/p PCI x2 to RCA shockwave lithotripsy, PAD (not on a/c due to falls/refusal), sinus node dysfunction, tachybradycardia syndrome s/p PPM followed by EP, mild to mod MR, aortic dilation, COPD, HTN, HLD, h/o pericardial effusion, GERD, hiatal hernia, IPMN, dementia, anemia who is being seen for hospital follow-up.  The patient has known multivessel disease. CABG was deferred due to being a poor surgical candidate. Patient underwent complex PCI and angioplasty of the RCA with ISR in October 2022. She had residual critical left Cx and LAD disease that was medically managed. She has been followed by Dr. Ladona Ridgel for sinus node dysfunction s/p Medtronic PPM due to underlying tachybradycardia syndrome. Currently, she is not on a/c due to refusal/fall for her PAF. In December 2023 patient had a generator change out and normal device check in June. She was admitted in April 2024 with syncope not felt to be cardiac in nature. IT seemed to be consistent with acute metabolic encephalopathy with no stroke. Echo showed LVEF 55-60%, normal RSF, mil to mod MR.   The patient was admitted to [facility] 03/2023 with NSTEMI with troponin up to 200s. She was started on Brilinta, given ASA load and started on IV heparin. CXR showed b/l pleural effusions, she was given IV lasix.  Patient diuresed 8 L.  Repeat echo showed EF of 55%, mildly reduced RV function, no wall motion abnormalities.  No heart cath was pursued due to dementia, age, and other comorbidities. EKG showed SR, home amiodarone was resumed.  Patient completed 48 hours of IV heparin.  She was  continued on aspirin, Lipitor, Brilinta, Imdur, Toprol, and oral diuretics.  Today, the patient is overall doing well. She reports rare chest pain that is brief. She reports breathing is better. She still has some phlegm and congestion. No lower leg edema. She is always in a wheelchair.   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    Essential hypertension    GERD (gastroesophageal reflux disease)    H/O hiatal hernia    Headache(784.0)    HOH (hard of hearing)    Hyperlipidemia    Neuromuscular disorder (HCC)    Tremors   Pericardial effusion    Hemorrhagic    Presence of permanent cardiac pacemaker    Tachycardia-bradycardia syndrome (HCC)    s/p Medtronic Adapta L dual chamber device  5/10    Past Surgical History:  Procedure Laterality Date   APPENDECTOMY     BIOPSY  02/24/2022   Procedure: BIOPSY;  Surgeon: Lemar Lofty., MD;  Location: [facility] ENDOSCOPY;  Service: Gastroenterology;;   CARDIAC CATHETERIZATION  2010   stents x2.   CATARACT EXTRACTION     CHOLECYSTECTOMY     COLONOSCOPY  2011   COLONOSCOPY  N/A 10/19/2014   Procedure: COLONOSCOPY;  Surgeon: Malissa Hippo, MD;  Location: [facility];  Service: Endoscopy;  Laterality: N/A;  1030   CORONARY STENT INTERVENTION N/A 05/28/2021   Procedure: CORONARY STENT INTERVENTION;  Surgeon: Marykay Lex, MD;  Location: [facility] INVASIVE CV LAB;  Service: Cardiovascular;  Laterality: N/A;   CORONARY ULTRASOUND/IVUS N/A 05/28/2021   Procedure: Intravascular Ultrasound/IVUS;  Surgeon: Marykay Lex, MD;  Location: [facility] INVASIVE CV LAB;  Service: Cardiovascular;  Laterality: N/A;   ESOPHAGEAL DILATION N/A 05/02/2020   Procedure: ESOPHAGEAL DILATION;  Surgeon: Malissa Hippo, MD;  [facility];  Service: Gastroenterology;  Laterality: N/A;   ESOPHAGOGASTRODUODENOSCOPY N/A 10/26/2014   Procedure: ESOPHAGOGASTRODUODENOSCOPY (EGD);  Surgeon: Malissa Hippo, MD;  [facility];  Service: Endoscopy;  Laterality: N/A;  230 - Dr. has lunch and learn   ESOPHAGOGASTRODUODENOSCOPY N/A 02/24/2022   Procedure: ESOPHAGOGASTRODUODENOSCOPY (EGD);  Surgeon: Lemar Lofty., MD;  Location: [facility];  Service: Gastroenterology;  Laterality: N/A;   ESOPHAGOGASTRODUODENOSCOPY (EGD) WITH PROPOFOL N/A 05/02/2020   Procedure: ESOPHAGOGASTRODUODENOSCOPY (EGD) WITH PROPOFOL;  Surgeon: Malissa Hippo, MD;  [facility];  Service: Gastroenterology;  Laterality: N/A;  250   Esophagogastroduodenoscopy with esophageal dilation  2004, 2006, 2007   EUS N/A 02/24/2022   Procedure: UPPER ENDOSCOPIC ULTRASOUND (EUS) LINEAR;  Surgeon: Lemar Lofty., MD;  Location: [facility];  Service: Gastroenterology;  Laterality: N/A;   FINE NEEDLE ASPIRATION  02/24/2022   Procedure: FINE NEEDLE ASPIRATION;  Surgeon: Meridee Score Netty Starring., MD;  Location: [facility]OSCOPY;  Service: Gastroenterology;;  red path sent   GIVENS CAPSULE STUDY N/A 10/31/2014   Procedure: GIVENS CAPSULE STUDY;  Surgeon: Malissa Hippo, MD;  [facility];  Service: Endoscopy;  Laterality: N/A;  730 -- pacemaker--needs monitoring--outpatient bed   INSERT / REPLACE / REMOVE PACEMAKER  2010   LEFT HEART CATH AND CORONARY ANGIOGRAPHY N/A 03/21/2021   Procedure: LEFT HEART CATH AND CORONARY ANGIOGRAPHY;  Surgeon: Lyn Records, MD;  Location: [facility] INVASIVE CV LAB;  Service: Cardiovascular;  Laterality: N/A;   LEFT HEART CATHETERIZATION WITH CORONARY ANGIOGRAM N/A 11/17/2011   Procedure: LEFT HEART CATHETERIZATION WITH CORONARY ANGIOGRAM;  Surgeon: Tonny Bollman, MD;  Location: [facility] CATH LAB;  Service: Cardiovascular;  Laterality: N/A;   LEFT HEART CATHETERIZATION WITH CORONARY  ANGIOGRAM N/A 09/26/2014   Procedure: LEFT HEART CATHETERIZATION WITH CORONARY ANGIOGRAM;  Surgeon: Marykay Lex, MD;  Location: [facility] CATH LAB;  Service: Cardiovascular;  Laterality: N/A;   PPM GENERATOR CHANGEOUT N/A 07/16/2022   Procedure: PPM GENERATOR CHANGEOUT;  Surgeon: Marinus Maw, MD;  Location: [facility] INVASIVE CV LAB;  Service: Cardiovascular;  Laterality: N/A;   Right rotator cuff repair     SAVORY DILATION N/A 02/24/2022   Procedure: SAVORY DILATION;  Surgeon: Meridee Score Netty Starring., MD;  Location: [facility];  Service: Gastroenterology;  Laterality: N/A;   SHOULDER ACROMIOPLASTY Right 05/30/2015   Procedure: RIGHT SHOULDER ACROMIOPLASTY;  Surgeon: Vickki Hearing, MD;  Location: [facility];  Service: Orthopedics;  Laterality: Right;   SHOULDER OPEN ROTATOR CUFF REPAIR Right 05/30/2015   Procedure: OPEN ROTATOR CUFF REPAIR RIGHT SHOULDER;  Surgeon: Vickki Hearing, MD;  Location: [facility];  Service: Orthopedics;  Laterality: Right;   SHOULDER OPEN ROTATOR CUFF REPAIR Left 10/22/2016   Procedure: ROTATOR CUFF REPAIR SHOULDER OPEN;  Surgeon: Vickki Hearing, MD;  Location: [facility];  Service: Orthopedics;  Laterality: Left;   Subxiphoid pericardial window  11/10   VAGINAL HYSTERECTOMY      Current Medications: Current Meds  Medication Sig   acetaminophen (TYLENOL) 325 MG tablet Take 2 tablets (650 mg total) by mouth every 6 (six) hours as needed for mild pain (or Fever >/= 101).   albuterol (PROVENTIL) (2.5 MG/3ML) 0.083% nebulizer solution Take 3 mLs (2.5 mg total) by nebulization every 4 (four) hours as needed for wheezing or shortness of breath.   albuterol (VENTOLIN HFA) 108 (90 Base) MCG/ACT inhaler Inhale 1 puff into the lungs every 4 (four) hours as needed for wheezing or shortness of breath.   amiodarone (PACERONE) 200 MG tablet Take 1 tablet (200 mg total) by mouth daily.   aspirin EC 81 MG tablet Take 1 tablet (81 mg total) by mouth daily with breakfast. Swallow whole.    atorvastatin (LIPITOR) 80 MG tablet Take 1 tablet (80 mg total) by mouth daily.   bisacodyl (DULCOLAX) 10 MG suppository Place 10 mg rectally once.   Cyanocobalamin (VITAMIN B-12) 5000 MCG SUBL Take 5,000 mcg by mouth daily.   dicyclomine (BENTYL) 10 MG capsule Take 1 capsule (10 mg total) by mouth every 12 (twelve) hours as needed for spasms (abdominal pain). (Patient taking differently: Take 10 mg by mouth 2 (two) times daily.)   empagliflozin (JARDIANCE) 25 MG TABS tablet Take 25 mg by mouth daily.   ertugliflozin L-PyroglutamicAc (STEGLATRO) 15 MG TABS tablet Take 15 mg by mouth daily.   folic acid (FOLVITE) 1 MG tablet Take 1 tablet (1 mg total) by mouth daily.   furosemide (LASIX) 40 MG tablet Take 1 tablet (40 mg total) by mouth daily.   guaifenesin (ROBITUSSIN) 100 MG/5ML syrup Take 200 mg by mouth every 6 (six) hours as needed for cough.   HYDROcodone-acetaminophen (NORCO) 7.5-325 MG tablet Take 1 tablet by mouth every 8 (eight) hours as needed for moderate pain.   isosorbide mononitrate (IMDUR) 30 MG 24 hr tablet Take 1 tablet (30 mg total) by mouth daily.   LORazepam (ATIVAN) 0.5 MG tablet Take 1 tablet (0.5 mg total) by mouth every 8 (eight) hours as needed for anxiety.   melatonin 5 MG TABS Take 5 mg by mouth at bedtime.   metoprolol succinate (TOPROL-XL) 50 MG 24 hr tablet Take 1 tablet (50 mg total) by mouth 2 (two) times daily. Take with or immediately following a meal.   nitroGLYCERIN (NITROSTAT) 0.4 MG SL tablet Place 1 tablet (0.4 mg total) under the tongue every 5 (five) minutes x 3 doses as needed for chest pain.   ondansetron (ZOFRAN) 4 MG tablet Take 4 mg by mouth every 6 (six) hours as needed for vomiting or nausea.   OXYGEN Inhale 2 L into the lungs as needed (PRN as needed at night).   polyethylene glycol (MIRALAX / GLYCOLAX) 17 g packet Take 17 g by mouth daily.   Potassium Chloride ER 20 MEQ TBCR Take 1 tablet (20 mEq total) by mouth daily. 1 tab daily by mouth    ranolazine (RANEXA) 500 MG 12 hr tablet Take 1 tablet (500 mg total) by mouth 2 (two) times daily.   rOPINIRole (REQUIP) 0.5 MG tablet Take 0.5 mg by mouth daily.   senna-docusate (SENOKOT-S) 8.6-50 MG tablet Take 2 tablets by mouth at bedtime.   ticagrelor (BRILINTA) 60 MG TABS tablet Take 60 mg by mouth 2 (two) times daily.   vitamin E 180 MG (400 UNITS) capsule Take 400 Units by mouth daily.     Allergies:   Amitriptyline hcl,  Plavix [clopidogrel bisulfate], and Sulfonamide derivatives   Social History   Socioeconomic History   Marital status: Widowed    Spouse name: Not on file   Number of children: 1   Years of education: 8   Highest education level: 8th grade  Occupational History   Occupation: Retired    Associate Professor: RETIRED  Tobacco Use   Smoking status: Former    Current packs/day: 0.00    Average packs/day: 2.0 packs/day for 10.0 years (20.0 ttl pk-yrs)    Types: Cigarettes    Start date: 05/24/1979    Quit date: 05/23/1989    Years since quitting: 33.8    Passive exposure: Past   Smokeless tobacco: Never   Tobacco comments:    Verified by Granddaughter, [name]  Vaping Use   Vaping status: Never Used  Substance and Sexual Activity   Alcohol use: No    Alcohol/week: 0.0 standard drinks of alcohol   Drug use: No   Sexual activity: Not Currently    Partners: Male  Other Topics Concern   Not on file  Social History Narrative   She lives alone, has adopted daughter.   Social Determinants of Health   Financial Resource Strain: Low Risk  (11/05/2022)   Overall Financial Resource Strain (CARDIA)    Difficulty of Paying Living Expenses: Not hard at all  Food Insecurity: No Food Insecurity (03/12/2023)   Hunger Vital Sign    Worried About Running Out of Food in the Last Year: Never true    Ran Out of Food in the Last Year: Never true  Transportation Needs: No Transportation Needs (03/12/2023)   PRAPARE - Administrator, Civil Service (Medical): No     Lack of Transportation (Non-Medical): No  Physical Activity: Inactive (11/05/2022)   Exercise Vital Sign    Days of Exercise per Week: 0 days    Minutes of Exercise per Session: 0 min  Stress: No Stress Concern Present (11/05/2022)   [employer] of Occupational Health - Occupational Stress Questionnaire    Feeling of Stress : Not at all  Social Connections: Moderately Integrated (11/05/2022)   Social Connection and Isolation Panel [NHANES]    Frequency of Communication with Friends and Family: More than three times a week    Frequency of Social Gatherings with Friends and Family: More than three times a week    Attends Religious Services: More than 4 times per year    Active Member of [employer] or Organizations: Yes    Attends Banker Meetings: Never    Marital Status: Widowed     Family History: The patient's family history includes Arthritis in an other family member; Asthma in an other family member; Cancer in her mother; Coronary artery disease in her brother and sister; Lung disease in an other family member.  ROS:   Please see the history of present illness.     All other systems reviewed and are negative.  EKGs/Labs/Other Studies Reviewed:    The following studies were reviewed today:  Echo 03/2023 1. Left ventricular ejection fraction, by estimation, is 55 to 60%. The  left ventricle has normal function. The left ventricle has no regional  wall motion abnormalities. There is mild asymmetric left ventricular  hypertrophy of the basal-septal segment.  Left ventricular diastolic parameters are indeterminate.   2. Right ventricular systolic function is mildly reduced. The right  ventricular size is normal. There is mildly elevated pulmonary artery  systolic pressure. The estimated right ventricular systolic  pressure is  38.8 mmHg.   3. Left atrial size was severely dilated.   4. Right atrial size was moderately dilated.   5. The mitral valve is degenerative.  Moderate mitral valve regurgitation.  No evidence of mitral stenosis. Severe mitral annular calcification.   6. The aortic valve is abnormal. There is mild calcification of the  aortic valve. There is moderate thickening of the aortic valve. Aortic  valve regurgitation is mild. Aortic valve sclerosis/calcification is  present, without any evidence of aortic  stenosis.   7. The inferior vena cava is normal in size with greater than 50%  respiratory variability, suggesting right atrial pressure of 3 mmHg.   EKG:  EKG is not ordered today.    Recent Labs: 11/05/2022: TSH 1.218 03/11/2023: ALT 16 03/15/2023: B Natriuretic Peptide 148.5 03/18/2023: Magnesium 2.2 04/07/2023: BUN 13; Creatinine, Ser 0.95; Hemoglobin 10.7; Platelets 201; Potassium 4.1; Sodium 137  Recent Lipid Panel    Component Value Date/Time   CHOL 200 03/20/2021 1605   TRIG 130 03/20/2021 1605   HDL 49 03/20/2021 1605   CHOLHDL 4.1 03/20/2021 1605   VLDL 26 03/20/2021 1605   LDLCALC 125 (H) 03/20/2021 1605     Physical Exam:    VS:  BP (!) 108/58   Pulse (!) 55   Ht 5' 5.5" (1.664 m)   Wt 123 lb 6.4 oz (56 kg)   SpO2 97%   BMI 20.22 kg/m     Wt Readings from Last 3 Encounters:  04/07/23 123 lb 6.4 oz (56 kg)  03/18/23 117 lb 4.6 oz (53.2 kg)  02/06/23 115 lb 6.4 oz (52.3 kg)     GEN:  Well nourished, well developed in no acute distress HEENT: Normal NECK: No JVD; No carotid bruits LYMPHATICS: No lymphadenopathy CARDIAC: RRR, no murmurs, rubs, gallops RESPIRATORY:  Clear to auscultation without rales, wheezing or rhonchi  ABDOMEN: Soft, non-tender, non-distended MUSCULOSKELETAL:  No edema; No deformity  SKIN: Warm and dry NEUROLOGIC:  Alert and oriented x 3 PSYCHIATRIC:  Normal affect   ASSESSMENT:    1. Non-STEMI (non-ST elevated myocardial infarction) (HCC)   2. CAD in native artery   3. Chronic diastolic heart failure (HCC)   4. Tachycardia-bradycardia syndrome (HCC)   5. PAF (paroxysmal atrial  fibrillation) (HCC)   6. Medication management    PLAN:    In order of problems listed above:  NSTEMI CAD with prior complex PCI with shockwave lithotripsy and balloon angioplasty to RCA with ISA. Residual critical left Cx and LAD disease, not felt to be surgical candidate. Recent admission for non-STEMI and acute heart failure. NSTEMi was medically managed with 48 hours of IV heparin. Echo showed EF of 55 to 60%, mild LVH, mild reduced RV function, moderate MR mild AI.   Of note, patient is wheelchair-bound at baseline.  She was started on aspirin, Brilinta, and Imdur 30 mg daily.  Patient reports brief episodes of chest pain.  Soft blood pressure limiting increase in medication.  No changes for today.  I will check a CBC.  Continue Imdur 30 mg daily, Toprol 50 mg twice daily, aspirin 81 mg daily, Brilinta 60 mg twice daily, Lipitor 80 mg daily.  Ranexa was stopped for prolonged Qtc.  Chronic HFpEF Moderate MR Echo during recent hospitalization showed LVEF 55 to 60%, no wall motion abnormalities, mild LVH, mildly reduced RV function, mildly elevated pulmonary systolic pressure, severely dilated left atrium, moderately dilated right atrium, moderate MR, mild AI.  Patient diuresed about  8 L during the hospitalization.  Patient was discharged on Lasix 40 mg daily.  She appears euvolemic on exam.  She denies shortness of breath, orthopnea, PND, lower leg edema.  I will check a BMET today.  SGLT2 inhibitor stopped by either palliative care or PCP prior to hospitalization.  Continue beta-blocker.  Paroxysmal A-fib Patient is not on anticoagulation due to falls and confusion.  Continue Toprol for rate control and amiodarone 200 mg daily.  Tachybradycardia syndrome status post pacemaker Patient is monitored by EP.  Continue annual follow-up.    Disposition: Follow up in 3 month(s) with MD    Signed, Odaly Peri David Stall, PA-C  04/08/2023 10:32 AM    [facility]

## 2023-04-16 ENCOUNTER — Other Ambulatory Visit (HOSPITAL_COMMUNITY)
Admission: RE | Admit: 2023-04-16 | Discharge: 2023-04-16 | Disposition: A | Discharge status: Home / Self Care | Payer: 59 | Source: Ambulatory Visit | Attending: Cardiology | Admitting: Cardiology

## 2023-04-16 ENCOUNTER — Ambulatory Visit (INDEPENDENT_AMBULATORY_CARE_PROVIDER_SITE_OTHER): Payer: 59

## 2023-04-16 DIAGNOSIS — Z79899 Other long term (current) drug therapy: Secondary | ICD-10-CM | POA: Insufficient documentation

## 2023-04-16 DIAGNOSIS — I495 Sick sinus syndrome: Secondary | ICD-10-CM | POA: Diagnosis not present

## 2023-04-16 LAB — HEPATIC FUNCTION PANEL
ALT: 12 U/L (ref 0–44)
AST: 19 U/L (ref 15–41)
Albumin: 3.3 g/dL — ABNORMAL LOW (ref 3.5–5.0)
Alkaline Phosphatase: 71 U/L (ref 38–126)
Bilirubin, Direct: 0.1 mg/dL (ref 0.0–0.2)
Indirect Bilirubin: 0.6 mg/dL (ref 0.3–0.9)
Total Bilirubin: 0.7 mg/dL (ref 0.3–1.2)
Total Protein: 7.3 g/dL (ref 6.5–8.1)

## 2023-04-16 LAB — CUP PACEART REMOTE DEVICE CHECK
Battery Remaining Longevity: 144 mo
Battery Voltage: 3.1 V
Brady Statistic AP VP Percent: 14.6 %
Brady Statistic AP VS Percent: 79.57 %
Brady Statistic AS VP Percent: 0 %
Brady Statistic AS VS Percent: 5.83 %
Brady Statistic RA Percent Paced: 94.14 %
Brady Statistic RV Percent Paced: 14.61 %
Date Time Interrogation Session: 20240912054953
Implantable Lead Connection Status: 753985
Implantable Lead Connection Status: 753985
Implantable Lead Implant Date: 20100526
Implantable Lead Implant Date: 20100526
Implantable Lead Location: 753859
Implantable Lead Location: 753860
Implantable Lead Model: 5076
Implantable Lead Model: 5076
Implantable Pulse Generator Implant Date: 20231213
Lead Channel Impedance Value: 304 Ohm
Lead Channel Impedance Value: 323 Ohm
Lead Channel Impedance Value: 361 Ohm
Lead Channel Impedance Value: 361 Ohm
Lead Channel Pacing Threshold Amplitude: 0.75 V
Lead Channel Pacing Threshold Amplitude: 1.125 V
Lead Channel Pacing Threshold Pulse Width: 0.4 ms
Lead Channel Pacing Threshold Pulse Width: 0.4 ms
Lead Channel Sensing Intrinsic Amplitude: 2.25 mV
Lead Channel Sensing Intrinsic Amplitude: 2.25 mV
Lead Channel Sensing Intrinsic Amplitude: 5.875 mV
Lead Channel Sensing Intrinsic Amplitude: 5.875 mV
Lead Channel Setting Pacing Amplitude: 1.5 V
Lead Channel Setting Pacing Amplitude: 2 V
Lead Channel Setting Pacing Pulse Width: 0.4 ms
Lead Channel Setting Sensing Sensitivity: 1.2 mV
Zone Setting Status: 755011

## 2023-04-16 LAB — BASIC METABOLIC PANEL
Anion gap: 10 (ref 5–15)
BUN: 13 mg/dL (ref 8–23)
CO2: 28 mmol/L (ref 22–32)
Calcium: 9.2 mg/dL (ref 8.9–10.3)
Chloride: 101 mmol/L (ref 98–111)
Creatinine, Ser: 0.84 mg/dL (ref 0.44–1.00)
GFR, Estimated: >60 mL/min (ref >60)
Glucose, Bld: 128 mg/dL — ABNORMAL HIGH (ref 70–99)
Potassium: 3.9 mmol/L (ref 3.5–5.1)
Sodium: 139 mmol/L (ref 135–145)

## 2023-04-16 LAB — LIPID PANEL
Cholesterol: 198 mg/dL (ref 0–200)
HDL: 58 mg/dL (ref >40)
LDL Cholesterol: 110 mg/dL — ABNORMAL HIGH (ref 0–99)
Total CHOL/HDL Ratio: 3.4 ratio
Triglycerides: 152 mg/dL — ABNORMAL HIGH (ref <150)
VLDL: 30 mg/dL (ref 0–40)

## 2023-04-20 ENCOUNTER — Encounter (INDEPENDENT_AMBULATORY_CARE_PROVIDER_SITE_OTHER): Payer: Self-pay | Admitting: Gastroenterology

## 2023-04-20 ENCOUNTER — Ambulatory Visit (INDEPENDENT_AMBULATORY_CARE_PROVIDER_SITE_OTHER): Payer: 59 | Admitting: Gastroenterology

## 2023-04-20 VITALS — BP 116/68 | HR 112 | Temp 97.1°F | Ht 65.5 in | Wt 120.2 lb

## 2023-04-20 DIAGNOSIS — R109 Unspecified abdominal pain: Secondary | ICD-10-CM | POA: Diagnosis not present

## 2023-04-20 DIAGNOSIS — D490 Neoplasm of unspecified behavior of digestive system: Secondary | ICD-10-CM

## 2023-04-20 DIAGNOSIS — I10 Essential (primary) hypertension: Secondary | ICD-10-CM | POA: Diagnosis not present

## 2023-04-20 DIAGNOSIS — K219 Gastro-esophageal reflux disease without esophagitis: Secondary | ICD-10-CM

## 2023-04-20 DIAGNOSIS — D136 Benign neoplasm of pancreas: Secondary | ICD-10-CM | POA: Diagnosis not present

## 2023-04-20 DIAGNOSIS — I509 Heart failure, unspecified: Secondary | ICD-10-CM | POA: Diagnosis not present

## 2023-04-20 DIAGNOSIS — J449 Chronic obstructive pulmonary disease, unspecified: Secondary | ICD-10-CM | POA: Diagnosis not present

## 2023-04-20 DIAGNOSIS — K5900 Constipation, unspecified: Secondary | ICD-10-CM | POA: Diagnosis not present

## 2023-04-20 DIAGNOSIS — E782 Mixed hyperlipidemia: Secondary | ICD-10-CM | POA: Diagnosis not present

## 2023-04-20 DIAGNOSIS — E538 Deficiency of other specified B group vitamins: Secondary | ICD-10-CM | POA: Diagnosis not present

## 2023-04-20 MED ORDER — PANTOPRAZOLE SODIUM 20 MG PO TBEC: 20.0000 mg | DELAYED_RELEASE_TABLET | Freq: Every day | ORAL | 3 refills | Status: DC

## 2023-04-20 NOTE — Patient Instructions (Signed)
Continue docusate 100 mg qday Increase Miralax to three times a day Continue with ondansetron as needed for nausea

## 2023-04-20 NOTE — Progress Notes (Unsigned)
[name], M.D. Gastroenterology & Hepatology [facility] [address] [place], [place] [address]  Primary Care Physician: [name], MD [address] [name] [address]  I will communicate my assessment and recommendations to the referring MD via EMR.  Problems: Mixed branch duct/main duct IPMN Chronic recurrent abdominal pain History of Schatzki's ring  History of Present Illness: [name] is a 87 y.o. female with past medical history of Alzheimer's dementia, COPD, coronary artery disease status post stent placement, [name] syndrome, hypertension, hyperlipidemia, mixed type IPMN, atrial fibrillation, anxiety, who presents for follow up of constipation, IPMN and abdominal pain.   The patient was last seen on 12/08/2022. At that time, the patient was continued on Zofran for nausea management and was started on Bentyl for abdominal pain.  Decision was held to hold off on further imaging for cyst surveillance given her dementia, frailty and comorbidities.  She was referred to palliative care evaluation.  Patient is alone during her encounter, no staff from nursing home is present.  Patient reports that she has still presented issues with constipation since she had her NSTEMI in August.  She was admitted to [facility] in early August, found to have an NSTEMI and acute on chronic diastolic heart failure.  No PCI was performed given comorbidities and frailty.  The patient requested to have follow-up with gastroenterology given her history of pancreatic cysts  Notably, she had a CT scan abd/pelvis with contrast on 03/11/23 which did not show any new abnormalities in her pancreatic ductal dilation or any other intra-abdominal acute abnormalities.  Patient is currently taking Sennokot once a day and Miralax every day. She states that she is moving her bowels every 3 days.  Patient states has nausea every day, does not vomit. She  also reports having pain in her upper abdomen, which radiates to her back. Sometimes she has some blood in her stools when she strains to have a bowel movement.  States having occasional heartburn, but does not take any PPI. Has felt some pills are hard to swallow.  The patient denies having any omiting, fever, chills, hematochezia, melena, hematemesis, abdominal distention, abdominal pain, diarrhea, jaundice, pruritus or weight loss.  Patient has history of an enlarging pancreatic cystic lesion with associated abdominal pain.  Due to this she was referred for endoscopic ultrasound.  Underwent endoscopic ultrasound with [name] on 02/24/2022 with findings described below: A non-obstructing Schatzki ring was found at the gastroesophageal junction. Biopsies were taken with a cold forceps for disruption purposes. No other gross lesions were noted in the entire esophagus. After completion of the EUS, a guidewire was placed and the scope was withdrawn. Dilation was performed with a Savary dilator with mild resistance at 18 mm. The dilation site was examined following endoscope reinsertion and showed mild mucosal disruption/wrent just below the UES, mild improvement in luminal narrowing and no perforation. A 2 cm hiatal hernia was present. Multiple dispersed small erosions with no bleeding and no stigmata of recent bleeding were found in the entire examined stomach. Biopsies were taken with a cold forceps for histology and Helicobacter pylori testing. No other gross lesions were noted in the duodenal bulb, in the first portion of the duodenum and in the second portion of the duodenum. A fishmouth deformity was found at the major papilla.   EUS part: Anechoic lesions suggestive of three cysts were identified in the pancreatic head (1) and genu of the pancreas (2). The  first cyst measured 19 mm by 10 mm in the HOP. The second cyst measured 7 mm by 6 mm in the NOP. The third cyst measured 9 mm by 7 mm in  the NOP. There was no associated masses. Diagnostic needle aspiration for fluid was performed. Color Doppler imaging was utilized prior to needle puncture to confirm a lack of significant vascular structures within the needle path. One pass was made with the Expect 22 gauge needle using a transduodenal approach to the HOP cyst. A stylet was used. The amount of fluid collected was ~1 mL. The fluid was clear and white. Sample was sent for amylase concentration, cytology and CEA. Pancreatic parenchymal abnormalities were noted in the entire pancreas. These consisted of lobularity without honeycombing, cysts and hyperechoic strands. The pancreatic duct had a prominently branched endosonographic appearance and had a tortuous/ectatic appearance as well as some prominence in the pancreatic head (2.7 -> 4.0 mm), genu of the pancreas (3.0 mm), body of the pancreas (3.3 mm) and tail of the pancreas (2.4 mm).  There was no sign of significant endosonographic abnormality in the common bile duct (4.0 mm)and in the common hepatic duct (7.5 mm). Endosonographic imaging of the ampulla showed no intramural (subepithelial) lesion. No malignant-appearing lymph nodes were visualized in the celiac region (level 20), peripancreatic region and porta hepatis region. Endosonographic imaging in the visualized portion of the liver showed no mass.   NOTE: could not get the Linear EUS to pass into the short position from D2 and thus could not evaluate the Uncinate Process of the Pancreas.   CEA 22 , amylase 21,144, cytology showed acellular specimen   Her case was discussed at multidisciplinary tumor board.  It was considered that she was presenting changes concerning for a mixed branch duct/main duct IPMN.  It was considered that given her medical comorbidities she would not be the best surgical candidate but she will continue monitoring.  It was decided that she should have a repeat CT pancreas protocol in 6 months.  Depending  on the findings on imaging, may need to discuss again the benefits versus risk of undergoing surgical resection.    Notably, the patient underwent a CT of the abdomen with and without IV contrast on 05/01/2022 which showed decrease size of the cystic lesions of the pancreatic head and uncinate process measuring up to 15 mm.  She was advised to have a repeat CT pancreas protocol in 6 months.    The patient had a repeat CT pancreas protocol on 10/07/2022 which showed stable size of cystic lesions measuring up to 17 mm in the central pancreatic head proximity to the portal confluence.  Compared to the previous study, the largest lesion was 2 mm larger.  Past Medical History: 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    Essential hypertension    GERD (gastroesophageal reflux disease)    H/O hiatal hernia    Headache(784.0)    HOH (hard of hearing)    Hyperlipidemia    Neuromuscular disorder (HCC)    Tremors   Pericardial effusion    Hemorrhagic    Presence of permanent cardiac pacemaker    Tachycardia-bradycardia syndrome (HCC)  s/p Medtronic Adapta L dual chamber device  5/10    Past Surgical History: Past Surgical History:  Procedure Laterality Date   APPENDECTOMY     BIOPSY  02/24/2022   Procedure: BIOPSY;  Surgeon: Meridee Score Netty Starring., MD;  Location: [facility];  Service: Gastroenterology;;   CARDIAC CATHETERIZATION  2010   stents x2.   CATARACT EXTRACTION     CHOLECYSTECTOMY     COLONOSCOPY  2011   COLONOSCOPY N/A 10/19/2014   Procedure: COLONOSCOPY;  Surgeon: Malissa Hippo, MD;  Location: [facility];  Service: Endoscopy;  Laterality: N/A;  1030   CORONARY STENT INTERVENTION N/A 05/28/2021   Procedure: CORONARY  STENT INTERVENTION;  Surgeon: Marykay Lex, MD;  Location: [facility] INVASIVE CV LAB;  Service: Cardiovascular;  Laterality: N/A;   CORONARY ULTRASOUND/IVUS N/A 05/28/2021   Procedure: Intravascular Ultrasound/IVUS;  Surgeon: Marykay Lex, MD;  Location: [facility] INVASIVE CV LAB;  Service: Cardiovascular;  Laterality: N/A;   ESOPHAGEAL DILATION N/A 05/02/2020   Procedure: ESOPHAGEAL DILATION;  Surgeon: Malissa Hippo, MD;  [facility]: [facility];  Service: Gastroenterology;  Laterality: N/A;   ESOPHAGOGASTRODUODENOSCOPY N/A 10/26/2014   Procedure: ESOPHAGOGASTRODUODENOSCOPY (EGD);  Surgeon: Malissa Hippo, MD;  Location: [facility];  Service: Endoscopy;  Laterality: N/A;  230 - Dr. has lunch and learn   ESOPHAGOGASTRODUODENOSCOPY N/A 02/24/2022   Procedure: ESOPHAGOGASTRODUODENOSCOPY (EGD);  Surgeon: Lemar Lofty., MD;  Location: [facility];  Service: Gastroenterology;  Laterality: N/A;   ESOPHAGOGASTRODUODENOSCOPY (EGD) WITH PROPOFOL N/A 05/02/2020   Procedure: ESOPHAGOGASTRODUODENOSCOPY (EGD) WITH PROPOFOL;  Surgeon: Malissa Hippo, MD;  [facility]: [facility];  Service: Gastroenterology;  Laterality: N/A;  250   Esophagogastroduodenoscopy with esophageal dilation  2004, 2006, 2007   EUS N/A 02/24/2022   Procedure: UPPER ENDOSCOPIC ULTRASOUND (EUS) LINEAR;  Surgeon: Lemar Lofty., MD;  Location: [facility];  Service: Gastroenterology;  Laterality: N/A;   FINE NEEDLE ASPIRATION  02/24/2022   Procedure: FINE NEEDLE ASPIRATION;  Surgeon: Meridee Score Netty Starring., MD;  Location: [facility];  Service: Gastroenterology;;  red path sent   GIVENS CAPSULE STUDY N/A 10/31/2014   Procedure: GIVENS CAPSULE STUDY;  Surgeon: Malissa Hippo, MD;  Location: [facility];  Service: Endoscopy;  Laterality: N/A;  730 -- pacemaker--needs monitoring--outpatient bed   INSERT / REPLACE / REMOVE PACEMAKER  2010   LEFT HEART CATH AND CORONARY ANGIOGRAPHY N/A 03/21/2021   Procedure: LEFT HEART  CATH AND CORONARY ANGIOGRAPHY;  Surgeon: Lyn Records, MD;  Location: [facility] INVASIVE CV LAB;  Service: Cardiovascular;  Laterality: N/A;   LEFT HEART CATHETERIZATION WITH CORONARY ANGIOGRAM N/A 11/17/2011   Procedure: LEFT HEART CATHETERIZATION WITH CORONARY ANGIOGRAM;  Surgeon: Tonny Bollman, MD;  Location[facility] CATH LAB;  Service: Cardiovascular;  Laterality: N/A;   LEFT HEART CATHETERIZATION WITH CORONARY ANGIOGRAM N/A 09/26/2014   Procedure: LEFT HEART CATHETERIZATION WITH CORONARY ANGIOGRAM;  Surgeon: Marykay Lex, MD;  Location: [facility] CATH LAB;  Service: Cardiovascular;  Laterality: N/A;   PPM GENERATOR CHANGEOUT N/A 07/16/2022   Procedure: PPM GENERATOR CHANGEOUT;  Surgeon: Marinus Maw, MD;  Location: [facility] INVASIVE CV LAB;  Service: Cardiovascular;  Laterality: N/A;   Right rotator cuff repair     SAVORY DILATION N/A 02/24/2022   Procedure: SAVORY DILATION;  Surgeon: Meridee Score Netty Starring., MD;  Location: [facility];  Service: Gastroenterology;  Laterality: N/A;   SHOULDER ACROMIOPLASTY Right 05/30/2015   Procedure: RIGHT SHOULDER ACROMIOPLASTY;  Surgeon: Vickki Hearing, MD;  Location: [facility];  Service: Orthopedics;  Laterality: Right;   SHOULDER OPEN ROTATOR CUFF REPAIR Right 05/30/2015   Procedure: OPEN ROTATOR CUFF REPAIR RIGHT SHOULDER;  Surgeon: Vickki Hearing, MD;  Location: [facility];  Service: Orthopedics;  Laterality: Right;   SHOULDER OPEN ROTATOR CUFF REPAIR Left 10/22/2016   Procedure: ROTATOR CUFF REPAIR SHOULDER OPEN;  Surgeon: Vickki Hearing, MD;  Location: [facility];  Service: Orthopedics;  Laterality: Left;   Subxiphoid pericardial window  11/10   VAGINAL HYSTERECTOMY      Family History: Family History  Problem Relation Age of Onset   Cancer Mother        Colon    Coronary artery disease Sister    Coronary artery disease Brother    Arthritis Other    Lung disease Other    Asthma Other     Social History: Social History   Tobacco Use  Smoking Status  Former   Current packs/day: 0.00   Average packs/day: 2.0 packs/day for 10.0 years (20.0 ttl pk-yrs)   Types: Cigarettes   Start date: 05/24/1979   Quit date: 05/23/1989   Years since quitting: 33.9   Passive exposure: Past  Smokeless Tobacco Never  Tobacco Comments   Verified by Granddaughter, [name]   Social History   Substance and Sexual Activity  Alcohol Use No   Alcohol/week: 0.0 standard drinks of alcohol   Social History   Substance and Sexual Activity  Drug Use No    Allergies: Allergies  Allergen Reactions   Amitriptyline Hcl Other (See Comments)    Caused "jaws to twist and lock"   Plavix [Clopidogrel Bisulfate] Hives   Sulfonamide Derivatives Other (See Comments)    UNKNOWN REACTION    Medications: Current Outpatient Medications  Medication Sig Dispense Refill   acetaminophen (TYLENOL) 325 MG tablet Take 2 tablets (650 mg total) by mouth every 6 (six) hours as needed for mild pain (or Fever >/= 101). 100 tablet 1   albuterol (PROVENTIL) (2.5 MG/3ML) 0.083% nebulizer solution Take 3 mLs (2.5 mg total) by nebulization every 4 (four) hours as needed for wheezing or shortness of breath. 75 mL 2   albuterol (VENTOLIN HFA) 108 (90 Base) MCG/ACT inhaler Inhale 1 puff into the lungs every 4 (four) hours as needed for wheezing or shortness of breath.     amiodarone (PACERONE) 200 MG tablet Take 1 tablet (200 mg total) by mouth daily.     aspirin EC 81 MG tablet Take 1 tablet (81 mg total) by mouth daily with breakfast. Swallow whole. 30 tablet 12   atorvastatin (LIPITOR) 80 MG tablet Take 1 tablet (80 mg total) by mouth daily. 90 tablet 1   bisacodyl (DULCOLAX) 10 MG suppository Place 10 mg rectally once.     Cyanocobalamin (VITAMIN B-12) 5000 MCG SUBL Take 5,000 mcg by mouth daily.     dicyclomine (BENTYL) 10 MG capsule Take 1 capsule (10 mg total) by mouth every 12 (twelve) hours as needed for spasms (abdominal pain). (Patient taking differently: Take 10 mg by  mouth 2 (two) times daily.) 90 capsule 1   ertugliflozin L-PyroglutamicAc (STEGLATRO) 15 MG TABS tablet Take 15 mg by mouth daily.     folic acid (FOLVITE) 1 MG tablet Take 1 tablet (1 mg total) by mouth daily.     furosemide (LASIX) 40 MG tablet Take 1 tablet (40 mg total) by mouth daily.     isosorbide mononitrate (IMDUR) 30 MG 24 hr tablet Take 1 tablet (30 mg total) by mouth daily.  LORazepam (ATIVAN) 0.5 MG tablet Take 1 tablet (0.5 mg total) by mouth every 8 (eight) hours as needed for anxiety. (Patient taking differently: Take 0.5 mg by mouth every 6 (six) hours as needed for anxiety.) 10 tablet 0   melatonin 5 MG TABS Take 5 mg by mouth at bedtime.     metoprolol succinate (TOPROL-XL) 50 MG 24 hr tablet Take 1 tablet (50 mg total) by mouth 2 (two) times daily. Take with or immediately following a meal.     nitroGLYCERIN (NITROSTAT) 0.4 MG SL tablet Place 1 tablet (0.4 mg total) under the tongue every 5 (five) minutes x 3 doses as needed for chest pain. 30 tablet 12   ondansetron (ZOFRAN) 4 MG tablet Take 4 mg by mouth every 6 (six) hours as needed for vomiting or nausea.     OXYGEN Inhale 2 L into the lungs as needed (PRN as needed at night).     polyethylene glycol (MIRALAX / GLYCOLAX) 17 g packet Take 17 g by mouth daily.     Potassium Chloride ER 20 MEQ TBCR Take 1 tablet (20 mEq total) by mouth daily. 1 tab daily by mouth 90 tablet 2   ranolazine (RANEXA) 500 MG 12 hr tablet Take 1 tablet (500 mg total) by mouth 2 (two) times daily. 60 tablet 8   rOPINIRole (REQUIP) 0.5 MG tablet Take 0.5 mg by mouth daily.     senna-docusate (SENOKOT-S) 8.6-50 MG tablet Take 2 tablets by mouth at bedtime. 120 tablet 1   ticagrelor (BRILINTA) 60 MG TABS tablet Take 60 mg by mouth 2 (two) times daily.     vitamin E 180 MG (400 UNITS) capsule Take 400 Units by mouth daily.     empagliflozin (JARDIANCE) 25 MG TABS tablet Take 25 mg by mouth daily.     guaifenesin (ROBITUSSIN) 100 MG/5ML syrup Take 200  mg by mouth every 6 (six) hours as needed for cough.     HYDROcodone-acetaminophen (NORCO) 7.5-325 MG tablet Take 1 tablet by mouth every 8 (eight) hours as needed for moderate pain.     No current facility-administered medications for this visit.    Review of Systems: GENERAL: negative for malaise, night sweats HEENT: No changes in hearing or vision, no nose bleeds or other nasal problems. NECK: Negative for lumps, goiter, pain and significant neck swelling RESPIRATORY: Negative for cough, wheezing CARDIOVASCULAR: Negative for chest pain, leg swelling, palpitations, orthopnea GI: SEE HPI MUSCULOSKELETAL: Negative for joint pain or swelling, back pain, and muscle pain. SKIN: Negative for lesions, rash PSYCH: Negative for sleep disturbance, mood disorder and recent psychosocial stressors. HEMATOLOGY Negative for prolonged bleeding, bruising easily, and swollen nodes. ENDOCRINE: Negative for cold or heat intolerance, polyuria, polydipsia and goiter. NEURO: negative for tremor, gait imbalance, syncope and seizures. The remainder of the review of systems is noncontributory.   Physical Exam: BP 116/68 (BP Location: Left Arm, Patient Position: Sitting, Cuff Size: Normal)   Pulse (!) 112   Temp (!) 97.1 F (36.2 C) (Temporal)   Ht 5' 5.5" (1.664 m)   Wt 120 lb 3.2 oz (54.5 kg)   BMI 19.70 kg/m  GENERAL: The patient is AO x2-3 but forgetful, in no acute distress.  Sitting in wheelchair.  Looks frail and weak. HEENT: Head is normocephalic and atraumatic. EOMI are intact. Mouth is well hydrated and without lesions. NECK: Supple. No masses LUNGS: Clear to auscultation. No presence of rhonchi/wheezing/rales. Adequate chest expansion HEART: RRR, normal s1 and s2. ABDOMEN: Soft, nontender, no  guarding, no peritoneal signs, and nondistended. BS +. No masses. EXTREMITIES: Without any cyanosis, clubbing, rash, lesions or edema. NEUROLOGIC: Aox2-3, no focal motor deficit. SKIN: no jaundice, no  rashes  Imaging/Labs: as above  I personally reviewed and interpreted the available labs, imaging and endoscopic files.  Impression and Plan: LACI PFENDER is a 87 y.o. female with past medical history of Alzheimer's dementia, COPD, coronary artery disease status post stent placement, [name] syndrome, hypertension, hyperlipidemia, mixed type IPMN, atrial fibrillation, anxiety, who presents for follow up of constipation, IPMN and abdominal pain.  Patient has presented new onset of constipation after recent hospitalization.  Has not presented any rectal bleeding or other red flag signs.  She is currently on an over-the-counter bowel regimen without complete resolution of her constipation.  I discussed with the patient the possibility of increasing her MiraLAX to 3 times a day along with continue with her docusate.  It is likely her recent cardiac decompensation has led to worsening of her bowel frequency.  Finally, we discussed again the fact that she has multiple comorbidities and is very frail which will make her a very poor surgical candidate if she were to develop a malignancy in her abdomen.  Due to this, continuing with cross-sectional abdominal imaging surveillance will not provide any benefit.  I called [name] (legal guardian) and discussed this again, as this was discussed in her previous visit.  April agreed again that she will only like to pursue minimal interventions for Nahomi and would like to hold off on abdominal imaging surveillance.  -Continue docusate 100 mg qday -Increase Miralax to three times a day -Continue with ondansetron as needed for nausea  All questions were answered.      Katrina Blazing, MD Gastroenterology and Hepatology [facility] Gastroenterology

## 2023-04-28 NOTE — Progress Notes (Signed)
Remote pacemaker transmission.   

## 2023-04-30 DIAGNOSIS — I5022 Chronic systolic (congestive) heart failure: Secondary | ICD-10-CM | POA: Diagnosis not present

## 2023-04-30 DIAGNOSIS — Z515 Encounter for palliative care: Secondary | ICD-10-CM | POA: Diagnosis not present

## 2023-05-06 NOTE — Progress Notes (Signed)
     Brief progress note:   Initial palliative consult completed yesterday 8/13. Note patient is discharging today back to SNF.   Chart reviewed. In the media tab, there is a document granting  Interim Guardianship to [name] until 12/03/22.   I spoke with granddaughter/April by phone. She confirms that she is patient's legal guardian and has provided a copy of this document to the SNF. I requested that she also provide a copy of the document to the hospital when able.    Sherlean Foot, NP-C Palliative Medicine   Please call Palliative Medicine team phone with any questions [phone]. For individual providers please see AMION.   No charge

## 2023-05-13 DIAGNOSIS — L600 Ingrowing nail: Secondary | ICD-10-CM | POA: Diagnosis not present

## 2023-05-13 DIAGNOSIS — M79674 Pain in right toe(s): Secondary | ICD-10-CM | POA: Diagnosis not present

## 2023-05-19 ENCOUNTER — Encounter: Payer: 59 | Admitting: Internal Medicine

## 2023-05-21 DIAGNOSIS — I5022 Chronic systolic (congestive) heart failure: Secondary | ICD-10-CM | POA: Diagnosis not present

## 2023-05-21 DIAGNOSIS — I2510 Atherosclerotic heart disease of native coronary artery without angina pectoris: Secondary | ICD-10-CM | POA: Diagnosis not present

## 2023-05-21 DIAGNOSIS — G250 Essential tremor: Secondary | ICD-10-CM | POA: Diagnosis not present

## 2023-05-21 DIAGNOSIS — I482 Chronic atrial fibrillation, unspecified: Secondary | ICD-10-CM | POA: Diagnosis not present

## 2023-05-21 DIAGNOSIS — E785 Hyperlipidemia, unspecified: Secondary | ICD-10-CM | POA: Diagnosis not present

## 2023-05-21 DIAGNOSIS — I10 Essential (primary) hypertension: Secondary | ICD-10-CM | POA: Diagnosis not present

## 2023-05-25 DIAGNOSIS — I482 Chronic atrial fibrillation, unspecified: Secondary | ICD-10-CM | POA: Diagnosis not present

## 2023-05-25 DIAGNOSIS — I10 Essential (primary) hypertension: Secondary | ICD-10-CM | POA: Diagnosis not present

## 2023-05-25 DIAGNOSIS — I5032 Chronic diastolic (congestive) heart failure: Secondary | ICD-10-CM | POA: Diagnosis not present

## 2023-05-28 DIAGNOSIS — R279 Unspecified lack of coordination: Secondary | ICD-10-CM | POA: Diagnosis not present

## 2023-05-28 DIAGNOSIS — M6281 Muscle weakness (generalized): Secondary | ICD-10-CM | POA: Diagnosis not present

## 2023-05-28 DIAGNOSIS — J449 Chronic obstructive pulmonary disease, unspecified: Secondary | ICD-10-CM | POA: Diagnosis not present

## 2023-05-28 DIAGNOSIS — I214 Non-ST elevation (NSTEMI) myocardial infarction: Secondary | ICD-10-CM | POA: Diagnosis not present

## 2023-05-29 DIAGNOSIS — R279 Unspecified lack of coordination: Secondary | ICD-10-CM | POA: Diagnosis not present

## 2023-05-29 DIAGNOSIS — J449 Chronic obstructive pulmonary disease, unspecified: Secondary | ICD-10-CM | POA: Diagnosis not present

## 2023-05-29 DIAGNOSIS — I214 Non-ST elevation (NSTEMI) myocardial infarction: Secondary | ICD-10-CM | POA: Diagnosis not present

## 2023-05-29 DIAGNOSIS — M6281 Muscle weakness (generalized): Secondary | ICD-10-CM | POA: Diagnosis not present

## 2023-06-01 DIAGNOSIS — R279 Unspecified lack of coordination: Secondary | ICD-10-CM | POA: Diagnosis not present

## 2023-06-01 DIAGNOSIS — M6281 Muscle weakness (generalized): Secondary | ICD-10-CM | POA: Diagnosis not present

## 2023-06-01 DIAGNOSIS — J449 Chronic obstructive pulmonary disease, unspecified: Secondary | ICD-10-CM | POA: Diagnosis not present

## 2023-06-01 DIAGNOSIS — I214 Non-ST elevation (NSTEMI) myocardial infarction: Secondary | ICD-10-CM | POA: Diagnosis not present

## 2023-06-02 DIAGNOSIS — M6281 Muscle weakness (generalized): Secondary | ICD-10-CM | POA: Diagnosis not present

## 2023-06-02 DIAGNOSIS — R279 Unspecified lack of coordination: Secondary | ICD-10-CM | POA: Diagnosis not present

## 2023-06-02 DIAGNOSIS — J449 Chronic obstructive pulmonary disease, unspecified: Secondary | ICD-10-CM | POA: Diagnosis not present

## 2023-06-02 DIAGNOSIS — I214 Non-ST elevation (NSTEMI) myocardial infarction: Secondary | ICD-10-CM | POA: Diagnosis not present

## 2023-06-04 DIAGNOSIS — I5032 Chronic diastolic (congestive) heart failure: Secondary | ICD-10-CM | POA: Diagnosis not present

## 2023-06-04 DIAGNOSIS — J449 Chronic obstructive pulmonary disease, unspecified: Secondary | ICD-10-CM | POA: Diagnosis not present

## 2023-06-04 DIAGNOSIS — I4891 Unspecified atrial fibrillation: Secondary | ICD-10-CM | POA: Diagnosis not present

## 2023-06-04 DIAGNOSIS — R279 Unspecified lack of coordination: Secondary | ICD-10-CM | POA: Diagnosis not present

## 2023-06-04 DIAGNOSIS — D490 Neoplasm of unspecified behavior of digestive system: Secondary | ICD-10-CM | POA: Diagnosis not present

## 2023-06-04 DIAGNOSIS — I214 Non-ST elevation (NSTEMI) myocardial infarction: Secondary | ICD-10-CM | POA: Diagnosis not present

## 2023-06-04 DIAGNOSIS — J9610 Chronic respiratory failure, unspecified whether with hypoxia or hypercapnia: Secondary | ICD-10-CM | POA: Diagnosis not present

## 2023-06-04 DIAGNOSIS — M6281 Muscle weakness (generalized): Secondary | ICD-10-CM | POA: Diagnosis not present

## 2023-06-04 DIAGNOSIS — Z9981 Dependence on supplemental oxygen: Secondary | ICD-10-CM | POA: Diagnosis not present

## 2023-06-04 DIAGNOSIS — R52 Pain, unspecified: Secondary | ICD-10-CM | POA: Diagnosis not present

## 2023-06-05 ENCOUNTER — Encounter: Payer: Self-pay | Admitting: Internal Medicine

## 2023-06-05 ENCOUNTER — Ambulatory Visit: Payer: 59 | Attending: Internal Medicine | Admitting: Internal Medicine

## 2023-06-05 VITALS — BP 124/62 | HR 75 | Ht 65.5 in | Wt 123.0 lb

## 2023-06-05 DIAGNOSIS — I480 Paroxysmal atrial fibrillation: Secondary | ICD-10-CM | POA: Diagnosis not present

## 2023-06-05 LAB — CUP PACEART INCLINIC DEVICE CHECK
Date Time Interrogation Session: 20241101095516
Implantable Lead Connection Status: 753985
Implantable Lead Connection Status: 753985
Implantable Lead Implant Date: 20100526
Implantable Lead Implant Date: 20100526
Implantable Lead Location: 753859
Implantable Lead Location: 753860
Implantable Lead Model: 5076
Implantable Lead Model: 5076
Implantable Pulse Generator Implant Date: 20231213

## 2023-06-05 NOTE — Progress Notes (Signed)
HPI [name] returns today for followup. She is a pleasant 87 yo woman with a h/o HTN, persistent atrial fib, symptomatic tachy-brady syndrome s/p PPM insertion. In the interim, she has been in the hospital with an NSTEMI managed conservatively. She has minimal palpitations. She has not had syncope. She has no edema. Her tremor persists. Allergies  Allergen Reactions   Amitriptyline Hcl Other (See Comments)    Caused "jaws to twist and lock"   Plavix [Clopidogrel Bisulfate] Hives   Sulfonamide Derivatives Other (See Comments)    UNKNOWN REACTION     Current Outpatient Medications  Medication Sig Dispense Refill   acetaminophen (TYLENOL) 325 MG tablet Take 2 tablets (650 mg total) by mouth every 6 (six) hours as needed for mild pain (or Fever >/= 101). 100 tablet 1   albuterol (PROVENTIL) (2.5 MG/3ML) 0.083% nebulizer solution Take 3 mLs (2.5 mg total) by nebulization every 4 (four) hours as needed for wheezing or shortness of breath. 75 mL 2   albuterol (VENTOLIN HFA) 108 (90 Base) MCG/ACT inhaler Inhale 1 puff into the lungs every 4 (four) hours as needed for wheezing or shortness of breath.     amiodarone (PACERONE) 200 MG tablet Take 1 tablet (200 mg total) by mouth daily.     aspirin EC 81 MG tablet Take 1 tablet (81 mg total) by mouth daily with breakfast. Swallow whole. 30 tablet 12   atorvastatin (LIPITOR) 80 MG tablet Take 1 tablet (80 mg total) by mouth daily. 90 tablet 1   bisacodyl (DULCOLAX) 10 MG suppository Place 10 mg rectally once.     clopidogrel (PLAVIX) 75 MG tablet Take 75 mg by mouth daily.     Cyanocobalamin (VITAMIN B-12) 5000 MCG SUBL Take 5,000 mcg by mouth daily.     ertugliflozin L-PyroglutamicAc (STEGLATRO) 15 MG TABS tablet Take 15 mg by mouth daily.     folic acid (FOLVITE) 1 MG tablet Take 1 tablet (1 mg total) by mouth daily.     furosemide (LASIX) 40 MG tablet Take 1 tablet (40 mg total) by mouth daily.     guaifenesin (ROBITUSSIN) 100 MG/5ML  syrup Take 200 mg by mouth every 6 (six) hours as needed for cough.     isosorbide mononitrate (IMDUR) 30 MG 24 hr tablet Take 1 tablet (30 mg total) by mouth daily.     LORazepam (ATIVAN) 0.5 MG tablet Take 1 tablet (0.5 mg total) by mouth every 8 (eight) hours as needed for anxiety. (Patient taking differently: Take 0.5 mg by mouth every 6 (six) hours as needed for anxiety.) 10 tablet 0   melatonin 5 MG TABS Take 5 mg by mouth at bedtime.     metoprolol succinate (TOPROL-XL) 50 MG 24 hr tablet Take 1 tablet (50 mg total) by mouth 2 (two) times daily. Take with or immediately following a meal.     nitroGLYCERIN (NITROSTAT) 0.4 MG SL tablet Place 1 tablet (0.4 mg total) under the tongue every 5 (five) minutes x 3 doses as needed for chest pain. 30 tablet 12   ondansetron (ZOFRAN) 4 MG tablet Take 4 mg by mouth every 6 (six) hours as needed for vomiting or nausea.     OXYGEN Inhale 2 L into the lungs as needed (PRN as needed at night).     pantoprazole (PROTONIX) 20 MG tablet Take 1 tablet (20 mg total) by mouth daily. 90 tablet 3   polyethylene glycol (MIRALAX / GLYCOLAX) 17 g packet Take 17  g by mouth daily.     Potassium Chloride ER 20 MEQ TBCR Take 1 tablet (20 mEq total) by mouth daily. 1 tab daily by mouth 90 tablet 2   ranolazine (RANEXA) 500 MG 12 hr tablet Take 1 tablet (500 mg total) by mouth 2 (two) times daily. 60 tablet 8   rOPINIRole (REQUIP) 0.5 MG tablet Take 0.5 mg by mouth daily.     senna-docusate (SENOKOT-S) 8.6-50 MG tablet Take 2 tablets by mouth at bedtime. 120 tablet 1   ticagrelor (BRILINTA) 60 MG TABS tablet Take 60 mg by mouth 2 (two) times daily.     vitamin E 180 MG (400 UNITS) capsule Take 400 Units by mouth daily.     dicyclomine (BENTYL) 10 MG capsule Take 1 capsule (10 mg total) by mouth every 12 (twelve) hours as needed for spasms (abdominal pain). (Patient not taking: Reported on 06/05/2023) 90 capsule 1   empagliflozin (JARDIANCE) 25 MG TABS tablet Take 25 mg by  mouth daily. (Patient not taking: Reported on 06/05/2023)     HYDROcodone-acetaminophen (NORCO) 7.5-325 MG tablet Take 1 tablet by mouth every 8 (eight) hours as needed for moderate pain. (Patient not taking: Reported on 06/05/2023)     No current facility-administered medications for this visit.     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   Past Surgical History:  Procedure Laterality Date   APPENDECTOMY     BIOPSY  02/24/2022   Procedure: BIOPSY;  Surgeon: Meridee Score Netty Starring., MD;  Location: [facility];  Service: Gastroenterology;;   CARDIAC CATHETERIZATION  2010   stents x2.   CATARACT EXTRACTION     CHOLECYSTECTOMY     COLONOSCOPY  2011   COLONOSCOPY N/A 10/19/2014   Procedure: COLONOSCOPY;  Surgeon: Malissa Hippo, MD;  Location: [facility];  Service: Endoscopy;  Laterality: N/A;  1030   CORONARY STENT INTERVENTION N/A 05/28/2021   Procedure: CORONARY STENT INTERVENTION;  Surgeon: Marykay Lex, MD;  Location: [facility] INVASIVE CV LAB;  Service: Cardiovascular;  Laterality: N/A;   CORONARY ULTRASOUND/IVUS N/A 05/28/2021   Procedure: Intravascular Ultrasound/IVUS;  Surgeon: Marykay Lex, MD;  Location: [facility] INVASIVE CV LAB;  Service: Cardiovascular;  Laterality: N/A;    ESOPHAGEAL DILATION N/A 05/02/2020   Procedure: ESOPHAGEAL DILATION;  Surgeon: Malissa Hippo, MD;  Location: [facility];  Service: Gastroenterology;  Laterality: N/A;   ESOPHAGOGASTRODUODENOSCOPY N/A 10/26/2014   Procedure: ESOPHAGOGASTRODUODENOSCOPY (EGD);  Surgeon: Malissa Hippo, MD;  Location: [facility];  Service: Endoscopy;  Laterality: N/A;  230 - Dr. has lunch and learn   ESOPHAGOGASTRODUODENOSCOPY N/A 02/24/2022   Procedure:  ESOPHAGOGASTRODUODENOSCOPY (EGD);  Surgeon: Lemar Lofty., MD;  Location: [facility];  Service: Gastroenterology;  Laterality: N/A;   ESOPHAGOGASTRODUODENOSCOPY (EGD) WITH PROPOFOL N/A 05/02/2020   Procedure: ESOPHAGOGASTRODUODENOSCOPY (EGD) WITH PROPOFOL;  Surgeon: Malissa Hippo, MD;  Location: [facility];  Service: Gastroenterology;  Laterality: N/A;  250   Esophagogastroduodenoscopy with esophageal dilation  2004, 2006, 2007   EUS N/A 02/24/2022   Procedure: UPPER ENDOSCOPIC ULTRASOUND (EUS) LINEAR;  Surgeon: Lemar Lofty., MD;  Location: [facility];  Service: Gastroenterology;  Laterality: N/A;   FINE NEEDLE ASPIRATION  02/24/2022   Procedure: FINE NEEDLE ASPIRATION;  Surgeon: Meridee Score Netty Starring., MD;  Location: [facility];  Service: Gastroenterology;;  red path sent   GIVENS CAPSULE STUDY N/A 10/31/2014   Procedure: GIVENS CAPSULE STUDY;  Surgeon: Malissa Hippo, MD;  Location: [facility];  Service: Endoscopy;  Laterality: N/A;  730 -- pacemaker--needs monitoring--outpatient bed   INSERT / REPLACE / REMOVE PACEMAKER  2010   LEFT HEART CATH AND CORONARY ANGIOGRAPHY N/A 03/21/2021   Procedure: LEFT HEART CATH AND CORONARY ANGIOGRAPHY;  Surgeon: Lyn Records, MD;  Location: [facility] INVASIVE CV LAB;  Service: Cardiovascular;  Laterality: N/A;   LEFT HEART CATHETERIZATION WITH CORONARY ANGIOGRAM N/A 11/17/2011   Procedure: LEFT HEART CATHETERIZATION WITH CORONARY ANGIOGRAM;  Surgeon: Tonny Bollman, MD;  Location: [facility] CATH LAB;   Service: Cardiovascular;  Laterality: N/A;   LEFT HEART CATHETERIZATION WITH CORONARY ANGIOGRAM N/A 09/26/2014   Procedure: LEFT HEART CATHETERIZATION WITH CORONARY ANGIOGRAM;  Surgeon: Marykay Lex, MD;  Location: [facility] CATH LAB;  Service: Cardiovascular;  Laterality: N/A;   PPM GENERATOR CHANGEOUT N/A 07/16/2022   Procedure: PPM GENERATOR CHANGEOUT;  Surgeon: Marinus Maw, MD;  Location: [facility] INVASIVE CV LAB;  Service: Cardiovascular;  Laterality: N/A;   Right rotator cuff repair     SAVORY DILATION N/A 02/24/2022   Procedure: SAVORY DILATION;  Surgeon: Meridee Score Netty Starring., MD;  Location: [facility];  Service: Gastroenterology;  Laterality: N/A;   SHOULDER ACROMIOPLASTY Right 05/30/2015   Procedure: RIGHT SHOULDER ACROMIOPLASTY;  Surgeon: Vickki Hearing, MD;  Location: [facility];  Service: Orthopedics;  Laterality: Right;   SHOULDER OPEN ROTATOR CUFF REPAIR Right 05/30/2015   Procedure: OPEN ROTATOR CUFF REPAIR RIGHT SHOULDER;  Surgeon: Vickki Hearing, MD;  Location: [facility];  Service: Orthopedics;  Laterality: Right;   SHOULDER OPEN ROTATOR CUFF REPAIR Left 10/22/2016   Procedure: ROTATOR CUFF REPAIR SHOULDER OPEN;  Surgeon: Vickki Hearing, MD;  Location: [facility];  Service: Orthopedics;  Laterality: Left;   Subxiphoid pericardial window  11/10   VAGINAL HYSTERECTOMY       Family History  Problem Relation Age of Onset   Cancer Mother        Colon    Coronary artery disease Sister    Coronary artery disease Brother    Arthritis Other    Lung disease Other    Asthma Other      Social History   Socioeconomic History   Marital status: Widowed    Spouse name: Not on file   Number of children: 1   Years of education: 8   Highest education level: 8th grade  Occupational History   Occupation: Retired    Associate Professor: RETIRED  Tobacco Use   Smoking status: Former    Current packs/day: 0.00    Average packs/day: 2.0 packs/day for 10.0 years (20.0 ttl pk-yrs)    Types:  Cigarettes    Start date: 05/24/1979  Quit date: 05/23/1989    Years since quitting: 34.0    Passive exposure: Past   Smokeless tobacco: Never   Tobacco comments:    Verified by Granddaughter, [name]  Vaping Use   Vaping status: Never Used  Substance and Sexual Activity   Alcohol use: No    Alcohol/week: 0.0 standard drinks of alcohol   Drug use: No   Sexual activity: Not Currently    Partners: Male  Other Topics Concern   Not on file  Social History Narrative   She lives alone, has adopted daughter.   Social Determinants of Health   Financial Resource Strain: Low Risk  (11/05/2022)   Overall Financial Resource Strain (CARDIA)    Difficulty of Paying Living Expenses: Not hard at all  Food Insecurity: No Food Insecurity (03/12/2023)   Hunger Vital Sign    Worried About Running Out of Food in the Last Year: Never true    Ran Out of Food in the Last Year: Never true  Transportation Needs: No Transportation Needs (03/12/2023)   PRAPARE - Administrator, Civil Service (Medical): No    Lack of Transportation (Non-Medical): No  Physical Activity: Inactive (11/05/2022)   Exercise Vital Sign    Days of Exercise per Week: 0 days    Minutes of Exercise per Session: 0 min  Stress: No Stress Concern Present (11/05/2022)   [employer] of Occupational Health - Occupational Stress Questionnaire    Feeling of Stress : Not at all  Social Connections: Moderately Integrated (11/05/2022)   Social Connection and Isolation Panel [NHANES]    Frequency of Communication with Friends and Family: More than three times a week    Frequency of Social Gatherings with Friends and Family: More than three times a week    Attends Religious Services: More than 4 times per year    Active Member of [employer] or Organizations: Yes    Attends Banker Meetings: Never    Marital Status: Widowed  Intimate Partner Violence: Not At Risk (03/12/2023)   Humiliation, Afraid, Rape, and Kick  questionnaire    Fear of Current or Ex-Partner: No    Emotionally Abused: No    Physically Abused: No    Sexually Abused: No     Ht 5' 5.5" (1.664 m)   Wt 123 lb (55.8 kg)   SpO2 96%   BMI 20.16 kg/m   Physical Exam:  elderly appearing with a tremor, NAD HEENT: Unremarkable Neck:  No JVD, no thyromegally Lymphatics:  No adenopathy Back:  No CVA tenderness Lungs:  Clear with no wheezes HEART:  Regular rate rhythm, no murmurs, no rubs, no clicks Abd:  soft, positive bowel sounds, no organomegally, no rebound, no guarding Ext:  2 plus pulses, no edema, no cyanosis, no clubbing Skin:  No rashes no nodules Neuro:  CN II through XII intact, motor grossly intact  EKG - nsr with atrial pacing  DEVICE  Normal device function.  See PaceArt for details.   Assess/Plan:  1.Atrial fib - she is maintaining NSR almost 99% of the time. I have asked her to continue her current dose of amio. 2. PPM -her medtronic DDD PM is working normally. She has undergone gen change out and her device is working normally. 3. Coags - she refuses an OAC. 4. HTN - her bp is fairly well controlled. She is encouraged to avoid salty foods. 5. NSTEMI - she will continue brilinta and I asked her to stop Plavix.  Sharlot Gowda Adaly Puder,MD

## 2023-06-05 NOTE — Patient Instructions (Signed)
Medication Instructions:  Your physician has recommended you make the following change in your medication:   Stop Taking Plavix   *If you need a refill on your cardiac medications before your next appointment, please call your pharmacy*   Lab Work: NONE   If you have labs (blood work) drawn today and your tests are completely normal, you will receive your results only by: MyChart Message (if you have MyChart) OR A paper copy in the mail If you have any lab test that is abnormal or we need to change your treatment, we will call you to review the results.   Testing/Procedures: NONE    Follow-Up: At [facility], you and your health needs are our priority.  As part of our continuing mission to provide you with exceptional heart care, we have created designated Provider Care Teams.  These Care Teams include your primary Cardiologist (physician) and Advanced Practice Providers (APPs -  Physician Assistants and Nurse Practitioners) who all work together to provide you with the care you need, when you need it.  We recommend signing up for the patient portal called "MyChart".  Sign up information is provided on this After Visit Summary.  MyChart is used to connect with patients for Virtual Visits (Telemedicine).  Patients are able to view lab/test results, encounter notes, upcoming appointments, etc.  Non-urgent messages can be sent to your provider as well.   To learn more about what you can do with MyChart, go to [email or web address].    Your next appointment:   1 year(s)  Provider:   Lewayne Bunting, MD    Other Instructions Thank you for choosing [facility]!

## 2023-06-06 DIAGNOSIS — I214 Non-ST elevation (NSTEMI) myocardial infarction: Secondary | ICD-10-CM | POA: Diagnosis not present

## 2023-06-06 DIAGNOSIS — M6281 Muscle weakness (generalized): Secondary | ICD-10-CM | POA: Diagnosis not present

## 2023-06-06 DIAGNOSIS — J449 Chronic obstructive pulmonary disease, unspecified: Secondary | ICD-10-CM | POA: Diagnosis not present

## 2023-06-06 DIAGNOSIS — R279 Unspecified lack of coordination: Secondary | ICD-10-CM | POA: Diagnosis not present

## 2023-06-08 DIAGNOSIS — J449 Chronic obstructive pulmonary disease, unspecified: Secondary | ICD-10-CM | POA: Diagnosis not present

## 2023-06-08 DIAGNOSIS — R279 Unspecified lack of coordination: Secondary | ICD-10-CM | POA: Diagnosis not present

## 2023-06-08 DIAGNOSIS — I214 Non-ST elevation (NSTEMI) myocardial infarction: Secondary | ICD-10-CM | POA: Diagnosis not present

## 2023-06-08 DIAGNOSIS — M6281 Muscle weakness (generalized): Secondary | ICD-10-CM | POA: Diagnosis not present

## 2023-06-09 ENCOUNTER — Ambulatory Visit: Payer: 59 | Attending: Cardiology

## 2023-06-09 DIAGNOSIS — M6281 Muscle weakness (generalized): Secondary | ICD-10-CM | POA: Diagnosis not present

## 2023-06-09 DIAGNOSIS — I214 Non-ST elevation (NSTEMI) myocardial infarction: Secondary | ICD-10-CM | POA: Diagnosis not present

## 2023-06-09 DIAGNOSIS — J449 Chronic obstructive pulmonary disease, unspecified: Secondary | ICD-10-CM | POA: Diagnosis not present

## 2023-06-09 DIAGNOSIS — I480 Paroxysmal atrial fibrillation: Secondary | ICD-10-CM

## 2023-06-09 DIAGNOSIS — R279 Unspecified lack of coordination: Secondary | ICD-10-CM | POA: Diagnosis not present

## 2023-06-09 LAB — ECHOCARDIOGRAM COMPLETE
AR max vel: 2.46 cm2
AV Area VTI: 2.96 cm2
AV Area mean vel: 3.08 cm2
AV Mean grad: 5 mm[Hg]
AV Peak grad: 9.1 mm[Hg]
Ao pk vel: 1.51 m/s
Area-P 1/2: 2.9 cm2
Calc EF: 67.7 %
MV M vel: 5.21 m/s
MV Peak grad: 108.6 mm[Hg]
MV VTI: 2.84 cm2
P 1/2 time: 473 ms
S' Lateral: 3.3 cm
Single Plane A2C EF: 73.2 %
Single Plane A4C EF: 61.6 %

## 2023-06-10 DIAGNOSIS — R279 Unspecified lack of coordination: Secondary | ICD-10-CM | POA: Diagnosis not present

## 2023-06-10 DIAGNOSIS — M6281 Muscle weakness (generalized): Secondary | ICD-10-CM | POA: Diagnosis not present

## 2023-06-10 DIAGNOSIS — I214 Non-ST elevation (NSTEMI) myocardial infarction: Secondary | ICD-10-CM | POA: Diagnosis not present

## 2023-06-10 DIAGNOSIS — J449 Chronic obstructive pulmonary disease, unspecified: Secondary | ICD-10-CM | POA: Diagnosis not present

## 2023-06-11 DIAGNOSIS — M6281 Muscle weakness (generalized): Secondary | ICD-10-CM | POA: Diagnosis not present

## 2023-06-11 DIAGNOSIS — R279 Unspecified lack of coordination: Secondary | ICD-10-CM | POA: Diagnosis not present

## 2023-06-11 DIAGNOSIS — J449 Chronic obstructive pulmonary disease, unspecified: Secondary | ICD-10-CM | POA: Diagnosis not present

## 2023-06-11 DIAGNOSIS — I214 Non-ST elevation (NSTEMI) myocardial infarction: Secondary | ICD-10-CM | POA: Diagnosis not present

## 2023-06-12 DIAGNOSIS — M6281 Muscle weakness (generalized): Secondary | ICD-10-CM | POA: Diagnosis not present

## 2023-06-12 DIAGNOSIS — R279 Unspecified lack of coordination: Secondary | ICD-10-CM | POA: Diagnosis not present

## 2023-06-12 DIAGNOSIS — I214 Non-ST elevation (NSTEMI) myocardial infarction: Secondary | ICD-10-CM | POA: Diagnosis not present

## 2023-06-12 DIAGNOSIS — J449 Chronic obstructive pulmonary disease, unspecified: Secondary | ICD-10-CM | POA: Diagnosis not present

## 2023-06-15 DIAGNOSIS — R279 Unspecified lack of coordination: Secondary | ICD-10-CM | POA: Diagnosis not present

## 2023-06-15 DIAGNOSIS — M6281 Muscle weakness (generalized): Secondary | ICD-10-CM | POA: Diagnosis not present

## 2023-06-15 DIAGNOSIS — I214 Non-ST elevation (NSTEMI) myocardial infarction: Secondary | ICD-10-CM | POA: Diagnosis not present

## 2023-06-15 DIAGNOSIS — J449 Chronic obstructive pulmonary disease, unspecified: Secondary | ICD-10-CM | POA: Diagnosis not present

## 2023-06-16 DIAGNOSIS — J449 Chronic obstructive pulmonary disease, unspecified: Secondary | ICD-10-CM | POA: Diagnosis not present

## 2023-06-16 DIAGNOSIS — R279 Unspecified lack of coordination: Secondary | ICD-10-CM | POA: Diagnosis not present

## 2023-06-16 DIAGNOSIS — I214 Non-ST elevation (NSTEMI) myocardial infarction: Secondary | ICD-10-CM | POA: Diagnosis not present

## 2023-06-16 DIAGNOSIS — M6281 Muscle weakness (generalized): Secondary | ICD-10-CM | POA: Diagnosis not present

## 2023-06-17 DIAGNOSIS — M6281 Muscle weakness (generalized): Secondary | ICD-10-CM | POA: Diagnosis not present

## 2023-06-17 DIAGNOSIS — I214 Non-ST elevation (NSTEMI) myocardial infarction: Secondary | ICD-10-CM | POA: Diagnosis not present

## 2023-06-17 DIAGNOSIS — J449 Chronic obstructive pulmonary disease, unspecified: Secondary | ICD-10-CM | POA: Diagnosis not present

## 2023-06-17 DIAGNOSIS — R279 Unspecified lack of coordination: Secondary | ICD-10-CM | POA: Diagnosis not present

## 2023-06-18 DIAGNOSIS — M6281 Muscle weakness (generalized): Secondary | ICD-10-CM | POA: Diagnosis not present

## 2023-06-18 DIAGNOSIS — J449 Chronic obstructive pulmonary disease, unspecified: Secondary | ICD-10-CM | POA: Diagnosis not present

## 2023-06-18 DIAGNOSIS — I214 Non-ST elevation (NSTEMI) myocardial infarction: Secondary | ICD-10-CM | POA: Diagnosis not present

## 2023-06-18 DIAGNOSIS — R279 Unspecified lack of coordination: Secondary | ICD-10-CM | POA: Diagnosis not present

## 2023-06-19 DIAGNOSIS — R279 Unspecified lack of coordination: Secondary | ICD-10-CM | POA: Diagnosis not present

## 2023-06-19 DIAGNOSIS — I214 Non-ST elevation (NSTEMI) myocardial infarction: Secondary | ICD-10-CM | POA: Diagnosis not present

## 2023-06-19 DIAGNOSIS — M6281 Muscle weakness (generalized): Secondary | ICD-10-CM | POA: Diagnosis not present

## 2023-06-19 DIAGNOSIS — J449 Chronic obstructive pulmonary disease, unspecified: Secondary | ICD-10-CM | POA: Diagnosis not present

## 2023-06-22 DIAGNOSIS — M6281 Muscle weakness (generalized): Secondary | ICD-10-CM | POA: Diagnosis not present

## 2023-06-22 DIAGNOSIS — J449 Chronic obstructive pulmonary disease, unspecified: Secondary | ICD-10-CM | POA: Diagnosis not present

## 2023-06-22 DIAGNOSIS — U071 COVID-19: Secondary | ICD-10-CM | POA: Diagnosis not present

## 2023-06-22 DIAGNOSIS — J069 Acute upper respiratory infection, unspecified: Secondary | ICD-10-CM | POA: Diagnosis not present

## 2023-06-22 DIAGNOSIS — R279 Unspecified lack of coordination: Secondary | ICD-10-CM | POA: Diagnosis not present

## 2023-06-22 DIAGNOSIS — I214 Non-ST elevation (NSTEMI) myocardial infarction: Secondary | ICD-10-CM | POA: Diagnosis not present

## 2023-06-23 DIAGNOSIS — M6281 Muscle weakness (generalized): Secondary | ICD-10-CM | POA: Diagnosis not present

## 2023-06-23 DIAGNOSIS — R279 Unspecified lack of coordination: Secondary | ICD-10-CM | POA: Diagnosis not present

## 2023-06-23 DIAGNOSIS — I214 Non-ST elevation (NSTEMI) myocardial infarction: Secondary | ICD-10-CM | POA: Diagnosis not present

## 2023-06-23 DIAGNOSIS — J449 Chronic obstructive pulmonary disease, unspecified: Secondary | ICD-10-CM | POA: Diagnosis not present

## 2023-06-24 DIAGNOSIS — R279 Unspecified lack of coordination: Secondary | ICD-10-CM | POA: Diagnosis not present

## 2023-06-24 DIAGNOSIS — U071 COVID-19: Secondary | ICD-10-CM | POA: Diagnosis not present

## 2023-06-24 DIAGNOSIS — J449 Chronic obstructive pulmonary disease, unspecified: Secondary | ICD-10-CM | POA: Diagnosis not present

## 2023-06-24 DIAGNOSIS — J069 Acute upper respiratory infection, unspecified: Secondary | ICD-10-CM | POA: Diagnosis not present

## 2023-06-24 DIAGNOSIS — I214 Non-ST elevation (NSTEMI) myocardial infarction: Secondary | ICD-10-CM | POA: Diagnosis not present

## 2023-06-24 DIAGNOSIS — M6281 Muscle weakness (generalized): Secondary | ICD-10-CM | POA: Diagnosis not present

## 2023-06-25 DIAGNOSIS — R279 Unspecified lack of coordination: Secondary | ICD-10-CM | POA: Diagnosis not present

## 2023-06-25 DIAGNOSIS — M6281 Muscle weakness (generalized): Secondary | ICD-10-CM | POA: Diagnosis not present

## 2023-06-25 DIAGNOSIS — J449 Chronic obstructive pulmonary disease, unspecified: Secondary | ICD-10-CM | POA: Diagnosis not present

## 2023-06-25 DIAGNOSIS — I214 Non-ST elevation (NSTEMI) myocardial infarction: Secondary | ICD-10-CM | POA: Diagnosis not present

## 2023-06-26 DIAGNOSIS — J449 Chronic obstructive pulmonary disease, unspecified: Secondary | ICD-10-CM | POA: Diagnosis not present

## 2023-06-26 DIAGNOSIS — R279 Unspecified lack of coordination: Secondary | ICD-10-CM | POA: Diagnosis not present

## 2023-06-26 DIAGNOSIS — U071 COVID-19: Secondary | ICD-10-CM | POA: Diagnosis not present

## 2023-06-26 DIAGNOSIS — M6281 Muscle weakness (generalized): Secondary | ICD-10-CM | POA: Diagnosis not present

## 2023-06-26 DIAGNOSIS — I214 Non-ST elevation (NSTEMI) myocardial infarction: Secondary | ICD-10-CM | POA: Diagnosis not present

## 2023-06-26 DIAGNOSIS — J069 Acute upper respiratory infection, unspecified: Secondary | ICD-10-CM | POA: Diagnosis not present

## 2023-06-29 DIAGNOSIS — R279 Unspecified lack of coordination: Secondary | ICD-10-CM | POA: Diagnosis not present

## 2023-06-29 DIAGNOSIS — I214 Non-ST elevation (NSTEMI) myocardial infarction: Secondary | ICD-10-CM | POA: Diagnosis not present

## 2023-06-29 DIAGNOSIS — M6281 Muscle weakness (generalized): Secondary | ICD-10-CM | POA: Diagnosis not present

## 2023-06-29 DIAGNOSIS — J449 Chronic obstructive pulmonary disease, unspecified: Secondary | ICD-10-CM | POA: Diagnosis not present

## 2023-06-30 DIAGNOSIS — J449 Chronic obstructive pulmonary disease, unspecified: Secondary | ICD-10-CM | POA: Diagnosis not present

## 2023-06-30 DIAGNOSIS — M6281 Muscle weakness (generalized): Secondary | ICD-10-CM | POA: Diagnosis not present

## 2023-06-30 DIAGNOSIS — I214 Non-ST elevation (NSTEMI) myocardial infarction: Secondary | ICD-10-CM | POA: Diagnosis not present

## 2023-06-30 DIAGNOSIS — R279 Unspecified lack of coordination: Secondary | ICD-10-CM | POA: Diagnosis not present

## 2023-07-02 DIAGNOSIS — M6281 Muscle weakness (generalized): Secondary | ICD-10-CM | POA: Diagnosis not present

## 2023-07-02 DIAGNOSIS — R279 Unspecified lack of coordination: Secondary | ICD-10-CM | POA: Diagnosis not present

## 2023-07-02 DIAGNOSIS — I214 Non-ST elevation (NSTEMI) myocardial infarction: Secondary | ICD-10-CM | POA: Diagnosis not present

## 2023-07-02 DIAGNOSIS — J449 Chronic obstructive pulmonary disease, unspecified: Secondary | ICD-10-CM | POA: Diagnosis not present

## 2023-07-03 DIAGNOSIS — I214 Non-ST elevation (NSTEMI) myocardial infarction: Secondary | ICD-10-CM | POA: Diagnosis not present

## 2023-07-03 DIAGNOSIS — M6281 Muscle weakness (generalized): Secondary | ICD-10-CM | POA: Diagnosis not present

## 2023-07-03 DIAGNOSIS — J449 Chronic obstructive pulmonary disease, unspecified: Secondary | ICD-10-CM | POA: Diagnosis not present

## 2023-07-03 DIAGNOSIS — R279 Unspecified lack of coordination: Secondary | ICD-10-CM | POA: Diagnosis not present

## 2023-07-05 ENCOUNTER — Inpatient Hospital Stay (HOSPITAL_COMMUNITY)
Admission: EM | Admit: 2023-07-05 | Discharge: 2023-07-07 | DRG: 291 | Disposition: A | Discharge status: Skilled Nursing Facility | Payer: 59 | Source: Skilled Nursing Facility | Attending: Family Medicine | Admitting: Family Medicine

## 2023-07-05 ENCOUNTER — Emergency Department (HOSPITAL_COMMUNITY): Payer: 59

## 2023-07-05 ENCOUNTER — Other Ambulatory Visit: Payer: Self-pay

## 2023-07-05 ENCOUNTER — Encounter (HOSPITAL_COMMUNITY): Payer: Self-pay | Admitting: Emergency Medicine

## 2023-07-05 DIAGNOSIS — Z8616 Personal history of COVID-19: Secondary | ICD-10-CM

## 2023-07-05 DIAGNOSIS — F419 Anxiety disorder, unspecified: Secondary | ICD-10-CM | POA: Diagnosis present

## 2023-07-05 DIAGNOSIS — I5033 Acute on chronic diastolic (congestive) heart failure: Secondary | ICD-10-CM | POA: Diagnosis present

## 2023-07-05 DIAGNOSIS — I160 Hypertensive urgency: Secondary | ICD-10-CM | POA: Diagnosis present

## 2023-07-05 DIAGNOSIS — J9621 Acute and chronic respiratory failure with hypoxia: Principal | ICD-10-CM | POA: Diagnosis present

## 2023-07-05 DIAGNOSIS — Z8249 Family history of ischemic heart disease and other diseases of the circulatory system: Secondary | ICD-10-CM

## 2023-07-05 DIAGNOSIS — Z9049 Acquired absence of other specified parts of digestive tract: Secondary | ICD-10-CM

## 2023-07-05 DIAGNOSIS — I4819 Other persistent atrial fibrillation: Secondary | ICD-10-CM | POA: Diagnosis present

## 2023-07-05 DIAGNOSIS — I110 Hypertensive heart disease with heart failure: Principal | ICD-10-CM | POA: Diagnosis present

## 2023-07-05 DIAGNOSIS — Z743 Need for continuous supervision: Secondary | ICD-10-CM | POA: Diagnosis not present

## 2023-07-05 DIAGNOSIS — I10 Essential (primary) hypertension: Secondary | ICD-10-CM | POA: Diagnosis present

## 2023-07-05 DIAGNOSIS — K529 Noninfective gastroenteritis and colitis, unspecified: Secondary | ICD-10-CM | POA: Diagnosis present

## 2023-07-05 DIAGNOSIS — Z7982 Long term (current) use of aspirin: Secondary | ICD-10-CM

## 2023-07-05 DIAGNOSIS — Z66 Do not resuscitate: Secondary | ICD-10-CM | POA: Diagnosis present

## 2023-07-05 DIAGNOSIS — U071 COVID-19: Secondary | ICD-10-CM | POA: Diagnosis present

## 2023-07-05 DIAGNOSIS — Z888 Allergy status to other drugs, medicaments and biological substances status: Secondary | ICD-10-CM | POA: Diagnosis not present

## 2023-07-05 DIAGNOSIS — I509 Heart failure, unspecified: Secondary | ICD-10-CM | POA: Diagnosis not present

## 2023-07-05 DIAGNOSIS — Z825 Family history of asthma and other chronic lower respiratory diseases: Secondary | ICD-10-CM

## 2023-07-05 DIAGNOSIS — Z882 Allergy status to sulfonamides status: Secondary | ICD-10-CM

## 2023-07-05 DIAGNOSIS — R0689 Other abnormalities of breathing: Secondary | ICD-10-CM | POA: Diagnosis not present

## 2023-07-05 DIAGNOSIS — I5032 Chronic diastolic (congestive) heart failure: Secondary | ICD-10-CM | POA: Diagnosis present

## 2023-07-05 DIAGNOSIS — Z90710 Acquired absence of both cervix and uterus: Secondary | ICD-10-CM

## 2023-07-05 DIAGNOSIS — Z79899 Other long term (current) drug therapy: Secondary | ICD-10-CM | POA: Diagnosis not present

## 2023-07-05 DIAGNOSIS — Z955 Presence of coronary angioplasty implant and graft: Secondary | ICD-10-CM

## 2023-07-05 DIAGNOSIS — K219 Gastro-esophageal reflux disease without esophagitis: Secondary | ICD-10-CM | POA: Diagnosis present

## 2023-07-05 DIAGNOSIS — Z87891 Personal history of nicotine dependence: Secondary | ICD-10-CM

## 2023-07-05 DIAGNOSIS — E785 Hyperlipidemia, unspecified: Secondary | ICD-10-CM | POA: Diagnosis present

## 2023-07-05 DIAGNOSIS — Z9981 Dependence on supplemental oxygen: Secondary | ICD-10-CM

## 2023-07-05 DIAGNOSIS — R7303 Prediabetes: Secondary | ICD-10-CM

## 2023-07-05 DIAGNOSIS — R6889 Other general symptoms and signs: Secondary | ICD-10-CM | POA: Diagnosis not present

## 2023-07-05 DIAGNOSIS — Z950 Presence of cardiac pacemaker: Secondary | ICD-10-CM

## 2023-07-05 DIAGNOSIS — R0902 Hypoxemia: Secondary | ICD-10-CM | POA: Diagnosis not present

## 2023-07-05 DIAGNOSIS — I2510 Atherosclerotic heart disease of native coronary artery without angina pectoris: Secondary | ICD-10-CM | POA: Diagnosis present

## 2023-07-05 DIAGNOSIS — R140 Abdominal distension (gaseous): Secondary | ICD-10-CM | POA: Diagnosis not present

## 2023-07-05 DIAGNOSIS — Z7902 Long term (current) use of antithrombotics/antiplatelets: Secondary | ICD-10-CM

## 2023-07-05 DIAGNOSIS — I495 Sick sinus syndrome: Secondary | ICD-10-CM | POA: Diagnosis present

## 2023-07-05 DIAGNOSIS — I482 Chronic atrial fibrillation, unspecified: Secondary | ICD-10-CM | POA: Diagnosis present

## 2023-07-05 DIAGNOSIS — J449 Chronic obstructive pulmonary disease, unspecified: Secondary | ICD-10-CM | POA: Diagnosis present

## 2023-07-05 DIAGNOSIS — I517 Cardiomegaly: Secondary | ICD-10-CM | POA: Diagnosis not present

## 2023-07-05 DIAGNOSIS — R0602 Shortness of breath: Secondary | ICD-10-CM | POA: Diagnosis not present

## 2023-07-05 DIAGNOSIS — J9611 Chronic respiratory failure with hypoxia: Secondary | ICD-10-CM | POA: Diagnosis present

## 2023-07-05 LAB — CBC WITH DIFFERENTIAL/PLATELET
Abs Immature Granulocytes: 0.06 10*3/uL (ref 0.00–0.07)
Basophils Absolute: 0.1 10*3/uL (ref 0.0–0.1)
Basophils Relative: 0 %
Eosinophils Absolute: 0 10*3/uL (ref 0.0–0.5)
Eosinophils Relative: 0 %
HCT: 38.3 % (ref 36.0–46.0)
Hemoglobin: 11.3 g/dL — ABNORMAL LOW (ref 12.0–15.0)
Immature Granulocytes: 1 %
Lymphocytes Relative: 13 %
Lymphs Abs: 1.6 10*3/uL (ref 0.7–4.0)
MCH: 29.2 pg (ref 26.0–34.0)
MCHC: 29.5 g/dL — ABNORMAL LOW (ref 30.0–36.0)
MCV: 99 fL (ref 80.0–100.0)
Monocytes Absolute: 1 10*3/uL (ref 0.1–1.0)
Monocytes Relative: 8 %
Neutro Abs: 9.6 10*3/uL — ABNORMAL HIGH (ref 1.7–7.7)
Neutrophils Relative %: 78 %
Platelets: 266 10*3/uL (ref 150–400)
RBC: 3.87 MIL/uL (ref 3.87–5.11)
RDW: 15.7 % — ABNORMAL HIGH (ref 11.5–15.5)
WBC: 12.3 10*3/uL — ABNORMAL HIGH (ref 4.0–10.5)
nRBC: 0 % (ref 0.0–0.2)

## 2023-07-05 LAB — BASIC METABOLIC PANEL
Anion gap: 8 (ref 5–15)
BUN: 12 mg/dL (ref 8–23)
CO2: 26 mmol/L (ref 22–32)
Calcium: 9.2 mg/dL (ref 8.9–10.3)
Chloride: 105 mmol/L (ref 98–111)
Creatinine, Ser: 0.75 mg/dL (ref 0.44–1.00)
GFR, Estimated: >60 mL/min (ref >60)
Glucose, Bld: 123 mg/dL — ABNORMAL HIGH (ref 70–99)
Potassium: 4 mmol/L (ref 3.5–5.1)
Sodium: 139 mmol/L (ref 135–145)

## 2023-07-05 LAB — RESP PANEL BY RT-PCR (RSV, FLU A&B, COVID)  RVPGX2
Influenza A by PCR: NEGATIVE
Influenza B by PCR: NEGATIVE
Resp Syncytial Virus by PCR: NEGATIVE
SARS Coronavirus 2 by RT PCR: POSITIVE — AB

## 2023-07-05 LAB — BLOOD GAS, ARTERIAL
Acid-Base Excess: 1.2 mmol/L (ref 0.0–2.0)
Bicarbonate: 27.1 mmol/L (ref 20.0–28.0)
Drawn by: 10535
O2 Saturation: 99.7 %
Patient temperature: 37
pCO2 arterial: 48 mm[Hg] (ref 32–48)
pH, Arterial: 7.36 (ref 7.35–7.45)
pO2, Arterial: 129 mm[Hg] — ABNORMAL HIGH (ref 83–108)

## 2023-07-05 LAB — TROPONIN I (HIGH SENSITIVITY)
Troponin I (High Sensitivity): 8 ng/L (ref <18)
Troponin I (High Sensitivity): 9 ng/L (ref <18)

## 2023-07-05 LAB — I-STAT CHEM 8, ED
BUN: 11 mg/dL (ref 8–23)
Calcium, Ion: 1.28 mmol/L (ref 1.15–1.40)
Chloride: 103 mmol/L (ref 98–111)
Creatinine, Ser: 0.8 mg/dL (ref 0.44–1.00)
Glucose, Bld: 110 mg/dL — ABNORMAL HIGH (ref 70–99)
HCT: 39 % (ref 36.0–46.0)
Hemoglobin: 13.3 g/dL (ref 12.0–15.0)
Potassium: 4.3 mmol/L (ref 3.5–5.1)
Sodium: 140 mmol/L (ref 135–145)
TCO2: 26 mmol/L (ref 22–32)

## 2023-07-05 LAB — MRSA NEXT GEN BY PCR, NASAL: MRSA by PCR Next Gen: DETECTED — AB

## 2023-07-05 LAB — BRAIN NATRIURETIC PEPTIDE: B Natriuretic Peptide: 629 pg/mL — ABNORMAL HIGH (ref 0.0–100.0)

## 2023-07-05 MED ORDER — IPRATROPIUM-ALBUTEROL 0.5-2.5 (3) MG/3ML IN SOLN
3.0000 mL | Freq: Three times a day (TID) | RESPIRATORY_TRACT | Status: DC
  Administered 2023-07-05 – 2023-07-07 (×5): 3 mL via RESPIRATORY_TRACT
  Filled 2023-07-05 (×5): qty 3

## 2023-07-05 MED ORDER — VITAMIN B-12 1000 MCG PO TABS
5000.0000 ug | ORAL_TABLET | Freq: Every day | ORAL | Status: DC
  Administered 2023-07-06 – 2023-07-07 (×2): 5000 ug via ORAL
  Filled 2023-07-05 (×2): qty 5

## 2023-07-05 MED ORDER — FUROSEMIDE 10 MG/ML IJ SOLN
40.0000 mg | Freq: Once | INTRAMUSCULAR | Status: AC
  Administered 2023-07-05: 40 mg via INTRAVENOUS
  Filled 2023-07-05: qty 4

## 2023-07-05 MED ORDER — SODIUM CHLORIDE 0.9% FLUSH
3.0000 mL | Freq: Two times a day (BID) | INTRAVENOUS | Status: DC
  Administered 2023-07-05 – 2023-07-06 (×4): 3 mL via INTRAVENOUS

## 2023-07-05 MED ORDER — ASPIRIN 81 MG PO TBEC
81.0000 mg | DELAYED_RELEASE_TABLET | Freq: Every day | ORAL | Status: DC
  Administered 2023-07-06 – 2023-07-07 (×2): 81 mg via ORAL
  Filled 2023-07-05 (×2): qty 1

## 2023-07-05 MED ORDER — RANOLAZINE ER 500 MG PO TB12
500.0000 mg | ORAL_TABLET | Freq: Two times a day (BID) | ORAL | Status: DC
  Administered 2023-07-05 – 2023-07-07 (×4): 500 mg via ORAL
  Filled 2023-07-05 (×5): qty 1

## 2023-07-05 MED ORDER — SODIUM CHLORIDE 0.9 % IV SOLN: INTRAVENOUS | Status: AC | PRN

## 2023-07-05 MED ORDER — AMIODARONE HCL 200 MG PO TABS
200.0000 mg | ORAL_TABLET | Freq: Every day | ORAL | Status: DC
  Administered 2023-07-05 – 2023-07-07 (×3): 200 mg via ORAL
  Filled 2023-07-05 (×3): qty 1

## 2023-07-05 MED ORDER — VITAMIN B-12 5000 MCG SL SUBL: 5000.0000 ug | SUBLINGUAL_TABLET | Freq: Every day | SUBLINGUAL | Status: DC

## 2023-07-05 MED ORDER — ISOSORBIDE MONONITRATE ER 60 MG PO TB24
30.0000 mg | ORAL_TABLET | Freq: Every day | ORAL | Status: DC
  Administered 2023-07-05 – 2023-07-07 (×3): 30 mg via ORAL
  Filled 2023-07-05 (×3): qty 1

## 2023-07-05 MED ORDER — SODIUM CHLORIDE 0.9% FLUSH: 3.0000 mL | INTRAVENOUS | Status: DC | PRN

## 2023-07-05 MED ORDER — MUPIROCIN 2 % EX OINT
TOPICAL_OINTMENT | Freq: Two times a day (BID) | CUTANEOUS | Status: DC
  Administered 2023-07-05 – 2023-07-06 (×2): 1 via NASAL
  Filled 2023-07-05 (×3): qty 22

## 2023-07-05 MED ORDER — ALBUTEROL SULFATE HFA 108 (90 BASE) MCG/ACT IN AERS
2.0000 | INHALATION_SPRAY | RESPIRATORY_TRACT | Status: DC | PRN
Start: 2023-07-05 — End: 2023-07-05

## 2023-07-05 MED ORDER — FOLIC ACID 1 MG PO TABS
1.0000 mg | ORAL_TABLET | Freq: Every day | ORAL | Status: DC
  Administered 2023-07-05 – 2023-07-07 (×3): 1 mg via ORAL
  Filled 2023-07-05 (×3): qty 1

## 2023-07-05 MED ORDER — METHYLPREDNISOLONE SODIUM SUCC 125 MG IJ SOLR
125.0000 mg | Freq: Once | INTRAMUSCULAR | Status: AC
  Administered 2023-07-05: 125 mg via INTRAVENOUS
  Filled 2023-07-05: qty 2

## 2023-07-05 MED ORDER — TICAGRELOR 60 MG PO TABS
60.0000 mg | ORAL_TABLET | Freq: Two times a day (BID) | ORAL | Status: DC
  Administered 2023-07-05 – 2023-07-07 (×4): 60 mg via ORAL
  Filled 2023-07-05 (×6): qty 1

## 2023-07-05 MED ORDER — POLYETHYLENE GLYCOL 3350 17 G PO PACK
17.0000 g | PACK | Freq: Every day | ORAL | Status: DC
  Administered 2023-07-06 – 2023-07-07 (×2): 17 g via ORAL
  Filled 2023-07-05 (×3): qty 1

## 2023-07-05 MED ORDER — NIRMATRELVIR&RITONAVIR 150/100 10 X 150 MG & 10 X 100MG PO TBPK
2.0000 | ORAL_TABLET | Freq: Two times a day (BID) | ORAL | Status: DC
  Filled 2023-07-05: qty 2

## 2023-07-05 MED ORDER — BISACODYL 10 MG RE SUPP: 10.0000 mg | Freq: Every day | RECTAL | Status: DC | PRN

## 2023-07-05 MED ORDER — GUAIFENESIN ER 600 MG PO TB12
600.0000 mg | ORAL_TABLET | Freq: Two times a day (BID) | ORAL | Status: DC
  Administered 2023-07-05 – 2023-07-07 (×5): 600 mg via ORAL
  Filled 2023-07-05 (×5): qty 1

## 2023-07-05 MED ORDER — ATORVASTATIN CALCIUM 40 MG PO TABS
80.0000 mg | ORAL_TABLET | Freq: Every day | ORAL | Status: DC
  Administered 2023-07-05 – 2023-07-07 (×3): 80 mg via ORAL
  Filled 2023-07-05 (×3): qty 2

## 2023-07-05 MED ORDER — SODIUM CHLORIDE 0.9% FLUSH
3.0000 mL | Freq: Two times a day (BID) | INTRAVENOUS | Status: DC
  Administered 2023-07-05 – 2023-07-07 (×5): 3 mL via INTRAVENOUS

## 2023-07-05 MED ORDER — IPRATROPIUM-ALBUTEROL 0.5-2.5 (3) MG/3ML IN SOLN
3.0000 mL | Freq: Four times a day (QID) | RESPIRATORY_TRACT | Status: DC
  Administered 2023-07-05 (×3): 3 mL via RESPIRATORY_TRACT
  Filled 2023-07-05 (×3): qty 3

## 2023-07-05 MED ORDER — NITROGLYCERIN IN D5W 200-5 MCG/ML-% IV SOLN
5.0000 ug/min | INTRAVENOUS | Status: DC
  Administered 2023-07-05: 20 ug/min via INTRAVENOUS
  Filled 2023-07-05: qty 250

## 2023-07-05 MED ORDER — FUROSEMIDE 10 MG/ML IJ SOLN
40.0000 mg | Freq: Every day | INTRAMUSCULAR | Status: DC
  Administered 2023-07-06 – 2023-07-07 (×2): 40 mg via INTRAVENOUS
  Filled 2023-07-05 (×2): qty 4

## 2023-07-05 MED ORDER — POLYETHYLENE GLYCOL 3350 17 G PO PACK
17.0000 g | PACK | Freq: Every day | ORAL | Status: DC | PRN
  Administered 2023-07-05: 17 g via ORAL

## 2023-07-05 MED ORDER — IPRATROPIUM-ALBUTEROL 0.5-2.5 (3) MG/3ML IN SOLN
3.0000 mL | Freq: Once | RESPIRATORY_TRACT | Status: AC
  Administered 2023-07-05: 3 mL via RESPIRATORY_TRACT
  Filled 2023-07-05: qty 3

## 2023-07-05 MED ORDER — PANTOPRAZOLE SODIUM 20 MG PO TBEC
20.0000 mg | DELAYED_RELEASE_TABLET | Freq: Every day | ORAL | Status: DC
  Filled 2023-07-05 (×4): qty 1

## 2023-07-05 MED ORDER — MOLNUPIRAVIR 200 MG PO CAPS: 4.0000 | ORAL_CAPSULE | Freq: Two times a day (BID) | ORAL | Status: DC

## 2023-07-05 MED ORDER — IPRATROPIUM-ALBUTEROL 20-100 MCG/ACT IN AERS: 1.0000 | INHALATION_SPRAY | Freq: Four times a day (QID) | RESPIRATORY_TRACT | Status: DC

## 2023-07-05 MED ORDER — ONDANSETRON HCL 4 MG PO TABS
4.0000 mg | ORAL_TABLET | Freq: Four times a day (QID) | ORAL | Status: DC | PRN
  Administered 2023-07-06: 4 mg via ORAL
  Filled 2023-07-05: qty 1

## 2023-07-05 MED ORDER — ALBUTEROL SULFATE (2.5 MG/3ML) 0.083% IN NEBU: 2.5000 mg | INHALATION_SOLUTION | RESPIRATORY_TRACT | Status: DC | PRN

## 2023-07-05 MED ORDER — ACETAMINOPHEN 325 MG PO TABS
650.0000 mg | ORAL_TABLET | Freq: Four times a day (QID) | ORAL | Status: DC | PRN
  Administered 2023-07-06: 650 mg via ORAL

## 2023-07-05 MED ORDER — ACETAMINOPHEN 650 MG RE SUPP: 650.0000 mg | Freq: Four times a day (QID) | RECTAL | Status: DC | PRN

## 2023-07-05 MED ORDER — ONDANSETRON HCL 4 MG/2ML IJ SOLN: 4.0000 mg | Freq: Four times a day (QID) | INTRAMUSCULAR | Status: DC | PRN

## 2023-07-05 MED ORDER — HEPARIN SODIUM (PORCINE) 5000 UNIT/ML IJ SOLN
5000.0000 [IU] | Freq: Three times a day (TID) | INTRAMUSCULAR | Status: DC
  Administered 2023-07-05 – 2023-07-07 (×5): 5000 [IU] via SUBCUTANEOUS
  Filled 2023-07-05 (×5): qty 1

## 2023-07-05 MED ORDER — METHYLPREDNISOLONE SODIUM SUCC 40 MG IJ SOLR
40.0000 mg | Freq: Two times a day (BID) | INTRAMUSCULAR | Status: DC
  Administered 2023-07-05 – 2023-07-07 (×4): 40 mg via INTRAVENOUS
  Filled 2023-07-05 (×4): qty 1

## 2023-07-05 MED ORDER — METOPROLOL SUCCINATE ER 50 MG PO TB24
50.0000 mg | ORAL_TABLET | Freq: Two times a day (BID) | ORAL | Status: DC
  Administered 2023-07-05 – 2023-07-07 (×4): 50 mg via ORAL
  Filled 2023-07-05 (×4): qty 1

## 2023-07-05 MED ORDER — CHLORHEXIDINE GLUCONATE CLOTH 2 % EX PADS
6.0000 | MEDICATED_PAD | Freq: Every day | CUTANEOUS | Status: DC
  Administered 2023-07-06: 6 via TOPICAL

## 2023-07-05 MED ORDER — SENNOSIDES-DOCUSATE SODIUM 8.6-50 MG PO TABS
2.0000 | ORAL_TABLET | Freq: Every day | ORAL | Status: DC
Start: 2023-07-05 — End: 2023-07-07
  Administered 2023-07-05 – 2023-07-06 (×2): 2 via ORAL
  Filled 2023-07-05 (×2): qty 2

## 2023-07-05 MED ORDER — PAROXETINE HCL 10 MG PO TABS
10.0000 mg | ORAL_TABLET | Freq: Every day | ORAL | Status: DC
  Administered 2023-07-05 – 2023-07-07 (×3): 10 mg via ORAL
  Filled 2023-07-05 (×6): qty 1

## 2023-07-05 MED ORDER — ALBUTEROL SULFATE (2.5 MG/3ML) 0.083% IN NEBU
INHALATION_SOLUTION | RESPIRATORY_TRACT | Status: AC
  Filled 2023-07-05: qty 3

## 2023-07-05 NOTE — ED Provider Notes (Signed)
[place] EMERGENCY DEPARTMENT AT [facility]  Provider Note  CSN: [id number] Arrival date & time: 07/05/23 0459  History Chief Complaint  Patient presents with   Respiratory Distress    [name] is a 87 y.o. female with COPD and extensive cardiac history brought to the ED via EMS from local SNF for evaluation of SOB. She has had incresing SOB during the last several hours, recently treated for Covid. She was given a neb treatment without much improvement. EMS reports SpO2 was in the low 80s on her usual 4L nasal canula. They gave her an additional neb and started her on NRB. She reports some chest discomfort. No reported fever.    Home Medications Prior to Admission medications   Medication Sig Start Date End Date Taking? Authorizing Provider  acetaminophen (TYLENOL) 325 MG tablet Take 2 tablets (650 mg total) by mouth every 6 (six) hours as needed for mild pain (or Fever >/= 101). 11/25/22   Emokpae, Courage, MD  albuterol (PROVENTIL) (2.5 MG/3ML) 0.083% nebulizer solution Take 3 mLs (2.5 mg total) by nebulization every 4 (four) hours as needed for wheezing or shortness of breath. 07/02/22 07/02/23  Shon Hale, MD  albuterol (VENTOLIN HFA) 108 (90 Base) MCG/ACT inhaler Inhale 1 puff into the lungs every 4 (four) hours as needed for wheezing or shortness of breath.    [provider]  amiodarone (PACERONE) 200 MG tablet Take 1 tablet (200 mg total) by mouth daily. 03/16/23   Miguel Rota, MD  aspirin EC 81 MG tablet Take 1 tablet (81 mg total) by mouth daily with breakfast. Swallow whole. 07/02/22   Shon Hale, MD  atorvastatin (LIPITOR) 80 MG tablet Take 1 tablet (80 mg total) by mouth daily. 02/06/23   Jonelle Sidle, MD  bisacodyl (DULCOLAX) 10 MG suppository Place 10 mg rectally once.    [provider]  Cyanocobalamin (VITAMIN B-12) 5000 MCG SUBL Take 5,000 mcg by mouth daily.    [provider]  dicyclomine (BENTYL) 10 MG  capsule Take 1 capsule (10 mg total) by mouth every 12 (twelve) hours as needed for spasms (abdominal pain). Patient not taking: Reported on 06/05/2023 12/08/22   Marguerita Merles, Reuel Boom, MD  empagliflozin (JARDIANCE) 25 MG TABS tablet Take 25 mg by mouth daily. Patient not taking: Reported on 06/05/2023    [provider]  ertugliflozin L-PyroglutamicAc (STEGLATRO) 15 MG TABS tablet Take 15 mg by mouth daily.    [provider]  folic acid (FOLVITE) 1 MG tablet Take 1 tablet (1 mg total) by mouth daily. 11/12/22   Catarina Hartshorn, MD  furosemide (LASIX) 40 MG tablet Take 1 tablet (40 mg total) by mouth daily. 03/16/23   Amin, Ankit C, MD  guaifenesin (ROBITUSSIN) 100 MG/5ML syrup Take 200 mg by mouth every 6 (six) hours as needed for cough.    [provider]  HYDROcodone-acetaminophen (NORCO) 7.5-325 MG tablet Take 1 tablet by mouth every 8 (eight) hours as needed for moderate pain. Patient not taking: Reported on 06/05/2023 12/26/22   [provider]  isosorbide mononitrate (IMDUR) 30 MG 24 hr tablet Take 1 tablet (30 mg total) by mouth daily. 03/16/23   Amin, Ankit C, MD  LORazepam (ATIVAN) 0.5 MG tablet Take 1 tablet (0.5 mg total) by mouth every 8 (eight) hours as needed for anxiety. Patient taking differently: Take 0.5 mg by mouth every 6 (six) hours as needed for anxiety. 03/15/23   Miguel Rota, MD  melatonin  5 MG TABS Take 5 mg by mouth at bedtime.    [provider]  metoprolol succinate (TOPROL-XL) 50 MG 24 hr tablet Take 1 tablet (50 mg total) by mouth 2 (two) times daily. Take with or immediately following a meal. 03/15/23   Amin, Ankit C, MD  nitroGLYCERIN (NITROSTAT) 0.4 MG SL tablet Place 1 tablet (0.4 mg total) under the tongue every 5 (five) minutes x 3 doses as needed for chest pain. 02/06/23   Jonelle Sidle, MD  ondansetron (ZOFRAN) 4 MG tablet Take 4 mg by mouth every 6 (six) hours as needed for vomiting or nausea.    [provider]   OXYGEN Inhale 2 L into the lungs as needed (PRN as needed at night).    [provider]  pantoprazole (PROTONIX) 20 MG tablet Take 1 tablet (20 mg total) by mouth daily. 04/20/23   Dolores Frame, MD  polyethylene glycol (MIRALAX / GLYCOLAX) 17 g packet Take 17 g by mouth daily.    [provider]  Potassium Chloride ER 20 MEQ TBCR Take 1 tablet (20 mEq total) by mouth daily. 1 tab daily by mouth 02/06/23 11/03/23  Jonelle Sidle, MD  ranolazine (RANEXA) 500 MG 12 hr tablet Take 1 tablet (500 mg total) by mouth 2 (two) times daily. 07/02/22   Shon Hale, MD  rOPINIRole (REQUIP) 0.5 MG tablet Take 0.5 mg by mouth daily. 01/20/23   [provider]  senna-docusate (SENOKOT-S) 8.6-50 MG tablet Take 2 tablets by mouth at bedtime. 11/25/22 11/25/23  Shon Hale, MD  ticagrelor (BRILINTA) 60 MG TABS tablet Take 60 mg by mouth 2 (two) times daily.    [provider]  vitamin E 180 MG (400 UNITS) capsule Take 400 Units by mouth daily.    [provider]     Allergies    Amitriptyline hcl, Plavix [clopidogrel bisulfate], and Sulfonamide derivatives   Review of Systems   Review of Systems Please see HPI for pertinent positives and negatives  Physical Exam BP (!) 148/66   Pulse 60   Temp 99.2 F (37.3 C) (Rectal)   Resp (!) 21   Ht 5\' 5"  (1.651 m)   Wt 55.8 kg   SpO2 97%   BMI 20.47 kg/m   Physical Exam Vitals and nursing note reviewed.  Constitutional:      Appearance: Normal appearance.  HENT:     Head: Normocephalic and atraumatic.     Nose: Nose normal.     Mouth/Throat:     Mouth: Mucous membranes are moist.  Eyes:     Extraocular Movements: Extraocular movements intact.     Conjunctiva/sclera: Conjunctivae normal.  Cardiovascular:     Rate and Rhythm: Normal rate.  Pulmonary:     Breath sounds: No wheezing or rales.     Comments: Decreased air movement Abdominal:     General: Abdomen is flat.     Palpations:  Abdomen is soft.     Tenderness: There is no abdominal tenderness.  Musculoskeletal:        General: No swelling. Normal range of motion.     Cervical back: Neck supple.     Right lower leg: No edema.     Left lower leg: No edema.  Skin:    General: Skin is warm and dry.  Neurological:     General: No focal deficit present.     Mental Status: She is alert.  Psychiatric:        Mood and  Affect: Mood normal.     ED Results / Procedures / Treatments   EKG EKG Interpretation Date/Time:  Sunday July 05 2023 05:06:43 EST Ventricular Rate:  64 PR Interval:  206 QRS Duration:  99 QT Interval:  487 QTC Calculation: 503 R Axis:   68  Text Interpretation: ATRIAL PACED RHYTHM No significant change since last tracing Confirmed by Susy Frizzle [phone]) on 07/05/2023 5:13:32 AM  Procedures .Critical Care  Performed by: Pollyann Savoy, MD Authorized by: Pollyann Savoy, MD   Critical care provider statement:    Critical care time (minutes):  45   Critical care time was exclusive of:  Separately billable procedures and treating other patients   Critical care was necessary to treat or prevent imminent or life-threatening deterioration of the following conditions:  Respiratory failure   Critical care was time spent personally by me on the following activities:  Development of treatment plan with patient or surrogate, discussions with consultants, evaluation of patient's response to treatment, examination of patient, ordering and review of laboratory studies, ordering and review of radiographic studies, ordering and performing treatments and interventions, pulse oximetry, re-evaluation of patient's condition and review of old charts   Care discussed with: admitting provider     Medications Ordered in the ED Medications  nitroGLYCERIN 50 mg in dextrose 5 % 250 mL (0.2 mg/mL) infusion (20 mcg/min Intravenous New Bag/Given 07/05/23 0634)  furosemide (LASIX) injection 40 mg (has no  administration in time range)  ipratropium-albuterol (DUONEB) 0.5-2.5 (3) MG/3ML nebulizer solution 3 mL (3 mLs Nebulization Given 07/05/23 0521)  methylPREDNISolone sodium succinate (SOLU-MEDROL) 125 mg/2 mL injection 125 mg (125 mg Intravenous Given 07/05/23 0520)  albuterol (PROVENTIL) (2.5 MG/3ML) 0.083% nebulizer solution (  Given 07/05/23 0521)    Initial Impression and Plan  Patient with complex past medical history here for SOB, recent Covid treatment per EMS. Consider COPD, PNA, CHF. Will check labs and begin BiPAP for work of breathing. She has a MOST form indicating DNR/DNI, limited treatment including IVF, Abx, bipap etc if needed.   ED Course   Clinical Course as of 07/05/23 0705  Sun Jul 05, 2023  0528 WOB improved on BiPAP.  [CS]  0603 CBC with mild leukocytosis. ABG is unremarkable.   I personally viewed the images from radiology studies and agree with radiologist interpretation: CXR concerning for CHF. Will give a dose of Lasix when BMP is resulted. BP remains elevated, will start NTG drip as well.  [CS]  [id number] Per lab, chemistry results will be delayed due to QA on machine. Will add an I-stat chem to get a result sooner. [CS]  0654 I-stat is unremarkable. Will give a dose of Lasix.  [CS]  [id number] Assumed care from Dr Bernette Mayers. 87 yo F with hx of CAD and COPD on 4L [place] who presented with SOB satting in mid 80s at her facility. Likely is combined COPD/CHF exacerbation. Has nitro gtt running and is on BiPAP. Is DNR/DNI awaiting BNP and troponin.  [RP]  0702 Care of the patient signed out at the change of shift pending lab results.  [CS]    Clinical Course User Index [CS] Pollyann Savoy, MD [RP] Rondel Baton, MD     MDM Rules/Calculators/A&P Medical Decision Making Problems Addressed: Acute and chronic respiratory failure with hypoxia [facility]): chronic illness or injury with exacerbation, progression, or side effects of treatment Acute on chronic congestive heart failure,  unspecified heart failure type [facility]): chronic illness or injury with exacerbation, progression,  or side effects of treatment  Amount and/or Complexity of Data Reviewed Labs: ordered. Decision-making details documented in ED Course. Radiology: ordered and independent interpretation performed. Decision-making details documented in ED Course. ECG/medicine tests: ordered and independent interpretation performed. Decision-making details documented in ED Course.  Risk Prescription drug management. Decision regarding hospitalization.     Final Clinical Impression(s) / ED Diagnoses Final diagnoses:  Acute and chronic respiratory failure with hypoxia (HCC)  Acute on chronic congestive heart failure, unspecified heart failure type [facility])    Rx / DC Orders ED Discharge Orders     None        Pollyann Savoy, MD 07/05/23 [phone]

## 2023-07-05 NOTE — ED Triage Notes (Addendum)
Pt bib EMS for respiratory distress from [facility]. Pt with recent dx of Covid. EMS reports pt's O2 sats on 4L [place] was 82% after NH had administered a nebulizer treatment. Dr Bernette Mayers at bedside.

## 2023-07-05 NOTE — ED Provider Notes (Signed)
  Physical Exam  BP (!) 148/66   Pulse 60   Temp 99.2 F (37.3 C) (Rectal)   Resp (!) 21   Ht 5\' 5"  (1.651 m)   Wt 55.8 kg   SpO2 97%   BMI 20.47 kg/m   Physical Exam  Procedures  Procedures  ED Course / MDM   Clinical Course as of 07/05/23 0809  Sun Jul 05, 2023  0528 WOB improved on BiPAP.  [CS]  0603 CBC with mild leukocytosis. ABG is unremarkable.   I personally viewed the images from radiology studies and agree with radiologist interpretation: CXR concerning for CHF. Will give a dose of Lasix when BMP is resulted. BP remains elevated, will start NTG drip as well.  [CS]  [id number] Per lab, chemistry results will be delayed due to QA on machine. Will add an I-stat chem to get a result sooner. [CS]  0654 I-stat is unremarkable. Will give a dose of Lasix.  [CS]  [id number] Assumed care from Dr Bernette Mayers. 87 yo F with hx of CAD and COPD on 4L [place] who presented with SOB satting in mid 80s at her facility. Likely is combined COPD/CHF exacerbation. Has nitro gtt running and is on BiPAP. Is DNR/DNI awaiting BNP and troponin.  [RP]  0702 Care of the patient signed out at the change of shift pending lab results.  [CS]  [id number] Patient reassessed and is stable on BiPAP.  Troponin WNL.  BNP still pending.  Chemistry has returned and is unremarkable.  Dr Mariea Clonts from hospitalist has called and will admit the patient. [RP]    Clinical Course User Index [CS] Pollyann Savoy, MD [RP] Rondel Baton, MD   Medical Decision Making Amount and/or Complexity of Data Reviewed Labs: ordered. Radiology: ordered.  Risk Prescription drug management. Decision regarding hospitalization.      Rondel Baton, MD 07/05/23 [phone]

## 2023-07-05 NOTE — Progress Notes (Signed)
Lab called with positive MRSA Nasal swab results.  Standing order for mupirocin initiated per protocol.

## 2023-07-05 NOTE — H&P (Signed)
Patient Demographics:    [name], is a 87 y.o. female  MRN: [id number]   DOB - 11-Oct-1935  Admit Date - 07/05/2023  Outpatient Primary MD for the patient is [name], MD   Assessment & Plan:   Assessment and Plan: 1) acute on chronic diastolic dysfunction CHF exacerbation--POA -Echo from 06/09/2023 with EF of 60 to 65%, grade 2 diastolic dysfunction mild to moderate MR no MS, no aortic stenosis --Chest x-ray suggest CHF ,BNP 629 up from 148 3 months ago -Initial troponin 8 repeat troponin 9, EKG is atrial paced rhythm -IV Lasix, daily weights, fluid input and output monitoring and Reds vest as requested  2) chronic atrial fibrillation/tachybradycardia syndrome--status post pacemaker placement ---EKG is atrial paced rhythm --Continue amiodarone and metoprolol for rate control Patient-Is not on Eliquis due to need to be on DAPT  3)CAD with prior angioplasty and stenting--continue aspirin and Brilinta\-continue metoprolol, Ranexa and atorvastatin  4)Covid 19--COVID test is still positive----Patient was apparently treated with molnupiravir starting on 06/22/2023 for positive COVID test, many residents at facility with COVID -Flu and RSV negative -Solu-Medrol, bronchodilators and colitis -Supportive care  5) acute on chronic hypoxic respiratory failure----mostly due to #1 above -ABG noted without hypercapnia -Patient now requires 4 L of oxygen currently using high flow nasal cannula alternating with BiPAP  6) hypertensive urgency--- successfully weaned off nitro drip -Continue isosorbide, metoprolol Lasix  7)Social/Ethics--patient has a MOST form from facility, reviewed with patient, patient remains a DNR  8)GERD--- continue Protonix  Status is: Inpatient  Remains inpatient appropriate because:    Dispo: The patient is from: SNF              Anticipated d/c is to: SNF              Anticipated d/c date is: 2 days              Patient currently is not medically stable to d/c. 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    Essential hypertension    GERD (gastroesophageal reflux disease)    H/O hiatal hernia    Headache(784.0)    HOH (hard of hearing)    Hyperlipidemia    Neuromuscular disorder (HCC)    Tremors   Pericardial effusion    Hemorrhagic    Presence of permanent cardiac pacemaker    Tachycardia-bradycardia syndrome (HCC)    s/p Medtronic Adapta L dual chamber device  5/10      Past Surgical History:  Procedure Laterality Date   APPENDECTOMY  BIOPSY  02/24/2022   Procedure: BIOPSY;  Surgeon: Meridee Score Netty Starring., MD;  Location: [facility];  Service: Gastroenterology;;   CARDIAC CATHETERIZATION  2010   stents x2.   CATARACT EXTRACTION     CHOLECYSTECTOMY     COLONOSCOPY  2011   COLONOSCOPY N/A 10/19/2014   Procedure: COLONOSCOPY;  Surgeon: Malissa Hippo, MD;  Location: [facility];  Service: Endoscopy;  Laterality: N/A;  1030   CORONARY STENT INTERVENTION N/A 05/28/2021   Procedure: CORONARY STENT INTERVENTION;  Surgeon: Marykay Lex, MD;  Location: [facility] INVASIVE CV LAB;  Service: Cardiovascular;  Laterality: N/A;   CORONARY ULTRASOUND/IVUS N/A 05/28/2021   Procedure: Intravascular Ultrasound/IVUS;  Surgeon: Marykay Lex, MD;  Location: [facility] INVASIVE CV LAB;  Service: Cardiovascular;  Laterality: N/A;   ESOPHAGEAL DILATION N/A 05/02/2020   Procedure: ESOPHAGEAL DILATION;  Surgeon: Malissa Hippo, MD;  Location: [facility];  Service: Gastroenterology;  Laterality: N/A;   ESOPHAGOGASTRODUODENOSCOPY N/A 10/26/2014   Procedure: ESOPHAGOGASTRODUODENOSCOPY (EGD);  Surgeon: Malissa Hippo, MD;  Location: [facility];  Service: Endoscopy;  Laterality: N/A;  230 - Dr. has lunch and learn   ESOPHAGOGASTRODUODENOSCOPY N/A 02/24/2022   Procedure: ESOPHAGOGASTRODUODENOSCOPY (EGD);  Surgeon: Lemar Lofty., MD;  Location: [facility];  Service: Gastroenterology;  Laterality: N/A;   ESOPHAGOGASTRODUODENOSCOPY (EGD) WITH PROPOFOL N/A 05/02/2020   Procedure: ESOPHAGOGASTRODUODENOSCOPY (EGD) WITH PROPOFOL;  Surgeon: Malissa Hippo, MD;  Location: [facility];  Service: Gastroenterology;  Laterality: N/A;  250   Esophagogastroduodenoscopy with esophageal dilation  2004, 2006, 2007   EUS N/A 02/24/2022   Procedure: UPPER ENDOSCOPIC ULTRASOUND (EUS) LINEAR;  Surgeon: Lemar Lofty., MD;  Location: [facility];  Service: Gastroenterology;  Laterality: N/A;   FINE NEEDLE ASPIRATION  02/24/2022   Procedure: FINE NEEDLE ASPIRATION;  Surgeon: Meridee Score Netty Starring., MD;  Location: [facility];  Service: Gastroenterology;;  red path sent   GIVENS CAPSULE STUDY N/A 10/31/2014   Procedure: GIVENS CAPSULE STUDY;  Surgeon: Malissa Hippo, MD;  Location: [facility];  Service: Endoscopy;  Laterality: N/A;  730 -- pacemaker--needs monitoring--outpatient bed   INSERT / REPLACE / REMOVE PACEMAKER  2010   LEFT HEART CATH AND CORONARY ANGIOGRAPHY N/A 03/21/2021   Procedure: LEFT HEART CATH AND CORONARY ANGIOGRAPHY;  Surgeon: Lyn Records, MD;  Location: [facility] INVASIVE CV LAB;  Service: Cardiovascular;  Laterality: N/A;   LEFT HEART CATHETERIZATION WITH CORONARY ANGIOGRAM N/A 11/17/2011   Procedure: LEFT HEART CATHETERIZATION WITH CORONARY ANGIOGRAM;  Surgeon: Tonny Bollman, MD;  Location: [facility] CATH LAB;  Service: Cardiovascular;  Laterality: N/A;   LEFT HEART CATHETERIZATION WITH CORONARY ANGIOGRAM N/A 09/26/2014    Procedure: LEFT HEART CATHETERIZATION WITH CORONARY ANGIOGRAM;  Surgeon: Marykay Lex, MD;  Location: [facility] CATH LAB;  Service: Cardiovascular;  Laterality: N/A;   PPM GENERATOR CHANGEOUT N/A 07/16/2022   Procedure: PPM GENERATOR CHANGEOUT;  Surgeon: Marinus Maw, MD;  Location: [facility] INVASIVE CV LAB;  Service: Cardiovascular;  Laterality: N/A;   Right rotator cuff repair     SAVORY DILATION N/A 02/24/2022   Procedure: SAVORY DILATION;  Surgeon: Meridee Score Netty Starring., MD;  Location: [facility];  Service: Gastroenterology;  Laterality: N/A;   SHOULDER ACROMIOPLASTY Right 05/30/2015   Procedure: RIGHT SHOULDER ACROMIOPLASTY;  Surgeon: Vickki Hearing, MD;  Location: [facility];  Service: Orthopedics;  Laterality: Right;   SHOULDER OPEN ROTATOR CUFF REPAIR Right 05/30/2015   Procedure: OPEN ROTATOR CUFF REPAIR RIGHT SHOULDER;  Surgeon: Vickki Hearing, MD;  Location: [facility];  Service: Orthopedics;  Laterality: Right;   SHOULDER OPEN ROTATOR CUFF REPAIR Left 10/22/2016   Procedure: ROTATOR CUFF REPAIR SHOULDER OPEN;  Surgeon: Vickki Hearing, MD;  Location: [facility];  Service: Orthopedics;  Laterality: Left;   Subxiphoid pericardial window  11/10   VAGINAL HYSTERECTOMY      Chief Complaint  Patient presents with   Respiratory Distress      HPI:    Debbi Tukes  is a 87 y.o. female with past medical history relevant for diastolic dysfunction CHF, chronic atrial fibrillation, CAD with prior angioplasty and stenting, COPD, GERD and HLD, HTN, and pacemaker in situ for tachybradycardia syndrome, history of chronic hypoxic respiratory failure usually uses 4 L of oxygen at baseline presents by EMS from [facility] facility with worsening shortness of breath -- In the ED she was very hypoxic and required BiPAP initially -She was also found to have severely elevated blood pressures and started on nitro drip -Chest x-ray suggest CHF ,BNP 629 up from 148 3 months ago -Initial troponin 8 repeat  troponin 9, EKG is atrial paced rhythm WBC is 12.3 but apparently patient was recently treated with steroids, hemoglobin 11.3 which is close to prior baseline platelets 266 -ABG noted without hypercapnia -Creatinine 0.75, potassium 4.0 -COVID test is still positive----Patient was apparently treated with molnupiravir starting on 06/22/2023 for positive COVID test, many residents at facility with COVID -Flu and RSV negative    Review of systems:    In addition to the HPI above,   A full Review of  Systems was done, all other systems reviewed are negative except as noted above in HPI , .    Social History:  Reviewed by me    Social History   Tobacco Use   Smoking status: Former    Current packs/day: 0.00    Average packs/day: 2.0 packs/day for 10.0 years (20.0 ttl pk-yrs)    Types: Cigarettes    Start date: 05/24/1979    Quit date: 05/23/1989    Years since quitting: 34.1    Passive exposure: Past   Smokeless tobacco: Never   Tobacco comments:    Verified by Granddaughter, [name]  Substance Use Topics   Alcohol use: No    Alcohol/week: 0.0 standard drinks of alcohol     Family History :  Reviewed by me    Family History  Problem Relation Age of Onset   Cancer Mother        Colon    Coronary artery disease Sister    Coronary artery disease Brother    Arthritis Other    Lung disease Other    Asthma Other      Home Medications:   Prior to Admission medications   Medication Sig Start Date End Date Taking? Authorizing Provider  ALPRAZolam Prudy Feeler) 0.5 MG tablet Take 0.5 mg by mouth at bedtime as needed for anxiety or sleep. 06/29/23  Yes [provider]  PARoxetine (PAXIL) 10 MG tablet Take 10 mg by mouth daily. 06/26/23  Yes [provider]  acetaminophen (TYLENOL) 325 MG tablet Take 2 tablets (650 mg total) by mouth every 6 (six) hours as needed for mild pain (or Fever >/= 101). 11/25/22   Allante Whitmire, MD  albuterol (PROVENTIL) (2.5  MG/3ML) 0.083% nebulizer solution Take 3 mLs (2.5 mg total) by nebulization every 4 (four) hours as needed for wheezing or shortness of breath. 07/02/22 07/02/23  Shon Hale, MD  albuterol (VENTOLIN HFA) 108 (90  Base) MCG/ACT inhaler Inhale 1 puff into the lungs every 4 (four) hours as needed for wheezing or shortness of breath.    [provider]  amiodarone (PACERONE) 200 MG tablet Take 1 tablet (200 mg total) by mouth daily. 03/16/23   Miguel Rota, MD  aspirin EC 81 MG tablet Take 1 tablet (81 mg total) by mouth daily with breakfast. Swallow whole. 07/02/22   Shon Hale, MD  atorvastatin (LIPITOR) 80 MG tablet Take 1 tablet (80 mg total) by mouth daily. 02/06/23   Jonelle Sidle, MD  bisacodyl (DULCOLAX) 10 MG suppository Place 10 mg rectally once.    [provider]  Cyanocobalamin (VITAMIN B-12) 5000 MCG SUBL Take 5,000 mcg by mouth daily.    [provider]  dicyclomine (BENTYL) 10 MG capsule Take 1 capsule (10 mg total) by mouth every 12 (twelve) hours as needed for spasms (abdominal pain). Patient not taking: Reported on 06/05/2023 12/08/22   Marguerita Merles, Reuel Boom, MD  empagliflozin (JARDIANCE) 25 MG TABS tablet Take 25 mg by mouth daily. Patient not taking: Reported on 06/05/2023    [provider]  ertugliflozin L-PyroglutamicAc (STEGLATRO) 15 MG TABS tablet Take 15 mg by mouth daily.    [provider]  folic acid (FOLVITE) 1 MG tablet Take 1 tablet (1 mg total) by mouth daily. 11/12/22   Catarina Hartshorn, MD  furosemide (LASIX) 40 MG tablet Take 1 tablet (40 mg total) by mouth daily. 03/16/23   Amin, Ankit C, MD  guaifenesin (ROBITUSSIN) 100 MG/5ML syrup Take 200 mg by mouth every 6 (six) hours as needed for cough.    [provider]  HYDROcodone-acetaminophen (NORCO) 7.5-325 MG tablet Take 1 tablet by mouth every 8 (eight) hours as needed for moderate pain. Patient not taking: Reported on 06/05/2023 12/26/22   [provider]  isosorbide mononitrate (IMDUR) 30 MG 24 hr tablet Take 1 tablet (30 mg total) by mouth daily. 03/16/23   Amin, Ankit C, MD  LAGEVRIO 200 MG CAPS capsule Take 4 capsules by mouth 2 (two) times daily. 06/22/23   [provider]  LORazepam (ATIVAN) 0.5 MG tablet Take 1 tablet (0.5 mg total) by mouth every 8 (eight) hours as needed for anxiety. Patient taking differently: Take 0.5 mg by mouth every 6 (six) hours as needed for anxiety. 03/15/23   Amin, Ankit C, MD  melatonin 5 MG TABS Take 5 mg by mouth at bedtime.    [provider]  metoprolol succinate (TOPROL-XL) 50 MG 24 hr tablet Take 1 tablet (50 mg total) by mouth 2 (two) times daily. Take with or immediately following a meal. 03/15/23   Amin, Ankit C, MD  nitroGLYCERIN (NITROSTAT) 0.4 MG SL tablet Place 1 tablet (0.4 mg total) under the tongue every 5 (five) minutes x 3 doses as needed for chest pain. 02/06/23   Jonelle Sidle, MD  ondansetron (ZOFRAN) 4 MG tablet Take 4 mg by mouth every 6 (six) hours as needed for vomiting or nausea.    [provider]  OXYGEN Inhale 2 L into the lungs as needed (PRN as needed at night).    [provider]  pantoprazole (PROTONIX) 20 MG tablet Take 1 tablet (20 mg total) by mouth daily. 04/20/23   Dolores Frame, MD  polyethylene glycol (MIRALAX / GLYCOLAX) 17 g packet Take 17 g by mouth daily.    [provider]  Potassium Chloride ER 20 MEQ TBCR Take 1 tablet (20 mEq total) by  mouth daily. 1 tab daily by mouth 02/06/23 11/03/23  Jonelle Sidle, MD  ranolazine (RANEXA) 500 MG 12 hr tablet Take 1 tablet (500 mg total) by mouth 2 (two) times daily. 07/02/22   Shon Hale, MD  rOPINIRole (REQUIP) 0.5 MG tablet Take 0.5 mg by mouth daily. 01/20/23   [provider]  senna-docusate (SENOKOT-S) 8.6-50 MG tablet Take 2 tablets by mouth at bedtime. 11/25/22 11/25/23  Shon Hale, MD  ticagrelor (BRILINTA) 60 MG TABS tablet Take 60 mg  by mouth 2 (two) times daily.    [provider]  vitamin E 180 MG (400 UNITS) capsule Take 400 Units by mouth daily.    [provider]     Allergies:     Allergies  Allergen Reactions   Amitriptyline Hcl Other (See Comments)    Caused "jaws to twist and lock"   Plavix [Clopidogrel Bisulfate] Hives   Sulfonamide Derivatives Other (See Comments)    UNKNOWN REACTION     Physical Exam:   Vitals  Blood pressure (!) 158/65, pulse 61, temperature 98.5 F (36.9 C), temperature source Axillary, resp. rate 20, height 5\' 5"  (1.651 m), weight 57 kg, SpO2 100%.  Physical Examination: General appearance - alert, respiratory distress, tachypnea Mental status - alert, oriented to person, place, and time,  Nose- [place] alternating with BiPAP Eyes - sclera anicteric Neck - supple, no JVD elevation , Chest -diminished sounds with scattered rhonchi and wheezes bilaterally  heart - S1 and S2 normal, globally regular, left subclavian area with pacemaker in situ Abdomen - soft, nontender, nondistended, +BS Neurological - screening mental status exam normal, neck supple without rigidity, cranial nerves II through XII intact, DTR's normal and symmetric Extremities - no pedal edema noted, intact peripheral pulses  Skin - warm, dry   Data Review:    CBC Recent Labs  Lab 07/05/23 0553 07/05/23 0641  WBC 12.3*  --   HGB 11.3* 13.3  HCT 38.3 39.0  PLT 266  --   MCV 99.0  --   MCH 29.2  --   MCHC 29.5*  --   RDW 15.7*  --   LYMPHSABS 1.6  --   MONOABS 1.0  --   EOSABS 0.0  --   BASOSABS 0.1  --    ------------------------------------------------------------------------------------------------------------------  Chemistries  Recent Labs  Lab 07/05/23 0553 07/05/23 0641  NA 139 140  K 4.0 4.3  CL 105 103  CO2 26  --   GLUCOSE 123* 110*  BUN 12 11  CREATININE 0.75 0.80  CALCIUM 9.2  --     ------------------------------------------------------------------------------------------------------------------ estimated creatinine clearance is 46.3 mL/min (by C-G formula based on SCr of 0.8 mg/dL). ------------------------------------------------------------------------------------------------------------------ ------------------------------------------------------------------------------------------------------------------    Component Value Date/Time   BNP 629.0 (H) 07/05/2023 0553   Urinalysis    Component Value Date/Time   COLORURINE STRAW (A) 03/11/2023 1829   APPEARANCEUR CLEAR 03/11/2023 1829   LABSPEC 1.014 03/11/2023 1829   PHURINE 7.0 03/11/2023 1829   GLUCOSEU >=500 (A) 03/11/2023 1829   HGBUR SMALL (A) 03/11/2023 1829   BILIRUBINUR NEGATIVE 03/11/2023 1829   KETONESUR NEGATIVE 03/11/2023 1829   PROTEINUR NEGATIVE 03/11/2023 1829   UROBILINOGEN 0.2 12/07/2009 1532   NITRITE NEGATIVE 03/11/2023 1829   LEUKOCYTESUR NEGATIVE 03/11/2023 1829     Imaging Results:    DG Chest Port 1 View  Result Date: 07/05/2023 CLINICAL DATA:  Shortness of breath and hypoxia EXAM: PORTABLE CHEST 1 VIEW COMPARISON:  03/11/2023 FINDINGS: Cardiomegaly and vascular pedicle widening with  diffuse interstitial opacity and cephalized blood flow. Small volume pleural fluid is possible on the right especially. Linear opacities in the right lung favoring atelectasis. No pneumothorax. Dual-chamber pacer leads from the left are in good position. IMPRESSION: CHF. Electronically Signed   By: Tiburcio Pea M.D.   On: 07/05/2023 05:36    Radiological Exams on Admission: DG Chest Port 1 View  Result Date: 07/05/2023 CLINICAL DATA:  Shortness of breath and hypoxia EXAM: PORTABLE CHEST 1 VIEW COMPARISON:  03/11/2023 FINDINGS: Cardiomegaly and vascular pedicle widening with diffuse interstitial opacity and cephalized blood flow. Small volume pleural fluid is possible on the right especially. Linear  opacities in the right lung favoring atelectasis. No pneumothorax. Dual-chamber pacer leads from the left are in good position. IMPRESSION: CHF. Electronically Signed   By: Tiburcio Pea M.D.   On: 07/05/2023 05:36    DVT Prophylaxis -SCD AM Labs Ordered, also please review Full Orders  Family Communication: Admission, patients condition and plan of care including tests being ordered have been discussed with the patient who indicate understanding and agree with the plan   Condition -stable  Shon Hale M.D on 07/05/2023 at 5:52 PM Go to [email or web address] -  for contact info  [facility] - Office  [phone]

## 2023-07-05 NOTE — ED Notes (Signed)
Decreased oxygen to 40 on BiPAP , rate 20 , 14/6 after blood gas drawn.

## 2023-07-05 NOTE — Plan of Care (Signed)
  Problem: Education: Goal: Knowledge of General Education information will improve Description: Including pain rating scale, medication(s)/side effects and non-pharmacologic comfort measures Outcome: Progressing   Problem: Health Behavior/Discharge Planning: Goal: Ability to manage health-related needs will improve Outcome: Progressing   Problem: Clinical Measurements: Goal: Ability to maintain clinical measurements within normal limits will improve Outcome: Progressing Goal: Will remain free from infection Outcome: Progressing Goal: Diagnostic test results will improve Outcome: Progressing Goal: Respiratory complications will improve Outcome: Progressing Goal: Cardiovascular complication will be avoided Outcome: Progressing   Problem: Activity: Goal: Risk for activity intolerance will decrease Outcome: Progressing   Problem: Nutrition: Goal: Adequate nutrition will be maintained Outcome: Progressing   Problem: Coping: Goal: Level of anxiety will decrease Outcome: Progressing   Problem: Elimination: Goal: Will not experience complications related to bowel motility Outcome: Progressing Goal: Will not experience complications related to urinary retention Outcome: Progressing   Problem: Pain Management: Goal: General experience of comfort will improve Outcome: Progressing   Problem: Safety: Goal: Ability to remain free from injury will improve Outcome: Progressing   Problem: Skin Integrity: Goal: Risk for impaired skin integrity will decrease Outcome: Progressing   Problem: Education: Goal: Knowledge of risk factors and measures for prevention of condition will improve Outcome: Progressing   Problem: Coping: Goal: Psychosocial and spiritual needs will be supported Outcome: Progressing   Problem: Respiratory: Goal: Will maintain a patent airway Outcome: Progressing Goal: Complications related to the disease process, condition or treatment will be avoided or  minimized Outcome: Progressing

## 2023-07-06 DIAGNOSIS — U071 COVID-19: Secondary | ICD-10-CM | POA: Diagnosis not present

## 2023-07-06 LAB — BASIC METABOLIC PANEL
Anion gap: 10 (ref 5–15)
BUN: 20 mg/dL (ref 8–23)
CO2: 24 mmol/L (ref 22–32)
Calcium: 9.2 mg/dL (ref 8.9–10.3)
Chloride: 101 mmol/L (ref 98–111)
Creatinine, Ser: 0.85 mg/dL (ref 0.44–1.00)
GFR, Estimated: >60 mL/min (ref >60)
Glucose, Bld: 147 mg/dL — ABNORMAL HIGH (ref 70–99)
Potassium: 3.8 mmol/L (ref 3.5–5.1)
Sodium: 135 mmol/L (ref 135–145)

## 2023-07-06 LAB — CBC
HCT: 32.4 % — ABNORMAL LOW (ref 36.0–46.0)
Hemoglobin: 10.4 g/dL — ABNORMAL LOW (ref 12.0–15.0)
MCH: 29.9 pg (ref 26.0–34.0)
MCHC: 32.1 g/dL (ref 30.0–36.0)
MCV: 93.1 fL (ref 80.0–100.0)
Platelets: 274 10*3/uL (ref 150–400)
RBC: 3.48 MIL/uL — ABNORMAL LOW (ref 3.87–5.11)
RDW: 15.8 % — ABNORMAL HIGH (ref 11.5–15.5)
WBC: 10.5 10*3/uL (ref 4.0–10.5)
nRBC: 0 % (ref 0.0–0.2)

## 2023-07-06 MED ORDER — PANTOPRAZOLE SODIUM 40 MG PO TBEC
40.0000 mg | DELAYED_RELEASE_TABLET | Freq: Every day | ORAL | Status: DC
  Administered 2023-07-06 – 2023-07-07 (×2): 40 mg via ORAL
  Filled 2023-07-06 (×2): qty 1

## 2023-07-06 MED ORDER — ORAL CARE MOUTH RINSE: 15.0000 mL | OROMUCOSAL | Status: DC | PRN

## 2023-07-06 NOTE — Progress Notes (Signed)
   07/06/23 2020  Assess: MEWS Score  Temp 99.1 F (37.3 C)  BP (!) 144/77  MAP (mmHg) 95  Pulse Rate 66  ECG Heart Rate 66  Resp (!) 27  SpO2 97 %  O2 Device Nasal Cannula  FiO2 (%) (!) 2 %  Assess: MEWS Score  MEWS Temp 0  MEWS Systolic 0  MEWS Pulse 0  MEWS RR 2  MEWS LOC 0  MEWS Score 2  MEWS Score Color Yellow  Assess: if the MEWS score is Yellow or Red  Were vital signs accurate and taken at a resting state? Yes  Does the patient meet 2 or more of the SIRS criteria? No  MEWS guidelines implemented  Yes, yellow  Treat  MEWS Interventions Considered administering scheduled or prn medications/treatments as ordered  Take Vital Signs  Increase Vital Sign Frequency  Yellow: Q2hr x1, continue Q4hrs until patient remains green for 12hrs  Escalate  MEWS: Escalate Yellow: Discuss with charge nurse and consider notifying provider and/or RRT  Notify: Charge Nurse/RN  Name of Charge Nurse/RN Notified Rosalyn Charters  Provider Notification  Provider Name/Title Dr. Thomes Dinning  Date Provider Notified 07/06/23  Time Provider Notified 2025  Method of Notification Page  Notification Reason Other (Comment) (yellow mews)  Provider response No new orders  Date of Provider Response 07/06/23  Assess: SIRS CRITERIA  SIRS Temperature  0  SIRS Pulse 0  SIRS Respirations  1  SIRS WBC 0  SIRS Score Sum  1   No new orders at this time.

## 2023-07-06 NOTE — Plan of Care (Signed)

## 2023-07-06 NOTE — TOC Initial Note (Signed)
Transition of Care [facility]) - Initial/Assessment Note    Patient Details  Name: [name] MRN: [id number] Date of Birth: 19-Nov-1935  Transition of Care [facility]) CM/SW Contact:    Villa Herb, LCSWA Phone Number: 07/06/2023, 11:04 AM  Clinical Narrative:                 Pt is high risk for readmission. CSW noted pt is long term resident at [facility]. CSW spoke to [place] with admissions who states they can accept pt back once medically stable. CSW left secure vm for pts legal guardian requesting call back. TOC to follow.   Expected Discharge Plan: Long Term Nursing Home Barriers to Discharge: Continued Medical Work up   Patient Goals and CMS Choice Patient states their goals for this hospitalization and ongoing recovery are:: return to LTC CMS Medicare.gov Compare Post Acute Care list provided to:: Legal Guardian Choice offered to / list presented to : [facility] POA / Guardian      Expected Discharge Plan and Services In-house Referral: Clinical Social Work Discharge Planning Services: CM Consult Post Acute Care Choice: Nursing Home Living arrangements for the past 2 months: Skilled Nursing Facility                                      Prior Living Arrangements/Services Living arrangements for the past 2 months: Skilled Nursing Facility Lives with:: Facility Resident Patient language and need for interpreter reviewed:: Yes Do you feel safe going back to the place where you live?: Yes      Need for Family Participation in Patient Care: Yes (Comment) Care giver support system in place?: Yes (comment)   Criminal Activity/Legal Involvement Pertinent to Current Situation/Hospitalization: No - Comment as needed  Activities of Daily Living   ADL Screening (condition at time of admission) Independently performs ADLs?: Yes (appropriate for developmental age) Is the patient deaf or have difficulty hearing?: Yes Does the patient have difficulty seeing, even when wearing  glasses/contacts?: Yes Does the patient have difficulty concentrating, remembering, or making decisions?: Yes  Permission Sought/Granted                  Emotional Assessment         Alcohol / Substance Use: Not Applicable Psych Involvement: No (comment)  Admission diagnosis:  Acute and chronic respiratory failure with hypoxia (HCC) [J96.21] Acute on chronic congestive heart failure, unspecified heart failure type (HCC) [I50.9] COVID-19 [U07.1] Patient Active Problem List   Diagnosis Date Noted   COVID-19 07/05/2023   CHF (congestive heart failure) (HCC) 03/12/2023   NSTEMI (non-ST elevated myocardial infarction) (HCC) 03/11/2023   Dehydration 11/25/2022   Failure to thrive in adult 11/25/2022   Acute metabolic encephalopathy 11/06/2022   Frequent falls 11/06/2022   Right sided weakness 11/05/2022   Paranoid delusion (HCC) 10/28/2022   Current moderate episode of major depressive disorder (HCC) 10/28/2022   Community acquired pneumonia 10/27/2022   Hardening of the aorta (main artery of the heart) (HCC) 10/27/2022   Pruritic rash 10/27/2022   Generalized abdominal pain 09/29/2022   COPD (chronic obstructive pulmonary disease) (HCC) 06/29/2022   Chronic respiratory failure with hypoxia (HCC) 06/29/2022   Chronic systolic heart failure (HCC) 06/29/2022   IPMN (intraductal papillary mucinous neoplasm) 06/09/2022   Constipation 06/09/2022   Atherosclerosis of coronary artery without angina pectoris 04/23/2022   Abdominal pain, chronic, right upper quadrant 12/03/2021  Hearing difficulty 06/19/2021   Neck pain 05/16/2021   CAD (coronary artery disease) 05/02/2021   Nausea without vomiting 04/17/2021   Productive cough 04/17/2021   Chest pain 03/30/2021   COVID-19 virus infection 03/30/2021   Non-STEMI (non-ST elevated myocardial infarction) (HCC)    Mass of pancreas 03/20/2021   Hypertensive urgency 03/19/2021   Elevated troponin 03/19/2021   Edema of lower  extremity 01/14/2021   Impaired fasting glucose 01/07/2021   Generalized anxiety disorder 01/07/2021   Essential tremor 01/07/2021   Proteinuria 01/07/2021   Essential hypertension 01/02/2021   Iron deficiency anemia 01/02/2021   Prediabetes 01/02/2021   Difficulty swallowing 07/03/2020   Nonspecific chest pain 09/15/2016   Complete rotator cuff tear of left shoulder    Anemia 10/31/2014   H/O heart artery stent 10/30/2014   Chest pain with moderate risk for cardiac etiology 08/23/2014   Diastolic dysfunction with chronic heart failure (HCC) 08/23/2014   Chronic diastolic heart failure/GD11--EF 60 to 65 % 08/23/2014   Abdominal pain 08/23/2014   Acquired obstruction of both nasolacrimal ducts 06/15/2014   Lower abdominal pain 01/27/2012   Hypokalemia 06/03/2011   Disease of pericardium 07/10/2009   Disease of pericardium 07/10/2009   Essential hypertension, benign 04/18/2009   Triple vessel disease of the heart 04/18/2009   Benign essential hypertension 04/18/2009   PPM-Medtronic 04/04/2009   Sinus node dysfunction (HCC) 01/26/2009   HLD (hyperlipidemia) 12/19/2008   Anxiety 12/19/2008   Persistent atrial fibrillation (HCC) 12/19/2008   GERD (gastroesophageal reflux disease) 12/19/2008   Abnormal involuntary movements 12/19/2008   Atrial fibrillation, chronic (HCC) 12/19/2008   Mixed hyperlipidemia 12/19/2008   PCP:  [name], MD Pharmacy:   [facility] [place], [place] - [address] [place] [address] Phone: [phone] Fax: [phone]  [facility] [place], [place] - [address] [place] [address] Phone: [phone] Fax: [phone]     Social Determinants of Health (SDOH) Social History: SDOH Screenings   Food Insecurity: No Food Insecurity (07/05/2023)  Housing: Low Risk  (07/05/2023)  Transportation Needs: No Transportation Needs (07/05/2023)  Utilities: Not At Risk  (07/05/2023)  Alcohol Screen: Low Risk  (11/05/2022)  Depression (PHQ2-9): Low Risk  (11/05/2022)  Financial Resource Strain: Low Risk  (11/05/2022)  Physical Activity: Inactive (11/05/2022)  Social Connections: Moderately Integrated (11/05/2022)  Stress: No Stress Concern Present (11/05/2022)  Tobacco Use: Medium Risk (07/05/2023)   SDOH Interventions:     Readmission Risk Interventions    07/06/2023   11:03 AM 07/02/2022   12:34 PM  Readmission Risk Prevention Plan  Transportation Screening Complete Complete  PCP or Specialist Appt within 5-7 Days  Complete  Home Care Screening  Complete  Medication Review (RN CM)  Complete  HRI or Home Care Consult Complete   Social Work Consult for Recovery Care Planning/Counseling Complete   Palliative Care Screening Not Applicable   Medication Review Oceanographer) Complete

## 2023-07-06 NOTE — Progress Notes (Signed)
PROGRESS NOTE  [name], is a 87 y.o. female, DOB - 06-26-1936, NWG:[id number]  Admit date - 07/05/2023   Admitting Physician Tara Wich Mariea Clonts, MD  Outpatient Primary MD for the patient is [name], MD  LOS - 1  Chief Complaint  Patient presents with   Respiratory Distress      Brief Narrative:  87 y.o. female with past medical history relevant for diastolic dysfunction CHF, chronic atrial fibrillation, CAD with prior angioplasty and stenting, COPD, GERD and HLD, HTN, and pacemaker in situ for tachybradycardia syndrome, history of chronic hypoxic respiratory failure usually uses 4 L of oxygen at baseline admitted on 07/05/2023 from [facility] SNF facility with worsening shortness of breath and found to have CHF exacerbation in the setting of recent COVID-19 infection diagnosed initially on 06/22/2023    -Assessment and Plan: 1)Acute on chronic diastolic dysfunction CHF exacerbation--POA -Echo from 06/09/2023 with EF of 60 to 65%, grade 2 diastolic dysfunction mild to moderate MR no MS, no aortic stenosis --Chest x-ray suggest CHF ,BNP 629 up from 148 about 3 months ago -Initial troponin 8 repeat troponin 9, EKG is atrial paced rhythm 07/06/23 -Hypoxia and dyspnea improving with diuresis -c/n IV Lasix, daily weights, fluid input and output monitoring and Reds vest as requested --   2)Chronic atrial fibrillation/tachybradycardia syndrome--status post pacemaker placement ---EKG is atrial paced rhythm --Continue amiodarone and metoprolol for rate control Patient-Is not on Eliquis due to need to be on DAPT   3)CAD with prior angioplasty and stenting--continue aspirin and Brilinta\-continue metoprolol, Ranexa and atorvastatin   4)Covid 19--COVID test is still positive----Patient was apparently treated with molnupiravir starting on 06/22/2023 for positive COVID test, reportedly many residents at her SNF facility with COVID -Flu and RSV negative -Continue Solu-Medrol, bronchodilators  and mucolytics -Supportive care   5) acute on chronic hypoxic respiratory failure----mostly due to #1 above -ABG noted without hypercapnia 07/06/23 -Weaned off BiPAP -Currently requiring 4 L of oxygen via nasal cannula -Okay to transfer out of stepdown to telemetry unit    6) hypertensive urgency--- successfully weaned off nitro drip -Okay to transfer out of stepdown to telemetry unit -Continue isosorbide, metoprolol Lasix   7)Social/Ethics--patient has a MOST form from facility, reviewed with patient, patient remains a DNR   8)GERD--- continue Protonix   Status is: Inpatient  Disposition: The patient is from: SNF              Anticipated d/c is to: SNF              Anticipated d/c date is: 1 day              Patient currently is not medically stable to d/c. Barriers: Not Clinically Stable-   Code Status :  -  Code Status: Limited: Do not attempt resuscitation (DNR) -DNR-LIMITED -Do Not Intubate/DNI    Family Communication:   (patient is alert, awake and coherent)   DVT Prophylaxis  :   - SCDs  heparin injection 5,000 Units Start: 07/05/23 2200 SCDs Start: 07/05/23 1355 Place TED hose Start: 07/05/23 1355   Lab Results  Component Value Date   PLT 274 07/06/2023    Inpatient Medications  Scheduled Meds:  amiodarone  200 mg Oral Daily   aspirin EC  81 mg Oral Q breakfast   atorvastatin  80 mg Oral Daily   Chlorhexidine Gluconate Cloth  6 each Topical Q0600   vitamin B-12  5,000 mcg Oral Daily   folic acid  1 mg Oral  Daily   furosemide  40 mg Intravenous Daily   guaiFENesin  600 mg Oral BID   heparin  5,000 Units Subcutaneous Q8H   ipratropium-albuterol  3 mL Nebulization TID   isosorbide mononitrate  30 mg Oral Daily   methylPREDNISolone (SOLU-MEDROL) injection  40 mg Intravenous Q12H   metoprolol succinate  50 mg Oral BID   mupirocin ointment   Nasal BID   pantoprazole  40 mg Oral Daily   PARoxetine  10 mg Oral Daily   polyethylene glycol  17 g Oral Daily    ranolazine  500 mg Oral BID   senna-docusate  2 tablet Oral QHS   sodium chloride flush  3 mL Intravenous Q12H   sodium chloride flush  3 mL Intravenous Q12H   ticagrelor  60 mg Oral BID   Continuous Infusions:  nitroGLYCERIN Stopped (07/05/23 1255)   PRN Meds:.acetaminophen **OR** acetaminophen, albuterol, bisacodyl, ondansetron **OR** ondansetron (ZOFRAN) IV, mouth rinse, polyethylene glycol, sodium chloride flush   Anti-infectives (From admission, onward)    Start     Dose/Rate Route Frequency Ordered Stop   07/05/23 1445  molnupiravir EUA (LAGEVRIO) capsule 800 mg  Status:  Discontinued        4 capsule Oral 2 times daily 07/05/23 1357 07/05/23 1758   07/05/23 1200  nirmatrelvir/ritonavir (renal dosing) (PAXLOVID) 2 tablet  Status:  Discontinued        2 tablet Oral 2 times daily 07/05/23 1024 07/05/23 1040         Subjective: Jasmine December today has no fevers, no emesis,  No chest pain,   - Cough and dyspnea improving --Voiding well   Objective: Vitals:   07/06/23 1200 07/06/23 1209 07/06/23 1300 07/06/23 1315  BP: (!) 136/54  (!) 125/53   Pulse: 61  65   Resp: (!) 25  (!) 32   Temp:  (!) 97.5 F (36.4 C)    TempSrc:  Oral    SpO2: 98%  98% 99%  Weight:      Height:        Intake/Output Summary (Last 24 hours) at 07/06/2023 1514 Last data filed at 07/06/2023 1053 Gross per 24 hour  Intake 38.81 ml  Output 1000 ml  Net -961.19 ml   Filed Weights   07/05/23 1200 07/05/23 2307 07/06/23 0500  Weight: 57 kg 58.8 kg 58.4 kg    Physical Exam Gen:- Awake Alert, in no acute distress HEENT:- Jack.AT, No sclera icterus Ears---HOH Nose -[place] 4L/min Neck-Supple Neck,No JVD,.  Lungs-air movement improving, no significant wheezing, faint bibasilar Rales noted CV- S1, S2 normal, irregular  Abd-  +ve B.Sounds, Abd Soft, No tenderness,    Extremity/Skin:- No  edema, pedal pulses present  Psych-affect is appropriate, oriented x3 Neuro-generalized weakness, no new focal  deficits, no tremors  Data Reviewed: I have personally reviewed following labs and imaging studies  CBC: Recent Labs  Lab 07/05/23 0553 07/05/23 0641 07/06/23 0410  WBC 12.3*  --  10.5  NEUTROABS 9.6*  --   --   HGB 11.3* 13.3 10.4*  HCT 38.3 39.0 32.4*  MCV 99.0  --  93.1  PLT 266  --  274   Basic Metabolic Panel: Recent Labs  Lab 07/05/23 0553 07/05/23 0641 07/06/23 0410  NA 139 140 135  K 4.0 4.3 3.8  CL 105 103 101  CO2 26  --  24  GLUCOSE 123* 110* 147*  BUN 12 11 20   CREATININE 0.75 0.80 0.85  CALCIUM 9.2  --  9.2   GFR: Estimated Creatinine Clearance: 43.5 mL/min (by C-G formula based on SCr of 0.85 mg/dL).  Recent Results (from the past 240 hour(s))  Resp panel by RT-PCR (RSV, Flu A&B, Covid) Anterior Nasal Swab     Status: Abnormal   Collection Time: 07/05/23  8:46 AM   Specimen: Anterior Nasal Swab  Result Value Ref Range Status   SARS Coronavirus 2 by RT PCR POSITIVE (A) NEGATIVE Final    Comment: (NOTE) SARS-CoV-2 target nucleic acids are DETECTED.  The SARS-CoV-2 RNA is generally detectable in upper respiratory specimens during the acute phase of infection. Positive results are indicative of the presence of the identified virus, but do not rule out bacterial infection or co-infection with other pathogens not detected by the test. Clinical correlation with patient history and other diagnostic information is necessary to determine patient infection status. The expected result is Negative.  Fact Sheet for Patients: BloggerCourse.com  Fact Sheet for Healthcare Providers: SeriousBroker.it  This test is not yet approved or cleared by the [place] FDA and  has been authorized for detection and/or diagnosis of SARS-CoV-2 by FDA under an Emergency Use Authorization (EUA).  This EUA will remain in effect (meaning this test can be used) for the duration of  the COVID-19 declaration under Section  564(b)(1) of the A ct, 21 U.S.C. section 360bbb-3(b)(1), unless the authorization is terminated or revoked sooner.     Influenza A by PCR NEGATIVE NEGATIVE Final   Influenza B by PCR NEGATIVE NEGATIVE Final    Comment: (NOTE) The Xpert Xpress SARS-CoV-2/FLU/RSV plus assay is intended as an aid in the diagnosis of influenza from Nasopharyngeal swab specimens and should not be used as a sole basis for treatment. Nasal washings and aspirates are unacceptable for Xpert Xpress SARS-CoV-2/FLU/RSV testing.  Fact Sheet for Patients: BloggerCourse.com  Fact Sheet for Healthcare Providers: SeriousBroker.it  This test is not yet approved or cleared by the [place] FDA and has been authorized for detection and/or diagnosis of SARS-CoV-2 by FDA under an Emergency Use Authorization (EUA). This EUA will remain in effect (meaning this test can be used) for the duration of the COVID-19 declaration under Section 564(b)(1) of the Act, 21 U.S.C. section 360bbb-3(b)(1), unless the authorization is terminated or revoked.     Resp Syncytial Virus by PCR NEGATIVE NEGATIVE Final    Comment: (NOTE) Fact Sheet for Patients: BloggerCourse.com  Fact Sheet for Healthcare Providers: SeriousBroker.it  This test is not yet approved or cleared by the [place] FDA and has been authorized for detection and/or diagnosis of SARS-CoV-2 by FDA under an Emergency Use Authorization (EUA). This EUA will remain in effect (meaning this test can be used) for the duration of the COVID-19 declaration under Section 564(b)(1) of the Act, 21 U.S.C. section 360bbb-3(b)(1), unless the authorization is terminated or revoked.  Performed at [facility], [address] [place], [place] [address]   MRSA Next Gen by PCR, Nasal     Status: Abnormal   Collection Time: 07/05/23 12:00 PM   Specimen: Nasal Mucosa; Nasal  Swab  Result Value Ref Range Status   MRSA by PCR Next Gen DETECTED (A) NOT DETECTED Final    Comment: RESULT CALLED TO, READ BACK BY AND VERIFIED WITH: J RAFTER AT 1614 ON [id number] BY S DALTON (NOTE) The GeneXpert MRSA Assay (FDA approved for NASAL specimens only), is one component of a comprehensive MRSA colonization surveillance program. It is not intended to diagnose MRSA infection nor to guide or monitor  treatment for MRSA infections. Test performance is not FDA approved in patients less than 43 years old. Performed at [facility], [address] [place], [place] [address]     Radiology Studies: [facility] Chest [facility] 1 View  Result Date: 07/05/2023 CLINICAL DATA:  Shortness of breath and hypoxia EXAM: PORTABLE CHEST 1 VIEW COMPARISON:  03/11/2023 FINDINGS: Cardiomegaly and vascular pedicle widening with diffuse interstitial opacity and cephalized blood flow. Small volume pleural fluid is possible on the right especially. Linear opacities in the right lung favoring atelectasis. No pneumothorax. Dual-chamber pacer leads from the left are in good position. IMPRESSION: CHF. Electronically Signed   By: Tiburcio Pea M.D.   On: 07/05/2023 05:36     Scheduled Meds:  amiodarone  200 mg Oral Daily   aspirin EC  81 mg Oral Q breakfast   atorvastatin  80 mg Oral Daily   Chlorhexidine Gluconate Cloth  6 each Topical Q0600   vitamin B-12  5,000 mcg Oral Daily   folic acid  1 mg Oral Daily   furosemide  40 mg Intravenous Daily   guaiFENesin  600 mg Oral BID   heparin  5,000 Units Subcutaneous Q8H   ipratropium-albuterol  3 mL Nebulization TID   isosorbide mononitrate  30 mg Oral Daily   methylPREDNISolone (SOLU-MEDROL) injection  40 mg Intravenous Q12H   metoprolol succinate  50 mg Oral BID   mupirocin ointment   Nasal BID   pantoprazole  40 mg Oral Daily   PARoxetine  10 mg Oral Daily   polyethylene glycol  17 g Oral Daily   ranolazine  500 mg Oral BID   senna-docusate  2 tablet Oral QHS    sodium chloride flush  3 mL Intravenous Q12H   sodium chloride flush  3 mL Intravenous Q12H   ticagrelor  60 mg Oral BID   Continuous Infusions:  nitroGLYCERIN Stopped (07/05/23 1255)    LOS: 1 day   Shon Hale M.D on 07/06/2023 at 3:14 PM  Go to [email or web address] - for contact info  [facility] - Office  [phone]  If 7PM-7AM, please contact night-coverage [email or web address] 07/06/2023, 3:14 PM

## 2023-07-06 NOTE — Plan of Care (Signed)
  Problem: Nutrition: Goal: Adequate nutrition will be maintained Outcome: Progressing   Problem: Nutrition: Goal: Adequate nutrition will be maintained Outcome: Progressing   

## 2023-07-07 ENCOUNTER — Ambulatory Visit: Payer: 59 | Admitting: Student

## 2023-07-07 ENCOUNTER — Inpatient Hospital Stay (HOSPITAL_COMMUNITY): Payer: 59

## 2023-07-07 DIAGNOSIS — U071 COVID-19: Secondary | ICD-10-CM | POA: Diagnosis not present

## 2023-07-07 MED ORDER — FUROSEMIDE 20 MG PO TABS: 20.0000 mg | ORAL_TABLET | Freq: Every evening | ORAL | 3 refills | Status: AC

## 2023-07-07 MED ORDER — FUROSEMIDE 40 MG PO TABS: 40.0000 mg | ORAL_TABLET | Freq: Every day | ORAL | 3 refills | Status: DC

## 2023-07-07 MED ORDER — TICAGRELOR 60 MG PO TABS: 60.0000 mg | ORAL_TABLET | Freq: Two times a day (BID) | ORAL | 5 refills | Status: DC

## 2023-07-07 MED ORDER — ALPRAZOLAM 0.5 MG PO TABS: 0.5000 mg | ORAL_TABLET | Freq: Every evening | ORAL | 0 refills | Status: DC | PRN

## 2023-07-07 MED ORDER — ALBUTEROL SULFATE HFA 108 (90 BASE) MCG/ACT IN AERS: 1.0000 | INHALATION_SPRAY | RESPIRATORY_TRACT | 1 refills | Status: AC | PRN

## 2023-07-07 MED ORDER — BISACODYL 10 MG RE SUPP: 10.0000 mg | Freq: Every day | RECTAL | 0 refills | Status: DC | PRN

## 2023-07-07 MED ORDER — ALBUTEROL SULFATE (2.5 MG/3ML) 0.083% IN NEBU: 2.5000 mg | INHALATION_SOLUTION | RESPIRATORY_TRACT | 2 refills | Status: AC | PRN

## 2023-07-07 MED ORDER — PREDNISONE 20 MG PO TABS: 20.0000 mg | ORAL_TABLET | Freq: Every day | ORAL | 0 refills | Status: AC

## 2023-07-07 NOTE — Discharge Summary (Signed)
[name], is a 87 y.o. female  DOB June 27, 1936  MRN [id number].  Admission date:  07/05/2023  Admitting Physician  Shon Hale, MD  Discharge Date:  07/07/2023   Primary MD  [name], MD  Recommendations for primary care physician for things to follow:  1)Very low-salt diet advised----less than 2 g of sodium per day 2)Weigh yourself daily, call if you gain more than 3 pounds in 1 day or more than 5 pounds in 1 week as your diuretic medications may need to be adjusted 3)change Lasix/furosemide to 40 mg every morning and 20 mg every evening  Admission Diagnosis  Acute and chronic respiratory failure with hypoxia (HCC) [J96.21] Acute on chronic congestive heart failure, unspecified heart failure type (HCC) [I50.9] COVID-19 [U07.1]   Discharge Diagnosis  Acute and chronic respiratory failure with hypoxia (HCC) [J96.21] Acute on chronic congestive heart failure, unspecified heart failure type (HCC) [I50.9] COVID-19 [U07.1]    Principal Problem:   COVID-19 Active Problems:   COPD (chronic obstructive pulmonary disease) (HCC)   Chronic respiratory failure with hypoxia (HCC)   Chronic diastolic heart failure/GD11--EF 60 to 65 %   Atrial fibrillation, chronic (HCC)   Essential hypertension, benign   Persistent atrial fibrillation (HCC)   CAD (coronary artery disease)      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    Essential hypertension    GERD (gastroesophageal reflux disease)    H/O hiatal hernia    Headache(784.0)    HOH (hard of hearing)    Hyperlipidemia    Neuromuscular disorder (HCC)    Tremors   Pericardial effusion     Hemorrhagic    Presence of permanent cardiac pacemaker    Tachycardia-bradycardia syndrome (HCC)    s/p Medtronic Adapta L dual chamber device  5/10    Past Surgical History:  Procedure Laterality Date   APPENDECTOMY     BIOPSY  02/24/2022   Procedure: BIOPSY;  Surgeon: Lemar Lofty., MD;  Location: [facility];  Service: Gastroenterology;;   CARDIAC CATHETERIZATION  2010   stents x2.   CATARACT EXTRACTION     CHOLECYSTECTOMY     COLONOSCOPY  2011   COLONOSCOPY N/A 10/19/2014   Procedure: COLONOSCOPY;  Surgeon: Malissa Hippo, MD;  Location: [facility];  Service: Endoscopy;  Laterality: N/A;  1030   CORONARY STENT INTERVENTION N/A 05/28/2021   Procedure: CORONARY STENT INTERVENTION;  Surgeon: Marykay Lex, MD;  Location: [facility] INVASIVE CV LAB;  Service: Cardiovascular;  Laterality: N/A;   CORONARY ULTRASOUND/IVUS N/A 05/28/2021   Procedure: Intravascular Ultrasound/IVUS;  Surgeon: Marykay Lex, MD;  Location: [facility] INVASIVE CV LAB;  Service: Cardiovascular;  Laterality: N/A;   ESOPHAGEAL  DILATION N/A 05/02/2020   Procedure: ESOPHAGEAL DILATION;  Surgeon: Malissa Hippo, MD;  [facility];  Service: Gastroenterology;  Laterality: N/A;   ESOPHAGOGASTRODUODENOSCOPY N/A 10/26/2014   Procedure: ESOPHAGOGASTRODUODENOSCOPY (EGD);  Surgeon: Malissa Hippo, MD;  [facility];  Service: Endoscopy;  Laterality: N/A;  230 - Dr. has lunch and learn   ESOPHAGOGASTRODUODENOSCOPY N/A 02/24/2022   Procedure: ESOPHAGOGASTRODUODENOSCOPY (EGD);  Surgeon: Lemar Lofty., MD;  Location: [facility];  Service: Gastroenterology;  Laterality: N/A;   ESOPHAGOGASTRODUODENOSCOPY (EGD) WITH PROPOFOL N/A 05/02/2020   Procedure: ESOPHAGOGASTRODUODENOSCOPY (EGD) WITH PROPOFOL;  Surgeon: Malissa Hippo, MD;  [facility];  Service: Gastroenterology;  Laterality: N/A;  250   Esophagogastroduodenoscopy with esophageal dilation  2004, 2006, 2007   EUS N/A  02/24/2022   Procedure: UPPER ENDOSCOPIC ULTRASOUND (EUS) LINEAR;  Surgeon: Lemar Lofty., MD;  Location: [facility];  Service: Gastroenterology;  Laterality: N/A;   FINE NEEDLE ASPIRATION  02/24/2022   Procedure: FINE NEEDLE ASPIRATION;  Surgeon: Meridee Score Netty Starring., MD;  Location: [facility];  Service: Gastroenterology;;  red path sent   GIVENS CAPSULE STUDY N/A 10/31/2014   Procedure: GIVENS CAPSULE STUDY;  Surgeon: Malissa Hippo, MD;  [facility];  Service: Endoscopy;  Laterality: N/A;  730 -- pacemaker--needs monitoring--outpatient bed   INSERT / REPLACE / REMOVE PACEMAKER  2010   LEFT HEART CATH AND CORONARY ANGIOGRAPHY N/A 03/21/2021   Procedure: LEFT HEART CATH AND CORONARY ANGIOGRAPHY;  Surgeon: Lyn Records, MD;  Location: [facility] INVASIVE CV LAB;  Service: Cardiovascular;  Laterality: N/A;   LEFT HEART CATHETERIZATION WITH CORONARY ANGIOGRAM N/A 11/17/2011   Procedure: LEFT HEART CATHETERIZATION WITH CORONARY ANGIOGRAM;  Surgeon: Tonny Bollman, MD;  Location: [facility] CATH LAB;  Service: Cardiovascular;  Laterality: N/A;   LEFT HEART CATHETERIZATION WITH CORONARY ANGIOGRAM N/A 09/26/2014   Procedure: LEFT HEART CATHETERIZATION WITH CORONARY ANGIOGRAM;  Surgeon: Marykay Lex, MD;  Location: [facility] CATH LAB;  Service: Cardiovascular;  Laterality: N/A;   PPM GENERATOR CHANGEOUT N/A 07/16/2022   Procedure: PPM GENERATOR CHANGEOUT;  Surgeon: Marinus Maw, MD;  Location: [facility] INVASIVE CV LAB;  Service: Cardiovascular;  Laterality: N/A;   Right rotator cuff repair     SAVORY DILATION N/A 02/24/2022   Procedure: SAVORY DILATION;  Surgeon: Meridee Score Netty Starring., MD;  Location: [facility];  Service: Gastroenterology;  Laterality: N/A;   SHOULDER ACROMIOPLASTY Right 05/30/2015   Procedure: RIGHT SHOULDER ACROMIOPLASTY;  Surgeon: Vickki Hearing, MD;  Location: [facility];  Service: Orthopedics;  Laterality: Right;   SHOULDER OPEN ROTATOR CUFF REPAIR Right 05/30/2015    Procedure: OPEN ROTATOR CUFF REPAIR RIGHT SHOULDER;  Surgeon: Vickki Hearing, MD;  Location: [facility];  Service: Orthopedics;  Laterality: Right;   SHOULDER OPEN ROTATOR CUFF REPAIR Left 10/22/2016   Procedure: ROTATOR CUFF REPAIR SHOULDER OPEN;  Surgeon: Vickki Hearing, MD;  Location: [facility];  Service: Orthopedics;  Laterality: Left;   Subxiphoid pericardial window  11/10   VAGINAL HYSTERECTOMY       HPI  from the history and physical done on the day of admission:   [name]  is a 87 y.o. female with past medical history relevant for diastolic dysfunction CHF, chronic atrial fibrillation, CAD with prior angioplasty and stenting, COPD, GERD and HLD, HTN, and pacemaker in situ for tachybradycardia syndrome, history of chronic hypoxic respiratory failure usually uses 4 L of oxygen at baseline presents by EMS from [facility] facility with worsening  shortness of breath -- In the ED she was very hypoxic and required BiPAP initially -She was also found to have severely elevated blood pressures and started on nitro drip -Chest x-ray suggest CHF ,BNP 629 up from 148 3 months ago -Initial troponin 8 repeat troponin 9, EKG is atrial paced rhythm WBC is 12.3 but apparently patient was recently treated with steroids, hemoglobin 11.3 which is close to prior baseline platelets 266 -ABG noted without hypercapnia -Creatinine 0.75, potassium 4.0 -COVID test is still positive----Patient was apparently treated with molnupiravir starting on 06/22/2023 for positive COVID test, many residents at facility with COVID -Flu and RSV negative    Hospital Course:     Brief Narrative:  87 y.o. female with past medical history relevant for diastolic dysfunction CHF, chronic atrial fibrillation, CAD with prior angioplasty and stenting, COPD, GERD and HLD, HTN, and pacemaker in situ for tachybradycardia syndrome, history of chronic hypoxic respiratory failure usually uses 4 L of oxygen at baseline admitted on  07/05/2023 from [facility] SN[facility] facility with worsening shortness of breath and found to have CHF exacerbation in the setting of recent COVID-19 infection diagnosed initially on 06/22/2023   -Assessment and Plan: 1)Acute on chronic diastolic dysfunction CHF exacerbation--POA -Echo from 06/09/2023 with EF of 60 to 65%, grade 2 diastolic dysfunction mild to moderate MR no MS, no aortic stenosis --Chest x-ray suggest CHF ,BNP 629 up from 148 about 3 months ago -Initial troponin 8 repeat troponin 9, EKG is atrial paced rhythm -CHF symptoms are not dyspnea improved with diuresis -Hypoxia resolved -Repeat chest x-ray on 07/24/2023 with resolution of CHF findings -Discharge on oral Lasix -Outpatient follow-up with cardiology team in January   2)Chronic atrial fibrillation/tachybradycardia syndrome--status post pacemaker placement ---EKG is atrial paced rhythm --Continue amiodarone and metoprolol for rate control Patient-Is not on Eliquis due to need to be on DAPT   3)CAD with prior angioplasty and stenting--continue aspirin and Brilinta, continue metoprolol, Ranexa and atorvastatin   4)Covid 19--COVID test is still positive----Patient was apparently treated with molnupiravir starting on 06/22/2023 for positive COVID test, reportedly many residents at her SNF facility with COVID -Flu and RSV negative =-Much after treatment with IV Solu-Medrol, bronchodilators and mucolytics -Okay to discharge on p.o. prednisone -Supportive care   5) acute on chronic hypoxic respiratory failure----mostly due to #1 above -ABG noted without hypercapnia -Weaned off BiPAP -After diuresis with IV Lasix and treatment with steroids hypoxia has resolved completely -Patient is currently on room air -It appears patient may be on O2 at facility prior to admission  -She is currently on room air and doing okay     6) hypertensive urgency--- successfully weaned off nitro drip -BP is stable -Continue isosorbide,  metoprolol Lasix   7)Social/Ethics--patient has a MOST form from facility, reviewed with patient, patient remains a DNR   8)GERD--- continue Protonix   Disposition: The patient is from: SNF              Anticipated d/c is to: SNF  Discharge Condition: Stable, without hypoxia  Follow UP   Follow-up Information     [name], MD. Schedule an appointment as soon as possible for a visit.   Specialty: Internal Medicine Contact information: [address] [name] [address] [phone]                Diet and Activity recommendation:  As advised  Discharge Instructions    Discharge Instructions     Call MD for:  difficulty  breathing, headache or visual disturbances   Complete by: As directed    Call MD for:  persistant dizziness or light-headedness   Complete by: As directed    Call MD for:  temperature >100.4   Complete by: As directed    Diet - low sodium heart healthy   Complete by: As directed    Diet Carb Modified   Complete by: As directed    Discharge instructions   Complete by: As directed    1)Very low-salt diet advised----less than 2 g of sodium per day 2)Weigh yourself daily, call if you gain more than 3 pounds in 1 day or more than 5 pounds in 1 week as your diuretic medications may need to be adjusted 3)change Lasix/furosemide to 40 mg every morning and 20 mg every evening   Increase activity slowly   Complete by: As directed          Discharge Medications     Allergies as of 07/07/2023       Reactions   Amitriptyline Hcl Other (See Comments)   Caused "jaws to twist and lock"   Plavix [clopidogrel Bisulfate] Hives   Sulfonamide Derivatives Other (See Comments)   UNKNOWN REACTION        Medication List     STOP taking these medications    clopidogrel 75 MG tablet Commonly known as: PLAVIX   Lagevrio 200 MG Caps capsule Generic drug: molnupiravir EUA   OXYGEN       TAKE these medications    acetaminophen 325 MG  tablet Commonly known as: TYLENOL Take 2 tablets (650 mg total) by mouth every 6 (six) hours as needed for mild pain (or Fever >/= 101).   albuterol (2.5 MG/3ML) 0.083% nebulizer solution Commonly known as: PROVENTIL Take 3 mLs (2.5 mg total) by nebulization every 4 (four) hours as needed for wheezing or shortness of breath.   albuterol 108 (90 Base) MCG/ACT inhaler Commonly known as: VENTOLIN HFA Inhale 1 puff into the lungs every 4 (four) hours as needed for wheezing or shortness of breath.   ALPRAZolam 0.5 MG tablet Commonly known as: XANAX Take 1 tablet (0.5 mg total) by mouth at bedtime as needed for anxiety or sleep.   amiodarone 200 MG tablet Commonly known as: PACERONE Take 1 tablet (200 mg total) by mouth daily.   aspirin EC 81 MG tablet Take 1 tablet (81 mg total) by mouth daily with breakfast. Swallow whole.   atorvastatin 80 MG tablet Commonly known as: Lipitor Take 1 tablet (80 mg total) by mouth daily.   bisacodyl 10 MG suppository Commonly known as: DULCOLAX Place 1 suppository (10 mg total) rectally daily as needed for moderate constipation. What changed:  when to take this reasons to take this   dicyclomine 10 MG capsule Commonly known as: BENTYL Take 1 capsule (10 mg total) by mouth every 12 (twelve) hours as needed for spasms (abdominal pain).   folic acid 1 MG tablet Commonly known as: FOLVITE Take 1 tablet (1 mg total) by mouth daily.   furosemide 40 MG tablet Commonly known as: LASIX Take 1 tablet (40 mg total) by mouth daily. What changed: Another medication with the same name was added. Make sure you understand how and when to take each.   furosemide 20 MG tablet Commonly known as: Lasix Take 1 tablet (20 mg total) by mouth every evening. What changed: You were already taking a medication with the same name, and this prescription was added. Make sure you understand  how and when to take each.   guaifenesin 100 MG/5ML syrup Commonly known as:  ROBITUSSIN Take 200 mg by mouth every 6 (six) hours as needed for cough.   isosorbide mononitrate 30 MG 24 hr tablet Commonly known as: IMDUR Take 1 tablet (30 mg total) by mouth daily.   Jardiance 25 MG Tabs tablet Generic drug: empagliflozin Take 25 mg by mouth daily.   melatonin 5 MG Tabs Take 5 mg by mouth at bedtime.   metoprolol succinate 50 MG 24 hr tablet Commonly known as: TOPROL-XL Take 1 tablet (50 mg total) by mouth 2 (two) times daily. Take with or immediately following a meal.   nitroGLYCERIN 0.4 MG SL tablet Commonly known as: NITROSTAT Place 1 tablet (0.4 mg total) under the tongue every 5 (five) minutes x 3 doses as needed for chest pain.   NUTRITIONAL SUPPLEMENT PO Take 237 mLs by mouth in the morning, at noon, and at bedtime.   ondansetron 4 MG tablet Commonly known as: ZOFRAN Take 4 mg by mouth every 6 (six) hours as needed for vomiting or nausea.   pantoprazole 20 MG tablet Commonly known as: PROTONIX Take 1 tablet (20 mg total) by mouth daily.   PARoxetine 10 MG tablet Commonly known as: PAXIL Take 10 mg by mouth daily.   polyethylene glycol 17 g packet Commonly known as: MIRALAX / GLYCOLAX Take 17 g by mouth daily.   Potassium Chloride ER 20 MEQ Tbcr Take 1 tablet (20 mEq total) by mouth daily. 1 tab daily by mouth   predniSONE 20 MG tablet Commonly known as: DELTASONE Take 1 tablet (20 mg total) by mouth daily with breakfast for 5 days.   ranolazine 500 MG 12 hr tablet Commonly known as: RANEXA Take 1 tablet (500 mg total) by mouth 2 (two) times daily.   rOPINIRole 0.5 MG tablet Commonly known as: REQUIP Take 0.5 mg by mouth daily.   senna-docusate 8.6-50 MG tablet Commonly known as: Senokot-S Take 2 tablets by mouth at bedtime.   ticagrelor 60 MG Tabs tablet Commonly known as: BRILINTA Take 1 tablet (60 mg total) by mouth 2 (two) times daily.   Vitamin B-12 5000 MCG Subl Take 5,000 mcg by mouth daily.   vitamin E 180 MG (400  UNITS) capsule Take 400 Units by mouth daily.       Major procedures and Radiology Reports - PLEASE review detailed and final reports for all details, in brief -   DG CHEST PORT 1 VIEW  Result Date: 07/07/2023 CLINICAL DATA:  87 year old female with shortness of breath. EXAM: PORTABLE CHEST 1 VIEW COMPARISON:  Portable chest 07/05/2023 and earlier. FINDINGS: Portable AP semi upright view at 0637 hours. Stable cardiomegaly and mediastinal contours. Stable large lung volumes. No pneumothorax, pulmonary edema, consolidation. Stable mild blunting of the right costophrenic angle. Chronic left chest dual lead cardiac pacemaker. Osteopenia. Stable visualized osseous structures. Paucity of bowel gas. IMPRESSION: Chronic cardiomegaly and pulmonary hyperinflation. No acute cardiopulmonary abnormality. Electronically Signed   By: Odessa Fleming M.D.   On: 07/07/2023 10:29   DG Chest Port 1 View  Result Date: 07/05/2023 CLINICAL DATA:  Shortness of breath and hypoxia EXAM: PORTABLE CHEST 1 VIEW COMPARISON:  03/11/2023 FINDINGS: Cardiomegaly and vascular pedicle widening with diffuse interstitial opacity and cephalized blood flow. Small volume pleural fluid is possible on the right especially. Linear opacities in the right lung favoring atelectasis. No pneumothorax. Dual-chamber pacer leads from the left are in good position. IMPRESSION: CHF. Electronically Signed  By: Tiburcio Pea M.D.   On: 07/05/2023 05:36   ECHOCARDIOGRAM COMPLETE  Result Date: 06/09/2023    ECHOCARDIOGRAM REPORT   Patient Name:   [name] Date of Exam: 06/09/2023 Medical Rec #:  [id number]    Height:       65.5 in Accession #:    [id number]   Weight:       123.0 lb Date of Birth:  1936-07-12    BSA:          1.618 m Patient Age:    87 years     BP:           124/62 mmHg Patient Gender: F            HR:           88 bpm. Exam Location:  [facility] Procedure: 2D Echo, 3D Echo, Color Doppler and Cardiac Doppler Indications:    I48.0 Paroxysmal  atrial fibrillation  History:        Patient has prior history of Echocardiogram examinations, most                 recent 03/12/2023. CHF, CAD and Previous Myocardial Infarction,                 Pacemaker and Abnormal ECG, COPD, Arrythmias:Atrial                 Fibrillation; Risk Factors:Former Smoker, Hypertension and                 Dyslipidemia.  Sonographer:    Dominica Severin RCS, RVS Referring Phys: [id number] SAMUEL G MCDOWELL  Sonographer Comments: Global longitudinal strain was attempted. IMPRESSIONS  1. Left ventricular ejection fraction, by estimation, is 60 to 65%. The left ventricle has normal function. The left ventricle has no regional wall motion abnormalities. There is mild concentric left ventricular hypertrophy. Left ventricular diastolic parameters are consistent with Grade II diastolic dysfunction (pseudonormalization).  2. Right ventricular systolic function is normal. The right ventricular size is normal. There is mildly elevated pulmonary artery systolic pressure. The estimated right ventricular systolic pressure is 36.6 mmHg.  3. Left atrial size was severely dilated.  4. The mitral valve is degenerative. Mild to moderate mitral valve regurgitation. No evidence of mitral stenosis. The mean mitral valve gradient is 2.0 mmHg. Moderate mitral annular calcification.  5. The aortic valve is tricuspid. Aortic valve regurgitation is mild. Aortic valve sclerosis/calcification is present, without any evidence of aortic stenosis. Aortic valve mean gradient measures 5.0 mmHg.  6. The inferior vena cava is normal in size with greater than 50% respiratory variability, suggesting right atrial pressure of 3 mmHg. Comparison(s): Prior images reviewed side by side. LVEF normal range at 60-65%. Mildly elevated RVSP. Mild to moderate mitral regurgitation. FINDINGS  Left Ventricle: Left ventricular ejection fraction, by estimation, is 60 to 65%. The left ventricle has normal function. The left ventricle has no regional  wall motion abnormalities. Global longitudinal strain performed but not reported based on interpreter judgement due to suboptimal tracking. The left ventricular internal cavity size was normal in size. There is mild concentric left ventricular hypertrophy. Left ventricular diastolic parameters are consistent with Grade II diastolic dysfunction (pseudonormalization). Right Ventricle: The right ventricular size is normal. No increase in right ventricular wall thickness. Right ventricular systolic function is normal. There is mildly elevated pulmonary artery systolic pressure. The tricuspid regurgitant velocity is 2.90  m/s, and with an assumed right atrial pressure of 3 mmHg, the  estimated right ventricular systolic pressure is 36.6 mmHg. Left Atrium: Left atrial size was severely dilated. Right Atrium: Right atrial size was normal in size. Pericardium: There is no evidence of pericardial effusion. Mitral Valve: The mitral valve is degenerative in appearance. There is mild thickening of the mitral valve leaflet(s). Moderate mitral annular calcification. Mild to moderate mitral valve regurgitation. No evidence of mitral valve stenosis. MV peak gradient, 4.1 mmHg. The mean mitral valve gradient is 2.0 mmHg. Tricuspid Valve: The tricuspid valve is grossly normal. Tricuspid valve regurgitation is mild. Aortic Valve: The aortic valve is tricuspid. There is mild aortic valve annular calcification. Aortic valve regurgitation is mild. Aortic regurgitation PHT measures 473 msec. Aortic valve sclerosis/calcification is present, without any evidence of aortic  stenosis. Aortic valve mean gradient measures 5.0 mmHg. Aortic valve peak gradient measures 9.1 mmHg. Aortic valve area, by VTI measures 2.96 cm. Pulmonic Valve: The pulmonic valve was grossly normal. Pulmonic valve regurgitation is trivial. Aorta: The aortic root and ascending aorta are structurally normal, with no evidence of dilitation. Venous: The inferior vena cava  is normal in size with greater than 50% respiratory variability, suggesting right atrial pressure of 3 mmHg. IAS/Shunts: No atrial level shunt detected by color flow Doppler. Additional Comments: A device lead is visualized.  LEFT VENTRICLE PLAX 2D LVIDd:         4.70 cm     Diastology LVIDs:         3.30 cm     LV e' medial:    4.90 cm/s LV PW:         1.20 cm     LV E/e' medial:  18.4 LV IVS:        1.20 cm     LV e' lateral:   8.27 cm/s LVOT diam:     2.20 cm     LV E/e' lateral: 10.9 LV SV:         91 LV SV Index:   56 LVOT Area:     3.80 cm                             3D Volume EF: LV Volumes (MOD)           3D EF:        65 % LV vol d, MOD A2C: 70.9 ml LV EDV:       100 ml LV vol d, MOD A4C: 66.6 ml LV ESV:       35 ml LV vol s, MOD A2C: 19.0 ml LV SV:        64 ml LV vol s, MOD A4C: 25.6 ml LV SV MOD A2C:     51.9 ml LV SV MOD A4C:     66.6 ml LV SV MOD BP:      47.9 ml RIGHT VENTRICLE RV Basal diam:  4.90 cm RV Mid diam:    3.20 cm RV S prime:     10.20 cm/s TAPSE (M-mode): 2.1 cm LEFT ATRIUM              Index        RIGHT ATRIUM           Index LA diam:        3.70 cm  2.29 cm/m   RA Area:     17.20 cm LA Vol (A2C):   96.1 ml  59.39 ml/m  RA Volume:   47.20 ml  29.17 ml/m LA Vol (A4C):   94.7 ml  58.53 ml/m LA Biplane Vol: 104.0 ml 64.27 ml/m  AORTIC VALVE                    PULMONIC VALVE AV Area (Vmax):    2.46 cm     PV Vmax:          0.89 m/s AV Area (Vmean):   3.08 cm     PV Peak grad:     3.2 mmHg AV Area (VTI):     2.96 cm     PR End Diast Vel: 5.95 msec AV Vmax:           151.00 cm/s AV Vmean:          99.700 cm/s AV VTI:            0.307 m AV Peak Grad:      9.1 mmHg AV Mean Grad:      5.0 mmHg LVOT Vmax:         97.90 cm/s LVOT Vmean:        80.900 cm/s LVOT VTI:          0.239 m LVOT/AV VTI ratio: 0.78 AI PHT:            473 msec  AORTA Ao Root diam: 3.50 cm Ao Asc diam:  3.20 cm MITRAL VALVE               TRICUSPID VALVE MV Area (PHT): 2.90 cm    TR Peak grad:   33.6 mmHg MV Area  VTI:   2.84 cm    TR Vmax:        290.00 cm/s MV Peak grad:  4.1 mmHg MV Mean grad:  2.0 mmHg    SHUNTS MV Vmax:       1.01 m/s    Systemic VTI:  0.24 m MV Vmean:      57.1 cm/s   Systemic Diam: 2.20 cm MV Decel Time: 262 msec MR Peak grad: 108.6 mmHg MR Mean grad: 73.0 mmHg MR Vmax:      521.00 cm/s MR Vmean:     402.0 cm/s MV E velocity: 90.30 cm/s MV A velocity: 60.20 cm/s MV E/A ratio:  1.50 Nona Dell MD Electronically signed by Nona Dell MD Signature Date/Time: 06/09/2023/11:35:16 AM    Final     Micro Results  Recent Results (from the past 240 hour(s))  Resp panel by RT-PCR (RSV, Flu A&B, Covid) Anterior Nasal Swab     Status: Abnormal   Collection Time: 07/05/23  8:46 AM   Specimen: Anterior Nasal Swab  Result Value Ref Range Status   SARS Coronavirus 2 by RT PCR POSITIVE (A) NEGATIVE Final    Comment: (NOTE) SARS-CoV-2 target nucleic acids are DETECTED.  The SARS-CoV-2 RNA is generally detectable in upper respiratory specimens during the acute phase of infection. Positive results are indicative of the presence of the identified virus, but do not rule out bacterial infection or co-infection with other pathogens not detected by the test. Clinical correlation with patient history and other diagnostic information is necessary to determine patient infection status. The expected result is Negative.  Fact Sheet for Patients: BloggerCourse.com  Fact Sheet for Healthcare Providers: SeriousBroker.it  This test is not yet approved or cleared by the [place] FDA and  has been authorized for detection and/or diagnosis of SARS-CoV-2 by FDA under an Emergency Use Authorization (EUA).  This EUA will remain in effect (meaning  this test can be used) for the duration of  the COVID-19 declaration under Section 564(b)(1) of the A ct, 21 U.S.C. section 360bbb-3(b)(1), unless the authorization is terminated or revoked sooner.      Influenza A by PCR NEGATIVE NEGATIVE Final   Influenza B by PCR NEGATIVE NEGATIVE Final    Comment: (NOTE) The Xpert Xpress SARS-CoV-2/FLU/RSV plus assay is intended as an aid in the diagnosis of influenza from Nasopharyngeal swab specimens and should not be used as a sole basis for treatment. Nasal washings and aspirates are unacceptable for Xpert Xpress SARS-CoV-2/FLU/RSV testing.  Fact Sheet for Patients: BloggerCourse.com  Fact Sheet for Healthcare Providers: SeriousBroker.it  This test is not yet approved or cleared by the [place] FDA and has been authorized for detection and/or diagnosis of SARS-CoV-2 by FDA under an Emergency Use Authorization (EUA). This EUA will remain in effect (meaning this test can be used) for the duration of the COVID-19 declaration under Section 564(b)(1) of the Act, 21 U.S.C. section 360bbb-3(b)(1), unless the authorization is terminated or revoked.     Resp Syncytial Virus by PCR NEGATIVE NEGATIVE Final    Comment: (NOTE) Fact Sheet for Patients: BloggerCourse.com  Fact Sheet for Healthcare Providers: SeriousBroker.it  This test is not yet approved or cleared by the [place] FDA and has been authorized for detection and/or diagnosis of SARS-CoV-2 by FDA under an Emergency Use Authorization (EUA). This EUA will remain in effect (meaning this test can be used) for the duration of the COVID-19 declaration under Section 564(b)(1) of the Act, 21 U.S.C. section 360bbb-3(b)(1), unless the authorization is terminated or revoked.  Performed at [facility], [address], [place], [place] [address]   MRSA Next Gen by PCR, Nasal     Status: Abnormal   Collection Time: 07/05/23 12:00 PM   Specimen: Nasal Mucosa; Nasal Swab  Result Value Ref Range Status   MRSA by PCR Next Gen DETECTED (A) NOT DETECTED Final    Comment: RESULT CALLED TO,  READ BACK BY AND VERIFIED WITH: J RAFTER AT 1614 ON [id number] BY S DALTON (NOTE) The GeneXpert MRSA Assay (FDA approved for NASAL specimens only), is one component of a comprehensive MRSA colonization surveillance program. It is not intended to diagnose MRSA infection nor to guide or monitor treatment for MRSA infections. Test performance is not FDA approved in patients less than 31 years old. Performed at [facility], [address], [place], [place] [address]    Today   Subjective    [name] today has no new complaints -No fever  Or chills  Dyspnea has resolved- --hypoxia has resolved - patient is on room air and doing well   Patient has been seen and examined prior to discharge   Objective   Blood pressure 139/70, pulse 71, temperature 97.6 F (36.4 C), temperature source Oral, resp. rate 18, height 5\' 5"  (1.651 m), weight 58.3 kg, SpO2 97%.   Intake/Output Summary (Last 24 hours) at 07/07/2023 1112 Last data filed at 07/07/2023 0957 Gross per 24 hour  Intake 240 ml  Output 475 ml  Net -235 ml    Exam Gen:- Awake Alert, no acute distress  HEENT:- [place].AT, No sclera icterus Ears---HOH Neck-Supple Neck,No JVD,.  Lungs-improved air movement, no wheezing no rales  CV- S1, S2 normal, irregular Abd-  +ve B.Sounds, Abd Soft, No tenderness,    Extremity/Skin:- No  edema,   good pulses Psych-affect is appropriate, oriented x3 Neuro-no new focal deficits, no tremors  Data Review   CBC w Diff:  Lab Results  Component Value Date   WBC 10.5 07/06/2023   HGB 10.4 (L) 07/06/2023   HCT 32.4 (L) 07/06/2023   PLT 274 07/06/2023   LYMPHOPCT 13 07/05/2023   MONOPCT 8 07/05/2023   EOSPCT 0 07/05/2023   BASOPCT 0 07/05/2023   CMP:  Lab Results  Component Value Date   NA 135 07/06/2023   K 3.8 07/06/2023   CL 101 07/06/2023   CO2 24 07/06/2023   BUN 20 07/06/2023   CREATININE 0.85 07/06/2023   CREATININE 0.62 04/08/2016   PROT 7.3 04/16/2023   ALBUMIN 3.3 (L)  04/16/2023   BILITOT 0.7 04/16/2023   ALKPHOS 71 04/16/2023   AST 19 04/16/2023   ALT 12 04/16/2023  . Total Discharge time is about 33 minutes  Shon Hale M.D on 07/07/2023 at 11:12 AM  Go to [email or web address] -  for contact info  [facility] - Office  [phone]

## 2023-07-07 NOTE — Progress Notes (Signed)
Patient discharged to [facility] facility,report called and given to Brynda Greathouse, LPN. IV discontinued,catheter intact. Transported by EMS to awaiting facility.

## 2023-07-07 NOTE — [place] FL2 (Signed)
[place] MEDICAID FL2 LEVEL OF CARE FORM     IDENTIFICATION  Patient Name: [name] Birthdate: Dec 25, 1935 Sex: female Admission Date (Current Location): 07/05/2023  [facility] and [place] Number:  [employer] and Address:  [facility],  [address] [place] [address]      Provider Number: [id number]  Attending Physician Name and Address:  Shon Hale, MD  Relative Name and Phone Number:       Current Level of Care: Hospital Recommended Level of Care: Nursing Facility Prior Approval Number:    Date Approved/Denied:   PASRR Number:    Discharge Plan: SNF    Current Diagnoses: Patient Active Problem List   Diagnosis Date Noted   COVID-19 07/05/2023   CHF (congestive heart failure) (HCC) 03/12/2023   NSTEMI (non-ST elevated myocardial infarction) (HCC) 03/11/2023   Dehydration 11/25/2022   Failure to thrive in adult 11/25/2022   Acute metabolic encephalopathy 11/06/2022   Frequent falls 11/06/2022   Right sided weakness 11/05/2022   Paranoid delusion (HCC) 10/28/2022   Current moderate episode of major depressive disorder (HCC) 10/28/2022   Community acquired pneumonia 10/27/2022   Hardening of the aorta (main artery of the heart) (HCC) 10/27/2022   Pruritic rash 10/27/2022   Generalized abdominal pain 09/29/2022   COPD (chronic obstructive pulmonary disease) (HCC) 06/29/2022   Chronic respiratory failure with hypoxia (HCC) 06/29/2022   Chronic systolic heart failure (HCC) 06/29/2022   IPMN (intraductal papillary mucinous neoplasm) 06/09/2022   Constipation 06/09/2022   Atherosclerosis of coronary artery without angina pectoris 04/23/2022   Abdominal pain, chronic, right upper quadrant 12/03/2021   Hearing difficulty 06/19/2021   Neck pain 05/16/2021   CAD (coronary artery disease) 05/02/2021   Nausea without vomiting 04/17/2021   Productive cough 04/17/2021   Chest pain 03/30/2021   COVID-19 virus infection 03/30/2021    Non-STEMI (non-ST elevated myocardial infarction) (HCC)    Mass of pancreas 03/20/2021   Hypertensive urgency 03/19/2021   Elevated troponin 03/19/2021   Edema of lower extremity 01/14/2021   Impaired fasting glucose 01/07/2021   Generalized anxiety disorder 01/07/2021   Essential tremor 01/07/2021   Proteinuria 01/07/2021   Essential hypertension 01/02/2021   Iron deficiency anemia 01/02/2021   Prediabetes 01/02/2021   Difficulty swallowing 07/03/2020   Nonspecific chest pain 09/15/2016   Complete rotator cuff tear of left shoulder    Anemia 10/31/2014   H/O heart artery stent 10/30/2014   Chest pain with moderate risk for cardiac etiology 08/23/2014   Diastolic dysfunction with chronic heart failure (HCC) 08/23/2014   Chronic diastolic heart failure/GD11--EF 60 to 65 % 08/23/2014   Abdominal pain 08/23/2014   Acquired obstruction of both nasolacrimal ducts 06/15/2014   Lower abdominal pain 01/27/2012   Hypokalemia 06/03/2011   Disease of pericardium 07/10/2009   Disease of pericardium 07/10/2009   Essential hypertension, benign 04/18/2009   Triple vessel disease of the heart 04/18/2009   Benign essential hypertension 04/18/2009   PPM-[employer]tr[employer] 04/04/2009   Sinus node dysfunction (HCC) 01/26/2009   HLD (hyperlipidemia) 12/19/2008   Anxiety 12/19/2008   Persistent atrial fibrillation (HCC) 12/19/2008   GERD (gastroesophageal reflux disease) 12/19/2008   Abnormal involuntary movements 12/19/2008   Atrial fibrillation, chronic (HCC) 12/19/2008   Mixed hyperlipidemia 12/19/2008    Orientation RESPIRATION BLADDER Height & Weight     Self, Place, Situation  Normal Continent Weight: 128 lb 8.5 oz (58.3 kg) Height:  5\' 5"  (165.1 cm)  BEHAVIORAL SYMPTOMS/MOOD NEUROLOGICAL BOWEL NUTRITION STATUS  Continent Diet (Heart healthy)  AMBULATORY STATUS COMMUNICATION OF NEEDS Skin   Limited Assist Verbally Normal                       Personal Care Assistance Level of  Assistance  Bathing, Feeding, Dressing Bathing Assistance: Limited assistance Feeding assistance: Independent Dressing Assistance: Limited assistance     Functional Limitations Info  Sight, Hearing, Speech Sight Info: Adequate Hearing Info: Impaired Speech Info: Adequate    SPECIAL CARE FACTORS FREQUENCY                       Contractures Contractures Info: Not present    Additional Factors Info  Code Status, Allergies Code Status Info: DNR-Limited Allergies Info: Amitriptyline Hcl, Plavix (Clopidogrel Bisulfate), Sulfonamide Derivatives           Current Medications (07/07/2023):  This is the current hospital active medication list Current Facility-Administered Medications  Medication Dose Route Frequency Provider Last Rate Last Admin   acetaminophen (TYLENOL) tablet 650 mg  650 mg Oral Q6H PRN Mariea Clonts, Courage, MD   650 mg at 07/06/23 1610   Or   acetaminophen (TYLENOL) suppository 650 mg  650 mg Rectal Q6H PRN Emokpae, Courage, MD       albuterol (PROVENTIL) (2.5 MG/3ML) 0.083% nebulizer solution 2.5 mg  2.5 mg Nebulization Q2H PRN Emokpae, Courage, MD       amiodarone (PACERONE) tablet 200 mg  200 mg Oral Daily Emokpae, Courage, MD   200 mg at 07/07/23 1007   aspirin EC tablet 81 mg  81 mg Oral Q breakfast Emokpae, Courage, MD   81 mg at 07/07/23 0956   atorvastatin (LIPITOR) tablet 80 mg  80 mg Oral Daily Emokpae, Courage, MD   80 mg at 07/07/23 0955   bisacodyl (DULCOLAX) suppository 10 mg  10 mg Rectal Daily PRN Shon Hale, MD       Chlorhexidine Gluconate Cloth 2 % PADS 6 each  6 each Topical Q0600 Mariea Clonts, Courage, MD   6 each at 07/06/23 0528   cyanocobalamin (VITAMIN B12) tablet 5,000 mcg  5,000 mcg Oral Daily Paulita Fujita H, RPH   5,000 mcg at 07/07/23 9604   folic acid (FOLVITE) tablet 1 mg  1 mg Oral Daily Emokpae, Courage, MD   1 mg at 07/07/23 0955   furosemide (LASIX) injection 40 mg  40 mg Intravenous Daily Emokpae, Courage, MD   40 mg at  07/07/23 0957   guaiFENesin (MUCINEX) 12 hr tablet 600 mg  600 mg Oral BID Emokpae, Courage, MD   600 mg at 07/07/23 0955   heparin injection 5,000 Units  5,000 Units Subcutaneous Q8H Emokpae, Courage, MD   5,000 Units at 07/07/23 0455   ipratropium-albuterol (DUONEB) 0.5-2.5 (3) MG/3ML nebulizer solution 3 mL  3 mL Nebulization TID Mariea Clonts, Courage, MD   3 mL at 07/07/23 0812   isosorbide mononitrate (IMDUR) 24 hr tablet 30 mg  30 mg Oral Daily Emokpae, Courage, MD   30 mg at 07/07/23 0954   methylPREDNISolone sodium succinate (SOLU-MEDROL) 40 mg/mL injection 40 mg  40 mg Intravenous Q12H Emokpae, Courage, MD   40 mg at 07/07/23 0450   metoprolol succinate (TOPROL-XL) 24 hr tablet 50 mg  50 mg Oral BID Mariea Clonts, Courage, MD   50 mg at 07/07/23 0956   mupirocin ointment (BACTROBAN) 2 %   Nasal BID Shon Hale, MD   Given at 07/07/23 0954   nitroGLYCERIN 50 mg in dextrose 5 %  250 mL (0.2 mg/mL) infusion  5-200 mcg/min Intravenous Continuous Emokpae, Courage, MD   Stopped at 07/05/23 1255   ondansetron (ZOFRAN) tablet 4 mg  4 mg Oral Q6H PRN Emokpae, Courage, MD   4 mg at 07/06/23 1733   Or   ondansetron (ZOFRAN) injection 4 mg  4 mg Intravenous Q6H PRN Emokpae, Courage, MD       Oral care mouth rinse  15 mL Mouth Rinse PRN Emokpae, Courage, MD       pantoprazole (PROTONIX) EC tablet 40 mg  40 mg Oral Daily Emokpae, Courage, MD   40 mg at 07/07/23 0956   PARoxetine (PAXIL) tablet 10 mg  10 mg Oral Daily Emokpae, Courage, MD   10 mg at 07/07/23 0955   polyethylene glycol (MIRALAX / GLYCOLAX) packet 17 g  17 g Oral Daily Emokpae, Courage, MD   17 g at 07/07/23 0956   polyethylene glycol (MIRALAX / GLYCOLAX) packet 17 g  17 g Oral Daily PRN Emokpae, Courage, MD   17 g at 07/05/23 1736   ranolazine (RANEXA) 12 hr tablet 500 mg  500 mg Oral BID Emokpae, Courage, MD   500 mg at 07/07/23 0956   senna-docusate (Senokot-S) tablet 2 tablet  2 tablet Oral QHS Emokpae, Courage, MD   2 tablet at 07/06/23 2133    sodium chloride flush (NS) 0.9 % injection 3 mL  3 mL Intravenous Q12H Emokpae, Courage, MD   3 mL at 07/06/23 2133   sodium chloride flush (NS) 0.9 % injection 3 mL  3 mL Intravenous Q12H Emokpae, Courage, MD   3 mL at 07/07/23 1006   sodium chloride flush (NS) 0.9 % injection 3 mL  3 mL Intravenous PRN Emokpae, Courage, MD       ticagrelor (BRILINTA) tablet 60 mg  60 mg Oral BID Mariea Clonts, Courage, MD   60 mg at 07/07/23 1007     Discharge Medications: Please see discharge summary for a list of discharge medications.  Relevant Imaging Results:  Relevant Lab Results:   Additional Information SSN: [id number] [place]

## 2023-07-07 NOTE — Consult Note (Signed)
[facility] Liaison Note  07/07/2023  [name] March 29, 1936 [id number]  Location: RN Hospital Liaison screened the patient remotely at [facility].  Insurance: [employer] Advantage   [name] is a 87 y.o. female who is a Primary Care Patient of [name], MD The patient was screened for readmission hospitalization with noted extreme risk score for unplanned readmission risk with 2 IP/1 ED in 6 months.  The patient was assessed for potential Care Management service needs for post hospital transition for care coordination. Review of patient's electronic medical record reveals patient was admitted COVID-19. Pt will return to her facility [facility]) and the facility will continue to address her ongoing needs.   [facility] Care Management/Population Health does not replace or interfere with any arrangements made by the Inpatient Transition of Care team.   For questions contact:   Elliot Cousin, RN, [facility] Liaison [place]   [facility], Population Health Office Hours MTWF  8:00 am-6:00 pm Direct Dial: [phone] mobile [phone] [Office toll free line] Office Hours are M-F 8:30 - 5 pm [email or web address]

## 2023-07-07 NOTE — TOC Transition Note (Signed)
Transition of Care [facility]) - CM/SW Discharge Note   Patient Details  Name: [name] MRN: [id number] Date of Birth: [birth year]  Transition of Care [facility]) CM/SW Contact:  Villa Herb, LCSWA Phone Number: 07/07/2023, 10:51 AM   Clinical Narrative:    CSW updated that pt is medically stable for D/C back to [facility] today. CSW spoke to [place] in admissions who confirms they are ready to accept pt. Once MD has completed D/C CSW will provide RN with room and report numbers. RN states EMS is needed. Med necessity to be printed to floor for RN. EMS to be called when pt is ready. Legal guardian called, secure VM left with update on D/C. TOC signing off.   Final next level of care: Long Term Nursing Home Barriers to Discharge: Barriers Resolved   Patient Goals and CMS Choice CMS Medicare.gov Compare Post Acute Care list provided to:: Legal Guardian Choice offered to / list presented to : [facility] POA / Guardian  Discharge Placement                  Patient to be transferred to facility by: EMS Name of family member notified: Legal guardian Patient and family notified of of transfer: 07/07/23  Discharge Plan and Services Additional resources added to the After Visit Summary for   In-house Referral: Clinical Social Work Discharge Planning Services: CM Consult Post Acute Care Choice: Nursing Home                               Social Determinants of Health (SDOH) Interventions SDOH Screenings   Food Insecurity: No Food Insecurity (07/05/2023)  Housing: Low Risk  (07/05/2023)  Transportation Needs: No Transportation Needs (07/05/2023)  Utilities: Not At Risk (07/05/2023)  Alcohol Screen: Low Risk  (11/05/2022)  Depression (PHQ2-9): Low Risk  (11/05/2022)  Financial Resource Strain: Low Risk  (11/05/2022)  Physical Activity: Inactive (11/05/2022)  Social Connections: Moderately Integrated (11/05/2022)  Stress: No Stress Concern Present (11/05/2022)  Tobacco Use: Medium Risk  (07/05/2023)     Readmission Risk Interventions    07/06/2023   11:03 AM 07/02/2022   12:34 PM  Readmission Risk Prevention Plan  Transportation Screening Complete Complete  PCP or Specialist Appt within 5-7 Days  Complete  Home Care Screening  Complete  Medication Review (RN CM)  Complete  HRI or Home Care Consult Complete   Social Work Consult for Recovery Care Planning/Counseling Complete   Palliative Care Screening Not Applicable   Medication Review [employer]) Complete

## 2023-07-07 NOTE — Care Management Important Message (Signed)
Important Message  Patient Details  Name: [name] MRN: [id number] Date of Birth: 06/06/1936   Important Message Given:  N/A - LOS <3 / Initial given by admissions     Corey Harold 07/07/2023, 10:27 AM

## 2023-07-07 NOTE — Discharge Instructions (Signed)
1)Very low-salt diet advised----less than 2 g of sodium per day 2)Weigh yourself daily, call if you gain more than 3 pounds in 1 day or more than 5 pounds in 1 week as your diuretic medications may need to be adjusted 3)change Lasix/furosemide to 40 mg every morning and 20 mg every evening

## 2023-07-08 DIAGNOSIS — U071 COVID-19: Secondary | ICD-10-CM | POA: Diagnosis not present

## 2023-07-08 DIAGNOSIS — I5022 Chronic systolic (congestive) heart failure: Secondary | ICD-10-CM | POA: Diagnosis not present

## 2023-07-08 DIAGNOSIS — J069 Acute upper respiratory infection, unspecified: Secondary | ICD-10-CM | POA: Diagnosis not present

## 2023-07-09 DIAGNOSIS — I10 Essential (primary) hypertension: Secondary | ICD-10-CM | POA: Diagnosis not present

## 2023-07-09 DIAGNOSIS — I5022 Chronic systolic (congestive) heart failure: Secondary | ICD-10-CM | POA: Diagnosis not present

## 2023-07-09 DIAGNOSIS — E785 Hyperlipidemia, unspecified: Secondary | ICD-10-CM | POA: Diagnosis not present

## 2023-07-09 DIAGNOSIS — I482 Chronic atrial fibrillation, unspecified: Secondary | ICD-10-CM | POA: Diagnosis not present

## 2023-07-09 DIAGNOSIS — I2510 Atherosclerotic heart disease of native coronary artery without angina pectoris: Secondary | ICD-10-CM | POA: Diagnosis not present

## 2023-07-09 DIAGNOSIS — I5032 Chronic diastolic (congestive) heart failure: Secondary | ICD-10-CM | POA: Diagnosis not present

## 2023-07-09 DIAGNOSIS — U071 COVID-19: Secondary | ICD-10-CM | POA: Diagnosis not present

## 2023-07-10 DIAGNOSIS — M6281 Muscle weakness (generalized): Secondary | ICD-10-CM | POA: Diagnosis not present

## 2023-07-10 DIAGNOSIS — Z13220 Encounter for screening for lipoid disorders: Secondary | ICD-10-CM | POA: Diagnosis not present

## 2023-07-10 DIAGNOSIS — I5022 Chronic systolic (congestive) heart failure: Secondary | ICD-10-CM | POA: Diagnosis not present

## 2023-07-10 DIAGNOSIS — I10 Essential (primary) hypertension: Secondary | ICD-10-CM | POA: Diagnosis not present

## 2023-07-10 DIAGNOSIS — I214 Non-ST elevation (NSTEMI) myocardial infarction: Secondary | ICD-10-CM | POA: Diagnosis not present

## 2023-07-10 DIAGNOSIS — R946 Abnormal results of thyroid function studies: Secondary | ICD-10-CM | POA: Diagnosis not present

## 2023-07-10 DIAGNOSIS — G4700 Insomnia, unspecified: Secondary | ICD-10-CM | POA: Diagnosis not present

## 2023-07-10 DIAGNOSIS — J069 Acute upper respiratory infection, unspecified: Secondary | ICD-10-CM | POA: Diagnosis not present

## 2023-07-10 DIAGNOSIS — U071 COVID-19: Secondary | ICD-10-CM | POA: Diagnosis not present

## 2023-07-10 DIAGNOSIS — R1312 Dysphagia, oropharyngeal phase: Secondary | ICD-10-CM | POA: Diagnosis not present

## 2023-07-10 DIAGNOSIS — I509 Heart failure, unspecified: Secondary | ICD-10-CM | POA: Diagnosis not present

## 2023-07-13 DIAGNOSIS — I214 Non-ST elevation (NSTEMI) myocardial infarction: Secondary | ICD-10-CM | POA: Diagnosis not present

## 2023-07-13 DIAGNOSIS — R1312 Dysphagia, oropharyngeal phase: Secondary | ICD-10-CM | POA: Diagnosis not present

## 2023-07-13 DIAGNOSIS — I10 Essential (primary) hypertension: Secondary | ICD-10-CM | POA: Diagnosis not present

## 2023-07-13 DIAGNOSIS — I509 Heart failure, unspecified: Secondary | ICD-10-CM | POA: Diagnosis not present

## 2023-07-13 DIAGNOSIS — M6281 Muscle weakness (generalized): Secondary | ICD-10-CM | POA: Diagnosis not present

## 2023-07-14 DIAGNOSIS — R1312 Dysphagia, oropharyngeal phase: Secondary | ICD-10-CM | POA: Diagnosis not present

## 2023-07-14 DIAGNOSIS — I10 Essential (primary) hypertension: Secondary | ICD-10-CM | POA: Diagnosis not present

## 2023-07-14 DIAGNOSIS — I214 Non-ST elevation (NSTEMI) myocardial infarction: Secondary | ICD-10-CM | POA: Diagnosis not present

## 2023-07-14 DIAGNOSIS — M6281 Muscle weakness (generalized): Secondary | ICD-10-CM | POA: Diagnosis not present

## 2023-07-14 DIAGNOSIS — I509 Heart failure, unspecified: Secondary | ICD-10-CM | POA: Diagnosis not present

## 2023-07-15 DIAGNOSIS — I10 Essential (primary) hypertension: Secondary | ICD-10-CM | POA: Diagnosis not present

## 2023-07-15 DIAGNOSIS — J069 Acute upper respiratory infection, unspecified: Secondary | ICD-10-CM | POA: Diagnosis not present

## 2023-07-15 DIAGNOSIS — I214 Non-ST elevation (NSTEMI) myocardial infarction: Secondary | ICD-10-CM | POA: Diagnosis not present

## 2023-07-15 DIAGNOSIS — M6281 Muscle weakness (generalized): Secondary | ICD-10-CM | POA: Diagnosis not present

## 2023-07-15 DIAGNOSIS — I5022 Chronic systolic (congestive) heart failure: Secondary | ICD-10-CM | POA: Diagnosis not present

## 2023-07-15 DIAGNOSIS — I482 Chronic atrial fibrillation, unspecified: Secondary | ICD-10-CM | POA: Diagnosis not present

## 2023-07-15 DIAGNOSIS — I509 Heart failure, unspecified: Secondary | ICD-10-CM | POA: Diagnosis not present

## 2023-07-15 DIAGNOSIS — R1312 Dysphagia, oropharyngeal phase: Secondary | ICD-10-CM | POA: Diagnosis not present

## 2023-07-16 ENCOUNTER — Ambulatory Visit (INDEPENDENT_AMBULATORY_CARE_PROVIDER_SITE_OTHER): Payer: 59

## 2023-07-16 DIAGNOSIS — R1312 Dysphagia, oropharyngeal phase: Secondary | ICD-10-CM | POA: Diagnosis not present

## 2023-07-16 DIAGNOSIS — I214 Non-ST elevation (NSTEMI) myocardial infarction: Secondary | ICD-10-CM | POA: Diagnosis not present

## 2023-07-16 DIAGNOSIS — I495 Sick sinus syndrome: Secondary | ICD-10-CM

## 2023-07-16 DIAGNOSIS — I10 Essential (primary) hypertension: Secondary | ICD-10-CM | POA: Diagnosis not present

## 2023-07-16 DIAGNOSIS — M6281 Muscle weakness (generalized): Secondary | ICD-10-CM | POA: Diagnosis not present

## 2023-07-16 DIAGNOSIS — I509 Heart failure, unspecified: Secondary | ICD-10-CM | POA: Diagnosis not present

## 2023-07-16 LAB — CUP PACEART REMOTE DEVICE CHECK
Battery Remaining Longevity: 126 mo
Battery Voltage: 3.04 V
Brady Statistic AP VP Percent: 63.35 %
Brady Statistic AP VS Percent: 36.16 %
Brady Statistic AS VP Percent: 0 %
Brady Statistic AS VS Percent: 0.49 %
Brady Statistic RA Percent Paced: 99.48 %
Brady Statistic RV Percent Paced: 63.35 %
Date Time Interrogation Session: 20241212015933
Implantable Lead Connection Status: 753985
Implantable Lead Connection Status: 753985
Implantable Lead Implant Date: 20100526
Implantable Lead Implant Date: 20100526
Implantable Lead Location: 753859
Implantable Lead Location: 753860
Implantable Lead Model: 5076
Implantable Lead Model: 5076
Implantable Pulse Generator Implant Date: 20231213
Lead Channel Impedance Value: 323 Ohm
Lead Channel Impedance Value: 323 Ohm
Lead Channel Impedance Value: 361 Ohm
Lead Channel Impedance Value: 380 Ohm
Lead Channel Pacing Threshold Amplitude: 0.75 V
Lead Channel Pacing Threshold Amplitude: 1.375 V
Lead Channel Pacing Threshold Pulse Width: 0.4 ms
Lead Channel Pacing Threshold Pulse Width: 0.4 ms
Lead Channel Sensing Intrinsic Amplitude: 1.75 mV
Lead Channel Sensing Intrinsic Amplitude: 1.75 mV
Lead Channel Sensing Intrinsic Amplitude: 6.75 mV
Lead Channel Sensing Intrinsic Amplitude: 6.75 mV
Lead Channel Setting Pacing Amplitude: 1.5 V
Lead Channel Setting Pacing Amplitude: 2.5 V
Lead Channel Setting Pacing Pulse Width: 0.6 ms
Lead Channel Setting Sensing Sensitivity: 1.2 mV
Zone Setting Status: 755011

## 2023-07-17 DIAGNOSIS — M6281 Muscle weakness (generalized): Secondary | ICD-10-CM | POA: Diagnosis not present

## 2023-07-17 DIAGNOSIS — I482 Chronic atrial fibrillation, unspecified: Secondary | ICD-10-CM | POA: Diagnosis not present

## 2023-07-17 DIAGNOSIS — R1312 Dysphagia, oropharyngeal phase: Secondary | ICD-10-CM | POA: Diagnosis not present

## 2023-07-17 DIAGNOSIS — I10 Essential (primary) hypertension: Secondary | ICD-10-CM | POA: Diagnosis not present

## 2023-07-17 DIAGNOSIS — I509 Heart failure, unspecified: Secondary | ICD-10-CM | POA: Diagnosis not present

## 2023-07-17 DIAGNOSIS — I5022 Chronic systolic (congestive) heart failure: Secondary | ICD-10-CM | POA: Diagnosis not present

## 2023-07-17 DIAGNOSIS — J069 Acute upper respiratory infection, unspecified: Secondary | ICD-10-CM | POA: Diagnosis not present

## 2023-07-17 DIAGNOSIS — I214 Non-ST elevation (NSTEMI) myocardial infarction: Secondary | ICD-10-CM | POA: Diagnosis not present

## 2023-07-20 DIAGNOSIS — I5022 Chronic systolic (congestive) heart failure: Secondary | ICD-10-CM | POA: Diagnosis not present

## 2023-07-20 DIAGNOSIS — I509 Heart failure, unspecified: Secondary | ICD-10-CM | POA: Diagnosis not present

## 2023-07-20 DIAGNOSIS — R1312 Dysphagia, oropharyngeal phase: Secondary | ICD-10-CM | POA: Diagnosis not present

## 2023-07-20 DIAGNOSIS — J069 Acute upper respiratory infection, unspecified: Secondary | ICD-10-CM | POA: Diagnosis not present

## 2023-07-20 DIAGNOSIS — M6281 Muscle weakness (generalized): Secondary | ICD-10-CM | POA: Diagnosis not present

## 2023-07-20 DIAGNOSIS — I214 Non-ST elevation (NSTEMI) myocardial infarction: Secondary | ICD-10-CM | POA: Diagnosis not present

## 2023-07-20 DIAGNOSIS — I482 Chronic atrial fibrillation, unspecified: Secondary | ICD-10-CM | POA: Diagnosis not present

## 2023-07-20 DIAGNOSIS — I10 Essential (primary) hypertension: Secondary | ICD-10-CM | POA: Diagnosis not present

## 2023-07-21 DIAGNOSIS — R1312 Dysphagia, oropharyngeal phase: Secondary | ICD-10-CM | POA: Diagnosis not present

## 2023-07-21 DIAGNOSIS — M6281 Muscle weakness (generalized): Secondary | ICD-10-CM | POA: Diagnosis not present

## 2023-07-21 DIAGNOSIS — I214 Non-ST elevation (NSTEMI) myocardial infarction: Secondary | ICD-10-CM | POA: Diagnosis not present

## 2023-07-21 DIAGNOSIS — I10 Essential (primary) hypertension: Secondary | ICD-10-CM | POA: Diagnosis not present

## 2023-07-21 DIAGNOSIS — I509 Heart failure, unspecified: Secondary | ICD-10-CM | POA: Diagnosis not present

## 2023-07-22 DIAGNOSIS — I5022 Chronic systolic (congestive) heart failure: Secondary | ICD-10-CM | POA: Diagnosis not present

## 2023-07-22 DIAGNOSIS — I509 Heart failure, unspecified: Secondary | ICD-10-CM | POA: Diagnosis not present

## 2023-07-22 DIAGNOSIS — J069 Acute upper respiratory infection, unspecified: Secondary | ICD-10-CM | POA: Diagnosis not present

## 2023-07-22 DIAGNOSIS — R1312 Dysphagia, oropharyngeal phase: Secondary | ICD-10-CM | POA: Diagnosis not present

## 2023-07-22 DIAGNOSIS — I10 Essential (primary) hypertension: Secondary | ICD-10-CM | POA: Diagnosis not present

## 2023-07-22 DIAGNOSIS — I214 Non-ST elevation (NSTEMI) myocardial infarction: Secondary | ICD-10-CM | POA: Diagnosis not present

## 2023-07-22 DIAGNOSIS — M6281 Muscle weakness (generalized): Secondary | ICD-10-CM | POA: Diagnosis not present

## 2023-07-22 DIAGNOSIS — I482 Chronic atrial fibrillation, unspecified: Secondary | ICD-10-CM | POA: Diagnosis not present

## 2023-07-23 DIAGNOSIS — I10 Essential (primary) hypertension: Secondary | ICD-10-CM | POA: Diagnosis not present

## 2023-07-23 DIAGNOSIS — R1312 Dysphagia, oropharyngeal phase: Secondary | ICD-10-CM | POA: Diagnosis not present

## 2023-07-23 DIAGNOSIS — I214 Non-ST elevation (NSTEMI) myocardial infarction: Secondary | ICD-10-CM | POA: Diagnosis not present

## 2023-07-23 DIAGNOSIS — I509 Heart failure, unspecified: Secondary | ICD-10-CM | POA: Diagnosis not present

## 2023-07-23 DIAGNOSIS — M6281 Muscle weakness (generalized): Secondary | ICD-10-CM | POA: Diagnosis not present

## 2023-07-24 DIAGNOSIS — I214 Non-ST elevation (NSTEMI) myocardial infarction: Secondary | ICD-10-CM | POA: Diagnosis not present

## 2023-07-24 DIAGNOSIS — I509 Heart failure, unspecified: Secondary | ICD-10-CM | POA: Diagnosis not present

## 2023-07-24 DIAGNOSIS — R1312 Dysphagia, oropharyngeal phase: Secondary | ICD-10-CM | POA: Diagnosis not present

## 2023-07-24 DIAGNOSIS — I10 Essential (primary) hypertension: Secondary | ICD-10-CM | POA: Diagnosis not present

## 2023-07-24 DIAGNOSIS — M6281 Muscle weakness (generalized): Secondary | ICD-10-CM | POA: Diagnosis not present

## 2023-07-27 DIAGNOSIS — R1312 Dysphagia, oropharyngeal phase: Secondary | ICD-10-CM | POA: Diagnosis not present

## 2023-07-27 DIAGNOSIS — I214 Non-ST elevation (NSTEMI) myocardial infarction: Secondary | ICD-10-CM | POA: Diagnosis not present

## 2023-07-27 DIAGNOSIS — I509 Heart failure, unspecified: Secondary | ICD-10-CM | POA: Diagnosis not present

## 2023-07-27 DIAGNOSIS — I10 Essential (primary) hypertension: Secondary | ICD-10-CM | POA: Diagnosis not present

## 2023-07-27 DIAGNOSIS — M6281 Muscle weakness (generalized): Secondary | ICD-10-CM | POA: Diagnosis not present

## 2023-07-28 DIAGNOSIS — I214 Non-ST elevation (NSTEMI) myocardial infarction: Secondary | ICD-10-CM | POA: Diagnosis not present

## 2023-07-28 DIAGNOSIS — R1312 Dysphagia, oropharyngeal phase: Secondary | ICD-10-CM | POA: Diagnosis not present

## 2023-07-28 DIAGNOSIS — I10 Essential (primary) hypertension: Secondary | ICD-10-CM | POA: Diagnosis not present

## 2023-07-28 DIAGNOSIS — I509 Heart failure, unspecified: Secondary | ICD-10-CM | POA: Diagnosis not present

## 2023-07-28 DIAGNOSIS — M6281 Muscle weakness (generalized): Secondary | ICD-10-CM | POA: Diagnosis not present

## 2023-07-30 DIAGNOSIS — I214 Non-ST elevation (NSTEMI) myocardial infarction: Secondary | ICD-10-CM | POA: Diagnosis not present

## 2023-07-30 DIAGNOSIS — I509 Heart failure, unspecified: Secondary | ICD-10-CM | POA: Diagnosis not present

## 2023-07-30 DIAGNOSIS — I10 Essential (primary) hypertension: Secondary | ICD-10-CM | POA: Diagnosis not present

## 2023-07-30 DIAGNOSIS — R1312 Dysphagia, oropharyngeal phase: Secondary | ICD-10-CM | POA: Diagnosis not present

## 2023-07-30 DIAGNOSIS — M6281 Muscle weakness (generalized): Secondary | ICD-10-CM | POA: Diagnosis not present

## 2023-07-31 DIAGNOSIS — M6281 Muscle weakness (generalized): Secondary | ICD-10-CM | POA: Diagnosis not present

## 2023-07-31 DIAGNOSIS — R1312 Dysphagia, oropharyngeal phase: Secondary | ICD-10-CM | POA: Diagnosis not present

## 2023-07-31 DIAGNOSIS — I509 Heart failure, unspecified: Secondary | ICD-10-CM | POA: Diagnosis not present

## 2023-07-31 DIAGNOSIS — I214 Non-ST elevation (NSTEMI) myocardial infarction: Secondary | ICD-10-CM | POA: Diagnosis not present

## 2023-07-31 DIAGNOSIS — I10 Essential (primary) hypertension: Secondary | ICD-10-CM | POA: Diagnosis not present

## 2023-08-01 DIAGNOSIS — I509 Heart failure, unspecified: Secondary | ICD-10-CM | POA: Diagnosis not present

## 2023-08-01 DIAGNOSIS — I214 Non-ST elevation (NSTEMI) myocardial infarction: Secondary | ICD-10-CM | POA: Diagnosis not present

## 2023-08-01 DIAGNOSIS — M6281 Muscle weakness (generalized): Secondary | ICD-10-CM | POA: Diagnosis not present

## 2023-08-01 DIAGNOSIS — I10 Essential (primary) hypertension: Secondary | ICD-10-CM | POA: Diagnosis not present

## 2023-08-01 DIAGNOSIS — R1312 Dysphagia, oropharyngeal phase: Secondary | ICD-10-CM | POA: Diagnosis not present

## 2023-08-02 DIAGNOSIS — M79671 Pain in right foot: Secondary | ICD-10-CM | POA: Diagnosis not present

## 2023-08-03 DIAGNOSIS — I482 Chronic atrial fibrillation, unspecified: Secondary | ICD-10-CM | POA: Diagnosis not present

## 2023-08-03 DIAGNOSIS — I5022 Chronic systolic (congestive) heart failure: Secondary | ICD-10-CM | POA: Diagnosis not present

## 2023-08-03 DIAGNOSIS — I509 Heart failure, unspecified: Secondary | ICD-10-CM | POA: Diagnosis not present

## 2023-08-03 DIAGNOSIS — J069 Acute upper respiratory infection, unspecified: Secondary | ICD-10-CM | POA: Diagnosis not present

## 2023-08-03 DIAGNOSIS — R1312 Dysphagia, oropharyngeal phase: Secondary | ICD-10-CM | POA: Diagnosis not present

## 2023-08-03 DIAGNOSIS — I214 Non-ST elevation (NSTEMI) myocardial infarction: Secondary | ICD-10-CM | POA: Diagnosis not present

## 2023-08-03 DIAGNOSIS — I10 Essential (primary) hypertension: Secondary | ICD-10-CM | POA: Diagnosis not present

## 2023-08-03 DIAGNOSIS — M6281 Muscle weakness (generalized): Secondary | ICD-10-CM | POA: Diagnosis not present

## 2023-08-04 DIAGNOSIS — I10 Essential (primary) hypertension: Secondary | ICD-10-CM | POA: Diagnosis not present

## 2023-08-04 DIAGNOSIS — R1312 Dysphagia, oropharyngeal phase: Secondary | ICD-10-CM | POA: Diagnosis not present

## 2023-08-04 DIAGNOSIS — M6281 Muscle weakness (generalized): Secondary | ICD-10-CM | POA: Diagnosis not present

## 2023-08-04 DIAGNOSIS — I214 Non-ST elevation (NSTEMI) myocardial infarction: Secondary | ICD-10-CM | POA: Diagnosis not present

## 2023-08-04 DIAGNOSIS — I509 Heart failure, unspecified: Secondary | ICD-10-CM | POA: Diagnosis not present

## 2023-08-05 DIAGNOSIS — M6281 Muscle weakness (generalized): Secondary | ICD-10-CM | POA: Diagnosis not present

## 2023-08-05 DIAGNOSIS — J069 Acute upper respiratory infection, unspecified: Secondary | ICD-10-CM | POA: Diagnosis not present

## 2023-08-05 DIAGNOSIS — I214 Non-ST elevation (NSTEMI) myocardial infarction: Secondary | ICD-10-CM | POA: Diagnosis not present

## 2023-08-05 DIAGNOSIS — I509 Heart failure, unspecified: Secondary | ICD-10-CM | POA: Diagnosis not present

## 2023-08-05 DIAGNOSIS — I5022 Chronic systolic (congestive) heart failure: Secondary | ICD-10-CM | POA: Diagnosis not present

## 2023-08-05 DIAGNOSIS — I10 Essential (primary) hypertension: Secondary | ICD-10-CM | POA: Diagnosis not present

## 2023-08-05 DIAGNOSIS — I482 Chronic atrial fibrillation, unspecified: Secondary | ICD-10-CM | POA: Diagnosis not present

## 2023-08-05 DIAGNOSIS — R1312 Dysphagia, oropharyngeal phase: Secondary | ICD-10-CM | POA: Diagnosis not present

## 2023-08-05 DIAGNOSIS — R112 Nausea with vomiting, unspecified: Secondary | ICD-10-CM | POA: Diagnosis not present

## 2023-08-06 ENCOUNTER — Encounter (HOSPITAL_COMMUNITY): Payer: Self-pay

## 2023-08-06 ENCOUNTER — Emergency Department (HOSPITAL_COMMUNITY)
Admission: EM | Admit: 2023-08-06 | Discharge: 2023-08-06 | Disposition: A | Discharge status: Home / Self Care | Payer: 59 | Attending: Emergency Medicine | Admitting: Emergency Medicine

## 2023-08-06 ENCOUNTER — Emergency Department (HOSPITAL_COMMUNITY): Payer: 59

## 2023-08-06 ENCOUNTER — Other Ambulatory Visit: Payer: Self-pay

## 2023-08-06 DIAGNOSIS — R5381 Other malaise: Secondary | ICD-10-CM | POA: Diagnosis not present

## 2023-08-06 DIAGNOSIS — I110 Hypertensive heart disease with heart failure: Secondary | ICD-10-CM | POA: Insufficient documentation

## 2023-08-06 DIAGNOSIS — Z7982 Long term (current) use of aspirin: Secondary | ICD-10-CM | POA: Insufficient documentation

## 2023-08-06 DIAGNOSIS — I482 Chronic atrial fibrillation, unspecified: Secondary | ICD-10-CM | POA: Diagnosis not present

## 2023-08-06 DIAGNOSIS — Z9049 Acquired absence of other specified parts of digestive tract: Secondary | ICD-10-CM | POA: Diagnosis not present

## 2023-08-06 DIAGNOSIS — Z8673 Personal history of transient ischemic attack (TIA), and cerebral infarction without residual deficits: Secondary | ICD-10-CM | POA: Diagnosis not present

## 2023-08-06 DIAGNOSIS — M7731 Calcaneal spur, right foot: Secondary | ICD-10-CM | POA: Diagnosis not present

## 2023-08-06 DIAGNOSIS — R935 Abnormal findings on diagnostic imaging of other abdominal regions, including retroperitoneum: Secondary | ICD-10-CM | POA: Diagnosis not present

## 2023-08-06 DIAGNOSIS — M19071 Primary osteoarthritis, right ankle and foot: Secondary | ICD-10-CM | POA: Insufficient documentation

## 2023-08-06 DIAGNOSIS — Z043 Encounter for examination and observation following other accident: Secondary | ICD-10-CM | POA: Diagnosis not present

## 2023-08-06 DIAGNOSIS — S199XXA Unspecified injury of neck, initial encounter: Secondary | ICD-10-CM | POA: Diagnosis not present

## 2023-08-06 DIAGNOSIS — R109 Unspecified abdominal pain: Secondary | ICD-10-CM | POA: Insufficient documentation

## 2023-08-06 DIAGNOSIS — M542 Cervicalgia: Secondary | ICD-10-CM | POA: Insufficient documentation

## 2023-08-06 DIAGNOSIS — W1839XA Other fall on same level, initial encounter: Secondary | ICD-10-CM | POA: Diagnosis not present

## 2023-08-06 DIAGNOSIS — I700 Atherosclerosis of aorta: Secondary | ICD-10-CM | POA: Diagnosis not present

## 2023-08-06 DIAGNOSIS — M79671 Pain in right foot: Secondary | ICD-10-CM | POA: Diagnosis not present

## 2023-08-06 DIAGNOSIS — I10 Essential (primary) hypertension: Secondary | ICD-10-CM | POA: Diagnosis not present

## 2023-08-06 DIAGNOSIS — M2011 Hallux valgus (acquired), right foot: Secondary | ICD-10-CM | POA: Diagnosis not present

## 2023-08-06 DIAGNOSIS — R410 Disorientation, unspecified: Secondary | ICD-10-CM | POA: Diagnosis not present

## 2023-08-06 DIAGNOSIS — J449 Chronic obstructive pulmonary disease, unspecified: Secondary | ICD-10-CM | POA: Insufficient documentation

## 2023-08-06 DIAGNOSIS — Z743 Need for continuous supervision: Secondary | ICD-10-CM | POA: Diagnosis not present

## 2023-08-06 DIAGNOSIS — I2510 Atherosclerotic heart disease of native coronary artery without angina pectoris: Secondary | ICD-10-CM | POA: Insufficient documentation

## 2023-08-06 DIAGNOSIS — I6529 Occlusion and stenosis of unspecified carotid artery: Secondary | ICD-10-CM | POA: Diagnosis not present

## 2023-08-06 DIAGNOSIS — R627 Adult failure to thrive: Secondary | ICD-10-CM | POA: Diagnosis not present

## 2023-08-06 DIAGNOSIS — M25551 Pain in right hip: Secondary | ICD-10-CM | POA: Insufficient documentation

## 2023-08-06 DIAGNOSIS — I5022 Chronic systolic (congestive) heart failure: Secondary | ICD-10-CM | POA: Diagnosis not present

## 2023-08-06 DIAGNOSIS — W19XXXA Unspecified fall, initial encounter: Secondary | ICD-10-CM

## 2023-08-06 DIAGNOSIS — R531 Weakness: Secondary | ICD-10-CM | POA: Diagnosis not present

## 2023-08-06 DIAGNOSIS — S0990XA Unspecified injury of head, initial encounter: Secondary | ICD-10-CM | POA: Diagnosis not present

## 2023-08-06 DIAGNOSIS — R404 Transient alteration of awareness: Secondary | ICD-10-CM | POA: Diagnosis not present

## 2023-08-06 DIAGNOSIS — I509 Heart failure, unspecified: Secondary | ICD-10-CM | POA: Insufficient documentation

## 2023-08-06 DIAGNOSIS — E041 Nontoxic single thyroid nodule: Secondary | ICD-10-CM | POA: Diagnosis not present

## 2023-08-06 LAB — CBC WITH DIFFERENTIAL/PLATELET
Abs Immature Granulocytes: 0.02 10*3/uL (ref 0.00–0.07)
Basophils Absolute: 0 10*3/uL (ref 0.0–0.1)
Basophils Relative: 0 %
Eosinophils Absolute: 0 10*3/uL (ref 0.0–0.5)
Eosinophils Relative: 1 %
HCT: 35.7 % — ABNORMAL LOW (ref 36.0–46.0)
Hemoglobin: 11.2 g/dL — ABNORMAL LOW (ref 12.0–15.0)
Immature Granulocytes: 0 %
Lymphocytes Relative: 24 %
Lymphs Abs: 1.2 10*3/uL (ref 0.7–4.0)
MCH: 29.8 pg (ref 26.0–34.0)
MCHC: 31.4 g/dL (ref 30.0–36.0)
MCV: 94.9 fL (ref 80.0–100.0)
Monocytes Absolute: 0.6 10*3/uL (ref 0.1–1.0)
Monocytes Relative: 12 %
Neutro Abs: 3.2 10*3/uL (ref 1.7–7.7)
Neutrophils Relative %: 63 %
Platelets: 301 10*3/uL (ref 150–400)
RBC: 3.76 MIL/uL — ABNORMAL LOW (ref 3.87–5.11)
RDW: 14.9 % (ref 11.5–15.5)
WBC: 5.1 10*3/uL (ref 4.0–10.5)
nRBC: 0 % (ref 0.0–0.2)

## 2023-08-06 LAB — URINALYSIS, ROUTINE W REFLEX MICROSCOPIC
Bilirubin Urine: NEGATIVE
Glucose, UA: NEGATIVE mg/dL
Hgb urine dipstick: NEGATIVE
Ketones, ur: NEGATIVE mg/dL
Leukocytes,Ua: NEGATIVE
Nitrite: NEGATIVE
Protein, ur: NEGATIVE mg/dL
Specific Gravity, Urine: 1.003 — ABNORMAL LOW (ref 1.005–1.030)
pH: 7 (ref 5.0–8.0)

## 2023-08-06 LAB — COMPREHENSIVE METABOLIC PANEL
ALT: 15 U/L (ref 0–44)
AST: 19 U/L (ref 15–41)
Albumin: 3.3 g/dL — ABNORMAL LOW (ref 3.5–5.0)
Alkaline Phosphatase: 92 U/L (ref 38–126)
Anion gap: 9 (ref 5–15)
BUN: 12 mg/dL (ref 8–23)
CO2: 32 mmol/L (ref 22–32)
Calcium: 9.5 mg/dL (ref 8.9–10.3)
Chloride: 100 mmol/L (ref 98–111)
Creatinine, Ser: 0.68 mg/dL (ref 0.44–1.00)
GFR, Estimated: >60 mL/min (ref >60)
Glucose, Bld: 87 mg/dL (ref 70–99)
Potassium: 3.6 mmol/L (ref 3.5–5.1)
Sodium: 141 mmol/L (ref 135–145)
Total Bilirubin: 0.8 mg/dL (ref 0.0–1.2)
Total Protein: 7.3 g/dL (ref 6.5–8.1)

## 2023-08-06 LAB — LIPASE, BLOOD: Lipase: 34 U/L (ref 11–51)

## 2023-08-06 LAB — CK: Total CK: 51 U/L (ref 38–234)

## 2023-08-06 LAB — MAGNESIUM: Magnesium: 2.1 mg/dL (ref 1.7–2.4)

## 2023-08-06 MED ORDER — ACETAMINOPHEN 325 MG PO TABS
650.0000 mg | ORAL_TABLET | Freq: Once | ORAL | Status: AC
  Administered 2023-08-06: 650 mg via ORAL
  Filled 2023-08-06: qty 2

## 2023-08-06 MED ORDER — ALPRAZOLAM 0.5 MG PO TABS
0.5000 mg | ORAL_TABLET | Freq: Once | ORAL | Status: AC
  Administered 2023-08-06: 0.5 mg via ORAL
  Filled 2023-08-06: qty 1

## 2023-08-06 NOTE — ED Provider Notes (Signed)
 [place] EMERGENCY DEPARTMENT AT [facility] Provider Note   CSN: [id number] Arrival date & time: 08/06/23  1216     History  Chief Complaint  Patient presents with   [place]    [name] is a 88 y.o. female.   Fall Pertinent negatives include no chest pain, no abdominal pain, no headaches and no shortness of breath.       [name] is a 88 y.o. female with past medical history of hypertension, CHF, anxiety, atrial fibrillation, anemia, COPD, [name] syndrome, brought in by EMS from [facility] for evaluation of fall.  Per EMS report, patient was found by facility staff on the floor this morning.  Unable to determine any injuries.  It was noted that she stopped her Xanax  1 week ago and has been described as being shaky with altered mental status since that time.  Patient complains of abdominal pain, right hip pain and right foot pain.  She denies any head injury or LOC.  States that she fell 2 days ago.  Patient unable to provide any other significant medical history.  I spoke with care staff at [facility] [place] who states that patient has been refusing to take her Xanax  and putting herself on the floor intermittently x 2 weeks.  No witnessed fall today or last evening.  Patient was found on the floor earlier today without apparent injuries.  Care provider at the facility does endorse fall last week with injury of her right foot.  Patient has not been wearing a brace to her foot.  When asked about emergent reason for emergency room visit today, I was informed that we sent her because family wanted her to come in and we did not know what else to do with her.  Home Medications Prior to Admission medications   Medication Sig Start Date End Date Taking? Authorizing Provider  acetaminophen  (TYLENOL ) 325 MG tablet Take 2 tablets (650 mg total) by mouth every 6 (six) hours as needed for mild pain (or Fever >/= 101). 11/25/22   Emokpae, Courage, MD  albuterol   (PROVENTIL ) (2.5 MG/3ML) 0.083% nebulizer solution Take 3 mLs (2.5 mg total) by nebulization every 4 (four) hours as needed for wheezing or shortness of breath. 07/07/23 07/06/24  Pearlean Manus, MD  albuterol  (VENTOLIN  HFA) 108 (90 Base) MCG/ACT inhaler Inhale 1 puff into the lungs every 4 (four) hours as needed for wheezing or shortness of breath. 07/07/23   Pearlean Manus, MD  ALPRAZolam  (XANAX ) 0.5 MG tablet Take 1 tablet (0.5 mg total) by mouth at bedtime as needed for anxiety or sleep. 07/07/23   Pearlean Manus, MD  amiodarone  (PACERONE ) 200 MG tablet Take 1 tablet (200 mg total) by mouth daily. 03/16/23   Amin, Ankit C, MD  aspirin  EC 81 MG tablet Take 1 tablet (81 mg total) by mouth daily with breakfast. Swallow whole. 07/02/22   Pearlean Manus, MD  atorvastatin  (LIPITOR ) 80 MG tablet Take 1 tablet (80 mg total) by mouth daily. 02/06/23   Debera Jayson MATSU, MD  bisacodyl  (DULCOLAX) 10 MG suppository Place 1 suppository (10 mg total) rectally daily as needed for moderate constipation. 07/07/23   Pearlean Manus, MD  Cyanocobalamin  (VITAMIN B-12) 5000 MCG SUBL Take 5,000 mcg by mouth daily.    [provider]  dicyclomine  (BENTYL ) 10 MG capsule Take 1 capsule (10 mg total) by mouth every 12 (twelve) hours as needed for spasms (abdominal pain). 12/08/22   Eartha Angelia Sieving, MD  empagliflozin  (  JARDIANCE ) 25 MG TABS tablet Take 25 mg by mouth daily.    [provider]  folic acid  (FOLVITE ) 1 MG tablet Take 1 tablet (1 mg total) by mouth daily. 11/12/22   Evonnie Lenis, MD  furosemide  (LASIX ) 20 MG tablet Take 1 tablet (20 mg total) by mouth every evening. 07/07/23 07/06/24  Pearlean Manus, MD  furosemide  (LASIX ) 40 MG tablet Take 1 tablet (40 mg total) by mouth daily. 07/07/23   Pearlean Manus, MD  guaifenesin  (ROBITUSSIN) 100 MG/5ML syrup Take 200 mg by mouth every 6 (six) hours as needed for cough.    [provider]  isosorbide  mononitrate (IMDUR ) 30 MG 24 hr  tablet Take 1 tablet (30 mg total) by mouth daily. 03/16/23   Amin, Ankit C, MD  melatonin 5 MG TABS Take 5 mg by mouth at bedtime.    [provider]  metoprolol  succinate (TOPROL -XL) 50 MG 24 hr tablet Take 1 tablet (50 mg total) by mouth 2 (two) times daily. Take with or immediately following a meal. 03/15/23   Amin, Ankit C, MD  nitroGLYCERIN  (NITROLINGUAL ) 0.4 MG/SPRAY spray Place 1 spray under the tongue every 5 (five) minutes x 3 doses as needed for chest pain. 07/07/23   [provider]  Nutritional Supplements (NUTRITIONAL SUPPLEMENT PO) Take 237 mLs by mouth in the morning, at noon, and at bedtime.    [provider]  ondansetron  (ZOFRAN ) 4 MG tablet Take 4 mg by mouth every 6 (six) hours as needed for vomiting or nausea.    [provider]  pantoprazole  (PROTONIX ) 20 MG tablet Take 1 tablet (20 mg total) by mouth daily. 04/20/23   Eartha Angelia Sieving, MD  PARoxetine  (PAXIL ) 10 MG tablet Take 10 mg by mouth daily. 06/26/23   [provider]  polyethylene glycol (MIRALAX  / GLYCOLAX ) 17 g packet Take 17 g by mouth daily.    [provider]  Potassium Chloride  ER 20 MEQ TBCR Take 1 tablet (20 mEq total) by mouth daily. 1 tab daily by mouth 02/06/23 11/03/23  Debera Jayson MATSU, MD  ranolazine  (RANEXA ) 500 MG 12 hr tablet Take 1 tablet (500 mg total) by mouth 2 (two) times daily. 07/02/22   Pearlean Manus, MD  rOPINIRole (REQUIP) 0.5 MG tablet Take 0.5 mg by mouth daily. 01/20/23   [provider]  senna-docusate (SENOKOT-S) 8.6-50 MG tablet Take 2 tablets by mouth at bedtime. 11/25/22 11/25/23  Pearlean Manus, MD  ticagrelor  (BRILINTA ) 60 MG TABS tablet Take 1 tablet (60 mg total) by mouth 2 (two) times daily. 07/07/23   Pearlean Manus, MD  vitamin E  180 MG (400 UNITS) capsule Take 400 Units by mouth daily.    [provider]      Allergies    Amitriptyline hcl, Plavix  [clopidogrel  bisulfate], and Sulfonamide derivatives     Review of Systems   Review of Systems  Constitutional:  Negative for chills and fever.  Eyes:  Negative for visual disturbance.  Respiratory:  Negative for shortness of breath.   Cardiovascular:  Negative for chest pain.  Gastrointestinal:  Negative for abdominal pain, diarrhea, nausea and vomiting.  Musculoskeletal:  Positive for arthralgias, back pain and neck pain.  Skin:  Negative for wound.  Neurological:  Negative for dizziness and headaches.    Physical Exam Updated Vital Signs BP 123/79   Pulse 78   Temp 97.9 F (36.6 C) (Oral)   Resp 18   Ht 5' 5 (1.651 m)   Wt 58.3 kg  SpO2 96%   BMI 21.39 kg/m  Physical Exam Vitals and nursing note reviewed.  Constitutional:      General: She is not in acute distress.    Appearance: Normal appearance. She is not toxic-appearing.  HENT:     Head: Atraumatic.     Mouth/Throat:     Mouth: Mucous membranes are moist.     Pharynx: Oropharynx is clear.  Eyes:     Pupils: Pupils are equal, round, and reactive to light.  Neck:     Trachea: Phonation normal.     Comments: ttp neck pain on range of motion.  Do not appreciate any midline tenderness or bony deformity on exam Cardiovascular:     Rate and Rhythm: Normal rate and regular rhythm.     Pulses: Normal pulses.  Pulmonary:     Effort: Pulmonary effort is normal. No respiratory distress.     Breath sounds: No wheezing.  Chest:     Chest wall: No tenderness.  Abdominal:     General: There is no distension.     Palpations: Abdomen is soft.     Tenderness: There is abdominal tenderness. There is no guarding.  Musculoskeletal:        General: No tenderness (Pain with range of motion right foot, mild edema noted.  Also has pain with range of motion of right hip.  Do not appreciate any leg shortening or external rotation on exam).  Skin:    General: Skin is warm.     Capillary Refill: Capillary refill takes less than 2 seconds.     Findings: No erythema or rash.   Neurological:     General: No focal deficit present.     Mental Status: She is alert.     Sensory: Sensation is intact. No sensory deficit.     Motor: No weakness.     Coordination: Coordination is intact.     Comments: Patient appears to be mentating well.  Oriented x 3.  Answers questions appropriately and moving all extremities     ED Results / Procedures / Treatments   Labs (all labs ordered are listed, but only abnormal results are displayed) Labs Reviewed  CBC WITH DIFFERENTIAL/PLATELET - Abnormal; Notable for the following components:      Result Value   RBC 3.76 (*)    Hemoglobin 11.2 (*)    HCT 35.7 (*)    All other components within normal limits  URINALYSIS, ROUTINE W REFLEX MICROSCOPIC - Abnormal; Notable for the following components:   Color, Urine STRAW (*)    Specific Gravity, Urine 1.003 (*)    All other components within normal limits  COMPREHENSIVE METABOLIC PANEL - Abnormal; Notable for the following components:   Albumin 3.3 (*)    All other components within normal limits  LIPASE, BLOOD  CK  MAGNESIUM     EKG EKG Interpretation Date/Time:  Thursday August 06 2023 13:29:55 EST Ventricular Rate:  69 PR Interval:  284 QRS Duration:  74 QT Interval:  456 QTC Calculation: 488 R Axis:   61  Text Interpretation: AV dual-paced rhythm with prolonged AV conduction Abnormal ECG When compared with ECG of 05-Jul-2023 05:06, Muscle tremor present Confirmed by Towana Sharper [phone]) on 08/06/2023 1:32:46 PM  Radiology DG Foot Complete Right Result Date: 08/06/2023 CLINICAL DATA:  Pain, injury EXAM: RIGHT FOOT COMPLETE - 3+ VIEW COMPARISON:  01/07/2010 FINDINGS: Frontal, oblique, and lateral views of the right foot are obtained. There are no acute displaced fractures. Mild hallux valgus deformity  again noted. Multifocal osteoarthritis stable throughout the distal interphalangeal joints as well as the first and second metatarsophalangeal joints. Small inferior  calcaneal spur. Diffuse soft tissue swelling, greatest in the forefoot. IMPRESSION: 1. No acute displaced fracture. 2. Soft tissue swelling throughout the forefoot. 3. Multifocal osteoarthritis. Electronically Signed   By: Ozell Daring M.D.   On: 08/06/2023 16:29   CT CHEST ABDOMEN PELVIS WO CONTRAST Result Date: 08/06/2023 CLINICAL DATA:  Unwitnessed fall. EXAM: CT CHEST, ABDOMEN AND PELVIS WITHOUT CONTRAST TECHNIQUE: Multidetector CT imaging of the chest, abdomen and pelvis was performed following the standard protocol without IV contrast. RADIATION DOSE REDUCTION: This exam was performed according to the departmental dose-optimization program which includes automated exposure control, adjustment of the mA and/or kV according to patient size and/or use of iterative reconstruction technique. COMPARISON:  March 11, 2023.  November 01, 2022. FINDINGS: CT CHEST FINDINGS Cardiovascular: Atherosclerosis of thoracic aorta without aneurysm formation. Normal cardiac size. No pericardial effusion. Coronary artery calcifications are noted. Mediastinum/Nodes: 1.8 cm right thyroid  nodule. No adenopathy. Esophagus is unremarkable. Lungs/Pleura: No pneumothorax or pleural effusion is noted. Mild biapical scarring is noted. Mild emphysematous disease is noted. Minimal subsegmental atelectasis is noted laterally in right lung base. Musculoskeletal: No chest wall mass or suspicious bone lesions identified. CT ABDOMEN PELVIS FINDINGS Hepatobiliary: No focal liver abnormality is seen. Status post cholecystectomy. No biliary dilatation. Pancreas: Unremarkable. No pancreatic ductal dilatation or surrounding inflammatory changes. Spleen: Calcified splenic granulomas are noted. Adrenals/Urinary Tract: Adrenal glands are unremarkable. Kidneys are normal, without renal calculi, focal lesion, or hydronephrosis. Bladder is unremarkable. Stomach/Bowel: Stomach is unremarkable. There is no evidence of bowel obstruction or inflammation. Status  post appendectomy. Vascular/Lymphatic: Aortic atherosclerosis. No enlarged abdominal or pelvic lymph nodes. Reproductive: Status post hysterectomy. No adnexal masses. Other: No ascites or hernia. Musculoskeletal: No acute or significant osseous findings. IMPRESSION: No traumatic abnormality seen in the chest, abdomen or pelvis. 1.8 cm right thyroid  nodule. Recommend thyroid  US . (Ref: J Am Coll Radiol. 2015 Feb;12(2): 143-50). Coronary artery calcifications are noted suggesting coronary artery disease. Aortic Atherosclerosis (ICD10-I70.0). Electronically Signed   By: Lynwood Landy Raddle M.D.   On: 08/06/2023 16:18   CT Head Wo Contrast Result Date: 08/06/2023 CLINICAL DATA:  Head trauma, minor (Age >= 65y); Neck trauma (Age >= 65y). Found down. EXAM: CT HEAD WITHOUT CONTRAST CT CERVICAL SPINE WITHOUT CONTRAST TECHNIQUE: Multidetector CT imaging of the head and cervical spine was performed following the standard protocol without intravenous contrast. Multiplanar CT image reconstructions of the cervical spine were also generated. RADIATION DOSE REDUCTION: This exam was performed according to the departmental dose-optimization program which includes automated exposure control, adjustment of the mA and/or kV according to patient size and/or use of iterative reconstruction technique. COMPARISON:  Head CT 11/24/2022.  CT cervical spine 11/05/2022. FINDINGS: CT HEAD FINDINGS Brain: No acute hemorrhage. 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 No new loss of gray-white differentiation. No hydrocephalus or extra-axial collection. No mass effect or midline shift. Vascular: No hyperdense vessel or unexpected calcification. Skull: No calvarial fracture or suspicious bone lesion. Skull base is unremarkable. Sinuses/Orbits: No acute finding. Other: None. CT CERVICAL SPINE FINDINGS Alignment: Normal. Skull base and vertebrae: No acute fracture. Normal craniocervical junction. No suspicious bone lesions. Soft tissues and  spinal canal: No prevertebral fluid or swelling. No visible canal hematoma. Disc levels: Unchanged mild cervical spondylosis without high-grade spinal canal stenosis. Upper chest: No acute findings. Other: Atherosclerotic calcifications of the carotid bulbs. IMPRESSION: 1. No  acute intracranial abnormality. 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 2. No acute cervical spine fracture or traumatic listhesis. Electronically Signed   By: Ryan Chess M.D.   On: 08/06/2023 15:48   CT Cervical Spine Wo Contrast Result Date: 08/06/2023 CLINICAL DATA:  Head trauma, minor (Age >= 65y); Neck trauma (Age >= 65y). Found down. EXAM: CT HEAD WITHOUT CONTRAST CT CERVICAL SPINE WITHOUT CONTRAST TECHNIQUE: Multidetector CT imaging of the head and cervical spine was performed following the standard protocol without intravenous contrast. Multiplanar CT image reconstructions of the cervical spine were also generated. RADIATION DOSE REDUCTION: This exam was performed according to the departmental dose-optimization program which includes automated exposure control, adjustment of the mA and/or kV according to patient size and/or use of iterative reconstruction technique. COMPARISON:  Head CT 11/24/2022.  CT cervical spine 11/05/2022. FINDINGS: CT HEAD FINDINGS Brain: No acute hemorrhage. 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 No new loss of gray-white differentiation. No hydrocephalus or extra-axial collection. No mass effect or midline shift. Vascular: No hyperdense vessel or unexpected calcification. Skull: No calvarial fracture or suspicious bone lesion. Skull base is unremarkable. Sinuses/Orbits: No acute finding. Other: None. CT CERVICAL SPINE FINDINGS Alignment: Normal. Skull base and vertebrae: No acute fracture. Normal craniocervical junction. No suspicious bone lesions. Soft tissues and spinal canal: No prevertebral fluid or swelling. No visible canal hematoma. Disc levels: Unchanged mild cervical  spondylosis without high-grade spinal canal stenosis. Upper chest: No acute findings. Other: Atherosclerotic calcifications of the carotid bulbs. IMPRESSION: 1. No acute intracranial abnormality. 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 2. No acute cervical spine fracture or traumatic listhesis. Electronically Signed   By: Ryan Chess M.D.   On: 08/06/2023 15:48    Procedures Procedures    Medications Ordered in ED Medications  acetaminophen  (TYLENOL ) tablet 650 mg (has no administration in time range)    ED Course/ Medical Decision Making/ A&P Clinical Course as of 08/06/23 1652  Thu Aug 06, 2023  3054 88 year old female brought in by ambulance for evaluation of possible injuries after a fall.  Patient is unable to give any relevant history.  Getting imaging and labs.  Disposition per results of testing. [MB]    Clinical Course User Index [MB] Towana Ozell BROCKS, MD                                 Medical Decision Making Patient sent here for evaluation after patient was found on the floor this morning.  No obvious injuries reported or witnessed.  Care provider at the facility states that she has been refusing to take her Xanax  for 2 weeks and putting herself on the floor at various times.  Family member requested patient be sent to emergency room for evaluation.  On my evaluation, patient lucid answering questions appropriately.    Amount and/or Complexity of Data Reviewed Labs: ordered.    Details: Labs unremarkable Radiology: ordered.    Details: CT head, C spine .no acute findings CT chest, abd pelvis w/o acute findings Xr right foot w/o evidence of fx Discussion of management or test interpretation with external provider(s): Workup reassuring.  Doubt emergent process.  Patient resting comfortably and was given her Xanax  at her request.  Spoke with patient's daughter, Rock, updated on findings of her workup.  Also notified her of thyroid  nodule seen on her CT.   Suspect this is incidental finding, but advised daughter  that she can follow-up with PCP regarding the nodule.  Daughter verbalized understanding agrees to plan.    Risk OTC drugs. Prescription drug management.           Final Clinical Impression(s) / ED Diagnoses Final diagnoses:  Fall, initial encounter    Rx / DC Orders ED Discharge Orders     None         Herlinda Madelin RIGGERS 08/07/23 2120    Towana Ozell BROCKS, MD 08/08/23 [phone]

## 2023-08-06 NOTE — Discharge Instructions (Signed)
 Her workup this evening was reassuring.  No evidence of any broken bones or dislocations.  Blood work and urinalysis were also reassuring.  She does have a small nodule on her thyroid , this is likely an incidental finding.  As discussed, I recommend that you follow-up with her primary care regarding this as she may need an ultrasound for further evaluation.  Please call her primary care provider to arrange follow-up appointment.

## 2023-08-06 NOTE — ED Notes (Signed)
 Attempted to call report to facility but there was no answer.

## 2023-08-06 NOTE — ED Triage Notes (Signed)
 Facility reports pt found onfloor and unable to determine any injuries.  Pt stopped taking her xanax 1 week ago and has been shakey with AMS since.

## 2023-08-07 DIAGNOSIS — J069 Acute upper respiratory infection, unspecified: Secondary | ICD-10-CM | POA: Diagnosis not present

## 2023-08-07 DIAGNOSIS — M6281 Muscle weakness (generalized): Secondary | ICD-10-CM | POA: Diagnosis not present

## 2023-08-07 DIAGNOSIS — R1312 Dysphagia, oropharyngeal phase: Secondary | ICD-10-CM | POA: Diagnosis not present

## 2023-08-07 DIAGNOSIS — I5022 Chronic systolic (congestive) heart failure: Secondary | ICD-10-CM | POA: Diagnosis not present

## 2023-08-07 DIAGNOSIS — I509 Heart failure, unspecified: Secondary | ICD-10-CM | POA: Diagnosis not present

## 2023-08-07 DIAGNOSIS — I214 Non-ST elevation (NSTEMI) myocardial infarction: Secondary | ICD-10-CM | POA: Diagnosis not present

## 2023-08-07 DIAGNOSIS — I10 Essential (primary) hypertension: Secondary | ICD-10-CM | POA: Diagnosis not present

## 2023-08-07 DIAGNOSIS — I482 Chronic atrial fibrillation, unspecified: Secondary | ICD-10-CM | POA: Diagnosis not present

## 2023-08-08 DIAGNOSIS — R509 Fever, unspecified: Secondary | ICD-10-CM | POA: Diagnosis not present

## 2023-08-08 DIAGNOSIS — R946 Abnormal results of thyroid function studies: Secondary | ICD-10-CM | POA: Diagnosis not present

## 2023-08-10 ENCOUNTER — Emergency Department (HOSPITAL_COMMUNITY): Payer: 59

## 2023-08-10 ENCOUNTER — Other Ambulatory Visit: Payer: Self-pay

## 2023-08-10 ENCOUNTER — Emergency Department (HOSPITAL_COMMUNITY)
Admission: EM | Admit: 2023-08-10 | Discharge: 2023-08-11 | Disposition: A | Discharge status: Skilled Nursing Facility | Payer: 59 | Attending: Student | Admitting: Student

## 2023-08-10 DIAGNOSIS — S3993XA Unspecified injury of pelvis, initial encounter: Secondary | ICD-10-CM | POA: Diagnosis not present

## 2023-08-10 DIAGNOSIS — R296 Repeated falls: Secondary | ICD-10-CM | POA: Insufficient documentation

## 2023-08-10 DIAGNOSIS — E876 Hypokalemia: Secondary | ICD-10-CM | POA: Diagnosis not present

## 2023-08-10 DIAGNOSIS — I5022 Chronic systolic (congestive) heart failure: Secondary | ICD-10-CM | POA: Insufficient documentation

## 2023-08-10 DIAGNOSIS — J449 Chronic obstructive pulmonary disease, unspecified: Secondary | ICD-10-CM | POA: Diagnosis not present

## 2023-08-10 DIAGNOSIS — I4891 Unspecified atrial fibrillation: Secondary | ICD-10-CM | POA: Insufficient documentation

## 2023-08-10 DIAGNOSIS — I2510 Atherosclerotic heart disease of native coronary artery without angina pectoris: Secondary | ICD-10-CM | POA: Insufficient documentation

## 2023-08-10 DIAGNOSIS — F03918 Unspecified dementia, unspecified severity, with other behavioral disturbance: Secondary | ICD-10-CM

## 2023-08-10 DIAGNOSIS — M7989 Other specified soft tissue disorders: Secondary | ICD-10-CM | POA: Diagnosis not present

## 2023-08-10 DIAGNOSIS — I10 Essential (primary) hypertension: Secondary | ICD-10-CM | POA: Insufficient documentation

## 2023-08-10 DIAGNOSIS — F0390 Unspecified dementia without behavioral disturbance: Secondary | ICD-10-CM | POA: Insufficient documentation

## 2023-08-10 DIAGNOSIS — N390 Urinary tract infection, site not specified: Secondary | ICD-10-CM | POA: Diagnosis not present

## 2023-08-10 DIAGNOSIS — M8580 Other specified disorders of bone density and structure, unspecified site: Secondary | ICD-10-CM | POA: Diagnosis not present

## 2023-08-10 DIAGNOSIS — R410 Disorientation, unspecified: Secondary | ICD-10-CM | POA: Diagnosis not present

## 2023-08-10 DIAGNOSIS — M79671 Pain in right foot: Secondary | ICD-10-CM | POA: Diagnosis not present

## 2023-08-10 DIAGNOSIS — S0990XA Unspecified injury of head, initial encounter: Secondary | ICD-10-CM | POA: Diagnosis not present

## 2023-08-10 DIAGNOSIS — I499 Cardiac arrhythmia, unspecified: Secondary | ICD-10-CM | POA: Diagnosis not present

## 2023-08-10 DIAGNOSIS — S9031XA Contusion of right foot, initial encounter: Secondary | ICD-10-CM | POA: Diagnosis not present

## 2023-08-10 DIAGNOSIS — Z7982 Long term (current) use of aspirin: Secondary | ICD-10-CM | POA: Diagnosis not present

## 2023-08-10 DIAGNOSIS — Z043 Encounter for examination and observation following other accident: Secondary | ICD-10-CM | POA: Diagnosis not present

## 2023-08-10 DIAGNOSIS — S199XXA Unspecified injury of neck, initial encounter: Secondary | ICD-10-CM | POA: Diagnosis not present

## 2023-08-10 DIAGNOSIS — S3991XA Unspecified injury of abdomen, initial encounter: Secondary | ICD-10-CM | POA: Diagnosis not present

## 2023-08-10 DIAGNOSIS — R600 Localized edema: Secondary | ICD-10-CM | POA: Diagnosis not present

## 2023-08-10 DIAGNOSIS — S299XXA Unspecified injury of thorax, initial encounter: Secondary | ICD-10-CM | POA: Diagnosis not present

## 2023-08-10 DIAGNOSIS — Z9181 History of falling: Secondary | ICD-10-CM | POA: Diagnosis present

## 2023-08-10 DIAGNOSIS — M85871 Other specified disorders of bone density and structure, right ankle and foot: Secondary | ICD-10-CM | POA: Diagnosis not present

## 2023-08-10 DIAGNOSIS — I672 Cerebral atherosclerosis: Secondary | ICD-10-CM | POA: Diagnosis not present

## 2023-08-10 DIAGNOSIS — W19XXXA Unspecified fall, initial encounter: Secondary | ICD-10-CM | POA: Diagnosis not present

## 2023-08-10 DIAGNOSIS — R6889 Other general symptoms and signs: Secondary | ICD-10-CM | POA: Diagnosis not present

## 2023-08-10 DIAGNOSIS — Z743 Need for continuous supervision: Secondary | ICD-10-CM | POA: Diagnosis not present

## 2023-08-10 LAB — CBC WITH DIFFERENTIAL/PLATELET
Abs Immature Granulocytes: 0.01 10*3/uL (ref 0.00–0.07)
Basophils Absolute: 0 10*3/uL (ref 0.0–0.1)
Basophils Relative: 0 %
Eosinophils Absolute: 0 10*3/uL (ref 0.0–0.5)
Eosinophils Relative: 0 %
HCT: 38.3 % (ref 36.0–46.0)
Hemoglobin: 11.8 g/dL — ABNORMAL LOW (ref 12.0–15.0)
Immature Granulocytes: 0 %
Lymphocytes Relative: 24 %
Lymphs Abs: 1.4 10*3/uL (ref 0.7–4.0)
MCH: 28.6 pg (ref 26.0–34.0)
MCHC: 30.8 g/dL (ref 30.0–36.0)
MCV: 92.7 fL (ref 80.0–100.0)
Monocytes Absolute: 0.5 10*3/uL (ref 0.1–1.0)
Monocytes Relative: 8 %
Neutro Abs: 3.9 10*3/uL (ref 1.7–7.7)
Neutrophils Relative %: 68 %
Platelets: 276 10*3/uL (ref 150–400)
RBC: 4.13 MIL/uL (ref 3.87–5.11)
RDW: 14.9 % (ref 11.5–15.5)
WBC: 5.9 10*3/uL (ref 4.0–10.5)
nRBC: 0 % (ref 0.0–0.2)

## 2023-08-10 LAB — URINALYSIS, ROUTINE W REFLEX MICROSCOPIC
Bilirubin Urine: NEGATIVE
Glucose, UA: NEGATIVE mg/dL
Hgb urine dipstick: NEGATIVE
Ketones, ur: NEGATIVE mg/dL
Leukocytes,Ua: NEGATIVE
Nitrite: NEGATIVE
Protein, ur: NEGATIVE mg/dL
Specific Gravity, Urine: 1.024 (ref 1.005–1.030)
pH: 6 (ref 5.0–8.0)

## 2023-08-10 LAB — COMPREHENSIVE METABOLIC PANEL
ALT: 14 U/L (ref 0–44)
AST: 26 U/L (ref 15–41)
Albumin: 3.3 g/dL — ABNORMAL LOW (ref 3.5–5.0)
Alkaline Phosphatase: 85 U/L (ref 38–126)
Anion gap: 7 (ref 5–15)
BUN: 13 mg/dL (ref 8–23)
CO2: 28 mmol/L (ref 22–32)
Calcium: 9.1 mg/dL (ref 8.9–10.3)
Chloride: 103 mmol/L (ref 98–111)
Creatinine, Ser: 0.66 mg/dL (ref 0.44–1.00)
GFR, Estimated: >60 mL/min (ref >60)
Glucose, Bld: 88 mg/dL (ref 70–99)
Potassium: 2.8 mmol/L — ABNORMAL LOW (ref 3.5–5.1)
Sodium: 138 mmol/L (ref 135–145)
Total Bilirubin: 0.9 mg/dL (ref 0.0–1.2)
Total Protein: 7 g/dL (ref 6.5–8.1)

## 2023-08-10 MED ORDER — OXYCODONE-ACETAMINOPHEN 5-325 MG PO TABS
1.0000 | ORAL_TABLET | Freq: Once | ORAL | Status: AC
  Administered 2023-08-10: 1 via ORAL
  Filled 2023-08-10: qty 1

## 2023-08-10 MED ORDER — TRAZODONE HCL 50 MG PO TABS: 50.0000 mg | ORAL_TABLET | Freq: Every day | ORAL | 0 refills | Status: DC

## 2023-08-10 MED ORDER — FENTANYL CITRATE PF 50 MCG/ML IJ SOSY
50.0000 ug | PREFILLED_SYRINGE | Freq: Once | INTRAMUSCULAR | Status: AC
  Administered 2023-08-10: 50 ug via INTRAVENOUS
  Filled 2023-08-10: qty 1

## 2023-08-10 MED ORDER — LORAZEPAM 2 MG/ML IJ SOLN
1.0000 mg | Freq: Once | INTRAMUSCULAR | Status: AC
  Administered 2023-08-10: 1 mg via INTRAVENOUS
  Filled 2023-08-10: qty 1

## 2023-08-10 MED ORDER — ALUM & MAG HYDROXIDE-SIMETH 200-200-20 MG/5ML PO SUSP
30.0000 mL | Freq: Once | ORAL | Status: AC
  Administered 2023-08-10: 30 mL via ORAL
  Filled 2023-08-10: qty 30

## 2023-08-10 MED ORDER — IOHEXOL 300 MG/ML  SOLN
100.0000 mL | Freq: Once | INTRAMUSCULAR | Status: AC | PRN
  Administered 2023-08-10: 100 mL via INTRAVENOUS

## 2023-08-10 MED ORDER — LACTATED RINGERS IV BOLUS
1000.0000 mL | Freq: Once | INTRAVENOUS | Status: DC
  Administered 2023-08-10: 1000 mL via INTRAVENOUS

## 2023-08-10 MED ORDER — ALPRAZOLAM 0.5 MG PO TABS
0.5000 mg | ORAL_TABLET | Freq: Once | ORAL | Status: AC
Start: 2023-08-10 — End: 2023-08-10
  Administered 2023-08-10: 0.5 mg via ORAL
  Filled 2023-08-10: qty 1

## 2023-08-10 MED ORDER — POTASSIUM CHLORIDE CRYS ER 20 MEQ PO TBCR
60.0000 meq | EXTENDED_RELEASE_TABLET | Freq: Once | ORAL | Status: AC
  Administered 2023-08-10: 60 meq via ORAL
  Filled 2023-08-10: qty 3

## 2023-08-10 MED ORDER — FENTANYL CITRATE PF 50 MCG/ML IJ SOSY
25.0000 ug | PREFILLED_SYRINGE | Freq: Once | INTRAMUSCULAR | Status: AC
  Administered 2023-08-10: 25 ug via INTRAVENOUS
  Filled 2023-08-10: qty 1

## 2023-08-10 NOTE — ED Provider Notes (Signed)
   ED Course / MDM   Clinical Course as of 08/10/23 2243  Mon Aug 10, 2023  1521 Received sign out from Dr. Albertina, presenting with fall, pending CT scans, XR ankle. Hx dementia, coming from SNF [WS]  2047 Her workup is overall reassuring.  No evidence of any traumatic injuries.  Her labs are reassuring.  She remains fairly agitated.  Her daughter was requesting psychiatric consult given behavioral issues.  Discussed with daughter there is no apparent acute psychiatric emergency, patient not suicidal or homicidal, behavioral changes seem consistent with dementia.  I do not think there is a medical reason for the patient to be hospitalized.  She probably needs a higher level of skilled nursing care.  Will write this on the discharge with instructions.  Patient also not sleeping very well per daughter.  Will prescribe trazodone  to help with this. [WS]    Clinical Course User Index [WS] Francesca Elsie CROME, MD   Medical Decision Making Amount and/or Complexity of Data Reviewed Labs: ordered. Radiology: ordered.  Risk OTC drugs. Prescription drug management.         Francesca Elsie CROME, MD 08/10/23 [phone]

## 2023-08-10 NOTE — ED Triage Notes (Signed)
 Pt arrived via [facility] c/o multiple falls from [place] within the last 24 hours, as well as AMS, pt does have a Hx of a fib, also has a pacemaker.defibrillator. pt appears to be restless at this time. Unknown if pt experienced LOC,  bruising noted all over extremities, guarding R foot upon palpitation. Bruising noted on top of R foot. Disorientated x 4

## 2023-08-10 NOTE — ED Notes (Signed)
 Peri care performed, pt placed in brief

## 2023-08-10 NOTE — ED Notes (Signed)
 Legal Guardian, [name], called and stated they would like a psych evaluation on pt--MD made aware

## 2023-08-10 NOTE — Discharge Instructions (Addendum)
 We evaluated [name] for her falls.  Her imaging was negative for any new injury.  Her laboratory testing was also reassuring.  She probably needs more observation and care.  It would be best if she is observed at all times so that she does not crawl out of bed and have further falls when she is unsupervised.  I have prescribed some additional medication called trazodone  which she can take nightly to help with sleep.  She should follow-up with a neurologist.  I have placed a neurology referral.  She should also follow-up with her primary doctor.

## 2023-08-10 NOTE — ED Provider Notes (Signed)
 [place] EMERGENCY DEPARTMENT AT [facility] Provider Note  CSN: [id number] Arrival date & time: 08/10/23 1322  Chief Complaint(s) Fall  HPI [name] is a 88 y.o. female with PMH atrial fibrillation, COPD, CAD status post [name] syndrome, tachybradycardia syndrome status post pacemaker placement who presents emergency ferment for evaluation of a fall.  Patient was seen on 08/05/2022 for similar complaint and received extensive trauma imaging that was reassuringly negative.  Patient arrives with significant agitation complaining of pain in the foot with an essential tremor in the upper extremities which is previously documented.  Patient currently lives in a skilled nursing facilities and has suffered multiple falls over the last 24 hours.   Past Medical History 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    Essential hypertension    GERD (gastroesophageal reflux disease)    H/O hiatal hernia    Headache(784.0)    HOH (hard of hearing)    Hyperlipidemia    Neuromuscular disorder (HCC)    Tremors   Pericardial effusion    Hemorrhagic    Presence of permanent cardiac pacemaker    Tachycardia-bradycardia syndrome (HCC)    s/p Medtronic Adapta L dual chamber device  5/10   Patient Active Problem List   Diagnosis Date Noted   COVID-19 07/05/2023   CHF (congestive heart failure) (HCC) 03/12/2023   NSTEMI (non-ST elevated myocardial infarction) (HCC) 03/11/2023   Dehydration 11/25/2022   Failure to thrive in adult 11/25/2022   Acute metabolic encephalopathy 11/06/2022   Frequent falls 11/06/2022   Right sided weakness 11/05/2022   Paranoid delusion (HCC) 10/28/2022   Current moderate episode of major depressive  disorder (HCC) 10/28/2022   Community acquired pneumonia 10/27/2022   Hardening of the aorta (main artery of the heart) (HCC) 10/27/2022   Pruritic rash 10/27/2022   Generalized abdominal pain 09/29/2022   COPD (chronic obstructive pulmonary disease) (HCC) 06/29/2022   Chronic respiratory failure with hypoxia (HCC) 06/29/2022   Chronic systolic heart failure (HCC) 06/29/2022   IPMN (intraductal papillary mucinous neoplasm) 06/09/2022   Constipation 06/09/2022   Atherosclerosis of coronary artery without angina pectoris 04/23/2022   Abdominal pain, chronic, right upper quadrant 12/03/2021   Hearing difficulty 06/19/2021   Neck pain 05/16/2021   CAD (coronary artery disease) 05/02/2021   Nausea without vomiting 04/17/2021   Productive cough 04/17/2021   Chest pain 03/30/2021   COVID-19 virus infection 03/30/2021   Non-STEMI (non-ST elevated myocardial infarction) (HCC)    Mass of pancreas 03/20/2021   Hypertensive urgency 03/19/2021   Elevated troponin 03/19/2021   Edema of lower extremity 01/14/2021   Impaired fasting glucose 01/07/2021   Generalized anxiety disorder 01/07/2021   Essential tremor 01/07/2021   Proteinuria 01/07/2021   Essential hypertension 01/02/2021   Iron  deficiency anemia 01/02/2021   Prediabetes 01/02/2021   Difficulty swallowing 07/03/2020   Nonspecific chest pain 09/15/2016   Complete rotator cuff tear of left shoulder    Anemia 10/31/2014   H/O heart artery stent 10/30/2014   Chest pain with moderate risk for cardiac etiology 08/23/2014   Diastolic dysfunction with chronic heart failure (HCC) 08/23/2014   Chronic diastolic heart failure/GD11--EF 60 to 65 % 08/23/2014  Abdominal pain 08/23/2014   Acquired obstruction of both nasolacrimal ducts 06/15/2014   Lower abdominal pain 01/27/2012   Hypokalemia 06/03/2011   Disease of pericardium 07/10/2009   Disease of pericardium 07/10/2009   Essential hypertension, benign 04/18/2009   Triple vessel  disease of the heart 04/18/2009   Benign essential hypertension 04/18/2009   PPM-Medtronic 04/04/2009   Sinus node dysfunction (HCC) 01/26/2009   HLD (hyperlipidemia) 12/19/2008   Anxiety 12/19/2008   Persistent atrial fibrillation (HCC) 12/19/2008   GERD (gastroesophageal reflux disease) 12/19/2008   Abnormal involuntary movements 12/19/2008   Atrial fibrillation, chronic (HCC) 12/19/2008   Mixed hyperlipidemia 12/19/2008   Home Medication(s) Prior to Admission medications   Medication Sig Start Date End Date Taking? Authorizing Provider  acetaminophen  (TYLENOL ) 325 MG tablet Take 2 tablets (650 mg total) by mouth every 6 (six) hours as needed for mild pain (or Fever >/= 101). 11/25/22  Yes Emokpae, Courage, MD  acetaminophen  (TYLENOL ) 500 MG tablet Take 1,000 mg by mouth every 6 (six) hours as needed for mild pain (pain score 1-3).   Yes [provider]  albuterol  (PROVENTIL ) (2.5 MG/3ML) 0.083% nebulizer solution Take 3 mLs (2.5 mg total) by nebulization every 4 (four) hours as needed for wheezing or shortness of breath. 07/07/23 07/06/24 Yes Emokpae, Courage, MD  albuterol  (VENTOLIN  HFA) 108 (90 Base) MCG/ACT inhaler Inhale 1 puff into the lungs every 4 (four) hours as needed for wheezing or shortness of breath. 07/07/23  Yes Pearlean Manus, MD  ALPRAZolam  (XANAX ) 0.5 MG tablet Take 1 tablet (0.5 mg total) by mouth at bedtime as needed for anxiety or sleep. Patient taking differently: Take 0.5 mg by mouth in the morning, at noon, in the evening, and at bedtime. For 14 days 07/07/23  Yes Emokpae, Courage, MD  amiodarone  (PACERONE ) 200 MG tablet Take 1 tablet (200 mg total) by mouth daily. 03/16/23  Yes Amin, Ankit C, MD  aspirin  EC 81 MG tablet Take 1 tablet (81 mg total) by mouth daily with breakfast. Swallow whole. 07/02/22  Yes Emokpae, Courage, MD  atorvastatin  (LIPITOR ) 80 MG tablet Take 1 tablet (80 mg total) by mouth daily. 02/06/23  Yes Debera Jayson MATSU, MD  Cyanocobalamin   (VITAMIN B-12) 5000 MCG SUBL Take 5,000 mcg by mouth daily.   Yes [provider]  dicyclomine  (BENTYL ) 10 MG capsule Take 1 capsule (10 mg total) by mouth every 12 (twelve) hours as needed for spasms (abdominal pain). 12/08/22  Yes Eartha Angelia Sieving, MD  Ensure Plus (ENSURE PLUS) LIQD Take 237 mLs by mouth 3 (three) times daily between meals.   Yes [provider]  folic acid  (FOLVITE ) 1 MG tablet Take 1 tablet (1 mg total) by mouth daily. 11/12/22  Yes Tat, Alm, MD  furosemide  (LASIX ) 20 MG tablet Take 1 tablet (20 mg total) by mouth every evening. 07/07/23 07/06/24 Yes Emokpae, Courage, MD  furosemide  (LASIX ) 40 MG tablet Take 1 tablet (40 mg total) by mouth daily. 07/07/23  Yes Emokpae, Courage, MD  guaifenesin  (ROBITUSSIN) 100 MG/5ML syrup Take 200 mg by mouth every 6 (six) hours as needed for cough.   Yes [provider]  isosorbide  mononitrate (IMDUR ) 30 MG 24 hr tablet Take 1 tablet (30 mg total) by mouth daily. 03/16/23  Yes Amin, Ankit C, MD  melatonin 5 MG TABS Take 5 mg by mouth at bedtime.   Yes [provider]  metoprolol  succinate (TOPROL -XL) 50 MG 24 hr tablet Take 1 tablet (50 mg total) by mouth 2 (  two) times daily. Take with or immediately following a meal. 03/15/23  Yes Amin, Ankit C, MD  nitroGLYCERIN  (NITROLINGUAL ) 0.4 MG/SPRAY spray Place 1 spray under the tongue every 5 (five) minutes x 3 doses as needed for chest pain. 07/07/23  Yes [provider]  Nutritional Supplements (NUTRITIONAL SUPPLEMENT PO) Take 237 mLs by mouth in the morning, at noon, and at bedtime.   Yes [provider]  ondansetron  (ZOFRAN ) 4 MG tablet Take 4 mg by mouth every 6 (six) hours as needed for vomiting or nausea.   Yes [provider]  pantoprazole  (PROTONIX ) 20 MG tablet Take 1 tablet (20 mg total) by mouth daily. 04/20/23  Yes Eartha Angelia Sieving, MD  PARoxetine  (PAXIL ) 10 MG tablet Take 20 mg by mouth daily. 06/26/23  Yes [provider]  Potassium Chloride  ER 20 MEQ TBCR Take 1 tablet (20 mEq total) by mouth daily. 1 tab daily by mouth 02/06/23 11/03/23 Yes Debera Jayson MATSU, MD  ranolazine  (RANEXA ) 500 MG 12 hr tablet Take 1 tablet (500 mg total) by mouth 2 (two) times daily. 07/02/22  Yes Emokpae, Courage, MD  rOPINIRole (REQUIP) 0.5 MG tablet Take 0.5 mg by mouth daily. 01/20/23  Yes [provider]  senna-docusate (SENOKOT-S) 8.6-50 MG tablet Take 2 tablets by mouth at bedtime. 11/25/22 11/25/23 Yes Emokpae, Courage, MD  ticagrelor  (BRILINTA ) 60 MG TABS tablet Take 1 tablet (60 mg total) by mouth 2 (two) times daily. 07/07/23  Yes Emokpae, Courage, MD  traZODone  (DESYREL ) 50 MG tablet Take 1 tablet (50 mg total) by mouth at bedtime. 08/10/23  Yes Francesca Elsie CROME, MD  vitamin E  180 MG (400 UNITS) capsule Take 400 Units by mouth daily.   Yes [provider]                                                                                                                                    Past Surgical History Past Surgical History:  Procedure Laterality Date   APPENDECTOMY     BIOPSY  02/24/2022   Procedure: BIOPSY;  Surgeon: Wilhelmenia Aloha Raddle., MD;  Location: [facility];  Service: Gastroenterology;;   CARDIAC CATHETERIZATION  2010   stents x2.   CATARACT EXTRACTION     CHOLECYSTECTOMY     COLONOSCOPY  2011   COLONOSCOPY N/A 10/19/2014   Procedure: COLONOSCOPY;  Surgeon: Claudis RAYMOND Rivet, MD;  Location: [facility];  Service: Endoscopy;  Laterality: N/A;  1030   CORONARY STENT INTERVENTION N/A 05/28/2021   Procedure: CORONARY STENT INTERVENTION;  Surgeon: Anner Alm ORN, MD;  Location: [facility] INVASIVE CV LAB;  Service: Cardiovascular;  Laterality: N/A;   CORONARY ULTRASOUND/IVUS N/A 05/28/2021   Procedure: Intravascular Ultrasound/IVUS;  Surgeon: Anner Alm ORN, MD;  Location: [facility] INVASIVE CV LAB;  Service: Cardiovascular;  Laterality: N/A;   ESOPHAGEAL DILATION N/A 05/02/2020   Procedure:  ESOPHAGEAL DILATION;  Surgeon: Rivet Claudis RAYMOND, MD;  Location: [facility];  Service: Gastroenterology;  Laterality: N/A;   ESOPHAGOGASTRODUODENOSCOPY N/A 10/26/2014   Procedure: ESOPHAGOGASTRODUODENOSCOPY (EGD);  Surgeon: Claudis RAYMOND Rivet, MD;  Location: [facility];  Service: Endoscopy;  Laterality: N/A;  230 - Dr. has lunch and learn   ESOPHAGOGASTRODUODENOSCOPY N/A 02/24/2022   Procedure: ESOPHAGOGASTRODUODENOSCOPY (EGD);  Surgeon: Wilhelmenia Aloha Raddle., MD;  Location: [facility];  Service: Gastroenterology;  Laterality: N/A;   ESOPHAGOGASTRODUODENOSCOPY (EGD) WITH PROPOFOL  N/A 05/02/2020   Procedure: ESOPHAGOGASTRODUODENOSCOPY (EGD) WITH PROPOFOL ;  Surgeon: Rivet Claudis RAYMOND, MD;  Location: [facility];  Service: Gastroenterology;  Laterality: N/A;  250   Esophagogastroduodenoscopy with esophageal dilation  2004, 2006, 2007   EUS N/A 02/24/2022   Procedure: UPPER ENDOSCOPIC ULTRASOUND (EUS) LINEAR;  Surgeon: Wilhelmenia Aloha Raddle., MD;  Location: [facility];  Service: Gastroenterology;  Laterality: N/A;   FINE NEEDLE ASPIRATION  02/24/2022   Procedure: FINE NEEDLE ASPIRATION;  Surgeon: Wilhelmenia Aloha Raddle., MD;  Location: [facility];  Service: Gastroenterology;;  red path sent   GIVENS CAPSULE STUDY N/A 10/31/2014   Procedure: GIVENS CAPSULE STUDY;  Surgeon: Claudis RAYMOND Rivet, MD;  Location: [facility];  Service: Endoscopy;  Laterality: N/A;  730 -- pacemaker--needs monitoring--outpatient bed   INSERT / REPLACE / REMOVE PACEMAKER  2010   LEFT HEART CATH AND CORONARY ANGIOGRAPHY N/A 03/21/2021   Procedure: LEFT HEART CATH AND CORONARY ANGIOGRAPHY;  Surgeon: Claudene Victory ORN, MD;  Location: [facility] INVASIVE CV LAB;  Service: Cardiovascular;  Laterality: N/A;   LEFT HEART CATHETERIZATION WITH CORONARY ANGIOGRAM N/A 11/17/2011   Procedure: LEFT HEART CATHETERIZATION WITH CORONARY ANGIOGRAM;  Surgeon: Ozell Fell, MD;  Location: [facility] CATH LAB;  Service: Cardiovascular;  Laterality: N/A;   LEFT  HEART CATHETERIZATION WITH CORONARY ANGIOGRAM N/A 09/26/2014   Procedure: LEFT HEART CATHETERIZATION WITH CORONARY ANGIOGRAM;  Surgeon: Alm ORN Clay, MD;  Location: [facility] CATH LAB;  Service: Cardiovascular;  Laterality: N/A;   PPM GENERATOR CHANGEOUT N/A 07/16/2022   Procedure: PPM GENERATOR CHANGEOUT;  Surgeon: Waddell Danelle ORN, MD;  Location: [facility] INVASIVE CV LAB;  Service: Cardiovascular;  Laterality: N/A;   Right rotator cuff repair     SAVORY DILATION N/A 02/24/2022   Procedure: SAVORY DILATION;  Surgeon: Wilhelmenia Aloha Raddle., MD;  Location: [facility];  Service: Gastroenterology;  Laterality: N/A;   SHOULDER ACROMIOPLASTY Right 05/30/2015   Procedure: RIGHT SHOULDER ACROMIOPLASTY;  Surgeon: Taft FORBES Minerva, MD;  Location: [facility];  Service: Orthopedics;  Laterality: Right;   SHOULDER OPEN ROTATOR CUFF REPAIR Right 05/30/2015   Procedure: OPEN ROTATOR CUFF REPAIR RIGHT SHOULDER;  Surgeon: Taft FORBES Minerva, MD;  Location: [facility];  Service: Orthopedics;  Laterality: Right;   SHOULDER OPEN ROTATOR CUFF REPAIR Left 10/22/2016   Procedure: ROTATOR CUFF REPAIR SHOULDER OPEN;  Surgeon: Taft FORBES Minerva, MD;  Location: [facility];  Service: Orthopedics;  Laterality: Left;   Subxiphoid pericardial window  11/10   VAGINAL HYSTERECTOMY     Family History Family History  Problem Relation Age of Onset   Cancer Mother        Colon    Coronary artery disease Sister    Coronary artery disease Brother    Arthritis Other    Lung disease Other    Asthma Other     Social History Social History   Tobacco Use   Smoking status: Former    Current packs/day: 0.00    Average packs/day: 2.0 packs/day for 10.0 years (20.0 ttl pk-yrs)    Types: Cigarettes  Start date: 05/24/1979    Quit date: 05/23/1989    Years since quitting: 34.2    Passive exposure: Past   Smokeless tobacco: Never   Tobacco comments:    Verified by Granddaughter, [name]  Vaping Use   Vaping status: Never Used   Substance Use Topics   Alcohol use: No    Alcohol/week: 0.0 standard drinks of alcohol   Drug use: No   Allergies Amitriptyline hcl, Plavix  [clopidogrel  bisulfate], and Sulfonamide derivatives  Review of Systems Review of Systems  Musculoskeletal:  Positive for arthralgias and myalgias.    Physical Exam Vital Signs  I have reviewed the triage vital signs BP (!) 178/83   Pulse 99   Temp 98.2 F (36.8 C) (Oral)   Resp 20   SpO2 95%   Physical Exam Vitals and nursing note reviewed.  Constitutional:      General: She is not in acute distress.    Appearance: She is well-developed.  HENT:     Head: Normocephalic and atraumatic.  Eyes:     Conjunctiva/sclera: Conjunctivae normal.  Cardiovascular:     Rate and Rhythm: Normal rate and regular rhythm.     Heart sounds: No murmur heard. Pulmonary:     Effort: Pulmonary effort is normal. No respiratory distress.     Breath sounds: Normal breath sounds.  Abdominal:     Palpations: Abdomen is soft.     Tenderness: There is no abdominal tenderness.  Musculoskeletal:        General: Tenderness present. No swelling.     Cervical back: Neck supple.  Skin:    General: Skin is warm and dry.     Capillary Refill: Capillary refill takes less than 2 seconds.     Findings: Bruising present.  Neurological:     Mental Status: She is alert.  Psychiatric:        Mood and Affect: Mood normal.     ED Results and Treatments Labs (all labs ordered are listed, but only abnormal results are displayed) Labs Reviewed  COMPREHENSIVE METABOLIC PANEL - Abnormal; Notable for the following components:      Result Value   Potassium 2.8 (*)    Albumin 3.3 (*)    All other components within normal limits  CBC WITH DIFFERENTIAL/PLATELET - Abnormal; Notable for the following components:   Hemoglobin 11.8 (*)    All other components within normal limits  URINALYSIS, ROUTINE W REFLEX MICROSCOPIC                                                                                                                           Radiology CT Foot Right Wo Contrast Result Date: 08/10/2023 CLINICAL DATA:  Foot pain, evaluate for stress fracture. EXAM: CT OF THE RIGHT FOOT WITHOUT CONTRAST TECHNIQUE: Multidetector CT imaging of the right foot was performed according to the standard protocol. Multiplanar CT image reconstructions were also generated. RADIATION DOSE REDUCTION: This exam was performed according to the departmental  dose-optimization program which includes automated exposure control, adjustment of the mA and/or kV according to patient size and/or use of iterative reconstruction technique. COMPARISON:  Right foot x-ray same day FINDINGS: Bones/Joint/Cartilage Bones are diffusely osteopenic. There is no evidence for acute or healed fracture. No focal osseous lesion identified. No significant joint effusion. Ligaments Suboptimally assessed by CT. Muscles and Tendons Within normal limits. Soft tissues There is subcutaneous edema and swelling of the plantar surface of the foot. No soft tissue gas or foreign body. IMPRESSION: 1. No evidence for acute or healed fracture. 2. Diffuse osteopenia. 3. Subcutaneous edema and swelling of the plantar surface of the foot. Electronically Signed   By: Greig Pique M.D.   On: 08/10/2023 20:10   DG Foot Complete Right Result Date: 08/10/2023 CLINICAL DATA:  Foot pain.  Multiple falls. EXAM: RIGHT FOOT COMPLETE - 3+ VIEW COMPARISON:  None Available. FINDINGS: Diffuse osteopenia. Evaluation of toes somewhat limited by positioning. No visible fracture, subluxation or dislocation. Soft tissues are intact. IMPRESSION: Osteopenia. No acute bony abnormality. Electronically Signed   By: Franky Crease M.D.   On: 08/10/2023 19:13   DG Ankle Complete Right Result Date: 08/10/2023 CLINICAL DATA:  Fall.  Rule out fracture. EXAM: RIGHT ANKLE - COMPLETE 3+ VIEW COMPARISON:  None Available. FINDINGS: There is diffuse osteopenia of the  visualized osseous structures. No acute fracture or dislocation. No aggressive osseous lesion. Ankle mortise appears intact. No focal soft tissue swelling. No radiopaque foreign bodies. IMPRESSION: No acute osseous abnormality of the right ankle. Electronically Signed   By: Ree Molt M.D.   On: 08/10/2023 16:57   CT HEAD WO CONTRAST ( ) Result Date: 08/10/2023 CLINICAL DATA:  Head trauma, minor (Age >= 65y); Neck trauma (Age >= 65y) EXAM: CT HEAD WITHOUT CONTRAST CT CERVICAL SPINE WITHOUT CONTRAST TECHNIQUE: Multidetector CT imaging of the head and cervical spine was performed following the standard protocol without intravenous contrast. Multiplanar CT image reconstructions of the cervical spine were also generated. RADIATION DOSE REDUCTION: This exam was performed according to the departmental dose-optimization program which includes automated exposure control, adjustment of the mA and/or kV according to patient size and/or use of iterative reconstruction technique. COMPARISON:  CT scan head and cervical spine from 08/06/2023. FINDINGS: CT HEAD FINDINGS Brain: No evidence of acute infarction, hemorrhage, hydrocephalus, extra-axial collection or mass lesion/mass effect. There is bilateral periventricular hypodensity, which is non-specific but most likely seen in the settings of microvascular ischemic changes. Mild in extent. Old lacunar infarct noted in the left basal ganglia. Otherwise normal appearance of brain parenchyma. Ventricles are normal. Cerebral volume is age appropriate. Vascular: No hyperdense vessel or unexpected calcification. Intracranial arteriosclerosis. Skull: Normal. Negative for fracture or focal lesion. Sinuses/Orbits: No acute finding. Other: Visualized mastoid air cells are unremarkable. No mastoid effusion. CT CERVICAL SPINE FINDINGS Alignment: Normal. This examination does not assess for ligamentous injury or stability. Skull base and vertebrae: No acute fracture. No primary bone  lesion or focal pathologic process. Soft tissues and spinal canal: No prevertebral fluid or swelling. No visible canal hematoma. Disc levels: Moderate multilevel degenerative changes characterized by reduced intervertebral disc height, facet arthropathy and marginal osteophyte formation, most pronounced at C4-5 through C6-7 levels. Upper chest: Negative. Other: None. IMPRESSION: *No acute intracranial abnormality. *No acute osseous injury or traumatic listhesis of the cervical spine. Electronically Signed   By: Ree Molt M.D.   On: 08/10/2023 16:51   CT CERVICAL SPINE WO CONTRAST Result Date: 08/10/2023 CLINICAL DATA:  Head trauma,  minor (Age >= 65y); Neck trauma (Age >= 65y) EXAM: CT HEAD WITHOUT CONTRAST CT CERVICAL SPINE WITHOUT CONTRAST TECHNIQUE: Multidetector CT imaging of the head and cervical spine was performed following the standard protocol without intravenous contrast. Multiplanar CT image reconstructions of the cervical spine were also generated. RADIATION DOSE REDUCTION: This exam was performed according to the departmental dose-optimization program which includes automated exposure control, adjustment of the mA and/or kV according to patient size and/or use of iterative reconstruction technique. COMPARISON:  CT scan head and cervical spine from 08/06/2023. FINDINGS: CT HEAD FINDINGS Brain: No evidence of acute infarction, hemorrhage, hydrocephalus, extra-axial collection or mass lesion/mass effect. There is bilateral periventricular hypodensity, which is non-specific but most likely seen in the settings of microvascular ischemic changes. Mild in extent. Old lacunar infarct noted in the left basal ganglia. Otherwise normal appearance of brain parenchyma. Ventricles are normal. Cerebral volume is age appropriate. Vascular: No hyperdense vessel or unexpected calcification. Intracranial arteriosclerosis. Skull: Normal. Negative for fracture or focal lesion. Sinuses/Orbits: No acute finding. Other:  Visualized mastoid air cells are unremarkable. No mastoid effusion. CT CERVICAL SPINE FINDINGS Alignment: Normal. This examination does not assess for ligamentous injury or stability. Skull base and vertebrae: No acute fracture. No primary bone lesion or focal pathologic process. Soft tissues and spinal canal: No prevertebral fluid or swelling. No visible canal hematoma. Disc levels: Moderate multilevel degenerative changes characterized by reduced intervertebral disc height, facet arthropathy and marginal osteophyte formation, most pronounced at C4-5 through C6-7 levels. Upper chest: Negative. Other: None. IMPRESSION: *No acute intracranial abnormality. *No acute osseous injury or traumatic listhesis of the cervical spine. Electronically Signed   By: Ree Molt M.D.   On: 08/10/2023 16:51   CT CHEST ABDOMEN PELVIS W CONTRAST Result Date: 08/10/2023 CLINICAL DATA:  Polytrauma, blunt. EXAM: CT CHEST, ABDOMEN, AND PELVIS WITH CONTRAST TECHNIQUE: Multidetector CT imaging of the chest, abdomen and pelvis was performed following the standard protocol during bolus administration of intravenous contrast. RADIATION DOSE REDUCTION: This exam was performed according to the departmental dose-optimization program which includes automated exposure control, adjustment of the mA and/or kV according to patient size and/or use of iterative reconstruction technique. CONTRAST:  OMNIPAQUE  IOHEXOL  300 MG/ML  SOLN COMPARISON:  CT scan chest, abdomen and pelvis from 08/06/2023. FINDINGS: Please note examination is degraded by patient's motion during data acquisition. CT CHEST FINDINGS Cardiovascular: Normal cardiac size. No pericardial effusion. No aortic aneurysm. There are coronary artery calcifications, in keeping with coronary artery disease. There are also moderate peripheral atherosclerotic vascular calcifications of thoracic aorta and its major branches. Left-sided cardiac pacemaker noted. Dense mitral annulus  calcifications noted. Mediastinum/Nodes: Redemonstration of heterogeneous thyroid  gland, not well evaluated on the current exam. The evaluation is also limited due to streak artifacts. No solid / cystic mediastinal masses. The esophagus is nondistended precluding optimal assessment. No axillary, mediastinal or hilar lymphadenopathy by size criteria. Lungs/Pleura: The central tracheo-bronchial tree is patent. Mild upper lobe predominant paraseptal emphysematous changes noted. There are patchy areas of linear, plate-like atelectasis and/or scarring throughout bilateral lungs. No mass or consolidation. No pleural effusion or pneumothorax. No suspicious lung nodules. Musculoskeletal: The visualized soft tissues of the chest wall are grossly unremarkable. No suspicious osseous lesions. There are mild multilevel degenerative changes in the visualized spine. Evaluation for rib fracture is limited due to patient's motion during data acquisition. CT ABDOMEN PELVIS FINDINGS Hepatobiliary: The liver is normal in size. Non-cirrhotic configuration. No suspicious mass. No intrahepatic or extrahepatic bile duct dilation.  Gallbladder is surgically absent. Pancreas: Markedly limited evaluation of the pancreas due to motion. There are several subcentimeter sized hypoattenuating areas, not well evaluated on the current exam. One such area in the pancreatic body (series 5, image 46), may represent dilated pancreatic side branch. There are other several areas (series 5, images 53, 55 and, 56) which are not well characterized on the current exam. There is also mildly dilated main pancreatic duct. If clinically indicated, these lesions can be attempted to further characterize with contrast-enhanced MRI abdomen. Spleen: Size within normal limits. There are several calcified leiomyomas in the spleen. Adrenals/Urinary Tract: Adrenal glands are unremarkable. No suspicious renal mass. Focal scarring noted in the left kidney lower pole,  posteriorly. No nephroureterolithiasis or obstructive uropathy. Unremarkable urinary bladder. Stomach/Bowel: There is a small sliding hiatal hernia. No disproportionate dilation of the small or large bowel loops. No evidence of abnormal bowel wall thickening or inflammatory changes. The appendix was not visualized; however there is no acute inflammatory process in the right lower quadrant. Vascular/Lymphatic: No ascites or pneumoperitoneum. No abdominal or pelvic lymphadenopathy, by size criteria. No aneurysmal dilation of the major abdominal arteries. There are moderate peripheral atherosclerotic vascular calcifications of the aorta and its major branches. Reproductive: The uterus is surgically absent. No large adnexal mass. Other: The visualized soft tissues and abdominal wall are unremarkable. Musculoskeletal: Osteopenia. No suspicious osseous lesions. There are mild - moderate multilevel degenerative changes in the visualized spine. IMPRESSION: 1. No traumatic injury to the chest, abdomen or pelvis. 2. There are several subcentimeter sized hypoattenuating areas in the pancreas, as described in detail above, not well imaged and characterized on the current exam. If clinically indicated, attempt can be made with contrast-enhanced MRI abdomen to further characterize these lesions. 3. Multiple other nonacute observations, as described above. Electronically Signed   By: Ree Molt M.D.   On: 08/10/2023 16:46    Pertinent labs & imaging results that were available during my care of the patient were reviewed by me and considered in my medical decision making (see MDM for details).  Medications Ordered in ED Medications  LORazepam  (ATIVAN ) injection 1 mg (1 mg Intravenous Given 08/10/23 1452)  fentaNYL  (SUBLIMAZE ) injection 50 mcg (50 mcg Intravenous Given 08/10/23 1520)  iohexol  (OMNIPAQUE ) 300 MG/ML solution 100 mL (100 mLs Intravenous Contrast Given 08/10/23 1535)  oxyCODONE -acetaminophen  (PERCOCET/ROXICET)  5-325 MG per tablet 1 tablet (1 tablet Oral Given 08/10/23 1847)  lactated ringers  bolus 1,000 mL (1,000 mLs Intravenous Bolus 08/10/23 1851)  ALPRAZolam  (XANAX ) tablet 0.5 mg (0.5 mg Oral Given 08/10/23 2001)  potassium chloride  SA (KLOR-CON  M) CR tablet 60 mEq (60 mEq Oral Given 08/10/23 2053)  alum & mag hydroxide-simeth (MAALOX/MYLANTA) 200-200-20 MG/5ML suspension 30 mL (30 mLs Oral Given 08/10/23 2053)  fentaNYL  (SUBLIMAZE ) injection 25 mcg (25 mcg Intravenous Given 08/10/23 2053)  Procedures Procedures  (including critical care time)  Medical Decision Making / ED Course   This patient presents to the ED for concern of fall, this involves an extensive number of treatment options, and is a complaint that carries with it a high risk of complications and morbidity.  The differential diagnosis includes fracture, contusion, hematoma, ligamentous injury, closed head injury, ICH, laceration, intrathoracic injury, intra-abdominal injury  MDM: Patient seen emergency room for evaluation of multiple falls.  Physical exam reveals an agitated tremulous patient with fading bruising and tenderness over the ankle on the right.  Laboratory evaluation largely unremarkable.  Patient pending completion of trauma imaging at time of signout.  Please see provider signout continuation of workup.   Additional history obtained:  -External records from outside source obtained and reviewed including: Chart review including previous notes, labs, imaging, consultation notes   Lab Tests: -I ordered, reviewed, and interpreted labs.   The pertinent results include:   Labs Reviewed  COMPREHENSIVE METABOLIC PANEL - Abnormal; Notable for the following components:      Result Value   Potassium 2.8 (*)    Albumin 3.3 (*)    All other components within normal limits  CBC WITH  DIFFERENTIAL/PLATELET - Abnormal; Notable for the following components:   Hemoglobin 11.8 (*)    All other components within normal limits  URINALYSIS, ROUTINE W REFLEX MICROSCOPIC     Imaging Studies ordered: I ordered imaging studies including CT head, C-spine, chest abdomen pelvis, ankle x-ray and these are pending   Medicines ordered and prescription drug management: Meds ordered this encounter  Medications   LORazepam  (ATIVAN ) injection 1 mg   fentaNYL  (SUBLIMAZE ) injection 50 mcg   iohexol  (OMNIPAQUE ) 300 MG/ML solution 100 mL   oxyCODONE -acetaminophen  (PERCOCET/ROXICET) 5-325 MG per tablet 1 tablet    Refill:  0   lactated ringers  bolus 1,000 mL   ALPRAZolam  (XANAX ) tablet 0.5 mg   potassium chloride  SA (KLOR-CON  M) CR tablet 60 mEq   traZODone  (DESYREL ) 50 MG tablet    Sig: Take 1 tablet (50 mg total) by mouth at bedtime.    Dispense:  30 tablet    Refill:  0   alum & mag hydroxide-simeth (MAALOX/MYLANTA) 200-200-20 MG/5ML suspension 30 mL   fentaNYL  (SUBLIMAZE ) injection 25 mcg    -I have reviewed the patients home medicines and have made adjustments as needed  Critical interventions none   Social Determinants of Health:  Factors impacting patients care include: Lives in a skilled nursing facility   Reevaluation: After the interventions noted above, I reevaluated the patient and found that they have :improved  Co morbidities that complicate the patient evaluation  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  Please see provider signout note for continuation of workup.     Final Clinical Impression(s) / ED Diagnoses Final diagnoses:  Dementia with behavioral disturbance (HCC)  Frequent falls  Contusion of right foot, initial encounter     @PCDICTATION @    Albertina Dixon, MD 08/10/23 2147

## 2023-08-11 ENCOUNTER — Ambulatory Visit: Payer: 59 | Admitting: Physician Assistant

## 2023-08-11 ENCOUNTER — Ambulatory Visit: Payer: 59 | Admitting: Nurse Practitioner

## 2023-08-11 DIAGNOSIS — I10 Essential (primary) hypertension: Secondary | ICD-10-CM | POA: Diagnosis not present

## 2023-08-11 DIAGNOSIS — S9031XA Contusion of right foot, initial encounter: Secondary | ICD-10-CM | POA: Diagnosis not present

## 2023-08-11 DIAGNOSIS — R1312 Dysphagia, oropharyngeal phase: Secondary | ICD-10-CM | POA: Diagnosis not present

## 2023-08-11 DIAGNOSIS — M6281 Muscle weakness (generalized): Secondary | ICD-10-CM | POA: Diagnosis not present

## 2023-08-11 DIAGNOSIS — I214 Non-ST elevation (NSTEMI) myocardial infarction: Secondary | ICD-10-CM | POA: Diagnosis not present

## 2023-08-11 DIAGNOSIS — I509 Heart failure, unspecified: Secondary | ICD-10-CM | POA: Diagnosis not present

## 2023-08-11 MED ORDER — OXYCODONE-ACETAMINOPHEN 5-325 MG PO TABS
1.0000 | ORAL_TABLET | Freq: Once | ORAL | Status: AC
  Administered 2023-08-11: 1 via ORAL
  Filled 2023-08-11: qty 1

## 2023-08-11 MED ORDER — POTASSIUM CHLORIDE ER 20 MEQ PO TBCR: EXTENDED_RELEASE_TABLET | ORAL | 2 refills | Status: DC

## 2023-08-11 MED ORDER — TRAZODONE HCL 50 MG PO TABS: 50.0000 mg | ORAL_TABLET | Freq: Every day | ORAL | 0 refills | Status: DC

## 2023-08-11 MED ORDER — POTASSIUM CHLORIDE CRYS ER 20 MEQ PO TBCR
40.0000 meq | EXTENDED_RELEASE_TABLET | Freq: Once | ORAL | Status: AC
  Administered 2023-08-11: 40 meq via ORAL
  Filled 2023-08-11: qty 2

## 2023-08-12 DIAGNOSIS — I509 Heart failure, unspecified: Secondary | ICD-10-CM | POA: Diagnosis not present

## 2023-08-12 DIAGNOSIS — M6281 Muscle weakness (generalized): Secondary | ICD-10-CM | POA: Diagnosis not present

## 2023-08-12 DIAGNOSIS — I214 Non-ST elevation (NSTEMI) myocardial infarction: Secondary | ICD-10-CM | POA: Diagnosis not present

## 2023-08-12 DIAGNOSIS — I10 Essential (primary) hypertension: Secondary | ICD-10-CM | POA: Diagnosis not present

## 2023-08-12 DIAGNOSIS — R1312 Dysphagia, oropharyngeal phase: Secondary | ICD-10-CM | POA: Diagnosis not present

## 2023-08-13 DIAGNOSIS — W19XXXA Unspecified fall, initial encounter: Secondary | ICD-10-CM | POA: Diagnosis not present

## 2023-08-13 DIAGNOSIS — E889 Metabolic disorder, unspecified: Secondary | ICD-10-CM | POA: Diagnosis not present

## 2023-08-13 DIAGNOSIS — I10 Essential (primary) hypertension: Secondary | ICD-10-CM | POA: Diagnosis not present

## 2023-08-13 DIAGNOSIS — M6281 Muscle weakness (generalized): Secondary | ICD-10-CM | POA: Diagnosis not present

## 2023-08-13 DIAGNOSIS — R1312 Dysphagia, oropharyngeal phase: Secondary | ICD-10-CM | POA: Diagnosis not present

## 2023-08-13 DIAGNOSIS — I509 Heart failure, unspecified: Secondary | ICD-10-CM | POA: Diagnosis not present

## 2023-08-13 DIAGNOSIS — R627 Adult failure to thrive: Secondary | ICD-10-CM | POA: Diagnosis not present

## 2023-08-13 DIAGNOSIS — I214 Non-ST elevation (NSTEMI) myocardial infarction: Secondary | ICD-10-CM | POA: Diagnosis not present

## 2023-08-13 DIAGNOSIS — J9611 Chronic respiratory failure with hypoxia: Secondary | ICD-10-CM | POA: Diagnosis not present

## 2023-08-13 DIAGNOSIS — R451 Restlessness and agitation: Secondary | ICD-10-CM | POA: Diagnosis not present

## 2023-08-14 DIAGNOSIS — I214 Non-ST elevation (NSTEMI) myocardial infarction: Secondary | ICD-10-CM | POA: Diagnosis not present

## 2023-08-14 DIAGNOSIS — R1312 Dysphagia, oropharyngeal phase: Secondary | ICD-10-CM | POA: Diagnosis not present

## 2023-08-14 DIAGNOSIS — M6281 Muscle weakness (generalized): Secondary | ICD-10-CM | POA: Diagnosis not present

## 2023-08-14 DIAGNOSIS — I509 Heart failure, unspecified: Secondary | ICD-10-CM | POA: Diagnosis not present

## 2023-08-14 DIAGNOSIS — I10 Essential (primary) hypertension: Secondary | ICD-10-CM | POA: Diagnosis not present

## 2023-08-17 DIAGNOSIS — M6281 Muscle weakness (generalized): Secondary | ICD-10-CM | POA: Diagnosis not present

## 2023-08-17 DIAGNOSIS — I10 Essential (primary) hypertension: Secondary | ICD-10-CM | POA: Diagnosis not present

## 2023-08-17 DIAGNOSIS — I509 Heart failure, unspecified: Secondary | ICD-10-CM | POA: Diagnosis not present

## 2023-08-17 DIAGNOSIS — R1312 Dysphagia, oropharyngeal phase: Secondary | ICD-10-CM | POA: Diagnosis not present

## 2023-08-17 DIAGNOSIS — I214 Non-ST elevation (NSTEMI) myocardial infarction: Secondary | ICD-10-CM | POA: Diagnosis not present

## 2023-08-18 ENCOUNTER — Telehealth: Payer: Self-pay | Admitting: Neurology

## 2023-08-18 ENCOUNTER — Ambulatory Visit: Payer: 59 | Admitting: Neurology

## 2023-08-18 DIAGNOSIS — I214 Non-ST elevation (NSTEMI) myocardial infarction: Secondary | ICD-10-CM | POA: Diagnosis not present

## 2023-08-18 DIAGNOSIS — I509 Heart failure, unspecified: Secondary | ICD-10-CM | POA: Diagnosis not present

## 2023-08-18 DIAGNOSIS — R1312 Dysphagia, oropharyngeal phase: Secondary | ICD-10-CM | POA: Diagnosis not present

## 2023-08-18 DIAGNOSIS — M6281 Muscle weakness (generalized): Secondary | ICD-10-CM | POA: Diagnosis not present

## 2023-08-18 DIAGNOSIS — I10 Essential (primary) hypertension: Secondary | ICD-10-CM | POA: Diagnosis not present

## 2023-08-18 NOTE — Telephone Encounter (Signed)
 [facility] states pt sick, she will not make appointment

## 2023-08-19 DIAGNOSIS — I509 Heart failure, unspecified: Secondary | ICD-10-CM | POA: Diagnosis not present

## 2023-08-19 DIAGNOSIS — I10 Essential (primary) hypertension: Secondary | ICD-10-CM | POA: Diagnosis not present

## 2023-08-19 DIAGNOSIS — R1312 Dysphagia, oropharyngeal phase: Secondary | ICD-10-CM | POA: Diagnosis not present

## 2023-08-19 DIAGNOSIS — I214 Non-ST elevation (NSTEMI) myocardial infarction: Secondary | ICD-10-CM | POA: Diagnosis not present

## 2023-08-19 DIAGNOSIS — M6281 Muscle weakness (generalized): Secondary | ICD-10-CM | POA: Diagnosis not present

## 2023-08-20 DIAGNOSIS — M6281 Muscle weakness (generalized): Secondary | ICD-10-CM | POA: Diagnosis not present

## 2023-08-20 DIAGNOSIS — I10 Essential (primary) hypertension: Secondary | ICD-10-CM | POA: Diagnosis not present

## 2023-08-20 DIAGNOSIS — R1312 Dysphagia, oropharyngeal phase: Secondary | ICD-10-CM | POA: Diagnosis not present

## 2023-08-20 DIAGNOSIS — I509 Heart failure, unspecified: Secondary | ICD-10-CM | POA: Diagnosis not present

## 2023-08-20 DIAGNOSIS — I214 Non-ST elevation (NSTEMI) myocardial infarction: Secondary | ICD-10-CM | POA: Diagnosis not present

## 2023-08-21 NOTE — Progress Notes (Signed)
Remote pacemaker transmission.   

## 2023-09-10 DIAGNOSIS — Z515 Encounter for palliative care: Secondary | ICD-10-CM | POA: Diagnosis not present

## 2023-09-11 NOTE — Progress Notes (Signed)
Cardiology Office Note:  .   Date:  09/21/2023  ID:  [name], DOB [birth year], MRN [id number] PCP: [name], MD  [facility] Providers Cardiologist:  Nona Dell, MD Electrophysiologist:  Lewayne Bunting, MD    History of Present Illness: .   [name] is a 88 y.o. female  with a hx of CAD s/p PCI x2 to RCA shockwave lithotripsy, PAD (not on a/c due to falls/refusal), sinus node dysfunction, tachybradycardia syndrome s/p PPM followed by EP, mild to mod MR, aortic dilation, COPD, HTN, HLD, h/o pericardial effusion, GERD, hiatal hernia, IPMN, dementia, anemia   CABG was deferred due to being a poor surgical candidate. Patient underwent complex PCI and angioplasty of the RCA with ISR in October 2022. She had residual critical left Cx and LAD disease that was medically managed. She has been followed by Dr. Ladona Ridgel for sinus node dysfunction s/p Medtronic PPM due to underlying tachybradycardia syndrome. Currently, she is not on a/c for her PAF due to refusal/fall . In December 2023 patient had a generator change out and normal device check in June. She was admitted in April 2024 with syncope not felt to be cardiac in nature. It seemed to be consistent with acute metabolic encephalopathy with no stroke. Echo showed LVEF 55-60%, normal RSF, mil to mod MR.    The patient was admitted to [facility] 03/2023 with NSTEMI with troponin up to 200s. She was started on Brilinta, given ASA load and started on IV heparin.  Patient diuresed 8 L.  Repeat echo showed EF of 55%, mildly reduced RV function, no wall motion abnormalities.  No heart cath was pursued due to dementia, age, and other comorbidities. EKG showed SR, home amiodarone was resumed.    Discharged 07/2023 with CHF admission in the setting of covid. In ED with a fall 08/10/23 and K 2.5  Patient comes in from a facility with an aide. She's very HOH. No chest pain, dyspnea, edema, palpitations or cardiac complaints.     ROS:    Studies  Reviewed: Marland Kitchen         Prior CV Studies: ECHO COMPLETE WO IMAGING ENHANCING AGENT 06/09/2023  Narrative ECHOCARDIOGRAM REPORT    Patient Name:   [name] Date of Exam: 06/09/2023 Medical Rec #:  [id number]    Height:       65.5 in Accession #:    [id number]   Weight:       123.0 lb Date of Birth:  01/30/1936    BSA:          1.618 m Patient Age:    87 years     BP:           124/62 mmHg Patient Gender: F            HR:           88 bpm. Exam Location:  [place]  Procedure: 2D Echo, 3D Echo, Color Doppler and Cardiac Doppler  Indications:    I48.0 Paroxysmal atrial fibrillation  History:        Patient has prior history of Echocardiogram examinations, most recent 03/12/2023. CHF, CAD and Previous Myocardial Infarction, Pacemaker and Abnormal ECG, COPD, Arrythmias:Atrial Fibrillation; Risk Factors:Former Smoker, Hypertension and Dyslipidemia.  Sonographer:    Dominica Severin RCS, RVS Referring Phys: [id number] SAMUEL G MCDOWELL   Sonographer Comments: Global longitudinal strain was attempted. IMPRESSIONS   1. Left ventricular ejection fraction, by estimation, is 60 to 65%. The left ventricle has  normal function. The left ventricle has no regional wall motion abnormalities. There is mild concentric left ventricular hypertrophy. Left ventricular diastolic parameters are consistent with Grade II diastolic dysfunction (pseudonormalization). 2. Right ventricular systolic function is normal. The right ventricular size is normal. There is mildly elevated pulmonary artery systolic pressure. The estimated right ventricular systolic pressure is 36.6 mmHg. 3. Left atrial size was severely dilated. 4. The mitral valve is degenerative. Mild to moderate mitral valve regurgitation. No evidence of mitral stenosis. The mean mitral valve gradient is 2.0 mmHg. Moderate mitral annular calcification. 5. The aortic valve is tricuspid. Aortic valve regurgitation is mild. Aortic valve sclerosis/calcification is  present, without any evidence of aortic stenosis. Aortic valve mean gradient measures 5.0 mmHg. 6. The inferior vena cava is normal in size with greater than 50% respiratory variability, suggesting right atrial pressure of 3 mmHg.  Comparison(s): Prior images reviewed side by side. LVEF normal range at 60-65%. Mildly elevated RVSP. Mild to moderate mitral regurgitation.  FINDINGS Left Ventricle: Left ventricular ejection fraction, by estimation, is 60 to 65%. The left ventricle has normal function. The left ventricle has no regional wall motion abnormalities. Global longitudinal strain performed but not reported based on interpreter judgement due to suboptimal tracking. The left ventricular internal cavity size was normal in size. There is mild concentric left ventricular hypertrophy. Left ventricular diastolic parameters are consistent with Grade II diastolic dysfunction (pseudonormalization).  Right Ventricle: The right ventricular size is normal. No increase in right ventricular wall thickness. Right ventricular systolic function is normal. There is mildly elevated pulmonary artery systolic pressure. The tricuspid regurgitant velocity is 2.90 m/s, and with an assumed right atrial pressure of 3 mmHg, the estimated right ventricular systolic pressure is 36.6 mmHg.  Left Atrium: Left atrial size was severely dilated.  Right Atrium: Right atrial size was normal in size.  Pericardium: There is no evidence of pericardial effusion.  Mitral Valve: The mitral valve is degenerative in appearance. There is mild thickening of the mitral valve leaflet(s). Moderate mitral annular calcification. Mild to moderate mitral valve regurgitation. No evidence of mitral valve stenosis. MV peak gradient, 4.1 mmHg. The mean mitral valve gradient is 2.0 mmHg.  Tricuspid Valve: The tricuspid valve is grossly normal. Tricuspid valve regurgitation is mild.  Aortic Valve: The aortic valve is tricuspid. There is mild  aortic valve annular calcification. Aortic valve regurgitation is mild. Aortic regurgitation PHT measures 473 msec. Aortic valve sclerosis/calcification is present, without any evidence of aortic stenosis. Aortic valve mean gradient measures 5.0 mmHg. Aortic valve peak gradient measures 9.1 mmHg. Aortic valve area, by VTI measures 2.96 cm.  Pulmonic Valve: The pulmonic valve was grossly normal. Pulmonic valve regurgitation is trivial.  Aorta: The aortic root and ascending aorta are structurally normal, with no evidence of dilitation.  Venous: The inferior vena cava is normal in size with greater than 50% respiratory variability, suggesting right atrial pressure of 3 mmHg.  IAS/Shunts: No atrial level shunt detected by color flow Doppler.  Additional Comments: A device lead is visualized.   LEFT VENTRICLE PLAX 2D LVIDd:         4.70 cm     Diastology LVIDs:         3.30 cm     LV e' medial:    4.90 cm/s LV PW:         1.20 cm     LV E/e' medial:  18.4 LV IVS:        1.20 cm  LV e' lateral:   8.27 cm/s LVOT diam:     2.20 cm     LV E/e' lateral: 10.9 LV SV:         91 LV SV Index:   56 LVOT Area:     3.80 cm  3D Volume EF: LV Volumes (MOD)           3D EF:        65 % LV vol d, MOD A2C: 70.9 ml LV EDV:       100 ml LV vol d, MOD A4C: 66.6 ml LV ESV:       35 ml LV vol s, MOD A2C: 19.0 ml LV SV:        64 ml LV vol s, MOD A4C: 25.6 ml LV SV MOD A2C:     51.9 ml LV SV MOD A4C:     66.6 ml LV SV MOD BP:      47.9 ml  RIGHT VENTRICLE RV Basal diam:  4.90 cm RV Mid diam:    3.20 cm RV S prime:     10.20 cm/s TAPSE (M-mode): 2.1 cm  LEFT ATRIUM              Index        RIGHT ATRIUM           Index LA diam:        3.70 cm  2.29 cm/m   RA Area:     17.20 cm LA Vol (A2C):   96.1 ml  59.39 ml/m  RA Volume:   47.20 ml  29.17 ml/m LA Vol (A4C):   94.7 ml  58.53 ml/m LA Biplane Vol: 104.0 ml 64.27 ml/m AORTIC VALVE                    PULMONIC VALVE AV Area (Vmax):    2.46  cm     PV Vmax:          0.89 m/s AV Area (Vmean):   3.08 cm     PV Peak grad:     3.2 mmHg AV Area (VTI):     2.96 cm     PR End Diast Vel: 5.95 msec AV Vmax:           151.00 cm/s AV Vmean:          99.700 cm/s AV VTI:            0.307 m AV Peak Grad:      9.1 mmHg AV Mean Grad:      5.0 mmHg LVOT Vmax:         97.90 cm/s LVOT Vmean:        80.900 cm/s LVOT VTI:          0.239 m LVOT/AV VTI ratio: 0.78 AI PHT:            473 msec  AORTA Ao Root diam: 3.50 cm Ao Asc diam:  3.20 cm  MITRAL VALVE               TRICUSPID VALVE MV Area (PHT): 2.90 cm    TR Peak grad:   33.6 mmHg MV Area VTI:   2.84 cm    TR Vmax:        290.00 cm/s MV Peak grad:  4.1 mmHg MV Mean grad:  2.0 mmHg    SHUNTS MV Vmax:       1.01 m/s    Systemic VTI:  0.24  m MV Vmean:      57.1 cm/s   Systemic Diam: 2.20 cm MV Decel Time: 262 msec MR Peak grad: 108.6 mmHg MR Mean grad: 73.0 mmHg MR Vmax:      521.00 cm/s MR Vmean:     402.0 cm/s MV E velocity: 90.30 cm/s MV A velocity: 60.20 cm/s MV E/A ratio:  1.50  Nona Dell MD Electronically signed by Nona Dell MD Signature Date/Time: 06/09/2023/11:35:16 AM    Final     Echo 03/2023 1. Left ventricular ejection fraction, by estimation, is 55 to 60%. The  left ventricle has normal function. The left ventricle has no regional  wall motion abnormalities. There is mild asymmetric left ventricular  hypertrophy of the basal-septal segment.  Left ventricular diastolic parameters are indeterminate.   2. Right ventricular systolic function is mildly reduced. The right  ventricular size is normal. There is mildly elevated pulmonary artery  systolic pressure. The estimated right ventricular systolic pressure is  38.8 mmHg.   3. Left atrial size was severely dilated.   4. Right atrial size was moderately dilated.   5. The mitral valve is degenerative. Moderate mitral valve regurgitation.  No evidence of mitral stenosis. Severe mitral annular  calcification.   6. The aortic valve is abnormal. There is mild calcification of the  aortic valve. There is moderate thickening of the aortic valve. Aortic  valve regurgitation is mild. Aortic valve sclerosis/calcification is  present, without any evidence of aortic  stenosis.   7. The inferior vena cava is normal in size with greater than 50%  respiratory variability, suggesting right atrial pressure of 3 mmHg.   Risk Assessment/Calculations:    CHA2DS2-VASc Score = 5   This indicates a 7.2% annual risk of stroke. The patient's score is based upon: CHF History: 1 HTN History: 0 Diabetes History: 0 Stroke History: 0 Vascular Disease History: 1 Age Score: 2 Gender Score: 1            Physical Exam:   VS:  BP 118/64 (BP Location: Left Arm, Patient Position: Sitting, Cuff Size: Normal)   Pulse 88   Ht 5' 5.5" (1.664 m)   Wt 117 lb 6.4 oz (53.3 kg)   SpO2 98%   BMI 19.24 kg/m    Wt Readings from Last 3 Encounters:  09/21/23 117 lb 6.4 oz (53.3 kg)  08/06/23 128 lb 8.5 oz (58.3 kg)  07/07/23 128 lb 8.5 oz (58.3 kg)    GEN: Thin, elderly, shaky, in no acute distress NECK: No JVD; No carotid bruits CARDIAC:  RRR, no murmurs, rubs, gallops RESPIRATORY:  Clear to auscultation without rales, wheezing or rhonchi  ABDOMEN: Soft, non-tender, non-distended EXTREMITIES:  No edema; No deformity   ASSESSMENT AND PLAN: .    CAD with prior complex PCI with shockwave lithotripsy and balloon angioplasty to RCA with ISA. Residual critical left Cx and LAD disease, not felt to be surgical candidate.  NSTEMI 03/2023 was medically managed with 48 hours of IV heparin. Echo showed EF of 55 to 60%, mild LVH, mild reduced RV function, moderate MR mild AI. Ranexa was stopped for prolonged Qtc. -no chest pain   Chronic HFpEF Moderate MR Echo  LVEF 55 to 60%, no wall motion abnormalities, mild LVH, mildly reduced RV function, mildly elevated pulmonary systolic pressure, severely dilated left  atrium, moderately dilated right atrium, moderate MR, mild AI. Patient on lasix 40 mg am 20 mg pm. She appears euvolemic possibly on the dry side on exam.  K 2.5 in ED 08/10/23.  I will check a BMET today.  SGLT2 inhibitor stopped by either palliative care or PCP prior to hospitalization.  Continue beta-blocker.   Paroxysmal A-fib Patient is not on anticoagulation due to falls and confusion.  Continue Toprol for rate control and amiodarone 200 mg daily.   Tachybradycardia syndrome status post pacemaker Patient is monitored by EP.  Continue annual follow-up.           Dispo: f/u in 6 months  Signed, Jacolyn Reedy, PA-C

## 2023-09-16 DIAGNOSIS — R451 Restlessness and agitation: Secondary | ICD-10-CM | POA: Diagnosis not present

## 2023-09-21 ENCOUNTER — Encounter: Payer: Self-pay | Admitting: Physician Assistant

## 2023-09-21 ENCOUNTER — Ambulatory Visit: Payer: 59 | Attending: Physician Assistant | Admitting: Physician Assistant

## 2023-09-21 ENCOUNTER — Other Ambulatory Visit (HOSPITAL_COMMUNITY)
Admission: RE | Admit: 2023-09-21 | Discharge: 2023-09-21 | Disposition: A | Discharge status: Home / Self Care | Payer: 59 | Source: Ambulatory Visit | Attending: Physician Assistant | Admitting: Physician Assistant

## 2023-09-21 VITALS — BP 118/64 | HR 88 | Ht 65.5 in | Wt 117.4 lb

## 2023-09-21 DIAGNOSIS — E876 Hypokalemia: Secondary | ICD-10-CM | POA: Diagnosis not present

## 2023-09-21 DIAGNOSIS — I495 Sick sinus syndrome: Secondary | ICD-10-CM | POA: Diagnosis not present

## 2023-09-21 DIAGNOSIS — I482 Chronic atrial fibrillation, unspecified: Secondary | ICD-10-CM | POA: Diagnosis not present

## 2023-09-21 DIAGNOSIS — Z79899 Other long term (current) drug therapy: Secondary | ICD-10-CM

## 2023-09-21 DIAGNOSIS — I5022 Chronic systolic (congestive) heart failure: Secondary | ICD-10-CM | POA: Diagnosis not present

## 2023-09-21 DIAGNOSIS — I5032 Chronic diastolic (congestive) heart failure: Secondary | ICD-10-CM | POA: Diagnosis not present

## 2023-09-21 DIAGNOSIS — I10 Essential (primary) hypertension: Secondary | ICD-10-CM | POA: Diagnosis not present

## 2023-09-21 DIAGNOSIS — I2510 Atherosclerotic heart disease of native coronary artery without angina pectoris: Secondary | ICD-10-CM | POA: Diagnosis not present

## 2023-09-21 DIAGNOSIS — I480 Paroxysmal atrial fibrillation: Secondary | ICD-10-CM

## 2023-09-21 LAB — BASIC METABOLIC PANEL
Anion gap: 11 (ref 5–15)
BUN: 21 mg/dL (ref 8–23)
CO2: 29 mmol/L (ref 22–32)
Calcium: 8.6 mg/dL — ABNORMAL LOW (ref 8.9–10.3)
Chloride: 100 mmol/L (ref 98–111)
Creatinine, Ser: 0.99 mg/dL (ref 0.44–1.00)
GFR, Estimated: 56 mL/min — ABNORMAL LOW (ref >60)
Glucose, Bld: 97 mg/dL (ref 70–99)
Potassium: 3.6 mmol/L (ref 3.5–5.1)
Sodium: 140 mmol/L (ref 135–145)

## 2023-09-21 NOTE — Patient Instructions (Signed)
Medication Instructions:  Your physician recommends that you continue on your current medications as directed. Please refer to the Current Medication list given to you today.  Labwork: BMET today at [facility]  Testing/Procedures: none  Follow-Up: Your physician recommends that you schedule a follow-up appointment in: 6 months with Dr. Diona Browner  Any Other Special Instructions Will Be Listed Below (If Applicable).  If you need a refill on your cardiac medications before your next appointment, please call your pharmacy.

## 2023-09-25 DIAGNOSIS — N390 Urinary tract infection, site not specified: Secondary | ICD-10-CM | POA: Diagnosis not present

## 2023-10-01 DIAGNOSIS — R059 Cough, unspecified: Secondary | ICD-10-CM | POA: Diagnosis not present

## 2023-10-03 DIAGNOSIS — R059 Cough, unspecified: Secondary | ICD-10-CM | POA: Diagnosis not present

## 2023-10-19 ENCOUNTER — Ambulatory Visit (INDEPENDENT_AMBULATORY_CARE_PROVIDER_SITE_OTHER): Payer: 59 | Admitting: Gastroenterology

## 2023-10-19 ENCOUNTER — Encounter (INDEPENDENT_AMBULATORY_CARE_PROVIDER_SITE_OTHER): Payer: Self-pay | Admitting: Gastroenterology

## 2023-10-19 VITALS — BP 127/76 | HR 102 | Temp 97.1°F | Ht 65.5 in | Wt 109.5 lb

## 2023-10-19 DIAGNOSIS — R1010 Upper abdominal pain, unspecified: Secondary | ICD-10-CM

## 2023-10-19 DIAGNOSIS — K862 Cyst of pancreas: Secondary | ICD-10-CM | POA: Diagnosis not present

## 2023-10-19 DIAGNOSIS — K5900 Constipation, unspecified: Secondary | ICD-10-CM

## 2023-10-19 DIAGNOSIS — R110 Nausea: Secondary | ICD-10-CM

## 2023-10-19 DIAGNOSIS — K219 Gastro-esophageal reflux disease without esophagitis: Secondary | ICD-10-CM | POA: Diagnosis not present

## 2023-10-19 DIAGNOSIS — R1084 Generalized abdominal pain: Secondary | ICD-10-CM

## 2023-10-19 NOTE — Progress Notes (Signed)
 Referring Provider: [name], MD Primary Care Physician:  [name], MD Primary GI Physician: Dr. Levon Hedger   Chief Complaint  Patient presents with   Follow-up    Patient here today for a follow up. Patient unable to tell me if she is having any current gi issues.   HPI:   [name] is a 88 y.o. female with past medical history of Alzheimer's dementia, COPD, coronary artery disease status post stent placement, [name] syndrome, hypertension, hyperlipidemia, mixed type IPMN, atrial fibrillation, anxiety   Patient presenting today for:  Follow up of mixed branch duct/main duct IPMN Chronic recurrent abdominal pain  Constipation   Patient last seen September 2024, at that time no staff from nursing, present with the patient, she reported some constipation since her NSTEMI in August.  She had a CT scan on 04/11/2023 which did not show any new abnormalities in her pancreatic ductal dilation or any other intra-abdominal acute abnormalities.  Taking Senokot once a day and MiraLAX every other day, with a BM every 3 days. She endorsed daily nausa, some blood in her stools when straining and some occasional heartburn, no on a PPI.   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   patient states that she continues to have some chronic abdominal pain across her upper abdomen. She also has chronic nausea which she takes zofran for. Occasionally will vomit. She notes that she sometimes wakes up with nausea. Appears to be taking zofran which she thinks helps.    She has some constipation. She cannot tell me how often she is having a BM, notes it is  not everyday. She sees blood on toilet tissue at times of straining to defecate. She cannot tell me how often this occurs. She reports that she is taking fiber for her constipation though MAR from nursing home has dulcolax PRN,  senna 1 daily and miralax 17g BID listed.   She has heartburn almost daily, taking protonix 20mg  daily.   Pertinent history: Patient has history of an enlarging pancreatic cystic lesion with associated abdominal pain.  Due to this she was referred for endoscopic ultrasound.   -EGD/EUS with [name] on 02/24/2022 with findings described below: A non-obstructing Schatzki ring was found at the gastroesophageal junction. Biopsies were taken with a cold forceps for disruption purposes. No other gross lesions were noted in the entire esophagus. After completion of the EUS, a guidewire was placed and the scope was withdrawn. Dilation was performed with a Savary dilator with mild resistance at 18 mm. The dilation site was examined following endoscope reinsertion and showed mild mucosal disruption/wrent just below the UES, mild improvement in luminal narrowing and no perforation. A 2 cm hiatal hernia was present. Multiple dispersed small erosions with no bleeding and no stigmata of recent bleeding were found in the entire examined stomach. Biopsies were taken with a cold forceps for histology and Helicobacter pylori testing. No other gross lesions were noted in the duodenal bulb, in the first portion of the duodenum and in the second portion of the duodenum. A fishmouth deformity was found at the major papilla.   -EUS part:  Anechoic lesions suggestive of three cysts were identified in the pancreatic head (1) and genu of the pancreas (2). The first cyst measured 19 mm by 10 mm in the HOP. The second cyst measured 7 mm by 6 mm in the NOP. The third cyst measured 9 mm by 7 mm in the NOP. There was no associated masses. Diagnostic needle aspiration for fluid was performed. Color  Doppler imaging was utilized prior to needle puncture to confirm a lack of significant vascular structures within the needle path. One pass was made with the Expect 22 gauge needle using a transduodenal approach to the HOP cyst. A stylet was used. The amount of fluid collected was ~1 mL. The fluid was clear and white. Sample was sent for amylase concentration, cytology and CEA. Pancreatic parenchymal abnormalities were noted in the entire pancreas. These consisted of lobularity without honeycombing, cysts and hyperechoic strands. The pancreatic duct had a prominently branched endosonographic appearance and had a tortuous/ectatic appearance as well as some prominence in the pancreatic head (2.7 -> 4.0 mm), genu of the pancreas (3.0 mm), body of the pancreas (3.3 mm) and tail of the pancreas (2.4 mm).  There was no sign of significant endosonographic abnormality in the common bile duct (4.0 mm)and in the common hepatic duct (7.5 mm). Endosonographic imaging of the ampulla showed no intramural (subepithelial) lesion. No malignant-appearing lymph nodes were visualized in the celiac region (level 20), peripancreatic region and porta hepatis region. Endosonographic imaging in the visualized portion of the liver showed no mass.   NOTE: could not get the Linear EUS to pass into the short position from D2 and thus could not evaluate the Uncinate Process of the Pancreas.   CEA 22 , amylase 21,144, cytology showed acellular specimen   Her case was discussed at multidisciplinary tumor board.  It was considered that she was presenting changes concerning for a mixed branch duct/main duct IPMN.  It was considered that given her medical comorbidities she would not be the best surgical candidate but she will continue monitoring.  It was decided that she should have a repeat CT pancreas protocol in 6 months.  Depending on the findings on imaging, may need to discuss again the benefits versus risk of undergoing surgical  resection.    -CT of the abdomen with and without IV contrast on 05/01/2022 which showed decrease size of the cystic lesions of the pancreatic head and uncinate process measuring up to 15 mm.  She was advised to have a repeat CT pancreas protocol in 6 months.    -CT pancreas protocol on 10/07/2022 which showed stable size of cystic lesions measuring up to 17 mm in the central pancreatic head proximity to the portal confluence.  Compared to the previous study, the largest lesion was 2 mm larger.   Colonoscopy: 2011 hemorrhoids  Filed Weights   10/19/23 1054  Weight: 109 lb 8 oz (49.7 kg)     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    Essential hypertension    GERD (gastroesophageal reflux disease)    H/O hiatal hernia    Headache(784.0)    HOH (hard of hearing)    Hyperlipidemia  Neuromuscular disorder (HCC)    Tremors   Pericardial effusion    Hemorrhagic    Presence of permanent cardiac pacemaker    Tachycardia-bradycardia syndrome [facility])    s/p Medtronic Adapta L dual chamber device  5/10    Past Surgical History:  Procedure Laterality Date   APPENDECTOMY     BIOPSY  02/24/2022   Procedure: BIOPSY;  Surgeon: Meridee Score Netty Starring., MD;  Location: [facility] ENDOSCOPY;  Service: Gastroenterology;;   CARDIAC CATHETERIZATION  2010   stents x2.   CATARACT EXTRACTION     CHOLECYSTECTOMY     COLONOSCOPY  2011   COLONOSCOPY N/A 10/19/2014   Procedure: COLONOSCOPY;  Surgeon: Malissa Hippo, MD;  Location: [facility];  Service: Endoscopy;  Laterality: N/A;  1030   CORONARY STENT INTERVENTION N/A 05/28/2021   Procedure: CORONARY STENT INTERVENTION;  Surgeon: Marykay Lex, MD;  Location: [facility] INVASIVE CV LAB;  Service:  Cardiovascular;  Laterality: N/A;   CORONARY ULTRASOUND/IVUS N/A 05/28/2021   Procedure: Intravascular Ultrasound/IVUS;  Surgeon: Marykay Lex, MD;  Location: [facility] INVASIVE CV LAB;  Service: Cardiovascular;  Laterality: N/A;   ESOPHAGEAL DILATION N/A 05/02/2020   Procedure: ESOPHAGEAL DILATION;  Surgeon: Malissa Hippo, MD;  [facility];  Service: Gastroenterology;  Laterality: N/A;   ESOPHAGOGASTRODUODENOSCOPY N/A 10/26/2014   Procedure: ESOPHAGOGASTRODUODENOSCOPY (EGD);  Surgeon: Malissa Hippo, MD;  [facility]: [facility];  Service: Endoscopy;  Laterality: N/A;  230 - Dr. has lunch and learn   ESOPHAGOGASTRODUODENOSCOPY N/A 02/24/2022   Procedure: ESOPHAGOGASTRODUODENOSCOPY (EGD);  Surgeon: Lemar Lofty., MD;  Location: [facility];  Service: Gastroenterology;  Laterality: N/A;   ESOPHAGOGASTRODUODENOSCOPY (EGD) WITH PROPOFOL N/A 05/02/2020   Procedure: ESOPHAGOGASTRODUODENOSCOPY (EGD) WITH PROPOFOL;  Surgeon: Malissa Hippo, MD;  [facility];  Service: Gastroenterology;  Laterality: N/A;  250   Esophagogastroduodenoscopy with esophageal dilation  2004, 2006, 2007   EUS N/A 02/24/2022   Procedure: UPPER ENDOSCOPIC ULTRASOUND (EUS) LINEAR;  Surgeon: Lemar Lofty., MD;  Location: [facility];  Service: Gastroenterology;  Laterality: N/A;   FINE NEEDLE ASPIRATION  02/24/2022   Procedure: FINE NEEDLE ASPIRATION;  Surgeon: Meridee Score Netty Starring., MD;  Location: [facility] ENDOSCOPY;  Service: Gastroenterology;;  red path sent   GIVENS CAPSULE STUDY N/A 10/31/2014   Procedure: GIVENS CAPSULE STUDY;  Surgeon: Malissa Hippo, MD;  [facility];  Service: Endoscopy;  Laterality: N/A;  730 -- pacemaker--needs monitoring--outpatient bed   INSERT / REPLACE / REMOVE PACEMAKER  2010   LEFT HEART CATH AND CORONARY ANGIOGRAPHY N/A 03/21/2021   Procedure: LEFT HEART CATH AND CORONARY ANGIOGRAPHY;  Surgeon: Lyn Records, MD;  Location: [facility] INVASIVE CV LAB;   Service: Cardiovascular;  Laterality: N/A;   LEFT HEART CATHETERIZATION WITH CORONARY ANGIOGRAM N/A 11/17/2011   Procedure: LEFT HEART CATHETERIZATION WITH CORONARY ANGIOGRAM;  Surgeon: Tonny Bollman, MD;  Location: [facility] CATH LAB;  Service: Cardiovascular;  Laterality: N/A;   LEFT HEART CATHETERIZATION WITH CORONARY ANGIOGRAM N/A 09/26/2014   Procedure: LEFT HEART CATHETERIZATION WITH CORONARY ANGIOGRAM;  Surgeon: Marykay Lex, MD;  Location: [facility] CATH LAB;  Service: Cardiovascular;  Laterality: N/A;   PPM GENERATOR CHANGEOUT N/A 07/16/2022   Procedure: PPM GENERATOR CHANGEOUT;  Surgeon: Marinus Maw, MD;  Location: [facility] INVASIVE CV LAB;  Service: Cardiovascular;  Laterality: N/A;   Right rotator cuff repair     SAVORY DILATION N/A 02/24/2022   Procedure: SAVORY DILATION;  Surgeon: Lemar Lofty., MD;  Location: [facility];  Service: Gastroenterology;  Laterality: N/A;   SHOULDER ACROMIOPLASTY Right 05/30/2015   Procedure: RIGHT SHOULDER ACROMIOPLASTY;  Surgeon: Vickki Hearing, MD;  Location: [facility];  Service: Orthopedics;  Laterality: Right;   SHOULDER OPEN ROTATOR CUFF REPAIR Right 05/30/2015   Procedure: OPEN ROTATOR CUFF REPAIR RIGHT SHOULDER;  Surgeon: Vickki Hearing, MD;  Location: [facility];  Service: Orthopedics;  Laterality: Right;   SHOULDER OPEN ROTATOR CUFF REPAIR Left 10/22/2016   Procedure: ROTATOR CUFF REPAIR SHOULDER OPEN;  Surgeon: Vickki Hearing, MD;  Location: [facility];  Service: Orthopedics;  Laterality: Left;   Subxiphoid pericardial window  11/10   VAGINAL HYSTERECTOMY      Current Outpatient Medications  Medication Sig Dispense Refill   acetaminophen (TYLENOL) 325 MG tablet Take 2 tablets (650 mg total) by mouth every 6 (six) hours as needed for mild pain (or Fever >/= 101). (Patient taking differently: Take 650 mg by mouth every 4 (four) hours as needed for mild pain (pain score 1-3) (or Fever >/= 101).) 100 tablet 1   acetaminophen (TYLENOL) 500 MG  tablet Take 1,000 mg by mouth every 6 (six) hours as needed for mild pain (pain score 1-3).     albuterol (PROVENTIL) (2.5 MG/3ML) 0.083% nebulizer solution Take 3 mLs (2.5 mg total) by nebulization every 4 (four) hours as needed for wheezing or shortness of breath. 75 mL 2   albuterol (VENTOLIN HFA) 108 (90 Base) MCG/ACT inhaler Inhale 1 puff into the lungs every 4 (four) hours as needed for wheezing or shortness of breath. 8 g 1   amiodarone (PACERONE) 200 MG tablet Take 1 tablet (200 mg total) by mouth daily.     aspirin EC 81 MG tablet Take 1 tablet (81 mg total) by mouth daily with breakfast. Swallow whole. 30 tablet 12   atorvastatin (LIPITOR) 80 MG tablet Take 1 tablet (80 mg total) by mouth daily. 90 tablet 1   bisacodyl (DULCOLAX) 10 MG suppository Place 10 mg rectally daily as needed for moderate constipation.     Cyanocobalamin (VITAMIN B-12) 5000 MCG SUBL Take 5,000 mcg by mouth daily.     dicyclomine (BENTYL) 10 MG capsule Take 1 capsule (10 mg total) by mouth every 12 (twelve) hours as needed for spasms (abdominal pain). 90 capsule 1   divalproex (DEPAKOTE) 250 MG DR tablet Take 250 mg by mouth 2 (two) times daily.     folic acid (FOLVITE) 1 MG tablet Take 1 tablet (1 mg total) by mouth daily.     furosemide (LASIX) 20 MG tablet Take 1 tablet (20 mg total) by mouth every evening. 30 tablet 3   furosemide (LASIX) 40 MG tablet Take 1 tablet (40 mg total) by mouth daily. 30 tablet 3   guaifenesin (ROBITUSSIN) 100 MG/5ML syrup Take 200 mg by mouth every 6 (six) hours as needed for cough.     isosorbide mononitrate (IMDUR) 30 MG 24 hr tablet Take 1 tablet (30 mg total) by mouth daily.     LORazepam (ATIVAN) 1 MG tablet Take 1 mg by mouth every 8 (eight) hours.     melatonin 5 MG TABS Take 5 mg by mouth at bedtime.     metoprolol succinate (TOPROL-XL) 50 MG 24 hr tablet Take 1 tablet (50 mg total) by mouth 2 (two) times daily. Take with or immediately following a meal.     nitroGLYCERIN  (NITROLINGUAL) 0.4 MG/SPRAY spray Place 1 spray under the tongue every 5 (five) minutes x 3 doses as needed for  chest pain.     Nutritional Supplements (NUTRITIONAL SUPPLEMENT PO) Take 237 mLs by mouth in the morning, at noon, and at bedtime.     ondansetron (ZOFRAN) 4 MG tablet Take 4 mg by mouth every 6 (six) hours as needed for vomiting or nausea.     pantoprazole (PROTONIX) 20 MG tablet Take 1 tablet (20 mg total) by mouth daily. 90 tablet 3   PARoxetine (PAXIL) 10 MG tablet Take 20 mg by mouth daily.     polyethylene glycol (MIRALAX / GLYCOLAX) 17 g packet Take 17 g by mouth 2 (two) times daily.     potassium chloride SA (KLOR-CON M) 20 MEQ tablet Take 20 mEq by mouth daily.     ranolazine (RANEXA) 500 MG 12 hr tablet Take 1 tablet (500 mg total) by mouth 2 (two) times daily. 60 tablet 8   rOPINIRole (REQUIP) 0.5 MG tablet Take 0.5 mg by mouth daily.     senna-docusate (SENOKOT-S) 8.6-50 MG tablet Take 2 tablets by mouth at bedtime. 120 tablet 1   ticagrelor (BRILINTA) 60 MG TABS tablet Take 1 tablet (60 mg total) by mouth 2 (two) times daily. 60 tablet 5   vitamin E 180 MG (400 UNITS) capsule Take 400 Units by mouth daily.     No current facility-administered medications for this visit.    Allergies as of 10/19/2023 - Review Complete 10/19/2023  Allergen Reaction Noted   Amitriptyline hcl Other (See Comments) 06/05/2009   Plavix [clopidogrel bisulfate] Hives 08/30/2021   Sulfonamide derivatives Other (See Comments)     Social History   Socioeconomic History   Marital status: Widowed    Spouse name: Not on file   Number of children: 1   Years of education: 8   Highest education level: 8th grade  Occupational History   Occupation: Retired    Associate Professor: RETIRED  Tobacco Use   Smoking status: Former    Current packs/day: 0.00    Average packs/day: 2.0 packs/day for 10.0 years (20.0 ttl pk-yrs)    Types: Cigarettes    Start date: 05/24/1979    Quit date: 05/23/1989    Years  since quitting: 34.4    Passive exposure: Past   Smokeless tobacco: Never   Tobacco comments:    Verified by Granddaughter, [name]  Vaping Use   Vaping status: Never Used  Substance and Sexual Activity   Alcohol use: No    Alcohol/week: 0.0 standard drinks of alcohol   Drug use: No   Sexual activity: Not Currently    Partners: Male  Other Topics Concern   Not on file  Social History Narrative   She lives alone, has adopted daughter.   Social Drivers of Corporate investment banker Strain: Low Risk  (11/05/2022)   Overall Financial Resource Strain (CARDIA)    Difficulty of Paying Living Expenses: Not hard at all  Food Insecurity: No Food Insecurity (07/05/2023)   Hunger Vital Sign    Worried About Running Out of Food in the Last Year: Never true    Ran Out of Food in the Last Year: Never true  Transportation Needs: No Transportation Needs (07/05/2023)   PRAPARE - Administrator, Civil Service (Medical): No    Lack of Transportation (Non-Medical): No  Physical Activity: Inactive (11/05/2022)   Exercise Vital Sign    Days of Exercise per Week: 0 days    Minutes of Exercise per Session: 0 min  Stress: No Stress Concern Present (11/05/2022)  [employer] of Occupational Health - Occupational Stress Questionnaire    Feeling of Stress : Not at all  Social Connections: Moderately Integrated (11/05/2022)   Social Connection and Isolation Panel [NHANES]    Frequency of Communication with Friends and Family: More than three times a week    Frequency of Social Gatherings with Friends and Family: More than three times a week    Attends Religious Services: More than 4 times per year    Active Member of [employer] or Organizations: Yes    Attends Banker Meetings: Never    Marital Status: Widowed   Review of systems General: negative for malaise, night sweats, fever, chills, weight loss Neck: Negative for lumps, goiter, pain and significant neck swelling Resp:  Negative for cough, wheezing, dyspnea at rest CV: Negative for chest pain, leg swelling, palpitations, orthopnea GI: denies melena, vomiting, diarrhea, dysphagia, odyonophagia, early satiety or unintentional weight loss. +nausea +constipation +abdominal pain +toilet tissue hematochezia The remainder of the review of systems is noncontributory.  Physical Exam: BP (!) 151/81 (BP Location: Left Arm, Patient Position: Sitting, Cuff Size: Normal)   Pulse 100   Temp (!) 97.1 F (36.2 C) (Temporal)   Ht 5' 5.5" (1.664 m)   Wt 109 lb 8 oz (49.7 kg)   BMI 17.94 kg/m  General:   Alert and oriented x2-3, forgetful, No distress noted. Pleasant and cooperative. Sitting in wheelchair, she appears frail  Head:  Normocephalic and atraumatic. Eyes:  Conjuctiva clear without scleral icterus. Mouth:  Oral mucosa pink and moist. Good dentition. No lesions. Heart: Normal rate and rhythm, s1 and s2 heart sounds present.  Lungs: Clear lung sounds in all lobes. Respirations equal and unlabored. Abdomen:  +BS, soft, non-tender and non-distended. No rebound or guarding. No HSM or masses noted. Extremities:  Without edema. Neurologic:  Alert and oriented x2-3, no focal motor deficits noted Psych:  Alert and cooperative. Normal mood and affect.  Invalid input(s): "6 MONTHS"   ASSESSMENT: ATHALEE ESTERLINE is a 88 y.o. female presenting today for follow up of constipation, abdominal pain and GERD  History today is somewhat difficult to obtain due to patient's history of alzheimer's and no clinical staff member present with her.  Constipation:patient endorses constipation. Unclear how often she is having a BM. She endorses some toilet tissue hematochezia at times of straining, she does have history of hemorrhoids noted on previous colonoscopy. Unfortunately at this time she is not a candidate for colonoscopy given her multi morbidities. Was advised to do miralax TID at last visit, though per [facility] appears she is taking  miralax BID, senna daily and dulcolax PRN. Would recommend increasing miralax to TID and continue senna daily.   Upper abdominal pain/nausea/GERD: endorses ongoing GERD symptoms as well as chronic nausea and upper abdominal pain. Query if nausea and upper abdominal pain are secondary to dyspepsia/GERD as well. She is maintained on protonix 20mg  daily, would recommend increasing protonix to 40mg  daily at this time. She should continue with zofran PRN and bentyl 10mg  BID PRN for her abdominal pain.   Pancreatic lesion/IPMN: it was decided at last OV, after discussion with patient's legal guardian that given patient's multiple morbidities and frailty, making her a poor surgical candidate, continuing cross sectional imaging for surveillance would not be beneficial for her, therefore no further imaging to be done at this time.    PLAN:  -increase protonix to 40mg  daily -increase miralax 1 capful TID -continue zofran PRN  -continue bentyl 10mg  BID  PRN -continue senna daily   All questions were answered, patient verbalized understanding and is in agreement with plan as outlined above.   Follow Up: 6 months   Hannibal Skalla L. Jeanmarie Hubert, MSN, APRN, AGNP-C Adult-Gerontology Nurse Practitioner [facility]  I have reviewed the note and agree with the APP's assessment as described in this progress note  [name], MD Gastroenterology and Hepatology [facility] Gastroenterology

## 2023-10-19 NOTE — Patient Instructions (Signed)
 Increase protonix to 40mg  daily Increase miralax 17g TID Continue bentyl 10mg  BID PRN Continue zofran PRN for nausea  Follow up 6 months

## 2023-11-02 ENCOUNTER — Ambulatory Visit (INDEPENDENT_AMBULATORY_CARE_PROVIDER_SITE_OTHER): Payer: Self-pay

## 2023-11-02 DIAGNOSIS — I495 Sick sinus syndrome: Secondary | ICD-10-CM

## 2023-11-04 LAB — CUP PACEART REMOTE DEVICE CHECK
Battery Remaining Longevity: 118 mo
Battery Voltage: 3.02 V
Brady Statistic AP VP Percent: 42.54 %
Brady Statistic AP VS Percent: 51.65 %
Brady Statistic AS VP Percent: 0.04 %
Brady Statistic AS VS Percent: 5.77 %
Brady Statistic RA Percent Paced: 94.16 %
Brady Statistic RV Percent Paced: 42.57 %
Date Time Interrogation Session: 20250330104918
Implantable Lead Connection Status: 753985
Implantable Lead Connection Status: 753985
Implantable Lead Implant Date: 20100526
Implantable Lead Implant Date: 20100526
Implantable Lead Location: 753859
Implantable Lead Location: 753860
Implantable Lead Model: 5076
Implantable Lead Model: 5076
Implantable Pulse Generator Implant Date: 20231213
Lead Channel Impedance Value: 304 Ohm
Lead Channel Impedance Value: 304 Ohm
Lead Channel Impedance Value: 342 Ohm
Lead Channel Impedance Value: 342 Ohm
Lead Channel Pacing Threshold Amplitude: 0.625 V
Lead Channel Pacing Threshold Amplitude: 1.125 V
Lead Channel Pacing Threshold Pulse Width: 0.4 ms
Lead Channel Pacing Threshold Pulse Width: 0.4 ms
Lead Channel Sensing Intrinsic Amplitude: 1.375 mV
Lead Channel Sensing Intrinsic Amplitude: 1.375 mV
Lead Channel Sensing Intrinsic Amplitude: 5.375 mV
Lead Channel Sensing Intrinsic Amplitude: 5.375 mV
Lead Channel Setting Pacing Amplitude: 1.5 V
Lead Channel Setting Pacing Amplitude: 2.5 V
Lead Channel Setting Pacing Pulse Width: 0.6 ms
Lead Channel Setting Sensing Sensitivity: 1.2 mV
Zone Setting Status: 755011

## 2023-11-08 ENCOUNTER — Encounter: Payer: Self-pay | Admitting: Internal Medicine

## 2023-11-10 DIAGNOSIS — G4700 Insomnia, unspecified: Secondary | ICD-10-CM | POA: Diagnosis not present

## 2023-11-18 ENCOUNTER — Emergency Department (HOSPITAL_COMMUNITY)
Admission: EM | Admit: 2023-11-18 | Discharge: 2023-11-18 | Disposition: A | Discharge status: Home / Self Care | Attending: Emergency Medicine | Admitting: Emergency Medicine

## 2023-11-18 ENCOUNTER — Other Ambulatory Visit: Payer: Self-pay

## 2023-11-18 ENCOUNTER — Emergency Department (HOSPITAL_COMMUNITY)

## 2023-11-18 ENCOUNTER — Encounter (HOSPITAL_COMMUNITY): Payer: Self-pay

## 2023-11-18 DIAGNOSIS — Z79899 Other long term (current) drug therapy: Secondary | ICD-10-CM | POA: Insufficient documentation

## 2023-11-18 DIAGNOSIS — Z23 Encounter for immunization: Secondary | ICD-10-CM | POA: Diagnosis not present

## 2023-11-18 DIAGNOSIS — S0101XA Laceration without foreign body of scalp, initial encounter: Secondary | ICD-10-CM | POA: Insufficient documentation

## 2023-11-18 DIAGNOSIS — W108XXA Fall (on) (from) other stairs and steps, initial encounter: Secondary | ICD-10-CM | POA: Insufficient documentation

## 2023-11-18 DIAGNOSIS — M25552 Pain in left hip: Secondary | ICD-10-CM | POA: Diagnosis not present

## 2023-11-18 DIAGNOSIS — M545 Low back pain, unspecified: Secondary | ICD-10-CM | POA: Diagnosis not present

## 2023-11-18 DIAGNOSIS — S0990XA Unspecified injury of head, initial encounter: Secondary | ICD-10-CM | POA: Diagnosis not present

## 2023-11-18 DIAGNOSIS — M79672 Pain in left foot: Secondary | ICD-10-CM | POA: Diagnosis not present

## 2023-11-18 DIAGNOSIS — W19XXXA Unspecified fall, initial encounter: Secondary | ICD-10-CM | POA: Diagnosis not present

## 2023-11-18 DIAGNOSIS — R58 Hemorrhage, not elsewhere classified: Secondary | ICD-10-CM | POA: Diagnosis not present

## 2023-11-18 DIAGNOSIS — Z7982 Long term (current) use of aspirin: Secondary | ICD-10-CM | POA: Insufficient documentation

## 2023-11-18 DIAGNOSIS — I10 Essential (primary) hypertension: Secondary | ICD-10-CM | POA: Diagnosis not present

## 2023-11-18 DIAGNOSIS — Z950 Presence of cardiac pacemaker: Secondary | ICD-10-CM | POA: Insufficient documentation

## 2023-11-18 DIAGNOSIS — R6889 Other general symptoms and signs: Secondary | ICD-10-CM | POA: Diagnosis not present

## 2023-11-18 DIAGNOSIS — Z743 Need for continuous supervision: Secondary | ICD-10-CM | POA: Diagnosis not present

## 2023-11-18 MED ORDER — TETANUS-DIPHTH-ACELL PERTUSSIS 5-2.5-18.5 LF-MCG/0.5 IM SUSY
0.5000 mL | PREFILLED_SYRINGE | Freq: Once | INTRAMUSCULAR | Status: AC
  Administered 2023-11-18: 0.5 mL via INTRAMUSCULAR
  Filled 2023-11-18: qty 0.5

## 2023-11-18 MED ORDER — LIDOCAINE HCL (PF) 1 % IJ SOLN
5.0000 mL | Freq: Once | INTRAMUSCULAR | Status: AC
  Administered 2023-11-18: 5 mL via INTRADERMAL
  Filled 2023-11-18: qty 5

## 2023-11-18 MED ORDER — ACETAMINOPHEN 325 MG PO TABS
650.0000 mg | ORAL_TABLET | Freq: Once | ORAL | Status: AC
  Administered 2023-11-18: 650 mg via ORAL
  Filled 2023-11-18: qty 2

## 2023-11-18 NOTE — ED Provider Notes (Addendum)
 [place] EMERGENCY DEPARTMENT AT [facility] Provider Note   CSN: [id number] Arrival date & time: 11/18/23  1333     History  Chief Complaint  Patient presents with   Fall   Head Injury    [name] is a 88 y.o. female.   Fall Associated symptoms include headaches. Pertinent negatives include no chest pain and no shortness of breath.  Head Injury Associated symptoms: headache   Associated symptoms: no nausea, no neck pain and no vomiting        [name] is a 88 y.o. female who resides at [facility] with past medical history of atrial fibrillation, has cardiac pacemaker, anxiety, chronic back pain, anemia, [name] syndrome and hypertension who presents to the Emergency Department send here for evaluation of fall.  Patient had witnessed fall by another resident.  She was attempting to fill a bedpan with water  and was standing at the sink when she fell backwards striking her head on either a wardrobe or the wall.  No reported LOC, patient does not take blood thinners.  Sent here for evaluation, she is complaining of headache low back pain and left hip pain.  She also complains of pain to her left foot and states that she has a "sore" that needs antibiotics because it is infected.  I contacted the care facility for further evaluation and states that the resident that witnessed the fall is lucid, and patient was at her neurologic baseline prior to EMS being contacted  Home Medications Prior to Admission medications   Medication Sig Start Date End Date Taking? Authorizing Provider  acetaminophen  (TYLENOL ) 325 MG tablet Take 2 tablets (650 mg total) by mouth every 6 (six) hours as needed for mild pain (or Fever >/= 101). Patient taking differently: Take 650 mg by mouth every 4 (four) hours as needed for mild pain (pain score 1-3) (or Fever >/= 101). 11/25/22   Emokpae, Courage, MD  acetaminophen  (TYLENOL ) 500 MG tablet Take 1,000 mg by mouth every 8 (eight)  hours as needed for mild pain (pain score 1-3).    [provider]  albuterol  (PROVENTIL ) (2.5 MG/3ML) 0.083% nebulizer solution Take 3 mLs (2.5 mg total) by nebulization every 4 (four) hours as needed for wheezing or shortness of breath. 07/07/23 07/06/24  Colin Dawley, MD  albuterol  (VENTOLIN  HFA) 108 (90 Base) MCG/ACT inhaler Inhale 1 puff into the lungs every 4 (four) hours as needed for wheezing or shortness of breath. 07/07/23   Colin Dawley, MD  amiodarone  (PACERONE ) 200 MG tablet Take 1 tablet (200 mg total) by mouth daily. 03/16/23   Amin, Katharyn Pall, MD  aspirin  EC 81 MG tablet Take 1 tablet (81 mg total) by mouth daily with breakfast. Swallow whole. 07/02/22   Colin Dawley, MD  atorvastatin  (LIPITOR ) 80 MG tablet Take 1 tablet (80 mg total) by mouth daily. 02/06/23   Gerard Knight, MD  bisacodyl  (DULCOLAX) 10 MG suppository Place 10 mg rectally daily as needed for moderate constipation.    [provider]  Cyanocobalamin  (VITAMIN B-12) 5000 MCG SUBL Take 5,000 mcg by mouth daily.    [provider]  dicyclomine  (BENTYL ) 10 MG capsule Take 1 capsule (10 mg total) by mouth every 12 (twelve) hours as needed for spasms (abdominal pain). 12/08/22   Urban Garden, MD  divalproex (DEPAKOTE) 250 MG DR tablet Take 250 mg by mouth 2 (two) times daily.    [provider]  folic acid  (FOLVITE ) 1  MG tablet Take 1 tablet (1 mg total) by mouth daily. 11/12/22   Demaris Fillers, MD  furosemide  (LASIX ) 20 MG tablet Take 1 tablet (20 mg total) by mouth every evening. 07/07/23 07/06/24  Colin Dawley, MD  furosemide  (LASIX ) 40 MG tablet Take 1 tablet (40 mg total) by mouth daily. 07/07/23   Colin Dawley, MD  guaifenesin  (ROBITUSSIN) 100 MG/5ML syrup Take 600 mg by mouth 2 (two) times daily.    [provider]  isosorbide  mononitrate (IMDUR ) 30 MG 24 hr tablet Take 1 tablet (30 mg total) by mouth daily. 03/16/23   Amin, Ankit C, MD  LORazepam  (ATIVAN )  1 MG tablet Take 1 mg by mouth every 8 (eight) hours.    [provider]  melatonin 5 MG TABS Take 5 mg by mouth at bedtime.    [provider]  metoprolol  succinate (TOPROL -XL) 50 MG 24 hr tablet Take 1 tablet (50 mg total) by mouth 2 (two) times daily. Take with or immediately following a meal. 03/15/23   Amin, Ankit C, MD  nitroGLYCERIN  (NITROLINGUAL ) 0.4 MG/SPRAY spray Place 1 spray under the tongue every 5 (five) minutes x 3 doses as needed for chest pain. 07/07/23   [provider]  Nutritional Supplements (NUTRITIONAL SUPPLEMENT PO) Take 237 mLs by mouth in the morning, at noon, and at bedtime.    [provider]  ondansetron  (ZOFRAN ) 4 MG tablet Take 4 mg by mouth every 6 (six) hours as needed for vomiting or nausea.    [provider]  pantoprazole  (PROTONIX ) 20 MG tablet Take 1 tablet (20 mg total) by mouth daily. 04/20/23   Urban Garden, MD  PARoxetine  (PAXIL ) 10 MG tablet Take 20 mg by mouth daily. 06/26/23   [provider]  polyethylene glycol (MIRALAX  / GLYCOLAX ) 17 g packet Take 17 g by mouth 2 (two) times daily.    [provider]  potassium chloride  SA (KLOR-CON  M) 20 MEQ tablet Take 20 mEq by mouth daily.    [provider]  ranolazine  (RANEXA ) 500 MG 12 hr tablet Take 1 tablet (500 mg total) by mouth 2 (two) times daily. 07/02/22   Colin Dawley, MD  rOPINIRole (REQUIP) 0.5 MG tablet Take 0.5 mg by mouth daily. 01/20/23   [provider]  senna-docusate (SENOKOT-S) 8.6-50 MG tablet Take 2 tablets by mouth at bedtime. Patient taking differently: Take 1 tablet by mouth at bedtime. 11/25/22 11/25/23  Colin Dawley, MD  ticagrelor  (BRILINTA ) 60 MG TABS tablet Take 1 tablet (60 mg total) by mouth 2 (two) times daily. 07/07/23   Colin Dawley, MD  vitamin E  180 MG (400 UNITS) capsule Take 400 Units by mouth daily.    [provider]      Allergies    Amitriptyline hcl, Plavix   [clopidogrel  bisulfate], and Sulfonamide derivatives    Review of Systems   Review of Systems  Constitutional:  Negative for chills and fever.  Eyes:  Negative for visual disturbance.  Respiratory:  Negative for shortness of breath.   Cardiovascular:  Negative for chest pain.  Gastrointestinal:  Negative for nausea and vomiting.  Musculoskeletal:  Positive for arthralgias (left hip pain) and back pain. Negative for neck pain and neck stiffness.  Skin:  Positive for wound.       Laceration back of scalp  Neurological:  Positive for headaches. Negative for dizziness, syncope, speech difficulty and weakness.    Physical Exam Updated Vital Signs BP 90/77 (BP Location: Left Arm)   Pulse  69   Temp 97.9 F (36.6 C) (Oral)   Resp 18   Ht 5\' 5"  (1.651 m)   Wt 82.6 kg   SpO2 100%   BMI 30.29 kg/m  Physical Exam Vitals and nursing note reviewed.  Constitutional:      Appearance: Normal appearance.  HENT:     Head:     Comments: Small hematoma of the occipital scalp with 2 cm laceration.  No active bleeding.      Mouth/Throat:     Mouth: Mucous membranes are moist.  Eyes:     Extraocular Movements: Extraocular movements intact.     Conjunctiva/sclera: Conjunctivae normal.     Pupils: Pupils are equal, round, and reactive to light.  Cardiovascular:     Rate and Rhythm: Normal rate and regular rhythm.     Pulses: Normal pulses.  Pulmonary:     Effort: Pulmonary effort is normal.  Chest:     Chest wall: No tenderness.  Abdominal:     Palpations: Abdomen is soft.     Tenderness: There is no abdominal tenderness.  Musculoskeletal:        General: Tenderness present. Normal range of motion.     Cervical back: Normal range of motion. No tenderness.     Comments: Ttp of the left hip and lower lumbar spine.  Skin:    General: Skin is warm.     Capillary Refill: Capillary refill takes less than 2 seconds.     Comments: Patient has a small crusted lesion of the left lateral great  toe that appears to be healing well.  No edema or surrounding erythema of the foot or toe.    Neurological:     General: No focal deficit present.     Mental Status: She is alert.     Sensory: No sensory deficit.     Motor: No weakness.     ED Results / Procedures / Treatments   Labs (all labs ordered are listed, but only abnormal results are displayed) Labs Reviewed - No data to display  EKG None  Radiology CT Head Wo Contrast Result Date: 11/18/2023 CLINICAL DATA:  Provided history: Head trauma, minor. Neck trauma. Additional history provided: Fall. Posterior head laceration. EXAM: CT HEAD WITHOUT CONTRAST CT CERVICAL SPINE WITHOUT CONTRAST TECHNIQUE: Multidetector CT imaging of the head and cervical spine was performed following the standard protocol without intravenous contrast. Multiplanar CT image reconstructions of the cervical spine were also generated. RADIATION DOSE REDUCTION: This exam was performed according to the departmental dose-optimization program which includes automated exposure control, adjustment of the mA and/or kV according to patient size and/or use of iterative reconstruction technique. COMPARISON:  Head CT 08/10/2023.  Cervical spine CT 08/10/2023. FINDINGS: CT HEAD FINDINGS Brain: Generalized cerebral atrophy. Chronic infarct again demonstrated within the left frontal lobe white matter and left basal ganglia. There is no acute intracranial hemorrhage. No demarcated cortical infarct. No extra-axial fluid collection. No evidence of an intracranial mass. No midline shift. Vascular: No hyperdense vessel.  Atherosclerotic calcifications. Skull: No calvarial fracture or aggressive osseous lesion. Sinuses/Orbits: No mass or acute finding within the imaged orbits. No significant paranasal sinus disease at the imaged levels. Other: Occipital scalp hematoma/laceration. A right parietal scalp hematoma is also suspected. CT CERVICAL SPINE FINDINGS Alignment: 2 mm C7-T1 grade 1  anterolisthesis. Slight grade 1 anterolisthesis at T1-T2 and T2-T3. Skull base and vertebrae: The basion-dental and atlanto-dental intervals are maintained.No evidence of acute fracture to the cervical spine. Mild chronic C7  superior endplate compression deformity versus Schmorl node, unchanged. Soft tissues and spinal canal: No prevertebral fluid or swelling. No visible canal hematoma. Disc levels: Cervical spondylosis with multilevel disc space narrowing, disc bulges/central disc protrusions, posterior disc osteophyte complexes, uncovertebral hypertrophy and facet arthropathy. Multilevel spinal canal narrowing. Most notably, central disc protrusions contribute to up to moderate spinal canal stenosis at C2-C3 and C3-C4. Multilevel bony neural foraminal narrowing. Degenerative changes also present at the C1-C2 articulation. Upper chest: No consolidation within the imaged lung apices. No visible pneumothorax. Emphysema. Biapical pleuroparenchymal scarring. Other: Enlarged and heterogeneous thyroid  gland. IMPRESSION: CT head: 1. No evidence of an acute intracranial abnormality. 2. Occipital scalp hematoma/laceration. 3. Right parietal scalp hematoma also suspected. 4. Chronic infarct again demonstrated within the left frontal lobe white matter and left basal ganglia. 5. Cerebral atrophy. CT cervical spine: 1. No evidence of an acute cervical spine fracture. 2. Mild grade 1 anterolisthesis at C7-T1, T1-T2 and T2-T3. 3. Cervical spondylosis as described. 4. Mild chronic C7 superior endplate vertebral compression deformity versus Schmorl node, unchanged from the cervical spine CT of 08/10/2023. 5. Enlarged and heterogeneous thyroid  gland. Given the patient's age, a follow-up non-emergent thyroid  ultrasound may be obtained for further evaluation as clinically appropriate. Reference: J Am Coll Radiol. 2015 Feb;12(2): 143-50. 6. Emphysema (ICD10-J43.9). Electronically Signed   By: Bascom Lily D.O.   On: 11/18/2023 15:17    CT Cervical Spine Wo Contrast Result Date: 11/18/2023 CLINICAL DATA:  Provided history: Head trauma, minor. Neck trauma. Additional history provided: Fall. Posterior head laceration. EXAM: CT HEAD WITHOUT CONTRAST CT CERVICAL SPINE WITHOUT CONTRAST TECHNIQUE: Multidetector CT imaging of the head and cervical spine was performed following the standard protocol without intravenous contrast. Multiplanar CT image reconstructions of the cervical spine were also generated. RADIATION DOSE REDUCTION: This exam was performed according to the departmental dose-optimization program which includes automated exposure control, adjustment of the mA and/or kV according to patient size and/or use of iterative reconstruction technique. COMPARISON:  Head CT 08/10/2023.  Cervical spine CT 08/10/2023. FINDINGS: CT HEAD FINDINGS Brain: Generalized cerebral atrophy. Chronic infarct again demonstrated within the left frontal lobe white matter and left basal ganglia. There is no acute intracranial hemorrhage. No demarcated cortical infarct. No extra-axial fluid collection. No evidence of an intracranial mass. No midline shift. Vascular: No hyperdense vessel.  Atherosclerotic calcifications. Skull: No calvarial fracture or aggressive osseous lesion. Sinuses/Orbits: No mass or acute finding within the imaged orbits. No significant paranasal sinus disease at the imaged levels. Other: Occipital scalp hematoma/laceration. A right parietal scalp hematoma is also suspected. CT CERVICAL SPINE FINDINGS Alignment: 2 mm C7-T1 grade 1 anterolisthesis. Slight grade 1 anterolisthesis at T1-T2 and T2-T3. Skull base and vertebrae: The basion-dental and atlanto-dental intervals are maintained.No evidence of acute fracture to the cervical spine. Mild chronic C7 superior endplate compression deformity versus Schmorl node, unchanged. Soft tissues and spinal canal: No prevertebral fluid or swelling. No visible canal hematoma. Disc levels: Cervical  spondylosis with multilevel disc space narrowing, disc bulges/central disc protrusions, posterior disc osteophyte complexes, uncovertebral hypertrophy and facet arthropathy. Multilevel spinal canal narrowing. Most notably, central disc protrusions contribute to up to moderate spinal canal stenosis at C2-C3 and C3-C4. Multilevel bony neural foraminal narrowing. Degenerative changes also present at the C1-C2 articulation. Upper chest: No consolidation within the imaged lung apices. No visible pneumothorax. Emphysema. Biapical pleuroparenchymal scarring. Other: Enlarged and heterogeneous thyroid  gland. IMPRESSION: CT head: 1. No evidence of an acute intracranial abnormality. 2. Occipital scalp hematoma/laceration. 3. Right  parietal scalp hematoma also suspected. 4. Chronic infarct again demonstrated within the left frontal lobe white matter and left basal ganglia. 5. Cerebral atrophy. CT cervical spine: 1. No evidence of an acute cervical spine fracture. 2. Mild grade 1 anterolisthesis at C7-T1, T1-T2 and T2-T3. 3. Cervical spondylosis as described. 4. Mild chronic C7 superior endplate vertebral compression deformity versus Schmorl node, unchanged from the cervical spine CT of 08/10/2023. 5. Enlarged and heterogeneous thyroid  gland. Given the patient's age, a follow-up non-emergent thyroid  ultrasound may be obtained for further evaluation as clinically appropriate. Reference: J Am Coll Radiol. 2015 Feb;12(2): 143-50. 6. Emphysema (ICD10-J43.9). Electronically Signed   By: Bascom Lily D.O.   On: 11/18/2023 15:17   DG Lumbar Spine Complete Result Date: 11/18/2023 CLINICAL DATA:  fall. EXAM: LUMBAR SPINE - COMPLETE 4+ VIEW COMPARISON:  Lumbar spine radiograph from 08/10/2017. FINDINGS: There is diffuse osteopenia of the visualized osseous structures. There are 5 nonrib-bearing lumbar vertebrae. Anatomic lumbar curvature. No spondylolysis or spondylolisthesis. There is mild loss of height of L1 vertebral body, similar  to the prior study. Remaining vertebral body heights are maintained. No aggressive osseous lesion. Mild-moderate multilevel degenerative changes in the form of reduced intervertebral disc height, endplate sclerosis/irregularity, facet arthropathy and marginal osteophyte formation. Sacroiliac joints are symmetric. Visualized soft tissues are within normal limits. IMPRESSION: No acute osseous abnormality of the lumbar spine. Electronically Signed   By: Beula Brunswick M.D.   On: 11/18/2023 15:13   DG Hip Unilat W or Wo Pelvis 2-3 Views Right Result Date: 11/18/2023 CLINICAL DATA:  Fall/pain. EXAM: DG HIP (WITH OR WITHOUT PELVIS) 2-3V RIGHT COMPARISON:  None Available. FINDINGS: There is diffuse osteopenia of the visualized osseous structures. Pelvis is intact with normal and symmetric sacroiliac joints. No acute fracture or dislocation. No aggressive osseous lesion. Visualized sacral arcuate lines are unremarkable. There are changes of chronic pubic symphisitis. There are mild degenerative changes of bilateral hip joints without significant joint space narrowing. Osteophytosis of the superior acetabulum. No radiopaque foreign bodies. IMPRESSION: *No acute osseous abnormality of the pelvis or right hip joint. Electronically Signed   By: Beula Brunswick M.D.   On: 11/18/2023 15:10    Procedures Procedures     LACERATION REPAIR Performed by: Amala Petion Authorized by: Misao Fackrell Consent: Verbal consent obtained. Risks and benefits: risks, benefits and alternatives were discussed Consent given by: patient Patient identity confirmed: provided demographic data Prepped and Draped in normal sterile fashion Wound explored  Laceration Location: occiptal scalp  Laceration Length: 1.5 cm  No Foreign Bodies seen or palpated  Anesthesia: local infiltration  Local anesthetic: lidocaine  1% w/o epinephrine   Anesthetic total: 3 ml  Irrigation method: syringe Amount of cleaning:  standard   Number of staples: 4  Technique: stapled  Patient tolerance: Patient tolerated the procedure well with no immediate complications.  Medications Ordered in ED Medications - No data to display  ED Course/ Medical Decision Making/ A&P                                 Medical Decision Making Patient brought into the emergency department from [facility] skilled nursing facility for evaluation of fall.  Per patient and caregiver at facility, patient had a witnessed fall in which she struck the back of her head on a wardrobe or wall.  No LOC, she does not take blood thinners.  Caregiver at facility states she is been at her  neurologic baseline.  Had a laceration to the back of her scalp.  Patient has endorsed headache low back pain and left hip pain during her evaluation here.  She is also complained multiple times of pain to her bilateral feet.  She is able to perform full range of motion of bilateral feet and ankles.  No bruising or bony deformity on exam.  She does have a wound to the left great toe that appears to be healing.  I do not appreciate any symptoms suggestive of infection of the toe or foot.  Patient continues to state that she needs antibiotics for her foot.  I do not find any clinical symptoms to indicate need for antibiotics at this time.  Laceration of the scalp with likely hematoma.  Skull fracture, intracranial injury hip fracture, dislocation musculoskeletal injury all considered She is very hard of hearing but appears to be mentating well.  Tolerating oral fluids without difficulty.   Amount and/or Complexity of Data Reviewed Radiology: ordered.    Details: CT head and C-spine no evidence of acute intracranial abnormality shows occipital scalp hematoma laceration.  Cervical spine imaging without evidence of acute cervical spine fracture  Plain film x-rays of the left hip and L-spine without acute bony abnormality of the hip or pelvis, no bony abnormality of  the lumbar spine Discussion of management or test interpretation with external provider(s):  Patient has been observed in the department without complication.  Mentating well here.  Tetanus updated, Tylenol  given for headache.  Wound cleaned and fully examined through depth of wound prior to closure.  Wound well-approximated with 4 staples.  Hemostasis obtained.  Patient able to bear weight at bedside only able to make a few steps.  This is confirmed to be her baseline by nursing facility.  Spoke with [place] at [facility] and informed the patient will be charged back to facility.  Feel the patient is appropriate for discharge back to facility, will be given head injury and wound instructions, will continue with Tylenol  every 4 hours if needed for pain, wound care instructions provided and sutures out in 5 to 7 days  Risk OTC drugs. Prescription drug management.           Final Clinical Impression(s) / ED Diagnoses Final diagnoses:  Injury of head, initial encounter  Laceration of scalp, initial encounter  Fall, initial encounter    Rx / DC Orders ED Discharge Orders     None         Catherne Clubs, PA-C 11/18/23 1813    Catherne Clubs, PA-C 11/18/23 1856    Ninetta Basket, MD 11/20/23 [phone]

## 2023-11-18 NOTE — Discharge Instructions (Addendum)
 Keep the wound clean with mild soap and water.  You have 4 staples to the back of your head.  They will need to be removed in 5 to 7 days.  Follow-up with primary care provider for recheck she may continue Tylenol as directed if needed for pain

## 2023-11-18 NOTE — ED Notes (Signed)
 Patient transported to CT

## 2023-11-18 NOTE — ED Triage Notes (Signed)
 Pt from [facility] via [facility] with reports of fall from standing. Pt denies LOC. Pt has laceration to back of head. Does not use blood thinners.

## 2023-12-08 ENCOUNTER — Ambulatory Visit (INDEPENDENT_AMBULATORY_CARE_PROVIDER_SITE_OTHER): Payer: 59 | Admitting: Gastroenterology

## 2023-12-09 DIAGNOSIS — L03116 Cellulitis of left lower limb: Secondary | ICD-10-CM | POA: Diagnosis not present

## 2023-12-17 NOTE — Progress Notes (Signed)
 Remote pacemaker transmission.

## 2023-12-17 NOTE — Addendum Note (Signed)
 Addended by: Edra Govern D on: 12/17/2023 10:04 AM   Modules accepted: Orders

## 2024-01-01 ENCOUNTER — Emergency Department (HOSPITAL_COMMUNITY)
Admission: EM | Admit: 2024-01-01 | Discharge: 2024-01-01 | Disposition: A | Discharge status: Home / Self Care | Attending: Emergency Medicine | Admitting: Emergency Medicine

## 2024-01-01 ENCOUNTER — Emergency Department (HOSPITAL_COMMUNITY)

## 2024-01-01 ENCOUNTER — Other Ambulatory Visit: Payer: Self-pay

## 2024-01-01 ENCOUNTER — Encounter (HOSPITAL_COMMUNITY): Payer: Self-pay | Admitting: Emergency Medicine

## 2024-01-01 DIAGNOSIS — W19XXXA Unspecified fall, initial encounter: Secondary | ICD-10-CM

## 2024-01-01 DIAGNOSIS — S0990XA Unspecified injury of head, initial encounter: Secondary | ICD-10-CM | POA: Diagnosis not present

## 2024-01-01 DIAGNOSIS — W050XXA Fall from non-moving wheelchair, initial encounter: Secondary | ICD-10-CM | POA: Insufficient documentation

## 2024-01-01 DIAGNOSIS — R404 Transient alteration of awareness: Secondary | ICD-10-CM | POA: Diagnosis not present

## 2024-01-01 DIAGNOSIS — R6889 Other general symptoms and signs: Secondary | ICD-10-CM | POA: Diagnosis not present

## 2024-01-01 DIAGNOSIS — Z7982 Long term (current) use of aspirin: Secondary | ICD-10-CM | POA: Diagnosis not present

## 2024-01-01 DIAGNOSIS — R531 Weakness: Secondary | ICD-10-CM | POA: Diagnosis not present

## 2024-01-01 DIAGNOSIS — S0191XA Laceration without foreign body of unspecified part of head, initial encounter: Secondary | ICD-10-CM | POA: Diagnosis not present

## 2024-01-01 DIAGNOSIS — R9082 White matter disease, unspecified: Secondary | ICD-10-CM | POA: Diagnosis not present

## 2024-01-01 DIAGNOSIS — R251 Tremor, unspecified: Secondary | ICD-10-CM | POA: Insufficient documentation

## 2024-01-01 DIAGNOSIS — S0083XA Contusion of other part of head, initial encounter: Secondary | ICD-10-CM | POA: Insufficient documentation

## 2024-01-01 DIAGNOSIS — Y92129 Unspecified place in nursing home as the place of occurrence of the external cause: Secondary | ICD-10-CM | POA: Insufficient documentation

## 2024-01-01 MED ORDER — ACETAMINOPHEN 325 MG PO TABS
ORAL_TABLET | ORAL | Status: AC
  Filled 2024-01-01: qty 2

## 2024-01-01 MED ORDER — IOHEXOL 300 MG/ML  SOLN: 100.0000 mL | Freq: Once | INTRAMUSCULAR | Status: DC | PRN

## 2024-01-01 MED ORDER — METOCLOPRAMIDE HCL 5 MG/ML IJ SOLN
INTRAMUSCULAR | Status: AC
  Filled 2024-01-01: qty 2

## 2024-01-01 MED ORDER — LIDOCAINE-EPINEPHRINE-TETRACAINE (LET) TOPICAL GEL
3.0000 mL | Freq: Once | TOPICAL | Status: AC
  Administered 2024-01-01: 3 mL via TOPICAL
  Filled 2024-01-01: qty 3

## 2024-01-01 MED ORDER — ACETAMINOPHEN 325 MG PO TABS
650.0000 mg | ORAL_TABLET | Freq: Once | ORAL | Status: AC
  Administered 2024-01-01: 650 mg via ORAL

## 2024-01-01 NOTE — ED Notes (Signed)
 [facility] called to transfer patient back to living facility. Nurse notified

## 2024-01-01 NOTE — ED Triage Notes (Signed)
 Pt from [facility]. Staff reports pt fell out of her wheelchair. Laceration to the back of her head. Unknown LOC per EMS. Bleeding controlled. Pt on ASA and brilinta 

## 2024-01-01 NOTE — ED Notes (Signed)
 ED Provider at bedside.

## 2024-01-01 NOTE — ED Provider Notes (Signed)
 [name] EM[facility] DE[facility]MENT AT [facility] Provider Note   CSN: [id number] Arrival date & time: 01/01/24  1325     History  Chief Complaint  Patient presents with   [name]    [name] is a 88 y.o. female.  HPI Patient presents via EMS from a nursing facility after a fall.  Patient with baseline limited mobility, reportedly fell from her wheelchair striking the back of her head.  EMS reports no known loss of consciousness.  Patient is on aspirin , Brilinta .  The patient herself seemingly denies complaints, though her cognitive status is limiting in terms of history. EMS reports no hemodynamic instability.    Home Medications Prior to Admission medications   Medication Sig Start Date End Date Taking? Authorizing Provider  acetaminophen  (TYLENOL ) 325 MG tablet Take 2 tablets (650 mg total) by mouth every 6 (six) hours as needed for mild pain (or Fever >/= 101). Patient taking differently: Take 650 mg by mouth every 4 (four) hours as needed for mild pain (pain score 1-3) (or Fever >/= 101). 11/25/22   Emokpae, Courage, MD  acetaminophen  (TYLENOL ) 500 MG tablet Take 1,000 mg by mouth every 8 (eight) hours as needed for mild pain (pain score 1-3).    [provider]  albuterol  (PROVENTIL ) (2.5 MG/3ML) 0.083% nebulizer solution Take 3 mLs (2.5 mg total) by nebulization every 4 (four) hours as needed for wheezing or shortness of breath. 07/07/23 07/06/24  Colin Dawley, MD  albuterol  (VENTOLIN  HFA) 108 (90 Base) MCG/ACT inhaler Inhale 1 puff into the lungs every 4 (four) hours as needed for wheezing or shortness of breath. 07/07/23   Colin Dawley, MD  amiodarone  (PACERONE ) 200 MG tablet Take 1 tablet (200 mg total) by mouth daily. 03/16/23   Amin, Ankit C, MD  aspirin  EC 81 MG tablet Take 1 tablet (81 mg total) by mouth daily with breakfast. Swallow whole. 07/02/22   Colin Dawley, MD  atorvastatin  (LIPITOR ) 80 MG tablet Take 1 tablet (80 mg total) by mouth daily.  02/06/23   Gerard Knight, MD  bisacodyl  (DULCOLAX) 10 MG suppository Place 10 mg rectally daily as needed for moderate constipation.    [provider]  Cyanocobalamin  (VITAMIN B-12) 5000 MCG SUBL Take 5,000 mcg by mouth daily.    [provider]  dicyclomine  (BENTYL ) 10 MG capsule Take 1 capsule (10 mg total) by mouth every 12 (twelve) hours as needed for spasms (abdominal pain). 12/08/22   Urban Garden, MD  divalproex (DEPAKOTE) 250 MG DR tablet Take 250 mg by mouth 2 (two) times daily.    [provider]  folic acid  (FOLVITE ) 1 MG tablet Take 1 tablet (1 mg total) by mouth daily. 11/12/22   Demaris Fillers, MD  furosemide  (LASIX ) 20 MG tablet Take 1 tablet (20 mg total) by mouth every evening. 07/07/23 07/06/24  Colin Dawley, MD  furosemide  (LASIX ) 40 MG tablet Take 1 tablet (40 mg total) by mouth daily. 07/07/23   Colin Dawley, MD  guaifenesin  (ROBITUSSIN) 100 MG/5ML syrup Take 600 mg by mouth 2 (two) times daily.    [provider]  isosorbide  mononitrate (IMDUR ) 30 MG 24 hr tablet Take 1 tablet (30 mg total) by mouth daily. 03/16/23   Amin, Ankit C, MD  LORazepam  (ATIVAN ) 1 MG tablet Take 1 mg by mouth every 8 (eight) hours.    [provider]  melatonin 5 MG TABS Take 5 mg by mouth at bedtime.    [provider]  metoprolol  succinate (TOPROL -XL) 50 MG 24 hr tablet Take 1 tablet (50 mg total) by mouth 2 (two) times daily. Take with or immediately following a meal. 03/15/23   Amin, Ankit C, MD  Morphine  Sulfate (MORPHINE  CONCENTRATE) 10 mg / 0.5 ml concentrated solution Take 0.25 mLs by mouth every 2 (two) hours as needed for severe pain (pain score 7-10). 11/03/23   [provider]  nitroGLYCERIN  (NITROLINGUAL ) 0.4 MG/SPRAY spray Place 1 spray under the tongue every 5 (five) minutes x 3 doses as needed for chest pain. 07/07/23   [provider]  Nutritional Supplements (NUTRITIONAL SUPPLEMENT PO) Take 237 mLs by  mouth in the morning, at noon, and at bedtime.    [provider]  ondansetron  (ZOFRAN ) 4 MG tablet Take 4 mg by mouth every 6 (six) hours as needed for vomiting or nausea.    [provider]  pantoprazole  (PROTONIX ) 20 MG tablet Take 1 tablet (20 mg total) by mouth daily. 04/20/23   Urban Garden, MD  PARoxetine  (PAXIL ) 10 MG tablet Take 20 mg by mouth daily. 06/26/23   [provider]  polyethylene glycol (MIRALAX  / GLYCOLAX ) 17 g packet Take 17 g by mouth 2 (two) times daily.    [provider]  potassium chloride  SA (KLOR-CON  M) 20 MEQ tablet Take 20 mEq by mouth daily.    [provider]  ranolazine  (RANEXA ) 500 MG 12 hr tablet Take 1 tablet (500 mg total) by mouth 2 (two) times daily. 07/02/22   Colin Dawley, MD  rOPINIRole (REQUIP) 0.5 MG tablet Take 0.5 mg by mouth daily. 01/20/23   [provider]  ticagrelor  (BRILINTA ) 60 MG TABS tablet Take 1 tablet (60 mg total) by mouth 2 (two) times daily. 07/07/23   Colin Dawley, MD  vitamin E  180 MG (400 UNITS) capsule Take 400 Units by mouth daily.    [provider]      Allergies    Amitriptyline hcl, Plavix  [clopidogrel  bisulfate], and Sulfonamide derivatives    Review of Systems   Review of Systems  Physical Exam Updated Vital Signs BP (!) 195/96   Pulse 65   Temp 97.8 F (36.6 C) (Oral)   Resp 16   SpO2 96%  Physical Exam Vitals and nursing note reviewed.  Constitutional:      General: She is not in acute distress.    Appearance: She is well-developed. She is ill-appearing.    HENT:     Head: Normocephalic.  Eyes:     Conjunctiva/sclera: Conjunctivae normal.  Cardiovascular:     Rate and Rhythm: Normal rate and regular rhythm.  Pulmonary:     Effort: Pulmonary effort is normal. No respiratory distress.     Breath sounds: Normal breath sounds. No stridor.  Abdominal:     General: There is no distension.  Skin:    General: Skin is warm and  dry.  Neurological:     Mental Status: She is alert.     Cranial Nerves: No cranial nerve deficit.     Motor: Weakness and tremor present.  Psychiatric:        Behavior: Behavior is slowed and withdrawn.        Cognition and Memory: Cognition is impaired. Memory is impaired.     ED Results / Procedures / Treatments   Labs (all labs ordered are listed, but only abnormal results are displayed) Labs Reviewed - No data to display  EKG None  Radiology CT Head Wo Contrast Result Date: 01/01/2024  CLINICAL DATA:  Fall from wheelchair, laceration to back of head EXAM: CT HEAD WITHOUT CONTRAST CT CERVICAL SPINE WITHOUT CONTRAST TECHNIQUE: Multidetector CT imaging of the head and cervical spine was performed following the standard protocol without intravenous contrast. Multiplanar CT image reconstructions of the cervical spine were also generated. RADIATION DOSE REDUCTION: This exam was performed according to the departmental dose-optimization program which includes automated exposure control, adjustment of the mA and/or kV according to patient size and/or use of iterative reconstruction technique. COMPARISON:  11/18/2023 FINDINGS: CT HEAD FINDINGS Brain: No evidence of acute infarction, hemorrhage, hydrocephalus, extra-axial collection or mass lesion/mass effect. Periventricular white matter hypodensity. Vascular: No hyperdense vessel or unexpected calcification. Skull: Normal. Negative for fracture or focal lesion. Sinuses/Orbits: No acute finding. Other: Soft tissue contusion laceration of the right scalp vertex. CT CERVICAL SPINE FINDINGS Alignment: Normal. Skull base and vertebrae: Osteopenia. No acute fracture. No primary bone lesion or focal pathologic process. Soft tissues and spinal canal: No prevertebral fluid or swelling. No visible canal hematoma. Disc levels: Moderate multilevel cervical disc degenerative disease, worst from C4-C7 Upper chest: Negative. Other: None. IMPRESSION: 1. No acute  intracranial pathology. Small-vessel white matter disease. 2. Soft tissue contusion laceration of the right scalp vertex. 3. No fracture or static subluxation of the cervical spine. 4. Osteopenia and cervical disc degenerative disease. Electronically Signed   By: Fredricka Jenny M.D.   On: 01/01/2024 17:17   CT Cervical Spine Wo Contrast Result Date: 01/01/2024 CLINICAL DATA:  Fall from wheelchair, laceration to back of head EXAM: CT HEAD WITHOUT CONTRAST CT CERVICAL SPINE WITHOUT CONTRAST TECHNIQUE: Multidetector CT imaging of the head and cervical spine was performed following the standard protocol without intravenous contrast. Multiplanar CT image reconstructions of the cervical spine were also generated. RADIATION DOSE REDUCTION: This exam was performed according to the departmental dose-optimization program which includes automated exposure control, adjustment of the mA and/or kV according to patient size and/or use of iterative reconstruction technique. COMPARISON:  11/18/2023 FINDINGS: CT HEAD FINDINGS Brain: No evidence of acute infarction, hemorrhage, hydrocephalus, extra-axial collection or mass lesion/mass effect. Periventricular white matter hypodensity. Vascular: No hyperdense vessel or unexpected calcification. Skull: Normal. Negative for fracture or focal lesion. Sinuses/Orbits: No acute finding. Other: Soft tissue contusion laceration of the right scalp vertex. CT CERVICAL SPINE FINDINGS Alignment: Normal. Skull base and vertebrae: Osteopenia. No acute fracture. No primary bone lesion or focal pathologic process. Soft tissues and spinal canal: No prevertebral fluid or swelling. No visible canal hematoma. Disc levels: Moderate multilevel cervical disc degenerative disease, worst from C4-C7 Upper chest: Negative. Other: None. IMPRESSION: 1. No acute intracranial pathology. Small-vessel white matter disease. 2. Soft tissue contusion laceration of the right scalp vertex. 3. No fracture or static  subluxation of the cervical spine. 4. Osteopenia and cervical disc degenerative disease. Electronically Signed   By: Fredricka Jenny M.D.   On: 01/01/2024 17:17    Procedures Procedures    Medications Ordered in ED Medications  iohexol  (OMNIPAQUE ) 300 MG/ML solution 100 mL (has no administration in time range)  metoCLOPramide  (REGLAN ) 5 MG/ML injection (  Canceled Entry 01/01/24 1725)  lidocaine -EPINEPHrine -tetracaine  (LET) topical gel (3 mLs Topical Given 01/01/24 1506)  acetaminophen  (TYLENOL ) tablet 650 mg (650 mg Oral Given 01/01/24 1506)  acetaminophen  (TYLENOL ) 325 MG tablet (  Given 01/01/24 1537)    ED Course/ Medical Decision Making/ A&P  Medical Decision Making Adult female presents from nursing facility after mechanical fall.  Fall unwitnessed, reportedly from wheelchair height.  Patient is awake, alert, seemingly in baseline condition neurologically, but given use of aspirin , Brilinta , head trauma, with intracranial hemorrhage, cervical spine fracture considered. Patient had let applied to her wound, head CT ordered.  Amount and/or Complexity of Data Reviewed Independent Historian: EMS External Data Reviewed: notes.    Details: Similar injury within the past month Radiology: ordered and independent interpretation performed. Decision-making details documented in ED Course.  Risk OTC drugs. Prescription drug management. Decision regarding hospitalization. Diagnosis or treatment significantly limited by social determinants of health.   6:04 PM Patient in no distress, awake, alert, has been the bathroom several times has had no decompensating events, wound is hemostatic, she has no new complaints, head CT, neck CT reviewed, no fracture, no cranial hemorrhage patient will return to her nursing facility.        Final Clinical Impression(s) / ED Diagnoses Final diagnoses:  Fall, initial encounter  Injury of head, initial encounter     Rx / DC Orders ED Discharge Orders     None         Dorenda Gandy, MD 01/01/24 [phone]

## 2024-01-01 NOTE — Discharge Instructions (Signed)
Return here for concerning changes in your condition. 

## 2024-02-01 DIAGNOSIS — I10 Essential (primary) hypertension: Secondary | ICD-10-CM | POA: Diagnosis not present

## 2024-02-01 DIAGNOSIS — J449 Chronic obstructive pulmonary disease, unspecified: Secondary | ICD-10-CM | POA: Diagnosis not present

## 2024-02-01 DIAGNOSIS — J9621 Acute and chronic respiratory failure with hypoxia: Secondary | ICD-10-CM | POA: Diagnosis not present

## 2024-02-03 DIAGNOSIS — J449 Chronic obstructive pulmonary disease, unspecified: Secondary | ICD-10-CM | POA: Diagnosis not present

## 2024-02-03 DIAGNOSIS — I509 Heart failure, unspecified: Secondary | ICD-10-CM | POA: Diagnosis not present

## 2024-02-03 DIAGNOSIS — I10 Essential (primary) hypertension: Secondary | ICD-10-CM | POA: Diagnosis not present

## 2024-02-03 DIAGNOSIS — J9621 Acute and chronic respiratory failure with hypoxia: Secondary | ICD-10-CM | POA: Diagnosis not present

## 2024-02-04 DIAGNOSIS — Z1321 Encounter for screening for nutritional disorder: Secondary | ICD-10-CM | POA: Diagnosis not present

## 2024-02-10 DIAGNOSIS — M79675 Pain in left toe(s): Secondary | ICD-10-CM | POA: Diagnosis not present

## 2024-02-10 DIAGNOSIS — B351 Tinea unguium: Secondary | ICD-10-CM | POA: Diagnosis not present

## 2024-02-10 DIAGNOSIS — M79674 Pain in right toe(s): Secondary | ICD-10-CM | POA: Diagnosis not present

## 2024-02-16 DIAGNOSIS — I214 Non-ST elevation (NSTEMI) myocardial infarction: Secondary | ICD-10-CM | POA: Diagnosis not present

## 2024-02-16 DIAGNOSIS — J9621 Acute and chronic respiratory failure with hypoxia: Secondary | ICD-10-CM | POA: Diagnosis not present

## 2024-02-16 DIAGNOSIS — I700 Atherosclerosis of aorta: Secondary | ICD-10-CM | POA: Diagnosis not present

## 2024-02-16 DIAGNOSIS — J439 Emphysema, unspecified: Secondary | ICD-10-CM | POA: Diagnosis not present

## 2024-02-22 DIAGNOSIS — I214 Non-ST elevation (NSTEMI) myocardial infarction: Secondary | ICD-10-CM | POA: Diagnosis not present

## 2024-02-22 DIAGNOSIS — I700 Atherosclerosis of aorta: Secondary | ICD-10-CM | POA: Diagnosis not present

## 2024-02-22 DIAGNOSIS — J439 Emphysema, unspecified: Secondary | ICD-10-CM | POA: Diagnosis not present

## 2024-02-22 DIAGNOSIS — J9621 Acute and chronic respiratory failure with hypoxia: Secondary | ICD-10-CM | POA: Diagnosis not present

## 2024-03-02 DIAGNOSIS — I509 Heart failure, unspecified: Secondary | ICD-10-CM | POA: Diagnosis not present

## 2024-03-02 DIAGNOSIS — J9621 Acute and chronic respiratory failure with hypoxia: Secondary | ICD-10-CM | POA: Diagnosis not present

## 2024-03-02 DIAGNOSIS — J439 Emphysema, unspecified: Secondary | ICD-10-CM | POA: Diagnosis not present

## 2024-03-04 ENCOUNTER — Other Ambulatory Visit: Payer: Self-pay

## 2024-03-04 ENCOUNTER — Emergency Department (HOSPITAL_COMMUNITY)

## 2024-03-04 ENCOUNTER — Encounter (HOSPITAL_COMMUNITY): Payer: Self-pay

## 2024-03-04 ENCOUNTER — Emergency Department (HOSPITAL_COMMUNITY)
Admission: EM | Admit: 2024-03-04 | Discharge: 2024-03-04 | Disposition: A | Discharge status: Home / Self Care | Attending: Emergency Medicine | Admitting: Emergency Medicine

## 2024-03-04 DIAGNOSIS — E041 Nontoxic single thyroid nodule: Secondary | ICD-10-CM | POA: Diagnosis not present

## 2024-03-04 DIAGNOSIS — Z7982 Long term (current) use of aspirin: Secondary | ICD-10-CM | POA: Diagnosis not present

## 2024-03-04 DIAGNOSIS — W19XXXA Unspecified fall, initial encounter: Secondary | ICD-10-CM | POA: Diagnosis not present

## 2024-03-04 DIAGNOSIS — S0003XA Contusion of scalp, initial encounter: Secondary | ICD-10-CM | POA: Diagnosis not present

## 2024-03-04 DIAGNOSIS — S0990XA Unspecified injury of head, initial encounter: Secondary | ICD-10-CM | POA: Diagnosis present

## 2024-03-04 DIAGNOSIS — R531 Weakness: Secondary | ICD-10-CM | POA: Diagnosis not present

## 2024-03-04 MED ORDER — ACETAMINOPHEN 325 MG PO TABS
650.0000 mg | ORAL_TABLET | Freq: Once | ORAL | Status: AC
  Administered 2024-03-04: 325 mg via ORAL
  Filled 2024-03-04: qty 2

## 2024-03-04 NOTE — ED Provider Notes (Signed)
 [place] [facility]MENT AT [facility] Provider Note   CSN: [id number] Arrival date & time: 03/04/24  8151     Patient presents with: [name] is a 88 y.o. female.  She is brought in by ambulance from her facility after a fall.  Reportedly slid out of her wheelchair and hit her head.  History of same.  Occurred about an hour ago.  Has a hematoma on the back of her scalp.  She denies loss of consciousness.  Complaining of head pain.  She has significant cardiac history and is possibly on a blood thinner.  She was last seen here in May for a fall and head injury.  No family available at present.   The history is provided by the patient and the EMS personnel.  Fall This is a new problem. The current episode started 1 to 2 hours ago. The problem has not changed since onset.Associated symptoms include headaches. Pertinent negatives include no chest pain, no abdominal pain and no shortness of breath. Nothing aggravates the symptoms. Nothing relieves the symptoms. She has tried nothing for the symptoms. The treatment provided no relief.       Prior to Admission medications   Medication Sig Start Date End Date Taking? Authorizing Provider  acetaminophen  (TYLENOL ) 325 MG tablet Take 2 tablets (650 mg total) by mouth every 6 (six) hours as needed for mild pain (or Fever >/= 101). Patient taking differently: Take 650 mg by mouth every 4 (four) hours as needed for mild pain (pain score 1-3) (or Fever >/= 101). 11/25/22   Emokpae, Courage, MD  acetaminophen  (TYLENOL ) 500 MG tablet Take 1,000 mg by mouth every 8 (eight) hours as needed for mild pain (pain score 1-3).    [provider]  albuterol  (PROVENTIL ) (2.5 MG/3ML) 0.083% nebulizer solution Take 3 mLs (2.5 mg total) by nebulization every 4 (four) hours as needed for wheezing or shortness of breath. 07/07/23 07/06/24  Pearlean Manus, MD  albuterol  (VENTOLIN  HFA) 108 (90 Base) MCG/ACT inhaler Inhale 1 puff into  the lungs every 4 (four) hours as needed for wheezing or shortness of breath. 07/07/23   Pearlean Manus, MD  amiodarone  (PACERONE ) 200 MG tablet Take 1 tablet (200 mg total) by mouth daily. 03/16/23   Amin, Ankit C, MD  aspirin  EC 81 MG tablet Take 1 tablet (81 mg total) by mouth daily with breakfast. Swallow whole. 07/02/22   Pearlean Manus, MD  atorvastatin  (LIPITOR ) 80 MG tablet Take 1 tablet (80 mg total) by mouth daily. 02/06/23   Debera Jayson MATSU, MD  bisacodyl  (DULCOLAX) 10 MG suppository Place 10 mg rectally daily as needed for moderate constipation.    [provider]  Cyanocobalamin  (VITAMIN B-12) 5000 MCG SUBL Take 5,000 mcg by mouth daily.    [provider]  dicyclomine  (BENTYL ) 10 MG capsule Take 1 capsule (10 mg total) by mouth every 12 (twelve) hours as needed for spasms (abdominal pain). 12/08/22   Eartha Angelia Sieving, MD  divalproex (DEPAKOTE) 250 MG DR tablet Take 250 mg by mouth 2 (two) times daily.    [provider]  folic acid  (FOLVITE ) 1 MG tablet Take 1 tablet (1 mg total) by mouth daily. 11/12/22   Evonnie Lenis, MD  furosemide  (LASIX ) 20 MG tablet Take 1 tablet (20 mg total) by mouth every evening. 07/07/23 07/06/24  Pearlean Manus, MD  furosemide  (LASIX ) 40 MG tablet Take 1 tablet (40 mg total) by mouth daily. 07/07/23  Pearlean Manus, MD  guaifenesin  (ROBITUSSIN) 100 MG/5ML syrup Take 600 mg by mouth 2 (two) times daily.    [provider]  ibuprofen (ADVIL) 600 MG tablet Take 600 mg by mouth every 6 (six) hours as needed. 02/09/24   [provider]  isosorbide  mononitrate (IMDUR ) 30 MG 24 hr tablet Take 1 tablet (30 mg total) by mouth daily. 03/16/23   Amin, Ankit C, MD  LORazepam  (ATIVAN ) 1 MG tablet Take 1 mg by mouth every 8 (eight) hours.    [provider]  melatonin 5 MG TABS Take 5 mg by mouth at bedtime.    [provider]  metoprolol  succinate (TOPROL -XL) 50 MG 24 hr tablet Take 1 tablet (50 mg  total) by mouth 2 (two) times daily. Take with or immediately following a meal. 03/15/23   Amin, Ankit C, MD  Morphine  Sulfate (MORPHINE  CONCENTRATE) 10 mg / 0.5 ml concentrated solution Take 0.25 mLs by mouth every 2 (two) hours as needed for severe pain (pain score 7-10). 11/03/23   [provider]  nitroGLYCERIN  (NITROLINGUAL ) 0.4 MG/SPRAY spray Place 1 spray under the tongue every 5 (five) minutes x 3 doses as needed for chest pain. 07/07/23   [provider]  Nutritional Supplements (NUTRITIONAL SUPPLEMENT PO) Take 237 mLs by mouth in the morning, at noon, and at bedtime.    [provider]  ondansetron  (ZOFRAN ) 4 MG tablet Take 4 mg by mouth every 6 (six) hours as needed for vomiting or nausea.    [provider]  oxyCODONE  (OXY IR/ROXICODONE ) 5 MG immediate release tablet Take 5 mg by mouth every 6 (six) hours as needed. 02/19/24   [provider]  pantoprazole  (PROTONIX ) 20 MG tablet Take 1 tablet (20 mg total) by mouth daily. 04/20/23   Eartha Angelia Sieving, MD  PARoxetine  (PAXIL ) 10 MG tablet Take 20 mg by mouth daily. 06/26/23   [provider]  polyethylene glycol (MIRALAX  / GLYCOLAX ) 17 g packet Take 17 g by mouth 2 (two) times daily.    [provider]  potassium chloride  20 MEQ/15ML (10%) SOLN Take 10 mEq by mouth daily. 02/04/24   [provider]  potassium chloride  SA (KLOR-CON  M) 20 MEQ tablet Take 20 mEq by mouth daily.    [provider]  ranolazine  (RANEXA ) 500 MG 12 hr tablet Take 1 tablet (500 mg total) by mouth 2 (two) times daily. 07/02/22   Pearlean Manus, MD  rOPINIRole (REQUIP) 0.5 MG tablet Take 0.5 mg by mouth daily. 01/20/23   [provider]  ticagrelor  (BRILINTA ) 60 MG TABS tablet Take 1 tablet (60 mg total) by mouth 2 (two) times daily. 07/07/23   Pearlean Manus, MD  vitamin E  180 MG (400 UNITS) capsule Take 400 Units by mouth daily.    [provider]    Allergies:  Amitriptyline hcl, Plavix  [clopidogrel  bisulfate], and Sulfonamide derivatives    Review of Systems  Respiratory:  Negative for shortness of breath.   Cardiovascular:  Negative for chest pain.  Gastrointestinal:  Negative for abdominal pain.  Neurological:  Positive for headaches.    Updated Vital Signs BP (!) 187/85 (BP Location: Left Arm)   Pulse 66   Temp 98.1 F (36.7 C) (Oral)   Resp 18   SpO2 95%   Physical Exam Vitals and nursing note reviewed.  Constitutional:      General: She is not in acute distress.    Appearance: Normal appearance. She is well-developed.  HENT:  Head: Normocephalic.     Comments: She has approximately 3 cm hematoma on the back of her head.  No active bleeding. Eyes:     Conjunctiva/sclera: Conjunctivae normal.  Cardiovascular:     Rate and Rhythm: Normal rate and regular rhythm.     Heart sounds: No murmur heard. Pulmonary:     Effort: Pulmonary effort is normal. No respiratory distress.     Breath sounds: Normal breath sounds. No stridor. No wheezing.  Abdominal:     Palpations: Abdomen is soft.     Tenderness: There is no abdominal tenderness. There is no guarding or rebound.  Musculoskeletal:        General: No tenderness or deformity. Normal range of motion.     Cervical back: Neck supple.  Skin:    General: Skin is warm and dry.  Neurological:     General: No focal deficit present.     Mental Status: She is alert.     GCS: GCS eye subscore is 4. GCS verbal subscore is 5. GCS motor subscore is 6.     Motor: No weakness.     (all labs ordered are listed, but only abnormal results are displayed) Labs Reviewed - No data to display  EKG: None  Radiology: CT Head Wo Contrast Result Date: 03/04/2024 CLINICAL DATA:  Head and neck trauma EXAM: CT HEAD WITHOUT CONTRAST CT CERVICAL SPINE WITHOUT CONTRAST TECHNIQUE: Multidetector CT imaging of the head and cervical spine was performed following the standard protocol without intravenous  contrast. Multiplanar CT image reconstructions of the cervical spine were also generated. RADIATION DOSE REDUCTION: This exam was performed according to the departmental dose-optimization program which includes automated exposure control, adjustment of the mA and/or kV according to patient size and/or use of iterative reconstruction technique. COMPARISON:  CT brain and cervical spine 01/01/2024, 11/18/2023 FINDINGS: CT HEAD FINDINGS Brain: No acute territorial infarction, hemorrhage or intracranial mass. Atrophy and chronic small vessel ischemic changes of the white matter. Small chronic left frontal white matter and basal ganglial infarct. Stable ventricle size Vascular: No hyperdense vessels.  Carotid vascular calcification Skull: Normal. Negative for fracture or focal lesion. Sinuses/Orbits: No acute finding. Other: None CT CERVICAL SPINE FINDINGS Alignment: Trace anterolisthesis C7 on T1, stable. Facet alignment is normal Skull base and vertebrae: No acute fracture. No primary bone lesion or focal pathologic process. Soft tissues and spinal canal: No prevertebral fluid or swelling. No visible canal hematoma. Disc levels: Advanced C1-C2 degenerative changes. Advanced disc space narrowing C3 through C7. Multilevel facet degenerative change with foraminal narrowing. Upper chest: Emphysema. Biapical pleuroparenchymal scarring. Heterogenous thyroid  nodules measuring up to 1.7 cm. Other: None IMPRESSION: 1. No CT evidence for acute intracranial abnormality. Atrophy and chronic small vessel ischemic changes of the white matter. Chronic left frontal white matter and basal ganglial infarct. 2. Advanced degenerative changes of the cervical spine without acute osseous abnormality. 3. Heterogenous thyroid  nodules measuring up to 1.7 cm. Recommend thyroid  US  if life expectancy and comorbidities permit. (Ref: J Am Coll Radiol. 2015 Feb;12(2): 143-50). Electronically Signed   By: Luke Bun M.D.   On: 03/04/2024 19:37    CT Cervical Spine Wo Contrast Result Date: 03/04/2024 CLINICAL DATA:  Head and neck trauma EXAM: CT HEAD WITHOUT CONTRAST CT CERVICAL SPINE WITHOUT CONTRAST TECHNIQUE: Multidetector CT imaging of the head and cervical spine was performed following the standard protocol without intravenous contrast. Multiplanar CT image reconstructions of the cervical spine were also generated. RADIATION DOSE REDUCTION: This exam was performed  according to the departmental dose-optimization program which includes automated exposure control, adjustment of the mA and/or kV according to patient size and/or use of iterative reconstruction technique. COMPARISON:  CT brain and cervical spine 01/01/2024, 11/18/2023 FINDINGS: CT HEAD FINDINGS Brain: No acute territorial infarction, hemorrhage or intracranial mass. Atrophy and chronic small vessel ischemic changes of the white matter. Small chronic left frontal white matter and basal ganglial infarct. Stable ventricle size Vascular: No hyperdense vessels.  Carotid vascular calcification Skull: Normal. Negative for fracture or focal lesion. Sinuses/Orbits: No acute finding. Other: None CT CERVICAL SPINE FINDINGS Alignment: Trace anterolisthesis C7 on T1, stable. Facet alignment is normal Skull base and vertebrae: No acute fracture. No primary bone lesion or focal pathologic process. Soft tissues and spinal canal: No prevertebral fluid or swelling. No visible canal hematoma. Disc levels: Advanced C1-C2 degenerative changes. Advanced disc space narrowing C3 through C7. Multilevel facet degenerative change with foraminal narrowing. Upper chest: Emphysema. Biapical pleuroparenchymal scarring. Heterogenous thyroid  nodules measuring up to 1.7 cm. Other: None IMPRESSION: 1. No CT evidence for acute intracranial abnormality. Atrophy and chronic small vessel ischemic changes of the white matter. Chronic left frontal white matter and basal ganglial infarct. 2. Advanced degenerative changes of the  cervical spine without acute osseous abnormality. 3. Heterogenous thyroid  nodules measuring up to 1.7 cm. Recommend thyroid  US  if life expectancy and comorbidities permit. (Ref: J Am Coll Radiol. 2015 Feb;12(2): 143-50). Electronically Signed   By: Luke Bun M.D.   On: 03/04/2024 19:37     Procedures   Medications Ordered in the ED - No data to display  Clinical Course as of 03/05/24 1028  Fri Mar 04, 2024  1944 I updated patient's legal guardian [name] and she is fine with her returning her back to the facility. [MB]    Clinical Course User Index [MB] Towana Ozell BROCKS, MD                                 Medical Decision Making Amount and/or Complexity of Data Reviewed Radiology: ordered.  Risk OTC drugs.   This patient complains of fall head injury; this involves an extensive number of treatment Options and is a complaint that carries with it a high risk of complications and morbidity. The differential includes fracture, concussion, bleed I ordered medication Tylenol  and reviewed PMP when indicated. I ordered imaging studies which included head and cervical spine CT and I independently    visualized and interpreted imaging which showed no acute findings Additional history obtained from EMS Previous records obtained and reviewed in epic, recent ED visits for fall Cardiac monitoring reviewed, sinus rhythm Social determinants considered, physically inactive Critical Interventions: None  After the interventions stated above, I reevaluated the patient and found patient is awake and nonfocal Admission and further testing considered, no indications for admission.  Will return her back to her facility where they can continue to observe her.  Updated guardian.      Final diagnoses:  Fall, initial encounter  Injury of head, initial encounter  Hematoma of scalp, initial encounter  Thyroid  nodule    ED Discharge Orders     None          Towana Ozell BROCKS,  MD 03/05/24 1029

## 2024-03-04 NOTE — ED Triage Notes (Signed)
 PER EMS: the patient is from [facility] where staff reports she fell and hit her head about one hour ago, per her roommate. She slid down from sitting in her wheelchair and her head hit the floor. She has a knot to the back of her head. No LOC. She is on a blood thinner for Afib but which medication is unknown. She is A&Ox4. + pacemaker   BP- 200/90, HR-71, O2-96% RA

## 2024-03-04 NOTE — Discharge Instructions (Addendum)
 You were seen in the emergency department for evaluation of injuries from a fall.  You had a CAT scan of your head and cervical spine that did not show any acute traumatic findings.  you did have a thyroid  nodule that may need follow-up.

## 2024-03-07 DIAGNOSIS — I700 Atherosclerosis of aorta: Secondary | ICD-10-CM | POA: Diagnosis not present

## 2024-03-07 DIAGNOSIS — I214 Non-ST elevation (NSTEMI) myocardial infarction: Secondary | ICD-10-CM | POA: Diagnosis not present

## 2024-03-07 DIAGNOSIS — J439 Emphysema, unspecified: Secondary | ICD-10-CM | POA: Diagnosis not present

## 2024-03-07 DIAGNOSIS — J9621 Acute and chronic respiratory failure with hypoxia: Secondary | ICD-10-CM | POA: Diagnosis not present

## 2024-03-09 DIAGNOSIS — J439 Emphysema, unspecified: Secondary | ICD-10-CM | POA: Diagnosis not present

## 2024-03-09 DIAGNOSIS — J9621 Acute and chronic respiratory failure with hypoxia: Secondary | ICD-10-CM | POA: Diagnosis not present

## 2024-03-09 DIAGNOSIS — I700 Atherosclerosis of aorta: Secondary | ICD-10-CM | POA: Diagnosis not present

## 2024-03-09 DIAGNOSIS — I214 Non-ST elevation (NSTEMI) myocardial infarction: Secondary | ICD-10-CM | POA: Diagnosis not present

## 2024-03-15 DIAGNOSIS — J439 Emphysema, unspecified: Secondary | ICD-10-CM | POA: Diagnosis not present

## 2024-03-15 DIAGNOSIS — I214 Non-ST elevation (NSTEMI) myocardial infarction: Secondary | ICD-10-CM | POA: Diagnosis not present

## 2024-03-15 DIAGNOSIS — J9621 Acute and chronic respiratory failure with hypoxia: Secondary | ICD-10-CM | POA: Diagnosis not present

## 2024-03-15 DIAGNOSIS — I700 Atherosclerosis of aorta: Secondary | ICD-10-CM | POA: Diagnosis not present

## 2024-03-23 ENCOUNTER — Ambulatory Visit: Admitting: Cardiology

## 2024-03-24 ENCOUNTER — Encounter: Payer: Self-pay | Admitting: Cardiology

## 2024-03-24 ENCOUNTER — Ambulatory Visit: Attending: Cardiology | Admitting: Cardiology

## 2024-03-24 VITALS — BP 140/82 | HR 78 | Ht 65.0 in | Wt 89.6 lb

## 2024-03-24 DIAGNOSIS — Z950 Presence of cardiac pacemaker: Secondary | ICD-10-CM | POA: Diagnosis not present

## 2024-03-24 DIAGNOSIS — I480 Paroxysmal atrial fibrillation: Secondary | ICD-10-CM

## 2024-03-24 DIAGNOSIS — I25119 Atherosclerotic heart disease of native coronary artery with unspecified angina pectoris: Secondary | ICD-10-CM

## 2024-03-24 DIAGNOSIS — K219 Gastro-esophageal reflux disease without esophagitis: Secondary | ICD-10-CM

## 2024-03-24 NOTE — Progress Notes (Signed)
 Cardiology Office Note  Date: 03/24/2024   ID: [name], [name] August 10, 1935, MRN [id number]  History of Present Illness: [name] is an 88 y.o. female last seen in February by Ms. Parthenia RIGGERS, I reviewed her note.  Our last visit was in 2023.  She is here with an Geophysicist/field seismologist, currently resides at [facility] center.  She does not report any angina or increasing nitroglycerin  use.  She has been concerned about easy bruising and some bleeding from her arms on Brilinta .  She is also on aspirin .  In the interim she has been taken off Lipitor  due to hair loss.  I reviewed the remainder of her medications per MAR.  She is followed by PCP at her facility.  Medtronic pacemaker in place with history of sinus node dysfunction.  She follows with Dr. Waddell.  Device interrogation in April revealed normal function.  PAT/PAF noted as before, longest duration event just over 18 minutes.  Physical Exam: VS:  BP (!) 170/84 (BP Location: Left Arm, Patient Position: Sitting, Cuff Size: Normal)   Pulse 78   Ht 5' 5 (1.651 m)   Wt 89 lb 9.6 oz (40.6 kg)   SpO2 92%   BMI 14.91 kg/m , BMI Body mass index is 14.91 kg/m.  Wt Readings from Last 3 Encounters:  03/24/24 89 lb 9.6 oz (40.6 kg)  03/04/24 110 lb (49.9 kg)  11/18/23 182 lb (82.6 kg)    General: Frail-appearing elderly woman in wheelchair. HEENT: Conjunctiva and lids normal. Neck: Supple, no elevated JVP or carotid bruits. Lungs: Clear to auscultation, nonlabored breathing at rest. Cardiac: Regular rate and rhythm, no S3, 2/6 systolic murmur. Extremities: No pitting edema.  ECG:  An ECG dated 08/06/2023 was personally reviewed today and demonstrated:  Possible ventricular paced rhythm with tremor artifact.  Labwork: 07/05/2023: B Natriuretic Peptide 629.0 08/06/2023: Magnesium  2.1 08/10/2023: ALT 14; AST 26; Hemoglobin 11.8; Platelets 276 09/21/2023: BUN 21; Creatinine, Ser 0.99; Potassium 3.6; Sodium 140     Component Value  Date/Time   CHOL 198 04/16/2023 1423   TRIG 152 (H) 04/16/2023 1423   HDL 58 04/16/2023 1423   CHOLHDL 3.4 04/16/2023 1423   VLDL 30 04/16/2023 1423   LDLCALC 110 (H) 04/16/2023 1423   Other Studies Reviewed Today:  Echocardiogram 06/09/2023:  1. Left ventricular ejection fraction, by estimation, is 60 to 65%. The  left ventricle has normal function. The left ventricle has no regional  wall motion abnormalities. There is mild concentric left ventricular  hypertrophy. Left ventricular diastolic  parameters are consistent with Grade II diastolic dysfunction  (pseudonormalization).   2. Right ventricular systolic function is normal. The right ventricular  size is normal. There is mildly elevated pulmonary artery systolic  pressure. The estimated right ventricular systolic pressure is 36.6 mmHg.   3. Left atrial size was severely dilated.   4. The mitral valve is degenerative. Mild to moderate mitral valve  regurgitation. No evidence of mitral stenosis. The mean mitral valve  gradient is 2.0 mmHg. Moderate mitral annular calcification.   5. The aortic valve is tricuspid. Aortic valve regurgitation is mild.  Aortic valve sclerosis/calcification is present, without any evidence of  aortic stenosis. Aortic valve mean gradient measures 5.0 mmHg.   6. The inferior vena cava is normal in size with greater than 50%  respiratory variability, suggesting right atrial pressure of 3 mmHg.   Assessment and Plan:  1.  Multivessel CAD status post complex PCI of the RCA  including shockwave lithotripsy and score flex angioplasty, DES x2 to the mid to distal RCA, and angioplasty alone to the ostial/proximal RCA in-stent restenosis in October 2022.  LVEF 60 to 65% by echocardiogram in November 2024.  No increasing angina at current level of activity.  Plan to stop Brilinta  given easy bruising/bleeding risk and otherwise continue aspirin  81 mg daily.  She is also on Imdur  30 mg daily and Ranexa  500 mg twice  daily.  No longer on statin therapy as discussed above.   2.  Paroxysmal atrial fibrillation with CHA2DS2-VASc score of 5.  She has declined anticoagulation and otherwise remains on amiodarone  for rhythm suppression.   3.  Sinus node dysfunction with Medtronic pacemaker in place and follow-up with Dr. Waddell.  4.  Mitral regurgitation, mild to moderate by echocardiogram in November 2024.  Disposition:  Follow up 6 months.  Signed, Jayson JUDITHANN Sierras, M.D., F.A.C.C. [facility] at [facility]

## 2024-03-24 NOTE — Patient Instructions (Signed)
 Medication Instructions:   Stop Taking Brilinta    *If you need a refill on your cardiac medications before your next appointment, please call your pharmacy*  Lab Work: NONE   If you have labs (blood work) drawn today and your tests are completely normal, you will receive your results only by: MyChart Message (if you have MyChart) OR A paper copy in the mail If you have any lab test that is abnormal or we need to change your treatment, we will call you to review the results.  Testing/Procedures: NONE   Follow-Up: At [facility], you and your health needs are our priority.  As part of our continuing mission to provide you with exceptional heart care, our providers are all part of one team.  This team includes your primary Cardiologist (physician) and Advanced Practice Providers or APPs (Physician Assistants and Nurse Practitioners) who all work together to provide you with the care you need, when you need it.  Your next appointment:   6 month(s)  Provider:   Jayson Sierras, MD    We recommend signing up for the patient portal called MyChart.  Sign up information is provided on this After Visit Summary.  MyChart is used to connect with patients for Virtual Visits (Telemedicine).  Patients are able to view lab/test results, encounter notes, upcoming appointments, etc.  Non-urgent messages can be sent to your provider as well.   To learn more about what you can do with MyChart, go to [email or web address].   Other Instructions Thank you for choosing [facility]!

## 2024-03-29 DIAGNOSIS — R1312 Dysphagia, oropharyngeal phase: Secondary | ICD-10-CM | POA: Diagnosis not present

## 2024-03-29 DIAGNOSIS — J9611 Chronic respiratory failure with hypoxia: Secondary | ICD-10-CM | POA: Diagnosis not present

## 2024-04-01 DIAGNOSIS — J9611 Chronic respiratory failure with hypoxia: Secondary | ICD-10-CM | POA: Diagnosis not present

## 2024-04-01 DIAGNOSIS — R1312 Dysphagia, oropharyngeal phase: Secondary | ICD-10-CM | POA: Diagnosis not present

## 2024-04-06 DIAGNOSIS — I482 Chronic atrial fibrillation, unspecified: Secondary | ICD-10-CM | POA: Diagnosis not present

## 2024-04-06 DIAGNOSIS — J9621 Acute and chronic respiratory failure with hypoxia: Secondary | ICD-10-CM | POA: Diagnosis not present

## 2024-04-06 DIAGNOSIS — J432 Centrilobular emphysema: Secondary | ICD-10-CM | POA: Diagnosis not present

## 2024-04-12 ENCOUNTER — Ambulatory Visit (INDEPENDENT_AMBULATORY_CARE_PROVIDER_SITE_OTHER): Admitting: Gastroenterology

## 2024-05-03 DIAGNOSIS — H04123 Dry eye syndrome of bilateral lacrimal glands: Secondary | ICD-10-CM | POA: Diagnosis not present

## 2024-05-03 DIAGNOSIS — H524 Presbyopia: Secondary | ICD-10-CM | POA: Diagnosis not present

## 2024-05-03 DIAGNOSIS — Z961 Presence of intraocular lens: Secondary | ICD-10-CM | POA: Diagnosis not present

## 2024-05-04 DIAGNOSIS — I509 Heart failure, unspecified: Secondary | ICD-10-CM | POA: Diagnosis not present

## 2024-05-04 DIAGNOSIS — J9621 Acute and chronic respiratory failure with hypoxia: Secondary | ICD-10-CM | POA: Diagnosis not present

## 2024-05-04 DIAGNOSIS — J439 Emphysema, unspecified: Secondary | ICD-10-CM | POA: Diagnosis not present

## 2024-05-06 DIAGNOSIS — R451 Restlessness and agitation: Secondary | ICD-10-CM | POA: Diagnosis not present

## 2024-05-06 DIAGNOSIS — Z515 Encounter for palliative care: Secondary | ICD-10-CM | POA: Diagnosis not present

## 2024-05-06 DIAGNOSIS — J9621 Acute and chronic respiratory failure with hypoxia: Secondary | ICD-10-CM | POA: Diagnosis not present

## 2024-05-18 ENCOUNTER — Encounter (INDEPENDENT_AMBULATORY_CARE_PROVIDER_SITE_OTHER): Payer: Self-pay | Admitting: Gastroenterology

## 2024-07-15 ENCOUNTER — Encounter: Payer: Self-pay | Admitting: Internal Medicine

## 2024-07-15 ENCOUNTER — Ambulatory Visit: Attending: Internal Medicine | Admitting: Internal Medicine

## 2024-07-15 VITALS — BP 158/83 | HR 64 | Ht 65.5 in

## 2024-07-15 DIAGNOSIS — I495 Sick sinus syndrome: Secondary | ICD-10-CM

## 2024-07-15 DIAGNOSIS — I480 Paroxysmal atrial fibrillation: Secondary | ICD-10-CM | POA: Diagnosis not present

## 2024-07-15 LAB — CUP PACEART INCLINIC DEVICE CHECK
Date Time Interrogation Session: 20251212100118
Implantable Lead Connection Status: 753985
Implantable Lead Connection Status: 753985
Implantable Lead Implant Date: 20100526
Implantable Lead Implant Date: 20100526
Implantable Lead Location: 753859
Implantable Lead Location: 753860
Implantable Lead Model: 5076
Implantable Lead Model: 5076
Implantable Pulse Generator Implant Date: 20231213

## 2024-07-15 MED ORDER — AMIODARONE HCL 200 MG PO TABS: ORAL_TABLET | ORAL | 3 refills | Status: AC

## 2024-07-15 NOTE — Progress Notes (Signed)
 HPI [name] returns today for followup. She is a pleasant 88 yo woman with a h/o HTN, persistent atrial fib, symptomatic tachy-brady syndrome s/p PPM insertion. In the interim, she has been in the hospital with an NSTEMI managed conservatively. She has minimal palpitations. She has not had syncope. She has no edema. Her tremor persists. Since her last visit over a year ago, she notes her appetite is down and she has lost weight.  Allergies[1]   Current Outpatient Medications  Medication Sig Dispense Refill   acetaminophen  (TYLENOL ) 325 MG tablet Take 650 mg by mouth every 4 (four) hours as needed.     acetaminophen  (TYLENOL ) 500 MG tablet Take 1,000 mg by mouth every 8 (eight) hours as needed for mild pain (pain score 1-3).     albuterol  (PROVENTIL ) (2.5 MG/3ML) 0.083% nebulizer solution Take 3 mLs (2.5 mg total) by nebulization every 4 (four) hours as needed for wheezing or shortness of breath. 75 mL 2   albuterol  (VENTOLIN  HFA) 108 (90 Base) MCG/ACT inhaler Inhale 1 puff into the lungs every 4 (four) hours as needed for wheezing or shortness of breath. 8 g 1   amiodarone  (PACERONE ) 200 MG tablet Take 1 tablet (200 mg total) by mouth daily.     aspirin  EC 81 MG tablet Take 1 tablet (81 mg total) by mouth daily with breakfast. Swallow whole. 30 tablet 12   bisacodyl  (DULCOLAX) 10 MG suppository Place 10 mg rectally daily as needed for moderate constipation.     Cyanocobalamin  (VITAMIN B-12) 5000 MCG SUBL Take 5,000 mcg by mouth daily.     dicyclomine  (BENTYL ) 10 MG capsule Take 1 capsule (10 mg total) by mouth every 12 (twelve) hours as needed for spasms (abdominal pain). 90 capsule 1   divalproex (DEPAKOTE) 250 MG DR tablet Take 250 mg by mouth 2 (two) times daily.     folic acid  (FOLVITE ) 1 MG tablet Take 1 tablet (1 mg total) by mouth daily.     furosemide  (LASIX ) 20 MG tablet Take 1 tablet (20 mg total) by mouth every evening. 30 tablet 3   guaifenesin  (ROBITUSSIN) 100 MG/5ML  syrup Take 200 mg by mouth 4 (four) times daily as needed.     ibuprofen (ADVIL) 600 MG tablet Take 600 mg by mouth every 6 (six) hours as needed.     isosorbide  mononitrate (IMDUR ) 30 MG 24 hr tablet Take 1 tablet (30 mg total) by mouth daily.     LORazepam  (ATIVAN ) 1 MG tablet Take 1 mg by mouth every 8 (eight) hours.     melatonin 5 MG TABS Take 5 mg by mouth at bedtime.     metoprolol  succinate (TOPROL -XL) 50 MG 24 hr tablet Take 1 tablet (50 mg total) by mouth 2 (two) times daily. Take with or immediately following a meal.     Morphine  Sulfate (MORPHINE  CONCENTRATE) 10 mg / 0.5 ml concentrated solution Take 0.25 mLs by mouth every 2 (two) hours as needed for severe pain (pain score 7-10).     nitroGLYCERIN  (NITROLINGUAL ) 0.4 MG/SPRAY spray Place 1 spray under the tongue every 5 (five) minutes x 3 doses as needed for chest pain.     Nutritional Supplements (NUTRITIONAL SUPPLEMENT PO) Take 237 mLs by mouth in the morning, at noon, and at bedtime.     ondansetron  (ZOFRAN ) 4 MG tablet Take 4 mg by mouth every 6 (six) hours as needed for vomiting or nausea.     oxyCODONE  (OXY IR/ROXICODONE ) 5  MG immediate release tablet Take 5 mg by mouth every 6 (six) hours as needed.     OXYGEN  Inhale 2 L into the lungs daily as needed.     pantoprazole  (PROTONIX ) 20 MG tablet Take 40 mg by mouth daily.     polyethylene glycol (MIRALAX  / GLYCOLAX ) 17 g packet Take 17 g by mouth 3 (three) times daily.     potassium chloride  20 MEQ/15ML (10%) SOLN Take 20 mEq by mouth daily.     ranolazine  (RANEXA ) 500 MG 12 hr tablet Take 1 tablet (500 mg total) by mouth 2 (two) times daily. 60 tablet 8   rOPINIRole (REQUIP) 0.5 MG tablet Take 0.5 mg by mouth daily.     senna (SENOKOT) 8.6 MG tablet Take 1 tablet by mouth daily.     sertraline (ZOLOFT) 25 MG tablet Take 25 mg by mouth daily.     vitamin E  180 MG (400 UNITS) capsule Take 400 Units by mouth daily.     No current facility-administered medications for this visit.      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   Past Surgical History:  Procedure Laterality Date   APPENDECTOMY     BIOPSY  02/24/2022   Procedure: BIOPSY;  Surgeon: Wilhelmenia Aloha Raddle., MD;  Location: [facility];  Service: Gastroenterology;;   CARDIAC CATHETERIZATION  2010   stents x2.   CATARACT EXTRACTION     CHOLECYSTECTOMY     COLONOSCOPY  2011   COLONOSCOPY N/A 10/19/2014   Procedure: COLONOSCOPY;  Surgeon: Claudis RAYMOND Rivet, MD;  Location: [facility];  Service: Endoscopy;  Laterality: N/A;  1030   CORONARY STENT INTERVENTION N/A 05/28/2021   Procedure: CORONARY STENT INTERVENTION;  Surgeon: Anner Alm ORN, MD;  Location: [facility] INVASIVE CV LAB;  Service: Cardiovascular;  Laterality: N/A;   CORONARY ULTRASOUND/IVUS N/A 05/28/2021   Procedure: Intravascular Ultrasound/IVUS;  Surgeon: Anner Alm ORN, MD;  Location: [facility] INVASIVE CV LAB;  Service: Cardiovascular;  Laterality: N/A;   ESOPHAGEAL DILATION N/A 05/02/2020   Procedure: ESOPHAGEAL DILATION;  Surgeon: Rivet Claudis RAYMOND, MD;  Location: [facility];  Service: Gastroenterology;  Laterality: N/A;   ESOPHAGOGASTRODUODENOSCOPY N/A 10/26/2014   Procedure: ESOPHAGOGASTRODUODENOSCOPY (EGD);  Surgeon: Claudis RAYMOND Rivet, MD;   Location: [facility];  Service: Endoscopy;  Laterality: N/A;  230 - Dr. has lunch and learn   ESOPHAGOGASTRODUODENOSCOPY N/A 02/24/2022   Procedure: ESOPHAGOGASTRODUODENOSCOPY (EGD);  Surgeon: Wilhelmenia Aloha Raddle., MD;  Location: [facility];  Service: Gastroenterology;  Laterality: N/A;   ESOPHAGOGASTRODUODENOSCOPY (EGD) WITH PROPOFOL  N/A 05/02/2020   Procedure: ESOPHAGOGASTRODUODENOSCOPY (EGD) WITH PROPOFOL ;  Surgeon: Rivet Claudis RAYMOND, MD;  Location: [facility];  Service: Gastroenterology;  Laterality: N/A;  250   Esophagogastroduodenoscopy with esophageal dilation  2004, 2006, 2007   EUS N/A 02/24/2022   Procedure: UPPER ENDOSCOPIC ULTRASOUND (EUS) LINEAR;  Surgeon: Wilhelmenia Aloha Raddle., MD;  Location: [facility];  Service: Gastroenterology;  Laterality: N/A;   FINE NEEDLE ASPIRATION  02/24/2022   Procedure: FINE NEEDLE ASPIRATION;  Surgeon: Wilhelmenia Aloha Raddle., MD;  Location: [facility];  Service: Gastroenterology;;  red path sent   GIVENS CAPSULE STUDY N/A 10/31/2014   Procedure: GIVENS CAPSULE STUDY;  Surgeon: Claudis RAYMOND Rivet, MD;  Location: [facility];  Service: Endoscopy;  Laterality: N/A;  730 -- pacemaker--needs monitoring--outpatient bed   INSERT / REPLACE / REMOVE PACEMAKER  2010   LEFT HEART CATH AND CORONARY ANGIOGRAPHY N/A 03/21/2021   Procedure: LEFT HEART CATH AND CORONARY ANGIOGRAPHY;  Surgeon: Claudene Victory ORN, MD;  Location: [facility] INVASIVE CV LAB;  Service: Cardiovascular;  Laterality: N/A;   LEFT HEART CATHETERIZATION WITH CORONARY ANGIOGRAM N/A 11/17/2011   Procedure: LEFT HEART CATHETERIZATION WITH CORONARY ANGIOGRAM;  Surgeon: Ozell Fell, MD;  Location: [facility] CATH LAB;  Service: Cardiovascular;  Laterality: N/A;   LEFT HEART CATHETERIZATION WITH CORONARY ANGIOGRAM N/A 09/26/2014   Procedure: LEFT HEART CATHETERIZATION WITH CORONARY ANGIOGRAM;  Surgeon: Alm ORN Clay, MD;  Location: [facility] CATH LAB;  Service: Cardiovascular;  Laterality: N/A;   PPM GENERATOR  CHANGEOUT N/A 07/16/2022   Procedure: PPM GENERATOR CHANGEOUT;  Surgeon: Waddell Danelle ORN, MD;  Location: [facility] INVASIVE CV LAB;  Service: Cardiovascular;  Laterality: N/A;   Right rotator cuff repair     SAVORY DILATION N/A 02/24/2022   Procedure: SAVORY DILATION;  Surgeon: Wilhelmenia Aloha Raddle., MD;  Location: [facility];  Service: Gastroenterology;  Laterality: N/A;   SHOULDER ACROMIOPLASTY Right 05/30/2015   Procedure: RIGHT SHOULDER ACROMIOPLASTY;  Surgeon: Taft FORBES Minerva, MD;  Location: [facility];  Service: Orthopedics;  Laterality: Right;   SHOULDER OPEN ROTATOR CUFF REPAIR Right 05/30/2015   Procedure: OPEN ROTATOR CUFF REPAIR RIGHT SHOULDER;  Surgeon: Taft FORBES Minerva, MD;  Location: [facility];  Service: Orthopedics;  Laterality: Right;   SHOULDER OPEN ROTATOR CUFF REPAIR Left 10/22/2016   Procedure: ROTATOR CUFF REPAIR SHOULDER OPEN;  Surgeon: Taft FORBES Minerva, MD;  Location: [facility];  Service: Orthopedics;  Laterality: Left;   Subxiphoid pericardial window  11/10   VAGINAL HYSTERECTOMY       Family History  Problem Relation Age of Onset   Cancer Mother        Colon    Coronary artery disease Sister    Coronary artery disease Brother    Arthritis Other    Lung disease Other    Asthma Other      Social History   Socioeconomic History   Marital status: Widowed    Spouse name: Not on file   Number of children: 1   Years of education: 8   Highest education level: 8th grade  Occupational History   Occupation: Retired    Associate Professor: RETIRED  Tobacco Use   Smoking status: Former    Current packs/day: 0.00    Average packs/day: 2.0 packs/day for 10.0 years (20.0 ttl pk-yrs)    Types: Cigarettes    Start date: 05/24/1979    Quit date: 05/23/1989    Years since quitting: 35.1    Passive exposure: Past   Smokeless tobacco: Never   Tobacco comments:    Verified by Granddaughter, [name]  Vaping Use   Vaping status:  Never Used  Substance and Sexual Activity    Alcohol use: No    Alcohol/week: 0.0 standard drinks of alcohol   Drug use: No   Sexual activity: Not Currently    Partners: Male  Other Topics Concern   Not on file  Social History Narrative   She lives alone, has adopted daughter.   Social Drivers of Health   Tobacco Use: Medium Risk (07/15/2024)   Patient History    Smoking Tobacco Use: Former    Smokeless Tobacco Use: Never    Passive Exposure: Past  Physicist, Medical Strain: Low Risk (11/05/2022)   Overall Financial Resource Strain (CARDIA)    Difficulty of Paying Living Expenses: Not hard at all  Food Insecurity: No Food Insecurity (07/05/2023)   Hunger Vital Sign    Worried About Running Out of Food in the Last Year: Never true    Ran Out of Food in the Last Year: Never true  Transportation Needs: No Transportation Needs (07/05/2023)   PRAPARE - Administrator, Civil Service (Medical): No    Lack of Transportation (Non-Medical): No  Physical Activity: Inactive (11/05/2022)   Exercise Vital Sign    Days of Exercise per Week: 0 days    Minutes of Exercise per Session: 0 min  Stress: No Stress Concern Present (11/05/2022)   [employer] of Occupational Health - Occupational Stress Questionnaire    Feeling of Stress : Not at all  Social Connections: Moderately Integrated (11/05/2022)   Social Connection and Isolation Panel    Frequency of Communication with Friends and Family: More than three times a week    Frequency of Social Gatherings with Friends and Family: More than three times a week    Attends Religious Services: More than 4 times per year    Active Member of [employer] or Organizations: Yes    Attends Banker Meetings: Never    Marital Status: Widowed  Intimate Partner Violence: Not At Risk (07/05/2023)   Humiliation, Afraid, Rape, and Kick questionnaire    Fear of Current or Ex-Partner: No    Emotionally Abused: No    Physically Abused: No    Sexually Abused: No  Depression (PHQ2-9): Low  Risk (11/05/2022)   Depression (PHQ2-9)    PHQ-2 Score: 0  Alcohol Screen: Low Risk (11/05/2022)   Alcohol Screen    Last Alcohol Screening Score (AUDIT): 0  Housing: Low Risk (07/05/2023)   Housing    Last Housing Risk Score: 0  Utilities: Not At Risk (07/05/2023)   [facility] Utilities    Threatened with loss of utilities: No  Health Literacy: Not on file     BP (!) 158/83 (BP Location: Left Arm, Cuff Size: Normal)   Pulse 64   Ht 5' 5.5 (1.664 m)   SpO2 97%   BMI 14.68 kg/m   Physical Exam:  Well appearing NAD HEENT: Unremarkable Neck:  No JVD, no thyromegally Lymphatics:  No adenopathy Back:  No CVA tenderness Lungs:  Clear HEART:  Regular rate rhythm, no murmurs, no rubs, no clicks Abd:  soft, positive bowel sounds, no organomegally, no rebound, no guarding Ext:  2 plus pulses, no edema, no cyanosis, no clubbing Skin:  No rashes no nodules Neuro:  CN II through XII intact, motor grossly intact  EKG - nsr with AV pacing  DEVICE  Normal device function.  See PaceArt for details.   Assess/Plan:  1.Atrial fib - she is maintaining NSR almost 99% of the time. I have asked  her to reduce her current dose of amio to 200 mg 5 days a week.  2. PPM -her medtronic DDD PM is working normally. She has undergone gen change out and her device is working normally. 3. Coags - she refuses an OAC. 4. HTN - her bp is fairly well controlled. She is encouraged to avoid salty foods. 5. NSTEMI - she is off brilinta . She will continue ASA   Danelle Ixchel Duck,MD    [1]  Allergies Allergen Reactions   Amitriptyline Hcl Other (See Comments)    Caused jaws to twist and lock   Plavix  [Clopidogrel  Bisulfate] Hives   Sulfonamide Derivatives Other (See Comments)    UNKNOWN REACTION

## 2024-07-15 NOTE — Patient Instructions (Signed)
 Medication Instructions:   DECREASE Amiodarone  to 200 mg Monday thru Friday, NONE on Saturday or Sunday  Labwork: None today  Testing/Procedures: None today  Follow-Up: 1 year with Dr.Carlisle   Any Other Special Instructions Will Be Listed Below (If Applicable).  If you need a refill on your cardiac medications before your next appointment, please call your pharmacy.
